# Patient Record
Sex: Female | Born: 1960
Health system: Southern US, Community
[De-identification: ages and names within clinical notes are randomized; demographics above are authoritative.]

## PROBLEM LIST (undated history)

## (undated) DIAGNOSIS — Z808 Family history of malignant neoplasm of other organs or systems: Secondary | ICD-10-CM

## (undated) DIAGNOSIS — Z9889 Other specified postprocedural states: Secondary | ICD-10-CM

## (undated) DIAGNOSIS — R112 Nausea with vomiting, unspecified: Secondary | ICD-10-CM

## (undated) DIAGNOSIS — F32A Depression, unspecified: Secondary | ICD-10-CM

## (undated) DIAGNOSIS — E039 Hypothyroidism, unspecified: Secondary | ICD-10-CM

## (undated) DIAGNOSIS — Z923 Personal history of irradiation: Secondary | ICD-10-CM

## (undated) DIAGNOSIS — I499 Cardiac arrhythmia, unspecified: Secondary | ICD-10-CM

## (undated) DIAGNOSIS — E079 Disorder of thyroid, unspecified: Secondary | ICD-10-CM

## (undated) DIAGNOSIS — Z803 Family history of malignant neoplasm of breast: Secondary | ICD-10-CM

## (undated) DIAGNOSIS — F419 Anxiety disorder, unspecified: Secondary | ICD-10-CM

## (undated) DIAGNOSIS — O009 Unspecified ectopic pregnancy without intrauterine pregnancy: Secondary | ICD-10-CM

## (undated) HISTORY — PX: ENDOMETRIAL ABLATION: SHX621

## (undated) HISTORY — DX: Family history of malignant neoplasm of breast: Z80.3

## (undated) HISTORY — PX: APPENDECTOMY: SHX54

## (undated) HISTORY — PX: BILATERAL SALPINGECTOMY: SHX5743

## (undated) HISTORY — DX: Family history of malignant neoplasm of other organs or systems: Z80.8

## (undated) HISTORY — PX: GLAUCOMA SURGERY: SHX656

## (undated) HISTORY — DX: Disorder of thyroid, unspecified: E07.9

## (undated) HISTORY — PX: EYE SURGERY: SHX253

---

## 1988-08-28 HISTORY — PX: BREAST EXCISIONAL BIOPSY: SUR124

## 1991-08-29 HISTORY — PX: BREAST SURGERY: SHX581

## 2012-08-28 HISTORY — PX: BREAST BIOPSY: SHX20

## 2017-04-04 ENCOUNTER — Encounter: Payer: Self-pay | Admitting: Family Medicine

## 2017-04-04 ENCOUNTER — Ambulatory Visit (INDEPENDENT_AMBULATORY_CARE_PROVIDER_SITE_OTHER): Payer: 59 | Admitting: Family Medicine

## 2017-04-04 VITALS — BP 142/76 | HR 88 | Temp 98.7°F | Ht 63.5 in | Wt 117.2 lb

## 2017-04-04 DIAGNOSIS — Z720 Tobacco use: Secondary | ICD-10-CM

## 2017-04-04 DIAGNOSIS — N951 Menopausal and female climacteric states: Secondary | ICD-10-CM | POA: Diagnosis not present

## 2017-04-04 DIAGNOSIS — Z7689 Persons encountering health services in other specified circumstances: Secondary | ICD-10-CM

## 2017-04-04 DIAGNOSIS — Z23 Encounter for immunization: Secondary | ICD-10-CM

## 2017-04-04 DIAGNOSIS — F1010 Alcohol abuse, uncomplicated: Secondary | ICD-10-CM | POA: Diagnosis not present

## 2017-04-04 DIAGNOSIS — F17200 Nicotine dependence, unspecified, uncomplicated: Secondary | ICD-10-CM | POA: Insufficient documentation

## 2017-04-04 DIAGNOSIS — E039 Hypothyroidism, unspecified: Secondary | ICD-10-CM | POA: Insufficient documentation

## 2017-04-04 NOTE — Patient Instructions (Addendum)
It was a pleasure to meet you today! I look forward to partnering with you for your health care needs   [facility]  [facility] in [place]  Follow up for your annual exam in February.

## 2017-04-04 NOTE — Progress Notes (Signed)
Subjective:    Patient ID: Martha [name], female    DOB: 1960-10-12, 55 y.o.   MRN: [id number]  HPI This is a 56 yo female who presents today to establish care. Her mother is a patient as well.  Had colonoscopy ([facility]) 2012- normal. She remarried this spring and moved from [place] to [place]. Her mother moved in with her last month. She feels like it is going well. She loves to cook, arts and crafts, yoga.   Menopausal symptoms fairly well controlled on current meds.   Smokes <1 ppd, had stopped for 4 years. Drinks 4-6 Michel[employer] Ultra.   Past Medical History:  Diagnosis Date  . Heart murmur   . Thyroid disease    Past Surgical History:  Procedure Laterality Date  . APPENDECTOMY    . BREAST SURGERY    . EYE SURGERY     Family History  Problem Relation Age of Onset  . Heart disease Mother   . Hyperlipidemia Sister    Social History  Substance Use Topics  . Smoking status: Current Every Day Smoker  . Smokeless tobacco: Never Used  . Alcohol use Yes     Comment: 30 drinks per week        Review of Systems  Constitutional: Negative for unexpected weight change.  Respiratory: Negative for cough, chest tightness and shortness of breath.   Cardiovascular: Negative for chest pain, palpitations and leg swelling.  Genitourinary: Negative for vaginal bleeding.  Psychiatric/Behavioral: Negative for dysphoric mood and sleep disturbance. The patient is not nervous/anxious.        Objective:   Physical Exam Physical Exam  Constitutional: Oriented to person, place, and time. She appears well-developed and well-nourished.  HENT:  Head: Normocephalic and atraumatic.  Eyes: Conjunctivae are normal.  Neck: Normal range of motion. Neck supple.  Cardiovascular: Normal rate, regular rhythm and normal heart sounds.   Pulmonary/Chest: Effort normal and breath sounds normal.  Musculoskeletal: Normal range of motion. No edema.  Neurological: Alert and oriented to person,  place, and time.  Skin: Skin is warm and dry.  Psychiatric: Normal mood and affect. Behavior is normal. Judgment and thought content normal.  Vitals reviewed.    BP (!) 148/86 (BP Location: Right Arm, Patient Position: Sitting, Cuff Size: Normal)   Pulse 88   Temp 98.7 F (37.1 C) (Oral)   Ht 5' 3.5" (1.613 m)   Wt 117 lb 3.2 oz (53.2 kg)   SpO2 98%   BMI 20.44 kg/m   Recheck bp- 142/76  Depression screen PHQ 2/9 04/04/2017  Decreased Interest 0  Down, Depressed, Hopeless 0  PHQ - 2 Score 0       Assessment & Plan:  1. Encounter to establish care - Discussed and encouraged healthy lifestyle choices- adequate sleep, regular exercise, stress management and healthy food choices.  - she will get Shingrix through her pharmacy  2. Need for Tdap vaccination - Tdap vaccine greater than or equal to 7yo IM  3. Menopausal symptoms - currently well controlled on paxil and combipatch  4. Hypothyroidism, unspecified type - had TSH checked 2/18, normal per patient, stay on same dose of synthroid  5. Tobacco abuse - discussed smoking cessation and she is contemplating stopping smoking again - encouraged her to stop  6. Excessive drinking of alcohol - discussed recommended daily intake of alcohol of 1 per day (if she is drinking low alcohol beer, can have 2), encouraged her to not exceed recommended amount and discussed possible risks  of excessive intake (liver disease, poor sleep)  - follow up in 6 months for CPE  Clarene Reamer, FNP-BC  [facility] [place], [place]  04/04/2017 9:23 AM

## 2017-06-04 ENCOUNTER — Ambulatory Visit (INDEPENDENT_AMBULATORY_CARE_PROVIDER_SITE_OTHER): Payer: 59 | Admitting: *Deleted

## 2017-06-04 DIAGNOSIS — Z23 Encounter for immunization: Secondary | ICD-10-CM | POA: Diagnosis not present

## 2017-08-28 DIAGNOSIS — C801 Malignant (primary) neoplasm, unspecified: Secondary | ICD-10-CM

## 2017-08-28 HISTORY — DX: Malignant (primary) neoplasm, unspecified: C80.1

## 2017-09-26 DIAGNOSIS — H40033 Anatomical narrow angle, bilateral: Secondary | ICD-10-CM | POA: Diagnosis not present

## 2017-09-26 DIAGNOSIS — H2513 Age-related nuclear cataract, bilateral: Secondary | ICD-10-CM | POA: Diagnosis not present

## 2017-10-02 ENCOUNTER — Encounter: Payer: Self-pay | Admitting: Family Medicine

## 2017-10-02 NOTE — Telephone Encounter (Signed)
Pt has not had any recent labs. pls advise

## 2017-10-03 ENCOUNTER — Other Ambulatory Visit: Payer: Self-pay | Admitting: Family Medicine

## 2017-10-03 MED ORDER — LEVOTHYROXINE SODIUM 75 MCG PO TABS
75.0000 ug | ORAL_TABLET | Freq: Every day | ORAL | 0 refills | Status: DC
Start: 2017-10-03 — End: 2018-04-23

## 2017-10-11 ENCOUNTER — Ambulatory Visit: Payer: 59 | Admitting: Family Medicine

## 2017-10-22 ENCOUNTER — Encounter: Payer: Self-pay | Admitting: Family Medicine

## 2017-10-22 ENCOUNTER — Ambulatory Visit (INDEPENDENT_AMBULATORY_CARE_PROVIDER_SITE_OTHER): Payer: 59 | Admitting: Family Medicine

## 2017-10-22 VITALS — BP 128/74 | HR 87 | Temp 98.6°F | Ht 63.5 in | Wt 125.8 lb

## 2017-10-22 DIAGNOSIS — E039 Hypothyroidism, unspecified: Secondary | ICD-10-CM | POA: Diagnosis not present

## 2017-10-22 DIAGNOSIS — Z1159 Encounter for screening for other viral diseases: Secondary | ICD-10-CM

## 2017-10-22 DIAGNOSIS — Z114 Encounter for screening for human immunodeficiency virus [HIV]: Secondary | ICD-10-CM | POA: Diagnosis not present

## 2017-10-22 DIAGNOSIS — F17200 Nicotine dependence, unspecified, uncomplicated: Secondary | ICD-10-CM

## 2017-10-22 DIAGNOSIS — N951 Menopausal and female climacteric states: Secondary | ICD-10-CM

## 2017-10-22 LAB — TSH: TSH: 0.79 u[IU]/mL (ref 0.35–4.50)

## 2017-10-22 NOTE — Progress Notes (Signed)
   Subjective:  Martha [name] is a 57 y.o. female who presents today with a chief complaint of hypothyroidism.   HPI:  Hypothyroidism, established problem, stable Currently on Synthroid 75 mcg daily.  Tolerates this dose well without side effects.  No hair or skin changes.  No constipation or diarrhea.  Menopausal symptoms, established problem, stable Currently on Paxil.  Recently stopped hormone replacement.  Symptoms are stable.  No obvious side effects from Paxil.  Nicotine dependence, established problem Smokes about half a pack per day.  She has tried quitting in the past.  She has successfully quit cold [place] before.  She has tried Chantix which did not work well.  ROS: Per HPI  Objective:  Physical Exam: BP 128/74 (BP Location: Left Arm, Patient Position: Sitting, Cuff Size: Normal)   Pulse 87   Temp 98.6 F (37 C) (Oral)   Ht 5' 3.5" (1.613 m)   Wt 125 lb 12.8 oz (57.1 kg)   SpO2 99%   BMI 21.93 kg/m   Gen: NAD, resting comfortably CV: RRR with no murmurs appreciated Pulm: NWOB, CTAB with no crackles, wheezes, or rhonchi  Assessment/Plan:  Hypothyroidism Continue Synthroid.  Check TSH today.  Menopausal symptom Continue Paxil.  Would avoid hormone replacement given her age and smoking status.  Nicotine dependence with current use Patient was asked about her tobacco use today and was strongly advised to quit. Patient is currently contemplative. We reviewed treatment options to assist her quit smoking including NRT, Chantix, and Bupropion.  She declined pharmacotherapy today.  Follow up at next office visit.  Total time spent counseling approximately 4 minutes.   Preventative healthcare Check HIV and hepatitis C antibodies today.  [name]. [name], MD 10/22/2017 11:02 AM

## 2017-10-22 NOTE — Assessment & Plan Note (Signed)
Patient was asked about her tobacco use today and was strongly advised to quit. Patient is currently contemplative. We reviewed treatment options to assist her quit smoking including NRT, Chantix, and Bupropion.  She declined pharmacotherapy today.  Follow up at next office visit.  Total time spent counseling approximately 4 minutes.

## 2017-10-22 NOTE — Assessment & Plan Note (Signed)
Continue Synthroid. Check TSH today. 

## 2017-10-22 NOTE — Patient Instructions (Signed)
We will check blood work today. We will not make any medication changes today.  Please let me know if you would like to start wellbutrin to help you stop smoking.  I would like to see you at least once yearly. Please bring the results of your latest blood work when you are able.  Take care, Dr [name]

## 2017-10-22 NOTE — Assessment & Plan Note (Signed)
Continue Paxil.  Would avoid hormone replacement given her age and smoking status.

## 2017-10-23 LAB — HEPATITIS C ANTIBODY
Hepatitis C Ab: NONREACTIVE
SIGNAL TO CUT-OFF: 0.01 (ref <1.00)

## 2017-10-23 LAB — HIV ANTIBODY (ROUTINE TESTING W REFLEX): HIV 1&2 Ab, 4th Generation: NONREACTIVE

## 2017-10-23 NOTE — Progress Notes (Signed)
Dr Marigene Ehlers interpretation of your lab work:  Your thyroid level is normal. We do not need to make any medication changes. Your HIV and hepatitis C tests were negative.    If you have any additional questions, please give [place] a call or send [place] a message through [place].  Take care, Dr [name]

## 2017-12-04 ENCOUNTER — Encounter: Payer: Self-pay | Admitting: Family Medicine

## 2017-12-05 ENCOUNTER — Other Ambulatory Visit: Payer: Self-pay | Admitting: Family Medicine

## 2017-12-05 ENCOUNTER — Other Ambulatory Visit: Payer: Self-pay

## 2017-12-05 DIAGNOSIS — N63 Unspecified lump in unspecified breast: Secondary | ICD-10-CM

## 2017-12-26 ENCOUNTER — Other Ambulatory Visit: Payer: 59

## 2018-01-15 ENCOUNTER — Ambulatory Visit
Admission: RE | Admit: 2018-01-15 | Discharge: 2018-01-15 | Disposition: A | Discharge status: Home / Self Care | Payer: 59 | Source: Ambulatory Visit | Attending: Family Medicine | Admitting: Family Medicine

## 2018-01-15 ENCOUNTER — Other Ambulatory Visit: Payer: Self-pay | Admitting: Family Medicine

## 2018-01-15 DIAGNOSIS — N63 Unspecified lump in unspecified breast: Secondary | ICD-10-CM

## 2018-01-15 DIAGNOSIS — R921 Mammographic calcification found on diagnostic imaging of breast: Secondary | ICD-10-CM

## 2018-01-15 DIAGNOSIS — R922 Inconclusive mammogram: Secondary | ICD-10-CM | POA: Diagnosis not present

## 2018-01-15 DIAGNOSIS — N6324 Unspecified lump in the left breast, lower inner quadrant: Secondary | ICD-10-CM | POA: Diagnosis not present

## 2018-01-15 IMAGING — MG DIGITAL DIAGNOSTIC BILATERAL MAMMOGRAM WITH TOMO AND CAD
8 of 16 series · 8 of 40 positions shown · non-contrast
Comparison: Previous exam(s).

ADDENDUM:
For addition of recommendations:

Recommend breast MRI for further evaluation of the left breast after
biopsy of the above recommended areas.
CLINICAL DATA: Patient presents for palpable abnormality within the
anterior left breast.
EXAM:
DIGITAL DIAGNOSTIC BILATERAL MAMMOGRAM WITH CAD AND TOMO
ULTRASOUND LEFT BREAST

[L ML (1 of 2)]
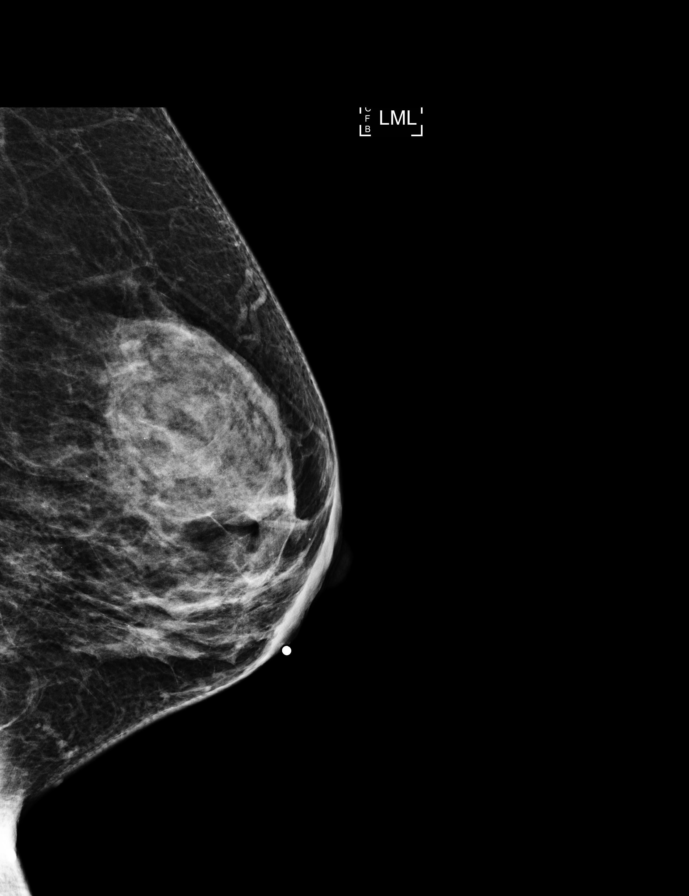

[L CC]
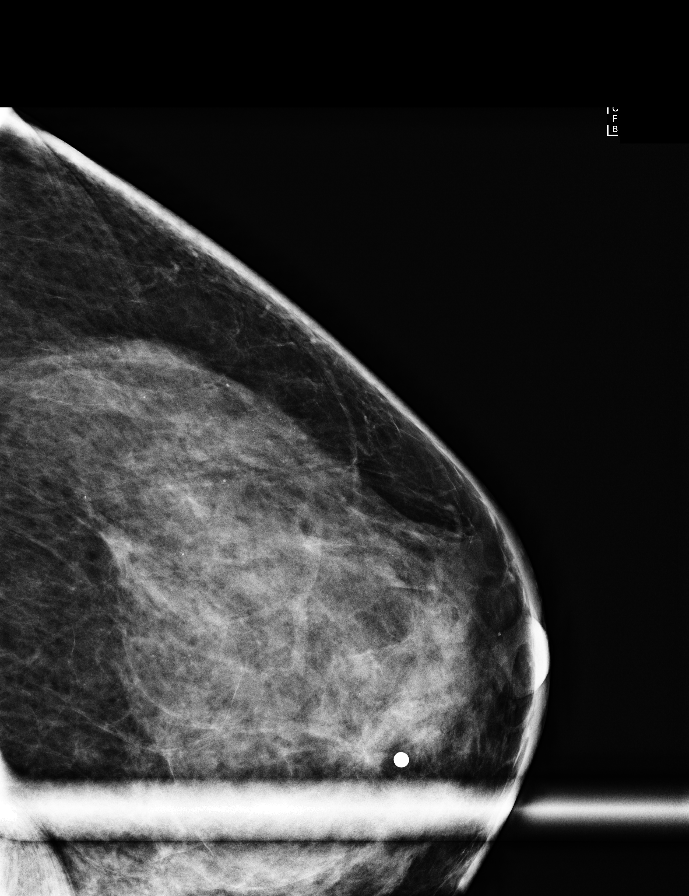

[L ML (2 of 2)]
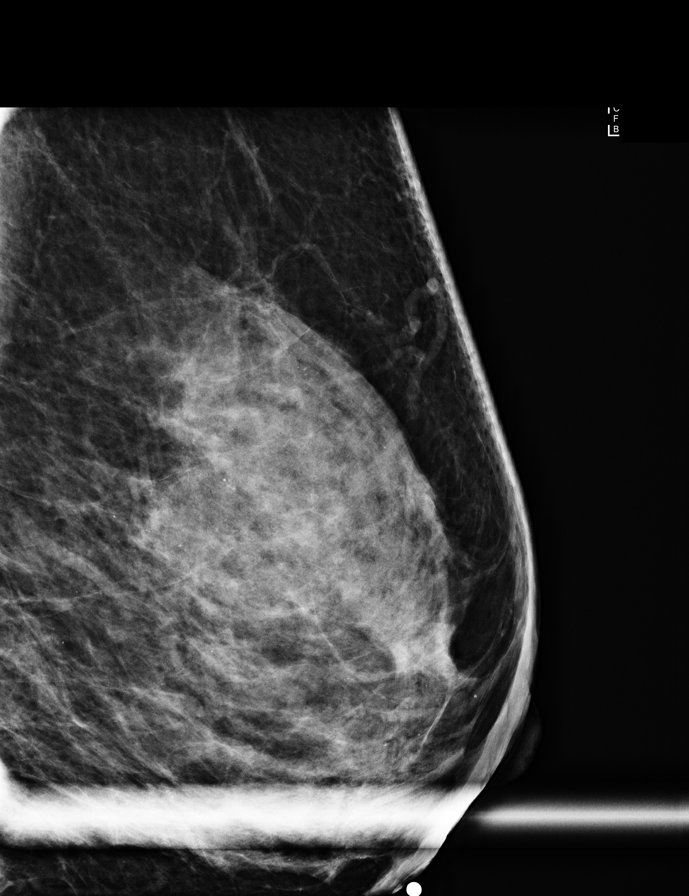

[R MLO synth-2D]
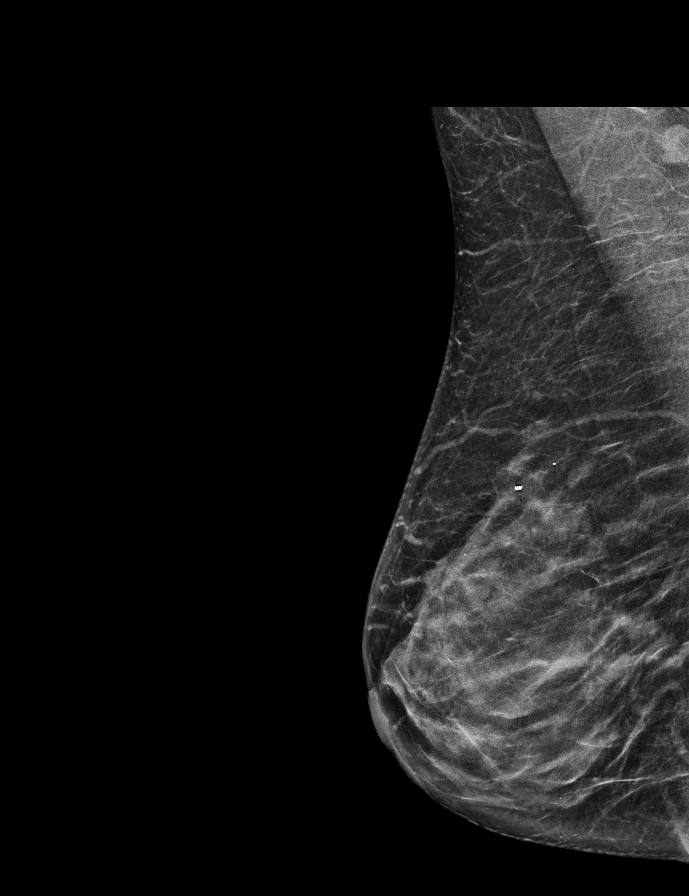

[L CC synth-2D (1 of 2)]
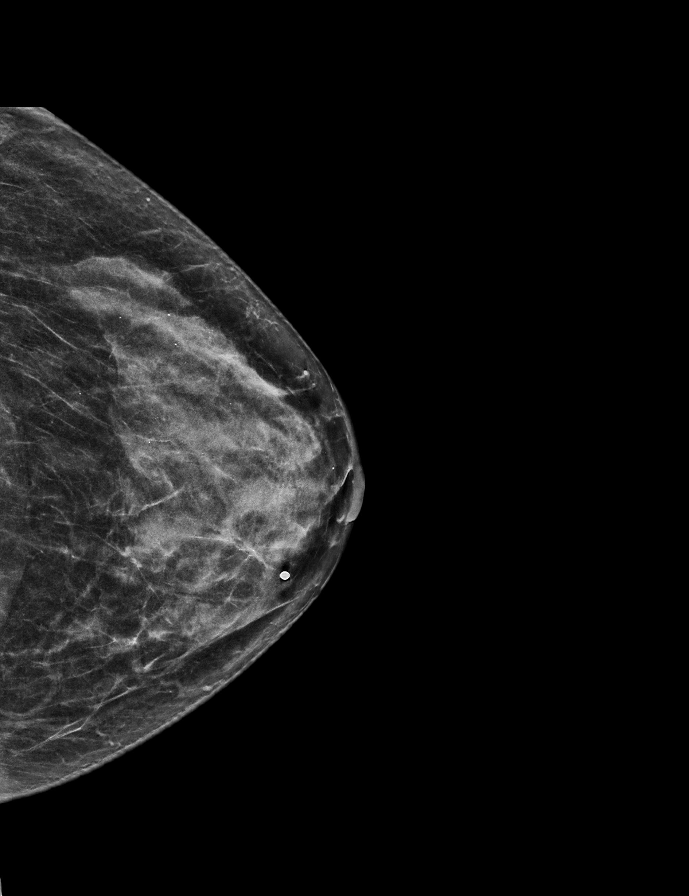

[L CC synth-2D (2 of 2)]
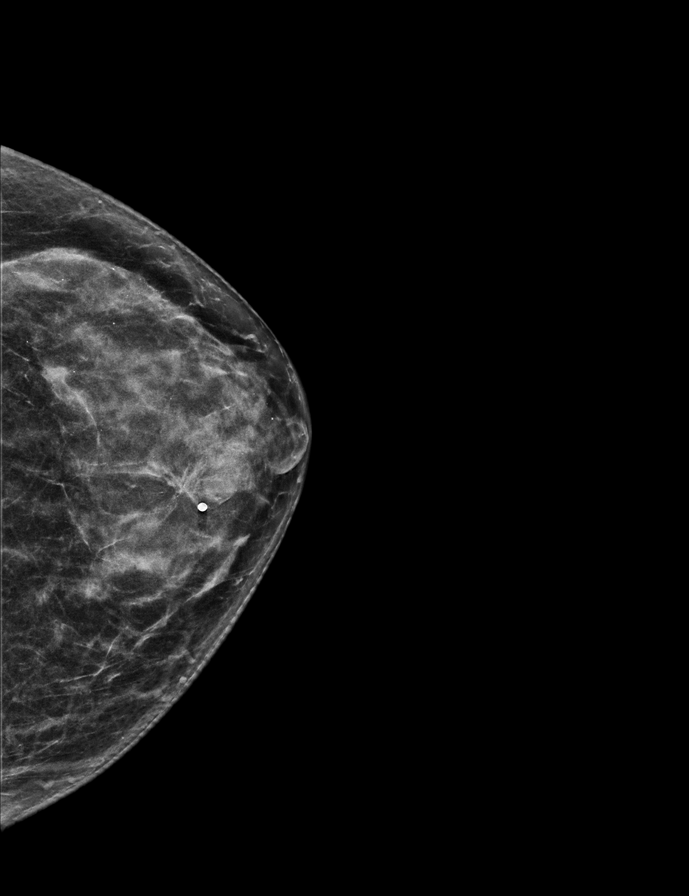

[L TAN synth-2D]
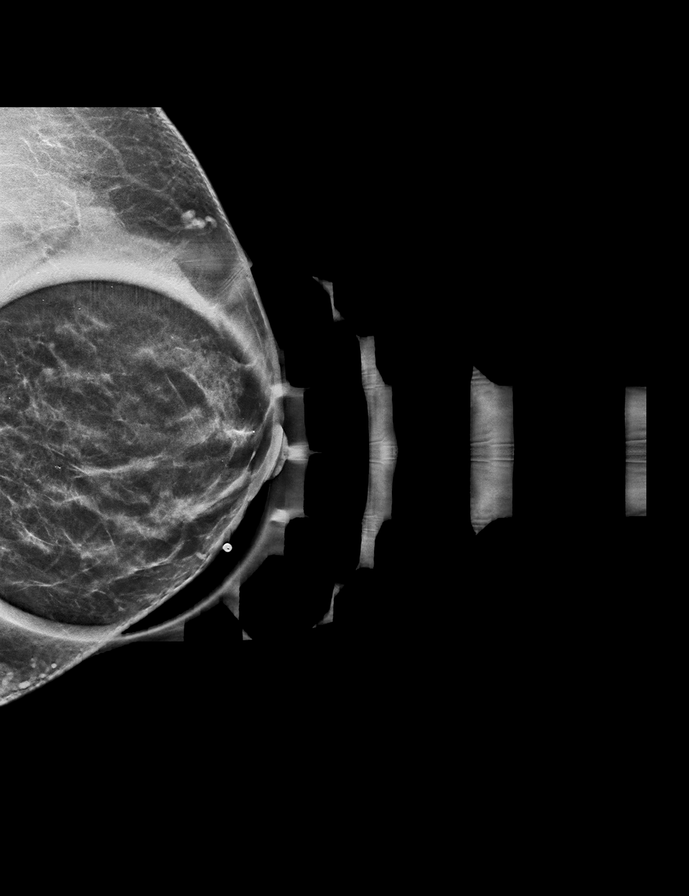

[R CC synth-2D]
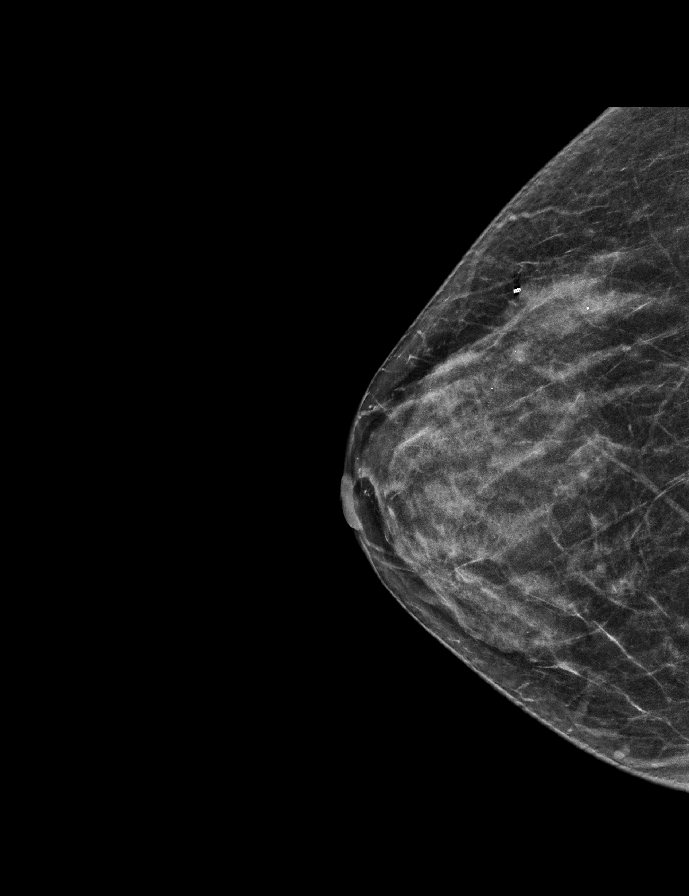

[8 of 40 positions shown; findings below may reference images not displayed]

ACR Breast Density Category c: The breast tissue is heterogeneously
dense, which may obscure small masses.
FINDINGS: Underlying the palpable marker within the anterior slightly medial
left breast is a focal area of distortion. Within the lateral aspect
of the left breast there is a 7 mm group of linearly oriented fine
punctate calcifications, these are best demonstrated on the CC view.
There are additional scattered loosely grouped calcifications
throughout the left and right breast.

Mammographic images were processed with CAD.

On physical exam, I palpate a firm mass within the left breast 7
o'clock position.

Targeted ultrasound is performed, showing a 1.2 x 1.4 x 1.3 cm mixed
echogenicity mass left breast 7 o'clock position retroareolar
location at the site of palpable concern.

Within the left breast 7:30 o'clock retroareolar location, adjacent
to the palpable mass, is a 0.5 x 0.4 x 0.5 cm oval circumscribed
hypoechoic mass.

No left axillary adenopathy.
IMPRESSION: 1. Suspicious mass left breast 7 o'clock position corresponding with
palpable abnormality.
2. Indeterminate hypoechoic left breast mass 7:30 o'clock,
potentially representing a complicated cyst.
3. Indeterminate calcifications within the lateral left breast.
Location of these calcifications is not definitely confirmed on the
true lateral view.

RECOMMENDATION:
1. Ultrasound-guided core needle biopsy suspicious left breast mass
7 o'clock position.
2. Ultrasound-guided aspiration versus core needle biopsy left
breast mass 7:30 o'clock.
3. Stereotactic guided core needle biopsy indeterminate left breast
calcifications laterally. Recommend biopsy from a CC approach as the
exact location on the true lateral view is indeterminate.

I have discussed the findings and recommendations with the patient.
Results were also provided in writing at the conclusion of the
visit. If applicable, a reminder letter will be sent to the patient
regarding the next appointment.

BI-RADS CATEGORY  4: Suspicious.

## 2018-01-16 ENCOUNTER — Ambulatory Visit
Admission: RE | Admit: 2018-01-16 | Discharge: 2018-01-16 | Disposition: A | Discharge status: Home / Self Care | Payer: 59 | Source: Ambulatory Visit | Attending: Family Medicine | Admitting: Family Medicine

## 2018-01-16 DIAGNOSIS — N63 Unspecified lump in unspecified breast: Secondary | ICD-10-CM

## 2018-01-16 DIAGNOSIS — C50312 Malignant neoplasm of lower-inner quadrant of left female breast: Secondary | ICD-10-CM | POA: Diagnosis not present

## 2018-01-16 DIAGNOSIS — N6342 Unspecified lump in left breast, subareolar: Secondary | ICD-10-CM | POA: Diagnosis not present

## 2018-01-16 DIAGNOSIS — N6012 Diffuse cystic mastopathy of left breast: Secondary | ICD-10-CM | POA: Diagnosis not present

## 2018-01-16 DIAGNOSIS — R921 Mammographic calcification found on diagnostic imaging of breast: Secondary | ICD-10-CM

## 2018-01-16 HISTORY — PX: BREAST BIOPSY: SHX20

## 2018-01-18 ENCOUNTER — Other Ambulatory Visit: Payer: Self-pay | Admitting: Family Medicine

## 2018-01-18 ENCOUNTER — Telehealth: Payer: Self-pay | Admitting: Hematology

## 2018-01-18 ENCOUNTER — Other Ambulatory Visit: Payer: Self-pay

## 2018-01-18 ENCOUNTER — Encounter: Payer: Self-pay | Admitting: *Deleted

## 2018-01-18 DIAGNOSIS — R921 Mammographic calcification found on diagnostic imaging of breast: Secondary | ICD-10-CM

## 2018-01-18 NOTE — Telephone Encounter (Signed)
Spoke with patient to confirm afternoon [facility] appointment on 5/29, packet emailed to patient

## 2018-01-22 ENCOUNTER — Other Ambulatory Visit: Payer: Self-pay | Admitting: Family Medicine

## 2018-01-22 ENCOUNTER — Other Ambulatory Visit: Payer: Self-pay | Admitting: *Deleted

## 2018-01-22 ENCOUNTER — Ambulatory Visit
Admission: RE | Admit: 2018-01-22 | Discharge: 2018-01-22 | Disposition: A | Discharge status: Home / Self Care | Payer: 59 | Source: Ambulatory Visit | Attending: Family Medicine | Admitting: Family Medicine

## 2018-01-22 ENCOUNTER — Other Ambulatory Visit: Payer: Self-pay | Admitting: General Surgery

## 2018-01-22 DIAGNOSIS — R921 Mammographic calcification found on diagnostic imaging of breast: Secondary | ICD-10-CM

## 2018-01-22 DIAGNOSIS — C50312 Malignant neoplasm of lower-inner quadrant of left female breast: Secondary | ICD-10-CM

## 2018-01-22 DIAGNOSIS — Z170 Estrogen receptor positive status [ER+]: Principal | ICD-10-CM

## 2018-01-22 DIAGNOSIS — D242 Benign neoplasm of left breast: Secondary | ICD-10-CM | POA: Diagnosis not present

## 2018-01-22 HISTORY — PX: BREAST LUMPECTOMY: SHX2

## 2018-01-22 IMAGING — MG STEREOTACTIC CORE NEEDLE BIOPSY
8 series · 8 of 20 positions shown · non-contrast
Comparison: Previous exams.

ADDENDUM:
Pathology revealed FIBROADENOMA WITH CALCIFICATIONS, FIBROCYSTIC
CHANGES WITH ADENOSIS AND CALCIFICATIONS of the Left breast, upper
outer (x clip). This was found to be concordant by Dr. PIATT
PIATT.

Pathology results were discussed with the patient by telephone. The
patient reported doing well after the biopsy with tenderness at the
site. Post biopsy instructions and care were reviewed and questions
were answered. The patient was encouraged to call The [REDACTED]
The patient has a recent diagnosis of left breast cancer and should
follow her outlined treatment plan. The patient was referred to [REDACTED] [REDACTED] at [REDACTED] on [DATE].
Pathology results reported by PIATT, RN on [DATE].
CLINICAL DATA: Patient presents for stereotactic guided core biopsy
of LEFT breast calcifications. Recent diagnosis of grade 1-2
invasive mammary carcinoma in the 7 o'clock location of the LEFT
breast.
EXAM:
LEFT BREAST STEREOTACTIC CORE NEEDLE BIOPSY

[L ML]
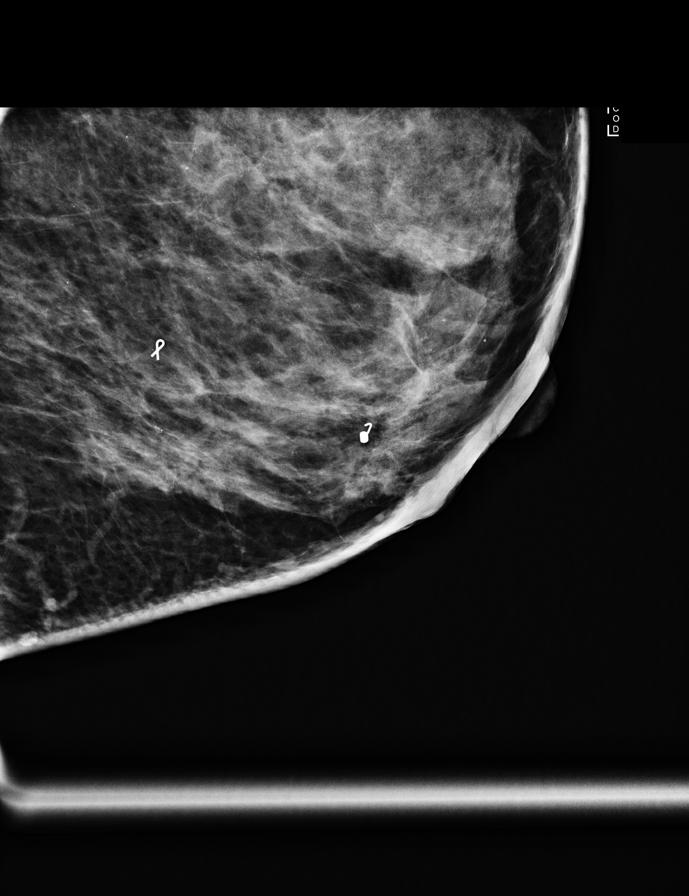

[L CC (1 of 4)]
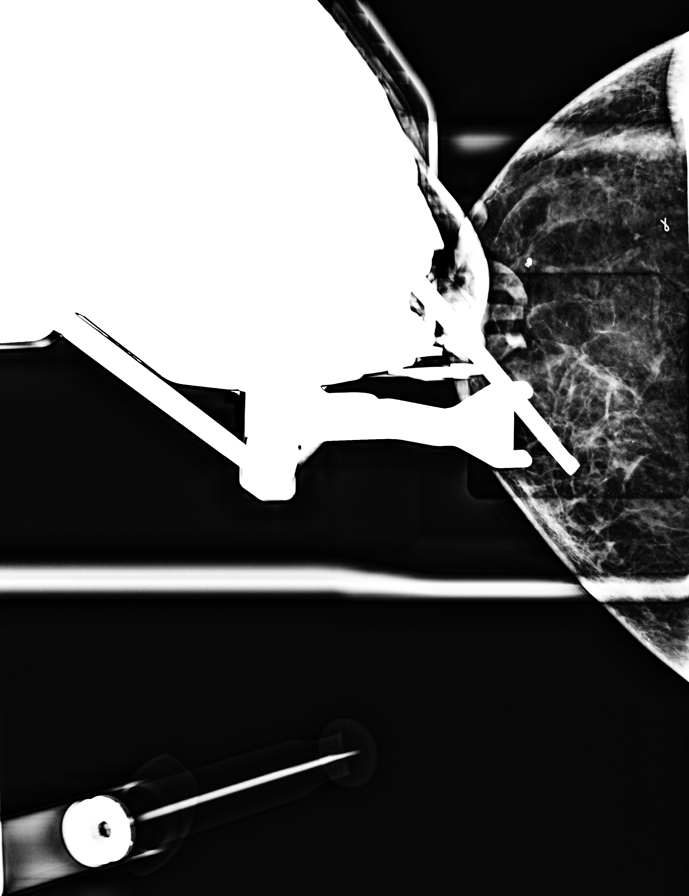

[L CC (2 of 4)]
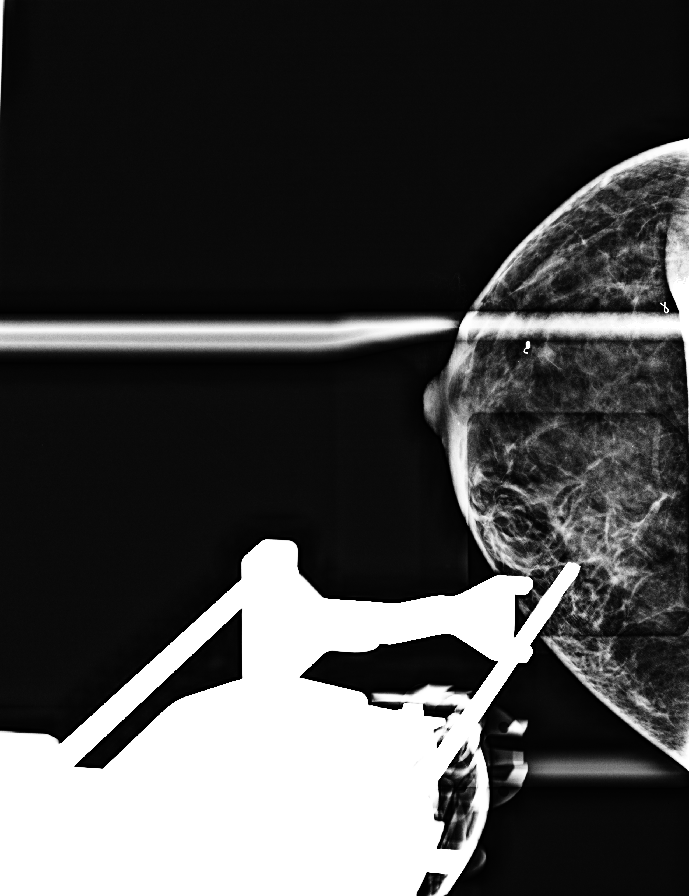

[L CC (3 of 4)]
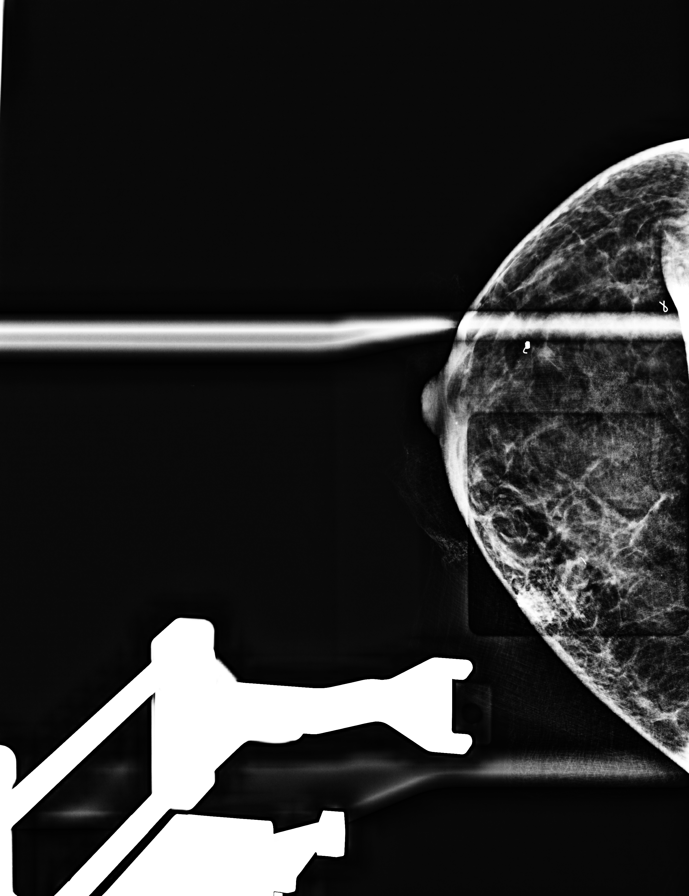

[L CC (4 of 4)]
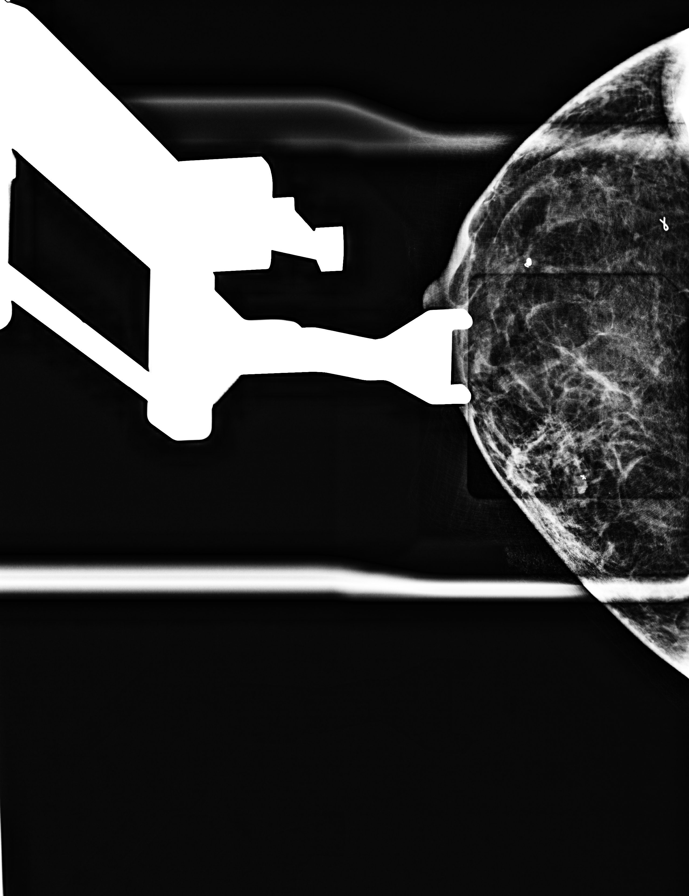

[L CC tomo (1 of 3) · tomo slice 26/51.0]
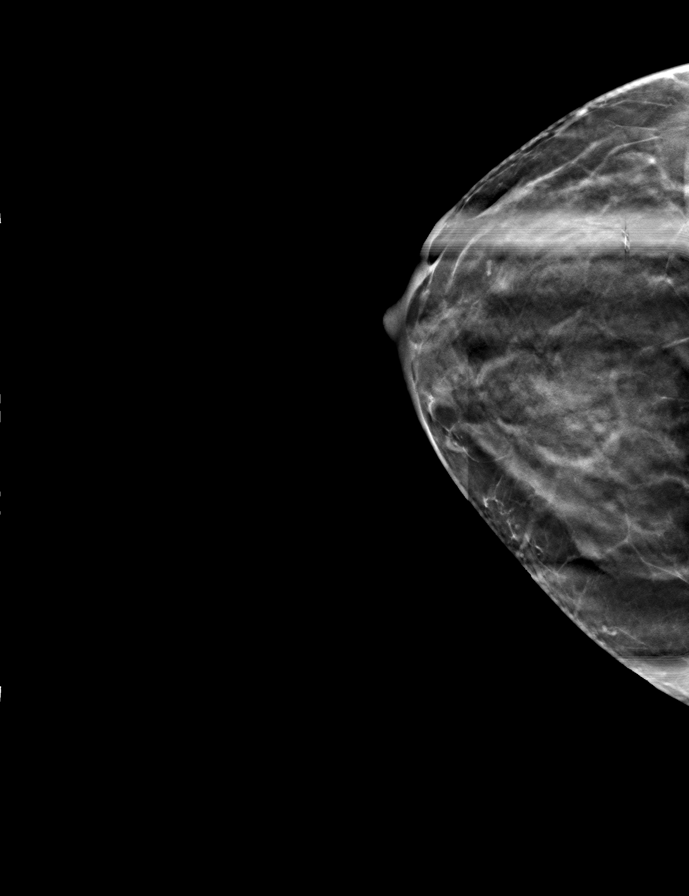

[L CC tomo (2 of 3) · tomo slice 28/55.0]
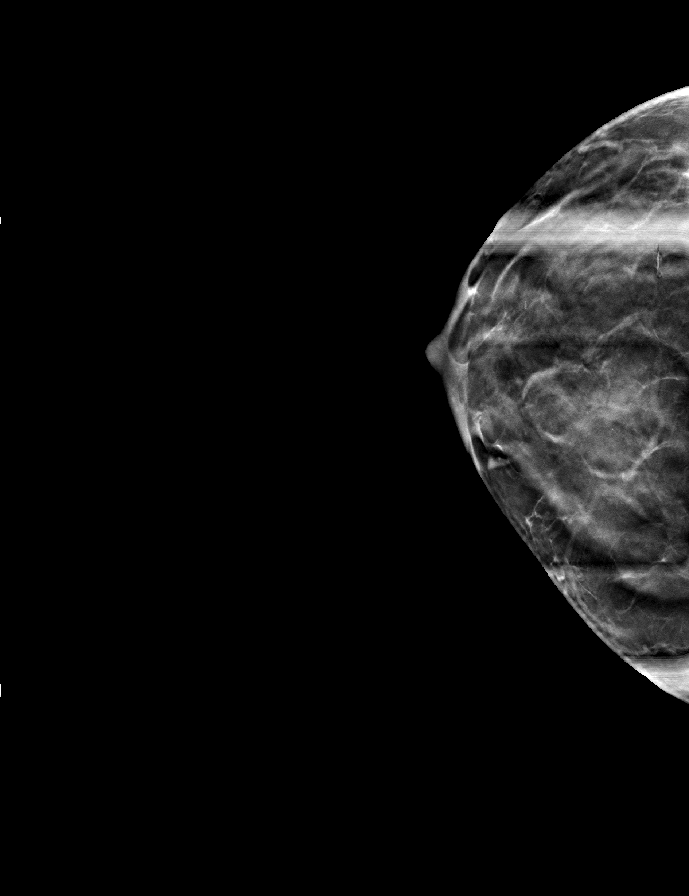

[L CC tomo (3 of 3) · tomo slice 27/54.0]
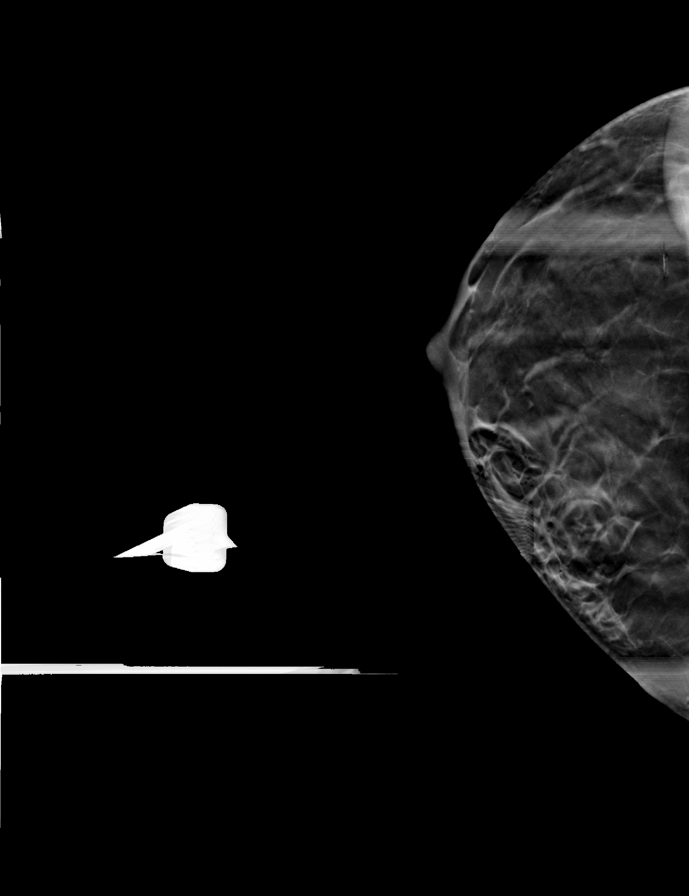

[8 of 20 positions shown; findings below may reference images not displayed]



Using sterile technique and 1% Lidocaine as local anesthetic, under
stereotactic guidance, a 9 gauge vacuum assisted device was used to
perform core needle biopsy of calcifications in the UPPER-OUTER
QUADRANT of the LEFT breast using a superior to inferior approach.
Specimen radiograph was performed showing calcifications in multiple
samples. Specimens with calcifications are identified for pathology.

Lesion quadrant: UPPER-OUTER QUADRANT LEFT breast

At the conclusion of the procedure, X shaped tissue marker clip was
deployed into the biopsy cavity. Follow-up 2-view mammogram was
performed and dictated separately.
IMPRESSION: Stereotactic-guided biopsy of LEFT breast calcifications. No
apparent complications.

## 2018-01-22 IMAGING — MG STEREOTACTIC CORE NEEDLE BIOPSY
3 series · 3 of 3 positions shown · non-contrast
Comparison: Previous exams.

ADDENDUM:
Pathology revealed FIBROADENOMA WITH CALCIFICATIONS, FIBROCYSTIC
CHANGES WITH ADENOSIS AND CALCIFICATIONS of the Left breast, upper
outer (x clip). This was found to be concordant by Dr. PIATT
PIATT.

Pathology results were discussed with the patient by telephone. The
patient reported doing well after the biopsy with tenderness at the
site. Post biopsy instructions and care were reviewed and questions
were answered. The patient was encouraged to call The [REDACTED]
The patient has a recent diagnosis of left breast cancer and should
follow her outlined treatment plan. The patient was referred to [REDACTED] [REDACTED] at [REDACTED] on [DATE].
Pathology results reported by PIATT, RN on [DATE].
CLINICAL DATA: Patient presents for stereotactic guided core biopsy
of LEFT breast calcifications. Recent diagnosis of grade 1-2
invasive mammary carcinoma in the 7 o'clock location of the LEFT
breast.
EXAM:
LEFT BREAST STEREOTACTIC CORE NEEDLE BIOPSY

[L (1 of 3)]
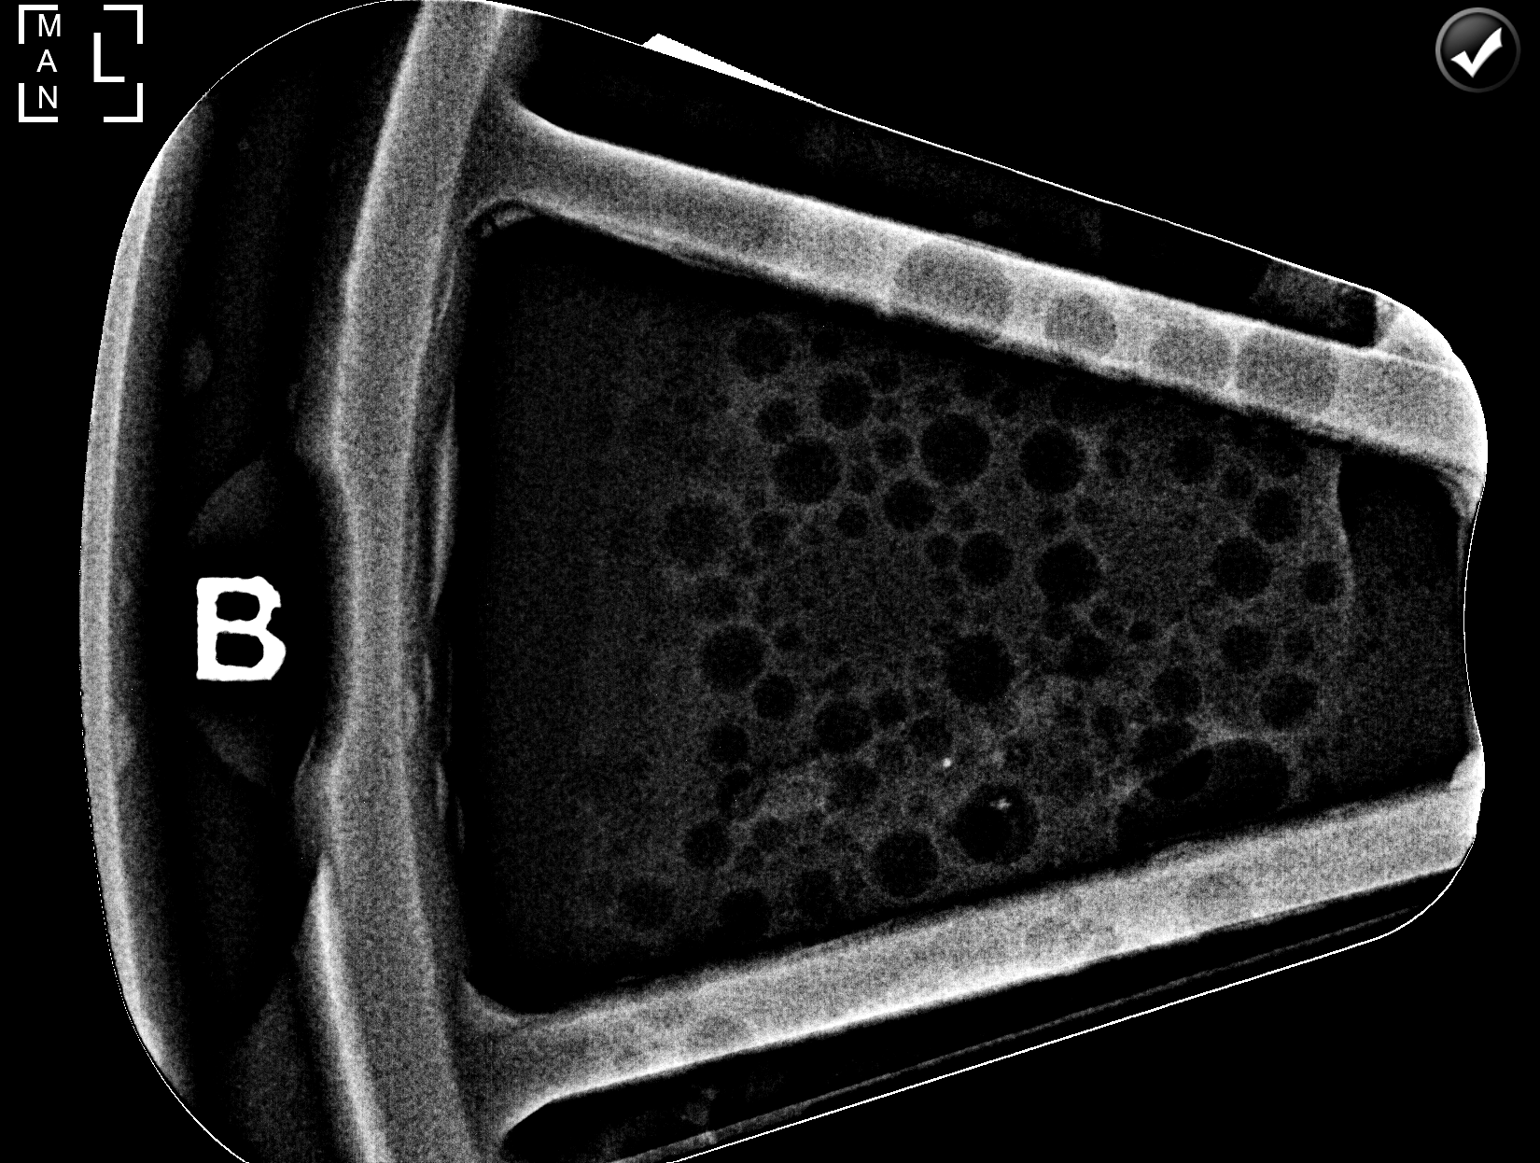

[L (2 of 3)]
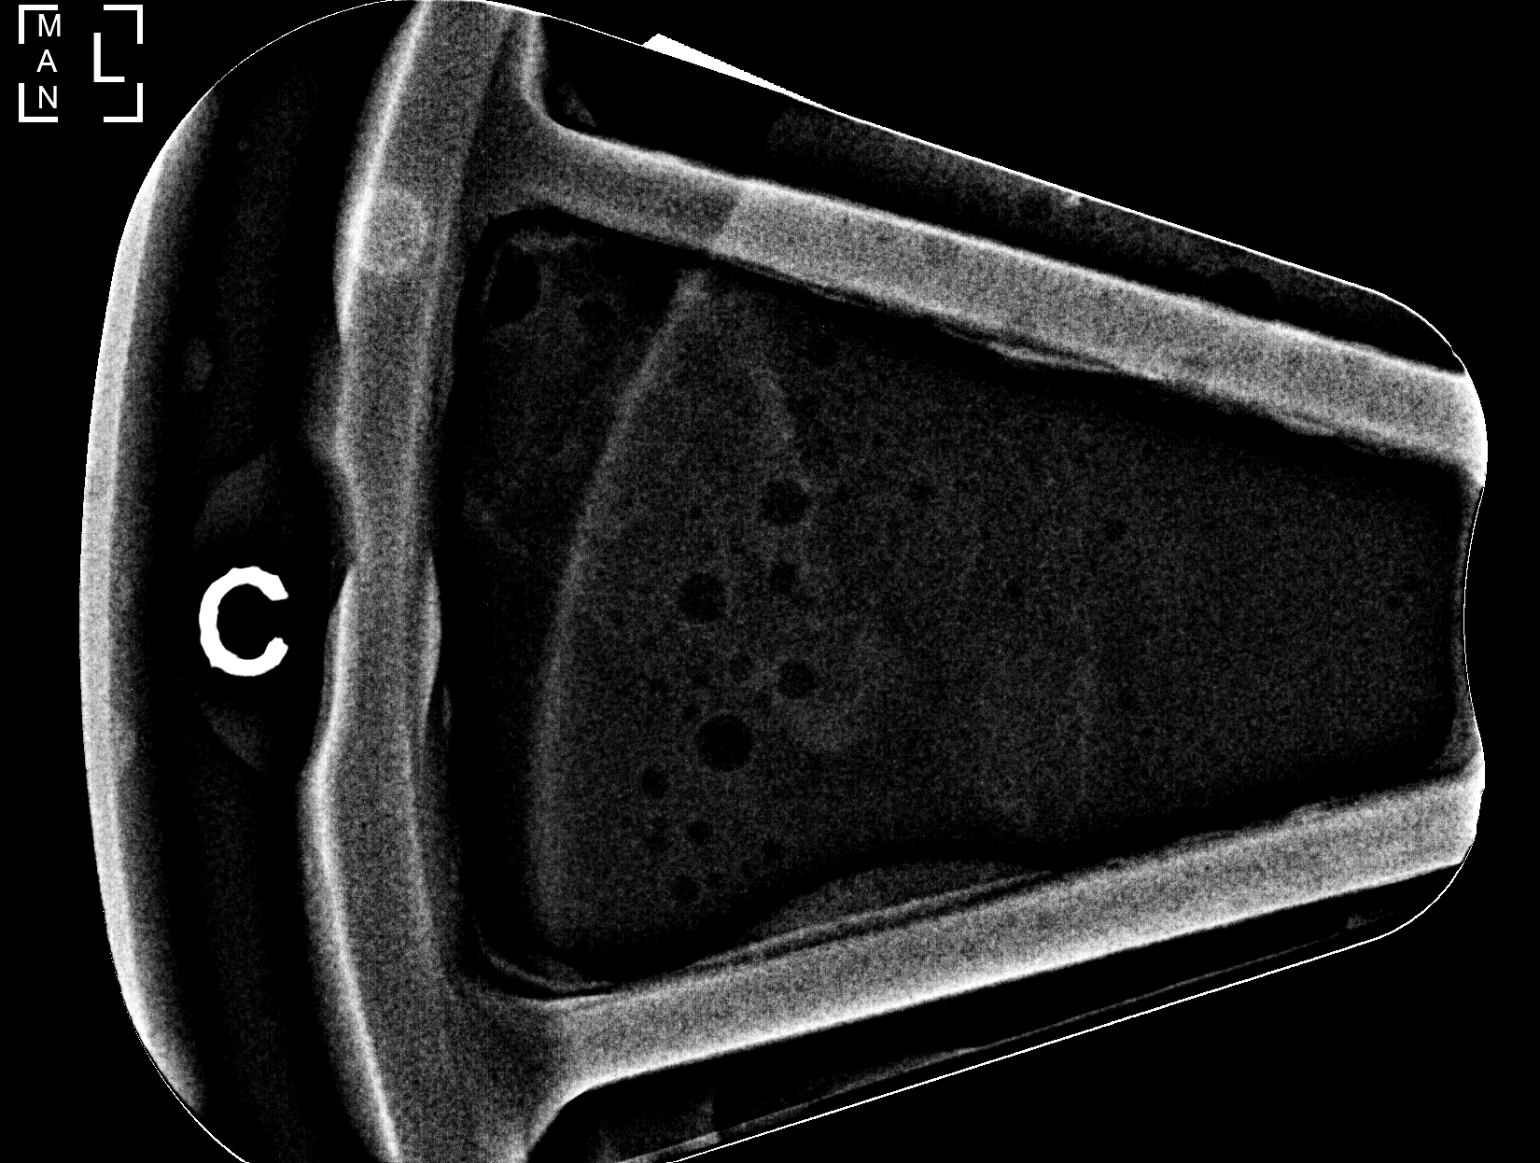

[L (3 of 3)]
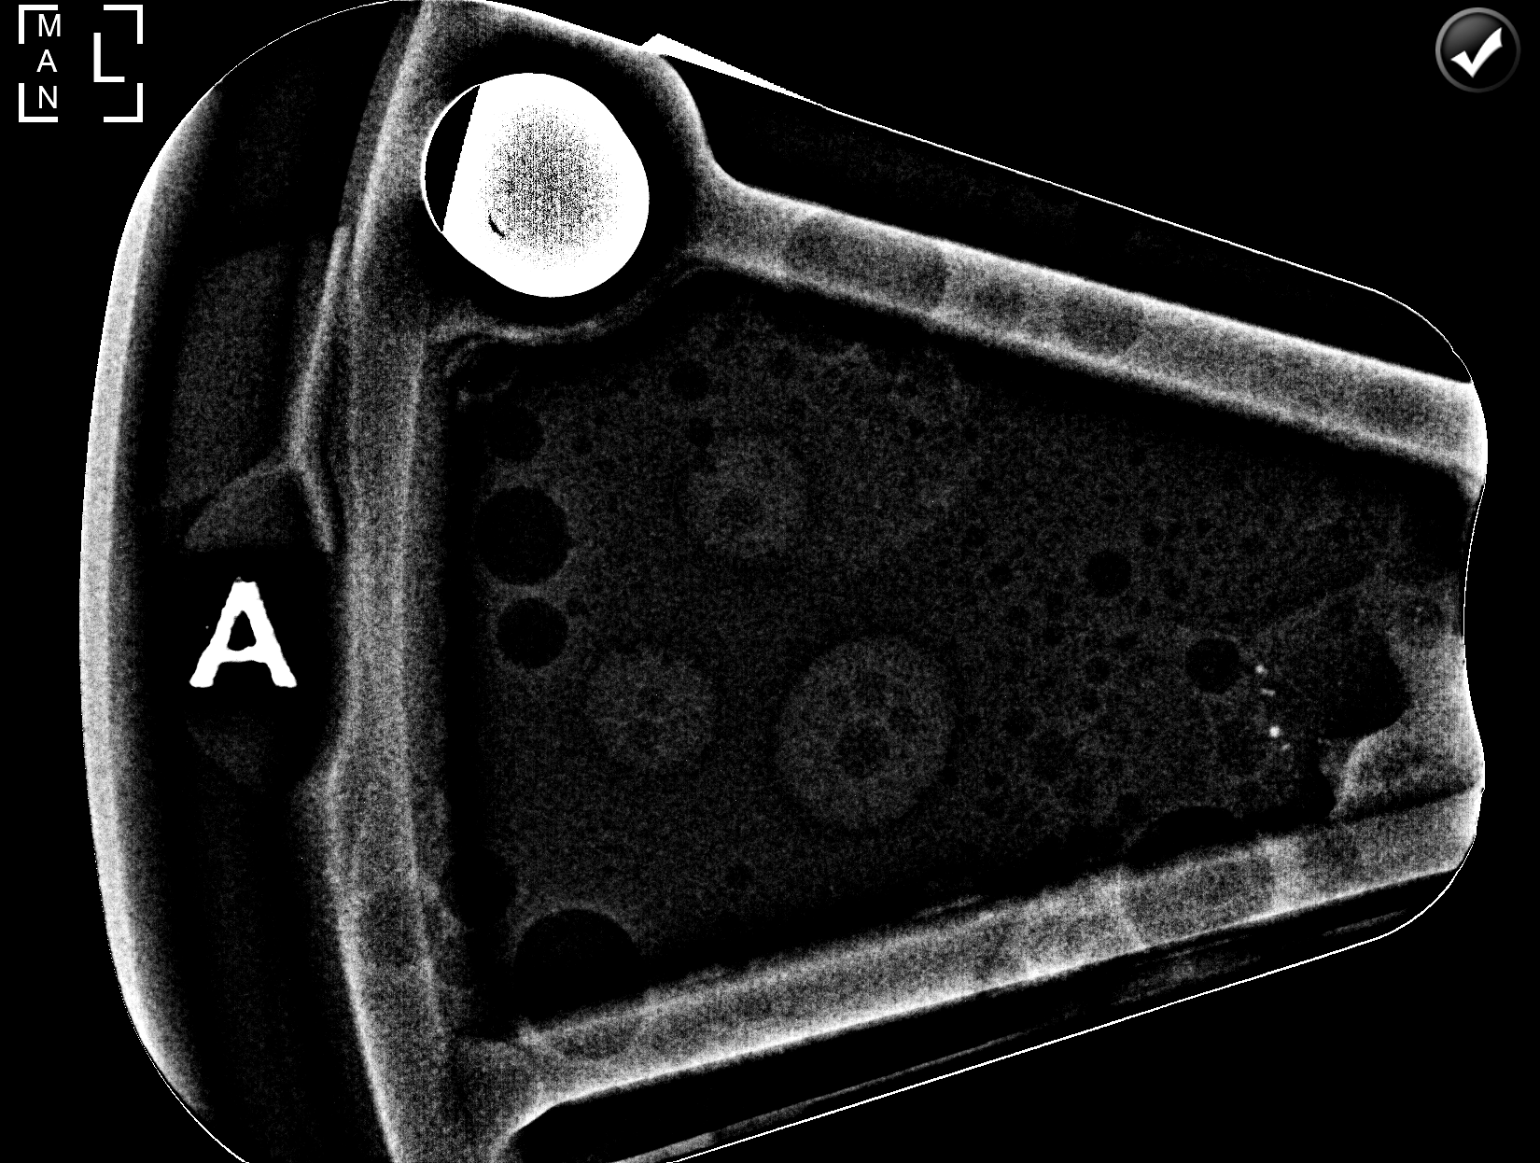

[3 of 3 positions shown; findings below may reference images not displayed]



Using sterile technique and 1% Lidocaine as local anesthetic, under
stereotactic guidance, a 9 gauge vacuum assisted device was used to
perform core needle biopsy of calcifications in the UPPER-OUTER
QUADRANT of the LEFT breast using a superior to inferior approach.
Specimen radiograph was performed showing calcifications in multiple
samples. Specimens with calcifications are identified for pathology.

Lesion quadrant: UPPER-OUTER QUADRANT LEFT breast

At the conclusion of the procedure, X shaped tissue marker clip was
deployed into the biopsy cavity. Follow-up 2-view mammogram was
performed and dictated separately.
IMPRESSION: Stereotactic-guided biopsy of LEFT breast calcifications. No
apparent complications.

## 2018-01-22 NOTE — Progress Notes (Signed)
[facility]  Telephone:([phone]) [phone] Fax:([phone]) [place] Note   Patient Care Team: [name], MD as PCP - General (Family Medicine) [name], MD as Consulting Physician (General Surgery) Truitt Merle, MD as Consulting Physician (Hematology) Eppie Gibson, MD as Attending Physician (Radiation Oncology)  Date of Service:  01/23/2018  CHIEF COMPLAINTS/PURPOSE OF CONSULTATION:  Malignant neoplasm of lower-inner quadrant of left breast    Oncology History   Cancer Staging Malignant neoplasm of lower-inner quadrant of left breast in female, estrogen receptor positive ([place]) Staging form: Breast, AJCC 8th Edition - Clinical stage from 01/16/2018: Stage IA (cT1c, cN0, cM0, G2, ER+, PR+, HER2-) - Signed by Truitt Merle, MD on 01/23/2018       Malignant neoplasm of lower-inner quadrant of left breast in female, estrogen receptor positive ([place])   01/15/2018 Mammogram    Diagnositc Mammogram 01/15/18  IMPRESSION: 1. Suspicious 1.2 x 1.4 x 1.3 cm mixed echogenicity mass left breast 7 o'clock position retroareolar location at the site of palpable concern.. 2. Indeterminate Within the left breast 7:30 o'clock retroareolar location, adjacent to the palpable mass, is a 0.5 x 0.4 x 0.5 cm oval circumscribed hypoechoic mass. 3. Indeterminate calcifications within the lateral left breast. Location of these calcifications is not definitely confirmed on the true lateral view.       01/16/2018 Initial Biopsy    Diagnosis 01/16/18 1. Breast, left, needle core biopsy, 7:30 o'clock (ribbon clip) - FIBROCYSTIC CHANGES WITH SCLEROSING ADENOSIS AND CALCIFICATIONS. - FIBROADENOMATOID CHANGE. - NO MALIGNANCY IDENTIFIED. 2. Breast, left, needle core biopsy, 7 o'clock position (coil clip) - INVASIVE MAMMARY CARCINOMA, MSBR GRADE I/II. - SEE MICROSCOPIC DESCRIPTION Microscopic Comment  ADDENDUM: Immunohistochemistry for E-Cadherin is strongly positive in the tumor  consistent with ductal carcinoma. (JDP:ah 01/17/18)      01/16/2018 Receptors her2    Estrogen Receptor: 100%, POSITIVE, STRONG STAINING INTENSITY Progesterone Receptor: 50%, POSITIVE, STRONG STAINING INTENSITY Proliferation Marker Ki67: 20% HER2 Negative      01/16/2018 Cancer Staging    Staging form: Breast, AJCC 8th Edition - Clinical stage from 01/16/2018: Stage IA (cT1c, cN0, cM0, G2, ER+, PR+, HER2-) - Signed by Truitt Merle, MD on 01/23/2018      01/22/2018 Initial Diagnosis    Malignant neoplasm of lower-inner quadrant of left breast in female, estrogen receptor positive ([place])       HISTORY OF PRESENTING ILLNESS:  01/23/18  Martha [name] 57 y.o. female is a here because of newly diagnosed left breast cancer. The patient presents to the clinic today by herself.   She found a lump herself 2 months ago when she was in the shower. Upon mammogram the lump she felt was a cyst and her tumor was found next to it. She had a lumpectomy in her 34s due to infertility medication at the time. She has always had dense breast and had been doing 3D mammograms most of the time.   Today she notes she feels fine and healthy overall.  Socially she is unemployed, married and has a Psychiatrist. She drinks 30 light beers a week. She is still smoking and is willing to quit smoking again. She set a date with Dr. Isidore Moos to stop. She had been smoking for 40 years 1 pack a day. She notes she is active.   She has hypothyroidism and takes levothyroxine and she takes Paxil to help with Menopause and hot flashes. She had a uterine ablation due to menorrhagia. She started menopause 3-4 years ago.  She had 2 ectopic pregnancy. She had b/l salpingectomy. She had appendectomy in the past. She notes her sister had melanoma from sun exposure.     GYN HISTORY  Menarchal: 75 LMP: Age 75, had uterine ablation due to menorrhagia. Started menopause in her 34s Contraceptive: Yes, 1980-1986 HRT: Yes, 1 year of Combipatch  until 2018 G2P0A2: 2 ectopic pregnancy.      MEDICAL HISTORY:  Past Medical History:  Diagnosis Date  . Heart murmur   . Thyroid disease     SURGICAL HISTORY: Past Surgical History:  Procedure Laterality Date  . APPENDECTOMY    . BREAST BIOPSY Right 2014   fibroadenoma  . BREAST EXCISIONAL BIOPSY Left 1990   benign  . BREAST SURGERY    . EYE SURGERY      SOCIAL HISTORY: Social History   Socioeconomic History  . Marital status: Married    Spouse name: Not on file  . Number of children: Not on file  . Years of education: Not on file  . Highest education level: Not on file  Occupational History  . Not on file  Social Needs  . Financial resource strain: Not on file  . Food insecurity:    Worry: Not on file    Inability: Not on file  . Transportation needs:    Medical: Not on file    Non-medical: Not on file  Tobacco Use  . Smoking status: Current Every Day Smoker    Packs/day: 1.00    Years: 40.00    Pack years: 40.00  . Smokeless tobacco: Never Used  Substance and Sexual Activity  . Alcohol use: Yes    Alcohol/week: 18.0 oz    Types: 30 Cans of beer per week    Comment: 30 drinks per week   . Drug use: No  . Sexual activity: Yes  Lifestyle  . Physical activity:    Days per week: Not on file    Minutes per session: Not on file  . Stress: Not on file  Relationships  . Social connections:    Talks on phone: Not on file    Gets together: Not on file    Attends religious service: Not on file    Active member of club or organization: Not on file    Attends meetings of clubs or organizations: Not on file    Relationship status: Not on file  . Intimate partner violence:    Fear of current or ex partner: Not on file    Emotionally abused: Not on file    Physically abused: Not on file    Forced sexual activity: Not on file  Other Topics Concern  . Not on file  Social History Narrative  . Not on file    FAMILY HISTORY: Family History  Problem  Relation Age of Onset  . Heart disease Mother   . Hyperlipidemia Sister     ALLERGIES:  has No Known Allergies.  MEDICATIONS:  Current Outpatient Medications  Medication Sig Dispense Refill  . levothyroxine (SYNTHROID, LEVOTHROID) 75 MCG tablet Take 1 tablet (75 mcg total) by mouth daily before breakfast. 90 tablet 0  . PARoxetine (PAXIL) 20 MG tablet     . Probiotic Product (PROBIOTIC-10 PO) Probiotic  1 po q day     No current facility-administered medications for this visit.     REVIEW OF SYSTEMS:   Constitutional: Denies fevers, chills or abnormal night sweats Eyes: Denies blurriness of vision, double vision or watery eyes Ears, nose, mouth,  throat, and face: Denies mucositis or sore throat Respiratory: Denies cough, dyspnea or wheezes Cardiovascular: Denies palpitation, chest discomfort or lower extremity swelling Gastrointestinal:  Denies nausea, heartburn or change in bowel habits Skin: Denies abnormal skin rashes Lymphatics: Denies new lymphadenopathy or easy bruising Neurological:Denies numbness, tingling or new weaknesses Behavioral/Psych: Mood is stable, no new changes  All other systems were reviewed with the patient and are negative.  PHYSICAL EXAMINATION: ECOG PERFORMANCE STATUS: 0 - Asymptomatic  Vitals:   01/23/18 1333  BP: 125/68  Pulse: 75  Resp: 18  Temp: 98.7 F (37.1 C)  SpO2: 100%   Filed Weights   01/23/18 1333  Weight: 123 lb 12.8 oz (56.2 kg)    GENERAL:alert, no distress and comfortable SKIN: skin color, texture, turgor are normal, no rashes or significant lesions EYES: normal, conjunctiva are pink and non-injected, sclera clear OROPHARYNX:no exudate, no erythema and lips, buccal mucosa, and tongue normal  NECK: supple, thyroid normal size, non-tender, without nodularity LYMPH:  no palpable lymphadenopathy in the cervical, axillary or inguinal LUNGS: clear to auscultation and percussion with normal breathing effort HEART: regular rate &  rhythm and no murmurs and no lower extremity edema ABDOMEN:abdomen soft, non-tender and normal bowel sounds Musculoskeletal:no cyanosis of digits and no clubbing  PSYCH: alert & oriented x 3 with fluent speech NEURO: no focal motor/sensory deficits BREAST: (+) Skin ecchymosis at biopsy sites of left breast. Palpable 1cm cystic lesion at 7:30 position underneath the aurora, very close to the nipple. No other palpable lump and adenopathy in breasts or axilla.   LABORATORY DATA:  I have reviewed the data as listed CBC Latest Ref Rng & Units 01/23/2018  WBC 3.9 - 10.3 K/uL 10.7(H)  Hemoglobin 11.6 - 15.9 g/dL 14.5  Hematocrit 34.8 - 46.6 % 43.5  Platelets 145 - 400 K/uL 209    CMP Latest Ref Rng & Units 01/23/2018  Glucose 70 - 140 mg/dL 109  BUN 7 - 26 mg/dL 15  Creatinine 0.60 - 1.10 mg/dL 0.80  Sodium 136 - 145 mmol/L 140  Potassium 3.5 - 5.1 mmol/L 5.2(H)  Chloride 98 - 109 mmol/L 104  CO2 22 - 29 mmol/L 25  Calcium 8.4 - 10.4 mg/dL 9.8  Total Protein 6.4 - 8.3 g/dL 7.4  Total Bilirubin 0.2 - 1.2 mg/dL 0.7  Alkaline Phos 40 - 150 U/L 89  AST 5 - 34 U/L 19  ALT 0 - 55 U/L 17     PATHOLOGY   Diagnosis 01/16/18 1. Breast, left, needle core biopsy, 7:30 o'clock (ribbon clip) - FIBROCYSTIC CHANGES WITH SCLEROSING ADENOSIS AND CALCIFICATIONS. - FIBROADENOMATOID CHANGE. - NO MALIGNANCY IDENTIFIED. 2. Breast, left, needle core biopsy, 7 o'clock position (coil clip) - INVASIVE MAMMARY CARCINOMA, MSBR GRADE I/II. - SEE MICROSCOPIC DESCRIPTION Microscopic Comment 2. E-Cadherin and breast prognostic profile will be performed. Dr. Vic Ripper agrees. Called to the [facility] on 01/17/18. (JDP:kh 01/17/18) ADDENDUM: Immunohistochemistry for E-Cadherin is strongly positive in the tumor consistent with ductal carcinoma. (JDP:ah 01/17/18) 2. PROGNOSTIC INDICATORS Results: IMMUNOHISTOCHEMICAL AND MORPHOMETRIC ANALYSIS PERFORMED MANUALLY Estrogen Receptor: 100%, POSITIVE,  STRONG STAINING INTENSITY Progesterone Receptor: 50%, POSITIVE, STRONG STAINING INTENSITY Proliferation Marker Ki67: 20% REFERENCE RANGE ESTROGEN RECEPTOR NEGATIVE 0% POSITIVE =>1% REFERENCE RANGE PROGESTERONE RECEPTOR NEGATIVE 0% POSITIVE =>1% All controls stained appropriately Enid Cutter MD Pathologist, Electronic Signature ( Signed 01/22/2018) 2. FLUORESCENCE IN-SITU HYBRIDIZATION Results: HER2 - NEGATIVE RATIO OF HER2/CEP17 SIGNALS 1.05 AVERAGE HER2 COPY NUMBER PER CELL 1.15 1 of 3 Amended copy Addendum FINAL  for DISH, Joycelyn Schmid [phone]) ADDITIONAL INFORMATION:(continued) Reference Range: NEGATIVE HER2/CEP17 Ratio <2.0 and average HER2 copy number <4.0 EQUIVOCAL HER2/CEP17 Ratio <2.0 and average HER2 copy number >=4.0 and <6.0 POSITIVE HER2/CEP17 Ratio >=2.0 or <2.0 and average HER2 copy number >=6.0   RADIOGRAPHIC STUDIES: I have personally reviewed the radiological images as listed and agreed with the findings in the report. US Breast Ltd Uni Left Inc Axilla  Addendum Date: 01/15/2018   ADDENDUM REPORT: 01/15/2018 16:36 ADDENDUM: For addition of recommendations: Recommend breast MRI for further evaluation of the left breast after biopsy of the above recommended areas. Electronically Signed   By: Lovey Newcomer M.D.   On: 01/15/2018 16:36   Result Date: 01/15/2018 CLINICAL DATA:  Patient presents for palpable abnormality within the anterior left breast. EXAM: DIGITAL DIAGNOSTIC BILATERAL MAMMOGRAM WITH CAD AND TOMO ULTRASOUND LEFT BREAST COMPARISON:  Previous exam(s). ACR Breast Density Category c: The breast tissue is heterogeneously dense, which may obscure small masses. FINDINGS: Underlying the palpable marker within the anterior slightly medial left breast is a focal area of distortion. Within the lateral aspect of the left breast there is a 7 mm group of linearly oriented fine punctate calcifications, these are best demonstrated on the CC view. There are additional  scattered loosely grouped calcifications throughout the left and right breast. Mammographic images were processed with CAD. On physical exam, I palpate a firm mass within the left breast 7 o'clock position. Targeted ultrasound is performed, showing a 1.2 x 1.4 x 1.3 cm mixed echogenicity mass left breast 7 o'clock position retroareolar location at the site of palpable concern. Within the left breast 7:30 o'clock retroareolar location, adjacent to the palpable mass, is a 0.5 x 0.4 x 0.5 cm oval circumscribed hypoechoic mass. No left axillary adenopathy. IMPRESSION: 1. Suspicious mass left breast 7 o'clock position corresponding with palpable abnormality. 2. Indeterminate hypoechoic left breast mass 7:30 o'clock, potentially representing a complicated cyst. 3. Indeterminate calcifications within the lateral left breast. Location of these calcifications is not definitely confirmed on the true lateral view. RECOMMENDATION: 1. Ultrasound-guided core needle biopsy suspicious left breast mass 7 o'clock position. 2. Ultrasound-guided aspiration versus core needle biopsy left breast mass 7:30 o'clock. 3. Stereotactic guided core needle biopsy indeterminate left breast calcifications laterally. Recommend biopsy from a CC approach as the exact location on the true lateral view is indeterminate. I have discussed the findings and recommendations with the patient. Results were also provided in writing at the conclusion of the visit. If applicable, a reminder letter will be sent to the patient regarding the next appointment. BI-RADS CATEGORY  4: Suspicious. Electronically Signed: By: Lovey Newcomer M.D. On: 01/15/2018 12:24   Mm Diag Breast Tomo Bilateral  Addendum Date: 01/15/2018   ADDENDUM REPORT: 01/15/2018 16:36 ADDENDUM: For addition of recommendations: Recommend breast MRI for further evaluation of the left breast after biopsy of the above recommended areas. Electronically Signed   By: Lovey Newcomer M.D.   On: 01/15/2018  16:36   Result Date: 01/15/2018 CLINICAL DATA:  Patient presents for palpable abnormality within the anterior left breast. EXAM: DIGITAL DIAGNOSTIC BILATERAL MAMMOGRAM WITH CAD AND TOMO ULTRASOUND LEFT BREAST COMPARISON:  Previous exam(s). ACR Breast Density Category c: The breast tissue is heterogeneously dense, which may obscure small masses. FINDINGS: Underlying the palpable marker within the anterior slightly medial left breast is a focal area of distortion. Within the lateral aspect of the left breast there is a 7 mm group of linearly oriented fine punctate calcifications, these are  best demonstrated on the CC view. There are additional scattered loosely grouped calcifications throughout the left and right breast. Mammographic images were processed with CAD. On physical exam, I palpate a firm mass within the left breast 7 o'clock position. Targeted ultrasound is performed, showing a 1.2 x 1.4 x 1.3 cm mixed echogenicity mass left breast 7 o'clock position retroareolar location at the site of palpable concern. Within the left breast 7:30 o'clock retroareolar location, adjacent to the palpable mass, is a 0.5 x 0.4 x 0.5 cm oval circumscribed hypoechoic mass. No left axillary adenopathy. IMPRESSION: 1. Suspicious mass left breast 7 o'clock position corresponding with palpable abnormality. 2. Indeterminate hypoechoic left breast mass 7:30 o'clock, potentially representing a complicated cyst. 3. Indeterminate calcifications within the lateral left breast. Location of these calcifications is not definitely confirmed on the true lateral view. RECOMMENDATION: 1. Ultrasound-guided core needle biopsy suspicious left breast mass 7 o'clock position. 2. Ultrasound-guided aspiration versus core needle biopsy left breast mass 7:30 o'clock. 3. Stereotactic guided core needle biopsy indeterminate left breast calcifications laterally. Recommend biopsy from a CC approach as the exact location on the true lateral view is  indeterminate. I have discussed the findings and recommendations with the patient. Results were also provided in writing at the conclusion of the visit. If applicable, a reminder letter will be sent to the patient regarding the next appointment. BI-RADS CATEGORY  4: Suspicious. Electronically Signed: By: Lovey Newcomer M.D. On: 01/15/2018 12:24   Mm Clip Placement Left  Result Date: 01/22/2018 CLINICAL DATA:  Status post stereotactic guided core biopsy of calcifications in the UPPER OUTER QUADRANT of the LEFT breast. EXAM: DIAGNOSTIC LEFT MAMMOGRAM POST STEREOTACTIC BIOPSY COMPARISON:  Previous exam(s). FINDINGS: Mammographic images were obtained following stereotactic guided biopsy of calcifications in the UPPER-OUTER QUADRANT of the LEFT breast. An X shaped clip is identified adjacent to residual calcifications in the UPPER-OUTER QUADRANT of the LEFT breast. IMPRESSION: Tissue marker clip in the expected location following biopsy. Final Assessment: Post Procedure Mammograms for Marker Placement Electronically Signed   By: Nolon Nations M.D.   On: 01/22/2018 12:06   Mm Clip Placement Left  Result Date: 01/16/2018 CLINICAL DATA:  Evaluate placement of 2 separate biopsy clips following 2 ultrasound-guided LEFT breast biopsies. EXAM: 3D DIAGNOSTIC LEFT MAMMOGRAM POST ULTRASOUND BIOPSY COMPARISON:  Previous exam(s). FINDINGS: 3D Mammographic images were obtained following ultrasound guided biopsy of the 5 mm mass at the 7:30 position of the RETROAREOLAR LEFT breast and ultrasound-guided biopsy of the 1.4 cm mass at the 7 o'clock position of the RETROAREOLAR LEFT breast. The RIBBON shaped clip is in satisfactory position corresponding to the 5 mm mass biopsied at the 7:30 position. The COIL shaped clip is in satisfactory position corresponding to the 1.4 cm mass biopsied at the 7 o'clock position. The 2 clips are separated by a distance of 3.5 cm. IMPRESSION: Satisfactory placement of both biopsy clips following  ultrasound-guided LEFT breast biopsies. The clips are separated by a distance of 3.5 cm. Final Assessment: Post Procedure Mammograms for Marker Placement Electronically Signed   By: Margarette Canada M.D.   On: 01/16/2018 14:13   Mm Lt Breast Bx W Loc Dev 1st Lesion Image Bx Spec Stereo Guide  Addendum Date: 01/23/2018   ADDENDUM REPORT: 01/23/2018 14:27 ADDENDUM: Pathology revealed FIBROADENOMA WITH CALCIFICATIONS, FIBROCYSTIC CHANGES WITH ADENOSIS AND CALCIFICATIONS of the Left breast, upper outer (x clip). This was found to be concordant by Dr. Nolon Nations. Pathology results were discussed with the patient by telephone.  The patient reported doing well after the biopsy with tenderness at the site. Post biopsy instructions and care were reviewed and questions were answered. The patient was encouraged to call [facility] for any additional concerns. The patient has a recent diagnosis of left breast cancer and should follow her outlined treatment plan. The patient was referred to [facility] [place] on Jan 23, 2018. Pathology results reported by Terie Purser, RN on 01/23/2018. Electronically Signed   By: Nolon Nations M.D.   On: 01/23/2018 14:27   Result Date: 01/23/2018 CLINICAL DATA:  Patient presents for stereotactic guided core biopsy of LEFT breast calcifications. Recent diagnosis of grade 1-2 invasive mammary carcinoma in the 7 o'clock location of the LEFT breast. EXAM: LEFT BREAST STEREOTACTIC CORE NEEDLE BIOPSY COMPARISON:  Previous exams. FINDINGS: The patient and I discussed the procedure of stereotactic-guided biopsy including benefits and alternatives. We discussed the high likelihood of a successful procedure. We discussed the risks of the procedure including infection, bleeding, tissue injury, clip migration, and inadequate sampling. Informed written consent was given. The usual time out protocol was  performed immediately prior to the procedure. Using sterile technique and 1% Lidocaine as local anesthetic, under stereotactic guidance, a 9 gauge vacuum assisted device was used to perform core needle biopsy of calcifications in the UPPER-OUTER QUADRANT of the LEFT breast using a superior to inferior approach. Specimen radiograph was performed showing calcifications in multiple samples. Specimens with calcifications are identified for pathology. Lesion quadrant: UPPER-OUTER QUADRANT LEFT breast At the conclusion of the procedure, X shaped tissue marker clip was deployed into the biopsy cavity. Follow-up 2-view mammogram was performed and dictated separately. IMPRESSION: Stereotactic-guided biopsy of LEFT breast calcifications. No apparent complications. Electronically Signed: By: Nolon Nations M.D. On: 01/22/2018 12:04   [place] Lt Breast Bx W Loc Dev 1st Lesion Img Bx Spec US Guide  Addendum Date: 01/17/2018   ADDENDUM REPORT: 01/17/2018 14:30 ADDENDUM: Pathology revealed FIBROCYSTIC CHANGES WITH SCLEROSING ADENOSIS AND CALCIFICATIONS, FIBROADENOMATOID CHANGE of LEFT breast, 7:30 o'clock (ribbon clip). This was found to be concordant by Dr. Hassan Rowan. Pathology revealed GRADE I-II INVASIVE MAMMARY CARCINOMAof LEFT breast, 7 o'clock position (coil clip). This was found to be concordant by Dr. Hassan Rowan with excision recommended. Pathology results were discussed with the patient by telephone. The patient reported doing well after the biopsy with tenderness at the site. Post biopsy instructions and care were reviewed and questions were answered. The patient was encouraged to call [facility] for any additional concerns. The patient was referred to [facility] at [facility] on Jan 23, 2018. Pathology results reported by Roselind Messier, RN on 01/17/2018. Electronically Signed   By: Margarette Canada M.D.   On: 01/17/2018 14:30   Result  Date: 01/17/2018 CLINICAL DATA:  57 year old female for ultrasound-guided biopsies of 2 separate indeterminate masses within the LOWER INNER RETROAREOLAR LEFT breast. It was also recommended that tissue sampling of the 7 mm group of calcifications in the OUTER LEFT breast be performed. However on further review, these calcifications are unchanged from 10/15/2012 and considered benign. These calcifications were more apparent on 01/15/2018 compared to prior studies due to [employer]. EXAM: ULTRASOUND GUIDED LEFT BREAST CORE NEEDLE BIOPSY X 2 COMPARISON:  Previous exams. FINDINGS: I met with the patient and we discussed the procedures of ultrasound-guided biopsies, including benefits and alternatives. We discussed the  high likelihood of a successful procedure. We discussed the risks of the procedure, including infection, bleeding, tissue injury, clip migration, and inadequate sampling. Informed written consent was given. The usual time-out protocol was performed immediately prior to the procedures. Ultrasound-guided LEFT breast biopsy 7:30 position lesion (RIBBON clip): Lesion quadrant: LOWER INNER LEFT breast Using sterile technique and 1% Lidocaine as local anesthetic, under direct ultrasound visualization, a 12 gauge spring-loaded device was used to perform biopsy of 5 mm hypoechoic mass at the 7:30 position of the RETROAREOLAR LEFT breast using a MEDIAL approach. At the conclusion of the procedure a RIBBON shaped tissue marker clip was deployed into the biopsy cavity. Follow up 2 view mammogram was performed and dictated separately. Ultrasound-guided LEFT breast biopsy 7:00 position lesion (COIL clip): Lesion quadrant: LOWER INNER LEFT breast Using sterile technique and 1% Lidocaine as local anesthetic, under direct ultrasound visualization, a 12 gauge spring-loaded device was used to perform biopsy of the 1.4 cm irregular mass at the 7 o'clock position of the RETROAREOLAR LEFT breast using a MEDIAL  approach. At the conclusion of the procedure a COIL shaped tissue marker clip was deployed into the biopsy cavity. Follow up 2 view mammogram was performed and dictated separately. IMPRESSION: Ultrasound guided biopsy of 5 mm mass at the 7:30 position of the RETROAREOLAR LEFT breast and 1.4 cm mass at the 7 o'clock position of the RETROAREOLAR LEFT breast. No apparent complications. Stereotactic biopsy of OUTER LEFT breast calcifications was not performed as these calcifications have been stable since 2014 and considered benign. These calcifications likely appeared more prominent on 01/15/2018 due to [employer]. Electronically Signed: By: Margarette Canada M.D. On: 01/16/2018 14:09   [place] Lt Breast Bx W Loc Dev Ea Add Lesion Img Bx Spec US Guide  Addendum Date: 01/17/2018   ADDENDUM REPORT: 01/17/2018 14:30 ADDENDUM: Pathology revealed FIBROCYSTIC CHANGES WITH SCLEROSING ADENOSIS AND CALCIFICATIONS, FIBROADENOMATOID CHANGE of LEFT breast, 7:30 o'clock (ribbon clip). This was found to be concordant by Dr. Hassan Rowan. Pathology revealed GRADE I-II INVASIVE MAMMARY CARCINOMAof LEFT breast, 7 o'clock position (coil clip). This was found to be concordant by Dr. Hassan Rowan with excision recommended. Pathology results were discussed with the patient by telephone. The patient reported doing well after the biopsy with tenderness at the site. Post biopsy instructions and care were reviewed and questions were answered. The patient was encouraged to call [facility] for any additional concerns. The patient was referred to [facility] at [facility] on Jan 23, 2018. Pathology results reported by Roselind Messier, RN on 01/17/2018. Electronically Signed   By: Margarette Canada M.D.   On: 01/17/2018 14:30   Result Date: 01/17/2018 CLINICAL DATA:  57 year old female for ultrasound-guided biopsies of 2 separate indeterminate masses within the LOWER INNER  RETROAREOLAR LEFT breast. It was also recommended that tissue sampling of the 7 mm group of calcifications in the OUTER LEFT breast be performed. However on further review, these calcifications are unchanged from 10/15/2012 and considered benign. These calcifications were more apparent on 01/15/2018 compared to prior studies due to [employer]. EXAM: ULTRASOUND GUIDED LEFT BREAST CORE NEEDLE BIOPSY X 2 COMPARISON:  Previous exams. FINDINGS: I met with the patient and we discussed the procedures of ultrasound-guided biopsies, including benefits and alternatives. We discussed the high likelihood of a successful procedure. We discussed the risks of the procedure, including infection, bleeding, tissue injury, clip migration, and inadequate sampling. Informed written consent was  given. The usual time-out protocol was performed immediately prior to the procedures. Ultrasound-guided LEFT breast biopsy 7:30 position lesion (RIBBON clip): Lesion quadrant: LOWER INNER LEFT breast Using sterile technique and 1% Lidocaine as local anesthetic, under direct ultrasound visualization, a 12 gauge spring-loaded device was used to perform biopsy of 5 mm hypoechoic mass at the 7:30 position of the RETROAREOLAR LEFT breast using a MEDIAL approach. At the conclusion of the procedure a RIBBON shaped tissue marker clip was deployed into the biopsy cavity. Follow up 2 view mammogram was performed and dictated separately. Ultrasound-guided LEFT breast biopsy 7:00 position lesion (COIL clip): Lesion quadrant: LOWER INNER LEFT breast Using sterile technique and 1% Lidocaine as local anesthetic, under direct ultrasound visualization, a 12 gauge spring-loaded device was used to perform biopsy of the 1.4 cm irregular mass at the 7 o'clock position of the RETROAREOLAR LEFT breast using a MEDIAL approach. At the conclusion of the procedure a COIL shaped tissue marker clip was deployed into the biopsy cavity. Follow up 2 view mammogram was  performed and dictated separately. IMPRESSION: Ultrasound guided biopsy of 5 mm mass at the 7:30 position of the RETROAREOLAR LEFT breast and 1.4 cm mass at the 7 o'clock position of the RETROAREOLAR LEFT breast. No apparent complications. Stereotactic biopsy of OUTER LEFT breast calcifications was not performed as these calcifications have been stable since 2014 and considered benign. These calcifications likely appeared more prominent on 01/15/2018 due to [employer]. Electronically Signed: By: Margarette Canada M.D. On: 01/16/2018 14:09    ASSESSMENT & PLAN:  Melissia Lahman is a 57 y.o. Caucasian female with a history of Thyroid disease and heart murmur.   1. Malignant neoplasm of lower-inner quadrant of left breast, invasive ductal carcinoma, stage IA, [id number], ER/PR Positive, HER2 Negative, Grade I --We discussed her imaging findings and the biopsy results in great details. She has a 7:30 postioned mass that is benign cyst adjacent to a invasive ductal carcinoma in the 7:00 position. -Giving the small size of her tumor, she likely need a lumpectomy with sentinel lymph node biopsy. She is agreeable with that. She was seen by Dr. Barry Dienes today and likely will proceed with surgery lumpectomy and sentinel lymph node biopsy soon.  -I recommend a Oncotype Dx test on the surgical sample and we'll make a decision about adjuvant chemotherapy based on the Oncotype result. Written material of this test was given to her. She is young and fit, would be a good candidate for chemotherapy if her Oncotype recurrence score is high. She is interested in this.  -If her surgical sentinel lymph node positive, I recommend mammaprint for further risk stratification and guide adjuvant chemotherapy. -The risk of recurrence depends on the stage and biology of the tumor. She has early stage, with ER/PR positive and HER2 negative markers, low grade, is likely low risk disease. I discussed this is the more common type of slow  growing tumor.  -She was also seen by radiation oncologist Dr. Isidore Moos today. If her surgical sentinel lymph nodes were negative, she would not need post mastectomy radiation.  -Giving the strong ER and PR expression in her postmenopausal status, I recommend adjuvant endocrine therapy with aromatase inhibitor for a total of 5-10 years to reduce the risk of cancer recurrence. Potential benefits and side effects were discussed with patient and she is interested. -We also discussed the breast cancer surveillance after her surgery. She will continue annual screening mammogram, self exam, and a routine office visit with lab and exam  with [place]. -I encouraged her to have healthy diet and exercise regularly.  -Labs reveiwed, her potassium is up to 5.2. I suggest she reduce her intake of nuts and potassium in diet.  -I will see her after her surgery or radiaiton.    2. Smoking Cessation, Alcohol Use  -She has been smoking for 40 years 1 pack a day. She is eligible for lung cancer screenings. She is interested.  -She has set a date to quit smoking. I encouraged her to continue to work at this.  -I recommend she cut down on her alcohol intake. She is willing to do this.   3.  Hypothyroidism -New medication and follow-up with PCP   PLAN:  -She will proceed with lumpectomy and sentinel lymph node biopsy with Dr. Barry Dienes soon  -Oncotype on her surgical sample if tumor more than 1cm (or >0.5cm with G2-3) -I will see her after her surgery and possible radiaiton.    All questions were answered. The patient knows to call the clinic with any problems, questions or concerns. I spent 40 minutes counseling the patient face to face. The total time spent in the appointment was 50 minutes and more than 50% was on counseling.     Truitt Merle, MD 01/23/2018   I, Joslyn Devon, am acting as scribe for Truitt Merle, MD.    I have reviewed the above documentation for accuracy and completeness, and I agree with the above.

## 2018-01-23 ENCOUNTER — Inpatient Hospital Stay: Payer: 59 | Attending: Hematology | Admitting: Hematology

## 2018-01-23 ENCOUNTER — Other Ambulatory Visit: Payer: Self-pay

## 2018-01-23 ENCOUNTER — Ambulatory Visit: Payer: 59 | Attending: General Surgery | Admitting: Physical Therapy

## 2018-01-23 ENCOUNTER — Ambulatory Visit
Admission: RE | Admit: 2018-01-23 | Discharge: 2018-01-23 | Disposition: A | Discharge status: Home / Self Care | Payer: 59 | Source: Ambulatory Visit | Attending: Radiation Oncology | Admitting: Radiation Oncology

## 2018-01-23 ENCOUNTER — Encounter: Payer: Self-pay | Admitting: Hematology

## 2018-01-23 ENCOUNTER — Encounter: Payer: Self-pay | Admitting: Radiation Oncology

## 2018-01-23 ENCOUNTER — Other Ambulatory Visit: Payer: Self-pay | Admitting: General Surgery

## 2018-01-23 ENCOUNTER — Inpatient Hospital Stay: Payer: 59

## 2018-01-23 ENCOUNTER — Encounter: Payer: Self-pay | Admitting: Physical Therapy

## 2018-01-23 ENCOUNTER — Encounter: Payer: Self-pay | Admitting: General Practice

## 2018-01-23 VITALS — BP 125/68 | HR 75 | Temp 98.7°F | Resp 18 | Ht 63.0 in | Wt 123.8 lb

## 2018-01-23 DIAGNOSIS — E039 Hypothyroidism, unspecified: Secondary | ICD-10-CM | POA: Diagnosis not present

## 2018-01-23 DIAGNOSIS — R293 Abnormal posture: Secondary | ICD-10-CM | POA: Diagnosis not present

## 2018-01-23 DIAGNOSIS — C50312 Malignant neoplasm of lower-inner quadrant of left female breast: Secondary | ICD-10-CM

## 2018-01-23 DIAGNOSIS — Z170 Estrogen receptor positive status [ER+]: Principal | ICD-10-CM

## 2018-01-23 DIAGNOSIS — F17210 Nicotine dependence, cigarettes, uncomplicated: Secondary | ICD-10-CM | POA: Insufficient documentation

## 2018-01-23 DIAGNOSIS — Z79899 Other long term (current) drug therapy: Secondary | ICD-10-CM

## 2018-01-23 LAB — CMP (CANCER CENTER ONLY)
ALBUMIN: 4.4 g/dL (ref 3.5–5.0)
ALK PHOS: 89 U/L (ref 40–150)
ALT: 17 U/L (ref 0–55)
AST: 19 U/L (ref 5–34)
Anion gap: 11 (ref 3–11)
BUN: 15 mg/dL (ref 7–26)
CALCIUM: 9.8 mg/dL (ref 8.4–10.4)
CO2: 25 mmol/L (ref 22–29)
Chloride: 104 mmol/L (ref 98–109)
Creatinine: 0.8 mg/dL (ref 0.60–1.10)
GFR, Estimated: >60 mL/min (ref >60)
GLUCOSE: 109 mg/dL (ref 70–140)
POTASSIUM: 5.2 mmol/L — AB (ref 3.5–5.1)
SODIUM: 140 mmol/L (ref 136–145)
Total Bilirubin: 0.7 mg/dL (ref 0.2–1.2)
Total Protein: 7.4 g/dL (ref 6.4–8.3)

## 2018-01-23 LAB — CBC WITH DIFFERENTIAL (CANCER CENTER ONLY)
BASOS ABS: 0 10*3/uL (ref 0.0–0.1)
BASOS PCT: 0 %
Eosinophils Absolute: 0 10*3/uL (ref 0.0–0.5)
Eosinophils Relative: 0 %
HEMATOCRIT: 43.5 % (ref 34.8–46.6)
HEMOGLOBIN: 14.5 g/dL (ref 11.6–15.9)
LYMPHS PCT: 16 %
Lymphs Abs: 1.7 10*3/uL (ref 0.9–3.3)
MCH: 32.3 pg (ref 25.1–34.0)
MCHC: 33.3 g/dL (ref 31.5–36.0)
MCV: 96.9 fL (ref 79.5–101.0)
MONOS PCT: 6 %
Monocytes Absolute: 0.6 10*3/uL (ref 0.1–0.9)
NEUTROS ABS: 8.4 10*3/uL — AB (ref 1.5–6.5)
NEUTROS PCT: 78 %
Platelet Count: 209 10*3/uL (ref 145–400)
RBC: 4.49 MIL/uL (ref 3.70–5.45)
RDW: 13.5 % (ref 11.2–14.5)
WBC Count: 10.7 10*3/uL — ABNORMAL HIGH (ref 3.9–10.3)

## 2018-01-23 NOTE — Patient Instructions (Signed)

## 2018-01-23 NOTE — Therapy (Signed)
[place], [place], [address] Phone: [phone]   Fax:  [phone]  Physical Therapy Evaluation  Patient Details  Name: Martha [name] MRN: [id number] Date of Birth: July 04, 1961 Referring Provider: Dr. [name]   Encounter Date: 01/23/2018  PT End of Session - 01/23/18 1444    Visit Number  1    Number of Visits  2    Date for PT Re-Evaluation  03/20/18    PT Start Time  1350    PT Stop Time  1411    PT Time Calculation (min)  21 min    Activity Tolerance  Patient tolerated treatment well    Behavior During Therapy  [facility] for tasks assessed/performed       Past Medical History:  Diagnosis Date  . Heart murmur   . Thyroid disease     Past Surgical History:  Procedure Laterality Date  . APPENDECTOMY    . BREAST BIOPSY Right 2014   fibroadenoma  . BREAST EXCISIONAL BIOPSY Left 1990   benign  . BREAST SURGERY    . EYE SURGERY      There were no vitals filed for this visit.   Subjective Assessment - 01/23/18 1436    Subjective  Patient reports she is here today to be seen by her medical team for her newly diagnosed left breast cancer.    Pertinent History  Patient was diagnosed on 01/15/18 with left grade II invasive ductal carcinoma breast cancer. It measures 1.4 cm and is located in the lower inner quadrant. It is ER/PR positive and HER2 negative with a Ki67 of 20%. She smokes a pack per day and drinks 5 beers/day.    Patient Stated Goals  Reduce lymphedema risk and learn post op shoulder ROM HEP    Currently in Pain?  No/denies         [facility] PT Assessment - 01/23/18 0001      Assessment   Medical Diagnosis  Left breast cancer    Referring Provider  Dr. [name]    Onset Date/Surgical Date  01/15/18    Hand Dominance  Right    Prior Therapy  none      Precautions   Precautions  Other (comment)    Precaution Comments  active cancer      Restrictions   Weight Bearing  Restrictions  No      Balance Screen   Has the patient fallen in the past 6 months  No    Has the patient had a decrease in activity level because of a fear of falling?   No    Is the patient reluctant to leave their home because of a fear of falling?   No      Home Environment   Living Environment  Private residence    Living Arrangements  Spouse/significant other;Parent Husband and 19 y.o. mother    Available Help at Discharge  Family      Prior Function   Level of [place]  Unemployed    Leisure  She does not exercise      Cognition   Overall Cognitive Status  Within Functional Limits for tasks assessed      Posture/Postural Control   Posture/Postural Control  Postural limitations    Postural Limitations  Forward head;Rounded Shoulders      ROM / Strength   AROM / PROM / Strength  AROM;Strength      AROM  AROM Assessment Site  Shoulder;Cervical    Right/Left Shoulder  Right;Left    Right Shoulder Extension  48 Degrees    Right Shoulder Flexion  160 Degrees    Right Shoulder ABduction  170 Degrees    Right Shoulder Internal Rotation  70 Degrees    Right Shoulder External Rotation  67 Degrees    Left Shoulder Extension  57 Degrees    Left Shoulder Flexion  155 Degrees    Left Shoulder ABduction  165 Degrees    Left Shoulder Internal Rotation  85 Degrees    Left Shoulder External Rotation  79 Degrees    Cervical Flexion  WNL    Cervical Extension  WNL    Cervical - Right Side Bend  WNL    Cervical - Left Side Bend  WNL    Cervical - Right Rotation  WNL    Cervical - Left Rotation  WNL      Strength   Overall Strength  Within functional limits for tasks performed        LYMPHEDEMA/ONCOLOGY QUESTIONNAIRE - 01/23/18 1442      Type   Cancer Type  Left breast cancer      Lymphedema Assessments   Lymphedema Assessments  Upper extremities      Right Upper Extremity Lymphedema   10 cm Proximal to Olecranon Process  23.5 cm     Olecranon Process  21.6 cm    10 cm Proximal to Ulnar Styloid Process  19.2 cm    Just Proximal to Ulnar Styloid Process  14 cm    Across Hand at [employer]  17.8 cm    At [place] of 2nd Digit  6.2 cm      Left Upper Extremity Lymphedema   10 cm Proximal to Olecranon Process  23.7 cm    Olecranon Process  22 cm    10 cm Proximal to Ulnar Styloid Process  18.8 cm    Just Proximal to Ulnar Styloid Process  14.1 cm    Across Hand at [employer]  17.4 cm    At [place] of 2nd Digit  6 cm             Objective measurements completed on examination: See above findings.    Patient was instructed today in a home exercise program today for post op shoulder range of motion. These included active assist shoulder flexion in sitting, scapular retraction, wall walking with shoulder abduction, and hands behind head external rotation.  She was encouraged to do these twice a day, holding 3 seconds and repeating 5 times when permitted by her physician.     PT Education - 01/23/18 1443    Education provided  Yes    Education Details  Lymphedema risk reduction and post op shoulder ROM HEP    Person(s) Educated  Patient    Methods  Explanation;Demonstration;Handout    Comprehension  Returned demonstration;Verbalized understanding          PT Long Term Goals - 01/23/18 1527      PT LONG TERM GOAL #1   Title  Patient will demonstrate she has returned to baseline post operatively related to shoulder ROM and function.    Time  [facility] Goals - 01/23/18 1527      Patient will be able to verbalize understanding of pertinent lymphedema risk reduction practices relevant to her diagnosis  specifically related to skin care.   Time  1    Period  Days    Status  Achieved      Patient will be able to return demonstrate and/or verbalize understanding of the post-op home exercise program related to regaining shoulder range of motion.   Time  1     Period  Days    Status  Achieved      Patient will be able to verbalize understanding of the importance of attending the postoperative After Breast Cancer Class for further lymphedema risk reduction education and therapeutic exercise.   Time  1    Period  Days    Status  Achieved            Plan - 01/23/18 1444    Clinical Impression Statement  Patient was diagnosed on 01/15/18 with left grade II invasive ductal carcinoma breast cancer. It measures 1.4 cm and is located in the lower inner quadrant. It is ER/PR positive and HER2 negative with a Ki67 of 20%. She smokes a pack per day and drinks 5 beers/day. Her multidisciplinary medical team met prior to her assessments to determine a recommended treatment plan. She is planning to have a left lumpectomy and sentinel node biopsy followed by Oncotype testing, radiation, and anti-estrogen therapy. She will benefit from a post op PT visit to determine PT needs.    Clinical Presentation  Stable    Clinical Decision Making  Low    Rehab Potential  Excellent    Clinical Impairments Affecting Rehab Potential  None    PT Frequency  -- Eval and 1 f/u visit    PT Treatment/Interventions  ADLs/Self Care Home Management;Therapeutic exercise;Patient/family education    PT Next Visit Plan  Will reassess 3-4 weeks post op to determine PT needs    PT Home Exercise Plan  Post op shoulder ROM HEP    Consulted and Agree with Plan of Care  Patient       Patient will benefit from skilled therapeutic intervention in order to improve the following deficits and impairments:  Decreased knowledge of precautions, Impaired UE functional use, Decreased range of motion, Postural dysfunction, Pain  Visit Diagnosis: Malignant neoplasm of lower-inner quadrant of left breast in female, estrogen receptor positive ([place]) - Plan: PT plan of care cert/re-cert  Abnormal posture - Plan: PT plan of care cert/re-cert   Patient will follow up at outpatient cancer rehab 3-4  weeks following surgery.  If the patient requires physical therapy at that time, a specific plan will be dictated and sent to the referring physician for approval. The patient was educated today on appropriate basic range of motion exercises to begin post operatively and the importance of attending the After Breast Cancer class following surgery.  Patient was educated today on lymphedema risk reduction practices as it pertains to recommendations that will benefit the patient immediately following surgery.  She verbalized good understanding.     Problem List Patient Active Problem List   Diagnosis Date Noted  . Malignant neoplasm of lower-inner quadrant of left breast in female, estrogen receptor positive ([place]) 01/22/2018  . Hypothyroidism 04/04/2017  . Nicotine dependence with current use 04/04/2017  . Excessive drinking of alcohol 04/04/2017  . Menopausal symptom 04/04/2017   Annia Friendly, PT 01/23/18 3:32 PM  [place], [place], [address] Phone: [phone]   Fax:  [phone]  Name: Martha [name] MRN: [id number] Date of Birth: Jan 11, 1961

## 2018-01-23 NOTE — Progress Notes (Signed)
Radiation Oncology         [phone] ________________________________  Initial outpatient Consultation  Name: Martha [name] MRN: [id number]  Date: 01/23/2018  DOB: [birth year]  WU:[id number], [name], MD  [name], MD   REFERRING PHYSICIAN: [name], MD  DIAGNOSIS:    ICD-10-CM   1. Malignant neoplasm of lower-inner quadrant of left breast in female, estrogen receptor positive ([place]) C50.312    Z17.0    Cancer Staging Malignant neoplasm of lower-inner quadrant of left breast in female, estrogen receptor positive ([place]) Staging form: Breast, AJCC 8th Edition - Clinical stage from 01/16/2018: Stage IA (cT1c, cN0, cM0, G2, ER+, PR+, HER2-) - Signed by Truitt Merle, MD on 01/23/2018   CHIEF COMPLAINT: Here to discuss management of left breast cancer  HISTORY OF PRESENT ILLNESS::Martha [name] is a 57 y.o. female who presented with a palpable mass to her left breast.    She underwent bilateral diagnostic mammography with tomography and left breast ultrasonography at [facility] on 01/15/2018 showing: Suspicious mass left breast 7 o'clock position corresponding with palpable abnormality. Indeterminate hypoechoic left breast mass 7:30 o'clock,potentially representing a complicated cyst. Indeterminate calcifications within the lateral left breast. Location of these calcifications is not definitely confirmed on the true lateral view.  Accordingly on 01/16/2018 she proceeded to left needle core biopsy with pathology from this procedure showing: Breast, left, needle core biopsy, 7:30 o'clock (ribbon clip) with fibrocystic changes with fibrocystic changes with sclerosing adenosis and calcifications. Fibroadenomatoid change. No malignancy identified. Breast, left, needle core biopsy, 7 o'clock position (coil clip) with invasive mammary carcinoma, MSBR, grade I/II. Prognostic indicators significant for: ER, 100% positive and PR, 50% positive, both with strong staining intensity.  Proliferation marker Ki67 at 20%. HER2 negative.   She also had a left needle core biopsy on 01/22/2018 with results showing: Breast, left, needle core biopsy, upper outer calcifications x clip with fibroadenoma with calcifications. Fibrocystic changes with adenosis and calcifications. There is no evidence of malignancy.   On review of systems, she reports generalized myalgias and lump to left breast. she denies any other symptoms. She has a PMHx of hypothyroidism. Pertinent positives are listed and detailed within the above HPI.   Menarche: 57 years old Age at first live birth: 0 GP: GxP0 LMP: 2018 Contraceptive: Yes; 1980-1986 HRT: Yes; 1 year combipatch - stopped   PREVIOUS RADIATION THERAPY: No  PAST MEDICAL HISTORY:  has a past medical history of Heart murmur and Thyroid disease.    PAST SURGICAL HISTORY: Past Surgical History:  Procedure Laterality Date  . APPENDECTOMY    . BREAST BIOPSY Right 2014   fibroadenoma  . BREAST EXCISIONAL BIOPSY Left 1990   benign  . BREAST SURGERY    . EYE SURGERY      FAMILY HISTORY: family history includes Heart disease in her mother; Hyperlipidemia in her sister.  SOCIAL HISTORY:  reports that she has been smoking.  She has a 40.00 pack-year smoking history. She has never used smokeless tobacco. She reports that she drinks about 18.0 oz of alcohol per week. She reports that she does not use drugs.  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    ALLERGIES: Patient has no known allergies.  MEDICATIONS:  Current Outpatient Medications  Medication Sig Dispense Refill  . levothyroxine (SYNTHROID, LEVOTHROID) 75 MCG tablet Take 1 tablet (75 mcg total) by mouth daily before breakfast. 90 tablet 0  . PARoxetine (  PAXIL) 20 MG tablet     . Probiotic Product (PROBIOTIC-10 PO) Probiotic  1 po q day     No current facility-administered medications for this encounter.     REVIEW OF  SYSTEMS: A 10+ POINT REVIEW OF SYSTEMS WAS OBTAINED including neurology, dermatology, psychiatry, cardiac, respiratory, lymph, extremities, GI, GU, Musculoskeletal, constitutional, breasts, reproductive, HEENT.  All pertinent positives are noted in the HPI.  All others are negative.   PHYSICAL EXAM:  Vitals with BMI 01/23/2018  Height 5' 3"   Weight 123 lbs 13 oz  BMI 81.27  Systolic 517  Diastolic 68  Pulse 75  Respirations 18   General: Alert and oriented, in no acute distress HEENT: Head is normocephalic. Extraocular movements are intact. Oropharynx is clear. Neck: Neck is supple, no palpable cervical or supraclavicular lymphadenopathy. Heart: Regular in rate and rhythm with no murmurs, rubs, or gallops. Chest: Clear to auscultation bilaterally, with no rhonchi, wheezes, or rales. Abdomen: Soft, nontender, nondistended, with no rigidity or guarding. Extremities: No cyanosis or edema. Lymphatics: see Neck Exam Skin: No concerning lesions. Musculoskeletal: symmetric strength and muscle tone throughout. Neurologic: Cranial nerves II through XII are grossly intact. No obvious focalities. Speech is fluent. Coordination is intact. Psychiatric: Judgment and insight are intact. Affect is appropriate. Breasts: Left breast with periareolar 1 cm round mass at 8:00 o'clock and in the UOQ of the left breast she has post bx swelling that is about 3 cm in dimension. No palpable axillary adenopathy bilaterally. Right breast is negative.    ECOG = 0  0 - Asymptomatic (Fully active, able to carry on all predisease activities without restriction)  1 - Symptomatic but completely ambulatory (Restricted in physically strenuous activity but ambulatory and able to carry out work of a light or sedentary nature. For example, light housework, office work)  2 - Symptomatic, <50% in bed during the day (Ambulatory and capable of all self care but unable to carry out any work activities. Up and about more than  50% of waking hours)  3 - Symptomatic, >50% in bed, but not bedbound (Capable of only limited self-care, confined to bed or chair 50% or more of waking hours)  4 - Bedbound (Completely disabled. Cannot carry on any self-care. Totally confined to bed or chair)  5 - Death   Eustace Pen MM, Creech RH, Tormey DC, et al. [phone]). "Toxicity and response criteria of the [facility]". [place] Oncol. 5 (6): 649-55   LABORATORY DATA:  Lab Results  Component Value Date   WBC 10.7 (H) 01/23/2018   HGB 14.5 01/23/2018   HCT 43.5 01/23/2018   MCV 96.9 01/23/2018   PLT 209 01/23/2018   CMP     Component Value Date/Time   NA 140 01/23/2018 1215   K 5.2 (H) 01/23/2018 1215   CL 104 01/23/2018 1215   CO2 25 01/23/2018 1215   GLUCOSE 109 01/23/2018 1215   BUN 15 01/23/2018 1215   CREATININE 0.80 01/23/2018 1215   CALCIUM 9.8 01/23/2018 1215   PROT 7.4 01/23/2018 1215   ALBUMIN 4.4 01/23/2018 1215   AST 19 01/23/2018 1215   ALT 17 01/23/2018 1215   ALKPHOS 89 01/23/2018 1215   BILITOT 0.7 01/23/2018 1215   GFRNONAA >60 01/23/2018 1215   GFRAA >60 01/23/2018 1215         RADIOGRAPHY: US Breast Ltd Uni Left Inc Axilla  Addendum Date: 01/15/2018   ADDENDUM REPORT: 01/15/2018 16:36 ADDENDUM: For addition of recommendations: Recommend breast  MRI for further evaluation of the left breast after biopsy of the above recommended areas. Electronically Signed   By: Lovey Newcomer M.D.   On: 01/15/2018 16:36   Result Date: 01/15/2018 CLINICAL DATA:  Patient presents for palpable abnormality within the anterior left breast. EXAM: DIGITAL DIAGNOSTIC BILATERAL MAMMOGRAM WITH CAD AND TOMO ULTRASOUND LEFT BREAST COMPARISON:  Previous exam(s). ACR Breast Density Category c: The breast tissue is heterogeneously dense, which may obscure small masses. FINDINGS: Underlying the palpable marker within the anterior slightly medial left breast is a focal area of distortion. Within the lateral aspect  of the left breast there is a 7 mm group of linearly oriented fine punctate calcifications, these are best demonstrated on the CC view. There are additional scattered loosely grouped calcifications throughout the left and right breast. Mammographic images were processed with CAD. On physical exam, I palpate a firm mass within the left breast 7 o'clock position. Targeted ultrasound is performed, showing a 1.2 x 1.4 x 1.3 cm mixed echogenicity mass left breast 7 o'clock position retroareolar location at the site of palpable concern. Within the left breast 7:30 o'clock retroareolar location, adjacent to the palpable mass, is a 0.5 x 0.4 x 0.5 cm oval circumscribed hypoechoic mass. No left axillary adenopathy. IMPRESSION: 1. Suspicious mass left breast 7 o'clock position corresponding with palpable abnormality. 2. Indeterminate hypoechoic left breast mass 7:30 o'clock, potentially representing a complicated cyst. 3. Indeterminate calcifications within the lateral left breast. Location of these calcifications is not definitely confirmed on the true lateral view. RECOMMENDATION: 1. Ultrasound-guided core needle biopsy suspicious left breast mass 7 o'clock position. 2. Ultrasound-guided aspiration versus core needle biopsy left breast mass 7:30 o'clock. 3. Stereotactic guided core needle biopsy indeterminate left breast calcifications laterally. Recommend biopsy from a CC approach as the exact location on the true lateral view is indeterminate. I have discussed the findings and recommendations with the patient. Results were also provided in writing at the conclusion of the visit. If applicable, a reminder letter will be sent to the patient regarding the next appointment. BI-RADS CATEGORY  4: Suspicious. Electronically Signed: By: Lovey Newcomer M.D. On: 01/15/2018 12:24   Mm Diag Breast Tomo Bilateral  Addendum Date: 01/15/2018   ADDENDUM REPORT: 01/15/2018 16:36 ADDENDUM: For addition of recommendations: Recommend breast  MRI for further evaluation of the left breast after biopsy of the above recommended areas. Electronically Signed   By: Lovey Newcomer M.D.   On: 01/15/2018 16:36   Result Date: 01/15/2018 CLINICAL DATA:  Patient presents for palpable abnormality within the anterior left breast. EXAM: DIGITAL DIAGNOSTIC BILATERAL MAMMOGRAM WITH CAD AND TOMO ULTRASOUND LEFT BREAST COMPARISON:  Previous exam(s). ACR Breast Density Category c: The breast tissue is heterogeneously dense, which may obscure small masses. FINDINGS: Underlying the palpable marker within the anterior slightly medial left breast is a focal area of distortion. Within the lateral aspect of the left breast there is a 7 mm group of linearly oriented fine punctate calcifications, these are best demonstrated on the CC view. There are additional scattered loosely grouped calcifications throughout the left and right breast. Mammographic images were processed with CAD. On physical exam, I palpate a firm mass within the left breast 7 o'clock position. Targeted ultrasound is performed, showing a 1.2 x 1.4 x 1.3 cm mixed echogenicity mass left breast 7 o'clock position retroareolar location at the site of palpable concern. Within the left breast 7:30 o'clock retroareolar location, adjacent to the palpable mass, is a 0.5 x 0.4 x  0.5 cm oval circumscribed hypoechoic mass. No left axillary adenopathy. IMPRESSION: 1. Suspicious mass left breast 7 o'clock position corresponding with palpable abnormality. 2. Indeterminate hypoechoic left breast mass 7:30 o'clock, potentially representing a complicated cyst. 3. Indeterminate calcifications within the lateral left breast. Location of these calcifications is not definitely confirmed on the true lateral view. RECOMMENDATION: 1. Ultrasound-guided core needle biopsy suspicious left breast mass 7 o'clock position. 2. Ultrasound-guided aspiration versus core needle biopsy left breast mass 7:30 o'clock. 3. Stereotactic guided core needle  biopsy indeterminate left breast calcifications laterally. Recommend biopsy from a CC approach as the exact location on the true lateral view is indeterminate. I have discussed the findings and recommendations with the patient. Results were also provided in writing at the conclusion of the visit. If applicable, a reminder letter will be sent to the patient regarding the next appointment. BI-RADS CATEGORY  4: Suspicious. Electronically Signed: By: Lovey Newcomer M.D. On: 01/15/2018 12:24   Mm Clip Placement Left  Result Date: 01/22/2018 CLINICAL DATA:  Status post stereotactic guided core biopsy of calcifications in the UPPER OUTER QUADRANT of the LEFT breast. EXAM: DIAGNOSTIC LEFT MAMMOGRAM POST STEREOTACTIC BIOPSY COMPARISON:  Previous exam(s). FINDINGS: Mammographic images were obtained following stereotactic guided biopsy of calcifications in the UPPER-OUTER QUADRANT of the LEFT breast. An X shaped clip is identified adjacent to residual calcifications in the UPPER-OUTER QUADRANT of the LEFT breast. IMPRESSION: Tissue marker clip in the expected location following biopsy. Final Assessment: Post Procedure Mammograms for Marker Placement Electronically Signed   By: Nolon Nations M.D.   On: 01/22/2018 12:06   Mm Clip Placement Left  Result Date: 01/16/2018 CLINICAL DATA:  Evaluate placement of 2 separate biopsy clips following 2 ultrasound-guided LEFT breast biopsies. EXAM: 3D DIAGNOSTIC LEFT MAMMOGRAM POST ULTRASOUND BIOPSY COMPARISON:  Previous exam(s). FINDINGS: 3D Mammographic images were obtained following ultrasound guided biopsy of the 5 mm mass at the 7:30 position of the RETROAREOLAR LEFT breast and ultrasound-guided biopsy of the 1.4 cm mass at the 7 o'clock position of the RETROAREOLAR LEFT breast. The RIBBON shaped clip is in satisfactory position corresponding to the 5 mm mass biopsied at the 7:30 position. The COIL shaped clip is in satisfactory position corresponding to the 1.4 cm mass  biopsied at the 7 o'clock position. The 2 clips are separated by a distance of 3.5 cm. IMPRESSION: Satisfactory placement of both biopsy clips following ultrasound-guided LEFT breast biopsies. The clips are separated by a distance of 3.5 cm. Final Assessment: Post Procedure Mammograms for Marker Placement Electronically Signed   By: Margarette Canada M.D.   On: 01/16/2018 14:13   Mm Lt Breast Bx W Loc Dev 1st Lesion Image Bx Spec Stereo Guide  Addendum Date: 01/23/2018   ADDENDUM REPORT: 01/23/2018 14:27 ADDENDUM: Pathology revealed FIBROADENOMA WITH CALCIFICATIONS, FIBROCYSTIC CHANGES WITH ADENOSIS AND CALCIFICATIONS of the Left breast, upper outer (x clip). This was found to be concordant by Dr. Nolon Nations. Pathology results were discussed with the patient by telephone. The patient reported doing well after the biopsy with tenderness at the site. Post biopsy instructions and care were reviewed and questions were answered. The patient was encouraged to call [facility] for any additional concerns. The patient has a recent diagnosis of left breast cancer and should follow her outlined treatment plan. The patient was referred to [facility] on Jan 23, 2018. Pathology results reported by Terie Purser, RN on 01/23/2018.  Electronically Signed   By: Nolon Nations M.D.   On: 01/23/2018 14:27   Result Date: 01/23/2018 CLINICAL DATA:  Patient presents for stereotactic guided core biopsy of LEFT breast calcifications. Recent diagnosis of grade 1-2 invasive mammary carcinoma in the 7 o'clock location of the LEFT breast. EXAM: LEFT BREAST STEREOTACTIC CORE NEEDLE BIOPSY COMPARISON:  Previous exams. FINDINGS: The patient and I discussed the procedure of stereotactic-guided biopsy including benefits and alternatives. We discussed the high likelihood of a successful procedure. We discussed the risks of the procedure  including infection, bleeding, tissue injury, clip migration, and inadequate sampling. Informed written consent was given. The usual time out protocol was performed immediately prior to the procedure. Using sterile technique and 1% Lidocaine as local anesthetic, under stereotactic guidance, a 9 gauge vacuum assisted device was used to perform core needle biopsy of calcifications in the UPPER-OUTER QUADRANT of the LEFT breast using a superior to inferior approach. Specimen radiograph was performed showing calcifications in multiple samples. Specimens with calcifications are identified for pathology. Lesion quadrant: UPPER-OUTER QUADRANT LEFT breast At the conclusion of the procedure, X shaped tissue marker clip was deployed into the biopsy cavity. Follow-up 2-view mammogram was performed and dictated separately. IMPRESSION: Stereotactic-guided biopsy of LEFT breast calcifications. No apparent complications. Electronically Signed: By: Nolon Nations M.D. On: 01/22/2018 12:04   [place] Lt Breast Bx W Loc Dev 1st Lesion Img Bx Spec US Guide  Addendum Date: 01/17/2018   ADDENDUM REPORT: 01/17/2018 14:30 ADDENDUM: Pathology revealed FIBROCYSTIC CHANGES WITH SCLEROSING ADENOSIS AND CALCIFICATIONS, FIBROADENOMATOID CHANGE of LEFT breast, 7:30 o'clock (ribbon clip). This was found to be concordant by Dr. Hassan Rowan. Pathology revealed GRADE I-II INVASIVE MAMMARY CARCINOMAof LEFT breast, 7 o'clock position (coil clip). This was found to be concordant by Dr. Hassan Rowan with excision recommended. Pathology results were discussed with the patient by telephone. The patient reported doing well after the biopsy with tenderness at the site. Post biopsy instructions and care were reviewed and questions were answered. The patient was encouraged to call [facility] for any additional concerns. The patient was referred to [facility] on  Jan 23, 2018. Pathology results reported by Roselind Messier, RN on 01/17/2018. Electronically Signed   By: Margarette Canada M.D.   On: 01/17/2018 14:30   Result Date: 01/17/2018 CLINICAL DATA:  57 year old female for ultrasound-guided biopsies of 2 separate indeterminate masses within the LOWER INNER RETROAREOLAR LEFT breast. It was also recommended that tissue sampling of the 7 mm group of calcifications in the OUTER LEFT breast be performed. However on further review, these calcifications are unchanged from 10/15/2012 and considered benign. These calcifications were more apparent on 01/15/2018 compared to prior studies due to [employer]. EXAM: ULTRASOUND GUIDED LEFT BREAST CORE NEEDLE BIOPSY X 2 COMPARISON:  Previous exams. FINDINGS: I met with the patient and we discussed the procedures of ultrasound-guided biopsies, including benefits and alternatives. We discussed the high likelihood of a successful procedure. We discussed the risks of the procedure, including infection, bleeding, tissue injury, clip migration, and inadequate sampling. Informed written consent was given. The usual time-out protocol was performed immediately prior to the procedures. Ultrasound-guided LEFT breast biopsy 7:30 position lesion (RIBBON clip): Lesion quadrant: LOWER INNER LEFT breast Using sterile technique and 1% Lidocaine as local anesthetic, under direct ultrasound visualization, a 12 gauge spring-loaded device was used to perform biopsy of 5 mm hypoechoic mass at the 7:30 position of  the RETROAREOLAR LEFT breast using a MEDIAL approach. At the conclusion of the procedure a RIBBON shaped tissue marker clip was deployed into the biopsy cavity. Follow up 2 view mammogram was performed and dictated separately. Ultrasound-guided LEFT breast biopsy 7:00 position lesion (COIL clip): Lesion quadrant: LOWER INNER LEFT breast Using sterile technique and 1% Lidocaine as local anesthetic, under direct ultrasound visualization, a 12 gauge  spring-loaded device was used to perform biopsy of the 1.4 cm irregular mass at the 7 o'clock position of the RETROAREOLAR LEFT breast using a MEDIAL approach. At the conclusion of the procedure a COIL shaped tissue marker clip was deployed into the biopsy cavity. Follow up 2 view mammogram was performed and dictated separately. IMPRESSION: Ultrasound guided biopsy of 5 mm mass at the 7:30 position of the RETROAREOLAR LEFT breast and 1.4 cm mass at the 7 o'clock position of the RETROAREOLAR LEFT breast. No apparent complications. Stereotactic biopsy of OUTER LEFT breast calcifications was not performed as these calcifications have been stable since 2014 and considered benign. These calcifications likely appeared more prominent on 01/15/2018 due to [employer]. Electronically Signed: By: Margarette Canada M.D. On: 01/16/2018 14:09   [place] Lt Breast Bx W Loc Dev Ea Add Lesion Img Bx Spec US Guide  Addendum Date: 01/17/2018   ADDENDUM REPORT: 01/17/2018 14:30 ADDENDUM: Pathology revealed FIBROCYSTIC CHANGES WITH SCLEROSING ADENOSIS AND CALCIFICATIONS, FIBROADENOMATOID CHANGE of LEFT breast, 7:30 o'clock (ribbon clip). This was found to be concordant by Dr. Hassan Rowan. Pathology revealed GRADE I-II INVASIVE MAMMARY CARCINOMAof LEFT breast, 7 o'clock position (coil clip). This was found to be concordant by Dr. Hassan Rowan with excision recommended. Pathology results were discussed with the patient by telephone. The patient reported doing well after the biopsy with tenderness at the site. Post biopsy instructions and care were reviewed and questions were answered. The patient was encouraged to call [facility] for any additional concerns. The patient was referred to [facility] on Jan 23, 2018. Pathology results reported by Roselind Messier, RN on 01/17/2018. Electronically Signed   By: Margarette Canada M.D.   On: 01/17/2018 14:30    Result Date: 01/17/2018 CLINICAL DATA:  57 year old female for ultrasound-guided biopsies of 2 separate indeterminate masses within the LOWER INNER RETROAREOLAR LEFT breast. It was also recommended that tissue sampling of the 7 mm group of calcifications in the OUTER LEFT breast be performed. However on further review, these calcifications are unchanged from 10/15/2012 and considered benign. These calcifications were more apparent on 01/15/2018 compared to prior studies due to [employer]. EXAM: ULTRASOUND GUIDED LEFT BREAST CORE NEEDLE BIOPSY X 2 COMPARISON:  Previous exams. FINDINGS: I met with the patient and we discussed the procedures of ultrasound-guided biopsies, including benefits and alternatives. We discussed the high likelihood of a successful procedure. We discussed the risks of the procedure, including infection, bleeding, tissue injury, clip migration, and inadequate sampling. Informed written consent was given. The usual time-out protocol was performed immediately prior to the procedures. Ultrasound-guided LEFT breast biopsy 7:30 position lesion (RIBBON clip): Lesion quadrant: LOWER INNER LEFT breast Using sterile technique and 1% Lidocaine as local anesthetic, under direct ultrasound visualization, a 12 gauge spring-loaded device was used to perform biopsy of 5 mm hypoechoic mass at the 7:30 position of the RETROAREOLAR LEFT breast using a MEDIAL approach. At the conclusion of the procedure a RIBBON shaped tissue marker clip was deployed into the biopsy cavity. Follow  up 2 view mammogram was performed and dictated separately. Ultrasound-guided LEFT breast biopsy 7:00 position lesion (COIL clip): Lesion quadrant: LOWER INNER LEFT breast Using sterile technique and 1% Lidocaine as local anesthetic, under direct ultrasound visualization, a 12 gauge spring-loaded device was used to perform biopsy of the 1.4 cm irregular mass at the 7 o'clock position of the RETROAREOLAR LEFT breast using a  MEDIAL approach. At the conclusion of the procedure a COIL shaped tissue marker clip was deployed into the biopsy cavity. Follow up 2 view mammogram was performed and dictated separately. IMPRESSION: Ultrasound guided biopsy of 5 mm mass at the 7:30 position of the RETROAREOLAR LEFT breast and 1.4 cm mass at the 7 o'clock position of the RETROAREOLAR LEFT breast. No apparent complications. Stereotactic biopsy of OUTER LEFT breast calcifications was not performed as these calcifications have been stable since 2014 and considered benign. These calcifications likely appeared more prominent on 01/15/2018 due to [employer]. Electronically Signed: By: Margarette Canada M.D. On: 01/16/2018 14:09      IMPRESSION/PLAN: Stage I Breast cancer Tumor board recommendations: SNL+lumpectomy, oncotype, Radiation, hormonal therapy  It was a pleasure meeting the patient today. We discussed the risks, benefits, and side effects of radiotherapy. I recommend radiotherapy to the  left breast to reduce her risk of locoregional recurrence by 2/3.  We discussed that radiation would take approximately 4 weeks to complete and that I would give the patient a few weeks to heal following surgery before starting treatment planning. If chemotherapy were to be given, this would precede radiotherapy. We spoke about acute effects including skin irritation and fatigue as well as much less common late effects including internal organ injury or irritation. We spoke about the latest technology that is used to minimize the risk of late effects for patients undergoing radiotherapy to the breast or chest wall. No guarantees of treatment were given. The patient is enthusiastic about proceeding with treatment. I look forward to participating in the patient's care.  I will await her referral back to me for postoperative follow-up and eventual CT simulation/treatment planning.  I asked the patient today about tobacco use. The patient uses tobacco.  I  advised the patient to quit. Services were offered by me today including outpatient counseling and pharmacotherapy. I assessed for the willingness to attempt to quit and provided encouragement and demonstrated willingness to make referrals and/or prescriptions to help the patient attempt to quit. The patient has follow-up with the oncologic team to touch base on their tobacco use and /or cessation efforts.  Over 3 minutes were spent on this issue. She has set a quit date of June 16. She will call [place].  She declines Rxs at this time.    __________________________________________   Eppie Gibson, MD   This document serves as a record of services personally performed by Eppie Gibson, MD. It was created on her behalf by [facility], a trained medical scribe. The creation of this record is based on the scribe's personal observations and the provider's statements to them. This document has been checked and approved by the attending provider.

## 2018-01-24 ENCOUNTER — Telehealth: Payer: Self-pay | Admitting: Hematology

## 2018-01-24 NOTE — Telephone Encounter (Signed)
No 5/29 LOS °

## 2018-01-24 NOTE — Progress Notes (Signed)
[place] Psychosocial Distress Screening Spiritual Care  Met with Martha [name], who goes by "Martha [name]," in [facility] to introduce [place] team/resources, reviewing distress screen per protocol.  The patient scored a 0 on the Psychosocial Distress Thermometer which indicates minimal distress. Also assessed for distress and other psychosocial needs.   ONCBCN DISTRESS SCREENING 01/24/2018  Screening Type Initial Screening  Distress experienced in past week (1-10) 0  Practical problem type Insurance  Information Concerns Type Lack of info about treatment  Referral to support programs Yes   Martha [name] was very welcoming of [name] and reports minimal distress; per pt, she is naturally an upbeat, positive person. Even in the midst of many recent life transitions, she remains present and in good spirits.    Follow up needed: No. Pt plans to explore support programs and knows to contact Team anytime, but please also page if needs arise or circumstances change. Thank you.   [place], [place], [facility] Pager [phone] Voicemail [phone]

## 2018-01-31 ENCOUNTER — Telehealth: Payer: Self-pay | Admitting: *Deleted

## 2018-01-31 NOTE — Telephone Encounter (Signed)
Lef vm concerning from [facility] on 5.29. Request return call for questions or needs. Contact information provided.

## 2018-02-05 ENCOUNTER — Other Ambulatory Visit: Payer: Self-pay | Admitting: Family Medicine

## 2018-02-05 NOTE — Telephone Encounter (Signed)
Last prescribed by historical provider.  Please advise.

## 2018-02-13 NOTE — Pre-Procedure Instructions (Signed)
Martha [name]  02/13/2018      [id number] [name], [name] [place] [address] Phone: [phone] Fax: [phone]    Your procedure is scheduled on June 27  Report to [facility] Admitting at 0800 A.M.  Call this number if you have problems the morning of surgery:  [phone]   Remember:  NOTHING TO EAT OR DRINK AFTER MIDNIGHT EXCEPT PLEASE DRINK THE PRE-SURGERY ENSURE BY 0700 AM THE MORNING OF SURGERY    Take these medicines the morning of surgery with A SIP OF WATER  levothyroxine (SYNTHROID, LEVOTHROID)  7 days prior to surgery STOP taking any Aspirin(unless otherwise instructed by your surgeon), Aleve, Naproxen, Ibuprofen, Motrin, Advil, Goody's, BC's, all herbal medications, fish oil, and all vitamins     Do not wear jewelry, make-up or nail polish.  Do not wear lotions, powders, or perfumes, or deodorant.  Do not shave 48 hours prior to surgery.    Do not bring valuables to the hospital.  Martha [name] is not responsible for any belongings or valuables.  Contacts, dentures or bridgework may not be worn into surgery.  Leave your suitcase in the car.  After surgery it may be brought to your room.  For patients admitted to the hospital, discharge time will be determined by your treatment team.  Patients discharged the day of surgery will not be allowed to drive home.    Special instructions:   [place]- Preparing For Surgery  Before surgery, you can play an important role. Because skin is not sterile, your skin needs to be as free of germs as possible. You can reduce the number of germs on your skin by washing with CHG (chlorahexidine gluconate) Soap before surgery.  CHG is an antiseptic cleaner which kills germs and bonds with the skin to continue killing germs even after washing.    Oral Hygiene is also important to reduce your risk of infection.  Remember - BRUSH YOUR TEETH THE MORNING OF SURGERY  WITH YOUR REGULAR TOOTHPASTE  Please do not use if you have an allergy to CHG or antibacterial soaps. If your skin becomes reddened/irritated stop using the CHG.  Do not shave (including legs and underarms) for at least 48 hours prior to first CHG shower. It is OK to shave your face.  Please follow these instructions carefully.   1. Shower the NIGHT BEFORE SURGERY and the MORNING OF SURGERY with CHG.   2. If you chose to wash your hair, wash your hair first as usual with your normal shampoo.  3. After you shampoo, rinse your hair and body thoroughly to remove the shampoo.  4. Use CHG as you would any other liquid soap. You can apply CHG directly to the skin and wash gently with a scrungie or a clean washcloth.   5. Apply the CHG Soap to your body ONLY FROM THE NECK DOWN.  Do not use on open wounds or open sores. Avoid contact with your eyes, ears, mouth and genitals (private parts). Wash Face and genitals (private parts)  with your normal soap.  6. Wash thoroughly, paying special attention to the area where your surgery will be performed.  7. Thoroughly rinse your body with warm water from the neck down.  8. DO NOT shower/wash with your normal soap after using and rinsing off the CHG Soap.  9. Pat yourself dry with a CLEAN TOWEL.  10. Wear CLEAN PAJAMAS to bed the  night before surgery, wear comfortable clothes the morning of surgery  11. Place CLEAN SHEETS on your bed the night of your first shower and DO NOT SLEEP WITH PETS.    Day of Surgery:  Do not apply any deodorants/lotions.  Please wear clean clothes to the hospital/surgery center.   Remember to brush your teeth WITH YOUR REGULAR TOOTHPASTE.    Please read over the following fact sheets that you were given.

## 2018-02-14 ENCOUNTER — Encounter (HOSPITAL_COMMUNITY): Payer: Self-pay

## 2018-02-14 ENCOUNTER — Other Ambulatory Visit: Payer: Self-pay

## 2018-02-14 ENCOUNTER — Encounter (HOSPITAL_COMMUNITY)
Admission: RE | Admit: 2018-02-14 | Discharge: 2018-02-14 | Disposition: A | Discharge status: Home / Self Care | Payer: 59 | Source: Ambulatory Visit | Attending: General Surgery | Admitting: General Surgery

## 2018-02-14 DIAGNOSIS — Z01812 Encounter for preprocedural laboratory examination: Secondary | ICD-10-CM | POA: Diagnosis not present

## 2018-02-14 DIAGNOSIS — Z01810 Encounter for preprocedural cardiovascular examination: Secondary | ICD-10-CM | POA: Insufficient documentation

## 2018-02-14 DIAGNOSIS — C50912 Malignant neoplasm of unspecified site of left female breast: Secondary | ICD-10-CM | POA: Diagnosis not present

## 2018-02-14 HISTORY — DX: Other specified postprocedural states: Z98.890

## 2018-02-14 HISTORY — DX: Nausea with vomiting, unspecified: R11.2

## 2018-02-14 HISTORY — DX: Cardiac arrhythmia, unspecified: I49.9

## 2018-02-14 LAB — BASIC METABOLIC PANEL
Anion gap: 8 (ref 5–15)
BUN: <5 mg/dL — ABNORMAL LOW (ref 6–20)
CALCIUM: 9.4 mg/dL (ref 8.9–10.3)
CHLORIDE: 107 mmol/L (ref 101–111)
CO2: 27 mmol/L (ref 22–32)
CREATININE: 0.6 mg/dL (ref 0.44–1.00)
GFR calc non Af Amer: >60 mL/min (ref >60)
Glucose, Bld: 93 mg/dL (ref 65–99)
Potassium: 4.2 mmol/L (ref 3.5–5.1)
SODIUM: 142 mmol/L (ref 135–145)

## 2018-02-14 LAB — CBC
HCT: 45 % (ref 36.0–46.0)
HEMOGLOBIN: 14.6 g/dL (ref 12.0–15.0)
MCH: 31.1 pg (ref 26.0–34.0)
MCHC: 32.4 g/dL (ref 30.0–36.0)
MCV: 95.9 fL (ref 78.0–100.0)
Platelets: 190 10*3/uL (ref 150–400)
RBC: 4.69 MIL/uL (ref 3.87–5.11)
RDW: 13.6 % (ref 11.5–15.5)
WBC: 5.4 10*3/uL (ref 4.0–10.5)

## 2018-02-14 MED ORDER — CHLORHEXIDINE GLUCONATE CLOTH 2 % EX PADS: 6.0000 | MEDICATED_PAD | Freq: Once | CUTANEOUS | Status: DC

## 2018-02-15 ENCOUNTER — Encounter (HOSPITAL_COMMUNITY): Payer: Self-pay

## 2018-02-15 NOTE — Progress Notes (Signed)
Anesthesia Chart Review:   Case:  [id number] Date/Time:  02/21/18 0945   Procedure:  BREAST LUMPECTOMY WITH AXILLARY LYMPH NODE BIOPSY (Left )   Anesthesia type:  General   Pre-op diagnosis:  LEFT BREAST CANCER   Location:  [facility] OR ROOM 02 / [place] OR   Surgeon:  [name], MD      DISCUSSION: - Pt is a 57 year old female with hx SVT (s/p ablation ~2012 in [place] at [facility], no longer sees cardiology; no recurrence or issues since),   VS: BP (!) 117/57   Pulse 75   Temp 36.5 C   Resp 20   Ht 5' 2.5" (1.588 m)   Wt 124 lb 11.2 oz (56.6 kg)   SpO2 99%   BMI 22.44 kg/m    PROVIDERS: PCP is [name], MD   LABS: Labs reviewed: Acceptable for surgery. (all labs ordered are listed, but only abnormal results are displayed)  Labs Reviewed  BASIC METABOLIC PANEL - Abnormal; Notable for the following components:      Result Value   BUN <5 (*)    All other components within normal limits  CBC    EKG 02/14/18: NSR   Past Medical History:  Diagnosis Date  . Dysrhythmia    SVT, s/p ablation ~ 2012 in at [facility]  . PONV (postoperative nausea and vomiting)   . Thyroid disease     Past Surgical History:  Procedure Laterality Date  . APPENDECTOMY    . BREAST BIOPSY Right 2014   fibroadenoma  . BREAST EXCISIONAL BIOPSY Left 1990   benign  . BREAST SURGERY    . EYE SURGERY      MEDICATIONS: . levothyroxine (SYNTHROID, LEVOTHROID) 75 MCG tablet  . OVER THE COUNTER MEDICATION  . PARoxetine (PAXIL) 20 MG tablet  . Probiotic Product (PROBIOTIC PO)   No current facility-administered medications for this encounter.     If no changes, I anticipate pt can proceed with surgery as scheduled.   Willeen Cass, FNP-BC [facility] Short Stay Surgical Center/Anesthesiology Phone: [phone] 02/15/2018 1:41 PM

## 2018-02-18 NOTE — H&P (View-Only) (Signed)
[name] Appointment: 01/23/2018 1:00 PM Location: [facility] Patient #: [id number] DOB: [birth year] Undefined / Language: [name] / Race: White Female   History of Present Illness Martha Klein MD; 01/23/2018 3:44 PM) The patient is a 57 year old female who presents with breast cancer. Pt is a 57 yo F who is referred for consultation at the request of Dr. Melanee Spry for a new diagnosis of left breast cancer. She presented with a palpable left breast mass. She then had diagnostic imaging that showed several findings. She had a 1.4 cm mass at 7 o'clock and one at 7:30. There were also calcifications seen that were at 3 o'clock that were benign. She has no prior cancer history. She has no family history of cancer. She denies breast pain or nipple discharge. She feels well overall. She took HRT for 1 year in the form of a combipatch, but is no longer on it.   She is a G2P0 wtih 2 ectopic pregnancies. menarche was age 79. She is a current smoker. She drinks around 5 drinks per week.    Dx mammogram/us 01/15/2018 ACR Breast Density Category c: The breast tissue is heterogeneously dense, which may obscure small masses.  FINDINGS: Underlying the palpable marker within the anterior slightly medial left breast is a focal area of distortion. Within the lateral aspect of the left breast there is a 7 mm group of linearly oriented fine punctate calcifications, these are best demonstrated on the CC view. There are additional scattered loosely grouped calcifications throughout the left and right breast.  Mammographic images were processed with CAD.  On physical exam, I palpate a firm mass within the left breast 7 o'clock position.  Targeted ultrasound is performed, showing a 1.2 x 1.4 x 1.3 cm mixed echogenicity mass left breast 7 o'clock position retroareolar location at the site of palpable concern.  Within the left breast 7:30 o'clock retroareolar location, adjacent to the  palpable mass, is a 0.5 x 0.4 x 0.5 cm oval circumscribed hypoechoic mass.  No left axillary adenopathy.  IMPRESSION: 1. Suspicious mass left breast 7 o'clock position corresponding with palpable abnormality. 2. Indeterminate hypoechoic left breast mass 7:30 o'clock, potentially representing a complicated cyst. 3. Indeterminate calcifications within the lateral left breast. Location of these calcifications is not definitely confirmed on the true lateral view.  RECOMMENDATION: 1. Ultrasound-guided core needle biopsy suspicious left breast mass 7 o'clock position. 2. Ultrasound-guided aspiration versus core needle biopsy left breast mass 7:30 o'clock. 3. Stereotactic guided core needle biopsy indeterminate left breast calcifications laterally. Recommend biopsy from a CC approach as the exact location on the true lateral view is indeterminate.  I have discussed the findings and recommendations with the patient. Results were also provided in writing at the conclusion of the visit. If applicable, a reminder letter will be sent to the patient regarding the next appointment.  BI-RADS CATEGORY 4: Suspicious.  pathology 5/22 Diagnosis 1. Breast, left, needle core biopsy, 7:30 o'clock (ribbon clip) - FIBROCYSTIC CHANGES WITH SCLEROSING ADENOSIS AND CALCIFICATIONS. - FIBROADENOMATOID CHANGE. - NO MALIGNANCY IDENTIFIED. 2. Breast, left, needle core biopsy, 7 o'clock position (coil clip) - INVASIVE MAMMARY CARCINOMA, MSBR GRADE I/II. - SEE MICROSCOPIC DESCRIPTION Microscopic Comment Immunohistochemistry for E-Cadherin is strongly positive in the tumor consistent with ductal carcinoma. Estrogen Receptor: 100%, POSITIVE, STRONG STAINING INTENSITY Progesterone Receptor: 50%, POSITIVE, STRONG STAINING INTENSITY Proliferation Marker Ki67: 20% HER2 - NEGATIVE  Labs:  CMET, CBC essentially normal.    Past Surgical History Martha Klein, MD; 01/22/2018 5:44  PM) Appendectomy  Breast  Biopsy  Left. Oral Surgery   Diagnostic Studies History Martha Klein, MD; 01/22/2018 5:44 PM) Colonoscopy  5-10 years ago Mammogram  within last year Pap Smear  1-5 years ago  Social History Martha Klein, MD; 01/22/2018 5:44 PM) Alcohol use  Moderate alcohol use. Caffeine use  Carbonated beverages, Coffee, Tea. No drug use  Tobacco use  Current every day smoker.  Family History Martha Klein, MD; 01/22/2018 5:44 PM) Colon Polyps  Father. Melanoma  Sister.  Other Problems Martha Klein, MD; 01/22/2018 5:44 PM) General anesthesia - complications  Thyroid Disease     Review of Systems Martha Klein MD; 01/22/2018 5:44 PM) HEENT Present- Wears glasses/contact lenses. Not Present- Earache, Hearing Loss, Hoarseness, Nose Bleed, Oral Ulcers, Ringing in the Ears, Seasonal Allergies, Sinus Pain, Sore Throat, Visual Disturbances and Yellow Eyes. Respiratory Not Present- Bloody sputum, Chronic Cough, Difficulty Breathing, Snoring and Wheezing. Breast Present- Breast Mass. Not Present- Breast Pain, Nipple Discharge and Skin Changes. Cardiovascular Not Present- Chest Pain, Difficulty Breathing Lying Down, Leg Cramps, Palpitations, Rapid Heart Rate, Shortness of Breath and Swelling of Extremities. Gastrointestinal Not Present- Abdominal Pain, Bloating, Bloody Stool, Change in Bowel Habits, Chronic diarrhea, Constipation, Difficulty Swallowing, Excessive gas, Gets full quickly at meals, Hemorrhoids, Indigestion, Nausea, Rectal Pain and Vomiting. Female Genitourinary Not Present- Frequency, Nocturia, Painful Urination, Pelvic Pain and Urgency. Musculoskeletal Not Present- Back Pain, Joint Pain, Joint Stiffness, Muscle Pain, Muscle Weakness and Swelling of Extremities. Neurological Not Present- Decreased Memory, Fainting, Headaches, Numbness, Seizures, Tingling, Tremor, Trouble walking and Weakness. Psychiatric Not Present- Anxiety, Bipolar, Change in Sleep Pattern, Depression, Fearful and  Frequent crying. Endocrine Present- Hot flashes. Not Present- Cold Intolerance, Excessive Hunger, Hair Changes, Heat Intolerance and New Diabetes. Hematology Present- Gland problems. Not Present- Blood Thinners, Easy Bruising, Excessive bleeding, HIV and Persistent Infections.  Vitals Martha Klein MD; 01/23/2018 3:40 PM) 01/23/2018 3:40 PM Weight: 123.6 lb Height: 63in Body Surface Area: 1.58 m Body Mass Index: 21.89 kg/m  Temp.: 98.72F  Pulse: 75 (Regular)  Resp.: 18 (Unlabored)  P.OX: 100% (Room air) BP: 125/68 (Sitting, Left Arm, Standard)       Physical Exam Martha Klein MD; 01/23/2018 3:42 PM) General Mental Status-Alert. General Appearance-Consistent with stated age. Hydration-Well hydrated. Voice-Normal.  Head and Neck Head-normocephalic, atraumatic with no lesions or palpable masses. Trachea-midline. Thyroid Gland Characteristics - normal size and consistency.  Eye Eyeball - Bilateral-Extraocular movements intact. Sclera/Conjunctiva - Bilateral-No scleral icterus.  Chest and Lung Exam Chest and lung exam reveals -quiet, even and easy respiratory effort with no use of accessory muscles and on auscultation, normal breath sounds, no adventitious sounds and normal vocal resonance. Inspection Chest Wall - Normal. Back - normal.  Breast Note: breasts are symmetric bilaterally. left breast wtih palpable 1-1.5 cm mass at 7 o'clock. Mobile. no nipple retraction or skin dimpling. No LAD. right breast normal. Both breasts wtih heterogeneously dense breast tissue.   Cardiovascular Cardiovascular examination reveals -normal heart sounds, regular rate and rhythm with no murmurs and normal pedal pulses bilaterally.  Abdomen Inspection Inspection of the abdomen reveals - No Hernias. Palpation/Percussion Palpation and Percussion of the abdomen reveal - Soft, Non Tender, No Rebound tenderness, No Rigidity (guarding) and No  hepatosplenomegaly. Auscultation Auscultation of the abdomen reveals - Bowel sounds normal.  Neurologic Neurologic evaluation reveals -alert and oriented x 3 with no impairment of recent or remote memory. Mental Status-Normal.  Musculoskeletal Global Assessment -Note: no gross deformities.  Normal Exam - Left-Upper Extremity Strength Normal  and Lower Extremity Strength Normal. Normal Exam - Right-Upper Extremity Strength Normal and Lower Extremity Strength Normal.  Lymphatic Head & Neck  General Head & Neck Lymphatics: Bilateral - Description - Normal. Axillary  General Axillary Region: Bilateral - Description - Normal. Tenderness - Non Tender. Femoral & Inguinal  Generalized Femoral & Inguinal Lymphatics: Bilateral - Description - No Generalized lymphadenopathy.    Assessment & Plan Martha Klein MD; 01/23/2018 3:43 PM) PRIMARY CANCER OF LOWER-INNER QUADRANT OF LEFT FEMALE BREAST (C50.312) Impression: Pt has a new diagnosis of a cT1cN0 left breast cancer. She is recommended to get a lumpectomy/SLN bx followed by radiation, oncotype, and antihormone tx.  I do not think she needs a seed due to the superficial and palpable nature of the mass.  The surgical procedure was described to the patient. I discussed the incision type and location and that we would need radiology involved on with a wire or seed marker and/or sentinel node.  The risks and benefits of the procedure were described to the patient and she wishes to proceed.  We discussed the risks bleeding, infection, damage to other structures, need for further procedures/surgeries. We discussed the risk of seroma. The patient was advised if the area in the breast in cancer, we may need to go back to surgery for additional tissue to obtain negative margins or for a lymph node biopsy. The patient was advised that these are the most common complications, but that others can occur as well. They were advised against taking  aspirin or other anti-inflammatory agents/blood thinners the week before surgery. Current Plans You are being scheduled for surgery- Our schedulers will call you.  You should hear from our office's scheduling department within 5 working days about the location, date, and time of surgery. We try to make accommodations for patient's preferences in scheduling surgery, but sometimes the OR schedule or the surgeon's schedule prevents [place] from making those accommodations.  If you have not heard from our office [phone]) in 5 working days, call the office and ask for your surgeon's nurse.  If you have other questions about your diagnosis, plan, or surgery, call the office and ask for your surgeon's nurse.  Pt Education - [facility] Breast Cancer Information Given - [facility] "Breast Journey" Package Pt Education - flb breast cancer surgery: discussed with patient and provided information.   Signed by Martha Klein, MD (01/23/2018 3:45 PM)

## 2018-02-18 NOTE — H&P (Signed)
Martha [name] Appointment: 01/23/2018 1:00 PM Location: [facility] Patient #: [id number] DOB: 1960-12-12 Undefined / Language: [name] / Race: White Female   History of Present Illness [name] MD; 01/23/2018 3:44 PM) The patient is a 57 year old female who presents with breast cancer. Pt is a 58 yo F who is referred for consultation at the request of Dr. Melanee Spry for a new diagnosis of left breast cancer. She presented with a palpable left breast mass. She then had diagnostic imaging that showed several findings. She had a 1.4 cm mass at 7 o'clock and one at 7:30. There were also calcifications seen that were at 3 o'clock that were benign. She has no prior cancer history. She has no family history of cancer. She denies breast pain or nipple discharge. She feels well overall. She took HRT for 1 year in the form of a combipatch, but is no longer on it.   She is a G2P0 wtih 2 ectopic pregnancies. menarche was age 41. She is a current smoker. She drinks around 5 drinks per week.    Dx mammogram/us 01/15/2018 ACR Breast Density Category c: The breast tissue is heterogeneously dense, which may obscure small masses.  FINDINGS: Underlying the palpable marker within the anterior slightly medial left breast is a focal area of distortion. Within the lateral aspect of the left breast there is a 7 mm group of linearly oriented fine punctate calcifications, these are best demonstrated on the CC view. There are additional scattered loosely grouped calcifications throughout the left and right breast.  Mammographic images were processed with CAD.  On physical exam, I palpate a firm mass within the left breast 7 o'clock position.  Targeted ultrasound is performed, showing a 1.2 x 1.4 x 1.3 cm mixed echogenicity mass left breast 7 o'clock position retroareolar location at the site of palpable concern.  Within the left breast 7:30 o'clock retroareolar location, adjacent to the  palpable mass, is a 0.5 x 0.4 x 0.5 cm oval circumscribed hypoechoic mass.  No left axillary adenopathy.  IMPRESSION: 1. Suspicious mass left breast 7 o'clock position corresponding with palpable abnormality. 2. Indeterminate hypoechoic left breast mass 7:30 o'clock, potentially representing a complicated cyst. 3. Indeterminate calcifications within the lateral left breast. Location of these calcifications is not definitely confirmed on the true lateral view.  RECOMMENDATION: 1. Ultrasound-guided core needle biopsy suspicious left breast mass 7 o'clock position. 2. Ultrasound-guided aspiration versus core needle biopsy left breast mass 7:30 o'clock. 3. Stereotactic guided core needle biopsy indeterminate left breast calcifications laterally. Recommend biopsy from a CC approach as the exact location on the true lateral view is indeterminate.  I have discussed the findings and recommendations with the patient. Results were also provided in writing at the conclusion of the visit. If applicable, a reminder letter will be sent to the patient regarding the next appointment.  BI-RADS CATEGORY 4: Suspicious.  pathology 5/22 Diagnosis 1. Breast, left, needle core biopsy, 7:30 o'clock (ribbon clip) - FIBROCYSTIC CHANGES WITH SCLEROSING ADENOSIS AND CALCIFICATIONS. - FIBROADENOMATOID CHANGE. - NO MALIGNANCY IDENTIFIED. 2. Breast, left, needle core biopsy, 7 o'clock position (coil clip) - INVASIVE MAMMARY CARCINOMA, MSBR GRADE I/II. - SEE MICROSCOPIC DESCRIPTION Microscopic Comment Immunohistochemistry for E-Cadherin is strongly positive in the tumor consistent with ductal carcinoma. Estrogen Receptor: 100%, POSITIVE, STRONG STAINING INTENSITY Progesterone Receptor: 50%, POSITIVE, STRONG STAINING INTENSITY Proliferation Marker Ki67: 20% HER2 - NEGATIVE  Labs:  CMET, CBC essentially normal.    Past Surgical History [name], MD; 01/22/2018 5:44  PM) Appendectomy  Breast  Biopsy  Left. Oral Surgery   Diagnostic Studies History [name], MD; 01/22/2018 5:44 PM) Colonoscopy  5-10 years ago Mammogram  within last year Pap Smear  1-5 years ago  Social History [name], MD; 01/22/2018 5:44 PM) Alcohol use  Moderate alcohol use. Caffeine use  Carbonated beverages, Coffee, Tea. No drug use  Tobacco use  Current every day smoker.  Family History [name], MD; 01/22/2018 5:44 PM) Colon Polyps  Father. Melanoma  Sister.  Other Problems [name], MD; 01/22/2018 5:44 PM) General anesthesia - complications  Thyroid Disease     Review of Systems [name] MD; 01/22/2018 5:44 PM) HEENT Present- Wears glasses/contact lenses. Not Present- Earache, Hearing Loss, Hoarseness, Nose Bleed, Oral Ulcers, Ringing in the Ears, Seasonal Allergies, Sinus Pain, Sore Throat, Visual Disturbances and Yellow Eyes. Respiratory Not Present- Bloody sputum, Chronic Cough, Difficulty Breathing, Snoring and Wheezing. Breast Present- Breast Mass. Not Present- Breast Pain, Nipple Discharge and Skin Changes. Cardiovascular Not Present- Chest Pain, Difficulty Breathing Lying Down, Leg Cramps, Palpitations, Rapid Heart Rate, Shortness of Breath and Swelling of Extremities. Gastrointestinal Not Present- Abdominal Pain, Bloating, Bloody Stool, Change in Bowel Habits, Chronic diarrhea, Constipation, Difficulty Swallowing, Excessive gas, Gets full quickly at meals, Hemorrhoids, Indigestion, Nausea, Rectal Pain and Vomiting. Female Genitourinary Not Present- Frequency, Nocturia, Painful Urination, Pelvic Pain and Urgency. Musculoskeletal Not Present- Back Pain, Joint Pain, Joint Stiffness, Muscle Pain, Muscle Weakness and Swelling of Extremities. Neurological Not Present- Decreased Memory, Fainting, Headaches, Numbness, Seizures, Tingling, Tremor, Trouble walking and Weakness. Psychiatric Not Present- Anxiety, Bipolar, Change in Sleep Pattern, Depression, Fearful and  Frequent crying. Endocrine Present- Hot flashes. Not Present- Cold Intolerance, Excessive Hunger, Hair Changes, Heat Intolerance and New Diabetes. Hematology Present- Gland problems. Not Present- Blood Thinners, Easy Bruising, Excessive bleeding, HIV and Persistent Infections.  Vitals [name] MD; 01/23/2018 3:40 PM) 01/23/2018 3:40 PM Weight: 123.6 lb Height: 63in Body Surface Area: 1.58 m Body Mass Index: 21.89 kg/m  Temp.: 98.72F  Pulse: 75 (Regular)  Resp.: 18 (Unlabored)  P.OX: 100% (Room air) BP: 125/68 (Sitting, Left Arm, Standard)       Physical Exam [name] MD; 01/23/2018 3:42 PM) General Mental Status-Alert. General Appearance-Consistent with stated age. Hydration-Well hydrated. Voice-Normal.  Head and Neck Head-normocephalic, atraumatic with no lesions or palpable masses. Trachea-midline. Thyroid Gland Characteristics - normal size and consistency.  Eye Eyeball - Bilateral-Extraocular movements intact. Sclera/Conjunctiva - Bilateral-No scleral icterus.  Chest and Lung Exam Chest and lung exam reveals -quiet, even and easy respiratory effort with no use of accessory muscles and on auscultation, normal breath sounds, no adventitious sounds and normal vocal resonance. Inspection Chest Wall - Normal. Back - normal.  Breast Note: breasts are symmetric bilaterally. left breast wtih palpable 1-1.5 cm mass at 7 o'clock. Mobile. no nipple retraction or skin dimpling. No LAD. right breast normal. Both breasts wtih heterogeneously dense breast tissue.   Cardiovascular Cardiovascular examination reveals -normal heart sounds, regular rate and rhythm with no murmurs and normal pedal pulses bilaterally.  Abdomen Inspection Inspection of the abdomen reveals - No Hernias. Palpation/Percussion Palpation and Percussion of the abdomen reveal - Soft, Non Tender, No Rebound tenderness, No Rigidity (guarding) and No  hepatosplenomegaly. Auscultation Auscultation of the abdomen reveals - Bowel sounds normal.  Neurologic Neurologic evaluation reveals -alert and oriented x 3 with no impairment of recent or remote memory. Mental Status-Normal.  Musculoskeletal Global Assessment -Note: no gross deformities.  Normal Exam - Left-Upper Extremity Strength Normal  and Lower Extremity Strength Normal. Normal Exam - Right-Upper Extremity Strength Normal and Lower Extremity Strength Normal.  Lymphatic Head & Neck  General Head & Neck Lymphatics: Bilateral - Description - Normal. Axillary  General Axillary Region: Bilateral - Description - Normal. Tenderness - Non Tender. Femoral & Inguinal  Generalized Femoral & Inguinal Lymphatics: Bilateral - Description - No Generalized lymphadenopathy.    Assessment & Plan [name] MD; 01/23/2018 3:43 PM) PRIMARY CANCER OF LOWER-INNER QUADRANT OF LEFT FEMALE BREAST (C50.312) Impression: Pt has a new diagnosis of a cT1cN0 left breast cancer. She is recommended to get a lumpectomy/SLN bx followed by radiation, oncotype, and antihormone tx.  I do not think she needs a seed due to the superficial and palpable nature of the mass.  The surgical procedure was described to the patient. I discussed the incision type and location and that we would need radiology involved on with a wire or seed marker and/or sentinel node.  The risks and benefits of the procedure were described to the patient and she wishes to proceed.  We discussed the risks bleeding, infection, damage to other structures, need for further procedures/surgeries. We discussed the risk of seroma. The patient was advised if the area in the breast in cancer, we may need to go back to surgery for additional tissue to obtain negative margins or for a lymph node biopsy. The patient was advised that these are the most common complications, but that others can occur as well. They were advised against taking  aspirin or other anti-inflammatory agents/blood thinners the week before surgery. Current Plans You are being scheduled for surgery- Our schedulers will call you.  You should hear from our office's scheduling department within 5 working days about the location, date, and time of surgery. We try to make accommodations for patient's preferences in scheduling surgery, but sometimes the OR schedule or the surgeon's schedule prevents [place] from making those accommodations.  If you have not heard from our office [phone]) in 5 working days, call the office and ask for your surgeon's nurse.  If you have other questions about your diagnosis, plan, or surgery, call the office and ask for your surgeon's nurse.  Pt Education - [facility] Breast Cancer Information Given - [employer] "Breast Journey" Package Pt Education - flb breast cancer surgery: discussed with patient and provided information.   Signed by [name], MD (01/23/2018 3:45 PM)

## 2018-02-21 ENCOUNTER — Ambulatory Visit (HOSPITAL_COMMUNITY)
Admission: RE | Admit: 2018-02-21 | Discharge: 2018-02-21 | Disposition: A | Discharge status: Home / Self Care | Payer: 59 | Source: Ambulatory Visit | Attending: General Surgery | Admitting: General Surgery

## 2018-02-21 ENCOUNTER — Encounter (HOSPITAL_COMMUNITY)
Admission: RE | Disposition: A | Discharge status: Home / Self Care | Payer: Self-pay | Source: Ambulatory Visit | Attending: General Surgery

## 2018-02-21 ENCOUNTER — Encounter (HOSPITAL_COMMUNITY)
Admission: RE | Admit: 2018-02-21 | Discharge: 2018-02-21 | Disposition: A | Discharge status: Home / Self Care | Payer: 59 | Source: Ambulatory Visit | Attending: General Surgery | Admitting: General Surgery

## 2018-02-21 ENCOUNTER — Encounter (HOSPITAL_COMMUNITY): Payer: Self-pay | Admitting: *Deleted

## 2018-02-21 ENCOUNTER — Ambulatory Visit (HOSPITAL_COMMUNITY): Payer: 59 | Admitting: Emergency Medicine

## 2018-02-21 ENCOUNTER — Ambulatory Visit (HOSPITAL_COMMUNITY): Payer: 59 | Admitting: Certified Registered Nurse Anesthetist

## 2018-02-21 DIAGNOSIS — C50312 Malignant neoplasm of lower-inner quadrant of left female breast: Secondary | ICD-10-CM | POA: Diagnosis not present

## 2018-02-21 DIAGNOSIS — Z170 Estrogen receptor positive status [ER+]: Secondary | ICD-10-CM | POA: Insufficient documentation

## 2018-02-21 DIAGNOSIS — Z79899 Other long term (current) drug therapy: Secondary | ICD-10-CM | POA: Insufficient documentation

## 2018-02-21 DIAGNOSIS — C773 Secondary and unspecified malignant neoplasm of axilla and upper limb lymph nodes: Secondary | ICD-10-CM | POA: Insufficient documentation

## 2018-02-21 DIAGNOSIS — G8918 Other acute postprocedural pain: Secondary | ICD-10-CM | POA: Diagnosis not present

## 2018-02-21 DIAGNOSIS — I471 Supraventricular tachycardia: Secondary | ICD-10-CM | POA: Diagnosis not present

## 2018-02-21 DIAGNOSIS — F17200 Nicotine dependence, unspecified, uncomplicated: Secondary | ICD-10-CM | POA: Insufficient documentation

## 2018-02-21 DIAGNOSIS — E039 Hypothyroidism, unspecified: Secondary | ICD-10-CM | POA: Insufficient documentation

## 2018-02-21 DIAGNOSIS — C50912 Malignant neoplasm of unspecified site of left female breast: Secondary | ICD-10-CM | POA: Diagnosis not present

## 2018-02-21 HISTORY — PX: BREAST LUMPECTOMY WITH AXILLARY LYMPH NODE BIOPSY: SHX5593

## 2018-02-21 SURGERY — BREAST LUMPECTOMY WITH AXILLARY LYMPH NODE BIOPSY
Anesthesia: General | Site: Breast | Laterality: Left

## 2018-02-21 MED ORDER — PROPOFOL 10 MG/ML IV BOLUS
INTRAVENOUS | Status: DC | PRN
  Administered 2018-02-21: 40 mg via INTRAVENOUS
  Administered 2018-02-21: 150 mg via INTRAVENOUS

## 2018-02-21 MED ORDER — LIDOCAINE HCL 1 % IJ SOLN
INTRAMUSCULAR | Status: DC | PRN
  Administered 2018-02-21: 11:00:00

## 2018-02-21 MED ORDER — CELECOXIB 200 MG PO CAPS
ORAL_CAPSULE | ORAL | Status: AC
  Administered 2018-02-21: 200 mg
  Filled 2018-02-21: qty 1

## 2018-02-21 MED ORDER — FENTANYL CITRATE (PF) 100 MCG/2ML IJ SOLN
100.0000 ug | Freq: Once | INTRAMUSCULAR | Status: AC
  Administered 2018-02-21: 100 ug via INTRAVENOUS

## 2018-02-21 MED ORDER — FENTANYL CITRATE (PF) 100 MCG/2ML IJ SOLN: 25.0000 ug | INTRAMUSCULAR | Status: DC | PRN

## 2018-02-21 MED ORDER — 0.9 % SODIUM CHLORIDE (POUR BTL) OPTIME
TOPICAL | Status: DC | PRN
  Administered 2018-02-21: 1000 mL

## 2018-02-21 MED ORDER — MIDAZOLAM HCL 5 MG/5ML IJ SOLN
INTRAMUSCULAR | Status: DC | PRN
  Administered 2018-02-21: 2 mg via INTRAVENOUS

## 2018-02-21 MED ORDER — METHYLENE BLUE 0.5 % INJ SOLN
INTRAVENOUS | Status: AC
  Filled 2018-02-21: qty 10

## 2018-02-21 MED ORDER — CELECOXIB 200 MG PO CAPS: 200.0000 mg | ORAL_CAPSULE | ORAL | Status: AC

## 2018-02-21 MED ORDER — EPHEDRINE SULFATE 50 MG/ML IJ SOLN
INTRAMUSCULAR | Status: DC | PRN
  Administered 2018-02-21 (×2): 5 mg via INTRAVENOUS
  Administered 2018-02-21: 10 mg via INTRAVENOUS

## 2018-02-21 MED ORDER — DEXAMETHASONE SODIUM PHOSPHATE 10 MG/ML IJ SOLN
INTRAMUSCULAR | Status: DC | PRN
  Administered 2018-02-21: 10 mg via INTRAVENOUS

## 2018-02-21 MED ORDER — SODIUM CHLORIDE 0.9 % IJ SOLN
INTRAMUSCULAR | Status: AC
  Filled 2018-02-21: qty 10

## 2018-02-21 MED ORDER — GABAPENTIN 300 MG PO CAPS
ORAL_CAPSULE | ORAL | Status: AC
  Filled 2018-02-21: qty 1

## 2018-02-21 MED ORDER — PHENYLEPHRINE 40 MCG/ML (10ML) SYRINGE FOR IV PUSH (FOR BLOOD PRESSURE SUPPORT)
PREFILLED_SYRINGE | INTRAVENOUS | Status: AC
  Filled 2018-02-21: qty 10

## 2018-02-21 MED ORDER — MIDAZOLAM HCL 2 MG/2ML IJ SOLN
INTRAMUSCULAR | Status: AC
  Filled 2018-02-21: qty 2

## 2018-02-21 MED ORDER — TECHNETIUM TC 99M SULFUR COLLOID FILTERED
1.0000 | Freq: Once | INTRAVENOUS | Status: AC | PRN
  Administered 2018-02-21: 1 via INTRADERMAL

## 2018-02-21 MED ORDER — ACETAMINOPHEN 500 MG PO TABS
ORAL_TABLET | ORAL | Status: AC
  Filled 2018-02-21: qty 2

## 2018-02-21 MED ORDER — LIDOCAINE 2% (20 MG/ML) 5 ML SYRINGE
INTRAMUSCULAR | Status: DC | PRN
  Administered 2018-02-21: 100 mg via INTRAVENOUS

## 2018-02-21 MED ORDER — BUPIVACAINE-EPINEPHRINE (PF) 0.25% -1:200000 IJ SOLN
INTRAMUSCULAR | Status: AC
  Filled 2018-02-21: qty 30

## 2018-02-21 MED ORDER — LIDOCAINE HCL (PF) 1 % IJ SOLN
INTRAMUSCULAR | Status: AC
  Filled 2018-02-21: qty 30

## 2018-02-21 MED ORDER — PHENYLEPHRINE HCL 10 MG/ML IJ SOLN
INTRAMUSCULAR | Status: DC | PRN
  Administered 2018-02-21: 80 ug via INTRAVENOUS
  Administered 2018-02-21: 40 ug via INTRAVENOUS

## 2018-02-21 MED ORDER — ACETAMINOPHEN 500 MG PO TABS
1000.0000 mg | ORAL_TABLET | ORAL | Status: AC
  Administered 2018-02-21: 1000 mg via ORAL

## 2018-02-21 MED ORDER — CEFAZOLIN SODIUM-DEXTROSE 2-4 GM/100ML-% IV SOLN
INTRAVENOUS | Status: AC
  Filled 2018-02-21: qty 100

## 2018-02-21 MED ORDER — ONDANSETRON HCL 4 MG/2ML IJ SOLN
INTRAMUSCULAR | Status: AC
  Filled 2018-02-21: qty 2

## 2018-02-21 MED ORDER — OXYCODONE HCL 5 MG PO TABS: 5.0000 mg | ORAL_TABLET | Freq: Four times a day (QID) | ORAL | 0 refills | Status: DC | PRN

## 2018-02-21 MED ORDER — ONDANSETRON HCL 4 MG/2ML IJ SOLN
INTRAMUSCULAR | Status: DC | PRN
  Administered 2018-02-21: 4 mg via INTRAVENOUS

## 2018-02-21 MED ORDER — FENTANYL CITRATE (PF) 250 MCG/5ML IJ SOLN
INTRAMUSCULAR | Status: AC
Start: 2018-02-21 — End: ?
  Filled 2018-02-21: qty 5

## 2018-02-21 MED ORDER — PROPOFOL 10 MG/ML IV BOLUS
INTRAVENOUS | Status: AC
  Filled 2018-02-21: qty 20

## 2018-02-21 MED ORDER — CEFAZOLIN SODIUM-DEXTROSE 2-4 GM/100ML-% IV SOLN
2.0000 g | INTRAVENOUS | Status: AC
  Administered 2018-02-21: 2 g via INTRAVENOUS

## 2018-02-21 MED ORDER — DIPHENHYDRAMINE HCL 50 MG/ML IJ SOLN
INTRAMUSCULAR | Status: DC | PRN
  Administered 2018-02-21: 12.5 mg via INTRAVENOUS

## 2018-02-21 MED ORDER — MIDAZOLAM HCL 2 MG/2ML IJ SOLN
2.0000 mg | Freq: Once | INTRAMUSCULAR | Status: AC
  Administered 2018-02-21: 2 mg via INTRAVENOUS

## 2018-02-21 MED ORDER — DEXAMETHASONE SODIUM PHOSPHATE 10 MG/ML IJ SOLN
INTRAMUSCULAR | Status: AC
  Filled 2018-02-21: qty 1

## 2018-02-21 MED ORDER — LACTATED RINGERS IV SOLN
INTRAVENOUS | Status: DC | PRN
  Administered 2018-02-21 (×2): via INTRAVENOUS

## 2018-02-21 MED ORDER — GABAPENTIN 300 MG PO CAPS
300.0000 mg | ORAL_CAPSULE | ORAL | Status: AC
  Administered 2018-02-21: 300 mg via ORAL
  Filled 2018-02-21: qty 1

## 2018-02-21 MED ORDER — DIPHENHYDRAMINE HCL 50 MG/ML IJ SOLN
INTRAMUSCULAR | Status: AC
  Filled 2018-02-21: qty 1

## 2018-02-21 MED ORDER — FENTANYL CITRATE (PF) 100 MCG/2ML IJ SOLN
INTRAMUSCULAR | Status: AC
  Filled 2018-02-21: qty 2

## 2018-02-21 SURGICAL SUPPLY — 65 items
BINDER BREAST LRG (GAUZE/BANDAGES/DRESSINGS) ×3 IMPLANT
BINDER BREAST XLRG (GAUZE/BANDAGES/DRESSINGS) IMPLANT
BLADE SURG 10 STRL SS (BLADE) ×3 IMPLANT
BNDG GAUZE ELAST 4 BULKY (GAUZE/BANDAGES/DRESSINGS) IMPLANT
CANISTER SUCT 3000ML PPV (MISCELLANEOUS) ×3 IMPLANT
CHLORAPREP W/TINT 26ML (MISCELLANEOUS) ×3 IMPLANT
CLIP VESOCCLUDE LG 6/CT (CLIP) ×3 IMPLANT
CLIP VESOCCLUDE MED 24/CT (CLIP) ×3 IMPLANT
CLIP VESOCCLUDE MED 6/CT (CLIP) IMPLANT
CLIP VESOCCLUDE SM WIDE 6/CT (CLIP) IMPLANT
CLOSURE WOUND 1/2 X4 (GAUZE/BANDAGES/DRESSINGS) ×1
CONT SPEC 4OZ CLIKSEAL STRL BL (MISCELLANEOUS) ×6 IMPLANT
COVER MAYO STAND STRL (DRAPES) ×3 IMPLANT
COVER PROBE W GEL 5X96 (DRAPES) ×3 IMPLANT
DERMABOND ADVANCED (GAUZE/BANDAGES/DRESSINGS) ×2
DERMABOND ADVANCED .7 DNX12 (GAUZE/BANDAGES/DRESSINGS) ×1 IMPLANT
DRAPE UTILITY XL STRL (DRAPES) ×3 IMPLANT
DRSG PAD ABDOMINAL 8X10 ST (GAUZE/BANDAGES/DRESSINGS) IMPLANT
ELECT CAUTERY BLADE 6.4 (BLADE) ×3 IMPLANT
ELECT REM PT RETURN 9FT ADLT (ELECTROSURGICAL) ×3
ELECTRODE REM PT RTRN 9FT ADLT (ELECTROSURGICAL) ×1 IMPLANT
GAUZE SPONGE 4X4 12PLY STRL (GAUZE/BANDAGES/DRESSINGS) IMPLANT
GAUZE SPONGE 4X4 12PLY STRL LF (GAUZE/BANDAGES/DRESSINGS) ×3 IMPLANT
GLOVE BIO SURGEON STRL SZ 6 (GLOVE) ×3 IMPLANT
GLOVE BIO SURGEON STRL SZ7 (GLOVE) ×3 IMPLANT
GLOVE BIO SURGEON STRL SZ8 (GLOVE) ×3 IMPLANT
GLOVE BIOGEL PI IND STRL 7.0 (GLOVE) ×1 IMPLANT
GLOVE BIOGEL PI IND STRL 8 (GLOVE) ×2 IMPLANT
GLOVE BIOGEL PI INDICATOR 7.0 (GLOVE) ×2
GLOVE BIOGEL PI INDICATOR 8 (GLOVE) ×4
GLOVE INDICATOR 6.5 STRL GRN (GLOVE) ×3 IMPLANT
GOWN STRL REUS W/ TWL LRG LVL3 (GOWN DISPOSABLE) ×3 IMPLANT
GOWN STRL REUS W/TWL 2XL LVL3 (GOWN DISPOSABLE) ×3 IMPLANT
GOWN STRL REUS W/TWL LRG LVL3 (GOWN DISPOSABLE) ×6
ILLUMINATOR WAVEGUIDE N/F (MISCELLANEOUS) IMPLANT
KIT BASIN OR (CUSTOM PROCEDURE TRAY) ×3 IMPLANT
KIT MARKER MARGIN INK (KITS) ×3 IMPLANT
LIGHT WAVEGUIDE WIDE FLAT (MISCELLANEOUS) IMPLANT
NDL SAFETY ECLIPSE 18X1.5 (NEEDLE) IMPLANT
NEEDLE FILTER BLUNT 18X 1/2SAF (NEEDLE)
NEEDLE FILTER BLUNT 18X1 1/2 (NEEDLE) IMPLANT
NEEDLE HYPO 18GX1.5 SHARP (NEEDLE)
NEEDLE HYPO 25GX1X1/2 BEV (NEEDLE) ×3 IMPLANT
NS IRRIG 1000ML POUR BTL (IV SOLUTION) ×3 IMPLANT
PACK SURGICAL SETUP 50X90 (CUSTOM PROCEDURE TRAY) ×3 IMPLANT
PACK UNIVERSAL I (CUSTOM PROCEDURE TRAY) ×3 IMPLANT
PAD ABD 8X10 STRL (GAUZE/BANDAGES/DRESSINGS) ×3 IMPLANT
PENCIL BUTTON HOLSTER BLD 10FT (ELECTRODE) ×3 IMPLANT
SPECIMEN JAR MEDIUM (MISCELLANEOUS) ×3 IMPLANT
SPONGE LAP 18X18 X RAY DECT (DISPOSABLE) ×3 IMPLANT
STOCKINETTE IMPERVIOUS 9X36 MD (GAUZE/BANDAGES/DRESSINGS) ×3 IMPLANT
STRIP CLOSURE SKIN 1/2X4 (GAUZE/BANDAGES/DRESSINGS) ×2 IMPLANT
SUT MON AB 4-0 PC3 18 (SUTURE) ×9 IMPLANT
SUT SILK 2 0 SH (SUTURE) IMPLANT
SUT VIC AB 2-0 SH 27 (SUTURE) ×4
SUT VIC AB 2-0 SH 27XBRD (SUTURE) ×2 IMPLANT
SUT VIC AB 3-0 SH 27 (SUTURE) ×8
SUT VIC AB 3-0 SH 27X BRD (SUTURE) ×4 IMPLANT
SYR BULB 3OZ (MISCELLANEOUS) ×3 IMPLANT
SYR CONTROL 10ML LL (SYRINGE) ×3 IMPLANT
TOWEL OR 17X24 6PK STRL BLUE (TOWEL DISPOSABLE) IMPLANT
TOWEL OR 17X26 10 PK STRL BLUE (TOWEL DISPOSABLE) ×3 IMPLANT
TUBE CONNECTING 12'X1/4 (SUCTIONS) ×1
TUBE CONNECTING 12X1/4 (SUCTIONS) ×2 IMPLANT
YANKAUER SUCT BULB TIP NO VENT (SUCTIONS) ×3 IMPLANT

## 2018-02-21 NOTE — Transfer of Care (Signed)
Immediate Anesthesia Transfer of Care Note  Patient: Martha [name]  Procedure(s) Performed: LEFT BREAST LUMPECTOMY WITH AXILLARY LYMPH NODE BIOPSY (Left Breast)  Patient Location: PACU  Anesthesia Type:General  Level of Consciousness: awake, alert  and oriented  Airway & Oxygen Therapy: Patient Spontanous Breathing  Post-op Assessment: Report given to RN and Post -op Vital signs reviewed and stable  Post vital signs: Reviewed and stable  Last Vitals:  Vitals Value Taken Time  BP    Temp    Pulse 71 02/21/2018 12:18 PM  Resp 12 02/21/2018 12:18 PM  SpO2 97 % 02/21/2018 12:18 PM  Vitals shown include unvalidated device data.  Last Pain:  Vitals:   02/21/18 0858  TempSrc:   PainSc: 0-No pain         Complications: No apparent anesthesia complications

## 2018-02-21 NOTE — Op Note (Signed)
Left Breast Lumpectomy with Sentinel Node Biopsy Procedure Note  Indications: This patient presents with history of left breast cancer with clinically negative axillary lymph node exam.  Pre-operative Diagnosis: left breast cancer, cT1c, grade 2, +/+/-  Post-operative Diagnosis: left breast cancer  Surgeon: [name]   Assistants: Gillermo Murdoch, RNFA  Anesthesia: General LMA anesthesia, Local anesthesia 1% plain lidocaine, 0.25.% bupivacaine and pectoral block  ASA Class: 3  Procedure Details  The patient was seen in the Holding Room. The risks, benefits, complications, treatment options, and expected outcomes were discussed with the patient. The possibilities of reaction to medication, pulmonary aspiration, bleeding, infection, the need for additional procedures, failure to diagnose a condition, and creating a complication requiring transfusion or operation were discussed with the patient. The patient concurred with the proposed plan, giving informed consent.  The site of surgery properly noted/marked. The patient was taken to Operating Room # 2, identified as Martha [name] and the procedure verified as Left Breast Lumpectomy and Sentinel Node Biopsy.  After induction of anesthesia, the left arm, breast, and chest were prepped and draped in standard fashion. A Time Out was held and the above information confirmed.   The lumpectomy was performed by creating a circumareolar elliptical incision over the inferolateral nipple.  The skin was tethered overlying the cancer, so the skin was excised en bloc with the mass.   Dissection was carried down around the mass to grossly negative breast tissue.  The mass was palpable, but it was xrayed and the clip was present in the specimen.   Hemostasis was achieved with cautery.  The wound was irrigated.  The underlying breast tissue below the nipple was mobilized and mastopexy was performed with a 2-0 vicryl in order to minimize nipple retraction.  The  skin was then closed with a 3-0 Vicryl deep dermal interrupted and a 4-0 Monocryl subcuticular closure in layers.   Using a hand-held gamma probe, axillary sentinel nodes were identified transcutaneously.  An oblique incision was created below the axillary hairline.  Dissection was carried through the clavipectoral fascia.  Two deep level 2 axillary sentinel nodes were removed and submitted to pathology. The axillary incision was closed with 3-0 vicryl deep dermal interrupted sutures and 4-0 monocryl subcuticular closure in layers.    Sterile dressings were applied. At the end of the operation, all sponge, instrument, and needle counts were correct.  Findings: grossly clear surgical margins, two sentinel lymph nodes, highest cps 320.  Background 4  Estimated Blood Loss:  Minimal         Drains: none         Specimens: left breast lumpectomy, two left axillary sentinel lymph nodes                Complications:  None; patient tolerated the procedure well.         Disposition: PACU - hemodynamically stable.         Condition: stable

## 2018-02-21 NOTE — Anesthesia Procedure Notes (Signed)
Procedure Name: LMA Insertion Date/Time: 02/21/2018 10:50 AM Performed by: Catalina Gravel, MD Pre-anesthesia Checklist: Patient identified, Emergency Drugs available, Suction available and Patient being monitored Patient Re-evaluated:Patient Re-evaluated prior to induction Oxygen Delivery Method: Circle System Utilized Preoxygenation: Pre-oxygenation with 100% oxygen Induction Type: IV induction Ventilation: Mask ventilation without difficulty LMA: LMA inserted LMA Size: 4.0 Number of attempts: 1 Airway Equipment and Method: Bite block Placement Confirmation: positive ETCO2 Tube secured with: Tape Dental Injury: Teeth and Oropharynx as per pre-operative assessment  Comments: Placed by Katharina Caper under supervision of CRNA and MD

## 2018-02-21 NOTE — Discharge Instructions (Addendum)
[facility] °Office Phone Number [phone] ° °BREAST BIOPSY/ PARTIAL MASTECTOMY: POST OP INSTRUCTIONS ° °Always review your discharge instruction sheet given to you by the facility where your surgery was performed. ° °IF YOU HAVE DISABILITY OR FAMILY LEAVE FORMS, YOU MUST BRING THEM TO THE OFFICE FOR PROCESSING.  DO NOT GIVE THEM TO YOUR DOCTOR. ° °1. A prescription for pain medication may be given to you upon discharge.  Take your pain medication as prescribed, if needed.  If narcotic pain medicine is not needed, then you may take acetaminophen (Tylenol) or ibuprofen (Advil) as needed. °2. Take your usually prescribed medications unless otherwise directed °3. If you need a refill on your pain medication, please contact your pharmacy.  They will contact our office to request authorization.  Prescriptions will not be filled after 5pm or on week-ends. °4. You should eat very light the first 24 hours after surgery, such as soup, crackers, pudding, etc.  Resume your normal diet the day after surgery. °5. Most patients will experience some swelling and bruising in the breast.  Ice packs and a good support bra will help.  Swelling and bruising can take several days to resolve.  °6. It is common to experience some constipation if taking pain medication after surgery.  Increasing fluid intake and taking a stool softener will usually help or prevent this problem from occurring.  A mild laxative (Milk of Magnesia or Miralax) should be taken according to package directions if there are no bowel movements after 48 hours. °7. Unless discharge instructions indicate otherwise, you may remove your bandages 48 hours after surgery, and you may shower at that time.  You may have steri-strips (small skin tapes) in place directly over the incision.  These strips should be left on the skin for 7-10 days.   Any sutures or staples will be removed at the office during your follow-up visit. °8. ACTIVITIES:  You may resume  regular daily activities (gradually increasing) beginning the next day.  Wearing a good support bra or sports bra (or the breast binder) minimizes pain and swelling.  You may have sexual intercourse when it is comfortable. °a. You may drive when you no longer are taking prescription pain medication, you can comfortably wear a seatbelt, and you can safely maneuver your car and apply brakes. °b. RETURN TO WORK:  __________1 week_______________ °9. You should see your doctor in the office for a follow-up appointment approximately two weeks after your surgery.  Your doctor’s nurse will typically make your follow-up appointment when she calls you with your pathology report.  Expect your pathology report 2-3 business days after your surgery.  You may call to check if you do not hear from us after three days. ° ° °WHEN TO CALL YOUR DOCTOR: °1. Fever over 101.0 °2. Nausea and/or vomiting. °3. Extreme swelling or bruising. °4. Continued bleeding from incision. °5. Increased pain, redness, or drainage from the incision. ° °The clinic staff is available to answer your questions during regular business hours.  Please don’t hesitate to call and ask to speak to one of the nurses for clinical concerns.  If you have a medical emergency, go to the nearest emergency room or call 911.  A surgeon from [facility] is always on call at the hospital. ° °For further questions, please visit [email or web address]  ° °

## 2018-02-21 NOTE — Interval H&P Note (Signed)
History and Physical Interval Note:  02/21/2018 9:50 AM  Martha [name]  has presented today for surgery, with the diagnosis of LEFT BREAST CANCER  The various methods of treatment have been discussed with the patient and family. After consideration of risks, benefits and other options for treatment, the patient has consented to  Procedure(s): BREAST LUMPECTOMY WITH AXILLARY LYMPH NODE BIOPSY (Left) as a surgical intervention .  The patient's history has been reviewed, patient examined, no change in status, stable for surgery.  I have reviewed the patient's chart and labs.  Questions were answered to the patient's satisfaction.     [name]

## 2018-02-21 NOTE — Anesthesia Procedure Notes (Addendum)
Anesthesia Regional Block: Pectoralis block   Pre-Anesthetic Checklist: ,, timeout performed, Correct Patient, Correct Site, Correct Laterality, Correct Procedure, Correct Position, site marked, Risks and benefits discussed,  Surgical consent,  Pre-op evaluation,  At surgeon's request and post-op pain management  Laterality: Left  Prep: chloraprep       Needles:   Needle Type: Stimulator Needle - 40          Additional Needles:   Procedures: Doppler guided,,,, ultrasound used (permanent image in chart),,,,  Narrative:  Start time: 02/21/2018 9:55 AM End time: 02/21/2018 10:10 AM Injection made incrementally with aspirations every 5 mL.  Performed by: Personally  Anesthesiologist: Belinda Block, MD

## 2018-02-21 NOTE — Anesthesia Postprocedure Evaluation (Signed)
Anesthesia Post Note  Patient: Serai Tukes  Procedure(s) Performed: LEFT BREAST LUMPECTOMY WITH AXILLARY LYMPH NODE BIOPSY (Left Breast)     Patient location during evaluation: PACU Anesthesia Type: General Level of consciousness: awake and alert Pain management: pain level controlled Vital Signs Assessment: post-procedure vital signs reviewed and stable Respiratory status: spontaneous breathing, nonlabored ventilation and respiratory function stable Cardiovascular status: blood pressure returned to baseline and stable Postop Assessment: no apparent nausea or vomiting Anesthetic complications: no    Last Vitals:  Vitals:   02/21/18 1245 02/21/18 1253  BP: 136/77 (!) 151/78  Pulse:    Resp:  15  Temp: 36.6 C   SpO2:  98%    Last Pain:  Vitals:   02/21/18 1253  TempSrc:   PainSc: 0-No pain                 Catalina Gravel

## 2018-02-21 NOTE — Anesthesia Preprocedure Evaluation (Addendum)
Anesthesia Evaluation  Patient identified by MRN, date of birth, ID band  Reviewed: Allergy & Precautions, NPO status , Patient's Chart, lab work & pertinent test results  History of Anesthesia Complications (+) PONV  Airway Mallampati: II  TM Distance: >3 FB     Dental   Pulmonary Current Smoker,    breath sounds clear to auscultation       Cardiovascular + dysrhythmias Supra Ventricular Tachycardia  Rhythm:Regular Rate:Normal     Neuro/Psych    GI/Hepatic negative GI ROS, Neg liver ROS,   Endo/Other  Hypothyroidism   Renal/GU negative Renal ROS     Musculoskeletal   Abdominal   Peds  Hematology   Anesthesia Other Findings   Reproductive/Obstetrics                            Anesthesia Physical Anesthesia Plan  ASA: III  Anesthesia Plan: General   Post-op Pain Management:  Regional for Post-op pain   Induction: Intravenous, Rapid sequence and Cricoid pressure planned  PONV Risk Score and Plan: Treatment may vary due to age or medical condition, Ondansetron, Dexamethasone and Midazolam  Airway Management Planned: Oral ETT  Additional Equipment:   Intra-op Plan:   Post-operative Plan: Extubation in OR  Informed Consent: I have reviewed the patients History and Physical, chart, labs and discussed the procedure including the risks, benefits and alternatives for the proposed anesthesia with the patient or authorized representative who has indicated his/her understanding and acceptance.   Dental advisory given  Plan Discussed with: CRNA, Anesthesiologist and Surgeon  Anesthesia Plan Comments:        Anesthesia Quick Evaluation

## 2018-02-22 ENCOUNTER — Encounter (HOSPITAL_COMMUNITY): Payer: Self-pay | Admitting: General Surgery

## 2018-03-01 ENCOUNTER — Telehealth: Payer: Self-pay | Admitting: *Deleted

## 2018-03-01 NOTE — Telephone Encounter (Signed)
Ordered mammaprint per Dr. Burr Medico.  Requisition faxed to pathology and [facility].

## 2018-03-04 ENCOUNTER — Telehealth: Payer: Self-pay | Admitting: General Surgery

## 2018-03-04 NOTE — Telephone Encounter (Signed)
No answer.  Left message on machine.  Will plan callback tomorrow.

## 2018-03-05 ENCOUNTER — Other Ambulatory Visit: Payer: Self-pay | Admitting: General Surgery

## 2018-03-05 ENCOUNTER — Encounter: Payer: Self-pay | Admitting: Family Medicine

## 2018-03-05 ENCOUNTER — Telehealth: Payer: Self-pay | Admitting: General Surgery

## 2018-03-05 NOTE — Telephone Encounter (Signed)
Discussed pathology with patient.  Will plan takeback for margins.  Will schedule after mammaprint should be back in order to do port at the same time if high risk.

## 2018-03-11 ENCOUNTER — Telehealth: Payer: Self-pay | Admitting: *Deleted

## 2018-03-11 ENCOUNTER — Other Ambulatory Visit: Payer: Self-pay | Admitting: *Deleted

## 2018-03-11 DIAGNOSIS — C50312 Malignant neoplasm of lower-inner quadrant of left female breast: Secondary | ICD-10-CM

## 2018-03-11 DIAGNOSIS — Z170 Estrogen receptor positive status [ER+]: Principal | ICD-10-CM

## 2018-03-11 NOTE — Telephone Encounter (Signed)
Received Mammaprint result of Low Risk. Physician team notified. Left vm with results and for pt to return call to discuss. Referral placed for appt with Dr. Isidore Moos for xrt.

## 2018-03-12 ENCOUNTER — Encounter (HOSPITAL_COMMUNITY): Payer: Self-pay | Admitting: Hematology

## 2018-03-13 ENCOUNTER — Encounter: Payer: Self-pay | Admitting: Radiation Oncology

## 2018-03-13 ENCOUNTER — Encounter (HOSPITAL_BASED_OUTPATIENT_CLINIC_OR_DEPARTMENT_OTHER): Payer: Self-pay | Admitting: *Deleted

## 2018-03-13 ENCOUNTER — Other Ambulatory Visit: Payer: Self-pay

## 2018-03-20 ENCOUNTER — Encounter (HOSPITAL_BASED_OUTPATIENT_CLINIC_OR_DEPARTMENT_OTHER): Payer: Self-pay | Admitting: Anesthesiology

## 2018-03-20 ENCOUNTER — Encounter (HOSPITAL_BASED_OUTPATIENT_CLINIC_OR_DEPARTMENT_OTHER)
Admission: RE | Disposition: A | Discharge status: Home / Self Care | Payer: Self-pay | Source: Ambulatory Visit | Attending: General Surgery

## 2018-03-20 ENCOUNTER — Ambulatory Visit (HOSPITAL_BASED_OUTPATIENT_CLINIC_OR_DEPARTMENT_OTHER): Payer: 59 | Admitting: Anesthesiology

## 2018-03-20 ENCOUNTER — Ambulatory Visit (HOSPITAL_BASED_OUTPATIENT_CLINIC_OR_DEPARTMENT_OTHER)
Admission: RE | Admit: 2018-03-20 | Discharge: 2018-03-20 | Disposition: A | Discharge status: Home / Self Care | Payer: 59 | Source: Ambulatory Visit | Attending: General Surgery | Admitting: General Surgery

## 2018-03-20 DIAGNOSIS — Z79890 Hormone replacement therapy (postmenopausal): Secondary | ICD-10-CM | POA: Diagnosis not present

## 2018-03-20 DIAGNOSIS — F17200 Nicotine dependence, unspecified, uncomplicated: Secondary | ICD-10-CM | POA: Insufficient documentation

## 2018-03-20 DIAGNOSIS — C50312 Malignant neoplasm of lower-inner quadrant of left female breast: Secondary | ICD-10-CM | POA: Insufficient documentation

## 2018-03-20 DIAGNOSIS — Z79899 Other long term (current) drug therapy: Secondary | ICD-10-CM | POA: Diagnosis not present

## 2018-03-20 DIAGNOSIS — E039 Hypothyroidism, unspecified: Secondary | ICD-10-CM | POA: Insufficient documentation

## 2018-03-20 DIAGNOSIS — N6022 Fibroadenosis of left breast: Secondary | ICD-10-CM | POA: Insufficient documentation

## 2018-03-20 DIAGNOSIS — N6012 Diffuse cystic mastopathy of left breast: Secondary | ICD-10-CM | POA: Diagnosis not present

## 2018-03-20 HISTORY — PX: RE-EXCISION OF BREAST LUMPECTOMY: SHX6048

## 2018-03-20 SURGERY — EXCISION, LESION, BREAST
Anesthesia: General | Site: Breast | Laterality: Left

## 2018-03-20 MED ORDER — PROMETHAZINE HCL 25 MG/ML IJ SOLN: 6.2500 mg | INTRAMUSCULAR | Status: DC | PRN

## 2018-03-20 MED ORDER — CHLORHEXIDINE GLUCONATE CLOTH 2 % EX PADS: 6.0000 | MEDICATED_PAD | Freq: Once | CUTANEOUS | Status: DC

## 2018-03-20 MED ORDER — CEFAZOLIN SODIUM-DEXTROSE 2-4 GM/100ML-% IV SOLN
INTRAVENOUS | Status: AC
  Filled 2018-03-20: qty 100

## 2018-03-20 MED ORDER — ONDANSETRON HCL 4 MG/2ML IJ SOLN
INTRAMUSCULAR | Status: DC | PRN
  Administered 2018-03-20: 4 mg via INTRAVENOUS

## 2018-03-20 MED ORDER — LIDOCAINE HCL (CARDIAC) PF 100 MG/5ML IV SOSY
PREFILLED_SYRINGE | INTRAVENOUS | Status: AC
  Filled 2018-03-20: qty 5

## 2018-03-20 MED ORDER — EPHEDRINE SULFATE 50 MG/ML IJ SOLN
INTRAMUSCULAR | Status: DC | PRN
  Administered 2018-03-20 (×2): 10 mg via INTRAVENOUS

## 2018-03-20 MED ORDER — PHENYLEPHRINE 40 MCG/ML (10ML) SYRINGE FOR IV PUSH (FOR BLOOD PRESSURE SUPPORT)
PREFILLED_SYRINGE | INTRAVENOUS | Status: AC
  Filled 2018-03-20: qty 10

## 2018-03-20 MED ORDER — KETOROLAC TROMETHAMINE 30 MG/ML IJ SOLN: 30.0000 mg | Freq: Once | INTRAMUSCULAR | Status: DC | PRN

## 2018-03-20 MED ORDER — SCOPOLAMINE 1 MG/3DAYS TD PT72: 1.0000 | MEDICATED_PATCH | Freq: Once | TRANSDERMAL | Status: DC | PRN

## 2018-03-20 MED ORDER — PROPOFOL 10 MG/ML IV BOLUS
INTRAVENOUS | Status: DC | PRN
  Administered 2018-03-20: 150 mg via INTRAVENOUS

## 2018-03-20 MED ORDER — PROPOFOL 500 MG/50ML IV EMUL
INTRAVENOUS | Status: AC
  Filled 2018-03-20: qty 50

## 2018-03-20 MED ORDER — LIDOCAINE-EPINEPHRINE (PF) 1 %-1:200000 IJ SOLN
INTRAMUSCULAR | Status: DC | PRN
  Administered 2018-03-20: 20 mL via INTRAMUSCULAR

## 2018-03-20 MED ORDER — FENTANYL CITRATE (PF) 100 MCG/2ML IJ SOLN
INTRAMUSCULAR | Status: AC
  Filled 2018-03-20: qty 2

## 2018-03-20 MED ORDER — FENTANYL CITRATE (PF) 100 MCG/2ML IJ SOLN
50.0000 ug | INTRAMUSCULAR | Status: DC | PRN
  Administered 2018-03-20: 100 ug via INTRAVENOUS

## 2018-03-20 MED ORDER — LIDOCAINE HCL (CARDIAC) PF 100 MG/5ML IV SOSY
PREFILLED_SYRINGE | INTRAVENOUS | Status: DC | PRN
  Administered 2018-03-20: 60 mg via INTRAVENOUS

## 2018-03-20 MED ORDER — DEXAMETHASONE SODIUM PHOSPHATE 4 MG/ML IJ SOLN
INTRAMUSCULAR | Status: DC | PRN
  Administered 2018-03-20: 10 mg via INTRAVENOUS

## 2018-03-20 MED ORDER — CELECOXIB 200 MG PO CAPS
ORAL_CAPSULE | ORAL | Status: AC
  Filled 2018-03-20: qty 1

## 2018-03-20 MED ORDER — DEXAMETHASONE SODIUM PHOSPHATE 10 MG/ML IJ SOLN
INTRAMUSCULAR | Status: AC
  Filled 2018-03-20: qty 1

## 2018-03-20 MED ORDER — FENTANYL CITRATE (PF) 100 MCG/2ML IJ SOLN: 25.0000 ug | INTRAMUSCULAR | Status: DC | PRN

## 2018-03-20 MED ORDER — CELECOXIB 200 MG PO CAPS
200.0000 mg | ORAL_CAPSULE | ORAL | Status: AC
  Administered 2018-03-20: 200 mg via ORAL

## 2018-03-20 MED ORDER — ONDANSETRON HCL 4 MG/2ML IJ SOLN
INTRAMUSCULAR | Status: AC
  Filled 2018-03-20: qty 2

## 2018-03-20 MED ORDER — ACETAMINOPHEN 500 MG PO TABS
1000.0000 mg | ORAL_TABLET | ORAL | Status: AC
  Administered 2018-03-20: 1000 mg via ORAL

## 2018-03-20 MED ORDER — LACTATED RINGERS IV SOLN
INTRAVENOUS | Status: DC
  Administered 2018-03-20 (×2): via INTRAVENOUS

## 2018-03-20 MED ORDER — OXYCODONE HCL 5 MG/5ML PO SOLN: 5.0000 mg | Freq: Once | ORAL | Status: DC | PRN

## 2018-03-20 MED ORDER — GABAPENTIN 300 MG PO CAPS
300.0000 mg | ORAL_CAPSULE | ORAL | Status: AC
  Administered 2018-03-20: 300 mg via ORAL

## 2018-03-20 MED ORDER — SUCCINYLCHOLINE CHLORIDE 200 MG/10ML IV SOSY
PREFILLED_SYRINGE | INTRAVENOUS | Status: AC
  Filled 2018-03-20: qty 10

## 2018-03-20 MED ORDER — CEFAZOLIN SODIUM-DEXTROSE 2-4 GM/100ML-% IV SOLN
2.0000 g | INTRAVENOUS | Status: AC
  Administered 2018-03-20: 2 g via INTRAVENOUS

## 2018-03-20 MED ORDER — MIDAZOLAM HCL 2 MG/2ML IJ SOLN
1.0000 mg | INTRAMUSCULAR | Status: DC | PRN
  Administered 2018-03-20: 2 mg via INTRAVENOUS

## 2018-03-20 MED ORDER — EPHEDRINE SULFATE 50 MG/ML IJ SOLN
INTRAMUSCULAR | Status: AC
  Filled 2018-03-20: qty 1

## 2018-03-20 MED ORDER — ACETAMINOPHEN 500 MG PO TABS
ORAL_TABLET | ORAL | Status: AC
  Filled 2018-03-20: qty 2

## 2018-03-20 MED ORDER — MIDAZOLAM HCL 2 MG/2ML IJ SOLN
INTRAMUSCULAR | Status: AC
  Filled 2018-03-20: qty 2

## 2018-03-20 MED ORDER — ROCURONIUM BROMIDE 10 MG/ML (PF) SYRINGE
PREFILLED_SYRINGE | INTRAVENOUS | Status: AC
  Filled 2018-03-20: qty 10

## 2018-03-20 MED ORDER — OXYCODONE HCL 5 MG PO TABS: 5.0000 mg | ORAL_TABLET | Freq: Once | ORAL | Status: DC | PRN

## 2018-03-20 MED ORDER — GABAPENTIN 300 MG PO CAPS
ORAL_CAPSULE | ORAL | Status: AC
  Filled 2018-03-20: qty 1

## 2018-03-20 SURGICAL SUPPLY — 44 items
BLADE SURG 10 STRL SS (BLADE) ×3 IMPLANT
CANISTER SUCT 1200ML W/VALVE (MISCELLANEOUS) ×3 IMPLANT
CHLORAPREP W/TINT 26ML (MISCELLANEOUS) ×3 IMPLANT
CLIP VESOCCLUDE LG 6/CT (CLIP) ×3 IMPLANT
CLOSURE WOUND 1/2 X4 (GAUZE/BANDAGES/DRESSINGS) ×1
COVER BACK TABLE 60X90IN (DRAPES) ×3 IMPLANT
COVER MAYO STAND STRL (DRAPES) ×3 IMPLANT
DECANTER SPIKE VIAL GLASS SM (MISCELLANEOUS) IMPLANT
DERMABOND ADVANCED (GAUZE/BANDAGES/DRESSINGS) ×2
DERMABOND ADVANCED .7 DNX12 (GAUZE/BANDAGES/DRESSINGS) ×1 IMPLANT
DRAPE LAPAROSCOPIC ABDOMINAL (DRAPES) ×3 IMPLANT
DRAPE UTILITY XL STRL (DRAPES) ×3 IMPLANT
ELECT COATED BLADE 2.86 ST (ELECTRODE) ×3 IMPLANT
ELECT REM PT RETURN 9FT ADLT (ELECTROSURGICAL) ×3
ELECTRODE REM PT RTRN 9FT ADLT (ELECTROSURGICAL) ×1 IMPLANT
GAUZE SPONGE 4X4 12PLY STRL LF (GAUZE/BANDAGES/DRESSINGS) ×3 IMPLANT
GLOVE BIO SURGEON STRL SZ 6 (GLOVE) ×3 IMPLANT
GLOVE BIOGEL PI IND STRL 6.5 (GLOVE) ×1 IMPLANT
GLOVE BIOGEL PI INDICATOR 6.5 (GLOVE) ×2
GOWN STRL REUS W/ TWL LRG LVL3 (GOWN DISPOSABLE) ×1 IMPLANT
GOWN STRL REUS W/TWL 2XL LVL3 (GOWN DISPOSABLE) ×3 IMPLANT
GOWN STRL REUS W/TWL LRG LVL3 (GOWN DISPOSABLE) ×2
KIT MARKER MARGIN INK (KITS) ×3 IMPLANT
NEEDLE HYPO 25X1 1.5 SAFETY (NEEDLE) ×3 IMPLANT
NS IRRIG 1000ML POUR BTL (IV SOLUTION) ×3 IMPLANT
PACK BASIN DAY SURGERY FS (CUSTOM PROCEDURE TRAY) ×3 IMPLANT
PENCIL BUTTON HOLSTER BLD 10FT (ELECTRODE) ×3 IMPLANT
SLEEVE SCD COMPRESS KNEE MED (MISCELLANEOUS) ×3 IMPLANT
SPONGE LAP 18X18 RF (DISPOSABLE) ×3 IMPLANT
STAPLER VISISTAT 35W (STAPLE) IMPLANT
STRIP CLOSURE SKIN 1/2X4 (GAUZE/BANDAGES/DRESSINGS) ×2 IMPLANT
SUT MON AB 4-0 PC3 18 (SUTURE) ×3 IMPLANT
SUT SILK 2 0 SH (SUTURE) IMPLANT
SUT VIC AB 3-0 54X BRD REEL (SUTURE) IMPLANT
SUT VIC AB 3-0 BRD 54 (SUTURE)
SUT VIC AB 3-0 SH 27 (SUTURE) ×2
SUT VIC AB 3-0 SH 27X BRD (SUTURE) ×1 IMPLANT
SYR BULB 3OZ (MISCELLANEOUS) ×3 IMPLANT
SYR CONTROL 10ML LL (SYRINGE) ×3 IMPLANT
TOWEL GREEN STERILE FF (TOWEL DISPOSABLE) ×3 IMPLANT
TOWEL OR NON WOVEN STRL DISP B (DISPOSABLE) ×3 IMPLANT
TUBE CONNECTING 20'X1/4 (TUBING) ×1
TUBE CONNECTING 20X1/4 (TUBING) ×2 IMPLANT
YANKAUER SUCT BULB TIP NO VENT (SUCTIONS) ×3 IMPLANT

## 2018-03-20 NOTE — Anesthesia Preprocedure Evaluation (Signed)
Anesthesia Evaluation  Patient identified by MRN, date of birth, ID band Patient awake    Reviewed: Allergy & Precautions, NPO status , Patient's Chart, lab work & pertinent test results  History of Anesthesia Complications (+) PONV  Airway Mallampati: II  TM Distance: >3 FB Neck ROM: Full    Dental no notable dental hx.    Pulmonary Current Smoker,    Pulmonary exam normal breath sounds clear to auscultation       Cardiovascular negative cardio ROS Normal cardiovascular exam Rhythm:Regular Rate:Normal     Neuro/Psych negative neurological ROS  negative psych ROS   GI/Hepatic negative GI ROS, Neg liver ROS,   Endo/Other  Hypothyroidism   Renal/GU negative Renal ROS  negative genitourinary   Musculoskeletal negative musculoskeletal ROS (+)   Abdominal   Peds negative pediatric ROS (+)  Hematology negative hematology ROS (+)   Anesthesia Other Findings   Reproductive/Obstetrics negative OB ROS                             Anesthesia Physical Anesthesia Plan  ASA: II  Anesthesia Plan: General   Post-op Pain Management:    Induction: Intravenous  PONV Risk Score and Plan: 3 and Ondansetron, Dexamethasone and Treatment may vary due to age or medical condition  Airway Management Planned: LMA  Additional Equipment:   Intra-op Plan:   Post-operative Plan: Extubation in OR  Informed Consent: I have reviewed the patients History and Physical, chart, labs and discussed the procedure including the risks, benefits and alternatives for the proposed anesthesia with the patient or authorized representative who has indicated his/her understanding and acceptance.   Dental advisory given  Plan Discussed with: CRNA and Surgeon  Anesthesia Plan Comments:         Anesthesia Quick Evaluation

## 2018-03-20 NOTE — Interval H&P Note (Signed)
History and Physical Interval Note:  03/20/2018 9:09 AM  Martha [name]  has presented today for surgery, with the diagnosis of LEFT BREAST CANCER with positive margins after lumpectomy.  Mammaprint was low risk.  The various methods of treatment have been discussed with the patient and family. After consideration of risks, benefits and other options for treatment, the patient has consented to  Procedure(s): RE-EXCISION OF BREAST LUMPECTOMY (Left) as a surgical intervention .  The patient's history has been reviewed, patient examined, no change in status, stable for surgery.  I have reviewed the patient's chart and labs.  Questions were answered to the patient's satisfaction.     [name]

## 2018-03-20 NOTE — Transfer of Care (Signed)
Immediate Anesthesia Transfer of Care Note  Patient: Martha [name]  Procedure(s) Performed: RE-EXCISION OF BREAST LUMPECTOMY (Left Breast)  Patient Location: PACU  Anesthesia Type:General  Level of Consciousness: awake, alert  and oriented  Airway & Oxygen Therapy: Patient Spontanous Breathing and Patient connected to face mask oxygen  Post-op Assessment: Report given to RN and Post -op Vital signs reviewed and stable  Post vital signs: Reviewed and stable  Last Vitals:  Vitals Value Taken Time  BP    Temp    Pulse 86 03/20/2018 10:32 AM  Resp    SpO2 97 % 03/20/2018 10:32 AM  Vitals shown include unvalidated device data.  Last Pain:  Vitals:   03/20/18 0900  TempSrc: Oral  PainSc: 0-No pain         Complications: No apparent anesthesia complications

## 2018-03-20 NOTE — Discharge Instructions (Addendum)
[place] Office Phone Number [phone]  BREAST BIOPSY/ PARTIAL MASTECTOMY: POST OP INSTRUCTIONS  Always review your discharge instruction sheet given to you by the facility where your surgery was performed.  IF YOU HAVE DISABILITY OR FAMILY LEAVE FORMS, YOU MUST BRING THEM TO THE OFFICE FOR PROCESSING.  DO NOT GIVE THEM TO YOUR DOCTOR.  1. A prescription for pain medication may be given to you upon discharge.  Take your pain medication as prescribed, if needed.  If narcotic pain medicine is not needed, then you may take acetaminophen (Tylenol) or ibuprofen (Advil) as needed. 2. Take your usually prescribed medications unless otherwise directed 3. If you need a refill on your pain medication, please contact your pharmacy.  They will contact our office to request authorization.  Prescriptions will not be filled after 5pm or on week-ends. 4. You should eat very light the first 24 hours after surgery, such as soup, crackers, pudding, etc.  Resume your normal diet the day after surgery. 5. Most patients will experience some swelling and bruising in the breast.  Ice packs and a good support bra will help.  Swelling and bruising can take several days to resolve.  6. It is common to experience some constipation if taking pain medication after surgery.  Increasing fluid intake and taking a stool softener will usually help or prevent this problem from occurring.  A mild laxative (Milk of Magnesia or Miralax) should be taken according to package directions if there are no bowel movements after 48 hours. 7. Unless discharge instructions indicate otherwise, you may remove your bandages 48 hours after surgery, and you may shower at that time.  You may have steri-strips (small skin tapes) in place directly over the incision.  These strips should be left on the skin for 7-10 days.   Any sutures or staples will be removed at the office during your follow-up visit. 8. ACTIVITIES:  You may resume  regular daily activities (gradually increasing) beginning the next day.  Wearing a good support bra or sports bra (or the breast binder) minimizes pain and swelling.  You may have sexual intercourse when it is comfortable. a. You may drive when you no longer are taking prescription pain medication, you can comfortably wear a seatbelt, and you can safely maneuver your car and apply brakes. b. RETURN TO WORK:  __________1 week_______________ 9. You should see your doctor in the office for a follow-up appointment approximately two weeks after your surgery.  Your doctors nurse will typically make your follow-up appointment when she calls you with your pathology report.  Expect your pathology report 2-3 business days after your surgery.  You may call to check if you do not hear from [place] after three days.   WHEN TO CALL YOUR DOCTOR: 1. Fever over 101.0 2. Nausea and/or vomiting. 3. Extreme swelling or bruising. 4. Continued bleeding from incision. 5. Increased pain, redness, or drainage from the incision.  The clinic staff is available to answer your questions during regular business hours.  Please dont hesitate to call and ask to speak to one of the nurses for clinical concerns.  If you have a medical emergency, go to the nearest emergency room or call 911.  A surgeon from [facility] is always on call at the hospital.  For further questions, please visit [email or web address]     Tylenol 1,000mg  given today at 9:00am   Post Anesthesia Home Care Instructions  Activity: Get plenty of rest for the remainder of the day.  A responsible individual must stay with you for 24 hours following the procedure.  For the next 24 hours, DO NOT: -Drive a car -Paediatric nurse -Drink alcoholic beverages -Take any medication unless instructed by your physician -Make any legal decisions or sign important papers.  Meals: Start with liquid foods such as gelatin or soup. Progress to regular  foods as tolerated. Avoid greasy, spicy, heavy foods. If nausea and/or vomiting occur, drink only clear liquids until the nausea and/or vomiting subsides. Call your physician if vomiting continues.  Special Instructions/Symptoms: Your throat may feel dry or sore from the anesthesia or the breathing tube placed in your throat during surgery. If this causes discomfort, gargle with warm salt water. The discomfort should disappear within 24 hours.  If you had a scopolamine patch placed behind your ear for the management of post- operative nausea and/or vomiting:  1. The medication in the patch is effective for 72 hours, after which it should be removed.  Wrap patch in a tissue and discard in the trash. Wash hands thoroughly with soap and water. 2. You may remove the patch earlier than 72 hours if you experience unpleasant side effects which may include dry mouth, dizziness or visual disturbances. 3. Avoid touching the patch. Wash your hands with soap and water after contact with the patch.

## 2018-03-20 NOTE — Anesthesia Postprocedure Evaluation (Signed)
Anesthesia Post Note  Patient: Artemisa Sladek  Procedure(s) Performed: RE-EXCISION OF BREAST LUMPECTOMY (Left Breast)     Patient location during evaluation: PACU Anesthesia Type: General Level of consciousness: awake and alert Pain management: pain level controlled Vital Signs Assessment: post-procedure vital signs reviewed and stable Respiratory status: spontaneous breathing, nonlabored ventilation, respiratory function stable and patient connected to nasal cannula oxygen Cardiovascular status: blood pressure returned to baseline and stable Postop Assessment: no apparent nausea or vomiting Anesthetic complications: no    Last Vitals:  Vitals:   03/20/18 1045 03/20/18 1109  BP: 125/69 134/71  Pulse: 82 80  Resp: 14 18  Temp:  36.4 C  SpO2: 95% 97%    Last Pain:  Vitals:   03/20/18 1109  TempSrc:   PainSc: 0-No pain                 Nakenya Theall S

## 2018-03-20 NOTE — Op Note (Signed)
Re-excisional Left Breast Lumpectomy   Indications: This patient presents with history of positive anterior and medial margins after partial mastectomy for left breast cancer   Pre-operative Diagnosis: left breast cancer   Post-operative Diagnosis: Left breast cancer   Surgeon: [name]   Assistants: n/a   Anesthesia: General anesthesia and Local   ASA Class: 2   Procedure Details  The patient was seen in the Holding Room. The risks, benefits, complications, treatment options, and expected outcomes were discussed with the patient. The possibilities of reaction to medication, pulmonary aspiration, bleeding, infection, the need for additional procedures, failure to diagnose a condition, and creating a complication requiring transfusion or operation were discussed with the patient. The patient concurred with the proposed plan, giving informed consent. The site of surgery properly noted/marked. The patient was taken to Operating Room # 8, identified, and the procedure verified as re-excision of left breast cancer.  After induction of anesthesia, the left breast and chest were prepped and draped in standard fashion.  The lumpectomy was performed by reopening the prior incision. Seroma was aspirated. The mastopexy sutures were removed. Additional margins were taken at the medial and anterior borders of the partial mastectomy cavity. An additional large clip was placed at the new medial margin.     Hemostasis was achieved with cautery. The wound was irrigated and closed with a 3-0 Vicryl deep dermal interrupted and a 4-0 Monocryl subcuticular closure in layers.  Sterile dressings were applied. At the end of the operation, all sponge, instrument, and needle counts were correct.   Findings:  grossly clear surgical margins.  Anterior margin is skin.    Estimated Blood Loss: Minimal   Drains: none   Specimens: additional medial and anterior margins.   Complications: None; patient tolerated  the procedure well.   Disposition: PACU - hemodynamically stable.   Condition: stable

## 2018-03-20 NOTE — Anesthesia Procedure Notes (Signed)
Procedure Name: LMA Insertion Date/Time: 03/20/2018 9:42 AM Performed by: Willa Frater, CRNA Pre-anesthesia Checklist: Patient identified, Emergency Drugs available, Suction available and Patient being monitored Patient Re-evaluated:Patient Re-evaluated prior to induction Oxygen Delivery Method: Circle system utilized Preoxygenation: Pre-oxygenation with 100% oxygen Induction Type: IV induction Ventilation: Mask ventilation without difficulty LMA: LMA inserted LMA Size: 3.0 Number of attempts: 1 Airway Equipment and Method: Bite block Placement Confirmation: positive ETCO2 Tube secured with: Tape Dental Injury: Teeth and Oropharynx as per pre-operative assessment

## 2018-03-21 ENCOUNTER — Encounter (HOSPITAL_BASED_OUTPATIENT_CLINIC_OR_DEPARTMENT_OTHER): Payer: Self-pay | Admitting: General Surgery

## 2018-03-21 ENCOUNTER — Encounter: Payer: Self-pay | Admitting: Physical Therapy

## 2018-03-24 NOTE — Progress Notes (Signed)
Please let patient know margins are negative!

## 2018-03-26 ENCOUNTER — Encounter: Payer: Self-pay | Admitting: Hematology

## 2018-03-27 NOTE — Progress Notes (Signed)
Location of Breast Cancer: Left Breast  Histology per Pathology Report:  01/16/18 Diagnosis 1. Breast, left, needle core biopsy, 7:30 o'clock (ribbon clip) - FIBROCYSTIC CHANGES WITH SCLEROSING ADENOSIS AND CALCIFICATIONS. - FIBROADENOMATOID CHANGE. - NO MALIGNANCY IDENTIFIED. 2. Breast, left, needle core biopsy, 7 o'clock position (coil clip) - INVASIVE MAMMARY CARCINOMA, MSBR GRADE I/II. - SEE MICROSCOPIC DESCRIPTION  Receptor Status: ER(100%), PR (50%), Her2-neu (NEG), Ki-(20%)  02/21/18 Diagnosis 1. Breast, lumpectomy, Left - INVASIVE DUCTAL CARCINOMA, GRADE I, 1.6 CM. - DUCTAL CARCINOMA IN SITU, INTERMEDIATE NUCLEAR GRADE. - ANTERIOR AND MEDIAL RESECTION MARGINS ARE POSITIVE FOR CARCINOMA. - NEGATIVE FOR LYMPHOVASCULAR OR PERINEURAL INVASION. - BACKGROUND BREAST TISSUE WITH FIBROCYSTIC CHANGE, INCLUDING SCLEROSING ADENOSIS. - BIOPSY SITE CHANGES. - SEE ONCOLOGY TABLE. 2. Lymph node, sentinel, biopsy, Left Axillary #1 - METASTATIC BREAST CARCINOMA TO A LYMPH NODE, 1.0 CM IN GREATEST DIMENSION, WITH EXTRANODAL EXTENSION (1/1). 3. Lymph node, sentinel, biopsy, Left Axillary #2 - LYMPH NODE, NEGATIVE FOR CARCINOMA (0/1).  03/20/18 Diagnosis 1. Breast, excision, Left new anterior margin - FIBROCYSTIC CHANGES WITH ADENOSIS AND CALCIFICATIONS. - HEALING BIOPSY SITE. - THERE IS NO EVIDENCE OF MALIGNANCY. 2. Breast, excision, Left new medial margin - FIBROCYSTIC CHANGES WITH ADENOSIS AND CALCIFICATIONS. - HEALING BIOPSY SITE. - THERE IS NO EVIDENCE OF MALIGNANCY.  Did patient present with symptoms or was this found on screening mammography?: She presented with a palpable mass to her left breast, which turned out to be a cyst, but when mammogram done and lesion was found as well.   Past/Anticipated interventions by surgeon, if any: 02/21/18 Dr. Barry Dienes Left Breast Lumpectomy with Sentinel Node Biopsy Procedure Note  03/20/18 Dr. Barry Dienes Re-excisional Left Breast Lumpectomy     Past/Anticipated interventions by medical oncology, if any:  Dr. Burr Medico 01/23/18 [facility].   Lymphedema issues, if any: She denies. She has good arm mobility.   Pain issues, if any: She denies  SAFETY ISSUES:  Prior radiation? No  Pacemaker/ICD? No  Possible current pregnancy? No  Is the patient on methotrexate? No  Current Complaints / other details:   She does have some bleeding from her incision site. Her surgeon is aware.   BP 129/73 (BP Location: Right Arm, Patient Position: Sitting)   Pulse 77   Temp 98.5 F (36.9 C) (Oral)   Wt 126 lb (57.2 kg)   SpO2 99%   BMI 22.68 kg/m     Keren Alverio, Stephani Police, RN 03/27/2018,3:52 PM

## 2018-04-04 ENCOUNTER — Ambulatory Visit: Payer: 59 | Attending: General Surgery | Admitting: Physical Therapy

## 2018-04-04 ENCOUNTER — Encounter: Payer: Self-pay | Admitting: Physical Therapy

## 2018-04-04 ENCOUNTER — Other Ambulatory Visit: Payer: Self-pay

## 2018-04-04 DIAGNOSIS — R293 Abnormal posture: Secondary | ICD-10-CM | POA: Insufficient documentation

## 2018-04-04 DIAGNOSIS — Z170 Estrogen receptor positive status [ER+]: Secondary | ICD-10-CM | POA: Insufficient documentation

## 2018-04-04 DIAGNOSIS — C50312 Malignant neoplasm of lower-inner quadrant of left female breast: Secondary | ICD-10-CM | POA: Insufficient documentation

## 2018-04-04 DIAGNOSIS — Z483 Aftercare following surgery for neoplasm: Secondary | ICD-10-CM

## 2018-04-04 NOTE — Therapy (Signed)
[place], [place], [address] Phone: [phone]   Fax:  [phone]  Physical Therapy Treatment  Patient Details  Name: Martha [name] MRN: [id number] Date of Birth: Mar 03, 1961 Referring Provider: Dr. [name]   Encounter Date: 04/04/2018  PT End of Session - 04/04/18 0942    Visit Number  2    Number of Visits  2    PT Start Time  0845    PT Stop Time  0935    PT Time Calculation (min)  50 min    Activity Tolerance  Patient tolerated treatment well    Behavior During Therapy  [facility] for tasks assessed/performed       Past Medical History:  Diagnosis Date  . Dysrhythmia    SVT, s/p ablation ~ 2012 in at [facility]  . PONV (postoperative nausea and vomiting)   . Thyroid disease     Past Surgical History:  Procedure Laterality Date  . APPENDECTOMY    . BREAST BIOPSY Right 2014   fibroadenoma  . BREAST EXCISIONAL BIOPSY Left 1990   benign  . BREAST LUMPECTOMY WITH AXILLARY LYMPH NODE BIOPSY Left 02/21/2018   Procedure: LEFT BREAST LUMPECTOMY WITH AXILLARY LYMPH NODE BIOPSY;  Surgeon: [name], MD;  Location: [place];  Service: General;  Laterality: Left;  . BREAST SURGERY    . EYE SURGERY    . RE-EXCISION OF BREAST LUMPECTOMY Left 03/20/2018   Procedure: RE-EXCISION OF BREAST LUMPECTOMY;  Surgeon: [name], MD;  Location: [place];  Service: General;  Laterality: Left;    There were no vitals filed for this visit.  Subjective Assessment - 04/04/18 0852    Subjective  Patient underwent a left lumpectomy and sentinel node biopsy (1/2 nodes positive) on 02/18/18 with a re-excision on 03/23/18 to obtain clear margins. Her Mammaprint result was low so no need for chemo but will begin radiation next week.    Pertinent History  Patient was diagnosed on 01/15/18 with left grade II invasive ductal carcinoma breast cancer. It measured 1.6 cm and is located in the  lower inner quadrant. It is ER/PR positive and HER2 negative with a Ki67 of 20%. Patient underwent a left lumpectomy and sentinel node biopsy (1/2 nodes positive) on 02/18/18 with a re-excision on 03/23/18 to obtain clear margins. She smokes a pack per day and drinks 5 beers/day.    Patient Stated Goals  Make sure my arm is ok    Currently in Pain?  No/denies         [facility] PT Assessment - 04/04/18 0001      Assessment   Medical Diagnosis  s/p left lumpectomy and SLNB    Referring Provider  Dr. [name]    Onset Date/Surgical Date  02/18/18    Hand Dominance  Right    Prior Therapy  Baseline assessment      Precautions   Precautions  Other (comment)    Precaution Comments  Right arm lymphedema risk      Restrictions   Weight Bearing Restrictions  No      Balance Screen   Has the patient fallen in the past 6 months  No    Has the patient had a decrease in activity level because of a fear of falling?   No    Is the patient reluctant to leave their home because of a fear of falling?   No      Home Environment  Living Environment  Private residence    Living Arrangements  Spouse/significant other;Parent    Available Help at Discharge  Family      Prior Function   Level of [place]  Unemployed    Leisure  She does not exercise      Cognition   Overall Cognitive Status  Within Functional Limits for tasks assessed      Observation/Other Assessments   Observations  Incision sites appear to be well healed. There is some edema present in her breast from her recent surgery but patient reports it is improving.      Posture/Postural Control   Posture/Postural Control  Postural limitations    Postural Limitations  Rounded Shoulders;Forward head      ROM / Strength   AROM / PROM / Strength  AROM;Strength      AROM   AROM Assessment Site  Shoulder    Right/Left Shoulder  Left    Left Shoulder Extension  56 Degrees    Left Shoulder Flexion  151  Degrees    Left Shoulder ABduction  162 Degrees    Left Shoulder Internal Rotation  68 Degrees    Left Shoulder External Rotation  87 Degrees        LYMPHEDEMA/ONCOLOGY QUESTIONNAIRE - 04/04/18 0907      Type   Cancer Type  Left breast cancer      Surgeries   Lumpectomy Date  02/18/18    Sentinel Lymph Node Biopsy Date  02/18/18    Other Surgery Date  03/23/18    Number Lymph Nodes Removed  2      Treatment   Active Chemotherapy Treatment  No    Past Chemotherapy Treatment  No    Active Radiation Treatment  No    Past Radiation Treatment  No    Current Hormone Treatment  No    Past Hormone Therapy  No      What other symptoms do you have   Are you Having Heaviness or Tightness  No    Are you having Pain  No    Are you having pitting edema  No    Is it Hard or Difficult finding clothes that fit  No    Do you have infections  No    Is there Decreased scar mobility  No    [place] Sign  No      Lymphedema Assessments   Lymphedema Assessments  Upper extremities      Right Upper Extremity Lymphedema   10 cm Proximal to Olecranon Process  23.3 cm    Olecranon Process  21.3 cm    10 cm Proximal to Ulnar Styloid Process  19.3 cm    Just Proximal to Ulnar Styloid Process  14.2 cm    Across Hand at [employer]  18.2 cm    At [place] of 2nd Digit  6.1 cm      Left Upper Extremity Lymphedema   10 cm Proximal to Olecranon Process  23.6 cm    Olecranon Process  22.2 cm    10 cm Proximal to Ulnar Styloid Process  18.9 cm    Just Proximal to Ulnar Styloid Process  14.1 cm    Across Hand at [employer]  17.5 cm    At [place] of 2nd Digit  6.1 cm        Quick Dash - 04/04/18 0001    Open a tight or new jar  No  difficulty    Do heavy household chores (wash walls, wash floors)  No difficulty    Carry a shopping bag or briefcase  No difficulty    Wash your back  No difficulty    Use a knife to cut food  No difficulty    Recreational activities in which you take some force  or impact through your arm, shoulder, or hand (golf, hammering, tennis)  No difficulty    During the past week, to what extent has your arm, shoulder or hand problem interfered with your normal social activities with family, friends, neighbors, or groups?  Not at all    During the past week, to what extent has your arm, shoulder or hand problem limited your work or other regular daily activities  Not at all    Arm, shoulder, or hand pain.  None    Tingling (pins and needles) in your arm, shoulder, or hand  None    Difficulty Sleeping  No difficulty    DASH Score  0 %                     PT Education - 04/04/18 0941    Education provided  Yes    Education Details  Lymphedema risk reduction; education on wearing a sports bra to reduce left breast edema    Person(s) Educated  Patient    Methods  Explanation    Comprehension  Verbalized understanding          PT Long Term Goals - 04/04/18 0945      PT LONG TERM GOAL #1   Title  Patient will demonstrate she has returned to baseline post operatively related to shoulder ROM and function.    Time  8    Period  Weeks    Status  Achieved            Plan - 04/04/18 0037    Clinical Impression Statement  Patient is doing very well s/p left lumpectomy and SLNB. She had 1 positive axillary lymph node so she will have left breast and axillary radiation but no need for chemotherapy. Her shoulder ROM and function has returned to baseline and she is doing very well. She talked with PT about recurrance and her concern of that. We discussed the importance of smoking cessation and also discussed her alcohol consumption of 6 drinks per day. PT questioned if she felt she wanted to stop and if she had a problem she needed help with. She stated she felt like she could quit on her own and plans to try to stop smoking and drinking and get apart time job to stay busy. She was encouraged to also discuss this with her doctor to see if they  could help. She verbalized understanding of that. There is no further need for PT except to attend the After Breast Cancer class for lymphedema education.    PT Treatment/Interventions  ADLs/Self Care Home Management;Therapeutic exercise;Patient/family education    PT Next Visit Plan  D/C    Consulted and Agree with Plan of Care  Patient       Patient will benefit from skilled therapeutic intervention in order to improve the following deficits and impairments:  Decreased knowledge of precautions, Impaired UE functional use, Decreased range of motion, Postural dysfunction, Pain  Visit Diagnosis: Malignant neoplasm of lower-inner quadrant of left breast in female, estrogen receptor positive ([name])  Abnormal posture  Aftercare following surgery for neoplasm     Problem List  Patient Active Problem List   Diagnosis Date Noted  . Malignant neoplasm of lower-inner quadrant of left breast in female, estrogen receptor positive ([place]) 01/22/2018  . Hypothyroidism 04/04/2017  . Nicotine dependence with current use 04/04/2017  . Excessive drinking of alcohol 04/04/2017  . Menopausal symptom 04/04/2017    PHYSICAL THERAPY DISCHARGE SUMMARY  Visits from Start of Care: 2  Current functional level related to goals / functional outcomes: Goals met.   Remaining deficits: none   Education / Equipment: Lymphedema risk reduction and post op shoulder ROM HEP Plan: Patient agrees to discharge.  Patient goals were met. Patient is being discharged due to meeting the stated rehab goals.  ?????         Annia Friendly, [place] 04/04/18 9:47 AM   [place], [place], [address] Phone: [phone]   Fax:  [phone]  Name: Yecenia Dalgleish MRN: [id number] Date of Birth: 01/29/1961

## 2018-04-09 ENCOUNTER — Encounter: Payer: Self-pay | Admitting: Radiation Oncology

## 2018-04-09 ENCOUNTER — Ambulatory Visit
Admission: RE | Admit: 2018-04-09 | Discharge: 2018-04-09 | Disposition: A | Discharge status: Home / Self Care | Payer: 59 | Source: Ambulatory Visit | Attending: Radiation Oncology | Admitting: Radiation Oncology

## 2018-04-09 ENCOUNTER — Other Ambulatory Visit: Payer: Self-pay

## 2018-04-09 VITALS — BP 129/73 | HR 77 | Temp 98.5°F | Wt 126.0 lb

## 2018-04-09 DIAGNOSIS — Z79899 Other long term (current) drug therapy: Secondary | ICD-10-CM | POA: Insufficient documentation

## 2018-04-09 DIAGNOSIS — Z170 Estrogen receptor positive status [ER+]: Secondary | ICD-10-CM | POA: Diagnosis not present

## 2018-04-09 DIAGNOSIS — C50312 Malignant neoplasm of lower-inner quadrant of left female breast: Secondary | ICD-10-CM | POA: Insufficient documentation

## 2018-04-09 DIAGNOSIS — C773 Secondary and unspecified malignant neoplasm of axilla and upper limb lymph nodes: Secondary | ICD-10-CM | POA: Diagnosis not present

## 2018-04-09 NOTE — Progress Notes (Signed)
Radiation Oncology         ([phone]) [phone] ________________________________  Name: Martha [name] MRN: [id number]  Date: 04/09/2018  DOB: 12/19/1960  Follow-Up Visit Note  Outpatient  CC: [name], MD  [name], MD  Diagnosis:      ICD-10-CM   1. Malignant neoplasm of lower-inner quadrant of left breast in female, estrogen receptor positive ([place]) C50.312    Z17.0    Cancer Staging Malignant neoplasm of lower-inner quadrant of left breast in female, estrogen receptor positive ([place]) Staging form: Breast, AJCC 8th Edition - Clinical stage from 01/16/2018: Stage IA (cT1c, cN0, cM0, G2, ER+, PR+, HER2-) - Signed by Truitt Merle, MD on 01/23/2018 - Pathologic: Stage IA (pT1c, pN1, cM0, G1, ER+, PR+, HER2-) - Signed by Eppie Gibson, MD on 04/09/2018  CHIEF COMPLAINT: Here to discuss management of left breast cancer  Narrative:  The patient returns today following completed left breast lumpectomy. She is accompanied by her mother. She was seen in breast clinic on 01/23/18. She has been followed by her surgeon, Dr. Barry Dienes.  Breast/nodal surgery on date of 02/21/18 revealed: tumor size of 1.6 cm; histology of invasive ductal carcinoma; margin status to invasive disease of involved anterior and medial margins; margin status to in situ disease of greater than 10 mm; nodal status of 1.0 cm metastatic carcinoma to left axillary sentinel (1 of 2 nodes positive with ECE);  ER status: 100%; PR status 50%, Her2 status negative; Grade I, with intermediate grade DCIS.  Post-biopsy re-excision on 03/20/18 revealed negative margins.  The patient's mammaprint score was low-risk.  Symptomatically, the patient reports: issues with bleeding over the weekend, which she has covered with a paper towel as per Dr. Marlowe Aschoff direction. She notes the bleeding has slowed.  She is not working currently.        ALLERGIES:  has No Known Allergies.  Meds: Current Outpatient Medications  Medication Sig Dispense  Refill  . levothyroxine (SYNTHROID, LEVOTHROID) 75 MCG tablet Take 1 tablet (75 mcg total) by mouth daily before breakfast. 90 tablet 0  . OVER THE COUNTER MEDICATION Take 1 tablet by mouth daily. Protandim Supplement    . oxyCODONE (OXY IR/ROXICODONE) 5 MG immediate release tablet Take 1 tablet (5 mg total) by mouth every 6 (six) hours as needed for severe pain. (Patient not taking: Reported on 04/04/2018) 20 tablet 0  . PARoxetine (PAXIL) 20 MG tablet TAKE 1 TABLET DAILY AS DIRECTED. (Patient taking differently: Take 20 mg by mouth in the evening) 90 tablet 1  . Probiotic Product (PROBIOTIC PO) Take 1 capsule by mouth daily.     No current facility-administered medications for this encounter.     Physical Findings:  weight is 126 lb (57.2 kg). Her oral temperature is 98.5 F (36.9 C). Her blood pressure is 129/73 and her pulse is 77. Her oxygen saturation is 99%. .     General: Alert and oriented, in no acute distress Heart: Regular in rate and rhythm with no murmurs, rubs, or gallops. Chest: Clear to auscultation bilaterally, with no rhonchi, wheezes, or rales. Lymphatics: see Neck Exam Musculoskeletal: symmetric strength and muscle tone throughout. She has good range of motion.  Neurologic: No obvious focalities. Speech is fluent.  Psychiatric: Judgment and insight are intact. Affect is appropriate. Breast exam reveals scar at the inner and lower rim of the left areola with some mild bleeding, and she has a moderate seroma in the left breast.  Lab Findings: Lab Results  Component Value  Date   WBC 5.4 02/14/2018   HGB 14.6 02/14/2018   HCT 45.0 02/14/2018   MCV 95.9 02/14/2018   PLT 190 02/14/2018      Radiographic Findings: No results found.  Impression/Plan: We discussed adjuvant radiotherapy today.  I recommend radiotherapy to the left breast and regional nodes over 6 weeks in order to reduce risk of locoregional recurrence by 2/3.  The risks, benefits and side effects of  this treatment were discussed in detail.  She understands that radiotherapy is associated with skin irritation and fatigue in the acute setting. Late effects can include cosmetic changes and rare injury to internal organs.   She is enthusiastic about proceeding with treatment. A consent form has been signed and placed in her chart. We will wait at least another week to begin planning due to her recent re-excision.  A total of 5 medically necessary complex treatment devices will be fabricated and supervised by me: 4 fields with MLCs for custom blocks to protect heart, and lungs;  and, a Vac-lok. MORE COMPLEX DEVICES MAY BE MADE IN DOSIMETRY FOR FIELD IN FIELD BEAMS FOR DOSE HOMOGENEITY.  I have requested : 3D Simulation which is medically necessary to give adequate dose to at risk tissues while sparing lungs and heart.  I have requested a DVH of the following structures: lungs, heart, left lumpectomy cavity.    The patient will receive 50 Gy in 25 fractions to the L breast, L SCV, L axillary nodes with 4 fields.  This will be followed by a boost.  She will follow-up with medical oncology following completion of radiation to begin anti-estrogen therapy.  The patient continues to use tobacco. The patient was counseled to stop using tobacco and was offered pharmacotherapy and further counseling to help with this. The patient declined pharmacotherapy and further counseling at this time but may try 1800QUITNOW. She reports that her mother, who also smokes, moved in with her recently. I recommend they set a quit date together. The patient states she will quit on 04/12/18, upon leaving for a trip to the mountains. Her mother will quit by the time the patient returns from vacation.  I spent 20  minutes face to face with the patient and more than 50% of that time was spent in counseling and/or coordination of care. _____________________________________   Eppie Gibson, MD  This document serves as a record of  services personally performed by Eppie Gibson, MD. It was created on his behalf by Wilburn Mylar, a trained medical scribe. The creation of this record is based on the scribe's personal observations and the provider's statements to them. This document has been checked and approved by the attending provider.

## 2018-04-15 ENCOUNTER — Ambulatory Visit
Admission: RE | Admit: 2018-04-15 | Discharge: 2018-04-15 | Disposition: A | Discharge status: Home / Self Care | Payer: 59 | Source: Ambulatory Visit | Attending: Radiation Oncology | Admitting: Radiation Oncology

## 2018-04-15 DIAGNOSIS — Z170 Estrogen receptor positive status [ER+]: Secondary | ICD-10-CM | POA: Insufficient documentation

## 2018-04-15 DIAGNOSIS — C50312 Malignant neoplasm of lower-inner quadrant of left female breast: Secondary | ICD-10-CM | POA: Diagnosis not present

## 2018-04-15 DIAGNOSIS — Z510 Encounter for antineoplastic radiation therapy: Secondary | ICD-10-CM | POA: Diagnosis not present

## 2018-04-15 NOTE — Progress Notes (Signed)
Radiation Oncology         [phone]) [phone] ________________________________  Name: Martha [name] MRN: [id number]  Date: 04/15/2018  DOB: [birth year]  SIMULATION AND TREATMENT PLANNING NOTE / Special treatment procedure  Outpatient  DIAGNOSIS:     ICD-10-CM   1. Malignant neoplasm of lower-inner quadrant of left breast in female, estrogen receptor positive ([place]) C50.312    Z17.0     NARRATIVE:  The patient was brought to the [facility].  Identity was confirmed.  All relevant records and images related to the planned course of therapy were reviewed.  The patient freely provided informed written consent to proceed with treatment after reviewing the details related to the planned course of therapy. The consent form was witnessed and verified by the simulation staff.    Then, the patient was set-up in a stable reproducible supine position for radiation therapy with her ipsilateral arm over her head, and her upper body secured in a custom-made Vac-lok device.  CT images were obtained.  Surface markings were placed.  The CT images were loaded into the planning software.    Special treatment procedure:  Special treatment procedure was performed today due to the extra time and effort required by myself to plan and prepare this patient for deep inspiration breath hold technique.  I have determined cardiac sparing to be of benefit to this patient to prevent long term cardiac damage due to radiation of the heart.  Bellows were placed on the patient's abdomen. To facilitate cardiac sparing, the patient was coached by the radiation therapists on breath hold techniques and breathing practice was performed. Practice waveforms were obtained. The patient was then scanned while maintaining breath hold in the treatment position.  This image was then transferred over to the imaging specialist. The imaging specialist then created a fusion of the free breathing and breath hold scans using the chest  wall as the stable structure. I personally reviewed the fusion in axial, coronal and sagittal image planes.  Excellent cardiac sparing was obtained.  I felt the patient is an appropriate candidate for breath hold and the patient will be treated as such.  The image fusion was then reviewed with the patient to reinforce the necessity of reproducible breath hold.  TREATMENT PLANNING NOTE: Treatment planning then occurred.  The radiation prescription was entered and confirmed.     A total of 5 medically necessary complex treatment devices were fabricated and supervised by me: 4 fields with MLCs for custom blocks to protect heart, and lungs;  and, a Vac-lok. MORE COMPLEX DEVICES MAY BE MADE IN DOSIMETRY FOR FIELD IN FIELD BEAMS FOR DOSE HOMOGENEITY.  I have requested : 3D Simulation which is medically necessary to give adequate dose to at risk tissues while sparing lungs and heart.  I have requested a DVH of the following structures: lungs, heart, lumpectomy cavity.    The patient will receive 50 Gy in 25 fractions to the left breast and SCV, axilla nodes with 4 fields. This will  be followed by a boost.  Optical Surface Tracking Plan:  Since intensity modulated radiotherapy (IMRT) and 3D conformal radiation treatment methods are predicated on accurate and precise positioning for treatment, intrafraction motion monitoring is medically necessary to ensure accurate and safe treatment delivery. The ability to quantify intrafraction motion without excessive ionizing radiation dose can only be performed with optical surface tracking. Accordingly, surface imaging offers the opportunity to obtain 3D measurements of patient position throughout IMRT and 3D treatments without excessive  radiation exposure. I am ordering optical surface tracking for this patient's upcoming course of radiotherapy.  ________________________________   Reference:  Ursula Alert, J, et al. Surface imaging-based analysis of  intrafraction motion for breast radiotherapy patients.Journal of [place], n. 6, nov. 2014. ISSN [id number].  Available at: <http://www.jacmp.org/index.php/jacmp/article/view/4957>.    -----------------------------------  Eppie Gibson, MD

## 2018-04-16 DIAGNOSIS — C50312 Malignant neoplasm of lower-inner quadrant of left female breast: Secondary | ICD-10-CM | POA: Diagnosis not present

## 2018-04-16 DIAGNOSIS — Z510 Encounter for antineoplastic radiation therapy: Secondary | ICD-10-CM | POA: Diagnosis not present

## 2018-04-18 ENCOUNTER — Telehealth: Payer: Self-pay | Admitting: Hematology

## 2018-04-18 NOTE — Telephone Encounter (Signed)
Mailed pt calendar of upcoming sept appts per 8/19 sch message

## 2018-04-22 ENCOUNTER — Ambulatory Visit
Admission: RE | Admit: 2018-04-22 | Discharge: 2018-04-22 | Disposition: A | Discharge status: Home / Self Care | Payer: 59 | Source: Ambulatory Visit | Attending: Radiation Oncology | Admitting: Radiation Oncology

## 2018-04-22 DIAGNOSIS — C50312 Malignant neoplasm of lower-inner quadrant of left female breast: Secondary | ICD-10-CM | POA: Diagnosis not present

## 2018-04-22 DIAGNOSIS — Z510 Encounter for antineoplastic radiation therapy: Secondary | ICD-10-CM | POA: Diagnosis not present

## 2018-04-22 DIAGNOSIS — Z170 Estrogen receptor positive status [ER+]: Principal | ICD-10-CM

## 2018-04-22 MED ORDER — ALRA NON-METALLIC DEODORANT (RAD-ONC)
1.0000 "application " | Freq: Once | TOPICAL | Status: AC
  Administered 2018-04-22: 1 via TOPICAL

## 2018-04-22 MED ORDER — RADIAPLEXRX EX GEL
Freq: Once | CUTANEOUS | Status: AC
  Administered 2018-04-22: 17:00:00 via TOPICAL

## 2018-04-22 NOTE — Progress Notes (Signed)

## 2018-04-23 ENCOUNTER — Ambulatory Visit
Admission: RE | Admit: 2018-04-23 | Discharge: 2018-04-23 | Disposition: A | Discharge status: Home / Self Care | Payer: 59 | Source: Ambulatory Visit | Attending: Radiation Oncology | Admitting: Radiation Oncology

## 2018-04-23 ENCOUNTER — Other Ambulatory Visit: Payer: Self-pay | Admitting: Family Medicine

## 2018-04-23 DIAGNOSIS — C50312 Malignant neoplasm of lower-inner quadrant of left female breast: Secondary | ICD-10-CM | POA: Diagnosis not present

## 2018-04-23 DIAGNOSIS — Z510 Encounter for antineoplastic radiation therapy: Secondary | ICD-10-CM | POA: Diagnosis not present

## 2018-04-23 MED ORDER — LEVOTHYROXINE SODIUM 75 MCG PO TABS: 75.0000 ug | ORAL_TABLET | Freq: Every day | ORAL | 0 refills | Status: DC

## 2018-04-23 NOTE — Telephone Encounter (Signed)
Ok with me. Please place any necessary orders. 

## 2018-04-24 ENCOUNTER — Ambulatory Visit
Admission: RE | Admit: 2018-04-24 | Discharge: 2018-04-24 | Disposition: A | Discharge status: Home / Self Care | Payer: 59 | Source: Ambulatory Visit | Attending: Radiation Oncology | Admitting: Radiation Oncology

## 2018-04-24 DIAGNOSIS — C50312 Malignant neoplasm of lower-inner quadrant of left female breast: Secondary | ICD-10-CM | POA: Diagnosis not present

## 2018-04-24 DIAGNOSIS — Z510 Encounter for antineoplastic radiation therapy: Secondary | ICD-10-CM | POA: Diagnosis not present

## 2018-04-25 ENCOUNTER — Ambulatory Visit
Admission: RE | Admit: 2018-04-25 | Discharge: 2018-04-25 | Disposition: A | Discharge status: Home / Self Care | Payer: 59 | Source: Ambulatory Visit | Attending: Radiation Oncology | Admitting: Radiation Oncology

## 2018-04-25 DIAGNOSIS — C50312 Malignant neoplasm of lower-inner quadrant of left female breast: Secondary | ICD-10-CM | POA: Diagnosis not present

## 2018-04-25 DIAGNOSIS — Z510 Encounter for antineoplastic radiation therapy: Secondary | ICD-10-CM | POA: Diagnosis not present

## 2018-04-26 ENCOUNTER — Ambulatory Visit
Admission: RE | Admit: 2018-04-26 | Discharge: 2018-04-26 | Disposition: A | Discharge status: Home / Self Care | Payer: 59 | Source: Ambulatory Visit | Attending: Radiation Oncology | Admitting: Radiation Oncology

## 2018-04-26 DIAGNOSIS — C50312 Malignant neoplasm of lower-inner quadrant of left female breast: Secondary | ICD-10-CM | POA: Diagnosis not present

## 2018-04-26 DIAGNOSIS — Z510 Encounter for antineoplastic radiation therapy: Secondary | ICD-10-CM | POA: Diagnosis not present

## 2018-04-28 ENCOUNTER — Encounter (HOSPITAL_COMMUNITY): Payer: Self-pay | Admitting: Emergency Medicine

## 2018-04-28 ENCOUNTER — Ambulatory Visit (HOSPITAL_COMMUNITY)
Admission: EM | Admit: 2018-04-28 | Discharge: 2018-04-28 | Disposition: A | Discharge status: Home / Self Care | Payer: 59 | Attending: Family Medicine | Admitting: Family Medicine

## 2018-04-28 DIAGNOSIS — L089 Local infection of the skin and subcutaneous tissue, unspecified: Secondary | ICD-10-CM | POA: Diagnosis present

## 2018-04-28 DIAGNOSIS — L0889 Other specified local infections of the skin and subcutaneous tissue: Secondary | ICD-10-CM | POA: Diagnosis not present

## 2018-04-28 DIAGNOSIS — N6489 Other specified disorders of breast: Secondary | ICD-10-CM | POA: Diagnosis not present

## 2018-04-28 DIAGNOSIS — Z79890 Hormone replacement therapy (postmenopausal): Secondary | ICD-10-CM | POA: Insufficient documentation

## 2018-04-28 DIAGNOSIS — F17210 Nicotine dependence, cigarettes, uncomplicated: Secondary | ICD-10-CM | POA: Diagnosis not present

## 2018-04-28 DIAGNOSIS — T148XXA Other injury of unspecified body region, initial encounter: Secondary | ICD-10-CM

## 2018-04-28 DIAGNOSIS — T8149XA Infection following a procedure, other surgical site, initial encounter: Secondary | ICD-10-CM | POA: Diagnosis not present

## 2018-04-28 DIAGNOSIS — Z853 Personal history of malignant neoplasm of breast: Secondary | ICD-10-CM

## 2018-04-28 DIAGNOSIS — Z5189 Encounter for other specified aftercare: Secondary | ICD-10-CM

## 2018-04-28 DIAGNOSIS — Z8249 Family history of ischemic heart disease and other diseases of the circulatory system: Secondary | ICD-10-CM | POA: Diagnosis not present

## 2018-04-28 DIAGNOSIS — Z170 Estrogen receptor positive status [ER+]: Secondary | ICD-10-CM | POA: Diagnosis not present

## 2018-04-28 DIAGNOSIS — C50312 Malignant neoplasm of lower-inner quadrant of left female breast: Secondary | ICD-10-CM | POA: Diagnosis not present

## 2018-04-28 DIAGNOSIS — E039 Hypothyroidism, unspecified: Secondary | ICD-10-CM | POA: Diagnosis not present

## 2018-04-28 DIAGNOSIS — Z79899 Other long term (current) drug therapy: Secondary | ICD-10-CM | POA: Insufficient documentation

## 2018-04-28 DIAGNOSIS — T8130XA Disruption of wound, unspecified, initial encounter: Secondary | ICD-10-CM

## 2018-04-28 DIAGNOSIS — Y838 Other surgical procedures as the cause of abnormal reaction of the patient, or of later complication, without mention of misadventure at the time of the procedure: Secondary | ICD-10-CM | POA: Diagnosis not present

## 2018-04-28 MED ORDER — FLUCONAZOLE 150 MG PO TABS: 150.0000 mg | ORAL_TABLET | Freq: Every day | ORAL | 0 refills | Status: DC

## 2018-04-28 MED ORDER — LEVOFLOXACIN 750 MG PO TABS: 750.0000 mg | ORAL_TABLET | Freq: Every day | ORAL | 0 refills | Status: DC

## 2018-04-28 NOTE — Discharge Instructions (Addendum)
Warm compresses to area Take antibiotic once a day Use Diflucan if needed for yeast infection Follow-up with your surgeon We did lab testing during this visit. (culture)  If there are any abnormal findings that require change in medicine or indicate a positive result, you will be notified.  If all of your tests are normal, you will not be called.

## 2018-04-28 NOTE — ED Triage Notes (Signed)
Pt states she had a lumpectomy in June and July, and ever since then the wound has been oozing and is concerned with infection. Pt states the last week the drainage has increased.

## 2018-04-28 NOTE — ED Provider Notes (Signed)
[facility]    CSN: [id number] Arrival date & time: 04/28/18  1050     History   Chief Complaint Chief Complaint  Patient presents with  . Wound Check    HPI Martha [name] is a 57 y.o. female.   HPI Recent diagnosis of breast cancer.  Had a lumpectomy performed in June.  This did not have clear margins on pathology.  She had a second procedure done in July.  It has been over a month.  She still has drainage from the wound.  The drainage has increased and has developed a foul odor.  She is here today for evaluation with concern for infection.  No fever chills.  No redness.  The area still looks mildly swollen.  She is getting radiation treatments.  She did have some bleeding from the wound earlier.  Now she just has some clear drainage.  Blood-tinged.  Past Medical History:  Diagnosis Date  . Dysrhythmia    SVT, s/p ablation ~ 2012 in at [facility]  . PONV (postoperative nausea and vomiting)   . Thyroid disease     Patient Active Problem List   Diagnosis Date Noted  . Malignant neoplasm of lower-inner quadrant of left breast in female, estrogen receptor positive ([place]) 01/22/2018  . Hypothyroidism 04/04/2017  . Nicotine dependence with current use 04/04/2017  . Excessive drinking of alcohol 04/04/2017  . Menopausal symptom 04/04/2017    Past Surgical History:  Procedure Laterality Date  . APPENDECTOMY    . BREAST BIOPSY Right 2014   fibroadenoma  . BREAST EXCISIONAL BIOPSY Left 1990   benign  . BREAST LUMPECTOMY WITH AXILLARY LYMPH NODE BIOPSY Left 02/21/2018   Procedure: LEFT BREAST LUMPECTOMY WITH AXILLARY LYMPH NODE BIOPSY;  Surgeon: [name], MD;  Location: [facility];  Service: General;  Laterality: Left;  . BREAST SURGERY    . EYE SURGERY    . RE-EXCISION OF BREAST LUMPECTOMY Left 03/20/2018   Procedure: RE-EXCISION OF BREAST LUMPECTOMY;  Surgeon: [name], MD;  Location: [facility];  Service: General;  Laterality:  Left;    OB History   None      Home Medications    Prior to Admission medications   Medication Sig Start Date End Date Taking? Authorizing Provider  fluconazole (DIFLUCAN) 150 MG tablet Take 1 tablet (150 mg total) by mouth daily. Repeat in 1 week if needed 04/28/18   Raylene Everts, MD  levofloxacin (LEVAQUIN) 750 MG tablet Take 1 tablet (750 mg total) by mouth daily. 04/28/18   Raylene Everts, MD  levothyroxine (SYNTHROID, LEVOTHROID) 75 MCG tablet Take 1 tablet (75 mcg total) by mouth daily before breakfast. 04/23/18   [name], MD  OVER THE COUNTER MEDICATION Take 1 tablet by mouth daily. Protandim Supplement    [provider]  PARoxetine (PAXIL) 20 MG tablet TAKE 1 TABLET DAILY AS DIRECTED. Patient taking differently: Take 20 mg by mouth in the evening 02/05/18   [name], MD  Probiotic Product (PROBIOTIC PO) Take 1 capsule by mouth daily.    [provider]    Family History Family History  Problem Relation Age of Onset  . Heart disease Mother   . Hyperlipidemia Sister     Social History Social History   Tobacco Use  . Smoking status: Current Every Day Smoker    Packs/day: 1.00    Years: 40.00    Pack years: 40.00  . Smokeless tobacco: Never Used  Substance Use Topics  . Alcohol use: Yes    Alcohol/week: 30.0 standard drinks    Types: 30 Cans of beer per week    Comment: 30 drinks per week   . Drug use: No     Allergies   Patient has no known allergies.   Review of Systems Review of Systems  Constitutional: Negative for chills and fever.  HENT: Negative for ear pain and sore throat.   Eyes: Negative for pain and visual disturbance.  Respiratory: Negative for cough and shortness of breath.   Cardiovascular: Negative for chest pain and palpitations.  Gastrointestinal: Negative for abdominal pain and vomiting.  Genitourinary: Negative for dysuria and hematuria.  Musculoskeletal: Negative for arthralgias and back pain.    Skin: Positive for wound. Negative for color change and rash.  Neurological: Negative for seizures and syncope.  All other systems reviewed and are negative.    Physical Exam Triage Vital Signs ED Triage Vitals  Enc Vitals Group     BP 04/28/18 1113 (!) 132/94     Pulse Rate 04/28/18 1113 86     Resp 04/28/18 1113 16     Temp 04/28/18 1113 98.6 F (37 C)     Temp src --      SpO2 04/28/18 1113 99 %   No data found.  Updated Vital Signs BP (!) 132/94   Pulse 86   Temp 98.6 F (37 C)   Resp 16   SpO2 99%      Physical Exam  Constitutional: She appears well-developed and well-nourished. No distress.  HENT:  Head: Normocephalic and atraumatic.  Mouth/Throat: Oropharynx is clear and moist.  Eyes: Pupils are equal, round, and reactive to light. Conjunctivae are normal.  Neck: Normal range of motion.  Cardiovascular: Normal rate.  Pulmonary/Chest: Effort normal. No respiratory distress.    Abdominal: Soft. She exhibits no distension.  Musculoskeletal: Normal range of motion. She exhibits no edema.  Neurological: She is alert.  Skin: Skin is warm and dry.     UC Treatments / Results  Labs (all labs ordered are listed, but only abnormal results are displayed) Labs Reviewed  AEROBIC CULTURE (SUPERFICIAL SPECIMEN)    EKG None  Radiology No results found.  Procedures Procedures (including critical care time)  Medications Ordered in UC Medications - No data to display  Initial Impression / Assessment and Plan / UC Course  I have reviewed the triage vital signs and the nursing notes.  Pertinent labs & imaging results that were available during my care of the patient were reviewed by me and considered in my medical decision making (see chart for details).     Discussed that she likely had a seroma after her surgery.  This is draining.  It explains what was bloody first and then clear.  Seromas are prone to infection.  I believe she has a bacterial  infection at this time.  A culture is placed.  Because of the malodorous discharge I am putting her on Levaquin.  I believe it well for the past bacterial coverage.  Culture report will be checked in 48 hours.  She understands and athletic change may be necessary.  She denies pain. Final Clinical Impressions(s) / UC Diagnoses   Final diagnoses:  Encounter for wound re-check  Wound infection     Discharge Instructions     Warm compresses to area Take antibiotic once a day Use Diflucan if needed for yeast infection Follow-up with your surgeon We did lab testing during  this visit. (culture)  If there are any abnormal findings that require change in medicine or indicate a positive result, you will be notified.  If all of your tests are normal, you will not be called.      ED Prescriptions    Medication Sig Dispense Auth. Provider   levofloxacin (LEVAQUIN) 750 MG tablet Take 1 tablet (750 mg total) by mouth daily. 10 tablet Raylene Everts, MD   fluconazole (DIFLUCAN) 150 MG tablet Take 1 tablet (150 mg total) by mouth daily. Repeat in 1 week if needed 2 tablet Raylene Everts, MD     Controlled Substance Prescriptions [place] Controlled Substance Registry consulted? Not Applicable   Raylene Everts, MD 04/28/18 1229

## 2018-04-30 ENCOUNTER — Ambulatory Visit
Admission: RE | Admit: 2018-04-30 | Discharge: 2018-04-30 | Disposition: A | Discharge status: Home / Self Care | Payer: 59 | Source: Ambulatory Visit | Attending: Radiation Oncology | Admitting: Radiation Oncology

## 2018-04-30 ENCOUNTER — Telehealth: Payer: Self-pay

## 2018-04-30 DIAGNOSIS — Z510 Encounter for antineoplastic radiation therapy: Secondary | ICD-10-CM | POA: Insufficient documentation

## 2018-04-30 DIAGNOSIS — Z170 Estrogen receptor positive status [ER+]: Secondary | ICD-10-CM | POA: Diagnosis not present

## 2018-04-30 DIAGNOSIS — C50312 Malignant neoplasm of lower-inner quadrant of left female breast: Secondary | ICD-10-CM | POA: Diagnosis not present

## 2018-04-30 LAB — AEROBIC CULTURE W GRAM STAIN (SUPERFICIAL SPECIMEN)

## 2018-04-30 LAB — AEROBIC CULTURE  (SUPERFICIAL SPECIMEN)

## 2018-04-30 NOTE — Telephone Encounter (Signed)
Dr. Isidore Moos requested that I call [facility] to see if they are able to see Martha [name] for evaluation of drainage to her breast incision site. Ms. Milberger is currently on an antibiotic prescribed by urgent care on 04/28/18 for this drainage and Dr. Isidore Moos would like Dr. Barry Dienes or somebody at [facility] to see her. I spoke with the triage nurse and she plans to contact Dr. Barry Dienes to help schedule an appointment. I have notified Ms. Crotwell that she should be contacted by [facility] by tomorrow. I will follow to make sure she got an appointment.

## 2018-05-01 ENCOUNTER — Ambulatory Visit
Admission: RE | Admit: 2018-05-01 | Discharge: 2018-05-01 | Disposition: A | Discharge status: Home / Self Care | Payer: 59 | Source: Ambulatory Visit | Attending: Radiation Oncology | Admitting: Radiation Oncology

## 2018-05-01 DIAGNOSIS — Z510 Encounter for antineoplastic radiation therapy: Secondary | ICD-10-CM | POA: Diagnosis not present

## 2018-05-02 ENCOUNTER — Ambulatory Visit
Admission: RE | Admit: 2018-05-02 | Discharge: 2018-05-02 | Disposition: A | Discharge status: Home / Self Care | Payer: 59 | Source: Ambulatory Visit | Attending: Radiation Oncology | Admitting: Radiation Oncology

## 2018-05-02 DIAGNOSIS — Z510 Encounter for antineoplastic radiation therapy: Secondary | ICD-10-CM | POA: Diagnosis not present

## 2018-05-02 DIAGNOSIS — C50312 Malignant neoplasm of lower-inner quadrant of left female breast: Secondary | ICD-10-CM | POA: Diagnosis not present

## 2018-05-03 ENCOUNTER — Ambulatory Visit
Admission: RE | Admit: 2018-05-03 | Discharge: 2018-05-03 | Disposition: A | Discharge status: Home / Self Care | Payer: 59 | Source: Ambulatory Visit | Attending: Radiation Oncology | Admitting: Radiation Oncology

## 2018-05-03 DIAGNOSIS — Z510 Encounter for antineoplastic radiation therapy: Secondary | ICD-10-CM | POA: Diagnosis not present

## 2018-05-06 ENCOUNTER — Ambulatory Visit
Admission: RE | Admit: 2018-05-06 | Discharge: 2018-05-06 | Disposition: A | Discharge status: Home / Self Care | Payer: 59 | Source: Ambulatory Visit | Attending: Radiation Oncology | Admitting: Radiation Oncology

## 2018-05-06 ENCOUNTER — Other Ambulatory Visit: Payer: Self-pay | Admitting: Family Medicine

## 2018-05-06 DIAGNOSIS — Z510 Encounter for antineoplastic radiation therapy: Secondary | ICD-10-CM | POA: Diagnosis not present

## 2018-05-06 DIAGNOSIS — C50312 Malignant neoplasm of lower-inner quadrant of left female breast: Secondary | ICD-10-CM | POA: Diagnosis not present

## 2018-05-07 ENCOUNTER — Ambulatory Visit
Admission: RE | Admit: 2018-05-07 | Discharge: 2018-05-07 | Disposition: A | Discharge status: Home / Self Care | Payer: 59 | Source: Ambulatory Visit | Attending: Radiation Oncology | Admitting: Radiation Oncology

## 2018-05-07 ENCOUNTER — Telehealth (HOSPITAL_COMMUNITY): Payer: Self-pay

## 2018-05-07 DIAGNOSIS — Z510 Encounter for antineoplastic radiation therapy: Secondary | ICD-10-CM | POA: Diagnosis not present

## 2018-05-07 NOTE — Telephone Encounter (Signed)
Pt has been contacted by dr Meda Coffee and has been established with follow up for today.

## 2018-05-08 ENCOUNTER — Ambulatory Visit
Admission: RE | Admit: 2018-05-08 | Discharge: 2018-05-08 | Disposition: A | Discharge status: Home / Self Care | Payer: 59 | Source: Ambulatory Visit | Attending: Radiation Oncology | Admitting: Radiation Oncology

## 2018-05-08 DIAGNOSIS — Z510 Encounter for antineoplastic radiation therapy: Secondary | ICD-10-CM | POA: Diagnosis not present

## 2018-05-08 DIAGNOSIS — C50312 Malignant neoplasm of lower-inner quadrant of left female breast: Secondary | ICD-10-CM | POA: Diagnosis not present

## 2018-05-09 ENCOUNTER — Ambulatory Visit
Admission: RE | Admit: 2018-05-09 | Discharge: 2018-05-09 | Disposition: A | Discharge status: Home / Self Care | Payer: 59 | Source: Ambulatory Visit | Attending: Radiation Oncology | Admitting: Radiation Oncology

## 2018-05-09 DIAGNOSIS — C50312 Malignant neoplasm of lower-inner quadrant of left female breast: Secondary | ICD-10-CM | POA: Diagnosis not present

## 2018-05-09 DIAGNOSIS — Z510 Encounter for antineoplastic radiation therapy: Secondary | ICD-10-CM | POA: Diagnosis not present

## 2018-05-10 ENCOUNTER — Ambulatory Visit
Admission: RE | Admit: 2018-05-10 | Discharge: 2018-05-10 | Disposition: A | Discharge status: Home / Self Care | Payer: 59 | Source: Ambulatory Visit | Attending: Radiation Oncology | Admitting: Radiation Oncology

## 2018-05-10 DIAGNOSIS — Z510 Encounter for antineoplastic radiation therapy: Secondary | ICD-10-CM | POA: Diagnosis not present

## 2018-05-13 ENCOUNTER — Ambulatory Visit
Admission: RE | Admit: 2018-05-13 | Discharge: 2018-05-13 | Disposition: A | Discharge status: Home / Self Care | Payer: 59 | Source: Ambulatory Visit | Attending: Radiation Oncology | Admitting: Radiation Oncology

## 2018-05-13 DIAGNOSIS — Z510 Encounter for antineoplastic radiation therapy: Secondary | ICD-10-CM | POA: Diagnosis not present

## 2018-05-13 DIAGNOSIS — C50312 Malignant neoplasm of lower-inner quadrant of left female breast: Secondary | ICD-10-CM | POA: Diagnosis not present

## 2018-05-14 ENCOUNTER — Ambulatory Visit
Admission: RE | Admit: 2018-05-14 | Discharge: 2018-05-14 | Disposition: A | Discharge status: Home / Self Care | Payer: 59 | Source: Ambulatory Visit | Attending: Radiation Oncology | Admitting: Radiation Oncology

## 2018-05-14 DIAGNOSIS — C50312 Malignant neoplasm of lower-inner quadrant of left female breast: Secondary | ICD-10-CM | POA: Diagnosis not present

## 2018-05-14 DIAGNOSIS — Z510 Encounter for antineoplastic radiation therapy: Secondary | ICD-10-CM | POA: Diagnosis not present

## 2018-05-15 ENCOUNTER — Ambulatory Visit
Admission: RE | Admit: 2018-05-15 | Discharge: 2018-05-15 | Disposition: A | Discharge status: Home / Self Care | Payer: 59 | Source: Ambulatory Visit | Attending: Radiation Oncology | Admitting: Radiation Oncology

## 2018-05-15 DIAGNOSIS — Z510 Encounter for antineoplastic radiation therapy: Secondary | ICD-10-CM | POA: Diagnosis not present

## 2018-05-16 ENCOUNTER — Ambulatory Visit
Admission: RE | Admit: 2018-05-16 | Discharge: 2018-05-16 | Disposition: A | Discharge status: Home / Self Care | Payer: 59 | Source: Ambulatory Visit | Attending: Radiation Oncology | Admitting: Radiation Oncology

## 2018-05-16 DIAGNOSIS — Z510 Encounter for antineoplastic radiation therapy: Secondary | ICD-10-CM | POA: Diagnosis not present

## 2018-05-16 DIAGNOSIS — C50312 Malignant neoplasm of lower-inner quadrant of left female breast: Secondary | ICD-10-CM | POA: Diagnosis not present

## 2018-05-17 ENCOUNTER — Ambulatory Visit
Admission: RE | Admit: 2018-05-17 | Discharge: 2018-05-17 | Disposition: A | Discharge status: Home / Self Care | Payer: 59 | Source: Ambulatory Visit | Attending: Radiation Oncology | Admitting: Radiation Oncology

## 2018-05-17 DIAGNOSIS — Z510 Encounter for antineoplastic radiation therapy: Secondary | ICD-10-CM | POA: Diagnosis not present

## 2018-05-20 ENCOUNTER — Ambulatory Visit
Admission: RE | Admit: 2018-05-20 | Discharge: 2018-05-20 | Disposition: A | Discharge status: Home / Self Care | Payer: 59 | Source: Ambulatory Visit | Attending: Radiation Oncology | Admitting: Radiation Oncology

## 2018-05-20 ENCOUNTER — Ambulatory Visit: Payer: 59 | Admitting: Radiation Oncology

## 2018-05-20 DIAGNOSIS — Z510 Encounter for antineoplastic radiation therapy: Secondary | ICD-10-CM | POA: Diagnosis not present

## 2018-05-20 DIAGNOSIS — C50312 Malignant neoplasm of lower-inner quadrant of left female breast: Secondary | ICD-10-CM | POA: Diagnosis not present

## 2018-05-21 ENCOUNTER — Ambulatory Visit
Admission: RE | Admit: 2018-05-21 | Discharge: 2018-05-21 | Disposition: A | Discharge status: Home / Self Care | Payer: 59 | Source: Ambulatory Visit | Attending: Radiation Oncology | Admitting: Radiation Oncology

## 2018-05-21 DIAGNOSIS — Z510 Encounter for antineoplastic radiation therapy: Secondary | ICD-10-CM | POA: Diagnosis not present

## 2018-05-22 ENCOUNTER — Ambulatory Visit
Admission: RE | Admit: 2018-05-22 | Discharge: 2018-05-22 | Disposition: A | Discharge status: Home / Self Care | Payer: 59 | Source: Ambulatory Visit | Attending: Radiation Oncology | Admitting: Radiation Oncology

## 2018-05-22 DIAGNOSIS — Z510 Encounter for antineoplastic radiation therapy: Secondary | ICD-10-CM | POA: Diagnosis not present

## 2018-05-23 ENCOUNTER — Ambulatory Visit
Admission: RE | Admit: 2018-05-23 | Discharge: 2018-05-23 | Disposition: A | Discharge status: Home / Self Care | Payer: 59 | Source: Ambulatory Visit | Attending: Radiation Oncology | Admitting: Radiation Oncology

## 2018-05-23 DIAGNOSIS — C50312 Malignant neoplasm of lower-inner quadrant of left female breast: Secondary | ICD-10-CM | POA: Diagnosis not present

## 2018-05-23 DIAGNOSIS — Z510 Encounter for antineoplastic radiation therapy: Secondary | ICD-10-CM | POA: Diagnosis not present

## 2018-05-24 ENCOUNTER — Ambulatory Visit
Admission: RE | Admit: 2018-05-24 | Discharge: 2018-05-24 | Disposition: A | Discharge status: Home / Self Care | Payer: 59 | Source: Ambulatory Visit | Attending: Radiation Oncology | Admitting: Radiation Oncology

## 2018-05-24 DIAGNOSIS — Z510 Encounter for antineoplastic radiation therapy: Secondary | ICD-10-CM | POA: Diagnosis not present

## 2018-05-27 ENCOUNTER — Ambulatory Visit
Admission: RE | Admit: 2018-05-27 | Discharge: 2018-05-27 | Disposition: A | Discharge status: Home / Self Care | Payer: 59 | Source: Ambulatory Visit | Attending: Radiation Oncology | Admitting: Radiation Oncology

## 2018-05-27 ENCOUNTER — Ambulatory Visit: Payer: 59 | Admitting: Radiation Oncology

## 2018-05-27 ENCOUNTER — Inpatient Hospital Stay: Payer: 59 | Attending: Hematology | Admitting: Hematology

## 2018-05-27 DIAGNOSIS — C50312 Malignant neoplasm of lower-inner quadrant of left female breast: Secondary | ICD-10-CM | POA: Diagnosis not present

## 2018-05-27 DIAGNOSIS — Z510 Encounter for antineoplastic radiation therapy: Secondary | ICD-10-CM | POA: Diagnosis not present

## 2018-05-28 ENCOUNTER — Ambulatory Visit
Admission: RE | Admit: 2018-05-28 | Discharge: 2018-05-28 | Disposition: A | Discharge status: Home / Self Care | Payer: 59 | Source: Ambulatory Visit | Attending: Radiation Oncology | Admitting: Radiation Oncology

## 2018-05-28 DIAGNOSIS — Z510 Encounter for antineoplastic radiation therapy: Secondary | ICD-10-CM | POA: Diagnosis not present

## 2018-05-28 DIAGNOSIS — Z170 Estrogen receptor positive status [ER+]: Secondary | ICD-10-CM | POA: Diagnosis not present

## 2018-05-28 DIAGNOSIS — C50312 Malignant neoplasm of lower-inner quadrant of left female breast: Secondary | ICD-10-CM | POA: Insufficient documentation

## 2018-05-29 ENCOUNTER — Ambulatory Visit
Admission: RE | Admit: 2018-05-29 | Discharge: 2018-05-29 | Disposition: A | Discharge status: Home / Self Care | Payer: 59 | Source: Ambulatory Visit | Attending: Radiation Oncology | Admitting: Radiation Oncology

## 2018-05-29 ENCOUNTER — Encounter: Payer: Self-pay | Admitting: Hematology

## 2018-05-29 DIAGNOSIS — Z510 Encounter for antineoplastic radiation therapy: Secondary | ICD-10-CM | POA: Diagnosis not present

## 2018-05-29 DIAGNOSIS — C50312 Malignant neoplasm of lower-inner quadrant of left female breast: Secondary | ICD-10-CM | POA: Diagnosis not present

## 2018-05-30 ENCOUNTER — Ambulatory Visit
Admission: RE | Admit: 2018-05-30 | Discharge: 2018-05-30 | Disposition: A | Discharge status: Home / Self Care | Payer: 59 | Source: Ambulatory Visit | Attending: Radiation Oncology | Admitting: Radiation Oncology

## 2018-05-30 ENCOUNTER — Telehealth: Payer: Self-pay | Admitting: Hematology

## 2018-05-30 DIAGNOSIS — Z510 Encounter for antineoplastic radiation therapy: Secondary | ICD-10-CM | POA: Diagnosis not present

## 2018-05-30 DIAGNOSIS — C50312 Malignant neoplasm of lower-inner quadrant of left female breast: Secondary | ICD-10-CM | POA: Diagnosis not present

## 2018-05-30 NOTE — Telephone Encounter (Signed)
Scheduled per 10/2 sch message - left message for patient with appt date and time

## 2018-05-31 ENCOUNTER — Ambulatory Visit
Admission: RE | Admit: 2018-05-31 | Discharge: 2018-05-31 | Disposition: A | Discharge status: Home / Self Care | Payer: 59 | Source: Ambulatory Visit | Attending: Radiation Oncology | Admitting: Radiation Oncology

## 2018-05-31 DIAGNOSIS — Z923 Personal history of irradiation: Secondary | ICD-10-CM | POA: Diagnosis not present

## 2018-05-31 DIAGNOSIS — Z170 Estrogen receptor positive status [ER+]: Secondary | ICD-10-CM | POA: Diagnosis not present

## 2018-05-31 DIAGNOSIS — C50312 Malignant neoplasm of lower-inner quadrant of left female breast: Secondary | ICD-10-CM | POA: Diagnosis not present

## 2018-05-31 DIAGNOSIS — Z510 Encounter for antineoplastic radiation therapy: Secondary | ICD-10-CM | POA: Diagnosis not present

## 2018-05-31 DIAGNOSIS — C773 Secondary and unspecified malignant neoplasm of axilla and upper limb lymph nodes: Secondary | ICD-10-CM | POA: Diagnosis not present

## 2018-05-31 NOTE — Progress Notes (Signed)
Martha [name]  [phone] [phone]  Clinic Follow Up Note   Patient Care Team: [name], MD as PCP - General (Family Medicine) [name], MD as Consulting Physician (General Surgery) Truitt Merle, MD as Consulting Physician (Hematology) Eppie Gibson, MD as Attending Physician (Radiation Oncology)  Date of Service:  06/03/2018  CHIEF COMPLAINTS:  Malignant neoplasm of lower-inner quadrant of left breast    Oncology History   Cancer Staging Malignant neoplasm of lower-inner quadrant of left breast in female, estrogen receptor positive ([place]) Staging form: Breast, AJCC 8th Edition - Clinical stage from 01/16/2018: Stage IA (cT1c, cN0, cM0, G2, ER+, PR+, HER2-) - Signed by Truitt Merle, MD on 01/23/2018       Malignant neoplasm of lower-inner quadrant of left breast in female, estrogen receptor positive ([place])   01/15/2018 Mammogram    Diagnositc Mammogram 01/15/18  IMPRESSION: 1. Suspicious 1.2 x 1.4 x 1.3 cm mixed echogenicity mass left breast 7 o'clock position retroareolar location at the site of palpable concern.. 2. Indeterminate Within the left breast 7:30 o'clock retroareolar location, adjacent to the palpable mass, is a 0.5 x 0.4 x 0.5 cm oval circumscribed hypoechoic mass. 3. Indeterminate calcifications within the lateral left breast. Location of these calcifications is not definitely confirmed on the true lateral view.     01/16/2018 Initial Biopsy    Diagnosis 01/16/18 1. Breast, left, needle core biopsy, 7:30 o'clock (ribbon clip) - FIBROCYSTIC CHANGES WITH SCLEROSING ADENOSIS AND CALCIFICATIONS. - FIBROADENOMATOID CHANGE. - NO MALIGNANCY IDENTIFIED. 2. Breast, left, needle core biopsy, 7 o'clock position (coil clip) - INVASIVE MAMMARY CARCINOMA, MSBR GRADE I/II. - SEE MICROSCOPIC DESCRIPTION Microscopic Comment  ADDENDUM: Immunohistochemistry for E-Cadherin is strongly positive in the tumor consistent with  ductal carcinoma. (JDP:ah 01/17/18)    01/16/2018 Receptors her2    Estrogen Receptor: 100%, POSITIVE, STRONG STAINING INTENSITY Progesterone Receptor: 50%, POSITIVE, STRONG STAINING INTENSITY Proliferation Marker Ki67: 20% HER2 Negative    01/16/2018 Cancer Staging    Staging form: Breast, AJCC 8th Edition - Clinical stage from 01/16/2018: Stage IA (cT1c, cN0, cM0, G2, ER+, PR+, HER2-) - Signed by Truitt Merle, MD on 01/23/2018    01/22/2018 Initial Diagnosis    Malignant neoplasm of lower-inner quadrant of left breast in female, estrogen receptor positive ([place])    02/21/2018 Surgery     LEFT BREAST LUMPECTOMY WITH AXILLARY LYMPH NODE BIOPSY by Dr. Barry Dienes  02/21/18    02/21/2018 Pathology Results    Diagnosis 02/21/18 1. Breast, lumpectomy, Left - INVASIVE DUCTAL CARCINOMA, GRADE I, 1.6 CM. - DUCTAL CARCINOMA IN SITU, INTERMEDIATE NUCLEAR GRADE. - ANTERIOR AND MEDIAL RESECTION MARGINS ARE POSITIVE FOR CARCINOMA. - NEGATIVE FOR LYMPHOVASCULAR OR PERINEURAL INVASION. - BACKGROUND BREAST TISSUE WITH FIBROCYSTIC CHANGE, INCLUDING SCLEROSING ADENOSIS. - BIOPSY SITE CHANGES. - SEE ONCOLOGY TABLE. 2. Lymph node, sentinel, biopsy, Left Axillary #1 - METASTATIC BREAST CARCINOMA TO A LYMPH NODE, 1.0 CM IN GREATEST DIMENSION, WITH EXTRANODAL EXTENSION (1/1). 3. Lymph node, sentinel, biopsy, Left Axillary #2 - LYMPH NODE, NEGATIVE FOR CARCINOMA (0/1).     02/21/2018 Miscellaneous    Mammaprint 02/21/18 Low Risk with 10-year risk of recurrnce at 10% -No potential signifcant chemotherapy benefit    03/20/2018 Pathology Results    RE-EXCISION OF BREAST LUMPECTOMY by Dr. Barry Dienes  Diagnosis 03/20/18 1. Breast, excision, Left new anterior margin - FIBROCYSTIC CHANGES WITH ADENOSIS AND CALCIFICATIONS. - HEALING BIOPSY SITE. - THERE IS NO EVIDENCE OF MALIGNANCY. 2. Breast, excision, Left new medial margin - FIBROCYSTIC CHANGES  WITH ADENOSIS AND CALCIFICATIONS. - HEALING BIOPSY SITE. - THERE IS NO  EVIDENCE OF MALIGNANCY. Microscopic Comment 1. -2. The surgical resection margin(s) of the specimen were inked and microscopically evaluated. (JBK:kh 03-22-18)    04/09/2018 Cancer Staging    Staging form: Breast, AJCC 8th Edition - Pathologic: Stage IA (pT1c, pN1, cM0, G1, ER+, PR+, HER2-) - Signed by Eppie Gibson, MD on 04/09/2018    04/22/2018 -  Radiation Therapy    Radaiton with Dr. Isidore Moos 04/22/18-06/03/18    05/2018 -  Anti-estrogen oral therapy    Letrozole started 05/2018     HISTORY OF PRESENTING ILLNESS:  01/23/18  Martha [name] 57 y.o. female is a here because of newly diagnosed left breast cancer. The patient presents to the clinic today by herself.   She found a lump herself 2 months ago when she was in the shower. Upon mammogram the lump she felt was a cyst and her tumor was found next to it. She had a lumpectomy in her 65s due to infertility medication at the time. She has always had dense breast and had been doing 3D mammograms most of the time.   Today she notes she feels fine and healthy overall.  Socially she is unemployed, married and has a Psychiatrist. She drinks 30 light beers a week. She is still smoking and is willing to quit smoking again. She set a date with Dr. Isidore Moos to stop. She had been smoking for 40 years 1 pack a day. She notes she is active.   She has hypothyroidism and takes levothyroxine and she takes Paxil to help with Menopause and hot flashes. She had a uterine ablation due to menorrhagia. She started menopause 3-4 years ago. She had 2 ectopic pregnancy. She had b/l salpingectomy. She had appendectomy in the past. She notes her sister had melanoma from sun exposure.     GYN HISTORY  Menarchal: 52 LMP: Age 43, had uterine ablation due to menorrhagia. Started menopause in her 10s Contraceptive: Yes, 1980-1986 HRT: Yes, 1 year of Combipatch until 2018 G2P0A2: 2 ectopic pregnancy.   CURRENT THERAPY started daily radiation therapy with Dr. Isidore Moos,  completed on 05/29/2018. Letrozole started in 05/2018  INTERVAL HISTORY Martha [name] is a 57 y.o. female who is here for follow up. She had left breast lumpectomy with Dr. Barry Dienes on 02/21/2018 and re-excision on 03/20/2018. She also started daily radiation therapy with Dr. Isidore Moos on  04/22/2018, and finished today   Today, she is here alone at the clinic. She tolerated radiation very well, but unfortunately had a surgical scar infection during radiation. She had left clavicular pain where the seatbelt touches, but is improving.  She had uterine ablation in 2010. She experienced mood swings after her uterine ablation.   MEDICAL HISTORY:  Past Medical History:  Diagnosis Date  . Dysrhythmia    SVT, s/p ablation ~ 2012 in at [facility]  . PONV (postoperative nausea and vomiting)   . Thyroid disease     SURGICAL HISTORY: Past Surgical History:  Procedure Laterality Date  . APPENDECTOMY    . BREAST BIOPSY Right 2014   fibroadenoma  . BREAST EXCISIONAL BIOPSY Left 1990   benign  . BREAST LUMPECTOMY WITH AXILLARY LYMPH NODE BIOPSY Left 02/21/2018   Procedure: LEFT BREAST LUMPECTOMY WITH AXILLARY LYMPH NODE BIOPSY;  Surgeon: [name], MD;  Location: [place];  Service: General;  Laterality: Left;  . BREAST SURGERY    . EYE SURGERY    . RE-EXCISION OF  BREAST LUMPECTOMY Left 03/20/2018   Procedure: RE-EXCISION OF BREAST LUMPECTOMY;  Surgeon: [name], MD;  Location: [place];  Service: General;  Laterality: Left;    SOCIAL HISTORY: Social History   Socioeconomic History  . Marital status: Married    Spouse name: Not on file  . Number of children: Not on file  . Years of education: Not on file  . Highest education level: Not on file  Occupational History  . Not on file  Social Needs  . Financial resource strain: Not on file  . Food insecurity:    Worry: Not on file    Inability: Not on file  . Transportation needs:    Medical: Not on file     Non-medical: Not on file  Tobacco Use  . Smoking status: Current Every Day Smoker    Packs/day: 1.00    Years: 40.00    Pack years: 40.00  . Smokeless tobacco: Never Used  Substance and Sexual Activity  . Alcohol use: Yes    Alcohol/week: 30.0 standard drinks    Types: 30 Cans of beer per week    Comment: 30 drinks per week   . Drug use: No  . Sexual activity: Yes  Lifestyle  . Physical activity:    Days per week: Not on file    Minutes per session: Not on file  . Stress: Not on file  Relationships  . Social connections:    Talks on phone: Not on file    Gets together: Not on file    Attends religious service: Not on file    Active member of club or organization: Not on file    Attends meetings of clubs or organizations: Not on file    Relationship status: Not on file  . Intimate partner violence:    Fear of current or ex partner: No    Emotionally abused: No    Physically abused: No    Forced sexual activity: No  Other Topics Concern  . Not on file  Social History Narrative  . Not on file    FAMILY HISTORY: Family History  Problem Relation Age of Onset  . Heart disease Mother   . Hyperlipidemia Sister     ALLERGIES:  has No Known Allergies.  MEDICATIONS:  Current Outpatient Medications  Medication Sig Dispense Refill  . levothyroxine (SYNTHROID, LEVOTHROID) 75 MCG tablet Take 1 tablet (75 mcg total) by mouth daily before breakfast. 90 tablet 0  . OVER THE COUNTER MEDICATION Take 1 tablet by mouth daily. Protandim Supplement    . PARoxetine (PAXIL) 20 MG tablet TAKE 1 TABLET BY MOUTH DAILY AS DIRECTED. 90 tablet 1  . Probiotic Product (PROBIOTIC PO) Take 1 capsule by mouth daily.    Marland Kitchen letrozole (FEMARA) 2.5 MG tablet Take 1 tablet (2.5 mg total) by mouth daily. 30 tablet 3   No current facility-administered medications for this visit.     REVIEW OF SYSTEMS:   Constitutional: Denies fevers, chills or abnormal night sweats Eyes: Denies blurriness of vision,  double vision or watery eyes Ears, nose, mouth, throat, and face: Denies mucositis or sore throat Respiratory: Denies cough, dyspnea or wheezes Cardiovascular: Denies palpitation, chest discomfort or lower extremity swelling Gastrointestinal:  Denies nausea, heartburn or change in bowel habits Skin: Denies abnormal skin rashes (+) left clavicular bone pain and redness where the seatbelt touches, improving Lymphatics: Denies new lymphadenopathy or easy bruising Neurological:Denies numbness, tingling or new weaknesses Behavioral/Psych: Mood is stable, no new changes  All other systems were reviewed with the patient and are negative.  PHYSICAL EXAMINATION: ECOG PERFORMANCE STATUS: 0 - Asymptomatic  Vitals:   06/03/18 1359  BP: 138/82  Pulse: 83  Resp: 18  Temp: 98.8 F (37.1 C)  SpO2: 98%   Filed Weights   06/03/18 1359  Weight: 122 lb 9.6 oz (55.6 kg)    GENERAL:alert, no distress and comfortable SKIN: skin color, texture, turgor are normal, no rashes or significant lesions EYES: normal, conjunctiva are pink and non-injected, sclera clear OROPHARYNX:no exudate, no erythema and lips, buccal mucosa, and tongue normal  NECK: supple, thyroid normal size, non-tender, without nodularity LYMPH:  no palpable lymphadenopathy in the cervical, axillary or inguinal LUNGS: clear to auscultation and percussion with normal breathing effort HEART: regular rate & rhythm and no murmurs and no lower extremity edema ABDOMEN:abdomen soft, non-tender and normal bowel sounds Musculoskeletal:no cyanosis of digits and no clubbing  PSYCH: alert & oriented x 3 with fluent speech NEURO: no focal motor/sensory deficits Breast: (+) skin peeling and hyperpigmentation around left breast at radiation site. Surgical scar in the left breast has healed very well, no palpable mass or adenopathy.  No other palpable lump and adenopathy in breasts or axilla.   LABORATORY DATA:  I have reviewed the data as  listed CBC Latest Ref Rng & Units 06/03/2018 02/14/2018 01/23/2018  WBC 3.9 - 10.3 K/uL 6.3 5.4 10.7(H)  Hemoglobin 11.6 - 15.9 g/dL 14.7 14.6 14.5  Hematocrit 34.8 - 46.6 % 43.0 45.0 43.5  Platelets 145 - 400 K/uL 204 190 209    CMP Latest Ref Rng & Units 06/03/2018 02/14/2018 01/23/2018  Glucose 70 - 99 mg/dL 99 93 109  BUN 6 - 20 mg/dL 10 <5(L) 15  Creatinine 0.44 - 1.00 mg/dL 0.69 0.60 0.80  Sodium 135 - 145 mmol/L 140 142 140  Potassium 3.5 - 5.1 mmol/L 3.9 4.2 5.2(H)  Chloride 98 - 111 mmol/L 101 107 104  CO2 22 - 32 mmol/L 29 27 25   Calcium 8.9 - 10.3 mg/dL 10.0 9.4 9.8  Total Protein 6.5 - 8.1 g/dL 7.6 - 7.4  Total Bilirubin 0.3 - 1.2 mg/dL 0.8 - 0.7  Alkaline Phos 38 - 126 U/L 86 - 89  AST 15 - 41 U/L 18 - 19  ALT 0 - 44 U/L 12 - 17     Surgery  03/20/2018: Left RE-EXCISION OF BREAST LUMPECTOMY with [name], MD 02/21/2018: Left BREAST LUMPECTOMY WITH AXILLARY LYMPH NODE BIOPSY with [name], MD   PATHOLOGY  03/20/2018 Surgical Pathology Diagnosis 1. Breast, excision, Left new anterior margin - FIBROCYSTIC CHANGES WITH ADENOSIS AND CALCIFICATIONS. - HEALING BIOPSY SITE. - THERE IS NO EVIDENCE OF MALIGNANCY. 2. Breast, excision, Left new medial margin - FIBROCYSTIC CHANGES WITH ADENOSIS AND CALCIFICATIONS. - HEALING BIOPSY SITE. - THERE IS NO EVIDENCE OF MALIGNANCY. Microscopic Comment 1. -2. The surgical resection margin(s) of the specimen were inked and microscopically evaluated. (JBK:kh 03-22-18)pecimen Gross and Clinical Information Specimen(s) Obtained: 1. Breast, excision, Left new anterior margin 2. Breast, excision, Left new medial margin Gross 1. Received fresh is a 3.7 x 2.4 x 1.7 cm fibrofatty tissue, clinically re-excision of left lumpectomy, new anterior margin. The specimen is inked as follows: green anterior, blue inferior, orange lateral, yellow medial, red superior. One aspect opposite of anterior is uninked. The Sectioned and entirely  submitted in nine blocks. 2. Received fresh is a 4 x 2.4 x 1.3 cm fibrofatty tissue, clinically re-excision left lumpectomy, new medial margin, which  is inked as follows: green anterior, blue inferior, yellow medial, black posterior, red superior. The surface opposite of medial is uninked. Sectioned and entirely submitted in nine blocks. (SW:gt, 03/21/18)   02/21/2018 Molecular Pathology    02/21/2018 Surgical Pathology Diagnosis 1. Breast, lumpectomy, Left - INVASIVE DUCTAL CARCINOMA, GRADE I, 1.6 CM. - DUCTAL CARCINOMA IN SITU, INTERMEDIATE NUCLEAR GRADE. - ANTERIOR AND MEDIAL RESECTION MARGINS ARE POSITIVE FOR CARCINOMA. - NEGATIVE FOR LYMPHOVASCULAR OR PERINEURAL INVASION. - BACKGROUND BREAST TISSUE WITH FIBROCYSTIC CHANGE, INCLUDING SCLEROSING ADENOSIS. - BIOPSY SITE CHANGES. - SEE ONCOLOGY TABLE. 2. Lymph node, sentinel, biopsy, Left Axillary #1 - METASTATIC BREAST CARCINOMA TO A LYMPH NODE, 1.0 CM IN GREATEST DIMENSION, WITH EXTRANODAL EXTENSION (1/1). 3. Lymph node, sentinel, biopsy, Left Axillary #2 - LYMPH NODE, NEGATIVE FOR CARCINOMA (0/1). Microscopic Comment 1. INVASIVE CARCINOMA OF THE BREAST: Resection Procedure: Lumpectomy. Specimen Laterality: Left. Tumor Size: 1.6 cm. Histologic Type: Invasive ductal carcinoma. Histologic Grade: Glandular (Acinar)/Tubular Differentiation: 2. Nuclear Pleomorphism: 2. Mitotic Rate: 1. Overall Grade: I. Ductal Carcinoma In Situ: Present, intermediate nuclear grade. Tumor Extension: Not applicable. Margins: Anterior and medial margins are involved by invasive carcinoma. DCIS Margins: (required only if DCIS is present in specimen) Distance from closest margin (millimeters): Greater than 10 mm. Regional Lymph Nodes: Number of Lymph Nodes Examined: 2. Number of Sentinel Nodes Examined (if applicable): 2. Number of Lymph Nodes with Macrometastases (>2 mm): 1. Number of Lymph Nodes with Micrometastases: 0. Number of Lymph Nodes  with Isolated Tumor Cells (?0.2 mm or ?200 cells)#: 0. Size of Largest Metastatic Deposit (millimeters): 10 mm. Extranodal Extension: Present. Treatment Effect: No known presurgical therapy. Breast Biomarker Testing Performed on Previous Biopsy: Yes. Testing Performed on Case Number: [id number]. Estrogen Receptor: 100%, strong staining intensity. Progesterone Receptor: 50%, strong staining intensity. HER2: Negative. Representative tumor block: 1A. Pathologic Stage Classification (pTNM, AJCC 8th Edition): pT1c, pN1a. (v4.2.0.0)nformation Specimen(s) Obtained: 1. Breast, lumpectomy, Left 2. Lymph node, sentinel, biopsy, Left Axillary #1 3. Lymph node, sentinel, biopsy, Left Axillary #2 Specimen Clinical Information 1. Left breast cancer (nt) Gross 1. Specimen type: Left breast lumpectomy, in formalin at 1300 hours. Size: 3 cm from superior to inferior, 2.1 cm from medial to lateral, 2.5 cm from anterior to posterior. Orientation: Green anterior, blue inferior, orange lateral, yellow medial, black posterior, red superior. Also over anterior is a 2 x 0.7 cm tan-white wrinkled skin ellipse. Localized area: None. Cut surface: There is a 1.3 x 1.3 x 1.2 cm gray-white firm ill-defined mass which contains a coil clip. Margins: The mass abuts portions of lateral and posterior margins, and has surrounding gray-white vaguely nodular fibrous tissue which abuts multiple margins. Prognostic indicators: Not taken at time of gross. Block summary: A-C = mass, full thickness sections to include anterior, posterior, medial and lateral margins D-E = full thickness sections of tissue adjacent to mass, to include anterior, posterior, medial and lateral margins F = superior margin nearest mass G = inferior margin nearest mass The specimen is entirely submitted. Total: Seven blocks. 2. Rapid Intraoperative Consult performed (Yes or No): No. Specimen: Left axillary sentinel node. Number and size: One  node, 1.7 cm. Cut Surface(s): There is a 0.5 cm well defined firm gray-white nodule. Block Summary: Bisected and submitted in one block for routine histology. 3. Rapid Intraoperative Consult performed (Yes or No): No. Specimen: Left axillary sentinel node. Number and size: One node, 0.8 cm. Cut Surface(s): Yellow, pink, soft, non-blue. Block Summary: Bisected and submitted in one block for routine histology. (  SSW:ah 02/21/18)   01/22/2018 Surgical Pathology Diagnosis Breast, left, needle core biopsy, upper outer calcifications x clip - FIBROADENOMA WITH CALCIFICATIONS. - FIBROCYSTIC CHANGES WITH ADENOSIS AND CALCIFICATIONS. - THERE IS NO EVIDENCE OF MALIGNANCY. - SEE COMMENT. Microscopic Comment The results are called to [facility] on 01/23/18. (JBK:gt, 01/23/18)formation Specimen Comment Formalin time: 11:25 AM; extracted < 5 min; group of calcs, recent diagnosis left breast malignancy 7 o'clock; previous biopsy x 2 on the left breast Specimen(s) Obtained: Breast, left, needle core biopsy, upper outer calcifications x clip Specimen Clinical Information Suspect FCC or DCIS Gross Received in formalin (TIF =11:25am, CIT = less than five minutes) labeled with the patient's name and "left breast calcs outer" are three cores of tan-yellow soft tissue averaging 1.7 cm. Two cores are clinically desingated as calcifications and are submitted in 1A. The remaining core is submitted in 1B. (AK 01/22/2018)   Diagnosis 01/16/18 1. Breast, left, needle core biopsy, 7:30 o'clock (ribbon clip) - FIBROCYSTIC CHANGES WITH SCLEROSING ADENOSIS AND CALCIFICATIONS. - FIBROADENOMATOID CHANGE. - NO MALIGNANCY IDENTIFIED. 2. Breast, left, needle core biopsy, 7 o'clock position (coil clip) - INVASIVE MAMMARY CARCINOMA, MSBR GRADE I/II. - SEE MICROSCOPIC DESCRIPTION Microscopic Comment 2. E-Cadherin and breast prognostic profile will be performed. Dr. Vic Ripper agrees. Called to the  [facility] on 01/17/18. (JDP:kh 01/17/18) ADDENDUM: Immunohistochemistry for E-Cadherin is strongly positive in the tumor consistent with ductal carcinoma. (JDP:ah 01/17/18) 2. PROGNOSTIC INDICATORS Results: IMMUNOHISTOCHEMICAL AND MORPHOMETRIC ANALYSIS PERFORMED MANUALLY Estrogen Receptor: 100%, POSITIVE, STRONG STAINING INTENSITY Progesterone Receptor: 50%, POSITIVE, STRONG STAINING INTENSITY Proliferation Marker Ki67: 20% REFERENCE RANGE ESTROGEN RECEPTOR NEGATIVE 0% POSITIVE =>1% REFERENCE RANGE PROGESTERONE RECEPTOR NEGATIVE 0% POSITIVE =>1% All controls stained appropriately 2. FLUORESCENCE IN-SITU HYBRIDIZATION Results: HER2 - NEGATIVE RATIO OF HER2/CEP17 SIGNALS 1.05 AVERAGE HER2 COPY NUMBER PER CELL 1.15 Reference Range: NEGATIVE HER2/CEP17 Ratio <2.0 and average HER2 copy number <4.0 EQUIVOCAL HER2/CEP17 Ratio <2.0 and average HER2 copy number >=4.0 and <6.0 POSITIVE HER2/CEP17 Ratio >=2.0 or <2.0 and average HER2 copy number >=6.0   RADIOGRAPHIC STUDIES: I have personally reviewed the radiological images as listed and agreed with the findings in the report. No results found.   01/15/2018 US Breast IMPRESSION: 1. Suspicious mass left breast 7 o'clock position corresponding with palpable abnormality. 2. Indeterminate hypoechoic left breast mass 7:30 o'clock, potentially representing a complicated cyst. 3. Indeterminate calcifications within the lateral left breast. Location of these calcifications is not definitely confirmed on the true lateral view.  01/15/2018 Mammogram IMPRESSION: 1. Suspicious mass left breast 7 o'clock position corresponding with palpable abnormality. 2. Indeterminate hypoechoic left breast mass 7:30 o'clock, potentially representing a complicated cyst. 3. Indeterminate calcifications within the lateral left breast. Location of these calcifications is not definitely confirmed on the true lateral view.  ASSESSMENT & PLAN:   Martha [name] is a 57 y.o. Caucasian female with a history of Thyroid disease and heart murmur.   1. Malignant neoplasm of lower-inner quadrant of left breast, invasive ductal carcinoma, stage IA, pT1cN1M0, ER/PR Positive, HER2 Negative, Grade I, Mammaprint low risk  --I reviewed her surgical pathology findings with patient in details, she unfortunately was found to have one positive lymph nodes, surgical margins were negative. -I reviewed her mammaprint test result, which showed low risk disease, rheumatoid type A.  No adjuvant chemotherapy is recommended due to her low risk disease.  She is quite pleased. -She has completed adjuvant breast radiation, tolerated well. -She previously had endometrial ablation, and has had some  hot flash in the past few years, probably postmenopausal.  I will check her [place] and estradiol level today to confirm. --Given the strong ER and PR positivity, I do recommend adjuvant aromatase inhibitor to reduce her risk of cancer recurrence,  The potential benefit and side effects, which includes but not limited to, hot flash, skin and vaginal dryness, metabolic changes ( increased blood glucose, cholesterol, weight, etc.), slightly in increased risk of cardiovascular disease, cataracts, muscular and joint discomfort, osteopenia and osteoporosis, etc, were discussed with her in great details. She is interested, and we'll start in a few weeks when she recovers form RT -I called in letrozole for her today  -I educated her about exercising, and maintaining a healthy diet. I advised her to call if her symptoms are severe or bothering her. I also advised her to follow up with her PCP for regular BG and Cholesterol check ups. -Survivorship in 3 months -f/u in 6 months   2. Smoking Cessation, Alcohol Use  -She has been smoking for 40 years 1 pack a day. She is eligible for lung cancer screenings. She is interested.  -She has set a date to quit smoking. I encouraged her to  continue to work at this.  -I recommend she cut down on her alcohol intake. She is willing to do this.   3.  Hypothyroidism -New medication and follow-up with PCP   PLAN:  -Lab today including [place] and estradiol -DEXA at [facility] in 1-2 months -She will start Letrozole in a few weeks. I prescribed today. -Survivorship in 3 months -f/u and lab in 6 months  All questions were answered. The patient knows to call the clinic with any problems, questions or concerns. I spent 25 minutes counseling the patient face to face. The total time spent in the appointment was 30 minutes and more than 50% was on counseling.  Dierdre Searles Dweik am acting as scribe for Dr. Truitt Merle.  I have reviewed the above documentation for accuracy and completeness, and I agree with the above.    Truitt Merle, MD 06/03/2018

## 2018-06-03 ENCOUNTER — Inpatient Hospital Stay: Payer: 59

## 2018-06-03 ENCOUNTER — Encounter: Payer: Self-pay | Admitting: Hematology

## 2018-06-03 ENCOUNTER — Telehealth: Payer: Self-pay | Admitting: Hematology

## 2018-06-03 ENCOUNTER — Inpatient Hospital Stay: Payer: 59 | Attending: Hematology | Admitting: Hematology

## 2018-06-03 ENCOUNTER — Encounter: Payer: Self-pay | Admitting: Radiation Oncology

## 2018-06-03 ENCOUNTER — Ambulatory Visit
Admission: RE | Admit: 2018-06-03 | Discharge: 2018-06-03 | Disposition: A | Discharge status: Home / Self Care | Payer: 59 | Source: Ambulatory Visit | Attending: Radiation Oncology | Admitting: Radiation Oncology

## 2018-06-03 VITALS — BP 138/82 | HR 83 | Temp 98.8°F | Resp 18 | Ht 62.5 in | Wt 122.6 lb

## 2018-06-03 DIAGNOSIS — E039 Hypothyroidism, unspecified: Secondary | ICD-10-CM | POA: Diagnosis not present

## 2018-06-03 DIAGNOSIS — Z923 Personal history of irradiation: Secondary | ICD-10-CM | POA: Diagnosis not present

## 2018-06-03 DIAGNOSIS — Z170 Estrogen receptor positive status [ER+]: Principal | ICD-10-CM

## 2018-06-03 DIAGNOSIS — C50312 Malignant neoplasm of lower-inner quadrant of left female breast: Secondary | ICD-10-CM

## 2018-06-03 DIAGNOSIS — Z510 Encounter for antineoplastic radiation therapy: Secondary | ICD-10-CM | POA: Diagnosis not present

## 2018-06-03 DIAGNOSIS — Z79811 Long term (current) use of aromatase inhibitors: Secondary | ICD-10-CM | POA: Insufficient documentation

## 2018-06-03 DIAGNOSIS — F17210 Nicotine dependence, cigarettes, uncomplicated: Secondary | ICD-10-CM | POA: Diagnosis not present

## 2018-06-03 DIAGNOSIS — Z23 Encounter for immunization: Secondary | ICD-10-CM | POA: Insufficient documentation

## 2018-06-03 DIAGNOSIS — E2839 Other primary ovarian failure: Secondary | ICD-10-CM

## 2018-06-03 DIAGNOSIS — C773 Secondary and unspecified malignant neoplasm of axilla and upper limb lymph nodes: Secondary | ICD-10-CM

## 2018-06-03 LAB — CMP (CANCER CENTER ONLY)
ALT: 12 U/L (ref 0–44)
AST: 18 U/L (ref 15–41)
Albumin: 4.4 g/dL (ref 3.5–5.0)
Alkaline Phosphatase: 86 U/L (ref 38–126)
Anion gap: 10 (ref 5–15)
BILIRUBIN TOTAL: 0.8 mg/dL (ref 0.3–1.2)
BUN: 10 mg/dL (ref 6–20)
CO2: 29 mmol/L (ref 22–32)
CREATININE: 0.69 mg/dL (ref 0.44–1.00)
Calcium: 10 mg/dL (ref 8.9–10.3)
Chloride: 101 mmol/L (ref 98–111)
GFR, Est AFR Am: >60 mL/min (ref >60)
Glucose, Bld: 99 mg/dL (ref 70–99)
Potassium: 3.9 mmol/L (ref 3.5–5.1)
Sodium: 140 mmol/L (ref 135–145)
TOTAL PROTEIN: 7.6 g/dL (ref 6.5–8.1)

## 2018-06-03 LAB — CBC WITH DIFFERENTIAL (CANCER CENTER ONLY)
BASOS ABS: 0 10*3/uL (ref 0.0–0.1)
Basophils Relative: 1 %
EOS ABS: 0 10*3/uL (ref 0.0–0.5)
EOS PCT: 1 %
HCT: 43 % (ref 34.8–46.6)
Hemoglobin: 14.7 g/dL (ref 11.6–15.9)
Lymphocytes Relative: 11 %
Lymphs Abs: 0.7 10*3/uL — ABNORMAL LOW (ref 0.9–3.3)
MCH: 33.1 pg (ref 25.1–34.0)
MCHC: 34.2 g/dL (ref 31.5–36.0)
MCV: 96.7 fL (ref 79.5–101.0)
Monocytes Absolute: 0.5 10*3/uL (ref 0.1–0.9)
Monocytes Relative: 8 %
Neutro Abs: 5.1 10*3/uL (ref 1.5–6.5)
Neutrophils Relative %: 79 %
PLATELETS: 204 10*3/uL (ref 145–400)
RBC: 4.44 MIL/uL (ref 3.70–5.45)
RDW: 14.2 % (ref 11.2–14.5)
WBC: 6.3 10*3/uL (ref 3.9–10.3)

## 2018-06-03 MED ORDER — INFLUENZA VAC SPLIT QUAD 0.5 ML IM SUSY
PREFILLED_SYRINGE | INTRAMUSCULAR | Status: AC
  Filled 2018-06-03: qty 0.5

## 2018-06-03 MED ORDER — INFLUENZA VAC SPLIT QUAD 0.5 ML IM SUSY
0.5000 mL | PREFILLED_SYRINGE | Freq: Once | INTRAMUSCULAR | Status: AC
  Administered 2018-06-03: 0.5 mL via INTRAMUSCULAR

## 2018-06-03 MED ORDER — LETROZOLE 2.5 MG PO TABS: 2.5000 mg | ORAL_TABLET | Freq: Every day | ORAL | 3 refills | Status: DC

## 2018-06-03 NOTE — Telephone Encounter (Signed)
Appts scheduled avs/calendar printed per 10/7 los °

## 2018-06-04 LAB — FOLLICLE STIMULATING HORMONE: FSH: 62 m[IU]/mL

## 2018-06-04 LAB — ESTRADIOL: Estradiol: <5 pg/mL

## 2018-06-04 NOTE — Progress Notes (Signed)
  Radiation Oncology         [phone] ________________________________  Name: Martha [name] MRN: [id number]  Date: 06/03/2018  DOB: 09-22-1960  End of Treatment Note  Diagnosis:   57 y.o. female with Malignant neoplasm of lower-inner quadrant of left breast in female, estrogen receptor positive ([place]) Staging form: Breast, AJCC 8th Edition - Clinical stage from 01/16/2018: Stage IA (cT1c, cN0, cM0, G2, ER+, PR+, HER2-) - Signed by Truitt Merle, MD on 01/23/2018 - Pathologic: Stage IA (pT1c, pN1, cM0, G1, ER+, PR+, HER2-) - Signed by Eppie Gibson, MD on 04/09/2018     Indication for treatment:  Curative       Radiation treatment dates:   04/22/2018 - 06/03/2018  Site/dose:    1. Left Breast / 50 Gy in 25 fractions 2. Left SCV, axilla nodes / 50 Gy in 25 fractions 3. Left Breast Boost / 10 Gy in 5 fractions  Beams/energy:    1. 3D / 6X Photon 2. 3D / 10X, 6X Photon 3. isodose / 6X Photon  Narrative: The patient tolerated radiation treatment relatively well.   She experienced mild fatigue and some expected skin irritation as she progressed through treatment. Her skin is dry and erythematous with resolving peeling in the SCV field and PAB field. She is applying Neosporin to the areas of peeling and Radiaplex everywhere else.  Plan: The patient has completed radiation treatment. The patient will return to radiation oncology clinic for routine followup in one month. I advised them to call or return sooner if they have any questions or concerns related to their recovery or treatment.  -----------------------------------  Eppie Gibson, MD  This document serves as a record of services personally performed by Eppie Gibson, MD. It was created on her behalf by Rae Lips, a trained medical scribe. The creation of this record is based on the scribe's personal observations and the provider's statements to them. This document has been checked and approved by the attending provider.

## 2018-06-05 ENCOUNTER — Telehealth: Payer: Self-pay

## 2018-06-05 NOTE — Telephone Encounter (Signed)
-----   Message from Truitt Merle, MD sent at 06/05/2018  6:41 AM EDT ----- Please let pt know her lab results, which indicates she is postmenopausal, and she can start letrozole I prescribed for her as we discussed, thanks   Truitt Merle  06/05/2018

## 2018-06-05 NOTE — Telephone Encounter (Signed)
Left voice message for patient per Dr. Burr Medico, lab results indicate that she is postmenopausal.  Instructed to start taking Letrozole as prescribed for her and as per their discussion.

## 2018-07-04 ENCOUNTER — Encounter: Payer: Self-pay | Admitting: Radiation Oncology

## 2018-07-04 NOTE — Progress Notes (Signed)
Ms. Fromer presents for follow up of radiation completed 06/03/18 to her Left Breast, SCV, left axilla. She saw Dr. Burr Medico on 06/03/18 and began taking Letrozole daily. She will see survivorship on 09/05/18 and Dr. Burr Medico on 12/02/18. Her skin has healed well. She has mild hyperpigmentation present. She does have itching to her left upper back area. She continues to use radiaplex. She will use a vitamin E containing lotion or oil when the radiaplex is completed.   BP (!) 144/74 (BP Location: Right Arm, Patient Position: Sitting)   Pulse 78   Temp 98.6 F (37 C) (Oral)   Resp 18   Ht 5' 2.5" (1.588 m)   Wt 123 lb (55.8 kg)   SpO2 100%   BMI 22.14 kg/m    Wt Readings from Last 3 Encounters:  07/12/18 123 lb (55.8 kg)  06/03/18 122 lb 9.6 oz (55.6 kg)  04/09/18 126 lb (57.2 kg)

## 2018-07-05 ENCOUNTER — Ambulatory Visit: Payer: Self-pay | Admitting: Radiation Oncology

## 2018-07-12 ENCOUNTER — Other Ambulatory Visit: Payer: Self-pay

## 2018-07-12 ENCOUNTER — Ambulatory Visit
Admission: RE | Admit: 2018-07-12 | Discharge: 2018-07-12 | Disposition: A | Discharge status: Home / Self Care | Payer: 59 | Source: Ambulatory Visit | Attending: Radiation Oncology | Admitting: Radiation Oncology

## 2018-07-12 ENCOUNTER — Encounter: Payer: Self-pay | Admitting: Radiation Oncology

## 2018-07-12 DIAGNOSIS — C50312 Malignant neoplasm of lower-inner quadrant of left female breast: Secondary | ICD-10-CM | POA: Insufficient documentation

## 2018-07-12 DIAGNOSIS — Z79899 Other long term (current) drug therapy: Secondary | ICD-10-CM | POA: Diagnosis not present

## 2018-07-12 DIAGNOSIS — Z923 Personal history of irradiation: Secondary | ICD-10-CM | POA: Insufficient documentation

## 2018-07-12 DIAGNOSIS — Z170 Estrogen receptor positive status [ER+]: Secondary | ICD-10-CM | POA: Diagnosis not present

## 2018-07-12 DIAGNOSIS — Z79811 Long term (current) use of aromatase inhibitors: Secondary | ICD-10-CM | POA: Diagnosis not present

## 2018-07-12 HISTORY — DX: Personal history of irradiation: Z92.3

## 2018-07-12 NOTE — Progress Notes (Signed)
  Radiation Oncology         [phone] ________________________________  Name: Martha [name] MRN: [id number]  Date: 07/12/2018  DOB: 1961/05/19  Follow-Up Visit Note  Outpatient  CC: Martha Barrack, MD  Martha Barrack, MD  Diagnosis and Prior Radiotherapy:    ICD-10-CM   1. Malignant neoplasm of lower-inner quadrant of left breast in female, estrogen receptor positive ([place]) C50.312    Z17.0     CHIEF COMPLAINT: Here for follow-up and surveillance of Left Breast cancer  Narrative:  The patient returns today for routine follow-up of radiation completed 06/03/18 to her Left Breast, SCV, left axilla. She saw Dr. Burr Medico on 06/03/18 and began taking Letrozole daily. She will see survivorship on 09/05/18 and Dr. Burr Medico on 12/02/18.                              ALLERGIES:  has No Known Allergies.  Meds: Current Outpatient Medications  Medication Sig Dispense Refill  . letrozole (FEMARA) 2.5 MG tablet Take 1 tablet (2.5 mg total) by mouth daily. 30 tablet 3  . levothyroxine (SYNTHROID, LEVOTHROID) 75 MCG tablet Take 1 tablet (75 mcg total) by mouth daily before breakfast. 90 tablet 0  . OVER THE COUNTER MEDICATION Take 1 tablet by mouth daily. Protandim Supplement    . PARoxetine (PAXIL) 20 MG tablet TAKE 1 TABLET BY MOUTH DAILY AS DIRECTED. 90 tablet 1  . Probiotic Product (PROBIOTIC PO) Take 1 capsule by mouth daily.     No current facility-administered medications for this encounter.     Physical Findings: The patient is in no acute distress. Patient is alert and oriented.  height is 5' 2.5" (1.588 m) and weight is 123 lb (55.8 kg). Her oral temperature is 98.6 F (37 C). Her blood pressure is 144/74 (abnormal) and her pulse is 78. Her respiration is 18 and oxygen saturation is 100%. .    Satisfactory skin healing in radiotherapy fields. Mildly hyperpigmented, still.   Lab Findings: Lab Results  Component Value Date   WBC 6.3 06/03/2018   HGB 14.7 06/03/2018   HCT 43.0  06/03/2018   MCV 96.7 06/03/2018   PLT 204 06/03/2018    Radiographic Findings: No results found.  Impression/Plan: Healing well from radiotherapy to the breast tissue.  Continue skin care with topical Vitamin E Oil and / or lotion for at least 2 more months for further healing.  I encouraged her to continue with yearly mammography as appropriate (for intact breast tissue) and followup with medical oncology. I will see her back on an as-needed basis. I have encouraged her to call if she has any issues or concerns in the future. I wished her the very best. _____________________________________   Martha Gibson, MD  This document serves as a record of services personally performed by Martha Gibson, MD. It was created on her behalf by Margit Banda, a trained medical scribe. The creation of this record is based on the scribe's personal observations and the provider's statements to them. This document has been checked and approved by the attending provider.

## 2018-07-31 ENCOUNTER — Ambulatory Visit
Admission: RE | Admit: 2018-07-31 | Discharge: 2018-07-31 | Disposition: A | Discharge status: Home / Self Care | Payer: 59 | Source: Ambulatory Visit | Attending: Hematology | Admitting: Hematology

## 2018-07-31 DIAGNOSIS — E2839 Other primary ovarian failure: Secondary | ICD-10-CM

## 2018-08-05 ENCOUNTER — Telehealth: Payer: Self-pay

## 2018-08-05 NOTE — Telephone Encounter (Signed)
-----   Message from Truitt Merle, MD sent at 08/05/2018  7:04 AM EST ----- Please let pt know the normal DEXA result, thanks  Truitt Merle  08/05/2018

## 2018-08-05 NOTE — Telephone Encounter (Signed)
Left voice message for patient that her bone density test was normal, per Dr. Burr Medico.

## 2018-08-22 ENCOUNTER — Other Ambulatory Visit: Payer: Self-pay | Admitting: Family Medicine

## 2018-08-29 ENCOUNTER — Telehealth: Payer: Self-pay | Admitting: Hematology

## 2018-08-29 NOTE — Telephone Encounter (Signed)
Called to cancel, will reschedule with Lacie in April

## 2018-09-05 ENCOUNTER — Encounter: Payer: 59 | Admitting: Adult Health

## 2018-09-15 ENCOUNTER — Other Ambulatory Visit: Payer: Self-pay | Admitting: Hematology

## 2018-09-30 ENCOUNTER — Other Ambulatory Visit: Payer: Self-pay | Admitting: Family Medicine

## 2018-09-30 MED ORDER — LEVOTHYROXINE SODIUM 75 MCG PO TABS: 75.0000 ug | ORAL_TABLET | Freq: Every day | ORAL | 0 refills | Status: DC

## 2018-11-28 ENCOUNTER — Telehealth: Payer: Self-pay | Admitting: Hematology

## 2018-11-28 NOTE — Telephone Encounter (Signed)
Per 4/2 schedule message cxd 4/6 lab and converted f/u to telephone visit. Patient aware.

## 2018-11-29 NOTE — Progress Notes (Signed)
[place]   [phone] [phone]   Clinic Follow up Note   Patient Care Team: [name], MD as PCP - General (Family Medicine) [name], MD as Consulting Physician (General Surgery) Truitt Merle, MD as Consulting Physician (Hematology) Eppie Gibson, MD as Attending Physician (Radiation Oncology)  I connected with [name] on 12/02/2018 at  3:30 PM EDT by telephone and verified that I am speaking with the correct person using two identifiers.   I discussed the limitations, risks, security and privacy concerns of performing an evaluation and management service by telephone and the availability of in person appointments. I also discussed with the patient that there may be a patient responsible charge related to this service. The patient expressed understanding and agreed to proceed.   Patient's location:   Work  Provider's location:  my office    CHIEF COMPLAINT: Malignant neoplasm of lower-inner quadrant of left breast   SUMMARY OF ONCOLOGIC HISTORY: Oncology History   Cancer Staging Malignant neoplasm of lower-inner quadrant of left breast in female, estrogen receptor positive ([place]) Staging form: Breast, AJCC 8th Edition - Clinical stage from 01/16/2018: Stage IA (cT1c, cN0, cM0, G2, ER+, PR+, HER2-) - Signed by Truitt Merle, MD on 01/23/2018       Malignant neoplasm of lower-inner quadrant of left breast in female, estrogen receptor positive ([place])   01/15/2018 Mammogram    Diagnositc Mammogram 01/15/18  IMPRESSION: 1. Suspicious 1.2 x 1.4 x 1.3 cm mixed echogenicity mass left breast 7 o'clock position retroareolar location at the site of palpable concern.. 2. Indeterminate Within the left breast 7:30 o'clock retroareolar location, adjacent to the palpable mass, is a 0.5 x 0.4 x 0.5 cm oval circumscribed hypoechoic mass. 3. Indeterminate calcifications within the lateral left breast. Location of these calcifications is not  definitely confirmed on the true lateral view.     01/16/2018 Initial Biopsy    Diagnosis 01/16/18 1. Breast, left, needle core biopsy, 7:30 o'clock (ribbon clip) - FIBROCYSTIC CHANGES WITH SCLEROSING ADENOSIS AND CALCIFICATIONS. - FIBROADENOMATOID CHANGE. - NO MALIGNANCY IDENTIFIED. 2. Breast, left, needle core biopsy, 7 o'clock position (coil clip) - INVASIVE MAMMARY CARCINOMA, MSBR GRADE I/II. - SEE MICROSCOPIC DESCRIPTION Microscopic Comment  ADDENDUM: Immunohistochemistry for E-Cadherin is strongly positive in the tumor consistent with ductal carcinoma. (JDP:ah 01/17/18)    01/16/2018 Receptors her2    Estrogen Receptor: 100%, POSITIVE, STRONG STAINING INTENSITY Progesterone Receptor: 50%, POSITIVE, STRONG STAINING INTENSITY Proliferation Marker Ki67: 20% HER2 Negative    01/16/2018 Cancer Staging    Staging form: Breast, AJCC 8th Edition - Clinical stage from 01/16/2018: Stage IA (cT1c, cN0, cM0, G2, ER+, PR+, HER2-) - Signed by Truitt Merle, MD on 01/23/2018    01/22/2018 Initial Diagnosis    Malignant neoplasm of lower-inner quadrant of left breast in female, estrogen receptor positive ([place])    02/21/2018 Surgery     LEFT BREAST LUMPECTOMY WITH AXILLARY LYMPH NODE BIOPSY by Dr. Barry Dienes  02/21/18    02/21/2018 Pathology Results    Diagnosis 02/21/18 1. Breast, lumpectomy, Left - INVASIVE DUCTAL CARCINOMA, GRADE I, 1.6 CM. - DUCTAL CARCINOMA IN SITU, INTERMEDIATE NUCLEAR GRADE. - ANTERIOR AND MEDIAL RESECTION MARGINS ARE POSITIVE FOR CARCINOMA. - NEGATIVE FOR LYMPHOVASCULAR OR PERINEURAL INVASION. - BACKGROUND BREAST TISSUE WITH FIBROCYSTIC CHANGE, INCLUDING SCLEROSING ADENOSIS. - BIOPSY SITE CHANGES. - SEE ONCOLOGY TABLE. 2. Lymph node, sentinel, biopsy, Left Axillary #1 - METASTATIC BREAST CARCINOMA TO A LYMPH NODE, 1.0 CM IN GREATEST DIMENSION, WITH EXTRANODAL EXTENSION (1/1).  3. Lymph node, sentinel, biopsy, Left Axillary #2 - LYMPH NODE, NEGATIVE FOR CARCINOMA (0/1).      02/21/2018 Miscellaneous    Mammaprint 02/21/18 Low Risk with 10-year risk of recurrnce at 10% -No potential signifcant chemotherapy benefit    03/20/2018 Pathology Results    RE-EXCISION OF BREAST LUMPECTOMY by Dr. Barry Dienes  Diagnosis 03/20/18 1. Breast, excision, Left new anterior margin - FIBROCYSTIC CHANGES WITH ADENOSIS AND CALCIFICATIONS. - HEALING BIOPSY SITE. - THERE IS NO EVIDENCE OF MALIGNANCY. 2. Breast, excision, Left new medial margin - FIBROCYSTIC CHANGES WITH ADENOSIS AND CALCIFICATIONS. - HEALING BIOPSY SITE. - THERE IS NO EVIDENCE OF MALIGNANCY. Microscopic Comment 1. -2. The surgical resection margin(s) of the specimen were inked and microscopically evaluated. (JBK:kh 03-22-18)    04/09/2018 Cancer Staging    Staging form: Breast, AJCC 8th Edition - Pathologic: Stage IA (pT1c, pN1, cM0, G1, ER+, PR+, HER2-) - Signed by Eppie Gibson, MD on 04/09/2018    04/22/2018 - 06/03/2018 Radiation Therapy    Radaiton with Dr. Isidore Moos 04/22/18-06/03/18    05/2018 -  Anti-estrogen oral therapy    Letrozole 2.'5mg'$  started 05/2018      CURRENT THERAPY:  Letrozole 2.'5mg'$  started in 05/2018  INTERVAL HISTORY:  [name] is here for a follow up of left breast cancer. She was last seen by me 6 months ago. She was able to identify herself by her birth date. She notes she is doing well. She notes she is taking letrozole and tolerating well and has no hot flashes with Prozac. She also denies joint pain or stiffness or change in her weight. She notes she is very active.  She plans to see Dr. Barry Dienes next week, but may need to post-pone it. Pt does not like her breast incision after second surgery. She has a "flap" over her nipple. She also notes scar tissue throughout the breast.  She notes she still only goes to work, but can work to home from time to time.    REVIEW OF SYSTEMS:   Constitutional: Denies fevers, chills or abnormal weight loss Eyes: Denies blurriness of vision Ears,  nose, mouth, throat, and face: Denies mucositis or sore throat Respiratory: Denies cough, dyspnea or wheezes Cardiovascular: Denies palpitation, chest discomfort or lower extremity swelling Gastrointestinal:  Denies nausea, heartburn or change in bowel habits Skin: Denies abnormal skin rashes Lymphatics: Denies new lymphadenopathy or easy bruising Neurological:Denies numbness, tingling or new weaknesses Behavioral/Psych: Mood is stable, no new changes  BREAST: (+) Left breast scar tissue and skin change post surgery  All other systems were reviewed with the patient and are negative.  MEDICAL HISTORY:  Past Medical History:  Diagnosis Date  . Dysrhythmia    SVT, s/p ablation ~ 2012 in at [facility]  . History of radiation therapy 04/22/18-06/03/18   Left Breast, left SCV, axilla 50 Gy in 25 fractions, Left breast boost 10 Gy in 5 fractions.   Marland Kitchen PONV (postoperative nausea and vomiting)   . Thyroid disease     SURGICAL HISTORY: Past Surgical History:  Procedure Laterality Date  . APPENDECTOMY    . BREAST BIOPSY Right 2014   fibroadenoma  . BREAST EXCISIONAL BIOPSY Left 1990   benign  . BREAST LUMPECTOMY WITH AXILLARY LYMPH NODE BIOPSY Left 02/21/2018   Procedure: LEFT BREAST LUMPECTOMY WITH AXILLARY LYMPH NODE BIOPSY;  Surgeon: [name], MD;  Location: [facility];  Service: General;  Laterality: Left;  . BREAST SURGERY    . EYE SURGERY    .  RE-EXCISION OF BREAST LUMPECTOMY Left 03/20/2018   Procedure: RE-EXCISION OF BREAST LUMPECTOMY;  Surgeon: [name], MD;  Location: [facility];  Service: General;  Laterality: Left;    I have reviewed the social history and family history with the patient and they are unchanged from previous note.  ALLERGIES:  has No Known Allergies.  MEDICATIONS:  Current Outpatient Medications  Medication Sig Dispense Refill  . letrozole (FEMARA) 2.5 MG tablet TAKE 1 TABLET BY MOUTH EVERY DAY 90 tablet 1  . levothyroxine  (SYNTHROID, LEVOTHROID) 75 MCG tablet Take 1 tablet (75 mcg total) by mouth daily before breakfast. 90 tablet 0  . OVER THE COUNTER MEDICATION Take 1 tablet by mouth daily. Protandim Supplement    . PARoxetine (PAXIL) 20 MG tablet TAKE 1 TABLET BY MOUTH DAILY AS DIRECTED. 90 tablet 1  . Probiotic Product (PROBIOTIC PO) Take 1 capsule by mouth daily.     No current facility-administered medications for this visit.     PHYSICAL EXAMINATION: ECOG PERFORMANCE STATUS: 0 - Asymptomatic  Exam not performed   LABORATORY DATA:  I have reviewed the data as listed CBC Latest Ref Rng & Units 06/03/2018 02/14/2018 01/23/2018  WBC 3.9 - 10.3 K/uL 6.3 5.4 10.7(H)  Hemoglobin 11.6 - 15.9 g/dL 14.7 14.6 14.5  Hematocrit 34.8 - 46.6 % 43.0 45.0 43.5  Platelets 145 - 400 K/uL 204 190 209     CMP Latest Ref Rng & Units 06/03/2018 02/14/2018 01/23/2018  Glucose 70 - 99 mg/dL 99 93 109  BUN 6 - 20 mg/dL 10 <5(L) 15  Creatinine 0.44 - 1.00 mg/dL 0.69 0.60 0.80  Sodium 135 - 145 mmol/L 140 142 140  Potassium 3.5 - 5.1 mmol/L 3.9 4.2 5.2(H)  Chloride 98 - 111 mmol/L 101 107 104  CO2 22 - 32 mmol/L _0 Calcium 8.9 - 10.3 mg/dL 10.0 9.4 9.8  Total Protein 6.5 - 8.1 g/dL 7.6 - 7.4  Total Bilirubin 0.3 - 1.2 mg/dL 0.8 - 0.7  Alkaline Phos 38 - 126 U/L 86 - 89  AST 15 - 41 U/L 18 - 19  ALT 0 - 44 U/L 12 - 17      RADIOGRAPHIC STUDIES: I have personally reviewed the radiological images as listed and agreed with the findings in the report. No results found.   ASSESSMENT & PLAN:  Martha [name] is a 58 y.o. female with   1. Malignant neoplasm of lower-inner quadrant of left breast, invasive ductal carcinoma, stage IA, pT1cN1M0, ER/PR Positive, HER2 Negative, Grade I, Mammaprint low risk  -She was diagnosed in 12/2017. She is s/p left breast lumpectomy and adjuvant radiation.  -She started anti-estrogen therapy with letrozole on 05/2018. Tolerating well with no issues. I encouraged her to watch her  diet and stay active to maintain weight. She is doing good on that  -She has no hot flashes when she is on Prozac. She will continue.  -She is clinically doing very well. There is no clinical concern for recurrence.  -She is due for mammogram in 12/2018 -Continue Letrozole for 5-7 years   -I offered her the chance to attend survivorship clinic. She is interested.  -f/u with Dr. Barry Dienes in 3 months and f/u with survivorship clinic in 6 months   2. Smoking Cessation, Alcohol Use  -She has been smoking for 40 years 1 pack a day. She is eligible for lung cancer screenings. She is interested.  -She has set a date to quit smoking. I encouraged  her to continue to work at this.  -I previously recommended she cut down on her alcohol intake. She is willing to do this.   3.  Hypothyroidism -New medication and follow-up with PCP  4. Bone Health  -DEXA from 07/2018 was normal with lowest T-score of -0.8 at left hip.  -I discussed the result with her, and encourage her to take VitD -we discussed that letrozole may cause osteopenia and osteoporosis    PLAN:  -She is clinically doing well.  -Continue letrozole  -Mammogram in 12/2018 at [facility], ordered today   -Lab and survivorship clinic with Lacie in 6 months  -I will send note to Dr. Barry Dienes    No problem-specific Assessment & Plan notes found for this encounter.   Orders Placed This Encounter  Procedures  . MM DIAG BREAST TOMO BILATERAL    Standing Status:   Future    Standing Expiration Date:   12/02/2019    Order Specific Question:   Reason for Exam (SYMPTOM  OR DIAGNOSIS REQUIRED)    Answer:   screening    Order Specific Question:   Is the patient pregnant?    Answer:   No    Order Specific Question:   Preferred imaging location?    Answer:   [facility]   All questions were answered. The patient knows to call the clinic with any problems, questions or concerns. No barriers to learning was detected.  I spent 10 minutes  counseling the patient on the phone. The total time spent in the appointment was 15 minutes.     Truitt Merle, MD 12/02/2018   I, Joslyn Devon, am acting as scribe for Truitt Merle, MD.   I have reviewed the above documentation for accuracy and completeness, and I agree with the above.

## 2018-12-02 ENCOUNTER — Encounter: Payer: Self-pay | Admitting: Hematology

## 2018-12-02 ENCOUNTER — Other Ambulatory Visit: Payer: 59

## 2018-12-02 ENCOUNTER — Inpatient Hospital Stay: Payer: 59 | Attending: Hematology | Admitting: Hematology

## 2018-12-02 DIAGNOSIS — Z170 Estrogen receptor positive status [ER+]: Secondary | ICD-10-CM | POA: Diagnosis not present

## 2018-12-02 DIAGNOSIS — C50312 Malignant neoplasm of lower-inner quadrant of left female breast: Secondary | ICD-10-CM

## 2018-12-03 ENCOUNTER — Telehealth: Payer: Self-pay | Admitting: Hematology

## 2018-12-03 NOTE — Telephone Encounter (Signed)
Scheduled appt per 4/6 los 

## 2019-01-17 ENCOUNTER — Ambulatory Visit
Admission: RE | Admit: 2019-01-17 | Discharge: 2019-01-17 | Disposition: A | Discharge status: Home / Self Care | Payer: 59 | Source: Ambulatory Visit | Attending: Hematology | Admitting: Hematology

## 2019-01-17 ENCOUNTER — Other Ambulatory Visit: Payer: Self-pay

## 2019-01-17 DIAGNOSIS — Z170 Estrogen receptor positive status [ER+]: Secondary | ICD-10-CM

## 2019-01-17 DIAGNOSIS — C50312 Malignant neoplasm of lower-inner quadrant of left female breast: Secondary | ICD-10-CM

## 2019-01-17 HISTORY — DX: Personal history of irradiation: Z92.3

## 2019-01-17 IMAGING — MG DIGITAL DIAGNOSTIC BILATERAL MAMMOGRAM WITH TOMO AND CAD
9 series · 9 of 25 positions shown · non-contrast
Comparison: Previous exam(s).

CLINICAL DATA: Annual examination. History of left breast cancer
diagnosed in [BA]. This is the patient's first postoperative
mammogram.

EXAM:
DIGITAL DIAGNOSTIC BILATERAL MAMMOGRAM WITH CAD AND TOMO

[L CC]
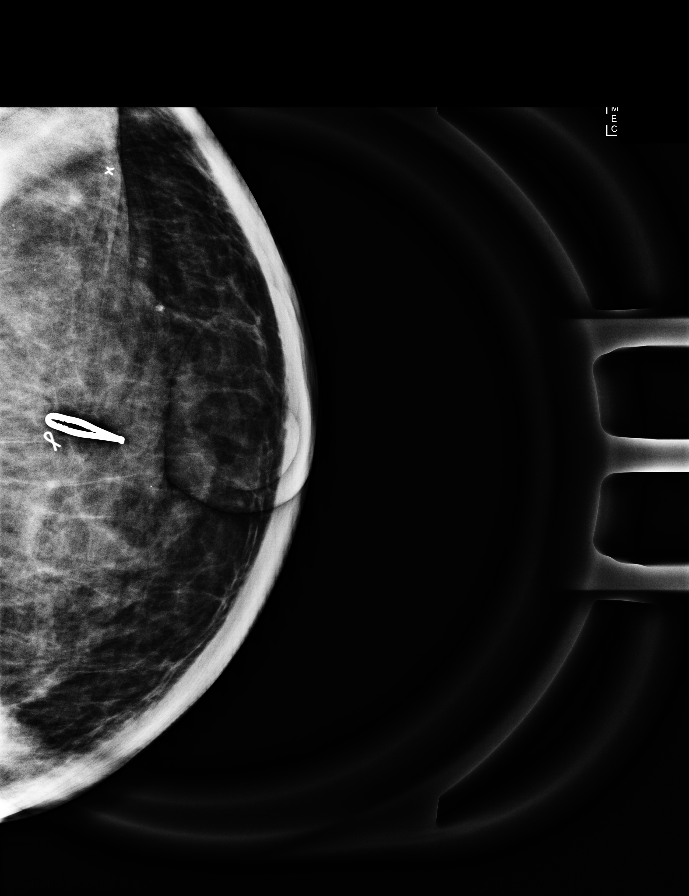

[L MLO synth-2D]
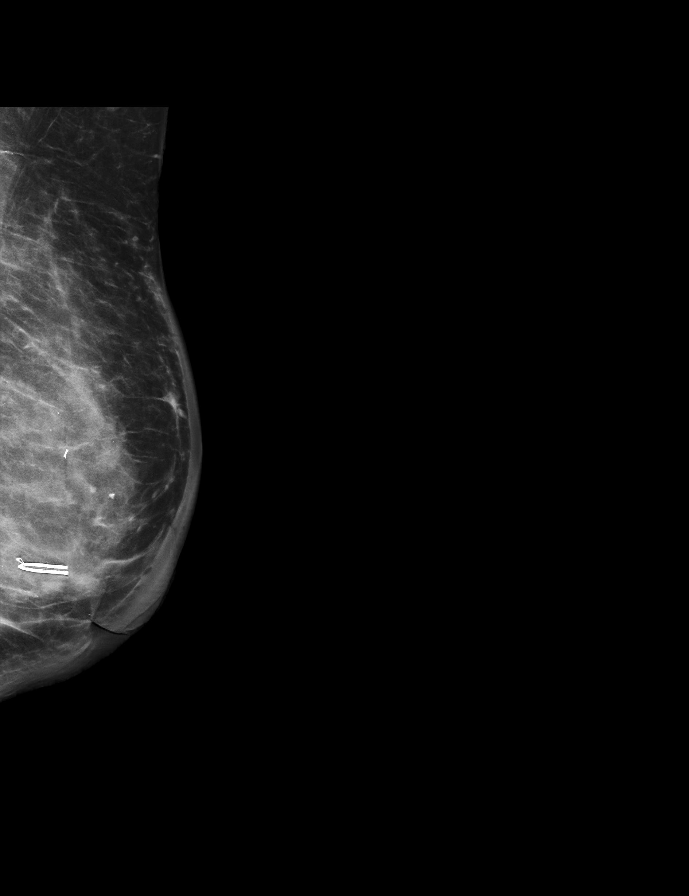

[R MLO synth-2D]
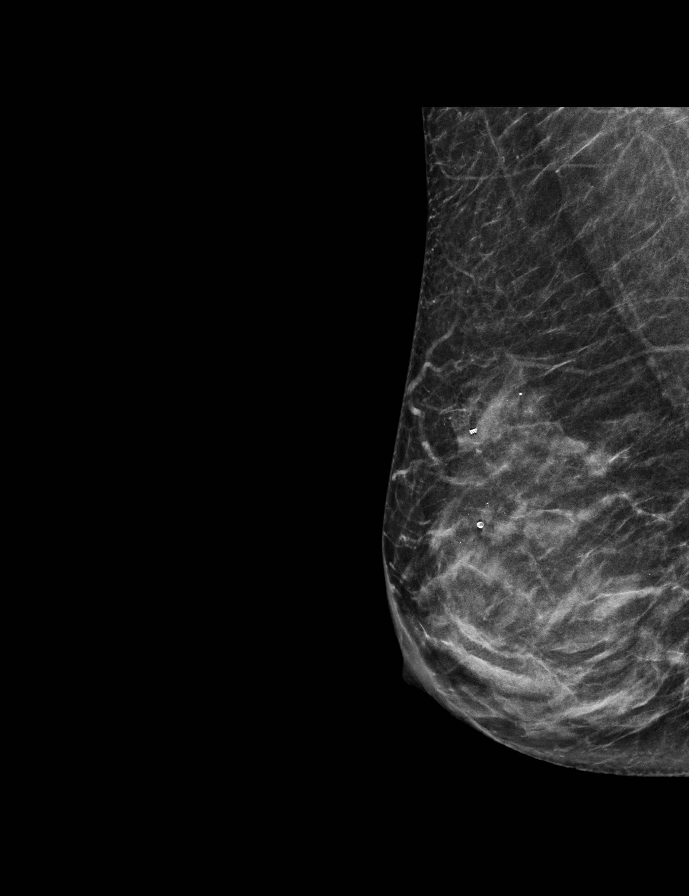

[R CC synth-2D]
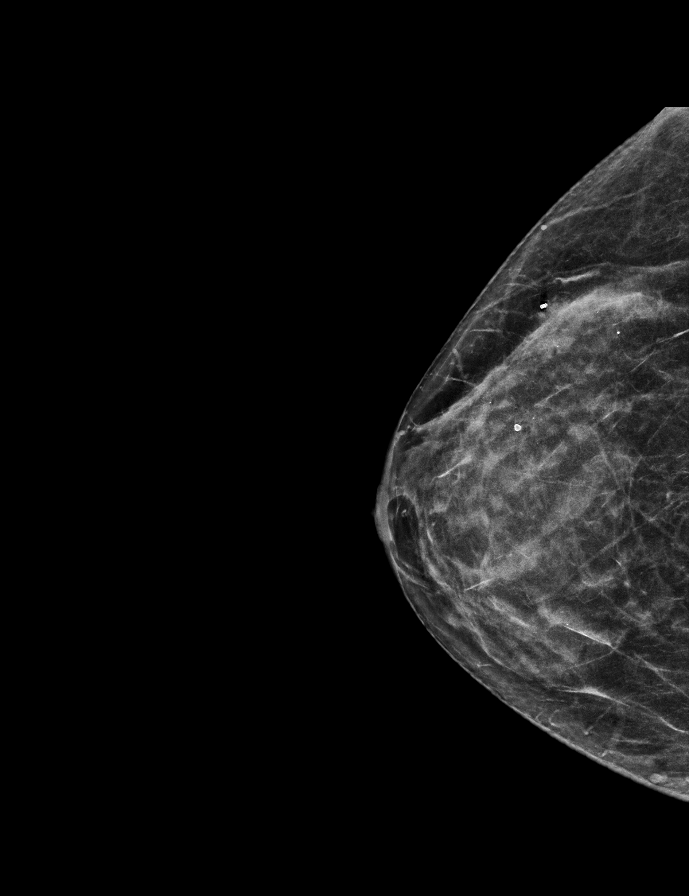

[L CC synth-2D]
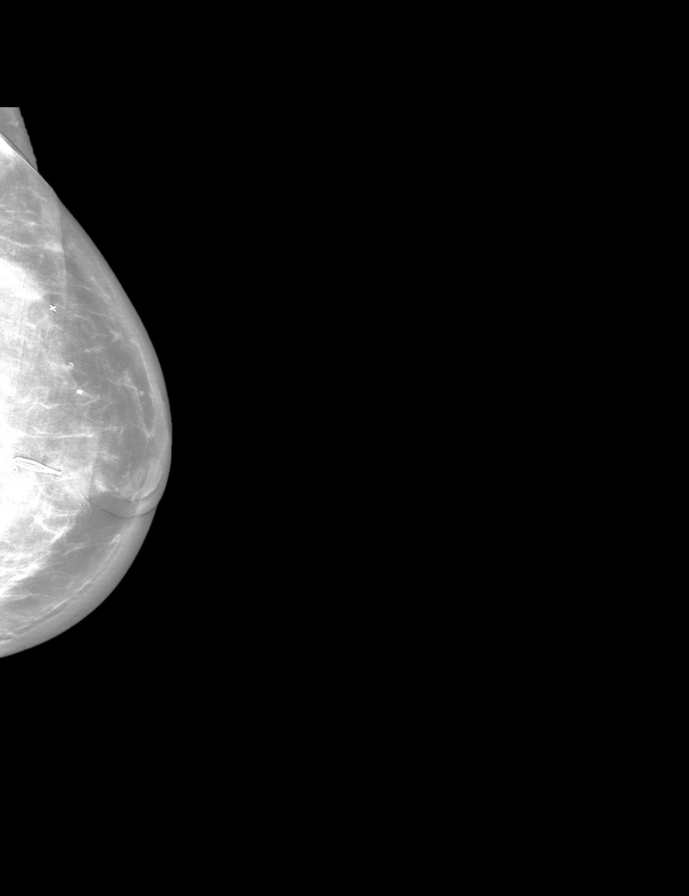

[R CC tomo · tomo slice 27/54.0]
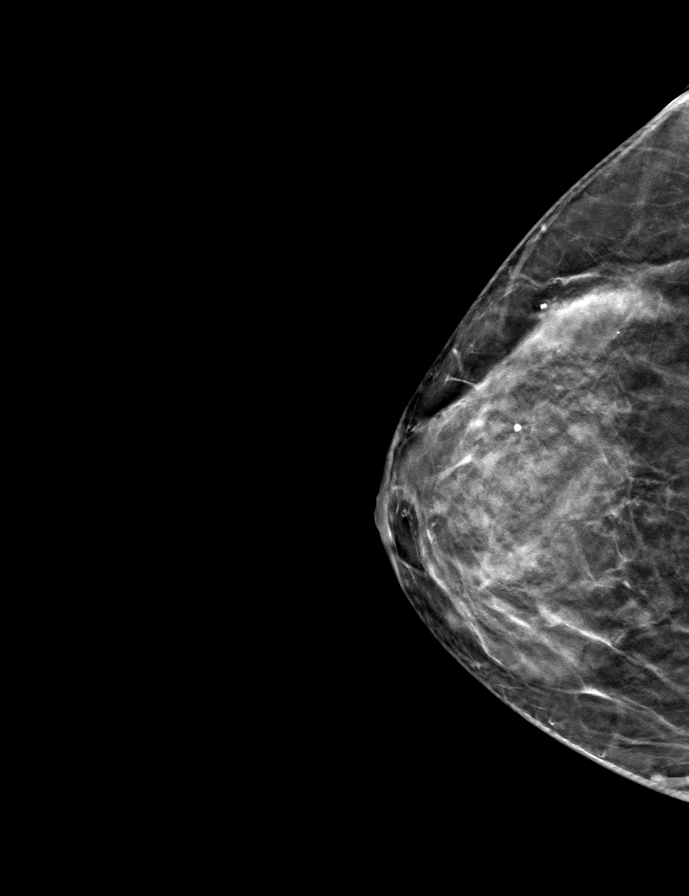

[R MLO tomo · tomo slice 27/52.0]
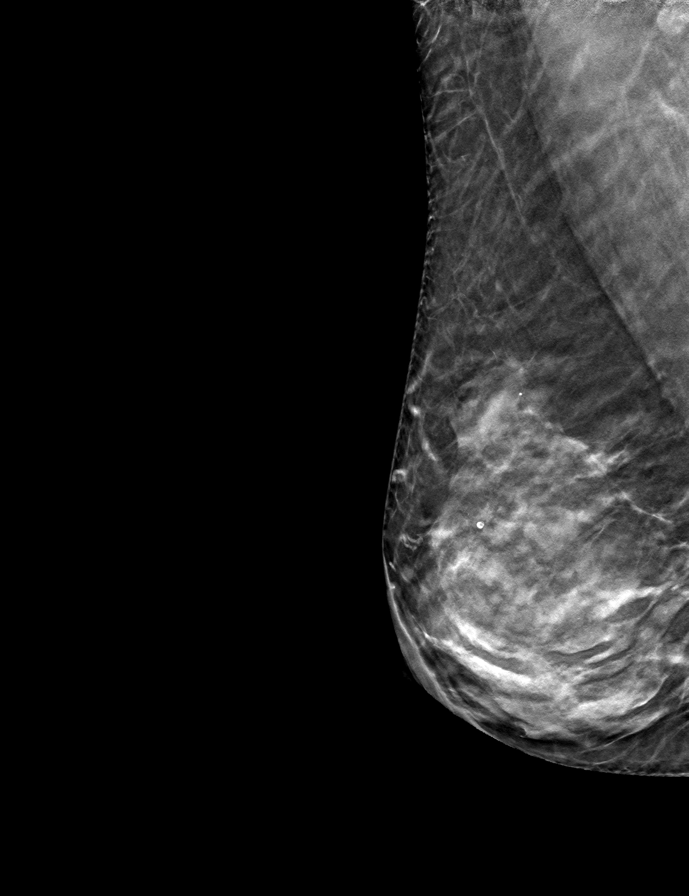

[L CC tomo · tomo slice 45/90.0]
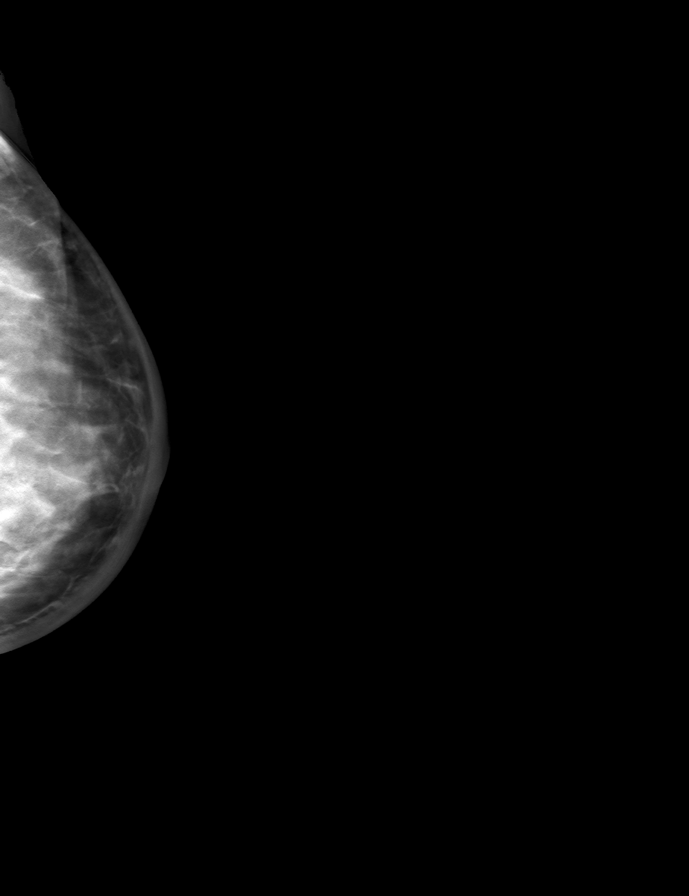

[L MLO tomo · tomo slice 39/77.0]
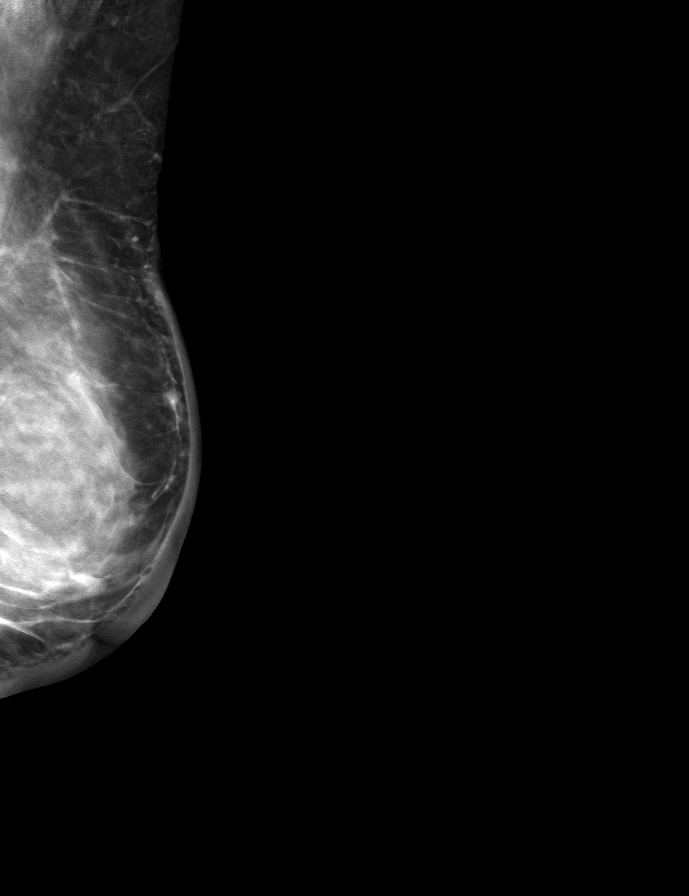

[9 of 25 positions shown; findings below may reference images not displayed]

ACR Breast Density Category c: The breast tissue is heterogeneously
dense, which may obscure small masses.
FINDINGS: Skin thickening and increased trabeculation of the left breast,
compatible with radiation change. There are postsurgical changes in
the inferior retroareolar left breast. Two biopsy clips in the left
breast are at the sites of prior benign biopsies. No mass,
suspicious microcalcification, or nonsurgical distortion is
identified in either breast to suggest malignancy.

Mammographic images were processed with CAD.
IMPRESSION: No evidence of malignancy in either breast. Lumpectomy and radiation
changes on the left.

RECOMMENDATION:
Diagnostic mammogram is suggested in 1 year. (Code:[BA])

I have discussed the findings and recommendations with the patient.
Results were also provided in writing at the conclusion of the
visit. If applicable, a reminder letter will be sent to the patient
regarding the next appointment.

BI-RADS CATEGORY  2: Benign.

## 2019-01-23 ENCOUNTER — Other Ambulatory Visit: Payer: Self-pay | Admitting: Family Medicine

## 2019-02-02 ENCOUNTER — Other Ambulatory Visit: Payer: Self-pay | Admitting: Family Medicine

## 2019-02-03 ENCOUNTER — Encounter: Payer: Self-pay | Admitting: Family Medicine

## 2019-02-04 ENCOUNTER — Ambulatory Visit (INDEPENDENT_AMBULATORY_CARE_PROVIDER_SITE_OTHER): Payer: 59 | Admitting: Family Medicine

## 2019-02-04 ENCOUNTER — Encounter: Payer: Self-pay | Admitting: Family Medicine

## 2019-02-04 DIAGNOSIS — C50312 Malignant neoplasm of lower-inner quadrant of left female breast: Secondary | ICD-10-CM | POA: Diagnosis not present

## 2019-02-04 DIAGNOSIS — N951 Menopausal and female climacteric states: Secondary | ICD-10-CM | POA: Diagnosis not present

## 2019-02-04 DIAGNOSIS — E039 Hypothyroidism, unspecified: Secondary | ICD-10-CM

## 2019-02-04 DIAGNOSIS — R21 Rash and other nonspecific skin eruption: Secondary | ICD-10-CM

## 2019-02-04 DIAGNOSIS — Z170 Estrogen receptor positive status [ER+]: Secondary | ICD-10-CM

## 2019-02-04 MED ORDER — PAROXETINE HCL 20 MG PO TABS: 20.0000 mg | ORAL_TABLET | Freq: Every day | ORAL | 3 refills | Status: DC

## 2019-02-04 MED ORDER — TRIAMCINOLONE ACETONIDE 0.5 % EX OINT: 1.0000 "application " | TOPICAL_OINTMENT | Freq: Two times a day (BID) | CUTANEOUS | 0 refills | Status: DC

## 2019-02-04 NOTE — Assessment & Plan Note (Signed)
Continue management per oncology. 

## 2019-02-04 NOTE — Assessment & Plan Note (Signed)
Continue Paxil 20 mg daily

## 2019-02-04 NOTE — Assessment & Plan Note (Signed)
Stable.  Continue Synthroid 75 mcg daily.  Advised her to come in for a follow-up visit.  Check TSH with blood draw.

## 2019-02-04 NOTE — Progress Notes (Signed)
    Chief Complaint:  Martha [name] is a 58 y.o. female who presents today for a virtual office visit with a chief complaint of menopausal symptoms.   Assessment/Plan:  Rash Possibly eczema, though difficult to tell based on video feed. Will start triamcinolone.  Menopausal symptom Continue Paxil 20 mg daily.  Malignant neoplasm of lower-inner quadrant of left breast in female, estrogen receptor positive ([place]) Continue management per oncology.   Hypothyroidism Stable.  Continue Synthroid 75 mcg daily.  Advised her to come in for a follow-up visit.  Check TSH with blood draw.    Subjective:  HPI:  Rash Started about 6 months ago.  Located on right upper extremity.  She has noticed some roughness to the area.  Little bit of redness.  Area is very itchy.  She has tried hydrocortisone cream, lotion, and [place] with modest improvement.  No other areas of rash.  No fevers or chills.  Her stable, chronic medical conditions are outlined below:  # Hypothyroidism - On Synthroid 41mcg daily and tolerating well  # Menopausal Symptoms - On paxil 20mg  daily as needed and tolerating well  % Breast Cancer - Follows with oncology - No signs of recurrence - On letrozole  ROS: Per HPI  PMH: She reports that she has been smoking. She has a 40.00 pack-year smoking history. She has never used smokeless tobacco. She reports current alcohol use of about 30.0 standard drinks of alcohol per week. She reports that she does not use drugs.      Objective/Observations  Physical Exam: Gen: NAD, resting comfortably Pulm: Normal work of breathing Neuro: Grossly normal, moves all extremities Psych: Normal affect and thought content Skin: Rough appearing erythematous rash on right forearm.   Virtual Visit via Video   I connected with [name] on 02/04/19 at  3:20 PM EDT by a video enabled telemedicine application and verified that I am speaking with the correct person using two  identifiers. I discussed the limitations of evaluation and management by telemedicine and the availability of in person appointments. The patient expressed understanding and agreed to proceed.   Patient location: Home Provider location: [place] participating in the virtual visit: Myself and Patient     [name]. [name], MD 02/04/2019 3:48 PM

## 2019-03-16 ENCOUNTER — Other Ambulatory Visit: Payer: Self-pay | Admitting: Hematology

## 2019-03-19 ENCOUNTER — Other Ambulatory Visit: Payer: Self-pay

## 2019-03-19 MED ORDER — LEVOTHYROXINE SODIUM 75 MCG PO TABS: 75.0000 ug | ORAL_TABLET | Freq: Every day | ORAL | 0 refills | Status: DC

## 2019-05-12 ENCOUNTER — Telehealth: Payer: Self-pay | Admitting: Hematology

## 2019-05-12 NOTE — Telephone Encounter (Signed)
LB PAL 10/7. Moved lab/SCP visit from 10/7 to 10/8. Left message. Schedule mailed.

## 2019-06-04 ENCOUNTER — Telehealth: Payer: Self-pay | Admitting: Nurse Practitioner

## 2019-06-04 ENCOUNTER — Encounter: Payer: Self-pay | Admitting: Hematology

## 2019-06-04 ENCOUNTER — Other Ambulatory Visit: Payer: 59

## 2019-06-04 ENCOUNTER — Encounter: Payer: 59 | Admitting: Nurse Practitioner

## 2019-06-04 NOTE — Telephone Encounter (Signed)
Returned patient's phone call regarding rescheduling 10/08 appointments, per scheduled message survivorship care plan visit has been cancelled. Left a voicemail.

## 2019-06-05 ENCOUNTER — Inpatient Hospital Stay: Payer: 59 | Attending: Family Medicine

## 2019-06-05 ENCOUNTER — Inpatient Hospital Stay: Payer: 59 | Admitting: Nurse Practitioner

## 2019-06-11 ENCOUNTER — Other Ambulatory Visit: Payer: Self-pay | Admitting: Family Medicine

## 2019-06-20 ENCOUNTER — Encounter: Payer: Self-pay | Admitting: Family Medicine

## 2019-06-20 ENCOUNTER — Ambulatory Visit (INDEPENDENT_AMBULATORY_CARE_PROVIDER_SITE_OTHER): Payer: 59

## 2019-06-20 ENCOUNTER — Other Ambulatory Visit: Payer: Self-pay

## 2019-06-20 DIAGNOSIS — Z23 Encounter for immunization: Secondary | ICD-10-CM

## 2019-08-28 ENCOUNTER — Encounter: Payer: Self-pay | Admitting: Family Medicine

## 2019-08-29 DIAGNOSIS — C801 Malignant (primary) neoplasm, unspecified: Secondary | ICD-10-CM

## 2019-08-29 HISTORY — DX: Malignant (primary) neoplasm, unspecified: C80.1

## 2019-09-06 ENCOUNTER — Other Ambulatory Visit: Payer: Self-pay | Admitting: Hematology

## 2019-09-22 ENCOUNTER — Other Ambulatory Visit: Payer: Self-pay

## 2019-09-22 MED ORDER — LEVOTHYROXINE SODIUM 75 MCG PO TABS: 75.0000 ug | ORAL_TABLET | Freq: Every day | ORAL | 1 refills | Status: DC

## 2019-12-30 ENCOUNTER — Other Ambulatory Visit: Payer: Self-pay | Admitting: Hematology

## 2019-12-30 DIAGNOSIS — Z853 Personal history of malignant neoplasm of breast: Secondary | ICD-10-CM

## 2020-01-20 ENCOUNTER — Ambulatory Visit
Admission: RE | Admit: 2020-01-20 | Discharge: 2020-01-20 | Disposition: A | Discharge status: Home / Self Care | Payer: Commercial Managed Care - PPO | Source: Ambulatory Visit | Attending: Hematology | Admitting: Hematology

## 2020-01-20 ENCOUNTER — Other Ambulatory Visit: Payer: Self-pay

## 2020-01-20 ENCOUNTER — Other Ambulatory Visit: Payer: Self-pay | Admitting: Hematology

## 2020-01-20 DIAGNOSIS — R921 Mammographic calcification found on diagnostic imaging of breast: Secondary | ICD-10-CM

## 2020-01-20 DIAGNOSIS — Z853 Personal history of malignant neoplasm of breast: Secondary | ICD-10-CM

## 2020-01-20 IMAGING — MG DIGITAL DIAGNOSTIC BILAT W/ TOMO W/ CAD
8 of 12 series · 8 of 28 positions shown · non-contrast
Comparison: Previous exam(s).

CLINICAL DATA: Patient presents for a bilateral diagnostic
examination due to a history of a previous left malignant lumpectomy
[C5].History of previous biopsy of right breast fibroadenoma [C5].

EXAM:
DIGITAL DIAGNOSTIC BILATERAL MAMMOGRAM WITH CAD AND TOMO

[L CC]
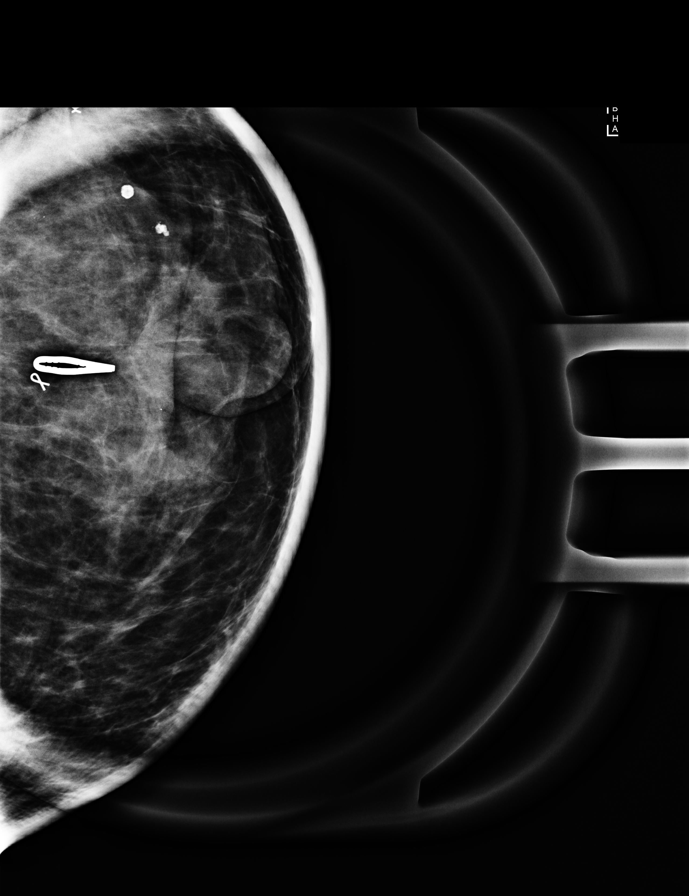

[R ML (1 of 2)]
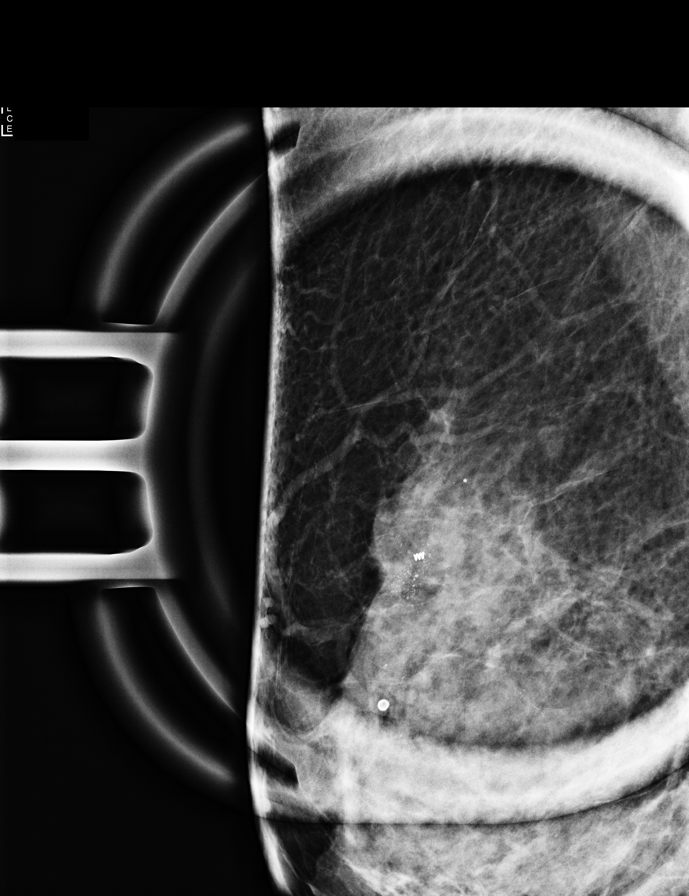

[R CC]
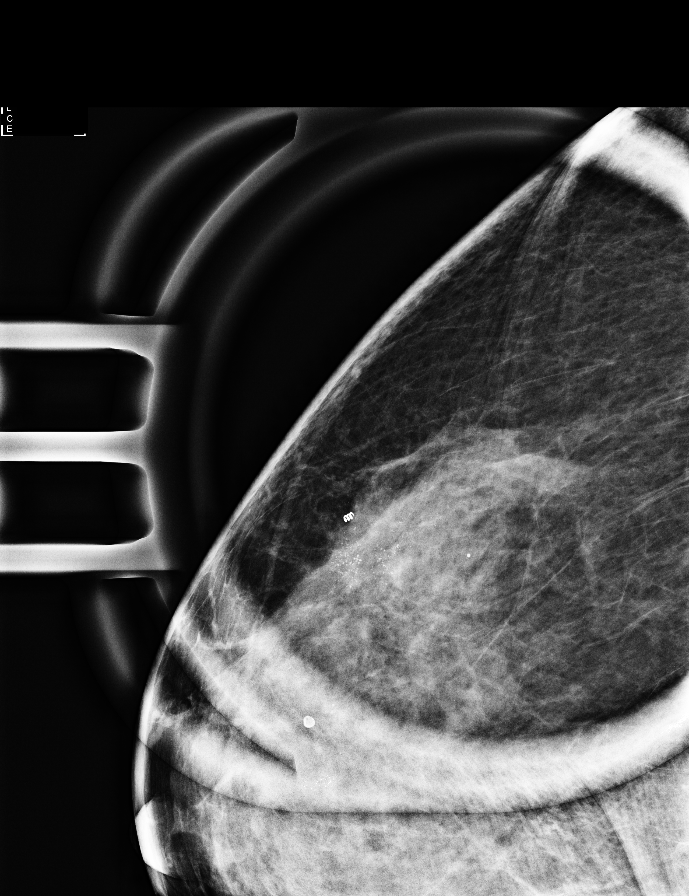

[R ML (2 of 2)]
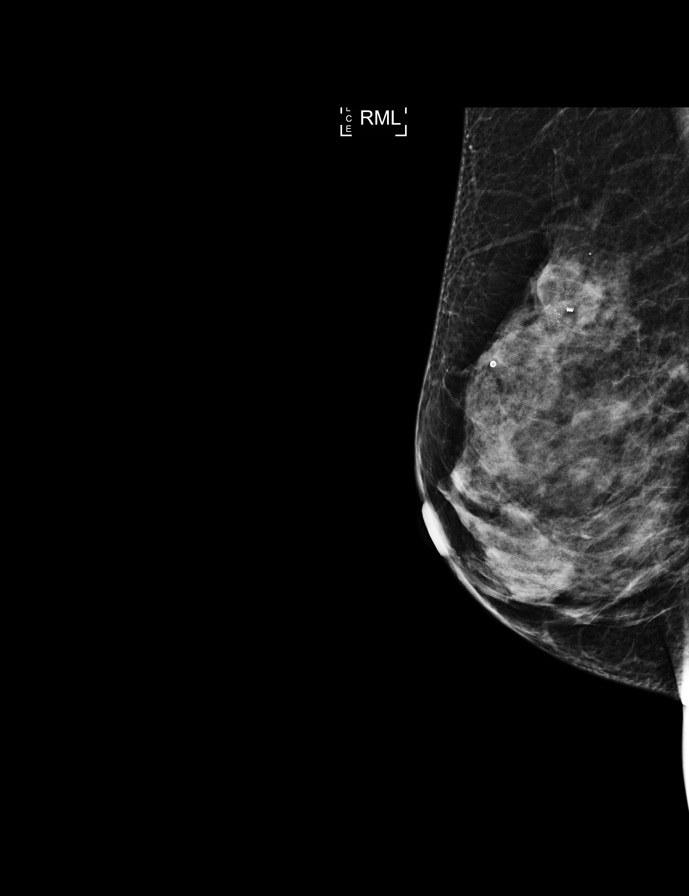

[R MLO synth-2D]
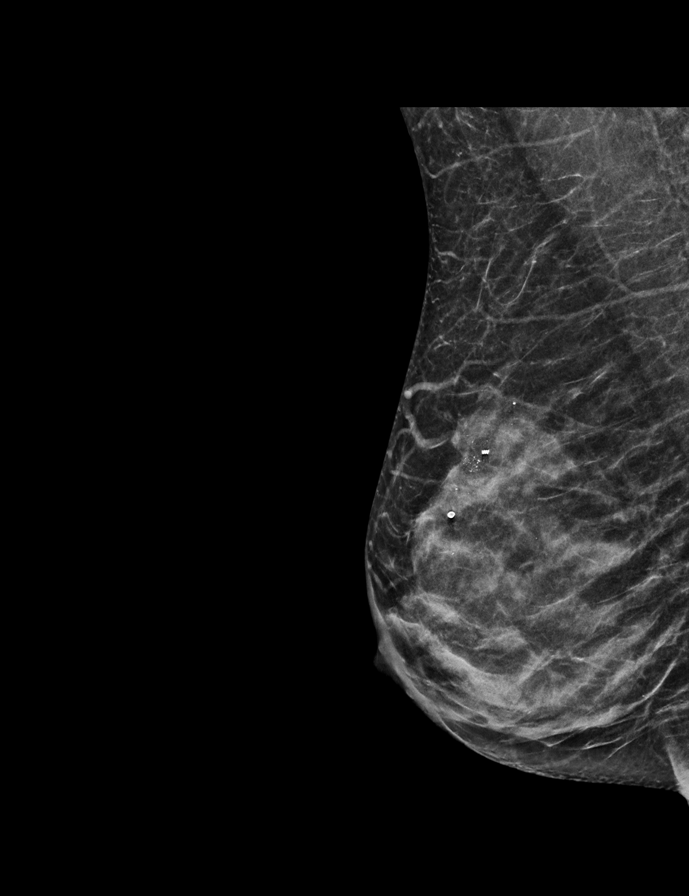

[L CC synth-2D]
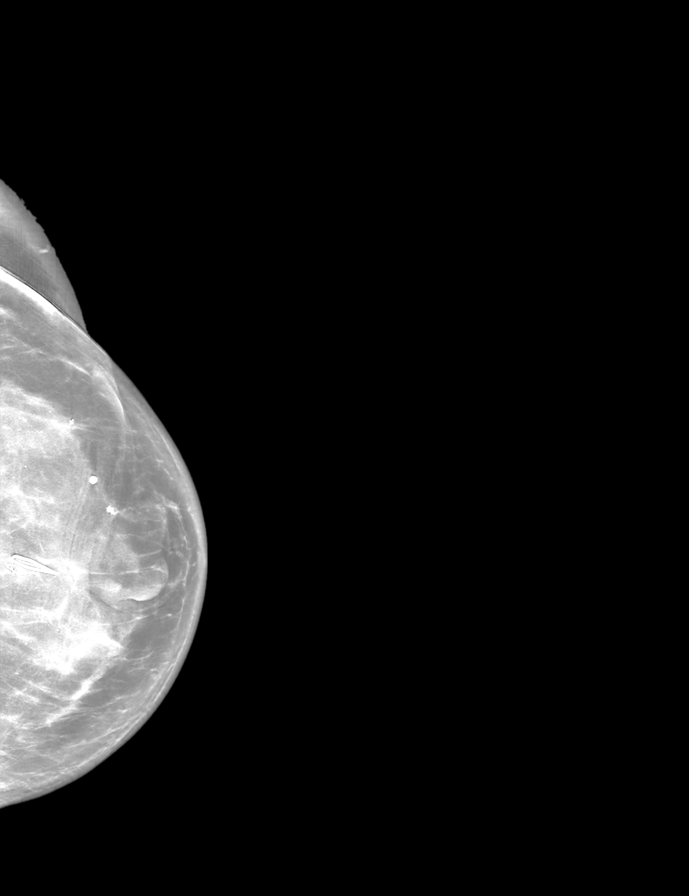

[L MLO synth-2D]
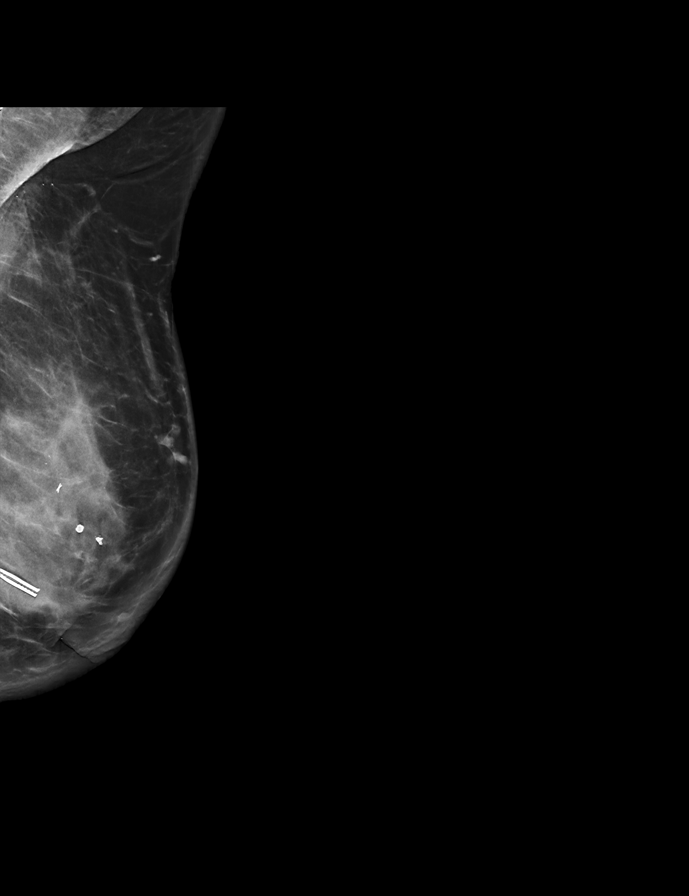

[R CC synth-2D]
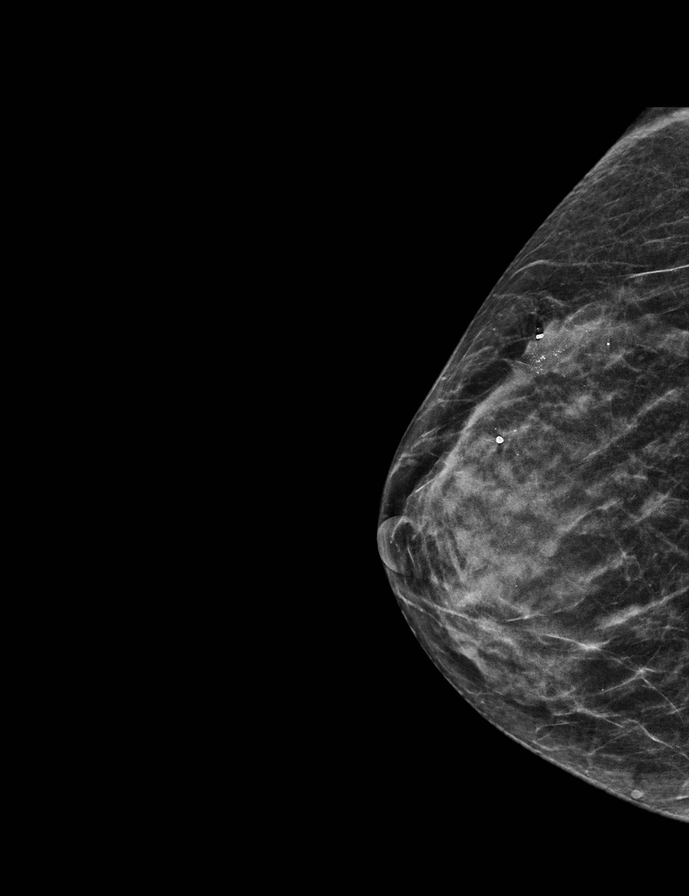

[8 of 28 positions shown; findings below may reference images not displayed]

ACR Breast Density Category c: The breast tissue is heterogeneously
dense, which may obscure small masses.
FINDINGS: Examination demonstrates stable post lumpectomy changes of the left
breast. Remainder of the left breast is unchanged. Images of the
right breast demonstrate a group of pleomorphic microcalcifications
over the middle to posterior third of the upper-outer quadrant
spanning approximately 3.6 cm. Remainder of the right breast is
unchanged.

Mammographic images were processed with CAD.
IMPRESSION: 1.  Stable post lumpectomy changes of the left breast.

2. Suspicious microcalcifications over the right upper outer
quadrant spanning 3.6 cm.

RECOMMENDATION:
Recommend stereotactic core needle biopsy of the suspicious right
breast microcalcifications.

I have discussed the findings and recommendations with the patient.
If applicable, a reminder letter will be sent to the patient
regarding the next appointment.

BI-RADS CATEGORY  4: Suspicious.

Biopsy was scheduled here at the [REDACTED] prior
to patient's departure.

## 2020-01-21 ENCOUNTER — Encounter: Payer: Self-pay | Admitting: Family Medicine

## 2020-01-21 NOTE — Telephone Encounter (Signed)
Pt has Bx appointment with [facility] on 02/02/2020 Should Pt wait until after Bx for Covid Vaccine  Please advise

## 2020-01-22 ENCOUNTER — Other Ambulatory Visit: Payer: Self-pay | Admitting: Family Medicine

## 2020-02-02 ENCOUNTER — Other Ambulatory Visit: Payer: Self-pay

## 2020-02-02 ENCOUNTER — Ambulatory Visit
Admission: RE | Admit: 2020-02-02 | Discharge: 2020-02-02 | Disposition: A | Discharge status: Home / Self Care | Payer: Commercial Managed Care - PPO | Source: Ambulatory Visit | Attending: Hematology | Admitting: Hematology

## 2020-02-02 DIAGNOSIS — R921 Mammographic calcification found on diagnostic imaging of breast: Secondary | ICD-10-CM

## 2020-02-02 HISTORY — PX: BREAST EXCISIONAL BIOPSY: SUR124

## 2020-02-02 HISTORY — PX: BREAST BIOPSY: SHX20

## 2020-02-02 IMAGING — MG MM BREAST LOCALIZATION CLIP
4 series · 4 of 12 positions shown · non-contrast
Comparison: Previous exam(s).

CLINICAL DATA: Status post stereotactic guided core biopsy of RIGHT
breast calcifications.

EXAM:
DIAGNOSTIC RIGHT MAMMOGRAM POST STEREOTACTIC BIOPSY

[R ML synth-2D]
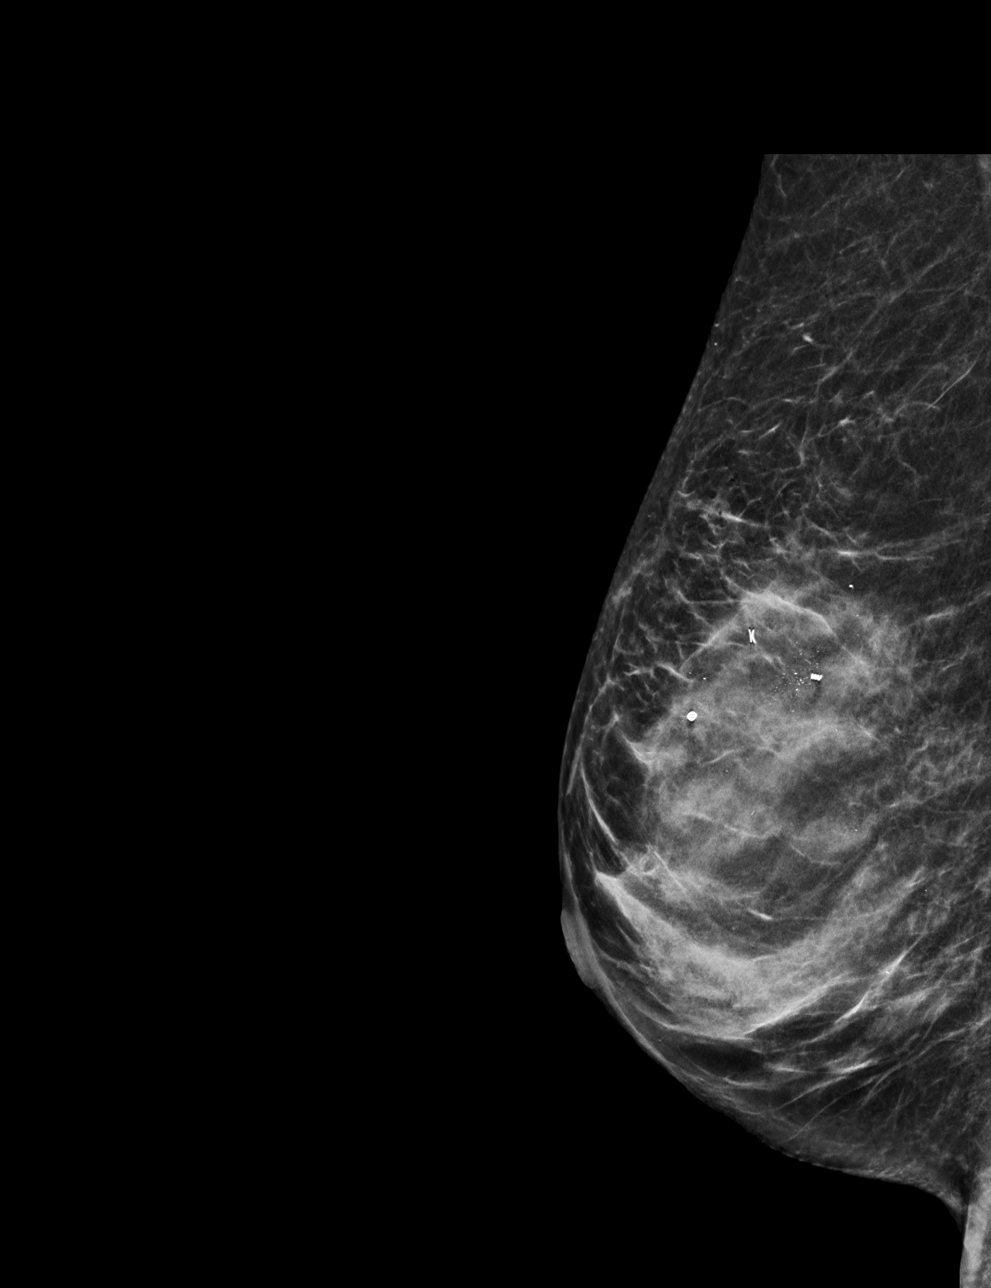

[R CC synth-2D]
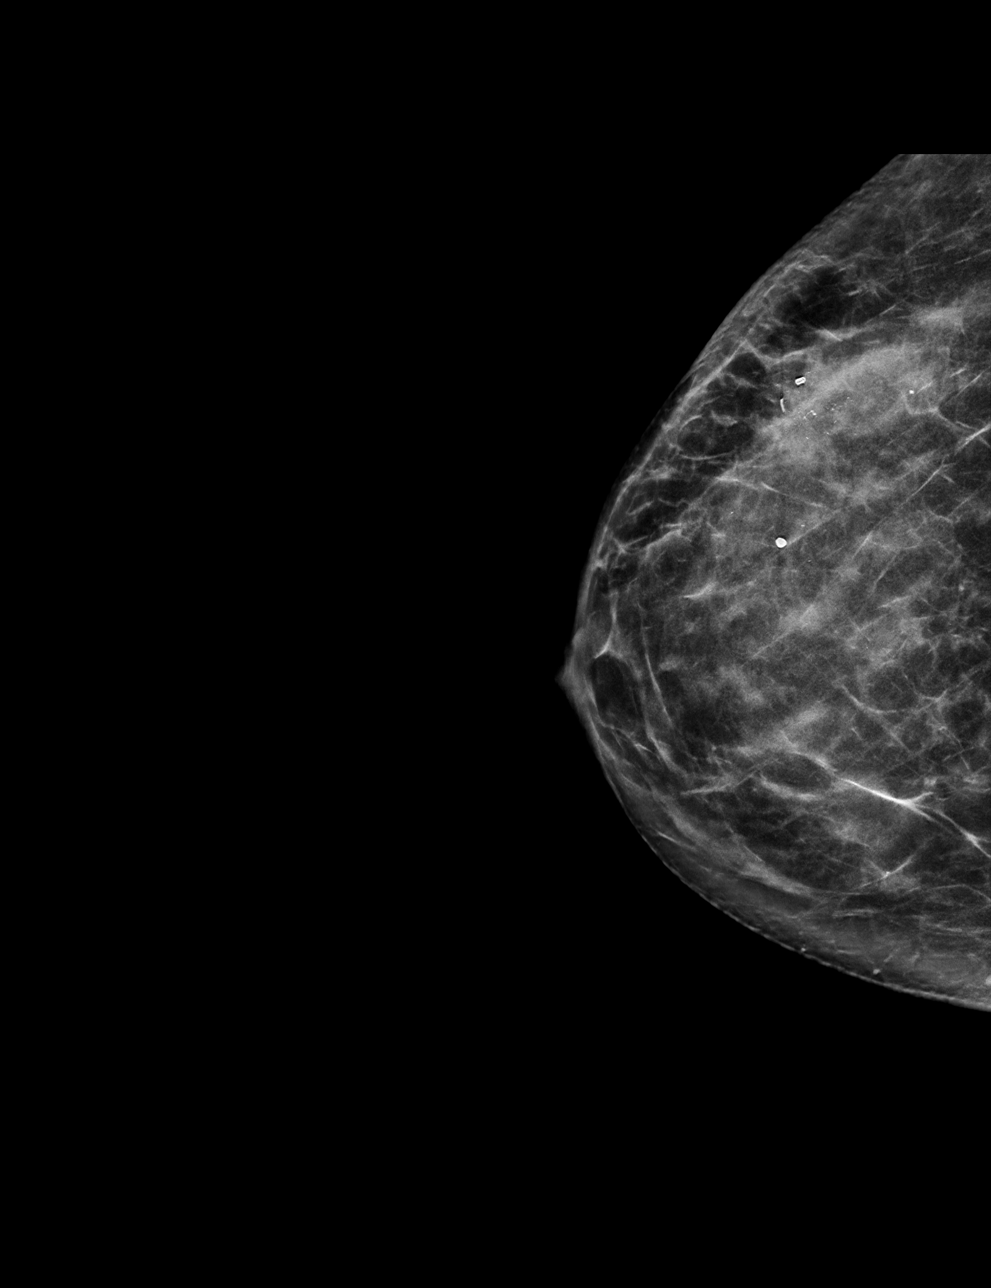

[R ML tomo · tomo slice 25/50.0]
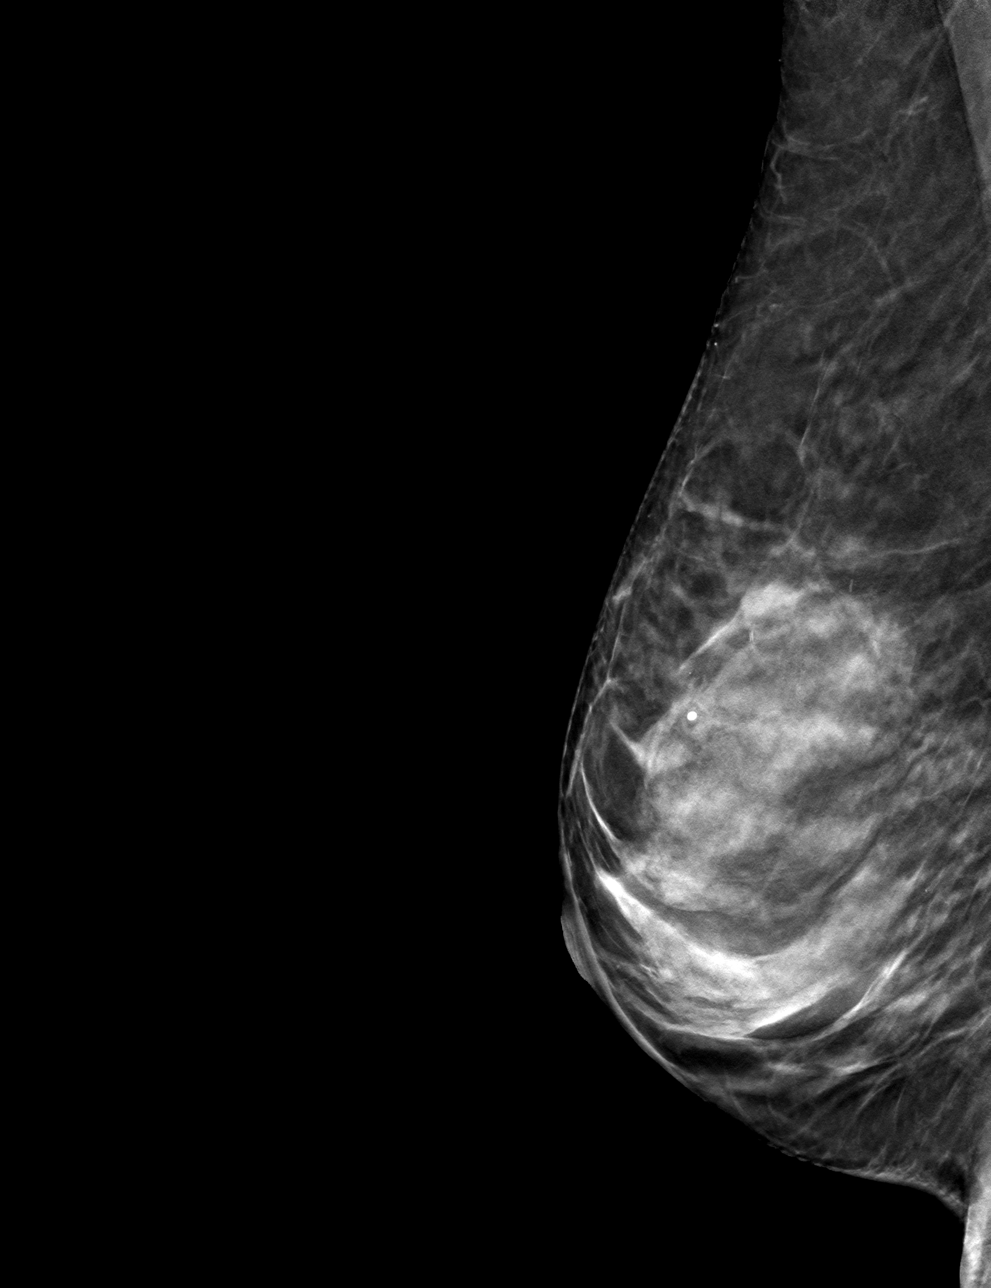

[R CC tomo · tomo slice 30/59.0]
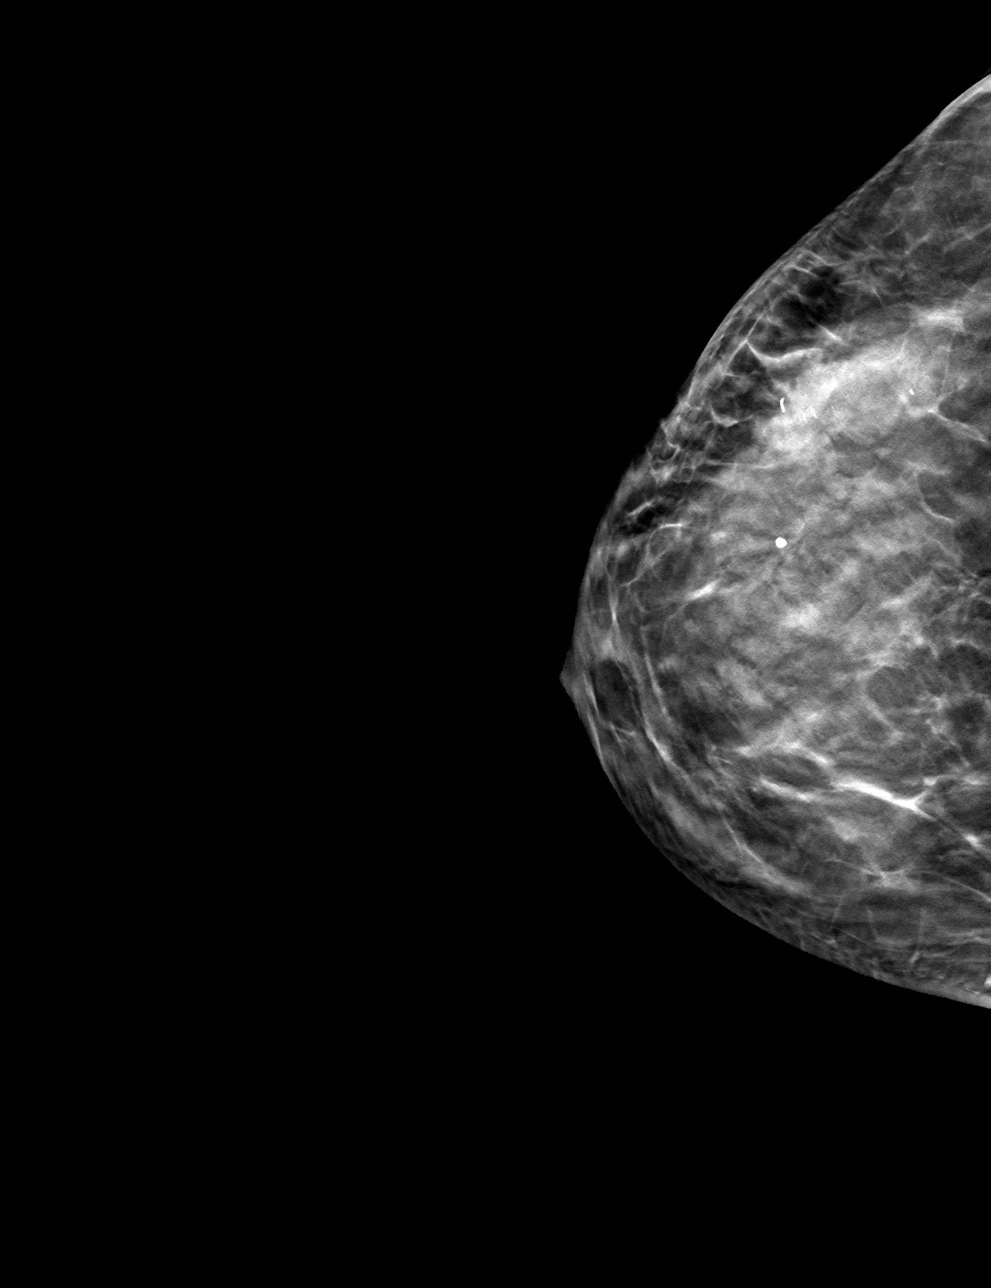

[4 of 12 positions shown; findings below may reference images not displayed]

FINDINGS: Mammographic images were obtained following stereotactic guided
biopsy of calcifications in the UPPER-OUTER QUADRANT of the RIGHT
breast and placement of an X shaped clip. The biopsy marking clip is
in expected position at the site of biopsy.
IMPRESSION: Appropriate positioning of the X shaped biopsy marking clip at the
site of biopsy in the UPPER-OUTER QUADRANT of the RIGHT breast.

Final Assessment: Post Procedure Mammograms for Marker Placement

## 2020-02-02 IMAGING — MG MM BREAST BX W/ LOC DEV 1ST LESION IMAGE BX SPEC STEREO GUIDE*R*
8 of 11 series · 8 of 23 positions shown · non-contrast
Comparison: Previous exams.
COMPARISON: Previous exams.

Addendum:
CLINICAL DATA: Patient presents for stereotactic guided core biopsy
of RIGHT breast calcifications. History of LEFT breast cancer [CE].

EXAM:
RIGHT BREAST STEREOTACTIC CORE NEEDLE BIOPSY

[R (1 of 6)]
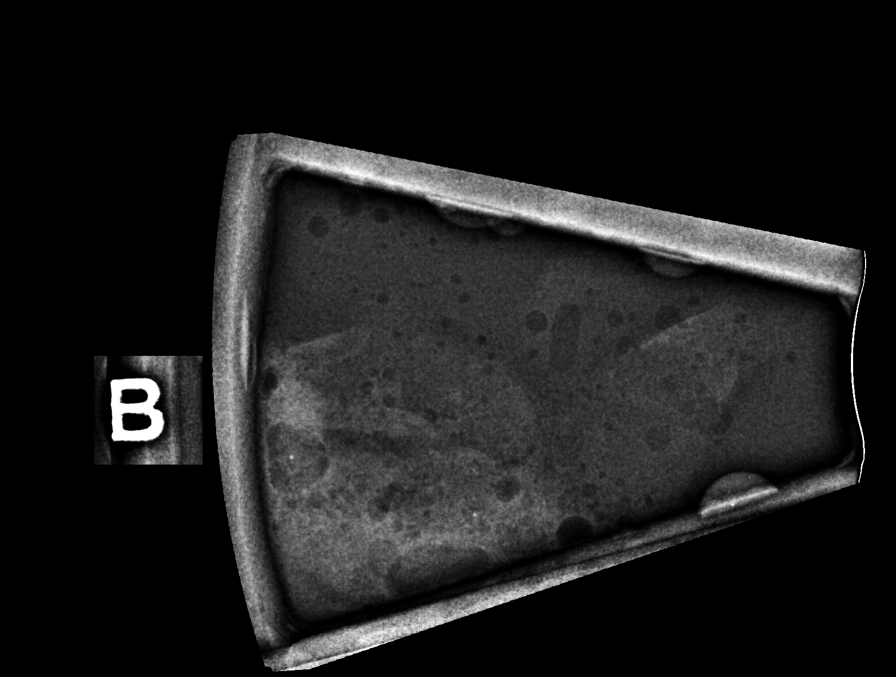

[R (2 of 6)]
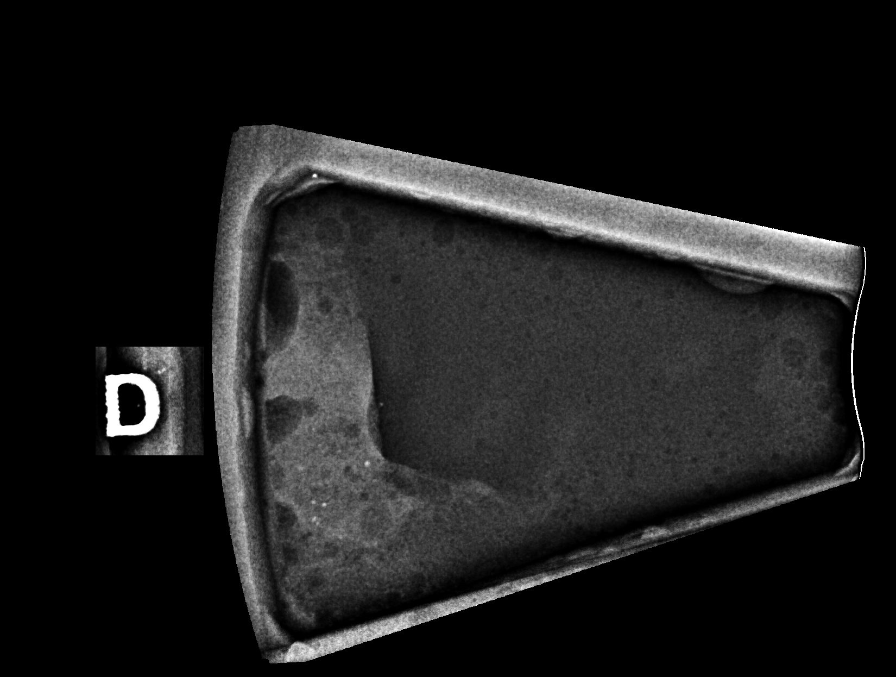

[R (3 of 6)]
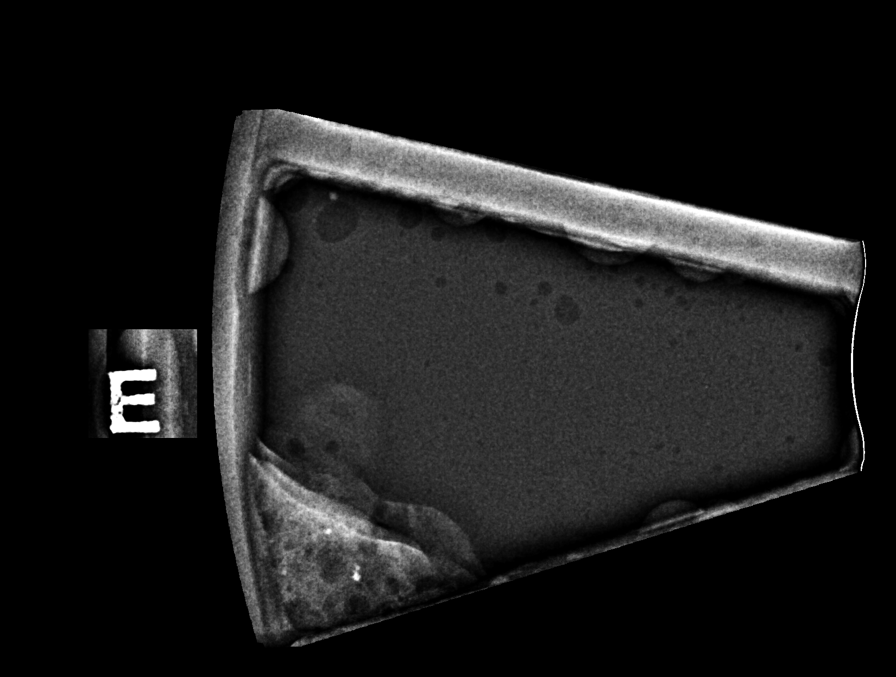

[R (4 of 6)]
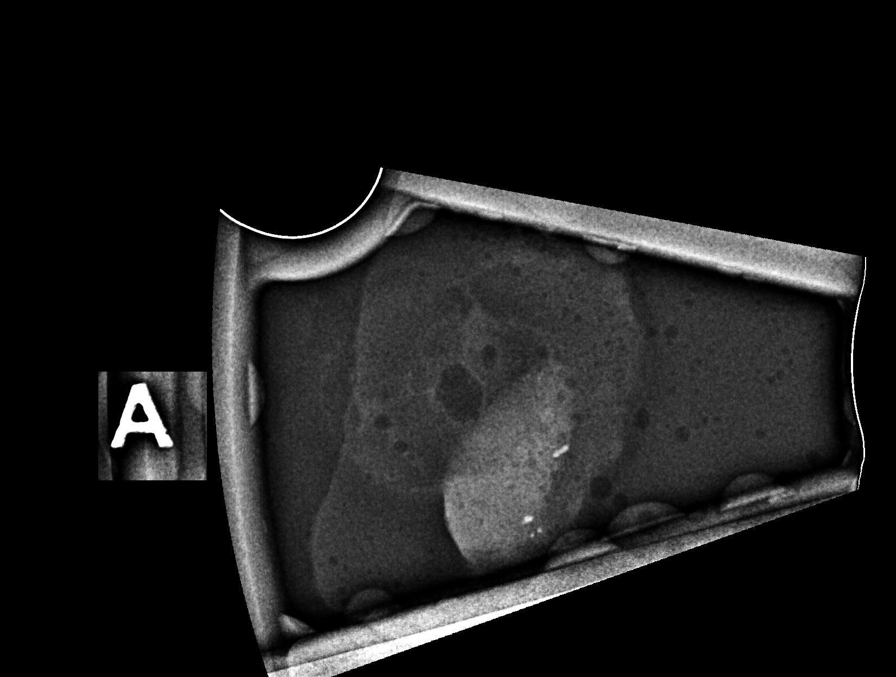

[R (5 of 6)]
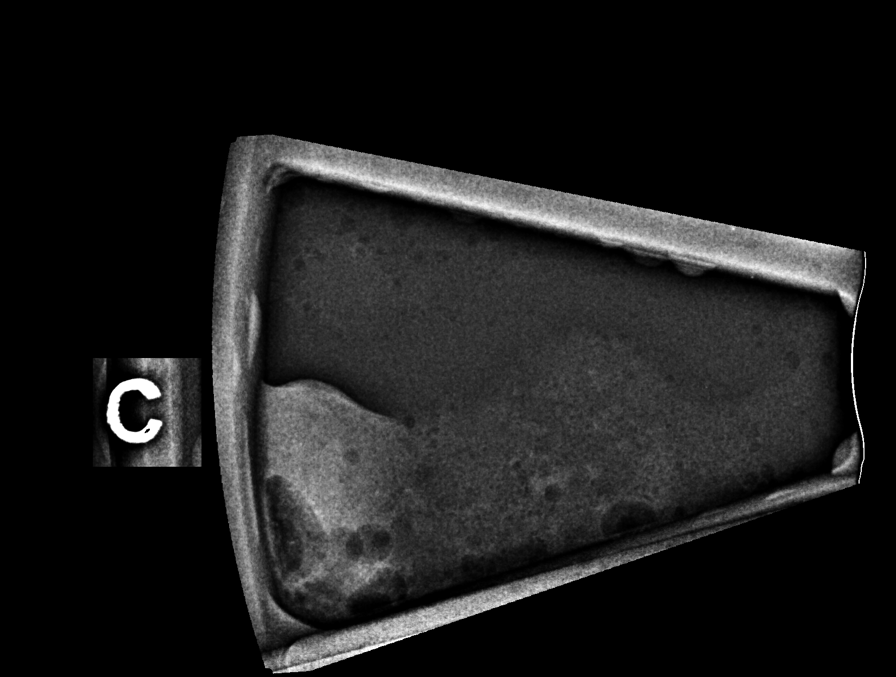

[R (6 of 6)]
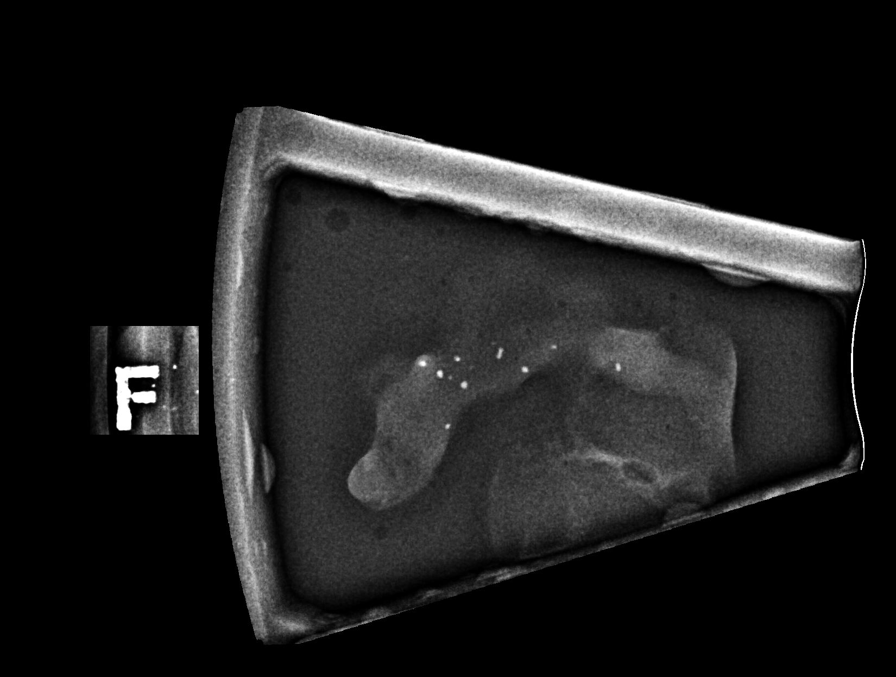

[R CC (1 of 2)]
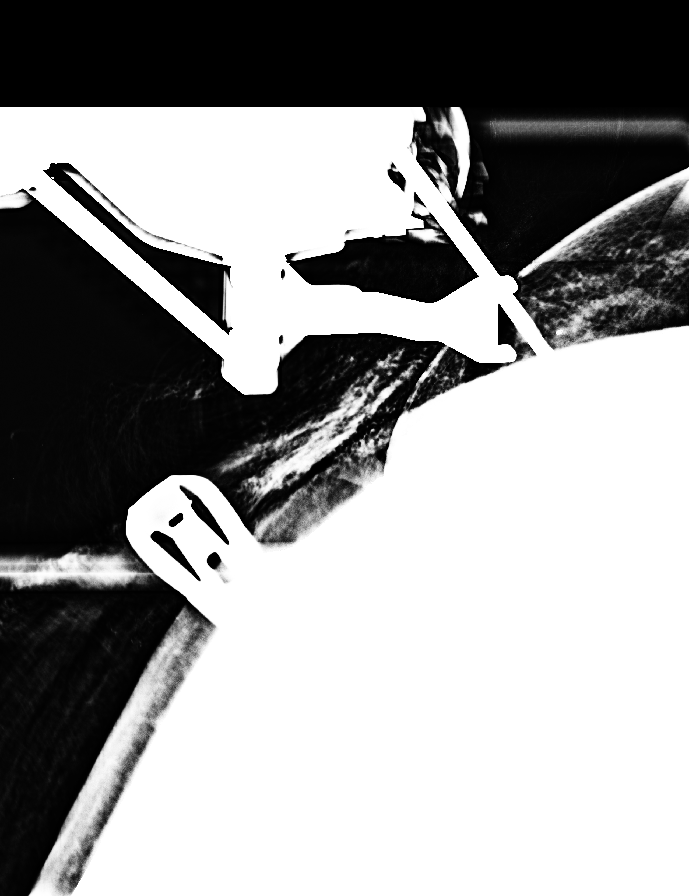

[R CC (2 of 2)]
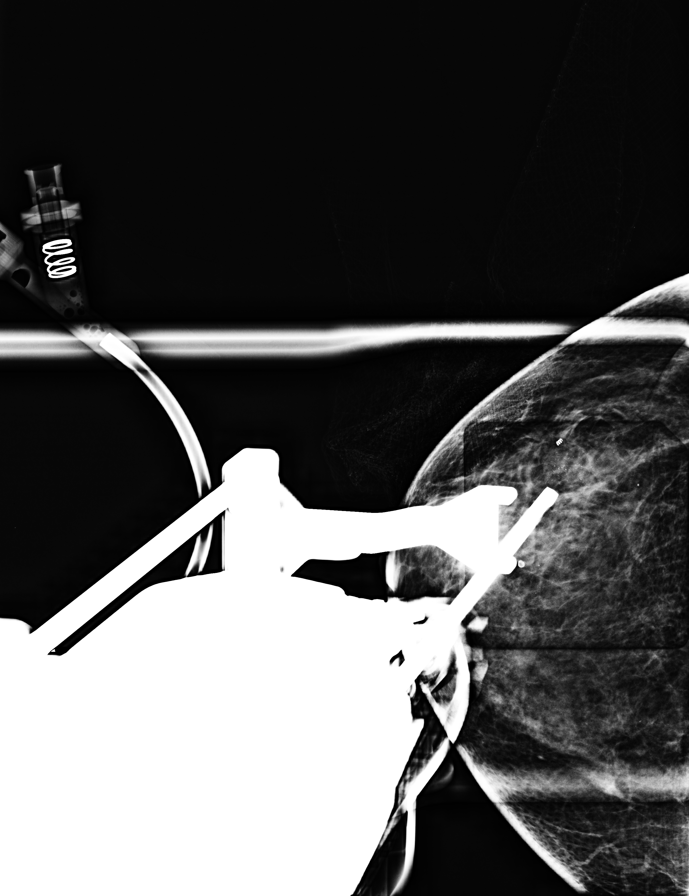

[8 of 23 positions shown; findings below may reference images not displayed]



Using sterile technique and 1% lidocaine and 1% lidocaine with
epinephrine as local anesthetic, under stereotactic guidance, a 9
gauge vacuum assisted device was used to perform core needle biopsy
of calcifications in the UPPER-OUTER QUADRANT of the RIGHT breast
using a craniocaudal approach. Specimen radiograph was performed
showing calcifications and numerous tissue samples. Specimens with
calcifications are identified for pathology.

Lesion quadrant: UPPER-OUTER QUADRANT RIGHT breast

At the conclusion of the procedure, X shaped tissue marker clip was
deployed into the biopsy cavity. Follow-up 2-view mammogram was
performed and dictated separately.
IMPRESSION: Stereotactic-guided biopsy of RIGHT breast calcifications. No
apparent complications.

ADDENDUM:
Pathology revealed LOBULAR CARCINOMA IN SITU WITH PLEOMORPHIC
FEATURES AND CALCIFICATIONS, INVOLVING ADENOSIS of the RIGHT breast,
upper outer quadrant, (x clip). This was found to be concordant by
Dr. MORIS, with excision recommenced.

Pathology results were discussed with the patient by telephone. The
patient reported doing well after the biopsy with tenderness,
bruising and minimal bleeding at the site. Post biopsy instructions
and care were reviewed and questions were answered. The patient was
encouraged to call The [REDACTED] for any
additional concerns. My direct phone number was provided for the
patient.

Surgical consultation has been arranged with Dr. MORIS at
[REDACTED] on [DATE].

Pathology results reported by MORIS, RN on [DATE].



Using sterile technique and 1% lidocaine and 1% lidocaine with
epinephrine as local anesthetic, under stereotactic guidance, a 9
gauge vacuum assisted device was used to perform core needle biopsy
of calcifications in the UPPER-OUTER QUADRANT of the RIGHT breast
using a craniocaudal approach. Specimen radiograph was performed
showing calcifications and numerous tissue samples. Specimens with
calcifications are identified for pathology.

Lesion quadrant: UPPER-OUTER QUADRANT RIGHT breast

At the conclusion of the procedure, X shaped tissue marker clip was
deployed into the biopsy cavity. Follow-up 2-view mammogram was
performed and dictated separately.
IMPRESSION: Stereotactic-guided biopsy of RIGHT breast calcifications. No
apparent complications.

## 2020-02-04 ENCOUNTER — Telehealth: Payer: Self-pay

## 2020-02-04 DIAGNOSIS — D0501 Lobular carcinoma in situ of right breast: Secondary | ICD-10-CM | POA: Insufficient documentation

## 2020-02-04 NOTE — Telephone Encounter (Signed)
Martha [name] left vm requesting call back regarding new breast cancer diagnosis.

## 2020-02-04 NOTE — Progress Notes (Signed)
[place]   Telephone:(336) [phone] Fax:(336) [phone]   Clinic Follow up Note   Patient Care Team: [name], MD as PCP - General (Family Medicine) [name], MD as Consulting Physician (General Surgery) Truitt Merle, MD as Consulting Physician (Hematology) Eppie Gibson, MD as Attending Physician (Radiation Oncology) Alla Feeling, NP as Nurse Practitioner (Nurse Practitioner)   I connected with Martha [name] on 02/05/2020 at  8:20 AM EDT by telephone visit and verified that I am speaking with the correct person using two identifiers.  I discussed the limitations, risks, security and privacy concerns of performing an evaluation and management service by telephone and the availability of in person appointments. I also discussed with the patient that there may be a patient responsible charge related to this service. The patient expressed understanding and agreed to proceed.   Other persons participating in the visit and their role in the encounter:  None  Patient's location:  Her home  Provider's location:  My Office   CHIEF COMPLAINT: F/u of left breast cancer and Right breast LCIS  SUMMARY OF ONCOLOGIC HISTORY: Oncology History Overview Note  Cancer Staging Malignant neoplasm of lower-inner quadrant of left breast in female, estrogen receptor positive ([place]) Staging form: Breast, AJCC 8th Edition - Clinical stage from 01/16/2018: Stage IA (cT1c, cN0, cM0, G2, ER+, PR+, HER2-) - Signed by Truitt Merle, MD on 01/23/2018 - Pathologic: Stage IA (pT1c, pN1, cM0, G1, ER+, PR+, HER2-) - Signed by Eppie Gibson, MD on 04/09/2018     Malignant neoplasm of lower-inner quadrant of left breast in female, estrogen receptor positive ([place])  01/15/2018 Mammogram   Diagnositc Mammogram 01/15/18  IMPRESSION: 1. Suspicious 1.2 x 1.4 x 1.3 cm mixed echogenicity mass left breast 7 o'clock position retroareolar location at the site of palpable concern.. 2. Indeterminate Within  the left breast 7:30 o'clock retroareolar location, adjacent to the palpable mass, is a 0.5 x 0.4 x 0.5 cm oval circumscribed hypoechoic mass. 3. Indeterminate calcifications within the lateral left breast. Location of these calcifications is not definitely confirmed on the true lateral view.    01/16/2018 Initial Biopsy   Diagnosis 01/16/18 1. Breast, left, needle core biopsy, 7:30 o'clock (ribbon clip) - FIBROCYSTIC CHANGES WITH SCLEROSING ADENOSIS AND CALCIFICATIONS. - FIBROADENOMATOID CHANGE. - NO MALIGNANCY IDENTIFIED. 2. Breast, left, needle core biopsy, 7 o'clock position (coil clip) - INVASIVE MAMMARY CARCINOMA, MSBR GRADE I/II. - SEE MICROSCOPIC DESCRIPTION Microscopic Comment  ADDENDUM: Immunohistochemistry for E-Cadherin is strongly positive in the tumor consistent with ductal carcinoma. (JDP:ah 01/17/18)   01/16/2018 Receptors her2   Estrogen Receptor: 100%, POSITIVE, STRONG STAINING INTENSITY Progesterone Receptor: 50%, POSITIVE, STRONG STAINING INTENSITY Proliferation Marker Ki67: 20% HER2 Negative   01/16/2018 Cancer Staging   Staging form: Breast, AJCC 8th Edition - Clinical stage from 01/16/2018: Stage IA (cT1c, cN0, cM0, G2, ER+, PR+, HER2-) - Signed by Truitt Merle, MD on 01/23/2018   01/22/2018 Initial Diagnosis   Malignant neoplasm of lower-inner quadrant of left breast in female, estrogen receptor positive ([place])   02/21/2018 Surgery    LEFT BREAST LUMPECTOMY WITH AXILLARY LYMPH NODE BIOPSY by Dr. Barry Dienes  02/21/18   02/21/2018 Pathology Results   Diagnosis 02/21/18 1. Breast, lumpectomy, Left - INVASIVE DUCTAL CARCINOMA, GRADE I, 1.6 CM. - DUCTAL CARCINOMA IN SITU, INTERMEDIATE NUCLEAR GRADE. - ANTERIOR AND MEDIAL RESECTION MARGINS ARE POSITIVE FOR CARCINOMA. - NEGATIVE FOR LYMPHOVASCULAR OR PERINEURAL INVASION. - BACKGROUND BREAST TISSUE WITH FIBROCYSTIC CHANGE, INCLUDING SCLEROSING ADENOSIS. - BIOPSY SITE CHANGES. -  SEE ONCOLOGY TABLE. 2. Lymph node,  sentinel, biopsy, Left Axillary #1 - METASTATIC BREAST CARCINOMA TO A LYMPH NODE, 1.0 CM IN GREATEST DIMENSION, WITH EXTRANODAL EXTENSION (1/1). 3. Lymph node, sentinel, biopsy, Left Axillary #2 - LYMPH NODE, NEGATIVE FOR CARCINOMA (0/1).    02/21/2018 Miscellaneous   Mammaprint 02/21/18 Low Risk with 10-year risk of recurrnce at 10% -No potential signifcant chemotherapy benefit   03/20/2018 Pathology Results   RE-EXCISION OF BREAST LUMPECTOMY by Dr. Barry Dienes  Diagnosis 03/20/18 1. Breast, excision, Left new anterior margin - FIBROCYSTIC CHANGES WITH ADENOSIS AND CALCIFICATIONS. - HEALING BIOPSY SITE. - THERE IS NO EVIDENCE OF MALIGNANCY. 2. Breast, excision, Left new medial margin - FIBROCYSTIC CHANGES WITH ADENOSIS AND CALCIFICATIONS. - HEALING BIOPSY SITE. - THERE IS NO EVIDENCE OF MALIGNANCY. Microscopic Comment 1. -2. The surgical resection margin(s) of the specimen were inked and microscopically evaluated. (JBK:kh 03-22-18)   04/09/2018 Cancer Staging   Staging form: Breast, AJCC 8th Edition - Pathologic: Stage IA (pT1c, pN1, cM0, G1, ER+, PR+, HER2-) - Signed by Eppie Gibson, MD on 04/09/2018   04/22/2018 - 06/03/2018 Radiation Therapy   Radaiton with Dr. Isidore Moos 04/22/18-06/03/18   05/2018 -  Anti-estrogen oral therapy   Letrozole 2.61m started 05/2018    Survivorship   Per LCira Rue NP    Lobular carcinoma in situ (LCIS) of right breast  01/20/2020 Mammogram   Diagnostic Mammogram 01/20/20 IMPRESSION: 1.  Stable post lumpectomy changes of the left breast.   2. Suspicious microcalcifications over the right upper outer quadrant spanning 3.6 cm.   02/02/2020 Initial Biopsy   Diagnosis 02/02/20 Breast, right, needle core biopsy, upper outer quadrant, x clip - LOBULAR CARCINOMA IN SITU WITH PLEOMORPHIC FEATURES AND CALCIFICATIONS, INVOLVING ADENOSIS. SEE NOTE Diagnosis Note Immunohistochemical stain for E-cadherin is negative in the lesional cells, consistent with a lobular  phenotype. Immunostains for p63, SMM 1 and calponin do not show evidence of invasive carcinoma.    02/04/2020 Initial Diagnosis   Lobular carcinoma in situ (LCIS) of right breast      CURRENT THERAPY:  Letrozole 2.54mstartedin10/2019  INTERVAL HISTORY:  Martha Tankards here for a follow up of left breast cancer and right breast LCIS. She was last seen by me in 11/2018. She notes she did feel her right breast mass, between walnut and grape fruit size. She denies any other breast or nipple change of her breast. She is planning to get her COVID19 vaccine and wondering if she can get it in the arm.    REVIEW OF SYSTEMS:   Constitutional: Denies fevers, chills or abnormal weight loss Eyes: Denies blurriness of vision Ears, nose, mouth, throat, and face: Denies mucositis or sore throat Respiratory: Denies cough, dyspnea or wheezes Cardiovascular: Denies palpitation, chest discomfort or lower extremity swelling Gastrointestinal:  Denies nausea, heartburn or change in bowel habits Skin: Denies abnormal skin rashes Lymphatics: Denies new lymphadenopathy or easy bruising Neurological:Denies numbness, tingling or new weaknesses Behavioral/Psych: Mood is stable, no new changes  All other systems were reviewed with the patient and are negative.  MEDICAL HISTORY:  Past Medical History:  Diagnosis Date  . Dysrhythmia    SVT, s/p ablation ~ 2012 in at [facility]. History of radiation therapy 04/22/18-06/03/18   Left Breast, left SCV, axilla 50 Gy in 25 fractions, Left breast boost 10 Gy in 5 fractions.   . Personal history of radiation therapy   . PONV (postoperative nausea and vomiting)   . Thyroid disease  SURGICAL HISTORY: Past Surgical History:  Procedure Laterality Date  . APPENDECTOMY    . BREAST BIOPSY Right 2014   fibroadenoma  . BREAST EXCISIONAL BIOPSY Left 1990   benign  . BREAST LUMPECTOMY Left   . BREAST LUMPECTOMY WITH AXILLARY LYMPH NODE BIOPSY Left  02/21/2018   Procedure: LEFT BREAST LUMPECTOMY WITH AXILLARY LYMPH NODE BIOPSY;  Surgeon: [name], MD;  Location: [facility];  Service: General;  Laterality: Left;  . BREAST SURGERY    . EYE SURGERY    . RE-EXCISION OF BREAST LUMPECTOMY Left 03/20/2018   Procedure: RE-EXCISION OF BREAST LUMPECTOMY;  Surgeon: [name], MD;  Location: [facility];  Service: General;  Laterality: Left;    I have reviewed the social history and family history with the patient and they are unchanged from previous note.  ALLERGIES:  has No Known Allergies.  MEDICATIONS:  Current Outpatient Medications  Medication Sig Dispense Refill  . letrozole (FEMARA) 2.5 MG tablet TAKE 1 TABLET BY MOUTH EVERY DAY 90 tablet 1  . levothyroxine (SYNTHROID) 75 MCG tablet Take 1 tablet (75 mcg total) by mouth daily before breakfast. 90 tablet 1  . OVER THE COUNTER MEDICATION Take 1 tablet by mouth daily. Protandim Supplement    . PARoxetine (PAXIL) 20 MG tablet TAKE 1 TABLET (20 MG TOTAL) BY MOUTH DAILY. AS DIRECTED 90 tablet 0  . Probiotic Product (PROBIOTIC PO) Take 1 capsule by mouth daily.    Marland Kitchen triamcinolone ointment (KENALOG) 0.5 % Apply 1 application topically 2 (two) times daily. 30 g 0   No current facility-administered medications for this visit.    PHYSICAL EXAMINATION: ECOG PERFORMANCE STATUS: 0 - Asymptomatic  No vitals taken today, Exam not performed today   LABORATORY DATA:  I have reviewed the data as listed CBC Latest Ref Rng & Units 06/03/2018 02/14/2018 01/23/2018  WBC 3.9 - 10.3 K/uL 6.3 5.4 10.7(H)  Hemoglobin 11.6 - 15.9 g/dL 14.7 14.6 14.5  Hematocrit 34 - 46 % 43.0 45.0 43.5  Platelets 145 - 400 K/uL 204 190 209     CMP Latest Ref Rng & Units 06/03/2018 02/14/2018 01/23/2018  Glucose 70 - 99 mg/dL 99 93 109  BUN 6 - 20 mg/dL 10 <5(L) 15  Creatinine 0.44 - 1.00 mg/dL 0.69 0.60 0.80  Sodium 135 - 145 mmol/L 140 142 140  Potassium 3.5 - 5.1 mmol/L 3.9 4.2 5.2(H)  Chloride 98 -  111 mmol/L 101 107 104  CO2 22 - 32 mmol/L 29 27 25   Calcium 8.9 - 10.3 mg/dL 10.0 9.4 9.8  Total Protein 6.5 - 8.1 g/dL 7.6 - 7.4  Total Bilirubin 0.3 - 1.2 mg/dL 0.8 - 0.7  Alkaline Phos 38 - 126 U/L 86 - 89  AST 15 - 41 U/L 18 - 19  ALT 0 - 44 U/L 12 - 17      RADIOGRAPHIC STUDIES: I have personally reviewed the radiological images as listed and agreed with the findings in the report. No results found.   ASSESSMENT & PLAN:  Martha [name] is a 59 y.o. female with    1. Right breast LCIS, pleomorphic -I discussed her breast imaging and needle biopsy results with patient and her family members in great detail. She has a 3.6cm area of calcification in her right breast which her biopsy found to be Lobular carcinoma in situ, pleomorphic.. This is a precancerous mass, that is different from her first breast cancer and makes her high risk for future breast cancer.   -  I discussed that biopsy may have sampling limitation, and this is up to 20-30% risk of concurrent invasive cancer and LCIS.  So lumpectomy is recommended.  We will review her surgical path, to see if she has any invasive carcinoma components. -She is a candidate for breast conservation surgery. She will make an appointment to see her breast surgeon Dr. Barry Dienes. She is also considering b/l mastectomy with reconstruction which she will discuss with Dr Barry Dienes. I discussed with the later option, she may have sentinel LN biopsy. I discussed medically a lumpectomy is enough.  -Given her LCIS disease, she will continue antiestrogen therapy. adjuvant Radiation is not recommended unless she is found to have DCIS or invasive disease.  -I will f/u with her after surgery.  -She is fine to proceed with COVID19 vaccine and use her right if needed prior to surgery   2. Malignant neoplasm of lower-inner quadrant of left breast, invasive ductal carcinoma, stage IA,pT1cN1M0, ER/PR Positive, HER2 Negative, Grade I, Mammaprint low risk -She was  diagnosed in 12/2017. She is s/p left breast lumpectomy and adjuvant radiation.  -She started anti-estrogen therapy with letrozole on 05/2018. Tolerating well with no issues.  3. Smoking Cessation, Alcohol Use  -She has been smoking for 40 years 1 pack a day. She is eligible for lung cancer screenings. She is interested.  -She has set a date to quit smoking. I encouraged her to continue to work at this.  -I previously recommended she cut down on her alcohol intake. She is willing to do this.   4. Hypothyroidism -New medication and follow-up with PCP  5. Bone Health  -DEXA from 07/2018 was normal with lowest T-score of -0.8 at left hip.  -I discussed the result with her, and encourage her to take VitD -we discussed that letrozole may cause osteopenia and osteoporosis    PLAN:  -Continue letrozole  -I will send note to Dr. Barry Dienes  -F/u after surgery    No problem-specific Assessment & Plan notes found for this encounter.   No orders of the defined types were placed in this encounter.  I discussed the assessment and treatment plan with the patient. The patient was provided an opportunity to ask questions and all were answered. The patient agreed with the plan and demonstrated an understanding of the instructions.  The patient was advised to call back or seek an in-person evaluation if the symptoms worsen or if the condition fails to improve as anticipated.  The total time spent in the appointment was 25 minutes.    Truitt Merle, MD 02/05/2020   I, Joslyn Devon, am acting as scribe for Truitt Merle, MD.   I have reviewed the above documentation for accuracy and completeness, and I agree with the above.

## 2020-02-05 ENCOUNTER — Inpatient Hospital Stay: Payer: Commercial Managed Care - PPO | Attending: Hematology | Admitting: Hematology

## 2020-02-05 ENCOUNTER — Encounter: Payer: Self-pay | Admitting: Hematology

## 2020-02-05 ENCOUNTER — Other Ambulatory Visit: Payer: Self-pay

## 2020-02-05 DIAGNOSIS — C50312 Malignant neoplasm of lower-inner quadrant of left female breast: Secondary | ICD-10-CM

## 2020-02-05 DIAGNOSIS — Z170 Estrogen receptor positive status [ER+]: Secondary | ICD-10-CM

## 2020-02-05 DIAGNOSIS — D0501 Lobular carcinoma in situ of right breast: Secondary | ICD-10-CM | POA: Diagnosis not present

## 2020-02-17 ENCOUNTER — Other Ambulatory Visit: Payer: Self-pay | Admitting: General Surgery

## 2020-02-17 DIAGNOSIS — D0501 Lobular carcinoma in situ of right breast: Secondary | ICD-10-CM

## 2020-02-26 ENCOUNTER — Other Ambulatory Visit: Payer: Self-pay | Admitting: General Surgery

## 2020-02-26 DIAGNOSIS — C50411 Malignant neoplasm of upper-outer quadrant of right female breast: Secondary | ICD-10-CM

## 2020-03-03 ENCOUNTER — Other Ambulatory Visit: Payer: Self-pay | Admitting: General Surgery

## 2020-03-03 DIAGNOSIS — C50411 Malignant neoplasm of upper-outer quadrant of right female breast: Secondary | ICD-10-CM

## 2020-03-09 NOTE — Progress Notes (Signed)
Your procedure is scheduled on Thursday July 22 .  Report to [name] Main Entrance "A" at 12:30 P.M., and check in at the Admitting office.  Call this number if you have problems the morning of surgery: [phone]  Call [phone] if you have any questions prior to your surgery date Monday-Friday 8am-4pm   Remember: Do not eat after midnight the night before your surgery  You may drink clear liquids until 11:30 A.M. the morning of your surgery.   Clear liquids allowed are: Water, Non-Citrus Juices (without pulp), Carbonated Beverages, Clear Tea, Black Coffee Only, and Gatorade  Please complete your PRE-SURGERY ENSURE that was provided to you by 11:30 A.M. the morning of surgery.  Please, if able, drink it in one setting. DO NOT SIP.    Take these medicines the morning of surgery with A SIP OF WATER: letrozole (FEMARA) levothyroxine (SYNTHROID)  PARoxetine (PAXIL)   As of today, STOP taking any Aspirin (unless otherwise instructed by your surgeon), Aleve, Naproxen, Ibuprofen, Motrin, Advil, Goody's, BC's, all herbal medications, fish oil, and all vitamins.    The Morning of Surgery  Do not wear jewelry, make-up or nail polish.  Do not wear lotions, powders, or perfumes, or deodorant  Do not shave 48 hours prior to surgery.    Do not bring valuables to the hospital.  [facility] is not responsible for any belongings or valuables.  If you are a smoker, DO NOT Smoke 24 hours prior to surgery  If you wear a CPAP at night please bring your mask the morning of surgery   Remember that you must have someone to transport you home after your surgery, and remain with you for 24 hours if you are discharged the same day.   Please bring cases for contacts, glasses, hearing aids, dentures or bridgework because it cannot be worn into surgery.    Leave your suitcase in the car.  After surgery it may be brought to your room.  For patients admitted to the hospital, discharge time  will be determined by your treatment team.  Patients discharged the day of surgery will not be allowed to drive home.    Special instructions:   [facility]- Preparing For Surgery  Before surgery, you can play an important role. Because skin is not sterile, your skin needs to be as free of germs as possible. You can reduce the number of germs on your skin by washing with CHG (chlorahexidine gluconate) Soap before surgery.  CHG is an antiseptic cleaner which kills germs and bonds with the skin to continue killing germs even after washing.    Oral Hygiene is also important to reduce your risk of infection.  Remember - BRUSH YOUR TEETH THE MORNING OF SURGERY WITH YOUR REGULAR TOOTHPASTE  Please do not use if you have an allergy to CHG or antibacterial soaps. If your skin becomes reddened/irritated stop using the CHG.  Do not shave (including legs and underarms) for at least 48 hours prior to first CHG shower. It is OK to shave your face.  Please follow these instructions carefully.   1. Shower the NIGHT BEFORE SURGERY and the MORNING OF SURGERY with CHG Soap.   2. If you chose to wash your hair and body, wash as usual with your normal shampoo and body-wash/soap.  3. Rinse your hair and body thoroughly to remove the shampoo and soap.  4. Apply CHG directly to the skin (ONLY FROM THE NECK DOWN) and wash gently with a scrungie  or a clean washcloth.   5. Do not use on open wounds or open sores. Avoid contact with your eyes, ears, mouth and genitals (private parts). Wash Face and genitals (private parts)  with your normal soap.   6. Wash thoroughly, paying special attention to the area where your surgery will be performed.  7. Thoroughly rinse your body with warm water from the neck down.  8. DO NOT shower/wash with your normal soap after using and rinsing off the CHG Soap.  9. Pat yourself dry with a CLEAN TOWEL.  10. Wear CLEAN PAJAMAS to bed the night before surgery  11. Place CLEAN  SHEETS on your bed the night of your first shower and DO NOT SLEEP WITH PETS.  12. Wear comfortable clothes the morning of surgery.     Day of Surgery:  Please shower the morning of surgery with the CHG soap Do not apply any deodorants/lotions. Please wear clean clothes to the hospital/surgery center.   Remember to brush your teeth WITH YOUR REGULAR TOOTHPASTE.   Please read over the following fact sheets that you were given.

## 2020-03-10 ENCOUNTER — Encounter (HOSPITAL_COMMUNITY)
Admission: RE | Admit: 2020-03-10 | Discharge: 2020-03-10 | Disposition: A | Discharge status: Home / Self Care | Payer: Commercial Managed Care - PPO | Source: Ambulatory Visit | Attending: General Surgery | Admitting: General Surgery

## 2020-03-10 ENCOUNTER — Encounter (HOSPITAL_COMMUNITY): Payer: Self-pay

## 2020-03-10 ENCOUNTER — Other Ambulatory Visit: Payer: Self-pay

## 2020-03-10 DIAGNOSIS — Z01812 Encounter for preprocedural laboratory examination: Secondary | ICD-10-CM | POA: Diagnosis not present

## 2020-03-10 LAB — COMPREHENSIVE METABOLIC PANEL
ALT: 18 U/L (ref 0–44)
AST: 20 U/L (ref 15–41)
Albumin: 4.1 g/dL (ref 3.5–5.0)
Alkaline Phosphatase: 81 U/L (ref 38–126)
Anion gap: 14 (ref 5–15)
BUN: 11 mg/dL (ref 6–20)
CO2: 21 mmol/L — ABNORMAL LOW (ref 22–32)
Calcium: 9.5 mg/dL (ref 8.9–10.3)
Chloride: 103 mmol/L (ref 98–111)
Creatinine, Ser: 0.62 mg/dL (ref 0.44–1.00)
GFR calc Af Amer: >60 mL/min (ref >60)
GFR calc non Af Amer: >60 mL/min (ref >60)
Glucose, Bld: 112 mg/dL — ABNORMAL HIGH (ref 70–99)
Potassium: 4.2 mmol/L (ref 3.5–5.1)
Sodium: 138 mmol/L (ref 135–145)
Total Bilirubin: 0.5 mg/dL (ref 0.3–1.2)
Total Protein: 7 g/dL (ref 6.5–8.1)

## 2020-03-10 LAB — CBC
HCT: 45.9 % (ref 36.0–46.0)
Hemoglobin: 15.5 g/dL — ABNORMAL HIGH (ref 12.0–15.0)
MCH: 33.2 pg (ref 26.0–34.0)
MCHC: 33.8 g/dL (ref 30.0–36.0)
MCV: 98.3 fL (ref 80.0–100.0)
Platelets: 288 10*3/uL (ref 150–400)
RBC: 4.67 MIL/uL (ref 3.87–5.11)
RDW: 13.9 % (ref 11.5–15.5)
WBC: 6.8 10*3/uL (ref 4.0–10.5)
nRBC: 0 % (ref 0.0–0.2)

## 2020-03-10 NOTE — Progress Notes (Signed)
PCP - Dr. Dimas Chyle Cardiologist - denies  PPM/ICD - denies  Chest x-ray - N/A EKG - N/A Stress Test - denies ECHO - denies Cardiac Cath - per patient, may have been done back in 2012 with cardiac ablation, not sure  Sleep Study - denies CPAP - N/A  DM: denies  Blood Thinner Instructions: N/A Aspirin Instructions: N/A  ERAS Protcol - Yes PRE-SURGERY Ensure or G2-  Ensure given  COVID TEST- Scheduled for 03/15/2020.Patient verbalized understanding of self-quarantine instructions, appointment time and place.  Anesthesia review: Yes, seed placement scheduled for 03/17/2020  Patient denies shortness of breath, fever, cough and chest pain at PAT appointment  All instructions explained to the patient, with a verbal understanding of the material. Patient agrees to go over the instructions while at home for a better understanding. Patient also instructed to self quarantine after being tested for COVID-19. The opportunity to ask questions was provided.

## 2020-03-11 NOTE — Progress Notes (Signed)
Anesthesia Chart Review:  Case: [id number] Date/Time: 03/18/20 1415   Procedure: RIGHT BREAST LUMPECTOMY WITH RADIOACTIVE SEED LOCALIZATION (Right Breast)   Anesthesia type: General   Pre-op diagnosis: RIGHT BREAST CANCER   Location: [facility] OR ROOM 02 / [place] OR   Surgeons: [name], MD      DISCUSSION: Patient is a 59 year old female scheduled for the above procedure. RSL 03/17/20.  History includes smoking, post-operative N/V, SVT (s/p ablation ~2012 in [place] at [facility], no longer sees cardiology; no recurrence or issues since), breast cancer (left breast lumpectomy with 1/2 +LN invasive ductal carcinoma 02/21/18, s/p re-excision margins 03/20/18, s/p radiation 04/22/18-06/03/18; lobular carcinoma in situ right breat 02/02/20), hypothyroidism (on levothyroxine).    Presurgical COVID-19 test is scheduled for 03/15/2020.  She is for RSL on 03/17/2020.  Anesthesia team to evaluate on the day of surgery.   VS: BP 128/74   Pulse 93   Temp 37.2 C (Oral)   Resp 20   Ht 5' 2.5" (1.588 m)   Wt 54.2 kg   SpO2 99%   BMI 21.49 kg/m    PROVIDERS: [name], MD his PCP Truitt Merle, MD is [facility]   LABS: Labs reviewed: Acceptable for surgery. (all labs ordered are listed, but only abnormal results are displayed)  Labs Reviewed  CBC - Abnormal; Notable for the following components:      Result Value   Hemoglobin 15.5 (*)    All other components within normal limits  COMPREHENSIVE METABOLIC PANEL - Abnormal; Notable for the following components:   CO2 21 (*)    Glucose, Bld 112 (*)    All other components within normal limits    EKG: Normal EKG on 02/14/18.    CV: Denied recent cardiac testing--unsure what testing she may have had prior to SVT ablation in 2012.  She does not think that she had a stress test or an echocardiogram.   Past Medical History:  Diagnosis Date  . Cancer [facility]) 2021   right breast  . Cancer ([place])    left breast  . Dysrhythmia    SVT, s/p  ablation ~ 2012 in at [facility]  . History of radiation therapy 04/22/18-06/03/18   Left Breast, left SCV, axilla 50 Gy in 25 fractions, Left breast boost 10 Gy in 5 fractions.   . Personal history of radiation therapy   . PONV (postoperative nausea and vomiting)   . Thyroid disease     Past Surgical History:  Procedure Laterality Date  . APPENDECTOMY    . BREAST BIOPSY Right 2014   fibroadenoma  . BREAST EXCISIONAL BIOPSY Left 1990   benign  . BREAST LUMPECTOMY Left   . BREAST LUMPECTOMY WITH AXILLARY LYMPH NODE BIOPSY Left 02/21/2018   Procedure: LEFT BREAST LUMPECTOMY WITH AXILLARY LYMPH NODE BIOPSY;  Surgeon: [name], MD;  Location: [place];  Service: General;  Laterality: Left;  . BREAST SURGERY    . EYE SURGERY    . GLAUCOMA SURGERY Bilateral   . RE-EXCISION OF BREAST LUMPECTOMY Left 03/20/2018   Procedure: RE-EXCISION OF BREAST LUMPECTOMY;  Surgeon: [name], MD;  Location: [place];  Service: General;  Laterality: Left;    MEDICATIONS: . letrozole ([place]) 2.5 MG tablet  . levothyroxine (SYNTHROID) 75 MCG tablet  . OVER THE COUNTER MEDICATION  . PARoxetine (PAXIL) 20 MG tablet  . Probiotic Product (PROBIOTIC PO)  . triamcinolone ointment (KENALOG) 0.5 %   No current facility-administered medications for this encounter.  Protandim supplement daily.   Myra Gianotti, PA-C Surgical Short Stay/Anesthesiology [facility] Phone [phone] [facility] Phone [phone] 03/11/2020 4:06 PM

## 2020-03-11 NOTE — Anesthesia Preprocedure Evaluation (Addendum)
Anesthesia Evaluation  Patient identified by MRN, date of birth, ID band Patient awake    Reviewed: Allergy & Precautions, NPO status , Patient's Chart, lab work & pertinent test results  History of Anesthesia Complications (+) PONV and history of anesthetic complications  Airway Mallampati: I  TM Distance: >3 FB Neck ROM: Full    Dental  (+) Teeth Intact, Dental Advisory Given   Pulmonary Current Smoker and Patient abstained from smoking.,    Pulmonary exam normal breath sounds clear to auscultation       Cardiovascular Normal cardiovascular exam+ dysrhythmias (s/p ablation) Supra Ventricular Tachycardia  Rhythm:Regular Rate:Normal     Neuro/Psych negative neurological ROS  negative psych ROS   GI/Hepatic negative GI ROS, Neg liver ROS,   Endo/Other  Hypothyroidism   Renal/GU negative Renal ROS     Musculoskeletal negative musculoskeletal ROS (+)   Abdominal   Peds  Hematology negative hematology ROS (+)   Anesthesia Other Findings Day of surgery medications reviewed with the patient.  Reproductive/Obstetrics                           Anesthesia Physical Anesthesia Plan  ASA: II  Anesthesia Plan: General   Post-op Pain Management:    Induction: Intravenous  PONV Risk Score and Plan: 4 or greater and TIVA, Midazolam, Dexamethasone, Ondansetron and Scopolamine patch - Pre-op  Airway Management Planned: LMA  Additional Equipment:   Intra-op Plan:   Post-operative Plan: Extubation in OR  Informed Consent: I have reviewed the patients History and Physical, chart, labs and discussed the procedure including the risks, benefits and alternatives for the proposed anesthesia with the patient or authorized representative who has indicated his/her understanding and acceptance.     Dental advisory given  Plan Discussed with: CRNA  Anesthesia Plan Comments: (PAT note written 03/11/2020  by Myra Gianotti, PA-C. )      Anesthesia Quick Evaluation

## 2020-03-13 ENCOUNTER — Other Ambulatory Visit: Payer: Self-pay | Admitting: Hematology

## 2020-03-13 ENCOUNTER — Other Ambulatory Visit: Payer: Self-pay | Admitting: Family Medicine

## 2020-03-15 ENCOUNTER — Other Ambulatory Visit (HOSPITAL_COMMUNITY)
Admission: RE | Admit: 2020-03-15 | Discharge: 2020-03-15 | Disposition: A | Discharge status: Home / Self Care | Payer: Commercial Managed Care - PPO | Source: Ambulatory Visit | Attending: General Surgery | Admitting: General Surgery

## 2020-03-15 DIAGNOSIS — Z20822 Contact with and (suspected) exposure to covid-19: Secondary | ICD-10-CM | POA: Diagnosis not present

## 2020-03-15 DIAGNOSIS — Z01812 Encounter for preprocedural laboratory examination: Secondary | ICD-10-CM | POA: Diagnosis not present

## 2020-03-15 LAB — SARS CORONAVIRUS 2 (TAT 6-24 HRS): SARS Coronavirus 2: NEGATIVE

## 2020-03-17 ENCOUNTER — Ambulatory Visit
Admission: RE | Admit: 2020-03-17 | Discharge: 2020-03-17 | Disposition: A | Discharge status: Home / Self Care | Payer: Commercial Managed Care - PPO | Source: Ambulatory Visit | Attending: General Surgery | Admitting: General Surgery

## 2020-03-17 ENCOUNTER — Other Ambulatory Visit: Payer: Self-pay

## 2020-03-17 IMAGING — MG MM PLC BREAST LOC DEV 1ST LESION INC*R*
8 of 9 series · 8 of 9 positions shown · non-contrast
Comparison: Previous exam(s).

CLINICAL DATA: 59-year-old female for radioactive seed localization
of RIGHT breast LCIS prior to excision.

EXAM:
MAMMOGRAPHIC GUIDED RADIOACTIVE SEED LOCALIZATION OF THE RIGHT
BREAST

[R LM (1 of 5)]
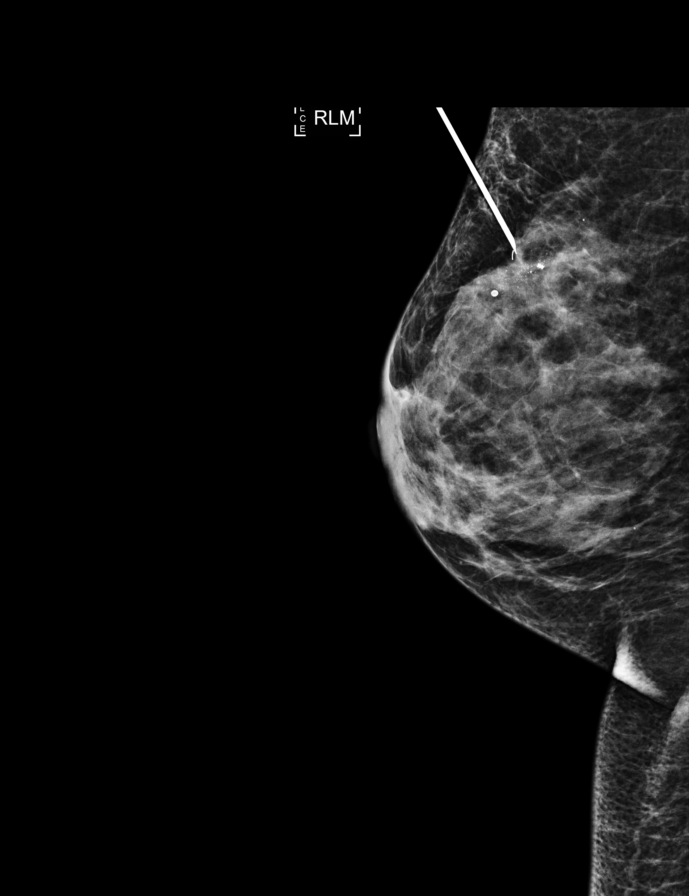

[R LM (2 of 5)]
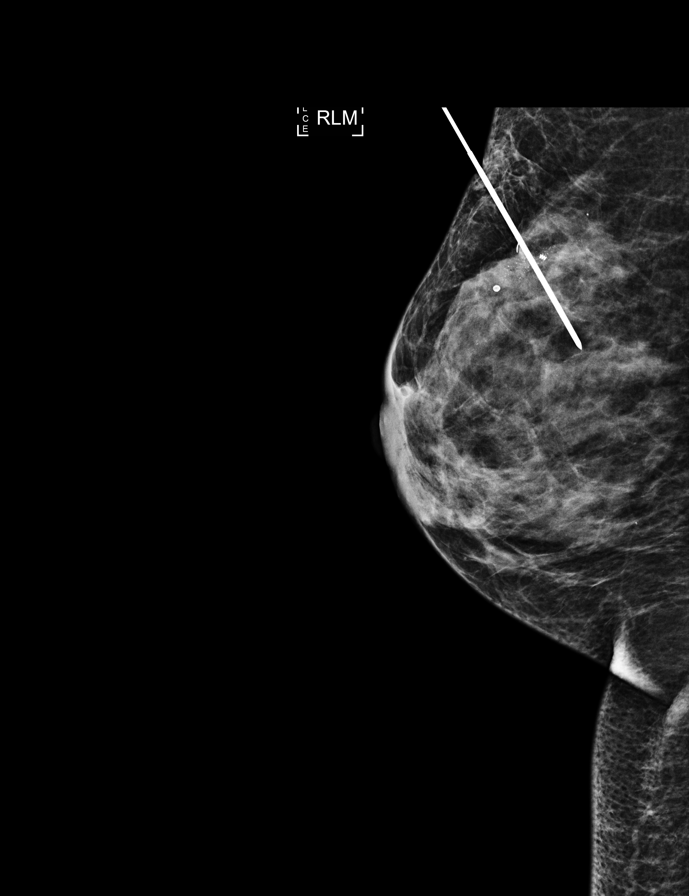

[R CC (1 of 3)]
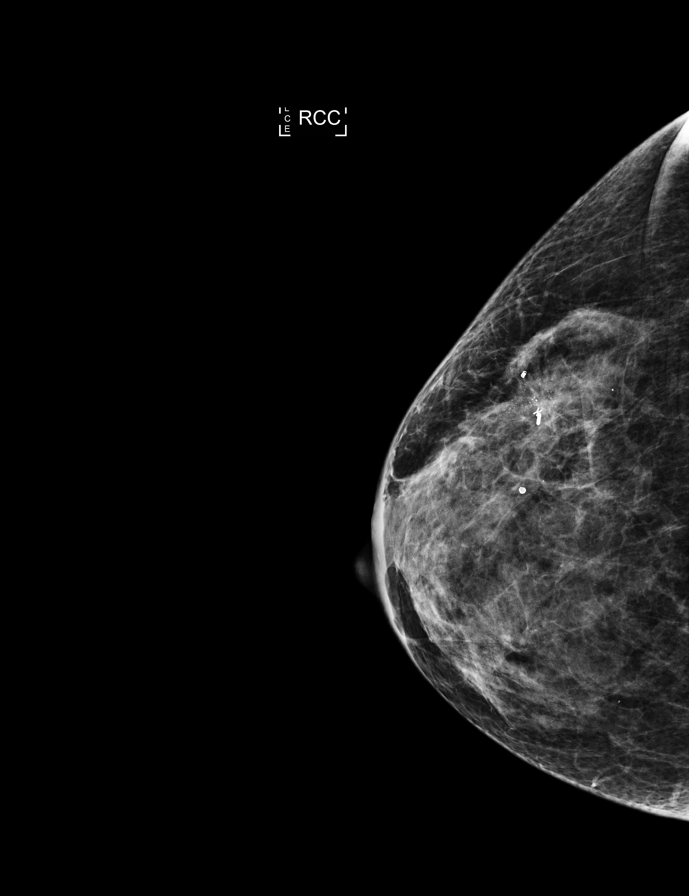

[R LM (3 of 5)]
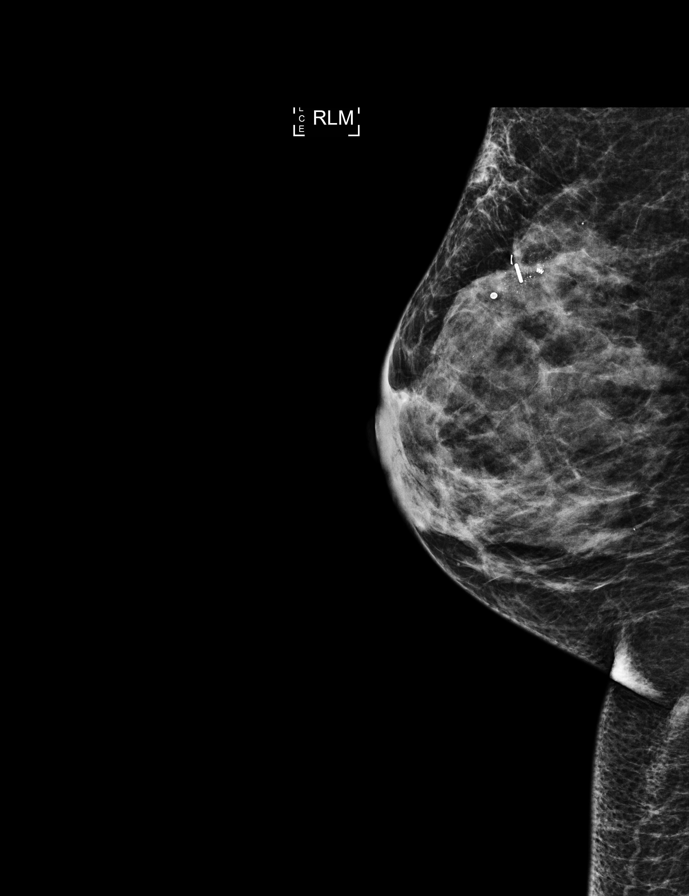

[R LM (4 of 5)]
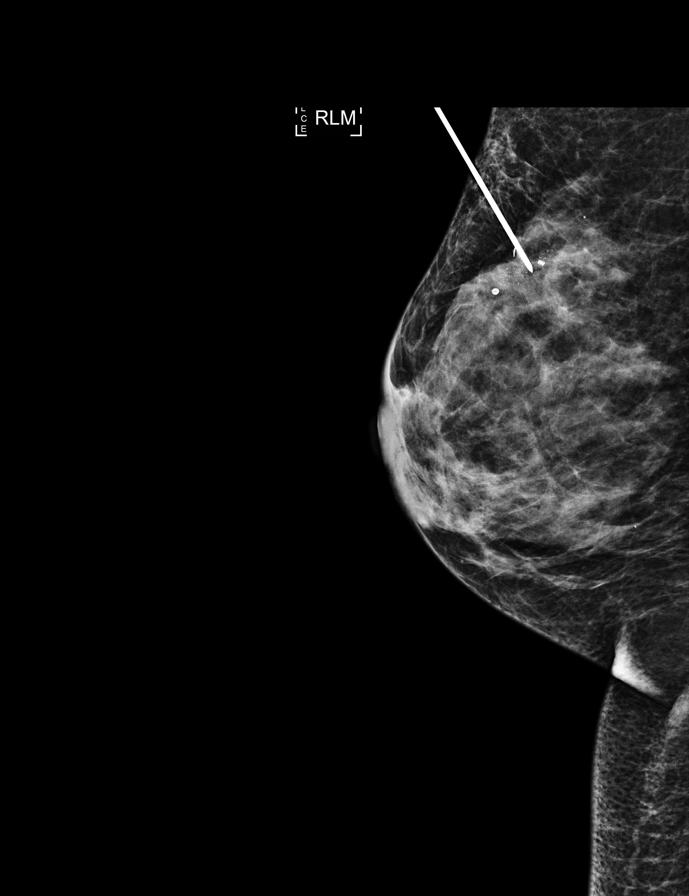

[R CC (2 of 3)]
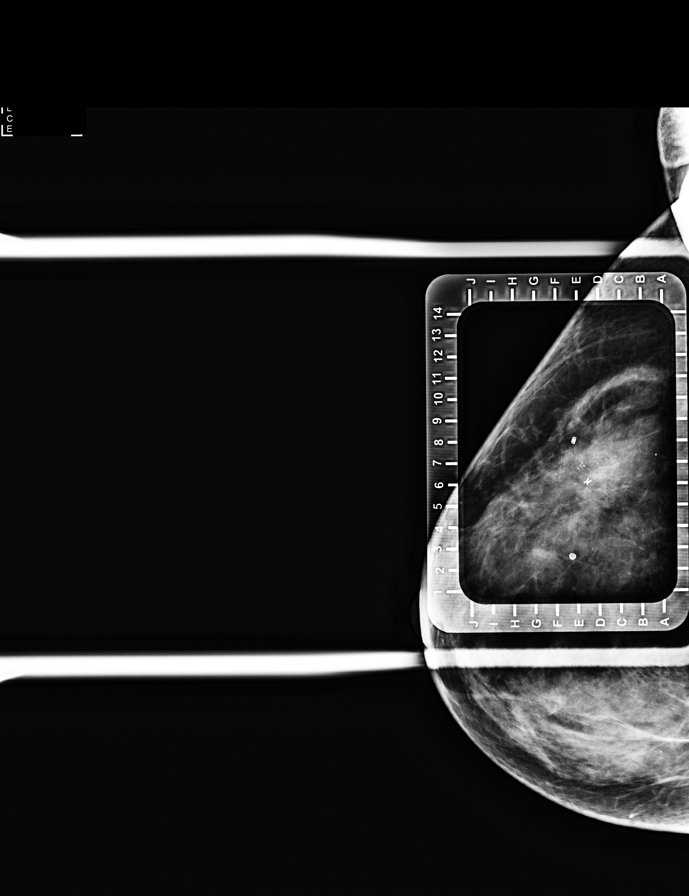

[R CC (3 of 3)]
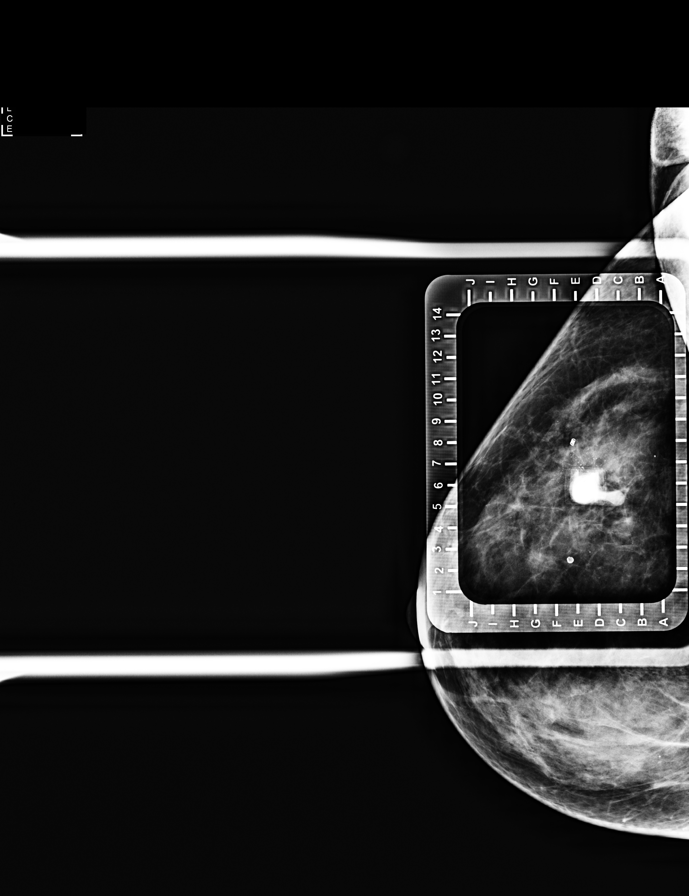

[R LM (5 of 5)]
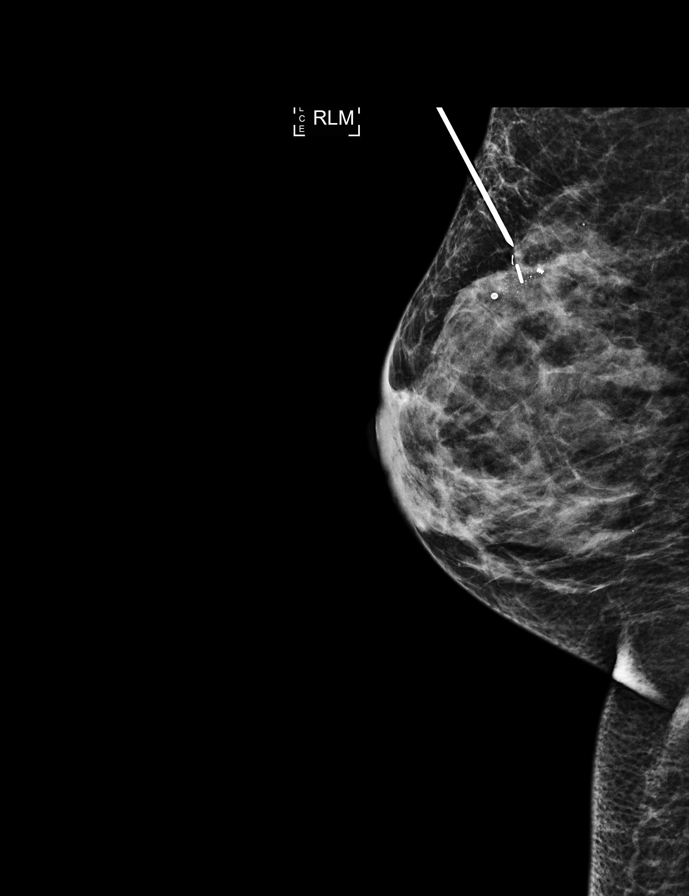

[8 of 9 positions shown; findings below may reference images not displayed]

FINDINGS: Patient presents for radioactive seed localization prior to RIGHT
breast excision. I met with the patient and we discussed the
procedure of seed localization including benefits and alternatives.
We discussed the high likelihood of a successful procedure. We
discussed the risks of the procedure including infection, bleeding,
tissue injury and further surgery. We discussed the low dose of
radioactivity involved in the procedure. Informed, written consent
was given.

The usual time-out protocol was performed immediately prior to the
procedure.

Using mammographic guidance, sterile technique, 1% lidocaine and an
[OD] radioactive seed, the X shaped clip was localized using a
SUPERIOR approach. The follow-up mammogram images confirm the seed
in the expected location and were marked for Dr. ISAURO.

Follow-up survey of the patient confirms presence of the radioactive
seed.

Order number of [OD] seed:  [PHONE_NUMBER].

Total activity:  0.253 millicuries.  Reference Date: [DATE].

The patient tolerated the procedure well and was released from the
[REDACTED]. She was given instructions regarding seed removal.
IMPRESSION: Radioactive seed localization RIGHT breast. No apparent
complications.

## 2020-03-17 NOTE — H&P (Signed)
[name] Appointment: 02/17/2020 3:00 PM Location: [facility] Patient #: [id number] DOB: November 03, 1960 Married / Language: [name] / Race: White Female   History of Present Illness [name] MD; 02/17/2020 3:47 PM) The patient is a 59 year old female who presents for a Follow-up for Breast cancer. Pt is a 59 yo F who was diagnosed with left breast cancer June 2019. She presented with a palpable left breast mass. She then had diagnostic imaging that showed several findings. She had a 1.4 cm mass at 7 o'clock and one at 7:30. There were also calcifications seen that were at 3 o'clock that were benign. She has no prior cancer history. She has no family history of cancer. She denies breast pain or nipple discharge. She feels well overall. She took HRT for 1 year in the form of a combipatch, but is no longer on it. She is a G2P0 wtih 2 ectopic pregnancies. menarche was age 48. She is a current smoker. She drinks around 5 drinks per week.   She underwent lumpectomy/SLN 02/21/18. Two margins were positive. She was taken for reexcision 7/24 and final margins were clear. She started radiation. She developed an infection and started having drainage. She required multiple courses of antibiotics and took a bit to heal. She has been on letrozole.   I saw her last month and her mammogram was scheduled for June. When she got mammogram, she had suspicious calcs in the upper outer quadrant of the right upper quadrant. Biopsy showed LCIS. She is here to discuss.    Dx mammogram/u/s /01/20/2020 Narrative & Impressio/n CLINICAL DATA: Patient presents for a bilateral diagnostic examination due to a history of a previous left malignant lumpectomy 2019.History of previous biopsy of right breast fibroadenoma 2014. EXAM: DIGITAL DIAGNOSTIC BILATERAL MAMMOGRAM WITH CAD AND TOMO COMPARISON: Previous exam(s). ACR Breast Density Category c: The breast tissue is heterogeneously  dense, which may obscure small masses. FINDINGS: Examination demonstrates stable post lumpectomy changes of the left breast. Remainder of the left breast is unchanged. Images of the right breast demonstrate a group of pleomorphic microcalcifications over the middle to posterior third of the upper-outer quadrant spanning approximately 3.6 cm. Remainder of the right breast is unchanged. Mammographic images were processed with CAD. IMPRESSION: 1. Stable post lumpectomy changes of the left breast. 2. Suspicious microcalcifications over the right upper outer quadrant spanning 3.6 cm. RECOMMENDATION: Recommend stereotactic core needle biopsy of the suspicious right breast microcalcifications. I have discussed the findings and recommendations with the patient. If applicable, a reminder letter will be sent to the patient regarding the next appointment. BI-RADS CATEGORY 4: Suspicious.   pathology 02/02/2020 LOBULAR CARCINOMA IN SITU WITH PLEOMORPHIC FEATURES AND CALCIFICATIONS, INVOLVING ADENOSIS.    Allergies (Tanisha A. Owens Shark, [place]; 02/17/2020 3:03 PM) No Known Allergies  [02/03/2019]: No Known Drug Allergies  [04/03/2018]: Allergies Reconciled   Medication History (Tanisha A. Owens Shark, [place]; 02/17/2020 3:03 PM) Levothyroxine Sodium (75MCG Tablet, Oral) Active. PARoxetine HCl (20MG  Tablet, Oral) Active. Halobetasol Propionate (0.05% Cream, External) Active. Letrozole (2.5MG  Tablet, Oral) Active. Tylenol (325MG  Tablet, Oral) Active. Fluconazole (150MG  Tablet, Oral) Active. Medications Reconciled    Review of Systems [name] MD; 02/17/2020 1:13 PM) All other systems negative  Vitals (Tanisha A. Brown RMA; 02/17/2020 3:04 PM) 02/17/2020 3:03 PM Weight: 122.4 lb Height: 62in Body Surface Area: 1.55 m Body Mass Index: 22.39 kg/m  Temp.: 98.76F  Pulse: 104 (Regular)  BP: 132/82(Sitting, Left Arm, Standard)       Physical Exam Dorris Fetch Barry Dienes  MD; 02/17/2020  3:48 PM) General Note: alert and oriented x 3. NAD.   Chest and Lung Exam Chest and lung exam reveals -quiet, even and easy respiratory effort with no use of accessory muscles. Inspection Chest Wall - Normal. Back - normal.  Breast Note: right breast with ptosis. thickening more prominent at this exam that last one in the [facility]. ? right LN, but 2 weeks after covid injection. left nipple with downward deviation. no palpable masses.     Assessment & Plan [name] MD; 02/17/2020 3:51 PM) PRIMARY CANCER OF LOWER-INNER QUADRANT OF LEFT FEMALE BREAST (C50.312) Impression: Has done everything to decrease risk of recurrent breast cancer including antihormonal tx.  Will plan bilateral mastectomies. LOBULAR CARCINOMA IN SITU (LCIS) OF RIGHT BREAST (D05.01) Impression: Would need to excise to make sure this is not cancer.  Given that it is only 2 years since original dx and last mammogram looked completely normal, will plan bilateral mastectomies. Will need SLN bx on this side. She has seen Dr. Iran Planas in the past regarding the left nipple deviation and would like to go back to see her.  Reviewed risks of surgery including bleeding, infection, pain, recovery, dissatisfaction with appearance, loss of reconstruction, and more. Discussed presence of drains and time in the hospital. Current Plans You are being scheduled for surgery- Our schedulers will call you.  You should hear from our office's scheduling department within 5 working days about the location, date, and time of surgery. We try to make accommodations for patient's preferences in scheduling surgery, but sometimes the OR schedule or the surgeon's schedule prevents [place] from making those accommodations.  If you have not heard from our office [phone]) in 5 working days, call the office and ask for your surgeon's nurse.  If you have other questions about your diagnosis, plan, or surgery, call the office and ask for your  surgeon's nurse.  Referred to Surgery - Plastic, for evaluation and follow up (Plastic Surgery). Routine. Pt Education - [facility] Mastectomy [facility]   Signed electronically by [name], MD (02/17/2020 3:52 PM)

## 2020-03-18 ENCOUNTER — Other Ambulatory Visit: Payer: Self-pay

## 2020-03-18 ENCOUNTER — Ambulatory Visit (HOSPITAL_COMMUNITY): Payer: Commercial Managed Care - PPO

## 2020-03-18 ENCOUNTER — Ambulatory Visit
Admission: RE | Admit: 2020-03-18 | Discharge: 2020-03-18 | Disposition: A | Discharge status: Home / Self Care | Payer: Commercial Managed Care - PPO | Source: Ambulatory Visit | Attending: General Surgery | Admitting: General Surgery

## 2020-03-18 ENCOUNTER — Encounter (HOSPITAL_COMMUNITY)
Admission: RE | Disposition: A | Discharge status: Home / Self Care | Payer: Self-pay | Source: Home / Self Care | Attending: General Surgery

## 2020-03-18 ENCOUNTER — Ambulatory Visit (HOSPITAL_COMMUNITY)
Admission: RE | Admit: 2020-03-18 | Discharge: 2020-03-18 | Disposition: A | Discharge status: Home / Self Care | Payer: Commercial Managed Care - PPO | Attending: General Surgery | Admitting: General Surgery

## 2020-03-18 ENCOUNTER — Encounter (HOSPITAL_COMMUNITY): Payer: Self-pay | Admitting: General Surgery

## 2020-03-18 ENCOUNTER — Ambulatory Visit (HOSPITAL_COMMUNITY): Payer: Commercial Managed Care - PPO | Admitting: Physician Assistant

## 2020-03-18 DIAGNOSIS — C50911 Malignant neoplasm of unspecified site of right female breast: Secondary | ICD-10-CM | POA: Diagnosis not present

## 2020-03-18 DIAGNOSIS — F17200 Nicotine dependence, unspecified, uncomplicated: Secondary | ICD-10-CM | POA: Diagnosis not present

## 2020-03-18 DIAGNOSIS — N6021 Fibroadenosis of right breast: Secondary | ICD-10-CM | POA: Insufficient documentation

## 2020-03-18 DIAGNOSIS — Z853 Personal history of malignant neoplasm of breast: Secondary | ICD-10-CM | POA: Insufficient documentation

## 2020-03-18 DIAGNOSIS — C50411 Malignant neoplasm of upper-outer quadrant of right female breast: Secondary | ICD-10-CM

## 2020-03-18 DIAGNOSIS — Z79899 Other long term (current) drug therapy: Secondary | ICD-10-CM | POA: Diagnosis not present

## 2020-03-18 DIAGNOSIS — E039 Hypothyroidism, unspecified: Secondary | ICD-10-CM | POA: Insufficient documentation

## 2020-03-18 DIAGNOSIS — Z79890 Hormone replacement therapy (postmenopausal): Secondary | ICD-10-CM | POA: Insufficient documentation

## 2020-03-18 HISTORY — PX: BREAST LUMPECTOMY WITH RADIOACTIVE SEED LOCALIZATION: SHX6424

## 2020-03-18 IMAGING — MG MM BREAST SURGICAL SPECIMEN
1 series · 1 of 1 positions shown · non-contrast
Comparison: Previous exam(s).

CLINICAL DATA: Status post seed localized RIGHT lumpectomy for
LCIS.

EXAM:
SPECIMEN RADIOGRAPH OF THE RIGHT BREAST

[R]
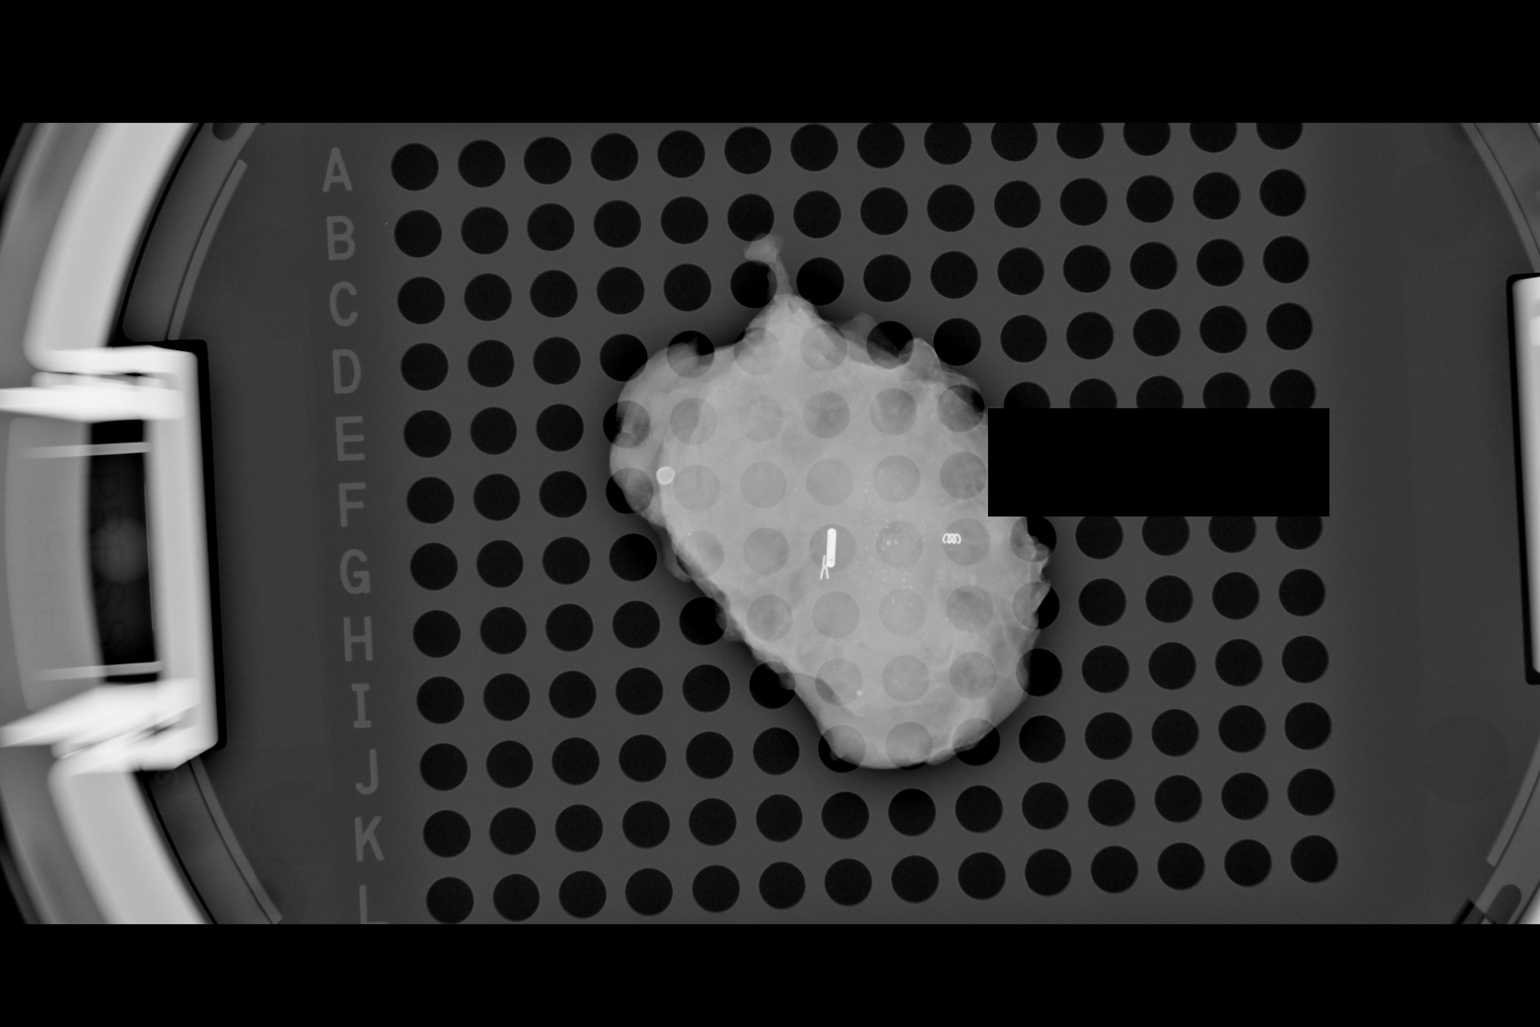

[1 of 1 positions shown; findings below may reference images not displayed]

FINDINGS: Status post excision of the right breast. The radioactive seed, X
shaped clip, and spiral shaped clip are present and marked for
pathology. The findings are discussed with Dr. ALPHOSE at the time of
interpretation.
IMPRESSION: Specimen radiograph of the right breast.

## 2020-03-18 SURGERY — BREAST LUMPECTOMY WITH RADIOACTIVE SEED LOCALIZATION
Anesthesia: General | Site: Breast | Laterality: Right

## 2020-03-18 MED ORDER — LACTATED RINGERS IV SOLN: INTRAVENOUS | Status: DC

## 2020-03-18 MED ORDER — PHENYLEPHRINE HCL-NACL 10-0.9 MG/250ML-% IV SOLN: INTRAVENOUS | Status: DC | PRN

## 2020-03-18 MED ORDER — LIDOCAINE-EPINEPHRINE 1 %-1:100000 IJ SOLN
INTRAMUSCULAR | Status: AC
  Filled 2020-03-18: qty 1

## 2020-03-18 MED ORDER — PROPOFOL 500 MG/50ML IV EMUL
INTRAVENOUS | Status: DC | PRN
Start: 2020-03-18 — End: 2020-03-18
  Administered 2020-03-18: 100 ug/kg/min via INTRAVENOUS

## 2020-03-18 MED ORDER — SCOPOLAMINE 1 MG/3DAYS TD PT72
1.0000 | MEDICATED_PATCH | TRANSDERMAL | Status: DC
  Administered 2020-03-18: 1.5 mg via TRANSDERMAL
  Filled 2020-03-18: qty 1

## 2020-03-18 MED ORDER — ONDANSETRON HCL 4 MG/2ML IJ SOLN
INTRAMUSCULAR | Status: AC
  Filled 2020-03-18: qty 2

## 2020-03-18 MED ORDER — CHLORHEXIDINE GLUCONATE 0.12 % MT SOLN
15.0000 mL | Freq: Once | OROMUCOSAL | Status: AC
  Administered 2020-03-18: 15 mL via OROMUCOSAL
  Filled 2020-03-18: qty 15

## 2020-03-18 MED ORDER — FENTANYL CITRATE (PF) 100 MCG/2ML IJ SOLN
INTRAMUSCULAR | Status: AC
  Filled 2020-03-18: qty 2

## 2020-03-18 MED ORDER — DEXAMETHASONE SODIUM PHOSPHATE 10 MG/ML IJ SOLN
INTRAMUSCULAR | Status: AC
  Filled 2020-03-18: qty 1

## 2020-03-18 MED ORDER — CEFAZOLIN SODIUM-DEXTROSE 2-4 GM/100ML-% IV SOLN
2.0000 g | INTRAVENOUS | Status: AC
  Administered 2020-03-18: 2 g via INTRAVENOUS
  Filled 2020-03-18: qty 100

## 2020-03-18 MED ORDER — ORAL CARE MOUTH RINSE: 15.0000 mL | Freq: Once | OROMUCOSAL | Status: AC

## 2020-03-18 MED ORDER — MIDAZOLAM HCL 2 MG/2ML IJ SOLN
INTRAMUSCULAR | Status: AC
  Filled 2020-03-18: qty 2

## 2020-03-18 MED ORDER — FENTANYL CITRATE (PF) 100 MCG/2ML IJ SOLN
INTRAMUSCULAR | Status: DC | PRN
  Administered 2020-03-18 (×2): 50 ug via INTRAVENOUS

## 2020-03-18 MED ORDER — CHLORHEXIDINE GLUCONATE CLOTH 2 % EX PADS: 6.0000 | MEDICATED_PAD | Freq: Once | CUTANEOUS | Status: DC

## 2020-03-18 MED ORDER — MIDAZOLAM HCL 5 MG/5ML IJ SOLN
INTRAMUSCULAR | Status: DC | PRN
  Administered 2020-03-18: 2 mg via INTRAVENOUS

## 2020-03-18 MED ORDER — ACETAMINOPHEN 500 MG PO TABS
1000.0000 mg | ORAL_TABLET | ORAL | Status: AC
  Administered 2020-03-18: 1000 mg via ORAL
  Filled 2020-03-18: qty 2

## 2020-03-18 MED ORDER — 0.9 % SODIUM CHLORIDE (POUR BTL) OPTIME
TOPICAL | Status: DC | PRN
  Administered 2020-03-18: 1000 mL

## 2020-03-18 MED ORDER — ENSURE PRE-SURGERY PO LIQD: 296.0000 mL | Freq: Once | ORAL | Status: DC

## 2020-03-18 MED ORDER — FENTANYL CITRATE (PF) 250 MCG/5ML IJ SOLN
INTRAMUSCULAR | Status: AC
  Filled 2020-03-18: qty 5

## 2020-03-18 MED ORDER — PROMETHAZINE HCL 25 MG/ML IJ SOLN: 6.2500 mg | INTRAMUSCULAR | Status: DC | PRN

## 2020-03-18 MED ORDER — PROPOFOL 10 MG/ML IV BOLUS
INTRAVENOUS | Status: DC | PRN
  Administered 2020-03-18 (×2): 50 mg via INTRAVENOUS
  Administered 2020-03-18: 100 mg via INTRAVENOUS
  Administered 2020-03-18: 50 mg via INTRAVENOUS

## 2020-03-18 MED ORDER — LACTATED RINGERS IV SOLN: INTRAVENOUS | Status: DC | PRN

## 2020-03-18 MED ORDER — FENTANYL CITRATE (PF) 100 MCG/2ML IJ SOLN: 25.0000 ug | INTRAMUSCULAR | Status: DC | PRN

## 2020-03-18 MED ORDER — LIDOCAINE HCL 1 % IJ SOLN
INTRAMUSCULAR | Status: DC | PRN
  Administered 2020-03-18: 10 mL via INTRAMUSCULAR

## 2020-03-18 MED ORDER — LIDOCAINE 2% (20 MG/ML) 5 ML SYRINGE
INTRAMUSCULAR | Status: DC | PRN
  Administered 2020-03-18: 60 mg via INTRAVENOUS

## 2020-03-18 MED ORDER — BUPIVACAINE HCL (PF) 0.25 % IJ SOLN
INTRAMUSCULAR | Status: AC
  Filled 2020-03-18: qty 30

## 2020-03-18 MED ORDER — ONDANSETRON HCL 4 MG/2ML IJ SOLN
INTRAMUSCULAR | Status: DC | PRN
  Administered 2020-03-18: 4 mg via INTRAVENOUS

## 2020-03-18 MED ORDER — PROPOFOL 10 MG/ML IV BOLUS
INTRAVENOUS | Status: AC
  Filled 2020-03-18: qty 20

## 2020-03-18 MED ORDER — DEXAMETHASONE SODIUM PHOSPHATE 10 MG/ML IJ SOLN
INTRAMUSCULAR | Status: DC | PRN
  Administered 2020-03-18: 5 mg via INTRAVENOUS

## 2020-03-18 SURGICAL SUPPLY — 42 items
BINDER BREAST LRG (GAUZE/BANDAGES/DRESSINGS) IMPLANT
BINDER BREAST XLRG (GAUZE/BANDAGES/DRESSINGS) IMPLANT
BLADE SURG 10 STRL SS (BLADE) ×3 IMPLANT
CANISTER SUCT 3000ML PPV (MISCELLANEOUS) IMPLANT
CHLORAPREP W/TINT 26 (MISCELLANEOUS) ×3 IMPLANT
CLIP VESOCCLUDE LG 6/CT (CLIP) ×3 IMPLANT
CLIP VESOCCLUDE MED 6/CT (CLIP) ×3 IMPLANT
CLOSURE STERI-STRIP 1/4X4 (GAUZE/BANDAGES/DRESSINGS) ×3 IMPLANT
CLOSURE WOUND 1/2 X4 (GAUZE/BANDAGES/DRESSINGS) ×1
COVER PROBE W GEL 5X96 (DRAPES) ×3 IMPLANT
COVER SURGICAL LIGHT HANDLE (MISCELLANEOUS) ×3 IMPLANT
COVER WAND RF STERILE (DRAPES) ×3 IMPLANT
DERMABOND ADVANCED (GAUZE/BANDAGES/DRESSINGS) ×2
DERMABOND ADVANCED .7 DNX12 (GAUZE/BANDAGES/DRESSINGS) ×1 IMPLANT
DEVICE DUBIN SPECIMEN MAMMOGRA (MISCELLANEOUS) ×3 IMPLANT
DRAPE CHEST BREAST 15X10 FENES (DRAPES) ×3 IMPLANT
DRSG PAD ABDOMINAL 8X10 ST (GAUZE/BANDAGES/DRESSINGS) ×3 IMPLANT
ELECT COATED BLADE 2.86 ST (ELECTRODE) ×3 IMPLANT
ELECT REM PT RETURN 9FT ADLT (ELECTROSURGICAL) ×3
ELECTRODE REM PT RTRN 9FT ADLT (ELECTROSURGICAL) ×1 IMPLANT
GAUZE SPONGE 4X4 12PLY STRL LF (GAUZE/BANDAGES/DRESSINGS) ×3 IMPLANT
GLOVE BIO SURGEON STRL SZ 6 (GLOVE) ×3 IMPLANT
GLOVE INDICATOR 6.5 STRL GRN (GLOVE) ×3 IMPLANT
GOWN STRL REUS W/ TWL LRG LVL3 (GOWN DISPOSABLE) ×1 IMPLANT
GOWN STRL REUS W/TWL 2XL LVL3 (GOWN DISPOSABLE) ×3 IMPLANT
GOWN STRL REUS W/TWL LRG LVL3 (GOWN DISPOSABLE) ×2
KIT BASIN OR (CUSTOM PROCEDURE TRAY) ×3 IMPLANT
KIT MARKER MARGIN INK (KITS) ×3 IMPLANT
LIGHT WAVEGUIDE WIDE FLAT (MISCELLANEOUS) IMPLANT
NEEDLE HYPO 25GX1X1/2 BEV (NEEDLE) ×3 IMPLANT
NS IRRIG 1000ML POUR BTL (IV SOLUTION) IMPLANT
PACK GENERAL/GYN (CUSTOM PROCEDURE TRAY) ×3 IMPLANT
STRIP CLOSURE SKIN 1/2X4 (GAUZE/BANDAGES/DRESSINGS) ×2 IMPLANT
SUT MNCRL AB 4-0 PS2 18 (SUTURE) ×3 IMPLANT
SUT SILK 2 0 SH (SUTURE) IMPLANT
SUT VIC AB 2-0 SH 27 (SUTURE) ×2
SUT VIC AB 2-0 SH 27XBRD (SUTURE) ×1 IMPLANT
SUT VIC AB 3-0 SH 27 (SUTURE) ×2
SUT VIC AB 3-0 SH 27X BRD (SUTURE) ×1 IMPLANT
SYR CONTROL 10ML LL (SYRINGE) ×3 IMPLANT
TOWEL GREEN STERILE (TOWEL DISPOSABLE) ×3 IMPLANT
TOWEL GREEN STERILE FF (TOWEL DISPOSABLE) ×3 IMPLANT

## 2020-03-18 NOTE — Discharge Instructions (Addendum)
[facility] °Office Phone Number [phone] ° °BREAST BIOPSY/ PARTIAL MASTECTOMY: POST OP INSTRUCTIONS ° °Always review your discharge instruction sheet given to you by the facility where your surgery was performed. ° °IF YOU HAVE DISABILITY OR FAMILY LEAVE FORMS, YOU MUST BRING THEM TO THE OFFICE FOR PROCESSING.  DO NOT GIVE THEM TO YOUR DOCTOR. ° °1. A prescription for pain medication may be given to you upon discharge.  Take your pain medication as prescribed, if needed.  If narcotic pain medicine is not needed, then you may take acetaminophen (Tylenol) or ibuprofen (Advil) as needed. °2. Take your usually prescribed medications unless otherwise directed °3. If you need a refill on your pain medication, please contact your pharmacy.  They will contact our office to request authorization.  Prescriptions will not be filled after 5pm or on week-ends. °4. You should eat very light the first 24 hours after surgery, such as soup, crackers, pudding, etc.  Resume your normal diet the day after surgery. °5. Most patients will experience some swelling and bruising in the breast.  Ice packs and a good support bra will help.  Swelling and bruising can take several days to resolve.  °6. It is common to experience some constipation if taking pain medication after surgery.  Increasing fluid intake and taking a stool softener will usually help or prevent this problem from occurring.  A mild laxative (Milk of Magnesia or Miralax) should be taken according to package directions if there are no bowel movements after 48 hours. °7. Unless discharge instructions indicate otherwise, you may remove your bandages 48 hours after surgery, and you may shower at that time.  You may have steri-strips (small skin tapes) in place directly over the incision.  These strips should be left on the skin for 7-10 days.   Any sutures or staples will be removed at the office during your follow-up visit. °8. ACTIVITIES:  You may resume  regular daily activities (gradually increasing) beginning the next day.  Wearing a good support bra or sports bra (or the breast binder) minimizes pain and swelling.  You may have sexual intercourse when it is comfortable. °a. You may drive when you no longer are taking prescription pain medication, you can comfortably wear a seatbelt, and you can safely maneuver your car and apply brakes. °b. RETURN TO WORK:  __________1 week_______________ °9. You should see your doctor in the office for a follow-up appointment approximately two weeks after your surgery.  Your doctor’s nurse will typically make your follow-up appointment when she calls you with your pathology report.  Expect your pathology report 2-3 business days after your surgery.  You may call to check if you do not hear from us after three days. ° ° °WHEN TO CALL YOUR DOCTOR: °1. Fever over 101.0 °2. Nausea and/or vomiting. °3. Extreme swelling or bruising. °4. Continued bleeding from incision. °5. Increased pain, redness, or drainage from the incision. ° °The clinic staff is available to answer your questions during regular business hours.  Please don’t hesitate to call and ask to speak to one of the nurses for clinical concerns.  If you have a medical emergency, go to the nearest emergency room or call 911.  A surgeon from [facility] is always on call at the hospital. ° °For further questions, please visit [email or web address]  ° °

## 2020-03-18 NOTE — Transfer of Care (Signed)
Immediate Anesthesia Transfer of Care Note  Patient: Chimamanda Siegfried  Procedure(s) Performed: RIGHT BREAST LUMPECTOMY WITH RADIOACTIVE SEED LOCALIZATION (Right Breast)  Patient Location: PACU  Anesthesia Type:General  Level of Consciousness: awake, alert  and oriented  Airway & Oxygen Therapy: Patient Spontanous Breathing  Post-op Assessment: Report given to RN and Post -op Vital signs reviewed and stable  Post vital signs: Reviewed and stable  Last Vitals:  Vitals Value Taken Time  BP    Temp    Pulse 65 03/18/20 1458  Resp 18 03/18/20 1458  SpO2 100 % 03/18/20 1458  Vitals shown include unvalidated device data.  Last Pain:  Vitals:   03/18/20 1203  TempSrc:   PainSc: 0-No pain         Complications: No complications documented.

## 2020-03-18 NOTE — Anesthesia Procedure Notes (Signed)
Procedure Name: LMA Insertion Date/Time: 03/18/2020 1:54 PM Performed by: Trinna Post., CRNA Pre-anesthesia Checklist: Patient identified, Emergency Drugs available, Suction available, Patient being monitored and Timeout performed Patient Re-evaluated:Patient Re-evaluated prior to induction Oxygen Delivery Method: Circle system utilized Preoxygenation: Pre-oxygenation with 100% oxygen Induction Type: IV induction Ventilation: Mask ventilation without difficulty LMA: LMA inserted LMA Size: 4.0 Number of attempts: 1 Placement Confirmation: positive ETCO2 and breath sounds checked- equal and bilateral Tube secured with: Tape Dental Injury: Teeth and Oropharynx as per pre-operative assessment

## 2020-03-18 NOTE — Anesthesia Postprocedure Evaluation (Signed)
Anesthesia Post Note  Patient: Martha [name]  Procedure(s) Performed: RIGHT BREAST LUMPECTOMY WITH RADIOACTIVE SEED LOCALIZATION (Right Breast)     Patient location during evaluation: PACU Anesthesia Type: General Level of consciousness: awake and alert Pain management: pain level controlled Vital Signs Assessment: post-procedure vital signs reviewed and stable Respiratory status: spontaneous breathing, nonlabored ventilation, respiratory function stable and patient connected to nasal cannula oxygen Cardiovascular status: blood pressure returned to baseline and stable Postop Assessment: no apparent nausea or vomiting Anesthetic complications: no   No complications documented.  Last Vitals:  Vitals:   03/18/20 1500 03/18/20 1515  BP:  112/81  Pulse:  59  Resp:  14  Temp: (!) 36.2 C (!) 36.2 C  SpO2:  100%    Last Pain:  Vitals:   03/18/20 1515  TempSrc:   PainSc: 0-No pain                 Catalina Gravel

## 2020-03-18 NOTE — Op Note (Signed)
Right Breast Radioactive seed localized excisional biopsy  Indications: This patient presents with history of abnormal right mammogram with discordant core needle biopsy.  Pt also has history of left breast cancer in 2019  Pre-operative Diagnosis: abnormal right mammogram    Post-operative Diagnosis: abnormal right mammogram  Surgeon: [name]   Anesthesia: General endotracheal anesthesia  ASA Class: 2  Procedure Details  The patient was seen in the Holding Room. The risks, benefits, complications, treatment options, and expected outcomes were discussed with the patient. The possibilities of bleeding, infection, the need for additional procedures, failure to diagnose a condition, and creating a complication requiring transfusion or operation were discussed with the patient. The patient concurred with the proposed plan, giving informed consent.  The site of surgery properly noted/marked. The patient was taken to Operating Room # 2, identified, and the procedure verified as Right Breast seed localized excisional biopsy. A Time Out was held and the above information confirmed.  The right breast and chest were prepped and draped in standard fashion. A lateral circumareolar incision was made near the previously placed radioactive seed.  Dissection was carried down around the point of maximum signal intensity. The cautery was used to perform the dissection.   The specimen was inked with the margin marker paint kit.    Specimen radiography confirmed inclusion of the mammographic lesion, the clip, and the seed.  The background signal in the breast was zero. One clip was placed in the breast cavity.  Hemostasis was achieved with cautery.  The wound was irrigated and closed with 3-0 vicryl interrupted deep dermal sutures and 4-0 monocryl running subcuticular suture.      Sterile dressings were applied. At the end of the operation, all sponge, instrument, and needle counts were  correct.  Findings: Seed, clip in specimen.  Posterior margin is pectoralis and anterior margin is skin.   Estimated Blood Loss:  min         Specimens: right breast tissue with seed         Complications:  None; patient tolerated the procedure well.         Disposition: PACU - hemodynamically stable.         Condition: stable

## 2020-03-18 NOTE — Interval H&P Note (Signed)
History and Physical Interval Note:  03/18/2020 11:52 AM  Martha [name]  has presented today for surgery, with the diagnosis of RIGHT BREAST CANCER.  The various methods of treatment have been discussed with the patient and family. After consideration of risks, benefits and other options for treatment, the patient has consented to  Procedure(s): RIGHT BREAST LUMPECTOMY WITH RADIOACTIVE SEED LOCALIZATION (Right) as a surgical intervention.  The patient's history has been reviewed, patient examined, no change in status, stable for surgery.  I have reviewed the patient's chart and labs.  Questions were answered to the patient's satisfaction.     [name]

## 2020-03-19 ENCOUNTER — Encounter (HOSPITAL_COMMUNITY): Payer: Self-pay | Admitting: General Surgery

## 2020-03-24 LAB — SURGICAL PATHOLOGY

## 2020-04-20 ENCOUNTER — Other Ambulatory Visit: Payer: Self-pay | Admitting: Family Medicine

## 2020-04-23 ENCOUNTER — Telehealth: Payer: Self-pay | Admitting: Hematology

## 2020-04-23 NOTE — Telephone Encounter (Signed)
Scheduled appt per 8/26 sch msg - pt is aware of apt date and time

## 2020-05-13 ENCOUNTER — Other Ambulatory Visit: Payer: Self-pay | Admitting: Family Medicine

## 2020-05-13 NOTE — Telephone Encounter (Signed)
LAST APPOINTMENT DATE: 02/04/2019   NEXT APPOINTMENT DATE: Visit date not found    LAST REFILL: 04/20/2020  QTY: 90

## 2020-05-14 MED ORDER — PAROXETINE HCL 20 MG PO TABS: 20.0000 mg | ORAL_TABLET | Freq: Every day | ORAL | 0 refills | Status: DC

## 2020-05-19 NOTE — Progress Notes (Signed)
[place]   Telephone:([phone] Fax:(336) [phone]   Clinic Follow up Note   Patient Care Team: [name], MD as PCP - General (Family Medicine) [name], MD as Consulting Physician (General Surgery) Truitt Merle, MD as Consulting Physician (Hematology) Eppie Gibson, MD as Attending Physician (Radiation Oncology) Alla Feeling, NP as Nurse Practitioner (Nurse Practitioner)  Date of Service:  05/21/2020  CHIEF COMPLAINT: F/u of left breast cancer and Right breast LCIS  SUMMARY OF ONCOLOGIC HISTORY: Oncology History Overview Note  Cancer Staging Malignant neoplasm of lower-inner quadrant of left breast in female, estrogen receptor positive ([place]) Staging form: Breast, AJCC 8th Edition - Clinical stage from 01/16/2018: Stage IA (cT1c, cN0, cM0, G2, ER+, PR+, HER2-) - Signed by Truitt Merle, MD on 01/23/2018 - Pathologic: Stage IA (pT1c, pN1, cM0, G1, ER+, PR+, HER2-) - Signed by Eppie Gibson, MD on 04/09/2018     Malignant neoplasm of lower-inner quadrant of left breast in female, estrogen receptor positive ([place])  01/15/2018 Mammogram   Diagnositc Mammogram 01/15/18  IMPRESSION: 1. Suspicious 1.2 x 1.4 x 1.3 cm mixed echogenicity mass left breast 7 o'clock position retroareolar location at the site of palpable concern.. 2. Indeterminate Within the left breast 7:30 o'clock retroareolar location, adjacent to the palpable mass, is a 0.5 x 0.4 x 0.5 cm oval circumscribed hypoechoic mass. 3. Indeterminate calcifications within the lateral left breast. Location of these calcifications is not definitely confirmed on the true lateral view.    01/16/2018 Initial Biopsy   Diagnosis 01/16/18 1. Breast, left, needle core biopsy, 7:30 o'clock (ribbon clip) - FIBROCYSTIC CHANGES WITH SCLEROSING ADENOSIS AND CALCIFICATIONS. - FIBROADENOMATOID CHANGE. - NO MALIGNANCY IDENTIFIED. 2. Breast, left, needle core biopsy, 7 o'clock position (coil clip) - INVASIVE MAMMARY  CARCINOMA, MSBR GRADE I/II. - SEE MICROSCOPIC DESCRIPTION Microscopic Comment  ADDENDUM: Immunohistochemistry for E-Cadherin is strongly positive in the tumor consistent with ductal carcinoma. (JDP:ah 01/17/18)   01/16/2018 Receptors her2   Estrogen Receptor: 100%, POSITIVE, STRONG STAINING INTENSITY Progesterone Receptor: 50%, POSITIVE, STRONG STAINING INTENSITY Proliferation Marker Ki67: 20% HER2 Negative   01/16/2018 Cancer Staging   Staging form: Breast, AJCC 8th Edition - Clinical stage from 01/16/2018: Stage IA (cT1c, cN0, cM0, G2, ER+, PR+, HER2-) - Signed by Truitt Merle, MD on 01/23/2018   01/22/2018 Initial Diagnosis   Malignant neoplasm of lower-inner quadrant of left breast in female, estrogen receptor positive ([place])   02/21/2018 Surgery    LEFT BREAST LUMPECTOMY WITH AXILLARY LYMPH NODE BIOPSY by Dr. Barry Dienes  02/21/18   02/21/2018 Pathology Results   Diagnosis 02/21/18 1. Breast, lumpectomy, Left - INVASIVE DUCTAL CARCINOMA, GRADE I, 1.6 CM. - DUCTAL CARCINOMA IN SITU, INTERMEDIATE NUCLEAR GRADE. - ANTERIOR AND MEDIAL RESECTION MARGINS ARE POSITIVE FOR CARCINOMA. - NEGATIVE FOR LYMPHOVASCULAR OR PERINEURAL INVASION. - BACKGROUND BREAST TISSUE WITH FIBROCYSTIC CHANGE, INCLUDING SCLEROSING ADENOSIS. - BIOPSY SITE CHANGES. - SEE ONCOLOGY TABLE. 2. Lymph node, sentinel, biopsy, Left Axillary #1 - METASTATIC BREAST CARCINOMA TO A LYMPH NODE, 1.0 CM IN GREATEST DIMENSION, WITH EXTRANODAL EXTENSION (1/1). 3. Lymph node, sentinel, biopsy, Left Axillary #2 - LYMPH NODE, NEGATIVE FOR CARCINOMA (0/1).    02/21/2018 Miscellaneous   Mammaprint 02/21/18 Low Risk with 10-year risk of recurrnce at 10% -No potential signifcant chemotherapy benefit   03/20/2018 Pathology Results   RE-EXCISION OF BREAST LUMPECTOMY by Dr. Barry Dienes  Diagnosis 03/20/18 1. Breast, excision, Left new anterior margin - FIBROCYSTIC CHANGES WITH ADENOSIS AND CALCIFICATIONS. - HEALING BIOPSY SITE. - THERE IS NO  EVIDENCE OF MALIGNANCY. 2. Breast, excision, Left new medial margin - FIBROCYSTIC CHANGES WITH ADENOSIS AND CALCIFICATIONS. - HEALING BIOPSY SITE. - THERE IS NO EVIDENCE OF MALIGNANCY. Microscopic Comment 1. -2. The surgical resection margin(s) of the specimen were inked and microscopically evaluated. (JBK:kh 03-22-18)   04/09/2018 Cancer Staging   Staging form: Breast, AJCC 8th Edition - Pathologic: Stage IA (pT1c, pN1, cM0, G1, ER+, PR+, HER2-) - Signed by Eppie Gibson, MD on 04/09/2018   04/22/2018 - 06/03/2018 Radiation Therapy   Radaiton with Dr. Isidore Moos 04/22/18-06/03/18   05/2018 -  Anti-estrogen oral therapy   Letrozole 2.97m started 05/2018    Survivorship   Per LCira Rue NP    Lobular carcinoma in situ (LCIS) of right breast  01/20/2020 Mammogram   Diagnostic Mammogram 01/20/20 IMPRESSION: 1.  Stable post lumpectomy changes of the left breast.   2. Suspicious microcalcifications over the right upper outer quadrant spanning 3.6 cm.   02/02/2020 Initial Biopsy   Diagnosis 02/02/20 Breast, right, needle core biopsy, upper outer quadrant, x clip - LOBULAR CARCINOMA IN SITU WITH PLEOMORPHIC FEATURES AND CALCIFICATIONS, INVOLVING ADENOSIS. SEE NOTE Diagnosis Note Immunohistochemical stain for E-cadherin is negative in the lesional cells, consistent with a lobular phenotype. Immunostains for p63, SMM 1 and calponin do not show evidence of invasive carcinoma.    02/04/2020 Initial Diagnosis   Lobular carcinoma in situ (LCIS) of right breast   03/18/2020 Surgery   RIGHT BREAST LUMPECTOMY WITH RADIOACTIVE SEED LOCALIZATION by Dr BAlessandra Bevels   03/18/2020 Pathology Results   FINAL MICROSCOPIC DIAGNOSIS:   A. BREAST, RIGHT, LUMPECTOMY:  - Pleomorphic lobular carcinoma in situ with calcifications and  underlying complex sclerosing lesion, adenosis and fibroadenomatoid  change.  - Margins of resection are not involved (Closest margins: < 1 mm,  anterior, posterior, inferior and  medial).  - Biopsy site.    COMMENT:   P63, Calponin and SMM-1 demonstrate the presence of myoepithelium in the  select focus.       CURRENT THERAPY:  Letrozole2.565mtartedin10/2019  INTERVAL HISTORY:  [name] here for a follow up of left breast cancer. She presents to the clinic alone. She notes her right breast surgery went well. She denies any concerns. She notes she drinks at least 1 light beer a day. She notes she is still smoking as well. She has been a lifetime smoking 1/2-1 ppd.    REVIEW OF SYSTEMS:   Constitutional: Denies fevers, chills or abnormal weight loss Eyes: Denies blurriness of vision Ears, nose, mouth, throat, and face: Denies mucositis or sore throat Respiratory: Denies cough, dyspnea or wheezes Cardiovascular: Denies palpitation, chest discomfort or lower extremity swelling Gastrointestinal:  Denies nausea, heartburn or change in bowel habits Skin: Denies abnormal skin rashes Lymphatics: Denies new lymphadenopathy or easy bruising Neurological:Denies numbness, tingling or new weaknesses Behavioral/Psych: Mood is stable, no new changes  All other systems were reviewed with the patient and are negative.  MEDICAL HISTORY:  Past Medical History:  [facility]  [facility]  . [facility]   [place] [facility]  . Dysrhythmia    SVT, s/p ablation ~ 2012 in at [facility]. History of radiation therapy 04/22/18-06/03/18   Left Breast, left SCV, axilla 50 Gy in 25 fractions, Left breast boost 10 Gy in 5 fractions.   . Personal history of radiation therapy   . PONV (postoperative nausea and vomiting)   . Thyroid disease     SURGICAL HISTORY:  Past Surgical History:  Procedure Laterality Date  . APPENDECTOMY    . BREAST BIOPSY Right 2014   fibroadenoma  . BREAST EXCISIONAL BIOPSY Left 1990   benign  . BREAST LUMPECTOMY Left   . BREAST LUMPECTOMY WITH AXILLARY LYMPH NODE BIOPSY Left 02/21/2018   Procedure:  LEFT BREAST LUMPECTOMY WITH AXILLARY LYMPH NODE BIOPSY;  Surgeon: [name], MD;  Location: [facility] [facility];  Service: General;  Laterality: Left;  . BREAST LUMPECTOMY WITH RADIOACTIVE SEED LOCALIZATION Right 03/18/2020   Procedure: RIGHT BREAST LUMPECTOMY WITH RADIOACTIVE SEED LOCALIZATION;  Surgeon: [name], MD;  Location: [place];  Service: General;  Laterality: Right;  . BREAST SURGERY    . EYE SURGERY    . GLAUCOMA SURGERY Bilateral   . RE-EXCISION OF BREAST LUMPECTOMY Left 03/20/2018   Procedure: RE-EXCISION OF BREAST LUMPECTOMY;  Surgeon: [name], MD;  Location: [place];  Service: General;  Laterality: Left;    I have reviewed the social history and family history with the patient and they are unchanged from previous note.  ALLERGIES:  has No Known Allergies.  MEDICATIONS:  Current Outpatient Medications  Medication Sig Dispense Refill  . letrozole (FEMARA) 2.5 MG tablet TAKE 1 TABLET BY MOUTH EVERY DAY 90 tablet 1  . levothyroxine (SYNTHROID) 75 MCG tablet TAKE 1 TABLET (75 MCG TOTAL) BY MOUTH DAILY BEFORE BREAKFAST. 90 tablet 1  . OVER THE COUNTER MEDICATION Take 1 tablet by mouth daily. Protandim Supplement    . PARoxetine (PAXIL) 20 MG tablet Take 1 tablet (20 mg total) by mouth daily. as directed 90 tablet 0  . Probiotic Product (PROBIOTIC PO) Take 1 capsule by mouth daily.    Marland Kitchen triamcinolone ointment (KENALOG) 0.5 % Apply 1 application topically 2 (two) times daily. (Patient not taking: Reported on 03/05/2020) 30 g 0   No current facility-administered medications for this visit.    PHYSICAL EXAMINATION: ECOG PERFORMANCE STATUS: 0 - Asymptomatic  Vitals:   05/21/20 1405  BP: 121/65  Pulse: 90  Resp: 17  Temp: 98.6 F (37 C)  SpO2: 99%   Filed Weights   05/21/20 1405  Weight: 120 lb (54.4 kg)    GENERAL:alert, no distress and comfortable SKIN: skin color, texture, turgor are normal, no rashes or significant lesions EYES: normal, Conjunctiva  are pink and non-injected, sclera clear  NECK: supple, thyroid normal size, non-tender, without nodularity LYMPH:  no palpable lymphadenopathy in the cervical, axillary  LUNGS: clear to auscultation and percussion with normal breathing effort HEART: regular rate & rhythm and no murmurs and no lower extremity edema ABDOMEN:abdomen soft, non-tender and normal bowel sounds Musculoskeletal:no cyanosis of digits and no clubbing  NEURO: alert & oriented x 3 with fluent speech, no focal motor/sensory deficits  LABORATORY DATA:  I have reviewed the data as listed CBC Latest Ref Rng & Units 05/21/2020 03/10/2020 06/03/2018  WBC 4.0 - 10.5 K/uL 6.8 6.8 6.3  Hemoglobin 12.0 - 15.0 g/dL 14.6 15.5(H) 14.7  Hematocrit 36 - 46 % 41.8 45.9 43.0  Platelets 150 - 400 K/uL 193 288 204     CMP Latest Ref Rng & Units 05/21/2020 03/10/2020 06/03/2018  Glucose 70 - 99 mg/dL 98 112(H) 99  BUN 6 - 20 mg/dL 13 11 10   Creatinine 0.44 - 1.00 mg/dL 0.74 0.62 0.69  Sodium 135 - 145 mmol/L 138 138 140  Potassium 3.5 - 5.1 mmol/L 4.0 4.2 3.9  Chloride 98 - 111 mmol/L 102 103 101  CO2 22 - 32 mmol/L  31 21(L) 29  Calcium 8.9 - 10.3 mg/dL 9.6 9.5 10.0  Total Protein 6.5 - 8.1 g/dL 7.2 7.0 7.6  Total Bilirubin 0.3 - 1.2 mg/dL 0.6 0.5 0.8  Alkaline Phos 38 - 126 U/L 96 81 86  AST 15 - 41 U/L 16 20 18   ALT 0 - 44 U/L 16 18 12       RADIOGRAPHIC STUDIES: I have personally reviewed the radiological images as listed and agreed with the findings in the report. No results found.   ASSESSMENT & PLAN:  Mckaylee Dimalanta is a 59 y.o. female with    1. Right breast LCIS, pleomorphic -She was diagnosed in 01/2020 with 3.6cm area of calcification in her right breast which her biopsy found to be Lobular carcinoma in situ, pleomorphic. This is a precancerous mass, that is different from her first breast cancer and makes her high risk for future breast cancer.   -She underwent Right breast lumpectomy with Dr Barry Dienes on 03/18/20. Her  surgical path confined Pleomorphic lobular carcinoma in situ with negative margins.  -I discussed her LCIS was completely removed by surgery.  Any form of adjuvant therapy is preventive to reduce risk of future breast cancer.  -Radiation was not recommended unless she is found to have DCIS or invasive disease.  -She will continue antiestrogen therapy with Letrozole to reduce her risk of recurrence.  -I discussed closer cancer surveillance. She will continue annual screening mammogram, self exams, and a routine office visit with lab and exam with [place]. I discussed additional screening with annual breast MRIs. She is agreeable. -She is clinically doing well and has recovered from surgery well. Labs reviewed, CBC and CMP WNL. General Physical exam normal.  -Plan for Breast MRI in 06/2020 and next mammogram in 12/2020.  -F/u in 1 year with NP Lacie and f/u with Dr Barry Dienes in 6 months.    2. Left breast invasive ductal carcinoma, stage IA,pT1cN1M0, ER/PR Positive, HER2 Negative, Grade I, Mammaprint low risk -She was diagnosed in 12/2017. She is s/p left breastlumpectomyand adjuvant radiation.  -She started anti-estrogen therapywith letrozole on 05/2018. Tolerating well with no issues.  3. Smoking Cessation, Alcohol Use, Cancer Screening  -She has been smoking for 40 years 1 pack a day. She is eligible for lung cancer screenings, I will order CT chest to be done in 2021. She is agreeable.  -She drinks at least 1 light beer daily. I again recommendedshe cut down on her alcohol intake. She is willing to do this.  -She does not have Gyn but follows her PCP. She notes she is overdue with Pap smear, I encouraged her to proceed.  -Per pt her last colonoscopy was at age 12.   35. Hypothyroidism -New medication and follow-up with PCP  5. Bone Health  -DEXA from 07/2018 was normal with lowest T-score of -0.8 at left hip. -We discussed that letrozole may cause osteopenia and osteoporosis. Will monitor     PLAN: -CT chest for lung cancer screening and screening MRI breast in 06/2020 at [facility] in 12/2020 -Continue Letrozole  -Lab and F/u with NP Lacie in 1 year, she will see Dr. Barry Dienes in 6 months    No problem-specific Assessment & Plan notes found for this encounter.   Orders Placed This Encounter  Procedures  . MR BREAST BILATERAL W WO CONTRAST INC CAD    REQUESTED DATE 07/17/2020  [employer] Epic ORDER PF: 01/20/2020 @ [facility] NO CYCLE WT: 120  HT: 5'2 NO NEEDS/NO CLAUS/NO METAL IN  EYES/NO IMPLANTS/NO GLUCOSE MONITOR, SPINAL STIMULATOR, OR INJECTORS/NO BULLETS OR BB'S IN BODY/NO BRAIN, HEART (ABLATION), EYE OR EAR SX/PREV BREAST SX 2019 & 2021/NO TO COVID/[facility] W/ PT/ORDER CHECKED 05/21/2020 [facility]     Standing Status:   Future    Standing Expiration Date:   05/21/2021    Order Specific Question:   If indicated for the ordered procedure, I authorize the administration of contrast media per Radiology protocol    Answer:   Yes    Order Specific Question:   What is the patient's sedation requirement?    Answer:   No Sedation    Order Specific Question:   Does the patient have a pacemaker or implanted devices?    Answer:   No    Order Specific Question:   Radiology Contrast Protocol - do NOT remove file path    Answer:   [email or web address]    Order Specific Question:   Preferred imaging location?    Answer:   [facility] (table limit-550lbs)  . CT Chest Wo Contrast    Standing Status:   Future    Standing Expiration Date:   05/21/2021    Order Specific Question:   Is patient pregnant?    Answer:   No    Order Specific Question:   Preferred imaging location?    Answer:   [facility]    Order Specific Question:   Radiology Contrast Protocol - do NOT remove file path    Answer:   [email or web address]  . MM DIAG BREAST TOMO BILATERAL    Standing Status:   Future    Standing Expiration Date:    05/21/2021    Order Specific Question:   Reason for Exam (SYMPTOM  OR DIAGNOSIS REQUIRED)    Answer:   screening    Order Specific Question:   Preferred imaging location?    Answer:   [facility]    Order Specific Question:   Release to patient    Answer:   Immediate    Order Specific Question:   Is the patient pregnant?    Answer:   Yes   All questions were answered. The patient knows to call the clinic with any problems, questions or concerns. No barriers to learning was detected. The total time spent in the appointment was 30 minutes.     Truitt Merle, MD 05/21/2020   I, Joslyn Devon, am acting as scribe for Truitt Merle, MD.   I have reviewed the above documentation for accuracy and completeness, and I agree with the above.

## 2020-05-21 ENCOUNTER — Other Ambulatory Visit: Payer: Self-pay

## 2020-05-21 ENCOUNTER — Inpatient Hospital Stay: Payer: Commercial Managed Care - PPO

## 2020-05-21 ENCOUNTER — Inpatient Hospital Stay: Payer: Commercial Managed Care - PPO | Attending: Hematology | Admitting: Hematology

## 2020-05-21 ENCOUNTER — Encounter: Payer: Self-pay | Admitting: Hematology

## 2020-05-21 VITALS — BP 121/65 | HR 90 | Temp 98.6°F | Resp 17 | Ht 62.5 in | Wt 120.0 lb

## 2020-05-21 DIAGNOSIS — Z79811 Long term (current) use of aromatase inhibitors: Secondary | ICD-10-CM | POA: Diagnosis not present

## 2020-05-21 DIAGNOSIS — C50312 Malignant neoplasm of lower-inner quadrant of left female breast: Secondary | ICD-10-CM

## 2020-05-21 DIAGNOSIS — Z79899 Other long term (current) drug therapy: Secondary | ICD-10-CM | POA: Diagnosis not present

## 2020-05-21 DIAGNOSIS — Z170 Estrogen receptor positive status [ER+]: Secondary | ICD-10-CM | POA: Insufficient documentation

## 2020-05-21 DIAGNOSIS — D0501 Lobular carcinoma in situ of right breast: Secondary | ICD-10-CM | POA: Insufficient documentation

## 2020-05-21 DIAGNOSIS — E039 Hypothyroidism, unspecified: Secondary | ICD-10-CM | POA: Diagnosis not present

## 2020-05-21 DIAGNOSIS — Z923 Personal history of irradiation: Secondary | ICD-10-CM | POA: Diagnosis not present

## 2020-05-21 DIAGNOSIS — F17210 Nicotine dependence, cigarettes, uncomplicated: Secondary | ICD-10-CM | POA: Diagnosis not present

## 2020-05-21 LAB — CMP (CANCER CENTER ONLY)
ALT: 16 U/L (ref 0–44)
AST: 16 U/L (ref 15–41)
Albumin: 4 g/dL (ref 3.5–5.0)
Alkaline Phosphatase: 96 U/L (ref 38–126)
Anion gap: 5 (ref 5–15)
BUN: 13 mg/dL (ref 6–20)
CO2: 31 mmol/L (ref 22–32)
Calcium: 9.6 mg/dL (ref 8.9–10.3)
Chloride: 102 mmol/L (ref 98–111)
Creatinine: 0.74 mg/dL (ref 0.44–1.00)
GFR, Est AFR Am: >60 mL/min (ref >60)
GFR, Estimated: >60 mL/min (ref >60)
Glucose, Bld: 98 mg/dL (ref 70–99)
Potassium: 4 mmol/L (ref 3.5–5.1)
Sodium: 138 mmol/L (ref 135–145)
Total Bilirubin: 0.6 mg/dL (ref 0.3–1.2)
Total Protein: 7.2 g/dL (ref 6.5–8.1)

## 2020-05-21 LAB — CBC WITH DIFFERENTIAL (CANCER CENTER ONLY)
Abs Immature Granulocytes: 0.02 10*3/uL (ref 0.00–0.07)
Basophils Absolute: 0.1 10*3/uL (ref 0.0–0.1)
Basophils Relative: 1 %
Eosinophils Absolute: 0 10*3/uL (ref 0.0–0.5)
Eosinophils Relative: 1 %
HCT: 41.8 % (ref 36.0–46.0)
Hemoglobin: 14.6 g/dL (ref 12.0–15.0)
Immature Granulocytes: 0 %
Lymphocytes Relative: 15 %
Lymphs Abs: 1 10*3/uL (ref 0.7–4.0)
MCH: 33.9 pg (ref 26.0–34.0)
MCHC: 34.9 g/dL (ref 30.0–36.0)
MCV: 97 fL (ref 80.0–100.0)
Monocytes Absolute: 0.5 10*3/uL (ref 0.1–1.0)
Monocytes Relative: 8 %
Neutro Abs: 5.2 10*3/uL (ref 1.7–7.7)
Neutrophils Relative %: 75 %
Platelet Count: 193 10*3/uL (ref 150–400)
RBC: 4.31 MIL/uL (ref 3.87–5.11)
RDW: 13.6 % (ref 11.5–15.5)
WBC Count: 6.8 10*3/uL (ref 4.0–10.5)
nRBC: 0 % (ref 0.0–0.2)

## 2020-05-24 ENCOUNTER — Telehealth: Payer: Self-pay | Admitting: Hematology

## 2020-05-24 NOTE — Telephone Encounter (Signed)
Scheduled per 9/24 los. Mailing pt appt calendar.

## 2020-07-16 ENCOUNTER — Ambulatory Visit
Admission: RE | Admit: 2020-07-16 | Discharge: 2020-07-16 | Disposition: A | Discharge status: Home / Self Care | Payer: Commercial Managed Care - PPO | Source: Ambulatory Visit | Attending: Hematology | Admitting: Hematology

## 2020-07-16 ENCOUNTER — Other Ambulatory Visit: Payer: Self-pay

## 2020-07-16 DIAGNOSIS — F17210 Nicotine dependence, cigarettes, uncomplicated: Secondary | ICD-10-CM

## 2020-07-16 IMAGING — CT CT CHEST W/O CM
1 series · 15 of 34 positions shown, 19 images · non-contrast
Comparison: None.

CLINICAL DATA: Breast cancer, status post bilateral lumpectomy and
radiation, on oral chemotherapy

EXAM:
CT CHEST WITHOUT CONTRAST
TECHNIQUE: Multidetector CT imaging of the chest was performed following the
standard protocol without IV contrast.

[Series 2: chest w/(date) · axial · 0.64mm/px · z∈[-280,-8]mm · 15 of 160 slices shown, 19 images]
[im 12/160  mediastinal]
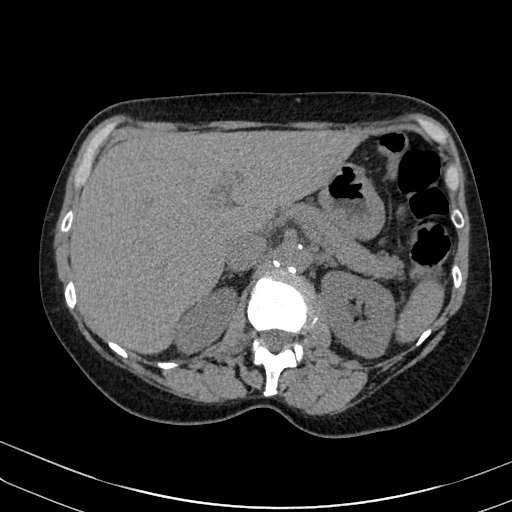
[im 12/160  lung]
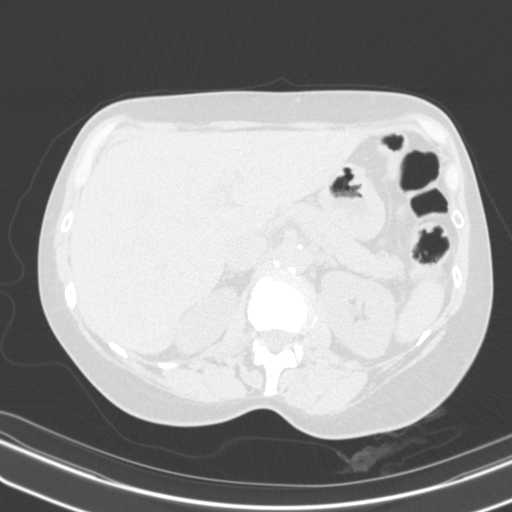
[im 24/160  lung]
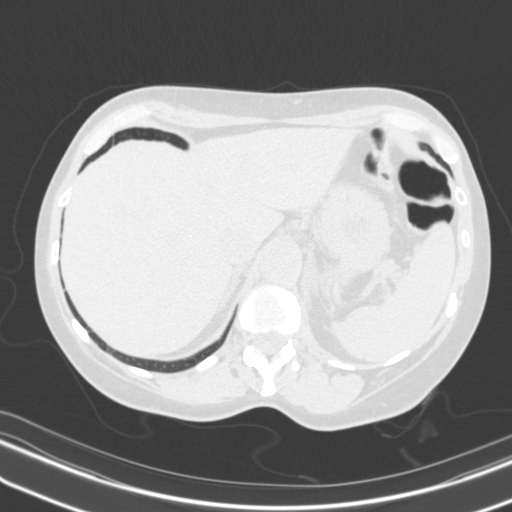
[im 32/160  lung]
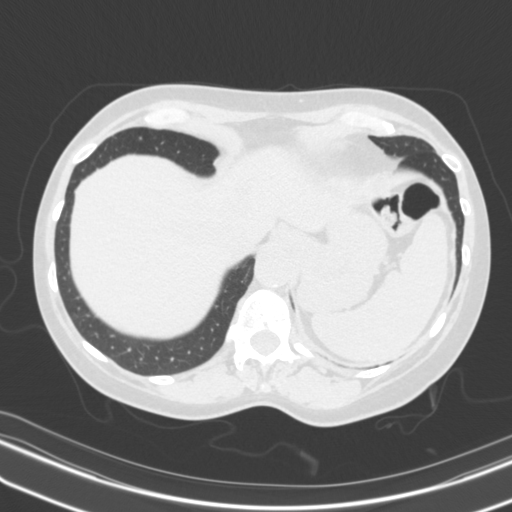
[im 42/160  lung]
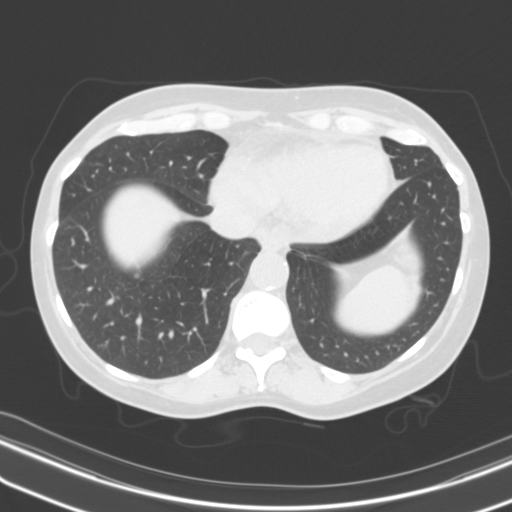
[im 54/160  mediastinal]
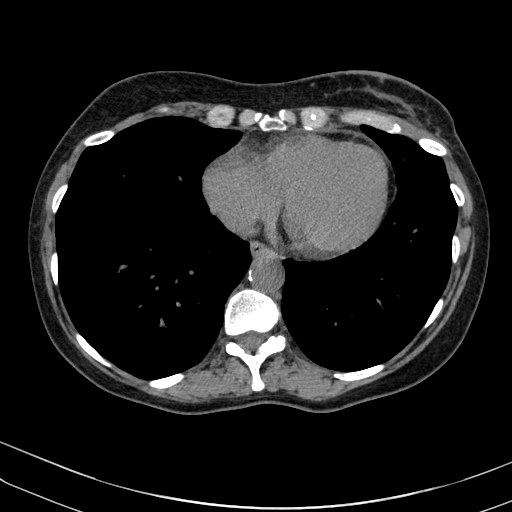
[im 54/160  lung]
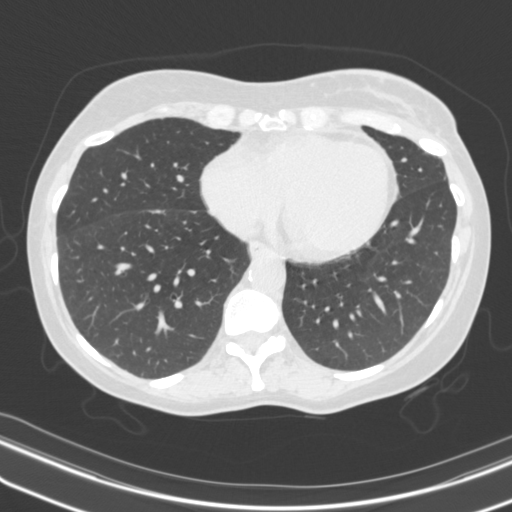
[im 64/160  lung]
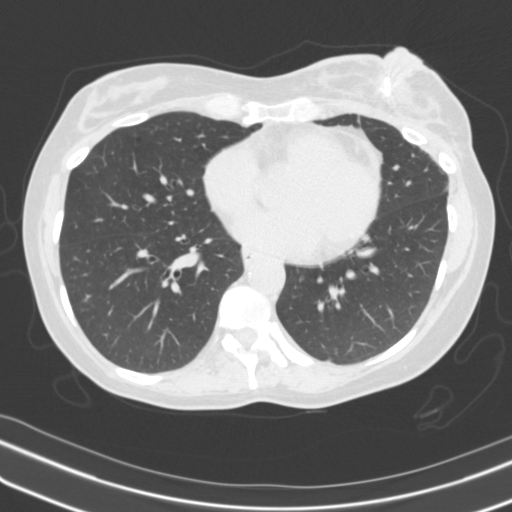
[im 71/160  lung]
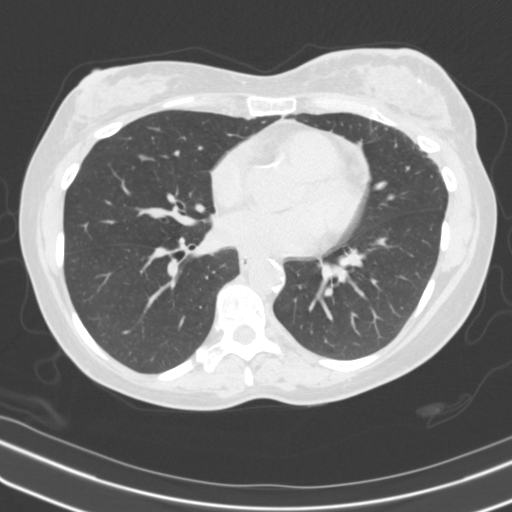
[im 83/160  lung]
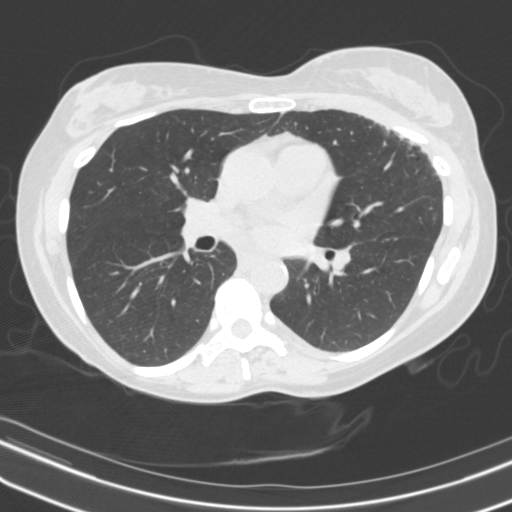
[im 89/160  mediastinal]
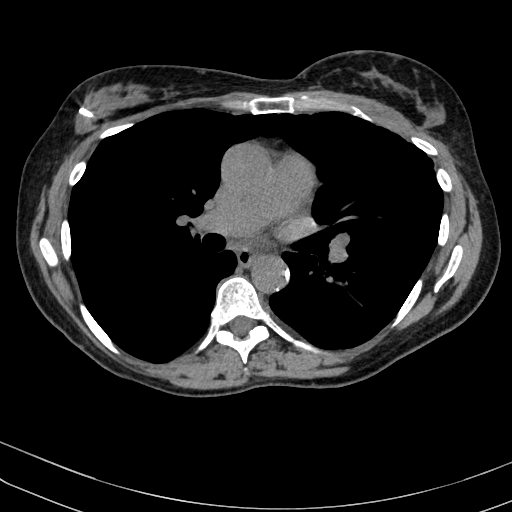
[im 89/160  lung]
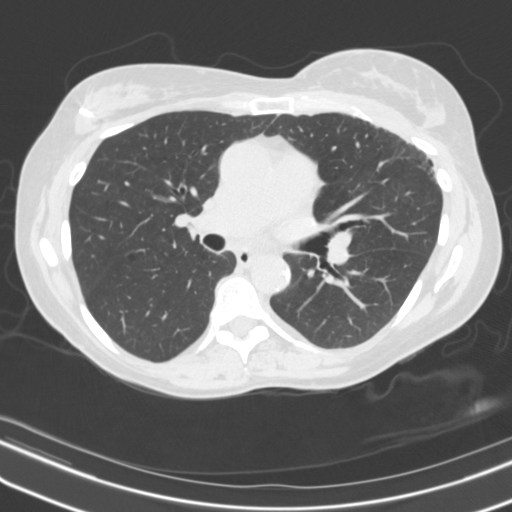
[im 96/160  lung]
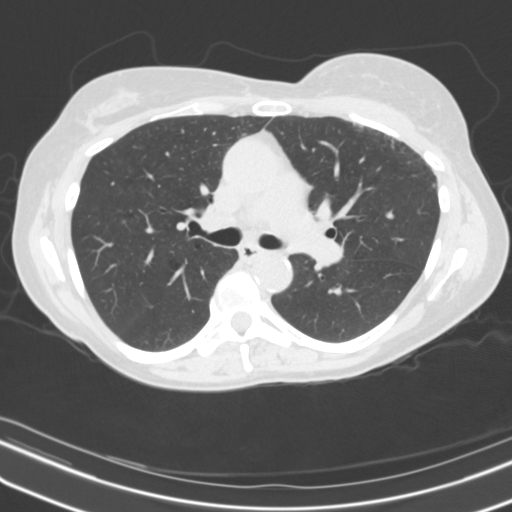
[im 107/160  lung]
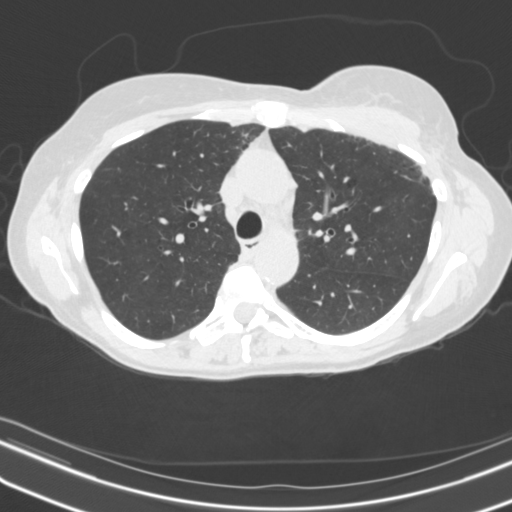
[im 118/160  lung]
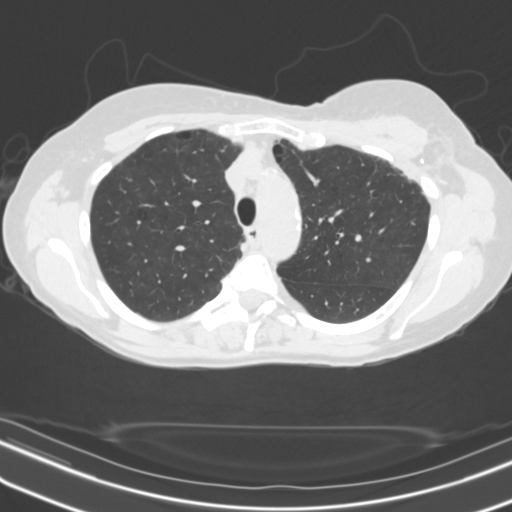
[im 128/160  mediastinal]
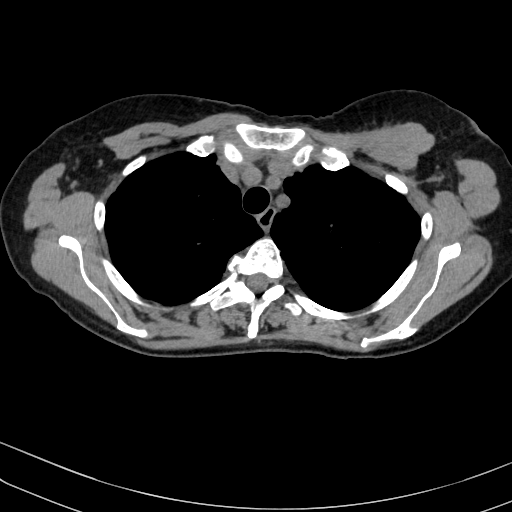
[im 128/160  lung]
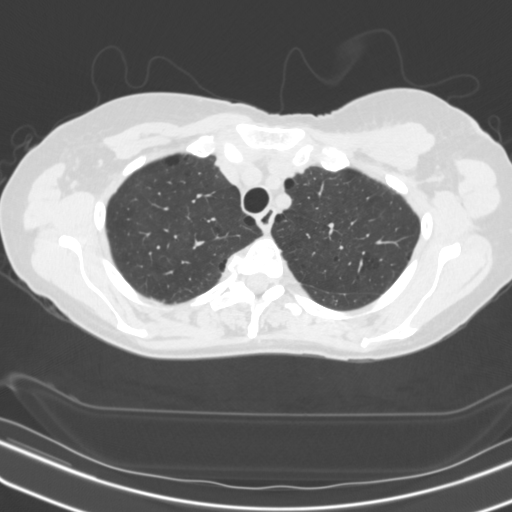
[im 136/160  lung]
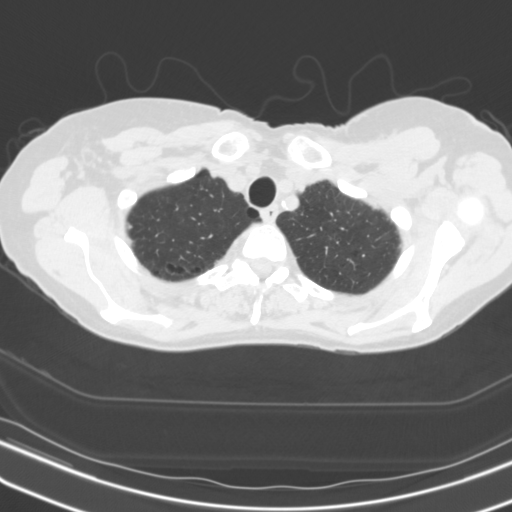
[im 148/160  lung]
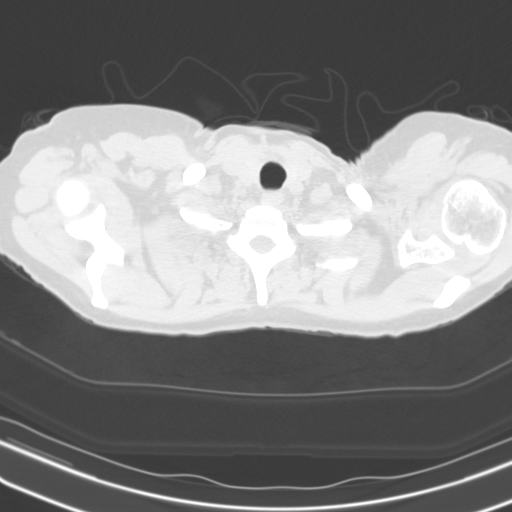

[15 of 34 positions shown; findings below may reference images not displayed]

FINDINGS: Cardiovascular: Heart is normal in size.  No pericardial effusion.

No evidence of thoracic aortic aneurysm. Atherosclerotic
calcifications of the aortic arch.

Mediastinum/Nodes: No suspicious mediastinal lymphadenopathy.

Status post left axillary lymph node dissection.

Visualized thyroid is unremarkable.

Lungs/Pleura: Biapical pleural-parenchymal scarring.

Mild centrilobular and paraseptal emphysematous changes, upper lung
predominant.

Radiation changes in the anterior left upper lobe.

4 mm subpleural nodule in the lateral right upper lobe (series
5/image 35). Additional 2-3 mm subpleural nodules in the upper lobes
bilaterally. 3 mm nodule in the left upper lobe (series 5/image 58).

No focal consolidation.

No pleural effusion or pneumothorax.

Upper Abdomen: Visualized upper abdomen is notable for vascular
calcifications.

Musculoskeletal: Status post bilateral breast lumpectomy

Visualized osseous structures are within normal limits.
IMPRESSION: Small bilateral pulmonary nodules, measuring up to 4 mm in the right
upper lobe. These are not considered overly suspicious for
metastatic disease but warrant attention on follow-up. Consider
follow-up CT chest in 6 months. (Please note that [HOSPITAL]
guidelines do not apply in the setting of known malignancy.)

Status post bilateral breast lumpectomy and left axillary lymph node
dissection. No findings specific for recurrent or metastatic
disease.

Aortic Atherosclerosis ([H0]-[H0]) and Emphysema ([H0]-[H0]).

## 2020-07-17 ENCOUNTER — Ambulatory Visit
Admission: RE | Admit: 2020-07-17 | Discharge: 2020-07-17 | Disposition: A | Discharge status: Home / Self Care | Payer: Commercial Managed Care - PPO | Source: Ambulatory Visit | Attending: Hematology | Admitting: Hematology

## 2020-07-17 DIAGNOSIS — D0501 Lobular carcinoma in situ of right breast: Secondary | ICD-10-CM

## 2020-07-17 IMAGING — MR MR BREAST BILAT WO/W CM
8 of 13 series · 31 of 48 positions shown · IV contrast (gadavist)
Comparison: Prior mammograms and ultrasounds.

CLINICAL DATA: 59-year-old female for screening breast MRI. Patient
with high lifetime risk for developing breast cancer (greater than
20%), LEFT breast cancer and lumpectomy in [68] and RIGHT breast
LCIS and excision in [68].

LABS:  None performed today
EXAM:
BILATERAL BREAST MRI WITH AND WITHOUT CONTRAST
TECHNIQUE: Multiplanar, multisequence MR images of both breasts were obtained
prior to and following the intravenous administration of 6 ml of
Gadavist

[Series 2: t2_tirm_tra ipat (a-p) · axial · 3.0mm · 0.62mm/px · 1 of 58 slices shown]
[im 1/58]
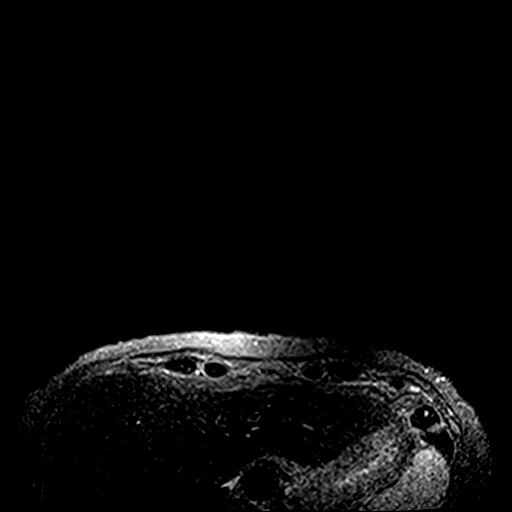

[Series 3: fl3d pre-cm no · axial · non-contrast · 1.2mm · 0.83mm/px · z∈[-95,+77]mm · 5 of 144 slices shown]
[im 1/144]
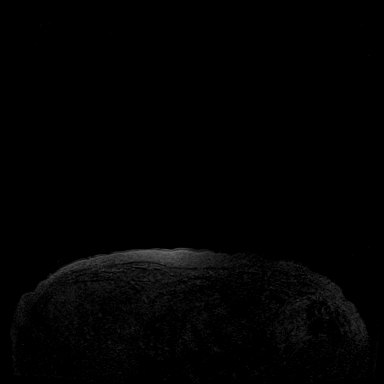
[im 36/144]
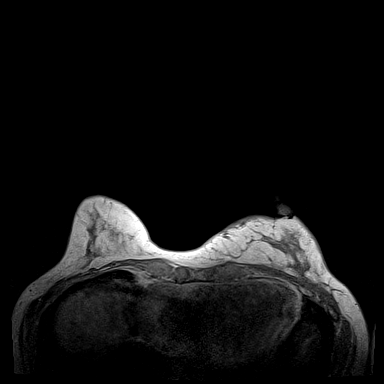
[im 72/144]
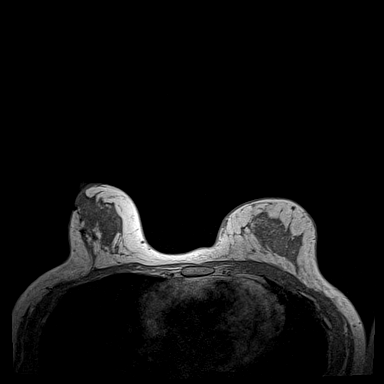
[im 108/144]
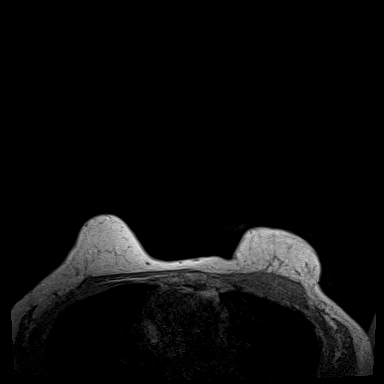
[im 144/144]
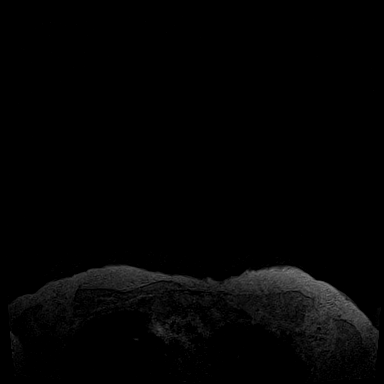

[Series 4: fl3d pre-cm · axial · non-contrast · 1.2mm · 0.83mm/px · z∈[-95,+77]mm · 5 of 144 slices shown]
[im 1/144]
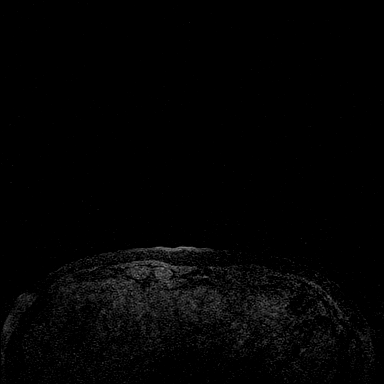
[im 36/144]
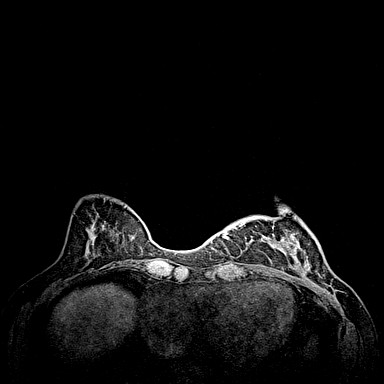
[im 72/144]
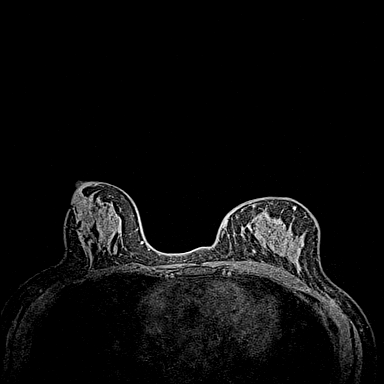
[im 108/144]
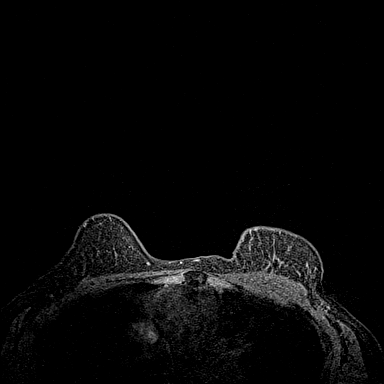
[im 144/144]
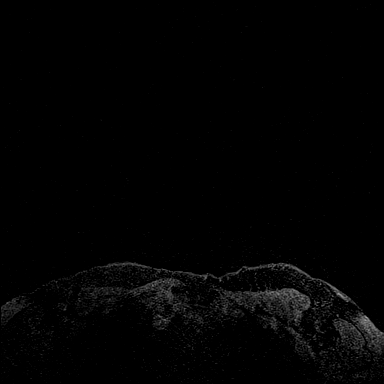

[Series 5: fl3d post-cm 20 · axial · 1.2mm · 0.83mm/px · z∈[-95,+77]mm · 5 of 144 slices shown (1 of 3)]
[im 1/144]
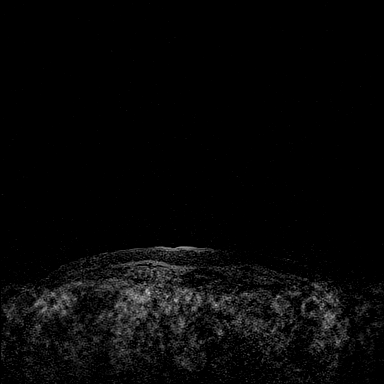
[im 36/144]
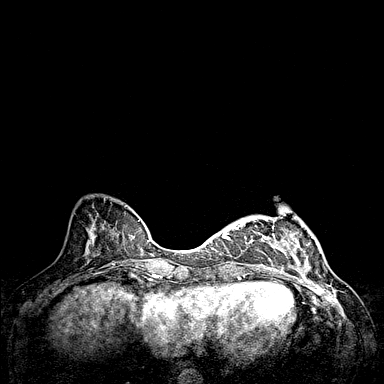
[im 72/144]
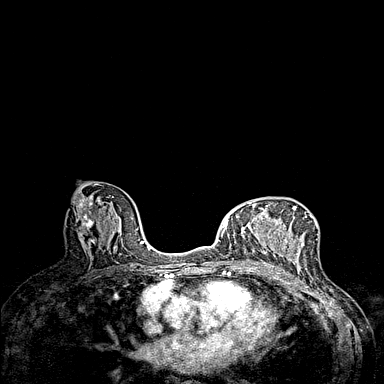
[im 108/144]
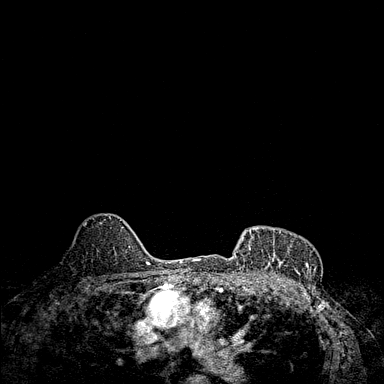
[im 144/144]
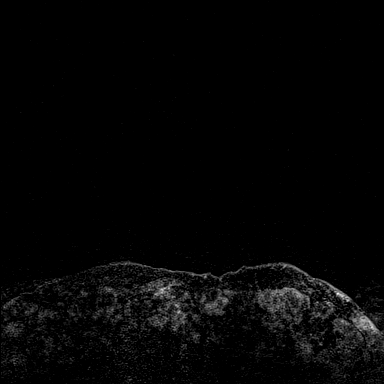

[Series 6: fl3d post-cm 20 · axial · 1.2mm · 0.83mm/px · z∈[-95,+77]mm · 5 of 144 slices shown (2 of 3)]
[im 1/144]
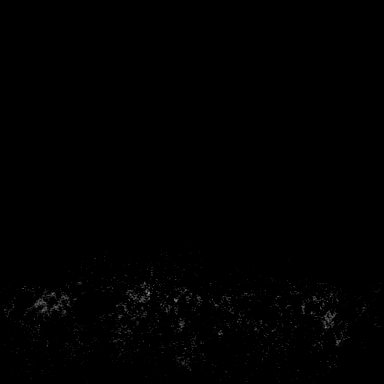
[im 36/144]
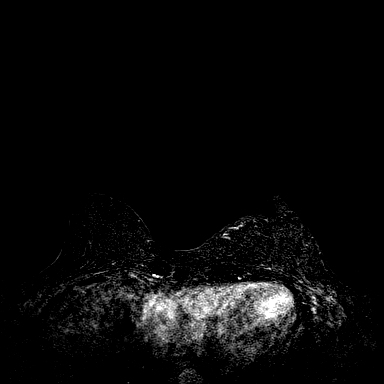
[im 72/144]
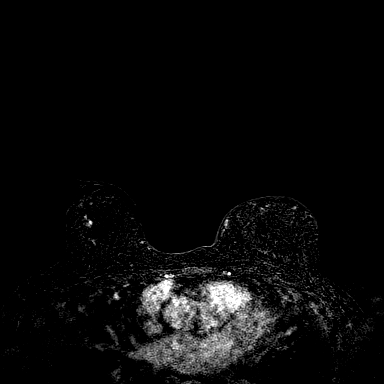
[im 108/144]
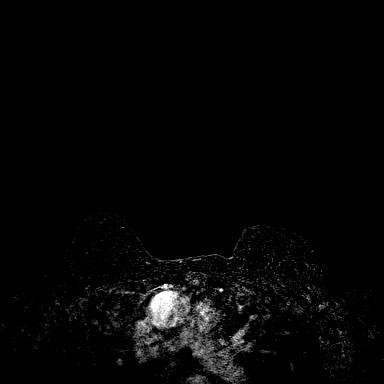
[im 144/144]
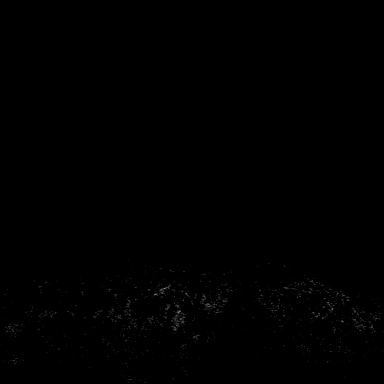

[Series 7: fl3d post-cm 20 · axial · 172.8mm · 0.83mm/px · 1 of 1 slices shown (3 of 3)]
[im 1/1]
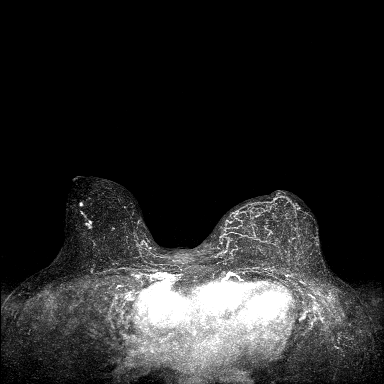

[Series 8: fl3d post-cm 3min · axial · 1.2mm · 0.83mm/px · z∈[-95,+77]mm · 5 of 144 slices shown]
[im 1/144]
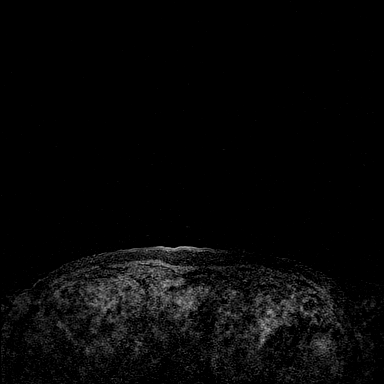
[im 36/144]
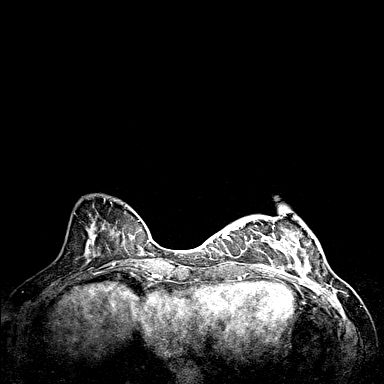
[im 72/144]
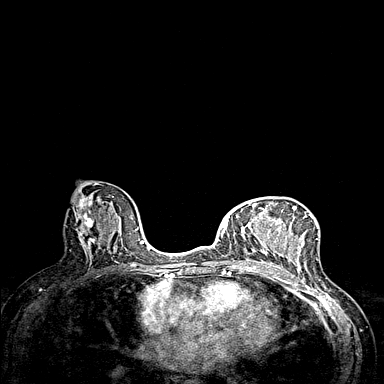
[im 108/144]
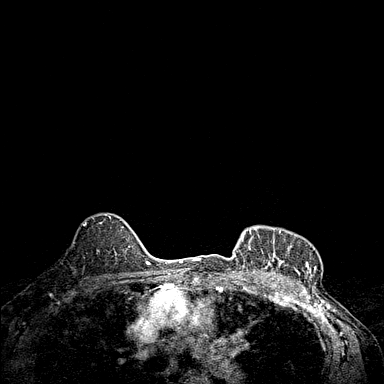
[im 144/144]
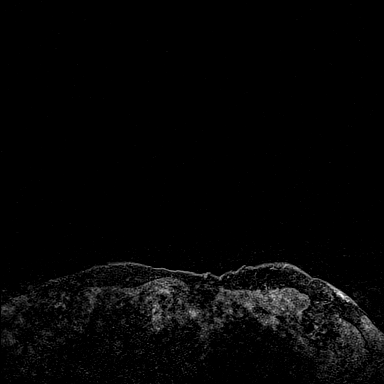

[Series 9: fl3d post-cm 3min_sub · axial · 1.2mm · 0.83mm/px · z∈[-95,+7]mm · 4 of 144 slices shown]
[im 1/144]
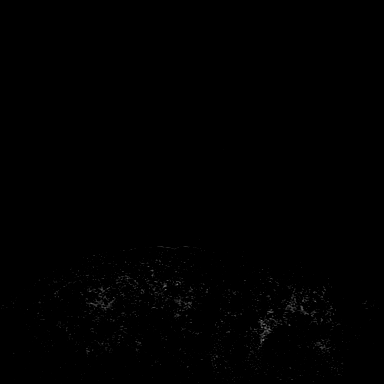
[im 29/144]
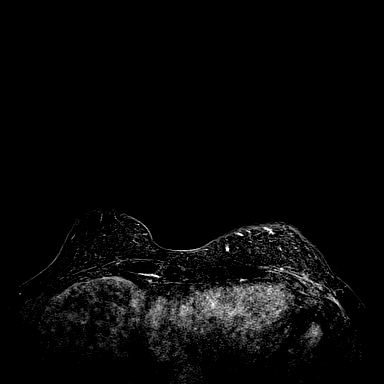
[im 58/144]
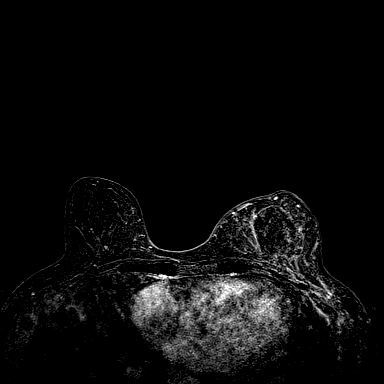
[im 86/144]
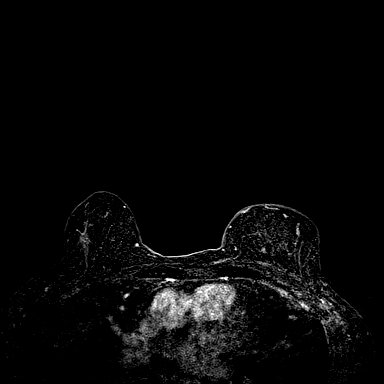

[31 of 48 positions shown; findings below may reference images not displayed]

Three-dimensional MR images were rendered by post-processing of the
original MR data on an independent workstation. The
three-dimensional MR images were interpreted, and findings are
reported in the following complete MRI report for this study. Three
dimensional images were evaluated at the independent interpreting
workstation using the DynaCAD thin client.
FINDINGS: Breast composition: d. Extreme fibroglandular tissue.

Background parenchymal enhancement: Mild

Right breast: Surgical changes are identified within the UPPER-OUTER
RIGHT breast.

A 0.5 cm focus/mass within the anterior OUTER RIGHT breast is
identified (series [DATE]: Image 71).

1 cm posterior to the above focus/mass, a 1 x 2 x 1 cm (transverse x
AP x CC) area of clumped non masslike enhancement is identified in
the OUTER RIGHT breast, middle depth ([DATE]:70-74).

Two 0.4 cm foci are more prominent within the RIGHT breast, 1
located within the anterior LOWER OUTER RIGHT breast ([DATE]:99) and 1
located within the LOWER INNER RIGHT breast ([DATE]:96).

No other suspicious areas of enhancement are identified.

Left breast: No mass or abnormal enhancement. Lumpectomy changes are
present within the LOWER LEFT breast.

Lymph nodes: No abnormal appearing lymph nodes.

Ancillary findings:  None.
IMPRESSION: 1. Indeterminate 0.5 cm focus/mass within the anterior depth OUTER
RIGHT breast and 1 x 2 x 1 cm clumped non masslike enhancement
within the middle depth OUTER RIGHT breast. MR biopsies of both of
these areas recommended.
2. Two prominent but less suspicious foci within the LOWER RIGHT
breast as described above. If the biopsies of the above are positive
for atypia or malignancy and breast conservation is desired, then MR
guided biopsies of these other 2 foci would be recommended. If those
areas are benign, than otherwise attention to these areas on
six-month post biopsy MR is recommended.
3. No abnormal appearing lymph nodes. No suspicious enhancement
within the LEFT breast.
4. Surgical changes within both breast.

RECOMMENDATION:
Two MR guided biopsies of the RIGHT breast as described above. If
these biopsies represent atypia/malignancy and breast conservation
is desired, then MR guided biopsies of the 2 LOWER RIGHT breast foci
would be recommended.

BI-RADS CATEGORY  4: Suspicious.

## 2020-07-17 MED ORDER — GADOBUTROL 1 MMOL/ML IV SOLN
6.0000 mL | Freq: Once | INTRAVENOUS | Status: AC | PRN
  Administered 2020-07-17: 6 mL via INTRAVENOUS

## 2020-07-19 ENCOUNTER — Telehealth: Payer: Self-pay

## 2020-07-19 NOTE — Telephone Encounter (Signed)
Called message left to return call and receive CT results and recommendations per provider CT chest wo contrast in 6 months (please order), and move her next appointments to a few days after CT, thanks

## 2020-07-22 ENCOUNTER — Other Ambulatory Visit: Payer: Self-pay | Admitting: Hematology

## 2020-07-22 DIAGNOSIS — C50312 Malignant neoplasm of lower-inner quadrant of left female breast: Secondary | ICD-10-CM

## 2020-07-30 ENCOUNTER — Other Ambulatory Visit: Payer: Self-pay | Admitting: Hematology

## 2020-07-30 DIAGNOSIS — C50312 Malignant neoplasm of lower-inner quadrant of left female breast: Secondary | ICD-10-CM

## 2020-08-04 ENCOUNTER — Ambulatory Visit
Admission: RE | Admit: 2020-08-04 | Discharge: 2020-08-04 | Disposition: A | Discharge status: Home / Self Care | Payer: Commercial Managed Care - PPO | Source: Ambulatory Visit | Attending: Hematology | Admitting: Hematology

## 2020-08-04 ENCOUNTER — Other Ambulatory Visit: Payer: Self-pay | Admitting: General Practice

## 2020-08-04 ENCOUNTER — Other Ambulatory Visit: Payer: Self-pay

## 2020-08-04 DIAGNOSIS — C50312 Malignant neoplasm of lower-inner quadrant of left female breast: Secondary | ICD-10-CM

## 2020-08-04 DIAGNOSIS — Z170 Estrogen receptor positive status [ER+]: Secondary | ICD-10-CM

## 2020-08-04 HISTORY — PX: BREAST BIOPSY: SHX20

## 2020-08-04 IMAGING — MR MR BREAST BX W/ LOC DEV EA ADD LESION IMAGE BX SPEC MR GUIDE*R*
10 of 14 series · 30 of 48 positions shown · IV contrast (6 ml gadavist)
Comparison: Previous exams.
COMPARISON: Previous exams.

Addendum:
CLINICAL DATA: 59-year-old female with history of left breast
cancer and lumpectomy in [UI] and right breast LCIS status post
excision in [UI]. Patient presents for MRI guided biopsy x2 of the
right breast.

EXAM:
MRI GUIDED CORE NEEDLE BIOPSY OF THE RIGHT BREAST x 2
TECHNIQUE: Multiplanar, multisequence MR imaging of the right breast was
performed both before and after administration of intravenous
contrast.
CONTRAST:  6 ml Gadavist

[Series 2: fiducial unilateral · sagittal · 2.0mm · 1.33mm/px · 1 of 52 slices shown]
[im 1/52]
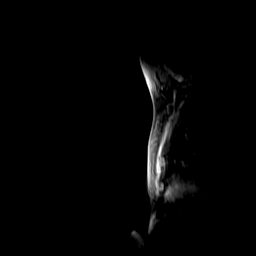

[Series 3: dynamic pre · axial · non-contrast · 1.3mm · 0.73mm/px · z∈[-104,+82]mm · 3 of 144 slices shown]
[im 1/144]
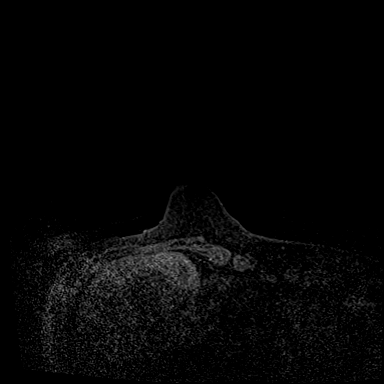
[im 72/144]
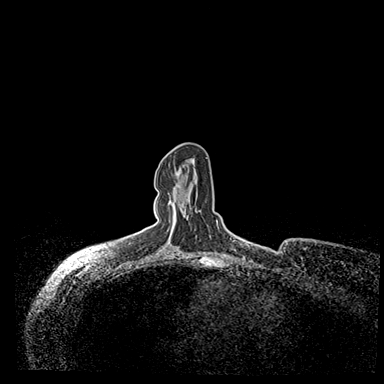
[im 144/144]
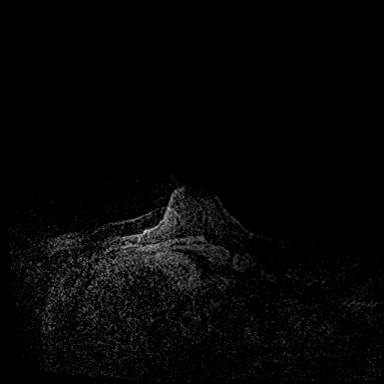

[Series 4: dynamic post 20 · axial · 1.3mm · 0.73mm/px · z∈[-104,+82]mm · 3 of 144 slices shown (1 of 2)]
[im 1/144]
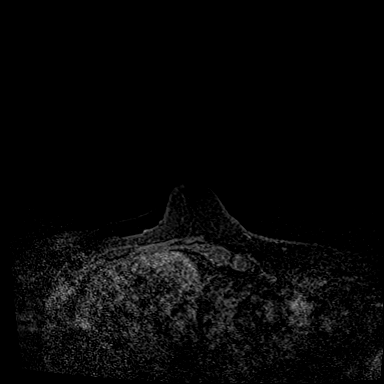
[im 72/144]
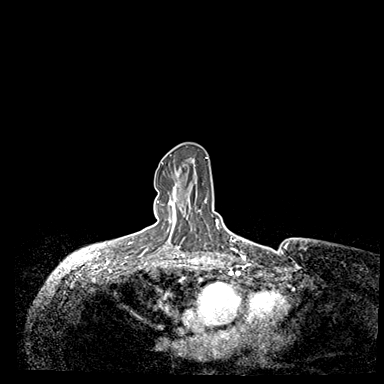
[im 144/144]
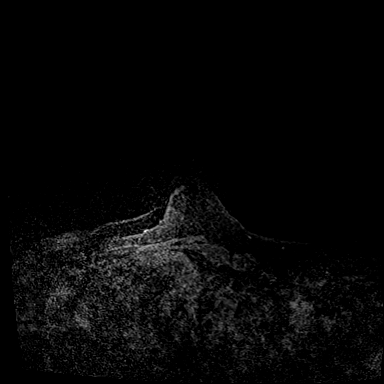

[Series 5: dynamic post 20 · axial · 1.3mm · 0.73mm/px · z∈[-104,+82]mm · 3 of 144 slices shown (2 of 2)]
[im 1/144]
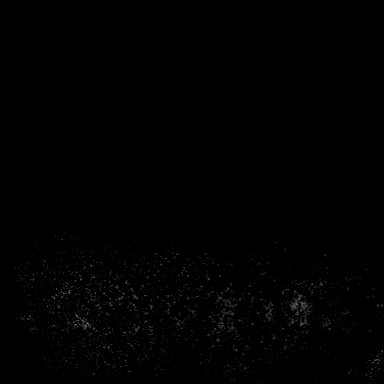
[im 72/144]
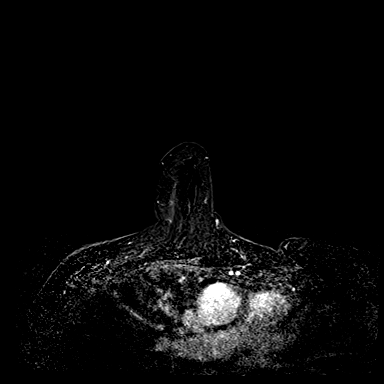
[im 144/144]
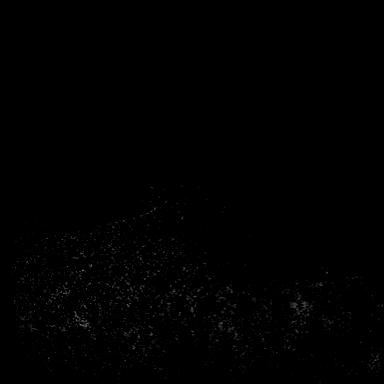

[Series 6: dynamic post 3 · axial · 1.3mm · 0.73mm/px · z∈[-104,+82]mm · 3 of 144 slices shown (1 of 2)]
[im 1/144]
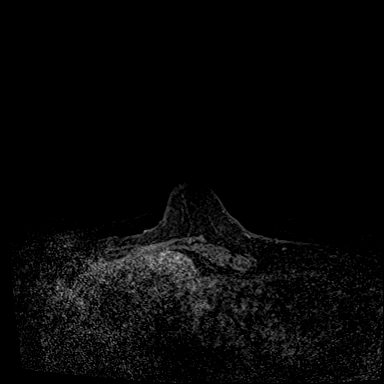
[im 72/144]
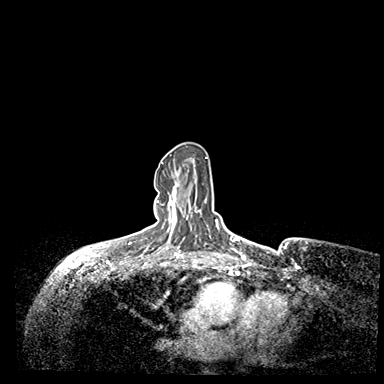
[im 144/144]
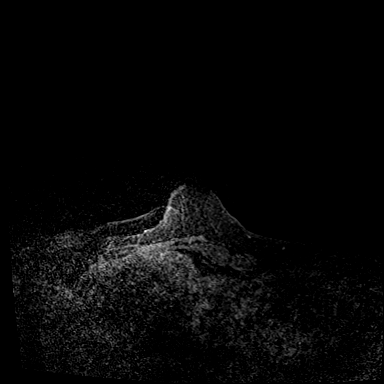

[Series 7: dynamic post 3 · axial · 1.3mm · 0.73mm/px · z∈[-104,+82]mm · 3 of 144 slices shown (2 of 2)]
[im 1/144]
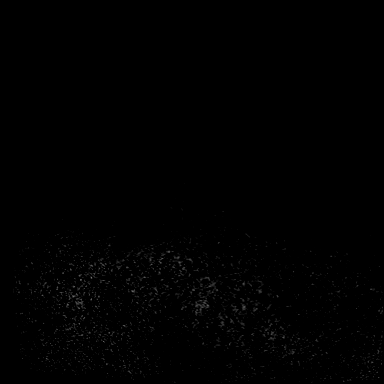
[im 72/144]
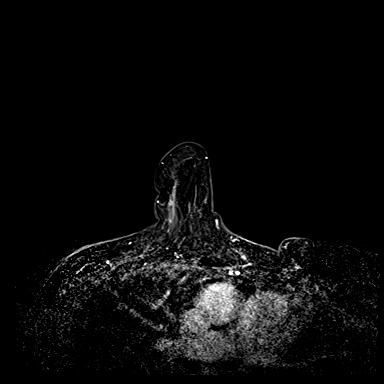
[im 144/144]
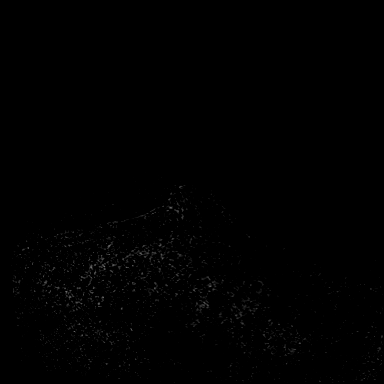

[Series 8: needle confirmation · axial · 1.3mm · 0.73mm/px · z∈[-104,+82]mm · 4 of 144 slices shown]
[im 1/144]
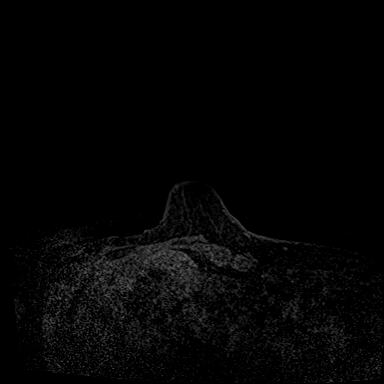
[im 48/144]
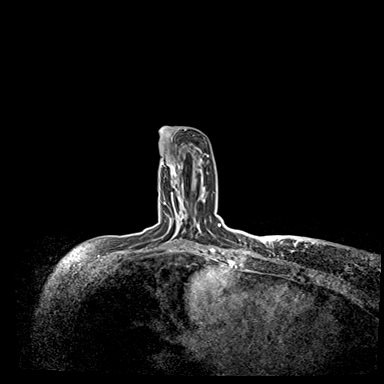
[im 96/144]
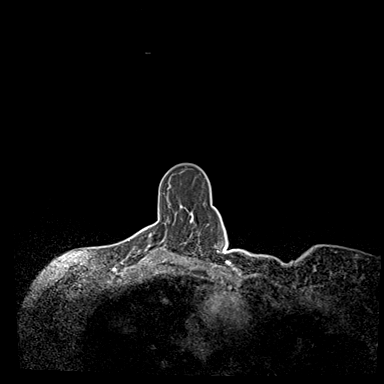
[im 144/144]
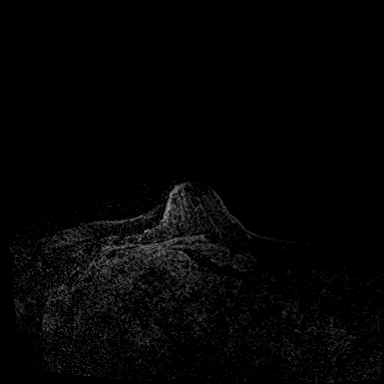

[Series 9: needle confirmation_sub · axial · 1.3mm · 0.73mm/px · z∈[-104,+82]mm · 4 of 144 slices shown]
[im 1/144]
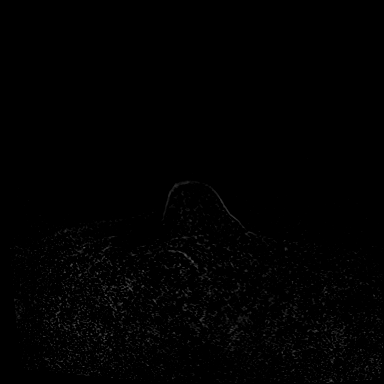
[im 48/144]
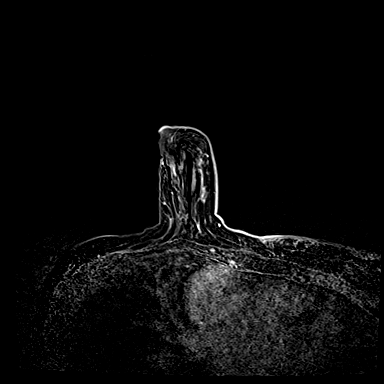
[im 96/144]
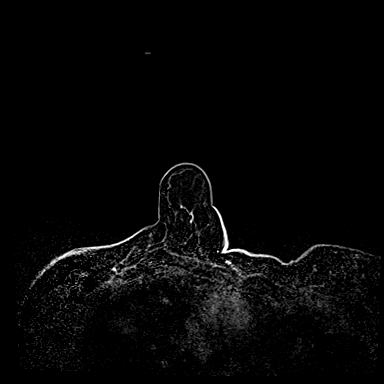
[im 144/144]
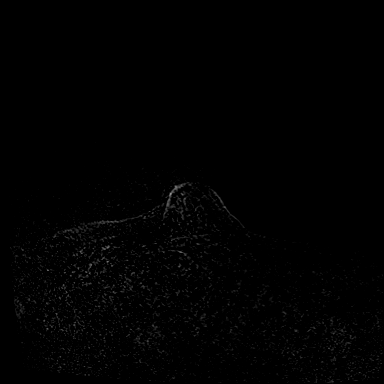

[Series 10: post bx · axial · 1.3mm · 0.73mm/px · z∈[-104,+82]mm · 4 of 144 slices shown]
[im 1/144]
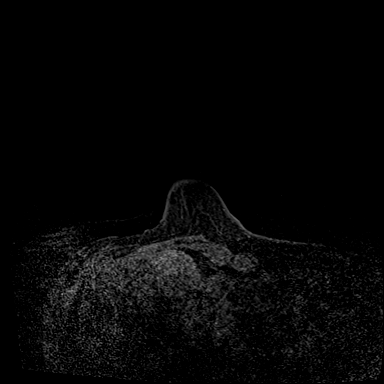
[im 48/144]
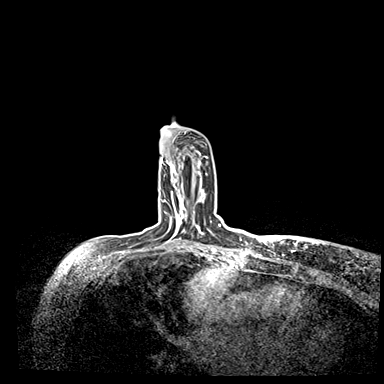
[im 96/144]
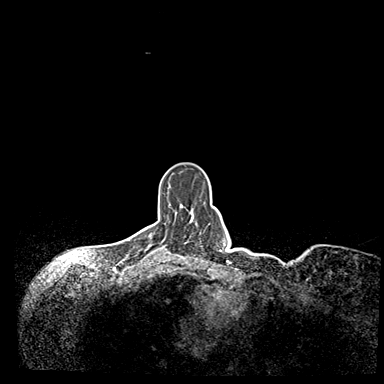
[im 144/144]
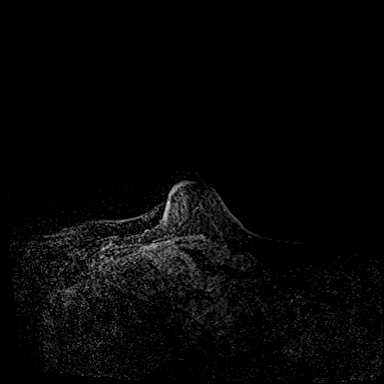

[Series 11: post bx_sub · axial · 1.3mm · 0.73mm/px · z∈[-104,-43]mm · 2 of 144 slices shown]
[im 1/144]
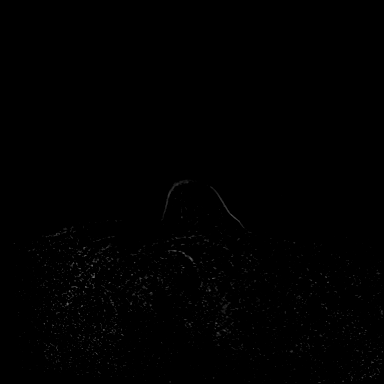
[im 48/144]
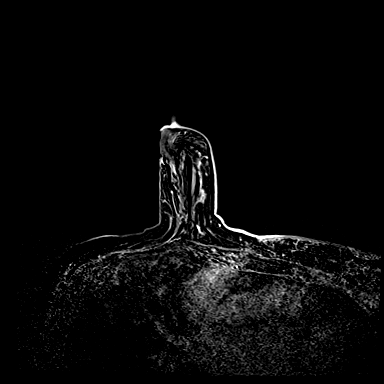

[30 of 48 positions shown; findings below may reference images not displayed]

FINDINGS: I met with the patient, and we discussed the procedure of MRI guided
biopsy, including risks, benefits, and alternatives. Specifically,
we discussed the risks of infection, bleeding, tissue injury, clip
migration, and inadequate sampling. Informed, written consent was
given. The usual time out protocol was performed immediately prior
to the procedure.

1. Using sterile technique, 1% Lidocaine, MRI guidance, and a 9
gauge vacuum assisted device, biopsy was performed of a mass in the
outer right breast anterior using a lateral approach. At the
conclusion of the procedure, a a cylinder tissue marker clip was
deployed into the biopsy cavity. Follow-up 2-view mammogram was
performed and dictated separately.

2. Using sterile technique, 1% Lidocaine, MRI guidance, and a 9
gauge vacuum assisted device, biopsy was performed of non mass
enhancement in the right breast outer middle using a lateral
approach. At the conclusion of the procedure, a a barbell tissue
marker clip was deployed into the biopsy cavity. Follow-up 2-view
mammogram was performed and dictated separately.
IMPRESSION: MRI guided biopsy of a mass in the outer right breast and of non
mass enhancement in the outer right breast. No apparent
complications.

ADDENDUM:
Pathology revealed PLEOMORPHIC LOBULAR CARCINOMA IN SITU PARTIALLY
INVOLVING A COMPLEX SCLEROSING LESION AND SCLEROSING ADENOSIS,
SCLEROSING ADENOSIS WITH CALCIFICATIONS, BENIGN SKIN of the Right
breast, anterior. This was found to be concordant by Dr. RTOYOTA
RTOYOTA, with excision recommended.

Pathology revealed SCLEROSING ADENOSIS WITH CALCIFICATIONS, PRIOR
RESECTION SITE CHANGES of the Right breast, outer middle. This was
found to be concordant by Dr. RTOYOTA.

Pathology results were discussed with the patient by telephone. The
patient reported doing well after the biopsies with tenderness at
the sites. Post biopsy instructions and care were reviewed and
questions were answered. The patient was encouraged to call The
direct phone number was provided.

Surgical consultation has been arranged with Dr. RTOYOTA at

The patient is scheduled for Right breast MRI guided biopsy x 2 on
[DATE].

Further recommendations will be guided by the results of these
biopsies.

Pathology results reported by RTOYOTA, RN on [DATE].

*** End of Addendum ***
FINDINGS: I met with the patient, and we discussed the procedure of MRI guided
biopsy, including risks, benefits, and alternatives. Specifically,
we discussed the risks of infection, bleeding, tissue injury, clip
migration, and inadequate sampling. Informed, written consent was
given. The usual time out protocol was performed immediately prior
to the procedure.

1. Using sterile technique, 1% Lidocaine, MRI guidance, and a 9
gauge vacuum assisted device, biopsy was performed of a mass in the
outer right breast anterior using a lateral approach. At the
conclusion of the procedure, a a cylinder tissue marker clip was
deployed into the biopsy cavity. Follow-up 2-view mammogram was
performed and dictated separately.

2. Using sterile technique, 1% Lidocaine, MRI guidance, and a 9
gauge vacuum assisted device, biopsy was performed of non mass
enhancement in the right breast outer middle using a lateral
approach. At the conclusion of the procedure, a a barbell tissue
marker clip was deployed into the biopsy cavity. Follow-up 2-view
mammogram was performed and dictated separately.
IMPRESSION: MRI guided biopsy of a mass in the outer right breast and of non
mass enhancement in the outer right breast. No apparent
complications.

## 2020-08-04 IMAGING — MG MM BREAST LOCALIZATION CLIP
4 series · 4 of 12 positions shown · non-contrast
Comparison: Previous exam(s).

CLINICAL DATA: Post procedure mammogram for clip placement.

EXAM:
DIAGNOSTIC RIGHT MAMMOGRAM POST MRI BIOPSY

[R CC synth-2D]
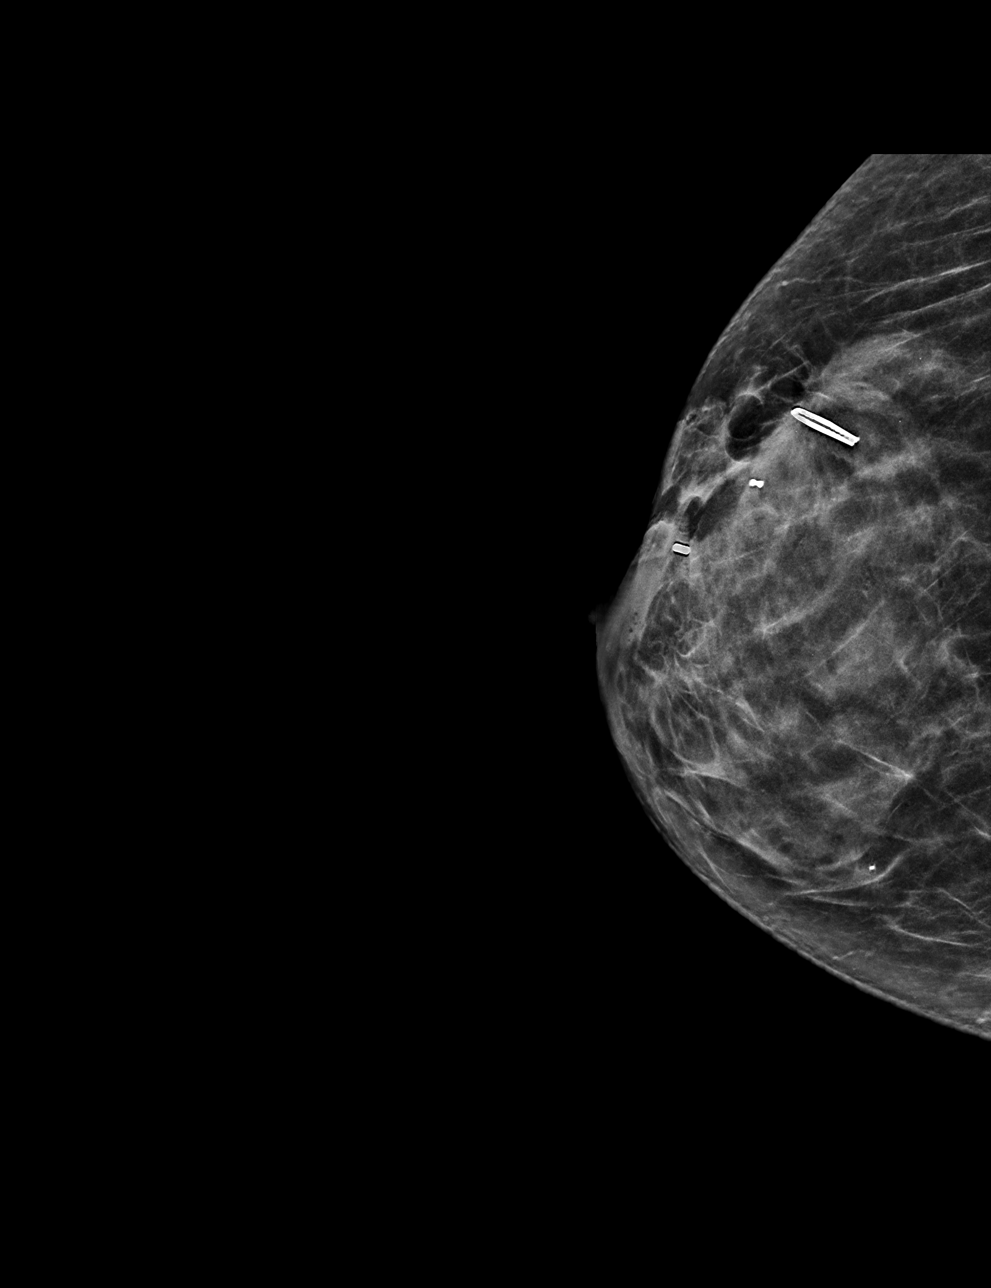

[R ML synth-2D]
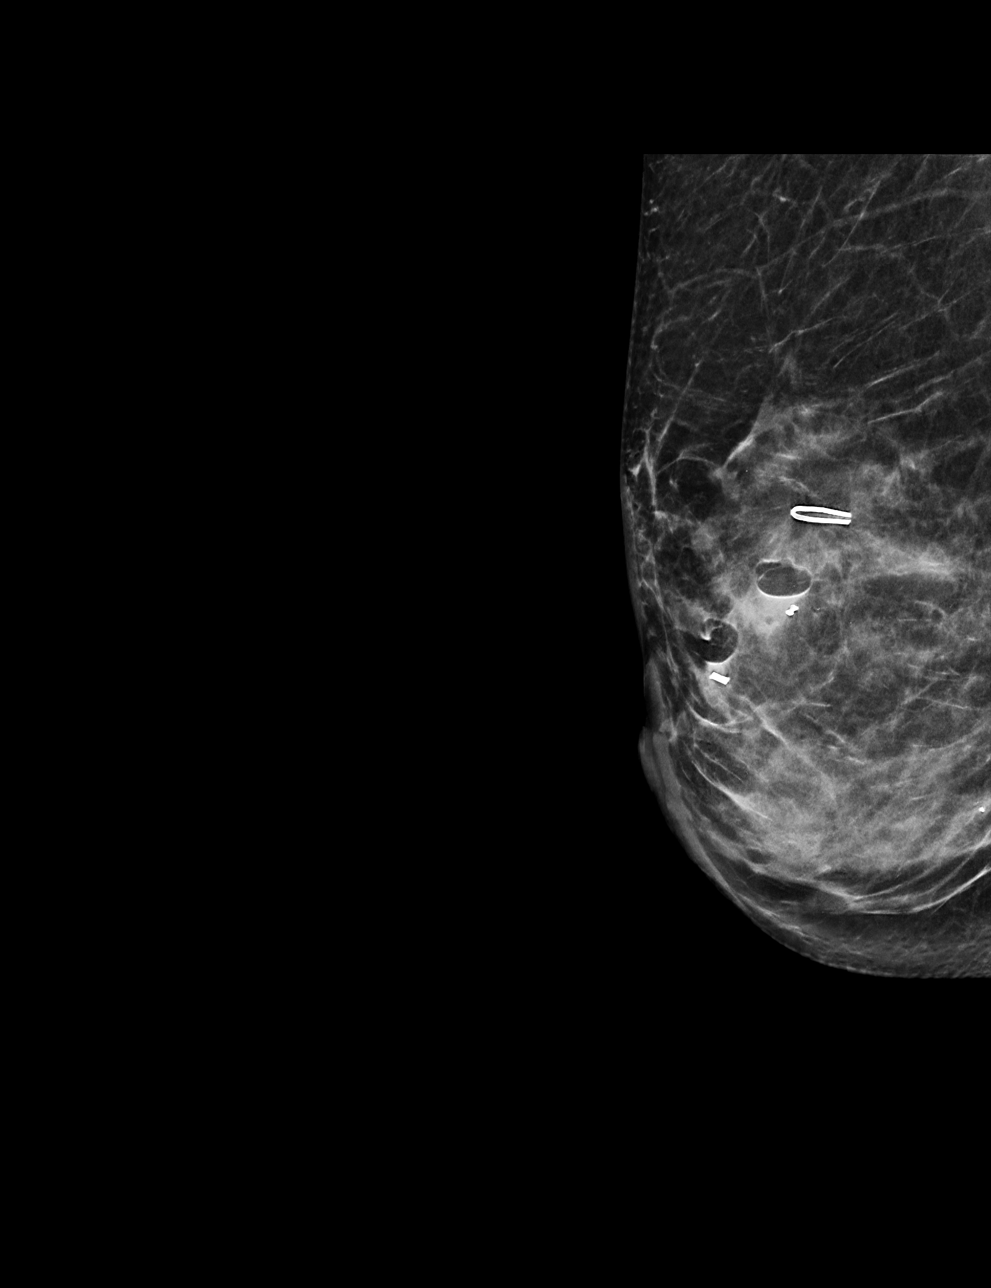

[R ML tomo · tomo slice 27/53.0]
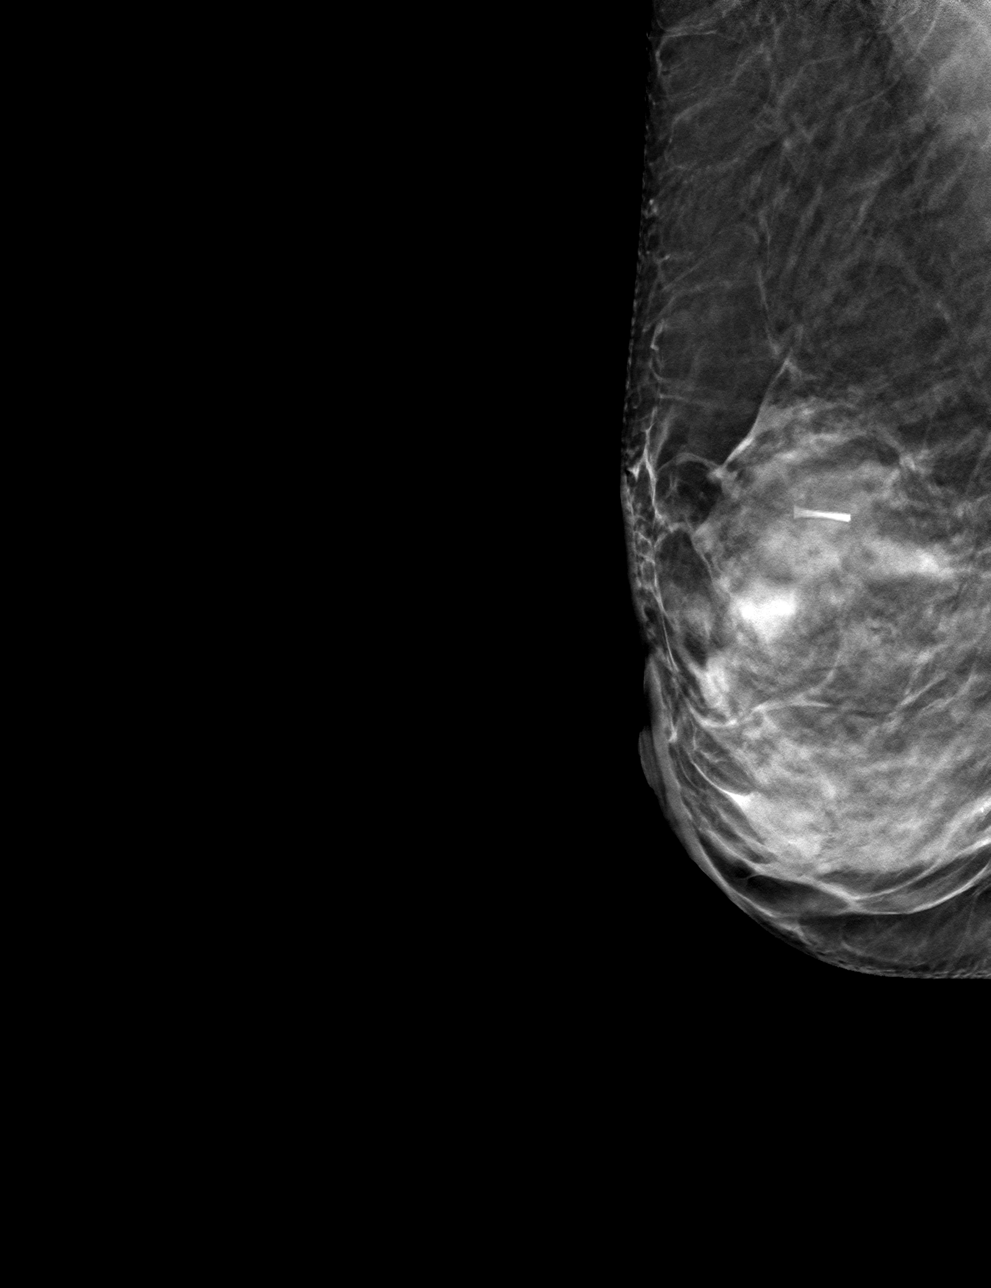

[R CC tomo · tomo slice 29/57.0]
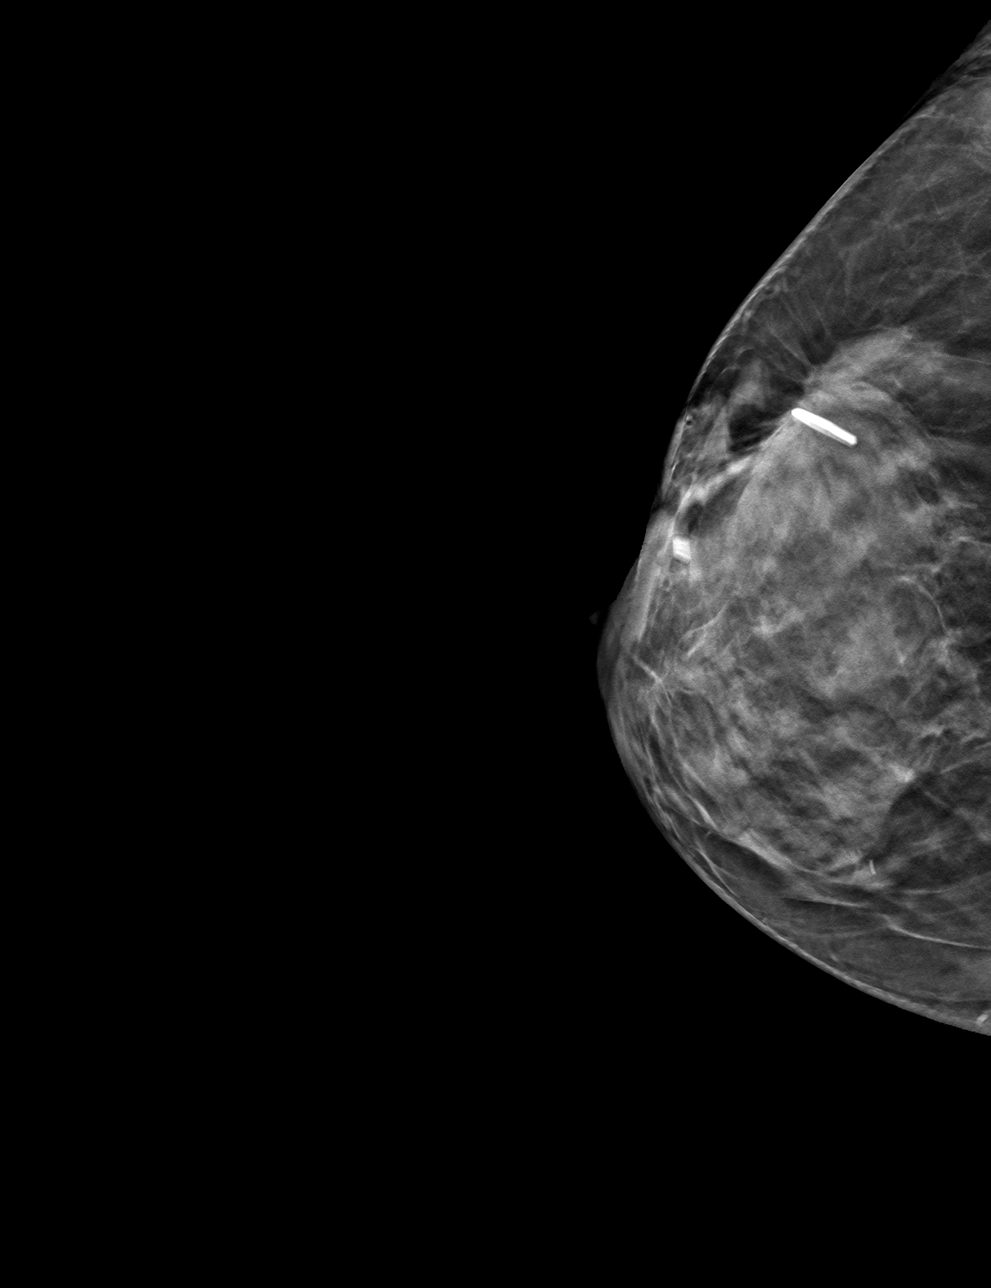

[4 of 12 positions shown; findings below may reference images not displayed]

FINDINGS: 1. Mammographic images were obtained following MRI guided biopsy of
a mass in the outer right breast, anterior. The cylinder biopsy
marking clip is in expected position at the site of biopsy.

2. Mammographic images were obtained following MRI guided biopsy of
non mass enhancement in the outer right breast, middle. The barbell
biopsy marking clip is in expected position at the site of biopsy.
IMPRESSION: 1. Appropriate positioning of the cylinder shaped biopsy marking
clip at the site of biopsy in the outer right breast, anterior.

2. Appropriate positioning of the barbell shaped biopsy marking clip
at the site of biopsy in the outer right breast, middle.

Final Assessment: Post Procedure Mammograms for Marker Placement

## 2020-08-04 MED ORDER — GADOBUTROL 1 MMOL/ML IV SOLN
6.0000 mL | Freq: Once | INTRAVENOUS | Status: AC | PRN
  Administered 2020-08-04: 6 mL via INTRAVENOUS

## 2020-08-06 ENCOUNTER — Other Ambulatory Visit: Payer: Self-pay | Admitting: Hematology

## 2020-08-06 DIAGNOSIS — R9389 Abnormal findings on diagnostic imaging of other specified body structures: Secondary | ICD-10-CM

## 2020-08-12 ENCOUNTER — Ambulatory Visit
Admission: RE | Admit: 2020-08-12 | Discharge: 2020-08-12 | Disposition: A | Discharge status: Home / Self Care | Payer: Commercial Managed Care - PPO | Source: Ambulatory Visit | Attending: Hematology | Admitting: Hematology

## 2020-08-12 ENCOUNTER — Other Ambulatory Visit: Payer: Self-pay

## 2020-08-12 ENCOUNTER — Other Ambulatory Visit (HOSPITAL_COMMUNITY): Payer: Self-pay | Admitting: Diagnostic Radiology

## 2020-08-12 DIAGNOSIS — R9389 Abnormal findings on diagnostic imaging of other specified body structures: Secondary | ICD-10-CM

## 2020-08-12 IMAGING — MG MM BREAST LOCALIZATION CLIP
4 series · 4 of 12 positions shown · non-contrast
Comparison: Previous exam(s).

CLINICAL DATA: Evaluate post biopsy marker clip placement following
MRI guided biopsy 2 left breast masses, in the lower outer quadrant,
anterior depth and in the lower inner quadrant, posterior depth. She
recently underwent 2 other MRI guided biopsies, on [DATE], 1
site revealing LCIS. She has a history right breast carcinoma
treated with lumpectomy in [YT].

EXAM:
DIAGNOSTIC RIGHT MAMMOGRAM POST MRI BIOPSY

[R CC synth-2D]
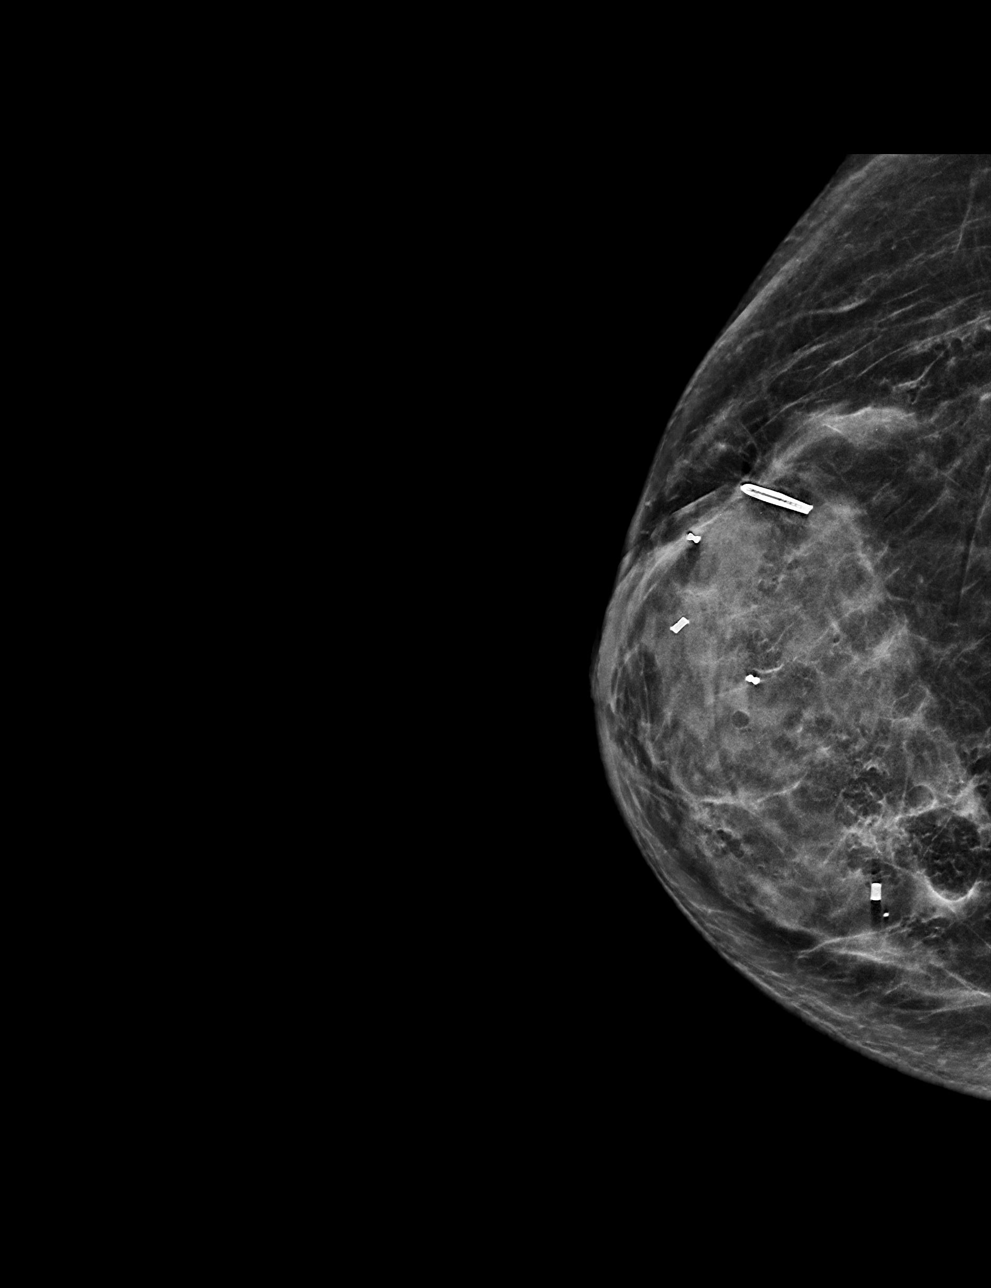

[R LM synth-2D]
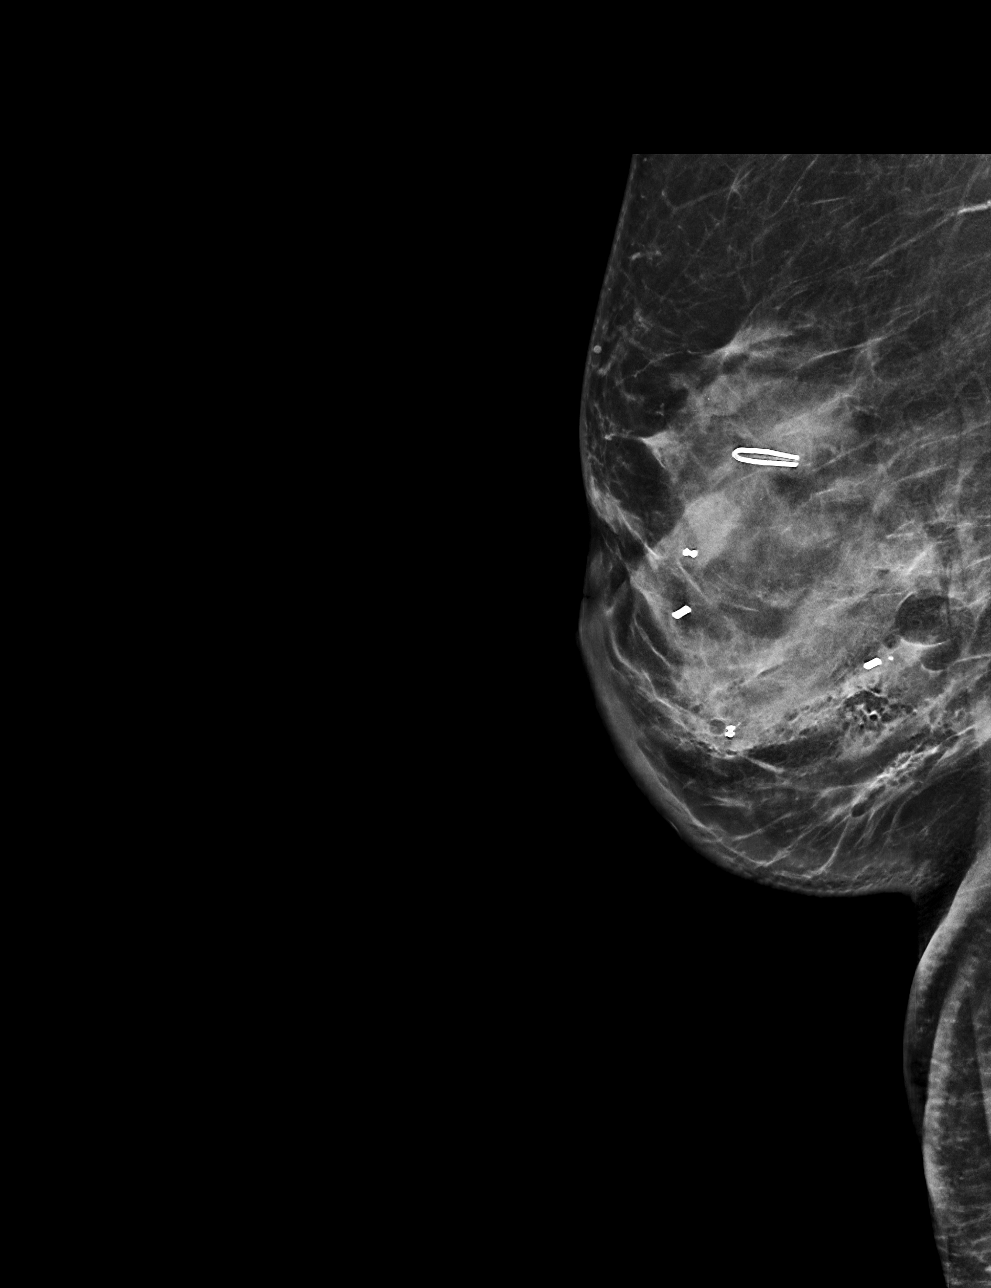

[R LM tomo · tomo slice 33/65.0]
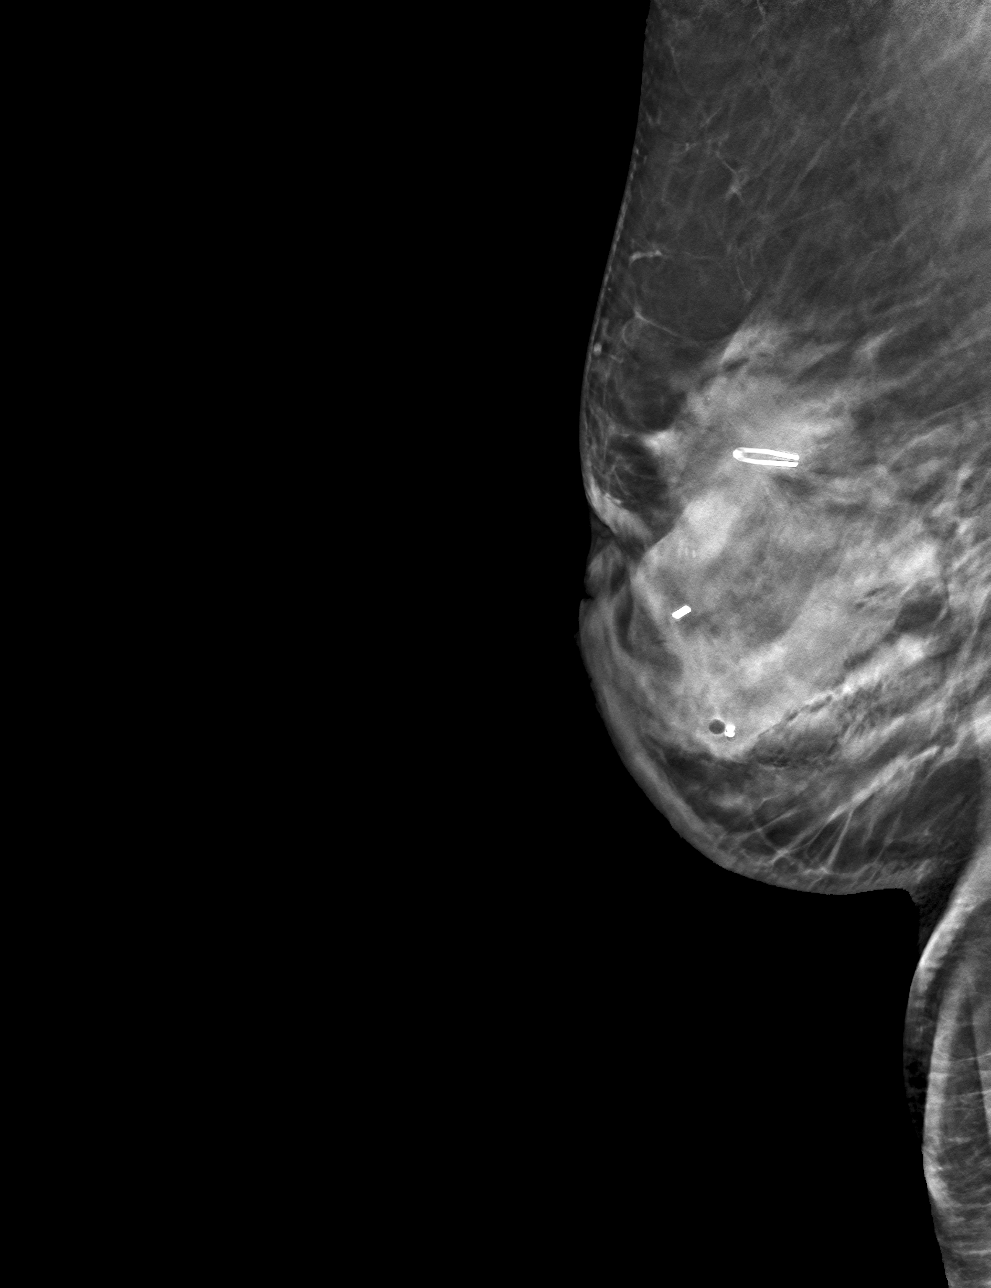

[R CC tomo · tomo slice 33/64.0]
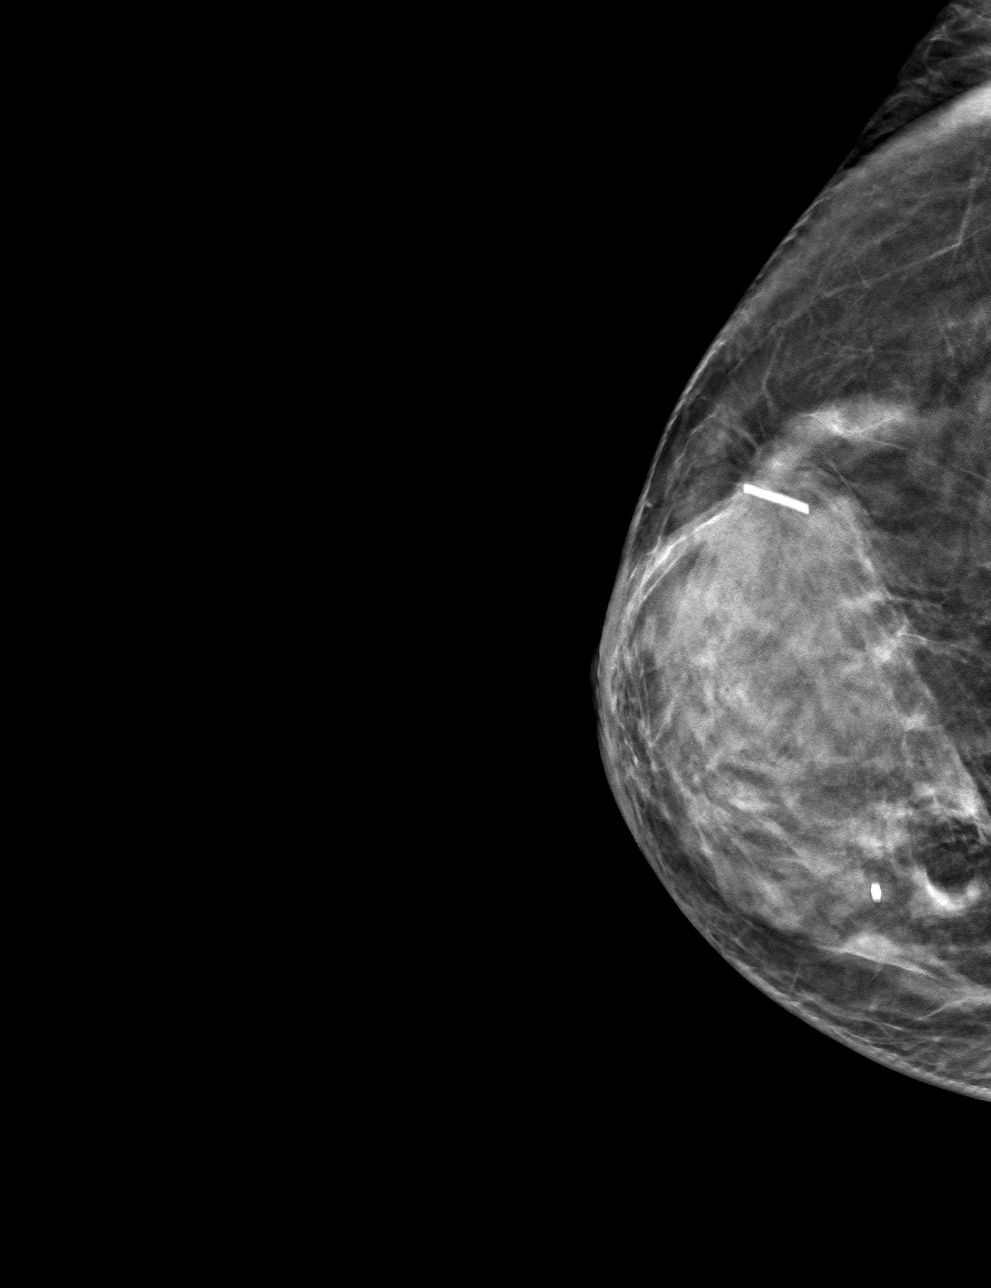

[4 of 12 positions shown; findings below may reference images not displayed]

FINDINGS: Mammographic images were obtained following MRI guided biopsy of 2
additional right breast lesions. The second barbell shaped biopsy
clip lies in the anterior, lower outer quadrant in the expected
location the 4 mm mass seen MRI, and the second cylinder shaped
biopsy clip lies in the posterior, lower inner quadrant,
corresponding to the other 4 mm mass seen on MRI.
IMPRESSION: Appropriate positioning of the barbell and cylinder shaped biopsy
marking clip at the site of biopsy in the positions detailed above.
The patient has additional barbell and cylinder shaped biopsy clips
localizing the previous MRI biopsies. The previous cylinder shaped
biopsy clip localize the area of LCIS.

Final Assessment: Post Procedure Mammograms for Marker Placement

## 2020-08-12 IMAGING — MR MR BREAST BX W/ LOC DEV 1ST LEASION IMAGE BX SPEC MR GUIDE*R*
5 of 7 series · 30 of 48 positions shown · IV contrast (gadavist)
Comparison: Previous exams.
COMPARISON: Previous exams.

Addendum:
CLINICAL DATA: Patient presents for MRI guided biopsy of 2
additional right breast enhancing masses. She underwent biopsy of 2
right breast masses on [DATE], 1 revealing ADH. Surgical
excision for the ADH is planned.

EXAM:
MRI GUIDED CORE NEEDLE BIOPSY OF THE RIGHT BREAST
TECHNIQUE: Multiplanar, multisequence MR imaging of the right breast was
performed both before and after administration of intravenous
contrast.
CONTRAST:  6mL GADAVIST GADOBUTROL 1 MMOL/ML IV SOLN

[Series 3: fiducial unilateral · sagittal · 2.0mm · 1.33mm/px · 1 of 52 slices shown]
[im 1/52]
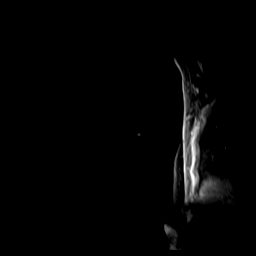

[Series 4: dynamic pre · axial · non-contrast · 1.3mm · 0.73mm/px · z∈[-118,+109]mm · 7 of 176 slices shown]
[im 1/176]
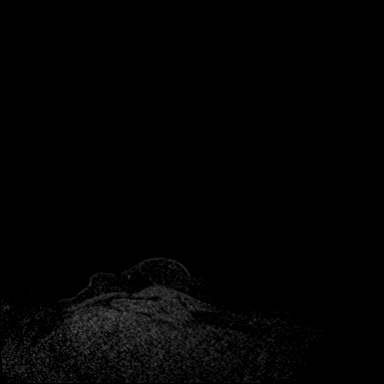
[im 30/176]
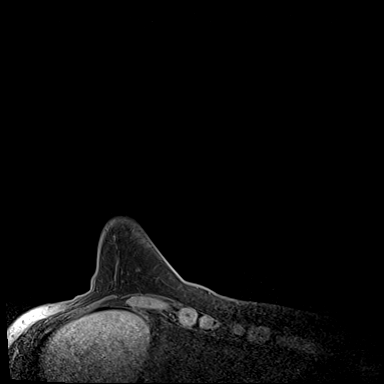
[im 59/176]
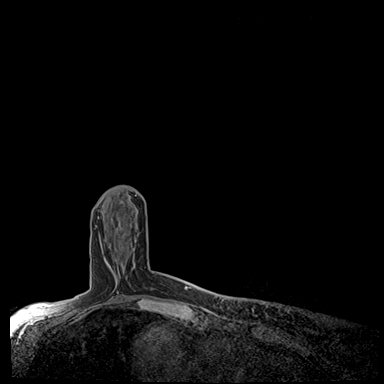
[im 88/176]
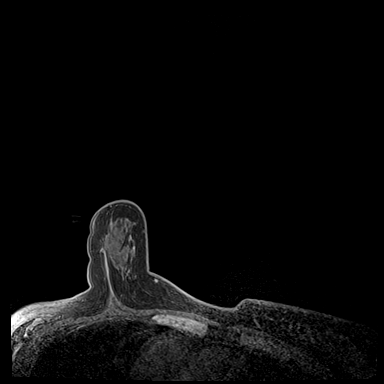
[im 117/176]
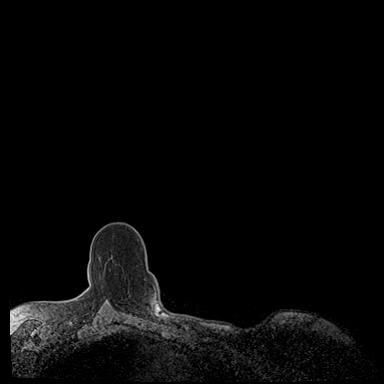
[im 146/176]
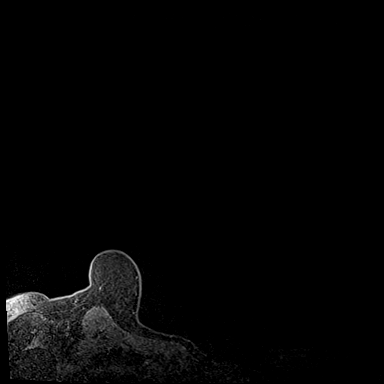
[im 176/176]
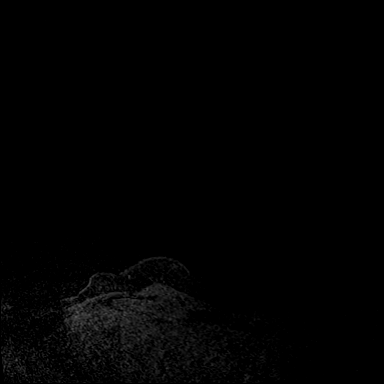

[Series 5: dynamic post 20 · axial · 1.3mm · 0.73mm/px · z∈[-118,+109]mm · 8 of 176 slices shown (1 of 2)]
[im 1/176]
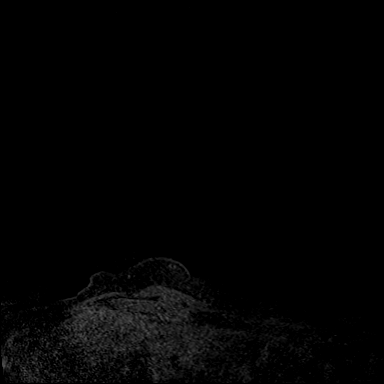
[im 26/176]
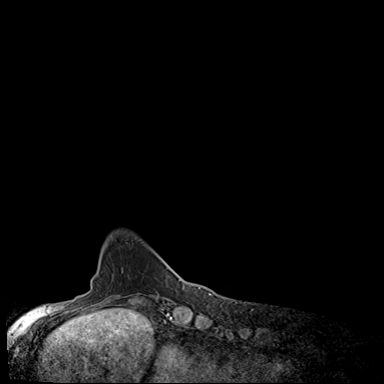
[im 51/176]
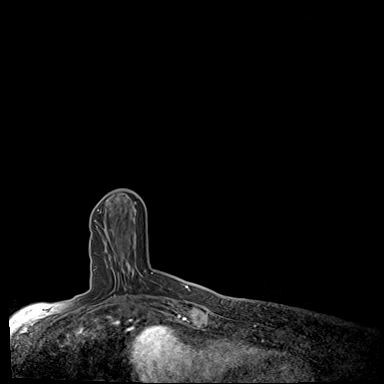
[im 76/176]
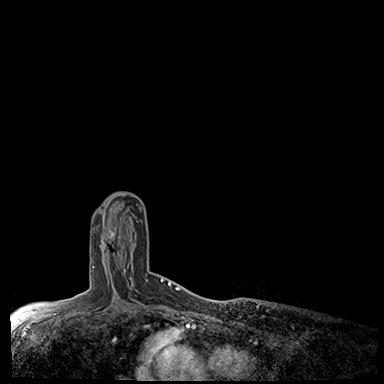
[im 101/176]
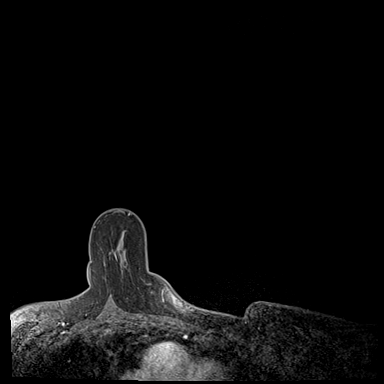
[im 126/176]
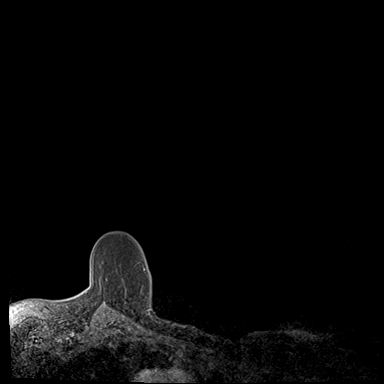
[im 151/176]
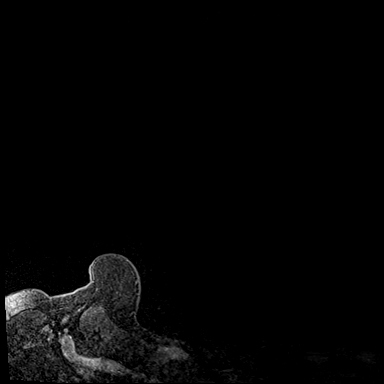
[im 176/176]
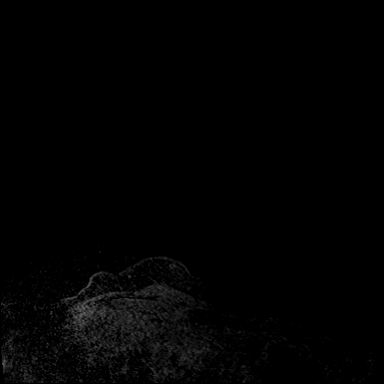

[Series 6: dynamic post 20 · axial · 1.3mm · 0.73mm/px · z∈[-118,+109]mm · 8 of 176 slices shown (2 of 2)]
[im 1/176]
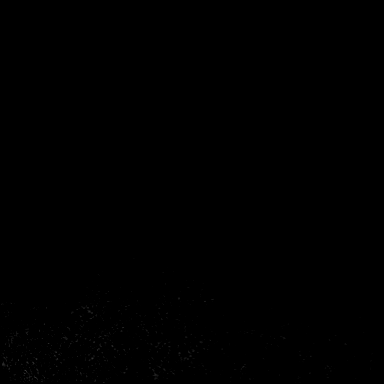
[im 26/176]
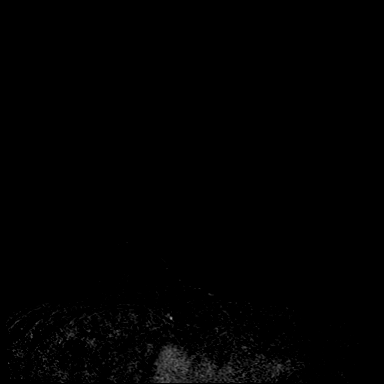
[im 51/176]
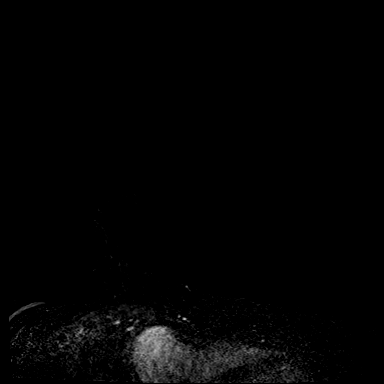
[im 76/176]
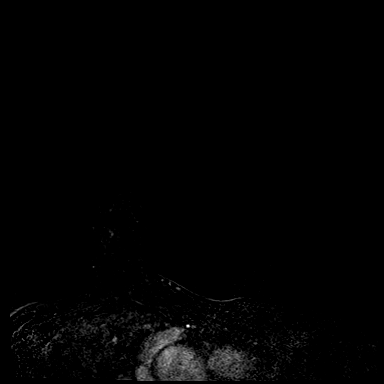
[im 101/176]
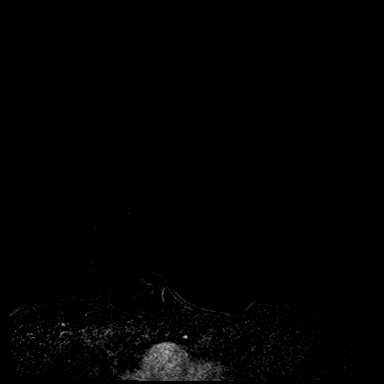
[im 126/176]
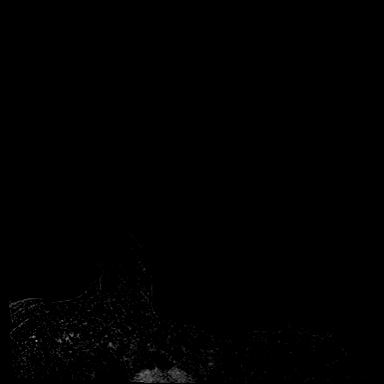
[im 151/176]
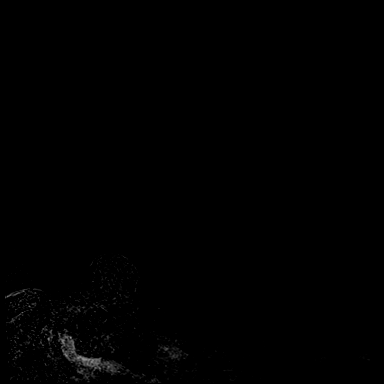
[im 176/176]
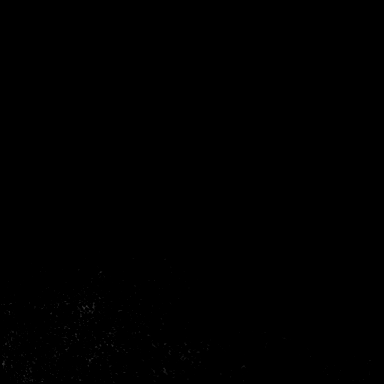

[Series 7: dynamic post 3 · axial · 1.3mm · 0.73mm/px · z∈[-118,+44]mm · 6 of 176 slices shown]
[im 1/176]
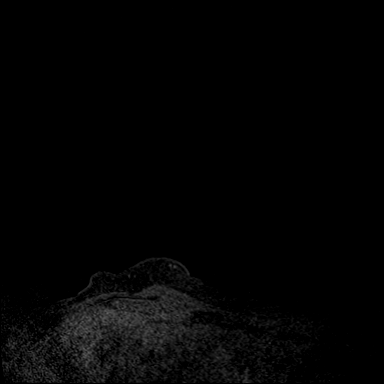
[im 26/176]
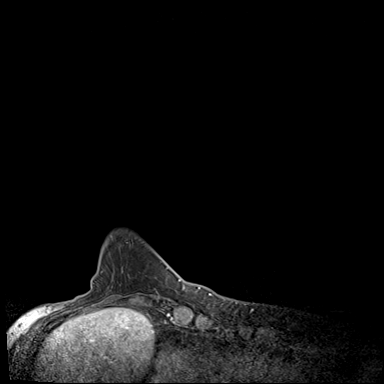
[im 51/176]
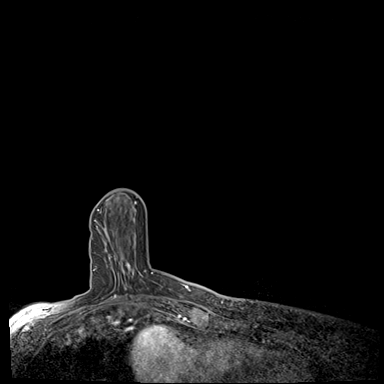
[im 76/176]
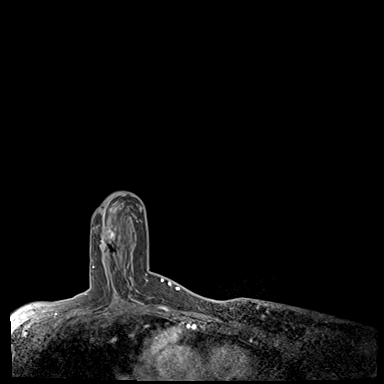
[im 101/176]
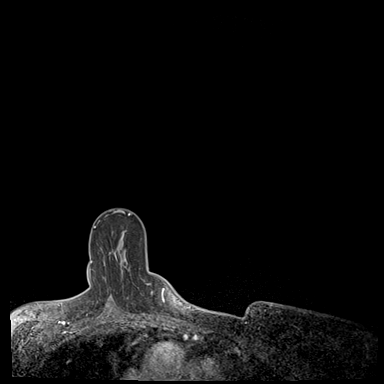
[im 126/176]
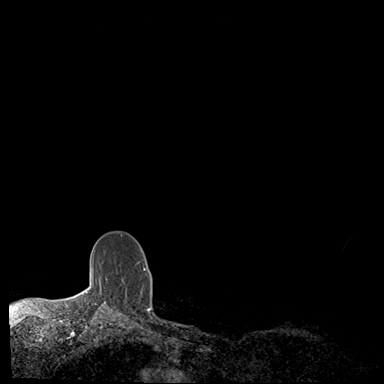

[30 of 48 positions shown; findings below may reference images not displayed]

FINDINGS: I met with the patient, and we discussed the procedure of MRI guided
biopsy, including risks, benefits, and alternatives. Specifically,
we discussed the risks of infection, bleeding, tissue injury, clip
migration, and inadequate sampling. Informed, written consent was
given. The usual time out protocol was performed immediately prior
to the procedure.

Lesion #1: Lower outer quadrant, anterior depth, 4 mm enhancing
mass.

Using sterile technique, 1% Lidocaine, MRI guidance, and a 9 gauge
vacuum assisted device, biopsy was performed of the 4 mm enhancing
mass in the anterior lower outer quadrant using a lateral approach.
At the conclusion of the procedure, a barbell shaped tissue marker
clip was deployed into the biopsy cavity.

Lesion #2: Lower inner quadrant, posterior depth, 4 mm enhancing
mass.

Using sterile technique, 1% Lidocaine, MRI guidance, and a 9 gauge
vacuum assisted device, biopsy was performed of the 4 mm enhancing
mass in the posterior, lower outer quadrant using a lateral
approach. At the conclusion of the procedure, a cylinder shaped
tissue marker clip was deployed into the biopsy cavity

Follow-up 2-view mammogram was performed and dictated separately.
IMPRESSION: MRI guided biopsy of 2 4 mm enhancing right breast masses. No
apparent complications.

ADDENDUM:
Pathology revealed FIBROADENOMA of the Right breast, lower outer
quadrant anterior depth. This was found to be concordant by Dr.
HAEDR.

Pathology revealed FOCAL ATYPICAL DUCTAL HYPERPLASIA ARISING IN A
COMPLEX SCLEROSING LESION WITH CALCIFICATIONS of the Right breast,
lower inner quadrant posterior depth. This was found to be
concordant by Dr. HAEDR, with excision recommended.

Pathology results were discussed with the patient by telephone. The
patient reported doing well after the biopsies with tenderness at
the sites. Post biopsy instructions and care were reviewed and
questions were answered. The patient was encouraged to call The
direct phone number was provided.

Surgical consultation has been arranged with Dr. HAEDR,
[HOSPITAL] location, at [REDACTED] on [DATE].

The patient has biopsy proven PLEOMORPHIC LOBULAR CARCINOMA IN SITU
PARTIALLY INVOLVING A COMPLEX SCLEROSING LESION of the Right breast,
outer anterior, with excision recommended.

Pathology results reported by HAEDR, RN on [DATE].

*** End of Addendum ***
FINDINGS: I met with the patient, and we discussed the procedure of MRI guided
biopsy, including risks, benefits, and alternatives. Specifically,
we discussed the risks of infection, bleeding, tissue injury, clip
migration, and inadequate sampling. Informed, written consent was
given. The usual time out protocol was performed immediately prior
to the procedure.

Lesion #1: Lower outer quadrant, anterior depth, 4 mm enhancing
mass.

Using sterile technique, 1% Lidocaine, MRI guidance, and a 9 gauge
vacuum assisted device, biopsy was performed of the 4 mm enhancing
mass in the anterior lower outer quadrant using a lateral approach.
At the conclusion of the procedure, a barbell shaped tissue marker
clip was deployed into the biopsy cavity.

Lesion #2: Lower inner quadrant, posterior depth, 4 mm enhancing
mass.

Using sterile technique, 1% Lidocaine, MRI guidance, and a 9 gauge
vacuum assisted device, biopsy was performed of the 4 mm enhancing
mass in the posterior, lower outer quadrant using a lateral
approach. At the conclusion of the procedure, a cylinder shaped
tissue marker clip was deployed into the biopsy cavity

Follow-up 2-view mammogram was performed and dictated separately.
IMPRESSION: MRI guided biopsy of 2 4 mm enhancing right breast masses. No
apparent complications.

## 2020-08-12 MED ORDER — GADOBUTROL 1 MMOL/ML IV SOLN
6.0000 mL | Freq: Once | INTRAVENOUS | Status: AC | PRN
  Administered 2020-08-12: 6 mL via INTRAVENOUS

## 2020-08-13 HISTORY — PX: BREAST BIOPSY: SHX20

## 2020-09-04 ENCOUNTER — Other Ambulatory Visit: Payer: Self-pay | Admitting: Hematology

## 2020-09-04 ENCOUNTER — Other Ambulatory Visit: Payer: Self-pay | Admitting: Family Medicine

## 2020-09-22 ENCOUNTER — Other Ambulatory Visit: Payer: Self-pay | Admitting: Family Medicine

## 2020-10-19 ENCOUNTER — Telehealth: Payer: Self-pay | Admitting: Nurse Practitioner

## 2020-10-19 NOTE — Telephone Encounter (Signed)
Moved upcoming appointment due to provider's schedule. Patient is aware of changes.

## 2020-11-11 ENCOUNTER — Encounter (HOSPITAL_COMMUNITY): Payer: Self-pay | Admitting: Emergency Medicine

## 2020-11-11 ENCOUNTER — Other Ambulatory Visit: Payer: Self-pay

## 2020-11-11 ENCOUNTER — Ambulatory Visit (HOSPITAL_COMMUNITY)
Admission: EM | Admit: 2020-11-11 | Discharge: 2020-11-11 | Disposition: A | Discharge status: Home / Self Care | Payer: BC Managed Care – PPO | Attending: Student | Admitting: Student

## 2020-11-11 DIAGNOSIS — R5383 Other fatigue: Secondary | ICD-10-CM

## 2020-11-11 DIAGNOSIS — R42 Dizziness and giddiness: Secondary | ICD-10-CM

## 2020-11-11 DIAGNOSIS — H65191 Other acute nonsuppurative otitis media, right ear: Secondary | ICD-10-CM | POA: Diagnosis not present

## 2020-11-11 DIAGNOSIS — R55 Syncope and collapse: Secondary | ICD-10-CM

## 2020-11-11 LAB — CBG MONITORING, ED: Glucose-Capillary: 126 mg/dL — ABNORMAL HIGH (ref 70–99)

## 2020-11-11 NOTE — ED Triage Notes (Signed)
Pt is present today with right ear crackling and LOC this morning. Pt states that she fainted this morning around 5:30am. Pt states that she did not hit her head or no injury on the way down but does not remember passing out.

## 2020-11-11 NOTE — Discharge Instructions (Addendum)
Recommend continue to monitor symptoms. May take OTC Claritin or Zytec 10mg  once daily as needed. If any continued dizziness occurs or fatigue occurs, call the Cardiologist for further evaluation. If any repeat syncope (passing out) occurs, call 911 and go to the ER ASAP. Otherwise, follow-up with a Cardiologist if needed.

## 2020-11-11 NOTE — ED Provider Notes (Signed)
[place]    CSN: [id number] Arrival date & time: 11/11/20  1035      History   Chief Complaint Chief Complaint  Patient presents with  . Loss of Consciousness  . Otalgia    HPI Martha [name] is a 60 y.o. female.   60 year old female presents with right ear "crackling", slight dizziness and one syncope episode this morning. She was in the [place] this past weekend (about 6 days ago) and noticed her nose started running on the right side. She took an Copywriter, advertising and symptoms resolved. Then 2 days ago, she started to feel more tired and her right ear started to feel more clogged and crackling. No distinct pain except now having some tenderness just below her ear at her neck. This morning she got up from bed, went to the bathroom and was removing her PJ's to use the toilet when she passed out. She fell and landed in between the toilet and the shower. She did not hit her head and no other distinct injury. Her husband heard the large noise and asked if she was okay and she then stood back up and came back into the bedroom. She feels that she only lost consciousness for a few seconds. She continues to feel slightly dizzy and fatigued but no other syncope episodes. She denies any fever, headache, nasal congestion, cough, chest pain, palpations, nausea or vomiting. She went to the dentist this AM for a routine check up and has eaten breakfast. Family members were concerned about her episode and recommended she come to Urgent Care for evaluation. Other chronic health issues include thyroid disorder and history of left breast cancer. Did have a Cardiac ablation procedure about 10 years ago for Tachycardia. Current tobacco user. No ilicit drug use. Currently on Femara, Synthroid, Paxil and Probiotic daily.   The history is provided by the patient.    Past Medical History:  Diagnosis Date  . Cancer [facility]) 2021   right breast  . Cancer ([place])    left breast  .  Dysrhythmia    SVT, s/p ablation ~ 2012 in at [facility]  . History of radiation therapy 04/22/18-06/03/18   Left Breast, left SCV, axilla 50 Gy in 25 fractions, Left breast boost 10 Gy in 5 fractions.   . Personal history of radiation therapy   . PONV (postoperative nausea and vomiting)   . Thyroid disease     Patient Active Problem List   Diagnosis Date Noted  . Lobular carcinoma in situ (LCIS) of right breast 02/04/2020  . Malignant neoplasm of lower-inner quadrant of left breast in female, estrogen receptor positive ([place]) 01/22/2018  . Hypothyroidism 04/04/2017  . Nicotine dependence with current use 04/04/2017  . Excessive drinking of alcohol 04/04/2017  . Menopausal symptom 04/04/2017    Past Surgical History:  Procedure Laterality Date  . APPENDECTOMY    . BREAST BIOPSY Right 2014   fibroadenoma  . BREAST EXCISIONAL BIOPSY Left 1990   benign  . BREAST LUMPECTOMY Left   . BREAST LUMPECTOMY WITH AXILLARY LYMPH NODE BIOPSY Left 02/21/2018   Procedure: LEFT BREAST LUMPECTOMY WITH AXILLARY LYMPH NODE BIOPSY;  Surgeon: [name], MD;  Location: [place];  Service: General;  Laterality: Left;  . BREAST LUMPECTOMY WITH RADIOACTIVE SEED LOCALIZATION Right 03/18/2020   Procedure: RIGHT BREAST LUMPECTOMY WITH RADIOACTIVE SEED LOCALIZATION;  Surgeon: [name], MD;  Location: [place];  Service: General;  Laterality: Right;  . BREAST SURGERY    .  EYE SURGERY    . GLAUCOMA SURGERY Bilateral   . RE-EXCISION OF BREAST LUMPECTOMY Left 03/20/2018   Procedure: RE-EXCISION OF BREAST LUMPECTOMY;  Surgeon: [name], MD;  Location: [place];  Service: General;  Laterality: Left;    OB History   No obstetric history on file.      Home Medications    Prior to Admission medications   Medication Sig Start Date End Date Taking? Authorizing Provider  letrozole (FEMARA) 2.5 MG tablet TAKE 1 TABLET BY MOUTH EVERY DAY 09/06/20   Truitt Merle, MD  levothyroxine  (SYNTHROID) 75 MCG tablet TAKE 1 TABLET (75 MCG TOTAL) BY MOUTH DAILY BEFORE BREAKFAST. 09/07/20   [name], MD  OVER THE COUNTER MEDICATION Take 1 tablet by mouth daily. Protandim Supplement    [provider]  PARoxetine (PAXIL) 20 MG tablet TAKE 1 TABLET (20 MG TOTAL) BY MOUTH DAILY. AS DIRECTED 09/22/20   [name], MD  Probiotic Product (PROBIOTIC PO) Take 1 capsule by mouth daily.    [provider]  triamcinolone ointment (KENALOG) 0.5 % Apply 1 application topically 2 (two) times daily. Patient not taking: Reported on 03/05/2020 02/04/19   [name], MD    Family History Family History  Problem Relation Age of Onset  . Heart disease Mother   . Hyperlipidemia Sister     Social History Social History   Tobacco Use  . Smoking status: Current Every Day Smoker    Packs/day: 1.00    Years: 40.00    Pack years: 40.00  . Smokeless tobacco: Never Used  Vaping Use  . Vaping Use: Former  Substance Use Topics  . Alcohol use: Yes    Alcohol/week: 30.0 standard drinks    Types: 30 Cans of beer per week    Comment: 30 drinks per week   . Drug use: No     Allergies   Patient has no known allergies.   Review of Systems Review of Systems  Constitutional: Positive for fatigue. Negative for activity change, appetite change, chills, diaphoresis and fever.  HENT: Positive for congestion (slight on right side but has resolved), ear pain (right- more "crackling than pain") and facial swelling (right cheek slightly puffy). Negative for ear discharge, hearing loss, mouth sores, nosebleeds, postnasal drip, rhinorrhea, sinus pressure, sinus pain, sneezing, sore throat, tinnitus and trouble swallowing.   Eyes: Negative for photophobia and visual disturbance.  Respiratory: Negative for cough, chest tightness, shortness of breath and wheezing.   Cardiovascular: Negative for chest pain and palpitations.  Gastrointestinal: Negative for nausea and vomiting.   Musculoskeletal: Negative for arthralgias, myalgias, neck pain and neck stiffness.  Skin: Negative for color change and rash.  Allergic/Immunologic: Negative for environmental allergies and food allergies.  Neurological: Positive for dizziness, syncope and light-headedness. Negative for tremors, seizures, facial asymmetry, speech difficulty, weakness, numbness and headaches.  Hematological: Negative for adenopathy. Does not bruise/bleed easily.     Physical Exam Triage Vital Signs ED Triage Vitals  Enc Vitals Group     BP 11/11/20 1045 (!) 148/79     Pulse Rate 11/11/20 1045 (!) 103     Resp 11/11/20 1045 18     Temp 11/11/20 1045 98.7 F (37.1 C)     Temp src --      SpO2 11/11/20 1045 100 %     Weight --      Height --      Head Circumference --      Peak  Flow --      Pain Score 11/11/20 1048 0     Pain Loc --      Pain Edu? --      Excl. in [place]? --    No data found.  Updated Vital Signs BP (!) 148/79   Pulse (!) 103   Temp 98.7 F (37.1 C)   Resp 18   SpO2 100%   Visual Acuity Right Eye Distance:   Left Eye Distance:   Bilateral Distance:    Right Eye Near:   Left Eye Near:    Bilateral Near:     Physical Exam Vitals and nursing note reviewed.  Constitutional:      General: She is awake. She is not in acute distress.    Appearance: Normal appearance. She is well-developed, well-groomed and normal weight. She is not ill-appearing.     Comments: She is sitting comfortably on the exam table in no acute distress, talking in complete sentences and smiling.   HENT:     Head: Normocephalic and atraumatic.     Jaw: There is normal jaw occlusion.     Right Ear: Hearing, ear canal and external ear normal. No drainage or tenderness. A middle ear effusion is present. Tympanic membrane is bulging (slight). Tympanic membrane is not injected, perforated, erythematous or retracted.     Left Ear: Hearing, tympanic membrane, ear canal and external ear normal.     Ears:      Comments: Slight clear to white fluid behind right TM. No pain with movement. No redness.     Nose: Nose normal. No congestion or rhinorrhea.     Right Sinus: No maxillary sinus tenderness or frontal sinus tenderness.     Left Sinus: No maxillary sinus tenderness or frontal sinus tenderness.     Mouth/Throat:     Lips: Pink.     Mouth: Mucous membranes are moist.     Pharynx: Oropharynx is clear. Uvula midline. No pharyngeal swelling, oropharyngeal exudate, posterior oropharyngeal erythema or uvula swelling.  Eyes:     General: Lids are normal.     Extraocular Movements: Extraocular movements intact.     Conjunctiva/sclera: Conjunctivae normal.     Pupils: Pupils are equal, round, and reactive to light.  Neck:     Vascular: No carotid bruit.  Cardiovascular:     Rate and Rhythm: Normal rate and regular rhythm.     Pulses: Normal pulses.     Heart sounds: Normal heart sounds. No murmur heard.     Comments: Heart rate during exam = 90.  Pulmonary:     Effort: Pulmonary effort is normal. No respiratory distress.     Breath sounds: Normal breath sounds and air entry. No decreased air movement. No decreased breath sounds, wheezing, rhonchi or rales.  Musculoskeletal:        General: Normal range of motion.     Cervical back: Normal range of motion and neck supple. No rigidity or tenderness.  Lymphadenopathy:     Cervical: No cervical adenopathy.  Skin:    General: Skin is warm and dry.     Capillary Refill: Capillary refill takes less than 2 seconds.     Findings: No rash.  Neurological:     General: No focal deficit present.     Mental Status: She is alert and oriented to person, place, and time.     Cranial Nerves: Cranial nerves are intact. No cranial nerve deficit.     Sensory: Sensation is intact.  Motor: Motor function is intact.     Coordination: Coordination is intact.     Gait: Gait is intact.     Deep Tendon Reflexes: Reflexes are normal and symmetric.  Psychiatric:         Mood and Affect: Mood normal.        Behavior: Behavior normal. Behavior is cooperative.        Thought Content: Thought content normal.        Judgment: Judgment normal.      UC Treatments / Results  Labs (all labs ordered are listed, but only abnormal results are displayed) Labs Reviewed  CBG MONITORING, ED - Abnormal; Notable for the following components:      Result Value   Glucose-Capillary 126 (*)    All other components within normal limits    EKG   Radiology No results found.  Procedures ED EKG  Date/Time: 11/11/2020 12:56 PM Performed by: Katy Apo, NP Authorized by: Hazel Sams, PA-C   ECG reviewed by ED Physician in the absence of a cardiologist: no   Previous ECG:    Previous ECG:  Compared to current   Comparison ECG info:  No distinct significant changes compared to previous ECG from 2019 Interpretation:    Interpretation: normal   Rate:    ECG rate:  91   ECG rate assessment: normal   Rhythm:    Rhythm: sinus rhythm   Ectopy:    Ectopy: none   QRS:    QRS axis:  Normal ST segments:    ST segments:  Normal Comments:     Reviewed ECG with Merrie Roof, PA. No significant changes in comparison to previous ECG done in 2019. Recommend continue to monitor for any symptoms (syncope probably due to vasovagal) and follow-up with a Cardiologist as discussed.    (including critical care time)  Medications Ordered in UC Medications - No data to display  Initial Impression / Assessment and Plan / UC Course  I have reviewed the triage vital signs and the nursing notes.  Pertinent labs & imaging results that were available during my care of the patient were reviewed by me and considered in my medical decision making (see chart for details).    Finger stick glucose slightly elevated but to be expected since non-fasting. No definite hypoglycemia. Reviewed ECG results with patient. No concerning changes in comparison to previous ECG done in  2019. However, can not completely rule out cardiac cause for syncope. Discussed that episode appears to be a possible Vasovagal response. Lightheadedness may also be related to slight fluid behind TM and mild eustachian tube dysfunction. No distinct ear infection. Recommend continue to monitor symptoms. May take OTC Claritin to Zyrtec 10mg  daily as needed. Patient no longer has Cardiologist but her mom sees Dr. Ellyn Hack. Recommend if dizziness or fatigue continues, call a Cardiologist or Dr. Ellyn Hack, for further evaluation. If any repeat syncope or near syncope occurs and symptoms worsen, call 911 and go to the ER ASAP. Otherwise, follow-up with a Cardiologist as needed.  Final Clinical Impressions(s) / UC Diagnoses   Final diagnoses:  Acute effusion of right ear  Vasovagal syncope  Dizziness, nonspecific  Fatigue, unspecified type     Discharge Instructions     Recommend continue to monitor symptoms. May take OTC Claritin or Zytec 10mg  once daily as needed. If any continued dizziness occurs or fatigue occurs, call the Cardiologist for further evaluation. If any repeat syncope (passing out) occurs, call 911 and go  to the ER ASAP. Otherwise, follow-up with a Cardiologist if needed.     ED Prescriptions    None     PDMP not reviewed this encounter.   Katy Apo, NP 11/12/20 1040

## 2020-12-18 ENCOUNTER — Other Ambulatory Visit: Payer: Self-pay | Admitting: Family Medicine

## 2021-01-20 ENCOUNTER — Other Ambulatory Visit: Payer: Self-pay

## 2021-01-20 ENCOUNTER — Ambulatory Visit
Admission: RE | Admit: 2021-01-20 | Discharge: 2021-01-20 | Disposition: A | Discharge status: Home / Self Care | Payer: BC Managed Care – PPO | Source: Ambulatory Visit | Attending: Hematology | Admitting: Hematology

## 2021-01-20 DIAGNOSIS — Z853 Personal history of malignant neoplasm of breast: Secondary | ICD-10-CM | POA: Diagnosis not present

## 2021-01-20 DIAGNOSIS — C50312 Malignant neoplasm of lower-inner quadrant of left female breast: Secondary | ICD-10-CM

## 2021-01-20 IMAGING — MG DIGITAL DIAGNOSTIC BILAT W/ TOMO W/ CAD
8 of 12 series · 8 of 28 positions shown · non-contrast
Comparison: Previous exam(s).

CLINICAL DATA: 59-year-old female presenting for annual exam.
History of left breast cancer in [EF] status post lumpectomy and
radiation. Patient had a right breast excision for LCIS in [DATE]. She has additional biopsy proven areas of TIGER/LCIS and ADH in
the right breast which have not been excised.

EXAM:
DIGITAL DIAGNOSTIC BILATERAL MAMMOGRAM WITH TOMOSYNTHESIS AND CAD
TECHNIQUE: Bilateral digital diagnostic mammography and breast tomosynthesis
was performed. The images were evaluated with computer-aided
detection.

[R CC]
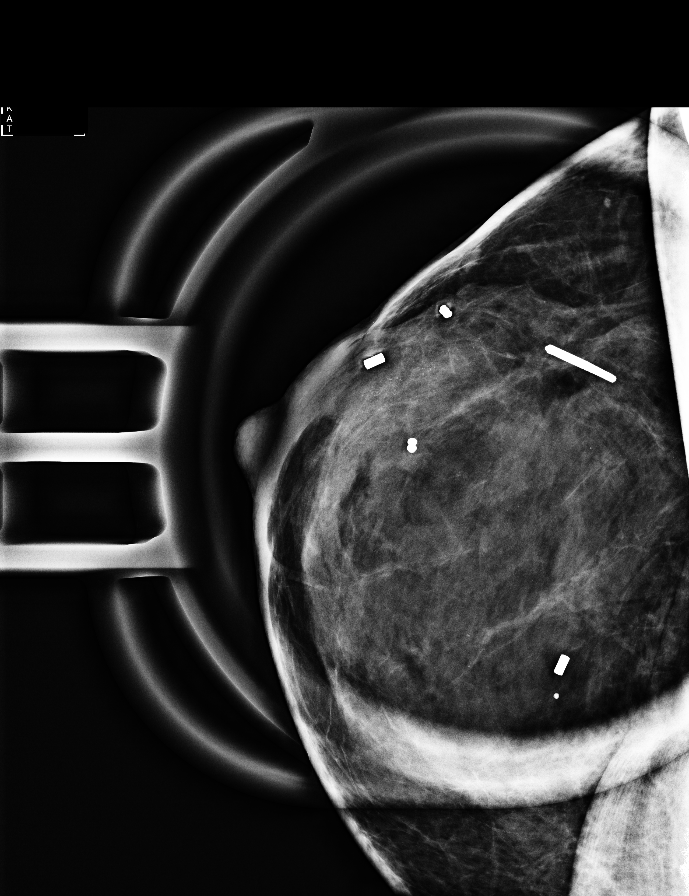

[L CC]
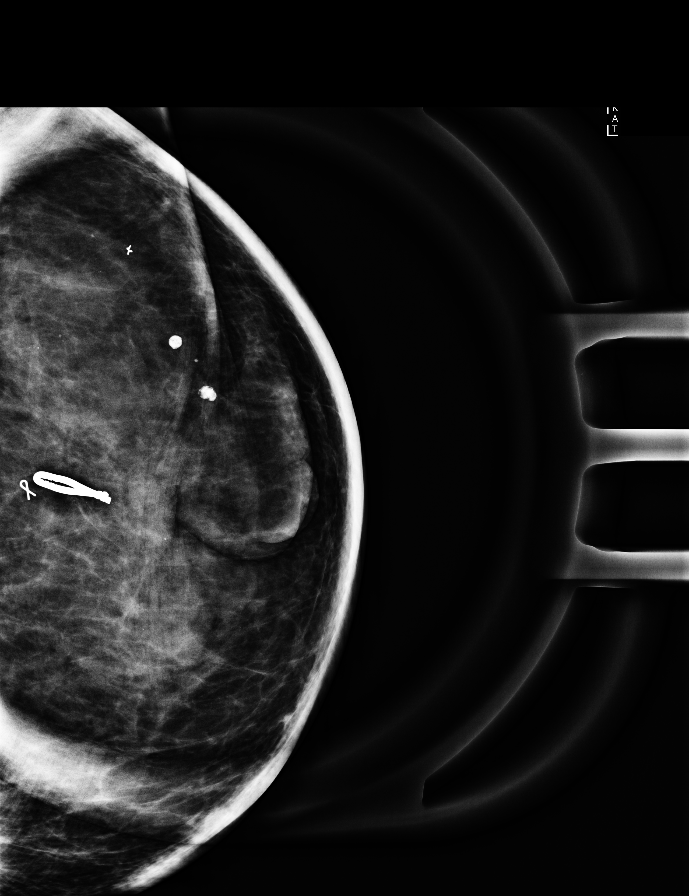

[R ML (1 of 2)]
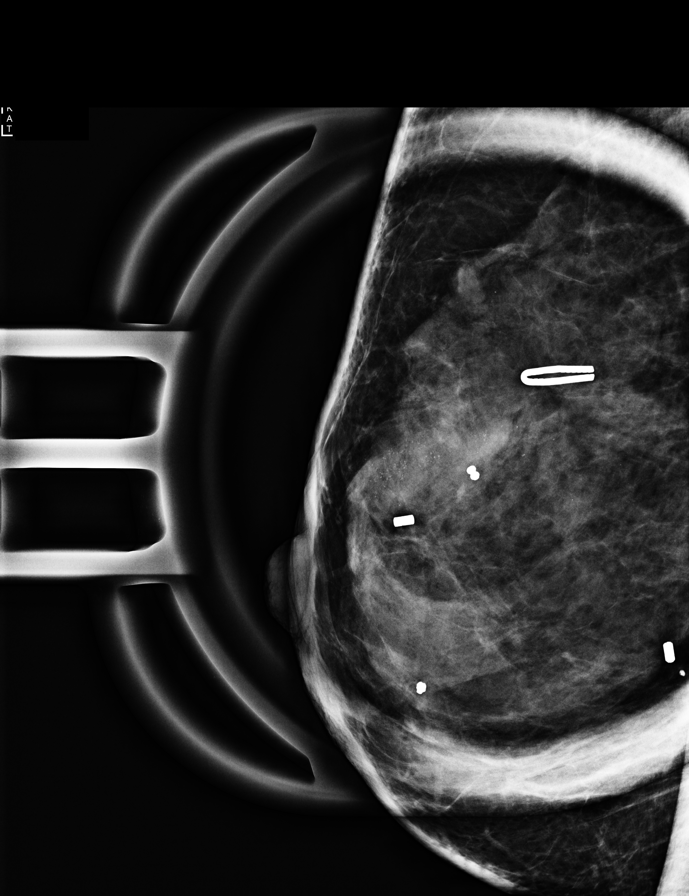

[R ML (2 of 2)]
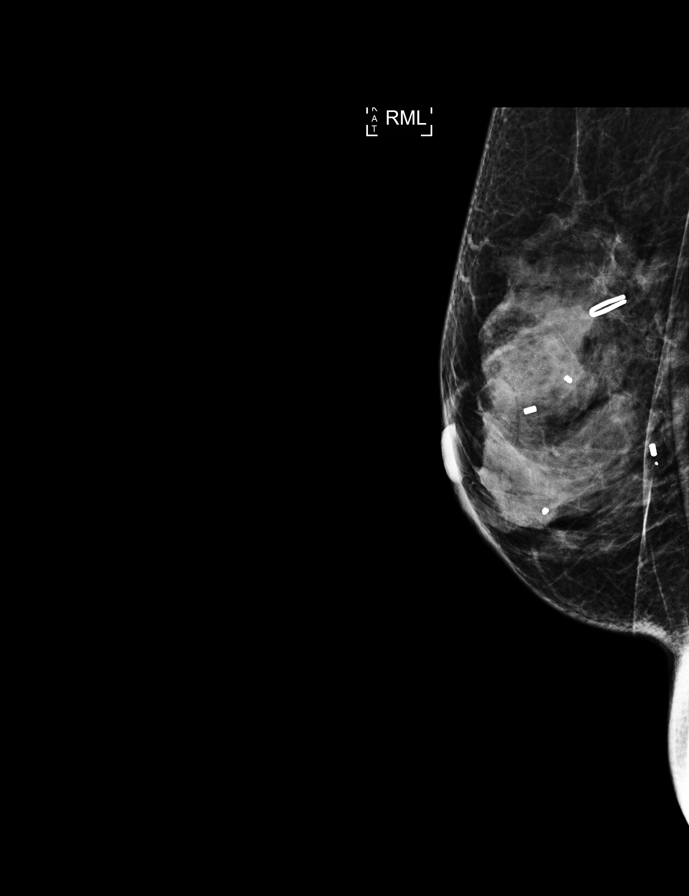

[R MLO synth-2D]
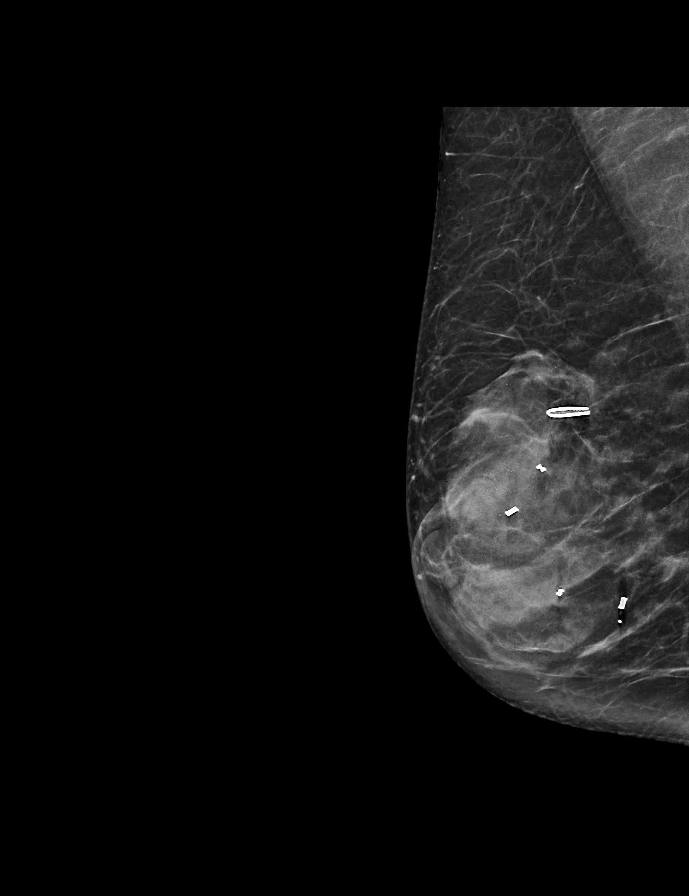

[L MLO synth-2D]
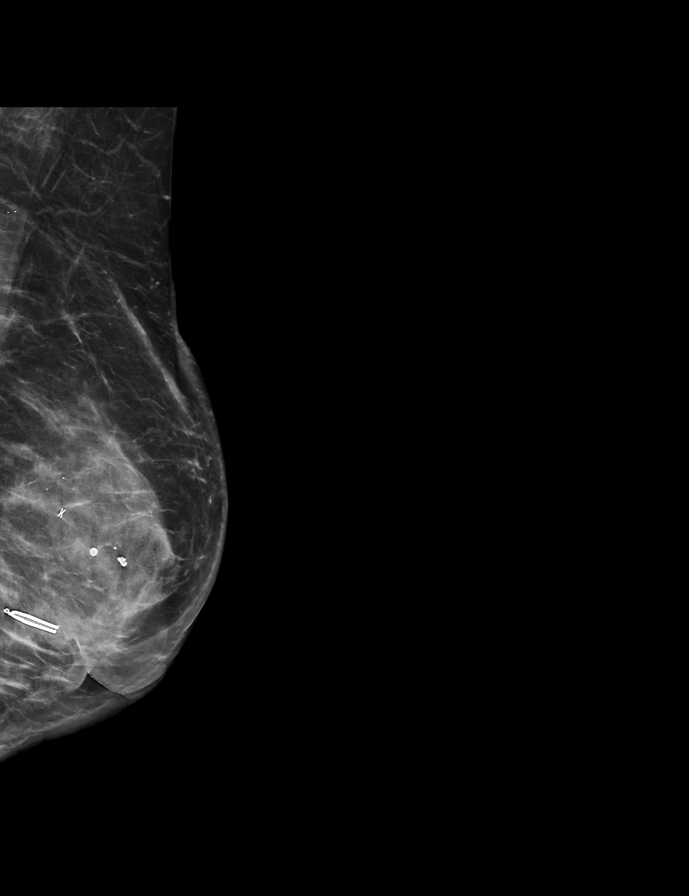

[R CC synth-2D]
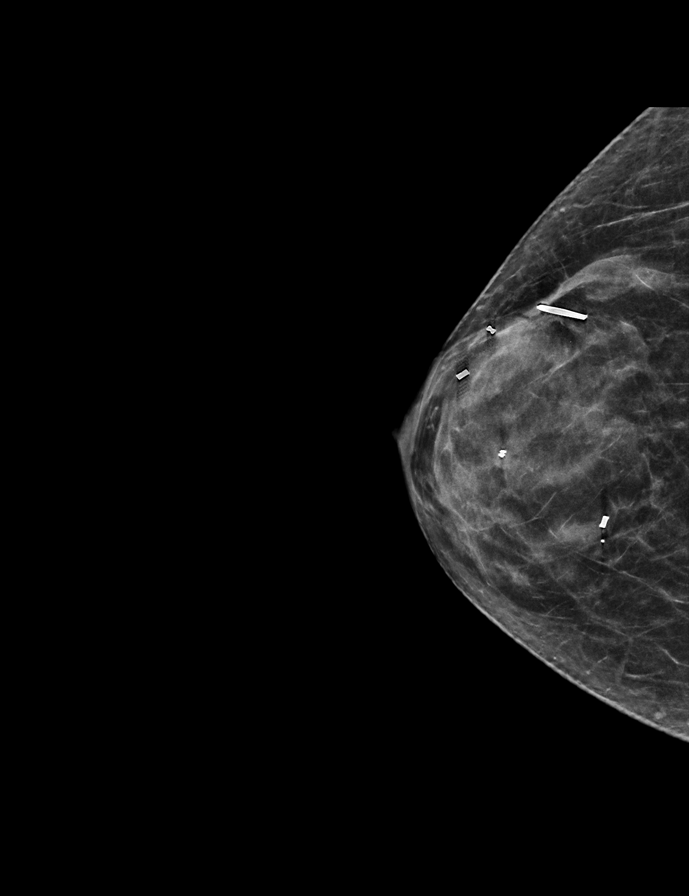

[L CC synth-2D]
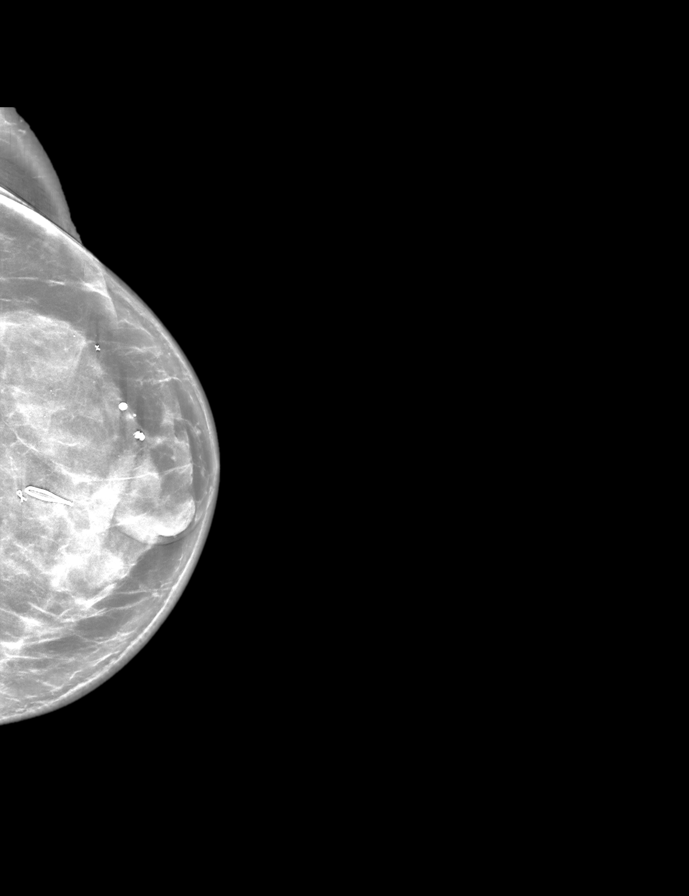

[8 of 28 positions shown; findings below may reference images not displayed]

ACR Breast Density Category c: The breast tissue is heterogeneously
dense, which may obscure small masses.
FINDINGS: Right breast: Spot 2D magnification views were performed in addition
to standard views. In the outer right breast anterior depth there
are suspicious pleomorphic calcifications in a segmental
distribution spanning approximately 2.3 cm, adjacent to the cylinder
and barbell biopsy marking clips. These calcifications are increased
from prior. The remainder of the right breast is stable in
appearance.

Left breast: A spot 2D magnification view of the lumpectomy site was
performed in addition to standard views. There are stable
postsurgical changes. No suspicious mass, distortion, or
microcalcifications are identified to suggest presence of
malignancy.
IMPRESSION: 1. Suspicious pleomorphic calcifications in the outer right breast
spanning 2.3 cm, increased from prior, adjacent to cylinder and
barbell biopsy marking clips. There is biopsy proven LCIS and TIGER
with calcifications at the cylinder clip.

2.  Stable postsurgical changes in the left breast.

RECOMMENDATION:
1. Proceed with recommended surgical excision of the outer right
breast. Recommend bracketed seed localization to include the
anterior and posterior extent of the calcifications.

2. If surgery is not pursued recommend repeat biopsy with
stereotactic guidance of the calcifications in the outer right
breast.

I have discussed the findings and recommendations with the patient.
If applicable, a reminder letter will be sent to the patient
regarding the next appointment.

BI-RADS CATEGORY  4: Suspicious.

## 2021-03-03 ENCOUNTER — Other Ambulatory Visit: Payer: Self-pay | Admitting: Family Medicine

## 2021-03-18 ENCOUNTER — Encounter (HOSPITAL_COMMUNITY): Payer: Self-pay

## 2021-03-18 ENCOUNTER — Other Ambulatory Visit: Payer: Self-pay | Admitting: General Surgery

## 2021-03-18 DIAGNOSIS — R928 Other abnormal and inconclusive findings on diagnostic imaging of breast: Secondary | ICD-10-CM | POA: Diagnosis not present

## 2021-03-18 DIAGNOSIS — C50312 Malignant neoplasm of lower-inner quadrant of left female breast: Secondary | ICD-10-CM | POA: Diagnosis not present

## 2021-03-18 DIAGNOSIS — D0501 Lobular carcinoma in situ of right breast: Secondary | ICD-10-CM | POA: Diagnosis not present

## 2021-03-18 DIAGNOSIS — N6489 Other specified disorders of breast: Secondary | ICD-10-CM | POA: Diagnosis not present

## 2021-03-20 ENCOUNTER — Other Ambulatory Visit: Payer: Self-pay | Admitting: Family Medicine

## 2021-03-25 ENCOUNTER — Other Ambulatory Visit: Payer: Self-pay | Admitting: Family Medicine

## 2021-03-25 MED ORDER — PAROXETINE HCL 20 MG PO TABS: 20.0000 mg | ORAL_TABLET | Freq: Every day | ORAL | 0 refills | Status: DC

## 2021-03-29 ENCOUNTER — Other Ambulatory Visit: Payer: Self-pay | Admitting: General Surgery

## 2021-03-29 DIAGNOSIS — R928 Other abnormal and inconclusive findings on diagnostic imaging of breast: Secondary | ICD-10-CM

## 2021-04-02 ENCOUNTER — Other Ambulatory Visit: Payer: Self-pay | Admitting: Family Medicine

## 2021-04-05 ENCOUNTER — Encounter: Payer: Self-pay | Admitting: Family Medicine

## 2021-04-07 NOTE — Telephone Encounter (Signed)
Patient has been scheduled

## 2021-04-07 NOTE — Telephone Encounter (Signed)
Patient need OV for medication refills  Thanks

## 2021-04-12 ENCOUNTER — Ambulatory Visit (INDEPENDENT_AMBULATORY_CARE_PROVIDER_SITE_OTHER): Payer: BC Managed Care – PPO | Admitting: Family Medicine

## 2021-04-12 ENCOUNTER — Telehealth: Payer: Self-pay | Admitting: Nurse Practitioner

## 2021-04-12 ENCOUNTER — Other Ambulatory Visit: Payer: Self-pay

## 2021-04-12 ENCOUNTER — Encounter: Payer: Self-pay | Admitting: Family Medicine

## 2021-04-12 VITALS — BP 129/62 | HR 78 | Temp 97.2°F | Ht 62.5 in | Wt 116.4 lb

## 2021-04-12 DIAGNOSIS — Z13220 Encounter for screening for lipoid disorders: Secondary | ICD-10-CM | POA: Diagnosis not present

## 2021-04-12 DIAGNOSIS — E039 Hypothyroidism, unspecified: Secondary | ICD-10-CM

## 2021-04-12 DIAGNOSIS — N951 Menopausal and female climacteric states: Secondary | ICD-10-CM | POA: Diagnosis not present

## 2021-04-12 DIAGNOSIS — R739 Hyperglycemia, unspecified: Secondary | ICD-10-CM

## 2021-04-12 DIAGNOSIS — D0501 Lobular carcinoma in situ of right breast: Secondary | ICD-10-CM

## 2021-04-12 LAB — CBC
HCT: 42.5 % (ref 36.0–46.0)
Hemoglobin: 14.6 g/dL (ref 12.0–15.0)
MCHC: 34.2 g/dL (ref 30.0–36.0)
MCV: 97.2 fl (ref 78.0–100.0)
Platelets: 210 10*3/uL (ref 150.0–400.0)
RBC: 4.38 Mil/uL (ref 3.87–5.11)
RDW: 14 % (ref 11.5–15.5)
WBC: 6.4 10*3/uL (ref 4.0–10.5)

## 2021-04-12 LAB — COMPREHENSIVE METABOLIC PANEL
ALT: 14 U/L (ref 0–35)
AST: 19 U/L (ref 0–37)
Albumin: 4.4 g/dL (ref 3.5–5.2)
Alkaline Phosphatase: 84 U/L (ref 39–117)
BUN: 10 mg/dL (ref 6–23)
CO2: 28 mEq/L (ref 19–32)
Calcium: 9.8 mg/dL (ref 8.4–10.5)
Chloride: 100 mEq/L (ref 96–112)
Creatinine, Ser: 0.61 mg/dL (ref 0.40–1.20)
GFR: 97.4 mL/min (ref >60.00)
Glucose, Bld: 91 mg/dL (ref 70–99)
Potassium: 4 mEq/L (ref 3.5–5.1)
Sodium: 138 mEq/L (ref 135–145)
Total Bilirubin: 0.5 mg/dL (ref 0.2–1.2)
Total Protein: 7.3 g/dL (ref 6.0–8.3)

## 2021-04-12 LAB — LIPID PANEL
Cholesterol: 172 mg/dL (ref 0–200)
HDL: 75.1 mg/dL (ref >39.00)
LDL Cholesterol: 82 mg/dL (ref 0–99)
NonHDL: 96.46
Total CHOL/HDL Ratio: 2
Triglycerides: 74 mg/dL (ref 0.0–149.0)
VLDL: 14.8 mg/dL (ref 0.0–40.0)

## 2021-04-12 LAB — TSH: TSH: 1.28 u[IU]/mL (ref 0.35–5.50)

## 2021-04-12 LAB — HEMOGLOBIN A1C: Hgb A1c MFr Bld: 5.7 % (ref 4.6–6.5)

## 2021-04-12 MED ORDER — CITALOPRAM HYDROBROMIDE 20 MG PO TABS: 20.0000 mg | ORAL_TABLET | Freq: Every day | ORAL | 3 refills | Status: DC

## 2021-04-12 MED ORDER — LEVOTHYROXINE SODIUM 75 MCG PO TABS: 75.0000 ug | ORAL_TABLET | Freq: Every day | ORAL | 3 refills | Status: DC

## 2021-04-12 NOTE — Assessment & Plan Note (Signed)
Follows with oncology and surgery.  Status post excision.

## 2021-04-12 NOTE — Progress Notes (Signed)
   Martha [name] is a 60 y.o. female who presents today for an office visit.  Assessment/Plan:  Chronic Problems Addressed Today: Menopausal symptom She is on Paxil 20 mg daily.  Has had some issues with discontinuation syndrome at this.  We will switch over to Celexa 20 mg daily as this likely will have less side effects for her.  She will send a message in a few weeks to let me know how this is working.  Hypothyroidism Check TSH.  We will continue Synthroid 75 mcg daily.  Lobular carcinoma in situ (LCIS) of right breast Follows with oncology and surgery.  Status post excision.  Preventative health care Check labs today.  Due for Pap smear.  Due for colon cancer screening next year.    Subjective:  HPI:  See a/p.         Objective:  Physical Exam: BP 129/62   Pulse 78   Temp (!) 97.2 F (36.2 C) (Temporal)   Ht 5' 2.5" (1.588 m)   Wt 116 lb 6.4 oz (52.8 kg)   SpO2 100%   BMI 20.95 kg/m   Gen: No acute distress, resting comfortably CV: Regular rate and rhythm with no murmurs appreciated Pulm: Normal work of breathing, clear to auscultation bilaterally with no crackles, wheezes, or rhonchi Neuro: Grossly normal, moves all extremities Psych: Normal affect and thought content      Timi Reeser M. [name], MD 04/12/2021 1:48 PM

## 2021-04-12 NOTE — Patient Instructions (Signed)
It was very nice to see you today!  We will check blood work today.  We will switch from Paxil to Celexa.  Please send me a message in a few weeks to let me know how this is working for you.  I will see back in year.  Please come back to see me sooner if needed.  Take care, Dr [name]  PLEASE NOTE:  If you had any lab tests please let us know if you have not heard back within a few days. You may see your results on mychart before we have a chance to review them but we will give you a call once they are reviewed by [place]. If we ordered any referrals today, please let us know if you have not heard from their office within the next week.   Please try these tips to maintain a healthy lifestyle:  Eat at least 3 REAL meals and 1-2 snacks per day.  Aim for no more than 5 hours between eating.  If you eat breakfast, please do so within one hour of getting up.   Each meal should contain half fruits/vegetables, one quarter protein, and one quarter carbs (no bigger than a computer mouse)  Cut down on sweet beverages. This includes juice, soda, and sweet tea.   Drink at least 1 glass of water with each meal and aim for at least 8 glasses per day  Exercise at least 150 minutes every week.

## 2021-04-12 NOTE — Assessment & Plan Note (Signed)
She is on Paxil 20 mg daily.  Has had some issues with discontinuation syndrome at this.  We will switch over to Celexa 20 mg daily as this likely will have less side effects for her.  She will send a message in a few weeks to let me know how this is working.

## 2021-04-12 NOTE — Assessment & Plan Note (Signed)
Check TSH.  We will continue Synthroid 75 mcg daily.

## 2021-04-12 NOTE — Telephone Encounter (Signed)
Left message with rescheduled upcoming appointment due to provider's template change. 

## 2021-04-14 ENCOUNTER — Encounter: Payer: Self-pay | Admitting: Family Medicine

## 2021-04-14 DIAGNOSIS — R739 Hyperglycemia, unspecified: Secondary | ICD-10-CM | POA: Insufficient documentation

## 2021-04-14 NOTE — Progress Notes (Signed)
Please inform patient of the following:  Blood sugar borderline elevated but stable.  Everything else is normal.  Do not need to make any changes to treatment plan at this time.  She should continue working on diet and exercise and we can recheck in a year or so.

## 2021-04-22 ENCOUNTER — Encounter (HOSPITAL_BASED_OUTPATIENT_CLINIC_OR_DEPARTMENT_OTHER): Payer: Self-pay | Admitting: General Surgery

## 2021-04-22 ENCOUNTER — Other Ambulatory Visit: Payer: Self-pay

## 2021-04-27 ENCOUNTER — Other Ambulatory Visit: Payer: Self-pay

## 2021-04-27 ENCOUNTER — Ambulatory Visit
Admission: RE | Admit: 2021-04-27 | Discharge: 2021-04-27 | Disposition: A | Discharge status: Home / Self Care | Payer: BC Managed Care – PPO | Source: Ambulatory Visit | Attending: General Surgery | Admitting: General Surgery

## 2021-04-27 ENCOUNTER — Other Ambulatory Visit: Payer: Self-pay | Admitting: General Surgery

## 2021-04-27 DIAGNOSIS — R928 Other abnormal and inconclusive findings on diagnostic imaging of breast: Secondary | ICD-10-CM

## 2021-04-27 DIAGNOSIS — R921 Mammographic calcification found on diagnostic imaging of breast: Secondary | ICD-10-CM | POA: Diagnosis not present

## 2021-04-27 IMAGING — MG MM PLC BREAST LOC DEV 1ST LESION INC*R*
8 of 10 series · 8 of 10 positions shown · non-contrast
Comparison: Previous exam(s).

CLINICAL DATA: 60-year-old female for 2 radioactive seed bracketing
of suspicious UPPER-OUTER RIGHT breast calcifications (which
includes the CYLINDER clip at site of previous lobular neoplasia/ZULLY
biopsy) and radioactive seed localization of LOWER INNER RIGHT
breast ADH (2nd CYLINDER clip), prior to surgical excisions

EXAM:
MAMMOGRAPHIC GUIDED RADIOACTIVE SEED LOCALIZATION OF THE RIGHT
BREAST X 3

[R CC (1 of 4)]
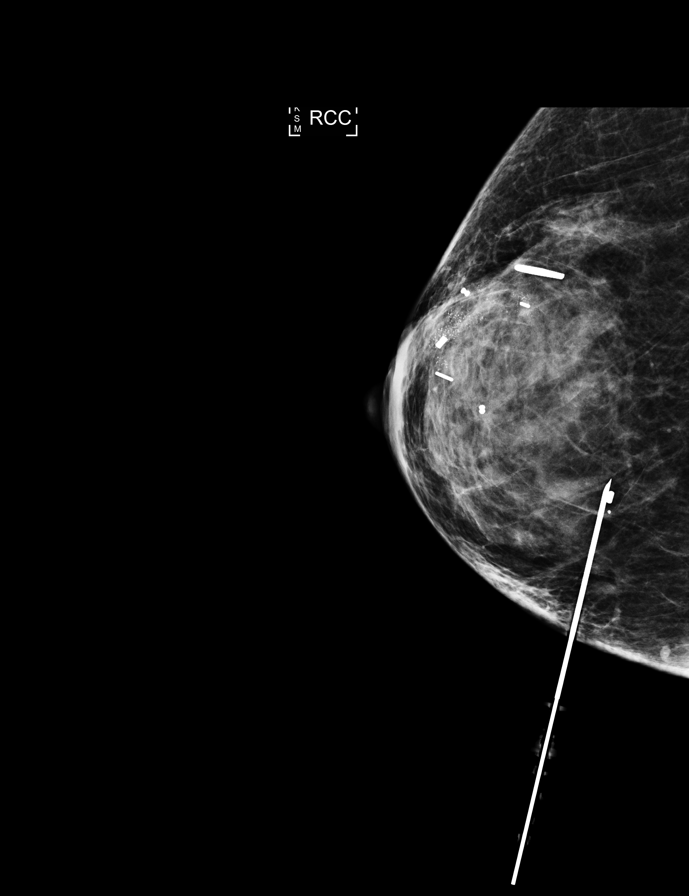

[R CC (2 of 4)]
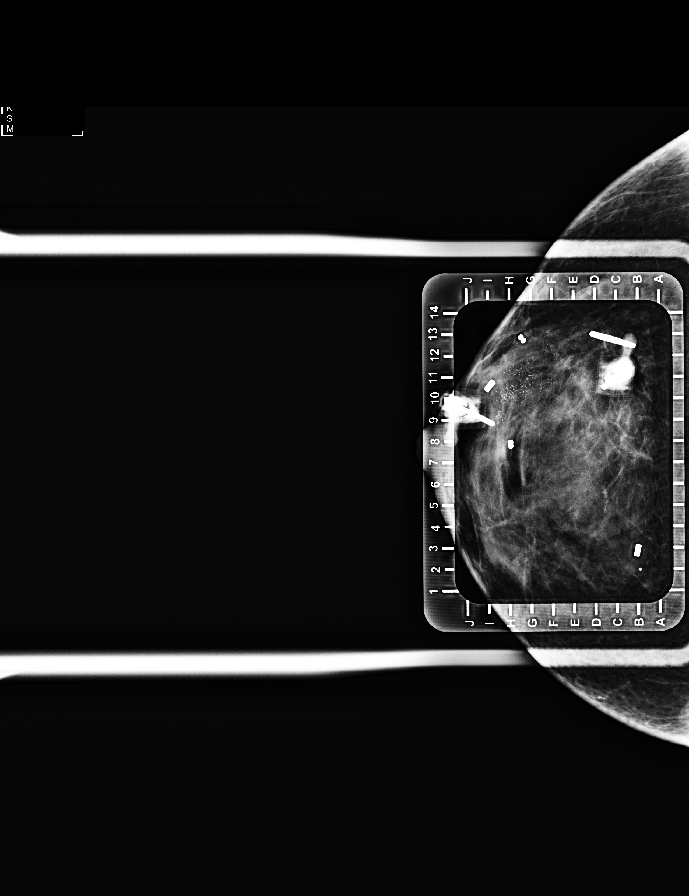

[R CC (3 of 4)]
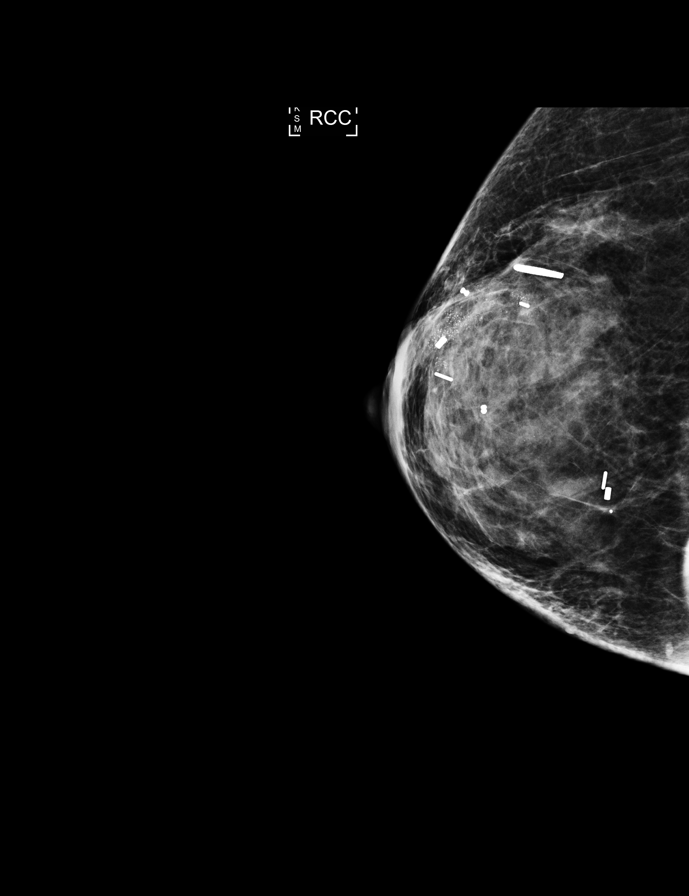

[R ML (1 of 3)]
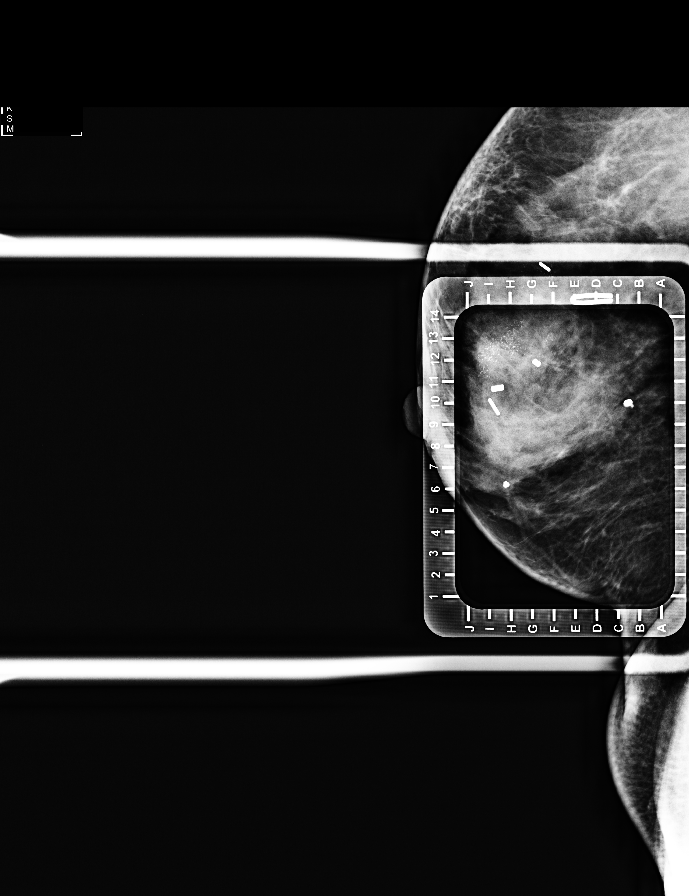

[R CC (4 of 4)]
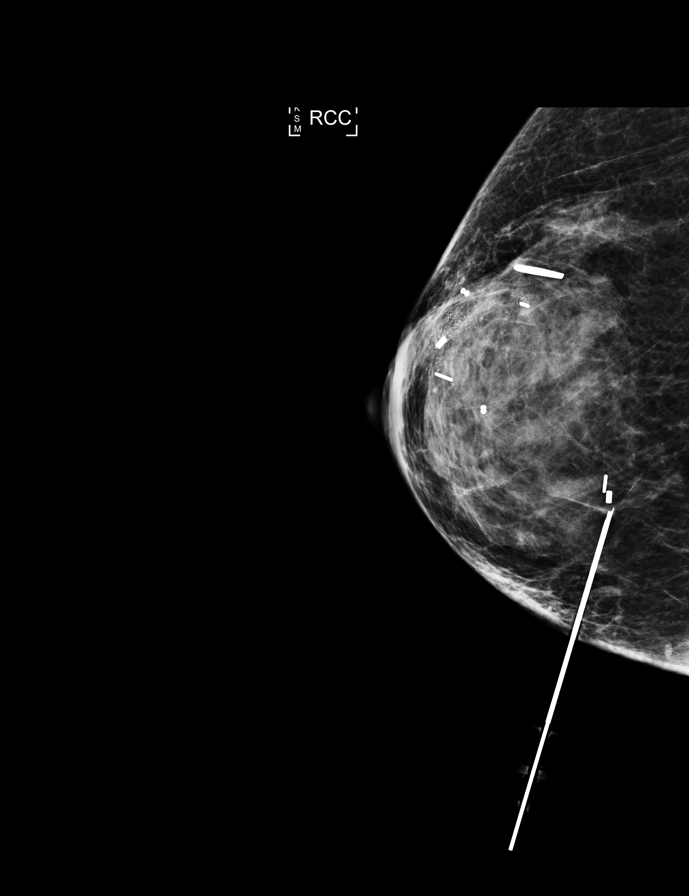

[R ML (2 of 3)]
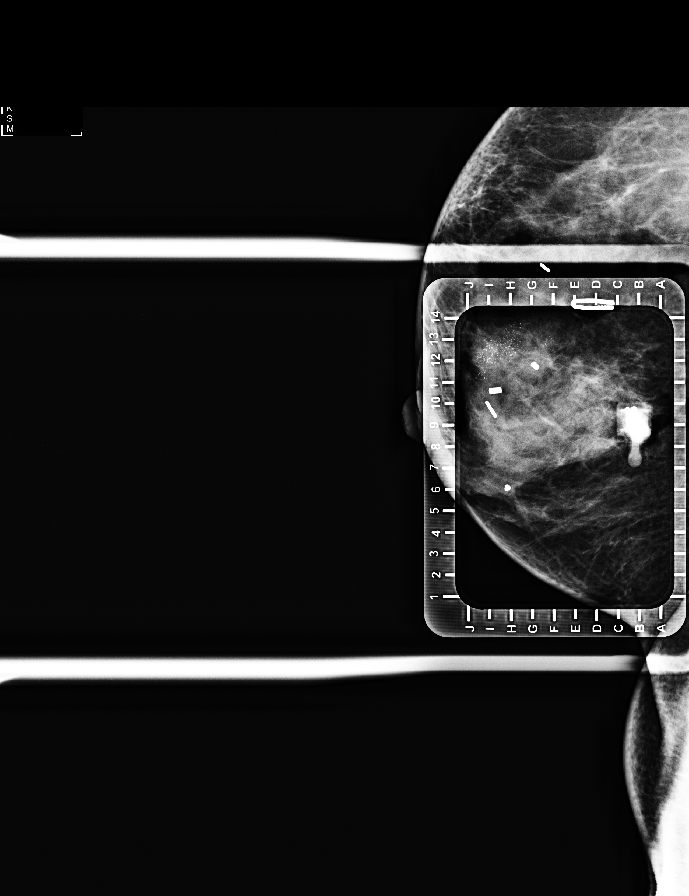

[R LM]
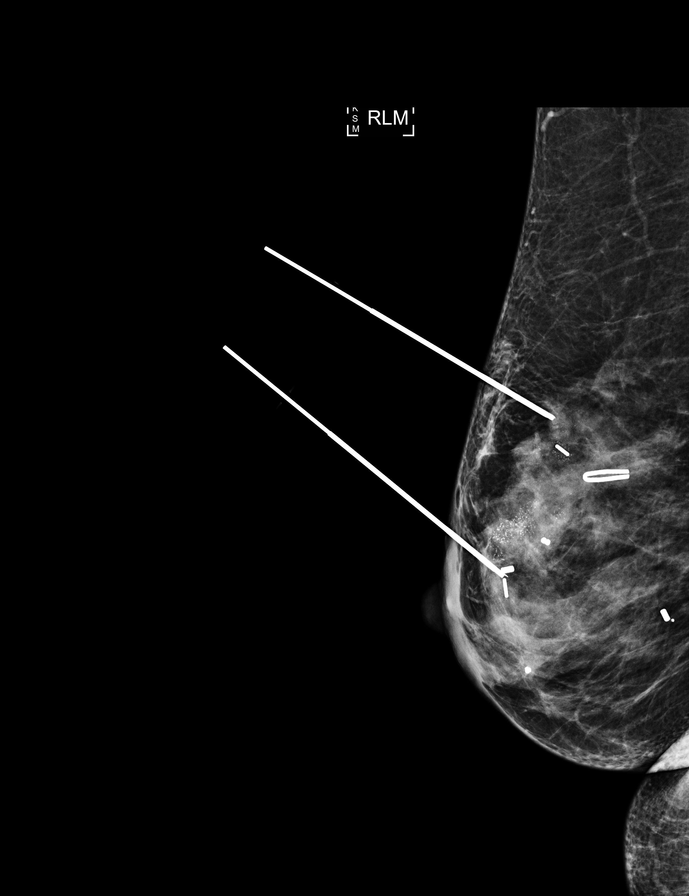

[R ML (3 of 3)]
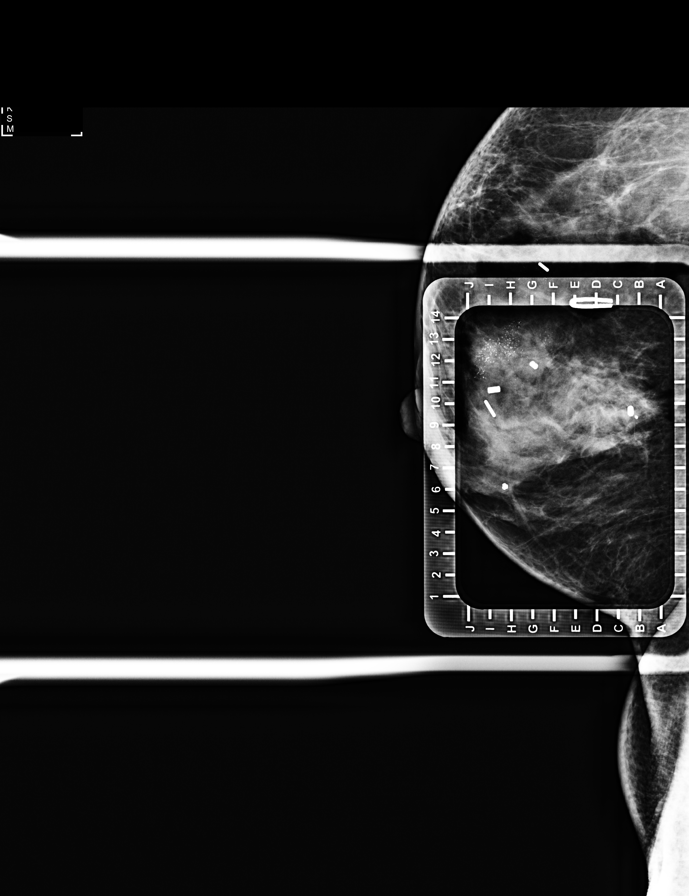

[8 of 10 positions shown; findings below may reference images not displayed]

FINDINGS: Patient presents for 3 radioactive seed localizations prior to RIGHT
breast excisions. I met with the patient and we discussed the
procedure of seed localization including benefits and alternatives.
We discussed the high likelihood of successful procedures. We
discussed the risks of the procedure including infection, bleeding,
tissue injury and further surgery. We discussed the low dose of
radioactivity involved in the procedures. Informed, written consent
was given.

The usual time-out protocol was performed immediately prior to the
procedures.

MAMMOGRAPHIC GUIDED RADIOACTIVE SEED LOCALIZATION OF THE RIGHT
BREAST (anterior extent of UPPER-OUTER RIGHT breast calcifications
and near 1st CYLINDER clip):

Using mammographic guidance, sterile technique, 1% lidocaine and an
[EA] radioactive seed, anterior extent of UPPER-OUTER RIGHT breast
calcifications/CYLINDER clip was localized using a SUPERIOR
approach. The follow-up mammogram images confirm the seed in the
expected location and were marked for Dr. ZULLY.

Follow-up survey of the patient confirms presence of the radioactive
seed.

Order number of [EA] seed:  [PHONE_NUMBER].

Total activity:  0.245 millicuries.  Reference Date: [DATE]

MAMMOGRAPHIC GUIDED RADIOACTIVE SEED LOCALIZATION OF THE RIGHT
BREAST (posterior extent of UPPER-OUTER RIGHT breast
calcifications):

Using mammographic guidance, sterile technique, 1% lidocaine and an
[EA] radioactive seed, posterior extent of UPPER-OUTER RIGHT breast
calcifications was localized using a SUPERIOR approach. The
follow-up mammogram images confirm the seed in the expected location
and were marked for Dr. ZULLY.

Follow-up survey of the patient confirms presence of the radioactive
seed.

Order number of [EA] seed:  [PHONE_NUMBER].

Total activity:  0.245 millicuries.  Reference Date: [DATE]

MAMMOGRAPHIC GUIDED RADIOACTIVE SEED LOCALIZATION OF THE RIGHT
BREAST (LOWER INNER RIGHT breast ADH/2nd CYLINDER clip):

Using mammographic guidance, sterile technique, 1% lidocaine and an
[EA] radioactive seed, the CYLINDER clip in the LOWER INNER RIGHT
breast was localized using a MEDIAL approach. The follow-up
mammogram images confirm the seed in the expected location and were
marked for Dr. ZULLY.

Follow-up survey of the patient confirms presence of the radioactive
seed.

Order number of [EA] seed:  [PHONE_NUMBER].

Total activity:  0.245 millicuries.  Reference Date: [DATE]

The patient tolerated the procedure well and was released from the
[REDACTED]. She was given instructions regarding seed removal.
IMPRESSION: Three radioactive seed localizations of the RIGHT breast. No
apparent complications.

## 2021-04-27 NOTE — H&P (Signed)
History of Present Illness: Martha [name] is a 60 y.o. female who is seen today for follow up of breast cancer.     Pt is a 60 yo F who was diagnosed with left breast cancer June 2019.  She presented with a palpable left breast mass.  She then had diagnostic imaging that showed several findings.  She had a 1.4 cm mass at 7 o'clock and one at 7:30. There were also calcifications seen that were at 3 o'clock that were benign.  She has no prior cancer history.  She has no family history of cancer. She denies breast pain or nipple discharge.  She feels well overall.  She took HRT for 1 year in the form of a combipatch, but stopped.  She is a G2P0 wtih 2 ectopic pregnancies.  menarche was age 25.  She is a current smoker.  She drinks around 5 drinks per week.     She underwent lumpectomy/SLN 02/21/18.  Two margins were positive.  She was taken for reexcision 7/24 and final margins were clear.  Final pathology was pT1cN1, +/+/-.  She started radiation.  She developed an infection and started having drainage.  She required multiple courses of antibiotics and took a bit to heal.  She has been on letrozole.     When she got her 2021 mammogram, she had suspicious calcs in the upper outer quadrant of the breast.  Biopsy showed LCIS.  We discussed bilateral mastectomies and she saw Dr. Iran Planas, but wasn't excited about a latissimus flap on the left.  We therefore did a lumpectomy on the right 03/18/2020.  She healed, but did develop contraction and scar discomfort on the right.     She then had an MRI because of her breast density and high risk from the LCIS finding.  Unfortunately, she had two concerning spots on the right.  Core needle biopsy was done on a LOQ lesion and a LIQ lesion.  The LOQ was benign (fibroadenoma), and he LIQ lesion was focal ADH in a CSL.  She was referred to discuss excision.     At that time, she was leery given the complications she had had.  She wanted to follow it.  However, on  repeat mammogram in may, the area of calcifications enlarged.  She is willing to excise now.  Also, she continues on letrozole.   bilat mammogram 01/20/21 CLINICAL DATA:  60 year old female presenting for annual exam. History of left breast cancer in 2019 status post lumpectomy and radiation. Patient had a right breast excision for LCIS in July 2021. She has additional biopsy proven areas of CSL/LCIS and ADH in the right breast which have not been excised.   EXAM: DIGITAL DIAGNOSTIC BILATERAL MAMMOGRAM WITH TOMOSYNTHESIS AND CAD   TECHNIQUE: Bilateral digital diagnostic mammography and breast tomosynthesis was performed. The images were evaluated with computer-aided detection.   COMPARISON:  Previous exam(s).   ACR Breast Density Category c: The breast tissue is heterogeneously dense, which may obscure small masses.   FINDINGS: Right breast: Spot 2D magnification views were performed in addition to standard views. In the outer right breast anterior depth there are suspicious pleomorphic calcifications in a segmental distribution spanning approximately 2.3 cm, adjacent to the cylinder and barbell biopsy marking clips. These calcifications are increased from prior. The remainder of the right breast is stable in appearance.   Left breast: A spot 2D magnification view of the lumpectomy site was performed in addition to standard views. There are  stable postsurgical changes. No suspicious mass, distortion, or microcalcifications are identified to suggest presence of malignancy.   IMPRESSION: 1. Suspicious pleomorphic calcifications in the outer right breast spanning 2.3 cm, increased from prior, adjacent to cylinder and barbell biopsy marking clips. There is biopsy proven LCIS and CSL with calcifications at the cylinder clip.   2.  Stable postsurgical changes in the left breast.   RECOMMENDATION: 1. Proceed with recommended surgical excision of the outer right breast.  Recommend bracketed seed localization to include the anterior and posterior extent of the calcifications.   2. If surgery is not pursued recommend repeat biopsy with stereotactic guidance of the calcifications in the outer right breast.   I have discussed the findings and recommendations with the patient. If applicable, a reminder letter will be sent to the patient regarding the next appointment.   BI-RADS CATEGORY  4: Suspicious.     Review of Systems: A complete review of systems was obtained from the patient.  I have reviewed this information and discussed as appropriate with the patient.  See HPI as well for other ROS.   Review of Systems  All other systems reviewed and are negative.       Medical History: Past Medical History  No past medical history on file.        Patient Active Problem List  Diagnosis   Lobular carcinoma in situ (LCIS) of right breast   Malignant neoplasm of lower-inner quadrant of left breast in female, estrogen receptor positive (CMS-[name])   Complex sclerosing lesion of right breast   Abnormal mammogram of right breast      Past Surgical History  No past surgical history on file.      Allergies  No Known Allergies           Current Outpatient Medications on File Prior to Visit  Medication Sig Dispense Refill   letrozole (FEMARA) 2.5 mg tablet Take 2.5 mg by mouth once daily       levothyroxine (SYNTHROID) 75 MCG tablet TAKE 1 TABLET BY MOUTH DAILY BEFORE BREAKFAST.       PARoxetine (PAXIL) 20 MG tablet TAKE 1 TABLET (20 MG TOTAL) BY MOUTH DAILY. AS DIRECTED        No current facility-administered medications on file prior to visit.      Family History  No family history on file.      Social History       Tobacco Use  Smoking Status Not on file  Smokeless Tobacco Not on file      Social History        Objective:         Vitals:    03/18/21 1126  Weight: 53.4 kg (117 lb 12.8 oz)  Height: 157.5 cm ('5\' 2"'$ )    Body  mass index is 21.55 kg/m.     Head:   Normocephalic and atraumatic.  Eyes:    Conjunctivae are normal. Pupils are equal, round, and reactive to light. No scleral icterus.  Neck:   Normal range of motion. Neck supple. No tracheal deviation present. No thyromegaly present.  Resp:   No respiratory distress, normal effort. Breast:left breast lifted/contracted from XRT. Nipple downward pointing (not changed). Right breast without masses or skin dimpling.  No nipple retraction.  No nipple discharge.   Skin: very tan, no rashes seen.  Neurological: Alert and oriented to person, place, and time. Coordination normal.  Skin:    Skin is warm and dry. No rash noted. No  diaphoretic. No erythema. No pallor.  Psychiatric: Normal mood and affect. Normal behavior. Judgment and thought content normal.          Assessment and Plan:  Diagnoses and all orders for this visit:   Lobular carcinoma in situ (LCIS) of right breast Assessment & Plan: Continue letrozole     Malignant neoplasm of lower-inner quadrant of left breast in female, estrogen receptor positive (CMS-[name]) Assessment & Plan: Left breast clear. See plan for right breast. Continue letrozole.     Follow up will be determined by right sided excision.     Complex sclerosing lesion of right breast   Abnormal mammogram of right breast Assessment & Plan: Will plan bracketed excision of the calcs that include the prior CSL.     The surgical procedure was described to the patient.  I discussed the incision type and location and that we would need radiology involved on with a wire or seed marker and/or sentinel node.       The risks and benefits of the procedure were described to the patient and she wishes to proceed.     We discussed the risks bleeding, infection, damage to other structures, need for further procedures/surgeries.  We discussed the risk of seroma.  The patient was advised if the area in the breast in cancer, we may need to  go back to surgery for additional tissue to obtain negative margins or for a lymph node biopsy. The patient was advised that these are the most common complications, but that others can occur as well.  They were advised against taking aspirin or other anti-inflammatory agents/blood thinners the week before surgery.          Georgianne Fick, MD

## 2021-04-27 NOTE — Anesthesia Preprocedure Evaluation (Addendum)
Anesthesia Evaluation  Patient identified by MRN, date of birth, ID band Patient awake    Reviewed: Allergy & Precautions, NPO status , Patient's Chart, lab work & pertinent test results  History of Anesthesia Complications (+) PONV and history of anesthetic complications  Airway Mallampati: I  TM Distance: >3 FB Neck ROM: Full    Dental no notable dental hx.    Pulmonary Current Smoker and Patient abstained from smoking.,    Pulmonary exam normal        Cardiovascular Normal cardiovascular exam  Hx of SVT s/p ablation (2012)   Neuro/Psych Anxiety Depression negative neurological ROS     GI/Hepatic negative GI ROS, Neg liver ROS,   Endo/Other  Hypothyroidism   Renal/GU negative Renal ROS  negative genitourinary   Musculoskeletal negative musculoskeletal ROS (+)   Abdominal   Peds  Hematology negative hematology ROS (+)   Anesthesia Other Findings Hx of left breast ca  Reproductive/Obstetrics negative OB ROS                            Anesthesia Physical Anesthesia Plan  ASA: 2  Anesthesia Plan: General   Post-op Pain Management:    Induction: Intravenous  PONV Risk Score and Plan: 4 or greater and Treatment may vary due to age or medical condition, Ondansetron, Dexamethasone, Midazolam and Propofol infusion  Airway Management Planned: LMA  Additional Equipment: None  Intra-op Plan:   Post-operative Plan: Extubation in OR  Informed Consent: I have reviewed the patients History and Physical, chart, labs and discussed the procedure including the risks, benefits and alternatives for the proposed anesthesia with the patient or authorized representative who has indicated his/her understanding and acceptance.     Dental advisory given  Plan Discussed with: CRNA  Anesthesia Plan Comments:        Anesthesia Quick Evaluation

## 2021-04-28 ENCOUNTER — Ambulatory Visit
Admission: RE | Admit: 2021-04-28 | Discharge: 2021-04-28 | Disposition: A | Discharge status: Home / Self Care | Payer: BC Managed Care – PPO | Source: Ambulatory Visit | Attending: General Surgery | Admitting: General Surgery

## 2021-04-28 ENCOUNTER — Ambulatory Visit (HOSPITAL_BASED_OUTPATIENT_CLINIC_OR_DEPARTMENT_OTHER): Payer: BC Managed Care – PPO | Admitting: Anesthesiology

## 2021-04-28 ENCOUNTER — Other Ambulatory Visit: Payer: Self-pay

## 2021-04-28 ENCOUNTER — Ambulatory Visit (HOSPITAL_BASED_OUTPATIENT_CLINIC_OR_DEPARTMENT_OTHER)
Admission: RE | Admit: 2021-04-28 | Discharge: 2021-04-28 | Disposition: A | Discharge status: Home / Self Care | Payer: BC Managed Care – PPO | Attending: General Surgery | Admitting: General Surgery

## 2021-04-28 ENCOUNTER — Encounter (HOSPITAL_BASED_OUTPATIENT_CLINIC_OR_DEPARTMENT_OTHER)
Admission: RE | Disposition: A | Discharge status: Home / Self Care | Payer: Self-pay | Source: Home / Self Care | Attending: General Surgery

## 2021-04-28 ENCOUNTER — Encounter (HOSPITAL_BASED_OUTPATIENT_CLINIC_OR_DEPARTMENT_OTHER): Payer: Self-pay | Admitting: General Surgery

## 2021-04-28 DIAGNOSIS — R928 Other abnormal and inconclusive findings on diagnostic imaging of breast: Secondary | ICD-10-CM

## 2021-04-28 DIAGNOSIS — N6489 Other specified disorders of breast: Secondary | ICD-10-CM | POA: Diagnosis not present

## 2021-04-28 DIAGNOSIS — R921 Mammographic calcification found on diagnostic imaging of breast: Secondary | ICD-10-CM | POA: Diagnosis not present

## 2021-04-28 DIAGNOSIS — Z170 Estrogen receptor positive status [ER+]: Secondary | ICD-10-CM | POA: Insufficient documentation

## 2021-04-28 DIAGNOSIS — Z923 Personal history of irradiation: Secondary | ICD-10-CM | POA: Insufficient documentation

## 2021-04-28 DIAGNOSIS — R739 Hyperglycemia, unspecified: Secondary | ICD-10-CM | POA: Diagnosis not present

## 2021-04-28 DIAGNOSIS — F418 Other specified anxiety disorders: Secondary | ICD-10-CM | POA: Diagnosis not present

## 2021-04-28 DIAGNOSIS — N6021 Fibroadenosis of right breast: Secondary | ICD-10-CM | POA: Insufficient documentation

## 2021-04-28 DIAGNOSIS — Z79811 Long term (current) use of aromatase inhibitors: Secondary | ICD-10-CM | POA: Insufficient documentation

## 2021-04-28 DIAGNOSIS — D0501 Lobular carcinoma in situ of right breast: Secondary | ICD-10-CM | POA: Diagnosis not present

## 2021-04-28 DIAGNOSIS — Z79899 Other long term (current) drug therapy: Secondary | ICD-10-CM | POA: Insufficient documentation

## 2021-04-28 DIAGNOSIS — N62 Hypertrophy of breast: Secondary | ICD-10-CM | POA: Diagnosis not present

## 2021-04-28 DIAGNOSIS — E039 Hypothyroidism, unspecified: Secondary | ICD-10-CM | POA: Diagnosis not present

## 2021-04-28 DIAGNOSIS — Z79890 Hormone replacement therapy (postmenopausal): Secondary | ICD-10-CM | POA: Insufficient documentation

## 2021-04-28 DIAGNOSIS — C50312 Malignant neoplasm of lower-inner quadrant of left female breast: Secondary | ICD-10-CM | POA: Diagnosis not present

## 2021-04-28 DIAGNOSIS — F17200 Nicotine dependence, unspecified, uncomplicated: Secondary | ICD-10-CM | POA: Insufficient documentation

## 2021-04-28 HISTORY — DX: Hypothyroidism, unspecified: E03.9

## 2021-04-28 HISTORY — DX: Anxiety disorder, unspecified: F41.9

## 2021-04-28 HISTORY — PX: RADIOACTIVE SEED GUIDED EXCISIONAL BREAST BIOPSY: SHX6490

## 2021-04-28 HISTORY — DX: Depression, unspecified: F32.A

## 2021-04-28 IMAGING — MG MM BREAST SURGICAL SPECIMEN
1 series · 2 of 2 positions shown · non-contrast
Comparison: Previous exam(s).

CLINICAL DATA: Surgical excision of bracketed calcifications in the
upper-outer right breast containing cylinder shaped and barbell
shaped biopsy marker clips and 2 radioactive seeds at the location
of previously biopsied lobular neoplasia and complex sclerosing
lesion. There is a separate surgical excision of an area of ADH
marked with a cylinder shaped biopsy marker in the lower inner right
breast.

EXAM:
SPECIMEN RADIOGRAPH OF THE RIGHT BREAST X 2

[Series 1: R · right · 0.07mm/px · 2 of 2 slices shown]
[im 1/2]
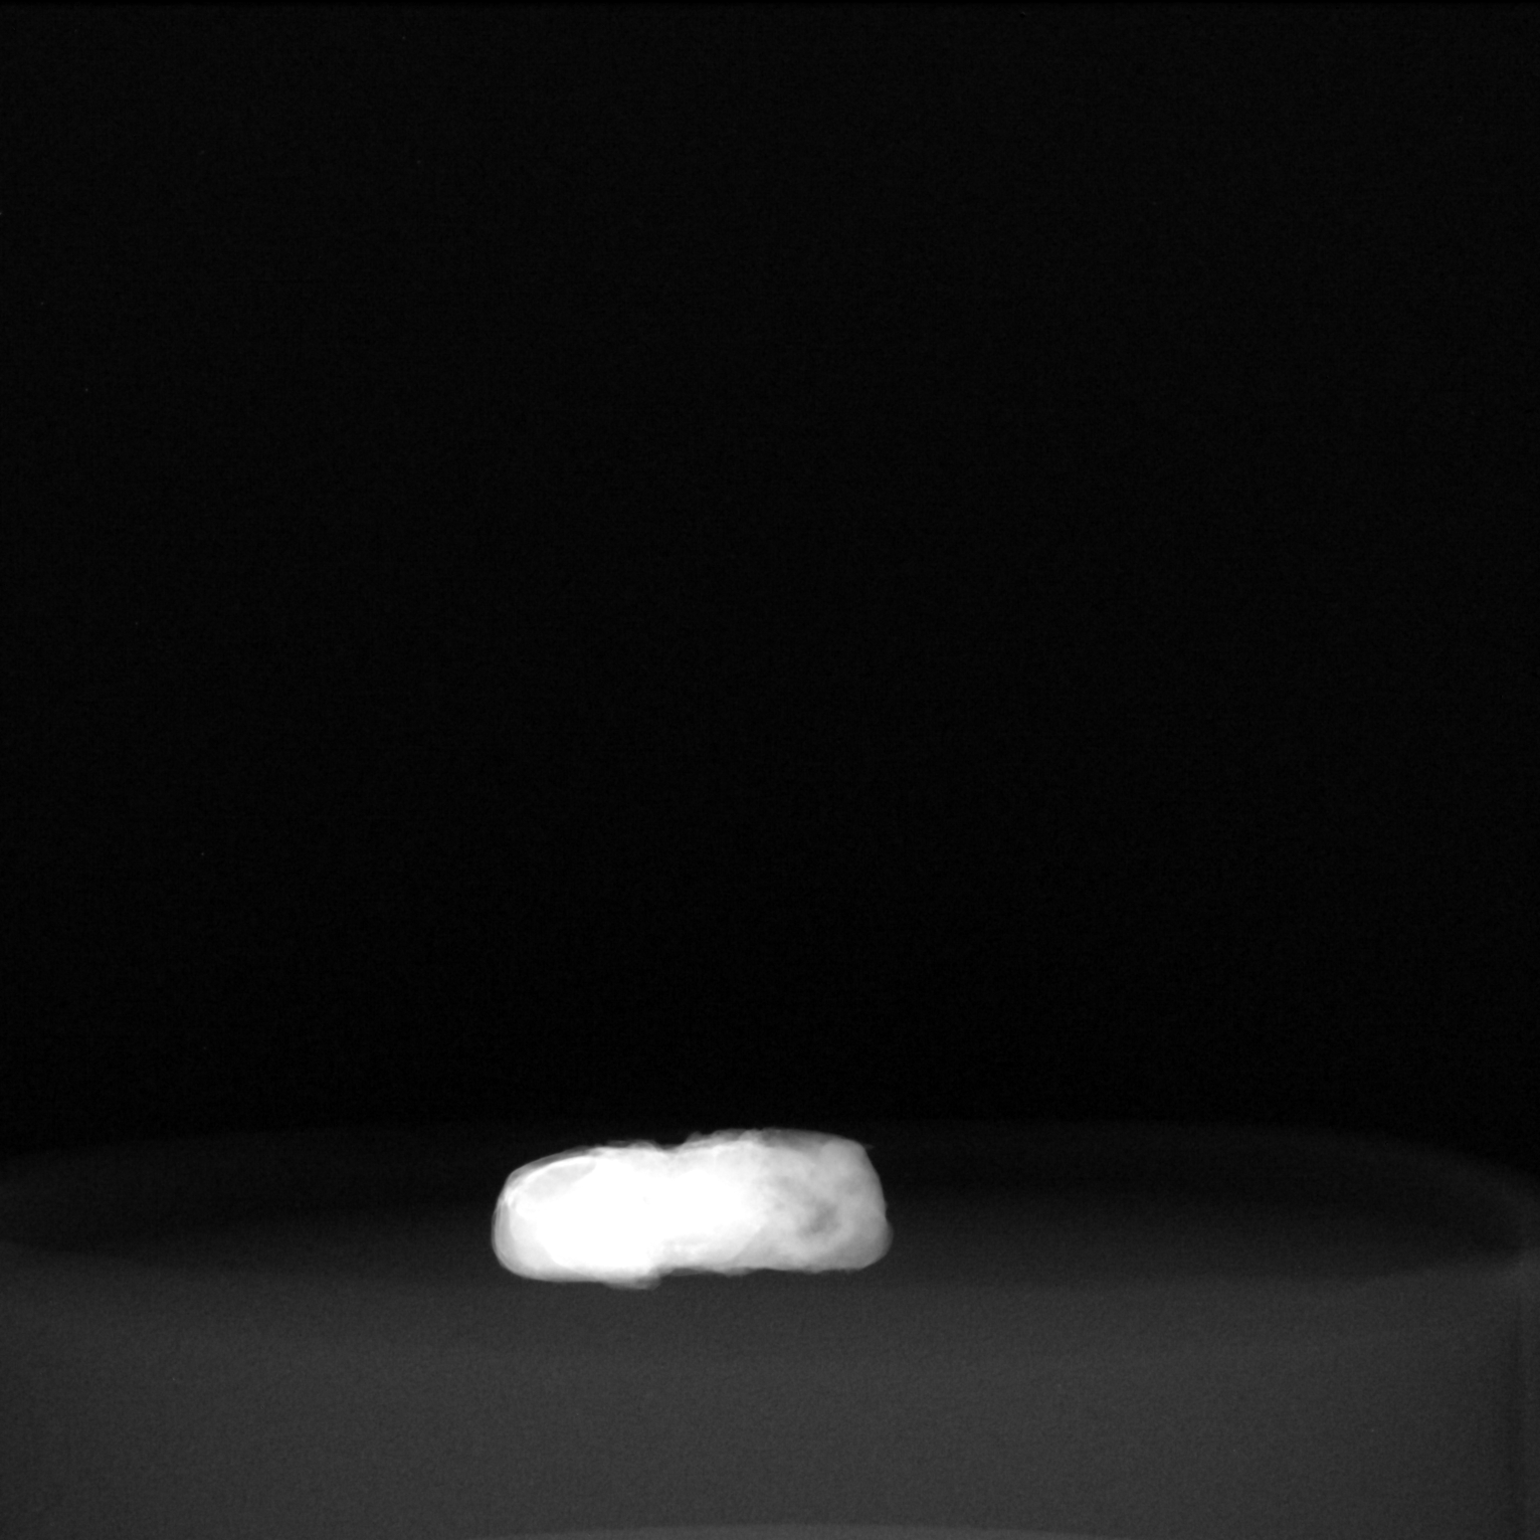
[im 2/2]
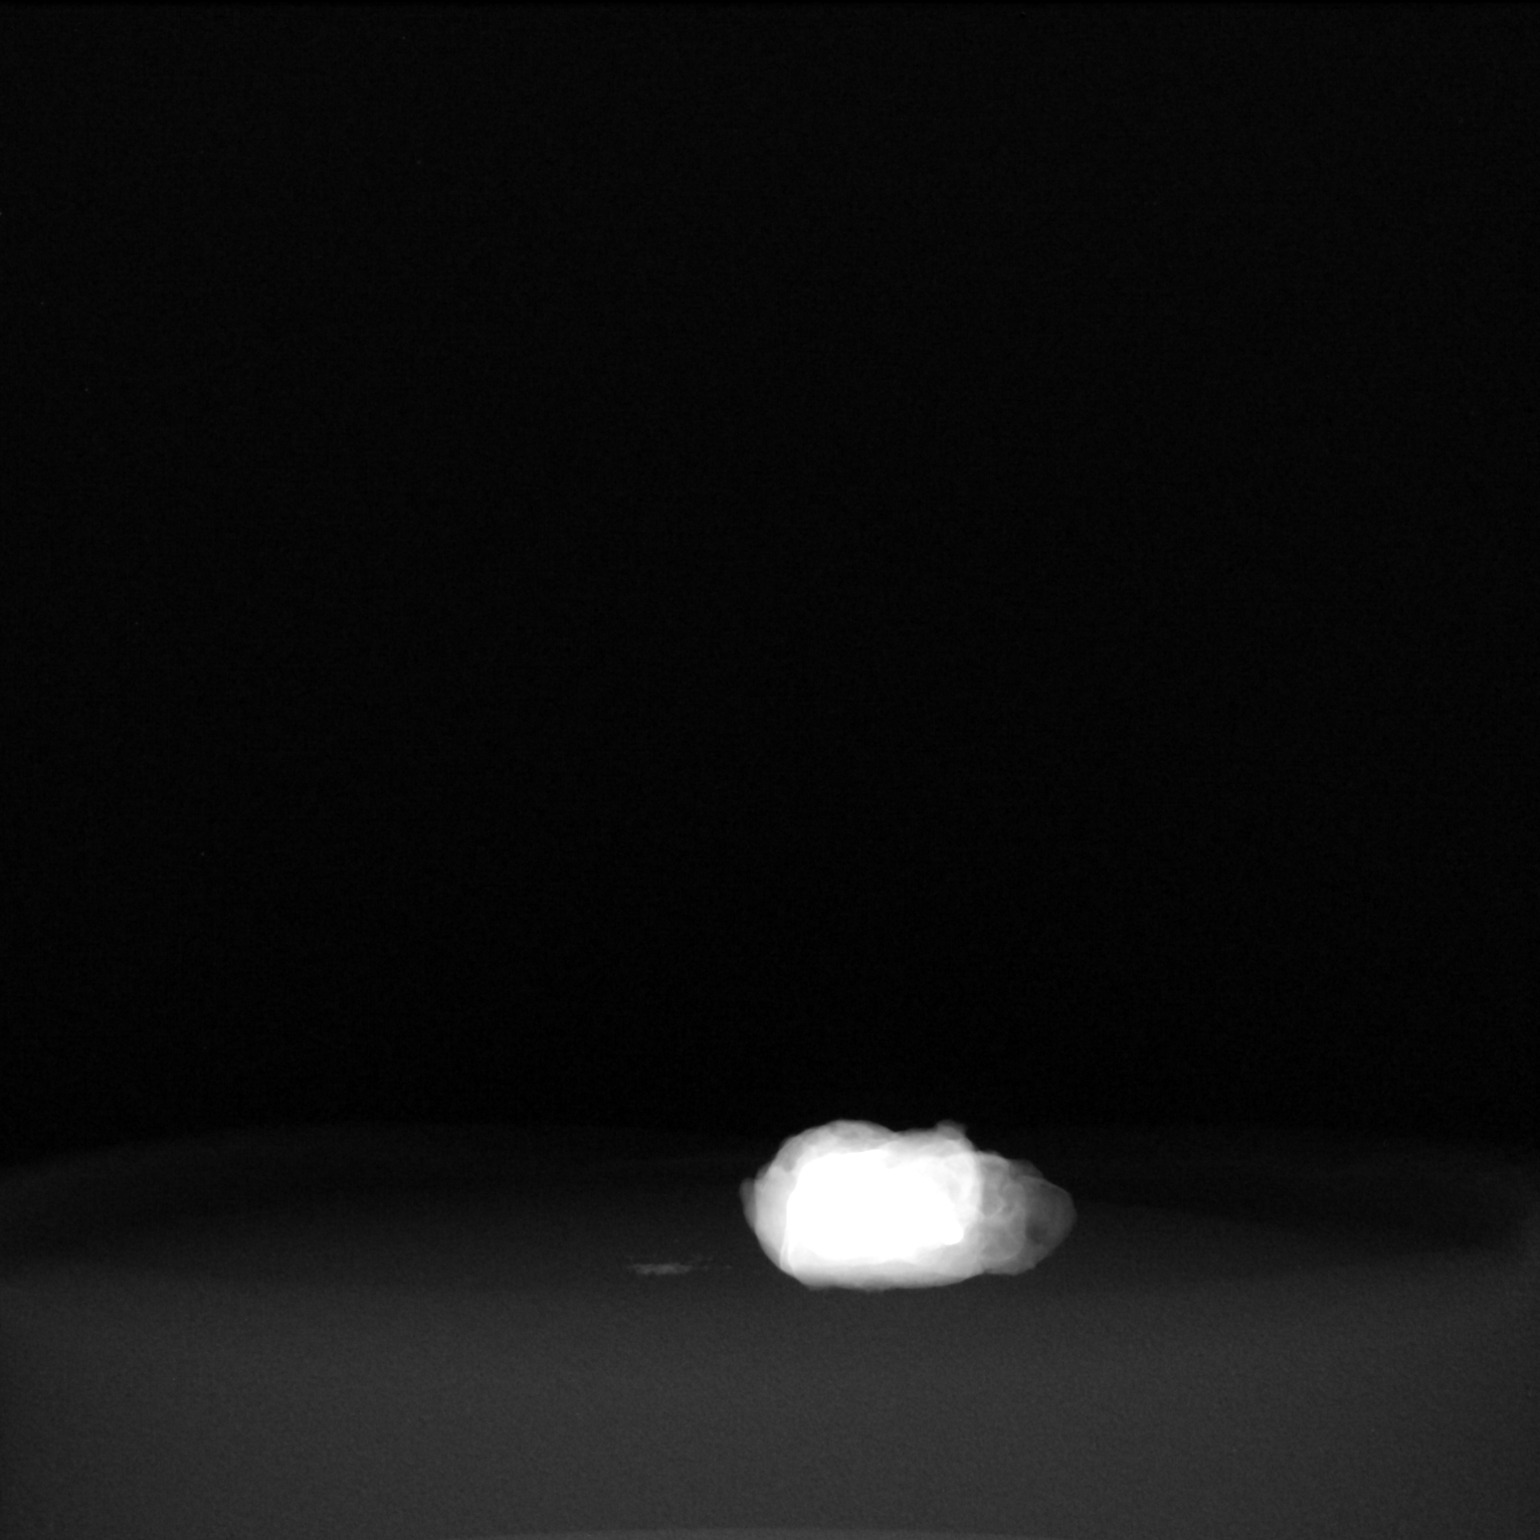

[2 of 2 positions shown; findings below may reference images not displayed]

FINDINGS: Status post excision of the right breast. One specimen contains 2
radioactive seeds, a barbell shaped biopsy marker clip and a
cylinder-shaped biopsy marker clip. This also contains multiple
calcifications.

A 2nd specimen contains a cylinder-shaped biopsy marker clip and
radioactive seed. The seeds and clips are intact.
IMPRESSION: Specimen radiograph of the right breast x 2.

## 2021-04-28 IMAGING — MG MM BREAST SURGICAL SPECIMEN
1 series · 2 of 2 positions shown · non-contrast
Comparison: Previous exam(s).

CLINICAL DATA: Surgical excision of bracketed calcifications in the
upper-outer right breast containing cylinder shaped and barbell
shaped biopsy marker clips and 2 radioactive seeds at the location
of previously biopsied lobular neoplasia and complex sclerosing
lesion. There is a separate surgical excision of an area of ADH
marked with a cylinder shaped biopsy marker in the lower inner right
breast.

EXAM:
SPECIMEN RADIOGRAPH OF THE RIGHT BREAST X 2

[Series 1: R · right · 0.07mm/px · 2 of 2 slices shown]
[im 1/2]
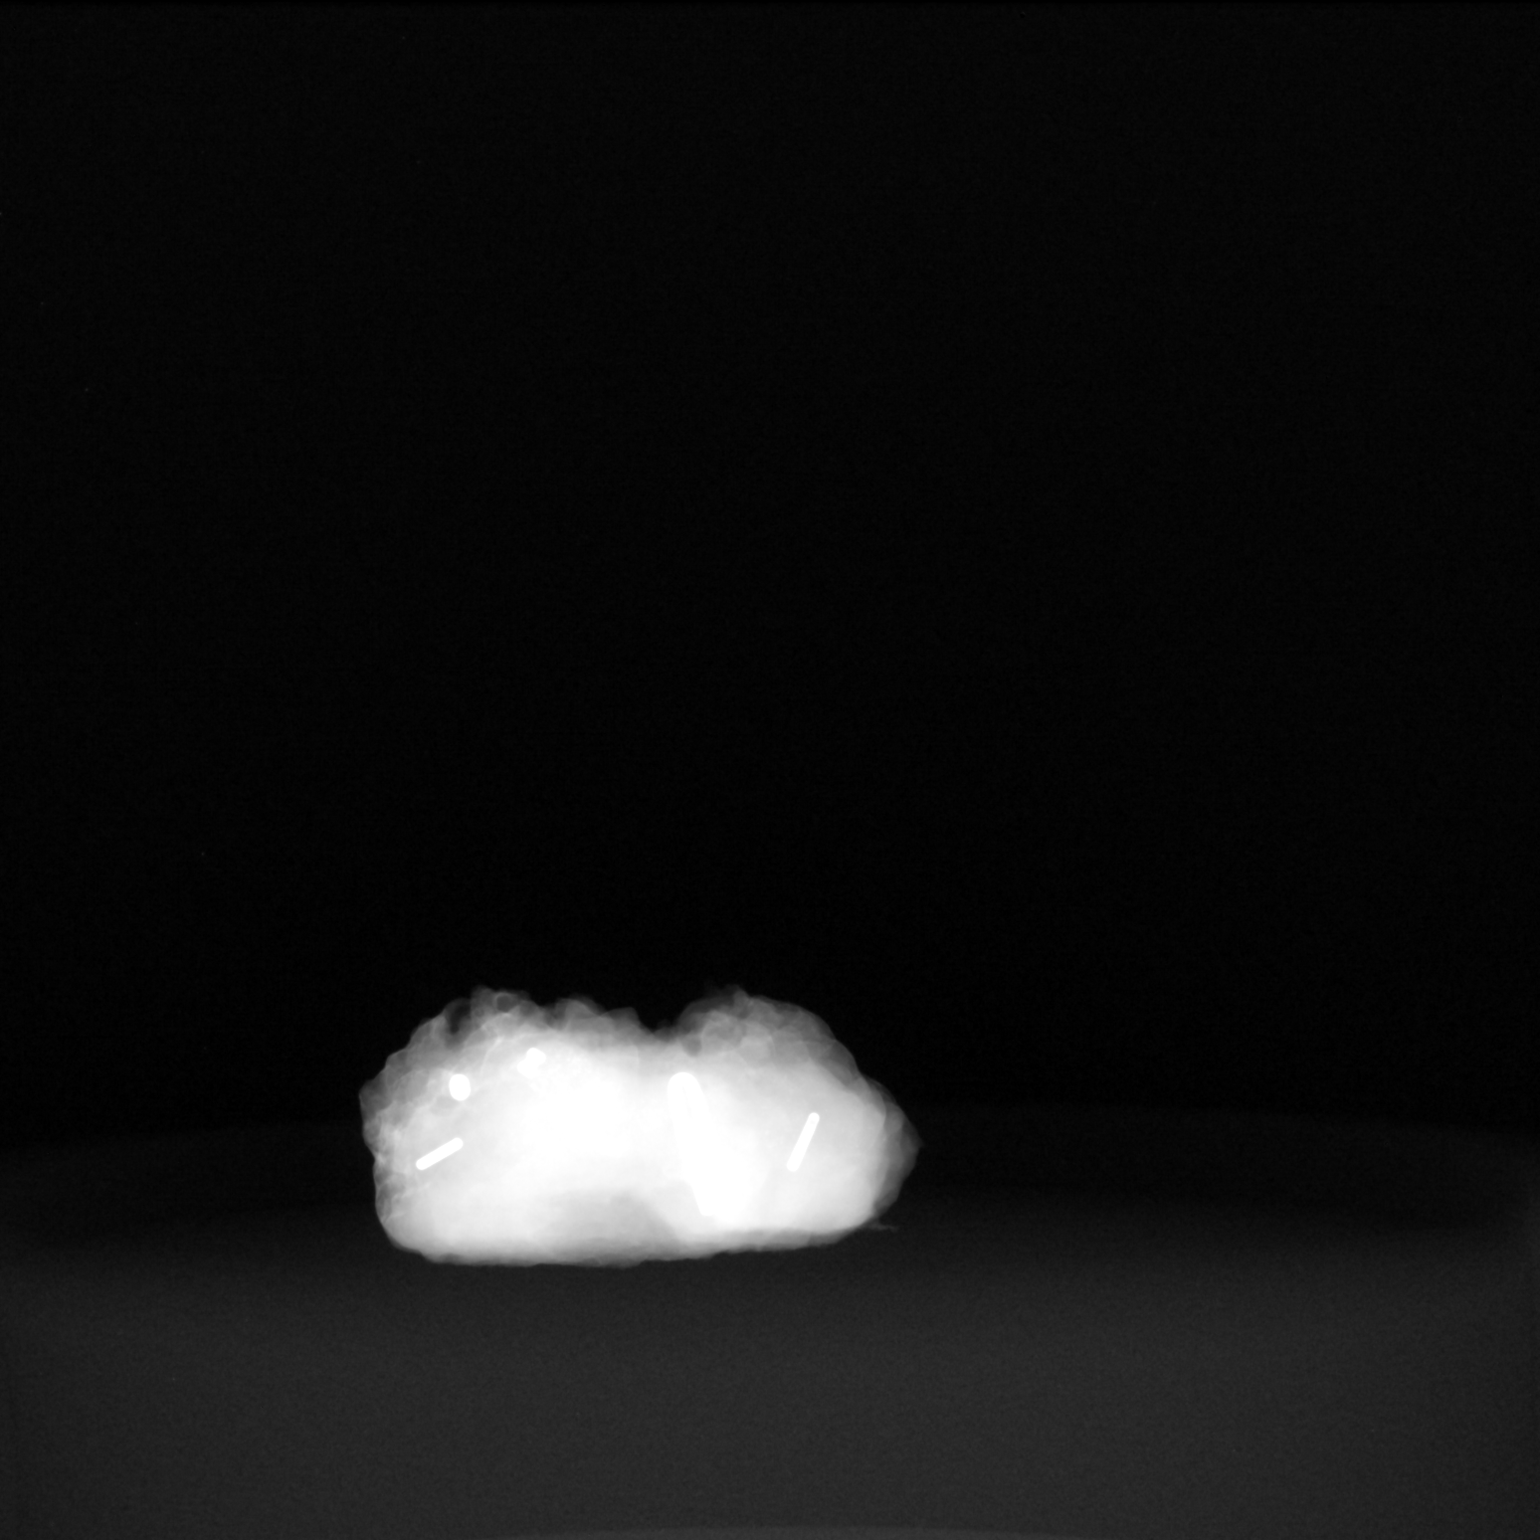
[im 2/2]
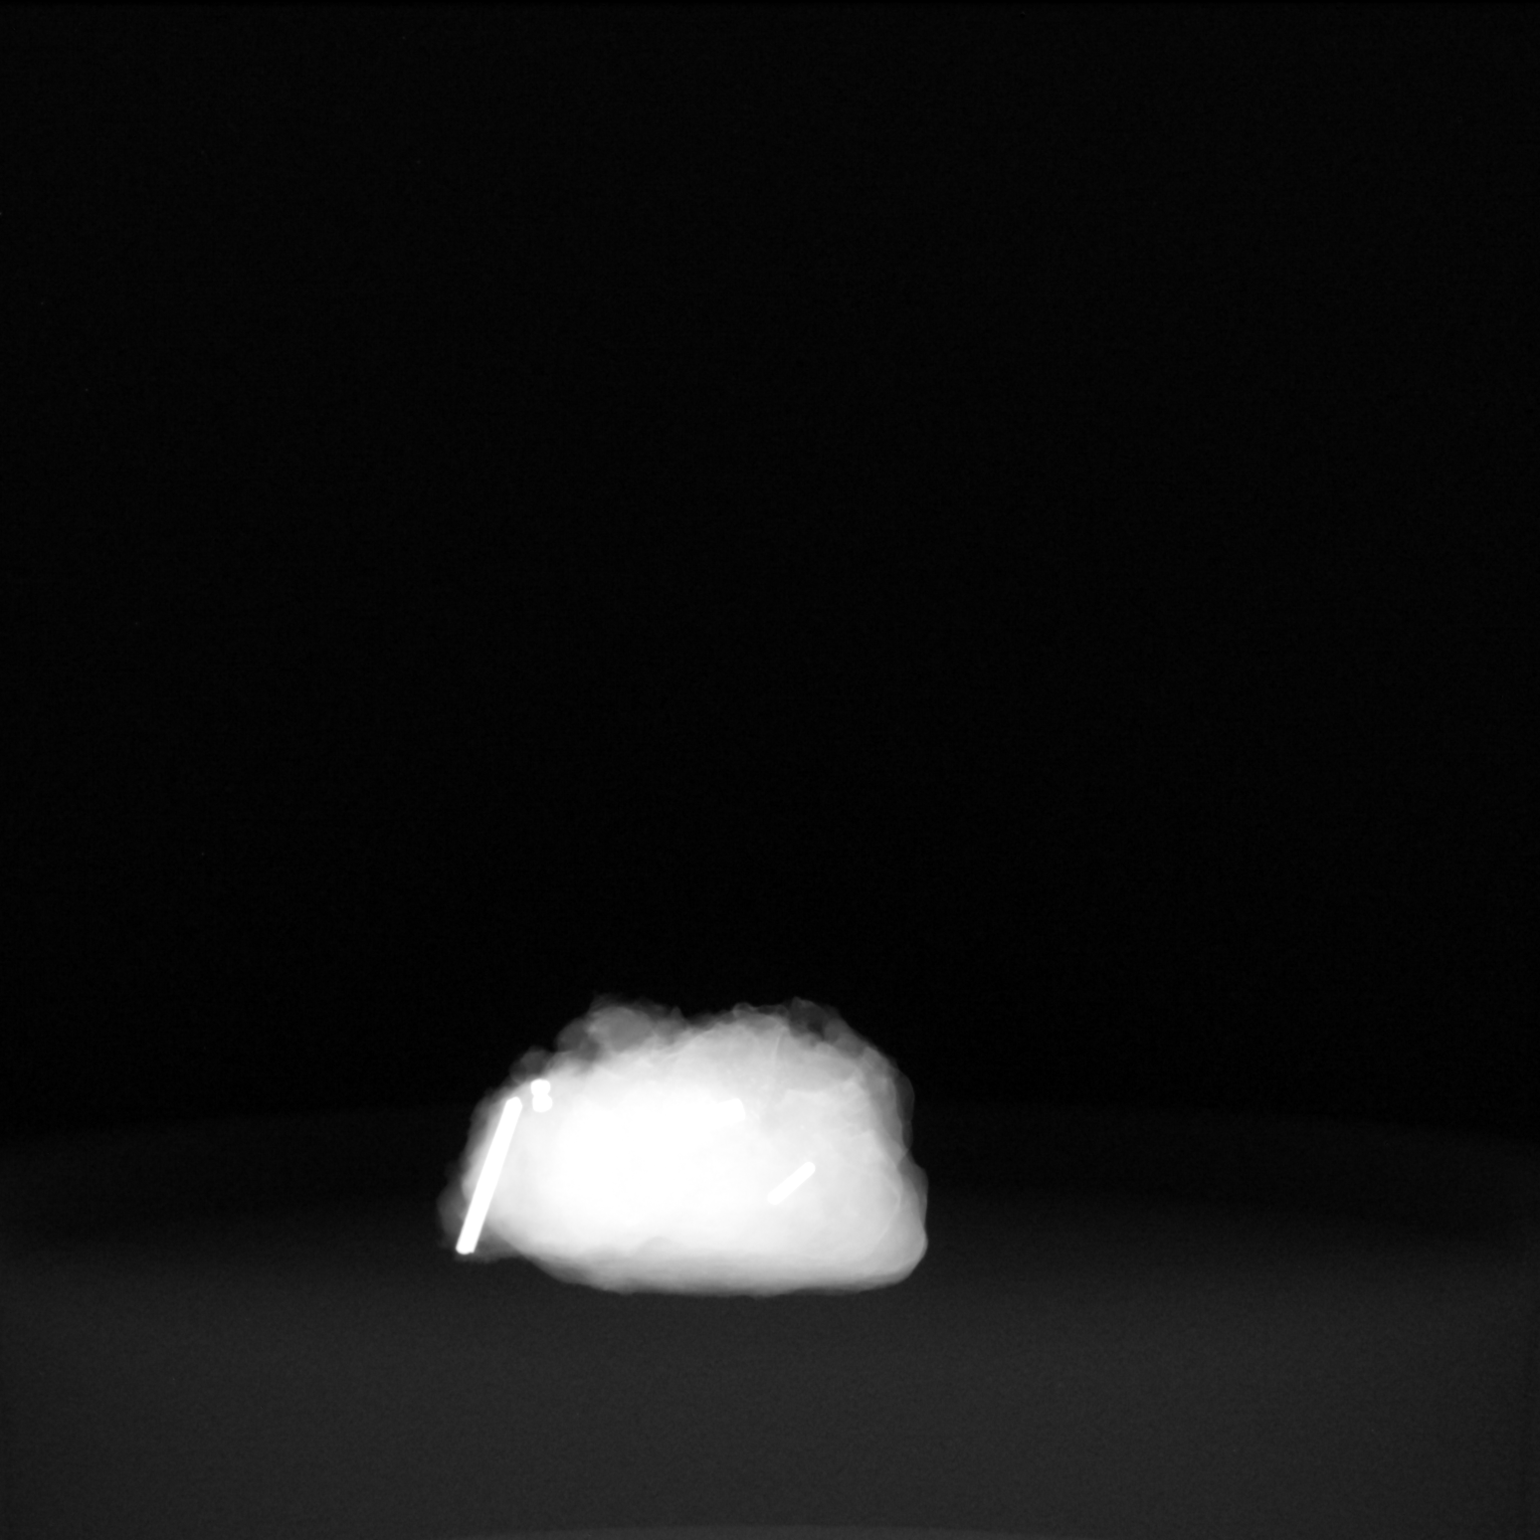

[2 of 2 positions shown; findings below may reference images not displayed]

FINDINGS: Status post excision of the right breast. One specimen contains 2
radioactive seeds, a barbell shaped biopsy marker clip and a
cylinder-shaped biopsy marker clip. This also contains multiple
calcifications.

A 2nd specimen contains a cylinder-shaped biopsy marker clip and
radioactive seed. The seeds and clips are intact.
IMPRESSION: Specimen radiograph of the right breast x 2.

## 2021-04-28 SURGERY — RADIOACTIVE SEED GUIDED BREAST BIOPSY
Anesthesia: General | Site: Breast | Laterality: Right

## 2021-04-28 MED ORDER — PROMETHAZINE HCL 25 MG/ML IJ SOLN: 6.2500 mg | INTRAMUSCULAR | Status: DC | PRN

## 2021-04-28 MED ORDER — PROPOFOL 10 MG/ML IV BOLUS
INTRAVENOUS | Status: AC
  Filled 2021-04-28: qty 40

## 2021-04-28 MED ORDER — CEFAZOLIN SODIUM-DEXTROSE 2-3 GM-%(50ML) IV SOLR
INTRAVENOUS | Status: DC | PRN
  Administered 2021-04-28: 2 g via INTRAVENOUS

## 2021-04-28 MED ORDER — LACTATED RINGERS IV SOLN: INTRAVENOUS | Status: DC

## 2021-04-28 MED ORDER — ACETAMINOPHEN 500 MG PO TABS
ORAL_TABLET | ORAL | Status: AC
  Filled 2021-04-28: qty 2

## 2021-04-28 MED ORDER — MIDAZOLAM HCL 2 MG/2ML IJ SOLN
INTRAMUSCULAR | Status: AC
  Filled 2021-04-28: qty 2

## 2021-04-28 MED ORDER — ACETAMINOPHEN 500 MG PO TABS
1000.0000 mg | ORAL_TABLET | ORAL | Status: AC
  Administered 2021-04-28: 1000 mg via ORAL

## 2021-04-28 MED ORDER — DEXAMETHASONE SODIUM PHOSPHATE 10 MG/ML IJ SOLN
INTRAMUSCULAR | Status: DC | PRN
  Administered 2021-04-28: 5 mg via INTRAVENOUS

## 2021-04-28 MED ORDER — LIDOCAINE HCL (PF) 2 % IJ SOLN
INTRAMUSCULAR | Status: AC
  Filled 2021-04-28: qty 5

## 2021-04-28 MED ORDER — LACTATED RINGERS IV SOLN: INTRAVENOUS | Status: DC | PRN

## 2021-04-28 MED ORDER — ACETAMINOPHEN 500 MG PO TABS: 1000.0000 mg | ORAL_TABLET | Freq: Once | ORAL | Status: AC

## 2021-04-28 MED ORDER — ENSURE PRE-SURGERY PO LIQD: 296.0000 mL | Freq: Once | ORAL | Status: DC

## 2021-04-28 MED ORDER — OXYCODONE HCL 5 MG/5ML PO SOLN: 5.0000 mg | Freq: Once | ORAL | Status: DC | PRN

## 2021-04-28 MED ORDER — DEXAMETHASONE SODIUM PHOSPHATE 10 MG/ML IJ SOLN: INTRAMUSCULAR | Status: DC | PRN

## 2021-04-28 MED ORDER — DEXAMETHASONE SODIUM PHOSPHATE 10 MG/ML IJ SOLN
INTRAMUSCULAR | Status: AC
  Filled 2021-04-28: qty 1

## 2021-04-28 MED ORDER — PROPOFOL 10 MG/ML IV BOLUS
INTRAVENOUS | Status: DC | PRN
  Administered 2021-04-28: 100 mg via INTRAVENOUS

## 2021-04-28 MED ORDER — FENTANYL CITRATE (PF) 100 MCG/2ML IJ SOLN
INTRAMUSCULAR | Status: AC
  Filled 2021-04-28: qty 2

## 2021-04-28 MED ORDER — MIDAZOLAM HCL 2 MG/2ML IJ SOLN
INTRAMUSCULAR | Status: DC | PRN
  Administered 2021-04-28: 2 mg via INTRAVENOUS

## 2021-04-28 MED ORDER — CHLORHEXIDINE GLUCONATE CLOTH 2 % EX PADS: 6.0000 | MEDICATED_PAD | Freq: Once | CUTANEOUS | Status: DC

## 2021-04-28 MED ORDER — FENTANYL CITRATE (PF) 100 MCG/2ML IJ SOLN: 25.0000 ug | INTRAMUSCULAR | Status: DC | PRN

## 2021-04-28 MED ORDER — EPHEDRINE SULFATE 50 MG/ML IJ SOLN
INTRAMUSCULAR | Status: DC | PRN
  Administered 2021-04-28: 10 mg via INTRAVENOUS
  Administered 2021-04-28 (×2): 15 mg via INTRAVENOUS
  Administered 2021-04-28: 5 mg via INTRAVENOUS

## 2021-04-28 MED ORDER — FENTANYL CITRATE (PF) 100 MCG/2ML IJ SOLN
INTRAMUSCULAR | Status: DC | PRN
  Administered 2021-04-28 (×2): 50 ug via INTRAVENOUS

## 2021-04-28 MED ORDER — LIDOCAINE HCL 1 % IJ SOLN
INTRAMUSCULAR | Status: DC | PRN
  Administered 2021-04-28: 30 mL via INTRAMUSCULAR

## 2021-04-28 MED ORDER — OXYCODONE HCL 5 MG PO TABS: 5.0000 mg | ORAL_TABLET | Freq: Once | ORAL | Status: DC | PRN

## 2021-04-28 MED ORDER — CEFAZOLIN SODIUM-DEXTROSE 2-4 GM/100ML-% IV SOLN
INTRAVENOUS | Status: AC
  Filled 2021-04-28: qty 100

## 2021-04-28 MED ORDER — PROPOFOL 500 MG/50ML IV EMUL
INTRAVENOUS | Status: DC | PRN
  Administered 2021-04-28: 35 ug/kg/min via INTRAVENOUS

## 2021-04-28 MED ORDER — ONDANSETRON HCL 4 MG/2ML IJ SOLN
INTRAMUSCULAR | Status: DC | PRN
  Administered 2021-04-28: 4 mg via INTRAVENOUS

## 2021-04-28 MED ORDER — CEFAZOLIN SODIUM-DEXTROSE 2-4 GM/100ML-% IV SOLN: 2.0000 g | INTRAVENOUS | Status: DC

## 2021-04-28 MED ORDER — LIDOCAINE HCL (CARDIAC) PF 100 MG/5ML IV SOSY
PREFILLED_SYRINGE | INTRAVENOUS | Status: DC | PRN
  Administered 2021-04-28: 60 mg via INTRAVENOUS

## 2021-04-28 MED ORDER — ONDANSETRON HCL 4 MG/2ML IJ SOLN
INTRAMUSCULAR | Status: AC
  Filled 2021-04-28: qty 2

## 2021-04-28 MED ORDER — LIDOCAINE HCL (PF) 1 % IJ SOLN
INTRAMUSCULAR | Status: AC
  Filled 2021-04-28: qty 30

## 2021-04-28 SURGICAL SUPPLY — 48 items
ADH SKN CLS APL DERMABOND .7 (GAUZE/BANDAGES/DRESSINGS) ×1
APL PRP STRL LF DISP 70% ISPRP (MISCELLANEOUS) ×1
BLADE SURG 10 STRL SS (BLADE) ×2 IMPLANT
BLADE SURG 15 STRL LF DISP TIS (BLADE) ×1 IMPLANT
BLADE SURG 15 STRL SS (BLADE) ×2
CANISTER SUC SOCK COL 7IN (MISCELLANEOUS) IMPLANT
CANISTER SUCT 1200ML W/VALVE (MISCELLANEOUS) ×2 IMPLANT
CHLORAPREP W/TINT 26 (MISCELLANEOUS) ×2 IMPLANT
CLIP TI LARGE 6 (CLIP) IMPLANT
COVER BACK TABLE 60X90IN (DRAPES) ×2 IMPLANT
COVER MAYO STAND STRL (DRAPES) ×2 IMPLANT
COVER PROBE W GEL 5X96 (DRAPES) ×2 IMPLANT
DERMABOND ADVANCED (GAUZE/BANDAGES/DRESSINGS) ×1
DERMABOND ADVANCED .7 DNX12 (GAUZE/BANDAGES/DRESSINGS) ×1 IMPLANT
DRAPE LAPAROSCOPIC ABDOMINAL (DRAPES) ×2 IMPLANT
DRAPE UTILITY XL STRL (DRAPES) ×2 IMPLANT
ELECT COATED BLADE 2.86 ST (ELECTRODE) ×2 IMPLANT
ELECT REM PT RETURN 9FT ADLT (ELECTROSURGICAL) ×2
ELECTRODE REM PT RTRN 9FT ADLT (ELECTROSURGICAL) ×1 IMPLANT
GAUZE SPONGE 4X4 12PLY STRL LF (GAUZE/BANDAGES/DRESSINGS) ×2 IMPLANT
GLOVE SURG ENC MOIS LTX SZ6 (GLOVE) ×2 IMPLANT
GLOVE SURG POLYISO LF SZ7 (GLOVE) ×2 IMPLANT
GLOVE SURG UNDER POLY LF SZ6.5 (GLOVE) ×2 IMPLANT
GLOVE SURG UNDER POLY LF SZ7 (GLOVE) ×4 IMPLANT
GOWN STRL REUS W/ TWL LRG LVL3 (GOWN DISPOSABLE) ×1 IMPLANT
GOWN STRL REUS W/TWL 2XL LVL3 (GOWN DISPOSABLE) ×2 IMPLANT
GOWN STRL REUS W/TWL LRG LVL3 (GOWN DISPOSABLE) ×2
KIT MARKER MARGIN INK (KITS) ×2 IMPLANT
LIGHT WAVEGUIDE WIDE FLAT (MISCELLANEOUS) IMPLANT
NEEDLE HYPO 25X1 1.5 SAFETY (NEEDLE) ×2 IMPLANT
NS IRRIG 1000ML POUR BTL (IV SOLUTION) ×2 IMPLANT
PACK BASIN DAY SURGERY FS (CUSTOM PROCEDURE TRAY) ×2 IMPLANT
PENCIL SMOKE EVACUATOR (MISCELLANEOUS) ×2 IMPLANT
SLEEVE SCD COMPRESS KNEE MED (STOCKING) ×2 IMPLANT
SPONGE T-LAP 18X18 ~~LOC~~+RFID (SPONGE) ×2 IMPLANT
STRIP CLOSURE SKIN 1/2X4 (GAUZE/BANDAGES/DRESSINGS) ×2 IMPLANT
SUT MON AB 4-0 PC3 18 (SUTURE) ×2 IMPLANT
SUT SILK 2 0 SH (SUTURE) IMPLANT
SUT VIC AB 2-0 SH 18 (SUTURE) IMPLANT
SUT VIC AB 3-0 SH 27 (SUTURE) ×2
SUT VIC AB 3-0 SH 27X BRD (SUTURE) ×1 IMPLANT
SUT VICRYL 3-0 CR8 SH (SUTURE) IMPLANT
SYR BULB EAR ULCER 3OZ GRN STR (SYRINGE) ×2 IMPLANT
SYR CONTROL 10ML LL (SYRINGE) ×2 IMPLANT
TOWEL GREEN STERILE FF (TOWEL DISPOSABLE) ×2 IMPLANT
TRAY FAXITRON CT DISP (TRAY / TRAY PROCEDURE) ×2 IMPLANT
TUBE CONNECTING 20X1/4 (TUBING) ×2 IMPLANT
YANKAUER SUCT BULB TIP NO VENT (SUCTIONS) ×2 IMPLANT

## 2021-04-28 NOTE — Op Note (Signed)
Right Breast Radioactive seed bracketed excisional biopsy and right breast seed localized excisional biopsy  Indications: This patient presents with history of abnormal right mammogram with discordant core needle biopsies  (ADH and LCIS)  Pre-operative Diagnosis: abnormal right mammogram    Post-operative Diagnosis: abnormal right mammogram  Surgeon: [name]   Anesthesia: General endotracheal anesthesia  ASA Class: 2  Procedure Details  The patient was seen in the Holding Room. The risks, benefits, complications, treatment options, and expected outcomes were discussed with the patient. The possibilities of bleeding, infection, the need for additional procedures, failure to diagnose a condition, and creating a complication requiring transfusion or operation were discussed with the patient. The patient concurred with the proposed plan, giving informed consent.  The site of surgery properly noted/marked. The patient was taken to Operating Room # 8, identified, and the procedure verified as right Breast seed bracketed excisional biopsy. A Time Out was held and the above information confirmed.  The right breast and chest were prepped and draped in standard fashion. The previous lateral circumareolar incision was opened.   Dissection was carried down around the point of maximum signal intensities in the UOQ. The cautery was used to perform the dissection.   The specimen was inked with the margin marker paint kit.    Specimen radiography confirmed inclusion of the mammographic clips and seeds along with one surgical clip.  The LIQ seed was still very close to the areola, so the same incision was used.  A small amount of tissue was taken at this site. Specimen mammogram of the LIQ area also showed the clip and the seed. The background signal in the breast was zero. Marland Kitchen  Hemostasis was achieved with cautery.  No additional surgical clips were placed given proximity to the previous surgical clips. The  wound was irrigated and closed with 3-0 vicryl interrupted deep dermal sutures and 4-0 monocryl running subcuticular suture.      Sterile dressings were applied. At the end of the operation, all sponge, instrument, and needle counts were correct.  Findings: Seed, clip in specimen.  anterior margin is skin, posterior margin is pectoralis  Estimated Blood Loss:  min         Specimens: right breast tissue with seeds UOQ, right breast tissue with seed LIQ.          Complications:  None; patient tolerated the procedure well.         Disposition: PACU - hemodynamically stable.         Condition: stable

## 2021-04-28 NOTE — Interval H&P Note (Signed)
History and Physical Interval Note:  04/28/2021 7:35 AM  Martha [name]  has presented today for surgery, with the diagnosis of ABNORMAL RIGHT MAMMOGRAM.  The various methods of treatment have been discussed with the patient and family. After consideration of risks, benefits and other options for treatment, the patient has consented to  Procedure(s): RADIOACTIVE SEED GUIDED EXCISIONAL RIGHT BREAST BIOPSY X2 (Right) as a surgical intervention.  The patient's history has been reviewed, patient examined, no change in status, stable for surgery.  I have reviewed the patient's chart and labs.  Questions were answered to the patient's satisfaction.     [name]

## 2021-04-28 NOTE — Anesthesia Procedure Notes (Signed)
Procedure Name: LMA Insertion Date/Time: 04/28/2021 7:54 AM Performed by: Verita Lamb, CRNA Pre-anesthesia Checklist: Patient identified, Emergency Drugs available, Suction available and Patient being monitored Patient Re-evaluated:Patient Re-evaluated prior to induction Oxygen Delivery Method: Circle system utilized Preoxygenation: Pre-oxygenation with 100% oxygen Induction Type: IV induction Ventilation: Mask ventilation without difficulty LMA: LMA inserted LMA Size: 4.0 Number of attempts: 1 Airway Equipment and Method: Bite block Placement Confirmation: positive ETCO2, CO2 detector and breath sounds checked- equal and bilateral Tube secured with: Tape Dental Injury: Teeth and Oropharynx as per pre-operative assessment

## 2021-04-28 NOTE — Discharge Instructions (Addendum)
[place] Office Phone Number [phone]  BREAST BIOPSY/ PARTIAL MASTECTOMY: POST OP INSTRUCTIONS  Always review your discharge instruction sheet given to you by the facility where your surgery was performed.  IF YOU HAVE DISABILITY OR FAMILY LEAVE FORMS, YOU MUST BRING THEM TO THE OFFICE FOR PROCESSING.  DO NOT GIVE THEM TO YOUR DOCTOR.  A prescription for pain medication may be given to you upon discharge.  Take your pain medication as prescribed, if needed.  If narcotic pain medicine is not needed, then you may take acetaminophen (Tylenol) or ibuprofen (Advil) as needed. Take your usually prescribed medications unless otherwise directed If you need a refill on your pain medication, please contact your pharmacy.  They will contact our office to request authorization.  Prescriptions will not be filled after 5pm or on week-ends. You should eat very light the first 24 hours after surgery, such as soup, crackers, pudding, etc.  Resume your normal diet the day after surgery. Most patients will experience some swelling and bruising in the breast.  Ice packs and a good support bra will help.  Swelling and bruising can take several days to resolve.  It is common to experience some constipation if taking pain medication after surgery.  Increasing fluid intake and taking a stool softener will usually help or prevent this problem from occurring.  A mild laxative (Milk of Magnesia or Miralax) should be taken according to package directions if there are no bowel movements after 48 hours. Unless discharge instructions indicate otherwise, you may remove your bandages 48 hours after surgery, and you may shower at that time.  You may have steri-strips (small skin tapes) in place directly over the incision.  These strips should be left on the skin for 7-10 days.   Any sutures or staples will be removed at the office during your follow-up visit. ACTIVITIES:  You may resume regular daily activities  (gradually increasing) beginning the next day.  Wearing a good support bra or sports bra (or the breast binder) minimizes pain and swelling.  You may have sexual intercourse when it is comfortable. You may drive when you no longer are taking prescription pain medication, you can comfortably wear a seatbelt, and you can safely maneuver your car and apply brakes. RETURN TO WORK:  __________1 week_______________ Dennis Bast should see your doctor in the office for a follow-up appointment approximately two weeks after your surgery.  Your doctor's nurse will typically make your follow-up appointment when she calls you with your pathology report.  Expect your pathology report 2-3 business days after your surgery.  You may call to check if you do not hear from [place] after three days.   WHEN TO CALL YOUR DOCTOR: Fever over 101.0 Nausea and/or vomiting. Extreme swelling or bruising. Continued bleeding from incision. Increased pain, redness, or drainage from the incision.  The clinic staff is available to answer your questions during regular business hours.  Please don't hesitate to call and ask to speak to one of the nurses for clinical concerns.  If you have a medical emergency, go to the nearest emergency room or call 911.  A surgeon from [facility] is always on call at the hospital.  For further questions, please visit [email or web address]   Tylenol given this morning--you may take it again today after 1pm if needed.  Post Anesthesia Home Care Instructions  Activity: Get plenty of rest for the remainder of the day. A responsible individual must stay with you for 24 hours following the  procedure.  For the next 24 hours, DO NOT: -Drive a car -Paediatric nurse -Drink alcoholic beverages -Take any medication unless instructed by your physician -Make any legal decisions or sign important papers.  Meals: Start with liquid foods such as gelatin or soup. Progress to regular foods as  tolerated. Avoid greasy, spicy, heavy foods. If nausea and/or vomiting occur, drink only clear liquids until the nausea and/or vomiting subsides. Call your physician if vomiting continues.  Special Instructions/Symptoms: Your throat may feel dry or sore from the anesthesia or the breathing tube placed in your throat during surgery. If this causes discomfort, gargle with warm salt water. The discomfort should disappear within 24 hours.  If you had a scopolamine patch placed behind your ear for the management of post- operative nausea and/or vomiting:  1. The medication in the patch is effective for 72 hours, after which it should be removed.  Wrap patch in a tissue and discard in the trash. Wash hands thoroughly with soap and water. 2. You may remove the patch earlier than 72 hours if you experience unpleasant side effects which may include dry mouth, dizziness or visual disturbances. 3. Avoid touching the patch. Wash your hands with soap and water after contact with the patch.

## 2021-04-28 NOTE — Transfer of Care (Signed)
Immediate Anesthesia Transfer of Care Note  Patient: Martha [name]  Procedure(s) Performed: RADIOACTIVE SEED GUIDED EXCISIONAL RIGHT BREAST BIOPSY X2 (Right: Breast)  Patient Location: PACU  Anesthesia Type:General  Level of Consciousness: awake, alert  and oriented  Airway & Oxygen Therapy: Patient Spontanous Breathing and Patient connected to face mask oxygen  Post-op Assessment: Report given to RN and Post -op Vital signs reviewed and stable  Post vital signs: Reviewed and stable  Last Vitals:  Vitals Value Taken Time  BP    Temp    Pulse    Resp    SpO2      Last Pain:  Vitals:   04/28/21 0642  TempSrc: Oral  PainSc: 0-No pain      Patients Stated Pain Goal: 2 ([id number])  Complications: No notable events documented.

## 2021-04-28 NOTE — Anesthesia Postprocedure Evaluation (Signed)
Anesthesia Post Note  Patient: Martha [name]  Procedure(s) Performed: RADIOACTIVE SEED GUIDED EXCISIONAL RIGHT BREAST BIOPSY X2 (Right: Breast)     Patient location during evaluation: PACU Anesthesia Type: General Level of consciousness: awake and alert and oriented Pain management: pain level controlled Vital Signs Assessment: post-procedure vital signs reviewed and stable Respiratory status: spontaneous breathing, nonlabored ventilation and respiratory function stable Cardiovascular status: blood pressure returned to baseline Postop Assessment: no apparent nausea or vomiting Anesthetic complications: no   No notable events documented.  Last Vitals:  Vitals:   04/28/21 0845 04/28/21 0900  BP: (!) 111/55 (!) 108/51  Pulse: 78 74  Resp:  17  Temp: 36.6 C   SpO2: 95% 95%    Last Pain:  Vitals:   04/28/21 0900  TempSrc:   PainSc: 0-No pain                 Marthenia Rolling

## 2021-05-03 ENCOUNTER — Encounter (HOSPITAL_BASED_OUTPATIENT_CLINIC_OR_DEPARTMENT_OTHER): Payer: Self-pay | Admitting: General Surgery

## 2021-05-06 LAB — SURGICAL PATHOLOGY

## 2021-05-19 ENCOUNTER — Encounter: Payer: Self-pay | Admitting: Nurse Practitioner

## 2021-05-22 ENCOUNTER — Encounter: Payer: Self-pay | Admitting: Nurse Practitioner

## 2021-05-23 ENCOUNTER — Ambulatory Visit: Payer: Commercial Managed Care - PPO | Admitting: Nurse Practitioner

## 2021-05-23 ENCOUNTER — Other Ambulatory Visit: Payer: Commercial Managed Care - PPO

## 2021-05-23 ENCOUNTER — Inpatient Hospital Stay: Payer: BC Managed Care – PPO | Admitting: Nurse Practitioner

## 2021-05-23 ENCOUNTER — Telehealth: Payer: Self-pay | Admitting: Nurse Practitioner

## 2021-05-23 ENCOUNTER — Inpatient Hospital Stay: Payer: BC Managed Care – PPO

## 2021-05-23 NOTE — Telephone Encounter (Signed)
Scheduled per sch msg. Called and spoke with patient. Confirmed new date and time  

## 2021-05-29 NOTE — Progress Notes (Deleted)
[facility]   [phone] [phone]   Clinic Follow up Note   Patient Care Team: [name], MD as PCP - General (Family Medicine) [name], MD as Consulting Physician (General Surgery) Truitt Merle, MD as Consulting Physician (Hematology) Eppie Gibson, MD as Attending Physician (Radiation Oncology) Alla Feeling, NP as Nurse Practitioner (Nurse Practitioner) 05/29/2021  CHIEF COMPLAINT: Follow up left breast cancer and right LCIS  SUMMARY OF ONCOLOGIC HISTORY: Oncology History Overview Note  Cancer Staging Malignant neoplasm of lower-inner quadrant of left breast in female, estrogen receptor positive ([facility]) Staging form: Breast, AJCC 8th Edition - Clinical stage from 01/16/2018: Stage IA (cT1c, cN0, cM0, G2, ER+, PR+, HER2-) - Signed by Truitt Merle, MD on 01/23/2018 - Pathologic: Stage IA (pT1c, pN1, cM0, G1, ER+, PR+, HER2-) - Signed by Eppie Gibson, MD on 04/09/2018     Malignant neoplasm of lower-inner quadrant of left breast in female, estrogen receptor positive ([facility])  01/15/2018 Mammogram   Diagnositc Mammogram 01/15/18  IMPRESSION: 1. Suspicious 1.2 x 1.4 x 1.3 cm mixed echogenicity mass left breast 7 o'clock position retroareolar location at the site of palpable concern.. 2. Indeterminate Within the left breast 7:30 o'clock retroareolar location, adjacent to the palpable mass, is a 0.5 x 0.4 x 0.5 cm oval circumscribed hypoechoic mass. 3. Indeterminate calcifications within the lateral left breast. Location of these calcifications is not definitely confirmed on the true lateral view.    01/16/2018 Initial Biopsy   Diagnosis 01/16/18 1. Breast, left, needle core biopsy, 7:30 o'clock (ribbon clip) - FIBROCYSTIC CHANGES WITH SCLEROSING ADENOSIS AND CALCIFICATIONS. - FIBROADENOMATOID CHANGE. - NO MALIGNANCY IDENTIFIED. 2. Breast, left, needle core biopsy, 7 o'clock position (coil clip) - INVASIVE MAMMARY CARCINOMA, MSBR GRADE  I/II. - SEE MICROSCOPIC DESCRIPTION Microscopic Comment  ADDENDUM: Immunohistochemistry for E-Cadherin is strongly positive in the tumor consistent with ductal carcinoma. (JDP:ah 01/17/18)   01/16/2018 Receptors her2   Estrogen Receptor: 100%, POSITIVE, STRONG STAINING INTENSITY Progesterone Receptor: 50%, POSITIVE, STRONG STAINING INTENSITY Proliferation Marker Ki67: 20% HER2 Negative   01/16/2018 Cancer Staging   Staging form: Breast, AJCC 8th Edition - Clinical stage from 01/16/2018: Stage IA (cT1c, cN0, cM0, G2, ER+, PR+, HER2-) - Signed by Truitt Merle, MD on 01/23/2018   01/22/2018 Initial Diagnosis   Malignant neoplasm of lower-inner quadrant of left breast in female, estrogen receptor positive ([place])   02/21/2018 Surgery    LEFT BREAST LUMPECTOMY WITH AXILLARY LYMPH NODE BIOPSY by Dr. Barry Dienes  02/21/18   02/21/2018 Pathology Results   Diagnosis 02/21/18 1. Breast, lumpectomy, Left - INVASIVE DUCTAL CARCINOMA, GRADE I, 1.6 CM. - DUCTAL CARCINOMA IN SITU, INTERMEDIATE NUCLEAR GRADE. - ANTERIOR AND MEDIAL RESECTION MARGINS ARE POSITIVE FOR CARCINOMA. - NEGATIVE FOR LYMPHOVASCULAR OR PERINEURAL INVASION. - BACKGROUND BREAST TISSUE WITH FIBROCYSTIC CHANGE, INCLUDING SCLEROSING ADENOSIS. - BIOPSY SITE CHANGES. - SEE ONCOLOGY TABLE. 2. Lymph node, sentinel, biopsy, Left Axillary #1 - METASTATIC BREAST CARCINOMA TO A LYMPH NODE, 1.0 CM IN GREATEST DIMENSION, WITH EXTRANODAL EXTENSION (1/1). 3. Lymph node, sentinel, biopsy, Left Axillary #2 - LYMPH NODE, NEGATIVE FOR CARCINOMA (0/1).    02/21/2018 Miscellaneous   Mammaprint 02/21/18 Low Risk with 10-year risk of recurrnce at 10% -No potential signifcant chemotherapy benefit   03/20/2018 Pathology Results   RE-EXCISION OF BREAST LUMPECTOMY by Dr. Barry Dienes  Diagnosis 03/20/18 1. Breast, excision, Left new anterior margin - FIBROCYSTIC CHANGES WITH ADENOSIS AND CALCIFICATIONS. - HEALING BIOPSY SITE. - THERE IS NO EVIDENCE OF  MALIGNANCY. 2. Breast,  excision, Left new medial margin - FIBROCYSTIC CHANGES WITH ADENOSIS AND CALCIFICATIONS. - HEALING BIOPSY SITE. - THERE IS NO EVIDENCE OF MALIGNANCY. Microscopic Comment 1. -2. The surgical resection margin(s) of the specimen were inked and microscopically evaluated. (JBK:kh 03-22-18)   04/09/2018 Cancer Staging   Staging form: Breast, AJCC 8th Edition - Pathologic: Stage IA (pT1c, pN1, cM0, G1, ER+, PR+, HER2-) - Signed by Eppie Gibson, MD on 04/09/2018   04/22/2018 - 06/03/2018 Radiation Therapy   Radaiton with Dr. Isidore Moos 04/22/18-06/03/18   05/2018 -  Anti-estrogen oral therapy   Letrozole 2.32m started 05/2018    Survivorship   Per LCira Rue NP    Lobular carcinoma in situ (LCIS) of right breast  01/20/2020 Mammogram   Diagnostic Mammogram 01/20/20 IMPRESSION: 1.  Stable post lumpectomy changes of the left breast.   2. Suspicious microcalcifications over the right upper outer quadrant spanning 3.6 cm.   02/02/2020 Initial Biopsy   Diagnosis 02/02/20 Breast, right, needle core biopsy, upper outer quadrant, x clip - LOBULAR CARCINOMA IN SITU WITH PLEOMORPHIC FEATURES AND CALCIFICATIONS, INVOLVING ADENOSIS. SEE NOTE Diagnosis Note Immunohistochemical stain for E-cadherin is negative in the lesional cells, consistent with a lobular phenotype. Immunostains for p63, SMM 1 and calponin do not show evidence of invasive carcinoma.    02/04/2020 Initial Diagnosis   Lobular carcinoma in situ (LCIS) of right breast   03/18/2020 Surgery   RIGHT BREAST LUMPECTOMY WITH RADIOACTIVE SEED LOCALIZATION by Dr BAlessandra Bevels   03/18/2020 Pathology Results   FINAL MICROSCOPIC DIAGNOSIS:   A. BREAST, RIGHT, LUMPECTOMY:  - Pleomorphic lobular carcinoma in situ with calcifications and  underlying complex sclerosing lesion, adenosis and fibroadenomatoid  change.  - Margins of resection are not involved (Closest margins: < 1 mm,  anterior, posterior, inferior and medial).  - Biopsy  site.    COMMENT:   P63, Calponin and SMM-1 demonstrate the presence of myoepithelium in the  select focus.      CURRENT THERAPY: Letrozole po daily   INTERVAL HISTORY: Ms. CPrestreturns for follow up as scheduled. She underwent lumpectomy 04/28/21 by Dr. BBarry Dienes    REVIEW OF SYSTEMS:   Constitutional: Denies fevers, chills or abnormal weight loss Eyes: Denies blurriness of vision Ears, nose, mouth, throat, and face: Denies mucositis or sore throat Respiratory: Denies cough, dyspnea or wheezes Cardiovascular: Denies palpitation, chest discomfort or lower extremity swelling Gastrointestinal:  Denies nausea, heartburn or change in bowel habits Skin: Denies abnormal skin rashes Lymphatics: Denies new lymphadenopathy or easy bruising Neurological:Denies numbness, tingling or new weaknesses Behavioral/Psych: Mood is stable, no new changes  All other systems were reviewed with the patient and are negative.  MEDICAL HISTORY:  Past Medical History:  Diagnosis Date   Anxiety    Cancer ([facility] 2022   right breast LCIS   [place] [place]   left breast   Depression    Dysrhythmia    [facility]  History of radiation therapy 04/22/18-06/03/18   Left Breast, left SCV, axilla 50 Gy in 25 fractions, Left breast boost 10 Gy in 5 fractions.    Hypothyroidism    Personal history of radiation therapy    PONV (postoperative nausea and vomiting)    Thyroid disease     SURGICAL HISTORY: Past Surgical History:  Procedure Laterality Date   APPENDECTOMY     BREAST BIOPSY Right 2014   fibroadenoma   BREAST BIOPSY Left 01/16/2018   BREAST BIOPSY Right 02/02/2020  LCIS   BREAST BIOPSY Right 08/04/2020   x2 LCIS   BREAST BIOPSY Right 08/13/2020   x2   BREAST EXCISIONAL BIOPSY Left 1990   benign   BREAST EXCISIONAL BIOPSY Right 02/02/2020   LCIS   BREAST LUMPECTOMY Left 01/22/2018   BREAST LUMPECTOMY WITH AXILLARY LYMPH NODE BIOPSY Left  02/21/2018   Procedure: LEFT BREAST LUMPECTOMY WITH AXILLARY LYMPH NODE BIOPSY;  Surgeon: [name], MD;  Location: [place];  Service: General;  Laterality: Left;   BREAST LUMPECTOMY WITH RADIOACTIVE SEED LOCALIZATION Right 03/18/2020   Procedure: RIGHT BREAST LUMPECTOMY WITH RADIOACTIVE SEED LOCALIZATION;  Surgeon: [name], MD;  Location: [place];  Service: General;  Laterality: Right;   BREAST SURGERY     ENDOMETRIAL ABLATION     EYE SURGERY     GLAUCOMA SURGERY Bilateral    RADIOACTIVE SEED GUIDED EXCISIONAL BREAST BIOPSY Right 04/28/2021   Procedure: RADIOACTIVE SEED GUIDED EXCISIONAL RIGHT BREAST BIOPSY X2;  Surgeon: [name], MD;  Location: [place];  Service: General;  Laterality: Right;   RE-EXCISION OF BREAST LUMPECTOMY Left 03/20/2018   Procedure: RE-EXCISION OF BREAST LUMPECTOMY;  Surgeon: [name], MD;  Location: [place];  Service: General;  Laterality: Left;    I have reviewed the social history and family history with the patient and they are unchanged from previous note.  ALLERGIES:  is allergic to morphine and related.  MEDICATIONS:  Current Outpatient Medications  Medication Sig Dispense Refill   citalopram (CELEXA) 20 MG tablet Take 1 tablet (20 mg total) by mouth daily. 90 tablet 3   letrozole (FEMARA) 2.5 MG tablet TAKE 1 TABLET BY MOUTH EVERY DAY 90 tablet 3   levothyroxine (SYNTHROID) 75 MCG tablet Take 1 tablet (75 mcg total) by mouth daily before breakfast. 90 tablet 3   OVER THE COUNTER MEDICATION Take 1 tablet by mouth daily. Protandim Supplement     PARoxetine (PAXIL) 20 MG tablet Take 20 mg by mouth daily.     Probiotic Product (PROBIOTIC PO) Take 1 capsule by mouth daily.     No current facility-administered medications for this visit.    PHYSICAL EXAMINATION: ECOG PERFORMANCE STATUS: {CHL ONC ECOG PS:[id number]}  There were no vitals filed for this visit. There were no vitals filed for this  visit.  GENERAL:alert, no distress and comfortable SKIN: skin color, texture, turgor are normal, no rashes or significant lesions EYES: normal, Conjunctiva are pink and non-injected, sclera clear OROPHARYNX:no exudate, no erythema and lips, buccal mucosa, and tongue normal  NECK: supple, thyroid normal size, non-tender, without nodularity LYMPH:  no palpable lymphadenopathy in the cervical, axillary or inguinal LUNGS: clear to auscultation and percussion with normal breathing effort HEART: regular rate & rhythm and no murmurs and no lower extremity edema ABDOMEN:abdomen soft, non-tender and normal bowel sounds Musculoskeletal:no cyanosis of digits and no clubbing  NEURO: alert & oriented x 3 with fluent speech, no focal motor/sensory deficits  LABORATORY DATA:  I have reviewed the data as listed CBC Latest Ref Rng & Units 04/12/2021 05/21/2020 03/10/2020  WBC 4.0 - 10.5 K/uL 6.4 6.8 6.8  Hemoglobin 12.0 - 15.0 g/dL 14.6 14.6 15.5(H)  Hematocrit 36.0 - 46.0 % 42.5 41.8 45.9  Platelets 150.0 - 400.0 K/uL 210.0 193 288     CMP Latest Ref Rng & Units 04/12/2021 05/21/2020 03/10/2020  Glucose 70 - 99 mg/dL 91 98 112(H)  BUN 6 - 23 mg/dL 10 13 11   Creatinine 0.40 - 1.20 mg/dL 0.61 0.74  0.62  Sodium 135 - 145 mEq/L 138 138 138  Potassium 3.5 - 5.1 mEq/L 4.0 4.0 4.2  Chloride 96 - 112 mEq/L 100 102 103  CO2 19 - 32 mEq/L 28 31 21(L)  Calcium 8.4 - 10.5 mg/dL 9.8 9.6 9.5  Total Protein 6.0 - 8.3 g/dL 7.3 7.2 7.0  Total Bilirubin 0.2 - 1.2 mg/dL 0.5 0.6 0.5  Alkaline Phos 39 - 117 U/L 84 96 81  AST 0 - 37 U/L 19 16 20   ALT 0 - 35 U/L 14 16 18       RADIOGRAPHIC STUDIES: I have personally reviewed the radiological images as listed and agreed with the findings in the report. No results found.   ASSESSMENT & PLAN:  No problem-specific Assessment & Plan notes found for this encounter.   No orders of the defined types were placed in this encounter.  All questions were answered. The  patient knows to call the clinic with any problems, questions or concerns. No barriers to learning was detected. I spent {CHL ONC TIME VISIT - AEWYB:[id number]} counseling the patient face to face. The total time spent in the appointment was {CHL ONC TIME VISIT - JFTNB:[id number]} and more than 50% was on counseling and review of test results     Alla Feeling, NP 05/29/21

## 2021-05-31 ENCOUNTER — Inpatient Hospital Stay: Payer: BC Managed Care – PPO

## 2021-05-31 ENCOUNTER — Inpatient Hospital Stay: Payer: BC Managed Care – PPO | Admitting: Nurse Practitioner

## 2021-08-26 ENCOUNTER — Encounter (HOSPITAL_COMMUNITY): Payer: Self-pay

## 2021-08-26 ENCOUNTER — Other Ambulatory Visit: Payer: Self-pay

## 2021-08-26 ENCOUNTER — Ambulatory Visit (HOSPITAL_COMMUNITY)
Admission: EM | Admit: 2021-08-26 | Discharge: 2021-08-26 | Discharge status: Against Medical Advice | Payer: BC Managed Care – PPO

## 2021-08-26 NOTE — ED Triage Notes (Signed)
Pt tested positive for covid this morning; pt has cough and nasal congestion.

## 2021-08-28 ENCOUNTER — Other Ambulatory Visit: Payer: Self-pay | Admitting: Hematology

## 2021-12-14 ENCOUNTER — Encounter (HOSPITAL_COMMUNITY): Payer: Self-pay

## 2022-01-11 ENCOUNTER — Other Ambulatory Visit: Payer: Self-pay | Admitting: Family Medicine

## 2022-03-23 DIAGNOSIS — H401131 Primary open-angle glaucoma, bilateral, mild stage: Secondary | ICD-10-CM | POA: Diagnosis not present

## 2022-04-24 ENCOUNTER — Encounter (HOSPITAL_BASED_OUTPATIENT_CLINIC_OR_DEPARTMENT_OTHER): Payer: Self-pay | Admitting: Emergency Medicine

## 2022-04-24 ENCOUNTER — Other Ambulatory Visit: Payer: Self-pay

## 2022-04-24 ENCOUNTER — Emergency Department (HOSPITAL_BASED_OUTPATIENT_CLINIC_OR_DEPARTMENT_OTHER)
Admission: EM | Admit: 2022-04-24 | Discharge: 2022-04-24 | Disposition: A | Discharge status: Home / Self Care | Payer: BC Managed Care – PPO | Attending: Emergency Medicine | Admitting: Emergency Medicine

## 2022-04-24 DIAGNOSIS — M436 Torticollis: Secondary | ICD-10-CM | POA: Diagnosis not present

## 2022-04-24 DIAGNOSIS — Z853 Personal history of malignant neoplasm of breast: Secondary | ICD-10-CM | POA: Diagnosis not present

## 2022-04-24 DIAGNOSIS — M542 Cervicalgia: Secondary | ICD-10-CM | POA: Diagnosis not present

## 2022-04-24 MED ORDER — DEXAMETHASONE SODIUM PHOSPHATE 10 MG/ML IJ SOLN
10.0000 mg | Freq: Once | INTRAMUSCULAR | Status: AC
  Administered 2022-04-24: 10 mg via INTRAMUSCULAR
  Filled 2022-04-24: qty 1

## 2022-04-24 MED ORDER — ACETAMINOPHEN 500 MG PO TABS
1000.0000 mg | ORAL_TABLET | Freq: Once | ORAL | Status: AC
  Administered 2022-04-24: 1000 mg via ORAL
  Filled 2022-04-24: qty 2

## 2022-04-24 MED ORDER — PREDNISONE 20 MG PO TABS: 20.0000 mg | ORAL_TABLET | Freq: Every day | ORAL | 0 refills | Status: AC

## 2022-04-24 MED ORDER — CYCLOBENZAPRINE HCL 10 MG PO TABS: 10.0000 mg | ORAL_TABLET | Freq: Two times a day (BID) | ORAL | 0 refills | Status: DC | PRN

## 2022-04-24 NOTE — ED Notes (Signed)
Patient verbalizes understanding of discharge instructions. Opportunity for questioning and answers were provided. Patient discharged from ED.  °

## 2022-04-24 NOTE — ED Provider Notes (Signed)
[place] EMERGENCY DEPT Provider Note   CSN: [id number] Arrival date & time: 04/24/22  1253     History  Chief Complaint  Patient presents with   Neck Pain    Neck pain    Martha [name] is a 61 y.o. female. With past medical history of breast cancer who presents to the emergency department with neck pain.  States she has had neck pain for 2 to 3 weeks now.  She states that originally she began having right-sided neck pain that she describes as an achiness and tightness.  She states that over the past 2 to 3 weeks this is progressed to left-sided neck pain and she has intermittently had posterior headache.  She has been using multiple pain modalities at home including IcyHot, lidocaine spray, Tylenol.  She states that she has used heat with Epsom salt which has been relieving for her.  Her husband has been attempting massage she has had minimal relief and feels like the symptoms are getting worse.  She denies having severe headache, visual changes.  No head or neck trauma.  No previous neck surgeries.  She does not have any numbness or tingling to the face, arms.  No weakness.  No lower back pain.  She has had no fevers.   Neck Pain      Home Medications Prior to Admission medications   Medication Sig Start Date End Date Taking? Authorizing Provider  cyclobenzaprine (FLEXERIL) 10 MG tablet Take 1 tablet (10 mg total) by mouth 2 (two) times daily as needed for muscle spasms. 04/24/22  Yes Mickie Hillier, PA-C  predniSONE (DELTASONE) 20 MG tablet Take 1 tablet (20 mg total) by mouth daily for 4 days. 04/25/22 04/29/22 Yes Mickie Hillier, PA-C  citalopram (CELEXA) 20 MG tablet TAKE 1 TABLET BY MOUTH EVERY DAY 01/12/22   [name], MD  letrozole [facility]) 2.5 MG tablet TAKE 1 TABLET BY MOUTH EVERY DAY 08/29/21   Truitt Merle, MD  levothyroxine (SYNTHROID) 75 MCG tablet TAKE 1 TABLET BY MOUTH DAILY BEFORE BREAKFAST. 01/12/22   [name], MD  OVER THE COUNTER  MEDICATION Take 1 tablet by mouth daily. Protandim Supplement    [provider]  PARoxetine (PAXIL) 20 MG tablet Take 20 mg by mouth daily.    [provider]  Probiotic Product (PROBIOTIC PO) Take 1 capsule by mouth daily.    [provider]      Allergies    Morphine and related    Review of Systems   Review of Systems  Musculoskeletal:  Positive for neck pain and neck stiffness.  All other systems reviewed and are negative.   Physical Exam Updated Vital Signs BP (!) 153/73 (BP Location: Left Arm)   Pulse 90   Temp 98.5 F (36.9 C)   Resp 18   SpO2 98%  Physical Exam Vitals and nursing note reviewed.  Constitutional:      Appearance: Normal appearance.  HENT:     Head: Normocephalic and atraumatic.  Eyes:     General: No scleral icterus. Neck:     Comments: Creased range of motion of the cervical spine with tenderness over the trapezius on the right and left.  There is no midline tenderness.  Step-offs or deformities. Cardiovascular:     Pulses: Normal pulses.  Pulmonary:     Effort: Pulmonary effort is normal.  Abdominal:     General: Bowel sounds are normal.     Palpations: Abdomen is soft.  Musculoskeletal:     Cervical back: Torticollis and tenderness present. Muscular tenderness present. No spinous process tenderness. Decreased range of motion.  Skin:    General: Skin is warm and dry.     Capillary Refill: Capillary refill takes less than 2 seconds.     Findings: No bruising or erythema.  Neurological:     General: No focal deficit present.     Mental Status: She is alert and oriented to person, place, and time. Mental status is at baseline.     Cranial Nerves: No cranial nerve deficit.     Sensory: No sensory deficit.     Motor: No weakness.  Psychiatric:        Mood and Affect: Mood normal.        Behavior: Behavior normal.        Thought Content: Thought content normal.        Judgment: Judgment normal.    ED Results /  Procedures / Treatments   Labs (all labs ordered are listed, but only abnormal results are displayed) Labs Reviewed - No data to display  EKG None  Radiology No results found.  Procedures Procedures   Medications Ordered in ED Medications  dexamethasone (DECADRON) injection 10 mg (has no administration in time range)  acetaminophen (TYLENOL) tablet 1,000 mg (has no administration in time range)    ED Course/ Medical Decision Making/ A&P                           Medical Decision Making Risk OTC drugs. Prescription drug management.  This patient presents to the ED with chief complaint(s) of neck pain with pertinent past medical history of breast cancer which further complicates the presenting complaint. The complaint involves an extensive differential diagnosis and also carries with it a high risk of complications and morbidity.    The differential diagnosis includes C-spine fracture or subluxation, radiculopathy, meningitis, cervical strain, torticollis, etc.  Additional history obtained: Additional history obtained from  none available Records reviewed Care Everywhere/External Records  ED Course and Reassessment: 61 year old female who presents to the emergency department with 2 to 3 weeks of neck pain.  Physical exam is consistent with torticollis. She has no meningismus, no fever or infectious symptoms concerning for meningitis.  No trauma to the head or C-spine concerning for fracture or subluxation or vertebral artery dissection.  No radiculopathy symptoms.  No stroke symptoms. We will provide with steroids and Tylenol here.  She is driving so we will avoid Valium at this time although I think this may be helpful for her.  We will prescribe her another 4 days of low-dose steroids and Flexeril.  She is encouraged to continue with massage, heat, NSAIDs and gentle range of motion.  She is in agreement with this plan. Safe for discharge  Independent labs interpretation:   The following labs were independently interpreted: N/A  Independent visualization of imaging: - I independently visualized the following imaging with scope of interpretation limited to determining acute life threatening conditions related to emergency care: N/A, which revealed N/A  Consultation: - Consulted or discussed management/test interpretation w/ external professional: N/A I  Consideration for admission or further workup: Not indicated Social Determinants of health: None identified Final Clinical Impression(s) / ED Diagnoses Final diagnoses:  Torticollis    Rx / DC Orders ED Discharge Orders          Ordered    predniSONE (DELTASONE) 20 MG tablet  Daily  04/24/22 1536    cyclobenzaprine (FLEXERIL) 10 MG tablet  2 times daily PRN        04/24/22 1536              Mickie Hillier, PA-C 04/24/22 1543    Elgie Congo, MD 04/25/22 [phone]

## 2022-04-24 NOTE — ED Triage Notes (Signed)
Pt reports about 3-4 weeks of right neck pain, has progressed to her posterior head and left neck. Has tried massage, heat,topical lidocaine, tylenol,which all help temporarily but pain returns. Worse in the am. No weakness noted. Does state she has lots of stress in her life.

## 2022-04-24 NOTE — Discharge Instructions (Addendum)
In the emergency department today for a stiff neck.  This is likely torticollis.  We gave you a dose of steroids here as well as Tylenol.  I am prescribing you form a days of steroids that you can start tomorrow and I have prescribed you a muscle relaxant.  Do not drive, operate heavy machinery, or combine with alcohol or opioids.  This medication may make you little drowsy so may be helpful in the evenings.  Please continue to use heat, massage and gentle range of motion.  Please return for significantly worsening symptoms or if you begin to have numbness or tingling or weakness of your upper extremities.

## 2022-05-04 ENCOUNTER — Encounter: Payer: Self-pay | Admitting: Family Medicine

## 2022-05-04 ENCOUNTER — Ambulatory Visit (INDEPENDENT_AMBULATORY_CARE_PROVIDER_SITE_OTHER): Payer: BC Managed Care – PPO | Admitting: Family Medicine

## 2022-05-04 ENCOUNTER — Ambulatory Visit (INDEPENDENT_AMBULATORY_CARE_PROVIDER_SITE_OTHER)
Admission: RE | Admit: 2022-05-04 | Discharge: 2022-05-04 | Disposition: A | Discharge status: Home / Self Care | Payer: BC Managed Care – PPO | Source: Ambulatory Visit | Attending: Family Medicine | Admitting: Family Medicine

## 2022-05-04 VITALS — BP 128/70 | HR 91 | Temp 98.3°F | Ht 62.0 in | Wt 114.4 lb

## 2022-05-04 DIAGNOSIS — M47812 Spondylosis without myelopathy or radiculopathy, cervical region: Secondary | ICD-10-CM | POA: Diagnosis not present

## 2022-05-04 DIAGNOSIS — N951 Menopausal and female climacteric states: Secondary | ICD-10-CM | POA: Diagnosis not present

## 2022-05-04 DIAGNOSIS — M542 Cervicalgia: Secondary | ICD-10-CM

## 2022-05-04 DIAGNOSIS — F439 Reaction to severe stress, unspecified: Secondary | ICD-10-CM | POA: Diagnosis not present

## 2022-05-04 MED ORDER — KETOROLAC TROMETHAMINE 60 MG/2ML IM SOLN
60.0000 mg | Freq: Once | INTRAMUSCULAR | Status: AC
  Administered 2022-05-04: 60 mg via INTRAMUSCULAR

## 2022-05-04 MED ORDER — TIZANIDINE HCL 4 MG PO TABS: 4.0000 mg | ORAL_TABLET | Freq: Four times a day (QID) | ORAL | 0 refills | Status: DC | PRN

## 2022-05-04 MED ORDER — KETOROLAC TROMETHAMINE 10 MG PO TABS: 10.0000 mg | ORAL_TABLET | Freq: Four times a day (QID) | ORAL | 0 refills | Status: AC | PRN

## 2022-05-04 NOTE — Assessment & Plan Note (Signed)
Has a lot of care giver burden with declining help with her mother.  We discussed referral to therapy versus increasing her Celexa as above however she deferred.

## 2022-05-04 NOTE — Patient Instructions (Signed)
It was very nice to see you today!  We will get x-rays of your back today.  We will give you an injection of an anti-inflammatory medication called Toradol.  Please start the Zanaflex and the by mouth Toradol tomorrow.  I will also refer you to see a physical therapist.  Let me know if not improving in the next couple of weeks.  Take care, Dr [name]  PLEASE NOTE:  If you had any lab tests please let us know if you have not heard back within a few days. You may see your results on mychart before we have a chance to review them but we will give you a call once they are reviewed by [place]. If we ordered any referrals today, please let us know if you have not heard from their office within the next week.   Please try these tips to maintain a healthy lifestyle:  Eat at least 3 REAL meals and 1-2 snacks per day.  Aim for no more than 5 hours between eating.  If you eat breakfast, please do so within one hour of getting up.   Each meal should contain half fruits/vegetables, one quarter protein, and one quarter carbs (no bigger than a computer mouse)  Cut down on sweet beverages. This includes juice, soda, and sweet tea.   Drink at least 1 glass of water with each meal and aim for at least 8 glasses per day  Exercise at least 150 minutes every week.

## 2022-05-04 NOTE — Progress Notes (Signed)
   Martha [name] is a 61 y.o. female who presents today for an office visit.  Assessment/Plan:  New/Acute Problems: Martha [name] Likely musculoskeletal though given that symptoms have been persistent for the last 6 weeks and have had not much improvement with conservative management we will get x-ray to further evaluate.  We will stop Flexeril and try Zanaflex.  We gave 60 mg of Toradol today and will start by mouth Toradol for the next 5 days.  We will place referral for her to see physical therapy.  If her symptoms are not improving in the next 1 to 2 weeks would consider referral to orthopedics or sports medicine versus advanced imaging at that point.  We discussed reasons to return to care and seek emergent care.  Chronic Problems Addressed Today: Menopausal symptom She has been under a lot of stress recently.  She is currently on Celexa 20 mg daily which is working well help control her menopausal symptoms.  We discussed increasing the dose however she deferred for now.  Stress Has a lot of care giver burden with declining help with her mother.  We discussed referral to therapy versus increasing her Celexa as above however she deferred.    Subjective:  HPI:  Patient here for ED follow-up.  Went to the ED on 04/24/2022 with Martha [name] for couple of weeks.  She was diagnosed with torticollis.  She was given dexamethasone in the ED and discharged with Flexeril and prednisone.     Unfortunately this did not provide any benefit.  Symptoms have persisted.  She has been seeing massage therapist which is not helping either.  Symptoms were at their worst yesterday.  No numbness or tingling.  No weakness.  No obvious prescription sitting events though she has been under a lot of stress recently        Objective:  Physical Exam: BP 128/70   Pulse 91   Temp 98.3 F (36.8 C) (Temporal)   Ht '5\' 2"'$  (1.575 m)   Wt 114 lb 6.4 oz (51.9 kg)   SpO2 100%   BMI 20.92 kg/m   Gen: No acute  distress, resting comfortably MSK: - Martha: No deformities.  Tenderness to palpation on along bilateral cervical paraspinal muscles.  Limited range of motion with right rotation due to [name].  Neurovascularly intact distally in upper and lower extremities. Neuro: Grossly normal, moves all extremities Psych: Normal affect and thought content      Martha [name] M. Martha Pain, MD 05/04/2022 1:29 PM

## 2022-05-04 NOTE — Assessment & Plan Note (Signed)
She has been under a lot of stress recently.  She is currently on Celexa 20 mg daily which is working well help control her menopausal symptoms.  We discussed increasing the dose however she deferred for now.

## 2022-05-09 NOTE — Progress Notes (Signed)
Please inform patient of the following:  Xray shows that she has degenerative changes in her neck. Do not need to make any changes to her treatment plan at this time. She should let us know if her symptoms are not improving.  Martha [name]. [name], MD 05/09/2022 7:41 AM

## 2022-05-15 ENCOUNTER — Encounter: Payer: Self-pay | Admitting: Family Medicine

## 2022-05-17 NOTE — Telephone Encounter (Signed)
She has arthritis in her neck which is probably causing most of her symptoms. I think physical therapy is a good idea but we can also send her to sports medicine for further evaluation before getting any advanced imaging.  [name]. [name], MD 05/17/2022 9:27 AM

## 2022-05-19 ENCOUNTER — Encounter: Payer: Self-pay | Admitting: Family Medicine

## 2022-05-19 NOTE — Telephone Encounter (Signed)
I responded to this message 2 days ago. Please see her other mychart message.  Martha [name]. [name], MD 05/19/2022 12:06 PM

## 2022-05-28 NOTE — Therapy (Signed)
OUTPATIENT PHYSICAL THERAPY CERVICAL EVALUATION   Patient Name: Martha [name] MRN: [id number] DOB:09/08/1960, 61 y.o., female Today's Date: 05/30/2022   PT End of Session - 05/29/22 1448     Visit Number 1    Number of Visits 12    Date for PT Re-Evaluation 07/10/22    Authorization Type BCBS    PT Start Time 6546    PT Stop Time 1440    PT Time Calculation (min) 52 min    Activity Tolerance Patient tolerated treatment well    Behavior During Therapy [facility] for tasks assessed/performed             Past Medical History:  Diagnosis Date   Anxiety    Cancer ([place]) 2022   right breast LCIS   Cancer ([place]) 2019   left breast   Depression    Dysrhythmia    SVT, s/p ablation ~ 2012 in at [facility]   History of radiation therapy 04/22/18-06/03/18   Left Breast, left SCV, axilla 50 Gy in 25 fractions, Left breast boost 10 Gy in 5 fractions.    Hypothyroidism    Personal history of radiation therapy    PONV (postoperative nausea and vomiting)    Thyroid disease    Past Surgical History:  Procedure Laterality Date   APPENDECTOMY     BREAST BIOPSY Right 2014   fibroadenoma   BREAST BIOPSY Left 01/16/2018   BREAST BIOPSY Right 02/02/2020   LCIS   BREAST BIOPSY Right 08/04/2020   x2 LCIS   BREAST BIOPSY Right 08/13/2020   x2   BREAST EXCISIONAL BIOPSY Left 1990   benign   BREAST EXCISIONAL BIOPSY Right 02/02/2020   LCIS   BREAST LUMPECTOMY Left 01/22/2018   BREAST LUMPECTOMY WITH AXILLARY LYMPH NODE BIOPSY Left 02/21/2018   Procedure: LEFT BREAST LUMPECTOMY WITH AXILLARY LYMPH NODE BIOPSY;  Surgeon: [name], MD;  Location: [place];  Service: General;  Laterality: Left;   BREAST LUMPECTOMY WITH RADIOACTIVE SEED LOCALIZATION Right 03/18/2020   Procedure: RIGHT BREAST LUMPECTOMY WITH RADIOACTIVE SEED LOCALIZATION;  Surgeon: [name], MD;  Location: [place];  Service: General;  Laterality: Right;   BREAST SURGERY     ENDOMETRIAL ABLATION     EYE  SURGERY     GLAUCOMA SURGERY Bilateral    RADIOACTIVE SEED GUIDED EXCISIONAL BREAST BIOPSY Right 04/28/2021   Procedure: RADIOACTIVE SEED GUIDED EXCISIONAL RIGHT BREAST BIOPSY X2;  Surgeon: [name], MD;  Location: [place];  Service: General;  Laterality: Right;   RE-EXCISION OF BREAST LUMPECTOMY Left 03/20/2018   Procedure: RE-EXCISION OF BREAST LUMPECTOMY;  Surgeon: [name], MD;  Location: [place];  Service: General;  Laterality: Left;   Patient Active Problem List   Diagnosis Date Noted   Stress 05/04/2022   Hyperglycemia 04/14/2021   Lobular carcinoma in situ (LCIS) of right breast 02/04/2020   Malignant neoplasm of lower-inner quadrant of left breast in female, estrogen receptor positive ([place]) 01/22/2018   Hypothyroidism 04/04/2017   Nicotine dependence with current use 04/04/2017   Excessive drinking of alcohol 04/04/2017   Menopausal symptom 04/04/2017     REFERRING PROVIDER: [name], MD  REFERRING DIAG: M54.2 (ICD-10-CM) - Neck pain  THERAPY DIAG:  Cervicalgia  Stiffness of unspecified joint, not elsewhere classified  Rationale for Evaluation and Treatment Rehabilitation  ONSET DATE: July 2023  SUBJECTIVE:  SUBJECTIVE STATEMENT: Pain began in July with no specific MOI.  Pt went to ED on 04/24/22 due to neck pain.  She was dx'd with torticollis and received meds including prednisone.  Pt had no relief with meds.  Pt saw MD on 05/04/22.  She was prescried Toradol and PT was ordered.  Pt reports no improvement with Toradol.  Pt received massages which was helping though had increased pain after her last deep tissue massage.  Pt has tried topical itmes such has IcyHot and Salonpas which has not helped.  Pt bought an over the door  traction unit but has not used it.    Pt has difficulty sleeping at night every night due to pain.  She wakes up twice per night.  Pt has increased pain when getting OOB in the AM and states that is the hardest thing for her.  Pt has difficulty driving and sometimes she is unable to drive due to being unable to turn her head.  Pt is limited and painful with turning head to the R.   Pt states she has a consistent H/A.  Pt c/o's of a constant pain.    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    Aggravating Factors: turning head quickly, turning head to R Easing Factors:  heat, lidocaine spray, using the jet on it in her hot tub  PERTINENT HISTORY:  Hx of Breast CA  PAIN:  NPRS:  Current:  7/10, Best:3-4/10, Worst:  10/10 Location:  R sided cervical paraspinals, R post aspect of head, UT, medial scap, and R post shoulder Type:  dull, aching, sharp, throbbing; constant  PRECAUTIONS: None  WEIGHT BEARING RESTRICTIONS No  FALLS:  Has patient fallen in last 6 months? No  LIVING ENVIRONMENT: Lives with: husband.  Mother lives with her Lives in: 1 story home Stairs: 1 step in home, 3/4 steps in front/back and has rails   OCCUPATION: Pt was working in [employer], but has stopped working now due to taking care of mother.   PLOF: Independent; Pt was able to turn head, drive and perform her daily activities without significant pain or difficulty.   PATIENT GOALS improve flexibility, reduce stress, improve pain  OBJECTIVE:   DIAGNOSTIC FINDINGS:  Cervical X rays on 05/04/22:  FINDINGS: Straightening of the expected cervical lordosis. No significant spondylolisthesis.   Vertebral body height is maintained. No fracture is identified.   Disc space narrowing is greatest at C5-C6 and C6-C7 (moderate to moderately advanced at these levels). Multilevel anterior and posterior osteophytes, and uncovertebral hypertrophy, also greatest at C5-C6 and C6-C7.  Facet arthrosis at C6-C7 (mild) and C7-T1 (mild-to-moderate). Multilevel neural foraminal narrowing, greatest bilaterally at C4-C5, C5-C6 and C6-C7.   IMPRESSION: Cervical spondylosis, as described.   Nonspecific straightening of the expected cervical lordosis  PATIENT SURVEYS:  FOTO 38 with a goal of 57 at visit #12   COGNITION: Overall cognitive status: Within functional limits for tasks assessed   SENSATION: LT:  2+ t/o bilat dermatomes  POSTURE:  FHP  PALPATION: TTP with R > L UT.  tightness in bilat UT with muscle spasms in bilat worse on R.     CERVICAL ROM:   Active ROM A/PROM (deg) eval  Flexion WNL with pain  Extension 38 deg with pain  Right lateral flexion 24 with pain  Left lateral flexion 15 with pain  Right rotation 30 with pain  Left rotation 50 with pain   (Blank rows =  not tested)  UPPER EXTREMITY ROM:  Active ROM Right eval Left eval  Shoulder flexion WNL WNL  Shoulder extension    Shoulder abduction WNL WNL  Shoulder adduction    Shoulder extension    Shoulder internal rotation    Shoulder external rotation    Elbow flexion    Elbow extension    Wrist flexion    Wrist extension    Wrist ulnar deviation    Wrist radial deviation    Wrist pronation    Wrist supination     (Blank rows = not tested)  UPPER EXTREMITY MMT:  MMT Right eval Left eval  Shoulder flexion 5/5 5/5  Shoulder extension    Shoulder abduction 5/5 5/5  Shoulder adduction    Shoulder extension    Shoulder internal rotation    Shoulder external rotation    Middle trapezius    Lower trapezius    Elbow flexion 5/5 5/5  Elbow extension    Wrist flexion    Wrist extension    Wrist ulnar deviation    Wrist radial deviation    Wrist pronation    Wrist supination    Grip strength     (Blank rows = not tested)  CERVICAL SPECIAL TESTS:  Compression Test:  pain in R sided cervical paraspinals Spurling's Test:  R:  pain in R side of head, L:   negative   TODAY'S TREATMENT:  Pt performed cervical rotation, cervical rotation with towel, and seated UT stretch.  Pt received a HEP handout and was educated in correct form and appropriate frequency.  Pt instructed she should not have pain with HEP.  Pt had difficulty with form with cervical rotation with towel and did not include in HEP.     PATIENT EDUCATION:  Education details: HEP, POC, dx, relevant anatomy, rationale of exercises, and objective findings.  PT answered Pt's questions.  Person educated: Patient Education method: Explanation, Demonstration, Tactile cues, Verbal cues, and Handouts Education comprehension: verbalized understanding, returned demonstration, verbal cues required, tactile cues required, and needs further education   HOME EXERCISE PROGRAM: Access Code: [email or web address] URL: [email or web address] Date: 05/29/2022 Prepared by: Ronny Flurry  Exercises - Seated Cervical Rotation AROM  - 2 x daily - 7 x weekly - 2 sets - 10 reps - Seated Upper Trapezius Stretch  - 2 x daily - 7 x weekly - 2-3 reps, 20-30 sec hold  ASSESSMENT:  CLINICAL IMPRESSION: Patient is a 61 y.o. female with a dx of neck pain presenting to the clinic with cervicalgia and joint stiffness in cervical.  Pt has tightness with mm spasms present in bilat UT, R > L.  Pt c/o's of a constant pain and has a consistent H/A.  Pt has difficulty sleeping at night every night due to pain.  Pt has increased pain when getting OOB in the AM and states that is the hardest thing for her.  Pt is limited and painful with turning head to the R and has difficulty driving.   Pt should benefit from skilled PT services to address impairments and to improve overall function.    OBJECTIVE IMPAIRMENTS decreased ROM, hypomobility, increased fascial restrictions, increased muscle spasms, impaired flexibility, postural dysfunction, and pain.   ACTIVITY LIMITATIONS sleeping  PARTICIPATION LIMITATIONS:  driving  PERSONAL FACTORS none  REHAB POTENTIAL: Good  CLINICAL DECISION MAKING: Stable/uncomplicated  EVALUATION COMPLEXITY: Low   GOALS:  SHORT TERM GOALS: Target date: 06/19/2022  Pt will be independent and compliant with HEP for improved pain, ROM,  strength, and function.  Baseline:  Goal status: INITIAL  2.  Pt will demo a 10 deg increase in R cervical rotations and L cervical Sb'ing for improved stiffness and mobility. Baseline:  Goal status: INITIAL  3.  Pt will report at least a 25% improvement in pain and sx's 1st thing in AM.  Baseline:  Goal status: INITIAL    LONG TERM GOALS: Target date: 07/10/2022  Pt will be able to sleep at least 5/7 nights/wk without pain disturbing her. Baseline:  Goal status: INITIAL  2.  Pt will report being able to turn her head in the car while driving without significant pain or limitation.   Baseline:  Goal status: INITIAL  3.  Pt will report at least a 70% improvement in pain and sx's overall.  Baseline:  Goal status: INITIAL  4.  Pt will have reduced pain with cervical AROM and demo improved rotation to 60 deg bilat and > 30 deg in Sb'ing bilat for improved stiffness and mobility.  Baseline:  Goal status: INITIAL     PLAN: PT FREQUENCY: 2x/week  PT DURATION: 6 weeks  PLANNED INTERVENTIONS: Therapeutic exercises, Therapeutic activity, Neuromuscular re-education, Patient/Family education, Self Care, Joint mobilization, Aquatic Therapy, Dry Needling, Electrical stimulation, Spinal mobilization, Cryotherapy, Moist heat, Taping, Ultrasound, Manual therapy, and Re-evaluation  PLAN FOR NEXT SESSION: Review HEP.  Perform seated cervical rotation with towel.  STW to bilat UT and cervical paraspinals.    Selinda Michaels III PT, DPT 05/30/22 9:30 PM

## 2022-05-29 ENCOUNTER — Ambulatory Visit (HOSPITAL_BASED_OUTPATIENT_CLINIC_OR_DEPARTMENT_OTHER): Payer: BC Managed Care – PPO | Attending: Family Medicine | Admitting: Physical Therapy

## 2022-05-29 ENCOUNTER — Other Ambulatory Visit: Payer: Self-pay

## 2022-05-29 DIAGNOSIS — M542 Cervicalgia: Secondary | ICD-10-CM | POA: Diagnosis not present

## 2022-05-30 ENCOUNTER — Encounter (HOSPITAL_BASED_OUTPATIENT_CLINIC_OR_DEPARTMENT_OTHER): Payer: Self-pay | Admitting: Physical Therapy

## 2022-06-07 ENCOUNTER — Ambulatory Visit (HOSPITAL_BASED_OUTPATIENT_CLINIC_OR_DEPARTMENT_OTHER): Payer: BC Managed Care – PPO | Admitting: Physical Therapy

## 2022-06-07 ENCOUNTER — Encounter (HOSPITAL_BASED_OUTPATIENT_CLINIC_OR_DEPARTMENT_OTHER): Payer: Self-pay | Admitting: Physical Therapy

## 2022-06-07 DIAGNOSIS — M542 Cervicalgia: Secondary | ICD-10-CM

## 2022-06-07 NOTE — Therapy (Signed)
OUTPATIENT PHYSICAL THERAPY CERVICAL EVALUATION   Patient Name: Martha [name] MRN: [id number] DOB:21-Aug-1961, 61 y.o., female Today's Date: 06/07/2022   PT End of Session - 06/07/22 0758     Visit Number 2    Number of Visits 12    Date for PT Re-Evaluation 07/10/22    Authorization Type BCBS    PT Start Time 0758    PT Stop Time 3149    PT Time Calculation (min) 45 min    Activity Tolerance Patient tolerated treatment well    Behavior During Therapy [facility] for tasks assessed/performed             Past Medical History:  Diagnosis Date   Anxiety    Cancer ([place]) 2022   right breast LCIS   Cancer ([place]) 2019   left breast   Depression    Dysrhythmia    SVT, s/p ablation ~ 2012 in at [facility]   History of radiation therapy 04/22/18-06/03/18   Left Breast, left SCV, axilla 50 Gy in 25 fractions, Left breast boost 10 Gy in 5 fractions.    Hypothyroidism    Personal history of radiation therapy    PONV (postoperative nausea and vomiting)    Thyroid disease    Past Surgical History:  Procedure Laterality Date   APPENDECTOMY     BREAST BIOPSY Right 2014   fibroadenoma   BREAST BIOPSY Left 01/16/2018   BREAST BIOPSY Right 02/02/2020   LCIS   BREAST BIOPSY Right 08/04/2020   x2 LCIS   BREAST BIOPSY Right 08/13/2020   x2   BREAST EXCISIONAL BIOPSY Left 1990   benign   BREAST EXCISIONAL BIOPSY Right 02/02/2020   LCIS   BREAST LUMPECTOMY Left 01/22/2018   BREAST LUMPECTOMY WITH AXILLARY LYMPH NODE BIOPSY Left 02/21/2018   Procedure: LEFT BREAST LUMPECTOMY WITH AXILLARY LYMPH NODE BIOPSY;  Surgeon: [name], MD;  Location: [place];  Service: General;  Laterality: Left;   BREAST LUMPECTOMY WITH RADIOACTIVE SEED LOCALIZATION Right 03/18/2020   Procedure: RIGHT BREAST LUMPECTOMY WITH RADIOACTIVE SEED LOCALIZATION;  Surgeon: [name], MD;  Location: [place];  Service: General;  Laterality: Right;   BREAST SURGERY     ENDOMETRIAL ABLATION     EYE  SURGERY     GLAUCOMA SURGERY Bilateral    RADIOACTIVE SEED GUIDED EXCISIONAL BREAST BIOPSY Right 04/28/2021   Procedure: RADIOACTIVE SEED GUIDED EXCISIONAL RIGHT BREAST BIOPSY X2;  Surgeon: [name], MD;  Location: [place];  Service: General;  Laterality: Right;   RE-EXCISION OF BREAST LUMPECTOMY Left 03/20/2018   Procedure: RE-EXCISION OF BREAST LUMPECTOMY;  Surgeon: [name], MD;  Location: [place];  Service: General;  Laterality: Left;   Patient Active Problem List   Diagnosis Date Noted   Stress 05/04/2022   Hyperglycemia 04/14/2021   Lobular carcinoma in situ (LCIS) of right breast 02/04/2020   Malignant neoplasm of lower-inner quadrant of left breast in female, estrogen receptor positive ([place]) 01/22/2018   Hypothyroidism 04/04/2017   Nicotine dependence with current use 04/04/2017   Excessive drinking of alcohol 04/04/2017   Menopausal symptom 04/04/2017     REFERRING PROVIDER: [name], MD  REFERRING DIAG: M54.2 (ICD-10-CM) - Neck pain  THERAPY DIAG:  Cervicalgia  Stiffness of unspecified joint, not elsewhere classified  Rationale for Evaluation and Treatment Rehabilitation  ONSET DATE: July 2023  SUBJECTIVE:  SUBJECTIVE STATEMENT: I can move it today but very tight.   PERTINENT HISTORY:  Hx of Breast CA  PAIN:  NPRS:  Current:  8/10 Location:  R sided cervical paraspinals, R post aspect of head, UT, medial scap, and R post shoulder Type:  dull, aching, sharp, throbbing; constant  PRECAUTIONS: None  WEIGHT BEARING RESTRICTIONS No  FALLS:  Has patient fallen in last 6 months? No  LIVING ENVIRONMENT: Lives with: husband.  Mother lives with her Lives in: 1 story home Stairs: 1 step in home, 3/4 steps in  front/back and has rails   OCCUPATION: Pt was working in [employer], but has stopped working now due to taking care of mother.   PLOF: Independent; Pt was able to turn head, drive and perform her daily activities without significant pain or difficulty.   PATIENT GOALS improve flexibility, reduce stress, improve pain  OBJECTIVE:   DIAGNOSTIC FINDINGS:  Cervical X rays on 05/04/22:  FINDINGS: Straightening of the expected cervical lordosis. No significant spondylolisthesis.   Vertebral body height is maintained. No fracture is identified.   Disc space narrowing is greatest at C5-C6 and C6-C7 (moderate to moderately advanced at these levels). Multilevel anterior and posterior osteophytes, and uncovertebral hypertrophy, also greatest at C5-C6 and C6-C7. Facet arthrosis at C6-C7 (mild) and C7-T1 (mild-to-moderate). Multilevel neural foraminal narrowing, greatest bilaterally at C4-C5, C5-C6 and C6-C7.   IMPRESSION: Cervical spondylosis, as described.   Nonspecific straightening of the expected cervical lordosis  PATIENT SURVEYS:  FOTO 38 with a goal of 57 at visit #12  SENSATION: LT:  2+ t/o bilat dermatomes  POSTURE:  FHP  PALPATION: TTP with R > L UT.  tightness in bilat UT with muscle spasms in bilat worse on R.     CERVICAL ROM:   Active ROM A/PROM (deg) eval  Flexion WNL with pain  Extension 38 deg with pain  Right lateral flexion 24 with pain  Left lateral flexion 15 with pain  Right rotation 30 with pain  Left rotation 50 with pain   (Blank rows = not tested)  UPPER EXTREMITY ROM:  Active ROM Right eval Left eval  Shoulder flexion WNL WNL  Shoulder extension    Shoulder abduction WNL WNL  Shoulder adduction    Shoulder extension    Shoulder internal rotation    Shoulder external rotation    Elbow flexion    Elbow extension    Wrist flexion    Wrist extension    Wrist ulnar deviation    Wrist radial deviation    Wrist pronation    Wrist  supination     (Blank rows = not tested)  UPPER EXTREMITY MMT:  MMT Right eval Left eval  Shoulder flexion 5/5 5/5  Shoulder extension    Shoulder abduction 5/5 5/5  Shoulder adduction    Shoulder extension    Shoulder internal rotation    Shoulder external rotation    Middle trapezius    Lower trapezius    Elbow flexion 5/5 5/5  Elbow extension    Wrist flexion    Wrist extension    Wrist ulnar deviation    Wrist radial deviation    Wrist pronation    Wrist supination    Grip strength     (Blank rows = not tested)  CERVICAL SPECIAL TESTS:  Compression Test:  pain in R sided cervical paraspinals Spurling's Test:  R:  pain in R side of head, L:  negative   TODAY'S TREATMENT:  Treatment  06/06/22:  Trigger Point Dry Needling, Manual Therapy Treatment:  Initial or subsequent education regarding Trigger Point Dry Needling: Initial Did patient give consent to treatment with Trigger Point Dry Needling: Yes TPDN with skilled palpation and monitoring followed by STM to the following muscles: bil upper traps, bil cervical paraspinals C3-5  Manual traction+rotattion+ rotation mobs of lower cervical spine Supine C0 on 1 AP mobs in retraction+Lt sidebend Mcconnell tape bil upper trap inhibition Kinesiotape to encourage Rt to left pull of C5/6 with Rt cervical rotation  Supine chin tuck+rotation Standing midline posture in mirror Seated postural alignment with scap retraction   PATIENT EDUCATION:  Education details: HEP, POC, dx, relevant anatomy, rationale of exercises, and objective findings.  PT answered Pt's questions.  Person educated: Patient Education method: Explanation, Demonstration, Tactile cues, Verbal cues, and Handouts Education comprehension: verbalized understanding, returned demonstration, verbal cues required, tactile cues required, and needs further education   HOME EXERCISE PROGRAM: Access Code: [email or web address] URL:  [email or web address]  ASSESSMENT:  CLINICAL IMPRESSION: Decreased suboccipital impingement on the right when lower cervical joints were mobilized with cervical rotation. Bil scapular elevation notable prior to DN. Pt tends to rest in Rt cervical sidebend- asked her to look in mirrors/reflective surfaces frequently to improve proprioception.    OBJECTIVE IMPAIRMENTS decreased ROM, hypomobility, increased fascial restrictions, increased muscle spasms, impaired flexibility, postural dysfunction, and pain.   ACTIVITY LIMITATIONS sleeping  PARTICIPATION LIMITATIONS: driving  PERSONAL FACTORS none  REHAB POTENTIAL: Good  CLINICAL DECISION MAKING: Stable/uncomplicated  EVALUATION COMPLEXITY: Low   GOALS:  SHORT TERM GOALS: Target date: 06/19/2022  Pt will be independent and compliant with HEP for improved pain, ROM, strength, and function.  Baseline:  Goal status: INITIAL  2.  Pt will demo a 10 deg increase in R cervical rotations and L cervical Sb'ing for improved stiffness and mobility. Baseline:  Goal status: INITIAL  3.  Pt will report at least a 25% improvement in pain and sx's 1st thing in AM.  Baseline:  Goal status: INITIAL    LONG TERM GOALS: Target date: 07/10/2022  Pt will be able to sleep at least 5/7 nights/wk without pain disturbing her. Baseline:  Goal status: INITIAL  2.  Pt will report being able to turn her head in the car while driving without significant pain or limitation.   Baseline:  Goal status: INITIAL  3.  Pt will report at least a 70% improvement in pain and sx's overall.  Baseline:  Goal status: INITIAL  4.  Pt will have reduced pain with cervical AROM and demo improved rotation to 60 deg bilat and > 30 deg in Sb'ing bilat for improved stiffness and mobility.  Baseline:  Goal status: INITIAL     PLAN: PT FREQUENCY: 2x/week  PT DURATION: 6 weeks  PLANNED INTERVENTIONS: Therapeutic exercises, Therapeutic activity,  Neuromuscular re-education, Patient/Family education, Self Care, Joint mobilization, Aquatic Therapy, Dry Needling, Electrical stimulation, Spinal mobilization, Cryotherapy, Moist heat, Taping, Ultrasound, Manual therapy, and Re-evaluation  PLAN FOR NEXT SESSION: continue DN and manual therapy. Motion with visual cues   Desmond Tufano C. Deyon Chizek PT, DPT 06/07/22 8:47 PM

## 2022-06-12 NOTE — Therapy (Unsigned)
OUTPATIENT PHYSICAL THERAPY CERVICAL EVALUATION   Patient Name: Martha [name] MRN: [id number] DOB:1960-09-03, 60 y.o., female Today's Date: 06/13/2022   PT End of Session - 06/13/22 1559     Visit Number 3    Number of Visits 12    Date for PT Re-Evaluation 07/10/22    Authorization Type BCBS    PT Start Time 9373    PT Stop Time 1558    PT Time Calculation (min) 43 min    Activity Tolerance Patient tolerated treatment well    Behavior During Therapy [facility] for tasks assessed/performed              Past Medical History:  Diagnosis Date   Anxiety    Cancer ([place]) 2022   right breast LCIS   Cancer ([place]) 2019   left breast   Depression    Dysrhythmia    SVT, s/p ablation ~ 2012 in at [facility]   History of radiation therapy 04/22/18-06/03/18   Left Breast, left SCV, axilla 50 Gy in 25 fractions, Left breast boost 10 Gy in 5 fractions.    Hypothyroidism    Personal history of radiation therapy    PONV (postoperative nausea and vomiting)    Thyroid disease    Past Surgical History:  Procedure Laterality Date   APPENDECTOMY     BREAST BIOPSY Right 2014   fibroadenoma   BREAST BIOPSY Left 01/16/2018   BREAST BIOPSY Right 02/02/2020   LCIS   BREAST BIOPSY Right 08/04/2020   x2 LCIS   BREAST BIOPSY Right 08/13/2020   x2   BREAST EXCISIONAL BIOPSY Left 1990   benign   BREAST EXCISIONAL BIOPSY Right 02/02/2020   LCIS   BREAST LUMPECTOMY Left 01/22/2018   BREAST LUMPECTOMY WITH AXILLARY LYMPH NODE BIOPSY Left 02/21/2018   Procedure: LEFT BREAST LUMPECTOMY WITH AXILLARY LYMPH NODE BIOPSY;  Surgeon: [name], MD;  Location: [place];  Service: General;  Laterality: Left;   BREAST LUMPECTOMY WITH RADIOACTIVE SEED LOCALIZATION Right 03/18/2020   Procedure: RIGHT BREAST LUMPECTOMY WITH RADIOACTIVE SEED LOCALIZATION;  Surgeon: [name], MD;  Location: [place];  Service: General;  Laterality: Right;   BREAST SURGERY     ENDOMETRIAL ABLATION     EYE  SURGERY     GLAUCOMA SURGERY Bilateral    RADIOACTIVE SEED GUIDED EXCISIONAL BREAST BIOPSY Right 04/28/2021   Procedure: RADIOACTIVE SEED GUIDED EXCISIONAL RIGHT BREAST BIOPSY X2;  Surgeon: [name], MD;  Location: [place];  Service: General;  Laterality: Right;   RE-EXCISION OF BREAST LUMPECTOMY Left 03/20/2018   Procedure: RE-EXCISION OF BREAST LUMPECTOMY;  Surgeon: [name], MD;  Location: [place];  Service: General;  Laterality: Left;   Patient Active Problem List   Diagnosis Date Noted   Stress 05/04/2022   Hyperglycemia 04/14/2021   Lobular carcinoma in situ (LCIS) of right breast 02/04/2020   Malignant neoplasm of lower-inner quadrant of left breast in female, estrogen receptor positive ([place]) 01/22/2018   Hypothyroidism 04/04/2017   Nicotine dependence with current use 04/04/2017   Excessive drinking of alcohol 04/04/2017   Menopausal symptom 04/04/2017     REFERRING PROVIDER: [name], MD  REFERRING DIAG: M54.2 (ICD-10-CM) - Neck pain  THERAPY DIAG:  Cervicalgia  Stiffness of unspecified joint, not elsewhere classified  Rationale for Evaluation and Treatment Rehabilitation  ONSET DATE: July 2023  SUBJECTIVE:  SUBJECTIVE STATEMENT: The dry needling helped a lot last session.   PERTINENT HISTORY:  Hx of Breast CA  PAIN:  NPRS:  Current:  8/10 Location:  R sided cervical paraspinals, R post aspect of head, UT, medial scap, and R post shoulder Type:  dull, aching, sharp, throbbing; constant  PRECAUTIONS: None  WEIGHT BEARING RESTRICTIONS No  FALLS:  Has patient fallen in last 6 months? No  LIVING ENVIRONMENT: Lives with: husband.  Mother lives with her Lives in: 1 story home Stairs: 1 step in home, 3/4 steps in  front/back and has rails   OCCUPATION: Pt was working in [employer], but has stopped working now due to taking care of mother.   PLOF: Independent; Pt was able to turn head, drive and perform her daily activities without significant pain or difficulty.   PATIENT GOALS improve flexibility, reduce stress, improve pain  OBJECTIVE:   DIAGNOSTIC FINDINGS:  Cervical X rays on 05/04/22:  FINDINGS: Straightening of the expected cervical lordosis. No significant spondylolisthesis.   Vertebral body height is maintained. No fracture is identified.   Disc space narrowing is greatest at C5-C6 and C6-C7 (moderate to moderately advanced at these levels). Multilevel anterior and posterior osteophytes, and uncovertebral hypertrophy, also greatest at C5-C6 and C6-C7. Facet arthrosis at C6-C7 (mild) and C7-T1 (mild-to-moderate). Multilevel neural foraminal narrowing, greatest bilaterally at C4-C5, C5-C6 and C6-C7.   IMPRESSION: Cervical spondylosis, as described.   Nonspecific straightening of the expected cervical lordosis  PATIENT SURVEYS:  FOTO 38 with a goal of 57 at visit #12  SENSATION: LT:  2+ t/o bilat dermatomes  POSTURE:  FHP  PALPATION: TTP with R > L UT.  tightness in bilat UT with muscle spasms in bilat worse on R.     CERVICAL ROM:   Active ROM A/PROM (deg) eval  Flexion WNL with pain  Extension 38 deg with pain  Right lateral flexion 24 with pain  Left lateral flexion 15 with pain  Right rotation 30 with pain  Left rotation 50 with pain   (Blank rows = not tested)  UPPER EXTREMITY ROM:  Active ROM Right eval Left eval  Shoulder flexion WNL WNL  Shoulder extension    Shoulder abduction WNL WNL  Shoulder adduction    Shoulder extension    Shoulder internal rotation    Shoulder external rotation    Elbow flexion    Elbow extension    Wrist flexion    Wrist extension    Wrist ulnar deviation    Wrist radial deviation    Wrist pronation    Wrist  supination     (Blank rows = not tested)  UPPER EXTREMITY MMT:  MMT Right eval Left eval  Shoulder flexion 5/5 5/5  Shoulder extension    Shoulder abduction 5/5 5/5  Shoulder adduction    Shoulder extension    Shoulder internal rotation    Shoulder external rotation    Middle trapezius    Lower trapezius    Elbow flexion 5/5 5/5  Elbow extension    Wrist flexion    Wrist extension    Wrist ulnar deviation    Wrist radial deviation    Wrist pronation    Wrist supination    Grip strength     (Blank rows = not tested)  CERVICAL SPECIAL TESTS:  Compression Test:  pain in R sided cervical paraspinals Spurling's Test:  R:  pain in R side of head, L:  negative   TODAY'S TREATMENT:  Treatment  06/13/22: Trigger Point Dry Needling, Manual Therapy Treatment:  Initial or subsequent education regarding Trigger Point Dry Needling: Initial Did patient give consent to treatment with Trigger Point Dry Needling: Yes TPDN with skilled palpation and monitoring followed by STM to the following muscles: bil upper traps, bil cervical paraspinals C3-5  Manual traction+rotattion+ rotation mobs of lower cervical spine Supine C0 on 1 AP mobs in retraction+Lt sidebend Mcconnell tape bil upper trap inhibition Kinesiotape to encourage Rt to left pull of C5/6 with Rt cervical rotation  Supine chin tuck+rotation Standing midline posture in mirror Seated postural alignment with scap retraction Treatment                            06/06/22:  Trigger Point Dry Needling, Manual Therapy Treatment:  Initial or subsequent education regarding Trigger Point Dry Needling: Initial Did patient give consent to treatment with Trigger Point Dry Needling: Yes TPDN with skilled palpation and monitoring followed by STM to the following muscles: bil upper traps, bil cervical paraspinals C3-5  Manual traction+rotattion+ rotation mobs of lower cervical spine Supine C0 on 1 AP mobs in  retraction+Lt sidebend Mcconnell tape bil upper trap inhibition Kinesiotape to encourage Rt to left pull of C5/6 with Rt cervical rotation  Supine chin tuck+rotation Standing midline posture in mirror Seated postural alignment with scap retraction   PATIENT EDUCATION:  Education details: HEP, POC, dx, relevant anatomy, rationale of exercises, and objective findings.  PT answered Pt's questions.  Person educated: Patient Education method: Explanation, Demonstration, Tactile cues, Verbal cues, and Handouts Education comprehension: verbalized understanding, returned demonstration, verbal cues required, tactile cues required, and needs further education   HOME EXERCISE PROGRAM: Access Code: [email or web address] URL: [email or web address]  ASSESSMENT:  CLINICAL IMPRESSION: Pt responds well to the TPDN, has had multiple twitches on the L and only one on the R despite R feeling tighter. No adverse effects noted with TPDN or taping today. She feels as though she is benefiting from the modalities and plans to continue working on her posture at home.  Improvements with holding head in neutral position today.    OBJECTIVE IMPAIRMENTS decreased ROM, hypomobility, increased fascial restrictions, increased muscle spasms, impaired flexibility, postural dysfunction, and pain.   ACTIVITY LIMITATIONS sleeping  PARTICIPATION LIMITATIONS: driving  PERSONAL FACTORS none  REHAB POTENTIAL: Good  CLINICAL DECISION MAKING: Stable/uncomplicated  EVALUATION COMPLEXITY: Low   GOALS:  SHORT TERM GOALS: Target date: 06/19/2022  Pt will be independent and compliant with HEP for improved pain, ROM, strength, and function.  Baseline:  Goal status: INITIAL  2.  Pt will demo a 10 deg increase in R cervical rotations and L cervical Sb'ing for improved stiffness and mobility. Baseline:  Goal status: INITIAL  3.  Pt will report at least a 25% improvement in pain and sx's 1st thing in AM.  Baseline:   Goal status: INITIAL    LONG TERM GOALS: Target date: 07/10/2022  Pt will be able to sleep at least 5/7 nights/wk without pain disturbing her. Baseline:  Goal status: INITIAL  2.  Pt will report being able to turn her head in the car while driving without significant pain or limitation.   Baseline:  Goal status: INITIAL  3.  Pt will report at least a 70% improvement in pain and sx's overall.  Baseline:  Goal status: INITIAL  4.  Pt will have reduced pain with cervical AROM and demo improved rotation to 60 deg bilat and > 30 deg  in [place] for improved stiffness and mobility.  Baseline:  Goal status: INITIAL     PLAN: PT FREQUENCY: 2x/week  PT DURATION: 6 weeks  PLANNED INTERVENTIONS: Therapeutic exercises, Therapeutic activity, Neuromuscular re-education, Patient/Family education, Self Care, Joint mobilization, Aquatic Therapy, Dry Needling, Electrical stimulation, Spinal mobilization, Cryotherapy, Moist heat, Taping, Ultrasound, Manual therapy, and Re-evaluation  PLAN FOR NEXT SESSION: continue DN and manual therapy. Motion with visual cues   Rudi Heap PT, DPT 06/13/22  4:00 PM

## 2022-06-13 ENCOUNTER — Ambulatory Visit (HOSPITAL_BASED_OUTPATIENT_CLINIC_OR_DEPARTMENT_OTHER): Payer: BC Managed Care – PPO | Admitting: Physical Therapy

## 2022-06-13 ENCOUNTER — Encounter (HOSPITAL_BASED_OUTPATIENT_CLINIC_OR_DEPARTMENT_OTHER): Payer: BC Managed Care – PPO | Admitting: Physical Therapy

## 2022-06-13 ENCOUNTER — Encounter (HOSPITAL_BASED_OUTPATIENT_CLINIC_OR_DEPARTMENT_OTHER): Payer: Self-pay | Admitting: Physical Therapy

## 2022-06-13 DIAGNOSIS — M542 Cervicalgia: Secondary | ICD-10-CM

## 2022-06-13 NOTE — Therapy (Unsigned)
OUTPATIENT PHYSICAL THERAPY CERVICAL EVALUATION   Patient Name: Martha [name] MRN: [id number] DOB:1960-11-13, 61 y.o., female Today's Date: 06/15/2022    Past Medical History:  Diagnosis Date   Anxiety    Cancer [facility]) 2022   right breast LCIS   Cancer ([place]) 2019   left breast   Depression    Dysrhythmia    SVT, s/p ablation ~ 2012 in at [facility]   History of radiation therapy 04/22/18-06/03/18   Left Breast, left SCV, axilla 50 Gy in 25 fractions, Left breast boost 10 Gy in 5 fractions.    Hypothyroidism    Personal history of radiation therapy    PONV (postoperative nausea and vomiting)    Thyroid disease    Past Surgical History:  Procedure Laterality Date   APPENDECTOMY     BREAST BIOPSY Right 2014   fibroadenoma   BREAST BIOPSY Left 01/16/2018   BREAST BIOPSY Right 02/02/2020   LCIS   BREAST BIOPSY Right 08/04/2020   x2 LCIS   BREAST BIOPSY Right 08/13/2020   x2   BREAST EXCISIONAL BIOPSY Left 1990   benign   BREAST EXCISIONAL BIOPSY Right 02/02/2020   LCIS   BREAST LUMPECTOMY Left 01/22/2018   BREAST LUMPECTOMY WITH AXILLARY LYMPH NODE BIOPSY Left 02/21/2018   Procedure: LEFT BREAST LUMPECTOMY WITH AXILLARY LYMPH NODE BIOPSY;  Surgeon: [name], MD;  Location: [place];  Service: General;  Laterality: Left;   BREAST LUMPECTOMY WITH RADIOACTIVE SEED LOCALIZATION Right 03/18/2020   Procedure: RIGHT BREAST LUMPECTOMY WITH RADIOACTIVE SEED LOCALIZATION;  Surgeon: [name], MD;  Location: [place];  Service: General;  Laterality: Right;   BREAST SURGERY     ENDOMETRIAL ABLATION     EYE SURGERY     GLAUCOMA SURGERY Bilateral    RADIOACTIVE SEED GUIDED EXCISIONAL BREAST BIOPSY Right 04/28/2021   Procedure: RADIOACTIVE SEED GUIDED EXCISIONAL RIGHT BREAST BIOPSY X2;  Surgeon: [name], MD;  Location: [place];  Service: General;  Laterality: Right;   RE-EXCISION OF BREAST LUMPECTOMY Left 03/20/2018   Procedure:  RE-EXCISION OF BREAST LUMPECTOMY;  Surgeon: [name], MD;  Location: [place];  Service: General;  Laterality: Left;   Patient Active Problem List   Diagnosis Date Noted   Stress 05/04/2022   Hyperglycemia 04/14/2021   Lobular carcinoma in situ (LCIS) of right breast 02/04/2020   Malignant neoplasm of lower-inner quadrant of left breast in female, estrogen receptor positive ([place]) 01/22/2018   Hypothyroidism 04/04/2017   Nicotine dependence with current use 04/04/2017   Excessive drinking of alcohol 04/04/2017   Menopausal symptom 04/04/2017     REFERRING PROVIDER: [name], MD  REFERRING DIAG: M54.2 (ICD-10-CM) - Neck pain  THERAPY DIAG:  Cervicalgia  Stiffness of unspecified joint, not elsewhere classified  Rationale for Evaluation and Treatment Rehabilitation  ONSET DATE: July 2023  SUBJECTIVE:  SUBJECTIVE STATEMENT: Pt states that she has had a lot of popping and clicking and is very happy.  PERTINENT HISTORY:  Hx of Breast CA  PAIN:  NPRS:  Current:  8/10 Location:  R sided cervical paraspinals, R post aspect of head, UT, medial scap, and R post shoulder Type:  dull, aching, sharp, throbbing; constant  PRECAUTIONS: None  WEIGHT BEARING RESTRICTIONS No  FALLS:  Has patient fallen in last 6 months? No  LIVING ENVIRONMENT: Lives with: husband.  Mother lives with her Lives in: 1 story home Stairs: 1 step in home, 3/4 steps in front/back and has rails   OCCUPATION: Pt was working in [employer], but has stopped working now due to taking care of mother.   PLOF: Independent; Pt was able to turn head, drive and perform her daily activities without significant pain or difficulty.   PATIENT GOALS improve flexibility, reduce stress,  improve pain  OBJECTIVE:   DIAGNOSTIC FINDINGS:  Cervical X rays on 05/04/22:  FINDINGS: Straightening of the expected cervical lordosis. No significant spondylolisthesis.   Vertebral body height is maintained. No fracture is identified.   Disc space narrowing is greatest at C5-C6 and C6-C7 (moderate to moderately advanced at these levels). Multilevel anterior and posterior osteophytes, and uncovertebral hypertrophy, also greatest at C5-C6 and C6-C7. Facet arthrosis at C6-C7 (mild) and C7-T1 (mild-to-moderate). Multilevel neural foraminal narrowing, greatest bilaterally at C4-C5, C5-C6 and C6-C7.   IMPRESSION: Cervical spondylosis, as described.   Nonspecific straightening of the expected cervical lordosis  PATIENT SURVEYS:  FOTO 38 with a goal of 57 at visit #12  SENSATION: LT:  2+ t/o bilat dermatomes  POSTURE:  FHP  PALPATION: TTP with R > L UT.  tightness in bilat UT with muscle spasms in bilat worse on R.     CERVICAL ROM:   Active ROM A/PROM (deg) eval  Flexion WNL with pain  Extension 38 deg with pain  Right lateral flexion 24 with pain  Left lateral flexion 15 with pain  Right rotation 30 with pain  Left rotation 50 with pain   (Blank rows = not tested)  UPPER EXTREMITY ROM:  Active ROM Right eval Left eval  Shoulder flexion WNL WNL  Shoulder extension    Shoulder abduction WNL WNL  Shoulder adduction    Shoulder extension    Shoulder internal rotation    Shoulder external rotation    Elbow flexion    Elbow extension    Wrist flexion    Wrist extension    Wrist ulnar deviation    Wrist radial deviation    Wrist pronation    Wrist supination     (Blank rows = not tested)  UPPER EXTREMITY MMT:  MMT Right eval Left eval  Shoulder flexion 5/5 5/5  Shoulder extension    Shoulder abduction 5/5 5/5  Shoulder adduction    Shoulder extension    Shoulder internal rotation    Shoulder external rotation    Middle trapezius    Lower trapezius     Elbow flexion 5/5 5/5  Elbow extension    Wrist flexion    Wrist extension    Wrist ulnar deviation    Wrist radial deviation    Wrist pronation    Wrist supination    Grip strength     (Blank rows = not tested)  CERVICAL SPECIAL TESTS:  Compression Test:  pain in R sided cervical paraspinals Spurling's Test:  R:  pain in R side of head, L:  negative  TODAY'S TREATMENT:  Treatment                            06/15/22: Trigger Point Dry Needling, Manual Therapy Treatment:  Initial or subsequent education regarding Trigger Point Dry Needling: Initial Did patient give consent to treatment with Trigger Point Dry Needling: Yes E-Stim: 4mu, mod intensity to L UT only TPDN with skilled palpation and monitoring followed by STM to the following muscles: bil upper traps, bil cervical paraspinals C3-5, R SCM, R RCP major/ minor Manual traction+rotattion+ rotation mobs of lower cervical spine Supine C0 on 1 AP mobs in retraction+Lt sidebend  Supine chin tuck+rotation  Treatment                            06/13/22: Trigger Point Dry Needling, Manual Therapy Treatment:  Initial or subsequent education regarding Trigger Point Dry Needling: Initial Did patient give consent to treatment with Trigger Point Dry Needling: Yes TPDN with skilled palpation and monitoring followed by STM to the following muscles: bil upper traps, bil cervical paraspinals C3-5  Manual traction+rotattion+ rotation mobs of lower cervical spine Supine C0 on 1 AP mobs in retraction+Lt sidebend Mcconnell tape bil upper trap inhibition Kinesiotape to encourage Rt to left pull of C5/6 with Rt cervical rotation  Supine chin tuck+rotation Standing midline posture in mirror Seated postural alignment with scap retraction Treatment                            06/06/22:  Trigger Point Dry Needling, Manual Therapy Treatment:  Initial or subsequent education regarding Trigger Point Dry Needling: Initial Did patient give  consent to treatment with Trigger Point Dry Needling: Yes TPDN with skilled palpation and monitoring followed by STM to the following muscles: bil upper traps, bil cervical paraspinals C3-5  Manual traction+rotattion+ rotation mobs of lower cervical spine Supine C0 on 1 AP mobs in retraction+Lt sidebend Mcconnell tape bil upper trap inhibition Kinesiotape to encourage Rt to left pull of C5/6 with Rt cervical rotation  Supine chin tuck+rotation Standing midline posture in mirror Seated postural alignment with scap retraction   PATIENT EDUCATION:  Education details: HEP, POC, dx, relevant anatomy, rationale of exercises, and objective findings.  PT answered Pt's questions.  Person educated: Patient Education method: Explanation, Demonstration, Tactile cues, Verbal cues, and Handouts Education comprehension: verbalized understanding, returned demonstration, verbal cues required, tactile cues required, and needs further education   HOME EXERCISE PROGRAM: Access Code: [email or web address]: [email or web address]  ASSESSMENT:  CLINICAL IMPRESSION: Pt responds well to the TPDN and estim today. Targeted more muscle in subcapitals today and R SCM due to localized pain at orientation  No adverse effects noted with TPDN or estim today. She demonstrates improved cervical ROM after. Improvements with holding head in neutral position today.    OBJECTIVE IMPAIRMENTS decreased ROM, hypomobility, increased fascial restrictions, increased muscle spasms, impaired flexibility, postural dysfunction, and pain.   ACTIVITY LIMITATIONS sleeping  PARTICIPATION LIMITATIONS: driving  PERSONAL FACTORS none  REHAB POTENTIAL: Good  CLINICAL DECISION MAKING: Stable/uncomplicated  EVALUATION COMPLEXITY: Low   GOALS:  SHORT TERM GOALS: Target date: 06/19/2022  Pt will be independent and compliant with HEP for improved pain, ROM, strength, and function.  Baseline:  Goal status: INITIAL  2.  Pt  will demo a 10 deg increase in R cervical rotations and L cervical Sb'ing for improved stiffness and mobility.  Baseline:  Goal status: INITIAL  3.  Pt will report at least a 25% improvement in pain and sx's 1st thing in AM.  Baseline:  Goal status: INITIAL    LONG TERM GOALS: Target date: 07/10/2022  Pt will be able to sleep at least 5/7 nights/wk without pain disturbing her. Baseline:  Goal status: INITIAL  2.  Pt will report being able to turn her head in the car while driving without significant pain or limitation.   Baseline:  Goal status: INITIAL  3.  Pt will report at least a 70% improvement in pain and sx's overall.  Baseline:  Goal status: INITIAL  4.  Pt will have reduced pain with cervical AROM and demo improved rotation to 60 deg bilat and > 30 deg in Sb'ing bilat for improved stiffness and mobility.  Baseline:  Goal status: INITIAL     PLAN: PT FREQUENCY: 2x/week  PT DURATION: 6 weeks  PLANNED INTERVENTIONS: Therapeutic exercises, Therapeutic activity, Neuromuscular re-education, Patient/Family education, Self Care, Joint mobilization, Aquatic Therapy, Dry Needling, Electrical stimulation, Spinal mobilization, Cryotherapy, Moist heat, Taping, Ultrasound, Manual therapy, and Re-evaluation  PLAN FOR NEXT SESSION: continue DN and manual therapy. Motion with visual cues   Rudi Heap PT, DPT 06/15/22  4:05 PM

## 2022-06-15 ENCOUNTER — Telehealth: Payer: Self-pay | Admitting: Family Medicine

## 2022-06-15 ENCOUNTER — Encounter (HOSPITAL_BASED_OUTPATIENT_CLINIC_OR_DEPARTMENT_OTHER): Payer: BC Managed Care – PPO | Admitting: Physical Therapy

## 2022-06-15 ENCOUNTER — Ambulatory Visit (HOSPITAL_BASED_OUTPATIENT_CLINIC_OR_DEPARTMENT_OTHER): Payer: BC Managed Care – PPO | Admitting: Physical Therapy

## 2022-06-15 DIAGNOSIS — M542 Cervicalgia: Secondary | ICD-10-CM | POA: Diagnosis not present

## 2022-06-15 NOTE — Telephone Encounter (Signed)
Patient states: -Husband was just diagnosed with Flu- A and was prescribed tamiflu by his PCP who is at [employer] [facility]  - Patient states his PCP recommended her and her mother, who were around him, take or keep tamiflu on hand  - She and her mother are vaccinated against the flu and are not having any symptoms currently   Please advise.

## 2022-06-16 ENCOUNTER — Ambulatory Visit (INDEPENDENT_AMBULATORY_CARE_PROVIDER_SITE_OTHER): Payer: BC Managed Care – PPO | Admitting: Internal Medicine

## 2022-06-16 ENCOUNTER — Encounter: Payer: Self-pay | Admitting: Internal Medicine

## 2022-06-16 VITALS — BP 115/63 | HR 99 | Temp 101.2°F | Ht 62.0 in | Wt 109.2 lb

## 2022-06-16 DIAGNOSIS — J111 Influenza due to unidentified influenza virus with other respiratory manifestations: Secondary | ICD-10-CM

## 2022-06-16 DIAGNOSIS — M542 Cervicalgia: Secondary | ICD-10-CM | POA: Insufficient documentation

## 2022-06-16 DIAGNOSIS — R509 Fever, unspecified: Secondary | ICD-10-CM

## 2022-06-16 LAB — POCT INFLUENZA A/B
Influenza A, POC: NEGATIVE
Influenza B, POC: NEGATIVE

## 2022-06-16 MED ORDER — CYCLOBENZAPRINE HCL 10 MG PO TABS: 10.0000 mg | ORAL_TABLET | Freq: Three times a day (TID) | ORAL | 0 refills | Status: DC | PRN

## 2022-06-16 MED ORDER — OSELTAMIVIR PHOSPHATE 75 MG PO CAPS: 75.0000 mg | ORAL_CAPSULE | Freq: Two times a day (BID) | ORAL | 0 refills | Status: DC

## 2022-06-16 NOTE — Telephone Encounter (Signed)
Please see message and advise 

## 2022-06-16 NOTE — Telephone Encounter (Signed)
Looks like she saw Dr Randol Kern this morning for this.  [name]. [name], MD 06/16/2022 12:42 PM

## 2022-06-16 NOTE — Assessment & Plan Note (Signed)
She request rf cyclobenzaprine.

## 2022-06-16 NOTE — Progress Notes (Signed)
[place] at [name]:  [phone]   Routine Medical Office Visit  Patient:  Martha [name]      Age: 61 y.o.       Sex:  female  Date:   06/16/2022  PCP:    [name], [place] Provider: Loralee Pacas, MD  Assessment/Plan:   Carie was seen today for fever and generalized body aches.  Fever, unspecified fever cause -     POCT Influenza A/B  Cervicalgia Overview: Since July 2023 Heating / hot tub / dry needling/ phys therapy help  Assessment & Plan: She request rf cyclobenzaprine.  Orders: -     Cyclobenzaprine HCl; Take 1 tablet (10 mg total) by mouth 3 (three) times daily as needed for muscle spasms.  Dispense: 30 tablet; Refill: 0  Flu -     Oseltamivir Phosphate; Take 1 capsule (75 mg total) by mouth 2 (two) times daily.  Dispense: 20 capsule; Refill: 0     Even though the flu test is negative today she has symptoms which are classic for flu and extreme exposure and I think this exposure could go on for as long as 20 days given the number of people around her that her having it so I gave her 20-day supply prophylactic flu meds.  I also refilled the Flexeril as requested and I encouraged her to keep taking ibuprofen and Robitussin for symptomatic control   Today's key discussion points - also in After Visit Summary (AVS) Common side effects, risks, benefits, and alternatives for medications and treatment plan prescribed today were discussed, and she expressed understanding of the given instructions.  Medication list was reconciled and patient instructions and summary information was documented and made available for her to review in the AVS (see AVS).  This note is also available to patient for review for accuracy and understanding. She was encouraged to contact our office by phone or message via MyChart if she has any questions or concerns regarding our treatment plan (see AVS).  No barriers to understanding were  identified   Subjective:   Martha [name] is a 61 y.o. female with PMH significant for: Past Medical History:  Diagnosis Date   Anxiety    Cancer ([place]) 2022   right breast LCIS   Cancer ([place]) 2019   left breast   Depression    Dysrhythmia    SVT, s/p ablation ~ 2012 in at [facility]   History of radiation therapy 04/22/18-06/03/18   Left Breast, left SCV, axilla 50 Gy in 25 fractions, Left breast boost 10 Gy in 5 fractions.    Hypothyroidism    Personal history of radiation therapy    PONV (postoperative nausea and vomiting)    Thyroid disease      She is presenting today with: Chief Complaint  Patient presents with   Fever    Pt has fever of 101.2 and husband was diagnosis with flu A yesterday   Generalized Body Aches     Additional physician collected history: See Assessment/Plan section for per problem updates to history (overview and a/p subsections) as reported by patient today. She requests refill for cyclobenzaprine for her cervicalgia which is already being managed elsewhere She is having fever dry cough and body aches for about 1 day.  Flu a/b neg - but exposed to husband yesterday who has flu a and everyone in his office too She has had flu vaccine just last week  Objective:  Physical Exam: BP 115/63 (BP Location: Left Arm, Patient Position: Sitting)   Pulse 99   Temp (!) 101.2 F (38.4 C) (Temporal)   Ht '5\' 2"'$  (1.575 m)   Wt 109 lb 3.2 oz (49.5 kg)   SpO2 99%   BMI 19.97 kg/m   She is a polite, friendly, and genuine person Constitutional: NAD, AAO, not ill-appearing  Neuro: alert, no focal deficit obvious, articulate speech Psych: normal mood, behavior, thought content   Problem specific physical exam findings:  Frequent dry cough, no increased work of breathing, wearing mask Results:  Results for orders placed or performed in visit on 06/16/22  POCT Influenza A/B  Result Value Ref Range   Influenza A, POC Negative  Negative   Influenza B, POC Negative Negative     Recent Results (from the past 2160 hour(s))  POCT Influenza A/B     Status: None   Collection Time: 06/16/22 11:26 AM  Result Value Ref Range   Influenza A, POC Negative Negative   Influenza B, POC Negative Negative

## 2022-06-19 NOTE — Therapy (Deleted)
OUTPATIENT PHYSICAL THERAPY CERVICAL EVALUATION   Patient Name: Martha [name] MRN: [id number] DOB:06-08-1961, 61 y.o., female Today's Date: 06/19/2022    Past Medical History:  Diagnosis Date   Anxiety    Cancer [facility]) 2022   right breast LCIS   Cancer ([place]) 2019   left breast   Depression    Dysrhythmia    SVT, s/p ablation ~ 2012 in at [facility]   History of radiation therapy 04/22/18-06/03/18   Left Breast, left SCV, axilla 50 Gy in 25 fractions, Left breast boost 10 Gy in 5 fractions.    Hypothyroidism    Personal history of radiation therapy    PONV (postoperative nausea and vomiting)    Thyroid disease    Past Surgical History:  Procedure Laterality Date   APPENDECTOMY     BREAST BIOPSY Right 2014   fibroadenoma   BREAST BIOPSY Left 01/16/2018   BREAST BIOPSY Right 02/02/2020   LCIS   BREAST BIOPSY Right 08/04/2020   x2 LCIS   BREAST BIOPSY Right 08/13/2020   x2   BREAST EXCISIONAL BIOPSY Left 1990   benign   BREAST EXCISIONAL BIOPSY Right 02/02/2020   LCIS   BREAST LUMPECTOMY Left 01/22/2018   BREAST LUMPECTOMY WITH AXILLARY LYMPH NODE BIOPSY Left 02/21/2018   Procedure: LEFT BREAST LUMPECTOMY WITH AXILLARY LYMPH NODE BIOPSY;  Surgeon: [name], MD;  Location: [place];  Service: General;  Laterality: Left;   BREAST LUMPECTOMY WITH RADIOACTIVE SEED LOCALIZATION Right 03/18/2020   Procedure: RIGHT BREAST LUMPECTOMY WITH RADIOACTIVE SEED LOCALIZATION;  Surgeon: [name], MD;  Location: [place];  Service: General;  Laterality: Right;   BREAST SURGERY     ENDOMETRIAL ABLATION     EYE SURGERY     GLAUCOMA SURGERY Bilateral    RADIOACTIVE SEED GUIDED EXCISIONAL BREAST BIOPSY Right 04/28/2021   Procedure: RADIOACTIVE SEED GUIDED EXCISIONAL RIGHT BREAST BIOPSY X2;  Surgeon: [name], MD;  Location: [place];  Service: General;  Laterality: Right;   RE-EXCISION OF BREAST LUMPECTOMY Left 03/20/2018   Procedure:  RE-EXCISION OF BREAST LUMPECTOMY;  Surgeon: [name], MD;  Location: [place];  Service: General;  Laterality: Left;   Patient Active Problem List   Diagnosis Date Noted   Cervicalgia 06/16/2022   Stress 05/04/2022   Hyperglycemia 04/14/2021   Lobular carcinoma in situ (LCIS) of right breast 02/04/2020   Malignant neoplasm of lower-inner quadrant of left breast in female, estrogen receptor positive ([place]) 01/22/2018   Hypothyroidism 04/04/2017   Nicotine dependence with current use 04/04/2017   Excessive drinking of alcohol 04/04/2017   Menopausal symptom 04/04/2017     REFERRING PROVIDER: [name], MD  REFERRING DIAG: M54.2 (ICD-10-CM) - Neck pain  THERAPY DIAG:  No diagnosis found.  Stiffness of unspecified joint, not elsewhere classified  Rationale for Evaluation and Treatment Rehabilitation  ONSET DATE: July 2023  SUBJECTIVE:  SUBJECTIVE STATEMENT: Pt states that she has had a lot of popping and clicking and is very happy.  PERTINENT HISTORY:  Hx of Breast CA  PAIN:  NPRS:  Current:  8/10 Location:  R sided cervical paraspinals, R post aspect of head, UT, medial scap, and R post shoulder Type:  dull, aching, sharp, throbbing; constant  PRECAUTIONS: None  WEIGHT BEARING RESTRICTIONS No  FALLS:  Has patient fallen in last 6 months? No  LIVING ENVIRONMENT: Lives with: husband.  Mother lives with her Lives in: 1 story home Stairs: 1 step in home, 3/4 steps in front/back and has rails   OCCUPATION: Pt was working in [employer], but has stopped working now due to taking care of mother.   PLOF: Independent; Pt was able to turn head, drive and perform her daily activities without significant pain or difficulty.   PATIENT GOALS  improve flexibility, reduce stress, improve pain  OBJECTIVE:   DIAGNOSTIC FINDINGS:  Cervical X rays on 05/04/22:  FINDINGS: Straightening of the expected cervical lordosis. No significant spondylolisthesis.   Vertebral body height is maintained. No fracture is identified.   Disc space narrowing is greatest at C5-C6 and C6-C7 (moderate to moderately advanced at these levels). Multilevel anterior and posterior osteophytes, and uncovertebral hypertrophy, also greatest at C5-C6 and C6-C7. Facet arthrosis at C6-C7 (mild) and C7-T1 (mild-to-moderate). Multilevel neural foraminal narrowing, greatest bilaterally at C4-C5, C5-C6 and C6-C7.   IMPRESSION: Cervical spondylosis, as described.   Nonspecific straightening of the expected cervical lordosis  PATIENT SURVEYS:  FOTO 38 with a goal of 57 at visit #12  SENSATION: LT:  2+ t/o bilat dermatomes  POSTURE:  FHP  PALPATION: TTP with R > L UT.  tightness in bilat UT with muscle spasms in bilat worse on R.     CERVICAL ROM:   Active ROM A/PROM (deg) eval  Flexion WNL with pain  Extension 38 deg with pain  Right lateral flexion 24 with pain  Left lateral flexion 15 with pain  Right rotation 30 with pain  Left rotation 50 with pain   (Blank rows = not tested)  UPPER EXTREMITY ROM:  Active ROM Right eval Left eval  Shoulder flexion WNL WNL  Shoulder extension    Shoulder abduction WNL WNL  Shoulder adduction    Shoulder extension    Shoulder internal rotation    Shoulder external rotation    Elbow flexion    Elbow extension    Wrist flexion    Wrist extension    Wrist ulnar deviation    Wrist radial deviation    Wrist pronation    Wrist supination     (Blank rows = not tested)  UPPER EXTREMITY MMT:  MMT Right eval Left eval  Shoulder flexion 5/5 5/5  Shoulder extension    Shoulder abduction 5/5 5/5  Shoulder adduction    Shoulder extension    Shoulder internal rotation    Shoulder external rotation     Middle trapezius    Lower trapezius    Elbow flexion 5/5 5/5  Elbow extension    Wrist flexion    Wrist extension    Wrist ulnar deviation    Wrist radial deviation    Wrist pronation    Wrist supination    Grip strength     (Blank rows = not tested)  CERVICAL SPECIAL TESTS:  Compression Test:  pain in R sided cervical paraspinals Spurling's Test:  R:  pain in R side of head, L:  negative  TODAY'S TREATMENT:  Treatment                            06/15/22: Trigger Point Dry Needling, Manual Therapy Treatment:  Initial or subsequent education regarding Trigger Point Dry Needling: Initial Did patient give consent to treatment with Trigger Point Dry Needling: Yes E-Stim: 68mu, mod intensity to L UT only TPDN with skilled palpation and monitoring followed by STM to the following muscles: bil upper traps, bil cervical paraspinals C3-5, R SCM, R RCP major/ minor Manual traction+rotattion+ rotation mobs of lower cervical spine Supine C0 on 1 AP mobs in retraction+Lt sidebend  Supine chin tuck+rotation  Treatment                            06/13/22: Trigger Point Dry Needling, Manual Therapy Treatment:  Initial or subsequent education regarding Trigger Point Dry Needling: Initial Did patient give consent to treatment with Trigger Point Dry Needling: Yes TPDN with skilled palpation and monitoring followed by STM to the following muscles: bil upper traps, bil cervical paraspinals C3-5  Manual traction+rotattion+ rotation mobs of lower cervical spine Supine C0 on 1 AP mobs in retraction+Lt sidebend Mcconnell tape bil upper trap inhibition Kinesiotape to encourage Rt to left pull of C5/6 with Rt cervical rotation  Supine chin tuck+rotation Standing midline posture in mirror Seated postural alignment with scap retraction Treatment                            06/06/22:  Trigger Point Dry Needling, Manual Therapy Treatment:  Initial or subsequent education regarding Trigger Point Dry  Needling: Initial Did patient give consent to treatment with Trigger Point Dry Needling: Yes TPDN with skilled palpation and monitoring followed by STM to the following muscles: bil upper traps, bil cervical paraspinals C3-5  Manual traction+rotattion+ rotation mobs of lower cervical spine Supine C0 on 1 AP mobs in retraction+Lt sidebend Mcconnell tape bil upper trap inhibition Kinesiotape to encourage Rt to left pull of C5/6 with Rt cervical rotation  Supine chin tuck+rotation Standing midline posture in mirror Seated postural alignment with scap retraction   PATIENT EDUCATION:  Education details: HEP, POC, dx, relevant anatomy, rationale of exercises, and objective findings.  PT answered Pt's questions.  Person educated: Patient Education method: Explanation, Demonstration, Tactile cues, Verbal cues, and Handouts Education comprehension: verbalized understanding, returned demonstration, verbal cues required, tactile cues required, and needs further education   HOME EXERCISE PROGRAM: Access Code: [email or web address]: [email or web address]  ASSESSMENT:  CLINICAL IMPRESSION: Pt responds well to the TPDN and estim today. Targeted more muscle in subcapitals today and R SCM due to localized pain at orientation  No adverse effects noted with TPDN or estim today. She demonstrates improved cervical ROM after. Improvements with holding head in neutral position today.    OBJECTIVE IMPAIRMENTS decreased ROM, hypomobility, increased fascial restrictions, increased muscle spasms, impaired flexibility, postural dysfunction, and pain.   ACTIVITY LIMITATIONS sleeping  PARTICIPATION LIMITATIONS: driving  PERSONAL FACTORS none  REHAB POTENTIAL: Good  CLINICAL DECISION MAKING: Stable/uncomplicated  EVALUATION COMPLEXITY: Low   GOALS:  SHORT TERM GOALS: Target date: 06/19/2022  Pt will be independent and compliant with HEP for improved pain, ROM, strength, and function.   Baseline:  Goal status: INITIAL  2.  Pt will demo a 10 deg increase in R cervical rotations and L cervical Sb'ing for improved stiffness and mobility.  Baseline:  Goal status: INITIAL  3.  Pt will report at least a 25% improvement in pain and sx's 1st thing in AM.  Baseline:  Goal status: INITIAL    LONG TERM GOALS: Target date: 07/10/2022  Pt will be able to sleep at least 5/7 nights/wk without pain disturbing her. Baseline:  Goal status: INITIAL  2.  Pt will report being able to turn her head in the car while driving without significant pain or limitation.   Baseline:  Goal status: INITIAL  3.  Pt will report at least a 70% improvement in pain and sx's overall.  Baseline:  Goal status: INITIAL  4.  Pt will have reduced pain with cervical AROM and demo improved rotation to 60 deg bilat and > 30 deg in Sb'ing bilat for improved stiffness and mobility.  Baseline:  Goal status: INITIAL     PLAN: PT FREQUENCY: 2x/week  PT DURATION: 6 weeks  PLANNED INTERVENTIONS: Therapeutic exercises, Therapeutic activity, Neuromuscular re-education, Patient/Family education, Self Care, Joint mobilization, Aquatic Therapy, Dry Needling, Electrical stimulation, Spinal mobilization, Cryotherapy, Moist heat, Taping, Ultrasound, Manual therapy, and Re-evaluation  PLAN FOR NEXT SESSION: continue DN and manual therapy. Motion with visual cues   Rudi Heap PT, DPT 06/19/22  2:24 PM

## 2022-06-20 ENCOUNTER — Ambulatory Visit (HOSPITAL_BASED_OUTPATIENT_CLINIC_OR_DEPARTMENT_OTHER): Payer: BC Managed Care – PPO | Admitting: Physical Therapy

## 2022-06-20 ENCOUNTER — Encounter (HOSPITAL_BASED_OUTPATIENT_CLINIC_OR_DEPARTMENT_OTHER): Payer: BC Managed Care – PPO | Admitting: Physical Therapy

## 2022-06-22 ENCOUNTER — Encounter (HOSPITAL_BASED_OUTPATIENT_CLINIC_OR_DEPARTMENT_OTHER): Payer: Self-pay | Admitting: Physical Therapy

## 2022-06-22 ENCOUNTER — Encounter (HOSPITAL_BASED_OUTPATIENT_CLINIC_OR_DEPARTMENT_OTHER): Payer: BC Managed Care – PPO | Admitting: Physical Therapy

## 2022-06-22 ENCOUNTER — Ambulatory Visit (HOSPITAL_BASED_OUTPATIENT_CLINIC_OR_DEPARTMENT_OTHER): Payer: BC Managed Care – PPO | Admitting: Physical Therapy

## 2022-06-22 DIAGNOSIS — M542 Cervicalgia: Secondary | ICD-10-CM | POA: Diagnosis not present

## 2022-06-22 NOTE — Therapy (Signed)
OUTPATIENT PHYSICAL THERAPY CERVICAL EVALUATION   Patient Name: Martha [name] MRN: [id number] DOB:23-Feb-1961, 61 y.o., female Today's Date: 06/22/2022  Visit #: 5  Start: 03:15pm End: 03:55pm    Past Medical History:  Diagnosis Date   Anxiety    Cancer ([place]) 2022   right breast LCIS   Cancer ([place]) 2019   left breast   Depression    Dysrhythmia    SVT, s/p ablation ~ 2012 in at [facility]   History of radiation therapy 04/22/18-06/03/18   Left Breast, left SCV, axilla 50 Gy in 25 fractions, Left breast boost 10 Gy in 5 fractions.    Hypothyroidism    Personal history of radiation therapy    PONV (postoperative nausea and vomiting)    Thyroid disease    Past Surgical History:  Procedure Laterality Date   APPENDECTOMY     BREAST BIOPSY Right 2014   fibroadenoma   BREAST BIOPSY Left 01/16/2018   BREAST BIOPSY Right 02/02/2020   LCIS   BREAST BIOPSY Right 08/04/2020   x2 LCIS   BREAST BIOPSY Right 08/13/2020   x2   BREAST EXCISIONAL BIOPSY Left 1990   benign   BREAST EXCISIONAL BIOPSY Right 02/02/2020   LCIS   BREAST LUMPECTOMY Left 01/22/2018   BREAST LUMPECTOMY WITH AXILLARY LYMPH NODE BIOPSY Left 02/21/2018   Procedure: LEFT BREAST LUMPECTOMY WITH AXILLARY LYMPH NODE BIOPSY;  Surgeon: [name], MD;  Location: [facility];  Service: General;  Laterality: Left;   BREAST LUMPECTOMY WITH RADIOACTIVE SEED LOCALIZATION Right 03/18/2020   Procedure: RIGHT BREAST LUMPECTOMY WITH RADIOACTIVE SEED LOCALIZATION;  Surgeon: [name], MD;  Location: [facility];  Service: General;  Laterality: Right;   BREAST SURGERY     ENDOMETRIAL ABLATION     EYE SURGERY     GLAUCOMA SURGERY Bilateral    RADIOACTIVE SEED GUIDED EXCISIONAL BREAST BIOPSY Right 04/28/2021   Procedure: RADIOACTIVE SEED GUIDED EXCISIONAL RIGHT BREAST BIOPSY X2;  Surgeon: [name], MD;  Location: [facility];  Service: General;  Laterality: Right;   RE-EXCISION OF BREAST  LUMPECTOMY Left 03/20/2018   Procedure: RE-EXCISION OF BREAST LUMPECTOMY;  Surgeon: [name], MD;  Location: [facility];  Service: General;  Laterality: Left;   Patient Active Problem List   Diagnosis Date Noted   Cervicalgia 06/16/2022   Stress 05/04/2022   Hyperglycemia 04/14/2021   Lobular carcinoma in situ (LCIS) of right breast 02/04/2020   Malignant neoplasm of lower-inner quadrant of left breast in female, estrogen receptor positive ([place]) 01/22/2018   Hypothyroidism 04/04/2017   Nicotine dependence with current use 04/04/2017   Excessive drinking of alcohol 04/04/2017   Menopausal symptom 04/04/2017     REFERRING PROVIDER: [name], MD  REFERRING DIAG: M54.2 (ICD-10-CM) - Neck pain  THERAPY DIAG:  Cervicalgia  Stiffness of unspecified joint, not elsewhere classified  Rationale for Evaluation and Treatment Rehabilitation  ONSET DATE: July 2023  SUBJECTIVE:  SUBJECTIVE STATEMENT: Pt states that she had a fever on Tuesday and feels though it loosened things up. She is feeling a lot less tension.   PERTINENT HISTORY:  Hx of Breast CA  PAIN:  NPRS:  Current:  8/10 Location:  R sided cervical paraspinals, R post aspect of head, UT, medial scap, and R post shoulder Type:  dull, aching, sharp, throbbing; constant  PRECAUTIONS: None  WEIGHT BEARING RESTRICTIONS No  FALLS:  Has patient fallen in last 6 months? No  LIVING ENVIRONMENT: Lives with: husband.  Mother lives with her Lives in: 1 story home Stairs: 1 step in home, 3/4 steps in front/back and has rails   OCCUPATION: Pt was working in [employer], but has stopped working now due to taking care of mother.   PLOF: Independent; Pt was able to turn head, drive and perform her  daily activities without significant pain or difficulty.   PATIENT GOALS improve flexibility, reduce stress, improve pain  OBJECTIVE:   DIAGNOSTIC FINDINGS:  Cervical X rays on 05/04/22:  FINDINGS: Straightening of the expected cervical lordosis. No significant spondylolisthesis.   Vertebral body height is maintained. No fracture is identified.   Disc space narrowing is greatest at C5-C6 and C6-C7 (moderate to moderately advanced at these levels). Multilevel anterior and posterior osteophytes, and uncovertebral hypertrophy, also greatest at C5-C6 and C6-C7. Facet arthrosis at C6-C7 (mild) and C7-T1 (mild-to-moderate). Multilevel neural foraminal narrowing, greatest bilaterally at C4-C5, C5-C6 and C6-C7.   IMPRESSION: Cervical spondylosis, as described.   Nonspecific straightening of the expected cervical lordosis  PATIENT SURVEYS:  FOTO 38 with a goal of 57 at visit #12  SENSATION: LT:  2+ t/o bilat dermatomes  POSTURE:  FHP  PALPATION: TTP with R > L UT.  tightness in bilat UT with muscle spasms in bilat worse on R.     CERVICAL ROM:   Active ROM A/PROM (deg) eval  Flexion WNL with pain  Extension 38 deg with pain  Right lateral flexion 24 with pain  Left lateral flexion 15 with pain  Right rotation 30 with pain  Left rotation 50 with pain   (Blank rows = not tested)  UPPER EXTREMITY ROM:  Active ROM Right eval Left eval  Shoulder flexion WNL WNL  Shoulder extension    Shoulder abduction WNL WNL  Shoulder adduction    Shoulder extension    Shoulder internal rotation    Shoulder external rotation    Elbow flexion    Elbow extension    Wrist flexion    Wrist extension    Wrist ulnar deviation    Wrist radial deviation    Wrist pronation    Wrist supination     (Blank rows = not tested)  UPPER EXTREMITY MMT:  MMT Right eval Left eval  Shoulder flexion 5/5 5/5  Shoulder extension    Shoulder abduction 5/5 5/5  Shoulder adduction    Shoulder  extension    Shoulder internal rotation    Shoulder external rotation    Middle trapezius    Lower trapezius    Elbow flexion 5/5 5/5  Elbow extension    Wrist flexion    Wrist extension    Wrist ulnar deviation    Wrist radial deviation    Wrist pronation    Wrist supination    Grip strength     (Blank rows = not tested)  CERVICAL SPECIAL TESTS:  Compression Test:  pain in R sided cervical paraspinals Spurling's Test:  R:  pain  in R side of head, L:  negative   TODAY'S TREATMENT:   Treatment                            06/22/22: Trigger Point Dry Needling, Manual Therapy Treatment:  Did patient give consent to treatment with Trigger Point Dry Needling: Yes E-Stim: No TPDN with skilled palpation and monitoring followed by STM to the following muscles: bil cervical paraspinals C3-5 Manual traction+rotattion+ rotation mobs of lower cervical spine Supine PA mobs and uni mobs to C2-C4 Seated chin tuck  Seated chin tuck with end range flexion  SNAGS Bilat   Treatment                            06/15/22: Trigger Point Dry Needling, Manual Therapy Treatment:  Initial or subsequent education regarding Trigger Point Dry Needling: Initial Did patient give consent to treatment with Trigger Point Dry Needling: Yes E-Stim: 3mu, mod intensity to L UT only TPDN with skilled palpation and monitoring followed by STM to the following muscles: bil upper traps, bil cervical paraspinals C3-5, R SCM, R RCP major/ minor Manual traction+rotattion+ rotation mobs of lower cervical spine Supine C0 on 1 AP mobs in retraction+Lt sidebend  Supine chin tuck+rotation  Treatment                            06/13/22: Trigger Point Dry Needling, Manual Therapy Treatment:  Initial or subsequent education regarding Trigger Point Dry Needling: Initial Did patient give consent to treatment with Trigger Point Dry Needling: Yes TPDN with skilled palpation and monitoring followed by STM to the following  muscles: bil upper traps, bil cervical paraspinals C3-5  Manual traction+rotattion+ rotation mobs of lower cervical spine Supine C0 on 1 AP mobs in retraction+Lt sidebend Mcconnell tape bil upper trap inhibition Kinesiotape to encourage Rt to left pull of C5/6 with Rt cervical rotation  Supine chin tuck+rotation Standing midline posture in mirror Seated postural alignment with scap retraction Treatment                            06/06/22:  Trigger Point Dry Needling, Manual Therapy Treatment:  Initial or subsequent education regarding Trigger Point Dry Needling: Initial Did patient give consent to treatment with Trigger Point Dry Needling: Yes TPDN with skilled palpation and monitoring followed by STM to the following muscles: bil upper traps, bil cervical paraspinals C3-5  Manual traction+rotattion+ rotation mobs of lower cervical spine Supine C0 on 1 AP mobs in retraction+Lt sidebend Mcconnell tape bil upper trap inhibition Kinesiotape to encourage Rt to left pull of C5/6 with Rt cervical rotation  Supine chin tuck+rotation Standing midline posture in mirror Seated postural alignment with scap retraction   PATIENT EDUCATION:  Education details: HEP, POC, dx, relevant anatomy, rationale of exercises, and objective findings.  PT answered Pt's questions.  Person educated: Patient Education method: Explanation, Demonstration, Tactile cues, Verbal cues, and Handouts Education comprehension: verbalized understanding, returned demonstration, verbal cues required, tactile cues required, and needs further education   HOME EXERCISE PROGRAM: Access Code: [email or web address]: [email or web address]  ASSESSMENT:  CLINICAL IMPRESSION: Pt continues to report improvements with TPDN. While she responds well to TPDN and STM with immediate improvements with ROM, she does not demonstrate carry over session to session. She continues to be very limited with cervical rotation to  the R>L. She  tolerated prescribed exercises well today with no adverse effects noted. Improvements with holding head in neutral position today. Pt will continue to benefit from skilled PT to address continued deficits.    OBJECTIVE IMPAIRMENTS decreased ROM, hypomobility, increased fascial restrictions, increased muscle spasms, impaired flexibility, postural dysfunction, and pain.   ACTIVITY LIMITATIONS sleeping  PARTICIPATION LIMITATIONS: driving  PERSONAL FACTORS none  REHAB POTENTIAL: Good  CLINICAL DECISION MAKING: Stable/uncomplicated  EVALUATION COMPLEXITY: Low   GOALS:  SHORT TERM GOALS: Target date: 06/19/2022  Pt will be independent and compliant with HEP for improved pain, ROM, strength, and function.  Baseline:  Goal status: INITIAL  2.  Pt will demo a 10 deg increase in R cervical rotations and L cervical Sb'ing for improved stiffness and mobility. Baseline:  Goal status: INITIAL  3.  Pt will report at least a 25% improvement in pain and sx's 1st thing in AM.  Baseline:  Goal status: INITIAL    LONG TERM GOALS: Target date: 07/10/2022  Pt will be able to sleep at least 5/7 nights/wk without pain disturbing her. Baseline:  Goal status: INITIAL  2.  Pt will report being able to turn her head in the car while driving without significant pain or limitation.   Baseline:  Goal status: INITIAL  3.  Pt will report at least a 70% improvement in pain and sx's overall.  Baseline:  Goal status: INITIAL  4.  Pt will have reduced pain with cervical AROM and demo improved rotation to 60 deg bilat and > 30 deg in Sb'ing bilat for improved stiffness and mobility.  Baseline:  Goal status: INITIAL     PLAN: PT FREQUENCY: 2x/week  PT DURATION: 6 weeks  PLANNED INTERVENTIONS: Therapeutic exercises, Therapeutic activity, Neuromuscular re-education, Patient/Family education, Self Care, Joint mobilization, Aquatic Therapy, Dry Needling, Electrical stimulation, Spinal  mobilization, Cryotherapy, Moist heat, Taping, Ultrasound, Manual therapy, and Re-evaluation  PLAN FOR NEXT SESSION: continue DN and manual therapy. Deep neck flexor strengthening.   Rudi Heap PT, DPT 06/22/22  4:10 PM

## 2022-06-25 ENCOUNTER — Other Ambulatory Visit: Payer: Self-pay

## 2022-06-25 ENCOUNTER — Encounter (HOSPITAL_BASED_OUTPATIENT_CLINIC_OR_DEPARTMENT_OTHER): Payer: Self-pay

## 2022-06-25 ENCOUNTER — Emergency Department (HOSPITAL_BASED_OUTPATIENT_CLINIC_OR_DEPARTMENT_OTHER)
Admission: EM | Admit: 2022-06-25 | Discharge: 2022-06-25 | Disposition: A | Discharge status: Home / Self Care | Payer: BC Managed Care – PPO | Attending: Emergency Medicine | Admitting: Emergency Medicine

## 2022-06-25 ENCOUNTER — Other Ambulatory Visit: Payer: Self-pay | Admitting: Family Medicine

## 2022-06-25 DIAGNOSIS — M436 Torticollis: Secondary | ICD-10-CM | POA: Diagnosis not present

## 2022-06-25 DIAGNOSIS — M542 Cervicalgia: Secondary | ICD-10-CM | POA: Diagnosis not present

## 2022-06-25 MED ORDER — METHYLPREDNISOLONE 4 MG PO TBPK: ORAL_TABLET | ORAL | 0 refills | Status: DC

## 2022-06-25 MED ORDER — DICLOFENAC SODIUM 1 % EX GEL: 4.0000 g | Freq: Four times a day (QID) | CUTANEOUS | 0 refills | Status: DC

## 2022-06-25 MED ORDER — OXYCODONE HCL 5 MG PO TABS
5.0000 mg | ORAL_TABLET | Freq: Once | ORAL | Status: AC
  Administered 2022-06-25: 5 mg via ORAL
  Filled 2022-06-25: qty 1

## 2022-06-25 MED ORDER — KETOROLAC TROMETHAMINE 15 MG/ML IJ SOLN
15.0000 mg | Freq: Once | INTRAMUSCULAR | Status: AC
  Administered 2022-06-25: 15 mg via INTRAMUSCULAR
  Filled 2022-06-25: qty 1

## 2022-06-25 MED ORDER — ACETAMINOPHEN 500 MG PO TABS
1000.0000 mg | ORAL_TABLET | Freq: Once | ORAL | Status: AC
  Administered 2022-06-25: 1000 mg via ORAL
  Filled 2022-06-25: qty 2

## 2022-06-25 MED ORDER — DIAZEPAM 2 MG PO TABS
2.0000 mg | ORAL_TABLET | Freq: Once | ORAL | Status: AC
  Administered 2022-06-25: 2 mg via ORAL
  Filled 2022-06-25: qty 1

## 2022-06-25 NOTE — Discharge Instructions (Signed)
Take the steroids as prescribed.  Use the gel as prescribed. Also take tylenol '1000mg'$ (2 extra strength) four times a day.   Please follow-up with your family doctor in the office.  Please let them know that you had worsening of your symptoms and they may decide to have imaging performed to refer you to a spinal doctor.  Please return to the emergency department if you develop numbness or weakness to your arms.

## 2022-06-25 NOTE — ED Provider Notes (Signed)
[facility] EMERGENCY DEPT Provider Note   CSN: [id number] Arrival date & time: 06/25/22  4037     History  Chief Complaint  Patient presents with   Neck Pain    Martha [name] is a 61 y.o. female.  61 yo F with a chief complaints of neck pain.  The patient unfortunately has been struggling with this for some time.  She has been seen in the ER as well as been seen by her family doctor.  She has been started on physical therapy and has been seen by massage therapy.  Things had been doing better and she was putting her laundry away and suddenly felt a pop and sudden spasm of her neck.  She denies radiation to the arms.  Denies numbness or weakness to the arms.  Denies overt trauma.   Neck Pain      Home Medications Prior to Admission medications   Medication Sig Start Date End Date Taking? Authorizing Provider  diclofenac Sodium (VOLTAREN) 1 % GEL Apply 4 g topically 4 (four) times daily. 06/25/22  Yes Deno Etienne, DO  methylPREDNISolone (MEDROL DOSEPAK) 4 MG TBPK tablet Day 1: '8mg'$  before breakfast, 4 mg after lunch, 4 mg after supper, and 8 mg at bedtime Day 2: 4 mg before breakfast, 4 mg after lunch, 4 mg  after supper, and 8 mg  at bedtime Day 3:  4 mg  before breakfast, 4 mg  after lunch, 4 mg after supper, and 4 mg  at bedtime Day 4: 4 mg  before breakfast, 4 mg  after lunch, and 4 mg at bedtime Day 5: 4 mg  before breakfast and 4 mg at bedtime Day 6: 4 mg  before breakfast 06/25/22  Yes Deno Etienne, DO  citalopram (CELEXA) 20 MG tablet TAKE 1 TABLET BY MOUTH EVERY DAY 01/12/22   [name], MD  cyclobenzaprine (FLEXERIL) 10 MG tablet Take 1 tablet (10 mg total) by mouth 3 (three) times daily as needed for muscle spasms. 06/16/22   Loralee Pacas, MD  letrozole [facility]) 2.5 MG tablet TAKE 1 TABLET BY MOUTH EVERY DAY 08/29/21   Truitt Merle, MD  levothyroxine (SYNTHROID) 75 MCG tablet TAKE 1 TABLET BY MOUTH DAILY BEFORE BREAKFAST. 01/12/22   [name], MD   oseltamivir (TAMIFLU) 75 MG capsule Take 1 capsule (75 mg total) by mouth 2 (two) times daily. 06/16/22   Loralee Pacas, MD  OVER THE COUNTER MEDICATION Take 1 tablet by mouth daily. Protandim Supplement    [provider]  Probiotic Product (PROBIOTIC PO) Take 1 capsule by mouth daily.    [provider]      Allergies    Morphine and related    Review of Systems   Review of Systems  Musculoskeletal:  Positive for neck pain.    Physical Exam Updated Vital Signs BP 137/72   Pulse 93   Temp 98.2 F (36.8 C)   Resp 19   Ht '5\' 2"'$  (1.575 m)   Wt 48.5 kg   SpO2 100%   BMI 19.57 kg/m  Physical Exam Vitals and nursing note reviewed.  Constitutional:      General: She is not in acute distress.    Appearance: She is well-developed. She is not diaphoretic.  HENT:     Head: Normocephalic and atraumatic.  Eyes:     Pupils: Pupils are equal, round, and reactive to light.  Cardiovascular:     Rate and Rhythm: Normal rate and regular  rhythm.     Heart sounds: No murmur heard.    No friction rub. No gallop.  Pulmonary:     Effort: Pulmonary effort is normal.     Breath sounds: No wheezing or rales.  Abdominal:     General: There is no distension.     Palpations: Abdomen is soft.     Tenderness: There is no abdominal tenderness.  Musculoskeletal:        General: No tenderness.     Cervical back: Normal range of motion and neck supple.     Comments: Pulse motor and sensation intact to bilateral upper extremities.  No appreciable weakness.  No obvious midline spinal tenderness step-offs or deformities.  She has some pain and spasm bilaterally along the paraspinal musculature.  Skin:    General: Skin is warm and dry.  Neurological:     Mental Status: She is alert and oriented to person, place, and time.  Psychiatric:        Behavior: Behavior normal.     ED Results / Procedures / Treatments   Labs (all labs ordered are listed, but only abnormal results  are displayed) Labs Reviewed - No data to display  EKG None  Radiology No results found.  Procedures Procedures    Medications Ordered in ED Medications  ketorolac (TORADOL) 15 MG/ML injection 15 mg (has no administration in time range)  acetaminophen (TYLENOL) tablet 1,000 mg (has no administration in time range)  oxyCODONE (Oxy IR/ROXICODONE) immediate release tablet 5 mg (has no administration in time range)  diazepam (VALIUM) tablet 2 mg (has no administration in time range)    ED Course/ Medical Decision Making/ A&P                           Medical Decision Making Risk OTC drugs. Prescription drug management.   11 yoF with a chief complaint of neck pain.  Unfortunately this is an ongoing issue for her.  Has been seen by the PCP as well as seen in the emergency department previously.  She has no radiation of pain has no appreciable weakness.  We will continue to treat supportively.  Given PCP follow-up.  10:33 AM:  I have discussed the diagnosis/risks/treatment options with the patient.  Evaluation and diagnostic testing in the emergency department does not suggest an emergent condition requiring admission or immediate intervention beyond what has been performed at this time.  They will follow up with PCP. We also discussed returning to the ED immediately if new or worsening sx occur. We discussed the sx which are most concerning (e.g., sudden worsening pain, fever, inability to tolerate by mouth) that necessitate immediate return. Medications administered to the patient during their visit and any new prescriptions provided to the patient are listed below.  Medications given during this visit Medications  ketorolac (TORADOL) 15 MG/ML injection 15 mg (has no administration in time range)  acetaminophen (TYLENOL) tablet 1,000 mg (has no administration in time range)  oxyCODONE (Oxy IR/ROXICODONE) immediate release tablet 5 mg (has no administration in time range)  diazepam  (VALIUM) tablet 2 mg (has no administration in time range)     The patient appears reasonably screen and/or stabilized for discharge and I doubt any other medical condition or other [facility] requiring further screening, evaluation, or treatment in the ED at this time prior to discharge.          Final Clinical Impression(s) / ED Diagnoses Final diagnoses:  Torticollis  Rx / DC Orders ED Discharge Orders          Ordered    methylPREDNISolone (MEDROL DOSEPAK) 4 MG TBPK tablet        06/25/22 1028    diclofenac Sodium (VOLTAREN) 1 % GEL  4 times daily        06/25/22 [place], [place], DO 06/25/22 1033

## 2022-06-25 NOTE — ED Triage Notes (Signed)
Pt states she has been dealing with cervical lordosis for the past few months. Has been doing well with PT, head, and dry needle. She states she was doing laundry yesterday and felt a bunch of cracking and popping in her neck. Pt denies any loss of control of bowel or bladder, denies numbness or tingling in extremities. Medication at home has not helped with the pain.

## 2022-06-27 ENCOUNTER — Encounter (HOSPITAL_BASED_OUTPATIENT_CLINIC_OR_DEPARTMENT_OTHER): Payer: BC Managed Care – PPO | Admitting: Physical Therapy

## 2022-06-27 ENCOUNTER — Encounter (HOSPITAL_BASED_OUTPATIENT_CLINIC_OR_DEPARTMENT_OTHER): Payer: Self-pay | Admitting: Physical Therapy

## 2022-06-27 ENCOUNTER — Ambulatory Visit (HOSPITAL_BASED_OUTPATIENT_CLINIC_OR_DEPARTMENT_OTHER): Payer: BC Managed Care – PPO | Admitting: Physical Therapy

## 2022-06-27 DIAGNOSIS — M542 Cervicalgia: Secondary | ICD-10-CM | POA: Diagnosis not present

## 2022-06-27 NOTE — Therapy (Signed)
OUTPATIENT PHYSICAL THERAPY CERVICAL EVALUATION   Patient Name: Martha [name] MRN: [id number] DOB:01-26-1961, 61 y.o., female Today's Date: 06/27/2022   PT End of Session - 06/27/22 1459     Visit Number 6    Number of Visits 12    Date for PT Re-Evaluation 07/10/22    Authorization Type BCBS    PT Start Time 1427    PT Stop Time 4098    PT Time Calculation (min) 20 min    Activity Tolerance Patient tolerated treatment well    Behavior During Therapy [facility] for tasks assessed/performed               Past Medical History:  Diagnosis Date   Anxiety    Cancer ([place]) 2022   right breast LCIS   Cancer ([place]) 2019   left breast   Depression    Dysrhythmia    SVT, s/p ablation ~ 2012 in at [facility]   History of radiation therapy 04/22/18-06/03/18   Left Breast, left SCV, axilla 50 Gy in 25 fractions, Left breast boost 10 Gy in 5 fractions.    Hypothyroidism    Personal history of radiation therapy    PONV (postoperative nausea and vomiting)    Thyroid disease    Past Surgical History:  Procedure Laterality Date   APPENDECTOMY     BREAST BIOPSY Right 2014   fibroadenoma   BREAST BIOPSY Left 01/16/2018   BREAST BIOPSY Right 02/02/2020   LCIS   BREAST BIOPSY Right 08/04/2020   x2 LCIS   BREAST BIOPSY Right 08/13/2020   x2   BREAST EXCISIONAL BIOPSY Left 1990   benign   BREAST EXCISIONAL BIOPSY Right 02/02/2020   LCIS   BREAST LUMPECTOMY Left 01/22/2018   BREAST LUMPECTOMY WITH AXILLARY LYMPH NODE BIOPSY Left 02/21/2018   Procedure: LEFT BREAST LUMPECTOMY WITH AXILLARY LYMPH NODE BIOPSY;  Surgeon: [name], MD;  Location: [place];  Service: General;  Laterality: Left;   BREAST LUMPECTOMY WITH RADIOACTIVE SEED LOCALIZATION Right 03/18/2020   Procedure: RIGHT BREAST LUMPECTOMY WITH RADIOACTIVE SEED LOCALIZATION;  Surgeon: [name], MD;  Location: [place];  Service: General;  Laterality: Right;   BREAST SURGERY     ENDOMETRIAL ABLATION      EYE SURGERY     GLAUCOMA SURGERY Bilateral    RADIOACTIVE SEED GUIDED EXCISIONAL BREAST BIOPSY Right 04/28/2021   Procedure: RADIOACTIVE SEED GUIDED EXCISIONAL RIGHT BREAST BIOPSY X2;  Surgeon: [name], MD;  Location: [place];  Service: General;  Laterality: Right;   RE-EXCISION OF BREAST LUMPECTOMY Left 03/20/2018   Procedure: RE-EXCISION OF BREAST LUMPECTOMY;  Surgeon: [name], MD;  Location: [place];  Service: General;  Laterality: Left;   Patient Active Problem List   Diagnosis Date Noted   Cervicalgia 06/16/2022   Stress 05/04/2022   Hyperglycemia 04/14/2021   Lobular carcinoma in situ (LCIS) of right breast 02/04/2020   Malignant neoplasm of lower-inner quadrant of left breast in female, estrogen receptor positive ([place]) 01/22/2018   Hypothyroidism 04/04/2017   Nicotine dependence with current use 04/04/2017   Excessive drinking of alcohol 04/04/2017   Menopausal symptom 04/04/2017     REFERRING PROVIDER: [name], MD  REFERRING DIAG: M54.2 (ICD-10-CM) - Neck pain  THERAPY DIAG:  Cervicalgia  Stiffness of unspecified joint, not elsewhere classified  Rationale for Evaluation and Treatment Rehabilitation  ONSET DATE: July 2023  SUBJECTIVE:  SUBJECTIVE STATEMENT: Considered cancelling, unsure if she really wants to do anything. Was throwing clothes in the laundry and saw colors and stars. Went to ED and am very stiff now. Felt like I was doing so good!  PERTINENT HISTORY:  Hx of Breast CA  PAIN:  NPRS:  Current:  8/10 Location:  R sided cervical paraspinals, R post aspect of head, UT, medial scap, and R post shoulder Type:  dull, aching, sharp, throbbing; constant  PRECAUTIONS: None  WEIGHT BEARING RESTRICTIONS  No  FALLS:  Has patient fallen in last 6 months? No  LIVING ENVIRONMENT: Lives with: husband.  Mother lives with her Lives in: 1 story home Stairs: 1 step in home, 3/4 steps in front/back and has rails   OCCUPATION: Pt was working in [employer], but has stopped working now due to taking care of mother.   PLOF: Independent; Pt was able to turn head, drive and perform her daily activities without significant pain or difficulty.   PATIENT GOALS improve flexibility, reduce stress, improve pain  OBJECTIVE:   DIAGNOSTIC FINDINGS:  Cervical X rays on 05/04/22:  FINDINGS: Straightening of the expected cervical lordosis. No significant spondylolisthesis.   Vertebral body height is maintained. No fracture is identified.   Disc space narrowing is greatest at C5-C6 and C6-C7 (moderate to moderately advanced at these levels). Multilevel anterior and posterior osteophytes, and uncovertebral hypertrophy, also greatest at C5-C6 and C6-C7. Facet arthrosis at C6-C7 (mild) and C7-T1 (mild-to-moderate). Multilevel neural foraminal narrowing, greatest bilaterally at C4-C5, C5-C6 and C6-C7.   IMPRESSION: Cervical spondylosis, as described.   Nonspecific straightening of the expected cervical lordosis  PATIENT SURVEYS:  FOTO 38 with a goal of 57 at visit #12  SENSATION: LT:  2+ t/o bilat dermatomes  POSTURE:  FHP  PALPATION: TTP with R > L UT.  tightness in bilat UT with muscle spasms in bilat worse on R.     CERVICAL ROM:   Active ROM A/PROM (deg) eval  Flexion WNL with pain  Extension 38 deg with pain  Right lateral flexion 24 with pain  Left lateral flexion 15 with pain  Right rotation 30 with pain  Left rotation 50 with pain   (Blank rows = not tested)  UPPER EXTREMITY ROM:  Active ROM Right eval Left eval  Shoulder flexion WNL WNL  Shoulder extension    Shoulder abduction WNL WNL  Shoulder adduction    Shoulder extension    Shoulder internal rotation     Shoulder external rotation    Elbow flexion    Elbow extension    Wrist flexion    Wrist extension    Wrist ulnar deviation    Wrist radial deviation    Wrist pronation    Wrist supination     (Blank rows = not tested)  UPPER EXTREMITY MMT:  MMT Right eval Left eval  Shoulder flexion 5/5 5/5  Shoulder extension    Shoulder abduction 5/5 5/5  Shoulder adduction    Shoulder extension    Shoulder internal rotation    Shoulder external rotation    Middle trapezius    Lower trapezius    Elbow flexion 5/5 5/5  Elbow extension    Wrist flexion    Wrist extension    Wrist ulnar deviation    Wrist radial deviation    Wrist pronation    Wrist supination    Grip strength     (Blank rows = not tested)  CERVICAL SPECIAL TESTS:  Compression Test:  pain in R sided cervical paraspinals Spurling's Test:  R:  pain in R side of head, L:  negative   TODAY'S TREATMENT:   Treatment                            06/27/22:  Manual STM to Rt upper trap, levator, supraspinatus Seated breathing with rib cage expansion Discussed use of home tools for pain management   Treatment                            06/22/22: Trigger Point Dry Needling, Manual Therapy Treatment:  Did patient give consent to treatment with Trigger Point Dry Needling: Yes E-Stim: No TPDN with skilled palpation and monitoring followed by STM to the following muscles: bil cervical paraspinals C3-5 Manual traction+rotattion+ rotation mobs of lower cervical spine Supine PA mobs and uni mobs to C2-C4 Seated chin tuck  Seated chin tuck with end range flexion  SNAGS Bilat   Treatment                            06/15/22: Trigger Point Dry Needling, Manual Therapy Treatment:  Initial or subsequent education regarding Trigger Point Dry Needling: Initial Did patient give consent to treatment with Trigger Point Dry Needling: Yes E-Stim: 19mu, mod intensity to L UT only TPDN with skilled palpation and monitoring followed  by STM to the following muscles: bil upper traps, bil cervical paraspinals C3-5, R SCM, R RCP major/ minor Manual traction+rotattion+ rotation mobs of lower cervical spine Supine C0 on 1 AP mobs in retraction+Lt sidebend  Supine chin tuck+rotation   PATIENT EDUCATION:  Education details: HEP, POC, dx, relevant anatomy, rationale of exercises, and objective findings.  PT answered Pt's questions.  Person educated: Patient Education method: Explanation, Demonstration, Tactile cues, Verbal cues, and Handouts Education comprehension: verbalized understanding, returned demonstration, verbal cues required, tactile cues required, and needs further education   HOME EXERCISE PROGRAM: Access Code: [email or web address]: [email or web address]  ASSESSMENT:  CLINICAL IMPRESSION: Able to reduce excessive tension through levator scap today. Will return to mobility focus at next visit and we discussed the incorporation of strength and stability into sessions to improve tolerance to activities as motion improves.    OBJECTIVE IMPAIRMENTS decreased ROM, hypomobility, increased fascial restrictions, increased muscle spasms, impaired flexibility, postural dysfunction, and pain.   ACTIVITY LIMITATIONS sleeping  PARTICIPATION LIMITATIONS: driving  PERSONAL FACTORS none  REHAB POTENTIAL: Good  CLINICAL DECISION MAKING: Stable/uncomplicated  EVALUATION COMPLEXITY: Low   GOALS:  SHORT TERM GOALS: Target date: 06/19/2022  Pt will be independent and compliant with HEP for improved pain, ROM, strength, and function.  Baseline:  Goal status: INITIAL  2.  Pt will demo a 10 deg increase in R cervical rotations and L cervical Sb'ing for improved stiffness and mobility. Baseline:  Goal status: INITIAL  3.  Pt will report at least a 25% improvement in pain and sx's 1st thing in AM.  Baseline:  Goal status: INITIAL    LONG TERM GOALS: Target date: 07/10/2022  Pt will be able to sleep at  least 5/7 nights/wk without pain disturbing her. Baseline:  Goal status: INITIAL  2.  Pt will report being able to turn her head in the car while driving without significant pain or limitation.   Baseline:  Goal status: INITIAL  3.  Pt will report at least a 70% improvement  in pain and sx's overall.  Baseline:  Goal status: INITIAL  4.  Pt will have reduced pain with cervical AROM and demo improved rotation to 60 deg bilat and > 30 deg in Sb'ing bilat for improved stiffness and mobility.  Baseline:  Goal status: INITIAL     PLAN: PT FREQUENCY: 2x/week  PT DURATION: 6 weeks  PLANNED INTERVENTIONS: Therapeutic exercises, Therapeutic activity, Neuromuscular re-education, Patient/Family education, Self Care, Joint mobilization, Aquatic Therapy, Dry Needling, Electrical stimulation, Spinal mobilization, Cryotherapy, Moist heat, Taping, Ultrasound, Manual therapy, and Re-evaluation  PLAN FOR NEXT SESSION: continue DN and manual therapy. Deep neck flexor strengthening.   Luverne Zerkle C. Sharla Tankard PT, DPT 06/27/22 3:00 PM

## 2022-06-29 ENCOUNTER — Encounter (HOSPITAL_BASED_OUTPATIENT_CLINIC_OR_DEPARTMENT_OTHER): Payer: BC Managed Care – PPO | Admitting: Physical Therapy

## 2022-06-29 ENCOUNTER — Encounter (HOSPITAL_BASED_OUTPATIENT_CLINIC_OR_DEPARTMENT_OTHER): Payer: Self-pay | Admitting: Physical Therapy

## 2022-06-29 ENCOUNTER — Ambulatory Visit (HOSPITAL_BASED_OUTPATIENT_CLINIC_OR_DEPARTMENT_OTHER): Payer: BC Managed Care – PPO | Attending: Family Medicine | Admitting: Physical Therapy

## 2022-06-29 DIAGNOSIS — M542 Cervicalgia: Secondary | ICD-10-CM

## 2022-06-29 NOTE — Therapy (Signed)
OUTPATIENT PHYSICAL THERAPY CERVICAL EVALUATION   Patient Name: Martha [name] MRN: [id number] DOB:August 14, 1961, 61 y.o., female Today's Date: 06/29/2022   PT End of Session - 06/29/22 1147     Visit Number 7    Number of Visits 12    Date for PT Re-Evaluation 07/10/22    Authorization Type BCBS    PT Start Time 1443    PT Stop Time 1220    PT Time Calculation (min) 35 min    Activity Tolerance Patient tolerated treatment well    Behavior During Therapy [facility] for tasks assessed/performed               Past Medical History:  Diagnosis Date   Anxiety    Cancer ([place]) 2022   right breast LCIS   Cancer ([place]) 2019   left breast   Depression    Dysrhythmia    SVT, s/p ablation ~ 2012 in at [facility]   History of radiation therapy 04/22/18-06/03/18   Left Breast, left SCV, axilla 50 Gy in 25 fractions, Left breast boost 10 Gy in 5 fractions.    Hypothyroidism    Personal history of radiation therapy    PONV (postoperative nausea and vomiting)    Thyroid disease    Past Surgical History:  Procedure Laterality Date   APPENDECTOMY     BREAST BIOPSY Right 2014   fibroadenoma   BREAST BIOPSY Left 01/16/2018   BREAST BIOPSY Right 02/02/2020   LCIS   BREAST BIOPSY Right 08/04/2020   x2 LCIS   BREAST BIOPSY Right 08/13/2020   x2   BREAST EXCISIONAL BIOPSY Left 1990   benign   BREAST EXCISIONAL BIOPSY Right 02/02/2020   LCIS   BREAST LUMPECTOMY Left 01/22/2018   BREAST LUMPECTOMY WITH AXILLARY LYMPH NODE BIOPSY Left 02/21/2018   Procedure: LEFT BREAST LUMPECTOMY WITH AXILLARY LYMPH NODE BIOPSY;  Surgeon: [name], MD;  Location: [place];  Service: General;  Laterality: Left;   BREAST LUMPECTOMY WITH RADIOACTIVE SEED LOCALIZATION Right 03/18/2020   Procedure: RIGHT BREAST LUMPECTOMY WITH RADIOACTIVE SEED LOCALIZATION;  Surgeon: [name], MD;  Location: [place];  Service: General;  Laterality: Right;   BREAST SURGERY     ENDOMETRIAL ABLATION      EYE SURGERY     GLAUCOMA SURGERY Bilateral    RADIOACTIVE SEED GUIDED EXCISIONAL BREAST BIOPSY Right 04/28/2021   Procedure: RADIOACTIVE SEED GUIDED EXCISIONAL RIGHT BREAST BIOPSY X2;  Surgeon: [name], MD;  Location: [place];  Service: General;  Laterality: Right;   RE-EXCISION OF BREAST LUMPECTOMY Left 03/20/2018   Procedure: RE-EXCISION OF BREAST LUMPECTOMY;  Surgeon: [name], MD;  Location: [place];  Service: General;  Laterality: Left;   Patient Active Problem List   Diagnosis Date Noted   Cervicalgia 06/16/2022   Stress 05/04/2022   Hyperglycemia 04/14/2021   Lobular carcinoma in situ (LCIS) of right breast 02/04/2020   Malignant neoplasm of lower-inner quadrant of left breast in female, estrogen receptor positive ([place]) 01/22/2018   Hypothyroidism 04/04/2017   Nicotine dependence with current use 04/04/2017   Excessive drinking of alcohol 04/04/2017   Menopausal symptom 04/04/2017     REFERRING PROVIDER: [name], MD  REFERRING DIAG: M54.2 (ICD-10-CM) - Neck pain  THERAPY DIAG:  Cervicalgia  Stiffness of unspecified joint, not elsewhere classified  Rationale for Evaluation and Treatment Rehabilitation  ONSET DATE: July 2023  SUBJECTIVE:  SUBJECTIVE STATEMENT: It is a little better, hurts here (rubbing along Rt SCM).  PERTINENT HISTORY:  Hx of Breast CA  PAIN:  NPRS:  Current:  8/10 Location:  R sided cervical paraspinals, R post aspect of head, UT, medial scap, and R post shoulder Type:  dull, aching, sharp, throbbing; constant  PRECAUTIONS: None  WEIGHT BEARING RESTRICTIONS No  FALLS:  Has patient fallen in last 6 months? No  LIVING ENVIRONMENT: Lives with: husband.  Mother lives with her Lives in: 1 story  home Stairs: 1 step in home, 3/4 steps in front/back and has rails   OCCUPATION: Pt was working in [employer], but has stopped working now due to taking care of mother.   PLOF: Independent; Pt was able to turn head, drive and perform her daily activities without significant pain or difficulty.   PATIENT GOALS improve flexibility, reduce stress, improve pain  OBJECTIVE:   DIAGNOSTIC FINDINGS:  Cervical X rays on 05/04/22:  FINDINGS: Straightening of the expected cervical lordosis. No significant spondylolisthesis.   Vertebral body height is maintained. No fracture is identified.   Disc space narrowing is greatest at C5-C6 and C6-C7 (moderate to moderately advanced at these levels). Multilevel anterior and posterior osteophytes, and uncovertebral hypertrophy, also greatest at C5-C6 and C6-C7. Facet arthrosis at C6-C7 (mild) and C7-T1 (mild-to-moderate). Multilevel neural foraminal narrowing, greatest bilaterally at C4-C5, C5-C6 and C6-C7.   IMPRESSION: Cervical spondylosis, as described.   Nonspecific straightening of the expected cervical lordosis  PATIENT SURVEYS:  FOTO 38 with a goal of 57 at visit #12  SENSATION: LT:  2+ t/o bilat dermatomes  POSTURE:  FHP  PALPATION: TTP with R > L UT.  tightness in bilat UT with muscle spasms in bilat worse on R.     CERVICAL ROM:   Active ROM A/PROM (deg) eval  Flexion WNL with pain  Extension 38 deg with pain  Right lateral flexion 24 with pain  Left lateral flexion 15 with pain  Right rotation 30 with pain  Left rotation 50 with pain   (Blank rows = not tested)  UPPER EXTREMITY ROM:  Active ROM Right eval Left eval  Shoulder flexion WNL WNL  Shoulder extension    Shoulder abduction WNL WNL  Shoulder adduction    Shoulder extension    Shoulder internal rotation    Shoulder external rotation    Elbow flexion    Elbow extension    Wrist flexion    Wrist extension    Wrist ulnar deviation    Wrist  radial deviation    Wrist pronation    Wrist supination     (Blank rows = not tested)  UPPER EXTREMITY MMT:  MMT Right eval Left eval  Shoulder flexion 5/5 5/5  Shoulder extension    Shoulder abduction 5/5 5/5  Shoulder adduction    Shoulder extension    Shoulder internal rotation    Shoulder external rotation    Middle trapezius    Lower trapezius    Elbow flexion 5/5 5/5  Elbow extension    Wrist flexion    Wrist extension    Wrist ulnar deviation    Wrist radial deviation    Wrist pronation    Wrist supination    Grip strength     (Blank rows = not tested)  CERVICAL SPECIAL TESTS:  Compression Test:  pain in R sided cervical paraspinals Spurling's Test:  R:  pain in R side of head, L:  negative   TODAY'S TREATMENT:  Treatment                            06/29/22:  Trigger Point Dry Needling, Manual Therapy Treatment:  Initial or subsequent education regarding Trigger Point Dry Needling: Subsequent Did patient give consent to treatment with Trigger Point Dry Needling: Yes TPDN with skilled palpation and monitoring followed by STM to the following muscles: Rt SCM both bellies, Rt scalenes, Rt upper trap  Supine cervical rotation Supine cervical retraction Supine ER yellow tband Supine horiz abd yellow tband Supine flexion stretch with holding tension on band   Treatment                            06/27/22:  Manual STM to Rt upper trap, levator, supraspinatus Seated breathing with rib cage expansion Discussed use of home tools for pain management   Treatment                            06/22/22: Trigger Point Dry Needling, Manual Therapy Treatment:  Did patient give consent to treatment with Trigger Point Dry Needling: Yes E-Stim: No TPDN with skilled palpation and monitoring followed by STM to the following muscles: bil cervical paraspinals C3-5 Manual traction+rotattion+ rotation mobs of lower cervical spine Supine PA mobs and uni mobs to C2-C4 Seated  chin tuck  Seated chin tuck with end range flexion  SNAGS Bilat     PATIENT EDUCATION:  Education details: HEP, POC, dx, relevant anatomy, rationale of exercises, and objective findings.  PT answered Pt's questions.  Person educated: Patient Education method: Explanation, Demonstration, Tactile cues, Verbal cues, and Handouts Education comprehension: verbalized understanding, returned demonstration, verbal cues required, tactile cues required, and needs further education   HOME EXERCISE PROGRAM: Access Code: [email or web address] URL: [email or web address]  ASSESSMENT:  CLINICAL IMPRESSION: Able to reduce spasm through right side of cervical spine and pt reported feeling better. Exercises in supine to rest cervical region and activate ant neck and periscap musculature. Still has a lot of pain to get up from supine.   OBJECTIVE IMPAIRMENTS decreased ROM, hypomobility, increased fascial restrictions, increased muscle spasms, impaired flexibility, postural dysfunction, and pain.   ACTIVITY LIMITATIONS sleeping  PARTICIPATION LIMITATIONS: driving  PERSONAL FACTORS none  REHAB POTENTIAL: Good  CLINICAL DECISION MAKING: Stable/uncomplicated  EVALUATION COMPLEXITY: Low   GOALS:  SHORT TERM GOALS: Target date: 06/19/2022  Pt will be independent and compliant with HEP for improved pain, ROM, strength, and function.  Baseline:  Goal status: achieved for short term HEP  2.  Pt will demo a 10 deg increase in R cervical rotations and L cervical Sb'ing for improved stiffness and mobility. Baseline:  Goal status: ongoing due to recent set back  3.  Pt will report at least a 25% improvement in pain and sx's 1st thing in AM.  Baseline:  Goal status: ongoing due to recent set back, prior to the incident this was met.     LONG TERM GOALS: Target date: 07/10/2022  Pt will be able to sleep at least 5/7 nights/wk without pain disturbing her. Baseline:  Goal status:  INITIAL  2.  Pt will report being able to turn her head in the car while driving without significant pain or limitation.   Baseline:  Goal status: INITIAL  3.  Pt will report at least a 70% improvement in pain and sx's overall.  Baseline:  Goal status: INITIAL  4.  Pt will have reduced pain with cervical AROM and demo improved rotation to 60 deg bilat and > 30 deg in Sb'ing bilat for improved stiffness and mobility.  Baseline:  Goal status: INITIAL     PLAN: PT FREQUENCY: 2x/week  PT DURATION: 6 weeks  PLANNED INTERVENTIONS: Therapeutic exercises, Therapeutic activity, Neuromuscular re-education, Patient/Family education, Self Care, Joint mobilization, Aquatic Therapy, Dry Needling, Electrical stimulation, Spinal mobilization, Cryotherapy, Moist heat, Taping, Ultrasound, Manual therapy, and Re-evaluation  PLAN FOR NEXT SESSION: continue DN and manual therapy. Deep neck flexor strengthening.   Antero Derosia C. Gilberta Peeters PT, DPT 06/29/22 12:35 PM

## 2022-06-30 NOTE — Therapy (Deleted)
OUTPATIENT PHYSICAL THERAPY CERVICAL EVALUATION   Patient Name: Martha [name] MRN: [id number] DOB:[birth year], 61 y.o., female Today's Date: 06/30/2022       Past Medical History:  Diagnosis Date   Anxiety    Cancer [facility]) 2022   right breast LCIS   Cancer ([place]) 2019   left breast   Depression    Dysrhythmia    SVT, s/p ablation ~ 2012 in at [facility]   History of radiation therapy 04/22/18-06/03/18   Left Breast, left SCV, axilla 50 Gy in 25 fractions, Left breast boost 10 Gy in 5 fractions.    Hypothyroidism    Personal history of radiation therapy    PONV (postoperative nausea and vomiting)    Thyroid disease    Past Surgical History:  Procedure Laterality Date   APPENDECTOMY     BREAST BIOPSY Right 2014   fibroadenoma   BREAST BIOPSY Left 01/16/2018   BREAST BIOPSY Right 02/02/2020   LCIS   BREAST BIOPSY Right 08/04/2020   x2 LCIS   BREAST BIOPSY Right 08/13/2020   x2   BREAST EXCISIONAL BIOPSY Left 1990   benign   BREAST EXCISIONAL BIOPSY Right 02/02/2020   LCIS   BREAST LUMPECTOMY Left 01/22/2018   BREAST LUMPECTOMY WITH AXILLARY LYMPH NODE BIOPSY Left 02/21/2018   Procedure: LEFT BREAST LUMPECTOMY WITH AXILLARY LYMPH NODE BIOPSY;  Surgeon: [name], MD;  Location: [place];  Service: General;  Laterality: Left;   BREAST LUMPECTOMY WITH RADIOACTIVE SEED LOCALIZATION Right 03/18/2020   Procedure: RIGHT BREAST LUMPECTOMY WITH RADIOACTIVE SEED LOCALIZATION;  Surgeon: [name], MD;  Location: [place];  Service: General;  Laterality: Right;   BREAST SURGERY     ENDOMETRIAL ABLATION     EYE SURGERY     GLAUCOMA SURGERY Bilateral    RADIOACTIVE SEED GUIDED EXCISIONAL BREAST BIOPSY Right 04/28/2021   Procedure: RADIOACTIVE SEED GUIDED EXCISIONAL RIGHT BREAST BIOPSY X2;  Surgeon: [name], MD;  Location: [place];  Service: General;  Laterality: Right;   RE-EXCISION OF BREAST LUMPECTOMY Left 03/20/2018   Procedure:  RE-EXCISION OF BREAST LUMPECTOMY;  Surgeon: [name], MD;  Location: [place];  Service: General;  Laterality: Left;   Patient Active Problem List   Diagnosis Date Noted   Cervicalgia 06/16/2022   Stress 05/04/2022   Hyperglycemia 04/14/2021   Lobular carcinoma in situ (LCIS) of right breast 02/04/2020   Malignant neoplasm of lower-inner quadrant of left breast in female, estrogen receptor positive ([place]) 01/22/2018   Hypothyroidism 04/04/2017   Nicotine dependence with current use 04/04/2017   Excessive drinking of alcohol 04/04/2017   Menopausal symptom 04/04/2017     REFERRING PROVIDER: [name], MD  REFERRING DIAG: M54.2 (ICD-10-CM) - Neck pain  THERAPY DIAG:  No diagnosis found.  Stiffness of unspecified joint, not elsewhere classified  Rationale for Evaluation and Treatment Rehabilitation  ONSET DATE: July 2023  SUBJECTIVE:  SUBJECTIVE STATEMENT: It is a little better, hurts here (rubbing along Rt SCM).  PERTINENT HISTORY:  Hx of Breast CA  PAIN:  NPRS:  Current:  8/10 Location:  R sided cervical paraspinals, R post aspect of head, UT, medial scap, and R post shoulder Type:  dull, aching, sharp, throbbing; constant  PRECAUTIONS: None  WEIGHT BEARING RESTRICTIONS No  FALLS:  Has patient fallen in last 6 months? No  LIVING ENVIRONMENT: Lives with: husband.  Mother lives with her Lives in: 1 story home Stairs: 1 step in home, 3/4 steps in front/back and has rails   OCCUPATION: Pt was working in [employer], but has stopped working now due to taking care of mother.   PLOF: Independent; Pt was able to turn head, drive and perform her daily activities without significant pain or difficulty.   PATIENT GOALS improve flexibility,  reduce stress, improve pain  OBJECTIVE:   DIAGNOSTIC FINDINGS:  Cervical X rays on 05/04/22:  FINDINGS: Straightening of the expected cervical lordosis. No significant spondylolisthesis.   Vertebral body height is maintained. No fracture is identified.   Disc space narrowing is greatest at C5-C6 and C6-C7 (moderate to moderately advanced at these levels). Multilevel anterior and posterior osteophytes, and uncovertebral hypertrophy, also greatest at C5-C6 and C6-C7. Facet arthrosis at C6-C7 (mild) and C7-T1 (mild-to-moderate). Multilevel neural foraminal narrowing, greatest bilaterally at C4-C5, C5-C6 and C6-C7.   IMPRESSION: Cervical spondylosis, as described.   Nonspecific straightening of the expected cervical lordosis  PATIENT SURVEYS:  FOTO 38 with a goal of 57 at visit #12  SENSATION: LT:  2+ t/o bilat dermatomes  POSTURE:  FHP  PALPATION: TTP with R > L UT.  tightness in bilat UT with muscle spasms in bilat worse on R.     CERVICAL ROM:   Active ROM A/PROM (deg) eval  Flexion WNL with pain  Extension 38 deg with pain  Right lateral flexion 24 with pain  Left lateral flexion 15 with pain  Right rotation 30 with pain  Left rotation 50 with pain   (Blank rows = not tested)  UPPER EXTREMITY ROM:  Active ROM Right eval Left eval  Shoulder flexion WNL WNL  Shoulder extension    Shoulder abduction WNL WNL  Shoulder adduction    Shoulder extension    Shoulder internal rotation    Shoulder external rotation    Elbow flexion    Elbow extension    Wrist flexion    Wrist extension    Wrist ulnar deviation    Wrist radial deviation    Wrist pronation    Wrist supination     (Blank rows = not tested)  UPPER EXTREMITY MMT:  MMT Right eval Left eval  Shoulder flexion 5/5 5/5  Shoulder extension    Shoulder abduction 5/5 5/5  Shoulder adduction    Shoulder extension    Shoulder internal rotation    Shoulder external rotation    Middle trapezius     Lower trapezius    Elbow flexion 5/5 5/5  Elbow extension    Wrist flexion    Wrist extension    Wrist ulnar deviation    Wrist radial deviation    Wrist pronation    Wrist supination    Grip strength     (Blank rows = not tested)  CERVICAL SPECIAL TESTS:  Compression Test:  pain in R sided cervical paraspinals Spurling's Test:  R:  pain in R side of head, L:  negative   TODAY'S TREATMENT:  Treatment                            06/29/22:  Trigger Point Dry Needling, Manual Therapy Treatment:  Initial or subsequent education regarding Trigger Point Dry Needling: Subsequent Did patient give consent to treatment with Trigger Point Dry Needling: Yes TPDN with skilled palpation and monitoring followed by STM to the following muscles: Rt SCM both bellies, Rt scalenes, Rt upper trap  Supine cervical rotation Supine cervical retraction Supine ER yellow tband Supine horiz abd yellow tband Supine flexion stretch with holding tension on band   Treatment                            06/27/22:  Manual STM to Rt upper trap, levator, supraspinatus Seated breathing with rib cage expansion Discussed use of home tools for pain management   Treatment                            06/22/22: Trigger Point Dry Needling, Manual Therapy Treatment:  Did patient give consent to treatment with Trigger Point Dry Needling: Yes E-Stim: No TPDN with skilled palpation and monitoring followed by STM to the following muscles: bil cervical paraspinals C3-5 Manual traction+rotattion+ rotation mobs of lower cervical spine Supine PA mobs and uni mobs to C2-C4 Seated chin tuck  Seated chin tuck with end range flexion  SNAGS Bilat     PATIENT EDUCATION:  Education details: HEP, POC, dx, relevant anatomy, rationale of exercises, and objective findings.  PT answered Pt's questions.  Person educated: Patient Education method: Explanation, Demonstration, Tactile cues, Verbal cues, and Handouts Education  comprehension: verbalized understanding, returned demonstration, verbal cues required, tactile cues required, and needs further education   HOME EXERCISE PROGRAM: Access Code: [email or web address] URL: [email or web address]  ASSESSMENT:  CLINICAL IMPRESSION: Able to reduce spasm through right side of cervical spine and pt reported feeling better. Exercises in supine to rest cervical region and activate ant neck and periscap musculature. Still has a lot of pain to get up from supine.   OBJECTIVE IMPAIRMENTS decreased ROM, hypomobility, increased fascial restrictions, increased muscle spasms, impaired flexibility, postural dysfunction, and pain.   ACTIVITY LIMITATIONS sleeping  PARTICIPATION LIMITATIONS: driving  PERSONAL FACTORS none  REHAB POTENTIAL: Good  CLINICAL DECISION MAKING: Stable/uncomplicated  EVALUATION COMPLEXITY: Low   GOALS:  SHORT TERM GOALS: Target date: 06/19/2022  Pt will be independent and compliant with HEP for improved pain, ROM, strength, and function.  Baseline:  Goal status: achieved for short term HEP  2.  Pt will demo a 10 deg increase in R cervical rotations and L cervical Sb'ing for improved stiffness and mobility. Baseline:  Goal status: ongoing due to recent set back  3.  Pt will report at least a 25% improvement in pain and sx's 1st thing in AM.  Baseline:  Goal status: ongoing due to recent set back, prior to the incident this was met.     LONG TERM GOALS: Target date: 07/10/2022  Pt will be able to sleep at least 5/7 nights/wk without pain disturbing her. Baseline:  Goal status: INITIAL  2.  Pt will report being able to turn her head in the car while driving without significant pain or limitation.   Baseline:  Goal status: INITIAL  3.  Pt will report at least a 70% improvement in pain and sx's overall.  Baseline:    Goal status: INITIAL  4.  Pt will have reduced pain with cervical AROM and demo improved rotation to 60 deg  bilat and > 30 deg in Sb'ing bilat for improved stiffness and mobility.  Baseline:  Goal status: INITIAL     PLAN: PT FREQUENCY: 2x/week  PT DURATION: 6 weeks  PLANNED INTERVENTIONS: Therapeutic exercises, Therapeutic activity, Neuromuscular re-education, Patient/Family education, Self Care, Joint mobilization, Aquatic Therapy, Dry Needling, Electrical stimulation, Spinal mobilization, Cryotherapy, Moist heat, Taping, Ultrasound, Manual therapy, and Re-evaluation  PLAN FOR NEXT SESSION: continue DN and manual therapy. Deep neck flexor strengthening.   Simone Yuds PT, DPT 06/30/22  10:27 AM     

## 2022-07-04 ENCOUNTER — Encounter (HOSPITAL_BASED_OUTPATIENT_CLINIC_OR_DEPARTMENT_OTHER): Payer: BC Managed Care – PPO | Admitting: Physical Therapy

## 2022-07-04 ENCOUNTER — Ambulatory Visit (HOSPITAL_BASED_OUTPATIENT_CLINIC_OR_DEPARTMENT_OTHER): Payer: BC Managed Care – PPO | Admitting: Physical Therapy

## 2022-07-06 ENCOUNTER — Encounter (HOSPITAL_BASED_OUTPATIENT_CLINIC_OR_DEPARTMENT_OTHER): Payer: BC Managed Care – PPO | Admitting: Physical Therapy

## 2022-07-07 ENCOUNTER — Encounter (HOSPITAL_BASED_OUTPATIENT_CLINIC_OR_DEPARTMENT_OTHER): Payer: Self-pay | Admitting: Physical Therapy

## 2022-07-07 ENCOUNTER — Ambulatory Visit (HOSPITAL_BASED_OUTPATIENT_CLINIC_OR_DEPARTMENT_OTHER): Payer: BC Managed Care – PPO | Admitting: Physical Therapy

## 2022-07-07 DIAGNOSIS — M542 Cervicalgia: Secondary | ICD-10-CM

## 2022-07-07 NOTE — Therapy (Signed)
OUTPATIENT PHYSICAL THERAPY CERVICAL EVALUATION   Patient Name: Martha [name] MRN: [id number] DOB:March 24, 1961, 61 y.o., female Today's Date: 07/07/2022   PT End of Session - 07/07/22 1151     Visit Number 8    Number of Visits 12    Date for PT Re-Evaluation 07/10/22    Authorization Type BC[employer]    PT Start Time 1150    PT Stop Time 4035    PT Time Calculation (min) 43 min    Activity Tolerance Patient tolerated treatment well    Behavior During Therapy [facility] for tasks assessed/performed                Past Medical History:  Diagnosis Date   Anxiety    Cancer ([place]) 2022   right breast LCIS   Cancer ([place]) 2019   left breast   Depression    Dysrhythmia    SVT, s/p ablation ~ 2012 in at [facility]   History of radiation therapy 04/22/18-06/03/18   Left Breast, left SCV, axilla 50 Gy in 25 fractions, Left breast boost 10 Gy in 5 fractions.    Hypothyroidism    Personal history of radiation therapy    PONV (postoperative nausea and vomiting)    Thyroid disease    Past Surgical History:  Procedure Laterality Date   APPENDECTOMY     BREAST BIOPSY Right 2014   fibroadenoma   BREAST BIOPSY Left 01/16/2018   BREAST BIOPSY Right 02/02/2020   LCIS   BREAST BIOPSY Right 08/04/2020   x2 LCIS   BREAST BIOPSY Right 08/13/2020   x2   BREAST EXCISIONAL BIOPSY Left 1990   benign   BREAST EXCISIONAL BIOPSY Right 02/02/2020   LCIS   BREAST LUMPECTOMY Left 01/22/2018   BREAST LUMPECTOMY WITH AXILLARY LYMPH NODE BIOPSY Left 02/21/2018   Procedure: LEFT BREAST LUMPECTOMY WITH AXILLARY LYMPH NODE BIOPSY;  Surgeon: [name], MD;  Location: [place];  Service: General;  Laterality: Left;   BREAST LUMPECTOMY WITH RADIOACTIVE SEED LOCALIZATION Right 03/18/2020   Procedure: RIGHT BREAST LUMPECTOMY WITH RADIOACTIVE SEED LOCALIZATION;  Surgeon: [name], MD;  Location: [place];  Service: General;  Laterality: Right;   BREAST SURGERY     ENDOMETRIAL ABLATION      EYE SURGERY     GLAUCOMA SURGERY Bilateral    RADIOACTIVE SEED GUIDED EXCISIONAL BREAST BIOPSY Right 04/28/2021   Procedure: RADIOACTIVE SEED GUIDED EXCISIONAL RIGHT BREAST BIOPSY X2;  Surgeon: [name], MD;  Location: [place];  Service: General;  Laterality: Right;   RE-EXCISION OF BREAST LUMPECTOMY Left 03/20/2018   Procedure: RE-EXCISION OF BREAST LUMPECTOMY;  Surgeon: [name], MD;  Location: [place];  Service: General;  Laterality: Left;   Patient Active Problem List   Diagnosis Date Noted   Cervicalgia 06/16/2022   Stress 05/04/2022   Hyperglycemia 04/14/2021   Lobular carcinoma in situ (LCIS) of right breast 02/04/2020   Malignant neoplasm of lower-inner quadrant of left breast in female, estrogen receptor positive ([place]) 01/22/2018   Hypothyroidism 04/04/2017   Nicotine dependence with current use 04/04/2017   Excessive drinking of alcohol 04/04/2017   Menopausal symptom 04/04/2017     REFERRING PROVIDER: [name], MD  REFERRING DIAG: M54.2 (ICD-10-CM) - Neck pain  THERAPY DIAG:  Cervicalgia  Stiffness of unspecified joint, not elsewhere classified  Rationale for Evaluation and Treatment Rehabilitation  ONSET DATE: July 2023  SUBJECTIVE:  SUBJECTIVE STATEMENT: Able to sleep flat and without heating pad. Rt SCM still uncomfortable.   PERTINENT HISTORY:  Hx of Breast CA  PAIN:  NPRS:  Current:  8/10 Location:  R sided cervical paraspinals, R post aspect of head, UT, medial scap, and R post shoulder Type:  dull, aching, sharp, throbbing; constant  PRECAUTIONS: None  WEIGHT BEARING RESTRICTIONS No  FALLS:  Has patient fallen in last 6 months? No  LIVING ENVIRONMENT: Lives with: husband.  Mother lives with  her Lives in: 1 story home Stairs: 1 step in home, 3/4 steps in front/back and has rails   OCCUPATION: Pt was working in [employer], but has stopped working now due to taking care of mother.   PLOF: Independent; Pt was able to turn head, drive and perform her daily activities without significant pain or difficulty.   PATIENT GOALS improve flexibility, reduce stress, improve pain  OBJECTIVE:   DIAGNOSTIC FINDINGS:  Cervical X rays on 05/04/22:  FINDINGS: Straightening of the expected cervical lordosis. No significant spondylolisthesis.   Vertebral body height is maintained. No fracture is identified.   Disc space narrowing is greatest at C5-C6 and C6-C7 (moderate to moderately advanced at these levels). Multilevel anterior and posterior osteophytes, and uncovertebral hypertrophy, also greatest at C5-C6 and C6-C7. Facet arthrosis at C6-C7 (mild) and C7-T1 (mild-to-moderate). Multilevel neural foraminal narrowing, greatest bilaterally at C4-C5, C5-C6 and C6-C7.   IMPRESSION: Cervical spondylosis, as described.   Nonspecific straightening of the expected cervical lordosis  PATIENT SURVEYS:  FOTO 38 with a goal of 57 at visit #12  SENSATION: LT:  2+ t/o bilat dermatomes  POSTURE:  FHP  PALPATION: TTP with R > L UT.  tightness in bilat UT with muscle spasms in bilat worse on R.     CERVICAL ROM:   Active ROM A/PROM (deg) eval AROM 11/10  Flexion WNL with pain   Extension 38 deg with pain   Right lateral flexion 24 with pain 20 no pain  Left lateral flexion 15 with pain 5 with Rt-side pain  Right rotation 30 with pain 10  Left rotation 50 with pain 30 with Rt-side pain   (Blank rows = not tested)  UPPER EXTREMITY ROM:  Active ROM Right eval Left eval  Shoulder flexion WNL WNL  Shoulder extension    Shoulder abduction WNL WNL  Shoulder adduction    Shoulder extension    Shoulder internal rotation    Shoulder external rotation    Elbow flexion     Elbow extension    Wrist flexion    Wrist extension    Wrist ulnar deviation    Wrist radial deviation    Wrist pronation    Wrist supination     (Blank rows = not tested)  UPPER EXTREMITY MMT:  MMT Right eval Left eval  Shoulder flexion 5/5 5/5  Shoulder extension    Shoulder abduction 5/5 5/5  Shoulder adduction    Shoulder extension    Shoulder internal rotation    Shoulder external rotation    Middle trapezius    Lower trapezius    Elbow flexion 5/5 5/5  Elbow extension    Wrist flexion    Wrist extension    Wrist ulnar deviation    Wrist radial deviation    Wrist pronation    Wrist supination    Grip strength     (Blank rows = not tested)  CERVICAL SPECIAL TESTS:  Compression Test:  pain in R sided cervical paraspinals Spurling's  Test:  R:  pain in R side of head, L:  negative   TODAY'S TREATMENT:   Treatment                            07/07/22:  Trigger Point Dry Needling, Manual Therapy Treatment:  Initial or subsequent education regarding Trigger Point Dry Needling: Subsequent Did patient give consent to treatment with Trigger Point Dry Needling: Yes TPDN with skilled palpation and monitoring followed by STM to the following muscles: Rt upper trap, scalenes, SCM  Cdrvical traction, STM to subcoccipitals, paraspinals & upper traps, right first rib mobilization   Treatment                            06/29/22:  Trigger Point Dry Needling, Manual Therapy Treatment:  Initial or subsequent education regarding Trigger Point Dry Needling: Subsequent Did patient give consent to treatment with Trigger Point Dry Needling: Yes TPDN with skilled palpation and monitoring followed by STM to the following muscles: Rt SCM both bellies, Rt scalenes, Rt upper trap  Supine cervical rotation Supine cervical retraction Supine ER yellow tband Supine horiz abd yellow tband Supine flexion stretch with holding tension on band   Treatment                             06/27/22:  Manual STM to Rt upper trap, levator, supraspinatus Seated breathing with rib cage expansion Discussed use of home tools for pain management    PATIENT EDUCATION:  Education details: HEP, POC, dx, relevant anatomy, rationale of exercises, and objective findings.  PT answered Pt's questions.  Person educated: Patient Education method: Explanation, Demonstration, Tactile cues, Verbal cues, and Handouts Education comprehension: verbalized understanding, returned demonstration, verbal cues required, tactile cues required, and needs further education   HOME EXERCISE PROGRAM: Access Code: [email or web address] URL: [email or web address]  ASSESSMENT:  CLINICAL IMPRESSION: Decrease in pain today with DN and manual therapy but ROM is still very limited. Lower cervical/upper thoracic dextroscoliotic curve that is not obvious upon postural assessment today but makes sense given where ROM is lacking. Discussed that ROM may or may not improve with continued treatment but we will continue to work on pain control and postural stability to decrease further risk of spasm/injury in ADLs.    OBJECTIVE IMPAIRMENTS decreased ROM, hypomobility, increased fascial restrictions, increased muscle spasms, impaired flexibility, postural dysfunction, and pain.   ACTIVITY LIMITATIONS sleeping  PARTICIPATION LIMITATIONS: driving  PERSONAL FACTORS none  REHAB POTENTIAL: Good  CLINICAL DECISION MAKING: Stable/uncomplicated  EVALUATION COMPLEXITY: Low   GOALS:  SHORT TERM GOALS: Target date: 06/19/2022  Pt will be independent and compliant with HEP for improved pain, ROM, strength, and function.  Baseline:  Goal status: achieved for short term HEP  2.  Pt will demo a 10 deg increase in R cervical rotations and L cervical Sb'ing for improved stiffness and mobility. Baseline:  Goal status: ongoing due to recent set back  3.  Pt will report at least a 25% improvement in pain and sx's 1st  thing in AM.  Baseline:  Goal status: ongoing due to recent set back, prior to the incident this was met.     LONG TERM GOALS: Target date: 07/10/2022  Pt will be able to sleep at least 5/7 nights/wk without pain disturbing her. Baseline:  Goal status: INITIAL  2.  Pt will report being  able to turn her head in the car while driving without significant pain or limitation.   Baseline:  Goal status: INITIAL  3.  Pt will report at least a 70% improvement in pain and sx's overall.  Baseline:  Goal status: INITIAL  4.  Pt will have reduced pain with cervical AROM and demo improved rotation to 60 deg bilat and > 30 deg in Sb'ing bilat for improved stiffness and mobility.  Baseline:  Goal status: INITIAL     PLAN: PT FREQUENCY: 2x/week  PT DURATION: 6 weeks  PLANNED INTERVENTIONS: Therapeutic exercises, Therapeutic activity, Neuromuscular re-education, Patient/Family education, Self Care, Joint mobilization, Aquatic Therapy, Dry Needling, Electrical stimulation, Spinal mobilization, Cryotherapy, Moist heat, Taping, Ultrasound, Manual therapy, and Re-evaluation  PLAN FOR NEXT SESSION: continue DN and manual therapy. Deep neck flexor strengthening.   Kallum Jorgensen C. Javaun Dimperio PT, DPT 07/07/22 12:33 PM

## 2022-07-10 NOTE — Therapy (Unsigned)
OUTPATIENT PHYSICAL THERAPY CERVICAL EVALUATION   Patient Name: Martha [name] MRN: [id number] DOB:[birth year], 61 y.o., female Today's Date: 07/11/2022   PT End of Session - 07/11/22 1512     Visit Number 9    Number of Visits 12    Date for PT Re-Evaluation 07/10/22    Authorization Type [employer]    PT Start Time 0630    PT Stop Time 1550    PT Time Calculation (min) 39 min             Past Medical History:  Diagnosis Date   Anxiety    Cancer ([place]) 2022   right breast LCIS   Cancer ([place]) 2019   left breast   Depression    Dysrhythmia    SVT, s/p ablation ~ 2012 in at [facility]   History of radiation therapy 04/22/18-06/03/18   Left Breast, left SCV, axilla 50 Gy in 25 fractions, Left breast boost 10 Gy in 5 fractions.    Hypothyroidism    Personal history of radiation therapy    PONV (postoperative nausea and vomiting)    Thyroid disease    Past Surgical History:  Procedure Laterality Date   APPENDECTOMY     BREAST BIOPSY Right 2014   fibroadenoma   BREAST BIOPSY Left 01/16/2018   BREAST BIOPSY Right 02/02/2020   LCIS   BREAST BIOPSY Right 08/04/2020   x2 LCIS   BREAST BIOPSY Right 08/13/2020   x2   BREAST EXCISIONAL BIOPSY Left 1990   benign   BREAST EXCISIONAL BIOPSY Right 02/02/2020   LCIS   BREAST LUMPECTOMY Left 01/22/2018   BREAST LUMPECTOMY WITH AXILLARY LYMPH NODE BIOPSY Left 02/21/2018   Procedure: LEFT BREAST LUMPECTOMY WITH AXILLARY LYMPH NODE BIOPSY;  Surgeon: [name], MD;  Location: [place];  Service: General;  Laterality: Left;   BREAST LUMPECTOMY WITH RADIOACTIVE SEED LOCALIZATION Right 03/18/2020   Procedure: RIGHT BREAST LUMPECTOMY WITH RADIOACTIVE SEED LOCALIZATION;  Surgeon: [name], MD;  Location: [place];  Service: General;  Laterality: Right;   BREAST SURGERY     ENDOMETRIAL ABLATION     EYE SURGERY     GLAUCOMA SURGERY Bilateral    RADIOACTIVE SEED GUIDED EXCISIONAL BREAST BIOPSY Right 04/28/2021    Procedure: RADIOACTIVE SEED GUIDED EXCISIONAL RIGHT BREAST BIOPSY X2;  Surgeon: [name], MD;  Location: [place];  Service: General;  Laterality: Right;   RE-EXCISION OF BREAST LUMPECTOMY Left 03/20/2018   Procedure: RE-EXCISION OF BREAST LUMPECTOMY;  Surgeon: [name], MD;  Location: [place];  Service: General;  Laterality: Left;   Patient Active Problem List   Diagnosis Date Noted   Cervicalgia 06/16/2022   Stress 05/04/2022   Hyperglycemia 04/14/2021   Lobular carcinoma in situ (LCIS) of right breast 02/04/2020   Malignant neoplasm of lower-inner quadrant of left breast in female, estrogen receptor positive ([place]) 01/22/2018   Hypothyroidism 04/04/2017   Nicotine dependence with current use 04/04/2017   Excessive drinking of alcohol 04/04/2017   Menopausal symptom 04/04/2017     REFERRING PROVIDER: [name], MD  REFERRING DIAG: M54.2 (ICD-10-CM) - Neck pain  THERAPY DIAG:  Cervicalgia   Rationale for Evaluation and Treatment Rehabilitation  ONSET DATE: July 2023  SUBJECTIVE:  SUBJECTIVE STATEMENT: Pt states that sitting in the car all day caused her increased neck pain. She is otherwise doing well.   PERTINENT HISTORY:  Hx of Breast CA  PAIN:  NPRS:  Current:  8/10 Location:  R sided cervical paraspinals, R post aspect of head, UT, medial scap, and R post shoulder Type:  dull, aching, sharp, throbbing; constant  PRECAUTIONS: None  WEIGHT BEARING RESTRICTIONS No  FALLS:  Has patient fallen in last 6 months? No  LIVING ENVIRONMENT: Lives with: husband.  Mother lives with her Lives in: 1 story home Stairs: 1 step in home, 3/4 steps in front/back and has rails   OCCUPATION: Pt was working in [employer], but  has stopped working now due to taking care of mother.   PLOF: Independent; Pt was able to turn head, drive and perform her daily activities without significant pain or difficulty.   PATIENT GOALS improve flexibility, reduce stress, improve pain  OBJECTIVE:   DIAGNOSTIC FINDINGS:  Cervical X rays on 05/04/22:  FINDINGS: Straightening of the expected cervical lordosis. No significant spondylolisthesis.   Vertebral body height is maintained. No fracture is identified.   Disc space narrowing is greatest at C5-C6 and C6-C7 (moderate to moderately advanced at these levels). Multilevel anterior and posterior osteophytes, and uncovertebral hypertrophy, also greatest at C5-C6 and C6-C7. Facet arthrosis at C6-C7 (mild) and C7-T1 (mild-to-moderate). Multilevel neural foraminal narrowing, greatest bilaterally at C4-C5, C5-C6 and C6-C7.   IMPRESSION: Cervical spondylosis, as described.   Nonspecific straightening of the expected cervical lordosis  PATIENT SURVEYS:  FOTO 38 with a goal of 57 at visit #12  SENSATION: LT:  2+ t/o bilat dermatomes  POSTURE:  FHP  PALPATION: TTP with R > L UT.  tightness in bilat UT with muscle spasms in bilat worse on R.     CERVICAL ROM:   Active ROM A/PROM (deg) eval AROM 11/10  Flexion WNL with pain   Extension 38 deg with pain   Right lateral flexion 24 with pain 20 no pain  Left lateral flexion 15 with pain 5 with Rt-side pain  Right rotation 30 with pain 10  Left rotation 50 with pain 30 with Rt-side pain   (Blank rows = not tested)  UPPER EXTREMITY ROM:  Active ROM Right eval Left eval  Shoulder flexion WNL WNL  Shoulder extension    Shoulder abduction WNL WNL  Shoulder adduction    Shoulder extension    Shoulder internal rotation    Shoulder external rotation    Elbow flexion    Elbow extension    Wrist flexion    Wrist extension    Wrist ulnar deviation    Wrist radial deviation    Wrist pronation    Wrist supination      (Blank rows = not tested)  UPPER EXTREMITY MMT:  MMT Right eval Left eval  Shoulder flexion 5/5 5/5  Shoulder extension    Shoulder abduction 5/5 5/5  Shoulder adduction    Shoulder extension    Shoulder internal rotation    Shoulder external rotation    Middle trapezius    Lower trapezius    Elbow flexion 5/5 5/5  Elbow extension    Wrist flexion    Wrist extension    Wrist ulnar deviation    Wrist radial deviation    Wrist pronation    Wrist supination    Grip strength     (Blank rows = not tested)  CERVICAL SPECIAL TESTS:  Compression Test:  pain in R sided cervical paraspinals Spurling's Test:  R:  pain in R side of head, L:  negative   TODAY'S TREATMENT:  Treatment                            07/11/22:  Trigger Point Dry Needling, Manual Therapy Treatment:  Subsequent education regarding Trigger Point Dry Needling: Subsequent Did patient give consent to treatment with Trigger Point Dry Needling: Yes TPDN with skilled palpation and monitoring followed by STM to the following muscles: Rt SCM both bellies, Rt scalenes, Rt upper trap, posterior obliques.   Treatment                            07/07/22:  Trigger Point Dry Needling, Manual Therapy Treatment:  Initial or subsequent education regarding Trigger Point Dry Needling: Subsequent Did patient give consent to treatment with Trigger Point Dry Needling: Yes TPDN with skilled palpation and monitoring followed by STM to the following muscles: Rt upper trap, scalenes, SCM  Cdrvical traction, STM to subcoccipitals, paraspinals & upper traps, right first rib mobilization   Treatment                            06/29/22:  Trigger Point Dry Needling, Manual Therapy Treatment:  Initial or subsequent education regarding Trigger Point Dry Needling: Subsequent Did patient give consent to treatment with Trigger Point Dry Needling: Yes TPDN with skilled palpation and monitoring followed by STM to the following muscles: Rt  SCM both bellies, Rt scalenes, Rt upper trap  Supine cervical rotation Supine cervical retraction Supine ER yellow tband Supine horiz abd yellow tband Supine flexion stretch with holding tension on band   Treatment                            06/27/22:  Manual STM to Rt upper trap, levator, supraspinatus Seated breathing with rib cage expansion Discussed use of home tools for pain management    PATIENT EDUCATION:  Education details: HEP, POC, dx, relevant anatomy, rationale of exercises, and objective findings.  PT answered Pt's questions.  Person educated: Patient Education method: Explanation, Demonstration, Tactile cues, Verbal cues, and Handouts Education comprehension: verbalized understanding, returned demonstration, verbal cues required, tactile cues required, and needs further education   HOME EXERCISE PROGRAM: Access Code: [email or web address] URL: [email or web address]  ASSESSMENT:  CLINICAL IMPRESSION: Decrease in pain today with DN and manual therapy but ROM is still very limited. Discussed that ROM may or may not improve with continued treatment but we will continue to work on pain control and postural stability to decrease further risk of spasm/injury in ADLs. Recommended pt follow up with her MD for next steps. Pt plans to call MD and is requesting an MRI at this time.    OBJECTIVE IMPAIRMENTS decreased ROM, hypomobility, increased fascial restrictions, increased muscle spasms, impaired flexibility, postural dysfunction, and pain.   ACTIVITY LIMITATIONS sleeping  PARTICIPATION LIMITATIONS: driving  PERSONAL FACTORS none  REHAB POTENTIAL: Good  CLINICAL DECISION MAKING: Stable/uncomplicated  EVALUATION COMPLEXITY: Low   GOALS:  SHORT TERM GOALS: Target date: 06/19/2022  Pt will be independent and compliant with HEP for improved pain, ROM, strength, and function.  Baseline:  Goal status: achieved for short term HEP  2.  Pt will demo a 10 deg  increase in R cervical rotations and  L cervical Sb'ing for improved stiffness and mobility. Baseline:  Goal status: ongoing due to recent set back  3.  Pt will report at least a 25% improvement in pain and sx's 1st thing in AM.  Baseline:  Goal status: ongoing due to recent set back, prior to the incident this was met.     LONG TERM GOALS: Target date: 07/10/2022  Pt will be able to sleep at least 5/7 nights/wk without pain disturbing her. Baseline:  Goal status: INITIAL  2.  Pt will report being able to turn her head in the car while driving without significant pain or limitation.   Baseline:  Goal status: INITIAL  3.  Pt will report at least a 70% improvement in pain and sx's overall.  Baseline:  Goal status: INITIAL  4.  Pt will have reduced pain with cervical AROM and demo improved rotation to 60 deg bilat and > 30 deg in Sb'ing bilat for improved stiffness and mobility.  Baseline:  Goal status: INITIAL     PLAN: PT FREQUENCY: 2x/week  PT DURATION: 6 weeks  PLANNED INTERVENTIONS: Therapeutic exercises, Therapeutic activity, Neuromuscular re-education, Patient/Family education, Self Care, Joint mobilization, Aquatic Therapy, Dry Needling, Electrical stimulation, Spinal mobilization, Cryotherapy, Moist heat, Taping, Ultrasound, Manual therapy, and Re-evaluation  PLAN FOR NEXT SESSION: continue DN and manual therapy. Deep neck flexor strengthening.  Rudi Heap PT, DPT 07/11/22  3:51 PM

## 2022-07-11 ENCOUNTER — Encounter (HOSPITAL_BASED_OUTPATIENT_CLINIC_OR_DEPARTMENT_OTHER): Payer: BC Managed Care – PPO | Admitting: Physical Therapy

## 2022-07-11 ENCOUNTER — Encounter: Payer: Self-pay | Admitting: Family Medicine

## 2022-07-11 ENCOUNTER — Ambulatory Visit (HOSPITAL_BASED_OUTPATIENT_CLINIC_OR_DEPARTMENT_OTHER): Payer: BC Managed Care – PPO | Admitting: Physical Therapy

## 2022-07-11 ENCOUNTER — Encounter (HOSPITAL_BASED_OUTPATIENT_CLINIC_OR_DEPARTMENT_OTHER): Payer: Self-pay | Admitting: Physical Therapy

## 2022-07-11 DIAGNOSIS — M542 Cervicalgia: Secondary | ICD-10-CM | POA: Diagnosis not present

## 2022-07-11 NOTE — Therapy (Unsigned)
OUTPATIENT PHYSICAL THERAPY CERVICAL EVALUATION   Patient Name: Martha [name] MRN: [id number] DOB:[birth year], 61 y.o., female Today's Date: 07/11/2022     Past Medical History:  Diagnosis Date   Anxiety    Cancer ([place]) 2022   right breast LCIS   Cancer ([place]) 2019   left breast   Depression    Dysrhythmia    SVT, s/p ablation ~ 2012 in at [facility]   History of radiation therapy 04/22/18-06/03/18   Left Breast, left SCV, axilla 50 Gy in 25 fractions, Left breast boost 10 Gy in 5 fractions.    Hypothyroidism    Personal history of radiation therapy    PONV (postoperative nausea and vomiting)    Thyroid disease    Past Surgical History:  Procedure Laterality Date   APPENDECTOMY     BREAST BIOPSY Right 2014   fibroadenoma   BREAST BIOPSY Left 01/16/2018   BREAST BIOPSY Right 02/02/2020   LCIS   BREAST BIOPSY Right 08/04/2020   x2 LCIS   BREAST BIOPSY Right 08/13/2020   x2   BREAST EXCISIONAL BIOPSY Left 1990   benign   BREAST EXCISIONAL BIOPSY Right 02/02/2020   LCIS   BREAST LUMPECTOMY Left 01/22/2018   BREAST LUMPECTOMY WITH AXILLARY LYMPH NODE BIOPSY Left 02/21/2018   Procedure: LEFT BREAST LUMPECTOMY WITH AXILLARY LYMPH NODE BIOPSY;  Surgeon: [name], MD;  Location: [place];  Service: General;  Laterality: Left;   BREAST LUMPECTOMY WITH RADIOACTIVE SEED LOCALIZATION Right 03/18/2020   Procedure: RIGHT BREAST LUMPECTOMY WITH RADIOACTIVE SEED LOCALIZATION;  Surgeon: [name], MD;  Location: [place];  Service: General;  Laterality: Right;   BREAST SURGERY     ENDOMETRIAL ABLATION     EYE SURGERY     GLAUCOMA SURGERY Bilateral    RADIOACTIVE SEED GUIDED EXCISIONAL BREAST BIOPSY Right 04/28/2021   Procedure: RADIOACTIVE SEED GUIDED EXCISIONAL RIGHT BREAST BIOPSY X2;  Surgeon: [name], MD;  Location: [place];  Service: General;  Laterality: Right;   RE-EXCISION OF BREAST LUMPECTOMY Left 03/20/2018   Procedure:  RE-EXCISION OF BREAST LUMPECTOMY;  Surgeon: [name], MD;  Location: [place];  Service: General;  Laterality: Left;   Patient Active Problem List   Diagnosis Date Noted   Cervicalgia 06/16/2022   Stress 05/04/2022   Hyperglycemia 04/14/2021   Lobular carcinoma in situ (LCIS) of right breast 02/04/2020   Malignant neoplasm of lower-inner quadrant of left breast in female, estrogen receptor positive ([place]) 01/22/2018   Hypothyroidism 04/04/2017   Nicotine dependence with current use 04/04/2017   Excessive drinking of alcohol 04/04/2017   Menopausal symptom 04/04/2017     REFERRING PROVIDER: [name], MD  REFERRING DIAG: M54.2 (ICD-10-CM) - Neck pain  THERAPY DIAG:  No diagnosis found.   Rationale for Evaluation and Treatment Rehabilitation  ONSET DATE: July 2023  SUBJECTIVE:  SUBJECTIVE STATEMENT: Pt states that sitting in the car all day caused her increased neck pain. She is otherwise doing well.   PERTINENT HISTORY:  Hx of Breast CA  PAIN:  NPRS:  Current:  8/10 Location:  R sided cervical paraspinals, R post aspect of head, UT, medial scap, and R post shoulder Type:  dull, aching, sharp, throbbing; constant  PRECAUTIONS: None  WEIGHT BEARING RESTRICTIONS No  FALLS:  Has patient fallen in last 6 months? No  LIVING ENVIRONMENT: Lives with: husband.  Mother lives with her Lives in: 1 story home Stairs: 1 step in home, 3/4 steps in front/back and has rails   OCCUPATION: Pt was working in [employer], but has stopped working now due to taking care of mother.   PLOF: Independent; Pt was able to turn head, drive and perform her daily activities without significant pain or difficulty.   PATIENT GOALS improve flexibility, reduce  stress, improve pain  OBJECTIVE:   DIAGNOSTIC FINDINGS:  Cervical X rays on 05/04/22:  FINDINGS: Straightening of the expected cervical lordosis. No significant spondylolisthesis.   Vertebral body height is maintained. No fracture is identified.   Disc space narrowing is greatest at C5-C6 and C6-C7 (moderate to moderately advanced at these levels). Multilevel anterior and posterior osteophytes, and uncovertebral hypertrophy, also greatest at C5-C6 and C6-C7. Facet arthrosis at C6-C7 (mild) and C7-T1 (mild-to-moderate). Multilevel neural foraminal narrowing, greatest bilaterally at C4-C5, C5-C6 and C6-C7.   IMPRESSION: Cervical spondylosis, as described.   Nonspecific straightening of the expected cervical lordosis  PATIENT SURVEYS:  FOTO 38 with a goal of 57 at visit #12  SENSATION: LT:  2+ t/o bilat dermatomes  POSTURE:  FHP  PALPATION: TTP with R > L UT.  tightness in bilat UT with muscle spasms in bilat worse on R.     CERVICAL ROM:   Active ROM A/PROM (deg) eval AROM 11/10  Flexion WNL with pain   Extension 38 deg with pain   Right lateral flexion 24 with pain 20 no pain  Left lateral flexion 15 with pain 5 with Rt-side pain  Right rotation 30 with pain 10  Left rotation 50 with pain 30 with Rt-side pain   (Blank rows = not tested)  UPPER EXTREMITY ROM:  Active ROM Right eval Left eval  Shoulder flexion WNL WNL  Shoulder extension    Shoulder abduction WNL WNL  Shoulder adduction    Shoulder extension    Shoulder internal rotation    Shoulder external rotation    Elbow flexion    Elbow extension    Wrist flexion    Wrist extension    Wrist ulnar deviation    Wrist radial deviation    Wrist pronation    Wrist supination     (Blank rows = not tested)  UPPER EXTREMITY MMT:  MMT Right eval Left eval  Shoulder flexion 5/5 5/5  Shoulder extension    Shoulder abduction 5/5 5/5  Shoulder adduction    Shoulder extension    Shoulder internal  rotation    Shoulder external rotation    Middle trapezius    Lower trapezius    Elbow flexion 5/5 5/5  Elbow extension    Wrist flexion    Wrist extension    Wrist ulnar deviation    Wrist radial deviation    Wrist pronation    Wrist supination    Grip strength     (Blank rows = not tested)  CERVICAL SPECIAL TESTS:  Compression Test:  pain in R sided cervical paraspinals Spurling's Test:  R:  pain in R side of head, L:  negative   TODAY'S TREATMENT:  Treatment                            07/11/22:  Trigger Point Dry Needling, Manual Therapy Treatment:  Subsequent education regarding Trigger Point Dry Needling: Subsequent Did patient give consent to treatment with Trigger Point Dry Needling: Yes TPDN with skilled palpation and monitoring followed by STM to the following muscles: Rt SCM both bellies, Rt scalenes, Rt upper trap, posterior obliques.   Treatment                            07/07/22:  Trigger Point Dry Needling, Manual Therapy Treatment:  Initial or subsequent education regarding Trigger Point Dry Needling: Subsequent Did patient give consent to treatment with Trigger Point Dry Needling: Yes TPDN with skilled palpation and monitoring followed by STM to the following muscles: Rt upper trap, scalenes, SCM  Cdrvical traction, STM to subcoccipitals, paraspinals & upper traps, right first rib mobilization   Treatment                            06/29/22:  Trigger Point Dry Needling, Manual Therapy Treatment:  Initial or subsequent education regarding Trigger Point Dry Needling: Subsequent Did patient give consent to treatment with Trigger Point Dry Needling: Yes TPDN with skilled palpation and monitoring followed by STM to the following muscles: Rt SCM both bellies, Rt scalenes, Rt upper trap  Supine cervical rotation Supine cervical retraction Supine ER yellow tband Supine horiz abd yellow tband Supine flexion stretch with holding tension on band   Treatment                             06/27/22:  Manual STM to Rt upper trap, levator, supraspinatus Seated breathing with rib cage expansion Discussed use of home tools for pain management    PATIENT EDUCATION:  Education details: HEP, POC, dx, relevant anatomy, rationale of exercises, and objective findings.  PT answered Pt's questions.  Person educated: Patient Education method: Explanation, Demonstration, Tactile cues, Verbal cues, and Handouts Education comprehension: verbalized understanding, returned demonstration, verbal cues required, tactile cues required, and needs further education   HOME EXERCISE PROGRAM: Access Code: [email or web address] URL: [email or web address]  ASSESSMENT:  CLINICAL IMPRESSION: Decrease in pain today with DN and manual therapy but ROM is still very limited. Discussed that ROM may or may not improve with continued treatment but we will continue to work on pain control and postural stability to decrease further risk of spasm/injury in ADLs. Recommended pt follow up with her MD for next steps. Pt plans to call MD and is requesting an MRI at this time.    OBJECTIVE IMPAIRMENTS decreased ROM, hypomobility, increased fascial restrictions, increased muscle spasms, impaired flexibility, postural dysfunction, and pain.   ACTIVITY LIMITATIONS sleeping  PARTICIPATION LIMITATIONS: driving  PERSONAL FACTORS none  REHAB POTENTIAL: Good  CLINICAL DECISION MAKING: Stable/uncomplicated  EVALUATION COMPLEXITY: Low   GOALS:  SHORT TERM GOALS: Target date: 06/19/2022  Pt will be independent and compliant with HEP for improved pain, ROM, strength, and function.  Baseline:  Goal status: achieved for short term HEP  2.  Pt will demo a 10 deg increase in R cervical rotations and L  cervical Sb'ing for improved stiffness and mobility. Baseline:  Goal status: ongoing due to recent set back  3.  Pt will report at least a 25% improvement in pain and sx's 1st thing in AM.   Baseline:  Goal status: ongoing due to recent set back, prior to the incident this was met.     LONG TERM GOALS: Target date: 07/10/2022  Pt will be able to sleep at least 5/7 nights/wk without pain disturbing her. Baseline:  Goal status: INITIAL  2.  Pt will report being able to turn her head in the car while driving without significant pain or limitation.   Baseline:  Goal status: INITIAL  3.  Pt will report at least a 70% improvement in pain and sx's overall.  Baseline:  Goal status: INITIAL  4.  Pt will have reduced pain with cervical AROM and demo improved rotation to 60 deg bilat and > 30 deg in Sb'ing bilat for improved stiffness and mobility.  Baseline:  Goal status: INITIAL     PLAN: PT FREQUENCY: 2x/week  PT DURATION: 6 weeks  PLANNED INTERVENTIONS: Therapeutic exercises, Therapeutic activity, Neuromuscular re-education, Patient/Family education, Self Care, Joint mobilization, Aquatic Therapy, Dry Needling, Electrical stimulation, Spinal mobilization, Cryotherapy, Moist heat, Taping, Ultrasound, Manual therapy, and Re-evaluation  PLAN FOR NEXT SESSION: continue DN and manual therapy. Deep neck flexor strengthening.  Rudi Heap PT, DPT 07/11/22  3:51 PM

## 2022-07-12 NOTE — Telephone Encounter (Signed)
Please advise 

## 2022-07-13 ENCOUNTER — Ambulatory Visit (HOSPITAL_BASED_OUTPATIENT_CLINIC_OR_DEPARTMENT_OTHER): Payer: BC Managed Care – PPO | Admitting: Physical Therapy

## 2022-07-13 ENCOUNTER — Encounter (HOSPITAL_BASED_OUTPATIENT_CLINIC_OR_DEPARTMENT_OTHER): Payer: BC Managed Care – PPO | Admitting: Physical Therapy

## 2022-07-13 ENCOUNTER — Encounter (HOSPITAL_BASED_OUTPATIENT_CLINIC_OR_DEPARTMENT_OTHER): Payer: Self-pay | Admitting: Physical Therapy

## 2022-07-13 DIAGNOSIS — M542 Cervicalgia: Secondary | ICD-10-CM

## 2022-07-13 NOTE — Therapy (Addendum)
OUTPATIENT PHYSICAL THERAPY CERVICAL EVALUATION   Patient Name: Martha [name] MRN: [id number] DOB:1960/10/01, 61 y.o., female Today's Date: 07/13/2022   PT End of Session - 07/13/22 1522     Visit Number 10    Number of Visits 12    Date for PT Re-Evaluation 07/13/22    Authorization Type BCBS    PT Start Time 5027    PT Stop Time 1550    PT Time Calculation (min) 29 min    Activity Tolerance Patient tolerated treatment well    Behavior During Therapy [facility] for tasks assessed/performed             Past Medical History:  Diagnosis Date   Anxiety    Cancer ([place]) 2022   right breast LCIS   Cancer ([place]) 2019   left breast   Depression    Dysrhythmia    SVT, s/p ablation ~ 2012 in at [facility]   History of radiation therapy 04/22/18-06/03/18   Left Breast, left SCV, axilla 50 Gy in 25 fractions, Left breast boost 10 Gy in 5 fractions.    Hypothyroidism    Personal history of radiation therapy    PONV (postoperative nausea and vomiting)    Thyroid disease    Past Surgical History:  Procedure Laterality Date   APPENDECTOMY     BREAST BIOPSY Right 2014   fibroadenoma   BREAST BIOPSY Left 01/16/2018   BREAST BIOPSY Right 02/02/2020   LCIS   BREAST BIOPSY Right 08/04/2020   x2 LCIS   BREAST BIOPSY Right 08/13/2020   x2   BREAST EXCISIONAL BIOPSY Left 1990   benign   BREAST EXCISIONAL BIOPSY Right 02/02/2020   LCIS   BREAST LUMPECTOMY Left 01/22/2018   BREAST LUMPECTOMY WITH AXILLARY LYMPH NODE BIOPSY Left 02/21/2018   Procedure: LEFT BREAST LUMPECTOMY WITH AXILLARY LYMPH NODE BIOPSY;  Surgeon: [name], MD;  Location: [place];  Service: General;  Laterality: Left;   BREAST LUMPECTOMY WITH RADIOACTIVE SEED LOCALIZATION Right 03/18/2020   Procedure: RIGHT BREAST LUMPECTOMY WITH RADIOACTIVE SEED LOCALIZATION;  Surgeon: [name], MD;  Location: [place];  Service: General;  Laterality: Right;   BREAST SURGERY     ENDOMETRIAL ABLATION     EYE  SURGERY     GLAUCOMA SURGERY Bilateral    RADIOACTIVE SEED GUIDED EXCISIONAL BREAST BIOPSY Right 04/28/2021   Procedure: RADIOACTIVE SEED GUIDED EXCISIONAL RIGHT BREAST BIOPSY X2;  Surgeon: [name], MD;  Location: [place];  Service: General;  Laterality: Right;   RE-EXCISION OF BREAST LUMPECTOMY Left 03/20/2018   Procedure: RE-EXCISION OF BREAST LUMPECTOMY;  Surgeon: [name], MD;  Location: [place];  Service: General;  Laterality: Left;   Patient Active Problem List   Diagnosis Date Noted   Cervicalgia 06/16/2022   Stress 05/04/2022   Hyperglycemia 04/14/2021   Lobular carcinoma in situ (LCIS) of right breast 02/04/2020   Malignant neoplasm of lower-inner quadrant of left breast in female, estrogen receptor positive ([place]) 01/22/2018   Hypothyroidism 04/04/2017   Nicotine dependence with current use 04/04/2017   Excessive drinking of alcohol 04/04/2017   Menopausal symptom 04/04/2017     REFERRING PROVIDER: [name], MD  REFERRING DIAG: M54.2 (ICD-10-CM) - Neck pain  THERAPY DIAG:  Cervicalgia   Rationale for Evaluation and Treatment Rehabilitation  ONSET DATE: July 2023  SUBJECTIVE:  SUBJECTIVE STATEMENT:  Have a f/u with Dr [name] tomorrow. A lot of stress due to care for Mom who is at home with me.  Denies Vision changes but has constant HA- dull at frontal, tighter at occipital region, mostly on Rt.   PERTINENT HISTORY:  Hx of Breast CA  PAIN:  NPRS:  Current:  7/10 Location:  R sided cervical paraspinals, R post aspect of head, UT, medial scap, and R post shoulder Type:  constantly dull, sometimes throbbing  PRECAUTIONS: None  WEIGHT BEARING RESTRICTIONS No  FALLS:  Has patient fallen in last 6 months?  No  LIVING ENVIRONMENT: Lives with: husband.  Mother lives with her Lives in: 1 story home Stairs: 1 step in home, 3/4 steps in front/back and has rails   OCCUPATION: Pt was working in [employer], but has stopped working now due to taking care of mother.   PLOF: Independent; Pt was able to turn head, drive and perform her daily activities without significant pain or difficulty.   PATIENT GOALS improve flexibility, reduce stress, improve pain  OBJECTIVE:   DIAGNOSTIC FINDINGS:  Cervical X rays on 05/04/22:  FINDINGS: Straightening of the expected cervical lordosis. No significant spondylolisthesis.   Vertebral body height is maintained. No fracture is identified.   Disc space narrowing is greatest at C5-C6 and C6-C7 (moderate to moderately advanced at these levels). Multilevel anterior and posterior osteophytes, and uncovertebral hypertrophy, also greatest at C5-C6 and C6-C7. Facet arthrosis at C6-C7 (mild) and C7-T1 (mild-to-moderate). Multilevel neural foraminal narrowing, greatest bilaterally at C4-C5, C5-C6 and C6-C7.   IMPRESSION: Cervical spondylosis, as described.   Nonspecific straightening of the expected cervical lordosis  PATIENT SURVEYS:  FOTO 38 with a goal of 57 at visit #12 11/16 FOTO    SENSATION: UE dermatomes WFL  POSTURE:  resolved FHP, Rt scapular winging, decreased normal curvatures of spine.   PALPATION: Gross tightness in cervical musculature with Rt levator scap spasm most notable upon palpation.   CERVICAL ROM:   Active ROM A/PROM (deg) eval AROM 11/10 AROM 11/16   Flexion WNL with pain    Extension 38 deg with pain    Right lateral flexion 24 with pain 20 no pain 16  Left lateral flexion 15 with pain 5 with Rt-side pain 14 Rt-side pain  Right rotation 30 with pain 10 6  Left rotation 50 with pain 30 with Rt-side pain 24 Rt-side pulling   (Blank rows = not tested)  UPPER EXTREMITY ROM:  WFL  UPPER EXTREMITY  MMT:  [facility]  CERVICAL SPECIAL TESTS:  Compression Test:  pain in R sided cervical paraspinals Spurling's Test:  R:  pain in R side of head, L:  negative  11/16- compression and spurling negative for neural symptoms but painful to MSK symptoms   TODAY'S TREATMENT:  Treatment                            07/11/22:  Trigger Point Dry Needling, Manual Therapy Treatment:  Subsequent education regarding Trigger Point Dry Needling: Subsequent Did patient give consent to treatment with Trigger Point Dry Needling: Yes TPDN with skilled palpation and monitoring followed by STM to the following muscles: Rt SCM both bellies, Rt scalenes, Rt upper trap, posterior obliques.   Treatment                            07/07/22:  Trigger Point Dry Needling, Manual  Therapy Treatment:  Initial or subsequent education regarding Trigger Point Dry Needling: Subsequent Did patient give consent to treatment with Trigger Point Dry Needling: Yes TPDN with skilled palpation and monitoring followed by STM to the following muscles: Rt upper trap, scalenes, SCM  Cdrvical traction, STM to subcoccipitals, paraspinals & upper traps, right first rib mobilization   Treatment                            06/29/22:  Trigger Point Dry Needling, Manual Therapy Treatment:  Initial or subsequent education regarding Trigger Point Dry Needling: Subsequent Did patient give consent to treatment with Trigger Point Dry Needling: Yes TPDN with skilled palpation and monitoring followed by STM to the following muscles: Rt SCM both bellies, Rt scalenes, Rt upper trap  Supine cervical rotation Supine cervical retraction Supine ER yellow tband Supine horiz abd yellow tband Supine flexion stretch with holding tension on band   Treatment                            06/27/22:  Manual STM to Rt upper trap, levator, supraspinatus Seated breathing with rib cage expansion Discussed use of home tools for pain management    PATIENT  EDUCATION:  Education details: HEP, POC, dx, relevant anatomy, rationale of exercises, and objective findings.  PT answered Pt's questions.  Person educated: Patient Education method: Explanation, Demonstration, Tactile cues, Verbal cues, and Handouts Education comprehension: verbalized understanding, returned demonstration, verbal cues required, tactile cues required, and needs further education   HOME EXERCISE PROGRAM: Access Code: [email or web address] URL: [email or web address]  ASSESSMENT:  CLINICAL IMPRESSION: Objective measures taken today in the entirety of appointment spent discussing plan to hold plan of care until she returns to MD.  She has not met her goals at this time and has demonstrated a reduction in available cervical range of motion.  She continues to have significant pain but has been able to maintain upper extremity strength as well as demonstrate proper upright posture.  She admits that she has high stress levels due to being primary caregiver for her mother and is currently working with a provider for stress management.  We did discuss the possibility of conversion disorder today and handout for information was provided.  Patient plans to seek MRI of the cervical spine and is following up with Dr. [name] on Tuesday.  Encouraged her to reach out to me with any questions in the meantime.   OBJECTIVE IMPAIRMENTS decreased ROM, hypomobility, increased fascial restrictions, increased muscle spasms, impaired flexibility, postural dysfunction, and pain.   ACTIVITY LIMITATIONS sleeping  PARTICIPATION LIMITATIONS: driving  PERSONAL FACTORS none  REHAB POTENTIAL: Good  CLINICAL DECISION MAKING: Stable/uncomplicated  EVALUATION COMPLEXITY: Low   GOALS:  SHORT TERM GOALS: Target date: 06/19/2022  Pt will be independent and compliant with HEP for improved pain, ROM, strength, and function.  Baseline:  Goal status: achieved for short term HEP  2.  Pt will demo a 10  deg increase in R cervical rotations and L cervical Sb'ing for improved stiffness and mobility. Baseline:  Goal status: ongoing due to recent set back  3.  Pt will report at least a 25% improvement in pain and sx's 1st thing in AM.  Baseline:  Goal status: ongoing due to recent set back, prior to the incident this was met.     LONG TERM GOALS: Target date: 07/10/2022  Pt will be able to sleep  at least 5/7 nights/wk without pain disturbing her.  Goal status: I still get up at least once every night but it is better than it was  2.  Pt will report being able to turn her head in the car while driving without significant pain or limitation.    Goal status: not met  3.  Pt will report at least a 70% improvement in pain and sx's overall.  Baseline:  Goal status: not met  4.  Pt will have reduced pain with cervical AROM and demo improved rotation to 60 deg bilat and > 30 deg in Sb'ing bilat for improved stiffness and mobility.  Baseline:  Goal status:not met`     PLAN: PT FREQUENCY: 2x/week  PT DURATION: 6 weeks  PLANNED INTERVENTIONS: Therapeutic exercises, Therapeutic activity, Neuromuscular re-education, Patient/Family education, Self Care, Joint mobilization, Aquatic Therapy, Dry Needling, Electrical stimulation, Spinal mobilization, Cryotherapy, Moist heat, Taping, Ultrasound, Manual therapy, and Re-evaluation  PLAN FOR NEXT SESSION: on hold  Shaleigh Laubscher C. Talia Hoheisel PT, DPT 07/13/22 5:16 PM   PHYSICAL THERAPY DISCHARGE SUMMARY  Visits from Start of Care: 10  Current functional level related to goals / functional outcomes: See above   Remaining deficits: See above   Education / Equipment: Anatomy of condition, POC, HEP, exercise form/rationale    Patient agrees to discharge. Patient goals were not met. Patient is being discharged due to a change in medical status. Yeraldine Forney C. Hien Cunliffe PT, DPT 10/19/22 11:27 AM

## 2022-07-13 NOTE — Telephone Encounter (Signed)
Recommend referral to sports medicine.  [name]. [name], MD 07/13/2022 12:47 PM

## 2022-07-14 ENCOUNTER — Other Ambulatory Visit: Payer: Self-pay | Admitting: *Deleted

## 2022-07-14 DIAGNOSIS — M542 Cervicalgia: Secondary | ICD-10-CM

## 2022-07-14 NOTE — Telephone Encounter (Signed)
Referral to sports medicine placed

## 2022-07-17 NOTE — Progress Notes (Unsigned)
Martha [name] D.[place] Phone: [phone]   Assessment and Plan:    1. Neck pain 2. DDD (degenerative disc disease), cervical -Chronic with exacerbation, initial sports medicine visit - 3+ months of neck pain that is worsening and causing significantly restricted ROM and limitations to daily activities with underlying DDD and facet arthropathy moderately and most advanced in C5-C7 - Patient has failed >6 weeks of conservative therapy with original visit for this issue in ER on 04/24/2022 and then PCP on 05/04/2022, failed conservative therapies including prednisone course, muscle relaxers, physical therapy, intermittent NSAID use, pain still frequently >6/10, and pain limiting day-to-day activities, and x-ray showing mild to moderate degenerative changes most prominent at C5/C7.  Because of this, I feel it is necessary to further evaluate with cervical spine MRI.  Ordered today. - Start meloxicam 15 mg daily x2 weeks.  If still having pain after 2 weeks, complete 3rd-week of meloxicam. May use remaining meloxicam as needed once daily for pain control.  Do not to use additional NSAIDs while taking meloxicam.  May use Tylenol [phone] mg 2 to 3 times a day for breakthrough pain. - Start HEP for neck and winged scapula    Pertinent previous records reviewed include ER note 04/16/2022, PCP note 05/04/2022, ER note 06/25/2022, physical therapy note 07/13/2022   Follow Up: 3 days after MRI to review results and discuss treatment plan.  Could consider epidural CSI   Subjective:   I, Martha [name], am serving as a Education administrator for Doctor Glennon Mac  Chief Complaint: neck pain   HPI:   04/24/2022 ED She has been started on physical therapy and has been seen by massage therapy.  Things had been doing better and she was putting her laundry away and suddenly felt a pop and sudden spasm of her neck.  She denies radiation to  the arms.  Denies numbness or weakness to the arms.  Denies overt trauma.   07/18/2022 Patient is a 61 year old female complaining of neck pain. Patient states that since July she had had constant headache and behind her ear and radiates down her right side . She has been started on physical therapy and has been seen by massage therapy.  Things had been doing better and she was putting her laundry away and suddenly felt a pop and sudden spasm of her neck.  She denies radiation to the arms.  Denies numbness or weakness to the arms.  Denies overt trauma.  PT suggested that it may be brought on by stress  Pain on the left side thoracic back pain sneezed and felt immediate pain no numbness or tingling or radiating pain   Relevant Historical Information: Hypothyroidism  Additional pertinent review of systems negative.   Current Outpatient Medications:    citalopram (CELEXA) 20 MG tablet, TAKE 1 TABLET BY MOUTH EVERY DAY, Disp: 90 tablet, Rfl: 0   cyclobenzaprine (FLEXERIL) 10 MG tablet, Take 1 tablet (10 mg total) by mouth 3 (three) times daily as needed for muscle spasms., Disp: 30 tablet, Rfl: 0   diclofenac Sodium (VOLTAREN) 1 % GEL, Apply 4 g topically 4 (four) times daily., Disp: 100 g, Rfl: 0   letrozole (FEMARA) 2.5 MG tablet, TAKE 1 TABLET BY MOUTH EVERY DAY, Disp: 90 tablet, Rfl: 3   levothyroxine (SYNTHROID) 75 MCG tablet, TAKE 1 TABLET BY MOUTH EVERY DAY BEFORE BREAKFAST, Disp: 90 tablet, Rfl: 0   meloxicam (MOBIC) 15 MG  tablet, Take 1 tablet (15 mg total) by mouth daily., Disp: 30 tablet, Rfl: 0   methylPREDNISolone (MEDROL DOSEPAK) 4 MG TBPK tablet, Day 1: '8mg'$  before breakfast, 4 mg after lunch, 4 mg after supper, and 8 mg at bedtime Day 2: 4 mg before breakfast, 4 mg after lunch, 4 mg  after supper, and 8 mg  at bedtime Day 3:  4 mg  before breakfast, 4 mg  after lunch, 4 mg after supper, and 4 mg  at bedtime Day 4: 4 mg  before breakfast, 4 mg  after lunch, and 4 mg at bedtime Day 5: 4  mg  before breakfast and 4 mg at bedtime Day 6: 4 mg  before breakfast, Disp: 1 each, Rfl: 0   oseltamivir (TAMIFLU) 75 MG capsule, Take 1 capsule (75 mg total) by mouth 2 (two) times daily., Disp: 20 capsule, Rfl: 0   OVER THE COUNTER MEDICATION, Take 1 tablet by mouth daily. Protandim Supplement, Disp: , Rfl:    Probiotic Product (PROBIOTIC PO), Take 1 capsule by mouth daily., Disp: , Rfl:    Objective:     Vitals:   07/18/22 1312  BP: 118/80  Pulse: (!) 108  SpO2: 100%  Weight: 103 lb (46.7 kg)  Height: '5\' 2"'$  (1.575 m)      Body mass index is 18.84 kg/m.    Physical Exam:    Cervical Spine: Posture normal Skin: normal, intact  Neurological:   Strength:  Right  Left   Deltoid (C5) 5/5 5/5  Bicep/Brachioradialis (C5/6) 5/5  5/5  Wrist Extension (C6) 5/5 5/5  Tricep (C7) 5/5 5/5  Wrist Flexion (C7) 5/5 5/5  Grip (C8) 5/5 5/5  Finger Abduction (T1) 5/5 5/5   Sensation: intact to light touch in upper extremities bilaterally  Spurling's:  negative bilaterally Neck ROM: Significantly limited active ROM in all directions TTP: cervical paraspinal, thoracic paraspinal, trapezius  NTTP: cervical spinous processes,  Winged right scapula  Electronically signed by:  Martha [name] D.[facility] 1:40 PM 07/18/22

## 2022-07-18 ENCOUNTER — Ambulatory Visit (HOSPITAL_BASED_OUTPATIENT_CLINIC_OR_DEPARTMENT_OTHER): Payer: BC Managed Care – PPO | Admitting: Physical Therapy

## 2022-07-18 ENCOUNTER — Encounter (HOSPITAL_BASED_OUTPATIENT_CLINIC_OR_DEPARTMENT_OTHER): Payer: BC Managed Care – PPO | Admitting: Physical Therapy

## 2022-07-18 ENCOUNTER — Ambulatory Visit (INDEPENDENT_AMBULATORY_CARE_PROVIDER_SITE_OTHER): Payer: BC Managed Care – PPO | Admitting: Sports Medicine

## 2022-07-18 ENCOUNTER — Ambulatory Visit: Payer: BC Managed Care – PPO | Admitting: Family Medicine

## 2022-07-18 VITALS — BP 118/80 | HR 108 | Ht 62.0 in | Wt 103.0 lb

## 2022-07-18 DIAGNOSIS — M5030 Other cervical disc degeneration, unspecified cervical region: Secondary | ICD-10-CM

## 2022-07-18 DIAGNOSIS — M542 Cervicalgia: Secondary | ICD-10-CM

## 2022-07-18 MED ORDER — MELOXICAM 15 MG PO TABS: 15.0000 mg | ORAL_TABLET | Freq: Every day | ORAL | 0 refills | Status: DC

## 2022-07-18 NOTE — Patient Instructions (Addendum)
Good to see you  - Start meloxicam 15 mg daily x2 weeks.  If still having pain after 2 weeks, complete 3rd-week of meloxicam. May use remaining meloxicam as needed once daily for pain control.  Do not to use additional NSAIDs while taking meloxicam.  May use Tylenol [phone] mg 2 to 3 times a day for breakthrough pain. Neck an scap HEP  MRI cervical  Follow up 3 days after MRI to discuss results

## 2022-07-22 ENCOUNTER — Other Ambulatory Visit: Payer: BC Managed Care – PPO

## 2022-07-24 ENCOUNTER — Ambulatory Visit (INDEPENDENT_AMBULATORY_CARE_PROVIDER_SITE_OTHER): Payer: BC Managed Care – PPO

## 2022-07-24 DIAGNOSIS — M5030 Other cervical disc degeneration, unspecified cervical region: Secondary | ICD-10-CM

## 2022-07-24 DIAGNOSIS — M542 Cervicalgia: Secondary | ICD-10-CM

## 2022-07-25 ENCOUNTER — Emergency Department (HOSPITAL_BASED_OUTPATIENT_CLINIC_OR_DEPARTMENT_OTHER)
Admission: EM | Admit: 2022-07-25 | Discharge: 2022-07-25 | Disposition: A | Discharge status: Home / Self Care | Payer: BC Managed Care – PPO | Attending: Emergency Medicine | Admitting: Emergency Medicine

## 2022-07-25 ENCOUNTER — Ambulatory Visit (INDEPENDENT_AMBULATORY_CARE_PROVIDER_SITE_OTHER): Payer: BC Managed Care – PPO | Admitting: Sports Medicine

## 2022-07-25 ENCOUNTER — Encounter (HOSPITAL_BASED_OUTPATIENT_CLINIC_OR_DEPARTMENT_OTHER): Payer: BC Managed Care – PPO | Admitting: Physical Therapy

## 2022-07-25 ENCOUNTER — Encounter (HOSPITAL_BASED_OUTPATIENT_CLINIC_OR_DEPARTMENT_OTHER): Payer: Self-pay | Admitting: Emergency Medicine

## 2022-07-25 ENCOUNTER — Other Ambulatory Visit: Payer: Self-pay

## 2022-07-25 DIAGNOSIS — Z170 Estrogen receptor positive status [ER+]: Secondary | ICD-10-CM | POA: Diagnosis not present

## 2022-07-25 DIAGNOSIS — C50312 Malignant neoplasm of lower-inner quadrant of left female breast: Secondary | ICD-10-CM | POA: Diagnosis not present

## 2022-07-25 DIAGNOSIS — S12100A Unspecified displaced fracture of second cervical vertebra, initial encounter for closed fracture: Secondary | ICD-10-CM | POA: Diagnosis not present

## 2022-07-25 DIAGNOSIS — C7951 Secondary malignant neoplasm of bone: Secondary | ICD-10-CM | POA: Diagnosis not present

## 2022-07-25 DIAGNOSIS — M542 Cervicalgia: Secondary | ICD-10-CM | POA: Diagnosis not present

## 2022-07-25 NOTE — Discharge Instructions (Signed)
You were seen today to have a c-collar placed.  Notably, your MRI report is concerning for malignancy and you will need to follow-up closely with neurosurgery and keep your neck immobilized until you are evaluated.

## 2022-07-25 NOTE — ED Triage Notes (Signed)
Pt arrives to ED sent by sports medicine for c-collar due to C2 dens base fracture and metastatic bone cancer found on MRI yesterday.

## 2022-07-25 NOTE — Progress Notes (Signed)
Martha [name] D.[place] Phone: [phone]    Virtual Visit Note  Assessment and Plan:     1. Malignant neoplasm of lower-inner quadrant of left breast in female, estrogen receptor positive ([place]) 2. Closed odontoid fracture, initial encounter ([place]) 3. Malignant neoplasm metastatic to bone [facility]) -Chronic with exacerbation, subsequent visit - Urgent telephone encounter was scheduled to discuss findings from C-spine MRI which was performed yesterday and called and reported to me today by Dr. Jorje Guild, radiology. - I called and spoke with patient and informed her of MRI findings which included extensive osseous metastatic disease with a pathologic fracture at base of dens and C2 right lateral mass, extraosseous tumor at C1 and C2, and degenerative changes from C4-C7 - Though patient does not have an acute trauma, and her pain has been ongoing for the past 3 months, with newly found dens fracture I recommend that patient be in a C-spine collar at all times for protection until she can be further evaluated by neurosurgery.  Patient went to [facility] supply, however there c-collar's were too big for her.  Our staff contacted other local medical supply stores, however none of them have pediatric c-collar's.  Patient was instructed that she could go to local urgent care or ER or order c-collar off of [place]. - Patient was previously treated by Dr. Truitt Merle, oncology for breast cancer.  We will refer back to Dr. Burr Medico who can help direct patient's oncology care - We will urgently refer patient to neurosurgery for further evaluation of dens fracture.  I recommend that she use c-collar for protection until that evaluation   Pertinent previous records reviewed include MRI C-spine 07/24/2022   I connected with patient by D[employer] video enabled telemedicine application and verified that I am speaking with the correct  person using two identifiers. Patient agreed to proceed via telephone. I discussed the limitations of evaluation and management by telemedicine and the availability of in person appointments. The patient expressed understanding and agreed to proceed.  Location: Patient: Home Provider: In office setting  Time of visit 28 minutes, which included telephone discussion, chart review, treatment plan discussion with patient, and documentation at today's telemedicine visit.    Follow Up: As needed if patient has questions for next steps in care   Subjective:    Chief Complaint: Neck pain  HPI:   07/25/22 Called and spoke with patient.  Patient was at home in her room alone, with her mother outside.  She states that she had no change in symptoms and no new trauma.  Relevant Historical Information: History of breast cancer, hypothyroidism  Additional pertinent review of systems negative.   Current Outpatient Medications:    citalopram (CELEXA) 20 MG tablet, TAKE 1 TABLET BY MOUTH EVERY DAY, Disp: 90 tablet, Rfl: 0   cyclobenzaprine (FLEXERIL) 10 MG tablet, Take 1 tablet (10 mg total) by mouth 3 (three) times daily as needed for muscle spasms., Disp: 30 tablet, Rfl: 0   diclofenac Sodium (VOLTAREN) 1 % GEL, Apply 4 g topically 4 (four) times daily., Disp: 100 g, Rfl: 0   letrozole (FEMARA) 2.5 MG tablet, TAKE 1 TABLET BY MOUTH EVERY DAY, Disp: 90 tablet, Rfl: 3   levothyroxine (SYNTHROID) 75 MCG tablet, TAKE 1 TABLET BY MOUTH EVERY DAY BEFORE BREAKFAST, Disp: 90 tablet, Rfl: 0   meloxicam (MOBIC) 15 MG tablet, Take 1 tablet (15 mg total) by mouth daily., Disp: 30 tablet,  Rfl: 0   methylPREDNISolone (MEDROL DOSEPAK) 4 MG TBPK tablet, Day 1: '8mg'$  before breakfast, 4 mg after lunch, 4 mg after supper, and 8 mg at bedtime Day 2: 4 mg before breakfast, 4 mg after lunch, 4 mg  after supper, and 8 mg  at bedtime Day 3:  4 mg  before breakfast, 4 mg  after lunch, 4 mg after supper, and 4 mg  at bedtime  Day 4: 4 mg  before breakfast, 4 mg  after lunch, and 4 mg at bedtime Day 5: 4 mg  before breakfast and 4 mg at bedtime Day 6: 4 mg  before breakfast, Disp: 1 each, Rfl: 0   oseltamivir (TAMIFLU) 75 MG capsule, Take 1 capsule (75 mg total) by mouth 2 (two) times daily., Disp: 20 capsule, Rfl: 0   OVER THE COUNTER MEDICATION, Take 1 tablet by mouth daily. Protandim Supplement, Disp: , Rfl:    Probiotic Product (PROBIOTIC PO), Take 1 capsule by mouth daily., Disp: , Rfl:    Objective:    Alert and doing well.  Very pleasant over the phone  Electronically signed by:  Martha [name] D.[facility] 10:54 AM 07/25/22

## 2022-07-25 NOTE — ED Notes (Signed)
Pt given discharge instructions. Opportunities given for questions. Pt verbalizes understanding. Dinara Lupu R, RN 

## 2022-07-25 NOTE — ED Provider Notes (Signed)
[facility] EMERGENCY DEPT Provider Note   CSN: [id number] Arrival date & time: 07/25/22  1233     History Chief Complaint  Patient presents with   Neck Pain    HPI Martha [name] is a 61 y.o. female presenting for fitting for c-collar.  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  Unfortunately she was unable to find a c-collar that fits her due to her small stature. She is when she comes to the ER to find a temporary collar.   Patient's recorded medical, surgical, social, medication list and allergies were reviewed in the Snapshot window as part of the initial history.   Review of Systems   Review of Systems  Constitutional:  Negative for chills and fever.  HENT:  Negative for ear pain and sore throat.   Eyes:  Negative for pain and visual disturbance.  Respiratory:  Negative for cough and shortness of breath.   Cardiovascular:  Negative for chest pain and palpitations.  Gastrointestinal:  Negative for abdominal pain and vomiting.  Genitourinary:  Negative for dysuria and hematuria.  Musculoskeletal:  Negative for arthralgias and back pain.  Skin:  Negative for color change and rash.  Neurological:  Negative for seizures and syncope.  All other systems reviewed and are negative.   Physical Exam Updated Vital Signs BP (!) 147/83 (BP Location: Right Arm)   Pulse (!) 109   Temp 97.8 F (36.6 C)   Resp 16   Ht '5\' 2"'$  (1.575 m)   Wt 46.7 kg   SpO2 99%   BMI 18.84 kg/m  Physical Exam Vitals and nursing note reviewed.  Constitutional:      General: She is not in acute distress.    Appearance: She is well-developed.  HENT:     Head: Normocephalic and atraumatic.  Eyes:     Conjunctiva/sclera: Conjunctivae normal.  Cardiovascular:     Rate and Rhythm: Normal rate and regular rhythm.     Heart sounds: No murmur heard. Pulmonary:      Effort: Pulmonary effort is normal. No respiratory distress.     Breath sounds: Normal breath sounds.  Abdominal:     Palpations: Abdomen is soft.     Tenderness: There is no abdominal tenderness.  Musculoskeletal:        General: No swelling.     Cervical back: Neck supple.  Skin:    General: Skin is warm and dry.     Capillary Refill: Capillary refill takes less than 2 seconds.  Neurological:     Mental Status: She is alert.  Psychiatric:        Mood and Affect: Mood normal.      ED Course/ Medical Decision Making/ A&P    Procedures Procedures   Medications Ordered in ED Medications - No data to display  Medical Decision Making:   Patient well-appearing.  Has no acute complaints.  Needs a c-collar fitted for cervical protection.  She has no neurologic abnormalities and denied any other etiology for presentation today. Evaluated patient.  Agree with prior diagnosis found on MRI.  Patient placed in c-collar, ambulatory tolerating p.o. intake discharged with no further acute events.  Clinical Impression:  1. Neck pain      Discharge   Final Clinical Impression(s) / ED Diagnoses Final diagnoses:  Neck pain    Rx / DC Orders ED Discharge Orders     None  Tretha Sciara, MD 07/25/22 1439

## 2022-07-25 NOTE — ED Notes (Signed)
Pt c/o neck pain. Applying cervical collar.

## 2022-07-26 ENCOUNTER — Telehealth: Payer: Self-pay | Admitting: Radiation Therapy

## 2022-07-26 ENCOUNTER — Other Ambulatory Visit: Payer: Self-pay | Admitting: Radiation Therapy

## 2022-07-26 DIAGNOSIS — C7951 Secondary malignant neoplasm of bone: Secondary | ICD-10-CM | POA: Diagnosis not present

## 2022-07-26 DIAGNOSIS — S12191A Other nondisplaced fracture of second cervical vertebra, initial encounter for closed fracture: Secondary | ICD-10-CM | POA: Diagnosis not present

## 2022-07-26 NOTE — Telephone Encounter (Signed)
Dr. Duffy Rhody reached out about getting Martha [name] in to see Martha [name] next week to discuss treatment options for the newly diagnosed metastatic disease in her cervical spine. We will discuss next steps in conference on Monday 12/4, and Martha [name] will see her on 12/5. Martha [name] is very happy with this plan.   Mont Dutton R.T.(R)(T) Radiation Special Procedures Navigator

## 2022-07-27 ENCOUNTER — Encounter (HOSPITAL_BASED_OUTPATIENT_CLINIC_OR_DEPARTMENT_OTHER): Payer: BC Managed Care – PPO | Admitting: Physical Therapy

## 2022-07-27 ENCOUNTER — Ambulatory Visit: Payer: BC Managed Care – PPO | Admitting: Sports Medicine

## 2022-07-28 ENCOUNTER — Telehealth: Payer: Self-pay | Admitting: Radiation Therapy

## 2022-07-28 NOTE — Progress Notes (Signed)
Histology and Location of Primary Cancer:  Lobular carcinoma in situ (LCIS) of right breast (2021) Malignant neoplasm of lower-inner quadrant of left breast in female, estrogen receptor positiv (2019)  Sites of Visceral and Bony Metastatic Disease:  MRI Spine w/o Contrast 07/24/2022 --IMPRESSION: Extensive osseous metastatic disease with pathologic fracture at the base of dens and C2 right lateral mass. Extraosseous tumor at C1 and C2 likely impinging on the right C2 and C3 nerve roots. Degenerative cord impingement at C4-5 to C6-7. Biforaminal impingement at C5-6 and C6-7.  Past/Anticipated chemotherapy by medical oncology, if any:  Under care of Dr. Truitt Merle Scheduled to see 08/04/2022   Pain on a scale of 0-10 is: 3/10 ribs and back  If Spine Met(s), symptoms, if any, include: Bowel/Bladder retention or incontinence (please describe): yes, in the evening need to go urgently Numbness or weakness in extremities (please describe): no Current Decadron regimen, if applicable: N/A--not currently precribed  Ambulatory status? Walker? Wheelchair?: none at this tme  SAFETY ISSUES: Prior radiation? Yes: 04/22/2018 - 06/03/2018  Left Breast / 50 Gy in 25 fractions Left SCV, axilla nodes / 50 Gy in 25 fractions Left Breast Boost / 10 Gy in 5 fractions Pacemaker/ICD? no Possible current pregnancy? No--postmenopausal Is the patient on methotrexate? no  Current Complaints / other details:  c-collar in place since Tuesday of last week,  Need to know about scheduling pet, mri and ct.   Vitals:   08/01/22 1346  BP: (!) 140/74  Pulse: 93  Resp: 18  Temp: 98.1 F (36.7 C)  SpO2: 100%

## 2022-07-28 NOTE — Telephone Encounter (Signed)
Called to let pt know she qualifies for genetic testing if interested. There was no answer, so I left a detailed message for pt including my contact info to call back.   Mont Dutton R.T.(R)(T)  Radiation Special Procedures Navigator

## 2022-07-31 ENCOUNTER — Other Ambulatory Visit: Payer: Self-pay | Admitting: Radiation Therapy

## 2022-07-31 ENCOUNTER — Other Ambulatory Visit (HOSPITAL_COMMUNITY): Payer: Self-pay | Admitting: Neurosurgery

## 2022-07-31 ENCOUNTER — Telehealth: Payer: Self-pay | Admitting: Genetic Counselor

## 2022-07-31 DIAGNOSIS — S12191A Other nondisplaced fracture of second cervical vertebra, initial encounter for closed fracture: Secondary | ICD-10-CM

## 2022-07-31 NOTE — Progress Notes (Signed)
Radiation Oncology         [phone] ________________________________  Outpatient Re-Consultation  Name: [name] MRN: [id number]  Date: 08/01/2022  DOB: [birth year]  [id number], [name], MD  Vallarie Mare, MD   REFERRING PHYSICIAN: Vallarie Mare, MD  DIAGNOSIS:    ICD-10-CM   1. Metastatic cancer to spine ([place])  C79.51     2. Urinary urgency  R39.15 Urinalysis, Routine w reflex microscopic    CANCELED: Urinalysis, Complete w Microscopic    3. Screening for hypothyroidism  Z13.29 TSH    4. Cancer, metastatic to bone [facility])  C79.51      Extensive osseous metastatic disease throughout the cervical spine, most notably at C2   Cancer Staging  Malignant neoplasm of lower-inner quadrant of left breast in female, estrogen receptor positive ([place]) Staging form: Breast, AJCC 8th Edition - Clinical stage from 01/16/2018: Stage IA (cT1c, cN0, cM0, G2, ER+, PR+, HER2-) - Signed by Truitt Merle, MD on 01/23/2018 Histologic grading system: 3 grade system - Pathologic: Stage IA (pT1c, pN1, cM0, G1, ER+, PR+, HER2-) - Signed by Eppie Gibson, MD on 04/09/2018 Histologic grading system: 3 grade system   CHIEF COMPLAINT: Here to discuss management of metastatic left breast cancer  HISTORY OF PRESENT ILLNESS::Martha [name] is a 61 y.o. female who returns today for re-evaluation and for consideration of radiation therapy in management of her recently diagnosed osseous metastatic disease from breast cancer primary. I last met with the the patient on 07/12/2018 for follow-up of radiation therapy to the left breast and axilla.   Since she was last seen, the patient was diagnosed with right breast LCIS in July 2021, s/p lumpectomy with Dr. Barry Dienes on 03/18/2020. Later in 2021, she was found to have 2 new lesions in the lower outer and lower inner right breast. These were biopsied and showed no evidence of malignancy, but findings consistent with fibroadenoma from one specimen,  and focal atypical ductal hyperplasia in a complex sclerosing lesion from the other specimen. Repeat diagnostic right breast mammogram performed on 01/20/21 showed an increase in size of the outer right breast calcifications spanning 2.3 cm. Subsequently, the patient was referred back to Dr. Barry Dienes and underwent lumpectomies to the right upper outer and right lower inner breast on 04/28/21. Pathology showed extensive pleomorphic lobular carcinoma in-situ involving sclerosing adenosis in the upper outer right breast, and a complex sclerosing lesion with usual ductal hyperplasia in the lower inner right breast.   In recent history, the patient presented to Dr. Glennon Mac at [facility] on 07/18/22 with the cc of neck pain and a radiating right sided headache for the past 4 months (since July).  She was prescribed meloxicam 15 mg daily for 2 weeks by sports medicine.   Before seeking evaluation by sports medicine, the patient was also seen by PT and massage therapy for her pain with some relief. She however had several instances of painful neck spasms following certain movements during that time.   MRI of the cervical spine without contrast ordered by Dr. Glennon Mac on 07/24/22 unfortunately revealed evidence of extensive osseous metastatic disease throughout the cervical spine. Most notably, a very large deposit is seen at C2 with pathologic fracture through the base of the dens. MRI also showed: extensive involvement of C2 by a right lateral mass; extraosseous tumor at C1 and C2 likely causing impingement on the right C2 and C3 nerve roots; degenerative cord impingement at C4-5 and C6-7; and bi-foraminal impingement at C5-6  and C6-7.   Given the above findings (specifically the dens fracture), Dr. Glennon Mac advised the patient to obtain a cervical collar for protection until she can be evaluated by neurosurgery. However, the patient encountered issues findings a properly sized collar on her own given her  small/petite stature. This prompted her to present to the ED for c-collar fitting on 07/25/22,  Dr. Glennon Mac has also referred the patient back to Dr. Burr Medico who followed the patient in 2019 for her left breast cancer. She is scheduled to meet with Dr. Burr Medico for re-evaluation on 08/04/22.  Of note: the patient had an x-ray of the cervical spine performed on 05/04/22 for evaluation of her neck pain which showed findings consistent with cervical spondylosis and nonspecific straightening of the expected cervical lordosis.      Today she is in her cervical collar which she states has helped alleviate some pain. She has tried steroids and muscle relaxers which have not helped with the pain. She also reports a 2 week history of some mid-paraspinal pain that is present on both sides of her spine. She has tried wrapping her a brace around her back which has provided little pain relief. She has also been experiencing urinary urgency at night. This has been going on for about 2 weeks as well. She denies any incontinence or dysuria.  No extremity numbness or weakness.  PREVIOUS RADIATION THERAPY: Yes, under the care of myself in 2019 for left breast cancer  Indication for treatment:  Curative      Radiation treatment dates:   04/22/2018 - 06/03/2018 Site/dose:    1. Left Breast / 50 Gy in 25 fractions 2. Left SCV, axilla nodes / 50 Gy in 25 fractions 3. Left Breast Boost / 10 Gy in 5 fractions Beams/energy:    1. 3D / 6X Photon 2. 3D / 10X, 6X Photon 3. isodose / 6X Photon  PAST MEDICAL HISTORY:  has a past medical history of Anxiety, Cancer ([place]) (2022), Cancer ([place]) (2019), Depression, Dysrhythmia, History of radiation therapy (04/22/18-06/03/18), Hypothyroidism, Personal history of radiation therapy, PONV (postoperative nausea and vomiting), and Thyroid disease.    PAST SURGICAL HISTORY: Past Surgical History:  Procedure Laterality Date   APPENDECTOMY     BREAST BIOPSY Right 2014   fibroadenoma    BREAST BIOPSY Left 01/16/2018   BREAST BIOPSY Right 02/02/2020   LCIS   BREAST BIOPSY Right 08/04/2020   x2 LCIS   BREAST BIOPSY Right 08/13/2020   x2   BREAST EXCISIONAL BIOPSY Left 1990   benign   BREAST EXCISIONAL BIOPSY Right 02/02/2020   LCIS   BREAST LUMPECTOMY Left 01/22/2018   BREAST LUMPECTOMY WITH AXILLARY LYMPH NODE BIOPSY Left 02/21/2018   Procedure: LEFT BREAST LUMPECTOMY WITH AXILLARY LYMPH NODE BIOPSY;  Surgeon: [name], MD;  Location: [facility];  Service: General;  Laterality: Left;   BREAST LUMPECTOMY WITH RADIOACTIVE SEED LOCALIZATION Right 03/18/2020   Procedure: RIGHT BREAST LUMPECTOMY WITH RADIOACTIVE SEED LOCALIZATION;  Surgeon: [name], MD;  Location: [facility];  Service: General;  Laterality: Right;   BREAST SURGERY     ENDOMETRIAL ABLATION     EYE SURGERY     GLAUCOMA SURGERY Bilateral    RADIOACTIVE SEED GUIDED EXCISIONAL BREAST BIOPSY Right 04/28/2021   Procedure: RADIOACTIVE SEED GUIDED EXCISIONAL RIGHT BREAST BIOPSY X2;  Surgeon: [name], MD;  Location: [facility];  Service: General;  Laterality: Right;   RE-EXCISION OF BREAST LUMPECTOMY Left 03/20/2018   Procedure: RE-EXCISION OF BREAST LUMPECTOMY;  Surgeon: [name], MD;  Location: [facility];  Service: General;  Laterality: Left;    FAMILY HISTORY: family history includes Heart disease in her mother; Hyperlipidemia in her sister.  SOCIAL HISTORY:  reports that she has been smoking cigarettes. She has a 40.00 pack-year smoking history. She has never used smokeless tobacco. She reports current alcohol use of about 40.0 standard drinks of alcohol per week. She reports that she does not use drugs.  ALLERGIES: Morphine and related  MEDICATIONS:  Current Outpatient Medications  Medication Sig Dispense Refill   citalopram (CELEXA) 20 MG tablet TAKE 1 TABLET BY MOUTH EVERY DAY 90 tablet 0   letrozole (FEMARA) 2.5 MG tablet TAKE 1 TABLET BY MOUTH EVERY DAY 90 tablet  3   levothyroxine (SYNTHROID) 75 MCG tablet TAKE 1 TABLET BY MOUTH EVERY DAY BEFORE BREAKFAST 90 tablet 0   meloxicam (MOBIC) 15 MG tablet Take 1 tablet (15 mg total) by mouth daily. 30 tablet 0   OVER THE COUNTER MEDICATION Take 1 tablet by mouth daily. Protandim Supplement     Probiotic Product (PROBIOTIC PO) Take 1 capsule by mouth daily.     cyclobenzaprine (FLEXERIL) 10 MG tablet Take 1 tablet (10 mg total) by mouth 3 (three) times daily as needed for muscle spasms. (Patient not taking: Reported on 08/01/2022) 30 tablet 0   diclofenac Sodium (VOLTAREN) 1 % GEL Apply 4 g topically 4 (four) times daily. (Patient not taking: Reported on 08/01/2022) 100 g 0   methylPREDNISolone (MEDROL DOSEPAK) 4 MG TBPK tablet Day 1: 25m before breakfast, 4 mg after lunch, 4 mg after supper, and 8 mg at bedtime Day 2: 4 mg before breakfast, 4 mg after lunch, 4 mg  after supper, and 8 mg  at bedtime Day 3:  4 mg  before breakfast, 4 mg  after lunch, 4 mg after supper, and 4 mg  at bedtime Day 4: 4 mg  before breakfast, 4 mg  after lunch, and 4 mg at bedtime Day 5: 4 mg  before breakfast and 4 mg at bedtime Day 6: 4 mg  before breakfast (Patient not taking: Reported on 08/01/2022) 1 each 0   oseltamivir (TAMIFLU) 75 MG capsule Take 1 capsule (75 mg total) by mouth 2 (two) times daily. (Patient not taking: Reported on 08/01/2022) 20 capsule 0   No current facility-administered medications for this encounter.    REVIEW OF SYSTEMS:  Notable for that above.   PHYSICAL EXAM:  height is _0  (1.575 m) and weight is 104 lb (47.2 kg). Her temperature is 98.1 F (36.7 C). Her blood pressure is 140/74 (abnormal) and her pulse is 93. Her respiration is 18 and oxygen saturation is 100%.   General: Alert and oriented, in no acute distress  HEENT: Head is normocephalic. Extraocular movements are intact. Oropharynx is clear. Neck: Neck is supple, no palpable cervical or supraclavicular lymphadenopathy. Heart: Regular in rate and  rhythm with no murmurs, rubs, or gallops. Chest: Clear to auscultation bilaterally, with no rhonchi, wheezes, or rales. Abdomen: Soft, nontender, nondistended, with no rigidity or guarding. Extremities: No cyanosis or edema. Lymphatics: see Neck Exam Skin: No concerning lesions. Musculoskeletal: symmetric strength and muscle tone throughout. No tenderness to palpation along the spine. Slight tenderness to palpation along the paraspinal muscles at the mid-back.  Neurologic: Cranial nerves II through XII are grossly intact. No obvious focalities. Speech is fluent. Coordination is intact. Psychiatric: Judgment and insight are intact. Affect is appropriate.   ECOG =  1  0 - Asymptomatic (Fully active, able to carry on all predisease activities without restriction)  1 - Symptomatic but completely ambulatory (Restricted in physically strenuous activity but ambulatory and able to carry out work of a light or sedentary nature. For example, light housework, office work)  2 - Symptomatic, <50% in bed during the day (Ambulatory and capable of all self care but unable to carry out any work activities. Up and about more than 50% of waking hours)  3 - Symptomatic, >50% in bed, but not bedbound (Capable of only limited self-care, confined to bed or chair 50% or more of waking hours)  4 - Bedbound (Completely disabled. Cannot carry on any self-care. Totally confined to bed or chair)  5 - Death   Eustace Pen MM, Creech RH, Tormey DC, et al. [phone]). "Toxicity and response criteria of the [facility]". [place] Oncol. 5 (6): 649-55   LABORATORY DATA:  Lab Results  Component Value Date   WBC 6.4 04/12/2021   HGB 14.6 04/12/2021   HCT 42.5 04/12/2021   MCV 97.2 04/12/2021   PLT 210.0 04/12/2021   CMP     Component Value Date/Time   NA 138 04/12/2021 1348   K 4.0 04/12/2021 1348   CL 100 04/12/2021 1348   CO2 28 04/12/2021 1348   GLUCOSE 91 04/12/2021 1348   BUN 10  04/12/2021 1348   CREATININE 0.61 04/12/2021 1348   CREATININE 0.74 05/21/2020 1350   CALCIUM 9.8 04/12/2021 1348   PROT 7.3 04/12/2021 1348   ALBUMIN 4.4 04/12/2021 1348   AST 19 04/12/2021 1348   AST 16 05/21/2020 1350   ALT 14 04/12/2021 1348   ALT 16 05/21/2020 1350   ALKPHOS 84 04/12/2021 1348   BILITOT 0.5 04/12/2021 1348   BILITOT 0.6 05/21/2020 1350   GFRNONAA >60 05/21/2020 1350   GFRAA >60 05/21/2020 1350      Lab Results  Component Value Date   TSH 1.364 08/01/2022       RADIOGRAPHY: MR CERVICAL SPINE WO CONTRAST  Result Date: 07/25/2022 CLINICAL DATA:  Neck pain.  Degenerative disc disease. EXAM: MRI CERVICAL SPINE WITHOUT CONTRAST TECHNIQUE: Multiplanar, multisequence MR imaging of the cervical spine was performed. No intravenous contrast was administered. COMPARISON:  None Available. FINDINGS: Alignment: Straightening of cervical lordosis. Vertebrae: Marrow replacing lesions seen throughout the covered cervical and upper thoracic spine. Very large deposit at C2 with pathologic fracture through the base of dens. Extensive involvement of the C2 right lateral mass which is likely also fractured with extra osseous tumor. Extraosseous tumor ventrally through the right lateral mass of C1. Cord: Normal signal.  Cord compression described below. Posterior Fossa, vertebral arteries, paraspinal tissues: Extraosseous tumor around the C1 and C2. Prevertebral edema in the setting of fracture. Disc levels: C2-3: Suspect tumor infiltrating the right foramen foramen. Tumor could also extends superior to the right lateral mass potentially affecting the right C2 nerve root. C3-4: Unremarkable. C4-5: Disc bulging with downward pointing extrusion indenting the ventral cord. C5-6: Disc collapse with endplate and uncovertebral ridging causing biforaminal impingement and cord flattening. C6-7: Disc collapse with disc bulging and uncovertebral ridging causing biforaminal impingement and cord  flattening. C7-T1:Mild facet spurring These results were called by telephone at the time of interpretation on 07/25/2022 at 9:49 am to provider [facility] , who verbally acknowledged these results. IMPRESSION: 1. Extensive osseous metastatic disease with pathologic fracture at the base of dens and C2 right lateral mass. Extraosseous tumor at C1  and C2 likely impinging on the right C2 and C3 nerve roots. 2. Degenerative cord impingement at C4-5 to C6-7. Biforaminal impingement at C5-6 and C6-7. Electronically Signed   By: Jorje Guild M.D.   On: 07/25/2022 09:49      IMPRESSION/PLAN:  Extensive osseous metastatic disease throughout the cervical spine, most notably at C2  Patient's case was discussed in depth at our tumor conference on 07/31/22. It was agreed upon that a PET scan is necessary to determine extent of disease spread at this time. If metastasis were found elsewhere, biopsy of these lesions would be useful in determining systemic treatment options. We discussed that surgical stabilization of the cervical spine may provide benefit to the patient. I recommend palliative radiation therapy in 10 fractions to the osseous metastasis at the cervical spine following surgery to help with symptomatic relief. NSU referral is pending - we'll discuss PET results at tumor board next week to determine if surgery is in fact indicated.  Potential risks, side effects, and benefits of RT discussed in detail. Consent signed today.  New onset back pain and urinary urgency  Getting UA today to rule out infectious cause. Patient denies incontinence, dysuria, or fevers. PET will reveal if extensive disease is causing pain.   All questions were answered today and the patient is enthusiastic about going forward with the treatment plan we discussed. PET scan is scheduled for 08/03/22.   On date of service, in total, I spent 60 minutes on this encounter. Patient was seen in person. Note signed after date of  service - minutes pertain to service date only. __________________________________________   Leona Singleton, PA   Eppie Gibson, MD  This document serves as a record of services personally performed by Eppie Gibson, MD. It was created on her behalf by Roney Mans, a trained medical scribe. The creation of this record is based on the scribe's personal observations and the provider's statements to them. This document has been checked and approved by the attending provider.

## 2022-07-31 NOTE — Telephone Encounter (Signed)
LM on VM that I had called regarding her questions on the cost for genetic testing.  Left CB number and instructions.

## 2022-08-01 ENCOUNTER — Other Ambulatory Visit: Payer: Self-pay

## 2022-08-01 ENCOUNTER — Other Ambulatory Visit: Payer: Self-pay | Admitting: Radiation Therapy

## 2022-08-01 ENCOUNTER — Ambulatory Visit
Admission: RE | Admit: 2022-08-01 | Discharge: 2022-08-01 | Disposition: A | Discharge status: Home / Self Care | Payer: BC Managed Care – PPO | Source: Ambulatory Visit | Attending: Radiation Oncology | Admitting: Radiation Oncology

## 2022-08-01 ENCOUNTER — Encounter: Payer: Self-pay | Admitting: Radiation Oncology

## 2022-08-01 VITALS — BP 140/74 | HR 93 | Temp 98.1°F | Resp 18 | Ht 62.0 in | Wt 104.0 lb

## 2022-08-01 DIAGNOSIS — M50221 Other cervical disc displacement at C4-C5 level: Secondary | ICD-10-CM | POA: Insufficient documentation

## 2022-08-01 DIAGNOSIS — C7951 Secondary malignant neoplasm of bone: Secondary | ICD-10-CM

## 2022-08-01 DIAGNOSIS — C7931 Secondary malignant neoplasm of brain: Secondary | ICD-10-CM | POA: Diagnosis not present

## 2022-08-01 DIAGNOSIS — F17210 Nicotine dependence, cigarettes, uncomplicated: Secondary | ICD-10-CM | POA: Insufficient documentation

## 2022-08-01 DIAGNOSIS — Z170 Estrogen receptor positive status [ER+]: Secondary | ICD-10-CM | POA: Insufficient documentation

## 2022-08-01 DIAGNOSIS — R3915 Urgency of urination: Secondary | ICD-10-CM | POA: Diagnosis not present

## 2022-08-01 DIAGNOSIS — Z1329 Encounter for screening for other suspected endocrine disorder: Secondary | ICD-10-CM | POA: Diagnosis not present

## 2022-08-01 DIAGNOSIS — E039 Hypothyroidism, unspecified: Secondary | ICD-10-CM | POA: Diagnosis not present

## 2022-08-01 DIAGNOSIS — C50312 Malignant neoplasm of lower-inner quadrant of left female breast: Secondary | ICD-10-CM | POA: Diagnosis not present

## 2022-08-01 DIAGNOSIS — Z7952 Long term (current) use of systemic steroids: Secondary | ICD-10-CM | POA: Diagnosis not present

## 2022-08-01 DIAGNOSIS — C50512 Malignant neoplasm of lower-outer quadrant of left female breast: Secondary | ICD-10-CM | POA: Insufficient documentation

## 2022-08-01 DIAGNOSIS — Z79811 Long term (current) use of aromatase inhibitors: Secondary | ICD-10-CM | POA: Diagnosis not present

## 2022-08-01 DIAGNOSIS — Z79899 Other long term (current) drug therapy: Secondary | ICD-10-CM | POA: Diagnosis not present

## 2022-08-01 DIAGNOSIS — Z791 Long term (current) use of non-steroidal anti-inflammatories (NSAID): Secondary | ICD-10-CM | POA: Diagnosis not present

## 2022-08-01 DIAGNOSIS — M549 Dorsalgia, unspecified: Secondary | ICD-10-CM | POA: Insufficient documentation

## 2022-08-01 LAB — URINALYSIS, ROUTINE W REFLEX MICROSCOPIC
Bilirubin Urine: NEGATIVE
Glucose, UA: NEGATIVE mg/dL
Hgb urine dipstick: NEGATIVE
Ketones, ur: 20 mg/dL — AB
Nitrite: NEGATIVE
Protein, ur: NEGATIVE mg/dL
Specific Gravity, Urine: 1.017 (ref 1.005–1.030)
pH: 5 (ref 5.0–8.0)

## 2022-08-01 LAB — TSH: TSH: 1.364 u[IU]/mL (ref 0.350–4.500)

## 2022-08-02 ENCOUNTER — Other Ambulatory Visit: Payer: Self-pay | Admitting: Genetic Counselor

## 2022-08-02 ENCOUNTER — Encounter: Payer: Self-pay | Admitting: Radiation Oncology

## 2022-08-02 ENCOUNTER — Inpatient Hospital Stay: Payer: BC Managed Care – PPO | Attending: Genetic Counselor | Admitting: Genetic Counselor

## 2022-08-02 ENCOUNTER — Inpatient Hospital Stay: Payer: BC Managed Care – PPO

## 2022-08-02 ENCOUNTER — Encounter: Payer: Self-pay | Admitting: Genetic Counselor

## 2022-08-02 DIAGNOSIS — C50312 Malignant neoplasm of lower-inner quadrant of left female breast: Secondary | ICD-10-CM

## 2022-08-02 DIAGNOSIS — Z79811 Long term (current) use of aromatase inhibitors: Secondary | ICD-10-CM | POA: Diagnosis not present

## 2022-08-02 DIAGNOSIS — Z803 Family history of malignant neoplasm of breast: Secondary | ICD-10-CM

## 2022-08-02 DIAGNOSIS — C7951 Secondary malignant neoplasm of bone: Secondary | ICD-10-CM | POA: Insufficient documentation

## 2022-08-02 DIAGNOSIS — Z808 Family history of malignant neoplasm of other organs or systems: Secondary | ICD-10-CM

## 2022-08-02 DIAGNOSIS — Z170 Estrogen receptor positive status [ER+]: Secondary | ICD-10-CM

## 2022-08-02 LAB — CBC WITH DIFFERENTIAL (CANCER CENTER ONLY)
Abs Immature Granulocytes: 0.04 10*3/uL (ref 0.00–0.07)
Basophils Absolute: 0 10*3/uL (ref 0.0–0.1)
Basophils Relative: 1 %
Eosinophils Absolute: 0 10*3/uL (ref 0.0–0.5)
Eosinophils Relative: 0 %
HCT: 41.7 % (ref 36.0–46.0)
Hemoglobin: 14.5 g/dL (ref 12.0–15.0)
Immature Granulocytes: 1 %
Lymphocytes Relative: 11 %
Lymphs Abs: 0.7 10*3/uL (ref 0.7–4.0)
MCH: 33 pg (ref 26.0–34.0)
MCHC: 34.8 g/dL (ref 30.0–36.0)
MCV: 94.8 fL (ref 80.0–100.0)
Monocytes Absolute: 0.4 10*3/uL (ref 0.1–1.0)
Monocytes Relative: 7 %
Neutro Abs: 5.3 10*3/uL (ref 1.7–7.7)
Neutrophils Relative %: 80 %
Platelet Count: 223 10*3/uL (ref 150–400)
RBC: 4.4 MIL/uL (ref 3.87–5.11)
RDW: 13.3 % (ref 11.5–15.5)
WBC Count: 6.6 10*3/uL (ref 4.0–10.5)
nRBC: 0 % (ref 0.0–0.2)

## 2022-08-02 LAB — CMP (CANCER CENTER ONLY)
ALT: 18 U/L (ref 0–44)
AST: 30 U/L (ref 15–41)
Albumin: 4.6 g/dL (ref 3.5–5.0)
Alkaline Phosphatase: 241 U/L — ABNORMAL HIGH (ref 38–126)
Anion gap: 6 (ref 5–15)
BUN: 10 mg/dL (ref 8–23)
CO2: 30 mmol/L (ref 22–32)
Calcium: 10.4 mg/dL — ABNORMAL HIGH (ref 8.9–10.3)
Chloride: 101 mmol/L (ref 98–111)
Creatinine: 0.52 mg/dL (ref 0.44–1.00)
GFR, Estimated: >60 mL/min (ref >60)
Glucose, Bld: 109 mg/dL — ABNORMAL HIGH (ref 70–99)
Potassium: 3.9 mmol/L (ref 3.5–5.1)
Sodium: 137 mmol/L (ref 135–145)
Total Bilirubin: 0.5 mg/dL (ref 0.3–1.2)
Total Protein: 7.4 g/dL (ref 6.5–8.1)

## 2022-08-02 LAB — GENETIC SCREENING ORDER

## 2022-08-02 NOTE — Progress Notes (Signed)
REFERRING PROVIDER: [name], MD [address] [place],  [place] [address]  PRIMARY PROVIDER:  [name], MD  PRIMARY REASON FOR VISIT:  1. Family history of melanoma   2. Family history of breast cancer   3. Malignant neoplasm of lower-inner quadrant of left breast in female, estrogen receptor positive ([place])      HISTORY OF PRESENT ILLNESS:   Ms. Arai, a 61 y.o. female, was seen for a [place] cancer genetics consultation at the request of Dr. [name] due to a personal and family history of breast cancer.  Ms. Macfarlane presents to clinic today to discuss the possibility of a hereditary predisposition to cancer, genetic testing, and to further clarify her future cancer risks, as well as potential cancer risks for family members.   In 2019, at the age of 4, Ms. Rhine was diagnosed with cancer of the left breast. The treatment plan included lumpectomy and radiation.  She was diagnosed again two years later at age 39.  Ms. Cressy recently was found to have metastatic breast cancer.      CANCER HISTORY:  Oncology History Overview Note  Cancer Staging Malignant neoplasm of lower-inner quadrant of left breast in female, estrogen receptor positive ([place]) Staging form: Breast, AJCC 8th Edition - Clinical stage from 01/16/2018: Stage IA (cT1c, cN0, cM0, G2, ER+, PR+, HER2-) - Signed by Truitt Merle, MD on 01/23/2018 - Pathologic: Stage IA (pT1c, pN1, cM0, G1, ER+, PR+, HER2-) - Signed by Eppie Gibson, MD on 04/09/2018     Malignant neoplasm of lower-inner quadrant of left breast in female, estrogen receptor positive ([place])  01/15/2018 Mammogram   Diagnositc Mammogram 01/15/18  IMPRESSION: 1. Suspicious 1.2 x 1.4 x 1.3 cm mixed echogenicity mass left breast 7 o'clock position retroareolar location at the site of palpable concern.. 2. Indeterminate Within the left breast 7:30 o'clock retroareolar location, adjacent to the palpable mass, is a 0.5 x 0.4 x 0.5 cm oval  circumscribed hypoechoic mass. 3. Indeterminate calcifications within the lateral left breast. Location of these calcifications is not definitely confirmed on the true lateral view.    01/16/2018 Initial Biopsy   Diagnosis 01/16/18 1. Breast, left, needle core biopsy, 7:30 o'clock (ribbon clip) - FIBROCYSTIC CHANGES WITH SCLEROSING ADENOSIS AND CALCIFICATIONS. - FIBROADENOMATOID CHANGE. - NO MALIGNANCY IDENTIFIED. 2. Breast, left, needle core biopsy, 7 o'clock position (coil clip) - INVASIVE MAMMARY CARCINOMA, MSBR GRADE I/II. - SEE MICROSCOPIC DESCRIPTION Microscopic Comment  ADDENDUM: Immunohistochemistry for E-Cadherin is strongly positive in the tumor consistent with ductal carcinoma. (JDP:ah 01/17/18)   01/16/2018 Receptors her2   Estrogen Receptor: 100%, POSITIVE, STRONG STAINING INTENSITY Progesterone Receptor: 50%, POSITIVE, STRONG STAINING INTENSITY Proliferation Marker Ki67: 20% HER2 Negative   01/16/2018 Cancer Staging   Staging form: Breast, AJCC 8th Edition - Clinical stage from 01/16/2018: Stage IA (cT1c, cN0, cM0, G2, ER+, PR+, HER2-) - Signed by Truitt Merle, MD on 01/23/2018   01/22/2018 Initial Diagnosis   Malignant neoplasm of lower-inner quadrant of left breast in female, estrogen receptor positive ([place])   02/21/2018 Surgery    LEFT BREAST LUMPECTOMY WITH AXILLARY LYMPH NODE BIOPSY by Dr. Barry Dienes  02/21/18   02/21/2018 Pathology Results   Diagnosis 02/21/18 1. Breast, lumpectomy, Left - INVASIVE DUCTAL CARCINOMA, GRADE I, 1.6 CM. - DUCTAL CARCINOMA IN SITU, INTERMEDIATE NUCLEAR GRADE. - ANTERIOR AND MEDIAL RESECTION MARGINS ARE POSITIVE FOR CARCINOMA. - NEGATIVE FOR LYMPHOVASCULAR OR PERINEURAL INVASION. - BACKGROUND BREAST TISSUE WITH FIBROCYSTIC CHANGE, INCLUDING SCLEROSING ADENOSIS. - BIOPSY SITE CHANGES. -  SEE ONCOLOGY TABLE. 2. Lymph node, sentinel, biopsy, Left Axillary #1 - METASTATIC BREAST CARCINOMA TO A LYMPH NODE, 1.0 CM IN GREATEST DIMENSION, WITH  EXTRANODAL EXTENSION (1/1). 3. Lymph node, sentinel, biopsy, Left Axillary #2 - LYMPH NODE, NEGATIVE FOR CARCINOMA (0/1).    02/21/2018 Miscellaneous   Mammaprint 02/21/18 Low Risk with 10-year risk of recurrnce at 10% -No potential signifcant chemotherapy benefit   03/20/2018 Pathology Results   RE-EXCISION OF BREAST LUMPECTOMY by Dr. Barry Dienes  Diagnosis 03/20/18 1. Breast, excision, Left new anterior margin - FIBROCYSTIC CHANGES WITH ADENOSIS AND CALCIFICATIONS. - HEALING BIOPSY SITE. - THERE IS NO EVIDENCE OF MALIGNANCY. 2. Breast, excision, Left new medial margin - FIBROCYSTIC CHANGES WITH ADENOSIS AND CALCIFICATIONS. - HEALING BIOPSY SITE. - THERE IS NO EVIDENCE OF MALIGNANCY. Microscopic Comment 1. -2. The surgical resection margin(s) of the specimen were inked and microscopically evaluated. (JBK:kh 03-22-18)   04/09/2018 Cancer Staging   Staging form: Breast, AJCC 8th Edition - Pathologic: Stage IA (pT1c, pN1, cM0, G1, ER+, PR+, HER2-) - Signed by Eppie Gibson, MD on 04/09/2018   04/22/2018 - 06/03/2018 Radiation Therapy   Radaiton with Dr. Isidore Moos 04/22/18-06/03/18   05/2018 -  Anti-estrogen oral therapy   Letrozole 2.17m started 05/2018    Survivorship   Per LCira Rue NP    Lobular carcinoma in situ (LCIS) of right breast  01/20/2020 Mammogram   Diagnostic Mammogram 01/20/20 IMPRESSION: 1.  Stable post lumpectomy changes of the left breast.   2. Suspicious microcalcifications over the right upper outer quadrant spanning 3.6 cm.   02/02/2020 Initial Biopsy   Diagnosis 02/02/20 Breast, right, needle core biopsy, upper outer quadrant, x clip - LOBULAR CARCINOMA IN SITU WITH PLEOMORPHIC FEATURES AND CALCIFICATIONS, INVOLVING ADENOSIS. SEE NOTE Diagnosis Note Immunohistochemical stain for E-cadherin is negative in the lesional cells, consistent with a lobular phenotype. Immunostains for p63, SMM 1 and calponin do not show evidence of invasive carcinoma.    02/04/2020  Initial Diagnosis   Lobular carcinoma in situ (LCIS) of right breast   03/18/2020 Surgery   RIGHT BREAST LUMPECTOMY WITH RADIOACTIVE SEED LOCALIZATION by Dr BAlessandra Bevels   03/18/2020 Pathology Results   FINAL MICROSCOPIC DIAGNOSIS:   A. BREAST, RIGHT, LUMPECTOMY:  - Pleomorphic lobular carcinoma in situ with calcifications and  underlying complex sclerosing lesion, adenosis and fibroadenomatoid  change.  - Margins of resection are not involved (Closest margins: < 1 mm,  anterior, posterior, inferior and medial).  - Biopsy site.    COMMENT:   P63, Calponin and SMM-1 demonstrate the presence of myoepithelium in the  select focus.       RISK FACTORS:  Menarche was at age 61  First live birth at age N/A.  OCP use for approximately 5 years.  Ovaries intact: yes.  Hysterectomy: no.  Menopausal status: postmenopausal.  HRT use: 0 years. Colonoscopy: yes; normal. Mammogram within the last year: yes. Number of breast biopsies: 2. Up to date with pelvic exams: yes. Any excessive radiation exposure in the past: no  Past Medical History:  Diagnosis Date   Anxiety    Cancer ([facility] 2022   right breast LCIS   Cancer ([facility] 2019   left breast   Depression    Dysrhythmia    [facility]p [facility]  Family history of breast cancer    Family history of melanoma    History of radiation therapy 04/22/18-06/03/18   Left Breast, left SCV, axilla 50 Gy in 25 fractions,  Left breast boost 10 Gy in 5 fractions.    Hypothyroidism    Personal history of radiation therapy    PONV (postoperative nausea and vomiting)    Thyroid disease     Past Surgical History:  Procedure Laterality Date   APPENDECTOMY     BREAST BIOPSY Right 2014   fibroadenoma   BREAST BIOPSY Left 01/16/2018   BREAST BIOPSY Right 02/02/2020   LCIS   BREAST BIOPSY Right 08/04/2020   x2 LCIS   BREAST BIOPSY Right 08/13/2020   x2   BREAST EXCISIONAL BIOPSY Left 1990   benign   BREAST  EXCISIONAL BIOPSY Right 02/02/2020   LCIS   BREAST LUMPECTOMY Left 01/22/2018   BREAST LUMPECTOMY WITH AXILLARY LYMPH NODE BIOPSY Left 02/21/2018   Procedure: LEFT BREAST LUMPECTOMY WITH AXILLARY LYMPH NODE BIOPSY;  Surgeon: [name], MD;  Location: [facility];  Service: General;  Laterality: Left;   BREAST LUMPECTOMY WITH RADIOACTIVE SEED LOCALIZATION Right 03/18/2020   Procedure: RIGHT BREAST LUMPECTOMY WITH RADIOACTIVE SEED LOCALIZATION;  Surgeon: [name], MD;  Location: [facility];  Service: General;  Laterality: Right;   BREAST SURGERY     ENDOMETRIAL ABLATION     EYE SURGERY     GLAUCOMA SURGERY Bilateral    RADIOACTIVE SEED GUIDED EXCISIONAL BREAST BIOPSY Right 04/28/2021   Procedure: RADIOACTIVE SEED GUIDED EXCISIONAL RIGHT BREAST BIOPSY X2;  Surgeon: [name], MD;  Location: [facility];  Service: General;  Laterality: Right;   RE-EXCISION OF BREAST LUMPECTOMY Left 03/20/2018   Procedure: RE-EXCISION OF BREAST LUMPECTOMY;  Surgeon: [name], MD;  Location: [facility];  Service: General;  Laterality: Left;    Social History   Socioeconomic History   Marital status: Married    Spouse name: Not on file   Number of children: Not on file   Years of education: Not on file   Highest education level: Not on file  Occupational History   Not on file  Tobacco Use   Smoking status: Every Day    Packs/day: 1.00    Years: 40.00    Total pack years: 40.00    Types: Cigarettes   Smokeless tobacco: Never  Vaping Use   Vaping Use: Former  Substance and Sexual Activity   Alcohol use: Yes    Alcohol/week: 40.0 standard drinks of alcohol    Types: 40 Cans of beer per week    Comment: drinks 6pk beer daily   Drug use: No   Sexual activity: Yes    Birth control/protection: Post-menopausal    Comment: endometrial ablasion  Other Topics Concern   Not on file  Social History Narrative   Not on file   Social Determinants of Health   Financial  Resource Strain: Not on file  Food Insecurity: Not on file  Transportation Needs: No Transportation Needs (07/12/2018)   PRAPARE - Hydrologist (Medical): No    Lack of Transportation (Non-Medical): No  Physical Activity: Not on file  Stress: Not on file  Social Connections: Not on file     FAMILY HISTORY:  We obtained a detailed, 4-generation family history.  Significant diagnoses are listed below: Family History  Problem Relation Age of Onset   Heart disease Mother    Pneumonia Father    Hyperlipidemia Sister    Melanoma Sister 37       hx of 5 melanomas   Lung cancer Maternal Uncle        mesothelioma  Pneumonia Maternal Uncle    Dementia Paternal Grandmother    Lung disease Paternal Grandfather    Breast cancer Other        MGMs sister dx < 57   Breast cancer Other        PGFs sister dx < 56   Breast cancer Cousin        mother's mat first cousin, dx < 42     The patient does not have children.  She has two brothers and a sister.  Her sister has had melanoma 5 times.  Her father is deceased and her mother is living.  The patient's mother is currently living  She had two brothers, one had mesothelioma.  The maternal grandparents are deceased.  The grandmother's sister and niece had breast cancer under 57.  The patient's father died of pneumonia.  He had a brother and sister who were cancer free. The paternal grandparents did not have cancer but the grandfather's sister had breast cancer under 41.   Ms. Emory is unaware of previous family history of genetic testing for hereditary cancer risks. Patient's maternal ancestors are of [place], [place] and [place] descent, and paternal ancestors are of [place] descent. There is no reported Ashkenazi Jewish ancestry. There is no known consanguinity.  GENETIC COUNSELING ASSESSMENT: Ms. Haseman is a 61 y.o. female with a personal and family history of breast cancer which is somewhat suggestive of a  hereditary cancer syndrome and predisposition to cancer given the number of cases of breast cancer in the family and the multiple diagnosis in the patient. We, therefore, discussed and recommended the following at today's visit.   DISCUSSION: We discussed that, in general, most cancer is not inherited in families, but instead is sporadic or familial. Sporadic cancers occur by chance and typically happen at older ages (>50 years) as this type of cancer is caused by genetic changes acquired during an individual's lifetime. Some families have more cancers than would be expected by chance; however, the ages or types of cancer are not consistent with a known genetic mutation or known genetic mutations have been ruled out. This type of familial cancer is thought to be due to a combination of multiple genetic, environmental, hormonal, and lifestyle factors. While this combination of factors likely increases the risk of cancer, the exact source of this risk is not currently identifiable or testable.  We discussed that 5 - 10% of breast cancer is hereditary, with most cases associated with BRCA mutations.  There are other genes that can be associated with hereditary breast cancer syndromes.  These include ATM, CHEK2 and PALB2.  We discussed that testing is beneficial for several reasons including knowing how to follow individuals after completing their treatment, identifying whether potential treatment options such as PARP inhibitors would be beneficial, and understand if other family members could be at risk for cancer and allow them to undergo genetic testing.   We reviewed the characteristics, features and inheritance patterns of hereditary cancer syndromes. We also discussed genetic testing, including the appropriate family members to test, the process of testing, insurance coverage and turn-around-time for results. We discussed the implications of a negative, positive, carrier and/or variant of uncertain  significant result. Ms. Rodenbaugh  was offered a common hereditary cancer panel (47 genes) and an expanded pan-cancer panel (77 genes). Ms. Howser was informed of the benefits and limitations of each panel, including that expanded pan-cancer panels contain genes that do not have clear management guidelines at this point in time.  We also discussed that as the number of genes included on a panel increases, the chances of variants of uncertain significance increases. Ms. Alfred decided to pursue genetic testing for the CancerNext-Expanded+RNAinsight gene panel.   The CancerNext-Expanded gene panel offered by [facility] and includes sequencing and rearrangement analysis for the following 77 genes: AIP, ALK, APC*, ATM*, AXIN2, BAP1, BARD1, BLM, BMPR1A, BRCA1*, BRCA2*, BRIP1*, CDC73, CDH1*, CDK4, CDKN1B, CDKN2A, CHEK2*, CTNNA1, DICER1, FANCC, FH, FLCN, GALNT12, KIF1B, LZTR1, MAX, MEN1, MET, MLH1*, MSH2*, MSH3, MSH6*, MUTYH*, NBN, NF1*, NF2, NTHL1, PALB2*, PHOX2B, PMS2*, POT1, PRKAR1A, PTCH1, PTEN*, RAD51C*, RAD51D*, RB1, RECQL, RET, SDHA, SDHAF2, SDHB, SDHC, SDHD, SMAD4, SMARCA4, SMARCB1, SMARCE1, STK11, SUFU, TMEM127, TP53*, TSC1, TSC2, VHL and XRCC2 (sequencing and deletion/duplication); EGFR, EGLN1, HOXB13, KIT, MITF, PDGFRA, POLD1, and POLE (sequencing only); EPCAM and GREM1 (deletion/duplication only). DNA and RNA analyses performed for * genes.   Based on Ms. Ngu's personal and family history of cancer, she meets medical criteria for genetic testing. Despite that she meets criteria, she may still have an out of pocket cost. We discussed that if her out of pocket cost for testing is over $100, the laboratory will call and confirm whether she wants to proceed with testing.  If the out of pocket cost of testing is less than $100 she will be billed by the genetic testing laboratory.   PLAN: After considering the risks, benefits, and limitations, Ms. Romanello provided informed consent to pursue genetic testing  and the blood sample was sent to [facility] for analysis of the CancerNext-Expanded+RNAinsight. Results should be available within approximately 2-3 weeks' time, at which point they will be disclosed by telephone to Ms. Wilborn, as will any additional recommendations warranted by these results. Ms. Fite will receive a summary of her genetic counseling visit and a copy of her results once available. This information will also be available in Epic.   Lastly, we encouraged Ms. Mcdiarmid to remain in contact with cancer genetics annually so that we can continuously update the family history and inform her of any changes in cancer genetics and testing that may be of benefit for this family.   Ms. Salahuddin questions were answered to her satisfaction today. Our contact information was provided should additional questions or concerns arise. Thank you for the referral and allowing [place] to share in the care of your patient.   Seeley Southgate P. Florene Glen, [place], [facility] Licensed, Insurance risk surveyor [email or web address] phone: [phone]  The patient was seen for a total of 40 minutes in face-to-face genetic counseling.  The patient was seen alone.  Drs. Michell Heinrich, and/or Blaine were available for questions, if needed..    _______________________________________________________________________ For Office Staff:  Number of people involved in session: 1 Was an Intern/ student involved with case: no

## 2022-08-03 ENCOUNTER — Encounter (HOSPITAL_COMMUNITY)
Admission: RE | Admit: 2022-08-03 | Discharge: 2022-08-03 | Disposition: A | Discharge status: Home / Self Care | Payer: BC Managed Care – PPO | Source: Ambulatory Visit | Attending: Radiation Oncology | Admitting: Radiation Oncology

## 2022-08-03 ENCOUNTER — Other Ambulatory Visit (HOSPITAL_COMMUNITY): Payer: Self-pay | Admitting: Neurosurgery

## 2022-08-03 DIAGNOSIS — Z853 Personal history of malignant neoplasm of breast: Secondary | ICD-10-CM | POA: Diagnosis not present

## 2022-08-03 DIAGNOSIS — S12191A Other nondisplaced fracture of second cervical vertebra, initial encounter for closed fracture: Secondary | ICD-10-CM

## 2022-08-03 DIAGNOSIS — R911 Solitary pulmonary nodule: Secondary | ICD-10-CM | POA: Diagnosis not present

## 2022-08-03 DIAGNOSIS — C7951 Secondary malignant neoplasm of bone: Secondary | ICD-10-CM | POA: Diagnosis not present

## 2022-08-03 LAB — GLUCOSE, CAPILLARY: Glucose-Capillary: 120 mg/dL — ABNORMAL HIGH (ref 70–99)

## 2022-08-03 MED ORDER — FLUDEOXYGLUCOSE F - 18 (FDG) INJECTION
7.5000 | Freq: Once | INTRAVENOUS | Status: AC
  Administered 2022-08-03: 5.18 via INTRAVENOUS

## 2022-08-03 NOTE — Progress Notes (Deleted)
**Martha [name] De-Identified via Obfuscation** Martha Martha [name]   [phone] Fax:([phone]) Martha Martha [name]   Patient Care Team: Martha Barrack, MD as PCP - General (Family Medicine) Martha Klein, MD as Consulting Physician (General Surgery) Martha Merle, MD as Consulting Physician (Hematology) Martha Gibson, MD as Attending Physician (Radiation Oncology) Martha Feeling, [name] as Nurse Practitioner (Nurse Practitioner)  Date of Service:  08/03/2022   CHIEF COMPLAINTS/PURPOSE OF CONSULTATION:  *** Breast Cancer, ER***  REFERRING PHYSICIAN:  {Usually [facility] or [facility]}   ASSESSMENT & PLAN:  Martha Martha [name] is a 61 y.o. {post-menopausal/hysterectomy} female with a history of ***  1. {Diagnosis from problem list}, Stage ***, c(***), ER/PR/HER2***, Grade *** OR 1. *** breast DCIS, grade ***, ER/PR*** *** {Use abyfdcis or kdyfoncotype here} {Include yftamoxifen or yfai for side effects}  2. Bone Health {or osteopenia/osteoporosis} -Her most recent DEXA was *** showing *** {or She has never had a DEXA. We will obtain one for baseline}   PLAN:  ***   Oncology History Overview Martha [name]  Cancer Staging Malignant neoplasm of lower-inner quadrant of left breast in female, estrogen receptor positive (Martha Martha [name]) Staging form: Breast, AJCC 8th Edition - Clinical stage from 01/16/2018: Stage IA (cT1c, cN0, cM0, G2, ER+, PR+, HER2-) - Signed by Martha Merle, MD on 01/23/2018 - Pathologic: Stage IA (pT1c, pN1, cM0, G1, ER+, PR+, HER2-) - Signed by Martha Gibson, MD on 04/09/2018     Malignant neoplasm of lower-inner quadrant of left breast in female, estrogen receptor positive (Martha Martha [name])  01/15/2018 Mammogram   Diagnositc Mammogram 01/15/18  IMPRESSION: 1. Suspicious 1.2 x 1.4 x 1.3 cm mixed echogenicity mass left breast 7 o'clock position retroareolar location at the site of palpable concern.. 2. Indeterminate Within the left breast 7:30 o'clock retroareolar location, adjacent to the palpable mass,  is a 0.5 x 0.4 x 0.5 cm oval circumscribed hypoechoic mass. 3. Indeterminate calcifications within the lateral left breast. Location of these calcifications is not definitely confirmed on the true lateral view.    01/16/2018 Initial Biopsy   Diagnosis 01/16/18 1. Breast, left, needle core biopsy, 7:30 o'clock (ribbon clip) - FIBROCYSTIC CHANGES WITH SCLEROSING ADENOSIS AND CALCIFICATIONS. - FIBROADENOMATOID CHANGE. - NO MALIGNANCY IDENTIFIED. 2. Breast, left, needle core biopsy, 7 o'clock position (coil clip) - INVASIVE MAMMARY CARCINOMA, MSBR GRADE I/II. - SEE MICROSCOPIC DESCRIPTION Microscopic Comment  ADDENDUM: Immunohistochemistry for E-Cadherin is strongly positive in the tumor consistent with ductal carcinoma. (JDP:ah 01/17/18)   01/16/2018 Receptors her2   Estrogen Receptor: 100%, POSITIVE, STRONG STAINING INTENSITY Progesterone Receptor: 50%, POSITIVE, STRONG STAINING INTENSITY Proliferation Marker Ki67: 20% HER2 Negative   01/16/2018 Cancer Staging   Staging form: Breast, AJCC 8th Edition - Clinical stage from 01/16/2018: Stage IA (cT1c, cN0, cM0, G2, ER+, PR+, HER2-) - Signed by Martha Merle, MD on 01/23/2018   01/22/2018 Initial Diagnosis   Malignant neoplasm of lower-inner quadrant of left breast in female, estrogen receptor positive (Martha Martha [name])   02/21/2018 Surgery    LEFT BREAST LUMPECTOMY WITH AXILLARY LYMPH NODE BIOPSY by Dr. Barry [name]  02/21/18   02/21/2018 Pathology Results   Diagnosis 02/21/18 1. Breast, lumpectomy, Left - INVASIVE DUCTAL CARCINOMA, GRADE I, 1.6 CM. - DUCTAL CARCINOMA IN SITU, INTERMEDIATE NUCLEAR GRADE. - ANTERIOR AND MEDIAL RESECTION MARGINS ARE POSITIVE FOR CARCINOMA. - NEGATIVE FOR LYMPHOVASCULAR OR PERINEURAL INVASION. - BACKGROUND BREAST TISSUE WITH FIBROCYSTIC CHANGE, INCLUDING SCLEROSING ADENOSIS. - BIOPSY SITE CHANGES. - SEE ONCOLOGY TABLE. 2. Lymph node, sentinel, biopsy, Left Axillary #1 - METASTATIC BREAST  CARCINOMA TO A LYMPH NODE, 1.0 CM IN  GREATEST DIMENSION, WITH EXTRANODAL EXTENSION (1/1). 3. Lymph node, sentinel, biopsy, Left Axillary #2 - LYMPH NODE, NEGATIVE FOR CARCINOMA (0/1).    02/21/2018 Miscellaneous   Mammaprint 02/21/18 Low Risk with 10-year risk of recurrnce at 10% -No potential signifcant chemotherapy benefit   03/20/2018 Pathology Results   RE-EXCISION OF BREAST LUMPECTOMY by Dr. Barry [name]  Diagnosis 03/20/18 1. Breast, excision, Left new anterior margin - FIBROCYSTIC CHANGES WITH ADENOSIS AND CALCIFICATIONS. - HEALING BIOPSY SITE. - THERE IS NO EVIDENCE OF MALIGNANCY. 2. Breast, excision, Left new medial margin - FIBROCYSTIC CHANGES WITH ADENOSIS AND CALCIFICATIONS. - HEALING BIOPSY SITE. - THERE IS NO EVIDENCE OF MALIGNANCY. Microscopic Comment 1. -2. The surgical resection margin(s) of the specimen were inked and microscopically evaluated. (JBK:kh 03-22-18)   04/09/2018 Cancer Staging   Staging form: Breast, AJCC 8th Edition - Pathologic: Stage IA (pT1c, pN1, cM0, G1, ER+, PR+, HER2-) - Signed by Martha Gibson, MD on 04/09/2018   04/22/2018 - 06/03/2018 Radiation Therapy   Martha Martha [name] with Dr. Isidore [name] 04/22/18-06/03/18   05/2018 -  Anti-estrogen oral therapy   Letrozole 2.47m started 05/2018    Survivorship   Per Martha Martha [name]    Lobular carcinoma in situ (LCIS) of right breast  01/20/2020 Mammogram   Diagnostic Mammogram 01/20/20 IMPRESSION: 1.  Stable post lumpectomy changes of the left breast.   2. Suspicious microcalcifications over the right upper outer quadrant spanning 3.6 cm.   02/02/2020 Initial Biopsy   Diagnosis 02/02/20 Breast, right, needle core biopsy, upper outer quadrant, x clip - LOBULAR CARCINOMA IN SITU WITH PLEOMORPHIC FEATURES AND CALCIFICATIONS, INVOLVING ADENOSIS. SEE Martha [name] Diagnosis Martha [name] Immunohistochemical stain for E-cadherin is negative in the lesional cells, consistent with a lobular phenotype. Immunostains for p63, SMM 1 and calponin do not show evidence of invasive  carcinoma.    02/04/2020 Initial Diagnosis   Lobular carcinoma in situ (LCIS) of right breast   03/18/2020 Surgery   RIGHT BREAST LUMPECTOMY WITH RADIOACTIVE SEED LOCALIZATION by Dr BAlessandra Bevels   03/18/2020 Pathology Results   FINAL MICROSCOPIC DIAGNOSIS:   A. BREAST, RIGHT, LUMPECTOMY:  - Pleomorphic lobular carcinoma in situ with calcifications and  underlying complex sclerosing lesion, adenosis and fibroadenomatoid  change.  - Margins of resection are not involved (Closest margins: < 1 mm,  anterior, posterior, inferior and medial).  - Biopsy site.    COMMENT:   P63, Calponin and SMM-1 demonstrate the presence of myoepithelium in the  select focus.       HISTORY OF PRESENTING ILLNESS: *** Martha Pryce89y.o. female is a here because of breast cancer. The patient was referred by ***. The patient presents to the clinic today accompanied by ***.   {She had routine screening mammography on *** showing a possible abnormality in the *** breast.} She underwent {bilateral} diagnostic mammography and *** breast ultrasonography on *** showing: ***.  Biopsy on *** showed: ***. Prognostic indicators significant for: estrogen receptor, ***% {positive or negative} and progesterone receptor, ***% {positive or negative}. Proliferation marker Ki67 at ***%. HER2 {positive or negative}.    Today the patient notes they felt/Martha [name] prior/after...   She has a PMHx of....   Socially...    GYN HISTORY *** Menarchal: xx LMP: xx Contraceptive: HRT:  GP:    REVIEW OF SYSTEMS:   *** Constitutional: Denies fevers, chills or abnormal night sweats Eyes: Denies blurriness of vision, double vision or watery eyes Ears, nose, mouth, throat, and face: Denies mucositis  or sore throat Respiratory: Denies cough, dyspnea or wheezes Cardiovascular: Denies palpitation, chest discomfort or lower extremity swelling Gastrointestinal:  Denies nausea, heartburn or change in bowel habits Skin:  Denies abnormal skin rashes Lymphatics: Denies new lymphadenopathy or easy bruising Neurological:Denies numbness, tingling or new weaknesses Behavioral/Psych: Mood is stable, no new changes  All other systems were reviewed with the patient and are negative.   MEDICAL HISTORY:  Past Medical History:  Diagnosis Date   Anxiety    Cancer ([place]) 2022   right breast LCIS   Cancer ([place]) 2019   left breast   Depression    Dysrhythmia    SVT, s/p ablation ~ 2012 in at [facility]   Family history of breast cancer    Family history of melanoma    History of radiation therapy 04/22/18-06/03/18   Left Breast, left SCV, axilla 50 Gy in 25 fractions, Left breast boost 10 Gy in 5 fractions.    Hypothyroidism    Personal history of radiation therapy    PONV (postoperative nausea and vomiting)    Thyroid disease     SURGICAL HISTORY: Past Surgical History:  Procedure Laterality Date   APPENDECTOMY     BREAST BIOPSY Right 2014   fibroadenoma   BREAST BIOPSY Left 01/16/2018   BREAST BIOPSY Right 02/02/2020   LCIS   BREAST BIOPSY Right 08/04/2020   x2 LCIS   BREAST BIOPSY Right 08/13/2020   x2   BREAST EXCISIONAL BIOPSY Left 1990   benign   BREAST EXCISIONAL BIOPSY Right 02/02/2020   LCIS   BREAST LUMPECTOMY Left 01/22/2018   BREAST LUMPECTOMY WITH AXILLARY LYMPH NODE BIOPSY Left 02/21/2018   Procedure: LEFT BREAST LUMPECTOMY WITH AXILLARY LYMPH NODE BIOPSY;  Surgeon: Martha Klein, MD;  Location: [place];  Service: General;  Laterality: Left;   BREAST LUMPECTOMY WITH RADIOACTIVE SEED LOCALIZATION Right 03/18/2020   Procedure: RIGHT BREAST LUMPECTOMY WITH RADIOACTIVE SEED LOCALIZATION;  Surgeon: Martha Klein, MD;  Location: [place];  Service: General;  Laterality: Right;   BREAST SURGERY     ENDOMETRIAL ABLATION     EYE SURGERY     GLAUCOMA SURGERY Bilateral    RADIOACTIVE SEED GUIDED EXCISIONAL BREAST BIOPSY Right 04/28/2021   Procedure: RADIOACTIVE SEED GUIDED EXCISIONAL  RIGHT BREAST BIOPSY X2;  Surgeon: Martha Klein, MD;  Location: [place];  Service: General;  Laterality: Right;   RE-EXCISION OF BREAST LUMPECTOMY Left 03/20/2018   Procedure: RE-EXCISION OF BREAST LUMPECTOMY;  Surgeon: Martha Klein, MD;  Location: [place];  Service: General;  Laterality: Left;    SOCIAL HISTORY: Social History   Socioeconomic History   Marital status: Married    Spouse name: Not on file   Number of children: Not on file   Years of education: Not on file   Highest education level: Not on file  Occupational History   Not on file  Tobacco Use   Smoking status: Every Day    Packs/day: 1.00    Years: 40.00    Total pack years: 40.00    Types: Cigarettes   Smokeless tobacco: Never  Vaping Use   Vaping Use: Former  Substance and Sexual Activity   Alcohol use: Yes    Alcohol/week: 40.0 standard drinks of alcohol    Types: 40 Cans of beer per week    Comment: drinks 6pk beer daily   Drug use: No   Sexual activity: Yes    Birth control/protection: Post-menopausal    Comment: endometrial ablasion  Other Topics Concern   Not on file  Social History Narrative   Not on file   Social Determinants of Health   Financial Resource Strain: Not on file  Food Insecurity: Not on file  Transportation Needs: No Transportation Needs (07/12/2018)   PRAPARE - Hydrologist (Medical): No    Lack of Transportation (Non-Medical): No  Physical Activity: Not on file  Stress: Not on file  Social Connections: Not on file  Intimate Partner Violence: Not At Risk (04/09/2018)   Humiliation, Afraid, Rape, and Kick questionnaire    Fear of Current or Ex-Partner: No    Emotionally Abused: No    Physically Abused: No    Sexually Abused: No    FAMILY HISTORY: Family History  Problem Relation Age of Onset   Heart disease Mother    Pneumonia Father    Hyperlipidemia Sister    Melanoma Sister 43       hx of 5  melanomas   Lung cancer Maternal Uncle        mesothelioma   Pneumonia Maternal Uncle    Dementia Paternal Grandmother    Lung disease Paternal Grandfather    Breast cancer Other        MGMs sister dx < 56   Breast cancer Other        PGFs sister dx < 20   Breast cancer Cousin        mother's mat first cousin, dx < 40    ALLERGIES:  is allergic to morphine and related.  MEDICATIONS:  Current Outpatient Medications  Medication Sig Dispense Refill   citalopram (CELEXA) 20 MG tablet TAKE 1 TABLET BY MOUTH EVERY DAY 90 tablet 0   cyclobenzaprine (FLEXERIL) 10 MG tablet Take 1 tablet (10 mg total) by mouth 3 (three) times daily as needed for muscle spasms. (Patient not taking: Reported on 08/01/2022) 30 tablet 0   diclofenac Sodium (VOLTAREN) 1 % GEL Apply 4 g topically 4 (four) times daily. (Patient not taking: Reported on 08/01/2022) 100 g 0   letrozole (FEMARA) 2.5 MG tablet TAKE 1 TABLET BY MOUTH EVERY DAY 90 tablet 3   levothyroxine (SYNTHROID) 75 MCG tablet TAKE 1 TABLET BY MOUTH EVERY DAY BEFORE BREAKFAST 90 tablet 0   meloxicam (MOBIC) 15 MG tablet Take 1 tablet (15 mg total) by mouth daily. 30 tablet 0   methylPREDNISolone (MEDROL DOSEPAK) 4 MG TBPK tablet Day 1: 34m before breakfast, 4 mg after lunch, 4 mg after supper, and 8 mg at bedtime Day 2: 4 mg before breakfast, 4 mg after lunch, 4 mg  after supper, and 8 mg  at bedtime Day 3:  4 mg  before breakfast, 4 mg  after lunch, 4 mg after supper, and 4 mg  at bedtime Day 4: 4 mg  before breakfast, 4 mg  after lunch, and 4 mg at bedtime Day 5: 4 mg  before breakfast and 4 mg at bedtime Day 6: 4 mg  before breakfast (Patient not taking: Reported on 08/01/2022) 1 each 0   oseltamivir (TAMIFLU) 75 MG capsule Take 1 capsule (75 mg total) by mouth 2 (two) times daily. (Patient not taking: Reported on 08/01/2022) 20 capsule 0   OVER THE COUNTER MEDICATION Take 1 tablet by mouth daily. Protandim Supplement     Probiotic Product (PROBIOTIC PO)  Take 1 capsule by mouth daily.     No current facility-administered medications for this visit.    PHYSICAL EXAMINATION: ECOG PERFORMANCE STATUS: {  CHL ONC ECOG PS:[phone]}  There were no vitals filed for this visit. There were no vitals filed for this visit. *** GENERAL:alert, no distress and comfortable SKIN: skin color, texture, turgor are normal, no rashes or significant lesions EYES: normal, Conjunctiva are pink and non-injected, sclera clear {OROPHARYNX:no exudate, no erythema and lips, buccal mucosa, and tongue normal}  NECK: supple, thyroid normal size, non-tender, without nodularity LYMPH:  no palpable lymphadenopathy in the cervical, axillary {or inguinal} LUNGS: clear to auscultation and percussion with normal breathing effort HEART: regular rate & rhythm and no murmurs and no lower extremity edema ABDOMEN:abdomen soft, non-tender and normal bowel sounds Musculoskeletal:no cyanosis of digits and no clubbing  NEURO: alert & oriented x 3 with fluent speech, no focal motor/sensory deficits BREAST: *** No palpable mass, nodules or adenopathy bilaterally. Breast exam benign.  LABORATORY DATA:  I have reviewed the data as listed    Latest Ref Rng & Units 08/02/2022   10:42 AM 04/12/2021    1:48 PM 05/21/2020    1:50 PM  CBC  WBC 4.0 - 10.5 K/uL 6.6  6.4  6.8   Hemoglobin 12.0 - 15.0 g/dL 14.5  14.6  14.6   Hematocrit 36.0 - 46.0 % 41.7  42.5  41.8   Platelets 150 - 400 K/uL 223  210.0  193        Latest Ref Rng & Units 08/02/2022   10:42 AM 04/12/2021    1:48 PM 05/21/2020    1:50 PM  CMP  Glucose 70 - 99 mg/dL 109  91  98   BUN 8 - 23 mg/dL _0 Creatinine 0.44 - 1.00 mg/dL 0.52  0.61  0.74   Sodium 135 - 145 mmol/L 137  138  138   Potassium 3.5 - 5.1 mmol/L 3.9  4.0  4.0   Chloride 98 - 111 mmol/L 101  100  102   CO2 22 - 32 mmol/L _1 Calcium 8.9 - 10.3 mg/dL 10.4  9.8  9.6   Total Protein 6.5 - 8.1 g/dL 7.4  7.3  7.2   Total Bilirubin 0.3 -  1.2 mg/dL 0.5  0.5  0.6   Alkaline Phos 38 - 126 U/L 241  84  96   AST 15 - 41 U/L _2 ALT 0 - 44 U/L _3 RADIOGRAPHIC STUDIES: I have personally reviewed the radiological images as listed and agreed with the findings in the report. MR CERVICAL SPINE WO CONTRAST  Result Date: 07/25/2022 CLINICAL DATA:  Neck pain.  Degenerative disc disease. EXAM: MRI CERVICAL SPINE WITHOUT CONTRAST TECHNIQUE: Multiplanar, multisequence MR imaging of the cervical spine was performed. No intravenous contrast was administered. COMPARISON:  None Available. FINDINGS: Alignment: Straightening of cervical lordosis. Vertebrae: Marrow replacing lesions seen throughout the covered cervical and upper thoracic spine. Very large deposit at C2 with pathologic fracture through the base of dens. Extensive involvement of the C2 right lateral mass which is likely also fractured with extra osseous tumor. Extraosseous tumor ventrally through the right lateral mass of C1. Cord: Normal signal.  Cord compression described below. Posterior Fossa, vertebral arteries, paraspinal tissues: Extraosseous tumor around the C1 and C2. Prevertebral edema in the setting of fracture. Disc levels: C2-3: Suspect tumor infiltrating the right foramen foramen. Tumor could also extends superior to the right lateral mass potentially affecting the right C2 nerve root. C3-4:  Unremarkable. C4-5: Disc bulging with downward pointing extrusion indenting the ventral cord. C5-6: Disc collapse with endplate and uncovertebral ridging causing biforaminal impingement and cord flattening. C6-7: Disc collapse with disc bulging and uncovertebral ridging causing biforaminal impingement and cord flattening. C7-T1:Mild facet spurring These results were called by telephone at the time of interpretation on 07/25/2022 at 9:49 am to provider [facility] , who verbally acknowledged these results. IMPRESSION: 1. Extensive osseous metastatic disease with  pathologic fracture at the base of dens and C2 right lateral mass. Extraosseous tumor at C1 and C2 likely impinging on the right C2 and C3 nerve roots. 2. Degenerative cord impingement at C4-5 to C6-7. Biforaminal impingement at C5-6 and C6-7. Electronically Signed   By: Jorje Guild M.D.   On: 07/25/2022 09:49     No orders of the defined types were placed in this encounter.   All questions were answered. The patient knows to call the clinic with any problems, questions or concerns. The total time spent in the appointment was {CHL ONC TIME VISIT - [id number]}.     Baldemar Friday, CMA 08/03/2022 4:29 PM  I, Audry Riles, am acting as scribe for Martha Merle, MD.   {Add scribe attestation statement}

## 2022-08-04 ENCOUNTER — Other Ambulatory Visit: Payer: Self-pay

## 2022-08-04 ENCOUNTER — Inpatient Hospital Stay (HOSPITAL_BASED_OUTPATIENT_CLINIC_OR_DEPARTMENT_OTHER): Payer: BC Managed Care – PPO | Admitting: Hematology

## 2022-08-04 ENCOUNTER — Encounter: Payer: Self-pay | Admitting: Hematology

## 2022-08-04 ENCOUNTER — Encounter: Payer: Self-pay | Admitting: *Deleted

## 2022-08-04 VITALS — BP 147/74 | HR 102 | Temp 98.6°F | Resp 19 | Ht 63.5 in | Wt 105.3 lb

## 2022-08-04 DIAGNOSIS — C50312 Malignant neoplasm of lower-inner quadrant of left female breast: Secondary | ICD-10-CM | POA: Diagnosis not present

## 2022-08-04 DIAGNOSIS — Z170 Estrogen receptor positive status [ER+]: Secondary | ICD-10-CM

## 2022-08-04 DIAGNOSIS — C7951 Secondary malignant neoplasm of bone: Secondary | ICD-10-CM | POA: Diagnosis not present

## 2022-08-04 DIAGNOSIS — Z79811 Long term (current) use of aromatase inhibitors: Secondary | ICD-10-CM | POA: Diagnosis not present

## 2022-08-04 MED ORDER — TRAMADOL HCL 50 MG PO TABS: 25.0000 mg | ORAL_TABLET | Freq: Four times a day (QID) | ORAL | 0 refills | Status: DC | PRN

## 2022-08-04 MED ORDER — NITROFURANTOIN MONOHYD MACRO 100 MG PO CAPS: 100.0000 mg | ORAL_CAPSULE | Freq: Two times a day (BID) | ORAL | 0 refills | Status: DC

## 2022-08-04 NOTE — Research (Signed)
Trial:  [id number]: A RANDOMIZED TRIAL ADDRESSING CANCER-RELATED FINANCIAL HARDSHIP THROUGH DELIVERY OF A PROACTIVE FINANCIAL NAVIGATION INTERVENTION (CREDIT)  Patient Martha [name] was identified by Dr. Burr Medico as a potential candidate for the above listed study.  This Clinical Research Nurse met with [name], [id number], on 08/04/22 in a manner and location that ensures patient privacy to discuss participation in the above listed research study.  Patient is Accompanied by her husband .  A copy of the informed consent document and separate HIPAA Authorization was provided to the patient.  Patient reads, speaks, and understands [place].   Patient was provided with the business card of this Nurse and encouraged to contact the research team with any questions.  Approximately 10 minutes were spent with the patient reviewing the informed consent documents.  Patient was provided the option of taking informed consent documents home to review and was encouraged to review at their convenience with their support network, including other care providers. Patient took the consent documents home to review. Foye Spurling, BSN, RN, [place] Nurse II [phone] 08/04/2022 4:53 PM

## 2022-08-04 NOTE — Progress Notes (Signed)
[place]   [phone] [phone]   Clinic Follow up Note   Patient Care Team: [name], MD as PCP - General (Family Medicine) [name], MD as Consulting Physician (General Surgery) Truitt Merle, MD as Consulting Physician (Hematology) Eppie Gibson, MD as Attending Physician (Radiation Oncology) Alla Feeling, NP as Nurse Practitioner (Nurse Practitioner)  Date of Service:  08/04/2022  CHIEF COMPLAINT: f/u of left breast cancer and Right   CURRENT THERAPY:  Letrozole 2.39m started in 05/2018   ASSESSMENT:  MVetta Couzensis a 61y.o. female with   Cancer, metastatic to bone ([facility] -presented with neck pain in July 2023 -MRI cervical spine on 07/24/2022 showed Extensive osseous metastatic disease with pathologic fracture at the base of dens and C2 right lateral mass. Extraosseous tumor at C1 and C2 likely impinging on the right C2 and C3 nerve roots. -PET scan from 08/03/2022 showed diffuse bone mets  -I recommend bone biopsy to confirm metastasis and repeat ER/PR/HER2 -I recommended Zometa infusion every 3 months to stress to her bone.  Benefit and side effect discussed with her, she agrees  Malignant neoplasm of lower-inner quadrant of left breast in female, estrogen receptor positive ([place] --She was diagnosed in 12/2017. She is s/p left breast lumpectomy and adjuvant radiation.  -She started anti-estrogen therapy with letrozole on 05/2018. Tolerating well with no issues -lost f/u after visit in 04/2020 -she likely has bone mets from her previous breast cancer.  I personally reviewed her PET scan image and discussed the findings with her -I will reach out to her neurosurgeon Dr. TMarcello Mooresto see if any cervical spine surgery is planned, and if biopsy during surgery is feasible.  If not, I will refer to IR follow-up iliac bone biopsy. -If her biopsy confirms metastatic breast cancer, ER positive and HER2 negative, I plan to change  her treatment to fulvestrant injections and Verzenio.  I discussed the benefit and potential side effect, she is interested.  Social and financial stress -She is currently unemployed, does have insurance from her husband, she is quite distressed by the financial burden from her cancer treatment. -I referred her to our research team, she will be screened for the financial burden study. -SEducation officer, museumreferral, for counseling, and guidance on social benefit application.    Goal of care discussion  -I discussed the incurable nature of her cancer, and the overall poor prognosis, especially if he/she does not have good response to cancer treatment  -The patient understands the goal of care is palliative. -she is full code now  -Will refer to sEducation officer, museumfor advanced directives   PLAN: - Discuss PET Scan -lab Reviewed, [place] -Discuss a tissue biopsy, either by Dr. TMarcello Mooresduring her cervical spine surgery, or IR -Referral to SW -Discuss changing from Letrozole to an injection fulvestrant and Verzenio (after surgery if she will have surgery) -She will proceed with palliative radiation to cervical spine lesion in the near future -Call in Tramadol -f/u in 2-4 weeks, depends on her radiation and surgery plans        SUMMARY OF ONCOLOGIC HISTORY: Oncology History Overview Note  Cancer Staging Malignant neoplasm of lower-inner quadrant of left breast in female, estrogen receptor positive ([place] Staging form: Breast, AJCC 8th Edition - Clinical stage from 01/16/2018: Stage IA (cT1c, cN0, cM0, G2, ER+, PR+, HER2-) - Signed by FTruitt Merle MD on 01/23/2018 - Pathologic: Stage IA (pT1c, pN1, cM0, G1, ER+,  PR+, HER2-) - Signed by Eppie Gibson, MD on 04/09/2018     Malignant neoplasm of lower-inner quadrant of left breast in female, estrogen receptor positive ([place])  01/15/2018 Mammogram   Diagnositc Mammogram 01/15/18  IMPRESSION: 1. Suspicious 1.2 x 1.4 x 1.3 cm  mixed echogenicity mass left breast 7 o'clock position retroareolar location at the site of palpable concern.. 2. Indeterminate Within the left breast 7:30 o'clock retroareolar location, adjacent to the palpable mass, is a 0.5 x 0.4 x 0.5 cm oval circumscribed hypoechoic mass. 3. Indeterminate calcifications within the lateral left breast. Location of these calcifications is not definitely confirmed on the true lateral view.    01/16/2018 Initial Biopsy   Diagnosis 01/16/18 1. Breast, left, needle core biopsy, 7:30 o'clock (ribbon clip) - FIBROCYSTIC CHANGES WITH SCLEROSING ADENOSIS AND CALCIFICATIONS. - FIBROADENOMATOID CHANGE. - NO MALIGNANCY IDENTIFIED. 2. Breast, left, needle core biopsy, 7 o'clock position (coil clip) - INVASIVE MAMMARY CARCINOMA, MSBR GRADE I/II. - SEE MICROSCOPIC DESCRIPTION Microscopic Comment  ADDENDUM: Immunohistochemistry for E-Cadherin is strongly positive in the tumor consistent with ductal carcinoma. (JDP:ah 01/17/18)   01/16/2018 Receptors her2   Estrogen Receptor: 100%, POSITIVE, STRONG STAINING INTENSITY Progesterone Receptor: 50%, POSITIVE, STRONG STAINING INTENSITY Proliferation Marker Ki67: 20% HER2 Negative   01/16/2018 Cancer Staging   Staging form: Breast, AJCC 8th Edition - Clinical stage from 01/16/2018: Stage IA (cT1c, cN0, cM0, G2, ER+, PR+, HER2-) - Signed by Truitt Merle, MD on 01/23/2018   01/22/2018 Initial Diagnosis   Malignant neoplasm of lower-inner quadrant of left breast in female, estrogen receptor positive ([place])   02/21/2018 Surgery    LEFT BREAST LUMPECTOMY WITH AXILLARY LYMPH NODE BIOPSY by Dr. Barry Dienes  02/21/18   02/21/2018 Pathology Results   Diagnosis 02/21/18 1. Breast, lumpectomy, Left - INVASIVE DUCTAL CARCINOMA, GRADE I, 1.6 CM. - DUCTAL CARCINOMA IN SITU, INTERMEDIATE NUCLEAR GRADE. - ANTERIOR AND MEDIAL RESECTION MARGINS ARE POSITIVE FOR CARCINOMA. - NEGATIVE FOR LYMPHOVASCULAR OR PERINEURAL INVASION. - BACKGROUND  BREAST TISSUE WITH FIBROCYSTIC CHANGE, INCLUDING SCLEROSING ADENOSIS. - BIOPSY SITE CHANGES. - SEE ONCOLOGY TABLE. 2. Lymph node, sentinel, biopsy, Left Axillary #1 - METASTATIC BREAST CARCINOMA TO A LYMPH NODE, 1.0 CM IN GREATEST DIMENSION, WITH EXTRANODAL EXTENSION (1/1). 3. Lymph node, sentinel, biopsy, Left Axillary #2 - LYMPH NODE, NEGATIVE FOR CARCINOMA (0/1).    02/21/2018 Miscellaneous   Mammaprint 02/21/18 Low Risk with 10-year risk of recurrnce at 10% -No potential signifcant chemotherapy benefit   03/20/2018 Pathology Results   RE-EXCISION OF BREAST LUMPECTOMY by Dr. Barry Dienes  Diagnosis 03/20/18 1. Breast, excision, Left new anterior margin - FIBROCYSTIC CHANGES WITH ADENOSIS AND CALCIFICATIONS. - HEALING BIOPSY SITE. - THERE IS NO EVIDENCE OF MALIGNANCY. 2. Breast, excision, Left new medial margin - FIBROCYSTIC CHANGES WITH ADENOSIS AND CALCIFICATIONS. - HEALING BIOPSY SITE. - THERE IS NO EVIDENCE OF MALIGNANCY. Microscopic Comment 1. -2. The surgical resection margin(s) of the specimen were inked and microscopically evaluated. (JBK:kh 03-22-18)   04/09/2018 Cancer Staging   Staging form: Breast, AJCC 8th Edition - Pathologic: Stage IA (pT1c, pN1, cM0, G1, ER+, PR+, HER2-) - Signed by Eppie Gibson, MD on 04/09/2018   04/22/2018 - 06/03/2018 Radiation Therapy   Radaiton with Dr. Isidore Moos 04/22/18-06/03/18   05/2018 -  Anti-estrogen oral therapy   Letrozole 2.35m started 05/2018    Survivorship   Per LCira Rue NP    Lobular carcinoma in situ (LCIS) of right breast  01/20/2020 Mammogram   Diagnostic Mammogram 01/20/20 IMPRESSION: 1.  Stable post lumpectomy changes of  the left breast.   2. Suspicious microcalcifications over the right upper outer quadrant spanning 3.6 cm.   02/02/2020 Initial Biopsy   Diagnosis 02/02/20 Breast, right, needle core biopsy, upper outer quadrant, x clip - LOBULAR CARCINOMA IN SITU WITH PLEOMORPHIC FEATURES AND CALCIFICATIONS, INVOLVING  ADENOSIS. SEE NOTE Diagnosis Note Immunohistochemical stain for E-cadherin is negative in the lesional cells, consistent with a lobular phenotype. Immunostains for p63, SMM 1 and calponin do not show evidence of invasive carcinoma.    02/04/2020 Initial Diagnosis   Lobular carcinoma in situ (LCIS) of right breast   03/18/2020 Surgery   RIGHT BREAST LUMPECTOMY WITH RADIOACTIVE SEED LOCALIZATION by Dr Alessandra Bevels    03/18/2020 Pathology Results   FINAL MICROSCOPIC DIAGNOSIS:   A. BREAST, RIGHT, LUMPECTOMY:  - Pleomorphic lobular carcinoma in situ with calcifications and  underlying complex sclerosing lesion, adenosis and fibroadenomatoid  change.  - Margins of resection are not involved (Closest margins: < 1 mm,  anterior, posterior, inferior and medial).  - Biopsy site.    COMMENT:   P63, Calponin and SMM-1 demonstrate the presence of myoepithelium in the  select focus.       INTERVAL HISTORY:  Sira Adsit is here for a follow up of left breast cancer and Right breast LCIS   She was last seen by me on  05/21/2020 She presents to the clinic accompanied by husband. Pt reports of having headaches/Cervical  fracture. She was able to see a Chief of Staff.She was advice  to continue to wear the neck brace. She states no OTC medication helps with pain.       All other systems were reviewed with the patient and are negative.  MEDICAL HISTORY:  Past Medical History:  Diagnosis Date   Anxiety    Cancer ([place]) 2022   right breast LCIS   Cancer ([place]) 2019   left breast   Depression    Dysrhythmia    SVT, s/p ablation ~ 2012 in at [facility]   Family history of breast cancer    Family history of melanoma    History of radiation therapy 04/22/18-06/03/18   Left Breast, left SCV, axilla 50 Gy in 25 fractions, Left breast boost 10 Gy in 5 fractions.    Hypothyroidism    Personal history of radiation therapy    PONV (postoperative nausea and vomiting)    Thyroid  disease     SURGICAL HISTORY: Past Surgical History:  Procedure Laterality Date   APPENDECTOMY     BREAST BIOPSY Right 2014   fibroadenoma   BREAST BIOPSY Left 01/16/2018   BREAST BIOPSY Right 02/02/2020   LCIS   BREAST BIOPSY Right 08/04/2020   x2 LCIS   BREAST BIOPSY Right 08/13/2020   x2   BREAST EXCISIONAL BIOPSY Left 1990   benign   BREAST EXCISIONAL BIOPSY Right 02/02/2020   LCIS   BREAST LUMPECTOMY Left 01/22/2018   BREAST LUMPECTOMY WITH AXILLARY LYMPH NODE BIOPSY Left 02/21/2018   Procedure: LEFT BREAST LUMPECTOMY WITH AXILLARY LYMPH NODE BIOPSY;  Surgeon: [name], MD;  Location: [place];  Service: General;  Laterality: Left;   BREAST LUMPECTOMY WITH RADIOACTIVE SEED LOCALIZATION Right 03/18/2020   Procedure: RIGHT BREAST LUMPECTOMY WITH RADIOACTIVE SEED LOCALIZATION;  Surgeon: [name], MD;  Location: [place];  Service: General;  Laterality: Right;   BREAST SURGERY     ENDOMETRIAL ABLATION     EYE SURGERY     GLAUCOMA SURGERY Bilateral    RADIOACTIVE SEED GUIDED EXCISIONAL BREAST  BIOPSY Right 04/28/2021   Procedure: RADIOACTIVE SEED GUIDED EXCISIONAL RIGHT BREAST BIOPSY X2;  Surgeon: [name], MD;  Location: [place];  Service: General;  Laterality: Right;   RE-EXCISION OF BREAST LUMPECTOMY Left 03/20/2018   Procedure: RE-EXCISION OF BREAST LUMPECTOMY;  Surgeon: [name], MD;  Location: [place];  Service: General;  Laterality: Left;    I have reviewed the social history and family history with the patient and they are unchanged from previous note.  ALLERGIES:  is allergic to morphine and related.  MEDICATIONS:  Current Outpatient Medications  Medication Sig Dispense Refill   nitrofurantoin, macrocrystal-monohydrate, (MACROBID) 100 MG capsule Take 1 capsule (100 mg total) by mouth 2 (two) times daily. 10 capsule 0   traMADol (ULTRAM) 50 MG tablet Take 0.5-1 tablets (25-50 mg total) by mouth every 6 (six) hours as  needed. 30 tablet 0   citalopram (CELEXA) 20 MG tablet TAKE 1 TABLET BY MOUTH EVERY DAY 90 tablet 0   cyclobenzaprine (FLEXERIL) 10 MG tablet Take 1 tablet (10 mg total) by mouth 3 (three) times daily as needed for muscle spasms. (Patient not taking: Reported on 08/01/2022) 30 tablet 0   diclofenac Sodium (VOLTAREN) 1 % GEL Apply 4 g topically 4 (four) times daily. (Patient not taking: Reported on 08/01/2022) 100 g 0   letrozole (FEMARA) 2.5 MG tablet TAKE 1 TABLET BY MOUTH EVERY DAY 90 tablet 3   levothyroxine (SYNTHROID) 75 MCG tablet TAKE 1 TABLET BY MOUTH EVERY DAY BEFORE BREAKFAST 90 tablet 0   meloxicam (MOBIC) 15 MG tablet Take 1 tablet (15 mg total) by mouth daily. 30 tablet 0   methylPREDNISolone (MEDROL DOSEPAK) 4 MG TBPK tablet Day 1: 54m before breakfast, 4 mg after lunch, 4 mg after supper, and 8 mg at bedtime Day 2: 4 mg before breakfast, 4 mg after lunch, 4 mg  after supper, and 8 mg  at bedtime Day 3:  4 mg  before breakfast, 4 mg  after lunch, 4 mg after supper, and 4 mg  at bedtime Day 4: 4 mg  before breakfast, 4 mg  after lunch, and 4 mg at bedtime Day 5: 4 mg  before breakfast and 4 mg at bedtime Day 6: 4 mg  before breakfast (Patient not taking: Reported on 08/01/2022) 1 each 0   oseltamivir (TAMIFLU) 75 MG capsule Take 1 capsule (75 mg total) by mouth 2 (two) times daily. (Patient not taking: Reported on 08/01/2022) 20 capsule 0   OVER THE COUNTER MEDICATION Take 1 tablet by mouth daily. Protandim Supplement     Probiotic Product (PROBIOTIC PO) Take 1 capsule by mouth daily.     No current facility-administered medications for this visit.    PHYSICAL EXAMINATION: ECOG PERFORMANCE STATUS: 1 - Symptomatic but completely ambulatory  Vitals:   08/04/22 1547  BP: (!) 147/74  Pulse: (!) 102  Resp: 19  Temp: 98.6 F (37 C)  SpO2: 99%   Wt Readings from Last 3 Encounters:  08/04/22 105 lb 4.8 oz (47.8 kg)  08/01/22 104 lb (47.2 kg)  07/25/22 103 lb (46.7 kg)      GENERAL:alert, no distress and comfortable SKIN: skin color normal, no rashes or significant lesions EYES: normal, Conjunctiva are pink and non-injected, sclera clear  NEURO: alert & oriented x 3 with fluent speech  LABORATORY DATA:  I have reviewed the data as listed    Latest Ref Rng & Units 08/02/2022   10:42 AM 04/12/2021  1:48 PM 05/21/2020    1:50 PM  CBC  WBC 4.0 - 10.5 K/uL 6.6  6.4  6.8   Hemoglobin 12.0 - 15.0 g/dL 14.5  14.6  14.6   Hematocrit 36.0 - 46.0 % 41.7  42.5  41.8   Platelets 150 - 400 K/uL 223  210.0  193         Latest Ref Rng & Units 08/02/2022   10:42 AM 04/12/2021    1:48 PM 05/21/2020    1:50 PM  CMP  Glucose 70 - 99 mg/dL 109  91  98   BUN 8 - 23 mg/dL _0 Creatinine 0.44 - 1.00 mg/dL 0.52  0.61  0.74   Sodium 135 - 145 mmol/L 137  138  138   Potassium 3.5 - 5.1 mmol/L 3.9  4.0  4.0   Chloride 98 - 111 mmol/L 101  100  102   CO2 22 - 32 mmol/L _1 Calcium 8.9 - 10.3 mg/dL 10.4  9.8  9.6   Total Protein 6.5 - 8.1 g/dL 7.4  7.3  7.2   Total Bilirubin 0.3 - 1.2 mg/dL 0.5  0.5  0.6   Alkaline Phos 38 - 126 U/L 241  84  96   AST 15 - 41 U/L _2 ALT 0 - 44 U/L _3 RADIOGRAPHIC STUDIES: I have personally reviewed the radiological images as listed and agreed with the findings in the report. NM PET Image Initial (PI) Skull Base To Thigh  Result Date: 08/04/2022 CLINICAL DATA:  Initial treatment strategy for spinal lesions with history of breast cancer. Evaluate for new primary versus metastatic disease. EXAM: NUCLEAR MEDICINE PET SKULL BASE TO THIGH TECHNIQUE: 5.2 mCi F-18 FDG was injected intravenously. Full-ring PET imaging was performed from the skull base to thigh after the radiotracer. CT data was obtained and used for attenuation correction and anatomic localization. Fasting blood glucose: 120 mg/dl COMPARISON:  Cervical spine MRI of 07/24/2022. Chest CT of 07/16/2020. FINDINGS: Mediastinal blood pool  activity: SUV max 2.0 Liver activity: SUV max NA NECK: A low right jugular node measures 5 mm and a S.U.V. max of 4.5 on 37/4. Incidental CT findings: No cervical adenopathy. CHEST: Right axillary node measures 6 mm and a S.U.V. max of 1.7 on 55/4. No mediastinal/hilar nodal or pulmonary parenchymal hypermetabolism. Incidental CT findings: Mild cardiomegaly. Aortic atherosclerosis. Right-sided pleural thickening and trace pleural fluid. Dependent right lower lobe airspace disease is likely due to chronic atelectasis. Mild centrilobular and paraseptal emphysema. Subpleural 4 mm right upper lobe pulmonary nodule on 53/4 is present in 2021 and considered benign. ABDOMEN/PELVIS: No abdominopelvic nodal hypermetabolism. Heterogeneous hepatic activity is equivocal but suspicious for underlying small volume hepatic metastasis. Example within the anterior left lobe at a S.U.V. max of 3.0 on approximately image 110/4 and within the posterior left lobe at a S.U.V. max of 3.0 on approximately image 116/4. Suspicion of subtle concurrent hypoattenuating foci throughout the liver including in the right lobe on 100/4 at 7-8 mm x 2. Incidental CT findings: Abdominal aortic atherosclerosis. Normal adrenal glands. Abdominal aortic atherosclerosis. Pelvic floor laxity. No free fluid or air. SKELETON: Large volume relatively diffuse osseous metastasis. Example lesions within C1-2 at a S.U.V. max of 11.7 on 17/4. Thoracolumbar vertebral body lesions also identified with an index L4 lesion measuring a S.U.V. max of 12.1. Incidental CT  findings: None. IMPRESSION: 1. Large volume osseous metastasis. 2. Low right cervical and probable right axillary nodal metastasis. 3. Subtle heterogeneous activity throughout the liver with suggestion of small liver lesions (likely new compared to chest CT of 07/16/2020). Findings are overall moderately suspicious for hepatic metastasis. Pre and post contrast abdominal MRI (preferred) or CT could confirm.  4. Right-sided pleural thickening and trace pleural fluid. Right base airspace disease is favored to represent chronic atelectasis. 5. Aortic atherosclerosis (ICD10-I70.0) and emphysema (ICD10-J43.9). Electronically Signed   By: Abigail Miyamoto M.D.   On: 08/04/2022 16:11      Orders Placed This Encounter  Procedures   Ambulatory referral to Social Work    Referral Priority:   Routine    Referral Type:   Consultation    Referral Reason:   Specialty Services Required    Number of Visits Requested:   1   All questions were answered. The patient knows to call the clinic with any problems, questions or concerns. No barriers to learning was detected. The total time spent in the appointment was 45 minutes.     Truitt Merle, MD 08/04/2022   Felicity Coyer, CMA, am acting as scribe for Truitt Merle, MD.   I have reviewed the above documentation for accuracy and completeness, and I agree with the above.

## 2022-08-04 NOTE — Assessment & Plan Note (Addendum)
-  presented with neck pain in July 2023 -MRI cervical spine on 07/24/2022 showed Extensive osseous metastatic disease with pathologic fracture at the base of dens and C2 right lateral mass. Extraosseous tumor at C1 and C2 likely impinging on the right C2 and C3 nerve roots. -PET scan from 08/03/2022 showed diffuse bone mets  -I recommend bone biopsy to confirm metastasis and repeat ER/PR/HER2 -I recommended Zometa infusion every 3 months to stress to her bone.  Benefit and side effect discussed with her, she agrees

## 2022-08-04 NOTE — Assessment & Plan Note (Signed)
--  She was diagnosed in 12/2017. She is s/p left breast lumpectomy and adjuvant radiation.  -She started anti-estrogen therapy with letrozole on 05/2018. Tolerating well with no issues -lost f/u after visit in 04/2020

## 2022-08-07 ENCOUNTER — Telehealth: Payer: Self-pay | Admitting: *Deleted

## 2022-08-07 NOTE — Telephone Encounter (Signed)
[id number]; LVM for patient to see if she has any questions about the study. Requested she call research nurse back at her convenience.  Foye Spurling, BSN, RN, [place] Nurse II [phone] 08/07/2022 2:05 PM

## 2022-08-08 ENCOUNTER — Ambulatory Visit (HOSPITAL_COMMUNITY)
Admission: RE | Admit: 2022-08-08 | Discharge: 2022-08-08 | Disposition: A | Discharge status: Home / Self Care | Payer: BC Managed Care – PPO | Source: Ambulatory Visit | Attending: Neurosurgery | Admitting: Neurosurgery

## 2022-08-08 DIAGNOSIS — M4802 Spinal stenosis, cervical region: Secondary | ICD-10-CM | POA: Diagnosis not present

## 2022-08-08 DIAGNOSIS — M4312 Spondylolisthesis, cervical region: Secondary | ICD-10-CM | POA: Diagnosis not present

## 2022-08-08 DIAGNOSIS — S12191A Other nondisplaced fracture of second cervical vertebra, initial encounter for closed fracture: Secondary | ICD-10-CM | POA: Diagnosis not present

## 2022-08-08 DIAGNOSIS — C50919 Malignant neoplasm of unspecified site of unspecified female breast: Secondary | ICD-10-CM | POA: Diagnosis not present

## 2022-08-08 DIAGNOSIS — M8448XA Pathological fracture, other site, initial encounter for fracture: Secondary | ICD-10-CM | POA: Diagnosis not present

## 2022-08-09 ENCOUNTER — Other Ambulatory Visit: Payer: Self-pay | Admitting: Family Medicine

## 2022-08-09 ENCOUNTER — Other Ambulatory Visit: Payer: Self-pay | Admitting: Sports Medicine

## 2022-08-10 ENCOUNTER — Inpatient Hospital Stay: Payer: BC Managed Care – PPO | Admitting: Licensed Clinical Social Worker

## 2022-08-10 ENCOUNTER — Other Ambulatory Visit: Payer: Self-pay | Admitting: Neurosurgery

## 2022-08-10 DIAGNOSIS — S12191A Other nondisplaced fracture of second cervical vertebra, initial encounter for closed fracture: Secondary | ICD-10-CM | POA: Diagnosis not present

## 2022-08-10 DIAGNOSIS — C7951 Secondary malignant neoplasm of bone: Secondary | ICD-10-CM

## 2022-08-10 NOTE — Progress Notes (Signed)
[place] CSW Progress Note  Clinical Education officer, museum  received a referral from medical provider to reach out to pt regarding supportive services and Regulatory affairs officer.    CSW spoke with pt over the phone to discuss support groups available as well as individual counseling.  CSW emailed a list of supportive services to pt after speaking on the phone and will reach out to pt in February to discuss enrolling in the [facility].  Pt provided with the number to sign up for an [facility] and will follow up to book an appointment for both herself and her husband.  CSW to remain available throughout duration of treatment to provide support as appropriate.     Henriette Combs, LCSW

## 2022-08-11 ENCOUNTER — Other Ambulatory Visit: Payer: Self-pay | Admitting: Family Medicine

## 2022-08-11 ENCOUNTER — Other Ambulatory Visit: Payer: Self-pay | Admitting: Sports Medicine

## 2022-08-11 ENCOUNTER — Other Ambulatory Visit: Payer: Self-pay | Admitting: Internal Medicine

## 2022-08-11 DIAGNOSIS — M542 Cervicalgia: Secondary | ICD-10-CM

## 2022-08-11 MED ORDER — CITALOPRAM HYDROBROMIDE 20 MG PO TABS: 20.0000 mg | ORAL_TABLET | Freq: Every day | ORAL | 3 refills | Status: DC

## 2022-08-11 MED ORDER — CYCLOBENZAPRINE HCL 10 MG PO TABS: 10.0000 mg | ORAL_TABLET | Freq: Three times a day (TID) | ORAL | 0 refills | Status: DC | PRN

## 2022-08-11 MED ORDER — LEVOTHYROXINE SODIUM 75 MCG PO TABS: 75.0000 ug | ORAL_TABLET | Freq: Every day | ORAL | 3 refills | Status: DC

## 2022-08-12 ENCOUNTER — Ambulatory Visit (HOSPITAL_COMMUNITY)
Admission: RE | Admit: 2022-08-12 | Discharge: 2022-08-12 | Disposition: A | Discharge status: Home / Self Care | Payer: BC Managed Care – PPO | Source: Ambulatory Visit | Attending: Neurosurgery | Admitting: Neurosurgery

## 2022-08-12 DIAGNOSIS — S12191A Other nondisplaced fracture of second cervical vertebra, initial encounter for closed fracture: Secondary | ICD-10-CM | POA: Diagnosis not present

## 2022-08-12 DIAGNOSIS — M47812 Spondylosis without myelopathy or radiculopathy, cervical region: Secondary | ICD-10-CM | POA: Diagnosis not present

## 2022-08-12 MED ORDER — GADOBUTROL 1 MMOL/ML IV SOLN
5.0000 mL | Freq: Once | INTRAVENOUS | Status: AC | PRN
  Administered 2022-08-12: 5 mL via INTRAVENOUS

## 2022-08-14 ENCOUNTER — Telehealth: Payer: Self-pay | Admitting: Genetic Counselor

## 2022-08-14 ENCOUNTER — Ambulatory Visit: Payer: Self-pay | Admitting: Genetic Counselor

## 2022-08-14 ENCOUNTER — Ambulatory Visit (HOSPITAL_COMMUNITY): Payer: BC Managed Care – PPO

## 2022-08-14 DIAGNOSIS — Z79899 Other long term (current) drug therapy: Secondary | ICD-10-CM | POA: Insufficient documentation

## 2022-08-14 DIAGNOSIS — Z1379 Encounter for other screening for genetic and chromosomal anomalies: Secondary | ICD-10-CM | POA: Insufficient documentation

## 2022-08-14 NOTE — Telephone Encounter (Signed)
Revealed negative genetic testing.  Discussed that we do not know why she has metastatic breast cancer or why there is cancer in the family. It could be due to a different gene that we are not testing, or maybe our current technology may not be able to pick something up.  It will be important for her to keep in contact with genetics to keep up with whether additional testing may be needed.    

## 2022-08-14 NOTE — Telephone Encounter (Signed)
LM on VM that results were back and to please call.  Left CB instuctions.

## 2022-08-14 NOTE — Progress Notes (Signed)
HPI:  Ms. Nipper was previously seen in the [place] clinic due to a personal and family history of cancer and concerns regarding a hereditary predisposition to cancer. Please refer to our prior cancer genetics clinic note for more information regarding our discussion, assessment and recommendations, at the time. Ms. Rossa recent genetic test results were disclosed to her, as were recommendations warranted by these results. These results and recommendations are discussed in more detail below.  CANCER HISTORY:  Oncology History Overview Note  Cancer Staging Malignant neoplasm of lower-inner quadrant of left breast in female, estrogen receptor positive ([place]) Staging form: Breast, AJCC 8th Edition - Clinical stage from 01/16/2018: Stage IA (cT1c, cN0, cM0, G2, ER+, PR+, HER2-) - Signed by Truitt Merle, MD on 01/23/2018 - Pathologic: Stage IA (pT1c, pN1, cM0, G1, ER+, PR+, HER2-) - Signed by Eppie Gibson, MD on 04/09/2018     Malignant neoplasm of lower-inner quadrant of left breast in female, estrogen receptor positive ([place])  01/15/2018 Mammogram   Diagnositc Mammogram 01/15/18  IMPRESSION: 1. Suspicious 1.2 x 1.4 x 1.3 cm mixed echogenicity mass left breast 7 o'clock position retroareolar location at the site of palpable concern.. 2. Indeterminate Within the left breast 7:30 o'clock retroareolar location, adjacent to the palpable mass, is a 0.5 x 0.4 x 0.5 cm oval circumscribed hypoechoic mass. 3. Indeterminate calcifications within the lateral left breast. Location of these calcifications is not definitely confirmed on the true lateral view.    01/16/2018 Initial Biopsy   Diagnosis 01/16/18 1. Breast, left, needle core biopsy, 7:30 o'clock (ribbon clip) - FIBROCYSTIC CHANGES WITH SCLEROSING ADENOSIS AND CALCIFICATIONS. - FIBROADENOMATOID CHANGE. - NO MALIGNANCY IDENTIFIED. 2. Breast, left, needle core biopsy, 7 o'clock position (coil clip) - INVASIVE MAMMARY CARCINOMA,  MSBR GRADE I/II. - SEE MICROSCOPIC DESCRIPTION Microscopic Comment  ADDENDUM: Immunohistochemistry for E-Cadherin is strongly positive in the tumor consistent with ductal carcinoma. (JDP:ah 01/17/18)   01/16/2018 Receptors her2   Estrogen Receptor: 100%, POSITIVE, STRONG STAINING INTENSITY Progesterone Receptor: 50%, POSITIVE, STRONG STAINING INTENSITY Proliferation Marker Ki67: 20% HER2 Negative   01/16/2018 Cancer Staging   Staging form: Breast, AJCC 8th Edition - Clinical stage from 01/16/2018: Stage IA (cT1c, cN0, cM0, G2, ER+, PR+, HER2-) - Signed by Truitt Merle, MD on 01/23/2018   01/22/2018 Initial Diagnosis   Malignant neoplasm of lower-inner quadrant of left breast in female, estrogen receptor positive ([place])   02/21/2018 Surgery    LEFT BREAST LUMPECTOMY WITH AXILLARY LYMPH NODE BIOPSY by Dr. Barry Dienes  02/21/18   02/21/2018 Pathology Results   Diagnosis 02/21/18 1. Breast, lumpectomy, Left - INVASIVE DUCTAL CARCINOMA, GRADE I, 1.6 CM. - DUCTAL CARCINOMA IN SITU, INTERMEDIATE NUCLEAR GRADE. - ANTERIOR AND MEDIAL RESECTION MARGINS ARE POSITIVE FOR CARCINOMA. - NEGATIVE FOR LYMPHOVASCULAR OR PERINEURAL INVASION. - BACKGROUND BREAST TISSUE WITH FIBROCYSTIC CHANGE, INCLUDING SCLEROSING ADENOSIS. - BIOPSY SITE CHANGES. - SEE ONCOLOGY TABLE. 2. Lymph node, sentinel, biopsy, Left Axillary #1 - METASTATIC BREAST CARCINOMA TO A LYMPH NODE, 1.0 CM IN GREATEST DIMENSION, WITH EXTRANODAL EXTENSION (1/1). 3. Lymph node, sentinel, biopsy, Left Axillary #2 - LYMPH NODE, NEGATIVE FOR CARCINOMA (0/1).    02/21/2018 Miscellaneous   Mammaprint 02/21/18 Low Risk with 10-year risk of recurrnce at 10% -No potential signifcant chemotherapy benefit   03/20/2018 Pathology Results   RE-EXCISION OF BREAST LUMPECTOMY by Dr. Barry Dienes  Diagnosis 03/20/18 1. Breast, excision, Left new anterior margin - FIBROCYSTIC CHANGES WITH ADENOSIS AND CALCIFICATIONS. - HEALING BIOPSY SITE. - THERE IS NO EVIDENCE OF  MALIGNANCY. 2. Breast, excision, Left new medial margin - FIBROCYSTIC CHANGES WITH ADENOSIS AND CALCIFICATIONS. - HEALING BIOPSY SITE. - THERE IS NO EVIDENCE OF MALIGNANCY. Microscopic Comment 1. -2. The surgical resection margin(s) of the specimen were inked and microscopically evaluated. (JBK:kh 03-22-18)   04/09/2018 Cancer Staging   Staging form: Breast, AJCC 8th Edition - Pathologic: Stage IA (pT1c, pN1, cM0, G1, ER+, PR+, HER2-) - Signed by Eppie Gibson, MD on 04/09/2018   04/22/2018 - 06/03/2018 Radiation Therapy   Radaiton with Dr. Isidore Moos 04/22/18-06/03/18   05/2018 -  Anti-estrogen oral therapy   Letrozole 2.49m started 05/2018    Survivorship   Per LCira Rue NP    05/04/2022 Imaging    IMPRESSION: Cervical spondylosis, as described.   Nonspecific straightening of the expected cervical lordosis.   07/24/2022 Imaging    IMPRESSION: 1. Extensive osseous metastatic disease with pathologic fracture at the base of dens and C2 right lateral mass. Extraosseous tumor at C1 and C2 likely impinging on the right C2 and C3 nerve roots. 2. Degenerative cord impingement at C4-5 to C6-7. Biforaminal impingement at C5-6 and C6-7.   08/03/2022 PET scan    IMPRESSION: 1. Large volume osseous metastasis. 2. Low right cervical and probable right axillary nodal metastasis. 3. Subtle heterogeneous activity throughout the liver with suggestion of small liver lesions (likely new compared to chest CT of 07/16/2020). Findings are overall moderately suspicious for hepatic metastasis. Pre and post contrast abdominal MRI (preferred) or CT could confirm. 4. Right-sided pleural thickening and trace pleural fluid. Right base airspace disease is favored to represent chronic atelectasis. 5. Aortic atherosclerosis (ICD10-I70.0) and emphysema (ICD10-J43.9).     Lobular carcinoma in situ (LCIS) of right breast  01/20/2020 Mammogram   Diagnostic Mammogram 01/20/20 IMPRESSION: 1.  Stable post  lumpectomy changes of the left breast.   2. Suspicious microcalcifications over the right upper outer quadrant spanning 3.6 cm.   02/02/2020 Initial Biopsy   Diagnosis 02/02/20 Breast, right, needle core biopsy, upper outer quadrant, x clip - LOBULAR CARCINOMA IN SITU WITH PLEOMORPHIC FEATURES AND CALCIFICATIONS, INVOLVING ADENOSIS. SEE NOTE Diagnosis Note Immunohistochemical stain for E-cadherin is negative in the lesional cells, consistent with a lobular phenotype. Immunostains for p63, SMM 1 and calponin do not show evidence of invasive carcinoma.    02/04/2020 Initial Diagnosis   Lobular carcinoma in situ (LCIS) of right breast   03/18/2020 Surgery   RIGHT BREAST LUMPECTOMY WITH RADIOACTIVE SEED LOCALIZATION by Dr BAlessandra Bevels   03/18/2020 Pathology Results   FINAL MICROSCOPIC DIAGNOSIS:   A. BREAST, RIGHT, LUMPECTOMY:  - Pleomorphic lobular carcinoma in situ with calcifications and  underlying complex sclerosing lesion, adenosis and fibroadenomatoid  change.  - Margins of resection are not involved (Closest margins: < 1 mm,  anterior, posterior, inferior and medial).  - Biopsy site.    COMMENT:   P63, Calponin and SMM-1 demonstrate the presence of myoepithelium in the  select focus.    [place] to bone [place]  05/04/2022 Imaging    IMPRESSION: Cervical spondylosis, as described.   Nonspecific straightening of the expected cervical lordosis.   07/24/2022 Imaging    IMPRESSION: 1. Extensive osseous metastatic disease with pathologic fracture at the base of dens and C2 right lateral mass. Extraosseous tumor at C1 and C2 likely impinging on the right C2 and C3 nerve roots. 2. Degenerative cord impingement at C4-5 to C6-7. Biforaminal impingement at C5-6 and C6-7.   08/02/2022 Initial Diagnosis   Cancer, metastatic to bone ([place] [employer]  08/03/2022 PET scan    IMPRESSION: 1. Large volume osseous metastasis. 2. Low right cervical and probable right axillary nodal  metastasis. 3. Subtle heterogeneous activity throughout the liver with suggestion of small liver lesions (likely new compared to chest CT of 07/16/2020). Findings are overall moderately suspicious for hepatic metastasis. Pre and post contrast abdominal MRI (preferred) or CT could confirm. 4. Right-sided pleural thickening and trace pleural fluid. Right base airspace disease is favored to represent chronic atelectasis. 5. Aortic atherosclerosis (ICD10-I70.0) and emphysema (ICD10-J43.9).       FAMILY HISTORY:  We obtained a detailed, 4-generation family history.  Significant diagnoses are listed below: Family History  Problem Relation Age of Onset   Heart disease Mother    Pneumonia Father    Hyperlipidemia Sister    Melanoma Sister 11       hx of 5 melanomas   Lung cancer Maternal Uncle        mesothelioma   Pneumonia Maternal Uncle    Dementia Paternal Grandmother    Lung disease Paternal Grandfather    Breast cancer Other        MGMs sister dx < 69   Breast cancer Other        PGFs sister dx < 44   Breast cancer Cousin        mother's mat first cousin, dx < 28       The patient does not have children.  She has two brothers and a sister.  Her sister has had melanoma 5 times.  Her father is deceased and her mother is living.   The patient's mother is currently living  She had two brothers, one had mesothelioma.  The maternal grandparents are deceased.  The grandmother's sister and niece had breast cancer under 44.   The patient's father died of pneumonia.  He had a brother and sister who were cancer free. The paternal grandparents did not have cancer but the grandfather's sister had breast cancer under 32.   Ms. Arras is unaware of previous family history of genetic testing for hereditary cancer risks. Patient's maternal ancestors are of [place], [place] and [place] descent, and paternal ancestors are of [place] descent. There is no reported Ashkenazi Jewish ancestry. There is  no known consanguinity.  GENETIC TEST RESULTS: Genetic testing reported out on August 11, 2022 through the CancerNext-Expanded+RNAinsight cancer panel found no pathogenic mutations. The CancerNext-Expanded gene panel offered by [facility] and includes sequencing and rearrangement analysis for the following 77 genes: AIP, ALK, APC*, ATM*, AXIN2, BAP1, BARD1, BLM, BMPR1A, BRCA1*, BRCA2*, BRIP1*, CDC73, CDH1*, CDK4, CDKN1B, CDKN2A, CHEK2*, CTNNA1, DICER1, FANCC, FH, FLCN, GALNT12, KIF1B, LZTR1, MAX, MEN1, MET, MLH1*, MSH2*, MSH3, MSH6*, MUTYH*, NBN, NF1*, NF2, NTHL1, PALB2*, PHOX2B, PMS2*, POT1, PRKAR1A, PTCH1, PTEN*, RAD51C*, RAD51D*, RB1, RECQL, RET, SDHA, SDHAF2, SDHB, SDHC, SDHD, SMAD4, SMARCA4, SMARCB1, SMARCE1, STK11, SUFU, TMEM127, TP53*, TSC1, TSC2, VHL and XRCC2 (sequencing and deletion/duplication); EGFR, EGLN1, HOXB13, KIT, MITF, PDGFRA, POLD1, and POLE (sequencing only); EPCAM and GREM1 (deletion/duplication only). DNA and RNA analyses performed for * genes. The test report has been scanned into EPIC and is located under the Molecular Pathology section of the Results Review tab.  A portion of the result report is included below for reference.     We discussed with Ms. Errickson that because current genetic testing is not perfect, it is possible there may be a gene mutation in one of these genes that current testing cannot detect, but that chance is small.  We also  discussed, that there could be another gene that has not yet been discovered, or that we have not yet tested, that is responsible for the cancer diagnoses in the family. It is also possible there is a hereditary cause for the cancer in the family that Ms. Delancey did not inherit and therefore was not identified in her testing.  Therefore, it is important to remain in touch with cancer genetics in the future so that we can continue to offer Ms. Blevens the most up to date genetic testing.   ADDITIONAL GENETIC TESTING: We discussed with Ms.  Bezio that her genetic testing was fairly extensive.  If there are genes identified to increase cancer risk that can be analyzed in the future, we would be happy to discuss and coordinate this testing at that time.    CANCER SCREENING RECOMMENDATIONS: Ms. Zackery test result is considered negative (normal).  This means that we have not identified a hereditary cause for her personal and family history of cancer at this time. Most cancers happen by chance and this negative test suggests that her cancer may fall into this category.    While reassuring, this does not definitively rule out a hereditary predisposition to cancer. It is still possible that there could be genetic mutations that are undetectable by current technology. There could be genetic mutations in genes that have not been tested or identified to increase cancer risk.  Therefore, it is recommended she continue to follow the cancer management and screening guidelines provided by her oncology and primary healthcare provider.   An individual's cancer risk and medical management are not determined by genetic test results alone. Overall cancer risk assessment incorporates additional factors, including personal medical history, family history, and any available genetic information that may result in a personalized plan for cancer prevention and surveillance  RECOMMENDATIONS FOR FAMILY MEMBERS:  Individuals in this family might be at some increased risk of developing cancer, over the general population risk, simply due to the family history of cancer.  We recommended women in this family have a yearly mammogram beginning at age 49, or 51 years younger than the earliest onset of cancer, an annual clinical breast exam, and perform monthly breast self-exams. Women in this family should also have a gynecological exam as recommended by their primary provider. All family members should be referred for colonoscopy starting at age 30.  It is also possible  there is a hereditary cause for the cancer in Ms. Mustapha's family that she did not inherit and therefore was not identified in her.  Based on Ms. Dillenburg's family history, we recommended her sister, who was diagnosed with melanoma five times, have genetic counseling and testing. Ms. Downum will let us know if we can be of any assistance in coordinating genetic counseling and/or testing for this family member.   FOLLOW-UP: Lastly, we discussed with Ms. Harbin that cancer genetics is a rapidly advancing field and it is possible that new genetic tests will be appropriate for her and/or her family members in the future. We encouraged her to remain in contact with cancer genetics on an annual basis so we can update her personal and family histories and let her know of advances in cancer genetics that may benefit this family.   Our contact number was provided. Ms. Carmen questions were answered to her satisfaction, and she knows she is welcome to call us at anytime with additional questions or concerns.   Roma Kayser, [place], [facility] Licensed, Certified Genetic Counselor [email or web address]

## 2022-08-15 ENCOUNTER — Other Ambulatory Visit: Payer: Self-pay | Admitting: Neurosurgery

## 2022-08-15 NOTE — Pre-Procedure Instructions (Signed)
Surgical Instructions    Your procedure is scheduled on Thursday, December 21st.  Report to [facility] Main Entrance "A" at 12:00 P.M., then check in with the Admitting office.  Call this number if you have problems the morning of surgery:  [phone]   If you have any questions prior to your surgery date call [phone]: Open Monday-Friday 8am-4pm    Remember:  Do not eat after midnight the night before your surgery  You may drink clear liquids until 11:00 AM the morning of your surgery.   Clear liquids allowed are: Water, Non-Citrus Juices (without pulp), Carbonated Beverages, Clear Tea, Black Coffee Only (NO MILK, CREAM OR POWDERED CREAMER of any kind), and Gatorade.    Take these medicines the morning of surgery with A SIP OF WATER  citalopram (CELEXA)  letrozole (FEMARA)  levothyroxine (SYNTHROID)    If needed: traMADol (ULTRAM)    As of today, STOP taking any Aspirin (unless otherwise instructed by your surgeon) Aleve, Naproxen, Ibuprofen, Motrin, Advil, Goody's, BC's, all herbal medications, fish oil, and all vitamins. This includes meloxicam (MOBIC).                     Do NOT Smoke (Tobacco/Vaping) for 24 hours prior to your procedure.  If you use a CPAP at night, you may bring your mask/headgear for your overnight stay.   Contacts, glasses, piercing's, hearing aid's, dentures or partials may not be worn into surgery, please bring cases for these belongings.    For patients admitted to the hospital, discharge time will be determined by your treatment team.   Patients discharged the day of surgery will not be allowed to drive home, and someone needs to stay with them for 24 hours.  SURGICAL WAITING ROOM VISITATION Patients having surgery or a procedure may have no more than 2 support people in the waiting area - these visitors may rotate.   Children under the age of 26 must have an adult with them who is not the patient. If the patient needs to stay at the  hospital during part of their recovery, the visitor guidelines for inpatient rooms apply. Pre-op nurse will coordinate an appropriate time for 1 support person to accompany patient in pre-op.  This support person may not rotate.   Please refer to the [facility] website for the visitor guidelines for Inpatients (after your surgery is over and you are in a regular room).    Special instructions:   [facility]- Preparing For Surgery  Before surgery, you can play an important role. Because skin is not sterile, your skin needs to be as free of germs as possible. You can reduce the number of germs on your skin by washing with CHG (chlorahexidine gluconate) Soap before surgery.  CHG is an antiseptic cleaner which kills germs and bonds with the skin to continue killing germs even after washing.    Oral Hygiene is also important to reduce your risk of infection.  Remember - BRUSH YOUR TEETH THE MORNING OF SURGERY WITH YOUR REGULAR TOOTHPASTE  Please do not use if you have an allergy to CHG or antibacterial soaps. If your skin becomes reddened/irritated stop using the CHG.  Do not shave (including legs and underarms) for at least 48 hours prior to first CHG shower. It is OK to shave your face.  Please follow these instructions carefully.   Shower the NIGHT BEFORE SURGERY and the MORNING OF SURGERY  If you chose to wash your hair, wash your hair  first as usual with your normal shampoo.  After you shampoo, rinse your hair and body thoroughly to remove the shampoo.  Use CHG Soap as you would any other liquid soap. You can apply CHG directly to the skin and wash gently with a scrungie or a clean washcloth.   Apply the CHG Soap to your body ONLY FROM THE NECK DOWN.  Do not use on open wounds or open sores. Avoid contact with your eyes, ears, mouth and genitals (private parts). Wash Face and genitals (private parts)  with your normal soap.   Wash thoroughly, paying special attention to the area where  your surgery will be performed.  Thoroughly rinse your body with warm water from the neck down.  DO NOT shower/wash with your normal soap after using and rinsing off the CHG Soap.  Pat yourself dry with a CLEAN TOWEL.  Wear CLEAN PAJAMAS to bed the night before surgery  Place CLEAN SHEETS on your bed the night before your surgery  DO NOT SLEEP WITH PETS.   Day of Surgery: Take a shower with CHG soap. Do not wear jewelry or makeup Do not wear lotions, powders, perfumes, or deodorant. Do not shave 48 hours prior to surgery.   Do not bring valuables to the hospital. [facility] is not responsible for any belongings or valuables. Do not wear nail polish, gel polish, artificial nails, or any other type of covering on natural nails (fingers and toes) If you have artificial nails or gel coating that need to be removed by a nail salon, please have this removed prior to surgery. Artificial nails or gel coating may interfere with anesthesia's ability to adequately monitor your vital signs. Wear Clean/Comfortable clothing the morning of surgery Remember to brush your teeth WITH YOUR REGULAR TOOTHPASTE.   Please read over the following fact sheets that you were given.    If you received a COVID test during your pre-op visit  it is requested that you wear a mask when out in public, stay away from anyone that may not be feeling well and notify your surgeon if you develop symptoms. If you have been in contact with anyone that has tested positive in the last 10 days please notify you surgeon.

## 2022-08-16 ENCOUNTER — Encounter (HOSPITAL_COMMUNITY)
Admission: RE | Admit: 2022-08-16 | Discharge: 2022-08-16 | Disposition: A | Discharge status: Home / Self Care | Payer: BC Managed Care – PPO | Source: Ambulatory Visit | Attending: Neurosurgery | Admitting: Neurosurgery

## 2022-08-16 ENCOUNTER — Other Ambulatory Visit (HOSPITAL_COMMUNITY): Payer: BC Managed Care – PPO

## 2022-08-16 ENCOUNTER — Encounter (HOSPITAL_COMMUNITY): Payer: Self-pay

## 2022-08-16 ENCOUNTER — Other Ambulatory Visit: Payer: Self-pay

## 2022-08-16 VITALS — BP 151/87 | HR 107 | Temp 97.7°F | Resp 18 | Ht 63.0 in | Wt 102.0 lb

## 2022-08-16 DIAGNOSIS — F32A Depression, unspecified: Secondary | ICD-10-CM | POA: Diagnosis not present

## 2022-08-16 DIAGNOSIS — Z808 Family history of malignant neoplasm of other organs or systems: Secondary | ICD-10-CM | POA: Diagnosis not present

## 2022-08-16 DIAGNOSIS — Z981 Arthrodesis status: Secondary | ICD-10-CM | POA: Diagnosis not present

## 2022-08-16 DIAGNOSIS — Z79811 Long term (current) use of aromatase inhibitors: Secondary | ICD-10-CM | POA: Diagnosis not present

## 2022-08-16 DIAGNOSIS — M5481 Occipital neuralgia: Secondary | ICD-10-CM | POA: Diagnosis not present

## 2022-08-16 DIAGNOSIS — C50312 Malignant neoplasm of lower-inner quadrant of left female breast: Secondary | ICD-10-CM | POA: Diagnosis not present

## 2022-08-16 DIAGNOSIS — M8458XA Pathological fracture in neoplastic disease, other specified site, initial encounter for fracture: Secondary | ICD-10-CM | POA: Diagnosis not present

## 2022-08-16 DIAGNOSIS — Z791 Long term (current) use of non-steroidal anti-inflammatories (NSAID): Secondary | ICD-10-CM | POA: Diagnosis not present

## 2022-08-16 DIAGNOSIS — Z170 Estrogen receptor positive status [ER+]: Secondary | ICD-10-CM | POA: Diagnosis not present

## 2022-08-16 DIAGNOSIS — F17200 Nicotine dependence, unspecified, uncomplicated: Secondary | ICD-10-CM | POA: Diagnosis not present

## 2022-08-16 DIAGNOSIS — Z923 Personal history of irradiation: Secondary | ICD-10-CM | POA: Diagnosis not present

## 2022-08-16 DIAGNOSIS — Z7984 Long term (current) use of oral hypoglycemic drugs: Secondary | ICD-10-CM | POA: Diagnosis not present

## 2022-08-16 DIAGNOSIS — Z83438 Family history of other disorder of lipoprotein metabolism and other lipidemia: Secondary | ICD-10-CM | POA: Diagnosis not present

## 2022-08-16 DIAGNOSIS — M436 Torticollis: Secondary | ICD-10-CM | POA: Diagnosis not present

## 2022-08-16 DIAGNOSIS — F419 Anxiety disorder, unspecified: Secondary | ICD-10-CM | POA: Diagnosis not present

## 2022-08-16 DIAGNOSIS — Z803 Family history of malignant neoplasm of breast: Secondary | ICD-10-CM | POA: Diagnosis not present

## 2022-08-16 DIAGNOSIS — S12191A Other nondisplaced fracture of second cervical vertebra, initial encounter for closed fracture: Secondary | ICD-10-CM | POA: Diagnosis not present

## 2022-08-16 DIAGNOSIS — Z79890 Hormone replacement therapy (postmenopausal): Secondary | ICD-10-CM | POA: Diagnosis not present

## 2022-08-16 DIAGNOSIS — F418 Other specified anxiety disorders: Secondary | ICD-10-CM | POA: Diagnosis not present

## 2022-08-16 DIAGNOSIS — G9389 Other specified disorders of brain: Secondary | ICD-10-CM | POA: Diagnosis not present

## 2022-08-16 DIAGNOSIS — S12101A Unspecified nondisplaced fracture of second cervical vertebra, initial encounter for closed fracture: Secondary | ICD-10-CM | POA: Diagnosis not present

## 2022-08-16 DIAGNOSIS — S12100A Unspecified displaced fracture of second cervical vertebra, initial encounter for closed fracture: Secondary | ICD-10-CM | POA: Diagnosis present

## 2022-08-16 DIAGNOSIS — Z794 Long term (current) use of insulin: Secondary | ICD-10-CM | POA: Diagnosis not present

## 2022-08-16 DIAGNOSIS — Z8249 Family history of ischemic heart disease and other diseases of the circulatory system: Secondary | ICD-10-CM | POA: Diagnosis not present

## 2022-08-16 DIAGNOSIS — Z79899 Other long term (current) drug therapy: Secondary | ICD-10-CM | POA: Diagnosis not present

## 2022-08-16 DIAGNOSIS — C7989 Secondary malignant neoplasm of other specified sites: Secondary | ICD-10-CM | POA: Diagnosis not present

## 2022-08-16 DIAGNOSIS — C7931 Secondary malignant neoplasm of brain: Secondary | ICD-10-CM | POA: Diagnosis not present

## 2022-08-16 DIAGNOSIS — C7951 Secondary malignant neoplasm of bone: Secondary | ICD-10-CM | POA: Diagnosis not present

## 2022-08-16 DIAGNOSIS — Z01818 Encounter for other preprocedural examination: Secondary | ICD-10-CM | POA: Insufficient documentation

## 2022-08-16 DIAGNOSIS — E039 Hypothyroidism, unspecified: Secondary | ICD-10-CM | POA: Diagnosis not present

## 2022-08-16 DIAGNOSIS — Z801 Family history of malignant neoplasm of trachea, bronchus and lung: Secondary | ICD-10-CM | POA: Diagnosis not present

## 2022-08-16 DIAGNOSIS — M4802 Spinal stenosis, cervical region: Secondary | ICD-10-CM | POA: Diagnosis not present

## 2022-08-16 DIAGNOSIS — F17210 Nicotine dependence, cigarettes, uncomplicated: Secondary | ICD-10-CM | POA: Diagnosis not present

## 2022-08-16 DIAGNOSIS — C801 Malignant (primary) neoplasm, unspecified: Secondary | ICD-10-CM | POA: Diagnosis not present

## 2022-08-16 HISTORY — DX: Unspecified ectopic pregnancy without intrauterine pregnancy: O00.90

## 2022-08-16 LAB — SURGICAL PCR SCREEN
MRSA, PCR: NEGATIVE
Staphylococcus aureus: NEGATIVE

## 2022-08-16 NOTE — Progress Notes (Signed)
PCP - Dr. Dimas Chyle Cardiologist - denies  PPM/ICD - denies   Chest x-ray - denies EKG - 08/16/22 Stress Test - denies ECHO - denies Cardiac Cath - denies  Sleep Study - denies   DM- denies    ASA/Blood Thinner Instructions: n/a   ERAS Protcol - yes, no drink   COVID TEST- n/a   Anesthesia review: no  Patient denies shortness of breath, fever, cough and chest pain at PAT appointment   All instructions explained to the patient, with a verbal understanding of the material. Patient agrees to go over the instructions while at home for a better understanding.  The opportunity to ask questions was provided.

## 2022-08-16 NOTE — Progress Notes (Signed)
patient voiced understanding of new arrival time of 1000 tomorrow, clear liquids okay until 0900

## 2022-08-17 ENCOUNTER — Inpatient Hospital Stay (HOSPITAL_COMMUNITY)
Admission: RE | Admit: 2022-08-17 | Discharge: 2022-08-19 | DRG: 472 | Disposition: A | Discharge status: Home / Self Care | Payer: BC Managed Care – PPO | Attending: Neurosurgery | Admitting: Neurosurgery

## 2022-08-17 ENCOUNTER — Inpatient Hospital Stay (HOSPITAL_COMMUNITY): Payer: BC Managed Care – PPO | Admitting: Physician Assistant

## 2022-08-17 ENCOUNTER — Other Ambulatory Visit: Payer: Self-pay

## 2022-08-17 ENCOUNTER — Inpatient Hospital Stay (HOSPITAL_COMMUNITY): Payer: BC Managed Care – PPO | Admitting: Anesthesiology

## 2022-08-17 ENCOUNTER — Encounter (HOSPITAL_COMMUNITY)
Admission: RE | Disposition: A | Discharge status: Home / Self Care | Payer: Self-pay | Source: Home / Self Care | Attending: Neurosurgery

## 2022-08-17 ENCOUNTER — Inpatient Hospital Stay (HOSPITAL_COMMUNITY): Payer: BC Managed Care – PPO

## 2022-08-17 ENCOUNTER — Other Ambulatory Visit: Payer: Self-pay | Admitting: Radiation Therapy

## 2022-08-17 ENCOUNTER — Encounter (HOSPITAL_COMMUNITY): Payer: Self-pay

## 2022-08-17 DIAGNOSIS — M5481 Occipital neuralgia: Secondary | ICD-10-CM | POA: Diagnosis present

## 2022-08-17 DIAGNOSIS — M436 Torticollis: Secondary | ICD-10-CM | POA: Diagnosis present

## 2022-08-17 DIAGNOSIS — Z803 Family history of malignant neoplasm of breast: Secondary | ICD-10-CM

## 2022-08-17 DIAGNOSIS — Z79899 Other long term (current) drug therapy: Secondary | ICD-10-CM

## 2022-08-17 DIAGNOSIS — F17210 Nicotine dependence, cigarettes, uncomplicated: Secondary | ICD-10-CM | POA: Diagnosis present

## 2022-08-17 DIAGNOSIS — C7951 Secondary malignant neoplasm of bone: Secondary | ICD-10-CM | POA: Diagnosis present

## 2022-08-17 DIAGNOSIS — Z885 Allergy status to narcotic agent status: Secondary | ICD-10-CM

## 2022-08-17 DIAGNOSIS — Z170 Estrogen receptor positive status [ER+]: Secondary | ICD-10-CM | POA: Diagnosis not present

## 2022-08-17 DIAGNOSIS — Z791 Long term (current) use of non-steroidal anti-inflammatories (NSAID): Secondary | ICD-10-CM

## 2022-08-17 DIAGNOSIS — Z801 Family history of malignant neoplasm of trachea, bronchus and lung: Secondary | ICD-10-CM

## 2022-08-17 DIAGNOSIS — Z794 Long term (current) use of insulin: Secondary | ICD-10-CM

## 2022-08-17 DIAGNOSIS — E039 Hypothyroidism, unspecified: Secondary | ICD-10-CM | POA: Diagnosis present

## 2022-08-17 DIAGNOSIS — F419 Anxiety disorder, unspecified: Secondary | ICD-10-CM | POA: Diagnosis present

## 2022-08-17 DIAGNOSIS — S12100A Unspecified displaced fracture of second cervical vertebra, initial encounter for closed fracture: Secondary | ICD-10-CM | POA: Diagnosis present

## 2022-08-17 DIAGNOSIS — Z79811 Long term (current) use of aromatase inhibitors: Secondary | ICD-10-CM

## 2022-08-17 DIAGNOSIS — M8458XA Pathological fracture in neoplastic disease, other specified site, initial encounter for fracture: Principal | ICD-10-CM | POA: Diagnosis present

## 2022-08-17 DIAGNOSIS — M4802 Spinal stenosis, cervical region: Secondary | ICD-10-CM | POA: Diagnosis present

## 2022-08-17 DIAGNOSIS — Z808 Family history of malignant neoplasm of other organs or systems: Secondary | ICD-10-CM

## 2022-08-17 DIAGNOSIS — C7931 Secondary malignant neoplasm of brain: Secondary | ICD-10-CM | POA: Diagnosis present

## 2022-08-17 DIAGNOSIS — C50312 Malignant neoplasm of lower-inner quadrant of left female breast: Secondary | ICD-10-CM | POA: Diagnosis present

## 2022-08-17 DIAGNOSIS — Z83438 Family history of other disorder of lipoprotein metabolism and other lipidemia: Secondary | ICD-10-CM

## 2022-08-17 DIAGNOSIS — Z8249 Family history of ischemic heart disease and other diseases of the circulatory system: Secondary | ICD-10-CM

## 2022-08-17 DIAGNOSIS — Z7984 Long term (current) use of oral hypoglycemic drugs: Secondary | ICD-10-CM

## 2022-08-17 DIAGNOSIS — Z79890 Hormone replacement therapy (postmenopausal): Secondary | ICD-10-CM

## 2022-08-17 DIAGNOSIS — F32A Depression, unspecified: Secondary | ICD-10-CM | POA: Diagnosis present

## 2022-08-17 DIAGNOSIS — Z923 Personal history of irradiation: Secondary | ICD-10-CM

## 2022-08-17 DIAGNOSIS — C799 Secondary malignant neoplasm of unspecified site: Secondary | ICD-10-CM | POA: Diagnosis present

## 2022-08-17 HISTORY — PX: POSTERIOR CERVICAL FUSION/FORAMINOTOMY: SHX5038

## 2022-08-17 LAB — ABO/RH: ABO/RH(D): O NEG

## 2022-08-17 LAB — PREPARE RBC (CROSSMATCH)

## 2022-08-17 SURGERY — POSTERIOR CERVICAL FUSION/FORAMINOTOMY LEVEL 5
Anesthesia: General

## 2022-08-17 MED ORDER — ONDANSETRON HCL 4 MG/2ML IJ SOLN: 4.0000 mg | Freq: Four times a day (QID) | INTRAMUSCULAR | Status: DC | PRN

## 2022-08-17 MED ORDER — CHLORHEXIDINE GLUCONATE 0.12 % MT SOLN
OROMUCOSAL | Status: AC
  Administered 2022-08-17: 15 mL via OROMUCOSAL
  Filled 2022-08-17: qty 15

## 2022-08-17 MED ORDER — POTASSIUM CHLORIDE IN NACL 20-0.9 MEQ/L-% IV SOLN
INTRAVENOUS | Status: DC
  Filled 2022-08-17: qty 1000

## 2022-08-17 MED ORDER — CEFAZOLIN SODIUM-DEXTROSE 1-4 GM/50ML-% IV SOLN
1.0000 g | Freq: Three times a day (TID) | INTRAVENOUS | Status: AC
  Administered 2022-08-17 – 2022-08-18 (×3): 1 g via INTRAVENOUS
  Filled 2022-08-17 (×4): qty 50

## 2022-08-17 MED ORDER — DEXAMETHASONE SODIUM PHOSPHATE 10 MG/ML IJ SOLN
INTRAMUSCULAR | Status: DC | PRN
  Administered 2022-08-17: 10 mg via INTRAVENOUS

## 2022-08-17 MED ORDER — ACETAMINOPHEN 10 MG/ML IV SOLN
INTRAVENOUS | Status: DC | PRN
  Administered 2022-08-17: 1000 mg via INTRAVENOUS

## 2022-08-17 MED ORDER — CYCLOBENZAPRINE HCL 10 MG PO TABS
10.0000 mg | ORAL_TABLET | Freq: Three times a day (TID) | ORAL | Status: DC | PRN
  Filled 2022-08-17: qty 1

## 2022-08-17 MED ORDER — ONDANSETRON HCL 4 MG/2ML IJ SOLN
INTRAMUSCULAR | Status: AC
  Filled 2022-08-17: qty 2

## 2022-08-17 MED ORDER — ACETAMINOPHEN 10 MG/ML IV SOLN
INTRAVENOUS | Status: AC
  Filled 2022-08-17: qty 100

## 2022-08-17 MED ORDER — HYDROMORPHONE HCL 1 MG/ML IJ SOLN
INTRAMUSCULAR | Status: AC
  Filled 2022-08-17: qty 1

## 2022-08-17 MED ORDER — HYDROMORPHONE HCL 1 MG/ML IJ SOLN: 0.5000 mg | INTRAMUSCULAR | Status: DC | PRN

## 2022-08-17 MED ORDER — BOOST / RESOURCE BREEZE PO LIQD CUSTOM
1.0000 | Freq: Three times a day (TID) | ORAL | Status: DC
  Administered 2022-08-17 – 2022-08-19 (×5): 1 via ORAL

## 2022-08-17 MED ORDER — FENTANYL CITRATE (PF) 250 MCG/5ML IJ SOLN
INTRAMUSCULAR | Status: DC | PRN
  Administered 2022-08-17 (×4): 50 ug via INTRAVENOUS

## 2022-08-17 MED ORDER — ENOXAPARIN SODIUM 40 MG/0.4ML IJ SOSY
40.0000 mg | PREFILLED_SYRINGE | INTRAMUSCULAR | Status: DC
  Administered 2022-08-18: 40 mg via SUBCUTANEOUS
  Filled 2022-08-17: qty 0.4

## 2022-08-17 MED ORDER — MIDAZOLAM HCL 5 MG/5ML IJ SOLN
INTRAMUSCULAR | Status: DC | PRN
  Administered 2022-08-17 (×2): 1 mg via INTRAVENOUS

## 2022-08-17 MED ORDER — SODIUM CHLORIDE 0.9 % IV SOLN: 250.0000 mL | INTRAVENOUS | Status: DC

## 2022-08-17 MED ORDER — PROPOFOL 10 MG/ML IV BOLUS
INTRAVENOUS | Status: AC
  Filled 2022-08-17: qty 20

## 2022-08-17 MED ORDER — DEXAMETHASONE SODIUM PHOSPHATE 10 MG/ML IJ SOLN
INTRAMUSCULAR | Status: AC
  Filled 2022-08-17: qty 1

## 2022-08-17 MED ORDER — CHLORHEXIDINE GLUCONATE 0.12 % MT SOLN: 15.0000 mL | Freq: Once | OROMUCOSAL | Status: AC

## 2022-08-17 MED ORDER — PHENYLEPHRINE HCL-NACL 20-0.9 MG/250ML-% IV SOLN
INTRAVENOUS | Status: DC | PRN
  Administered 2022-08-17: 40 ug/min via INTRAVENOUS

## 2022-08-17 MED ORDER — CEFAZOLIN SODIUM-DEXTROSE 2-4 GM/100ML-% IV SOLN
2.0000 g | INTRAVENOUS | Status: AC
  Administered 2022-08-17: 2 g via INTRAVENOUS

## 2022-08-17 MED ORDER — THROMBIN 5000 UNITS EX SOLR
OROMUCOSAL | Status: DC | PRN
  Administered 2022-08-17 (×2): 5 mL via TOPICAL

## 2022-08-17 MED ORDER — LABETALOL HCL 5 MG/ML IV SOLN
5.0000 mg | INTRAVENOUS | Status: DC | PRN
  Administered 2022-08-17 (×3): 5 mg via INTRAVENOUS

## 2022-08-17 MED ORDER — EPHEDRINE SULFATE-NACL 50-0.9 MG/10ML-% IV SOSY: PREFILLED_SYRINGE | INTRAVENOUS | Status: DC | PRN

## 2022-08-17 MED ORDER — PHENOL 1.4 % MT LIQD
1.0000 | OROMUCOSAL | Status: DC | PRN
  Filled 2022-08-17: qty 177

## 2022-08-17 MED ORDER — BACITRACIN ZINC 500 UNIT/GM EX OINT
TOPICAL_OINTMENT | CUTANEOUS | Status: AC
  Filled 2022-08-17: qty 28.35

## 2022-08-17 MED ORDER — THROMBIN 5000 UNITS EX SOLR
CUTANEOUS | Status: AC
  Filled 2022-08-17: qty 5000

## 2022-08-17 MED ORDER — FLEET ENEMA 7-19 GM/118ML RE ENEM: 1.0000 | ENEMA | Freq: Once | RECTAL | Status: DC | PRN

## 2022-08-17 MED ORDER — BUPIVACAINE LIPOSOME 1.3 % IJ SUSP
INTRAMUSCULAR | Status: AC
  Filled 2022-08-17: qty 20

## 2022-08-17 MED ORDER — CYCLOBENZAPRINE HCL 10 MG PO TABS: 10.0000 mg | ORAL_TABLET | Freq: Three times a day (TID) | ORAL | Status: DC | PRN

## 2022-08-17 MED ORDER — LIDOCAINE 2% (20 MG/ML) 5 ML SYRINGE
INTRAMUSCULAR | Status: AC
  Filled 2022-08-17: qty 5

## 2022-08-17 MED ORDER — CITALOPRAM HYDROBROMIDE 20 MG PO TABS
20.0000 mg | ORAL_TABLET | Freq: Every day | ORAL | Status: DC
  Administered 2022-08-18 – 2022-08-19 (×2): 20 mg via ORAL
  Filled 2022-08-17 (×2): qty 1

## 2022-08-17 MED ORDER — NITROFURANTOIN MONOHYD MACRO 100 MG PO CAPS: 100.0000 mg | ORAL_CAPSULE | Freq: Two times a day (BID) | ORAL | Status: DC

## 2022-08-17 MED ORDER — MENTHOL 3 MG MT LOZG: 1.0000 | LOZENGE | OROMUCOSAL | Status: DC | PRN

## 2022-08-17 MED ORDER — CHLORHEXIDINE GLUCONATE CLOTH 2 % EX PADS: 6.0000 | MEDICATED_PAD | Freq: Once | CUTANEOUS | Status: DC

## 2022-08-17 MED ORDER — PROPOFOL 1000 MG/100ML IV EMUL
INTRAVENOUS | Status: AC
  Filled 2022-08-17: qty 100

## 2022-08-17 MED ORDER — HYDROMORPHONE HCL 1 MG/ML IJ SOLN
0.2500 mg | INTRAMUSCULAR | Status: DC | PRN
  Administered 2022-08-17 (×2): 0.5 mg via INTRAVENOUS

## 2022-08-17 MED ORDER — ACETAMINOPHEN 650 MG RE SUPP: 650.0000 mg | RECTAL | Status: DC | PRN

## 2022-08-17 MED ORDER — LACTATED RINGERS IV SOLN: INTRAVENOUS | Status: DC

## 2022-08-17 MED ORDER — ONDANSETRON HCL 4 MG PO TABS: 4.0000 mg | ORAL_TABLET | Freq: Four times a day (QID) | ORAL | Status: DC | PRN

## 2022-08-17 MED ORDER — PHENYLEPHRINE 80 MCG/ML (10ML) SYRINGE FOR IV PUSH (FOR BLOOD PRESSURE SUPPORT)
PREFILLED_SYRINGE | INTRAVENOUS | Status: DC | PRN
  Administered 2022-08-17: 80 ug via INTRAVENOUS

## 2022-08-17 MED ORDER — LETROZOLE 2.5 MG PO TABS
2.5000 mg | ORAL_TABLET | Freq: Every day | ORAL | Status: DC
  Administered 2022-08-18 – 2022-08-19 (×2): 2.5 mg via ORAL
  Filled 2022-08-17 (×2): qty 1

## 2022-08-17 MED ORDER — PROPOFOL 500 MG/50ML IV EMUL
INTRAVENOUS | Status: DC | PRN
  Administered 2022-08-17: 25 ug/kg/min via INTRAVENOUS

## 2022-08-17 MED ORDER — SODIUM CHLORIDE 0.9% FLUSH
3.0000 mL | Freq: Two times a day (BID) | INTRAVENOUS | Status: DC
  Administered 2022-08-17 – 2022-08-19 (×4): 3 mL via INTRAVENOUS

## 2022-08-17 MED ORDER — OSELTAMIVIR PHOSPHATE 75 MG PO CAPS: 75.0000 mg | ORAL_CAPSULE | Freq: Two times a day (BID) | ORAL | Status: DC

## 2022-08-17 MED ORDER — LIDOCAINE-EPINEPHRINE 1 %-1:100000 IJ SOLN
INTRAMUSCULAR | Status: AC
  Filled 2022-08-17: qty 1

## 2022-08-17 MED ORDER — 0.9 % SODIUM CHLORIDE (POUR BTL) OPTIME
TOPICAL | Status: DC | PRN
  Administered 2022-08-17: 1000 mL

## 2022-08-17 MED ORDER — ONDANSETRON HCL 4 MG/2ML IJ SOLN
INTRAMUSCULAR | Status: DC | PRN
  Administered 2022-08-17: 4 mg via INTRAVENOUS

## 2022-08-17 MED ORDER — BUPIVACAINE HCL (PF) 0.5 % IJ SOLN
INTRAMUSCULAR | Status: AC
  Filled 2022-08-17: qty 30

## 2022-08-17 MED ORDER — ROCURONIUM BROMIDE 10 MG/ML (PF) SYRINGE
PREFILLED_SYRINGE | INTRAVENOUS | Status: AC
  Filled 2022-08-17: qty 10

## 2022-08-17 MED ORDER — LABETALOL HCL 5 MG/ML IV SOLN
INTRAVENOUS | Status: AC
  Filled 2022-08-17: qty 4

## 2022-08-17 MED ORDER — LIDOCAINE 2% (20 MG/ML) 5 ML SYRINGE
INTRAMUSCULAR | Status: DC | PRN
  Administered 2022-08-17: 60 mg via INTRAVENOUS

## 2022-08-17 MED ORDER — LIDOCAINE-EPINEPHRINE 1 %-1:100000 IJ SOLN
INTRAMUSCULAR | Status: DC | PRN
  Administered 2022-08-17: 7 mL

## 2022-08-17 MED ORDER — MIDAZOLAM HCL 2 MG/2ML IJ SOLN
INTRAMUSCULAR | Status: AC
  Filled 2022-08-17: qty 2

## 2022-08-17 MED ORDER — ORAL CARE MOUTH RINSE: 15.0000 mL | Freq: Once | OROMUCOSAL | Status: AC

## 2022-08-17 MED ORDER — SODIUM CHLORIDE 0.9% FLUSH: 3.0000 mL | INTRAVENOUS | Status: DC | PRN

## 2022-08-17 MED ORDER — OXYCODONE HCL 5 MG PO TABS
5.0000 mg | ORAL_TABLET | ORAL | Status: DC | PRN
  Administered 2022-08-19 (×2): 5 mg via ORAL
  Filled 2022-08-17 (×2): qty 1

## 2022-08-17 MED ORDER — LEVOTHYROXINE SODIUM 75 MCG PO TABS
75.0000 ug | ORAL_TABLET | Freq: Every day | ORAL | Status: DC
  Administered 2022-08-18 – 2022-08-19 (×2): 75 ug via ORAL
  Filled 2022-08-17 (×2): qty 1

## 2022-08-17 MED ORDER — SUGAMMADEX SODIUM 200 MG/2ML IV SOLN
INTRAVENOUS | Status: DC | PRN
  Administered 2022-08-17: 200 mg via INTRAVENOUS

## 2022-08-17 MED ORDER — THROMBIN 5000 UNITS EX SOLR
CUTANEOUS | Status: AC
  Filled 2022-08-17: qty 10000

## 2022-08-17 MED ORDER — OXYCODONE HCL 5 MG PO TABS
10.0000 mg | ORAL_TABLET | ORAL | Status: DC | PRN
  Administered 2022-08-18 (×2): 10 mg via ORAL
  Filled 2022-08-17 (×2): qty 2

## 2022-08-17 MED ORDER — ACETAMINOPHEN 325 MG PO TABS
650.0000 mg | ORAL_TABLET | ORAL | Status: DC | PRN
  Administered 2022-08-18 – 2022-08-19 (×2): 650 mg via ORAL
  Filled 2022-08-17 (×2): qty 2

## 2022-08-17 MED ORDER — VANCOMYCIN HCL 1000 MG IV SOLR
INTRAVENOUS | Status: AC
  Filled 2022-08-17: qty 20

## 2022-08-17 MED ORDER — VANCOMYCIN HCL 1000 MG IV SOLR
INTRAVENOUS | Status: DC | PRN
  Administered 2022-08-17: 1000 mg via TOPICAL

## 2022-08-17 MED ORDER — ROCURONIUM BROMIDE 10 MG/ML (PF) SYRINGE
PREFILLED_SYRINGE | INTRAVENOUS | Status: DC | PRN
  Administered 2022-08-17: 70 mg via INTRAVENOUS
  Administered 2022-08-17: 50 mg via INTRAVENOUS
  Administered 2022-08-17: 30 mg via INTRAVENOUS

## 2022-08-17 MED ORDER — FENTANYL CITRATE (PF) 250 MCG/5ML IJ SOLN
INTRAMUSCULAR | Status: AC
  Filled 2022-08-17: qty 5

## 2022-08-17 MED ORDER — BUPIVACAINE HCL (PF) 0.5 % IJ SOLN
INTRAMUSCULAR | Status: DC | PRN
  Administered 2022-08-17: 20 mL

## 2022-08-17 MED ORDER — POLYETHYLENE GLYCOL 3350 17 G PO PACK: 17.0000 g | PACK | Freq: Every day | ORAL | Status: DC | PRN

## 2022-08-17 MED ORDER — PROPOFOL 10 MG/ML IV BOLUS
INTRAVENOUS | Status: DC | PRN
  Administered 2022-08-17 (×3): 30 mg via INTRAVENOUS
  Administered 2022-08-17: 140 mg via INTRAVENOUS
  Administered 2022-08-17: 40 mg via INTRAVENOUS

## 2022-08-17 MED ORDER — CEFAZOLIN SODIUM-DEXTROSE 2-4 GM/100ML-% IV SOLN
INTRAVENOUS | Status: AC
  Filled 2022-08-17: qty 100

## 2022-08-17 MED ORDER — DOCUSATE SODIUM 100 MG PO CAPS
100.0000 mg | ORAL_CAPSULE | Freq: Two times a day (BID) | ORAL | Status: DC
  Administered 2022-08-17 – 2022-08-19 (×4): 100 mg via ORAL
  Filled 2022-08-17 (×4): qty 1

## 2022-08-17 MED ORDER — BUPIVACAINE LIPOSOME 1.3 % IJ SUSP
INTRAMUSCULAR | Status: DC | PRN
  Administered 2022-08-17: 20 mL

## 2022-08-17 SURGICAL SUPPLY — 69 items
BAG COUNTER SPONGE SURGICOUNT (BAG) ×1 IMPLANT
BAND RUBBER #18 3X1/16 STRL (MISCELLANEOUS) IMPLANT
BENZOIN TINCTURE PRP APPL 2/3 (GAUZE/BANDAGES/DRESSINGS) ×1 IMPLANT
BIT DRILL NEURO 2X3.1 SFT TUCH (MISCELLANEOUS) ×1 IMPLANT
BIT DRILL WIRE PASS 1.3MM (BIT) IMPLANT
BIT DRILL YUKON ADJ 2.8 DISP (DRILL) IMPLANT
BLADE CLIPPER SURG (BLADE) IMPLANT
CANISTER SUCT 3000ML PPV (MISCELLANEOUS) ×1 IMPLANT
DERMABOND ADVANCED .7 DNX12 (GAUZE/BANDAGES/DRESSINGS) ×1 IMPLANT
DRAIN JACKSON PRT FLT 7MM (DRAIN) IMPLANT
DRAPE C-ARM 42X72 X-RAY (DRAPES) ×2 IMPLANT
DRAPE LAPAROTOMY 100X72 PEDS (DRAPES) ×1 IMPLANT
DRAPE MICROSCOPE SLANT 54X150 (MISCELLANEOUS) IMPLANT
DRAPE SURG 17X23 STRL (DRAPES) ×1 IMPLANT
DRILL NEURO 2X3.1 SOFT TOUCH (MISCELLANEOUS) ×1
DRILL WIRE PASS 1.3MM (BIT)
DRILL YUKON ADJ 2.8 DISP (DRILL) ×1
DRSG OPSITE POSTOP 4X6 (GAUZE/BANDAGES/DRESSINGS) ×1 IMPLANT
DURAPREP 26ML APPLICATOR (WOUND CARE) ×1 IMPLANT
ELECT COATED BLADE 2.86 ST (ELECTRODE) ×1 IMPLANT
ELECT REM PT RETURN 9FT ADLT (ELECTROSURGICAL) ×1
ELECTRODE REM PT RTRN 9FT ADLT (ELECTROSURGICAL) ×1 IMPLANT
EVACUATOR SILICONE 100CC (DRAIN) IMPLANT
GAUZE 4X4 16PLY ~~LOC~~+RFID DBL (SPONGE) IMPLANT
GLOVE BIOGEL PI IND STRL 7.5 (GLOVE) ×2 IMPLANT
GLOVE ECLIPSE 7.5 STRL STRAW (GLOVE) ×1 IMPLANT
GLOVE ECLIPSE 9.0 STRL (GLOVE) IMPLANT
GLOVE EXAM NITRILE XL STR (GLOVE) IMPLANT
GLOVE SURG ENC MOIS LTX SZ8 (GLOVE) ×1 IMPLANT
GLOVE SURG UNDER POLY LF SZ8.5 (GLOVE) ×1 IMPLANT
GOWN STRL REUS W/ TWL LRG LVL3 (GOWN DISPOSABLE) ×2 IMPLANT
GOWN STRL REUS W/ TWL XL LVL3 (GOWN DISPOSABLE) ×2 IMPLANT
GOWN STRL REUS W/TWL 2XL LVL3 (GOWN DISPOSABLE) IMPLANT
GOWN STRL REUS W/TWL LRG LVL3 (GOWN DISPOSABLE) ×1
GOWN STRL REUS W/TWL XL LVL3 (GOWN DISPOSABLE) ×3
GRAFT BN 5X1XSPNE CVD POST DBM (Bone Implant) IMPLANT
GRAFT BONE MAGNIFUSE 1X5CM (Bone Implant) ×1 IMPLANT
HEMOSTAT POWDER KIT SURGIFOAM (HEMOSTASIS) ×1 IMPLANT
KIT BASIN OR (CUSTOM PROCEDURE TRAY) ×1 IMPLANT
KIT TURNOVER KIT B (KITS) ×1 IMPLANT
NDL HYPO 21X1.5 SAFETY (NEEDLE) ×1 IMPLANT
NDL SPNL 18GX3.5 QUINCKE PK (NEEDLE) IMPLANT
NEEDLE HYPO 21X1.5 SAFETY (NEEDLE) ×2 IMPLANT
NEEDLE HYPO 22GX1.5 SAFETY (NEEDLE) ×1 IMPLANT
NEEDLE SPNL 18GX3.5 QUINCKE PK (NEEDLE) IMPLANT
NS IRRIG 1000ML POUR BTL (IV SOLUTION) ×1 IMPLANT
PACK LAMINECTOMY NEURO (CUSTOM PROCEDURE TRAY) ×1 IMPLANT
PAD ARMBOARD 7.5X6 YLW CONV (MISCELLANEOUS) ×3 IMPLANT
PATTIES SURGICAL .5 X3 (DISPOSABLE) IMPLANT
PIN MAYFIELD SKULL DISP (PIN) ×1 IMPLANT
ROD YUKON CONTOURED O 3.5X55 (Rod) IMPLANT
SCREW PA YUKON 3.5X20 (Screw) IMPLANT
SCREW PA YUKON LG 3.5X34 (Screw) IMPLANT
SCREW SET SPINAL YUKON (Set) IMPLANT
SCREW SPIN YUKON POLY 03.5X14 (Screw) IMPLANT
SPIKE FLUID TRANSFER (MISCELLANEOUS) ×1 IMPLANT
SPONGE SURGIFOAM ABS GEL SZ50 (HEMOSTASIS) IMPLANT
SPONGE T-LAP 4X18 ~~LOC~~+RFID (SPONGE) IMPLANT
STRIP CLOSURE SKIN 1/2X4 (GAUZE/BANDAGES/DRESSINGS) ×1 IMPLANT
SUT MNCRL AB 4-0 PS2 18 (SUTURE) ×1 IMPLANT
SUT VIC AB 0 CT1 18XCR BRD8 (SUTURE) ×1 IMPLANT
SUT VIC AB 0 CT1 8-18 (SUTURE) ×2
SUT VIC AB 2-0 CP2 18 (SUTURE) ×1 IMPLANT
SYR 20ML LL LF (SYRINGE) ×1 IMPLANT
SYR 3ML LL SCALE MARK (SYRINGE) IMPLANT
TAP YUKON 3.0 DISP (TAP) IMPLANT
TOWEL GREEN STERILE (TOWEL DISPOSABLE) ×1 IMPLANT
TOWEL GREEN STERILE FF (TOWEL DISPOSABLE) ×1 IMPLANT
WATER STERILE IRR 1000ML POUR (IV SOLUTION) ×1 IMPLANT

## 2022-08-17 NOTE — Anesthesia Postprocedure Evaluation (Signed)
Anesthesia Post Note  Patient: Martha [name]  Procedure(s) Performed: Cervical One-Cervical Four POSTERIOR CERVICAL FUSION,  Cervical One LAMINECTOMY REDUCTION OF Cervical Two Fracture, Biopsy of Right Cerical Two Pars  Lesion     Patient location during evaluation: PACU Anesthesia Type: General Level of consciousness: awake and alert Pain management: pain level controlled Vital Signs Assessment: post-procedure vital signs reviewed and stable Respiratory status: spontaneous breathing, nonlabored ventilation and respiratory function stable Cardiovascular status: blood pressure returned to baseline and stable Postop Assessment: no apparent nausea or vomiting Anesthetic complications: no  No notable events documented.  Last Vitals:  Vitals:   08/17/22 1730 08/17/22 1745  BP: (!) 169/100 (!) 161/94  Pulse: 98 96  Resp: 15 12  Temp:    SpO2: 96% 94%    Last Pain:  Vitals:   08/17/22 1641  TempSrc:   PainSc: 0-No pain                 Johneric Mcfadden,W. EDMOND

## 2022-08-17 NOTE — Transfer of Care (Signed)
Immediate Anesthesia Transfer of Care Note  Patient: Martha [name]  Procedure(s) Performed: Cervical One-Cervical Four POSTERIOR CERVICAL FUSION,  Cervical One LAMINECTOMY REDUCTION OF Cervical Two Fracture, Biopsy of Right Cerical Two Pars  Lesion  Patient Location: PACU  Anesthesia Type:General  Level of Consciousness: drowsy and patient cooperative  Airway & Oxygen Therapy: Patient Spontanous Breathing  Post-op Assessment: Report given to RN and Post -op Vital signs reviewed and stable  Post vital signs: Reviewed and stable  Last Vitals:  Vitals Value Taken Time  BP 151/91 08/17/22 1641  Temp    Pulse 98 08/17/22 1643  Resp 15 08/17/22 1643  SpO2 92 % 08/17/22 1643  Vitals shown include unvalidated device data.  Last Pain:  Vitals:   08/17/22 1042  TempSrc:   PainSc: 2          Complications: No notable events documented.

## 2022-08-17 NOTE — Anesthesia Preprocedure Evaluation (Addendum)
Anesthesia Evaluation  Patient identified by MRN, date of birth, ID band Patient awake    Reviewed: Allergy & Precautions, H&P , NPO status , Patient's Chart, lab work & pertinent test results  History of Anesthesia Complications (+) PONV and history of anesthetic complications  Airway Mallampati: II  TM Distance: >3 FB Neck ROM: Limited    Dental no notable dental hx. (+) Teeth Intact, Dental Advisory Given   Pulmonary Current Smoker and Patient abstained from smoking.   Pulmonary exam normal breath sounds clear to auscultation       Cardiovascular negative cardio ROS  Rhythm:Regular Rate:Normal     Neuro/Psych   Anxiety Depression    negative neurological ROS     GI/Hepatic negative GI ROS, Neg liver ROS,,,  Endo/Other  Hypothyroidism    Renal/GU negative Renal ROS  negative genitourinary   Musculoskeletal   Abdominal   Peds  Hematology negative hematology ROS (+)   Anesthesia Other Findings   Reproductive/Obstetrics negative OB ROS                             Anesthesia Physical Anesthesia Plan  ASA: 2  Anesthesia Plan: General   Post-op Pain Management: Ofirmev IV (intra-op)*   Induction: Intravenous  PONV Risk Score and Plan: 4 or greater and Ondansetron, Dexamethasone, Propofol infusion and Midazolam  Airway Management Planned: Oral ETT and Video Laryngoscope Planned  Additional Equipment: Arterial line  Intra-op Plan:   Post-operative Plan: Extubation in OR and Possible Post-op intubation/ventilation  Informed Consent: I have reviewed the patients History and Physical, chart, labs and discussed the procedure including the risks, benefits and alternatives for the proposed anesthesia with the patient or authorized representative who has indicated his/her understanding and acceptance.     Dental advisory given  Plan Discussed with: CRNA  Anesthesia Plan Comments:         Anesthesia Quick Evaluation

## 2022-08-17 NOTE — Anesthesia Procedure Notes (Addendum)
Arterial Line Insertion Start/End12/21/2023 11:30 AM, 08/17/2022 11:35 AM Performed by: Dorthea Cove, CRNA  Patient location: Pre-op. Preanesthetic checklist: patient identified, IV checked, site marked, risks and benefits discussed, surgical consent, monitors and equipment checked, pre-op evaluation, timeout performed and anesthesia consent Lidocaine 1% used for infiltration Left, radial was placed Catheter size: 20 G Hand hygiene performed  and maximum sterile barriers used   Attempts: 1 Procedure performed without using ultrasound guided technique. Following insertion, dressing applied and Biopatch. Post procedure assessment: normal and unchanged  Patient tolerated the procedure well with no immediate complications.

## 2022-08-17 NOTE — H&P (Signed)
CC: C2 pathologic fracture  HPI:     Patient is a 61 y.o. female presents with progressive neck pain and right occipital neuralgia.  She was found to have diffuse metastatic osseous disease including a pathologic C2 fracture.    Patient Active Problem List   Diagnosis Date Noted   Genetic testing 08/14/2022   Cancer, metastatic to bone ([place]) 08/02/2022   Family history of melanoma 08/02/2022   Family history of breast cancer 08/02/2022   Cervicalgia 06/16/2022   Stress 05/04/2022   Hyperglycemia 04/14/2021   Lobular carcinoma in situ (LCIS) of right breast 02/04/2020   Malignant neoplasm of lower-inner quadrant of left breast in female, estrogen receptor positive ([place]) 01/22/2018   Hypothyroidism 04/04/2017   Nicotine dependence with current use 04/04/2017   Excessive drinking of alcohol 04/04/2017   Menopausal symptom 04/04/2017   Past Medical History:  Diagnosis Date   Anxiety    Cancer ([place]) 2022   right breast LCIS   Cancer ([place]) 2019   left breast   Depression    Dysrhythmia    SVT, s/p ablation ~ 2012 in at [facility]   Ectopic pregnancy    Family history of breast cancer    Family history of melanoma    History of radiation therapy 04/22/18-06/03/18   Left Breast, left SCV, axilla 50 Gy in 25 fractions, Left breast boost 10 Gy in 5 fractions.    Hypothyroidism    Personal history of radiation therapy    PONV (postoperative nausea and vomiting)    Thyroid disease     Past Surgical History:  Procedure Laterality Date   APPENDECTOMY     BILATERAL SALPINGECTOMY     BREAST BIOPSY Right 2014   fibroadenoma   BREAST BIOPSY Left 01/16/2018   BREAST BIOPSY Right 02/02/2020   LCIS   BREAST BIOPSY Right 08/04/2020   x2 LCIS   BREAST BIOPSY Right 08/13/2020   x2   BREAST EXCISIONAL BIOPSY Left 1990   benign   BREAST EXCISIONAL BIOPSY Right 02/02/2020   LCIS   BREAST LUMPECTOMY Left 01/22/2018   BREAST LUMPECTOMY WITH AXILLARY LYMPH NODE BIOPSY  Left 02/21/2018   Procedure: LEFT BREAST LUMPECTOMY WITH AXILLARY LYMPH NODE BIOPSY;  Surgeon: [name], MD;  Location: [place];  Service: General;  Laterality: Left;   BREAST LUMPECTOMY WITH RADIOACTIVE SEED LOCALIZATION Right 03/18/2020   Procedure: RIGHT BREAST LUMPECTOMY WITH RADIOACTIVE SEED LOCALIZATION;  Surgeon: [name], MD;  Location: [place];  Service: General;  Laterality: Right;   BREAST SURGERY Left 1993   cyst removed   ENDOMETRIAL ABLATION     EYE SURGERY     GLAUCOMA SURGERY Bilateral    RADIOACTIVE SEED GUIDED EXCISIONAL BREAST BIOPSY Right 04/28/2021   Procedure: RADIOACTIVE SEED GUIDED EXCISIONAL RIGHT BREAST BIOPSY X2;  Surgeon: [name], MD;  Location: [place];  Service: General;  Laterality: Right;   RE-EXCISION OF BREAST LUMPECTOMY Left 03/20/2018   Procedure: RE-EXCISION OF BREAST LUMPECTOMY;  Surgeon: [name], MD;  Location: [place];  Service: General;  Laterality: Left;    Medications Prior to Admission  Medication Sig Dispense Refill Last Dose   citalopram (CELEXA) 20 MG tablet Take 1 tablet (20 mg total) by mouth daily. 90 tablet 3 08/17/2022 at 0830   letrozole (FEMARA) 2.5 MG tablet TAKE 1 TABLET BY MOUTH EVERY DAY 90 tablet 3 08/17/2022 at 0830   levothyroxine (SYNTHROID) 75 MCG tablet Take 1 tablet (75 mcg total) by mouth  daily before breakfast. 90 tablet 3 08/17/2022 at 0800   meloxicam (MOBIC) 15 MG tablet Take 1 tablet (15 mg total) by mouth daily. 30 tablet 0 08/16/2022   OVER THE COUNTER MEDICATION Take 1 tablet by mouth daily. Protandim Supplement   08/16/2022   Probiotic Product (PROBIOTIC PO) Take 1 capsule by mouth daily.   08/16/2022   traMADol (ULTRAM) 50 MG tablet Take 0.5-1 tablets (25-50 mg total) by mouth every 6 (six) hours as needed. 30 tablet 0 Past Month   cyclobenzaprine (FLEXERIL) 10 MG tablet Take 1 tablet (10 mg total) by mouth 3 (three) times daily as needed for muscle spasms. (Patient  not taking: Reported on 08/14/2022) 30 tablet 0 Not Taking   diclofenac Sodium (VOLTAREN) 1 % GEL Apply 4 g topically 4 (four) times daily. (Patient not taking: Reported on 08/01/2022) 100 g 0    methylPREDNISolone (MEDROL DOSEPAK) 4 MG TBPK tablet Day 1: '8mg'$  before breakfast, 4 mg after lunch, 4 mg after supper, and 8 mg at bedtime Day 2: 4 mg before breakfast, 4 mg after lunch, 4 mg  after supper, and 8 mg  at bedtime Day 3:  4 mg  before breakfast, 4 mg  after lunch, 4 mg after supper, and 4 mg  at bedtime Day 4: 4 mg  before breakfast, 4 mg  after lunch, and 4 mg at bedtime Day 5: 4 mg  before breakfast and 4 mg at bedtime Day 6: 4 mg  before breakfast (Patient not taking: Reported on 08/01/2022) 1 each 0    nitrofurantoin, macrocrystal-monohydrate, (MACROBID) 100 MG capsule Take 1 capsule (100 mg total) by mouth 2 (two) times daily. (Patient not taking: Reported on 08/14/2022) 10 capsule 0 Not Taking   oseltamivir (TAMIFLU) 75 MG capsule Take 1 capsule (75 mg total) by mouth 2 (two) times daily. (Patient not taking: Reported on 08/01/2022) 20 capsule 0    Allergies  Allergen Reactions   Morphine And Related     Makes her angry    Social History   Tobacco Use   Smoking status: Every Day    Packs/day: 1.00    Years: 40.00    Total pack years: 40.00    Types: Cigarettes   Smokeless tobacco: Never  Substance Use Topics   Alcohol use: Yes    Alcohol/week: 28.0 standard drinks of alcohol    Types: 28 Cans of beer per week    Comment: drinks 4 beers daily    Family History  Problem Relation Age of Onset   Heart disease Mother    Pneumonia Father    Hyperlipidemia Sister    Melanoma Sister 30       hx of 5 melanomas   Lung cancer Maternal Uncle        mesothelioma   Pneumonia Maternal Uncle    Dementia Paternal Grandmother    Lung disease Paternal Grandfather    Breast cancer Other        MGMs sister dx < 68   Breast cancer Other        PGFs sister dx < 24   Breast cancer Cousin         mother's mat first cousin, dx < 50     Review of Systems Pertinent items are noted in HPI.  Objective:   Patient Vitals for the past 8 hrs:  BP Temp Temp src Pulse Resp SpO2 Height Weight  08/17/22 1018 (!) 156/88 98.1 F (36.7 C) Oral 100 18 98 % 5'  3" (1.6 m) 46.3 kg   No intake/output data recorded. No intake/output data recorded.      General : Alert, cooperative, no distress, appears stated age   Head:  Normocephalic/atraumatic    Eyes: PERRL, conjunctiva/corneas clear, EOM's intact. Fundi could not be visualized Neck: Supple Chest:  Respirations unlabored Chest wall: no tenderness or deformity Heart: Regular rate and rhythm Abdomen: Soft, nontender and nondistended Extremities: warm and well-perfused Skin: normal turgor, color and texture Neurologic:  Alert, oriented x 3.  Eyes open spontaneously. PERRL, EOMI, VFC, no facial droop. V1-3 intact.  No dysarthria, tongue protrusion symmetric.  CNII-XII intact. Normal strength, sensation and reflexes throughout.  No pronator drift, full strength in legs.  Neck stiffness.       Data ReviewCBC:  Lab Results  Component Value Date   WBC 6.6 08/02/2022   WBC 6.4 04/12/2021   RBC 4.40 08/02/2022   BMP:  Lab Results  Component Value Date   GLUCOSE 109 (H) 08/02/2022   CO2 30 08/02/2022   BUN 10 08/02/2022   CREATININE 0.52 08/02/2022   CALCIUM 10.4 (H) 08/02/2022   Radiology review:  See clinic note  Assessment:   Active Problems:   * No active hospital problems. *  C2 pathologic fracture  Plan:   - to OR for stabilization

## 2022-08-17 NOTE — Op Note (Addendum)
Procedure(s): Cervical One-Cervical Four POSTERIOR CERVICAL FUSION,  Cervical One LAMINECTOMY REDUCTION OF Cervical Two Fracture, Biopsy of Right Cervical Two Pars  Lesion Procedure Note  Martha [name] female 61 y.o. 08/17/2022  Procedure(s) and Anesthesia Type:    * Cervical One-Cervical Four POSTERIOR CERVICAL FUSION,  Cervical One LAMINECTOMY REDUCTION OF Cervical Two Fracture, Biopsy of Right Cerical Two Pars  Lesion - General  Surgeon(s) and Role:    Marcello Moores, Dorcas Carrow, MD - Primary    Earnie Larsson, MD - Assisting   Indications:  This is a 61 year old woman with a history of breast cancer who developed occipital neuralgia and subsequently progressive mechanical neck pain.  She was found to have a pathologic fracture of C2 with instability.  She had diffuse osseous disease evident on PET scan as well as brain metastases on MRI.  She had excellent performance score, though she had noticed significant weight loss over the past several months.  I had a long discussion with the patient.  Given her diffuse spine involvement, including early body fractures of C5 and C6, reconstruction would be difficult.  However, with the diffuse nature of her disease, it was clear that the smallest possible stabilization surgery would be the best option for her.  This would involve C1-C4 posterior fusion, with possible extension to the occiput if inadequate purchase of the C1 lateral masses, and possible extension to C7 if inadequate lateral mass purchase.  I discussed the general technique of surgery, as well as risk, benefits, alternatives, expected convalescence.  Risk discussed included, but were not limited to, bleeding, pain, infection, scar, pseudoarthrosis, instability, stroke, neurologic deficit, spinal fluid leak, and death.  Informed consent was obtained.  She wished to proceed with surgery.      Surgeon: Vallarie Mare   Assistants: Earnie Larsson, MD.  Please note, there are no qualified  trainees available to assist with procedure.  Dr. Annette Stable assisted with instrumentation placement.  Anesthesia: General endotracheal anesthesia  Procedure Detail C1-2, C2-3, C3-4 posterior arthrodesis Open reduction of C2 pathologic fracture Segmental instrumentation C1-C2-C3-C4 C1 laminectomy Biopsy of right C2 pars mass Use of morselized allograft  The patient was brought to the operating room.  General anesthesia was induced and patient was intubated by the anesthesia service using C-spine precautions.  After appropriate lines and monitors were placed, Mayfield head clamp was placed.  Patient was addition prone on chest rolls and affixed to the bed in neutral position.  C-arm x-ray was used to perform mild traction and closed reduction of her fracture.  Her posterior neck was preprepped with alcohol, prepped and draped in sterile fashion.  1% lidocaine with epinephrine was injected.  Incision was made with a 10 blade and the subcutaneous tissue and fascia was opened sharply.  Paraspinous muscles were dissected off in a subperiosteal fashion from the C1, C2, C3, and C4 lamina.  Self-retaining retractors were placed.  As patient was having significant right occipital neuralgia, the right C2 nerve root was coagulated and cut which allowed easy access to the inferior aspect of the C1 lateral mass and the C1-2 joint.  Pilot hole was drilled and tapped and ball ended feeler confirmed good bony channels.  Using C-arm x-ray, a smooth shank screw was placed with good purchase.  On the left side, the C2 nerve was dissected in the foramen and retracted inferiorly which exposed the C1 lateral mass.  Pilot holes were placed, screw hole was tapped and ball ended feeler confirmed good bony channels  and bone at the depth.  Smooth shank screw was placed using C-arm guidance with good purchase.  The C2 medial pars was dissected out on the left side and a pilot hole for the C2 pars screw was made and the pars was drilled.   Following feeler confirmed good bony channels.  C2 pars screw was then placed with good purchase.  Using the Magerl technique, pilot holes for lateral mass screws were placed at C3 and C4 bilaterally.  The lateral masses were drilled in the appropriate trajectory and lateral mass screws were placed with good purchase.  Good reduction was apparent on x-ray but as patient did have moderate stenosis preoperatively, a C1 laminectomy was performed.  Good decompression was confirmed visually.  The dorsal cortex of the C2 pars was drilled and purplish tumor was collected the specimen.  Frozen section returned as adenocarcinoma, consistent with breast primary.  Meticulous hemostasis was obtained.  The wound was irrigated thoroughly.  Lateral masses and joints were decorticated with high speed drill. Rods were placed in the tulip heads secured with screw caps and final tightened into place.  Final AP and lateral x-rays were performed which confirmed good placement of instrumentation and partial reduction of her C2 fracture.  Magnifuse was placed in the lateral gutters bilaterally.  Vancomycin powder was sprinkled in the wound.  Similar JP was placed in the subfascial space and tunneled out the skin and secured with a stitch.  The muscle was closed with 0 Vicryl stitches.  The fascia was closed with 0 Vicryl stitches.  Dermal layer was closed with 2-0 Vicryl stitch in buried interrupted fashion.  The skin was closed with 4 Monocryl in subcuticular manner followed by Dermabond and a sterile dressing.  Patient was then removed from the Mayfield head holder and flipped supine and extubated by anesthesia service.  All counts were correct at the end of surgery.  No complications were noted.   Findings: Frozen section consistent with metastatic adenocarcinoma, likely breast primary  Estimated Blood Loss:  less than 100 mL         Drains: JACKSON-PRATT (JP)         Total IV Fluids: See anesthesia records  Blood Given:  none          Specimens: C2 pars mass         Implants:  Stryker 3.5 x 34 mm smooth shank screws at C1 3.5 x 20 mm left C2 pars screw 3.5 x 14 mm screws at C3 and C4 bilaterally 55 mm rods bilaterally Magnifuse        Complications:  * No complications entered in OR log *         Disposition: PACU - hemodynamically stable.         Condition: stable

## 2022-08-17 NOTE — Progress Notes (Signed)
   08/17/22 1115  Clinical Encounter Type  Visited With Patient;Family  Visit Type Initial;Other (Comment) (Advanced Directive)  Referral From Nurse  Consult/Referral To Chaplain   Chaplain responded to a call for execution of an advanced directive. Paperwork was notarized and and copiers are being made and returned to the patient.   Danice Goltz [facility]  [phone]

## 2022-08-17 NOTE — Anesthesia Procedure Notes (Signed)
Procedure Name: Intubation Date/Time: 08/17/2022 1:27 PM  Performed by: Kyung Rudd, CRNAPre-anesthesia Checklist: Patient identified, Emergency Drugs available, Suction available and Patient being monitored Patient Re-evaluated:Patient Re-evaluated prior to induction Oxygen Delivery Method: Circle system utilized Preoxygenation: Pre-oxygenation with 100% oxygen Induction Type: IV induction Ventilation: Mask ventilation without difficulty and Oral airway inserted - appropriate to patient size Laryngoscope Size: Glidescope and 3 Tube type: Oral Tube size: 7.0 mm Number of attempts: 1 Airway Equipment and Method: Stylet Placement Confirmation: ETT inserted through vocal cords under direct vision, positive ETCO2 and breath sounds checked- equal and bilateral Secured at: 20 cm Tube secured with: Tape Dental Injury: Teeth and Oropharynx as per pre-operative assessment  Comments: Grade I view on Glidescope screen.

## 2022-08-18 ENCOUNTER — Inpatient Hospital Stay (HOSPITAL_COMMUNITY): Payer: BC Managed Care – PPO

## 2022-08-18 ENCOUNTER — Other Ambulatory Visit: Payer: Self-pay | Admitting: Radiation Therapy

## 2022-08-18 DIAGNOSIS — C7931 Secondary malignant neoplasm of brain: Secondary | ICD-10-CM

## 2022-08-18 MED ORDER — GADOBUTROL 1 MMOL/ML IV SOLN
4.6000 mL | Freq: Once | INTRAVENOUS | Status: AC | PRN
  Administered 2022-08-18: 4.6 mL via INTRAVENOUS

## 2022-08-18 NOTE — Progress Notes (Signed)
Call to MRI to follow up on ETA for brain MRI. No answer.

## 2022-08-18 NOTE — Addendum Note (Signed)
Addended by: Pincus Large on: 08/18/2022 10:34 AM   Modules accepted: Orders

## 2022-08-18 NOTE — Evaluation (Signed)
Occupational Therapy Evaluation Patient Details Name: Martha [name] MRN: [id number] DOB: 08/24/1961 Today's Date: 08/18/2022   History of Present Illness Pt is a 61 yo female s/p Cervical One-Cervical Four POSTERIOR CERVICAL FUSION,  Cervical One LAMINECTOMY REDUCTION OF Cervical Two Fracture, Biopsy of Right Cerical Two Pars Lesion. PHMx: left and right breast cancer, ETOH and nicotine abuse.   Clinical Impression   This 61 yo female admitted and underwent above presents to acute OT with PLOF of being totally independent with all basic ADLs and IADLs. She currently is setup/S-min A for basic ADLs, and min A-minguard A for mobility. She will continue to benefit from acute OT without need for follow up.      Recommendations for follow up therapy are one component of a multi-disciplinary discharge planning process, led by the attending physician.  Recommendations may be updated based on patient status, additional functional criteria and insurance authorization.   Follow Up Recommendations  No OT follow up     Assistance Recommended at Discharge PRN  Patient can return home with the following A little help with bathing/dressing/bathroom;A little help with walking and/or transfers;Assistance with cooking/housework;Help with stairs or ramp for entrance;Assist for transportation    Functional Status Assessment  Patient has had a recent decline in their functional status and demonstrates the ability to make significant improvements in function in a reasonable and predictable amount of time.  Equipment Recommendations  None recommended by OT (did recommend she get a HHS on her own)       Precautions / Restrictions Precautions Precautions: Cervical Precaution Booklet Issued: No (have asked PT to give her one when they see her) Required Braces or Orthoses: Cervical Brace Cervical Brace: Hard collar (of for bathing) Restrictions Weight Bearing Restrictions: No      Mobility Bed  Mobility Overal bed mobility: Needs Assistance Bed Mobility: Sidelying to Sit, Sit to Sidelying   Sidelying to sit: Min guard, HOB elevated (use of rail)     Sit to sidelying: Min guard (HOB flat, use of rail) General bed mobility comments: VCs for technique    Transfers Overall transfer level: Needs assistance Equipment used: 1 person hand held assist Transfers: Sit to/from Stand, Bed to chair/wheelchair/BSC Sit to Stand: Min assist     Step pivot transfers: Min guard            Balance Overall balance assessment: Mild deficits observed, not formally tested                                         ADL either performed or assessed with clinical judgement   ADL Overall ADL's : Needs assistance/impaired Eating/Feeding: Modified independent;Sitting Eating/Feeding Details (indicate cue type and reason): c-collar Grooming: Min guard;Standing   Upper Body Bathing: Supervision/ safety;Set up;Sitting   Lower Body Bathing: Minimal assistance;Sit to/from stand   Upper Body Dressing : Set up;Supervision/safety;Sitting   Lower Body Dressing: Minimal assistance;Sit to/from stand   Toilet Transfer: Minimal assistance;Ambulation Toilet Transfer Details (indicate cue type and reason): no AD, simulated recliner<>bed Toileting- Clothing Manipulation and Hygiene: Minimal assistance;Sit to/from stand         General ADL Comments: Educated her on wear and care of c-collar. We also discovered that the fit is much better if donned in supine due to shortness of neck, educated on bringing legs up to her for LBD not bending over.     Vision  Patient Visual Report: No change from baseline              Pertinent Vitals/Pain Pain Assessment Pain Assessment: Faces Faces Pain Scale: Hurts whole lot Pain Location: in bilateral shoulders with attempts to use arms to scoot forward and stand up Pain Descriptors / Indicators: Grimacing, Guarding, Sore, Pins and  needles Pain Intervention(s): Limited activity within patient's tolerance, Monitored during session, Repositioned, Premedicated before session     Hand Dominance Right   Extremity/Trunk Assessment Upper Extremity Assessment Upper Extremity Assessment: Generalized weakness (pain with WB'ing through shoulders)           Communication Communication Communication: No difficulties   Cognition Arousal/Alertness: Awake/alert Behavior During Therapy: WFL for tasks assessed/performed Overall Cognitive Status: Within Functional Limits for tasks assessed                                                  Home Living Family/patient expects to be discharged to:: Private residence Living Arrangements: Spouse/significant other Available Help at Discharge: Family;Available 24 hours/day Type of Home: House Home Access: Stairs to enter [employer] of Steps: 2 Entrance Stairs-Rails: Right Home Layout: Two level Alternate Level Stairs-Number of Steps: 1 Alternate Level Stairs-Rails: Right (grab bar) Bathroom Shower/Tub: Tub/shower unit;Curtain   Bathroom Toilet: Standard     Home Equipment: Advice worker (2 wheels)   Additional Comments: adjustable bed      Prior Functioning/Environment Prior Level of Function : Independent/Modified Independent;Driving                        OT Problem List: Decreased strength;Decreased range of motion;Impaired balance (sitting and/or standing);Pain      OT Treatment/Interventions: Self-care/ADL training;DME and/or AE instruction;Patient/family education;Balance training    OT Goals(Current goals can be found in the care plan section) Acute Rehab OT Goals Patient Stated Goal: hopeful for home this weekend OT Goal Formulation: With patient Time For Goal Achievement: 09/01/22 Potential to Achieve Goals: Good  OT Frequency: Min 2X/week       AM-PAC OT "6 Clicks" Daily Activity     Outcome  Measure Help from another person eating meals?: None Help from another person taking care of personal grooming?: A Little Help from another person toileting, which includes using toliet, bedpan, or urinal?: A Little Help from another person bathing (including washing, rinsing, drying)?: A Little Help from another person to put on and taking off regular upper body clothing?: A Little Help from another person to put on and taking off regular lower body clothing?: A Little 6 Click Score: 19   End of Session Equipment Utilized During Treatment: Cervical collar Nurse Communication:  (chat text letting her and surgeon know about "throat spasm" pt had while I was with her.)  Activity Tolerance: Patient tolerated treatment well Patient left: in chair;with call bell/phone within reach  OT Visit Diagnosis: Unsteadiness on feet (R26.81);Other abnormalities of gait and mobility (R26.89);Pain Pain - Right/Left:  (both) Pain - part of body:  (neck and shoulders)                Time: 1024-1110 OT Time Calculation (min): 46 min Charges:  OT General Charges $OT Visit: 1 Visit OT Evaluation $OT Eval Moderate Complexity: 1 Mod OT Treatments $Self Care/Home Management : 23-37 mins  Golden Circle, OTR/L Acute Rehab Services [facility]  [place] [phone] Office [phone]    Almon Register 08/18/2022, 11:29 AM

## 2022-08-18 NOTE — Evaluation (Signed)
Physical Therapy Evaluation  Patient Details Name: Martha [name] MRN: [id number] DOB: 1961-07-15 Today's Date: 08/18/2022  History of Present Illness  Pt is a 61 yo female s/p Cervical One-Cervical Four POSTERIOR CERVICAL FUSION,  Cervical One LAMINECTOMY REDUCTION OF Cervical Two Fracture, Biopsy of Right Cerical Two Pars Lesion. PHMx: left and right breast cancer, ETOH and nicotine abuse.   Clinical Impression  Pt admitted with above diagnosis. At the time of PT eval, pt was able to demonstrate transfers and ambulation with gross min guard assist to supervision for safety and RW for support. Pt was educated on precautions, brace application/wearing schedule, appropriate activity progression, and car transfer. Pt currently with functional limitations due to the deficits listed below (see PT Problem List). Pt will benefit from skilled PT to increase their independence and safety with mobility to allow discharge to the venue listed below.     Recommendations for follow up therapy are one component of a multi-disciplinary discharge planning process, led by the attending physician.  Recommendations may be updated based on patient status, additional functional criteria and insurance authorization.  Follow Up Recommendations No PT follow up      Assistance Recommended at Discharge PRN  Patient can return home with the following  A little help with walking and/or transfers;A little help with bathing/dressing/bathroom;Assistance with cooking/housework;Assist for transportation;Help with stairs or ramp for entrance    Equipment Recommendations None recommended by PT  Recommendations for Other Services       Functional Status Assessment Patient has had a recent decline in their functional status and demonstrates the ability to make significant improvements in function in a reasonable and predictable amount of time.     Precautions / Restrictions Precautions Precautions:  Cervical;Fall Precaution Booklet Issued: Yes (comment) Precaution Comments: Reviewed handout and pt was cued for precautions during functional mobility. Required Braces or Orthoses: Cervical Brace Cervical Brace: Hard collar (of for bathing) Restrictions Weight Bearing Restrictions: No      Mobility  Bed Mobility               General bed mobility comments: Pt was received sitting up in the recliner. Pt reports being familiar with the log roll and got up well earlier with OT. Noted she was min guard assist with OT on eval.    Transfers Overall transfer level: Needs assistance Equipment used: 1 person hand held assist Transfers: Sit to/from Stand Sit to Stand: Supervision           General transfer comment: VC's for hand placement on seated surface for safety. No assist required but close supervision provided for safety.    Ambulation/Gait Ambulation/Gait assistance: Min guard Gait Distance (Feet): 400 Feet Assistive device: Rolling walker (2 wheels) Gait Pattern/deviations: Step-through pattern, Decreased stride length, Trunk flexed Gait velocity: Decreased Gait velocity interpretation: <1.31 ft/sec, indicative of household ambulator   General Gait Details: Slow and guarded. Pt ambulating well with the RW and holding the back end of the walker up at times so rubber stoppers wouldnt drag. Pt able to ambulate well with 1 railing but reports feeling more comfortable with the RW at this time.  Stairs            Wheelchair Mobility    Modified Rankin (Stroke Patients Only)       Balance Overall balance assessment: Mild deficits observed, not formally tested  Pertinent Vitals/Pain Pain Assessment Pain Assessment: Faces Faces Pain Scale: Hurts a little bit Pain Location: General grimacing during mobility/transfers. Pt reports no pain through shoulders during session but indicates her upper arms are  sore. Pain Descriptors / Indicators: Grimacing, Guarding, Sore Pain Intervention(s): Limited activity within patient's tolerance, Monitored during session, Repositioned    Home Living Family/patient expects to be discharged to:: Private residence Living Arrangements: Spouse/significant other Available Help at Discharge: Family;Available 24 hours/day Type of Home: House Home Access: Stairs to enter Entrance Stairs-Rails: Right Entrance Stairs-Number of Steps: 2 Alternate Level Stairs-Number of Steps: 1 Home Layout: Two level Home Equipment: Advice worker (2 wheels) Additional Comments: adjustable bed    Prior Function Prior Level of Function : Independent/Modified Independent;Driving                     Hand Dominance   Dominant Hand: Right    Extremity/Trunk Assessment   Upper Extremity Assessment Upper Extremity Assessment: Defer to OT evaluation    Lower Extremity Assessment Lower Extremity Assessment: Overall WFL for tasks assessed    Cervical / Trunk Assessment Cervical / Trunk Assessment: Neck Surgery  Communication   Communication: No difficulties  Cognition Arousal/Alertness: Awake/alert Behavior During Therapy: WFL for tasks assessed/performed Overall Cognitive Status: Within Functional Limits for tasks assessed                                          General Comments      Exercises     Assessment/Plan    PT Assessment Patient needs continued PT services  PT Problem List Decreased strength;Decreased range of motion;Decreased activity tolerance;Decreased balance;Decreased mobility;Decreased knowledge of use of DME;Decreased safety awareness;Decreased knowledge of precautions;Pain       PT Treatment Interventions DME instruction;Gait training;Stair training;Therapeutic activities;Functional mobility training;Therapeutic exercise;Balance training;Patient/family education    PT Goals (Current goals can be found in  the Care Plan section)  Acute Rehab PT Goals Patient Stated Goal: Return home with family support tomorrow (saturday) PT Goal Formulation: With patient/family Time For Goal Achievement: 08/25/22 Potential to Achieve Goals: Good    Frequency Min 5X/week     Co-evaluation               AM-PAC PT "6 Clicks" Mobility  Outcome Measure Help needed turning from your back to your side while in a flat bed without using bedrails?: A Little Help needed moving from lying on your back to sitting on the side of a flat bed without using bedrails?: A Little Help needed moving to and from a bed to a chair (including a wheelchair)?: A Little Help needed standing up from a chair using your arms (e.g., wheelchair or bedside chair)?: A Little Help needed to walk in hospital room?: A Little Help needed climbing 3-5 steps with a railing? : A Little 6 Click Score: 18    End of Session Equipment Utilized During Treatment: Gait belt;Cervical collar Activity Tolerance: Patient tolerated treatment well Patient left: in chair;with call bell/phone within reach;with family/visitor present Nurse Communication: Mobility status PT Visit Diagnosis: Unsteadiness on feet (R26.81);Pain Pain - Right/Left:  (bilateral) Pain - part of body: Arm (neck)    Time: 8182-9937 PT Time Calculation (min) (ACUTE ONLY): 31 min   Charges:   PT Evaluation $PT Eval Low Complexity: 1 Low PT Treatments $Gait Training: 8-22 mins  Rolinda Roan, PT, DPT Acute Rehabilitation Services Secure Chat Preferred Office: [phone]   Thelma Comp 08/18/2022, 1:24 PM

## 2022-08-18 NOTE — Progress Notes (Signed)
Subjective: Patient reports pain is well controlled.  No new neurologic symptoms  Objective: Vital signs in last 24 hours: Temp:  [97.9 F (36.6 C)-99.7 F (37.6 C)] 98.5 F (36.9 C) (12/22 0745) Pulse Rate:  [73-101] 82 (12/22 0745) Resp:  [12-29] 16 (12/22 0745) BP: (114-171)/(74-105) 148/82 (12/22 0745) SpO2:  [90 %-100 %] 100 % (12/22 0745)  Intake/Output from previous day: 12/21 0701 - 12/22 0700 In: 2226.4 [P.O.:480; I.V.:1549.6; IV Piggyback:196.8] Out: 915 [Urine:850; Drains:65] Intake/Output this shift: Total I/O In: 389.1 [P.O.:360; I.V.:29.1] Out: 1840 [Urine:1800; Drains:40]  NAD Dressing reinforced JP in place Full strength in Ues and Les  Lab Results: No results for input(s): "WBC", "HGB", "HCT", "PLT" in the last 72 hours. BMET No results for input(s): "NA", "K", "CL", "CO2", "GLUCOSE", "BUN", "CREATININE", "CALCIUM" in the last 72 hours.  Studies/Results: DG Cervical Spine 2 or 3 views  Result Date: 08/17/2022 CLINICAL DATA:  Fluoro guidance provided EXAM: CERVICAL SPINE - 2-3 VIEW FINDINGS: Dose: 3.2 mGy Fluoro time 63s IMPRESSION: C-arm fluoro guidance provided. Electronically Signed   By: Sammie Bench M.D.   On: 08/17/2022 18:01   DG C-Arm 1-60 Min-No Report  Result Date: 08/17/2022 Fluoroscopy was utilized by the requesting physician.  No radiographic interpretation.   DG C-Arm 1-60 Min-No Report  Result Date: 08/17/2022 Fluoroscopy was utilized by the requesting physician.  No radiographic interpretation.   DG C-Arm 1-60 Min-No Report  Result Date: 08/17/2022 Fluoroscopy was utilized by the requesting physician.  No radiographic interpretation.    Assessment/Plan: C2 pathologic fracture s/p C1-4 posterior fusion - mobilize with PT/OT - lovenox - will obtain MRI brain with/without to assess CNS disease burden - likely dc drain and dc home tomorrow   Martha [name] 08/18/2022, 12:30 PM

## 2022-08-19 MED ORDER — OXYCODONE-ACETAMINOPHEN 5-325 MG PO TABS: 1.0000 | ORAL_TABLET | ORAL | 0 refills | Status: DC | PRN

## 2022-08-19 MED ORDER — CYCLOBENZAPRINE HCL 10 MG PO TABS: 10.0000 mg | ORAL_TABLET | Freq: Three times a day (TID) | ORAL | 0 refills | Status: DC | PRN

## 2022-08-19 MED ORDER — POLYETHYLENE GLYCOL 3350 17 G PO PACK: 17.0000 g | PACK | Freq: Every day | ORAL | 0 refills | Status: DC | PRN

## 2022-08-19 NOTE — Plan of Care (Signed)
  Problem: Education: Goal: Knowledge of the prescribed therapeutic regimen will improve Outcome: Progressing   Problem: Activity: Goal: Will remain free from falls Outcome: Progressing   Problem: Clinical Measurements: Goal: Postoperative complications will be avoided or minimized Outcome: Progressing   Problem: Pain Management: Goal: Pain level will decrease Outcome: Progressing   Problem: Activity: Goal: Risk for activity intolerance will decrease Outcome: Progressing   Problem: Nutrition: Goal: Adequate nutrition will be maintained Outcome: Progressing

## 2022-08-19 NOTE — Discharge Instructions (Signed)
Wound Care Keep incision covered and dry for two days.    Do not put any creams, lotions, or ointments on incision. Leave steri-strips on back.  They will fall off by themselves. You are fine to shower. Let water run over incision and pat dry.  Activity Walk each and every day, increasing distance each day. No lifting greater than 5 lbs.  Avoid excessive neck motion. No driving for 2 weeks; may ride as a passenger locally.  Diet Resume your normal diet.   Return to Work Will be discussed at your follow up appointment.  Call Your Doctor If Any of These Occur Redness, drainage, or swelling at the wound.  Temperature greater than 101 degrees. Severe pain not relieved by pain medication. Incision starts to come apart.  Follow Up Appt Call [phone] today for appointment in 2 weeks if you don't already have one or for any problems.  If you have any hardware placed in your spine, you will need an x-ray before your appointment.

## 2022-08-19 NOTE — Discharge Summary (Signed)
Physician Discharge Summary     Providing Compassionate, Quality Care - Together   Patient ID: Martha [name] MRN: [id number] DOB/AGE: August 13, 1961 61 y.o.  Admit date: 08/17/2022 Discharge date: 08/19/2022  Admission Diagnoses: C2 cervical fracture  Discharge Diagnoses:  Principal Problem:   C2 cervical fracture [facility]) Active Problems:   Metastatic adenocarcinoma [facility])   Discharged Condition: good  Hospital Course: Patient underwent a C1-C4 posterior cervical fusion by Dr. Marcello Moores on 08/17/2022. She was admitted to 6N01 following recovery from anesthesia in the PACU. Her postoperative course has been uncomplicated. She has worked with both physical and occupational therapies who feel the patient is ready for discharge home. She is ambulating independently and without difficulty. She is tolerating a normal diet. She is not having any bowel or bladder dysfunction. Her pain is reasonably controlled with oral pain medication. She is ready for discharge home.   Consults: PT/OT  Significant Diagnostic Studies: radiology: MR BRAIN W WO CONTRAST  Result Date: 08/18/2022 CLINICAL DATA:  61 year old female with history of metastatic breast cancer. Evaluate CNS disease burden. EXAM: MRI HEAD WITHOUT AND WITH CONTRAST TECHNIQUE: Multiplanar, multiecho pulse sequences of the brain and surrounding structures were obtained without and with intravenous contrast. A limited stealth protocol MRI was performed. CONTRAST:  4.4m GADAVIST GADOBUTROL 1 MMOL/ML IV SOLN COMPARISON:  Comparison made with prior MRI of the cervical spine from 07/24/2022. FINDINGS: Brain: Cerebral volume within normal limits. No significant cerebral white matter disease for age. Multiple scattered intraparenchymal metastatic deposits are seen involving both cerebral hemispheres as well as the cerebellum. In total, there are at least 10 lesions. For reference purposes, the largest supratentorial lesion is seen at the left  occipital pole and measures 5 mm (series 6, image 68). The largest infratentorial lesion involves the flocculus of the left cerebellum and measures 1.1 cm (series 6, image 35). No more than minimal surrounding edema about a few of these lesions on FLAIR sequence, but no significant regional mass effect or midline shift. No other definite acute intracranial abnormality evident on this limited stealth protocol MRI. No hydrocephalus or extra-axial fluid collection. No other abnormal enhancement. Vascular: Major intracranial vascular flow voids are grossly maintained. Skull and upper cervical spine: Widespread osseous metastases seen throughout the visualized upper cervical spine in about the calvarium. Associated pathologic fracture at C2 with sequelae of prior posterior fusion at C2 through C5 noted. No scalp soft tissue abnormality. Sinuses/Orbits: Globes orbital soft tissues demonstrate no acute finding. Paranasal sinuses are largely clear. No mastoid effusion. Other: None. IMPRESSION: 1. Multiple scattered intraparenchymal metastatic deposits involving both cerebral hemispheres as well as the cerebellum. No more than minimal surrounding edema about a few of these lesions, without significant regional mass effect or midline shift. 2. Widespread osseous metastatic disease throughout the visualized upper cervical spine and calvarium. Associated pathologic fracture of C2 with sequelae of prior posterior fusion. Electronically Signed   By: BJeannine BogaM.D.   On: 08/18/2022 23:41   DG Cervical Spine 2 or 3 views  Result Date: 08/17/2022 CLINICAL DATA:  Fluoro guidance provided EXAM: CERVICAL SPINE - 2-3 VIEW FINDINGS: Dose: 3.2 mGy Fluoro time 63s IMPRESSION: C-arm fluoro guidance provided. Electronically Signed   By: JSammie BenchM.D.   On: 08/17/2022 18:01   DG C-Arm 1-60 Min-No Report  Result Date: 08/17/2022 Fluoroscopy was utilized by the requesting physician.  No radiographic interpretation.    DG C-Arm 1-60 Min-No Report  Result Date: 08/17/2022 Fluoroscopy was utilized by the requesting physician.  No radiographic interpretation.   DG C-Arm 1-60 Min-No Report  Result Date: 08/17/2022 Fluoroscopy was utilized by the requesting physician.  No radiographic interpretation.     Treatments: surgery:   C1-2, C2-3, C3-4 posterior arthrodesis Open reduction of C2 pathologic fracture Segmental instrumentation C1-C2-C3-C4 C1 laminectomy Biopsy of right C2 pars mass Use of morselized allograft  Discharge Exam: Blood pressure (!) 148/63, pulse 84, temperature 98.3 F (36.8 C), temperature source Oral, resp. rate 16, height '5\' 3"'$  (1.6 m), weight 46.3 kg, SpO2 94 %.  Alert and oriented x 4 PERRLA CN II-XII grossly intact MAE, Strength and sensation intact Incision is covered with Honeycomb dressing Dressing is clean, dry, and intact JP drain removed   Disposition: Discharge disposition: 01-Home or Self Care       Discharge Instructions     Call MD for:  difficulty breathing, headache or visual disturbances   Complete by: As directed    Call MD for:  hives   Complete by: As directed    Call MD for:  persistant nausea and vomiting   Complete by: As directed    Call MD for:  redness, tenderness, or signs of infection (pain, swelling, redness, odor or green/yellow discharge around incision site)   Complete by: As directed    Call MD for:  severe uncontrolled pain   Complete by: As directed    Diet - low sodium heart healthy   Complete by: As directed    Incentive spirometry RT   Complete by: As directed    Increase activity slowly   Complete by: As directed    No wound care   Complete by: As directed    Remove dressing in 48 hours   Complete by: As directed       Allergies as of 08/19/2022       Reactions   Morphine And Related    Makes her angry        Medication List     STOP taking these medications    traMADol 50 MG tablet Commonly known  as: ULTRAM       TAKE these medications    citalopram 20 MG tablet Commonly known as: CELEXA Take 1 tablet (20 mg total) by mouth daily.   cyclobenzaprine 10 MG tablet Commonly known as: FLEXERIL Take 1 tablet (10 mg total) by mouth 3 (three) times daily as needed for muscle spasms.   letrozole 2.5 MG tablet Commonly known as: FEMARA TAKE 1 TABLET BY MOUTH EVERY DAY   levothyroxine 75 MCG tablet Commonly known as: SYNTHROID Take 1 tablet (75 mcg total) by mouth daily before breakfast.   meloxicam 15 MG tablet Commonly known as: MOBIC Take 1 tablet (15 mg total) by mouth daily.   OVER THE COUNTER MEDICATION Take 1 tablet by mouth daily. Protandim Supplement   oxyCODONE-acetaminophen 5-325 MG tablet Commonly known as: Percocet Take 1-2 tablets by mouth every 4 (four) hours as needed for severe pain (postoperative pain).   polyethylene glycol 17 g packet Commonly known as: MIRALAX / GLYCOLAX Take 17 g by mouth daily as needed for mild constipation.   PROBIOTIC PO Take 1 capsule by mouth daily.        Follow-up Information     Vallarie Mare, MD. Go on 09/04/2022.   Specialty: Neurosurgery Why: First post op appointment with x-rays is on 09/04/2022 at 2:00 PM. Contact information: [address] [place] [address] [phone]  Signed: Viona Gilmore, DNP, AGNP-C Nurse Practitioner  [facility] [address] [place], [place], [place] [address] P: [phone]    F: [phone]  08/19/2022, 11:32 AM

## 2022-08-19 NOTE — Progress Notes (Signed)
Physical Therapy Treatment Patient Details Name: Martha [name] MRN: [id number] DOB: 11-28-1960 Today's Date: 08/19/2022   History of Present Illness Pt is a 61 yo female s/p Cervical One-Cervical Four POSTERIOR CERVICAL FUSION,  Cervical One LAMINECTOMY REDUCTION OF Cervical Two Fracture, Biopsy of Right Cerical Two Pars Lesion. PHMx: left and right breast cancer, ETOH and nicotine abuse.    PT Comments    Pt greeted propped in bed and agreeable to session with continued progress towards acute goals. Pt demonstrating good adherence to precautions throughout session. Pt grossly min guard assist down to supervision during all mobility. Pt continues to demonstrate guarded movements throughout session, with pt demonstrating most difficulty with bed mobility needing increased time to complete and use of bed functions, however no physical assist needed. Pt able to ascend/descend 4 steps in stairwell without fault. Anticipate safe discharge once medically cleared, will follow acutely.    Recommendations for follow up therapy are one component of a multi-disciplinary discharge planning process, led by the attending physician.  Recommendations may be updated based on patient status, additional functional criteria and insurance authorization.  Follow Up Recommendations  No PT follow up     Assistance Recommended at Discharge PRN  Patient can return home with the following A little help with walking and/or transfers;A little help with bathing/dressing/bathroom;Assistance with cooking/housework;Assist for transportation;Help with stairs or ramp for entrance   Equipment Recommendations  None recommended by PT    Recommendations for Other Services       Precautions / Restrictions Precautions Precautions: Cervical;Fall Precaution Booklet Issued: Yes (comment) Precaution Comments: Reviewed handout and pt was cued for precautions during functional mobility. Required Braces or Orthoses:  Cervical Brace Cervical Brace: Hard collar (of for bathing) Restrictions Weight Bearing Restrictions: No     Mobility  Bed Mobility Overal bed mobility: Needs Assistance Bed Mobility: Rolling, Sidelying to Sit, Sit to Sidelying Rolling: Min guard Sidelying to sit: Min guard, HOB elevated     Sit to sidelying: Min guard General bed mobility comments: min guard, HOB elevated + rail use and increased time needed    Transfers Overall transfer level: Needs assistance Equipment used: Rolling walker (2 wheels) Transfers: Sit to/from Stand Sit to Stand: Supervision           General transfer comment: VC's for hand placement on seated surface for safety. No assist required but close supervision provided for safety.    Ambulation/Gait Ambulation/Gait assistance: Min guard, Supervision Gait Distance (Feet): 400 Feet Assistive device: Rolling walker (2 wheels) Gait Pattern/deviations: Step-through pattern, Decreased stride length, Trunk flexed Gait velocity: Decreased     General Gait Details: Slow and guarded. Pt ambulating well with the RW and holding the back end of the walker up at times so rubber stoppers wouldnt drag. continued to endorse increased feelings of security with RW use   Stairs Stairs: Yes Stairs assistance: Min guard Stair Management: One rail Right, Alternating pattern, Forwards Number of Stairs: 4 General stair comments: up/down steps in stairwell with step-over step pattern, educated pt on step to pattern for descent for increased safety   Wheelchair Mobility    Modified Rankin (Stroke Patients Only)       Balance Overall balance assessment: Mild deficits observed, not formally tested                                          Cognition Arousal/Alertness:  Awake/alert Behavior During Therapy: WFL for tasks assessed/performed Overall Cognitive Status: Within Functional Limits for tasks assessed                                           Exercises      General Comments        Pertinent Vitals/Pain Pain Assessment Pain Assessment: Faces Faces Pain Scale: Hurts little more Pain Location: General grimacing during mobility/transfers. pain in her L axilla, upper arms Pain Descriptors / Indicators: Grimacing, Guarding, Sore, Tingling Pain Intervention(s): Monitored during session, Limited activity within patient's tolerance, Repositioned    Home Living                          Prior Function            PT Goals (current goals can now be found in the care plan section) Acute Rehab PT Goals PT Goal Formulation: With patient/family Time For Goal Achievement: 08/25/22 Progress towards PT goals: Progressing toward goals    Frequency    Min 5X/week      PT Plan      Co-evaluation              AM-PAC PT "6 Clicks" Mobility   Outcome Measure  Help needed turning from your back to your side while in a flat bed without using bedrails?: A Little Help needed moving from lying on your back to sitting on the side of a flat bed without using bedrails?: A Little Help needed moving to and from a bed to a chair (including a wheelchair)?: A Little Help needed standing up from a chair using your arms (e.g., wheelchair or bedside chair)?: A Little Help needed to walk in hospital room?: A Little Help needed climbing 3-5 steps with a railing? : A Little 6 Click Score: 18    End of Session Equipment Utilized During Treatment: Gait belt;Cervical collar Activity Tolerance: Patient tolerated treatment well Patient left: with call bell/phone within reach;with family/visitor present;in bed Nurse Communication: Mobility status PT Visit Diagnosis: Unsteadiness on feet (R26.81);Pain Pain - Right/Left:  (bilateral) Pain - part of body: Arm (neck)     Time: 6759-1638 PT Time Calculation (min) (ACUTE ONLY): 16 min  Charges:  $Gait Training: 8-22 mins                     Jagar Lua R.  PTA Acute Rehabilitation Services Office: [place] 08/19/2022, 9:55 AM

## 2022-08-21 ENCOUNTER — Other Ambulatory Visit: Payer: Self-pay | Admitting: Sports Medicine

## 2022-08-22 ENCOUNTER — Encounter (HOSPITAL_COMMUNITY): Payer: Self-pay | Admitting: Neurosurgery

## 2022-08-22 ENCOUNTER — Other Ambulatory Visit: Payer: Self-pay | Admitting: Sports Medicine

## 2022-08-22 ENCOUNTER — Telehealth: Payer: Self-pay

## 2022-08-22 LAB — BPAM RBC
Blood Product Expiration Date: 202312272359
Blood Product Expiration Date: 202312292359
ISSUE DATE / TIME: 202312211423
ISSUE DATE / TIME: 202312211423
Unit Type and Rh: 9500
Unit Type and Rh: 9500

## 2022-08-22 LAB — TYPE AND SCREEN
ABO/RH(D): O NEG
Antibody Screen: NEGATIVE
Unit division: 0
Unit division: 0

## 2022-08-22 NOTE — Progress Notes (Addendum)
Location/Histology of Brain Tumor:  Secondary malignant neoplasm of brain and spinal cord   Patient presented with symptoms of:   This is a 61 year old woman with a history of breast cancer who developed occipital neuralgia and subsequently progressive mechanical neck pain.  She was found to have a pathologic fracture of C2 with instability.  She had diffuse osseous disease evident on PET scan as well as brain metastases on MRI.  She had excellent performance score, though she had noticed significant weight loss over the past several months.  I had a long discussion with the patient.  Given her diffuse spine involvement, including early body fractures of C5 and C6, reconstruction would be difficult.  However, with the diffuse nature of her disease, it was clear that the smallest possible stabilization surgery would be the best option for her.  This would involve C1-C4 posterior fusion, with possible extension to the occiput if inadequate purchase of the C1 lateral masses, and possible extension to C7 if inadequate lateral mass purchase.  I discussed the general technique of surgery, as well as risk, benefits, alternatives, expected convalescence.  Risk discussed included, but were not limited to, bleeding, pain, infection, scar, pseudoarthrosis, instability, stroke, neurologic deficit, spinal fluid leak, and death.  Informed consent was obtained.  She wished to proceed with surgery.   Past or anticipated interventions, if any, per neurosurgery:  Vallarie Mare, MD  Physician Neurosurgery   Op Note    Addendum   Date of Service: 08/17/2022  4:42 PM    Procedure(s): Cervical One-Cervical Four POSTERIOR CERVICAL FUSION,  Cervical One LAMINECTOMY REDUCTION OF Cervical Two Fracture, Biopsy of Right Cervical Two Pars  Lesion Procedure Note         Past or anticipated interventions, if any, per medical oncology:  Dr. Burr Medico 08-04-22 ASSESSMENT:  Martha [name] is a 61 y.o. female with     Cancer, metastatic to bone [facility]) -presented with neck pain in July 2023 -MRI cervical spine on 07/24/2022 showed Extensive osseous metastatic disease with pathologic fracture at the base of dens and C2 right lateral mass. Extraosseous tumor at C1 and C2 likely impinging on the right C2 and C3 nerve roots. -PET scan from 08/03/2022 showed diffuse bone mets  -I recommend bone biopsy to confirm metastasis and repeat ER/PR/HER2 -I recommended Zometa infusion every 3 months to stress to her bone.  Benefit and side effect discussed with her, she agrees   Malignant neoplasm of lower-inner quadrant of left breast in female, estrogen receptor positive ([place]) --She was diagnosed in 12/2017. She is s/p left breast lumpectomy and adjuvant radiation.  -She started anti-estrogen therapy with letrozole on 05/2018. Tolerating well with no issues -lost f/u after visit in 04/2020 -she likely has bone mets from her previous breast cancer.  I personally reviewed her PET scan image and discussed the findings with her -I will reach out to her neurosurgeon Dr. Marcello Moores to see if any cervical spine surgery is planned, and if biopsy during surgery is feasible.  If not, I will refer to IR follow-up iliac bone biopsy. -If her biopsy confirms metastatic breast cancer, ER positive and HER2 negative, I plan to change her treatment to fulvestrant injections and Verzenio.  I discussed the benefit and potential side effect, she is interested.   Social and financial stress -She is currently unemployed, does have insurance from her husband, she is quite distressed by the financial burden from her cancer treatment. -I referred her to our research team, she will  be screened for the financial burden study. -Social worker referral, for counseling, and guidance on social benefit application.    Goal of care discussion  -I discussed the incurable nature of her cancer, and the overall poor prognosis, especially if he/she does not have good  response to cancer treatment  -The patient understands the goal of care is palliative. -she is full code now  -Will refer to Education officer, museum for advanced directives    PLAN: - Discuss PET Scan -lab Reviewed, I called in [place] for her UTI. -Discuss a tissue biopsy, either by Dr. Marcello Moores during her cervical spine surgery, or IR -Referral to SW -Discuss changing from Letrozole to an injection fulvestrant and Verzenio (after surgery if she will have surgery) -She will proceed with palliative radiation to cervical spine lesion in the near future -Call in Tramadol -f/u in 2-4 weeks, depends on her radiation and surgery plans     Dose of Decadron, if applicable: not applicable  Recent neurologic symptoms, if any:  Seizures: none Headaches: no headaches Nausea: no Dizziness/ataxia: yes, mild with medicine Difficulty with hand coordination: no Focal numbness/weakness: overall weakness, better with meds-may need physical therapy Visual deficits/changes: no changes Confusion/Memory deficits: no concerns  Painful bone metastases at present, if any: none  SAFETY ISSUES: Prior radiation? Yes, four years ago Pacemaker/ICD? no Possible current pregnancy? no Is the patient on methotrexate? no  Additional Complaints / other details: wants to know details of cancer and study results, losing weight, hard to eat with neck collar, feels like throat closes off with eating  and lying down. Swallowing concerns.   Vitals:   08/30/22 0911  BP: (!) 145/71  Pulse: (!) 113  Resp: 20  Temp: 97.7 F (36.5 C)  SpO2: 100%

## 2022-08-22 NOTE — Patient Outreach (Signed)
  Care Coordination TOC Note Transition Care Management Follow-up Telephone Call Date of discharge and from where: 08/19/22-[place] Dx: "C2 cervical fracture" How have you been since you were released from the hospital? Voicemail message received from patient. Return call placed to patient. She is pleased to report how well she is doing. For the past two days she has only taken Tylenol prn during the day if needed. She has been up moving around. Spouse is helping her. He voiced that patient's incision is "clean and looks good."Appetite is fair. Patient reports having a BM daily. Any questions or concerns? No  Items Reviewed: Did the pt receive and understand the discharge instructions provided? Yes  Medications obtained and verified? Yes  Other? Yes  Any new allergies since your discharge? No  Dietary orders reviewed? Yes Do you have support at home? Yes   Home Care and Equipment/Supplies: Were home health services ordered? not applicable If so, what is the name of the agency? N/A  Has the agency set up a time to come to the patient's home? not applicable Were any new equipment or medical supplies ordered?  No What is the name of the medical supply agency? N/A Were you able to get the supplies/equipment? not applicable Do you have any questions related to the use of the equipment or supplies? No  Functional Questionnaire: (I = Independent and D = Dependent) ADLs: A  Bathing/Dressing- A  Meal Prep- A  Eating- I  Maintaining continence- I  Transferring/Ambulation- I  Managing Meds- I  Follow up appointments reviewed:  PCP Hospital f/u appt confirmed? No  [facility] f[facility]u [facility]t confirmed? Yes  Scheduled to see Dr.Thomas on 09/04/22 @ 2pm. Are transportation arrangements needed? No  If their condition worsens, is the pt aware to call PCP or go to the Emergency Dept.? Yes Was the patient provided with contact information for the PCP's office or ED? Yes Was to pt  encouraged to call back with questions or concerns? Yes  SDOH assessments and interventions completed:   Yes SDOH Interventions Today    Flowsheet Row Most Recent Value  SDOH Interventions   Food Insecurity Interventions Intervention Not Indicated  Transportation Interventions Intervention Not Indicated       Care Coordination Interventions:  Education provided    Encounter Outcome:  Pt. Visit Completed    Enzo Montgomery, RN,BSN,CCM [facility] Telephonic Care Management Coordinator Direct Phone: [phone] Toll Free: [phone] Fax: [phone]

## 2022-08-22 NOTE — Patient Outreach (Signed)
  Care Coordination TOC Note Transition Care Management Unsuccessful Follow-up Telephone Call  Date of discharge and from where:  08/19/22-[place]  Attempts:  1st Attempt  Reason for unsuccessful TCM follow-up call:  Left voice message     [facility] [place] [facility] Telephonic Care Management Coordinator Direct Phone: [phone] Toll Free: [phone] Fax: [phone]

## 2022-08-24 ENCOUNTER — Other Ambulatory Visit: Payer: Self-pay

## 2022-08-25 ENCOUNTER — Ambulatory Visit (HOSPITAL_COMMUNITY)
Admission: RE | Admit: 2022-08-25 | Discharge: 2022-08-25 | Disposition: A | Discharge status: Home / Self Care | Payer: BC Managed Care – PPO | Source: Ambulatory Visit | Attending: Radiation Oncology | Admitting: Radiation Oncology

## 2022-08-25 DIAGNOSIS — C7931 Secondary malignant neoplasm of brain: Secondary | ICD-10-CM | POA: Insufficient documentation

## 2022-08-25 DIAGNOSIS — G319 Degenerative disease of nervous system, unspecified: Secondary | ICD-10-CM | POA: Diagnosis not present

## 2022-08-25 DIAGNOSIS — C7949 Secondary malignant neoplasm of other parts of nervous system: Secondary | ICD-10-CM | POA: Insufficient documentation

## 2022-08-25 DIAGNOSIS — G9389 Other specified disorders of brain: Secondary | ICD-10-CM | POA: Diagnosis not present

## 2022-08-25 MED ORDER — GADOBUTROL 1 MMOL/ML IV SOLN
4.0000 mL | Freq: Once | INTRAVENOUS | Status: AC | PRN
  Administered 2022-08-25: 4 mL via INTRAVENOUS

## 2022-08-26 ENCOUNTER — Encounter (HOSPITAL_COMMUNITY): Payer: Self-pay

## 2022-08-26 ENCOUNTER — Other Ambulatory Visit: Payer: Self-pay

## 2022-08-26 ENCOUNTER — Emergency Department (HOSPITAL_COMMUNITY)
Admission: EM | Admit: 2022-08-26 | Discharge: 2022-08-26 | Disposition: A | Discharge status: Home / Self Care | Payer: BC Managed Care – PPO | Attending: Emergency Medicine | Admitting: Emergency Medicine

## 2022-08-26 DIAGNOSIS — M542 Cervicalgia: Secondary | ICD-10-CM | POA: Diagnosis not present

## 2022-08-26 DIAGNOSIS — C50919 Malignant neoplasm of unspecified site of unspecified female breast: Secondary | ICD-10-CM | POA: Diagnosis not present

## 2022-08-26 DIAGNOSIS — M4802 Spinal stenosis, cervical region: Secondary | ICD-10-CM | POA: Diagnosis not present

## 2022-08-26 DIAGNOSIS — M8458XA Pathological fracture in neoplastic disease, other specified site, initial encounter for fracture: Secondary | ICD-10-CM | POA: Diagnosis not present

## 2022-08-26 DIAGNOSIS — G8918 Other acute postprocedural pain: Secondary | ICD-10-CM | POA: Insufficient documentation

## 2022-08-26 DIAGNOSIS — R200 Anesthesia of skin: Secondary | ICD-10-CM | POA: Insufficient documentation

## 2022-08-26 DIAGNOSIS — M79621 Pain in right upper arm: Secondary | ICD-10-CM | POA: Insufficient documentation

## 2022-08-26 DIAGNOSIS — M79622 Pain in left upper arm: Secondary | ICD-10-CM | POA: Diagnosis not present

## 2022-08-26 DIAGNOSIS — C7951 Secondary malignant neoplasm of bone: Secondary | ICD-10-CM | POA: Diagnosis not present

## 2022-08-26 MED ORDER — GABAPENTIN 100 MG PO CAPS: 100.0000 mg | ORAL_CAPSULE | Freq: Three times a day (TID) | ORAL | 0 refills | Status: DC

## 2022-08-26 MED ORDER — PREDNISONE 10 MG (21) PO TBPK: ORAL_TABLET | Freq: Every day | ORAL | 0 refills | Status: DC

## 2022-08-26 NOTE — ED Triage Notes (Addendum)
Pt BIB [facility] from home after surgery last week for C1-c6 r/t bone cancer mets. Patient reports bilateral bicep muscle spasms with shooting pain down her arms starting yesterday. Patient also reports decreased PO intake d/t c-collar and sleepiness. Patient also took 5-325 percocet and '10mg'$  flexeril at 1205.

## 2022-08-26 NOTE — Consult Note (Signed)
Neurosurgery Consultation  Reason for Consult: BUE pain Referring Physician: Francia Greaves  CC: BUE tingling / pain  HPI: This is a 61 y.o. woman that presents with BUE radicular symptoms into the hands diffusely. It's intermittent, it's uncomfortable but she came in due to concern regarding her recent neck surgery as well as the pain. No issues with the wound, no myelopathy Sx, o new weakness, numbness, or parasthesias, no recent change in bowel or bladder function.    ROS: A 14 point ROS was performed and is negative except as noted in the HPI.   PMHx:  Past Medical History:  Diagnosis Date   Anxiety    Cancer ([place]) 2022   right breast LCIS   Cancer ([place]) 2019   left breast   Depression    Dysrhythmia    SVT, s/p ablation ~ 2012 in at [facility]   Ectopic pregnancy    Family history of breast cancer    Family history of melanoma    History of radiation therapy 04/22/18-06/03/18   Left Breast, left SCV, axilla 50 Gy in 25 fractions, Left breast boost 10 Gy in 5 fractions.    Hypothyroidism    Personal history of radiation therapy    PONV (postoperative nausea and vomiting)    Thyroid disease    FamHx:  Family History  Problem Relation Age of Onset   Heart disease Mother    Pneumonia Father    Hyperlipidemia Sister    Melanoma Sister 67       hx of 5 melanomas   Lung cancer Maternal Uncle        mesothelioma   Pneumonia Maternal Uncle    Dementia Paternal Grandmother    Lung disease Paternal Grandfather    Breast cancer Other        MGMs sister dx < 58   Breast cancer Other        PGFs sister dx < 56   Breast cancer Cousin        mother's mat first cousin, dx < 50   SocHx:  reports that she has been smoking cigarettes. She has a 40.00 pack-year smoking history. She has never used smokeless tobacco. She reports current alcohol use of about 28.0 standard drinks of alcohol per week. She reports that she does not use drugs.  Exam: Vital signs in last 24  hours: Temp:  [98.9 F (37.2 C)] 98.9 F (37.2 C) (12/30 1235) Pulse Rate:  [81-90] 85 (12/30 1345) Resp:  [12-22] 12 (12/30 1345) BP: (150-176)/(88-91) 150/88 (12/30 1345) SpO2:  [94 %-99 %] 94 % (12/30 1345) General: Awake, alert, cooperative, lying in bed in NAD Head: Normocephalic, cervical collar in place HEENT: Neck supple, incision c/d/I and healing well Pulmonary: breathing room air comfortably, no evidence of increased work of breathing Cardiac: RRR Abdomen: S NT ND Extremities: Warm and well perfused x4 Neuro: AOx3, PERRL, EOMI, FS Strength 5/5 x4, SILTx4, no hoffman's, no clonus   Assessment and Plan: 61 y.o. woman s/p C1-4 PSIF for pathologic C2 frx 2/2 breast Ca. Discussed w/ her, most likely symptomatic from her foraminal stenosis caudal to her surgery, neurologic exam is intact.  -medrol dose pak, start gabapentin 100tid, first dose qhs, discussed this with her, encouraged her to call if the gabapentin is too sedating or doesn't improve the symptoms, will be seen early next week for SRS mask fitting, can re-eval then if necessary -please call with any concerns or questions  Judith Part, MD 08/26/22 2:34  PM [facility]

## 2022-08-26 NOTE — Discharge Instructions (Signed)
At this time there does not appear to be the presence of an emergent medical condition, however there is always the potential for conditions to change. Please read and follow the below instructions.  Please return to the Emergency Department immediately for any new or worsening symptoms. Please be sure to follow up with your Primary Care Provider within one week regarding your visit today; please call their office to schedule an appointment even if you are feeling better for a follow-up visit. You have been prescribed prednisone to help with your pain.  This is a steroid medication.  Prednisone may cause you difficulty sleeping so you may try to take it earlier in the day.  Prednisone may also cause increased appetite and weight gain.  Prednisone may cause increased irritability.  If you develop any severe side effects discontinue medication please call your neurosurgeon for further recommendations. You have been prescribed gabapentin this is a medication to help with your symptoms.  Gabapentin may make you drowsy so do not drive a car, drink alcohol or perform any potentially dangerous activities after taking it.   Please read the additional information packets attached to your discharge summary.  Go to the nearest Emergency Department immediately if: You have fever or chills Your pain gets worse and medicine does not help. You lose feeling or feel weak in your hand, arm, face, or leg. You have a high fever. Your neck is stiff. You cannot control when you poop or pee (have incontinence). You have trouble with walking, balance, or talking. You have any new/concerning or worsening of symptoms.  Do not take your medicine if  develop an itchy rash, swelling in your mouth or lips, or difficulty breathing; call 911 and seek immediate emergency medical attention if this occurs.  You may review your lab tests and imaging results in their entirety on your MyChart account.  Please discuss all results of  fully with your primary care provider and other specialist at your follow-up visit.  Note: Portions of this text may have been transcribed using voice recognition software. Every effort was made to ensure accuracy; however, inadvertent computerized transcription errors may still be present.

## 2022-08-26 NOTE — ED Provider Notes (Signed)
[facility] EMERGENCY DEPARTMENT Provider Note   CSN: [id number] Arrival date & time: 08/26/22  1225     History  Chief Complaint  Patient presents with   Post-op Problem    Martha [name] is a 61 y.o. female presenting for evaluation of arm pain.  Patient reports that 1 week ago she had cervical fusion, she reports that she noticed the pain over the past 2 days she reports pain is in the bilateral upper arms and radiates down to her fingers and associated with occasional numbness.  She does report occasionally skipping doses of her pain medications.  She describes her pain as a spasming pain which is moderate in intensity improves somewhat with Percocet.  She denies any fall, injury or weakness.  She denies fever, chills, saddle paresthesias, bowel/bladder incontinence, urinary retention or any additional concerns.  Patient does report some dry mouth since she was admitted to the hospital after surgery.  She denies decreased p.o. intake abdominal pain nausea vomiting or any additional concerns. HPI     Home Medications Prior to Admission medications   Medication Sig Start Date End Date Taking? Authorizing Provider  gabapentin (NEURONTIN) 100 MG capsule Take 1 capsule (100 mg total) by mouth 3 (three) times daily for 15 days. 08/26/22 09/10/22 Yes Nuala Alpha A, PA-C  predniSONE (STERAPRED UNI-PAK 21 TAB) 10 MG (21) TBPK tablet Take by mouth daily. Take 6 tabs by mouth daily  for 2 days, then 5 tabs for 2 days, then 4 tabs for 2 days, then 3 tabs for 2 days, 2 tabs for 2 days, then 1 tab by mouth daily for 2 days 08/26/22  Yes Nuala Alpha A, PA-C  citalopram (CELEXA) 20 MG tablet Take 1 tablet (20 mg total) by mouth daily. 08/11/22   [name], MD  cyclobenzaprine (FLEXERIL) 10 MG tablet Take 1 tablet (10 mg total) by mouth 3 (three) times daily as needed for muscle spasms. 08/19/22   Viona Gilmore D, NP  letrozole [facility]) 2.5 MG tablet TAKE 1  TABLET BY MOUTH EVERY DAY 08/29/21   Truitt Merle, MD  levothyroxine (SYNTHROID) 75 MCG tablet Take 1 tablet (75 mcg total) by mouth daily before breakfast. 08/11/22   [name], MD  meloxicam (MOBIC) 15 MG tablet Take 1 tablet (15 mg total) by mouth daily. 07/18/22   Glennon Mac, DO  OVER THE COUNTER MEDICATION Take 1 tablet by mouth daily. Protandim Supplement    [provider]  oxyCODONE-acetaminophen (PERCOCET) 5-325 MG tablet Take 1-2 tablets by mouth every 4 (four) hours as needed for severe pain (postoperative pain). 08/19/22 08/19/23  Viona Gilmore D, NP  polyethylene glycol (MIRALAX / GLYCOLAX) 17 g packet Take 17 g by mouth daily as needed for mild constipation. 08/19/22   Viona Gilmore D, NP  Probiotic Product (PROBIOTIC PO) Take 1 capsule by mouth daily.    [provider]      Allergies    Morphine and related    Review of Systems   Review of Systems Ten systems are reviewed and are negative for acute change except as noted in the HPI  Physical Exam Updated Vital Signs BP (!) 150/88   Pulse 85   Temp 98.9 F (37.2 C) (Oral)   Resp 12   SpO2 94%  Physical Exam Constitutional:      General: She is not in acute distress.    Appearance: Normal appearance. She is well-developed. She is not ill-appearing or diaphoretic.  HENT:     Head: Normocephalic and atraumatic.  Eyes:     General: Vision grossly intact. Gaze aligned appropriately.     Pupils: Pupils are equal, round, and reactive to light.  Neck:     Trachea: Trachea and phonation normal.  Pulmonary:     Effort: Pulmonary effort is normal. No respiratory distress.  Abdominal:     General: There is no distension.     Palpations: Abdomen is soft.     Tenderness: There is no abdominal tenderness. There is no guarding or rebound.  Musculoskeletal:        General: Normal range of motion.     Cervical back: Normal range of motion.     Comments: C-collar not removed for exam given recent  surgery.  Patient has full ROM of the bilateral upper extremities.  Sensation intact in all distributions.  Strong radial pulses.  Equal and intact grip strength and push/pull.  Negative Hoffmann sign.  Capillary refill intact.  Compartments soft.  Bilateral lower extremities with good strength and sensation.  Patient is ambulatory with nursing staff without assistance.  Skin:    General: Skin is warm and dry.  Neurological:     Mental Status: She is alert.     GCS: GCS eye subscore is 4. GCS verbal subscore is 5. GCS motor subscore is 6.     Comments: Speech is clear and goal oriented, follows commands Major Cranial nerves without deficit, no facial droop Moves extremities without ataxia, coordination intact  Psychiatric:        Behavior: Behavior normal.     ED Results / Procedures / Treatments   Labs (all labs ordered are listed, but only abnormal results are displayed) Labs Reviewed - No data to display  EKG None  Radiology MR Brain W Wo Contrast  Result Date: 08/25/2022 CLINICAL DATA:  Metastatic breast cancer.  SRS planning. EXAM: MRI HEAD WITHOUT AND WITH CONTRAST TECHNIQUE: Multiplanar, multiecho pulse sequences of the brain and surrounding structures were obtained without and with intravenous contrast. CONTRAST:  68m GADAVIST GADOBUTROL 1 MMOL/ML IV SOLN COMPARISON:  Head MRI 08/18/2022 FINDINGS: Brain: There is no evidence of an acute infarct, intracranial hemorrhage, midline shift, or extra-axial fluid collection. There is mild cerebral atrophy. No significant chronic white matter disease is seen for age. There are 19 enhancing brain lesions which are annotated on series 1200. Some lesions were not well demonstrated on the prior MRI, likely due to differences in technique, and these are annotated with double arrows. The other lesions are unchanged in size, with the largest measuring 1.1 cm in the cerebellar flocculus on the left (series 1200, image 135). The largest  supratentorial lesion measures 5 mm in the medial left occipital lobe (series 1200, image 173) with minimal associated edema. There is no significant edema associated with any of the other lesions. Vascular: Major intracranial vascular flow voids are preserved. Skull and upper cervical spine: Numerous bone metastases involving the skull and cervical spine as previously described. Associated C2 pathologic fracture status post C1-C4 posterior fusion. Sinuses/Orbits: Unremarkable orbits. Paranasal sinuses and mastoid air cells are clear. Other: None. IMPRESSION: 19 brain metastases. Minimal edema associated with the left occipital lesion. Electronically Signed   By: ALogan BoresM.D.   On: 08/25/2022 11:02    Procedures Procedures    Medications Ordered in ED Medications - No data to display  ED Course/ Medical Decision Making/ A&P Clinical Course as of 08/26/22 1510  Sat Aug 26, 2022  [facility] [BM]  1432 Patient seen and evaluated Dr. Venetia Constable.  Dr. Venetia Constable spoke me in person, asked to send in the prednisone Dosepak and gabapentin.  Dr. Zada Finders advises patient may be discharged at this point suspect that this is some stenosis causing her increased pain. [BM]    Clinical Course User Index [BM] Deliah Boston, PA-C                           Medical Decision Making Patient presented for bilateral arm pain after recent cervical bone fusion about 1 week ago.  She does endorse some occasional paresthesias to her bilateral hands.  On exam she is well-appearing and in no acute distress she has a reassuring neurologic exam at this time.    Differential includes but not limited to postsurgical infection, bleed, cauda equina, myelopathy, fracture.  Amount and/or Complexity of Data Reviewed External Data Reviewed: notes. Discussion of management or test interpretation with external provider(s): Consult with neurosurgeon Dr. Venetia Constable.  Risk Prescription drug management. Risk  Details: Consult called to Dr. Venetia Constable who came and evaluated the patient.  Recommends that patient can be discharged at this time he has asked that I prescribe the patient prednisone and gabapentin, he helped use the prescriptions that were sent electronically.  Also advises that dry mouth patient experiencing is likely side effect from oxycodone.  Patient will follow-up with them outpatient.  I reassessed the patient she is resting comfortably in no acute distress.  She is requesting discharge at this time she has no further complaints or concerns.  We discussed adequate p.o. intake to avoid dehydration, clinically she has no signsofsignificantdehydration labs were canceled.  ER precautions discussed.  I discussed side effects of prednisone and gabapentin with the patient today and she and her husband stated understanding.  Patient denies any history of diabetes.  Low suspicion for myelopathy, cauda equina, infection, dehydration, sepsis or other emergent pathologies at this time.  At this time there does not appear to be any evidence of an acute emergency medical condition and the patient appears stable for discharge with appropriate outpatient follow up. Diagnosis was discussed with patient who verbalizes understanding of care plan and is agreeable to discharge. I have discussed return precautions with patient and her husband who verbalizes understanding. Patient encouraged to follow-up with their PCP and neurosurgery. All questions answered.    Note: Portions of this report may have been transcribed using voice recognition software. Every effort was made to ensure accuracy; however, inadvertent computerized transcription errors may still be present.         Final Clinical Impression(s) / ED Diagnoses Final diagnoses:  Post-operative pain    Rx / DC Orders ED Discharge Orders          Ordered    gabapentin (NEURONTIN) 100 MG capsule  3 times daily        08/26/22 1432     predniSONE (STERAPRED UNI-PAK 21 TAB) 10 MG (21) TBPK tablet  Daily        08/26/22 1432              Gari Crown 08/26/22 1510    Valarie Merino, MD 08/26/22 [phone]

## 2022-08-29 NOTE — Progress Notes (Signed)
Radiation Oncology         [phone] ________________________________  Outpatient Re-Consultation  Name: Martha [name] MRN: [id number]  Date: 08/30/2022  DOB: 1961-03-29  HC:WCBJSE, [name], MD  [name], MD   REFERRING PHYSICIAN: [name], MD  DIAGNOSIS:    ICD-10-CM   1. Secondary malignant neoplasm of brain and spinal cord [facility])  C79.31 Ambulatory referral to [facility]   C79.49     2. Metastatic adenocarcinoma ([place])  C79.9 CANCELED: Ambulatory referral to Home Health    3. Cancer, metastatic to bone ([place])  C79.51     4. Secondary cancer of brain ([place])  C79.31      STAGE IV Breast Cancer    CHIEF COMPLAINT: Here to discuss management of brain and spinal metastases from left breast cancer primary  Narrative / Interval History ::Martha [name] is a 62 y.o. female who returns today for discussion of brain radiation in management of her recently diagnosed brain metastases, as well as post-op EBRT to the spine in management of her osseous metastases from left breast cancer primary.   In the interval since her last visit, the patient presented for a PET scan on 08/03/22 which showed: low right cervical and probable right axillary nodal metastasis; known large volume relatively diffuse osseous metastases within C1-2 with an SUV max of 11.7, and an index L4 lesion with an SUV max of 12.1; subtle heterogeneous activity throughout the liver with probable small liver lesions raising potential concern for hepatic metastases; and right-sided pleural thickening with trace pleural fluid.        Accordingly, the patient followed up with Dr. Burr Medico on 08/04/22 who recommended proceeding with bone biopsies to confirm metastatic disease, followed by Zometa infusion every 3 months. If her biopsies confirm breast primary, Dr. Burr Medico recommends fulvestrant injections and Verzenio.       CT of the cervical spine on 08/08/22 showed no interval change in the pathologic  fracture of the C2 vertebral body since her MRI performed on 07/24/22. CT also showed mixed lytic sclerotic metastatic disease affecting the skull base, cervical spine and upper thoracic spine, as well as spinal stenosis at C4-5, C5-6 and C6-7 due to spondylosis.   MRI of the cervical spine on 08/12/22 redemonstrated the extensive osseous metastatic disease within the cervical spine, included portions of the skull base, and included portions of the upper thoracic spine. MRI also redemonstrated: pathologic fracture through the base of the dens and C2 vertebral body, unchanged anterior displacement of the dens and C1 vertebra relative to the C2 vertebral body (with resultant mild C1-C2 spinal canal narrowing), and an unchanged pathologic C6 vertebral compression fracture with mild vertebral body height loss, multifocal enhancing extraosseous tumor (greatest ventrally and laterally at C1-C2 and on the right at C2-C3). Most notably, MRI revealed two enhancing lesions within/along the left cerebellar hemisphere, likely reflecting intracranial metastases. (Remainder of MRI findings detail spondylosis and stenosis throughout the cervical spine).   The patient was then referred to neurosurgery and opted to proceed with posterior cervical fusion of C1-4, C1 laminectomy reduction of the C2 fracture, and biopsy of the C2 lesion on 08/17/22. Pathology from the procedure revealed metastatic adenocarcinoma consistent with breast cancer primary.    MRI of the brain with and without contrast on 08/18/22 confirmed multiple scattered intraparenchymal metastatic deposits involving both cerebral hemispheres as well as the cerebellum. Minimal surrounding edema is associated with a few of these lesion, but without significant regional mass effect  or midline shift. MRI also redemonstrated widespread osseous metastatic disease throughout the visualized upper cervical spine and calvarium, as well as associated pathologic fracture of  C2.  MRI of the brain for potential SRS planning on 08/25/22 showed a total of 19 brain metastases. Minimal edema was again seen associated with the left occipital lesion.   Of note: the patient recently presented to the ED on 08/26/22 for evaluation of bilateral arm pain x 2 days. She reported that the pain radiated down to her fingers and associated occasional numbness. Dr. Venetia Constable was consulted who recommended prednisone and gabapentin at discharge.   She denies significant back pain beyond her neck.  She is wearing a cervical collar most of the time.  She reports that she has not had excessive alcohol for weeks.  Occasionally she will have 1 or 2 drinks per night.  I have personally reviewed her imaging.  She is here with a close friend today.  She is ambulatory.    HPI 08/01/22 ::Martha [name] is a 62 y.o. female who returns today for re-evaluation and for consideration of radiation therapy in management of her recently diagnosed osseous metastatic disease from breast cancer primary. I last met with the the patient on 07/12/2018 for follow-up of radiation therapy to the left breast and axilla.    Since she was last seen, the patient was diagnosed with right breast LCIS in July 2021, s/p lumpectomy with Dr. Barry Dienes on 03/18/2020. Later in 2021, she was found to have 2 new lesions in the lower outer and lower inner right breast. These were biopsied and showed no evidence of malignancy, but findings consistent with fibroadenoma from one specimen, and focal atypical ductal hyperplasia in a complex sclerosing lesion from the other specimen. Repeat diagnostic right breast mammogram performed on 01/20/21 showed an increase in size of the outer right breast calcifications spanning 2.3 cm. Subsequently, the patient was referred back to Dr. Barry Dienes and underwent lumpectomies to the right upper outer and right lower inner breast on 04/28/21. 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    In recent history, the patient presented to Dr. Glennon Mac at [facility] on 07/18/22 with the cc of neck pain and a radiating right sided headache for the past 4 months (since July).  She was prescribed meloxicam 15 mg daily for 2 weeks by sports medicine.    Before seeking evaluation by sports medicine, the patient was also seen by PT and massage therapy for her pain with some relief. She however had several instances of painful neck spasms following certain movements during that time.    MRI of the cervical spine without contrast ordered by Dr. Glennon Mac on 07/24/22 unfortunately revealed evidence of extensive osseous metastatic disease throughout the cervical spine. Most notably, a very large deposit is seen at C2 with pathologic fracture through the base of the dens. MRI also showed: extensive involvement of C2 by a right lateral mass; extraosseous tumor at C1 and C2 likely causing impingement on the right C2 and C3 nerve roots; degenerative cord impingement at C4-5 and C6-7; and bi-foraminal impingement at C5-6 and C6-7.    Given the above findings (specifically the dens fracture), Dr. Glennon Mac advised the patient to obtain a cervical collar for protection until she can be evaluated by neurosurgery. However, the patient encountered issues findings a properly sized collar  on her own given her small/petite stature. This prompted her to present to the ED for c-collar fitting on 07/25/22,   Dr. Glennon Mac has also referred the patient back to Dr. Burr Medico who followed the patient in 2019 for her left breast cancer. She is scheduled to meet with Dr. Burr Medico for re-evaluation on 08/04/22.   Of note: the patient had an x-ray of the cervical spine performed on 05/04/22 for evaluation of her neck pain which showed findings consistent with  cervical spondylosis and nonspecific straightening of the expected cervical lordosis.       Today she is in her cervical collar which she states has helped alleviate some pain. She has tried steroids and muscle relaxers which have not helped with the pain. She also reports a 2 week history of some mid-paraspinal pain that is present on both sides of her spine. She has tried wrapping her a brace around her back which has provided little pain relief. She has also been experiencing urinary urgency at night. This has been going on for about 2 weeks as well. She denies any incontinence or dysuria.  No extremity numbness or weakness.   PREVIOUS RADIATION THERAPY: Yes, under the care of myself in 2019 for left breast cancer   Indication for treatment:  Curative      Radiation treatment dates:   04/22/2018 - 06/03/2018 Site/dose:    1. Left Breast / 50 Gy in 25 fractions 2. Left SCV, axilla nodes / 50 Gy in 25 fractions 3. Left Breast Boost / 10 Gy in 5 fractions Beams/energy:    1. 3D / 6X Photon 2. 3D / 10X, 6X Photon 3. isodose / 6X Photon  PREVIOUS RADIATION THERAPY: Yes as above  PAST MEDICAL HISTORY:  has a past medical history of Anxiety, Cancer ([place]) (2022), Cancer ([place]) (2019), Depression, Dysrhythmia, Ectopic pregnancy, Family history of breast cancer, Family history of melanoma, History of radiation therapy (04/22/18-06/03/18), Hypothyroidism, Personal history of radiation therapy, PONV (postoperative nausea and vomiting), and Thyroid disease.    PAST SURGICAL HISTORY: Past Surgical History:  Procedure Laterality Date   APPENDECTOMY     BILATERAL SALPINGECTOMY     BREAST BIOPSY Right 2014   fibroadenoma   BREAST BIOPSY Left 01/16/2018   BREAST BIOPSY Right 02/02/2020   LCIS   BREAST BIOPSY Right 08/04/2020   x2 LCIS   BREAST BIOPSY Right 08/13/2020   x2   BREAST EXCISIONAL BIOPSY Left 1990   benign   BREAST EXCISIONAL BIOPSY Right 02/02/2020   LCIS   BREAST LUMPECTOMY Left  01/22/2018   BREAST LUMPECTOMY WITH AXILLARY LYMPH NODE BIOPSY Left 02/21/2018   Procedure: LEFT BREAST LUMPECTOMY WITH AXILLARY LYMPH NODE BIOPSY;  Surgeon: [name], MD;  Location: [facility];  Service: General;  Laterality: Left;   BREAST LUMPECTOMY WITH RADIOACTIVE SEED LOCALIZATION Right 03/18/2020   Procedure: RIGHT BREAST LUMPECTOMY WITH RADIOACTIVE SEED LOCALIZATION;  Surgeon: [name], MD;  Location: [facility];  Service: General;  Laterality: Right;   BREAST SURGERY Left 1993   cyst removed   ENDOMETRIAL ABLATION     EYE SURGERY     GLAUCOMA SURGERY Bilateral    POSTERIOR CERVICAL FUSION/FORAMINOTOMY N/A 08/17/2022   Procedure: Cervical One-Cervical Four POSTERIOR CERVICAL FUSION,  Cervical One LAMINECTOMY REDUCTION OF Cervical Two Fracture, Biopsy of Right Cerical Two Pars  Lesion;  Surgeon: Vallarie Mare, MD;  Location: [facility];  Service: Neurosurgery;  Laterality: N/A;   RADIOACTIVE SEED GUIDED EXCISIONAL BREAST BIOPSY Right 04/28/2021  Procedure: RADIOACTIVE SEED GUIDED EXCISIONAL RIGHT BREAST BIOPSY X2;  Surgeon: [name], MD;  Location: [facility];  Service: General;  Laterality: Right;   RE-EXCISION OF BREAST LUMPECTOMY Left 03/20/2018   Procedure: RE-EXCISION OF BREAST LUMPECTOMY;  Surgeon: [name], MD;  Location: [facility];  Service: General;  Laterality: Left;    FAMILY HISTORY: family history includes Breast cancer in her cousin and other family members; Dementia in her paternal grandmother; Heart disease in her mother; Hyperlipidemia in her sister; Lung cancer in her maternal uncle; Lung disease in her paternal grandfather; Melanoma (age of onset: 41) in her sister; Pneumonia in her father and maternal uncle.  SOCIAL HISTORY:  reports that she has been smoking cigarettes. She has a 40.00 pack-year smoking history. She has never used smokeless tobacco. She reports current alcohol use of about 28.0 standard drinks of alcohol per  week. She reports that she does not use drugs. (Addendum: recent alcohol consumption has been much reduced and sporadic, no more than 1-2 drinks occasionally per night)  ALLERGIES: Morphine and related  MEDICATIONS:  Current Outpatient Medications  Medication Sig Dispense Refill   citalopram (CELEXA) 20 MG tablet Take 1 tablet (20 mg total) by mouth daily. 90 tablet 3   cyclobenzaprine (FLEXERIL) 10 MG tablet Take 1 tablet (10 mg total) by mouth 3 (three) times daily as needed for muscle spasms. 30 tablet 0   gabapentin (NEURONTIN) 100 MG capsule Take 1 capsule (100 mg total) by mouth 3 (three) times daily for 15 days. 45 capsule 0   letrozole (FEMARA) 2.5 MG tablet TAKE 1 TABLET BY MOUTH EVERY DAY 90 tablet 3   levothyroxine (SYNTHROID) 75 MCG tablet Take 1 tablet (75 mcg total) by mouth daily before breakfast. 90 tablet 3   OVER THE COUNTER MEDICATION Take 1 tablet by mouth daily. Protandim Supplement     oxyCODONE-acetaminophen (PERCOCET) 5-325 MG tablet Take 1-2 tablets by mouth every 4 (four) hours as needed for severe pain (postoperative pain). 20 tablet 0   predniSONE (STERAPRED UNI-PAK 21 TAB) 10 MG (21) TBPK tablet Take by mouth daily. Take 6 tabs by mouth daily  for 2 days, then 5 tabs for 2 days, then 4 tabs for 2 days, then 3 tabs for 2 days, 2 tabs for 2 days, then 1 tab by mouth daily for 2 days 42 tablet 0   Probiotic Product (PROBIOTIC PO) Take 1 capsule by mouth daily.     meloxicam (MOBIC) 15 MG tablet Take 1 tablet (15 mg total) by mouth daily. (Patient not taking: Reported on 08/30/2022) 30 tablet 0   polyethylene glycol (MIRALAX / GLYCOLAX) 17 g packet Take 17 g by mouth daily as needed for mild constipation. (Patient not taking: Reported on 08/30/2022) 14 each 0   No current facility-administered medications for this encounter.    REVIEW OF SYSTEMS:  Notable for that above.   PHYSICAL EXAM:  height is _0  (1.6 m) and weight is 97 lb 9.6 oz (44.3 kg). Her temperature is  97.7 F (36.5 C). Her blood pressure is 145/71 (abnormal) and her pulse is 113 (abnormal). Her respiration is 20 and oxygen saturation is 100%.   General: Alert and oriented, in no acute distress  HEENT: Head is normocephalic. Extraocular movements are intact. Oropharynx is clear.  No thrush. Neck: Over the posterior neck surgical scar is healing satisfactorily.  C-collar was removed for exam. Heart: Regular in rate and rhythm with no murmurs, rubs, or gallops.  Chest: Clear to auscultation bilaterally, with no rhonchi, wheezes, or rales. Abdomen: Soft, nontender, nondistended, with no rigidity or guarding. Extremities: No cyanosis or edema. Skin: No concerning lesions. Musculoskeletal: symmetric strength and muscle tone throughout.  Ambulatory. Neurologic: Cranial nerves II through XII are grossly intact. No obvious focalities. Speech is fluent. Coordination is intact. Psychiatric: Judgment and insight are intact. Affect is appropriate.   ECOG = 1  0 - Asymptomatic (Fully active, able to carry on all predisease activities without restriction)  1 - Symptomatic but completely ambulatory (Restricted in physically strenuous activity but ambulatory and able to carry out work of a light or sedentary nature. For example, light housework, office work)  2 - Symptomatic, <50% in bed during the day (Ambulatory and capable of all self care but unable to carry out any work activities. Up and about more than 50% of waking hours)  3 - Symptomatic, >50% in bed, but not bedbound (Capable of only limited self-care, confined to bed or chair 50% or more of waking hours)  4 - Bedbound (Completely disabled. Cannot carry on any self-care. Totally confined to bed or chair)  5 - Death   Eustace Pen MM, Creech RH, Tormey DC, et al. [phone]). "Toxicity and response criteria of the [facility]". [place] Oncol. 5 (6): 649-55   LABORATORY DATA:  Lab Results  Component Value Date   WBC 6.6  08/02/2022   HGB 14.5 08/02/2022   HCT 41.7 08/02/2022   MCV 94.8 08/02/2022   PLT 223 08/02/2022   CMP     Component Value Date/Time   NA 137 08/02/2022 1042   K 3.9 08/02/2022 1042   CL 101 08/02/2022 1042   CO2 30 08/02/2022 1042   GLUCOSE 109 (H) 08/02/2022 1042   BUN 10 08/02/2022 1042   CREATININE 0.52 08/02/2022 1042   CALCIUM 10.4 (H) 08/02/2022 1042   PROT 7.4 08/02/2022 1042   ALBUMIN 4.6 08/02/2022 1042   AST 30 08/02/2022 1042   ALT 18 08/02/2022 1042   ALKPHOS 241 (H) 08/02/2022 1042   BILITOT 0.5 08/02/2022 1042   GFRNONAA >60 08/02/2022 1042   GFRAA >60 05/21/2020 1350         RADIOGRAPHY: MR Brain W Wo Contrast  Result Date: 08/25/2022 CLINICAL DATA:  Metastatic breast cancer.  SRS planning. EXAM: MRI HEAD WITHOUT AND WITH CONTRAST TECHNIQUE: Multiplanar, multiecho pulse sequences of the brain and surrounding structures were obtained without and with intravenous contrast. CONTRAST:  49m GADAVIST GADOBUTROL 1 MMOL/ML IV SOLN COMPARISON:  Head MRI 08/18/2022 FINDINGS: Brain: There is no evidence of an acute infarct, intracranial hemorrhage, midline shift, or extra-axial fluid collection. There is mild cerebral atrophy. No significant chronic white matter disease is seen for age. There are 19 enhancing brain lesions which are annotated on series 1200. Some lesions were not well demonstrated on the prior MRI, likely due to differences in technique, and these are annotated with double arrows. The other lesions are unchanged in size, with the largest measuring 1.1 cm in the cerebellar flocculus on the left (series 1200, image 135). The largest supratentorial lesion measures 5 mm in the medial left occipital lobe (series 1200, image 173) with minimal associated edema. There is no significant edema associated with any of the other lesions. Vascular: Major intracranial vascular flow voids are preserved. Skull and upper cervical spine: Numerous bone metastases involving the  skull and cervical spine as previously described. Associated C2 pathologic fracture status post C1-C4 posterior fusion. Sinuses/Orbits: Unremarkable  orbits. Paranasal sinuses and mastoid air cells are clear. Other: None. IMPRESSION: 19 brain metastases. Minimal edema associated with the left occipital lesion. Electronically Signed   By: Logan Bores M.D.   On: 08/25/2022 11:02   MR BRAIN W WO CONTRAST  Result Date: 08/18/2022 CLINICAL DATA:  62 year old female with history of metastatic breast cancer. Evaluate CNS disease burden. EXAM: MRI HEAD WITHOUT AND WITH CONTRAST TECHNIQUE: Multiplanar, multiecho pulse sequences of the brain and surrounding structures were obtained without and with intravenous contrast. A limited stealth protocol MRI was performed. CONTRAST:  4.47m GADAVIST GADOBUTROL 1 MMOL/ML IV SOLN COMPARISON:  Comparison made with prior MRI of the cervical spine from 07/24/2022. FINDINGS: Brain: Cerebral volume within normal limits. No significant cerebral white matter disease for age. Multiple scattered intraparenchymal metastatic deposits are seen involving both cerebral hemispheres as well as the cerebellum. In total, there are at least 10 lesions. For reference purposes, the largest supratentorial lesion is seen at the left occipital pole and measures 5 mm (series 6, image 68). The largest infratentorial lesion involves the flocculus of the left cerebellum and measures 1.1 cm (series 6, image 35). No more than minimal surrounding edema about a few of these lesions on FLAIR sequence, but no significant regional mass effect or midline shift. No other definite acute intracranial abnormality evident on this limited stealth protocol MRI. No hydrocephalus or extra-axial fluid collection. No other abnormal enhancement. Vascular: Major intracranial vascular flow voids are grossly maintained. Skull and upper cervical spine: Widespread osseous metastases seen throughout the visualized upper cervical spine  in about the calvarium. Associated pathologic fracture at C2 with sequelae of prior posterior fusion at C2 through C5 noted. No scalp soft tissue abnormality. Sinuses/Orbits: Globes orbital soft tissues demonstrate no acute finding. Paranasal sinuses are largely clear. No mastoid effusion. Other: None. IMPRESSION: 1. Multiple scattered intraparenchymal metastatic deposits involving both cerebral hemispheres as well as the cerebellum. No more than minimal surrounding edema about a few of these lesions, without significant regional mass effect or midline shift. 2. Widespread osseous metastatic disease throughout the visualized upper cervical spine and calvarium. Associated pathologic fracture of C2 with sequelae of prior posterior fusion. Electronically Signed   By: BJeannine BogaM.D.   On: 08/18/2022 23:41   DG Cervical Spine 2 or 3 views  Result Date: 08/17/2022 CLINICAL DATA:  Fluoro guidance provided EXAM: CERVICAL SPINE - 2-3 VIEW FINDINGS: Dose: 3.2 mGy Fluoro time 63s IMPRESSION: C-arm fluoro guidance provided. Electronically Signed   By: JSammie BenchM.D.   On: 08/17/2022 18:01   DG C-Arm 1-60 Min-No Report  Result Date: 08/17/2022 Fluoroscopy was utilized by the requesting physician.  No radiographic interpretation.   DG C-Arm 1-60 Min-No Report  Result Date: 08/17/2022 Fluoroscopy was utilized by the requesting physician.  No radiographic interpretation.   DG C-Arm 1-60 Min-No Report  Result Date: 08/17/2022 Fluoroscopy was utilized by the requesting physician.  No radiographic interpretation.   MR CERVICAL SPINE W WO CONTRAST  Result Date: 08/13/2022 CLINICAL DATA:  Provided history: Other closed nondisplaced fracture of second cervical vertebra, initial encounter. Additional history obtained from prior radiology records: History of metastatic breast cancer. EXAM: MRI CERVICAL SPINE WITHOUT AND WITH CONTRAST TECHNIQUE: Multiplanar and multiecho pulse sequences of the  cervical spine, to include the craniocervical junction and cervicothoracic junction, were obtained without and with intravenous contrast. CONTRAST:  56mGADAVIST GADOBUTROL 1 MMOL/ML IV SOLN COMPARISON:  CT cervical spine 08/08/2022. PET-CT 08/03/2022. Cervical spine MRI 07/24/2022. FINDINGS: Alignment:  Anterior displacement of the dens and C1 vertebra relative to the C2 vertebral body, unchanged from the recent prior cervical spine CT of 08/08/2022 and MRI cervical spine of 07/24/2022 (at site of a pathologic fracture within the base of the dens and C2 vertebral body). The posterior cortex of the C6 vertebral body bulges posteriorly by 2 mm due to osseous metastatic disease and a pathologic compression fracture. Vertebrae: Extensive multifocal signal abnormality and pathologic enhancement within the cervical spine, imaged portions of the skull base and imaged portions of the upper thoracic spine compatible with osseous metastatic disease. Most notably, there is prominent involvement of the dens, C2 vertebral body, right C2 lateral mass, the posterior elements on the right at C2 and the right C1 lateral mass. Redemonstrated pathologic fracture through the base of the dens and C2 vertebral body with unchanged displacement, as described above. New from the prior cervical spine MRI of 07/24/2022, there is a vertically oriented pathologic fracture within the C5 vertebral body with mild C5 vertebral body height loss (for instance as seen on series 2, image 9) (series 6, image 19). Mild vertebral body height loss and mild bulging of the posterior vertebral body cortex at C6 has not significantly changed. Cord: No spinal cord signal abnormality. Multilevel spinal cord flattening, as described below. Posterior Fossa, vertebral arteries, paraspinal tissues: Two enhancing lesions along/within the left cerebellar hemisphere measuring up to 9 mm, likely reflecting intracranial metastases (series 8, images 15 and 17). Multifocal  enhancing extraosseous tumor, greatest ventrally and laterally at C1-C2 and on the right at C2-C3 (for instance as seen on series 9, images 3-9). Extraosseous tumor appears to encase the right vertebral artery at the C2-C3 level. Suspected subtle multifocal enhancing epidural tumor within the cervical spine (for instance as seen posterior to the C6 vertebral body on series 8, image 9 and on series 9, image 22). Disc levels: Unless otherwise stated, the level by level findings below have not significantly changed from the prior MRI of 07/24/2022. Multilevel disc space narrowing, greatest at C5-C6 and C6-C7 (moderate at these levels). C1-C2: Mild spinal canal narrowing, predominantly due to anterior displacement of the dens and C1 vertebra relative to the C2 vertebral body. C2-C3: No significant disc herniation or spinal canal stenosis. Facet arthrosis. Extraosseous tumor infiltrates the right neural foramen. C3-C4: Mild facet arthrosis. No significant disc herniation or stenosis. C4-C5: Central disc extrusion with caudal migration to the level of the mid C5 vertebral body. Facet arthrosis. The disc extrusion contributes to moderate spinal canal stenosis, focally effacing the ventral thecal sac and flattening the ventral aspect of the spinal cord. No significant foraminal stenosis. C5-C6: There is posterior bulging of the C6 vertebral body cortex by 2 mm due to osseous metastatic disease and a pathologic vertebral compression fracture. Disc bulge. Bilateral uncovertebral hypertrophy and facet arthrosis. Mild ligamentum flavum thickening. As noted above, there is suspected subtle epidural enhancing tumor at this level. Moderate/severe spinal canal stenosis with at least mild spinal cord flattening. Severe bilateral neural foraminal narrowing. C6-C7: There is posterior bulging of the C6 vertebral body cortex by 2 mm due to osseous metastatic disease and a pathologic vertebral compression fracture. Disc bulge with  bilateral uncovertebral hypertrophy and facet arthrosis. Mild ligamentum flavum thickening. Moderate/severe spinal canal stenosis with at least mild spinal cord flattening. Severe bilateral neural foraminal narrowing. C7-T1: Facet arthrosis. No significant disc herniation or stenosis. Impressions #3 and #10 will be called to the ordering clinician or representative by the Radiologist Assistant, and  communication documented in the PACS or [employer]. IMPRESSION: 1. Extensive osseous metastatic disease again demonstrated within the cervical spine, included portions of the skull base and included portions of the upper thoracic spine. 2. Redemonstrated pathologic fracture through the base of the dens and C2 vertebral body. Unchanged anterior displacement of the dens and C1 vertebra relative to the C2 vertebral body with resultant mild C1-C2 spinal canal narrowing. 3. New from the prior MRI of 07/24/2022, there is a vertically oriented pathologic fracture within the C5 vertebral body with mild vertebral body height loss. No significant bony retropulsion at this level. 4. Unchanged pathologic C6 vertebral compression fracture with mild vertebral body height loss, and with bulging of the posterior vertebral body cortex by 2 mm. 5. Multifocal enhancing extraosseous tumor, greatest ventrally and laterally at C1-C2 and on the right at C2-C3. Extraosseous tumor appears to encase the right vertebral artery at the C2-C3 level. Also of note, extraosseous tumor invades the right C2-C3 neural foramen. 6. Suspect subtle multifocal enhancing epidural tumor (for instance posterior to the C6 vertebral body). 7. Cervical spondylosis as outlined, similar to the prior MRI of 07/24/2022 and with findings most notably as follows. 8. At C4-C5, a caudally migrated central disc extrusion results in moderate spinal canal stenosis and flattens the ventral aspect of the spinal cord. 9. At C5-C6 and C6-C7, posterior bulging of the C6  vertebral body (due to osseous metastatic disease and a pathologic compression fracture) and superimposed spondylosis result in multifactorial moderate/severe spinal canal stenosis with at least mild spinal cord flattening. No appreciable spinal cord signal abnormality at these levels. 10. Two enhancing lesions within/along the left cerebellar hemisphere, likely reflecting intracranial metastases. A brain MRI without and with contrast is recommended for further evaluation. Electronically Signed   By: Kellie Simmering D.O.   On: 08/13/2022 15:55   CT CERVICAL SPINE WO CONTRAST  Result Date: 08/08/2022 CLINICAL DATA:  Widely metastatic breast cancer. EXAM: CT CERVICAL SPINE WITHOUT CONTRAST TECHNIQUE: Multidetector CT imaging of the cervical spine was performed without intravenous contrast. Multiplanar CT image reconstructions were also generated. RADIATION DOSE REDUCTION: This exam was performed according to the departmental dose-optimization program which includes automated exposure control, adjustment of the mA and/or kV according to patient size and/or use of iterative reconstruction technique. COMPARISON:  MRI 07/24/2022.  Radiography 07/26/2022. FINDINGS: Alignment: Anterolisthesis of C1 and the dens relative to the body of C2 of 1 cm, unchanged since the MRI of 07/24/2022, due to pathologic fracture of the C2 vertebral body. Alignment otherwise normal. Skull base and vertebrae: Mixed lytic sclerotic metastatic disease affecting the skull base, cervical spine and upper thoracic spine with diffuse disease. Soft tissues and spinal canal: No discernible soft tissue compromise of the canal. This is better demonstrated by MRI. Disc levels: There is stenosis of the canal at the C2 level because of the anterolisthesis. Based on the MRI, the cord is not visibly compressed. Space between the body of C2 in the posterior arch of C1 measures 8 mm as seen previously. There is spinal stenosis at C4-5, C5-6 and C6-7 because  of spondylosis, with foraminal stenosis at those levels, as shown by MRI. Upper chest: See results of prior chest imaging/PET scan. Other: None IMPRESSION: 1. Pathologic fracture of the C2 vertebral body with 1 cm of anterolisthesis of C1 and the dens relative to the body of C2, unchanged since the MRI of 07/24/2022. Spinal stenosis at C2 because of the anterolisthesis. Based on the MRI, the cord is  not visibly compressed. There is no sign that this has progressed. 2. Mixed lytic sclerotic metastatic disease affecting the skull base, cervical spine and upper thoracic spine. 3. Spinal stenosis at C4-5, C5-6 and C6-7 because of spondylosis, with foraminal stenosis at those levels, as shown by MRI. Electronically Signed   By: Nelson Chimes M.D.   On: 08/08/2022 16:15   NM PET Image Initial (PI) Skull Base To Thigh  Result Date: 08/04/2022 CLINICAL DATA:  Initial treatment strategy for spinal lesions with history of breast cancer. Evaluate for new primary versus metastatic disease. EXAM: NUCLEAR MEDICINE PET SKULL BASE TO THIGH TECHNIQUE: 5.2 mCi F-18 FDG was injected intravenously. Full-ring PET imaging was performed from the skull base to thigh after the radiotracer. CT data was obtained and used for attenuation correction and anatomic localization. Fasting blood glucose: 120 mg/dl COMPARISON:  Cervical spine MRI of 07/24/2022. Chest CT of 07/16/2020. FINDINGS: Mediastinal blood pool activity: SUV max 2.0 Liver activity: SUV max NA NECK: A low right jugular node measures 5 mm and a S.U.V. max of 4.5 on 37/4. Incidental CT findings: No cervical adenopathy. CHEST: Right axillary node measures 6 mm and a S.U.V. max of 1.7 on 55/4. No mediastinal/hilar nodal or pulmonary parenchymal hypermetabolism. Incidental CT findings: Mild cardiomegaly. Aortic atherosclerosis. Right-sided pleural thickening and trace pleural fluid. Dependent right lower lobe airspace disease is likely due to chronic atelectasis. Mild centrilobular  and paraseptal emphysema. Subpleural 4 mm right upper lobe pulmonary nodule on 53/4 is present in 2021 and considered benign. ABDOMEN/PELVIS: No abdominopelvic nodal hypermetabolism. Heterogeneous hepatic activity is equivocal but suspicious for underlying small volume hepatic metastasis. Example within the anterior left lobe at a S.U.V. max of 3.0 on approximately image 110/4 and within the posterior left lobe at a S.U.V. max of 3.0 on approximately image 116/4. Suspicion of subtle concurrent hypoattenuating foci throughout the liver including in the right lobe on 100/4 at 7-8 mm x 2. Incidental CT findings: Abdominal aortic atherosclerosis. Normal adrenal glands. Abdominal aortic atherosclerosis. Pelvic floor laxity. No free fluid or air. SKELETON: Large volume relatively diffuse osseous metastasis. Example lesions within C1-2 at a S.U.V. max of 11.7 on 17/4. Thoracolumbar vertebral body lesions also identified with an index L4 lesion measuring a S.U.V. max of 12.1. Incidental CT findings: None. IMPRESSION: 1. Large volume osseous metastasis. 2. Low right cervical and probable right axillary nodal metastasis. 3. Subtle heterogeneous activity throughout the liver with suggestion of small liver lesions (likely new compared to chest CT of 07/16/2020). Findings are overall moderately suspicious for hepatic metastasis. Pre and post contrast abdominal MRI (preferred) or CT could confirm. 4. Right-sided pleural thickening and trace pleural fluid. Right base airspace disease is favored to represent chronic atelectasis. 5. Aortic atherosclerosis (ICD10-I70.0) and emphysema (ICD10-J43.9). Electronically Signed   By: Abigail Miyamoto M.D.   On: 08/04/2022 16:11      IMPRESSION/PLAN: Breast cancer with bone metastases and brain metastases  Today, I talked to the patient about the findings and work-up thus far.  We discussed the patient's diagnosis of metastatic breast cancer and general treatment for this, highlighting the  role of radiotherapy in the management.  We discussed the available radiation techniques, and focused on the details of logistics and delivery.     First we discussed treatment of her cervical spine.  I recommend postoperative radiation to the cervical spine to the most worrisome bone lesions to protect her spinal cord.  This will likely be performed with IMRT but a 3D  plan will be considered if feasible.  Anticipate 10 fractions to a total dose of 30 Gray.  She understands that we can treat other bone metastases in the future if they become symptomatic.  She does not have any significant symptoms from the other bone metastases noted on her PET scan.  The side effects of treatment to the cervical spine may include but not necessarily be limited to fatigue, salivary changes, taste changes, skin irritation, esophagitis.  Consent form was signed and placed in her chart.  She is enthusiastic to proceed.  We then discussed options for radiation to the 19 brain metastases.  There is not a clear standard of care when patients have these many metastases.  Some patients undergo whole brain radiation while others pursue focal stereotactic radiosurgery.  We discussed the pros and cons of both treatments.  She is an excellent candidate for the CE.7  clinical trial that is open here at our center.  This randomizes patients to stereotactic radiosurgery versus hippocampal whole brain radiation and memantine.   We discussed both treatment options in detail and again reviewed the pros and cons.  Patient is enthusiastic about this trial and she understands it is completely optional.  She understands that she would be randomized to 1 of these 2 treatment arms if she enrolls.  She met with the research nurse and is heavily leaning towards this trial.  The research nurse is going to contact her soon to go over paperwork and enrollment.  After she has randomized we will be able to determine the treatment plan.    Today we will  conduct CT simulation for her cervical spine and her brain with and make an immobilization mask as well as obtain imaging that is conducive to brain stereotactic radiosurgery just in case she is randomized to that arm.  ECOG performance status is 1.  KPS is 80.  We will get in touch with her to find out if she is right or left handed  DS-GPA predicted median overall survival: <6 months   Does she have planned use of targeted or immunotherapy w/in 4 weeks of RT? Not sure, will find out from Dr. Burr Medico  Histology (radio-resistant vs other): OTHER  Mets w/in 5 mm of one hippocampus: NO    On date of service, in total, I spent 60 minutes on this encounter. Patient was seen in person. Note was signed after date of service but minutes pertaining to date of service only.  __________________________________________   Eppie Gibson, MD  This document serves as a record of services personally performed by Eppie Gibson, MD. It was created on her behalf by Roney Mans, a trained medical scribe. The creation of this record is based on the scribe's personal observations and the provider's statements to them. This document has been checked and approved by the attending provider.

## 2022-08-30 ENCOUNTER — Ambulatory Visit
Admission: RE | Admit: 2022-08-30 | Discharge: 2022-08-30 | Disposition: A | Discharge status: Home / Self Care | Payer: BC Managed Care – PPO | Source: Ambulatory Visit | Attending: Radiation Oncology | Admitting: Radiation Oncology

## 2022-08-30 ENCOUNTER — Encounter: Payer: Self-pay | Admitting: Radiology

## 2022-08-30 ENCOUNTER — Other Ambulatory Visit: Payer: Self-pay

## 2022-08-30 ENCOUNTER — Inpatient Hospital Stay: Payer: BC Managed Care – PPO | Attending: Genetic Counselor

## 2022-08-30 ENCOUNTER — Encounter: Payer: Self-pay | Admitting: Radiation Oncology

## 2022-08-30 VITALS — BP 145/71 | HR 113 | Temp 97.7°F | Resp 20 | Ht 63.0 in | Wt 97.6 lb

## 2022-08-30 VITALS — BP 144/75 | HR 94 | Temp 98.7°F | Resp 18

## 2022-08-30 DIAGNOSIS — Z170 Estrogen receptor positive status [ER+]: Secondary | ICD-10-CM | POA: Insufficient documentation

## 2022-08-30 DIAGNOSIS — C7931 Secondary malignant neoplasm of brain: Secondary | ICD-10-CM

## 2022-08-30 DIAGNOSIS — Z79899 Other long term (current) drug therapy: Secondary | ICD-10-CM | POA: Diagnosis not present

## 2022-08-30 DIAGNOSIS — Z79811 Long term (current) use of aromatase inhibitors: Secondary | ICD-10-CM | POA: Insufficient documentation

## 2022-08-30 DIAGNOSIS — C50312 Malignant neoplasm of lower-inner quadrant of left female breast: Secondary | ICD-10-CM | POA: Diagnosis not present

## 2022-08-30 DIAGNOSIS — C7951 Secondary malignant neoplasm of bone: Secondary | ICD-10-CM

## 2022-08-30 DIAGNOSIS — C799 Secondary malignant neoplasm of unspecified site: Secondary | ICD-10-CM

## 2022-08-30 MED ORDER — SODIUM CHLORIDE 0.9% FLUSH: 3.0000 mL | Freq: Once | INTRAVENOUS | Status: DC

## 2022-08-30 MED ORDER — FULVESTRANT 250 MG/5ML IM SOSY
500.0000 mg | PREFILLED_SYRINGE | Freq: Once | INTRAMUSCULAR | Status: AC
  Administered 2022-08-30: 500 mg via INTRAMUSCULAR
  Filled 2022-08-30: qty 10

## 2022-08-30 NOTE — Patient Instructions (Signed)

## 2022-08-30 NOTE — Progress Notes (Signed)
Has armband been applied?  Yes.    Does patient have an allergy to IV contrast dye?: No.   Has patient ever received premedication for IV contrast dye?: No.   Does patient take metformin?: No.  If patient does take metformin when was the last dose: na  Date of lab work: 08-02-22 BUN: 10 CR: 0.52 eGfr: greater than 60 IV site: RT antecubital- Well tolerated by patient.  Has IV site been added to flowsheet?  Yes.    There were no vitals taken for this visit.

## 2022-08-30 NOTE — Research (Signed)
A PHASE III TRIAL OF STEREOTACTIC RADIOSURGERY COMPARED WITH HIPPOCAMPAL-AVOIDANT WHOLE BRAIN RADIOTHERAPY (HA-WBRT) PLUS MEMANTINE FOR 5-15 BRAIN METASTASES   08/30/2022  CONSENT INTRO:  Patient Martha [name] was identified by Dr. Isidore Moos as a potential candidate for the above listed study.  This Clinical Research Coordinator met with Essence Merle, [id number], on 08/30/22 in a manner and location that ensures patient privacy to discuss participation in the above listed research study.  Patient is Accompanied by family .  A copy of the informed consent document and separate HIPAA Authorization was provided to the patient.  Patient reads, speaks, and understands [place].   Patient was provided with the business card of this Coordinator and encouraged to contact the research team with any questions.  Approximately 15 minutes were spent with the patient reviewing the informed consent documents.  Patient was provided the option of taking informed consent documents home to review and was encouraged to review at their convenience with their support network, including other care providers. Patient took the consent documents home to review. Will follow-up with patient later this afternoon or tomorrow to verify interest.   Thanked patient for her time and consideration of the above mentioned study.   Carol Ada, RT(R)(T) Clinical Research Coordinator

## 2022-08-31 ENCOUNTER — Telehealth: Payer: Self-pay | Admitting: Hematology

## 2022-08-31 NOTE — Telephone Encounter (Signed)
Left patient a voicemail regarding injection appointments

## 2022-09-01 ENCOUNTER — Encounter: Payer: Self-pay | Admitting: Radiation Oncology

## 2022-09-01 ENCOUNTER — Telehealth: Payer: Self-pay | Admitting: Radiology

## 2022-09-01 ENCOUNTER — Other Ambulatory Visit: Payer: Self-pay | Admitting: Hematology

## 2022-09-01 DIAGNOSIS — C7931 Secondary malignant neoplasm of brain: Secondary | ICD-10-CM | POA: Insufficient documentation

## 2022-09-01 DIAGNOSIS — C7951 Secondary malignant neoplasm of bone: Secondary | ICD-10-CM | POA: Diagnosis not present

## 2022-09-01 DIAGNOSIS — F1010 Alcohol abuse, uncomplicated: Secondary | ICD-10-CM | POA: Diagnosis not present

## 2022-09-01 DIAGNOSIS — R2689 Other abnormalities of gait and mobility: Secondary | ICD-10-CM | POA: Diagnosis not present

## 2022-09-01 DIAGNOSIS — E039 Hypothyroidism, unspecified: Secondary | ICD-10-CM | POA: Diagnosis not present

## 2022-09-01 DIAGNOSIS — C7949 Secondary malignant neoplasm of other parts of nervous system: Secondary | ICD-10-CM | POA: Diagnosis not present

## 2022-09-01 DIAGNOSIS — Z4789 Encounter for other orthopedic aftercare: Secondary | ICD-10-CM | POA: Diagnosis not present

## 2022-09-01 DIAGNOSIS — S12100D Unspecified displaced fracture of second cervical vertebra, subsequent encounter for fracture with routine healing: Secondary | ICD-10-CM | POA: Diagnosis not present

## 2022-09-01 DIAGNOSIS — C799 Secondary malignant neoplasm of unspecified site: Secondary | ICD-10-CM | POA: Diagnosis not present

## 2022-09-01 DIAGNOSIS — M6281 Muscle weakness (generalized): Secondary | ICD-10-CM | POA: Diagnosis not present

## 2022-09-01 NOTE — Telephone Encounter (Signed)
A PHASE III TRIAL OF STEREOTACTIC RADIOSURGERY COMPARED WITH HIPPOCAMPAL-AVOIDANT WHOLE BRAIN RADIOTHERAPY (HA-WBRT) PLUS MEMANTINE FOR 5-15 BRAIN METASTASES   09/01/2022  PHONE CALL: Confirmed I was speaking with Martha [name] . Informed patient reason for call is to follow-up on potential interest in the above mentioned study. Patient is very interested. We will have an appt for consent and all baseline requirements on Monday 09/04/2022 at 12PM. Patient was thanked for her time and support of the above mentioned study.   Carol Ada, RT(R)(T) Clinical Research Coordinator

## 2022-09-02 ENCOUNTER — Other Ambulatory Visit: Payer: Self-pay

## 2022-09-02 NOTE — Telephone Encounter (Signed)
Pt switched to letrozole

## 2022-09-04 ENCOUNTER — Encounter (HOSPITAL_COMMUNITY): Payer: Self-pay

## 2022-09-04 ENCOUNTER — Inpatient Hospital Stay: Payer: BC Managed Care – PPO | Admitting: Radiology

## 2022-09-04 ENCOUNTER — Other Ambulatory Visit: Payer: Self-pay

## 2022-09-04 ENCOUNTER — Inpatient Hospital Stay: Payer: BC Managed Care – PPO

## 2022-09-04 ENCOUNTER — Inpatient Hospital Stay (HOSPITAL_COMMUNITY)
Admission: EM | Admit: 2022-09-04 | Discharge: 2022-09-13 | DRG: 472 | Disposition: A | Discharge status: Rehabilitation Facility | Payer: BC Managed Care – PPO | Attending: Neurosurgery | Admitting: Neurosurgery

## 2022-09-04 DIAGNOSIS — F418 Other specified anxiety disorders: Secondary | ICD-10-CM | POA: Diagnosis not present

## 2022-09-04 DIAGNOSIS — M8458XA Pathological fracture in neoplastic disease, other specified site, initial encounter for fracture: Principal | ICD-10-CM | POA: Diagnosis present

## 2022-09-04 DIAGNOSIS — R748 Abnormal levels of other serum enzymes: Secondary | ICD-10-CM | POA: Insufficient documentation

## 2022-09-04 DIAGNOSIS — F32A Depression, unspecified: Secondary | ICD-10-CM | POA: Diagnosis not present

## 2022-09-04 DIAGNOSIS — R260 Ataxic gait: Secondary | ICD-10-CM | POA: Diagnosis present

## 2022-09-04 DIAGNOSIS — Z171 Estrogen receptor negative status [ER-]: Secondary | ICD-10-CM

## 2022-09-04 DIAGNOSIS — F17210 Nicotine dependence, cigarettes, uncomplicated: Secondary | ICD-10-CM | POA: Diagnosis present

## 2022-09-04 DIAGNOSIS — G992 Myelopathy in diseases classified elsewhere: Secondary | ICD-10-CM | POA: Diagnosis present

## 2022-09-04 DIAGNOSIS — Z83438 Family history of other disorder of lipoprotein metabolism and other lipidemia: Secondary | ICD-10-CM

## 2022-09-04 DIAGNOSIS — G959 Disease of spinal cord, unspecified: Secondary | ICD-10-CM | POA: Diagnosis not present

## 2022-09-04 DIAGNOSIS — Z801 Family history of malignant neoplasm of trachea, bronchus and lung: Secondary | ICD-10-CM | POA: Diagnosis not present

## 2022-09-04 DIAGNOSIS — Z8249 Family history of ischemic heart disease and other diseases of the circulatory system: Secondary | ICD-10-CM

## 2022-09-04 DIAGNOSIS — G9389 Other specified disorders of brain: Secondary | ICD-10-CM | POA: Diagnosis not present

## 2022-09-04 DIAGNOSIS — Z803 Family history of malignant neoplasm of breast: Secondary | ICD-10-CM

## 2022-09-04 DIAGNOSIS — R202 Paresthesia of skin: Secondary | ICD-10-CM | POA: Diagnosis present

## 2022-09-04 DIAGNOSIS — T402X5A Adverse effect of other opioids, initial encounter: Secondary | ICD-10-CM | POA: Diagnosis not present

## 2022-09-04 DIAGNOSIS — Z66 Do not resuscitate: Secondary | ICD-10-CM | POA: Diagnosis present

## 2022-09-04 DIAGNOSIS — Z885 Allergy status to narcotic agent status: Secondary | ICD-10-CM

## 2022-09-04 DIAGNOSIS — R531 Weakness: Secondary | ICD-10-CM | POA: Diagnosis not present

## 2022-09-04 DIAGNOSIS — Z170 Estrogen receptor positive status [ER+]: Secondary | ICD-10-CM | POA: Diagnosis not present

## 2022-09-04 DIAGNOSIS — Z1501 Genetic susceptibility to malignant neoplasm of breast: Secondary | ICD-10-CM | POA: Diagnosis not present

## 2022-09-04 DIAGNOSIS — M47816 Spondylosis without myelopathy or radiculopathy, lumbar region: Secondary | ICD-10-CM | POA: Diagnosis not present

## 2022-09-04 DIAGNOSIS — C787 Secondary malignant neoplasm of liver and intrahepatic bile duct: Secondary | ICD-10-CM | POA: Diagnosis not present

## 2022-09-04 DIAGNOSIS — Z923 Personal history of irradiation: Secondary | ICD-10-CM | POA: Diagnosis not present

## 2022-09-04 DIAGNOSIS — Z79899 Other long term (current) drug therapy: Secondary | ICD-10-CM

## 2022-09-04 DIAGNOSIS — C50919 Malignant neoplasm of unspecified site of unspecified female breast: Secondary | ICD-10-CM | POA: Diagnosis not present

## 2022-09-04 DIAGNOSIS — I10 Essential (primary) hypertension: Secondary | ICD-10-CM | POA: Diagnosis not present

## 2022-09-04 DIAGNOSIS — M8448XA Pathological fracture, other site, initial encounter for fracture: Secondary | ICD-10-CM | POA: Diagnosis not present

## 2022-09-04 DIAGNOSIS — Z981 Arthrodesis status: Secondary | ICD-10-CM | POA: Diagnosis not present

## 2022-09-04 DIAGNOSIS — M4322 Fusion of spine, cervical region: Secondary | ICD-10-CM | POA: Diagnosis not present

## 2022-09-04 DIAGNOSIS — C50312 Malignant neoplasm of lower-inner quadrant of left female breast: Secondary | ICD-10-CM | POA: Diagnosis present

## 2022-09-04 DIAGNOSIS — N319 Neuromuscular dysfunction of bladder, unspecified: Secondary | ICD-10-CM | POA: Diagnosis not present

## 2022-09-04 DIAGNOSIS — M8580 Other specified disorders of bone density and structure, unspecified site: Secondary | ICD-10-CM | POA: Diagnosis not present

## 2022-09-04 DIAGNOSIS — M5000 Cervical disc disorder with myelopathy, unspecified cervical region: Secondary | ICD-10-CM | POA: Diagnosis not present

## 2022-09-04 DIAGNOSIS — Z79890 Hormone replacement therapy (postmenopausal): Secondary | ICD-10-CM | POA: Diagnosis not present

## 2022-09-04 DIAGNOSIS — E039 Hypothyroidism, unspecified: Secondary | ICD-10-CM | POA: Diagnosis present

## 2022-09-04 DIAGNOSIS — M4804 Spinal stenosis, thoracic region: Secondary | ICD-10-CM | POA: Diagnosis not present

## 2022-09-04 DIAGNOSIS — C7931 Secondary malignant neoplasm of brain: Secondary | ICD-10-CM | POA: Diagnosis not present

## 2022-09-04 DIAGNOSIS — M4802 Spinal stenosis, cervical region: Secondary | ICD-10-CM | POA: Diagnosis present

## 2022-09-04 DIAGNOSIS — K592 Neurogenic bowel, not elsewhere classified: Secondary | ICD-10-CM | POA: Diagnosis not present

## 2022-09-04 DIAGNOSIS — R443 Hallucinations, unspecified: Secondary | ICD-10-CM | POA: Diagnosis not present

## 2022-09-04 DIAGNOSIS — G8254 Quadriplegia, C5-C7 incomplete: Secondary | ICD-10-CM | POA: Diagnosis not present

## 2022-09-04 DIAGNOSIS — Z853 Personal history of malignant neoplasm of breast: Secondary | ICD-10-CM | POA: Diagnosis not present

## 2022-09-04 DIAGNOSIS — E871 Hypo-osmolality and hyponatremia: Secondary | ICD-10-CM | POA: Diagnosis not present

## 2022-09-04 DIAGNOSIS — G8250 Quadriplegia, unspecified: Secondary | ICD-10-CM | POA: Diagnosis not present

## 2022-09-04 DIAGNOSIS — M47814 Spondylosis without myelopathy or radiculopathy, thoracic region: Secondary | ICD-10-CM | POA: Diagnosis not present

## 2022-09-04 DIAGNOSIS — M50223 Other cervical disc displacement at C6-C7 level: Secondary | ICD-10-CM | POA: Diagnosis not present

## 2022-09-04 DIAGNOSIS — Z808 Family history of malignant neoplasm of other organs or systems: Secondary | ICD-10-CM | POA: Diagnosis not present

## 2022-09-04 DIAGNOSIS — C7951 Secondary malignant neoplasm of bone: Secondary | ICD-10-CM | POA: Diagnosis present

## 2022-09-04 DIAGNOSIS — Z515 Encounter for palliative care: Secondary | ICD-10-CM | POA: Diagnosis not present

## 2022-09-04 DIAGNOSIS — M8458XD Pathological fracture in neoplastic disease, other specified site, subsequent encounter for fracture with routine healing: Secondary | ICD-10-CM | POA: Diagnosis not present

## 2022-09-04 LAB — COMPREHENSIVE METABOLIC PANEL
ALT: 44 U/L (ref 0–44)
AST: 56 U/L — ABNORMAL HIGH (ref 15–41)
Albumin: 4.1 g/dL (ref 3.5–5.0)
Alkaline Phosphatase: 930 U/L — ABNORMAL HIGH (ref 38–126)
Anion gap: 11 (ref 5–15)
BUN: 30 mg/dL — ABNORMAL HIGH (ref 8–23)
CO2: 28 mmol/L (ref 22–32)
Calcium: 10.2 mg/dL (ref 8.9–10.3)
Chloride: 97 mmol/L — ABNORMAL LOW (ref 98–111)
Creatinine, Ser: 0.6 mg/dL (ref 0.44–1.00)
GFR, Estimated: >60 mL/min (ref >60)
Glucose, Bld: 119 mg/dL — ABNORMAL HIGH (ref 70–99)
Potassium: 3.8 mmol/L (ref 3.5–5.1)
Sodium: 136 mmol/L (ref 135–145)
Total Bilirubin: 0.6 mg/dL (ref 0.3–1.2)
Total Protein: 7.4 g/dL (ref 6.5–8.1)

## 2022-09-04 LAB — CBC WITH DIFFERENTIAL/PLATELET
Abs Immature Granulocytes: 0.12 10*3/uL — ABNORMAL HIGH (ref 0.00–0.07)
Basophils Absolute: 0 10*3/uL (ref 0.0–0.1)
Basophils Relative: 0 %
Eosinophils Absolute: 0 10*3/uL (ref 0.0–0.5)
Eosinophils Relative: 0 %
HCT: 50.3 % — ABNORMAL HIGH (ref 36.0–46.0)
Hemoglobin: 16.6 g/dL — ABNORMAL HIGH (ref 12.0–15.0)
Immature Granulocytes: 1 %
Lymphocytes Relative: 9 %
Lymphs Abs: 0.8 10*3/uL (ref 0.7–4.0)
MCH: 31.7 pg (ref 26.0–34.0)
MCHC: 33 g/dL (ref 30.0–36.0)
MCV: 96 fL (ref 80.0–100.0)
Monocytes Absolute: 0.7 10*3/uL (ref 0.1–1.0)
Monocytes Relative: 8 %
Neutro Abs: 7.2 10*3/uL (ref 1.7–7.7)
Neutrophils Relative %: 82 %
Platelets: 268 10*3/uL (ref 150–400)
RBC: 5.24 MIL/uL — ABNORMAL HIGH (ref 3.87–5.11)
RDW: 13.2 % (ref 11.5–15.5)
WBC: 8.8 10*3/uL (ref 4.0–10.5)
nRBC: 0 % (ref 0.0–0.2)

## 2022-09-04 LAB — GAMMA GT: GGT: 359 U/L — ABNORMAL HIGH (ref 7–50)

## 2022-09-04 LAB — HIV ANTIBODY (ROUTINE TESTING W REFLEX): HIV Screen 4th Generation wRfx: NONREACTIVE

## 2022-09-04 MED ORDER — ACETAMINOPHEN 325 MG PO TABS: 650.0000 mg | ORAL_TABLET | Freq: Four times a day (QID) | ORAL | Status: DC | PRN

## 2022-09-04 MED ORDER — ACETAMINOPHEN 650 MG RE SUPP: 650.0000 mg | Freq: Four times a day (QID) | RECTAL | Status: DC | PRN

## 2022-09-04 MED ORDER — SODIUM CHLORIDE 0.9 % IV SOLN: INTRAVENOUS | Status: DC

## 2022-09-04 MED ORDER — HYDROCODONE-ACETAMINOPHEN 5-325 MG PO TABS
1.0000 | ORAL_TABLET | ORAL | Status: DC | PRN
  Administered 2022-09-04 – 2022-09-05 (×4): 2 via ORAL
  Filled 2022-09-04 (×4): qty 2

## 2022-09-04 NOTE — H&P (Signed)
History and Physical    Patient: Martha [name] FYB:[id number] DOB: 1961/06/15 DOA: 09/04/2022 DOS: the patient was seen and examined on 09/04/2022 PCP: [name], MD  Patient coming from: Home  Chief Complaint:  Chief Complaint  Patient presents with   Weakness   HPI: Martha [name] is a 62 y.o. female with medical history significant of breast cancer w/ mets to spine, hypothyroidism. Presenting with BLE weakness. She reports that she has generally been weak for some time now, but has been able to ambulate with a walker. Over the last 2 days, she has decreased sensation from her waist down. She has difficulty coordinating her walking. She has become progressively weaker and is now using a wheelchair mostly. Her change seemed abrupt, so she spoke with neurosurgery. They recommended that she come to the ED for evaluation.   Review of Systems: As mentioned in the history of present illness. All other systems reviewed and are negative. Past Medical History:  Diagnosis Date   Anxiety    Cancer ([place]) 2022   right breast LCIS   Cancer ([place]) 2019   left breast   Depression    Dysrhythmia    SVT, s/p ablation ~ 2012 in at [facility]   Ectopic pregnancy    Family history of breast cancer    Family history of melanoma    History of radiation therapy 04/22/18-06/03/18   Left Breast, left SCV, axilla 50 Gy in 25 fractions, Left breast boost 10 Gy in 5 fractions.    Hypothyroidism    Personal history of radiation therapy    PONV (postoperative nausea and vomiting)    Thyroid disease    Past Surgical History:  Procedure Laterality Date   APPENDECTOMY     BILATERAL SALPINGECTOMY     BREAST BIOPSY Right 2014   fibroadenoma   BREAST BIOPSY Left 01/16/2018   BREAST BIOPSY Right 02/02/2020   LCIS   BREAST BIOPSY Right 08/04/2020   x2 LCIS   BREAST BIOPSY Right 08/13/2020   x2   BREAST EXCISIONAL BIOPSY Left 1990   benign   BREAST EXCISIONAL BIOPSY Right  02/02/2020   LCIS   BREAST LUMPECTOMY Left 01/22/2018   BREAST LUMPECTOMY WITH AXILLARY LYMPH NODE BIOPSY Left 02/21/2018   Procedure: LEFT BREAST LUMPECTOMY WITH AXILLARY LYMPH NODE BIOPSY;  Surgeon: [name], MD;  Location: [place];  Service: General;  Laterality: Left;   BREAST LUMPECTOMY WITH RADIOACTIVE SEED LOCALIZATION Right 03/18/2020   Procedure: RIGHT BREAST LUMPECTOMY WITH RADIOACTIVE SEED LOCALIZATION;  Surgeon: [name], MD;  Location: [place];  Service: General;  Laterality: Right;   BREAST SURGERY Left 1993   cyst removed   ENDOMETRIAL ABLATION     EYE SURGERY     GLAUCOMA SURGERY Bilateral    POSTERIOR CERVICAL FUSION/FORAMINOTOMY N/A 08/17/2022   Procedure: Cervical One-Cervical Four POSTERIOR CERVICAL FUSION,  Cervical One LAMINECTOMY REDUCTION OF Cervical Two Fracture, Biopsy of Right Cerical Two Pars  Lesion;  Surgeon: Vallarie Mare, MD;  Location: [place];  Service: Neurosurgery;  Laterality: N/A;   RADIOACTIVE SEED GUIDED EXCISIONAL BREAST BIOPSY Right 04/28/2021   Procedure: RADIOACTIVE SEED GUIDED EXCISIONAL RIGHT BREAST BIOPSY X2;  Surgeon: [name], MD;  Location: [place];  Service: General;  Laterality: Right;   RE-EXCISION OF BREAST LUMPECTOMY Left 03/20/2018   Procedure: RE-EXCISION OF BREAST LUMPECTOMY;  Surgeon: [name], MD;  Location: [place];  Service: General;  Laterality: Left;   Social History:  reports that she has been smoking cigarettes. She has a 20.00 pack-year smoking history. She has never used smokeless tobacco. She reports that she does not currently use alcohol after a past usage of about 28.0 standard drinks of alcohol per week. She reports that she does not use drugs.  Allergies  Allergen Reactions   Morphine And Related     Makes her angry    Family History  Problem Relation Age of Onset   Heart disease Mother    Pneumonia Father    Hyperlipidemia Sister    Melanoma Sister 78        hx of 5 melanomas   Lung cancer Maternal Uncle        mesothelioma   Pneumonia Maternal Uncle    Dementia Paternal Grandmother    Lung disease Paternal Grandfather    Breast cancer Other        MGMs sister dx < 42   Breast cancer Other        PGFs sister dx < 30   Breast cancer Cousin        mother's mat first cousin, dx < 89    Prior to Admission medications   Medication Sig Start Date End Date Taking? Authorizing Provider  citalopram (CELEXA) 20 MG tablet Take 1 tablet (20 mg total) by mouth daily. 08/11/22   [name], MD  cyclobenzaprine (FLEXERIL) 10 MG tablet Take 1 tablet (10 mg total) by mouth 3 (three) times daily as needed for muscle spasms. 08/19/22   Viona Gilmore D, NP  gabapentin (NEURONTIN) 100 MG capsule Take 1 capsule (100 mg total) by mouth 3 (three) times daily for 15 days. 08/26/22 09/10/22  Deliah Boston, PA-C  levothyroxine (SYNTHROID) 75 MCG tablet Take 1 tablet (75 mcg total) by mouth daily before breakfast. 08/11/22   [name], MD  meloxicam (MOBIC) 15 MG tablet Take 1 tablet (15 mg total) by mouth daily. Patient not taking: Reported on 08/30/2022 07/18/22   Glennon Mac, DO  OVER THE COUNTER MEDICATION Take 1 tablet by mouth daily. Protandim Supplement    [provider]  oxyCODONE-acetaminophen (PERCOCET) 5-325 MG tablet Take 1-2 tablets by mouth every 4 (four) hours as needed for severe pain (postoperative pain). 08/19/22 08/19/23  Viona Gilmore D, NP  polyethylene glycol (MIRALAX / GLYCOLAX) 17 g packet Take 17 g by mouth daily as needed for mild constipation. Patient not taking: Reported on 08/30/2022 08/19/22   Viona Gilmore D, NP  predniSONE (STERAPRED UNI-PAK 21 TAB) 10 MG (21) TBPK tablet Take by mouth daily. Take 6 tabs by mouth daily  for 2 days, then 5 tabs for 2 days, then 4 tabs for 2 days, then 3 tabs for 2 days, 2 tabs for 2 days, then 1 tab by mouth daily for 2 days 08/26/22   Nuala Alpha A, PA-C  Probiotic  Product (PROBIOTIC PO) Take 1 capsule by mouth daily.    [provider]    Physical Exam: Vitals:   09/04/22 1030 09/04/22 1033 09/04/22 1038 09/04/22 1145  BP: 130/81   (!) 140/87  Pulse: (!) 102  95 94  Resp:    15  Temp: 98.5 F (36.9 C)     TempSrc: Oral     SpO2: 100%  99% 96%  Weight:  44 kg    Height:  '5\' 3"'$  (1.6 m)     General: 62 y.o. female resting in bed in NAD Eyes: PERRL, normal sclera ENMT: Nares patent w/o  discharge, orophaynx clear, dentition normal, ears w/o discharge/lesions/ulcers Neck: in c-collar Cardiovascular: RRR, +S1, S2, no m/g/r, equal pulses throughout Respiratory: CTABL, no w/r/r, normal WOB GI: BS+, NDNT, no masses noted, no organomegaly noted MSK: No e/c/c Skin: No rashes, bruises, ulcerations noted Neuro: A&O x 3, decreased sensation BLE worse on right, MSK str 5/5 BLE/BUE but slightly weaker on right Psyc: Appropriate interaction and affect, calm/cooperative  Data Reviewed:  Results for orders placed or performed during the hospital encounter of 09/04/22 (from the past 24 hour(s))  CBC with Differential     Status: Abnormal   Collection Time: 09/04/22 10:55 AM  Result Value Ref Range   WBC 8.8 4.0 - 10.5 K/uL   RBC 5.24 (H) 3.87 - 5.11 MIL/uL   Hemoglobin 16.6 (H) 12.0 - 15.0 g/dL   HCT 50.3 (H) 36.0 - 46.0 %   MCV 96.0 80.0 - 100.0 fL   MCH 31.7 26.0 - 34.0 pg   MCHC 33.0 30.0 - 36.0 g/dL   RDW 13.2 11.5 - 15.5 %   Platelets 268 150 - 400 K/uL   nRBC 0.0 0.0 - 0.2 %   Neutrophils Relative % 82 %   Neutro Abs 7.2 1.7 - 7.7 K/uL   Lymphocytes Relative 9 %   Lymphs Abs 0.8 0.7 - 4.0 K/uL   Monocytes Relative 8 %   Monocytes Absolute 0.7 0.1 - 1.0 K/uL   Eosinophils Relative 0 %   Eosinophils Absolute 0.0 0.0 - 0.5 K/uL   Basophils Relative 0 %   Basophils Absolute 0.0 0.0 - 0.1 K/uL   Immature Granulocytes 1 %   Abs Immature Granulocytes 0.12 (H) 0.00 - 0.07 K/uL  Comprehensive metabolic panel     Status: Abnormal    Collection Time: 09/04/22 10:55 AM  Result Value Ref Range   Sodium 136 135 - 145 mmol/L   Potassium 3.8 3.5 - 5.1 mmol/L   Chloride 97 (L) 98 - 111 mmol/L   CO2 28 22 - 32 mmol/L   Glucose, Bld 119 (H) 70 - 99 mg/dL   BUN 30 (H) 8 - 23 mg/dL   Creatinine, Ser 0.60 0.44 - 1.00 mg/dL   Calcium 10.2 8.9 - 10.3 mg/dL   Total Protein 7.4 6.5 - 8.1 g/dL   Albumin 4.1 3.5 - 5.0 g/dL   AST 56 (H) 15 - 41 U/L   ALT 44 0 - 44 U/L   Alkaline Phosphatase 930 (H) 38 - 126 U/L   Total Bilirubin 0.6 0.3 - 1.2 mg/dL   GFR, Estimated >60 >60 mL/min   Anion gap 11 5 - 15   Assessment and Plan: Paraesthesia Hx of Breast CA w/ spinal mets Weakness     - admit to inpt, med-surg     - neurosurgery consulted by EDP; will follow, rec'd MRIs; ordered by EDP     - rad-onc consulted by EDP; appreciate assistance  Hypothyroidism      - resume home regimen  Elevated Alk phos     - likely from CA to bone; check ggt  Advance Care Planning:   Code Status: DNR, confirmed through multiple lines of questioning  Consults: Neurosurgery, Rad-Onc  Family Communication: w/ husband at bedside  Severity of Illness: The appropriate patient status for this patient is OBSERVATION. Observation status is judged to be reasonable and necessary in order to provide the required intensity of service to ensure the patient's safety. The patient's presenting symptoms, physical exam findings, and initial radiographic and laboratory data  in the context of their medical condition is felt to place them at decreased risk for further clinical deterioration. Furthermore, it is anticipated that the patient will be medically stable for discharge from the hospital within 2 midnights of admission.   Author: Jonnie Finner, DO 09/04/2022 12:16 PM  For on call review [email or web address].

## 2022-09-04 NOTE — ED Provider Notes (Signed)
[place] DEPT Provider Note   CSN: [id number] Arrival date & time: 09/04/22  1018     History  Chief Complaint  Patient presents with   Weakness    Martha [name] is a 62 y.o. female.  62 year old female with prior medical history detailed below presents for evaluation.  Patient with known metastatic breast cancer with lesions to the brain and spine.  Patient by Dr. Marcello Moores with [facility] neurosurgery.  Patient reports increasing lower extremity weakness x 4 days.  Patient reports that last week she was able to ambulate with a walker.  Since Friday she has been unable to ambulate secondary to weakness of both lower extremities.  Patient sent to the ED for admission by Dr. Marcello Moores.  Patient is scheduled for radiation therapy to her spine early next week.  Per patient, patient may require repeat neuroimaging and possible radiation therapy sooner rather than later.    The history is provided by the patient and medical records.       Home Medications Prior to Admission medications   Medication Sig Start Date End Date Taking? Authorizing Provider  citalopram (CELEXA) 20 MG tablet Take 1 tablet (20 mg total) by mouth daily. 08/11/22   [name], MD  cyclobenzaprine (FLEXERIL) 10 MG tablet Take 1 tablet (10 mg total) by mouth 3 (three) times daily as needed for muscle spasms. 08/19/22   Viona Gilmore D, NP  gabapentin (NEURONTIN) 100 MG capsule Take 1 capsule (100 mg total) by mouth 3 (three) times daily for 15 days. 08/26/22 09/10/22  Deliah Boston, PA-C  levothyroxine (SYNTHROID) 75 MCG tablet Take 1 tablet (75 mcg total) by mouth daily before breakfast. 08/11/22   [name], MD  meloxicam (MOBIC) 15 MG tablet Take 1 tablet (15 mg total) by mouth daily. Patient not taking: Reported on 08/30/2022 07/18/22   Glennon Mac, DO  OVER THE COUNTER MEDICATION Take 1 tablet by mouth daily. Protandim Supplement    [provider]  oxyCODONE-acetaminophen (PERCOCET) 5-325 MG tablet Take 1-2 tablets by mouth every 4 (four) hours as needed for severe pain (postoperative pain). 08/19/22 08/19/23  Viona Gilmore D, NP  polyethylene glycol (MIRALAX / GLYCOLAX) 17 g packet Take 17 g by mouth daily as needed for mild constipation. Patient not taking: Reported on 08/30/2022 08/19/22   Viona Gilmore D, NP  predniSONE (STERAPRED UNI-PAK 21 TAB) 10 MG (21) TBPK tablet Take by mouth daily. Take 6 tabs by mouth daily  for 2 days, then 5 tabs for 2 days, then 4 tabs for 2 days, then 3 tabs for 2 days, 2 tabs for 2 days, then 1 tab by mouth daily for 2 days 08/26/22   Nuala Alpha A, PA-C  Probiotic Product (PROBIOTIC PO) Take 1 capsule by mouth daily.    [provider]      Allergies    Morphine and related    Review of Systems   Review of Systems  All other systems reviewed and are negative.   Physical Exam Updated Vital Signs BP (!) 140/87   Pulse 94   Temp 98.5 F (36.9 C) (Oral)   Resp 15   Ht '5\' 3"'$  (1.6 m)   Wt 44 kg   SpO2 96%   BMI 17.18 kg/m  Physical Exam Vitals and nursing note reviewed.  Constitutional:      General: She is not in acute distress.    Appearance: Normal appearance. She is well-developed.  HENT:  Head: Normocephalic and atraumatic.  Eyes:     Conjunctiva/sclera: Conjunctivae normal.     Pupils: Pupils are equal, round, and reactive to light.  Cardiovascular:     Rate and Rhythm: Normal rate and regular rhythm.     Heart sounds: Normal heart sounds.  Pulmonary:     Effort: Pulmonary effort is normal. No respiratory distress.     Breath sounds: Normal breath sounds.  Abdominal:     General: There is no distension.     Palpations: Abdomen is soft.     Tenderness: There is no abdominal tenderness.  Musculoskeletal:        General: No deformity. Normal range of motion.     Cervical back: Normal range of motion and neck supple.  Skin:    General: Skin is warm and  dry.  Neurological:     General: No focal deficit present.     Mental Status: She is alert and oriented to person, place, and time.     Comments: Alert, oriented x 3, 5 out of 5 strength in both upper extremities.  4 out of 5 strength of both lower extremities.  Patient is able to lift her legs against gravity however, she cannot ambulate given overall lower extremity weakness.  She reports decreased sensation to light touch to bilateral lower extremities as well.     ED Results / Procedures / Treatments   Labs (all labs ordered are listed, but only abnormal results are displayed) Labs Reviewed  CBC WITH DIFFERENTIAL/PLATELET - Abnormal; Notable for the following components:      Result Value   RBC 5.24 (*)    Hemoglobin 16.6 (*)    HCT 50.3 (*)    Abs Immature Granulocytes 0.12 (*)    All other components within normal limits  COMPREHENSIVE METABOLIC PANEL - Abnormal; Notable for the following components:   Chloride 97 (*)    Glucose, Bld 119 (*)    BUN 30 (*)    AST 56 (*)    Alkaline Phosphatase 930 (*)    All other components within normal limits  URINALYSIS, ROUTINE W REFLEX MICROSCOPIC    EKG EKG Interpretation  Date/Time:  Monday September 04 2022 10:41:48 EST Ventricular Rate:  97 PR Interval:  108 QRS Duration: 74 QT Interval:  339 QTC Calculation: 431 R Axis:   95 Text Interpretation: Sinus rhythm Atrial premature complex Short PR interval Biatrial enlargement Right axis deviation Baseline wander in lead(s) V1 Confirmed by Dene Gentry [phone]) on 09/04/2022 10:45:17 AM  Radiology No results found.  Procedures Procedures    Medications Ordered in ED Medications - No data to display  ED Course/ Medical Decision Making/ A&P                           Medical Decision Making Amount and/or Complexity of Data Reviewed Labs: ordered. Radiology: ordered.  Risk Decision regarding hospitalization.    Medical Screen Complete  This patient presented to  the ED with complaint of weakness.  This complaint involves an extensive number of treatment options. The initial differential diagnosis includes, but is not limited to, progression of metastatic disease, metabolic abnormality, etc.  This presentation is: Acute, Chronic, Self-Limited, Previously Undiagnosed, Uncertain Prognosis, Complicated, Systemic Symptoms, and Threat to Life/Bodily Function  Patient with known metastatic breast cancer with multiple mets to spine and brain.  Patient is scheduled for initiation of radiation therapy to the spine next week.  Over the last  several days patient has noted increased weakness to both lower extremities most likely secondary to metastatic process in spine.  Patient known to Dr. Marcello Moores with neurosurgery.  Case discussed with Dr. Marcello Moores today.  Patient will require admission and repeat neuroimaging of the C,T, and L-spine.  Unfortunately, today MRI is not available at [facility].  Patient will require admission here and then MRI imaging with plan for radiation oncology to hopefully initiate radiation therapy sooner rather than later.  Patient admitted to hospitalist service with MRI imaging pending - which will have to be performed at [facility].  Additional history obtained:  Additional history obtained from Spouse External records from outside sources obtained and reviewed including prior ED visits and prior Inpatient records.    Lab Tests:  I ordered and personally interpreted labs.  The pertinent results include: CBC, CMP, UA   Imaging Studies ordered:  I ordered imaging studies including MRI C, T, L  Problem List / ED Course:  Weakness   Reevaluation:  After the interventions noted above, I reevaluated the patient and found that they have: improved Disposition:  After consideration of the diagnostic results and the patients response to treatment, I feel that the patent would benefit from admission.          Final  Clinical Impression(s) / ED Diagnoses Final diagnoses:  Weakness    Rx / DC Orders ED Discharge Orders     None         Valarie Merino, MD 09/04/22 1456

## 2022-09-04 NOTE — ED Notes (Signed)
Pt attempted to get urine sample, pt unable to go at this time.

## 2022-09-04 NOTE — ED Triage Notes (Signed)
Pt coming from home via EMS with c/o increased generalized weakness and numbness in hands and feet x1 week. Patient has not been able to walk the last few days. Hx breast cancer with mets to spine and brain.

## 2022-09-05 ENCOUNTER — Other Ambulatory Visit: Payer: Self-pay | Admitting: Neurosurgery

## 2022-09-05 ENCOUNTER — Ambulatory Visit (HOSPITAL_COMMUNITY)
Admit: 2022-09-05 | Discharge: 2022-09-05 | Disposition: A | Discharge status: Home / Self Care | Payer: BC Managed Care – PPO | Attending: Internal Medicine | Admitting: Internal Medicine

## 2022-09-05 ENCOUNTER — Ambulatory Visit: Payer: BC Managed Care – PPO | Admitting: Radiation Oncology

## 2022-09-05 ENCOUNTER — Encounter (HOSPITAL_COMMUNITY): Payer: Self-pay

## 2022-09-05 DIAGNOSIS — C7951 Secondary malignant neoplasm of bone: Secondary | ICD-10-CM | POA: Diagnosis not present

## 2022-09-05 DIAGNOSIS — M47816 Spondylosis without myelopathy or radiculopathy, lumbar region: Secondary | ICD-10-CM | POA: Diagnosis not present

## 2022-09-05 DIAGNOSIS — R748 Abnormal levels of other serum enzymes: Secondary | ICD-10-CM

## 2022-09-05 DIAGNOSIS — C50919 Malignant neoplasm of unspecified site of unspecified female breast: Secondary | ICD-10-CM | POA: Diagnosis not present

## 2022-09-05 DIAGNOSIS — R202 Paresthesia of skin: Secondary | ICD-10-CM | POA: Diagnosis not present

## 2022-09-05 DIAGNOSIS — E039 Hypothyroidism, unspecified: Secondary | ICD-10-CM

## 2022-09-05 DIAGNOSIS — M47814 Spondylosis without myelopathy or radiculopathy, thoracic region: Secondary | ICD-10-CM | POA: Diagnosis not present

## 2022-09-05 DIAGNOSIS — M50223 Other cervical disc displacement at C6-C7 level: Secondary | ICD-10-CM | POA: Diagnosis not present

## 2022-09-05 LAB — CBC
HCT: 43.4 % (ref 36.0–46.0)
Hemoglobin: 14.1 g/dL (ref 12.0–15.0)
MCH: 31.5 pg (ref 26.0–34.0)
MCHC: 32.5 g/dL (ref 30.0–36.0)
MCV: 96.9 fL (ref 80.0–100.0)
Platelets: 227 10*3/uL (ref 150–400)
RBC: 4.48 MIL/uL (ref 3.87–5.11)
RDW: 13.2 % (ref 11.5–15.5)
WBC: 6.2 10*3/uL (ref 4.0–10.5)
nRBC: 0 % (ref 0.0–0.2)

## 2022-09-05 LAB — COMPREHENSIVE METABOLIC PANEL
ALT: 36 U/L (ref 0–44)
AST: 43 U/L — ABNORMAL HIGH (ref 15–41)
Albumin: 3 g/dL — ABNORMAL LOW (ref 3.5–5.0)
Alkaline Phosphatase: 721 U/L — ABNORMAL HIGH (ref 38–126)
Anion gap: 9 (ref 5–15)
BUN: 20 mg/dL (ref 8–23)
CO2: 24 mmol/L (ref 22–32)
Calcium: 9.2 mg/dL (ref 8.9–10.3)
Chloride: 101 mmol/L (ref 98–111)
Creatinine, Ser: 0.45 mg/dL (ref 0.44–1.00)
GFR, Estimated: >60 mL/min (ref >60)
Glucose, Bld: 94 mg/dL (ref 70–99)
Potassium: 3.7 mmol/L (ref 3.5–5.1)
Sodium: 134 mmol/L — ABNORMAL LOW (ref 135–145)
Total Bilirubin: 0.5 mg/dL (ref 0.3–1.2)
Total Protein: 5.7 g/dL — ABNORMAL LOW (ref 6.5–8.1)

## 2022-09-05 MED ORDER — LEVOTHYROXINE SODIUM 75 MCG PO TABS
75.0000 ug | ORAL_TABLET | Freq: Every day | ORAL | Status: DC
  Administered 2022-09-06 – 2022-09-13 (×8): 75 ug via ORAL
  Filled 2022-09-05 (×8): qty 1

## 2022-09-05 MED ORDER — CYCLOBENZAPRINE HCL 10 MG PO TABS
10.0000 mg | ORAL_TABLET | Freq: Three times a day (TID) | ORAL | Status: DC | PRN
  Administered 2022-09-05 – 2022-09-06 (×2): 10 mg via ORAL
  Filled 2022-09-05 (×2): qty 1

## 2022-09-05 MED ORDER — LETROZOLE 2.5 MG PO TABS
2.5000 mg | ORAL_TABLET | Freq: Every day | ORAL | Status: DC
  Administered 2022-09-07 – 2022-09-13 (×7): 2.5 mg via ORAL
  Filled 2022-09-05 (×8): qty 1

## 2022-09-05 MED ORDER — OXYCODONE HCL 5 MG PO TABS
5.0000 mg | ORAL_TABLET | ORAL | Status: DC | PRN
  Administered 2022-09-05 – 2022-09-06 (×6): 5 mg via ORAL
  Filled 2022-09-05 (×6): qty 1

## 2022-09-05 MED ORDER — CEFAZOLIN SODIUM-DEXTROSE 2-4 GM/100ML-% IV SOLN
2.0000 g | INTRAVENOUS | Status: AC
  Administered 2022-09-06: 2 g via INTRAVENOUS
  Filled 2022-09-05: qty 100

## 2022-09-05 MED ORDER — SENNOSIDES-DOCUSATE SODIUM 8.6-50 MG PO TABS
1.0000 | ORAL_TABLET | Freq: Every day | ORAL | Status: DC
  Administered 2022-09-05 – 2022-09-12 (×8): 1 via ORAL
  Filled 2022-09-05 (×8): qty 1

## 2022-09-05 MED ORDER — CITALOPRAM HYDROBROMIDE 20 MG PO TABS
20.0000 mg | ORAL_TABLET | Freq: Every day | ORAL | Status: DC
  Administered 2022-09-05 – 2022-09-13 (×8): 20 mg via ORAL
  Filled 2022-09-05 (×9): qty 1

## 2022-09-05 MED ORDER — DEXAMETHASONE SODIUM PHOSPHATE 4 MG/ML IJ SOLN
4.0000 mg | Freq: Four times a day (QID) | INTRAMUSCULAR | Status: DC
  Administered 2022-09-05 – 2022-09-06 (×5): 4 mg via INTRAVENOUS
  Filled 2022-09-05 (×5): qty 1

## 2022-09-05 MED ORDER — GADOBUTROL 1 MMOL/ML IV SOLN
4.5000 mL | Freq: Once | INTRAVENOUS | Status: AC | PRN
  Administered 2022-09-05: 4.5 mL via INTRAVENOUS

## 2022-09-05 MED ORDER — ACETAMINOPHEN 325 MG PO TABS
650.0000 mg | ORAL_TABLET | Freq: Four times a day (QID) | ORAL | Status: DC | PRN
  Administered 2022-09-05 (×2): 650 mg via ORAL
  Filled 2022-09-05 (×3): qty 2

## 2022-09-05 MED ORDER — ACETAMINOPHEN 500 MG PO TABS: 1000.0000 mg | ORAL_TABLET | Freq: Three times a day (TID) | ORAL | Status: DC

## 2022-09-05 NOTE — Assessment & Plan Note (Signed)
GGT elevated, probably this is a combination of osseous met disease and progression of liver mets.

## 2022-09-05 NOTE — Assessment & Plan Note (Signed)
See above

## 2022-09-05 NOTE — Assessment & Plan Note (Signed)
Continue levothyroxine 

## 2022-09-05 NOTE — Plan of Care (Signed)
  Problem: Education: Goal: Ability to verbalize activity precautions or restrictions will improve Outcome: Progressing   Problem: Education: Goal: Knowledge of the prescribed therapeutic regimen will improve Outcome: Progressing   Problem: Pain Management: Goal: Pain level will decrease Outcome: Progressing   Problem: Education: Goal: Knowledge of General Education information will improve Description: Including pain rating scale, medication(s)/side effects and non-pharmacologic comfort measures Outcome: Progressing   Problem: Nutrition: Goal: Adequate nutrition will be maintained Outcome: Progressing   Problem: Coping: Goal: Level of anxiety will decrease Outcome: Progressing   Problem: Pain Managment: Goal: General experience of comfort will improve Outcome: Progressing   Problem: Safety: Goal: Ability to remain free from injury will improve Outcome: Progressing

## 2022-09-05 NOTE — Hospital Course (Addendum)
Martha [name] is a 62 y.o. F with metastatic Br CA with mets to spine, and hypothyroidism who presented with BLE weakness and paresthesias over the last few days.   1/8: Admitted, Neurosurgery consulted, recommended MRI 1/9: MRI shows cervical stenosis, Rad Onc and Med Onc consulted

## 2022-09-05 NOTE — Assessment & Plan Note (Signed)
MRI C, T, and L-spine obtained today shows stable widespread vertebral metastases.   In the cervical spine, the spinal stenosis at the C5 and C6 levels appears mildly progressive. - Start dexamethasone - Stop prednisone - Consult Neurosurgery and Radiation Oncology and Medical Oncology, appreciate cares

## 2022-09-05 NOTE — Assessment & Plan Note (Addendum)
MRI spine shows worsening cervical stenosis as above, also progression of hepatic metastatic disease. - Appreciate expertise and care coordination by Rad Onc, Med Onc and Neurosurgery - Continue Celexa

## 2022-09-05 NOTE — Progress Notes (Signed)
  Progress Note   Patient: Martha [name] XIP:[id number] DOB: 1961-04-25 DOA: 09/04/2022     1 DOS: the patient was seen and examined on 09/05/2022 at 11:55AM      Brief hospital course: Mrs. Trostle is a 62 y.o. F with metastatic Br CA with mets to spine, and hypothyroidism who presented with BLE weakness and paresthesias over the last few days.   1/8: Admitted, Neurosurgery consulted, recommended MRI 1/9: MRI shows cervical stenosis, Rad Onc and Med Onc consulted     Assessment and Plan: Paresthesia and weakness of both lower extremities MRI C, T, and L-spine obtained today shows stable widespread vertebral metastases.   In the cervical spine, the spinal stenosis at the C5 and C6 levels appears mildly progressive. - Start dexamethasone - Stop prednisone - Consult Neurosurgery and Radiation Oncology and Medical Oncology, appreciate cares    Elevated alkaline phosphatase level GGT elevated, probably this is a combination of osseous met disease and progression of liver mets.  Cancer, metastatic to bone [facility]) See above  Malignant neoplasm of lower-inner quadrant of left breast in female, estrogen receptor positive ([place]) MRI spine shows worsening cervical stenosis as above, also progression of hepatic metastatic disease. - Appreciate expertise and care coordination by Rad Onc, Med Onc and Neurosurgery - Continue Celexa  Hypothyroidism - Continue levothyroxine          Subjective: Patient has unchanged leg weakness and numbness.  She has no confusion, respiratory distress, malaise.     Physical Exam: BP 136/84 (BP Location: Right Arm)   Pulse 88   Temp 98.1 F (36.7 C) (Oral)   Resp 20   Ht '5\' 3"'$  (1.6 m)   Wt 44 kg   SpO2 97%   BMI 17.18 kg/m   Elderly adult female, cervical collar in place, appears weak and tired RRR, no murmurs, no peripheral edema Respiratory rate normal, lungs clear without rales or wheezes Attention normal, affect appropriate,  face symmetric, speech fluent, bilateral upper extremity sensation seems intact to light touch, strength seems slightly diminished but symmetric, bilateral lower extremity numbness is noted to light touch, also symmetric weakness.    Data Reviewed: Discussed with radiation oncology and medical oncology Alk phos elevated Creatinine, potassium, and sodium normal CBC normal    Family Communication: husband by phone    Disposition: Status is: Inpatient Patient with progressive weakness, now unable to stand. We will change steroids and Neurosurgery and Radiation Oncology will provide treatment options.  If no surgery is needed, we will start to explore discharge to rehab tomorrow or the next day, pending radiation plans.        Author: Edwin Dada, MD 09/05/2022 2:41 PM  For on call review [email or web address].

## 2022-09-06 ENCOUNTER — Encounter (HOSPITAL_COMMUNITY): Payer: Self-pay | Admitting: Internal Medicine

## 2022-09-06 ENCOUNTER — Other Ambulatory Visit: Payer: Self-pay

## 2022-09-06 ENCOUNTER — Inpatient Hospital Stay (HOSPITAL_COMMUNITY): Payer: BC Managed Care – PPO

## 2022-09-06 ENCOUNTER — Inpatient Hospital Stay (HOSPITAL_COMMUNITY): Payer: BC Managed Care – PPO | Admitting: Anesthesiology

## 2022-09-06 ENCOUNTER — Ambulatory Visit: Payer: BC Managed Care – PPO | Admitting: Radiation Oncology

## 2022-09-06 ENCOUNTER — Encounter (HOSPITAL_COMMUNITY)
Admission: EM | Disposition: A | Discharge status: Other Acute Inpatient Hospital | Payer: Self-pay | Source: Home / Self Care | Attending: Neurosurgery

## 2022-09-06 HISTORY — PX: POSTERIOR CERVICAL FUSION/FORAMINOTOMY: SHX5038

## 2022-09-06 LAB — TYPE AND SCREEN
ABO/RH(D): O NEG
Antibody Screen: NEGATIVE

## 2022-09-06 LAB — SURGICAL PCR SCREEN
MRSA, PCR: NEGATIVE
Staphylococcus aureus: NEGATIVE

## 2022-09-06 LAB — SURGICAL PATHOLOGY

## 2022-09-06 SURGERY — POSTERIOR CERVICAL FUSION/FORAMINOTOMY LEVEL 2
Anesthesia: General

## 2022-09-06 MED ORDER — BISACODYL 10 MG RE SUPP
10.0000 mg | Freq: Once | RECTAL | Status: AC
  Administered 2022-09-06: 10 mg via RECTAL
  Filled 2022-09-06: qty 1

## 2022-09-06 MED ORDER — LIDOCAINE 2% (20 MG/ML) 5 ML SYRINGE
INTRAMUSCULAR | Status: DC | PRN
  Administered 2022-09-06: 40 mg via INTRAVENOUS

## 2022-09-06 MED ORDER — FENTANYL CITRATE (PF) 100 MCG/2ML IJ SOLN
INTRAMUSCULAR | Status: AC
  Filled 2022-09-06: qty 2

## 2022-09-06 MED ORDER — ONDANSETRON HCL 4 MG PO TABS: 4.0000 mg | ORAL_TABLET | Freq: Four times a day (QID) | ORAL | Status: DC | PRN

## 2022-09-06 MED ORDER — SODIUM CHLORIDE 0.9% FLUSH: 3.0000 mL | INTRAVENOUS | Status: DC | PRN

## 2022-09-06 MED ORDER — FENTANYL CITRATE (PF) 250 MCG/5ML IJ SOLN
INTRAMUSCULAR | Status: DC | PRN
  Administered 2022-09-06: 50 ug via INTRAVENOUS
  Administered 2022-09-06 (×2): 100 ug via INTRAVENOUS

## 2022-09-06 MED ORDER — MIDAZOLAM HCL 2 MG/2ML IJ SOLN
INTRAMUSCULAR | Status: DC | PRN
  Administered 2022-09-06: 2 mg via INTRAVENOUS

## 2022-09-06 MED ORDER — HYDROMORPHONE HCL 1 MG/ML IJ SOLN
0.5000 mg | INTRAMUSCULAR | Status: DC | PRN
  Administered 2022-09-06 – 2022-09-09 (×8): 0.5 mg via INTRAVENOUS
  Filled 2022-09-06 (×8): qty 0.5

## 2022-09-06 MED ORDER — CHLORHEXIDINE GLUCONATE 0.12 % MT SOLN
OROMUCOSAL | Status: AC
  Administered 2022-09-06: 15 mL via OROMUCOSAL
  Filled 2022-09-06: qty 15

## 2022-09-06 MED ORDER — CYCLOBENZAPRINE HCL 10 MG PO TABS: 10.0000 mg | ORAL_TABLET | Freq: Three times a day (TID) | ORAL | Status: DC | PRN

## 2022-09-06 MED ORDER — OXYCODONE HCL 5 MG PO TABS
5.0000 mg | ORAL_TABLET | ORAL | Status: DC | PRN
  Administered 2022-09-06 – 2022-09-07 (×2): 5 mg via ORAL
  Filled 2022-09-06 (×3): qty 1

## 2022-09-06 MED ORDER — MENTHOL 3 MG MT LOZG: 1.0000 | LOZENGE | OROMUCOSAL | Status: DC | PRN

## 2022-09-06 MED ORDER — FENTANYL CITRATE (PF) 250 MCG/5ML IJ SOLN
INTRAMUSCULAR | Status: AC
  Filled 2022-09-06: qty 5

## 2022-09-06 MED ORDER — ONDANSETRON HCL 4 MG/2ML IJ SOLN
INTRAMUSCULAR | Status: AC
  Filled 2022-09-06: qty 2

## 2022-09-06 MED ORDER — ROCURONIUM BROMIDE 10 MG/ML (PF) SYRINGE
PREFILLED_SYRINGE | INTRAVENOUS | Status: DC | PRN
  Administered 2022-09-06 (×3): 10 mg via INTRAVENOUS
  Administered 2022-09-06: 20 mg via INTRAVENOUS
  Administered 2022-09-06: 50 mg via INTRAVENOUS

## 2022-09-06 MED ORDER — DOCUSATE SODIUM 100 MG PO CAPS
100.0000 mg | ORAL_CAPSULE | Freq: Two times a day (BID) | ORAL | Status: DC
  Administered 2022-09-06 – 2022-09-13 (×14): 100 mg via ORAL
  Filled 2022-09-06 (×14): qty 1

## 2022-09-06 MED ORDER — ARTIFICIAL TEARS OPHTHALMIC OINT
TOPICAL_OINTMENT | OPHTHALMIC | Status: AC
  Filled 2022-09-06: qty 3.5

## 2022-09-06 MED ORDER — LIDOCAINE 2% (20 MG/ML) 5 ML SYRINGE
INTRAMUSCULAR | Status: AC
  Filled 2022-09-06: qty 5

## 2022-09-06 MED ORDER — THROMBIN 5000 UNITS EX SOLR
OROMUCOSAL | Status: DC | PRN
  Administered 2022-09-06: 5 mL via TOPICAL

## 2022-09-06 MED ORDER — BUPIVACAINE LIPOSOME 1.3 % IJ SUSP
INTRAMUSCULAR | Status: DC | PRN
  Administered 2022-09-06: 20 mL

## 2022-09-06 MED ORDER — METHYLPREDNISOLONE ACETATE 80 MG/ML IJ SUSP
INTRAMUSCULAR | Status: AC
  Filled 2022-09-06: qty 1

## 2022-09-06 MED ORDER — GABAPENTIN 100 MG PO CAPS
100.0000 mg | ORAL_CAPSULE | Freq: Three times a day (TID) | ORAL | Status: DC
  Administered 2022-09-06 – 2022-09-13 (×20): 100 mg via ORAL
  Filled 2022-09-06 (×20): qty 1

## 2022-09-06 MED ORDER — ORAL CARE MOUTH RINSE: 15.0000 mL | Freq: Once | OROMUCOSAL | Status: AC

## 2022-09-06 MED ORDER — 0.9 % SODIUM CHLORIDE (POUR BTL) OPTIME
TOPICAL | Status: DC | PRN
  Administered 2022-09-06: 1000 mL

## 2022-09-06 MED ORDER — AMISULPRIDE (ANTIEMETIC) 5 MG/2ML IV SOLN: 10.0000 mg | Freq: Once | INTRAVENOUS | Status: DC | PRN

## 2022-09-06 MED ORDER — BUPIVACAINE LIPOSOME 1.3 % IJ SUSP
INTRAMUSCULAR | Status: AC
  Filled 2022-09-06: qty 20

## 2022-09-06 MED ORDER — PROPOFOL 10 MG/ML IV BOLUS
INTRAVENOUS | Status: DC | PRN
  Administered 2022-09-06: 25 ug/kg/min via INTRAVENOUS
  Administered 2022-09-06: 100 mg via INTRAVENOUS

## 2022-09-06 MED ORDER — SODIUM CHLORIDE 0.9 % IV SOLN: 250.0000 mL | INTRAVENOUS | Status: DC

## 2022-09-06 MED ORDER — PHENYLEPHRINE 80 MCG/ML (10ML) SYRINGE FOR IV PUSH (FOR BLOOD PRESSURE SUPPORT)
PREFILLED_SYRINGE | INTRAVENOUS | Status: AC
  Filled 2022-09-06: qty 10

## 2022-09-06 MED ORDER — THROMBIN 5000 UNITS EX SOLR
CUTANEOUS | Status: AC
  Filled 2022-09-06: qty 5000

## 2022-09-06 MED ORDER — CHLORHEXIDINE GLUCONATE 0.12 % MT SOLN: 15.0000 mL | Freq: Once | OROMUCOSAL | Status: AC

## 2022-09-06 MED ORDER — LIDOCAINE-EPINEPHRINE 1 %-1:100000 IJ SOLN
INTRAMUSCULAR | Status: AC
  Filled 2022-09-06: qty 1

## 2022-09-06 MED ORDER — FENTANYL CITRATE (PF) 100 MCG/2ML IJ SOLN
25.0000 ug | INTRAMUSCULAR | Status: DC | PRN
  Administered 2022-09-06: 25 ug via INTRAVENOUS
  Administered 2022-09-06: 50 ug via INTRAVENOUS
  Administered 2022-09-06: 25 ug via INTRAVENOUS

## 2022-09-06 MED ORDER — BACITRACIN ZINC 500 UNIT/GM EX OINT
TOPICAL_OINTMENT | CUTANEOUS | Status: AC
  Filled 2022-09-06: qty 28.35

## 2022-09-06 MED ORDER — ROCURONIUM BROMIDE 10 MG/ML (PF) SYRINGE
PREFILLED_SYRINGE | INTRAVENOUS | Status: AC
  Filled 2022-09-06: qty 10

## 2022-09-06 MED ORDER — LACTATED RINGERS IV SOLN: INTRAVENOUS | Status: DC | PRN

## 2022-09-06 MED ORDER — VANCOMYCIN HCL 1000 MG IV SOLR
INTRAVENOUS | Status: DC | PRN
  Administered 2022-09-06: 1000 mg

## 2022-09-06 MED ORDER — ACETAMINOPHEN 325 MG PO TABS
650.0000 mg | ORAL_TABLET | ORAL | Status: DC | PRN
  Administered 2022-09-10 – 2022-09-13 (×8): 650 mg via ORAL
  Filled 2022-09-06 (×9): qty 2

## 2022-09-06 MED ORDER — CELECOXIB 200 MG PO CAPS
200.0000 mg | ORAL_CAPSULE | Freq: Once | ORAL | Status: AC
  Administered 2022-09-06: 200 mg via ORAL
  Filled 2022-09-06: qty 1

## 2022-09-06 MED ORDER — SODIUM CHLORIDE 0.9% FLUSH
3.0000 mL | Freq: Two times a day (BID) | INTRAVENOUS | Status: DC
  Administered 2022-09-06 – 2022-09-13 (×12): 3 mL via INTRAVENOUS

## 2022-09-06 MED ORDER — LACTATED RINGERS IV SOLN: INTRAVENOUS | Status: DC

## 2022-09-06 MED ORDER — PHENOL 1.4 % MT LIQD: 1.0000 | OROMUCOSAL | Status: DC | PRN

## 2022-09-06 MED ORDER — ACETAMINOPHEN 650 MG RE SUPP
650.0000 mg | RECTAL | Status: DC | PRN
  Administered 2022-09-13: 650 mg via RECTAL

## 2022-09-06 MED ORDER — ONDANSETRON HCL 4 MG/2ML IJ SOLN
INTRAMUSCULAR | Status: DC | PRN
  Administered 2022-09-06: 4 mg via INTRAVENOUS

## 2022-09-06 MED ORDER — ACETAMINOPHEN 500 MG PO TABS
1000.0000 mg | ORAL_TABLET | Freq: Once | ORAL | Status: AC
  Administered 2022-09-06: 1000 mg via ORAL
  Filled 2022-09-06: qty 2

## 2022-09-06 MED ORDER — ONDANSETRON HCL 4 MG/2ML IJ SOLN: 4.0000 mg | Freq: Four times a day (QID) | INTRAMUSCULAR | Status: DC | PRN

## 2022-09-06 MED ORDER — VANCOMYCIN HCL 1000 MG IV SOLR
INTRAVENOUS | Status: AC
  Filled 2022-09-06: qty 20

## 2022-09-06 MED ORDER — OXYCODONE HCL 5 MG PO TABS
10.0000 mg | ORAL_TABLET | ORAL | Status: DC | PRN
  Administered 2022-09-07 – 2022-09-09 (×7): 10 mg via ORAL
  Filled 2022-09-06 (×7): qty 2

## 2022-09-06 MED ORDER — MUPIROCIN 2 % EX OINT
1.0000 | TOPICAL_OINTMENT | Freq: Two times a day (BID) | CUTANEOUS | Status: AC
  Administered 2022-09-06 – 2022-09-10 (×9): 1 via NASAL
  Filled 2022-09-06 (×5): qty 22

## 2022-09-06 MED ORDER — DEXMEDETOMIDINE HCL IN NACL 80 MCG/20ML IV SOLN
INTRAVENOUS | Status: DC | PRN
  Administered 2022-09-06: 8 ug via BUCCAL

## 2022-09-06 MED ORDER — DEXAMETHASONE SODIUM PHOSPHATE 10 MG/ML IJ SOLN
INTRAMUSCULAR | Status: AC
  Filled 2022-09-06: qty 1

## 2022-09-06 MED ORDER — BUPIVACAINE HCL (PF) 0.5 % IJ SOLN
INTRAMUSCULAR | Status: DC | PRN
  Administered 2022-09-06: 20 mL

## 2022-09-06 MED ORDER — CEFAZOLIN SODIUM-DEXTROSE 1-4 GM/50ML-% IV SOLN
1.0000 g | Freq: Three times a day (TID) | INTRAVENOUS | Status: AC
  Administered 2022-09-06 – 2022-09-07 (×4): 1 g via INTRAVENOUS
  Filled 2022-09-06 (×4): qty 50

## 2022-09-06 MED ORDER — POTASSIUM CHLORIDE IN NACL 20-0.9 MEQ/L-% IV SOLN
INTRAVENOUS | Status: DC
  Filled 2022-09-06 (×8): qty 1000

## 2022-09-06 MED ORDER — BUPIVACAINE HCL (PF) 0.5 % IJ SOLN
INTRAMUSCULAR | Status: AC
  Filled 2022-09-06: qty 30

## 2022-09-06 MED ORDER — PHENYLEPHRINE 80 MCG/ML (10ML) SYRINGE FOR IV PUSH (FOR BLOOD PRESSURE SUPPORT)
PREFILLED_SYRINGE | INTRAVENOUS | Status: DC | PRN
  Administered 2022-09-06: 80 ug via INTRAVENOUS
  Administered 2022-09-06: 160 ug via INTRAVENOUS
  Administered 2022-09-06 (×3): 80 ug via INTRAVENOUS
  Administered 2022-09-06: 160 ug via INTRAVENOUS
  Administered 2022-09-06: 80 ug via INTRAVENOUS
  Administered 2022-09-06: 160 ug via INTRAVENOUS
  Administered 2022-09-06 (×2): 80 ug via INTRAVENOUS

## 2022-09-06 MED ORDER — FLEET ENEMA 7-19 GM/118ML RE ENEM
1.0000 | ENEMA | Freq: Once | RECTAL | Status: AC | PRN
  Administered 2022-09-09: 1 via RECTAL
  Filled 2022-09-06: qty 1

## 2022-09-06 MED ORDER — POLYETHYLENE GLYCOL 3350 17 G PO PACK
17.0000 g | PACK | Freq: Every day | ORAL | Status: DC | PRN
  Administered 2022-09-08 – 2022-09-12 (×2): 17 g via ORAL
  Filled 2022-09-06 (×2): qty 1

## 2022-09-06 MED ORDER — PROPOFOL 10 MG/ML IV BOLUS
INTRAVENOUS | Status: AC
  Filled 2022-09-06: qty 20

## 2022-09-06 MED ORDER — MIDAZOLAM HCL 2 MG/2ML IJ SOLN
INTRAMUSCULAR | Status: AC
  Filled 2022-09-06: qty 2

## 2022-09-06 MED ORDER — PHENYLEPHRINE HCL-NACL 20-0.9 MG/250ML-% IV SOLN
INTRAVENOUS | Status: DC | PRN
  Administered 2022-09-06: 25 ug/min via INTRAVENOUS

## 2022-09-06 MED ORDER — KETAMINE HCL 10 MG/ML IJ SOLN
INTRAMUSCULAR | Status: DC | PRN
  Administered 2022-09-06: 10 mg via INTRAVENOUS
  Administered 2022-09-06: 30 mg via INTRAVENOUS

## 2022-09-06 MED ORDER — KETAMINE HCL 50 MG/5ML IJ SOSY
PREFILLED_SYRINGE | INTRAMUSCULAR | Status: AC
  Filled 2022-09-06: qty 5

## 2022-09-06 MED ORDER — DEXAMETHASONE SODIUM PHOSPHATE 10 MG/ML IJ SOLN
INTRAMUSCULAR | Status: DC | PRN
  Administered 2022-09-06: 10 mg via INTRAVENOUS

## 2022-09-06 MED ORDER — LIDOCAINE-EPINEPHRINE 1 %-1:100000 IJ SOLN
INTRAMUSCULAR | Status: DC | PRN
  Administered 2022-09-06: 6 mL

## 2022-09-06 SURGICAL SUPPLY — 68 items
ADH SKN CLS APL DERMABOND .7 (GAUZE/BANDAGES/DRESSINGS) ×1
APL SKNCLS STERI-STRIP NONHPOA (GAUZE/BANDAGES/DRESSINGS) ×1
BAG COUNTER SPONGE SURGICOUNT (BAG) ×1 IMPLANT
BAG SPNG CNTER NS LX DISP (BAG) ×2
BAND INSRT 18 STRL LF DISP RB (MISCELLANEOUS)
BAND RUBBER #18 3X1/16 STRL (MISCELLANEOUS) IMPLANT
BENZOIN TINCTURE PRP APPL 2/3 (GAUZE/BANDAGES/DRESSINGS) ×1 IMPLANT
BIT DRILL NEURO 2X3.1 SFT TUCH (MISCELLANEOUS) ×1 IMPLANT
BIT DRILL WIRE PASS 1.3MM (BIT) IMPLANT
BIT DRILL YUKON ADJ 2.8 DISP (DRILL) IMPLANT
BLADE CLIPPER SURG (BLADE) IMPLANT
BUR MATCHSTICK NEURO 3.0 LAGG (BURR) IMPLANT
CANISTER SUCT 3000ML PPV (MISCELLANEOUS) ×1 IMPLANT
DERMABOND ADVANCED .7 DNX12 (GAUZE/BANDAGES/DRESSINGS) IMPLANT
DRAIN JACKSON PRT FLT 7MM (DRAIN) IMPLANT
DRAPE C-ARM 42X72 X-RAY (DRAPES) ×2 IMPLANT
DRAPE LAPAROTOMY 100X72 PEDS (DRAPES) ×1 IMPLANT
DRAPE MICROSCOPE SLANT 54X150 (MISCELLANEOUS) IMPLANT
DRAPE SURG 17X23 STRL (DRAPES) ×1 IMPLANT
DRILL NEURO 2X3.1 SOFT TOUCH (MISCELLANEOUS) ×1
DRILL WIRE PASS 1.3MM (BIT)
DRILL YUKON ADJ 2.8 DISP (DRILL) ×1
DRSG OPSITE POSTOP 4X6 (GAUZE/BANDAGES/DRESSINGS) ×1 IMPLANT
DURAPREP 26ML APPLICATOR (WOUND CARE) ×1 IMPLANT
ELECT COATED BLADE 2.86 ST (ELECTRODE) ×1 IMPLANT
ELECT REM PT RETURN 9FT ADLT (ELECTROSURGICAL) ×1
ELECTRODE REM PT RTRN 9FT ADLT (ELECTROSURGICAL) ×1 IMPLANT
EVACUATOR SILICONE 100CC (DRAIN) IMPLANT
GAUZE 4X4 16PLY ~~LOC~~+RFID DBL (SPONGE) IMPLANT
GLOVE BIOGEL PI IND STRL 7.5 (GLOVE) ×2 IMPLANT
GLOVE ECLIPSE 7.5 STRL STRAW (GLOVE) ×1 IMPLANT
GLOVE EXAM NITRILE XL STR (GLOVE) IMPLANT
GLOVE SURG ENC MOIS LTX SZ8 (GLOVE) ×1 IMPLANT
GLOVE SURG SS PI 7.0 STRL IVOR (GLOVE) IMPLANT
GOWN STRL REUS W/ TWL LRG LVL3 (GOWN DISPOSABLE) ×2 IMPLANT
GOWN STRL REUS W/ TWL XL LVL3 (GOWN DISPOSABLE) ×2 IMPLANT
GOWN STRL REUS W/TWL 2XL LVL3 (GOWN DISPOSABLE) IMPLANT
GOWN STRL REUS W/TWL LRG LVL3 (GOWN DISPOSABLE)
GOWN STRL REUS W/TWL XL LVL3 (GOWN DISPOSABLE) ×3
HEMOSTAT POWDER KIT SURGIFOAM (HEMOSTASIS) ×1 IMPLANT
KIT BASIN OR (CUSTOM PROCEDURE TRAY) ×1 IMPLANT
KIT TURNOVER KIT B (KITS) ×1 IMPLANT
MARKER SKIN DUAL TIP RULER LAB (MISCELLANEOUS) IMPLANT
NDL SPNL 18GX3.5 QUINCKE PK (NEEDLE) IMPLANT
NEEDLE HYPO 22GX1.5 SAFETY (NEEDLE) ×1 IMPLANT
NEEDLE SPNL 18GX3.5 QUINCKE PK (NEEDLE) IMPLANT
NS IRRIG 1000ML POUR BTL (IV SOLUTION) ×1 IMPLANT
PACK LAMINECTOMY NEURO (CUSTOM PROCEDURE TRAY) ×1 IMPLANT
PAD ARMBOARD 7.5X6 YLW CONV (MISCELLANEOUS) ×3 IMPLANT
PIN MAYFIELD SKULL DISP (PIN) ×1 IMPLANT
PUTTY BONE 100 VESUVIUS 2.5CC (Putty) IMPLANT
ROD STRT CASP 3.5X120 NS (Rod) IMPLANT
ROD STRT CASP 3.5X80 NS (Rod) IMPLANT
SCREW PA YUKON 3.5X12 (Screw) IMPLANT
SCREW PA YUKON OCT 4X14 (Screw) IMPLANT
SCREW SET SPINAL YUKON (Set) IMPLANT
SPIKE FLUID TRANSFER (MISCELLANEOUS) ×1 IMPLANT
SPONGE SURGIFOAM ABS GEL SZ50 (HEMOSTASIS) IMPLANT
SPONGE T-LAP 4X18 ~~LOC~~+RFID (SPONGE) IMPLANT
STRIP CLOSURE SKIN 1/2X4 (GAUZE/BANDAGES/DRESSINGS) ×1 IMPLANT
SUT MNCRL AB 4-0 PS2 18 (SUTURE) ×1 IMPLANT
SUT VIC AB 0 CT1 18XCR BRD8 (SUTURE) ×1 IMPLANT
SUT VIC AB 0 CT1 8-18 (SUTURE) ×2
SUT VIC AB 2-0 CP2 18 (SUTURE) ×1 IMPLANT
SYR 3ML LL SCALE MARK (SYRINGE) IMPLANT
TOWEL GREEN STERILE (TOWEL DISPOSABLE) ×1 IMPLANT
TOWEL GREEN STERILE FF (TOWEL DISPOSABLE) ×1 IMPLANT
WATER STERILE IRR 1000ML POUR (IV SOLUTION) ×1 IMPLANT

## 2022-09-06 NOTE — Anesthesia Preprocedure Evaluation (Addendum)
Anesthesia Evaluation  Patient identified by MRN, date of birth, ID band Patient awake    Reviewed: Allergy & Precautions, NPO status , Patient's Chart, lab work & pertinent test results  History of Anesthesia Complications (+) PONV and history of anesthetic complications  Airway Mallampati: III  TM Distance: <3 FB Neck ROM: Limited  Mouth opening: Limited Mouth Opening Comment: C-collar in situ  Dental no notable dental hx.    Pulmonary Current Smoker   Pulmonary exam normal        Cardiovascular negative cardio ROS + dysrhythmias  Rhythm:Regular Rate:Normal     Neuro/Psych   Anxiety Depression    negative neurological ROS     GI/Hepatic negative GI ROS, Neg liver ROS,,,  Endo/Other  Hypothyroidism    Renal/GU negative Renal ROS  negative genitourinary   Musculoskeletal Cervical cord compression   Abdominal Normal abdominal exam  (+)   Peds  Hematology negative hematology ROS (+)   Anesthesia Other Findings   Reproductive/Obstetrics                             Anesthesia Physical Anesthesia Plan  ASA: 3  Anesthesia Plan: General   Post-op Pain Management: Celebrex PO (pre-op)*, Tylenol PO (pre-op)* and Ketamine IV*   Induction: Intravenous  PONV Risk Score and Plan: 3 and Ondansetron, Dexamethasone, Midazolam and Treatment may vary due to age or medical condition  Airway Management Planned: Mask, Oral ETT and Video Laryngoscope Planned  Additional Equipment: None  Intra-op Plan:   Post-operative Plan: Extubation in OR  Informed Consent: I have reviewed the patients History and Physical, chart, labs and discussed the procedure including the risks, benefits and alternatives for the proposed anesthesia with the patient or authorized representative who has indicated his/her understanding and acceptance.     Dental advisory given  Plan Discussed with: CRNA  Anesthesia Plan  Comments: (Lab Results      Component                Value               Date                      WBC                      6.2                 09/05/2022                HGB                      14.1                09/05/2022                HCT                      43.4                09/05/2022                MCV                      96.9                09/05/2022  PLT                      227                 09/05/2022             Lab Results      Component                Value               Date                      NA                       134 (L)             09/05/2022                K                        3.7                 09/05/2022                CO2                      24                  09/05/2022                GLUCOSE                  94                  09/05/2022                BUN                      20                  09/05/2022                CREATININE               0.45                09/05/2022                CALCIUM                  9.2                 09/05/2022                GFRNONAA                 >60                 09/05/2022           )       Anesthesia Quick Evaluation

## 2022-09-06 NOTE — Transfer of Care (Signed)
Immediate Anesthesia Transfer of Care Note  Patient: Arlen Legendre  Procedure(s) Performed: Posterior Cervical Fusion, Foraminotomy , Cervical Five-Six, Cervical Six-Seven; Extension of  fusion Cervical Four- Thoracic One  Patient Location: PACU  Anesthesia Type:General  Level of Consciousness: drowsy  Airway & Oxygen Therapy: Patient Spontanous Breathing  Post-op Assessment: Report given to RN and Post -op Vital signs reviewed and stable  Post vital signs: Reviewed and stable  Last Vitals:  Vitals Value Taken Time  BP 149/76 09/06/22 1833  Temp 36.4 C 09/06/22 1833  Pulse 87 09/06/22 1833  Resp 16 09/06/22 1812  SpO2 98 % 09/06/22 1833  Vitals shown include unvalidated device data.  Last Pain:  Vitals:   09/06/22 1833  TempSrc: Oral  PainSc:       Patients Stated Pain Goal: 3 (31/54/00 8676)  Complications: There were no known notable events for this encounter.

## 2022-09-06 NOTE — Consult Note (Signed)
CC: progressive weakness  HPI:     Patient is a 62 y.o. female with widely metastatic breast cancer s/p recent C1-4 posterior fusion for pathologic fracture developed progressive difficulty with walking and coordination, weakness in arms and legs.  She was found to have worsening stenosis and cord compression at C6 from worsening pathologic compression fractures of C5 and C6.    Patient Active Problem List   Diagnosis Date Noted   Elevated alkaline phosphatase level 09/04/2022   Paresthesia and weakness of both lower extremities 09/04/2022   Secondary cancer of brain ([place]) 09/01/2022   C2 cervical fracture ([place]) 08/17/2022   Metastatic adenocarcinoma ([place]) 08/17/2022   Genetic testing 08/14/2022   Cancer, metastatic to bone ([place]) 08/02/2022   Family history of melanoma 08/02/2022   Family history of breast cancer 08/02/2022   Cervicalgia 06/16/2022   Stress 05/04/2022   Hyperglycemia 04/14/2021   Lobular carcinoma in situ (LCIS) of right breast 02/04/2020   Malignant neoplasm of lower-inner quadrant of left breast in female, estrogen receptor positive ([place]) 01/22/2018   Hypothyroidism 04/04/2017   Nicotine dependence with current use 04/04/2017   Excessive drinking of alcohol 04/04/2017   Menopausal symptom 04/04/2017   Past Medical History:  Diagnosis Date   Anxiety    Cancer ([place]) 2022   right breast LCIS   Cancer ([place]) 2019   left breast   Depression    Dysrhythmia    SVT, s/p ablation ~ 2012 in at [facility]   Ectopic pregnancy    Family history of breast cancer    Family history of melanoma    History of radiation therapy 04/22/18-06/03/18   Left Breast, left SCV, axilla 50 Gy in 25 fractions, Left breast boost 10 Gy in 5 fractions.    Hypothyroidism    Personal history of radiation therapy    PONV (postoperative nausea and vomiting)    Thyroid disease     Past Surgical History:  Procedure Laterality Date   APPENDECTOMY     BILATERAL SALPINGECTOMY      BREAST BIOPSY Right 2014   fibroadenoma   BREAST BIOPSY Left 01/16/2018   BREAST BIOPSY Right 02/02/2020   LCIS   BREAST BIOPSY Right 08/04/2020   x2 LCIS   BREAST BIOPSY Right 08/13/2020   x2   BREAST EXCISIONAL BIOPSY Left 1990   benign   BREAST EXCISIONAL BIOPSY Right 02/02/2020   LCIS   BREAST LUMPECTOMY Left 01/22/2018   BREAST LUMPECTOMY WITH AXILLARY LYMPH NODE BIOPSY Left 02/21/2018   Procedure: LEFT BREAST LUMPECTOMY WITH AXILLARY LYMPH NODE BIOPSY;  Surgeon: [name], MD;  Location: [place];  Service: General;  Laterality: Left;   BREAST LUMPECTOMY WITH RADIOACTIVE SEED LOCALIZATION Right 03/18/2020   Procedure: RIGHT BREAST LUMPECTOMY WITH RADIOACTIVE SEED LOCALIZATION;  Surgeon: [name], MD;  Location: [place];  Service: General;  Laterality: Right;   BREAST SURGERY Left 1993   cyst removed   ENDOMETRIAL ABLATION     EYE SURGERY     GLAUCOMA SURGERY Bilateral    POSTERIOR CERVICAL FUSION/FORAMINOTOMY N/A 08/17/2022   Procedure: Cervical One-Cervical Four POSTERIOR CERVICAL FUSION,  Cervical One LAMINECTOMY REDUCTION OF Cervical Two Fracture, Biopsy of Right Cerical Two Pars  Lesion;  Surgeon: Vallarie Mare, MD;  Location: [address];  Service: Neurosurgery;  Laterality: N/A;   RADIOACTIVE SEED GUIDED EXCISIONAL BREAST BIOPSY Right 04/28/2021   Procedure: RADIOACTIVE SEED GUIDED EXCISIONAL RIGHT BREAST BIOPSY X2;  Surgeon: [name], MD;  Location: [place];  Service: General;  Laterality: Right;   RE-EXCISION OF BREAST LUMPECTOMY Left 03/20/2018   Procedure: RE-EXCISION OF BREAST LUMPECTOMY;  Surgeon: [name], MD;  Location: [place];  Service: General;  Laterality: Left;    Medications Prior to Admission  Medication Sig Dispense Refill Last Dose   citalopram (CELEXA) 20 MG tablet Take 1 tablet (20 mg total) by mouth daily. 90 tablet 3 09/03/2022   cyclobenzaprine (FLEXERIL) 10 MG tablet Take 1 tablet (10 mg total) by  mouth 3 (three) times daily as needed for muscle spasms. 30 tablet 0 unk   gabapentin (NEURONTIN) 100 MG capsule Take 1 capsule (100 mg total) by mouth 3 (three) times daily for 15 days. 45 capsule 0 09/03/2022   letrozole (FEMARA) 2.5 MG tablet Take 2.5 mg by mouth daily.   09/03/2022   levothyroxine (SYNTHROID) 75 MCG tablet Take 1 tablet (75 mcg total) by mouth daily before breakfast. 90 tablet 3 09/04/2022 at am   OVER THE COUNTER MEDICATION Take 1 tablet by mouth daily. Protandim Supplement   09/03/2022   oxyCODONE-acetaminophen (PERCOCET) 5-325 MG tablet Take 1-2 tablets by mouth every 4 (four) hours as needed for severe pain (postoperative pain). (Patient taking differently: Take 0.5-1 tablets by mouth every 4 (four) hours.) 20 tablet 0 unk   predniSONE (STERAPRED UNI-PAK 21 TAB) 10 MG (21) TBPK tablet Take by mouth daily. Take 6 tabs by mouth daily  for 2 days, then 5 tabs for 2 days, then 4 tabs for 2 days, then 3 tabs for 2 days, 2 tabs for 2 days, then 1 tab by mouth daily for 2 days (Patient taking differently: Take 10-60 mg by mouth See admin instructions. Take 60 mg by mouth once daily with food for 2 days, 50 mg once daily for 2 days, 40 mg once daily for 2 days, 30 mg once daily for 2 days, 20 mg once daily for 2 days, 10 mg once daily for 2 days, then d/c) 42 tablet 0 09/03/2022   Probiotic Product (PROBIOTIC PO) Take 1 capsule by mouth daily.   09/03/2022   traMADol (ULTRAM) 50 MG tablet Take 50 mg by mouth every 6 (six) hours as needed (for pain).   unk   TYLENOL 500 MG tablet Take 500 mg by mouth every 6 (six) hours as needed for mild pain or headache.   unk   Allergies  Allergen Reactions   Morphine And Related Other (See Comments)    "Makes her angry"    Social History   Tobacco Use   Smoking status: Every Day    Packs/day: 0.50    Years: 40.00    Total pack years: 20.00    Types: Cigarettes   Smokeless tobacco: Never  Substance Use Topics   Alcohol use: Not Currently     Alcohol/week: 28.0 standard drinks of alcohol    Types: 28 Cans of beer per week    Comment: drinks 4 beers daily    Family History  Problem Relation Age of Onset   Heart disease Mother    Pneumonia Father    Hyperlipidemia Sister    Melanoma Sister 60       hx of 5 melanomas   Lung cancer Maternal Uncle        mesothelioma   Pneumonia Maternal Uncle    Dementia Paternal Grandmother    Lung disease Paternal Grandfather    Breast cancer Other        MGMs sister dx < 61  Breast cancer Other        PGFs sister dx < 80   Breast cancer Cousin        mother's mat first cousin, dx < 50     Review of Systems Pertinent items are noted in HPI.  Objective:   Patient Vitals for the past 8 hrs:  BP Temp Temp src Pulse Resp SpO2  09/06/22 0644 136/71 (!) 97.5 F (36.4 C) Oral 83 18 99 %   I/O last 3 completed shifts: In: 510 [P.O.:510] Out: 300 [Urine:300] No intake/output data recorded.    NAD Posterior incision well-healed Collar in place 3/5 hand grip bilaterally, 4-/5 distal, 3/5 proximal LE strength Decreased fine touch trunk and LEs  Data ReviewCBC:  Lab Results  Component Value Date   WBC 6.2 09/05/2022   RBC 4.48 09/05/2022   BMP:  Lab Results  Component Value Date   GLUCOSE 94 09/05/2022   CO2 24 09/05/2022   BUN 20 09/05/2022   CREATININE 0.45 09/05/2022   CREATININE 0.52 08/02/2022   CALCIUM 9.2 09/05/2022   Radiology review:  MRIs reviewed.  Widespread spine and osseous disease, visceral disease in liver, possibly in lungs  Assessment:   Principal Problem:   Paresthesia and weakness of both lower extremities Active Problems:   Hypothyroidism   Malignant neoplasm of lower-inner quadrant of left breast in female, estrogen receptor positive ([name])   Cancer, metastatic to bone ([name])   Elevated alkaline phosphatase level   Plan:   -Had a long discussion with patient.  I also had a interdisciplinary discussion with Dr. Morey Hummingbird and Dr. Isidore Moos.  It is  a difficult situation with no clear right answer.  Patient is clearly losing function fairly rapidly.  And given the patient is mostly following this.  Cortex rather than pure epidural disease, additional surgery with decompression and extension of the fusion with likely give her the best neurologic outcome.  However, it comes at the cost of risk, recuperation, and delay of systemic treatment in the face of widespread and progressive malignant disease.  It certainly would be reasonable to consider palliative care and assistance.  However, if patient wishes to try for aggressive treatment, surgery would likely be the best first option for this problem as it gives her the best chance to maintain her quality of life.  Patient discussed with the family and wished to proceed with surgery.  I discussed risk, benefits, terms, and expected convalescence.  She wished to proceed with surgery.  Informed consent was obtained.

## 2022-09-06 NOTE — Anesthesia Procedure Notes (Addendum)
Procedure Name: Intubation Date/Time: 09/06/2022 2:02 PM  Performed by: Ester Rink, CRNAPre-anesthesia Checklist: Patient identified, Emergency Drugs available, Suction available and Patient being monitored Patient Re-evaluated:Patient Re-evaluated prior to induction Oxygen Delivery Method: Circle system utilized Preoxygenation: Pre-oxygenation with 100% oxygen Induction Type: IV induction Ventilation: Mask ventilation without difficulty Laryngoscope Size: Glidescope and 3 Grade View: Grade I Tube type: Oral Tube size: 7.0 mm Number of attempts: 1 Airway Equipment and Method: Stylet and Oral airway Placement Confirmation: ETT inserted through vocal cords under direct vision, positive ETCO2 and breath sounds checked- equal and bilateral Secured at: 22 cm Tube secured with: Tape Dental Injury: Teeth and Oropharynx as per pre-operative assessment

## 2022-09-06 NOTE — Progress Notes (Signed)
PT Cancellation Note  Patient Details Name: Martha [name] MRN: [id number] DOB: [birth year]   Cancelled Treatment:    Reason Eval/Treat Not Completed: Patient at procedure or test/unavailable. Per nursing, pt at [facility] for surgery today. Will check back for PT eval tomorrow as appropriate.    Talbot Grumbling PT, DPT 09/06/22, 11:16 AM

## 2022-09-06 NOTE — Progress Notes (Signed)
This is a no charge note.  Discussed with Dr. Isidore Moos, patient will transfer to [name] today for spinal surgery.    The hospitalist service will sign off.  The patient should continue her levothyroxine perioperatively without change.   If medical complications arise, please place consult and the hospitalist service will be glad to re-engage.

## 2022-09-06 NOTE — Op Note (Signed)
PREOP DIAGNOSIS: Cervical cord compression with myelopathy  POSTOP DIAGNOSIS: Cervical cord compression with myelopathy   PROCEDURE: 1. Posterior arthrodesis C4-5, C5-6, C6-7 3. Laminectomy and foraminotomies, C4-5, C5-6, C6-7 5. Extension of previous segmental instrumentation with lateral mass/pedicle screw and rod construct 6. Harvest of local autograft 7. Use of morselized bone allograft   SURGEON: Dr. Duffy Rhody, MD  ASSISTANT: None  ANESTHESIA: General Endotracheal  EBL: 150 ml  IMPLANTS: Stryker 3.5 mm x 12 mm lateral mass screws at C5, C6 and C7 bilaterally 90 mm and 80 mm rods [place] DBM  SPECIMENS: None  DRAINS: None  COMPLICATIONS: None immediate  CONDITION: Hemodynamically stable to PACU  HISTORY: Martha [name] is a 62 y.o. y.o. female with widespread metastatic disease.  She recently underwent C1-4 posterior fusion for severe pathologic fracture.  She unfortunately developed worsening pathologic fracture at C5 and C6 with rapidly worsening myelopathy and cord compression.  Risks, benefits, alternatives, and expected convalescence were discussed with the patient.  Risks discussed included but were not limited to bleeding, pain, infection, pseudoarthrosis, hardware failure, adjacent segment disease, CSF leak, neurologic deficits, weakness, numbness, paralysis, coma, and death. After all questions were answered, informed consent was obtained.  PROCEDURE IN DETAIL: The patient was brought to the operating room and transferred to the operative table. After induction of general anesthesia, patient's head was placed in a [place] head holder and the patient was positioned prone on the operative table with all pressure points meticulously padded and secured in neutral position. The skin of the posterior neck was then prepped and draped in the usual sterile fashion.  Previous skin incision was then opened sharply and extended down to the C7 spinous process.   Previous fascial and dermal stitches were cut and self-retaining retractor placed to expose the previous hardware.  Bovie electrocautery was used to dissect the subcutaneous tissue and sharply open the fascia.  Paraspinous muscles were dissected off from C lamina in subperiosteal fashion.  Self-retaining retractor was used.  Screw caps were removed and the previous rods were removed.  Inferior portion of the C4 lamina was removed with high-speed drill.  En bloc laminectomies were then performed of C5, C6, and C7 by drilling troughs at the junction of the lateral mass and lamina bilaterally and removing intervening ligamentum flavum.   Additional foraminotomies were performed at C4-5, C5-6, and C6-7 with Kerrison rongeur decompression was confirmed with easy passage.  Meticulous epidural hemostasis obtained.  Lateral mass screws were then placed at C5, C6, and C7 using the Magerl technique.  Pilot holes were drilled followed by our relative probe confirming good cannulation of the lateral mass.  The holes were then tapped and lateral mass screws were placed with acceptable purchase.  Her C7 lateral mass was more corticated so this had the best purchase but osteopenia was noted at the other lateral masses.  The lateral masses were then decorticated including the joint spaces.  Rod was then placed in the tulip heads from C1 down to C7 bilaterally and secured with screw caps and final tightened.  Final x-ray confirmed appropriate instrumentation placement.  The wound was irrigated thoroughly with antibiotic impregnated irrigation.  Allograft was placed in the lateral gutters bilaterally.  Vancomycin powder was placed in the wound.  A 7 flat JP drain was placed in the subfascial space and tunneled out the skin and secured with a stitch.  The muscles were injected with Exparel mixed with Marcaine.  The muscle layer was closed with 0 Vicryl  stitches.  The fascia was closed with 0 Vicryl stitches.  The subcutaneous layer  was closed with 0 Vicryl stitches.  The dermal layer was closed with 2-0 Vicryl stitches in buried fashion.  Skin was closed with 4-0 Monocryl in subcuticular manner followed by Dermabond.  A sterile dressing was then placed patient was then removed from [place] head holder and flipped supine and extubated by the anesthesia service.  All counts were correct at the end of surgery.  No complications were noted.

## 2022-09-07 ENCOUNTER — Ambulatory Visit: Payer: BC Managed Care – PPO

## 2022-09-07 ENCOUNTER — Other Ambulatory Visit: Payer: Self-pay | Admitting: Radiation Therapy

## 2022-09-07 ENCOUNTER — Telehealth: Payer: Self-pay | Admitting: Radiology

## 2022-09-07 ENCOUNTER — Ambulatory Visit: Payer: BC Managed Care – PPO | Admitting: Radiation Oncology

## 2022-09-07 NOTE — Evaluation (Signed)
Physical Therapy Evaluation Patient Details Name: Martha [name] MRN: [id number] DOB: [birth year] Today's Date: 09/07/2022  History of Present Illness  62 yo F with cervical cord compression with myelopathy associated with widely metastatic breast cancer. Now s/p C1-C7 posterior fusion with decompression 1/10. Pt with known brain and spinal mets. PMH of anxiety.  Clinical Impression  Pt admitted with above diagnosis. Pt tearful upon entry to room.  Did a lot to incr pts comfort by having her stand and then fixed pillows under pt as well as ice to shoulders once reclined and pt not crying on departure. Will need AIR for therapy prior to d/c home.  Has support at home.  Pt currently with functional limitations due to the deficits listed below (see PT Problem List). Pt will benefit from skilled PT to increase their independence and safety with mobility to allow discharge to the venue listed below.          Recommendations for follow up therapy are one component of a multi-disciplinary discharge planning process, led by the attending physician.  Recommendations may be updated based on patient status, additional functional criteria and insurance authorization.  Follow Up Recommendations Acute inpatient rehab (3hours/day)      Assistance Recommended at Discharge Frequent or constant Supervision/Assistance  Patient can return home with the following  Assistance with cooking/housework;Assist for transportation;Help with stairs or ramp for entrance;A lot of help with walking and/or transfers;A lot of help with bathing/dressing/bathroom    Equipment Recommendations Other (comment) (TBA)  Recommendations for Other Services  Rehab consult    Functional Status Assessment Patient has had a recent decline in their functional status and demonstrates the ability to make significant improvements in function in a reasonable and predictable amount of time.     Precautions / Restrictions  Precautions Precautions: Cervical;Fall Precaution Booklet Issued: No Precaution Comments: Reviewed handout and pt was cued for precautions during functional mobility. Required Braces or Orthoses: Cervical Brace Cervical Brace: Hard collar Restrictions Weight Bearing Restrictions: No      Mobility  Bed Mobility               General bed mobility comments: Pt in chair    Transfers Overall transfer level: Needs assistance Equipment used: Rolling walker (2 wheels) Transfers: Sit to/from Stand, Bed to chair/wheelchair/BSC Sit to Stand: Min assist, Mod assist           General transfer comment: Verbal cueing for hand placement.Assist for power  up from recliner and immediately requires assist to stabilize in standing. Pt with poor balance and posterior lean. Poor control of LEs    Ambulation/Gait                  Stairs            Wheelchair Mobility    Modified Rankin (Stroke Patients Only)       Balance Overall balance assessment: Needs assistance Sitting-balance support: No upper extremity supported, Feet supported Sitting balance-Leahy Scale: Good     Standing balance support: During functional activity, Bilateral upper extremity supported Standing balance-Leahy Scale: Poor Standing balance comment: Requires mod assist and RW for support statically                             Pertinent Vitals/Pain Pain Assessment Pain Assessment: Faces Faces Pain Scale: Hurts little more Pain Location: General grimacing during mobility/transfers. pain in her L axilla, upper arms Pain Descriptors / Indicators: Aching  Pain Intervention(s): Limited activity within patient's tolerance, Monitored during session, Repositioned, Premedicated before session, Ice applied    Home Living Family/patient expects to be discharged to:: Private residence Living Arrangements: Spouse/significant other Available Help at Discharge: Family;Available 24  hours/day Type of Home: House Home Access: Stairs to enter Entrance Stairs-Rails: Can reach both Entrance Stairs-Number of Steps: 2 Alternate Level Stairs-Number of Steps: 1 step down into living room Home Layout: One level Home Equipment: Rollator (4 wheels);Rolling Walker (2 wheels);Wheelchair - Sport and exercise psychologist Comments: adjustable bed    Prior Function Prior Level of Function : Independent/Modified Independent;Driving                     Hand Dominance   Dominant Hand: Right    Extremity/Trunk Assessment   Upper Extremity Assessment Upper Extremity Assessment: Defer to OT evaluation RUE Deficits / Details: R shoulder flexion severely limited- 20 degrees actively. RUE: Shoulder pain with ROM RUE Sensation: decreased light touch RUE Coordination: decreased fine motor;decreased gross motor LUE Deficits / Details: Flexion severely limited- about 40 degrees LUE: Shoulder pain with ROM LUE Sensation: decreased light touch LUE Coordination: decreased fine motor;decreased gross motor    Lower Extremity Assessment Lower Extremity Assessment: RLE deficits/detail;LLE deficits/detail RLE Deficits / Details: unable to fully assess due to pain LLE Deficits / Details: unable to fully assess due to pain (    Cervical / Trunk Assessment Cervical / Trunk Assessment: Neck Surgery  Communication   Communication: No difficulties  Cognition Arousal/Alertness: Awake/alert Behavior During Therapy: WFL for tasks assessed/performed Overall Cognitive Status: Within Functional Limits for tasks assessed                                          General Comments General comments (skin integrity, edema, etc.): Aspen collar on throughout session    Exercises     Assessment/Plan    PT Assessment Patient needs continued PT services  PT Problem List Decreased strength;Decreased range of motion;Decreased activity tolerance;Decreased balance;Decreased  mobility;Decreased knowledge of use of DME;Decreased safety awareness;Decreased knowledge of precautions;Pain       PT Treatment Interventions DME instruction;Gait training;Stair training;Therapeutic activities;Functional mobility training;Therapeutic exercise;Balance training;Patient/family education    PT Goals (Current goals can be found in the Care Plan section)  Acute Rehab PT Goals Patient Stated Goal: Return home with family PT Goal Formulation: With patient/family Time For Goal Achievement: 09/08/22 Potential to Achieve Goals: Good    Frequency Min 5X/week     Co-evaluation               AM-PAC PT "6 Clicks" Mobility  Outcome Measure Help needed turning from your back to your side while in a flat bed without using bedrails?: A Little Help needed moving from lying on your back to sitting on the side of a flat bed without using bedrails?: A Little Help needed moving to and from a bed to a chair (including a wheelchair)?: A Lot Help needed standing up from a chair using your arms (e.g., wheelchair or bedside chair)?: A Lot Help needed to walk in hospital room?: Total Help needed climbing 3-5 steps with a railing? : Total 6 Click Score: 12    End of Session Equipment Utilized During Treatment: Gait belt;Cervical collar Activity Tolerance: Patient limited by fatigue;Patient limited by pain Patient left: with call bell/phone within reach;in chair Nurse Communication: Mobility status PT Visit  Diagnosis: Unsteadiness on feet (R26.81);Pain Pain - Right/Left:  (bilateral) Pain - part of body: Arm (neck)    Time: 8638-1771 PT Time Calculation (min) (ACUTE ONLY): 15 min   Charges:   PT Evaluation $PT Eval Moderate Complexity: 1 Mod          Davidjames Blansett M,PT Acute Rehab Services [phone]   Alvira Philips 09/07/2022, 2:19 PM

## 2022-09-07 NOTE — TOC Initial Note (Signed)
Transition of Care [facility]) - Initial/Assessment Note    Patient Details  Name: Martha [name] MRN: [id number] Date of Birth: December 13, 1960  [facility]) CM/SW Contact:    Curlene Labrum, RN Phone Number: 09/07/2022, 4:20 PM  Clinical Narrative:                 CM met with the patient at the bedside to discuss TOC needs.  The patient was admitted for neck surgery and currently has a hard collar and JP drain intact.    The patient has available DME at the home including WC, RW, rolator and shower bench at the home.    The patient was seen by PT/OT for eval and will have follow up to evaluate for CIR.  The patient inquired about disability and was provided with social resources.  Expected Discharge Plan: IP Rehab Facility Barriers to Discharge: Continued Medical Work up   Patient Goals and CMS Choice Patient states their goals for this hospitalization and ongoing recovery are:: To get better - wants to go to inpatient rehab for CMS Medicare.gov Compare Post Acute Care list provided to:: Patient Choice offered to / list presented to : Patient      Expected Discharge Plan and Services   Discharge Planning Services: CM Consult Post Acute Care Choice: IP Rehab Living arrangements for the past 2 months: Single Family Home                                      Prior Living Arrangements/Services Living arrangements for the past 2 months: Single Family Home Lives with:: Spouse Patient language and need for interpreter reviewed:: Yes Do you feel safe going back to the place where you live?: Yes      Need for Family Participation in Patient Care: Yes (Comment) Care giver support system in place?: Yes (comment) Current home services: DME (Patient has available wheelchair, RW, rolator, and shower bench at the home) Criminal Activity/Legal Involvement Pertinent to Current Situation/Hospitalization: No - Comment as needed  Activities of Daily Living Home  Assistive Devices/Equipment: Wheelchair ADL Screening (condition at time of admission) Patient's cognitive ability adequate to safely complete daily activities?: Yes Is the patient deaf or have difficulty hearing?: No Does the patient have difficulty seeing, even when wearing glasses/contacts?: No Does the patient have difficulty concentrating, remembering, or making decisions?: No Patient able to express need for assistance with ADLs?: Yes Does the patient have difficulty dressing or bathing?: Yes Independently performs ADLs?: No Communication: Independent Is this a change from baseline?: Pre-admission baseline Dressing (OT): Needs assistance Is this a change from baseline?: Pre-admission baseline Grooming: Independent Is this a change from baseline?: Pre-admission baseline Feeding: Independent Bathing: Needs assistance Is this a change from baseline?: Change from baseline, expected to last >3 days Toileting: Needs assistance Is this a change from baseline?: Change from baseline, expected to last >3days In/Out Bed: Needs assistance, Dependent Is this a change from baseline?: Change from baseline, expected to last >3 days Walks in Home: Needs assistance Is this a change from baseline?: Change from baseline, expected to last >3 days Does the patient have difficulty walking or climbing stairs?: Yes Weakness of Legs: Both Weakness of Arms/Hands: None  Permission Sought/Granted Permission sought to share information with : Case Manager, Family Supports, Chartered certified accountant granted to share information with : Yes, Verbal Permission Granted     Permission  granted to share info w AGENCY: CIR to evaluate  Permission granted to share info w Relationship: spouse     Emotional Assessment Appearance:: Appears stated age Attitude/Demeanor/Rapport: Gracious Affect (typically observed): Accepting Orientation: : Oriented to Self, Oriented to Place, Oriented to  Time,  Oriented to Situation Alcohol / Substance Use: Not Applicable Psych Involvement: No (comment)  Admission diagnosis:  Weakness [R53.1] Paresthesia of both lower extremities [R20.2] Patient Active Problem List   Diagnosis Date Noted   Elevated alkaline phosphatase level 09/04/2022   Paresthesia and weakness of both lower extremities 09/04/2022   Secondary cancer of brain ([place]) 09/01/2022   C2 cervical fracture ([place]) 08/17/2022   Metastatic adenocarcinoma ([place]) 08/17/2022   Genetic testing 08/14/2022   Cancer, metastatic to bone ([place]) 08/02/2022   Family history of melanoma 08/02/2022   Family history of breast cancer 08/02/2022   Cervicalgia 06/16/2022   Stress 05/04/2022   Hyperglycemia 04/14/2021   Lobular carcinoma in situ (LCIS) of right breast 02/04/2020   Malignant neoplasm of lower-inner quadrant of left breast in female, estrogen receptor positive ([place]) 01/22/2018   Hypothyroidism 04/04/2017   Nicotine dependence with current use 04/04/2017   Excessive drinking of alcohol 04/04/2017   Menopausal symptom 04/04/2017   PCP:  [name], MD Pharmacy:   [phone] - [place], [place] - [address] [place] [address] Phone: [phone] Fax: [phone]     Social Determinants of Health (SDOH) Social History: [place]: No Food Insecurity (09/04/2022)  Housing: Low Risk  (09/04/2022)  Transportation Needs: No Transportation Needs (09/04/2022)  Utilities: Not At Risk (09/04/2022)  Depression (PHQ2-9): Low Risk  (05/04/2022)  Tobacco Use: High Risk (09/06/2022)   SDOH Interventions:     Readmission Risk Interventions    09/07/2022    4:20 PM  Readmission Risk Prevention Plan  Transportation Screening Complete  PCP or Specialist Appt within 5-7 Days Complete  Home Care Screening Complete  Medication Review (RN CM) Complete

## 2022-09-07 NOTE — Progress Notes (Signed)
Inpatient Rehab Coordinator Note:  I spoke with pt's spouse over the phone to discuss CIR recommendations and goals/expectations of CIR stay.  We reviewed 3 hrs/day of therapy, physician follow up, and average length of stay 2 weeks (dependent upon progress) with goals of min assist to supervision.  Spouse reports that he works and we talked about utilizing friends/family/etc to help bridge the gap and ensure that she has adequate help at home on d/c.  He would like to discuss it with her this evening and we will touch base tomorrow.  Shann Medal, PT, DPT Admissions Coordinator [phone] 09/07/22  3:58 PM

## 2022-09-07 NOTE — Evaluation (Signed)
Occupational Therapy Evaluation Patient Details Name: Martha [name] MRN: [id number] DOB: 1961/01/21 Today's Date: 09/07/2022   History of Present Illness 62 yo F with cervical cord compression with myelopathy associated with widely metastatic breast cancer. Now s/p C1-C7 posterior fusion with decompression 1/10. Pt with known brain and spinal mets. PMH of anxiety.   Clinical Impression   Pt requiring mod A with ADLs and transfers this session, impacted by severely limited B shoulder AROM, pain in neck, BLE weakness with sensory changes, dynamic balance deficits, and cueing required for adherence to cervical precautions. PTA recent surgeries and advancement of mets pt was independent and lives with her husband. Extensive discussion re CIR, with biggest barrier being lack of supervision at home. Encouraged pt to think about friends/family that could provide supervision when husband is at work. Pt is a great candidate for CIR and is incredibly motivated to get stronger and more independent. Pt would benefit from continued acute OT services to facilitate safe d/c home and optimize occupational performance.      Recommendations for follow up therapy are one component of a multi-disciplinary discharge planning process, led by the attending physician.  Recommendations may be updated based on patient status, additional functional criteria and insurance authorization.   Follow Up Recommendations  Acute inpatient rehab (3hours/day)     Assistance Recommended at Discharge Intermittent Supervision/Assistance  Patient can return home with the following A lot of help with bathing/dressing/bathroom;A lot of help with walking and/or transfers;Assistance with feeding;Direct supervision/assist for financial management;Assist for transportation;Direct supervision/assist for medications management    Functional Status Assessment  Patient has had a recent decline in their functional status and demonstrates  the ability to make significant improvements in function in a reasonable and predictable amount of time.  Equipment Recommendations  Other (comment) (defer to next venue)    Recommendations for Other Services Rehab consult     Precautions / Restrictions Precautions Precautions: Cervical;Fall Precaution Booklet Issued: No Precaution Comments: Reviewed handout and pt was cued for precautions during functional mobility. Required Braces or Orthoses: Cervical Brace Cervical Brace: Hard collar Restrictions Weight Bearing Restrictions: No      Mobility Bed Mobility Overal bed mobility: Needs Assistance Bed Mobility: Sidelying to Sit, Rolling Rolling: Min guard Sidelying to sit: Min guard, HOB elevated     Sit to sidelying: Min guard General bed mobility comments: min guard, HOB elevated + rail use and increased time needed    Transfers Overall transfer level: Needs assistance Equipment used: Rolling walker (2 wheels) Transfers: Sit to/from Stand, Bed to chair/wheelchair/BSC Sit to Stand: Min assist, Mod assist     Step pivot transfers: Mod assist     General transfer comment: Verbal cueing for hand placement. great power up from EOB but immediately requires assist to stabilize in standing.      Balance Overall balance assessment: Needs assistance Sitting-balance support: No upper extremity supported, Feet supported Sitting balance-Leahy Scale: Good     Standing balance support: During functional activity, Bilateral upper extremity supported Standing balance-Leahy Scale: Poor                             ADL either performed or assessed with clinical judgement   ADL Overall ADL's : Needs assistance/impaired Eating/Feeding: Modified independent;Sitting Eating/Feeding Details (indicate cue type and reason): c-collar Grooming: Wash/dry hands;Wash/dry face;Sitting;Minimal assistance Grooming Details (indicate cue type and reason): cueing and education  provided on propping up the RUE during grooming and  self feeding to maximize independence Upper Body Bathing: Moderate assistance;Sitting   Lower Body Bathing: Moderate assistance;Sit to/from stand;Cueing for compensatory techniques   Upper Body Dressing : Moderate assistance;Sitting;Cueing for compensatory techniques   Lower Body Dressing: Moderate assistance;Sit to/from stand   Toilet Transfer: Moderate assistance;Rolling walker (2 wheels)   Toileting- Clothing Manipulation and Hygiene: Maximal assistance;Sit to/from stand       Functional mobility during ADLs: Moderate assistance;Rolling walker (2 wheels) General ADL Comments: Mod A overall with the RW for ADL transfers. Good carryover of figure 4 dressing technique. Cueing for reducing knee hyperextension during stand.     Vision Baseline Vision/History: 0 No visual deficits Ability to See in Adequate Light: 0 Adequate Patient Visual Report: No change from baseline Vision Assessment?: No apparent visual deficits     Perception     Praxis      Pertinent Vitals/Pain Pain Assessment Pain Assessment: Faces Faces Pain Scale: Hurts little more Pain Descriptors / Indicators: Aching Pain Intervention(s): Ice applied, Monitored during session, Repositioned     Hand Dominance Right   Extremity/Trunk Assessment Upper Extremity Assessment Upper Extremity Assessment: RUE deficits/detail;LUE deficits/detail RUE Deficits / Details: R shoulder flexion severely limited- 20 degrees actively. RUE: Shoulder pain with ROM RUE Sensation: decreased light touch RUE Coordination: decreased fine motor;decreased gross motor LUE Deficits / Details: Flexion severely limited- about 40 degrees LUE: Shoulder pain with ROM LUE Sensation: decreased light touch LUE Coordination: decreased fine motor;decreased gross motor   Lower Extremity Assessment Lower Extremity Assessment: Defer to PT evaluation   Cervical / Trunk Assessment Cervical /  Trunk Assessment: Neck Surgery   Communication Communication Communication: No difficulties   Cognition Arousal/Alertness: Awake/alert Behavior During Therapy: WFL for tasks assessed/performed Overall Cognitive Status: Within Functional Limits for tasks assessed                                       General Comments  Aspen collar on throughout session    Exercises     Shoulder Instructions      Home Living Family/patient expects to be discharged to:: Private residence Living Arrangements: Spouse/significant other Available Help at Discharge: Family;Available 24 hours/day Type of Home: House Home Access: Stairs to enter [employer] of Steps: 2 Entrance Stairs-Rails: Can reach both Home Layout: One level Alternate Level Stairs-Number of Steps: 1 step down into living room Alternate Level Stairs-Rails: Right Bathroom Shower/Tub: Curtain;Walk-in shower   Bathroom Toilet: Standard Bathroom Accessibility: Yes How Accessible: Accessible via wheelchair;Accessible via walker Home Equipment: Rollator (4 wheels);Rolling Walker (2 wheels);Wheelchair - manual;Shower seat          Prior Functioning/Environment Prior Level of Function : Independent/Modified Independent;Driving                        OT Problem List: Decreased strength;Decreased range of motion;Impaired balance (sitting and/or standing);Pain;Decreased safety awareness;Decreased coordination;Decreased activity tolerance;Decreased knowledge of use of DME or AE;Impaired sensation;Impaired UE functional use      OT Treatment/Interventions: Self-care/ADL training;DME and/or AE instruction;Patient/family education;Balance training    OT Goals(Current goals can be found in the care plan section) Acute Rehab OT Goals Patient Stated Goal: keep getting stronger OT Goal Formulation: With patient Time For Goal Achievement: 09/21/22 Potential to Achieve Goals: Good  OT Frequency: Min  3X/week    Co-evaluation  AM-PAC OT "6 Clicks" Daily Activity     Outcome Measure Help from another person eating meals?: A Little Help from another person taking care of personal grooming?: A Little Help from another person toileting, which includes using toliet, bedpan, or urinal?: A Lot Help from another person bathing (including washing, rinsing, drying)?: A Lot Help from another person to put on and taking off regular upper body clothing?: A Lot Help from another person to put on and taking off regular lower body clothing?: A Lot 6 Click Score: 14   End of Session Equipment Utilized During Treatment: Cervical collar Nurse Communication: Mobility status  Activity Tolerance: Patient tolerated treatment well Patient left: in chair;with call bell/phone within reach  OT Visit Diagnosis: Unsteadiness on feet (R26.81);Other abnormalities of gait and mobility (R26.89);Pain Pain - part of body:  (neck)                Time: 1518-3437 OT Time Calculation (min): 27 min Charges:  OT General Charges $OT Visit: 1 Visit OT Evaluation $OT Eval Moderate Complexity: 1 Mod OT Treatments $Self Care/Home Management : 8-22 mins  Laverle Hobby, OTR/L, CBIS Acute Rehab Office: [phone]   Curtis Sites 09/07/2022, 11:30 AM

## 2022-09-07 NOTE — Progress Notes (Signed)
? ?  Inpatient Rehab Admissions Coordinator : ? ?Per therapy recommendations, patient was screened for CIR candidacy by Lino Wickliff RN MSN.  At this time patient appears to be a potential candidate for CIR. I will place a rehab consult per protocol for full assessment. Please call me with any questions. ? ?Rhayne Chatwin RN MSN ?Admissions Coordinator ?[phone] ?  ?

## 2022-09-07 NOTE — Addendum Note (Signed)
Addendum  created 09/07/22 0849 by Ester Rink, CRNA   Intraprocedure Meds edited

## 2022-09-07 NOTE — Progress Notes (Signed)
Neurosurgery   Patient reports improved hand strength fall surgery. Drain output 90 mL overnight.   S/p C1 to C7 posterior fusion with decompression - PT/OT, mobilize. - continued drain. - possible discharge home tomorrow versus rehab

## 2022-09-07 NOTE — Anesthesia Postprocedure Evaluation (Signed)
Anesthesia Post Note  Patient: Martha [name]  Procedure(s) Performed: Posterior Cervical Fusion, Foraminotomy , Cervical Five-Six, Cervical Six-Seven; Extension of  fusion Cervical Four- Thoracic One     Patient location during evaluation: PACU Anesthesia Type: General Level of consciousness: awake and alert Pain management: pain level controlled Vital Signs Assessment: post-procedure vital signs reviewed and stable Respiratory status: spontaneous breathing, nonlabored ventilation, respiratory function stable and patient connected to nasal cannula oxygen Cardiovascular status: blood pressure returned to baseline and stable Postop Assessment: no apparent nausea or vomiting Anesthetic complications: no   There were no known notable events for this encounter.  Last Vitals:  Vitals:   09/06/22 2353 09/07/22 0251  BP: (!) 162/85 (!) 161/95  Pulse: 90 90  Resp: 18 18  Temp: 36.6 C 36.7 C  SpO2: 94% 96%    Last Pain:  Vitals:   09/07/22 0610  TempSrc:   PainSc: 1                  Abhiraj Dozal P Alexio Sroka

## 2022-09-07 NOTE — Telephone Encounter (Signed)
A PHASE III TRIAL OF STEREOTACTIC RADIOSURGERY COMPARED WITH HIPPOCAMPAL-AVOIDANT WHOLE BRAIN RADIOTHERAPY (HA-WBRT) PLUS MEMANTINE FOR 5-15 BRAIN METASTASES   09/07/2022  PHONE CALL: LVM for [name] to return call. Checking in on status of Lyndie Vanderloop .  [name] returned my call and stated [name] is improving. She is able to feel more in her feet and can tell a difference compared to prior to her surgery. Wendell Fiebig has been able to start OT and has been tolerating this well. They are still unsure of discharge location (home vs rehab facility). If patient is released home, we can meet in person and complete consent documentation as well as baseline assessments depending on if eligibility criteria are still met and/or we have a timeline of when patient will be able to begin radiation. Since patient had surgery, patient will need time to heal. Patient is very interested in participation. [name] stated he could call back next week once they know discharge plans. Estrella Myrtle for his time and look forward to hearing from him next week.   Carol Ada, RT(R)(T) Clinical Research Coordinator

## 2022-09-07 NOTE — Plan of Care (Signed)
  Problem: Education: Goal: Ability to verbalize activity precautions or restrictions will improve Outcome: Progressing   Problem: Education: Goal: Knowledge of the prescribed therapeutic regimen will improve Outcome: Progressing   Problem: Education: Goal: Understanding of discharge needs will improve Outcome: Progressing   Problem: Activity: Goal: Ability to avoid complications of mobility impairment will improve Outcome: Progressing   Problem: Activity: Goal: Will remain free from falls Outcome: Progressing   Problem: Bowel/Gastric: Goal: Gastrointestinal status for postoperative course will improve Outcome: Progressing   Problem: Pain Management: Goal: Pain level will decrease Outcome: Progressing   Problem: Skin Integrity: Goal: Will show signs of wound healing Outcome: Progressing   Problem: Safety: Goal: Ability to remain free from injury will improve Outcome: Progressing

## 2022-09-08 ENCOUNTER — Ambulatory Visit: Payer: BC Managed Care – PPO

## 2022-09-08 NOTE — Progress Notes (Signed)
Physical Therapy Treatment Patient Details Name: Martha [name] MRN: [id number] DOB: 08-Nov-1960 Today's Date: 09/08/2022   History of Present Illness 62 yo F with cervical cord compression with myelopathy associated with widely metastatic breast cancer. Now s/p C1-C7 posterior fusion with decompression 1/10. Pt with known brain and spinal mets. PMH of anxiety.    PT Comments    Pt admitted with above diagnosis. Pt was able to stand and pivot to recliner from bed with mod assist with pt very unsteady on her feet even with use of RW. Pt reports numbness of Les and has very poor coordination of steps. Pain was better today as pt not tearful. Will continue to follow acutely.  Pt currently with functional limitations due to balance and endurance deficits. Pt will benefit from skilled PT to increase their independence and safety with mobility to allow discharge to the venue listed below.      Recommendations for follow up therapy are one component of a multi-disciplinary discharge planning process, led by the attending physician.  Recommendations may be updated based on patient status, additional functional criteria and insurance authorization.  Follow Up Recommendations  Acute inpatient rehab (3hours/day)     Assistance Recommended at Discharge Frequent or constant Supervision/Assistance  Patient can return home with the following Assistance with cooking/housework;Assist for transportation;Help with stairs or ramp for entrance;A lot of help with walking and/or transfers;A lot of help with bathing/dressing/bathroom   Equipment Recommendations  Other (comment) (TBA)    Recommendations for Other Services Rehab consult     Precautions / Restrictions Precautions Precautions: Cervical;Fall Precaution Booklet Issued: No Precaution Comments: Reviewed handout and pt was cued for precautions during functional mobility. Required Braces or Orthoses: Cervical Brace Cervical Brace: Hard  collar Restrictions Weight Bearing Restrictions: No     Mobility  Bed Mobility Overal bed mobility: Needs Assistance Bed Mobility: Sidelying to Sit, Rolling Rolling: Min guard Sidelying to sit: HOB elevated, Min assist       General bed mobility comments: Pt needed assist to initiate LEs and for elevation of trunk. Pt needed cues to sequence movement    Transfers Overall transfer level: Needs assistance Equipment used: Rolling walker (2 wheels) Transfers: Sit to/from Stand, Bed to chair/wheelchair/BSC Sit to Stand: Min assist, Mod assist   Step pivot transfers: Mod assist       General transfer comment: Verbal cueing for hand placement.Assist for power  up from bed and immediately requires assist to stabilize in standing. Pt with poor balance and posterior lean. Poor control of LEs and poor coordination of steps with pt stepping to recliner. Pt unsteady with poor postureal control.  Pt reports LEs are still numb.    Ambulation/Gait                   Stairs             Wheelchair Mobility    Modified Rankin (Stroke Patients Only)       Balance Overall balance assessment: Needs assistance Sitting-balance support: No upper extremity supported, Feet supported Sitting balance-Leahy Scale: Good     Standing balance support: During functional activity, Bilateral upper extremity supported Standing balance-Leahy Scale: Poor Standing balance comment: Requires mod assist and RW for support statically                            Cognition Arousal/Alertness: Awake/alert Behavior During Therapy: WFL for tasks assessed/performed Overall Cognitive Status: Within Functional Limits for  tasks assessed                                          Exercises General Exercises - Lower Extremity Ankle Circles/Pumps: AROM, Both, 10 reps, Supine Long Arc Quad: AROM, Both, 10 reps, Seated Hip Flexion/Marching: AROM, Both, 10 reps, Seated     General Comments        Pertinent Vitals/Pain Pain Assessment Pain Assessment: Faces Faces Pain Scale: Hurts little more Pain Location: General grimacing during mobility/transfers. pain in her L axilla, upper arms Pain Descriptors / Indicators: Aching Pain Intervention(s): Limited activity within patient's tolerance, Monitored during session, Repositioned    Home Living                          Prior Function            PT Goals (current goals can now be found in the care plan section) Acute Rehab PT Goals Patient Stated Goal: Return home with family PT Goal Formulation: With patient/family Time For Goal Achievement: 09/22/22 Potential to Achieve Goals: Good Progress towards PT goals: Progressing toward goals    Frequency    Min 5X/week      PT Plan Current plan remains appropriate    Co-evaluation              AM-PAC PT "6 Clicks" Mobility   Outcome Measure  Help needed turning from your back to your side while in a flat bed without using bedrails?: A Little Help needed moving from lying on your back to sitting on the side of a flat bed without using bedrails?: A Little Help needed moving to and from a bed to a chair (including a wheelchair)?: A Lot Help needed standing up from a chair using your arms (e.g., wheelchair or bedside chair)?: A Lot Help needed to walk in hospital room?: Total Help needed climbing 3-5 steps with a railing? : Total 6 Click Score: 12    End of Session Equipment Utilized During Treatment: Gait belt;Cervical collar Activity Tolerance: Patient limited by fatigue;Patient limited by pain Patient left: with call bell/phone within reach;in chair Nurse Communication: Mobility status;Other (comment) (use Stedy to get pt back; purewick needed) PT Visit Diagnosis: Unsteadiness on feet (R26.81);Pain Pain - Right/Left:  (bilateral) Pain - part of body: Arm (neck)     Time: 1451-1510 PT Time Calculation (min) (ACUTE ONLY):  19 min  Charges:  $Therapeutic Activity: 8-22 mins                     [facility] M,PT Acute Rehab Services [phone]    Alvira Philips 09/08/2022, 4:44 PM

## 2022-09-08 NOTE — Progress Notes (Signed)
Neurosurgery Patient seen an examined. She reports improves strength since surgery. Currently, 4/5 hand grip strength bilaterally. 4+/5 strength more extremities. JP drain in place, 60 mL output. Dressing clean, dry and intact, with some saturation at the drain exit site.  S/p C1 to C7 posterior, decompression and instrumented diffusion for metastatic disease. - Continue PT/OT. Awaiting rehab. - DC JP drain tomorrow -S tart Lovenox after drain removed

## 2022-09-08 NOTE — Progress Notes (Signed)
Neurosurgery.  Patient reports her strength is slowly improving. She has been working with physical therapy and occupational therapy. Grip strength 4/5 bilaterally with 4+/5 strength in lower extremities.  Metastatic breast cancer status with C1 to seven posterior decompression and fusion - Will continue drain for one more day. Can start Lovenox for DVT prophylaxis postop Day #3 tomorrow after drain removal - awaiti ng rehab

## 2022-09-08 NOTE — Progress Notes (Signed)
Inpatient Rehab Admissions Coordinator:   Spoke to pt over the phone to explain goals/expectations of CIR admission.  Insurance auth pending this morning.  Will follow for determination.    Shann Medal, PT, DPT Admissions Coordinator [phone] 09/08/22  8:42 AM

## 2022-09-09 ENCOUNTER — Encounter (HOSPITAL_COMMUNITY): Payer: Self-pay | Admitting: Neurosurgery

## 2022-09-09 LAB — GLUCOSE, CAPILLARY: Glucose-Capillary: 132 mg/dL — ABNORMAL HIGH (ref 70–99)

## 2022-09-09 NOTE — Progress Notes (Signed)
   Providing Compassionate, Quality Care - Together   Subjective: Patient reports her upper extremities are still "uncoordinated," but she feels like she is improving.  Objective: Vital signs in last 24 hours: Temp:  [98.2 F (36.8 C)-98.6 F (37 C)] 98.4 F (36.9 C) (01/13 0757) Pulse Rate:  [98-107] 98 (01/13 0757) Resp:  [16-19] 16 (01/13 0757) BP: (141-153)/(66-82) 153/78 (01/13 0757) SpO2:  [95 %-96 %] 96 % (01/13 0757)  Intake/Output from previous day: 01/12 0701 - 01/13 0700 In: 701.3 [P.O.:500; I.V.:201.3] Out: 2000 [Urine:1950] Intake/Output this shift: Total I/O In: -  Out: 30 [Drains:30]  Alert and oriented x 4 PERRLA CN II-XII grossly intact MAE, 4/5 grip bilaterally, 4+/5 BUE Incision is covered with Honeycomb dressing and Steri Strips; Dressing is clean, dry, and intact   Lab Results: No results for input(s): "WBC", "HGB", "HCT", "PLT" in the last 72 hours. BMET No results for input(s): "NA", "K", "CL", "CO2", "GLUCOSE", "BUN", "CREATININE", "CALCIUM" in the last 72 hours.  Studies/Results: No results found.  Assessment/Plan: Patient underwent C4-5, C5-6, C6-7 posterior arthrodesis by Dr. Marcello Moores on 09/06/2022. She is progressing well. The recommendation is for AIR, which will hopefully happen on Monday pending bed availability and insurance authorization.   LOS: 5 days     Viona Gilmore, DNP, AGNP-C Nurse Practitioner  [facility] [place] [address] [place], [place], [place] [address] P: [phone]    F: [phone]  09/09/2022, 10:00 AM

## 2022-09-10 LAB — CBC
HCT: 36.9 % (ref 36.0–46.0)
Hemoglobin: 13.6 g/dL (ref 12.0–15.0)
MCH: 32.9 pg (ref 26.0–34.0)
MCHC: 36.9 g/dL — ABNORMAL HIGH (ref 30.0–36.0)
MCV: 89.3 fL (ref 80.0–100.0)
Platelets: 192 10*3/uL (ref 150–400)
RBC: 4.13 MIL/uL (ref 3.87–5.11)
RDW: 12.7 % (ref 11.5–15.5)
WBC: 13.4 10*3/uL — ABNORMAL HIGH (ref 4.0–10.5)
nRBC: 0 % (ref 0.0–0.2)

## 2022-09-10 LAB — CREATININE, SERUM: Creatinine, Ser: <0.3 mg/dL — ABNORMAL LOW (ref 0.44–1.00)

## 2022-09-10 MED ORDER — LORAZEPAM 0.5 MG PO TABS
0.5000 mg | ORAL_TABLET | Freq: Three times a day (TID) | ORAL | Status: DC | PRN
  Administered 2022-09-10 – 2022-09-11 (×3): 0.5 mg via ORAL
  Filled 2022-09-10 (×3): qty 1

## 2022-09-10 MED ORDER — ENOXAPARIN SODIUM 30 MG/0.3ML IJ SOSY
30.0000 mg | PREFILLED_SYRINGE | Freq: Two times a day (BID) | INTRAMUSCULAR | Status: DC
  Administered 2022-09-10: 30 mg via SUBCUTANEOUS
  Filled 2022-09-10: qty 0.3

## 2022-09-10 MED ORDER — OXYCODONE HCL 5 MG PO TABS: 2.5000 mg | ORAL_TABLET | ORAL | Status: DC | PRN

## 2022-09-10 MED ORDER — ENOXAPARIN SODIUM 30 MG/0.3ML IJ SOSY
30.0000 mg | PREFILLED_SYRINGE | INTRAMUSCULAR | Status: DC
  Administered 2022-09-11 – 2022-09-13 (×3): 30 mg via SUBCUTANEOUS
  Filled 2022-09-10 (×3): qty 0.3

## 2022-09-10 NOTE — Plan of Care (Signed)

## 2022-09-10 NOTE — Progress Notes (Signed)
Patient felt like she was "Floating" above her bed, when she was awake. Staff went in her room to reassure her she was still in the bed. Her vitals signs were WNL and her Blood sugar was also.She wanted to hold off on taking any pain med but tylenol which she received this am., because she felt that was a contributing factor. She states her pain is a 4. Patient wanted to wait until her main MD came in to address this .

## 2022-09-10 NOTE — Plan of Care (Signed)
  Problem: Education: Goal: Ability to verbalize activity precautions or restrictions will improve Outcome: Progressing Goal: Knowledge of the prescribed therapeutic regimen will improve Outcome: Progressing Goal: Understanding of discharge needs will improve Outcome: Progressing   Problem: Activity: Goal: Ability to avoid complications of mobility impairment will improve Outcome: Progressing Goal: Will remain free from falls Outcome: Progressing   Problem: Bowel/Gastric: Goal: Gastrointestinal status for postoperative course will improve Outcome: Progressing   Problem: Clinical Measurements: Goal: Ability to maintain clinical measurements within normal limits will improve Outcome: Progressing Goal: Postoperative complications will be avoided or minimized Outcome: Progressing   Problem: Pain Management: Goal: Pain level will decrease Outcome: Progressing   Problem: Skin Integrity: Goal: Will show signs of wound healing Outcome: Progressing   Problem: Safety: Goal: Ability to remain free from injury will improve Outcome: Progressing

## 2022-09-10 NOTE — Progress Notes (Signed)
   Providing Compassionate, Quality Care - Together   Subjective: Patient reports "hallucinations" following pain medication administration. She felt like she was going to fall out of her bed even though she was lying flat. She requests we reduce the pain medication to 1/2-1 oxycodone 5 mg, which was the dose she was taking at home.  Objective: Vital signs in last 24 hours: Temp:  [97.6 F (36.4 C)-98.5 F (36.9 C)] 97.6 F (36.4 C) (01/14 0819) Pulse Rate:  [95-105] 95 (01/14 0819) Resp:  [15-18] 18 (01/14 0819) BP: (145-164)/(76-84) 145/76 (01/14 0819) SpO2:  [96 %-98 %] 98 % (01/14 0819)  Intake/Output from previous day: 01/13 0701 - 01/14 0700 In: 3648.1 [P.O.:240; I.V.:3408.1] Out: 730 [Urine:700; Drains:30] Intake/Output this shift: No intake/output data recorded.  Alert and oriented x 4 PERRLA CN II-XII grossly intact MAE, 4/5 grip bilaterally, 4+/5 BUE Incision is covered with gauze dressing and Steri Strips; Dressing is clean, dry, and intact  Lab Results: No results for input(s): "WBC", "HGB", "HCT", "PLT" in the last 72 hours. BMET No results for input(s): "NA", "K", "CL", "CO2", "GLUCOSE", "BUN", "CREATININE", "CALCIUM" in the last 72 hours.  Studies/Results: No results found.  Assessment/Plan: Patient underwent C4-5, C5-6, C6-7 posterior arthrodesis by Dr. Marcello Moores on 09/06/2022. She is progressing well. The recommendation is for AIR, which will hopefully happen on Monday pending bed availability and insurance authorization.   LOS: 6 days   -Patient would still like to move forward with discharging to CIR when approved. -Mobilize patient. -Will start Lovenox today per Dr. Manon Hilding 09/08/2022 note.   Viona Gilmore, DNP, AGNP-C Nurse Practitioner  [facility] [place] [address], [address], [place], [place] [address] P: [phone]    F: [phone]  09/10/2022, 8:33 AM

## 2022-09-11 ENCOUNTER — Ambulatory Visit: Payer: BC Managed Care – PPO | Admitting: Radiation Oncology

## 2022-09-11 ENCOUNTER — Ambulatory Visit: Payer: BC Managed Care – PPO

## 2022-09-11 DIAGNOSIS — R202 Paresthesia of skin: Secondary | ICD-10-CM | POA: Diagnosis not present

## 2022-09-11 MED ORDER — ORAL CARE MOUTH RINSE: 15.0000 mL | OROMUCOSAL | Status: DC | PRN

## 2022-09-11 NOTE — Progress Notes (Signed)
START ON PATHWAY REGIMEN - Breast     Cycle 1: A cycle is 21 days:     Pertuzumab      Trastuzumab-xxxx      Paclitaxel    Cycles 2 through 8: A cycle is every 21 days:     Pertuzumab      Trastuzumab-xxxx      Paclitaxel    Cycles 9 and beyond: A cycle is every 21 days:     Pertuzumab      Trastuzumab-xxxx   **Always confirm dose/schedule in your pharmacy ordering system**  Patient Characteristics: Distant Metastases or Locoregional Recurrent Disease - Unresected or Locally Advanced Unresectable Disease Progressing after Neoadjuvant and Local Therapies, HER2 Positive, ER Negative, Chemotherapy, First Line Therapeutic Status: Distant Metastases HER2 Status: Positive (+) ER Status: Negative (-) PR Status: Negative (-) Line of Therapy: First Line Intent of Therapy: Non-Curative / Palliative Intent, Discussed with Patient

## 2022-09-11 NOTE — Progress Notes (Signed)
Physical Therapy Treatment Patient Details Name: Martha [name] MRN: [id number] DOB: 1961-01-23 Today's Date: 09/11/2022   History of Present Illness 62 yo F with cervical cord compression with myelopathy associated with widely metastatic breast cancer. Now s/p C1-C7 posterior fusion with decompression 1/10. Pt with known brain and spinal mets. PMH of anxiety.    PT Comments    Pt admitted with above diagnosis. Pt was able to ambulate a short distance in the room with RW with mod assist of 2 due to need for chair follow for safety as pt is ataxic and has overall poor coordination of LEs and trunk stability. Also pt stood at sink to brush teeth.  See note for full treatment session. Pt is progressing.  Pt currently with functional limitations due to balance and endurance deficits. Pt will benefit from skilled PT to increase their independence and safety with mobility to allow discharge to the venue listed below.      Recommendations for follow up therapy are one component of a multi-disciplinary discharge planning process, led by the attending physician.  Recommendations may be updated based on patient status, additional functional criteria and insurance authorization.  Follow Up Recommendations  Acute inpatient rehab (3hours/day)     Assistance Recommended at Discharge Frequent or constant Supervision/Assistance  Patient can return home with the following Assistance with cooking/housework;Assist for transportation;Help with stairs or ramp for entrance;A lot of help with walking and/or transfers;A lot of help with bathing/dressing/bathroom   Equipment Recommendations  Other (comment) (TBA)    Recommendations for Other Services Rehab consult     Precautions / Restrictions Precautions Precautions: Cervical;Fall Precaution Booklet Issued: No Precaution Comments: Reviewed handout and pt was cued for precautions during functional mobility. Required Braces or Orthoses: Cervical  Brace Cervical Brace: Hard collar Restrictions Weight Bearing Restrictions: No     Mobility  Bed Mobility Overal bed mobility: Needs Assistance Bed Mobility: Rolling, Sidelying to Sit Rolling: Supervision Sidelying to sit: Min assist, HOB elevated       General bed mobility comments: assist to elevate trunk.  Needed to use pads to scoot pt to EOB. Pt's purewick leaked and her gown and bed were soaked. Got washclothes and cleaned pt and changed linen.    Transfers Overall transfer level: Needs assistance Equipment used: Rolling walker (2 wheels) Transfers: Sit to/from Stand Sit to Stand: Mod assist, Min assist   Step pivot transfers: Mod assist       General transfer comment: assist to rise and steady, posterior bias, VCs for hand placement and to attempt to control descent    Ambulation/Gait Ambulation/Gait assistance: Mod assist, +2 safety/equipment, +2 physical assistance Gait Distance (Feet): 12 Feet Assistive device: Rolling walker (2 wheels) Gait Pattern/deviations: Step-through pattern, Decreased stride length, Trunk flexed, Wide base of support, Staggering left, Staggering right, Leaning posteriorly, Ataxic, Knee hyperextension - right, Knee hyperextension - left Gait velocity: Decreased Gait velocity interpretation: <1.31 ft/sec, indicative of household ambulator   General Gait Details: Pt with very ataxic gait with unequal step length bil feet.  Pt with uncoordinated steps as well. Pt with posterior lean at times but better than last treatment overall.  Pt with overall poor postural control needing mod assist in standing for balance and definite need for RW.  Needed chair follow for safety. After her walk, brought pt to sink and she stood to brush her teeth. then she needed 3N1 for BM and she stood and we brought 3N1 behind pt and she had BM.  Mod assist  to steady to clean pt as well.  Poor control of descent to chair.   Stairs             Wheelchair  Mobility    Modified Rankin (Stroke Patients Only)       Balance Overall balance assessment: Needs assistance Sitting-balance support: No upper extremity supported, Feet supported Sitting balance-Leahy Scale: Fair Sitting balance - Comments: min guard at  EOB during bathing and dressing   Standing balance support: During functional activity, Bilateral upper extremity supported Standing balance-Leahy Scale: Poor Standing balance comment: stabilizes trunk and one hand on sink during grooming. Pt leans forward onto sink and widens BOS for balance.  Uncoordinated in trunk.                            Cognition Arousal/Alertness: Awake/alert Behavior During Therapy: WFL for tasks assessed/performed Overall Cognitive Status: Within Functional Limits for tasks assessed                                 General Comments: pt eager to participate        Exercises General Exercises - Lower Extremity Ankle Circles/Pumps: AROM, Both, 10 reps, Supine Long Arc Quad: AROM, Both, 10 reps, Seated Hip Flexion/Marching: AROM, Both, 10 reps, Seated    General Comments        Pertinent Vitals/Pain Pain Assessment Pain Assessment: Faces Faces Pain Scale: Hurts little more Pain Location: L side, shoulders Pain Descriptors / Indicators: Grimacing, Guarding, Discomfort Pain Intervention(s): Limited activity within patient's tolerance, Monitored during session, Repositioned, Premedicated before session    Home Living                          Prior Function            PT Goals (current goals can now be found in the care plan section) Acute Rehab PT Goals Patient Stated Goal: Return home with family Progress towards PT goals: Progressing toward goals    Frequency    Min 5X/week      PT Plan Current plan remains appropriate    Co-evaluation PT/OT/SLP Co-Evaluation/Treatment: Yes Reason for Co-Treatment: Complexity of the patient's impairments  (multi-system involvement);For patient/therapist safety PT goals addressed during session: Mobility/safety with mobility OT goals addressed during session: ADL's and self-care      AM-PAC PT "6 Clicks" Mobility   Outcome Measure  Help needed turning from your back to your side while in a flat bed without using bedrails?: A Little Help needed moving from lying on your back to sitting on the side of a flat bed without using bedrails?: A Little Help needed moving to and from a bed to a chair (including a wheelchair)?: A Lot Help needed standing up from a chair using your arms (e.g., wheelchair or bedside chair)?: A Lot Help needed to walk in hospital room?: Total Help needed climbing 3-5 steps with a railing? : Total 6 Click Score: 12    End of Session Equipment Utilized During Treatment: Gait belt;Cervical collar Activity Tolerance: Patient limited by fatigue;Patient limited by pain Patient left: with call bell/phone within reach;in chair Nurse Communication: Mobility status;Other (comment) (use Stedy to get pt back; purewick needed) PT Visit Diagnosis: Unsteadiness on feet (R26.81);Pain Pain - Right/Left:  (bilateral) Pain - part of body: Arm (neck)     Time: 3220-2542 PT Time  Calculation (min) (ACUTE ONLY): 28 min  Charges:  $Gait Training: 8-22 mins                     Urijah Raynor M,PT Acute Rehab Services [place] 09/11/2022, 10:11 AM

## 2022-09-11 NOTE — Progress Notes (Signed)
Occupational Therapy Treatment Patient Details Name: Martha [name] MRN: [id number] DOB: Feb 13, 1961 Today's Date: 09/11/2022   History of present illness 62 yo F with cervical cord compression with myelopathy associated with widely metastatic breast cancer. Now s/p C1-C7 posterior fusion with decompression 1/10. Pt with known brain and spinal mets. PMH of anxiety.   OT comments  Pt eager to work with therapies, reports sleeping well. Purewick failed, pt in urine soaked bed. Min assist for bed mobility. Mod to max assist for bathing, min assist to change gown at EOB. Pt ambulated with + 2 mod assist, RW and close chair follow. Able to stand at sink with min assist for one grooming activity, stabilizing with one hand and trunk on sink, chair behind pt for safety. Sat on BSC for BM, pt with sensation she needed to go, but unable to identify when she was finished. Total assist for pericare. Continues to be an excellent rehab candidate.    Recommendations for follow up therapy are one component of a multi-disciplinary discharge planning process, led by the attending physician.  Recommendations may be updated based on patient status, additional functional criteria and insurance authorization.    Follow Up Recommendations  Acute inpatient rehab (3hours/day)     Assistance Recommended at Discharge Frequent or constant Supervision/Assistance  Patient can return home with the following  A lot of help with walking and/or transfers;A lot of help with bathing/dressing/bathroom;Assistance with cooking/housework;Assistance with feeding;Direct supervision/assist for medications management;Direct supervision/assist for financial management;Assist for transportation;Help with stairs or ramp for entrance   Equipment Recommendations  Other (comment) (defer to next venue)    Recommendations for Other Services Rehab consult    Precautions / Restrictions Precautions Precautions: Cervical;Fall Required  Braces or Orthoses: Cervical Brace Cervical Brace: Hard collar       Mobility Bed Mobility Overal bed mobility: Needs Assistance Bed Mobility: Rolling, Sidelying to Sit Rolling: Supervision Sidelying to sit: Min assist, HOB elevated       General bed mobility comments: assist to elevate trunk\    Transfers Overall transfer level: Needs assistance Equipment used: Rolling walker (2 wheels) Transfers: Sit to/from Stand Sit to Stand: Mod assist, Min assist           General transfer comment: assist to rise and steady, posterior bias, VCs for hand placement and to attempt to control descent     Balance Overall balance assessment: Needs assistance Sitting-balance support: No upper extremity supported, Feet supported Sitting balance-Leahy Scale: Fair Sitting balance - Comments: min guard at  EOB during bathing and dressing     Standing balance-Leahy Scale: Poor Standing balance comment: stabilizes trunk and one hand on sink during grooming                           ADL either performed or assessed with clinical judgement   ADL Overall ADL's : Needs assistance/impaired Eating/Feeding: Set up;Sitting;Bed level Eating/Feeding Details (indicate cue type and reason): self fed banana peeled for her, drinks with cup with straw and 2 hands Grooming: Oral care;Brushing hair;Wash/dry face;Sitting;Standing Grooming Details (indicate cue type and reason): pt set up toothbrush in sitting, stood for oral care, sat for brushing hair and washing face, assist to reach back of head Upper Body Bathing: Moderate assistance;Sitting   Lower Body Bathing: Total assistance;Sit to/from stand   Upper Body Dressing : Sitting;Minimal assistance       Toilet Transfer: Minimal assistance;BSC/3in1 Toilet Transfer Details (indicate cue type and reason):  pt with sensation of need to have BM, incontinent, but finished on [facility], unaware when she was done Toileting- Water quality scientist and  Hygiene: Total assistance;Sit to/from stand       Functional mobility during ADLs: Moderate assistance;Rolling walker (2 wheels);+2 for safety/equipment      Extremity/Trunk Assessment              Vision       Perception     Praxis      Cognition Arousal/Alertness: Awake/alert Behavior During Therapy: WFL for tasks assessed/performed Overall Cognitive Status: Within Functional Limits for tasks assessed                                 General Comments: pt eager to participate        Exercises      Shoulder Instructions       General Comments      Pertinent Vitals/ Pain       Pain Assessment Pain Assessment: Faces Faces Pain Scale: Hurts little more Pain Location: L side, shoulders Pain Descriptors / Indicators: Grimacing, Guarding, Discomfort Pain Intervention(s): Monitored during session, Repositioned  Home Living                                          Prior Functioning/Environment              Frequency  Min 3X/week        Progress Toward Goals  OT Goals(current goals can now be found in the care plan section)  Progress towards OT goals: Progressing toward goals  Acute Rehab OT Goals OT Goal Formulation: With patient Time For Goal Achievement: 09/21/22 Potential to Achieve Goals: Good  Plan Discharge plan remains appropriate    Co-evaluation    PT/OT/SLP Co-Evaluation/Treatment: Yes Reason for Co-Treatment: For patient/therapist safety   OT goals addressed during session: ADL's and self-care      AM-PAC OT "6 Clicks" Daily Activity     Outcome Measure   Help from another person eating meals?: A Little Help from another person taking care of personal grooming?: A Little Help from another person toileting, which includes using toliet, bedpan, or urinal?: A Lot Help from another person bathing (including washing, rinsing, drying)?: A Lot Help from another person to put on and taking off  regular upper body clothing?: A Lot Help from another person to put on and taking off regular lower body clothing?: A Lot 6 Click Score: 14    End of Session Equipment Utilized During Treatment: Gait belt;Rolling walker (2 wheels);Cervical collar  OT Visit Diagnosis: Unsteadiness on feet (R26.81);Other abnormalities of gait and mobility (R26.89);Pain   Activity Tolerance Patient tolerated treatment well   Patient Left in chair;with call bell/phone within reach   Nurse Communication          Time: 9470-9628 OT Time Calculation (min): 28 min  Charges: OT General Charges $OT Visit: 1 Visit OT Treatments $Self Care/Home Management : 8-22 mins  Cleta Alberts, OTR/L Acute Rehabilitation Services Office: [phone]   Malka So 09/11/2022, 9:19 AM

## 2022-09-11 NOTE — Plan of Care (Signed)

## 2022-09-11 NOTE — Progress Notes (Signed)
Melessa Cowell   DOB:1961-07-06   OI#:[id number]   [phone]  Medical oncology follow up note   Subjective: Patient is well-known to me, she was recently admitted to progressive lower extremity weakness.  She underwent cervical spine posterior arthrodesis by Dr. Marcello Moores on September 06, 2022.  He is recovering well, she feels her overall strength has improved, she was able to walk with physical therapist today.  She tells me that if she is going to most, inpatient rehab soon, for possible 2 weeks.  Her husband is at the bedside.   Objective:  Vitals:   09/11/22 0831 09/11/22 2007  BP: (!) 142/82 (!) 154/73  Pulse: (!) 102 91  Resp: 17   Temp: 98.7 F (37.1 C) 98.7 F (37.1 C)  SpO2: 97% 94%    Body mass index is 17.18 kg/m.  Intake/Output Summary (Last 24 hours) at 09/11/2022 2009 Last data filed at 09/11/2022 0818 Gross per 24 hour  Intake 120 ml  Output 1600 ml  Net -1480 ml     Sclerae unicteric  Is able to move all extremities, no peripheral edema  Neuro nonfocal   CBG (last 3)  Recent Labs    09/09/22 2115  GLUCAP 132*     Labs:   Urine Studies No results for input(s): "UHGB", "CRYS" in the last 72 hours.  Invalid input(s): "UACOL", "UAPR", "USPG", "UPH", "UTP", "UGL", "UKET", "UBIL", "UNIT", "UROB", "ULEU", "UEPI", "UWBC", "URBC", "UBAC", "CAST", "UCOM", "BILUA"  Basic Metabolic Panel: Recent Labs  Lab 09/05/22 0646 09/10/22 1056  NA 134*  --   K 3.7  --   CL 101  --   CO2 24  --   GLUCOSE 94  --   BUN 20  --   CREATININE 0.45 <0.30*  CALCIUM 9.2  --    GFR CrCl cannot be calculated (This lab value cannot be used to calculate CrCl because it is not a number: <0.30). Liver Function Tests: Recent Labs  Lab 09/05/22 0646  AST 43*  ALT 36  ALKPHOS 721*  BILITOT 0.5  PROT 5.7*  ALBUMIN 3.0*   No results for input(s): "LIPASE", "AMYLASE" in the last 168 hours. No results for input(s): "AMMONIA" in the last 168 hours. Coagulation  profile No results for input(s): "INR", "PROTIME" in the last 168 hours.  CBC: Recent Labs  Lab 09/05/22 0646 09/10/22 1056  WBC 6.2 13.4*  HGB 14.1 13.6  HCT 43.4 36.9  MCV 96.9 89.3  PLT 227 192   Cardiac Enzymes: No results for input(s): "CKTOTAL", "CKMB", "CKMBINDEX", "TROPONINI" in the last 168 hours. BNP: Invalid input(s): "POCBNP" CBG: Recent Labs  Lab 09/09/22 2115  GLUCAP 132*   D-Dimer No results for input(s): "DDIMER" in the last 72 hours. Hgb A1c No results for input(s): "HGBA1C" in the last 72 hours. Lipid Profile No results for input(s): "CHOL", "HDL", "LDLCALC", "TRIG", "CHOLHDL", "LDLDIRECT" in the last 72 hours. Thyroid function studies No results for input(s): "TSH", "T4TOTAL", "T3FREE", "THYROIDAB" in the last 72 hours.  Invalid input(s): "FREET3" Anemia work up No results for input(s): "VITAMINB12", "FOLATE", "FERRITIN", "TIBC", "IRON", "RETICCTPCT" in the last 72 hours. Microbiology Recent Results (from the past 240 hour(s))  Surgical PCR screen     Status: None   Collection Time: 09/06/22  2:35 AM   Specimen: Nasal Mucosa; Nasal Swab  Result Value Ref Range Status   MRSA, PCR NEGATIVE NEGATIVE Final   Staphylococcus aureus NEGATIVE NEGATIVE Final    Comment: (NOTE) The Xpert SA Assay (FDA  approved for NASAL specimens in patients 25 years of age and older), is one component of a comprehensive surveillance program. It is not intended to diagnose infection nor to guide or monitor treatment. Performed at [facility], [place] [address], [place], [place] [address]       Studies:  No results found.  Assessment: 62 y.o. female   Diffuse metastatic breast cancer to spine, s/p recent cervical spinal decompression surgery 1/10 Metastatic breast cancer, ER-/HR-/HER2+ Hypothyroidism   Plan:  -I discussed her surgical pathology findings from August 17, 2022, the cervical mass biopsy showed metastatic breast cancer, ER and  PR negative, HER2 positive.  This is a different type of breast cancer from her previous breast cancer, although sometimes hormonal receptor and HER2 status can change when breast cancer recur. -I encouraged her to participate rehab, she will need some time to heal from her recent second cervical spine surgery. -Discussed systemic treatment options, I recommended first-line with weekly Taxol, trastuzumab and Perjeta.  Benefit and potential side effects discussed with her and her husband.  She is interested and plan to start in 2 to 3 weeks when she recovers well from surgery. -I will discuss with Dr. Isidore Moos if radiation is stillplanned.  -I will see her back after her hospital discharge.    Truitt Merle, MD 09/11/2022

## 2022-09-11 NOTE — Progress Notes (Signed)
Inpatient Rehab Admissions Coordinator:   Awaiting determination from [facility].  Will continue to follow.    Shann Medal, PT, DPT Admissions Coordinator [phone] 09/11/22  8:47 AM

## 2022-09-12 ENCOUNTER — Other Ambulatory Visit: Payer: Self-pay

## 2022-09-12 ENCOUNTER — Ambulatory Visit: Payer: BC Managed Care – PPO

## 2022-09-12 NOTE — PMR Pre-admission (Signed)
PMR Admission Coordinator Pre-Admission Assessment  Patient: Martha [name] is an 62 y.o., female MRN: [id number] DOB: [birth year] Height: '5\' 3"'$  (160 cm) Weight: 44 kg  Insurance Information HMO:     PPO: yes     PCP:      IPA:      80/20:      OTHER:  PRIMARY: Mollie Germany      Policy#: [id number]      Subscriber: pt CM Name: n/a      Phone#: n/a     Fax#: [phone] Pre-Cert#: [id number] auth for CIR via faxed approval (no phone number contact provided) with updates due to fax listed above on 1/22      Employer: n/a Benefits:  Phone #: [phone]     Name:  Eff. Date: 08/28/20     Deduct: $4000 (met $233)      Out of Pocket Max: $4000 (met $221)      Life Max: n/a CIR: 80%      SNF: 20% Outpatient: 80%     Co-Ins: 20% Home Health: 80%      Co-Ins: 20% DME: 80%     Co-Ins: 20% Providers:  SECONDARY:       Policy#:      Phone#:   Development worker, community:       Phone#:   The Therapist, art Information Summary" for patients in Inpatient Rehabilitation Facilities with attached "Privacy Act [place] Records" was provided and verbally reviewed with: N/A  Emergency Contact Information Contact Information     Name Relation Home Work Mobile   [place] Spouse [phone]  [phone]       Current Medical History  Patient Admitting Diagnosis: myelopathy 2/2 metastatic cord compression   History of Present Illness: Pt is a 62 y/o with PMH of anxiety, breast cancer, and known brain and spinal mets, pathological spinal fx s/p posterior fusion of C1-4 in December 2023, who admitted to [facility] on 09/04/22 with c/o BLE weakness which had worsened significantly over a period of 2 days with associated decreased sensation from the waist down.  Vitals and labs in ED relatively unremarkable.  Imaging revealed widespread spine and osseous disease, visceral disease in liver, and possibly in lungs, with spinal stenosis at C5-6 levels.  Neurosurgery consulted and  recommended dexamethasone taper and offered option of stabilization surgery to preserve neurological function.  Oncology and radiation oncology also consulted.  Pathology on cervical mass biopsy returned showing metastatic breast cancer which was different from her previous type of breast cancer.  Both oncology and radiation oncology recommend recovery from surgery, including rehab, prior to initiating treatment for breast cancer.  Therapy ongoing and pt was recommended for CIR.     [facility]'s [facility] record from [name] has been reviewed by the rehabilitation admission coordinator and physician.  Past Medical History  Past Medical History:  Diagnosis Date   Anxiety    Cancer ([place]) 2022   right breast LCIS   Cancer ([place]) 2019   left breast   Depression    Dysrhythmia    SVT, s/p ablation ~ 2012 in at [facility]   Ectopic pregnancy    Family history of breast cancer    Family history of melanoma    History of radiation therapy 04/22/18-06/03/18   Left Breast, left SCV, axilla 50 Gy in 25 fractions, Left breast boost 10 Gy in 5 fractions.    Hypothyroidism    Personal history of radiation therapy    PONV (postoperative nausea  and vomiting)    Thyroid disease     Has the patient had major surgery during 100 days prior to admission? Yes  Family History   family history includes Breast cancer in her cousin and other family members; Dementia in her paternal grandmother; Heart disease in her mother; Hyperlipidemia in her sister; Lung cancer in her maternal uncle; Lung disease in her paternal grandfather; Melanoma (age of onset: 68) in her sister; Pneumonia in her father and maternal uncle.  Current Medications  Current Facility-Administered Medications:    0.9 %  sodium chloride infusion, 250 mL, Intravenous, Continuous, Vallarie Mare, MD   0.9 % NaCl with KCl 20 mEq/ L  infusion, , Intravenous, Continuous, Vallarie Mare, MD, Stopped at 09/11/22 [phone]    acetaminophen (TYLENOL) tablet 650 mg, 650 mg, Oral, Q4H PRN, 650 mg at 09/12/22 1021 **OR** acetaminophen (TYLENOL) suppository 650 mg, 650 mg, Rectal, Q4H PRN, Vallarie Mare, MD   citalopram (CELEXA) tablet 20 mg, 20 mg, Oral, Daily, Vallarie Mare, MD, 20 mg at 09/12/22 0843   cyclobenzaprine (FLEXERIL) tablet 10 mg, 10 mg, Oral, TID PRN, Vallarie Mare, MD, 10 mg at 09/06/22 0956   docusate sodium (COLACE) capsule 100 mg, 100 mg, Oral, BID, Vallarie Mare, MD, 100 mg at 09/12/22 0845   enoxaparin (LOVENOX) injection 30 mg, 30 mg, Subcutaneous, Q24H, Bergman, Meghan D, NP, 30 mg at 09/12/22 0842   gabapentin (NEURONTIN) capsule 100 mg, 100 mg, Oral, TID, Vallarie Mare, MD, 100 mg at 09/12/22 0843   letrozole [facility]) tablet 2.5 mg, 2.5 mg, Oral, Daily, Vallarie Mare, MD, 2.5 mg at 09/12/22 0846   levothyroxine (SYNTHROID) tablet 75 mcg, 75 mcg, Oral, Q0600, Vallarie Mare, MD, 75 mcg at 09/12/22 0544   LORazepam (ATIVAN) tablet 0.5 mg, 0.5 mg, Oral, TID PRN, Collene Schlichter, PA-C, 0.5 mg at 09/11/22 4562   menthol-cetylpyridinium (CEPACOL) lozenge 3 mg, 1 lozenge, Oral, PRN **OR** phenol (CHLORASEPTIC) mouth spray 1 spray, 1 spray, Mouth/Throat, PRN, Vallarie Mare, MD   ondansetron [facility]) tablet 4 mg, 4 mg, Oral, Q6H PRN **OR** ondansetron (ZOFRAN) injection 4 mg, 4 mg, Intravenous, Q6H PRN, Vallarie Mare, MD   Oral care mouth rinse, 15 mL, Mouth Rinse, PRN, Vallarie Mare, MD   oxyCODONE (Oxy IR/ROXICODONE) immediate release tablet 2.5-5 mg, 2.5-5 mg, Oral, Q3H PRN, Reinaldo Meeker, Meghan D, NP   polyethylene glycol (MIRALAX / GLYCOLAX) packet 17 g, 17 g, Oral, Daily PRN, Vallarie Mare, MD, 17 g at 09/08/22 2058   senna-docusate (Senokot-S) tablet 1 tablet, 1 tablet, Oral, QHS, Vallarie Mare, MD, 1 tablet at 09/11/22 2123   sodium chloride flush (NS) 0.9 % injection 3 mL, 3 mL, Intravenous, Q12H, Vallarie Mare, MD, 3 mL at 09/12/22 0848    sodium chloride flush (NS) 0.9 % injection 3 mL, 3 mL, Intravenous, PRN, Vallarie Mare, MD  Patients Current Diet:  Diet Order             Diet regular Room service appropriate? Yes; Fluid consistency: Thin  Diet effective now                   Precautions / Restrictions Precautions Precautions: Cervical, Fall Precaution Booklet Issued: No Precaution Comments: Reviewed handout and pt was cued for precautions during functional mobility. Cervical Brace: Hard collar Restrictions Weight Bearing Restrictions: No   Has the patient had 2 or more falls or a fall with injury in the  past year? Yes  Prior Activity Level Community (5-7x/wk): prior to december was independent without device, driving; since surgery in dec has been using DME and not driving  Prior Functional Level Self Care: Did the patient need help bathing, dressing, using the toilet or eating? Independent  Indoor Mobility: Did the patient need assistance with walking from room to room (with or without device)? Independent  Stairs: Did the patient need assistance with internal or external stairs (with or without device)? Independent  Functional Cognition: Did the patient need help planning regular tasks such as shopping or remembering to take medications? Independent  Patient Information Are you of Hispanic, Latino/a,or Spanish origin?: A. No, not of Hispanic, Latino/a, or Spanish origin What is your race?: A. White Do you need or want an interpreter to communicate with a doctor or health care staff?: 0. No  Patient's Response To:  Health Literacy and Transportation Is the patient able to respond to health literacy and transportation needs?: Yes Health Literacy - How often do you need to have someone help you when you read instructions, pamphlets, or other written material from your doctor or pharmacy?: Never In the past 12 months, has lack of transportation kept you from medical appointments or from getting  medications?: No In the past 12 months, has lack of transportation kept you from meetings, work, or from getting things needed for daily living?: No  Development worker, international aid / [place] Devices/Equipment: Wheelchair Home Equipment: Rollator (4 wheels), Conservation officer, nature (2 wheels), Wheelchair - manual, Shower seat  Prior Device Use: Indicate devices/aids used by the patient prior to current illness, exacerbation or injury?  Most recently using RW or w/c for mobility but prior to December did not require DME  Current Functional Level Cognition  Overall Cognitive Status: Within Functional Limits for tasks assessed Orientation Level: Oriented X4 General Comments: Anxious but very eager to participate and improve    Extremity Assessment (includes Sensation/Coordination)  Upper Extremity Assessment: Defer to OT evaluation RUE Deficits / Details: R shoulder flexion severely limited- 20 degrees actively. RUE: Shoulder pain with ROM RUE Sensation: decreased light touch RUE Coordination: decreased fine motor, decreased gross motor LUE Deficits / Details: Flexion severely limited- about 40 degrees LUE: Shoulder pain with ROM LUE Sensation: decreased light touch LUE Coordination: decreased fine motor, decreased gross motor  Lower Extremity Assessment: RLE deficits/detail, LLE deficits/detail RLE Deficits / Details: unable to fully assess due to pain LLE Deficits / Details: unable to fully assess due to pain (    ADLs  Overall ADL's : Needs assistance/impaired Eating/Feeding: Set up, Sitting, Bed level Eating/Feeding Details (indicate cue type and reason): self fed banana peeled for her, drinks with cup with straw and 2 hands Grooming: Oral care, Brushing hair, Wash/dry face, Sitting, Standing Grooming Details (indicate cue type and reason): pt set up toothbrush in sitting, stood for oral care, sat for brushing hair and washing face, assist to reach back of head Upper Body Bathing:  Moderate assistance, Sitting Lower Body Bathing: Total assistance, Sit to/from stand Upper Body Dressing : Sitting, Minimal assistance Lower Body Dressing: Moderate assistance, Sit to/from stand Toilet Transfer: Minimal assistance, BSC/3in1 Toilet Transfer Details (indicate cue type and reason): pt with sensation of need to have BM, incontinent, but finished on [facility], unaware when she was done Toileting- Water quality scientist and Hygiene: Total assistance, Sit to/from stand Functional mobility during ADLs: Moderate assistance, Rolling walker (2 wheels), +2 for safety/equipment General ADL Comments: Mod A overall with the RW for  ADL transfers. Good carryover of figure 4 dressing technique. Cueing for reducing knee hyperextension during stand.    Mobility  Overal bed mobility: Needs Assistance Bed Mobility: Rolling, Sidelying to Sit Rolling: Supervision Sidelying to sit: Min assist, HOB elevated Sit to sidelying: Min guard General bed mobility comments: Cues for log roll technique with min A for trunk    Transfers  Overall transfer level: Needs assistance Equipment used: Rolling walker (2 wheels) Transfers: Sit to/from Stand Sit to Stand: Mod assist, Min assist Bed to/from chair/wheelchair/BSC transfer type:: Stand pivot Step pivot transfers: Mod assist General transfer comment: Pt required min to mod A for multiple sit to stands.  Initially with posterior bias but then anterior.  With standing pt tending to use momentum to power up through initial phase and then pushing with legs.  Return to sitting similar - starts controlled and then required assist to control descent.  She has a range in which she is very weak and uses momentum to power up and then sits quickly.  Worked on at least 10 sit to stands during session with rest breaks with cues for gradual rise to strengthen (cues to make sure legs in good place, then therapy providing tactile cues to keeps knees apart and not internally rotate  hips).  With sitting worked on cueing for controlled descent and therapy provided tactile cues between knees to prevent internal rotation.  Pt has difficulty with controlled descent but showing improvement.    Ambulation / Gait / Stairs / Wheelchair Mobility  Ambulation/Gait Ambulation/Gait assistance: Mod assist, +2 safety/equipment, +2 physical assistance Gait Distance (Feet): 8 Feet Assistive device: Rolling walker (2 wheels) Gait Pattern/deviations: Step-to pattern, Ataxic, Wide base of support, Staggering right, Staggering left General Gait Details: Pt with very ataxic gait requiring cues for step length and assist with weight shifting.  Occasional assist to push RW forward.  Overall, mod A for balance but did provide with assist of 2 to improve pt's confidence/less fear of falling.  Had chair follow. Gait velocity: Decreased Gait velocity interpretation: <1.31 ft/sec, indicative of household ambulator    Posture / Balance Dynamic Sitting Balance Sitting balance - Comments: min guard at  EOB during bathing and dressing Balance Overall balance assessment: Needs assistance Sitting-balance support: No upper extremity supported, Feet supported Sitting balance-Leahy Scale: Fair Sitting balance - Comments: min guard at  EOB during bathing and dressing Standing balance support: During functional activity, Bilateral upper extremity supported Standing balance-Leahy Scale: Poor Standing balance comment: Pt with poor postural control in standing - first stand with posterior bias but subsequent stands with anterior bias. Requiring min-mod A for balance and RW.    Special needs/care consideration Skin surgical incisions and Special service needs oncology f/u as outpatient with Dr. Burr Medico   Previous Home Environment (from acute therapy documentation) Living Arrangements: Spouse/significant other Available Help at Discharge: Family, Available 24 hours/day Type of Home: House Home Layout: One  level Alternate Level Stairs-Rails: Right Alternate Level Stairs-Number of Steps: 1 step down into living room Home Access: Stairs to enter Entrance Stairs-Rails: Can reach both Entrance Stairs-Number of Steps: 2 Bathroom Shower/Tub: Curtain, Multimedia programmer: Standard Bathroom Accessibility: Yes How Accessible: Accessible via wheelchair, Accessible via Teller: No Additional Comments: adjustable bed  Discharge Living Setting Plans for Discharge Living Setting: Patient's home, Lives with (comment) (spouse [name])) Type of Home at Discharge: House Discharge Home Layout: One level (1 step down into living room with R handrail) Discharge Home Access: Stairs  to enter Entrance Stairs-Rails: Can reach both Entrance Stairs-Number of Steps: 2 Discharge Bathroom Shower/Tub: Walk-in shower Discharge Bathroom Toilet: Standard Discharge Bathroom Accessibility: Yes How Accessible: Accessible via wheelchair Does the patient have any problems obtaining your medications?: No  Social/Family/Support Systems Patient Roles: Spouse Anticipated Caregiver: spouse and other family Anticipated Caregiver's Contact Information: [name] Ability/Limitations of Caregiver: [phone] Caregiver Availability: Intermittent Discharge Plan Discussed with Primary Caregiver: Yes Is Caregiver In Agreement with Plan?: Yes Does Caregiver/Family have Issues with Lodging/Transportation while Pt is in Rehab?: No  Goals Patient/Family Goal for Rehab: PT/OT supervision to min assist, SLP n/a Expected length of stay: 12-14 days Additional Information: f/u with Dr. Burr Medico oncology Pt/Family Agrees to Admission and willing to participate: Yes Program Orientation Provided & Reviewed with Pt/Caregiver Including Roles  & Responsibilities: Yes Additional Information Needs: further oncology treatment to occur following recovery from surgery  Barriers to Discharge: Insurance for SNF coverage,  Decreased caregiver support  Decrease burden of Care through IP rehab admission: n/a  Possible need for SNF placement upon discharge: Not anticipated  Patient Condition: I have reviewed medical records from [facility], spoken with CM, and patient and spouse. I met with patient at the bedside for inpatient rehabilitation assessment.  Patient will benefit from ongoing PT and OT, can actively participate in 3 hours of therapy a day 5 days of the week, and can make measurable gains during the admission.  Patient will also benefit from the coordinated team approach during an Inpatient Acute Rehabilitation admission.  The patient will receive intensive therapy as well as Rehabilitation physician, nursing, social worker, and care management interventions.  Due to bladder management, bowel management, safety, skin/wound care, disease management, medication administration, pain management, and patient education the patient requires 24 hour a day rehabilitation nursing.  The patient is currently mod +2 with mobility and basic ADLs.  Discharge setting and therapy post discharge at home with home health is anticipated.  Patient has agreed to participate in the Acute Inpatient Rehabilitation Program and will admit today.  Preadmission Screen Completed By:  Michel Santee, PT, DPT 09/12/2022 3:56 PM ______________________________________________________________________   Discussed status with Dr. Curlene Dolphin on 09/13/22 at 9:58 AM  and received approval for admission today.  Admission Coordinator:  Michel Santee, PT, DPT time 9:58 AM Sudie Grumbling 09/13/22    Assessment/Plan: Diagnosis: Myelopathy, metastatic breast cancer to spine, s/p cervical spinal decompression 1/10 Does the need for close, 24 hr/day Medical supervision in concert with the patient's rehab needs make it unreasonable for this patient to be served in a less intensive setting? Yes Co-Morbidities requiring supervision/potential complications:  Hypothyroidism, nicotine dependence, pain Due to bladder management, bowel management, safety, skin/wound care, disease management, medication administration, pain management, and patient education, does the patient require 24 hr/day rehab nursing? Yes Does the patient require coordinated care of a physician, rehab nurse, PT, OT, and SLP to address physical and functional deficits in the context of the above medical diagnosis(es)? Yes Addressing deficits in the following areas: balance, endurance, locomotion, strength, transferring, bowel/bladder control, bathing, dressing, feeding, grooming, toileting, cognition, speech, language, swallowing, and psychosocial support Can the patient actively participate in an intensive therapy program of at least 3 hrs of therapy 5 days a week? Yes The potential for patient to make measurable gains while on inpatient rehab is excellent Anticipated functional outcomes upon discharge from inpatient rehab: supervision and min assist PT, supervision and min assist OT, n/a SLP Estimated rehab length of stay to reach the above functional  goals is: 12-14 Anticipated discharge destination: Home 10. Overall Rehab/Functional Prognosis: excellent   MD Signature: Jennye Boroughs

## 2022-09-12 NOTE — Progress Notes (Addendum)
Inpatient Rehab Admissions Coordinator:   Awaiting determination from [facility].  Have faxed updated clinicals 1/15 and 1/16. Updated spouse via phone and pt at bedside.   Shann Medal, PT, DPT Admissions Coordinator [phone] 09/12/22  9:45 AM

## 2022-09-12 NOTE — Progress Notes (Signed)
Physical Therapy Treatment Patient Details Name: Martha [name] MRN: [id number] DOB: Jul 08, 1961 Today's Date: 09/12/2022   History of Present Illness Pt is 62 yo F with cervical cord compression with myelopathy associated with widely metastatic breast cancer. Now s/p C1-C7 posterior fusion with decompression 1/10. Pt with known brain and spinal mets. PMH of anxiety.    PT Comments    Pt with gradual progress.  Does still have decreased postural control, ataxia, and significant weakness.  Pt requiring min-mod A for transfers and mod A of 2 to ambulate.  Session focused on balance, target tapping for ataxia in LE, and strengthening.  Pt with weak quads/gluteals and has difficulty controlling sit to stand depending on momentum to rise and uncontrolled descents - some improvement with practice, cues, making sure feet in good positions, and tactile cues to prevent hip IR and knees touching.  Continue to progress as able.  Pt very motivated.     Recommendations for follow up therapy are one component of a multi-disciplinary discharge planning process, led by the attending physician.  Recommendations may be updated based on patient status, additional functional criteria and insurance authorization.  Follow Up Recommendations  Acute inpatient rehab (3hours/day)     Assistance Recommended at Discharge Frequent or constant Supervision/Assistance  Patient can return home with the following Assistance with cooking/housework;Assist for transportation;Help with stairs or ramp for entrance;A lot of help with walking and/or transfers;A lot of help with bathing/dressing/bathroom   Equipment Recommendations  Other (comment) (TBD)    Recommendations for Other Services Rehab consult     Precautions / Restrictions Precautions Precautions: Cervical;Fall Precaution Booklet Issued: No Precaution Comments: Reviewed handout and pt was cued for precautions during functional mobility. Required Braces or  Orthoses: Cervical Brace Cervical Brace: Hard collar Restrictions Weight Bearing Restrictions: No     Mobility  Bed Mobility Overal bed mobility: Needs Assistance Bed Mobility: Rolling, Sidelying to Sit Rolling: Supervision Sidelying to sit: Min assist, HOB elevated       General bed mobility comments: Cues for log roll technique with min A for trunk    Transfers Overall transfer level: Needs assistance Equipment used: Rolling walker (2 wheels) Transfers: Sit to/from Stand Sit to Stand: Mod assist, Min assist           General transfer comment: Pt required min to mod A for multiple sit to stands.  Initially with posterior bias but then anterior.  With standing pt tending to use momentum to power up through initial phase and then pushing with legs.  Return to sitting similar - starts controlled and then required assist to control descent.  She has a range in which she is very weak and uses momentum to power up and then sits quickly.  Worked on at least 10 sit to stands during session with rest breaks with cues for gradual rise to strengthen (cues to make sure legs in good place, then therapy providing tactile cues to keeps knees apart and not internally rotate hips).  With sitting worked on cueing for controlled descent and therapy provided tactile cues between knees to prevent internal rotation.  Pt has difficulty with controlled descent but showing improvement.    Ambulation/Gait Ambulation/Gait assistance: Mod assist, +2 safety/equipment, +2 physical assistance Gait Distance (Feet): 8 Feet Assistive device: Rolling walker (2 wheels) Gait Pattern/deviations: Step-to pattern, Ataxic, Wide base of support, Staggering right, Staggering left Gait velocity: Decreased     General Gait Details: Pt with very ataxic gait requiring cues for step length  and assist with weight shifting.  Occasional assist to push RW forward.  Overall, mod A for balance but did provide with assist of 2 to  improve pt's confidence/less fear of falling.  Had chair follow.   Stairs             Wheelchair Mobility    Modified Rankin (Stroke Patients Only)       Balance Overall balance assessment: Needs assistance Sitting-balance support: No upper extremity supported, Feet supported Sitting balance-Leahy Scale: Fair     Standing balance support: During functional activity, Bilateral upper extremity supported Standing balance-Leahy Scale: Poor Standing balance comment: Pt with poor postural control in standing - first stand with posterior bias but subsequent stands with anterior bias. Requiring min-mod A for balance and RW.                            Cognition Arousal/Alertness: Awake/alert Behavior During Therapy: Anxious Overall Cognitive Status: Within Functional Limits for tasks assessed                                 General Comments: Anxious but very eager to participate and improve        Exercises Other Exercises Other Exercises: Target tapping bil LE in sitting x 20 (started fixed target progressed to varied positions); cues to focus on precision and tapping end of therapist toe for target Other Exercises: multiple sit to stands (see above); tried squats/mini squats 10x2 but pt with very limited motion - more benefit from sit to stands; pt asking about strengthening exercises in supine.  She can easily perform heel slides, SLR, ankle pumps - did discuss controlled motion.  Educated on low bridges keeping weight on back/shoulders and not into neck    General Comments        Pertinent Vitals/Pain Pain Assessment Pain Assessment: Faces Faces Pain Scale: Hurts a little bit Pain Location: L side, shoulders Pain Descriptors / Indicators: Guarding Pain Intervention(s): Limited activity within patient's tolerance, Monitored during session, Repositioned    Home Living                          Prior Function            PT  Goals (current goals can now be found in the care plan section) Progress towards PT goals: Progressing toward goals    Frequency    Min 5X/week      PT Plan Current plan remains appropriate    Co-evaluation              AM-PAC PT "6 Clicks" Mobility   Outcome Measure  Help needed turning from your back to your side while in a flat bed without using bedrails?: A Little Help needed moving from lying on your back to sitting on the side of a flat bed without using bedrails?: A Little Help needed moving to and from a bed to a chair (including a wheelchair)?: A Lot Help needed standing up from a chair using your arms (e.g., wheelchair or bedside chair)?: A Lot Help needed to walk in hospital room?: Total Help needed climbing 3-5 steps with a railing? : Total 6 Click Score: 12    End of Session Equipment Utilized During Treatment: Gait belt;Cervical collar Activity Tolerance: Patient limited by fatigue;Patient limited by pain Patient left: with call bell/phone within  reach;in chair (pt oriented, will not get up on her onw) Nurse Communication: Mobility status;Other (comment) (assist of 2 for safety for transfer) PT Visit Diagnosis: Unsteadiness on feet (R26.81);Pain Pain - Right/Left:  (bil) Pain - part of body: Arm     Time: 1403-1430 PT Time Calculation (min) (ACUTE ONLY): 27 min  Charges:  $Gait Training: 8-22 mins $Therapeutic Exercise: 8-22 mins                     Abran Richard, PT Acute Rehab [facility] [phone]    Karlton Lemon 09/12/2022, 3:57 PM

## 2022-09-12 NOTE — Progress Notes (Signed)
Neurosurgery   Patient seen and examined. No acute distress. Vital stable. Post neck, incision dressing, clean, dry and intact.  4/5 grip strength bilaterally, 4+/5 strength in lowers  S/p c1-7 pCDF for metastatic cord compression -awaiting rehab

## 2022-09-13 ENCOUNTER — Ambulatory Visit: Payer: BC Managed Care – PPO | Admitting: Hematology

## 2022-09-13 ENCOUNTER — Ambulatory Visit: Payer: BC Managed Care – PPO

## 2022-09-13 ENCOUNTER — Inpatient Hospital Stay (HOSPITAL_COMMUNITY): Payer: BC Managed Care – PPO

## 2022-09-13 ENCOUNTER — Other Ambulatory Visit: Payer: Self-pay

## 2022-09-13 ENCOUNTER — Telehealth: Payer: Self-pay | Admitting: Radiation Therapy

## 2022-09-13 ENCOUNTER — Encounter (HOSPITAL_COMMUNITY): Payer: Self-pay | Admitting: Physical Medicine and Rehabilitation

## 2022-09-13 ENCOUNTER — Inpatient Hospital Stay (HOSPITAL_COMMUNITY)
Admission: RE | Admit: 2022-09-13 | Discharge: 2022-09-27 | DRG: 052 | Disposition: A | Discharge status: Home / Self Care | Payer: BC Managed Care – PPO | Source: Intra-hospital | Attending: Physical Medicine and Rehabilitation | Admitting: Physical Medicine and Rehabilitation

## 2022-09-13 DIAGNOSIS — F419 Anxiety disorder, unspecified: Secondary | ICD-10-CM | POA: Diagnosis present

## 2022-09-13 DIAGNOSIS — G8250 Quadriplegia, unspecified: Secondary | ICD-10-CM

## 2022-09-13 DIAGNOSIS — R682 Dry mouth, unspecified: Secondary | ICD-10-CM | POA: Diagnosis present

## 2022-09-13 DIAGNOSIS — C7951 Secondary malignant neoplasm of bone: Secondary | ICD-10-CM | POA: Diagnosis not present

## 2022-09-13 DIAGNOSIS — N319 Neuromuscular dysfunction of bladder, unspecified: Secondary | ICD-10-CM | POA: Diagnosis not present

## 2022-09-13 DIAGNOSIS — Z510 Encounter for antineoplastic radiation therapy: Secondary | ICD-10-CM | POA: Diagnosis not present

## 2022-09-13 DIAGNOSIS — D72829 Elevated white blood cell count, unspecified: Secondary | ICD-10-CM | POA: Diagnosis present

## 2022-09-13 DIAGNOSIS — K5900 Constipation, unspecified: Secondary | ICD-10-CM | POA: Diagnosis present

## 2022-09-13 DIAGNOSIS — M25551 Pain in right hip: Secondary | ICD-10-CM | POA: Diagnosis present

## 2022-09-13 DIAGNOSIS — Z853 Personal history of malignant neoplasm of breast: Secondary | ICD-10-CM

## 2022-09-13 DIAGNOSIS — K219 Gastro-esophageal reflux disease without esophagitis: Secondary | ICD-10-CM | POA: Diagnosis present

## 2022-09-13 DIAGNOSIS — M4804 Spinal stenosis, thoracic region: Secondary | ICD-10-CM | POA: Diagnosis present

## 2022-09-13 DIAGNOSIS — F32A Depression, unspecified: Secondary | ICD-10-CM | POA: Diagnosis present

## 2022-09-13 DIAGNOSIS — Z79890 Hormone replacement therapy (postmenopausal): Secondary | ICD-10-CM | POA: Diagnosis not present

## 2022-09-13 DIAGNOSIS — Z83438 Family history of other disorder of lipoprotein metabolism and other lipidemia: Secondary | ICD-10-CM

## 2022-09-13 DIAGNOSIS — G992 Myelopathy in diseases classified elsewhere: Secondary | ICD-10-CM | POA: Diagnosis present

## 2022-09-13 DIAGNOSIS — C7931 Secondary malignant neoplasm of brain: Secondary | ICD-10-CM | POA: Diagnosis not present

## 2022-09-13 DIAGNOSIS — Z801 Family history of malignant neoplasm of trachea, bronchus and lung: Secondary | ICD-10-CM

## 2022-09-13 DIAGNOSIS — G629 Polyneuropathy, unspecified: Secondary | ICD-10-CM | POA: Diagnosis present

## 2022-09-13 DIAGNOSIS — Z808 Family history of malignant neoplasm of other organs or systems: Secondary | ICD-10-CM | POA: Diagnosis not present

## 2022-09-13 DIAGNOSIS — K592 Neurogenic bowel, not elsewhere classified: Secondary | ICD-10-CM | POA: Diagnosis present

## 2022-09-13 DIAGNOSIS — M4802 Spinal stenosis, cervical region: Secondary | ICD-10-CM | POA: Diagnosis not present

## 2022-09-13 DIAGNOSIS — G959 Disease of spinal cord, unspecified: Secondary | ICD-10-CM | POA: Diagnosis present

## 2022-09-13 DIAGNOSIS — E039 Hypothyroidism, unspecified: Secondary | ICD-10-CM | POA: Diagnosis present

## 2022-09-13 DIAGNOSIS — R7989 Other specified abnormal findings of blood chemistry: Secondary | ICD-10-CM | POA: Diagnosis not present

## 2022-09-13 DIAGNOSIS — E871 Hypo-osmolality and hyponatremia: Secondary | ICD-10-CM | POA: Diagnosis present

## 2022-09-13 DIAGNOSIS — Z923 Personal history of irradiation: Secondary | ICD-10-CM | POA: Diagnosis not present

## 2022-09-13 DIAGNOSIS — Z170 Estrogen receptor positive status [ER+]: Secondary | ICD-10-CM | POA: Diagnosis not present

## 2022-09-13 DIAGNOSIS — R748 Abnormal levels of other serum enzymes: Secondary | ICD-10-CM | POA: Diagnosis not present

## 2022-09-13 DIAGNOSIS — M8458XD Pathological fracture in neoplastic disease, other specified site, subsequent encounter for fracture with routine healing: Secondary | ICD-10-CM | POA: Diagnosis present

## 2022-09-13 DIAGNOSIS — R030 Elevated blood-pressure reading, without diagnosis of hypertension: Secondary | ICD-10-CM | POA: Diagnosis present

## 2022-09-13 DIAGNOSIS — Z8249 Family history of ischemic heart disease and other diseases of the circulatory system: Secondary | ICD-10-CM | POA: Diagnosis not present

## 2022-09-13 DIAGNOSIS — Z803 Family history of malignant neoplasm of breast: Secondary | ICD-10-CM | POA: Diagnosis not present

## 2022-09-13 DIAGNOSIS — C50919 Malignant neoplasm of unspecified site of unspecified female breast: Secondary | ICD-10-CM | POA: Diagnosis not present

## 2022-09-13 DIAGNOSIS — G8254 Quadriplegia, C5-C7 incomplete: Principal | ICD-10-CM | POA: Diagnosis present

## 2022-09-13 DIAGNOSIS — F17210 Nicotine dependence, cigarettes, uncomplicated: Secondary | ICD-10-CM | POA: Diagnosis present

## 2022-09-13 DIAGNOSIS — C50312 Malignant neoplasm of lower-inner quadrant of left female breast: Secondary | ICD-10-CM | POA: Diagnosis not present

## 2022-09-13 DIAGNOSIS — R7401 Elevation of levels of liver transaminase levels: Secondary | ICD-10-CM | POA: Diagnosis present

## 2022-09-13 DIAGNOSIS — F17200 Nicotine dependence, unspecified, uncomplicated: Secondary | ICD-10-CM | POA: Diagnosis present

## 2022-09-13 DIAGNOSIS — M25512 Pain in left shoulder: Secondary | ICD-10-CM | POA: Diagnosis present

## 2022-09-13 DIAGNOSIS — R630 Anorexia: Secondary | ICD-10-CM | POA: Diagnosis present

## 2022-09-13 DIAGNOSIS — R200 Anesthesia of skin: Secondary | ICD-10-CM | POA: Diagnosis not present

## 2022-09-13 DIAGNOSIS — I10 Essential (primary) hypertension: Secondary | ICD-10-CM | POA: Diagnosis not present

## 2022-09-13 DIAGNOSIS — R42 Dizziness and giddiness: Secondary | ICD-10-CM | POA: Diagnosis present

## 2022-09-13 LAB — URINALYSIS, ROUTINE W REFLEX MICROSCOPIC
Bilirubin Urine: NEGATIVE
Glucose, UA: NEGATIVE mg/dL
Hgb urine dipstick: NEGATIVE
Ketones, ur: NEGATIVE mg/dL
Leukocytes,Ua: NEGATIVE
Nitrite: NEGATIVE
Protein, ur: 30 mg/dL — AB
Specific Gravity, Urine: 1.028 (ref 1.005–1.030)
pH: 5 (ref 5.0–8.0)

## 2022-09-13 MED ORDER — LEVOTHYROXINE SODIUM 75 MCG PO TABS
75.0000 ug | ORAL_TABLET | Freq: Every day | ORAL | Status: DC
  Administered 2022-09-14 – 2022-09-27 (×14): 75 ug via ORAL
  Filled 2022-09-13 (×14): qty 1

## 2022-09-13 MED ORDER — JUVEN PO PACK
1.0000 | PACK | Freq: Two times a day (BID) | ORAL | Status: DC
  Administered 2022-09-13 – 2022-09-26 (×23): 1 via ORAL
  Filled 2022-09-13 (×24): qty 1

## 2022-09-13 MED ORDER — MENTHOL 3 MG MT LOZG: 1.0000 | LOZENGE | OROMUCOSAL | Status: DC | PRN

## 2022-09-13 MED ORDER — ORAL CARE MOUTH RINSE: 15.0000 mL | OROMUCOSAL | Status: DC | PRN

## 2022-09-13 MED ORDER — CITALOPRAM HYDROBROMIDE 20 MG PO TABS
20.0000 mg | ORAL_TABLET | Freq: Every day | ORAL | Status: DC
  Administered 2022-09-14 – 2022-09-19 (×6): 20 mg via ORAL
  Filled 2022-09-13 (×6): qty 1

## 2022-09-13 MED ORDER — GADOBUTROL 1 MMOL/ML IV SOLN
4.0000 mL | Freq: Once | INTRAVENOUS | Status: AC | PRN
  Administered 2022-09-13: 4 mL via INTRAVENOUS

## 2022-09-13 MED ORDER — LORAZEPAM 0.5 MG PO TABS
0.5000 mg | ORAL_TABLET | Freq: Three times a day (TID) | ORAL | Status: DC | PRN
  Administered 2022-09-16 – 2022-09-20 (×2): 0.5 mg via ORAL
  Filled 2022-09-13 (×2): qty 1

## 2022-09-13 MED ORDER — GUAIFENESIN-DM 100-10 MG/5ML PO SYRP: 5.0000 mL | ORAL_SOLUTION | Freq: Four times a day (QID) | ORAL | Status: DC | PRN

## 2022-09-13 MED ORDER — ACETAMINOPHEN 325 MG PO TABS
650.0000 mg | ORAL_TABLET | ORAL | Status: DC | PRN
  Administered 2022-09-13: 325 mg via ORAL
  Administered 2022-09-14: 650 mg via ORAL
  Administered 2022-09-14: 325 mg via ORAL
  Administered 2022-09-14 – 2022-09-26 (×17): 650 mg via ORAL
  Filled 2022-09-13 (×22): qty 2

## 2022-09-13 MED ORDER — SENNOSIDES-DOCUSATE SODIUM 8.6-50 MG PO TABS
1.0000 | ORAL_TABLET | Freq: Every day | ORAL | Status: DC
  Administered 2022-09-13 – 2022-09-14 (×2): 1 via ORAL
  Filled 2022-09-13 (×2): qty 1

## 2022-09-13 MED ORDER — PROCHLORPERAZINE 25 MG RE SUPP: 12.5000 mg | Freq: Four times a day (QID) | RECTAL | Status: DC | PRN

## 2022-09-13 MED ORDER — BISACODYL 10 MG RE SUPP
10.0000 mg | Freq: Every day | RECTAL | Status: DC
  Administered 2022-09-13 – 2022-09-18 (×6): 10 mg via RECTAL
  Filled 2022-09-13 (×7): qty 1

## 2022-09-13 MED ORDER — PROCHLORPERAZINE MALEATE 5 MG PO TABS: 5.0000 mg | ORAL_TABLET | Freq: Four times a day (QID) | ORAL | Status: DC | PRN

## 2022-09-13 MED ORDER — DIPHENHYDRAMINE HCL 12.5 MG/5ML PO ELIX: 12.5000 mg | ORAL_SOLUTION | Freq: Four times a day (QID) | ORAL | Status: DC | PRN

## 2022-09-13 MED ORDER — LETROZOLE 2.5 MG PO TABS
2.5000 mg | ORAL_TABLET | Freq: Every day | ORAL | Status: DC
  Administered 2022-09-14 – 2022-09-27 (×14): 2.5 mg via ORAL
  Filled 2022-09-13 (×14): qty 1

## 2022-09-13 MED ORDER — FLEET ENEMA 7-19 GM/118ML RE ENEM: 1.0000 | ENEMA | Freq: Once | RECTAL | Status: DC | PRN

## 2022-09-13 MED ORDER — POLYETHYLENE GLYCOL 3350 17 G PO PACK: 17.0000 g | PACK | Freq: Every day | ORAL | Status: DC | PRN

## 2022-09-13 MED ORDER — VITAMIN C 500 MG PO TABS
1000.0000 mg | ORAL_TABLET | Freq: Every day | ORAL | Status: DC
  Administered 2022-09-13 – 2022-09-27 (×15): 1000 mg via ORAL
  Filled 2022-09-13 (×15): qty 2

## 2022-09-13 MED ORDER — PROCHLORPERAZINE EDISYLATE 10 MG/2ML IJ SOLN: 5.0000 mg | Freq: Four times a day (QID) | INTRAMUSCULAR | Status: DC | PRN

## 2022-09-13 MED ORDER — ALUM & MAG HYDROXIDE-SIMETH 200-200-20 MG/5ML PO SUSP: 30.0000 mL | ORAL | Status: DC | PRN

## 2022-09-13 MED ORDER — ZINC SULFATE 220 (50 ZN) MG PO CAPS
220.0000 mg | ORAL_CAPSULE | Freq: Every day | ORAL | Status: AC
  Administered 2022-09-13 – 2022-09-26 (×14): 220 mg via ORAL
  Filled 2022-09-13 (×14): qty 1

## 2022-09-13 MED ORDER — GABAPENTIN 100 MG PO CAPS
100.0000 mg | ORAL_CAPSULE | Freq: Three times a day (TID) | ORAL | Status: DC
  Administered 2022-09-13 – 2022-09-16 (×10): 100 mg via ORAL
  Filled 2022-09-13 (×10): qty 1

## 2022-09-13 MED ORDER — PHENOL 1.4 % MT LIQD: 1.0000 | OROMUCOSAL | Status: DC | PRN

## 2022-09-13 MED ORDER — OXYCODONE HCL 5 MG PO TABS
2.5000 mg | ORAL_TABLET | ORAL | Status: DC | PRN
  Administered 2022-09-15 – 2022-09-19 (×4): 5 mg via ORAL
  Filled 2022-09-13 (×4): qty 1

## 2022-09-13 MED ORDER — TRAZODONE HCL 50 MG PO TABS
25.0000 mg | ORAL_TABLET | Freq: Every evening | ORAL | Status: DC | PRN
  Administered 2022-09-21 – 2022-09-24 (×2): 50 mg via ORAL
  Administered 2022-09-25: 25 mg via ORAL
  Administered 2022-09-26: 50 mg via ORAL
  Filled 2022-09-13 (×5): qty 1

## 2022-09-13 MED ORDER — CYCLOBENZAPRINE HCL 10 MG PO TABS
10.0000 mg | ORAL_TABLET | Freq: Three times a day (TID) | ORAL | Status: DC | PRN
  Administered 2022-09-16 – 2022-09-18 (×3): 10 mg via ORAL
  Filled 2022-09-13 (×3): qty 1

## 2022-09-13 MED ORDER — SORBITOL 70 % SOLN: 30.0000 mL | Freq: Every day | Status: DC | PRN

## 2022-09-13 MED ORDER — PROSOURCE PLUS PO LIQD
30.0000 mL | Freq: Two times a day (BID) | ORAL | Status: DC
  Administered 2022-09-13 – 2022-09-26 (×25): 30 mL via ORAL
  Filled 2022-09-13 (×24): qty 30

## 2022-09-13 MED ORDER — ENOXAPARIN SODIUM 40 MG/0.4ML IJ SOSY
40.0000 mg | PREFILLED_SYRINGE | INTRAMUSCULAR | Status: DC
  Administered 2022-09-14 – 2022-09-26 (×13): 40 mg via SUBCUTANEOUS
  Filled 2022-09-13 (×13): qty 0.4

## 2022-09-13 NOTE — Progress Notes (Signed)
Occupational Therapy Treatment Patient Details Name: Martha [name] MRN: [id number] DOB: May 24, 1961 Today's Date: 09/13/2022   History of present illness Pt is 62 yo F with cervical cord compression with myelopathy associated with widely metastatic breast cancer. Now s/p C1-C7 posterior fusion with decompression 1/10. Pt with known brain and spinal mets. PMH of anxiety.   OT comments  Pt remains highly motivated and eager to work with therapies. Min assist for standing grooming with less reliance on UE, but stabilizing pelvis on sink, min assist for UB dressing in sitting. Pt continues to need multimodal cues for log roll technique during bed mobility and min to min guard assist, depending on fatigue. Pt with c/o dizziness, but negative for orthostatic hypotension. Needs frequent, intermittent rest breaks.    Recommendations for follow up therapy are one component of a multi-disciplinary discharge planning process, led by the attending physician.  Recommendations may be updated based on patient status, additional functional criteria and insurance authorization.    Follow Up Recommendations  Acute inpatient rehab (3hours/day)     Assistance Recommended at Discharge Frequent or constant Supervision/Assistance  Patient can return home with the following  Two people to help with walking and/or transfers;A lot of help with bathing/dressing/bathroom;Assistance with cooking/housework;Assistance with feeding;Direct supervision/assist for medications management;Direct supervision/assist for financial management;Assist for transportation;Help with stairs or ramp for entrance   Equipment Recommendations  Other (comment) (defer to AIR)    Recommendations for Other Services      Precautions / Restrictions Precautions Precautions: Cervical;Fall Required Braces or Orthoses: Cervical Brace Cervical Brace: Hard collar Restrictions Weight Bearing Restrictions: No       Mobility Bed  Mobility Overal bed mobility: Needs Assistance Bed Mobility: Rolling, Sidelying to Sit, Sit to Sidelying Rolling: Supervision Sidelying to sit: Supervision, Min assist     Sit to sidelying: Min assist, Min guard General bed mobility comments: Cues for log roll technique; first time with supervision, second rep min A for trunk; required tactile cues for technique    Transfers Overall transfer level: Needs assistance Equipment used: Rolling walker (2 wheels), 2 person hand held assist Transfers: Sit to/from Stand Sit to Stand: Min assist           General transfer comment: Sit to stand with arms over therapist shoulders x 1 with light min A (therapist crouched to keep pts arms below shoulder level).  Sit to stand with RW x 5 with focus on foot placement, controlled rise and descent, therapist providing tactile cues/assist to keep knees apart;  pt had 1 quick descent but able to control other 4.  See balance for further standing     Balance Overall balance assessment: Needs assistance Sitting-balance support: No upper extremity supported, Feet supported, Bilateral upper extremity supported Sitting balance-Leahy Scale: Fair     Standing balance support: During functional activity, Bilateral upper extremity supported Standing balance-Leahy Scale: Poor                             ADL either performed or assessed with clinical judgement   ADL Overall ADL's : Needs assistance/impaired     Grooming: Oral care;Sitting;Standing;Minimal assistance           Upper Body Dressing : Sitting;Minimal assistance                   Functional mobility during ADLs: +2 for safety/equipment;Rolling walker (2 wheels);Minimal assistance General ADL Comments: pt stabilizing pelvis on sink with  both hands free for grooming    Extremity/Trunk Assessment              Vision       Perception     Praxis      Cognition Arousal/Alertness: Awake/alert Behavior  During Therapy: WFL for tasks assessed/performed Overall Cognitive Status: Impaired/Different from baseline Area of Impairment: Memory                     Memory: Decreased short-term memory         General Comments: Motivated and eager to participate        Exercises      Shoulder Instructions       General Comments Pt had some c/o dizziness after first bout of gait.  With next time up checked orthostatic BP and was stable. No dizziness this bout. Pt was also holding breath first bout - encouraged deep breathing and talking/singing next bout    Pertinent Vitals/ Pain       Pain Assessment Pain Assessment: Faces Faces Pain Scale: Hurts little more Pain Location: L side, shoulders Pain Descriptors / Indicators: Guarding Pain Intervention(s): Monitored during session, Premedicated before session, Repositioned  Home Living                                          Prior Functioning/Environment              Frequency  Min 3X/week        Progress Toward Goals  OT Goals(current goals can now be found in the care plan section)  Progress towards OT goals: Progressing toward goals  Acute Rehab OT Goals OT Goal Formulation: With patient Time For Goal Achievement: 09/21/22 Potential to Achieve Goals: Good  Plan Discharge plan remains appropriate    Co-evaluation    PT/OT/SLP Co-Evaluation/Treatment: Yes Reason for Co-Treatment: For patient/therapist safety PT goals addressed during session: Mobility/safety with mobility OT goals addressed during session: ADL's and self-care      AM-PAC OT "6 Clicks" Daily Activity     Outcome Measure   Help from another person eating meals?: A Little Help from another person taking care of personal grooming?: A Little Help from another person toileting, which includes using toliet, bedpan, or urinal?: A Lot Help from another person bathing (including washing, rinsing, drying)?: A Lot Help from  another person to put on and taking off regular upper body clothing?: A Lot Help from another person to put on and taking off regular lower body clothing?: A Lot 6 Click Score: 14    End of Session Equipment Utilized During Treatment: Gait belt;Rolling walker (2 wheels)  OT Visit Diagnosis: Unsteadiness on feet (R26.81);Other abnormalities of gait and mobility (R26.89);Pain   Activity Tolerance Patient tolerated treatment well   Patient Left in chair;with call bell/phone within reach;with bed alarm set   Nurse Communication          Time: 2952-8413 OT Time Calculation (min): 38 min  Charges: OT General Charges $OT Visit: 1 Visit  Martha [name], OTR/L Acute Rehabilitation Services Office: [phone]   Malka So 09/13/2022, 11:57 AM

## 2022-09-13 NOTE — Progress Notes (Signed)
Bilateral lower extremity venous duplex has been completed. Preliminary results can be found in CV Proc through chart review.   09/13/22 4:04 PM Carlos Levering RVT

## 2022-09-13 NOTE — Discharge Instructions (Signed)
Can shower, do not directly scrub wound Walk as much as possible No heavy lifting >10 lbs Wear C-collar at all times

## 2022-09-13 NOTE — Progress Notes (Signed)
Inpatient Rehabilitation Admission Medication Review by a Pharmacist  A complete drug regimen review was completed for this patient to identify any potential clinically significant medication issues.  High Risk Drug Classes Is patient taking? Indication by Medication  Antipsychotic Yes PRN prochlorperazine - n/v  Anticoagulant Yes Enoxaparin - VTE prophylaxis  Antibiotic No   Opioid Yes Oxycodone - severe pain  Antiplatelet No   Hypoglycemics/insulin No   Vasoactive Medication No   Chemotherapy Yes, Oral Chemotherapy Letrozole - breast cancer adjuvant therapy  Other No Citalopram - mood Gabapentin - neuropathic pain Levothyroxine - thyroid supplement Vitamin C, Zinc, Prosource, Juven - supplements Bisacodyl suppository, senokot S - laxatives  PRNs: Lorazepam - anxiety Trazodone - sleep Maalox - indigestion Cyclobenzaprine - muscle spams Diphenhydramine - itching Guaifensin DM - cough Cepacol lozenges or Chloraseptic spray - sore throat Miralax, sorbitol, Fleets enema - laxatives     Type of Medication Issue Identified Description of Issue Recommendation(s)  Drug Interaction(s) (clinically significant)     Duplicate Therapy     Allergy     No Medication Administration End Date     Incorrect Dose     Additional Drug Therapy Needed     Significant med changes from prior encounter (inform family/care partners about these prior to discharge).    Other  Off PTA Probiotic and Protandim OTC supplement. Resume Probiotic while inpatient if warranted, or after discharge. Resume OTC supplement after discharge.    Clinically significant medication issues were identified that warrant physician communication and completion of prescribed/recommended actions by midnight of the next day:  No  Pharmacist comments:   Time spent performing this drug regimen review (minutes):  20  Arty Baumgartner, [place] 09/13/2022 4:28 PM

## 2022-09-13 NOTE — Progress Notes (Signed)
Inpatient Rehab Admissions Coordinator:    I have insurance approval and a bed available for pt to admit to CIR today. Dr. Marcello Moores in agreement.  Will let pt/family and TOC team know.   Shann Medal, PT, DPT Admissions Coordinator [phone] 09/13/22  9:52 AM

## 2022-09-13 NOTE — Telephone Encounter (Signed)
I spoke with Mr. Rodden about the plans to move forward with SRS to treat [name]'s brain on 1/24 based on the new planning MRI completed today. I shared that there are 2 new lesions that were seen on today's scan that will be included in the treatment. After the treatment has been completed on 1/24, we will re-scan her neck using the original mask made during the 1/3 simulation. We need an updated scan due to the change of anatomy caused by her second neck surgery. We will verify that the original mask still fits well and have the needed imaging to plan her post operative cervical spine radiation course. The cervical spine course is scheduled to begin on 09/27/22.   [name] was thankful for the call and explanation of current events and changes. He has my contact information in case other questions arise.   Mont Dutton R.T.(R)(T) Radiation Special Procedures Navigator

## 2022-09-13 NOTE — Progress Notes (Signed)
PMR Admission Coordinator Pre-Admission Assessment   Patient: Martha [name] is an 62 y.o., female MRN: [id number] DOB: 11/11/1960 Height: '5\' 3"'$  (160 cm) Weight: 44 kg   Insurance Information HMO:     PPO: yes     PCP:      IPA:      80/20:      OTHER:  PRIMARY: Mollie [place]      Policy#: [id number]      Subscriber: pt CM Name: n/a      Phone#: n/a     Fax#: [phone] Pre-Cert#: [id number] auth for CIR via faxed approval (no phone number contact provided) with updates due to fax listed above on 1/22      Employer: n/a Benefits:  Phone #: [phone]     Name:  Eff. Date: 08/28/20     Deduct: $4000 (met $233)      Out of Pocket Max: $4000 (met $221)      Life Max: n/a CIR: 80%      SNF: 20% Outpatient: 80%     Co-Ins: 20% Home Health: 80%      Co-Ins: 20% DME: 80%     Co-Ins: 20% Providers:  SECONDARY:       Policy#:      Phone#:    Development worker, community:       Phone#:    The Therapist, art Information Summary" for patients in Inpatient Rehabilitation Facilities with attached "Privacy Act [place] Records" was provided and verbally reviewed with: N/A   Emergency Contact Information Contact Information       Name Relation Home Work Mobile    [place] Spouse [phone]   [phone]           Current Medical History  Patient Admitting Diagnosis: myelopathy 2/2 metastatic cord compression     History of Present Illness: Pt is a 62 y/o with PMH of anxiety, breast cancer, and known brain and spinal mets, pathological spinal fx s/p posterior fusion of C1-4 in December 2023, who admitted to [facility] on 09/04/22 with c/o BLE weakness which had worsened significantly over a period of 2 days with associated decreased sensation from the waist down.  Vitals and labs in ED relatively unremarkable.  Imaging revealed widespread spine and osseous disease, visceral disease in liver, and possibly in lungs, with spinal stenosis at C5-6 levels.  Neurosurgery  consulted and recommended dexamethasone taper and offered option of stabilization surgery to preserve neurological function.  Oncology and radiation oncology also consulted.  Pathology on cervical mass biopsy returned showing metastatic breast cancer which was different from her previous type of breast cancer.  Both oncology and radiation oncology recommend recovery from surgery, including rehab, prior to initiating treatment for breast cancer.  Therapy ongoing and pt was recommended for CIR.    Patient's medical record from [name] has been reviewed by the rehabilitation admission coordinator and physician.   Past Medical History      Past Medical History:  Diagnosis Date   Anxiety     Cancer ([place]) 2022    right breast LCIS   Cancer ([place]) 2019    left breast   Depression     Dysrhythmia      SVT, s/p ablation ~ 2012 in at [facility]   Ectopic pregnancy     Family history of breast cancer     Family history of melanoma     History of radiation therapy 04/22/18-06/03/18    Left Breast, left SCV, axilla 50 Gy  in 25 fractions, Left breast boost 10 Gy in 5 fractions.    Hypothyroidism     Personal history of radiation therapy     PONV (postoperative nausea and vomiting)     Thyroid disease        Has the patient had major surgery during 100 days prior to admission? Yes   Family History   family history includes Breast cancer in her cousin and other family members; Dementia in her paternal grandmother; Heart disease in her mother; Hyperlipidemia in her sister; Lung cancer in her maternal uncle; Lung disease in her paternal grandfather; Melanoma (age of onset: 66) in her sister; Pneumonia in her father and maternal uncle.   Current Medications   Current Facility-Administered Medications:    0.9 %  sodium chloride infusion, 250 mL, Intravenous, Continuous, Vallarie Mare, MD   0.9 % NaCl with KCl 20 mEq/ L  infusion, , Intravenous, Continuous, Vallarie Mare, MD,  Stopped at 09/11/22 [phone]   acetaminophen (TYLENOL) tablet 650 mg, 650 mg, Oral, Q4H PRN, 650 mg at 09/12/22 1021 **OR** acetaminophen (TYLENOL) suppository 650 mg, 650 mg, Rectal, Q4H PRN, Vallarie Mare, MD   citalopram (CELEXA) tablet 20 mg, 20 mg, Oral, Daily, Vallarie Mare, MD, 20 mg at 09/12/22 0843   cyclobenzaprine (FLEXERIL) tablet 10 mg, 10 mg, Oral, TID PRN, Vallarie Mare, MD, 10 mg at 09/06/22 0956   docusate sodium (COLACE) capsule 100 mg, 100 mg, Oral, BID, Vallarie Mare, MD, 100 mg at 09/12/22 0845   enoxaparin (LOVENOX) injection 30 mg, 30 mg, Subcutaneous, Q24H, Bergman, Meghan D, NP, 30 mg at 09/12/22 0842   gabapentin (NEURONTIN) capsule 100 mg, 100 mg, Oral, TID, Vallarie Mare, MD, 100 mg at 09/12/22 0843   letrozole [facility]) tablet 2.5 mg, 2.5 mg, Oral, Daily, Vallarie Mare, MD, 2.5 mg at 09/12/22 0846   levothyroxine (SYNTHROID) tablet 75 mcg, 75 mcg, Oral, Q0600, Vallarie Mare, MD, 75 mcg at 09/12/22 0544   LORazepam (ATIVAN) tablet 0.5 mg, 0.5 mg, Oral, TID PRN, Collene Schlichter, PA-C, 0.5 mg at 09/11/22 2458   menthol-cetylpyridinium (CEPACOL) lozenge 3 mg, 1 lozenge, Oral, PRN **OR** phenol (CHLORASEPTIC) mouth spray 1 spray, 1 spray, Mouth/Throat, PRN, Vallarie Mare, MD   ondansetron [facility]) tablet 4 mg, 4 mg, Oral, Q6H PRN **OR** ondansetron (ZOFRAN) injection 4 mg, 4 mg, Intravenous, Q6H PRN, Vallarie Mare, MD   Oral care mouth rinse, 15 mL, Mouth Rinse, PRN, Vallarie Mare, MD   oxyCODONE (Oxy IR/ROXICODONE) immediate release tablet 2.5-5 mg, 2.5-5 mg, Oral, Q3H PRN, Reinaldo Meeker, Meghan D, NP   polyethylene glycol (MIRALAX / GLYCOLAX) packet 17 g, 17 g, Oral, Daily PRN, Vallarie Mare, MD, 17 g at 09/08/22 2058   senna-docusate (Senokot-S) tablet 1 tablet, 1 tablet, Oral, QHS, Vallarie Mare, MD, 1 tablet at 09/11/22 2123   sodium chloride flush (NS) 0.9 % injection 3 mL, 3 mL, Intravenous, Q12H, Vallarie Mare,  MD, 3 mL at 09/12/22 0848   sodium chloride flush (NS) 0.9 % injection 3 mL, 3 mL, Intravenous, PRN, Vallarie Mare, MD   Patients Current Diet:  Diet Order                  Diet regular Room service appropriate? Yes; Fluid consistency: Thin  Diet effective now  Precautions / Restrictions Precautions Precautions: Cervical, Fall Precaution Booklet Issued: No Precaution Comments: Reviewed handout and pt was cued for precautions during functional mobility. Cervical Brace: Hard collar Restrictions Weight Bearing Restrictions: No    Has the patient had 2 or more falls or a fall with injury in the past year? Yes   Prior Activity Level Community (5-7x/wk): prior to december was independent without device, driving; since surgery in dec has been using DME and not driving   Prior Functional Level Self Care: Did the patient need help bathing, dressing, using the toilet or eating? Independent   Indoor Mobility: Did the patient need assistance with walking from room to room (with or without device)? Independent   Stairs: Did the patient need assistance with internal or external stairs (with or without device)? Independent   Functional Cognition: Did the patient need help planning regular tasks such as shopping or remembering to take medications? Independent   Patient Information Are you of Hispanic, Latino/a,or Spanish origin?: A. No, not of Hispanic, Latino/a, or Spanish origin What is your race?: A. White Do you need or want an interpreter to communicate with a doctor or health care staff?: 0. No   Patient's Response To:  Health Literacy and Transportation Is the patient able to respond to health literacy and transportation needs?: Yes Health Literacy - How often do you need to have someone help you when you read instructions, pamphlets, or other written material from your doctor or pharmacy?: Never In the past 12 months, has lack of transportation kept  you from medical appointments or from getting medications?: No In the past 12 months, has lack of transportation kept you from meetings, work, or from getting things needed for daily living?: No   Development worker, international aid / [address] Devices/Equipment: Wheelchair Home Equipment: Rollator (4 wheels), Conservation officer, nature (2 wheels), Wheelchair - manual, Shower seat   Prior Device Use: Indicate devices/aids used by the patient prior to current illness, exacerbation or injury?  Most recently using RW or w/c for mobility but prior to December did not require DME   Current Functional Level Cognition   Overall Cognitive Status: Within Functional Limits for tasks assessed Orientation Level: Oriented X4 General Comments: Anxious but very eager to participate and improve    Extremity Assessment (includes Sensation/Coordination)   Upper Extremity Assessment: Defer to OT evaluation RUE Deficits / Details: R shoulder flexion severely limited- 20 degrees actively. RUE: Shoulder pain with ROM RUE Sensation: decreased light touch RUE Coordination: decreased fine motor, decreased gross motor LUE Deficits / Details: Flexion severely limited- about 40 degrees LUE: Shoulder pain with ROM LUE Sensation: decreased light touch LUE Coordination: decreased fine motor, decreased gross motor  Lower Extremity Assessment: RLE deficits/detail, LLE deficits/detail RLE Deficits / Details: unable to fully assess due to pain LLE Deficits / Details: unable to fully assess due to pain (     ADLs   Overall ADL's : Needs assistance/impaired Eating/Feeding: Set up, Sitting, Bed level Eating/Feeding Details (indicate cue type and reason): self fed banana peeled for her, drinks with cup with straw and 2 hands Grooming: Oral care, Brushing hair, Wash/dry face, Sitting, Standing Grooming Details (indicate cue type and reason): pt set up toothbrush in sitting, stood for oral care, sat for brushing hair and washing  face, assist to reach back of head Upper Body Bathing: Moderate assistance, Sitting Lower Body Bathing: Total assistance, Sit to/from stand Upper Body Dressing : Sitting, Minimal assistance Lower Body Dressing: Moderate assistance, Sit to/from  stand Toilet Transfer: Minimal assistance, BSC/3in1 Toilet Transfer Details (indicate cue type and reason): pt with sensation of need to have BM, incontinent, but finished on BSC, unaware when she was done Toileting- Water quality scientist and Hygiene: Total assistance, Sit to/from stand Functional mobility during ADLs: Moderate assistance, Rolling walker (2 wheels), +2 for safety/equipment General ADL Comments: Mod A overall with the RW for ADL transfers. Good carryover of figure 4 dressing technique. Cueing for reducing knee hyperextension during stand.     Mobility   Overal bed mobility: Needs Assistance Bed Mobility: Rolling, Sidelying to Sit Rolling: Supervision Sidelying to sit: Min assist, HOB elevated Sit to sidelying: Min guard General bed mobility comments: Cues for log roll technique with min A for trunk     Transfers   Overall transfer level: Needs assistance Equipment used: Rolling walker (2 wheels) Transfers: Sit to/from Stand Sit to Stand: Mod assist, Min assist Bed to/from chair/wheelchair/BSC transfer type:: Stand pivot Step pivot transfers: Mod assist General transfer comment: Pt required min to mod A for multiple sit to stands.  Initially with posterior bias but then anterior.  With standing pt tending to use momentum to power up through initial phase and then pushing with legs.  Return to sitting similar - starts controlled and then required assist to control descent.  She has a range in which she is very weak and uses momentum to power up and then sits quickly.  Worked on at least 10 sit to stands during session with rest breaks with cues for gradual rise to strengthen (cues to make sure legs in good place, then therapy providing  tactile cues to keeps knees apart and not internally rotate hips).  With sitting worked on cueing for controlled descent and therapy provided tactile cues between knees to prevent internal rotation.  Pt has difficulty with controlled descent but showing improvement.     Ambulation / Gait / Stairs / Wheelchair Mobility   Ambulation/Gait Ambulation/Gait assistance: Mod assist, +2 safety/equipment, +2 physical assistance Gait Distance (Feet): 8 Feet Assistive device: Rolling walker (2 wheels) Gait Pattern/deviations: Step-to pattern, Ataxic, Wide base of support, Staggering right, Staggering left General Gait Details: Pt with very ataxic gait requiring cues for step length and assist with weight shifting.  Occasional assist to push RW forward.  Overall, mod A for balance but did provide with assist of 2 to improve pt's confidence/less fear of falling.  Had chair follow. Gait velocity: Decreased Gait velocity interpretation: <1.31 ft/sec, indicative of household ambulator     Posture / Balance Dynamic Sitting Balance Sitting balance - Comments: min guard at  EOB during bathing and dressing Balance Overall balance assessment: Needs assistance Sitting-balance support: No upper extremity supported, Feet supported Sitting balance-Leahy Scale: Fair Sitting balance - Comments: min guard at  EOB during bathing and dressing Standing balance support: During functional activity, Bilateral upper extremity supported Standing balance-Leahy Scale: Poor Standing balance comment: Pt with poor postural control in standing - first stand with posterior bias but subsequent stands with anterior bias. Requiring min-mod A for balance and RW.     Special needs/care consideration Skin surgical incisions and Special service needs oncology f/u as outpatient with Dr. Burr Medico    Previous Home Environment (from acute therapy documentation) Living Arrangements: Spouse/significant other Available Help at Discharge: Family,  Available 24 hours/day Type of Home: House Home Layout: One level Alternate Level Stairs-Rails: Right Alternate Level Stairs-Number of Steps: 1 step down into living room Home Access: Stairs to enter Entrance Stairs-Rails: Can  reach both Entrance Stairs-Number of Steps: 2 Bathroom Shower/Tub: Curtain, Engineer, mining: Yes How Accessible: Accessible via wheelchair, Accessible via walker [place]: No Additional Comments: adjustable bed   Discharge Living Setting Plans for Discharge Living Setting: Patient's home, Lives with (comment) (spouse [name])) Type of Home at Discharge: House Discharge Home Layout: One level (1 step down into living room with R handrail) Discharge Home Access: Stairs to enter Entrance Stairs-Rails: Can reach both Entrance Stairs-Number of Steps: 2 Discharge Bathroom Shower/Tub: Walk-in shower Discharge Bathroom Toilet: Standard Discharge Bathroom Accessibility: Yes How Accessible: Accessible via wheelchair Does the patient have any problems obtaining your medications?: No   Social/Family/Support Systems Patient Roles: Spouse Anticipated Caregiver: spouse and other family Anticipated Caregiver's Contact Information: [name] Ability/Limitations of Caregiver: [phone] Caregiver Availability: Intermittent Discharge Plan Discussed with Primary Caregiver: Yes Is Caregiver In Agreement with Plan?: Yes Does Caregiver/Family have Issues with Lodging/Transportation while Pt is in Rehab?: No   Goals Patient/Family Goal for Rehab: PT/OT supervision to min assist, SLP n/a Expected length of stay: 12-14 days Additional Information: f/u with Dr. Burr Medico oncology Pt/Family Agrees to Admission and willing to participate: Yes Program Orientation Provided & Reviewed with Pt/Caregiver Including Roles  & Responsibilities: Yes Additional Information Needs: further oncology treatment to occur following recovery from  surgery  Barriers to Discharge: Insurance for SNF coverage, Decreased caregiver support   Decrease burden of Care through IP rehab admission: n/a   Possible need for SNF placement upon discharge: Not anticipated   Patient Condition: I have reviewed medical records from [facility], spoken with CM, and patient and spouse. I met with patient at the bedside for inpatient rehabilitation assessment.  Patient will benefit from ongoing PT and OT, can actively participate in 3 hours of therapy a day 5 days of the week, and can make measurable gains during the admission.  Patient will also benefit from the coordinated team approach during an Inpatient Acute Rehabilitation admission.  The patient will receive intensive therapy as well as Rehabilitation physician, nursing, social worker, and care management interventions.  Due to bladder management, bowel management, safety, skin/wound care, disease management, medication administration, pain management, and patient education the patient requires 24 hour a day rehabilitation nursing.  The patient is currently mod +2 with mobility and basic ADLs.  Discharge setting and therapy post discharge at home with home health is anticipated.  Patient has agreed to participate in the Acute Inpatient Rehabilitation Program and will admit today.   Preadmission Screen Completed By:  Michel Santee, PT, DPT 09/12/2022 3:56 PM ______________________________________________________________________   Discussed status with Dr. Curlene Dolphin on 09/13/22 at 9:58 AM  and received approval for admission today.   Admission Coordinator:  Michel Santee, PT, DPT time 9:58 AM Sudie Grumbling 09/13/22     Assessment/Plan: Diagnosis: Myelopathy, metastatic breast cancer to spine, s/p cervical spinal decompression 1/10 Does the need for close, 24 hr/day Medical supervision in concert with the patient's rehab needs make it unreasonable for this patient to be served in a less intensive setting?  Yes Co-Morbidities requiring supervision/potential complications: Hypothyroidism, nicotine dependence, pain Due to bladder management, bowel management, safety, skin/wound care, disease management, medication administration, pain management, and patient education, does the patient require 24 hr/day rehab nursing? Yes Does the patient require coordinated care of a physician, rehab nurse, PT, OT, and SLP to address physical and functional deficits in the context of the above medical diagnosis(es)? Yes Addressing deficits in the following areas: balance, endurance,  locomotion, strength, transferring, bowel/bladder control, bathing, dressing, feeding, grooming, toileting, cognition, speech, language, swallowing, and psychosocial support Can the patient actively participate in an intensive therapy program of at least 3 hrs of therapy 5 days a week? Yes The potential for patient to make measurable gains while on inpatient rehab is excellent Anticipated functional outcomes upon discharge from inpatient rehab: supervision and min assist PT, supervision and min assist OT, n/a SLP Estimated rehab length of stay to reach the above functional goals is: 12-14 Anticipated discharge destination: Home 10. Overall Rehab/Functional Prognosis: excellent     MD Signature: Jennye Boroughs

## 2022-09-13 NOTE — H&P (Shared)
/   Physical Medicine and Rehabilitation Admission H&P    Chief Complaint  Patient presents with   Functional deficits due to cervical myelopathy/metatatic breast cancer.     HPI: Martha [name] is a 62 year old female with history of left breast CA 01/2018  s/p lumpectomy & XRT, right  breast cancer 04/2021, neck pain w/torticollis treated with PT since 10/02 and follow up MRI C spine showed extensive metastatic disease with pathological Fx at base of dens and C2 right lateral mass with extraosseous tumor at C1 and C2 and degenerative changes C4-C7. She was placed in collar and referred to Dr. Collene Gobble.  PET scan done revealing large volume osseous mets with suspicion of hepatic mets as well as brain mets on MRI.  Dr. Burr Medico  recommended bone biopsy to decide on cancer tx and goal palliative. as goal of care  She underwent C1-C4 fusion with reduction of C2 fracture and bone biopsy on 08/17/22 by Dr. Marcello Moores and was discharged to home 12/23 with plans to start XRT in 1-2 weeks.    Post op seen in ED for BUE pain with spasms treated with steroids and gabapentin. She was admitted on 09/04/22 with increased numbness and weakness in hands progressing to inability to walk as well as decreased sensation from waist down. MRI C/T/L spine done revealing stable fusion but multiple mild to  moderate pathological Fx T2,T5, T6, T10 and T11 as well as mildly progressive spinal stenosis C5 and C6 with cord flattening as well as possible lung mets. Case discussed with Dr. Lanell Persons and Dr. Burr Medico and patient elected on aggressive tx. She underwent laminectomy C4-C7 with extension of previous instrumentation and arthrodesis on 09/06/22.  JP drain removed and she was cleared to start Lovenox for DVT prophylaxis. She has had improvement in sensation BUE as well as strength. Issues with hallucinations felt to be narcotic related and oxycodone dose decreased.MRI brain done today. PT/OT has been working with patient who  continues to be limited by ataxia with sensory deficits, pain, fatigue, posterior lean, dizziness as well as decrease in control of BLE. CIR was recommended due to functional decline.     Review of Systems  Constitutional:  Negative for fever.  HENT:  Negative for hearing loss.   Eyes:  Negative for blurred vision and double vision.  Respiratory:  Negative for cough and shortness of breath.   Cardiovascular:  Negative for chest pain and palpitations.  Gastrointestinal:  Positive for constipation. Negative for abdominal pain, nausea and vomiting.  Musculoskeletal:  Positive for myalgias and neck pain. Negative for back pain, falls and joint pain.  Skin:  Negative for itching.  Neurological:  Positive for sensory change, weakness and headaches (some HA last night).  Psychiatric/Behavioral:  Negative for hallucinations. The patient is nervous/anxious. The patient does not have insomnia (now sleeping well).      Past Medical History:  Diagnosis Date   Anxiety    Cancer ([place]) 2022   right breast LCIS   Cancer ([place]) 2019   left breast   Depression    Dysrhythmia    SVT, s/p ablation ~ 2012 in at [facility]   Ectopic pregnancy    Family history of breast cancer    Family history of melanoma    History of radiation therapy 04/22/18-06/03/18   Left Breast, left SCV, axilla 50 Gy in 25 fractions, Left breast boost 10 Gy in 5 fractions.    Hypothyroidism    Personal history of radiation  therapy    PONV (postoperative nausea and vomiting)    Thyroid disease     Past Surgical History:  Procedure Laterality Date   APPENDECTOMY     BILATERAL SALPINGECTOMY     BREAST BIOPSY Right 2014   fibroadenoma   BREAST BIOPSY Left 01/16/2018   BREAST BIOPSY Right 02/02/2020   LCIS   BREAST BIOPSY Right 08/04/2020   x2 LCIS   BREAST BIOPSY Right 08/13/2020   x2   BREAST EXCISIONAL BIOPSY Left 1990   benign   BREAST EXCISIONAL BIOPSY Right 02/02/2020   LCIS   BREAST LUMPECTOMY  Left 01/22/2018   BREAST LUMPECTOMY WITH AXILLARY LYMPH NODE BIOPSY Left 02/21/2018   Procedure: LEFT BREAST LUMPECTOMY WITH AXILLARY LYMPH NODE BIOPSY;  Surgeon: [name], MD;  Location: [place];  Service: General;  Laterality: Left;   BREAST LUMPECTOMY WITH RADIOACTIVE SEED LOCALIZATION Right 03/18/2020   Procedure: RIGHT BREAST LUMPECTOMY WITH RADIOACTIVE SEED LOCALIZATION;  Surgeon: [name], MD;  Location: [place];  Service: General;  Laterality: Right;   BREAST SURGERY Left 1993   cyst removed   ENDOMETRIAL ABLATION     EYE SURGERY     GLAUCOMA SURGERY Bilateral    POSTERIOR CERVICAL FUSION/FORAMINOTOMY N/A 08/17/2022   Procedure: Cervical One-Cervical Four POSTERIOR CERVICAL FUSION,  Cervical One LAMINECTOMY REDUCTION OF Cervical Two Fracture, Biopsy of Right Cerical Two Pars  Lesion;  Surgeon: Vallarie Mare, MD;  Location: [place];  Service: Neurosurgery;  Laterality: N/A;   POSTERIOR CERVICAL FUSION/FORAMINOTOMY N/A 09/06/2022   Procedure: Posterior Cervical Fusion, Foraminotomy , Cervical Five-Six, Cervical Six-Seven; Extension of  fusion Cervical Four- Thoracic One;  Surgeon: Vallarie Mare, MD;  Location: [place];  Service: Neurosurgery;  Laterality: N/A;   RADIOACTIVE SEED GUIDED EXCISIONAL BREAST BIOPSY Right 04/28/2021   Procedure: RADIOACTIVE SEED GUIDED EXCISIONAL RIGHT BREAST BIOPSY X2;  Surgeon: [name], MD;  Location: [place];  Service: General;  Laterality: Right;   RE-EXCISION OF BREAST LUMPECTOMY Left 03/20/2018   Procedure: RE-EXCISION OF BREAST LUMPECTOMY;  Surgeon: [name], MD;  Location: [place];  Service: General;  Laterality: Left;    Family History  Problem Relation Age of Onset   Heart disease Mother    Pneumonia Father    Hyperlipidemia Sister    Melanoma Sister 75       hx of 5 melanomas   Lung cancer Maternal Uncle        mesothelioma   Pneumonia Maternal Uncle    Dementia Paternal Grandmother     Lung disease Paternal Grandfather    Breast cancer Other        MGMs sister dx < 52   Breast cancer Other        PGFs sister dx < 40   Breast cancer Cousin        mother's mat first cousin, dx < 35    Social History:   Married. Unemployed since last year to care for her mother. Mother now being cared for by other siblings. She  reports that she has been smoking cigarettes. She has a 20.00 pack-year smoking history. She has never used smokeless tobacco. She reports that she drinks 4 bottles of beer/daily. She reports that she does not use drugs.   Allergies  Allergen Reactions   Morphine And Related Other (See Comments)    "Makes her angry"    Medications Prior to Admission  Medication Sig Dispense Refill   citalopram (CELEXA) 20 MG tablet  Take 1 tablet (20 mg total) by mouth daily. 90 tablet 3   cyclobenzaprine (FLEXERIL) 10 MG tablet Take 1 tablet (10 mg total) by mouth 3 (three) times daily as needed for muscle spasms. 30 tablet 0   gabapentin (NEURONTIN) 100 MG capsule Take 1 capsule (100 mg total) by mouth 3 (three) times daily for 15 days. 45 capsule 0   letrozole (FEMARA) 2.5 MG tablet Take 2.5 mg by mouth daily.     levothyroxine (SYNTHROID) 75 MCG tablet Take 1 tablet (75 mcg total) by mouth daily before breakfast. 90 tablet 3   OVER THE COUNTER MEDICATION Take 1 tablet by mouth daily. Protandim Supplement     oxyCODONE-acetaminophen (PERCOCET) 5-325 MG tablet Take 1-2 tablets by mouth every 4 (four) hours as needed for severe pain (postoperative pain). (Patient taking differently: Take 0.5-1 tablets by mouth every 4 (four) hours.) 20 tablet 0   predniSONE (STERAPRED UNI-PAK 21 TAB) 10 MG (21) TBPK tablet Take by mouth daily. Take 6 tabs by mouth daily  for 2 days, then 5 tabs for 2 days, then 4 tabs for 2 days, then 3 tabs for 2 days, 2 tabs for 2 days, then 1 tab by mouth daily for 2 days (Patient taking differently: Take 10-60 mg by mouth See admin instructions. Take 60 mg by  mouth once daily with food for 2 days, 50 mg once daily for 2 days, 40 mg once daily for 2 days, 30 mg once daily for 2 days, 20 mg once daily for 2 days, 10 mg once daily for 2 days, then d/c) 42 tablet 0   Probiotic Product (PROBIOTIC PO) Take 1 capsule by mouth daily.     traMADol (ULTRAM) 50 MG tablet Take 50 mg by mouth every 6 (six) hours as needed (for pain).     TYLENOL 500 MG tablet Take 500 mg by mouth every 6 (six) hours as needed for mild pain or headache.        Home: Home Living Family/patient expects to be discharged to:: Private residence Living Arrangements: Spouse/significant other Available Help at Discharge: Family, Available 24 hours/day Type of Home: House Home Access: Stairs to enter [employer] of Steps: 2 Entrance Stairs-Rails: Can reach both Home Layout: One level Alternate Level Stairs-Number of Steps: 1 step down into living room Alternate Level Stairs-Rails: Right Bathroom Shower/Tub: Curtain, Multimedia programmer: Standard Bathroom Accessibility: Yes Home Equipment: Rollator (4 wheels), [place] (2 wheels), Wheelchair - manual, Shower seat Additional Comments: adjustable bed   Functional History: Prior Function Prior Level of Function : Independent/Modified Independent, Driving  Functional Status:  Mobility: Bed Mobility Overal bed mobility: Needs Assistance Bed Mobility: Rolling, Sidelying to Sit Rolling: Supervision Sidelying to sit: Min assist, HOB elevated Sit to sidelying: Min guard General bed mobility comments: Cues for log roll technique with min A for trunk Transfers Overall transfer level: Needs assistance Equipment used: Rolling walker (2 wheels) Transfers: Sit to/from Stand Sit to Stand: Mod assist, Min assist Bed to/from chair/wheelchair/BSC transfer type:: Stand pivot Step pivot transfers: Mod assist General transfer comment: Pt required min to mod A for multiple sit to stands.  Initially with  posterior bias but then anterior.  With standing pt tending to use momentum to power up through initial phase and then pushing with legs.  Return to sitting similar - starts controlled and then required assist to control descent.  She has a range in which she is very weak and uses momentum to power up  and then sits quickly.  Worked on at least 10 sit to stands during session with rest breaks with cues for gradual rise to strengthen (cues to make sure legs in good place, then therapy providing tactile cues to keeps knees apart and not internally rotate hips).  With sitting worked on cueing for controlled descent and therapy provided tactile cues between knees to prevent internal rotation.  Pt has difficulty with controlled descent but showing improvement. Ambulation/Gait Ambulation/Gait assistance: Mod assist, +2 safety/equipment, +2 physical assistance Gait Distance (Feet): 8 Feet Assistive device: Rolling walker (2 wheels) Gait Pattern/deviations: Step-to pattern, Ataxic, Wide base of support, Staggering right, Staggering left General Gait Details: Pt with very ataxic gait requiring cues for step length and assist with weight shifting.  Occasional assist to push RW forward.  Overall, mod A for balance but did provide with assist of 2 to improve pt's confidence/less fear of falling.  Had chair follow. Gait velocity: Decreased Gait velocity interpretation: <1.31 ft/sec, indicative of household ambulator    ADL: ADL Overall ADL's : Needs assistance/impaired Eating/Feeding: Set up, Sitting, Bed level Eating/Feeding Details (indicate cue type and reason): self fed banana peeled for her, drinks with cup with straw and 2 hands Grooming: Oral care, Brushing hair, Wash/dry face, Sitting, Standing Grooming Details (indicate cue type and reason): pt set up toothbrush in sitting, stood for oral care, sat for brushing hair and washing face, assist to reach back of head Upper Body Bathing: Moderate assistance,  Sitting Lower Body Bathing: Total assistance, Sit to/from stand Upper Body Dressing : Sitting, Minimal assistance Lower Body Dressing: Moderate assistance, Sit to/from stand Toilet Transfer: Minimal assistance, BSC/3in1 Toilet Transfer Details (indicate cue type and reason): pt with sensation of need to have BM, incontinent, but finished on [facility], unaware when she was done Toileting- Water quality scientist and Hygiene: Total assistance, Sit to/from stand Functional mobility during ADLs: Moderate assistance, Rolling walker (2 wheels), +2 for safety/equipment General ADL Comments: Mod A overall with the RW for ADL transfers. Good carryover of figure 4 dressing technique. Cueing for reducing knee hyperextension during stand.  Cognition: Cognition Overall Cognitive Status: Within Functional Limits for tasks assessed Orientation Level: Oriented X4 Cognition Arousal/Alertness: Awake/alert Behavior During Therapy: WFL for tasks assessed/performed Overall Cognitive Status: Within Functional Limits for tasks assessed General Comments: Motivated and eager to participate   Blood pressure (!) 147/85, pulse 91, temperature 98 F (36.7 C), temperature source Oral, resp. rate 18, height '5\' 3"'$  (1.6 m), weight 44 kg, SpO2 97 %. Physical Exam Constitutional:      Appearance: She is well-developed and underweight.  Neck:     Comments: Immobilized with C-collar Cardiovascular:     Rate and Rhythm: Tachycardia present.  Neurological:     Mental Status: She is alert and oriented to person, place, and time.     Sensory: Sensory deficit present.     Motor: Weakness present.     Coordination: Coordination abnormal.     Comments: Alert and appropriate without dysarthria, speech clear with normal rate.  Has RUE/RLE weakness>LUE/LLE with ataxia worse on right. Sensory deficits BUE>LLE and from waist down which is improving but still has some saddle anesthesia.      No results found for this or any previous  visit (from the past 48 hour(s)). No results found.    Blood pressure (!) 147/85, pulse 91, temperature 98 F (36.7 C), temperature source Oral, resp. rate 18, height '5\' 3"'$  (1.6 m), weight 44 kg, SpO2 97 %.  Medical  Problem List and Plan: 1. Functional deficits secondary to    -patient may *** shower  -ELOS/Goals: *** 2.  Antithrombotics: -DVT/anticoagulation:  Pharmaceutical: Lovenox  -antiplatelet therapy: N/A 3. Pain Management:  Low dose oxycodone, tylenol and flexeril prn --On gabapentin for  neuropathy.  4. Mood/Behavior/Sleep: LCSW to follow for evaluation and support.  --trazodone prn for insomnia. Continue ativan prn for anxiety.   -antipsychotic agents: N/A 5. Neuropsych/cognition: This patient is capable of making decisions on her own behalf. 6. Skin/Wound Care: Routine pressure relief measures.  7. Fluids/Electrolytes/Nutrition: Monitor I/O. Check CMET in am. 8. Breast cancer with mets to brain/spine (liver/lung?): To start XRT 01/24 per chart review  --abnormal LFTs with A phos-[id number] 9. Hyponatremia: Recheck lytes in am for follow up 10. Leucocytosis: WBC 13.4 on 01/14--> repeat CBC in am.   --Monitor for fevers and other signs of infection.  11. High blood pressure: SBP 140-150 range. Monitor for trends and with activity. .  --Will order orthostatic vitals as dizziness reported with activity 12. Neurogenic bowel: Had BM with use of enema and none for 2-3 days.  --Dose of miralax again today followed by suppository after supper.  13. Neurogenic bladder?: Did have incontinence at admission that's better but has been using purewick and reports that she's getting up wet.   --monitor voiding with PVR/bladder scans. Check UA.     ***  Bary Leriche, PA-C 09/13/2022

## 2022-09-13 NOTE — Progress Notes (Signed)
Physical Therapy Treatment Patient Details Name: Martha [name] MRN: [id number] DOB: [birth year] Today's Date: 09/13/2022   History of Present Illness Pt is 62 yo F with cervical cord compression with myelopathy associated with widely metastatic breast cancer. Now s/p C1-C7 posterior fusion with decompression 1/10. Pt with known brain and spinal mets. PMH of anxiety.    PT Comments    Pt with good improvement today.  She was able to tolerate increased transfers and gait.  Still with ataxia but improved and improved control of ascent and descent from standing with tactile cues.  Required rest breaks for pain control and fatigue.  Also, needs cues for breathing with activity.  Excellent rehab potential.     Recommendations for follow up therapy are one component of a multi-disciplinary discharge planning process, led by the attending physician.  Recommendations may be updated based on patient status, additional functional criteria and insurance authorization.  Follow Up Recommendations  Acute inpatient rehab (3hours/day)     Assistance Recommended at Discharge Frequent or constant Supervision/Assistance  Patient can return home with the following Assistance with cooking/housework;Assist for transportation;Help with stairs or ramp for entrance;A lot of help with walking and/or transfers;A lot of help with bathing/dressing/bathroom   Equipment Recommendations  Other (comment) (TBD)    Recommendations for Other Services Rehab consult     Precautions / Restrictions Precautions Precautions: Cervical;Fall Required Braces or Orthoses: Cervical Brace Cervical Brace: Hard collar Restrictions Weight Bearing Restrictions: No     Mobility  Bed Mobility Overal bed mobility: Needs Assistance Bed Mobility: Rolling, Sidelying to Sit Rolling: Supervision Sidelying to sit: Supervision, Min assist       General bed mobility comments: Cues for log roll technique; first time with  supervision, second rep min A for trunk; required tactile cues for technique    Transfers Overall transfer level: Needs assistance Equipment used: Rolling walker (2 wheels), 2 person hand held assist Transfers: Sit to/from Stand Sit to Stand: Min assist           General transfer comment: Sit to stand with arms over therapist shoulders x 1 with light min A (therapist crouched to keep pts arms below shoulder level).  Sit to stand with RW x 5 with focus on foot placement, controlled rise and descent, therapist providing tactile cues/assist to keep knees apart;  pt had 1 quick descent but able to control other 4.  See balance for further standing    Ambulation/Gait Ambulation/Gait assistance: Min assist, +2 physical assistance Gait Distance (Feet): 25 Feet (25' then 8'x2) Assistive device: Rolling walker (2 wheels), 2 person hand held assist Gait Pattern/deviations: Step-through pattern, Ataxic Gait velocity: decreased     General Gait Details: Did 12' with three musketeer technique (pt's arms below shoulder level) and pt able to walk to door and back with only light weight on therapist.  Improved ataxia and control compared to yesterday.  Then ambulated to sink and back (8'x2) with RW.  Min A to control RW and for posture.  Prior to ambulating addressed pt's balance (anterior balance)   Stairs             Wheelchair Mobility    Modified Rankin (Stroke Patients Only)       Balance Overall balance assessment: Needs assistance Sitting-balance support: No upper extremity supported, Feet supported, Bilateral upper extremity supported Sitting balance-Leahy Scale: Fair Sitting balance - Comments: Prefers UE support but is able to lift UE     Standing balance-Leahy Scale: Poor Standing balance  comment: Pt with tendency to anterior lean , increased lordosis but could correct with time and cues.  Requiring UE support and min -mod A.  With brushing teeth pt able to release UE and  reach but leans onto counter with hips.                            Cognition Arousal/Alertness: Awake/alert Behavior During Therapy: WFL for tasks assessed/performed Overall Cognitive Status: Within Functional Limits for tasks assessed                                 General Comments: Motivated and eager to participate        Exercises      General Comments General comments (skin integrity, edema, etc.): Pt had some c/o dizziness after first bout of gait.  With next time up checked orthostatic BP and was stable. No dizziness this bout. Pt was also holding breath first bout - encouraged deep breathing and talking/singing next bout      Pertinent Vitals/Pain Pain Assessment Pain Assessment: Faces Faces Pain Scale: Hurts a little bit Pain Location: L side, shoulders Pain Descriptors / Indicators: Guarding Pain Intervention(s): Limited activity within patient's tolerance, Monitored during session, Repositioned    Home Living                          Prior Function            PT Goals (current goals can now be found in the care plan section) Progress towards PT goals: Progressing toward goals    Frequency    Min 5X/week      PT Plan Current plan remains appropriate    Co-evaluation PT/OT/SLP Co-Evaluation/Treatment: Yes Reason for Co-Treatment: Complexity of the patient's impairments (multi-system involvement) PT goals addressed during session: Mobility/safety with mobility OT goals addressed during session: ADL's and self-care      AM-PAC PT "6 Clicks" Mobility   Outcome Measure  Help needed turning from your back to your side while in a flat bed without using bedrails?: A Little Help needed moving from lying on your back to sitting on the side of a flat bed without using bedrails?: A Little Help needed moving to and from a bed to a chair (including a wheelchair)?: A Lot Help needed standing up from a chair using your arms  (e.g., wheelchair or bedside chair)?: A Lot Help needed to walk in hospital room?: Total Help needed climbing 3-5 steps with a railing? : Total 6 Click Score: 12    End of Session Equipment Utilized During Treatment: Gait belt;Cervical collar Activity Tolerance: Patient tolerated treatment well Patient left: in bed;with call bell/phone within reach Nurse Communication: Mobility status PT Visit Diagnosis: Unsteadiness on feet (R26.81);Pain     Time: 2694-8546 PT Time Calculation (min) (ACUTE ONLY): 36 min  Charges:  $Gait Training: 8-22 mins                     Abran Richard, PT Acute Rehab [facility] [place] 09/13/2022, 11:27 AM

## 2022-09-13 NOTE — Progress Notes (Signed)
Subjective: Patient reports strength slowly improving  Objective: Vital signs in last 24 hours: Temp:  [97.8 F (36.6 C)-98.2 F (36.8 C)] 98 F (36.7 C) (01/17 0721) Pulse Rate:  [86-97] 91 (01/17 0721) Resp:  [14-18] 18 (01/17 0721) BP: (140-155)/(65-85) 147/85 (01/17 0721) SpO2:  [97 %-99 %] 97 % (01/17 0721)  Intake/Output from previous day: 01/16 0701 - 01/17 0700 In: 480 [P.O.:480] Out: 1600 [Urine:1600] Intake/Output this shift: No intake/output data recorded.  Awake, alert NAD Posterior neck incision dressing c/d 4/5 strength in HG, 4+/5 LE strength  Lab Results: Recent Labs    09/10/22 1056  WBC 13.4*  HGB 13.6  HCT 36.9  PLT 192   BMET Recent Labs    09/10/22 1056  CREATININE <0.30*    Studies/Results: No results found.  Assessment/Plan: S/p C1-7 PCDF for pathologic fracture and cord compression - awaiting CIR   Martha [name] 09/13/2022, 8:38 AM

## 2022-09-13 NOTE — H&P (Addendum)
/   Physical Medicine and Rehabilitation Admission H&P    Chief Complaint  Patient presents with   Functional deficits due to cervical myelopathy/metatatic breast cancer.     HPI: Martha [name] is a 62 year old female with history of left breast CA 01/2018  s/p lumpectomy & XRT, right  breast cancer 04/2021, neck pain w/torticollis treated with PT since 10/02 and follow up MRI C spine showed extensive metastatic disease with pathological Fx at base of dens and C2 right lateral mass with extraosseous tumor at C1 and C2 and degenerative changes C4-C7. She was placed in collar and referred to Dr. Collene Gobble.  PET scan done revealing large volume osseous mets with suspicion of hepatic mets as well as brain mets on MRI.  Dr. Burr Medico  recommended bone biopsy to decide on cancer tx and  goals of care  She underwent C1-C4 fusion with reduction of C2 fracture and bone biopsy on 08/17/22 by Dr. Marcello Moores and was discharged to home 12/23 with plans to start XRT in 1-2 weeks.    Post op seen in ED for BUE pain with spasms treated with steroids and gabapentin. She was admitted on 09/04/22 with increased numbness and weakness in hands progressing to inability to walk as well as decreased sensation from waist down. MRI C/T/L spine done revealing stable fusion but multiple mild to  moderate pathological Fx T2,T5, T6, T10 and T11 as well as mildly progressive spinal stenosis C5 and C6 with cord flattening as well as possible lung mets. Case discussed with Dr. Lanell Persons and Dr. Burr Medico and patient elected on aggressive tx. She underwent laminectomy C4-C7 with extension of previous instrumentation and arthrodesis on 09/06/22.  JP drain removed and she was cleared to start Lovenox for DVT prophylaxis. She has had improvement in sensation BUE as well as strength. Issues with hallucinations felt to be narcotic related and oxycodone dose decreased.  She reports her sensation continues to be altered in her bilateral hands and feet.   She says that she would prefer to use Tylenol primarily to control her pain.  She says she was continent of bowel and bladder prior to her most recent hospital admission.  She does report issues with bladder urgency.  She has been using pure wick at the hospital.  MRI brain done today. PT/OT has been working with patient who continues to be limited by ataxia with sensory deficits, pain, fatigue, posterior lean, dizziness as well as decrease in control of BLE. CIR was recommended due to functional decline.     Review of Systems  Constitutional:  Negative for fever.  HENT:  Negative for hearing loss.   Eyes:  Negative for blurred vision and double vision.  Respiratory:  Negative for cough and shortness of breath.   Cardiovascular:  Negative for chest pain and palpitations.  Gastrointestinal:  Positive for constipation. Negative for abdominal pain, nausea and vomiting.  Genitourinary:  Positive for urgency.  Musculoskeletal:  Positive for myalgias and neck pain. Negative for back pain, falls and joint pain.  Skin:  Negative for itching.  Neurological:  Positive for sensory change, weakness and headaches (some HA last night).  Psychiatric/Behavioral:  Negative for hallucinations. The patient is nervous/anxious. The patient does not have insomnia (now sleeping well).      Past Medical History:  Diagnosis Date   Anxiety    Cancer [facility]) 2022   right breast LCIS   Cancer ([place]) 2019   left breast   Depression    Dysrhythmia  SVT, s/p ablation ~ 2012 in at [facility]   Ectopic pregnancy    Family history of breast cancer    Family history of melanoma    History of radiation therapy 04/22/18-06/03/18   Left Breast, left SCV, axilla 50 Gy in 25 fractions, Left breast boost 10 Gy in 5 fractions.    Hypothyroidism    Personal history of radiation therapy    PONV (postoperative nausea and vomiting)    Thyroid disease     Past Surgical History:  Procedure Laterality Date    APPENDECTOMY     BILATERAL SALPINGECTOMY     BREAST BIOPSY Right 2014   fibroadenoma   BREAST BIOPSY Left 01/16/2018   BREAST BIOPSY Right 02/02/2020   LCIS   BREAST BIOPSY Right 08/04/2020   x2 LCIS   BREAST BIOPSY Right 08/13/2020   x2   BREAST EXCISIONAL BIOPSY Left 1990   benign   BREAST EXCISIONAL BIOPSY Right 02/02/2020   LCIS   BREAST LUMPECTOMY Left 01/22/2018   BREAST LUMPECTOMY WITH AXILLARY LYMPH NODE BIOPSY Left 02/21/2018   Procedure: LEFT BREAST LUMPECTOMY WITH AXILLARY LYMPH NODE BIOPSY;  Surgeon: [name], MD;  Location: [place];  Service: General;  Laterality: Left;   BREAST LUMPECTOMY WITH RADIOACTIVE SEED LOCALIZATION Right 03/18/2020   Procedure: RIGHT BREAST LUMPECTOMY WITH RADIOACTIVE SEED LOCALIZATION;  Surgeon: [name], MD;  Location: [place];  Service: General;  Laterality: Right;   BREAST SURGERY Left 1993   cyst removed   ENDOMETRIAL ABLATION     EYE SURGERY     GLAUCOMA SURGERY Bilateral    POSTERIOR CERVICAL FUSION/FORAMINOTOMY N/A 08/17/2022   Procedure: Cervical One-Cervical Four POSTERIOR CERVICAL FUSION,  Cervical One LAMINECTOMY REDUCTION OF Cervical Two Fracture, Biopsy of Right Cerical Two Pars  Lesion;  Surgeon: Vallarie Mare, MD;  Location: [place];  Service: Neurosurgery;  Laterality: N/A;   POSTERIOR CERVICAL FUSION/FORAMINOTOMY N/A 09/06/2022   Procedure: Posterior Cervical Fusion, Foraminotomy , Cervical Five-Six, Cervical Six-Seven; Extension of  fusion Cervical Four- Thoracic One;  Surgeon: Vallarie Mare, MD;  Location: [place];  Service: Neurosurgery;  Laterality: N/A;   RADIOACTIVE SEED GUIDED EXCISIONAL BREAST BIOPSY Right 04/28/2021   Procedure: RADIOACTIVE SEED GUIDED EXCISIONAL RIGHT BREAST BIOPSY X2;  Surgeon: [name], MD;  Location: [place];  Service: General;  Laterality: Right;   RE-EXCISION OF BREAST LUMPECTOMY Left 03/20/2018   Procedure: RE-EXCISION OF BREAST LUMPECTOMY;  Surgeon: [name], MD;  Location: [place];  Service: General;  Laterality: Left;    Family History  Problem Relation Age of Onset   Heart disease Mother    Pneumonia Father    Hyperlipidemia Sister    Melanoma Sister 62       hx of 5 melanomas   Lung cancer Maternal Uncle        mesothelioma   Pneumonia Maternal Uncle    Dementia Paternal Grandmother    Lung disease Paternal Grandfather    Breast cancer Other        MGMs sister dx < 68   Breast cancer Other        PGFs sister dx < 23   Breast cancer Cousin        mother's mat first cousin, dx < 73    Social History:   Married. Unemployed since last year to care for her mother. Mother now being cared for by other siblings. She  reports that she has been smoking cigarettes. She  has a 20.00 pack-year smoking history. She has never used smokeless tobacco. She reports that she drinks 4 bottles of beer/daily. She reports that she does not use drugs.   Allergies  Allergen Reactions   Morphine And Related Other (See Comments)    "Makes her angry"    Medications Prior to Admission  Medication Sig Dispense Refill   citalopram (CELEXA) 20 MG tablet Take 1 tablet (20 mg total) by mouth daily. 90 tablet 3   cyclobenzaprine (FLEXERIL) 10 MG tablet Take 1 tablet (10 mg total) by mouth 3 (three) times daily as needed for muscle spasms. 30 tablet 0   gabapentin (NEURONTIN) 100 MG capsule Take 1 capsule (100 mg total) by mouth 3 (three) times daily for 15 days. 45 capsule 0   letrozole (FEMARA) 2.5 MG tablet Take 2.5 mg by mouth daily.     levothyroxine (SYNTHROID) 75 MCG tablet Take 1 tablet (75 mcg total) by mouth daily before breakfast. 90 tablet 3   OVER THE COUNTER MEDICATION Take 1 tablet by mouth daily. Protandim Supplement     oxyCODONE-acetaminophen (PERCOCET) 5-325 MG tablet Take 1-2 tablets by mouth every 4 (four) hours as needed for severe pain (postoperative pain). (Patient taking differently: Take 0.5-1 tablets by mouth  every 4 (four) hours.) 20 tablet 0   Probiotic Product (PROBIOTIC PO) Take 1 capsule by mouth daily.     TYLENOL 500 MG tablet Take 500 mg by mouth every 6 (six) hours as needed for mild pain or headache.         Blood pressure (!) 152/76, pulse 95, temperature 98.3 F (36.8 C), resp. rate 17, SpO2 97 %.  General: Alert and oriented x 4, No apparent distress HEENT: Head is normocephalic, atraumatic, PERRLA, EOMI, sclera anicteric, oral mucosa pink and moist, dentition intact, ext ear canals clear,  Neck: Supple without JVD or lymphadenopathy, wearing cervical collar Heart: Tachycardic. No murmurs rubs or gallops Chest: CTA bilaterally without wheezes, rales, or rhonchi; no distress Abdomen: Soft, non-tender, non-distended, bowel sounds positive. Extremities: No clubbing, cyanosis, or edema. Pulses are 2+ Psych: Pt's affect is appropriate. Pt is cooperative.  Very pleasant Skin: Clean and intact without signs of breakdown, posterior cervical spine incision with dry dressing in place Neuro:  Alert and appropriate without dysarthria, speech clear with normal rate.  Bilateral ataxia worse on right.  Altered sensation to light touch primarily in the hands and feet bilaterally.  Cranial nerves II through XII intact, Right upper extremity shoulder abduction 1-2, elbow flexion 4- out of 5, elbow extension 2 out of 5, finger flexion 4-/5 Left upper extremity shoulder abduction 1-2, elbow flexion 4- out of 5, elbow extension 1-2 out of 5, finger flexion 4-/5 Left lower extremity strength 4 out of 5, right lower extremity strength 4- out of 5 Musculoskeletal: No joint swelling or tenderness noted Left wrist PIV   Home: Home Living Family/patient expects to be discharged to:: Private residence Living Arrangements: Spouse/significant other Available Help at Discharge: Family, Available 24 hours/day Type of Home: House Home Access: Stairs to enter [employer] of Steps: 2 Entrance  Stairs-Rails: Can reach both Home Layout: One level Alternate Level Stairs-Number of Steps: 1 step down into living room Alternate Level Stairs-Rails: Right Bathroom Shower/Tub: Curtain, Multimedia programmer: Standard Bathroom Accessibility: Yes Home Equipment: Rollator (4 wheels), Rolling Walker (2 wheels), Wheelchair - manual, Shower seat Additional Comments: adjustable bed   Functional History: Prior Function Prior Level of Function : Independent/Modified Independent, Driving  Functional Status:  Mobility: Bed Mobility Overal bed mobility: Needs Assistance Bed Mobility: Rolling, Sidelying to Sit Rolling: Supervision Sidelying to sit: Min assist, HOB elevated Sit to sidelying: Min guard General bed mobility comments: Cues for log roll technique with min A for trunk Transfers Overall transfer level: Needs assistance Equipment used: Rolling walker (2 wheels) Transfers: Sit to/from Stand Sit to Stand: Mod assist, Min assist Bed to/from chair/wheelchair/BSC transfer type:: Stand pivot Step pivot transfers: Mod assist General transfer comment: Pt required min to mod A for multiple sit to stands.  Initially with posterior bias but then anterior.  With standing pt tending to use momentum to power up through initial phase and then pushing with legs.  Return to sitting similar - starts controlled and then required assist to control descent.  She has a range in which she is very weak and uses momentum to power up and then sits quickly.  Worked on at least 10 sit to stands during session with rest breaks with cues for gradual rise to strengthen (cues to make sure legs in good place, then therapy providing tactile cues to keeps knees apart and not internally rotate hips).  With sitting worked on cueing for controlled descent and therapy provided tactile cues between knees to prevent internal rotation.  Pt has difficulty with controlled descent but showing  improvement. Ambulation/Gait Ambulation/Gait assistance: Mod assist, +2 safety/equipment, +2 physical assistance Gait Distance (Feet): 8 Feet Assistive device: Rolling walker (2 wheels) Gait Pattern/deviations: Step-to pattern, Ataxic, Wide base of support, Staggering right, Staggering left General Gait Details: Pt with very ataxic gait requiring cues for step length and assist with weight shifting.  Occasional assist to push RW forward.  Overall, mod A for balance but did provide with assist of 2 to improve pt's confidence/less fear of falling.  Had chair follow. Gait velocity: Decreased Gait velocity interpretation: <1.31 ft/sec, indicative of household ambulator   ADL: ADL Overall ADL's : Needs assistance/impaired Eating/Feeding: Set up, Sitting, Bed level Eating/Feeding Details (indicate cue type and reason): self fed banana peeled for her, drinks with cup with straw and 2 hands Grooming: Oral care, Brushing hair, Wash/dry face, Sitting, Standing Grooming Details (indicate cue type and reason): pt set up toothbrush in sitting, stood for oral care, sat for brushing hair and washing face, assist to reach back of head Upper Body Bathing: Moderate assistance, Sitting Lower Body Bathing: Total assistance, Sit to/from stand Upper Body Dressing : Sitting, Minimal assistance Lower Body Dressing: Moderate assistance, Sit to/from stand Toilet Transfer: Minimal assistance, BSC/3in1 Toilet Transfer Details (indicate cue type and reason): pt with sensation of need to have BM, incontinent, but finished on [facility], unaware when she was done Toileting- Water quality scientist and Hygiene: Total assistance, Sit to/from stand Functional mobility during ADLs: Moderate assistance, Rolling walker (2 wheels), +2 for safety/equipment General ADL Comments: Mod A overall with the RW for ADL transfers. Good carryover of figure 4 dressing technique. Cueing for reducing knee hyperextension during stand.    Cognition: Cognition Overall Cognitive Status: Within Functional Limits for tasks assessed Orientation Level: Oriented X4 Cognition Arousal/Alertness: Awake/alert Behavior During Therapy: WFL for tasks assessed/performed Overall Cognitive Status: Within Functional Limits for tasks assessed General Comments: Motivated and eager to participate Does not seem to call me back Results for orders placed or performed during the hospital encounter of 09/13/22 (from the past 48 hour(s))  Urinalysis, Routine w reflex microscopic     Status: Abnormal   Collection Time: 09/13/22  2:30 PM  Result  Value Ref Range   Color, Urine AMBER (A) YELLOW    Comment: BIOCHEMICALS MAY BE AFFECTED BY COLOR   APPearance HAZY (A) CLEAR   Specific Gravity, Urine 1.028 1.005 - 1.030   pH 5.0 5.0 - 8.0   Glucose, UA NEGATIVE NEGATIVE mg/dL   Hgb urine dipstick NEGATIVE NEGATIVE   Bilirubin Urine NEGATIVE NEGATIVE   Ketones, ur NEGATIVE NEGATIVE mg/dL   Protein, ur 30 (A) NEGATIVE mg/dL   Nitrite NEGATIVE NEGATIVE   Leukocytes,Ua NEGATIVE NEGATIVE   RBC / HPF 0-5 0 - 5 RBC/hpf   WBC, UA 0-5 0 - 5 WBC/hpf   Bacteria, UA RARE (A) NONE SEEN   Squamous Epithelial / HPF 0-5 0 - 5 /HPF   Mucus PRESENT     Comment: Performed at [facility] [address] [place], [place] [address]   VAS [place] LOWER EXTREMITY VENOUS (DVT)  Result Date: 09/13/2022  Lower Venous DVT Study Patient Name:  SALLYANNE BIRKHEAD [facility]  Date of Exam:   09/13/2022 Medical Rec #: [id number]              Accession #:    [id number] Date of Birth: 1961-02-25              Patient Gender: F Patient Age:   80 years Exam Location:  [facility] Procedure:      VAS [place] LOWER EXTREMITY VENOUS (DVT) Referring Phys: PAMELA LOVE --------------------------------------------------------------------------------  Indications: Cervical myelopathy.  Risk Factors: None identified. Comparison Study: No prior studies. Performing Technologist: Oliver Hum RVT   Examination Guidelines: A complete evaluation includes B-mode imaging, spectral Doppler, color Doppler, and power Doppler as needed of all accessible portions of each vessel. Bilateral testing is considered an integral part of a complete examination. Limited examinations for reoccurring indications may be performed as noted. The reflux portion of the exam is performed with the patient in reverse Trendelenburg.  +---------+---------------+---------+-----------+----------+--------------+ RIGHT    CompressibilityPhasicitySpontaneityPropertiesThrombus Aging +---------+---------------+---------+-----------+----------+--------------+ CFV      Full           Yes      Yes                                 +---------+---------------+---------+-----------+----------+--------------+ SFJ      Full                                                        +---------+---------------+---------+-----------+----------+--------------+ FV Prox  Full                                                        +---------+---------------+---------+-----------+----------+--------------+ FV Mid   Full                                                        +---------+---------------+---------+-----------+----------+--------------+ FV DistalFull                                                        +---------+---------------+---------+-----------+----------+--------------+  PFV      Full                                                        +---------+---------------+---------+-----------+----------+--------------+ POP      Full           Yes      Yes                                 +---------+---------------+---------+-----------+----------+--------------+ PTV      Full                                                        +---------+---------------+---------+-----------+----------+--------------+ PERO     Full                                                         +---------+---------------+---------+-----------+----------+--------------+   +---------+---------------+---------+-----------+----------+--------------+ LEFT     CompressibilityPhasicitySpontaneityPropertiesThrombus Aging +---------+---------------+---------+-----------+----------+--------------+ CFV      Full           Yes      Yes                                 +---------+---------------+---------+-----------+----------+--------------+ SFJ      Full                                                        +---------+---------------+---------+-----------+----------+--------------+ FV Prox  Full                                                        +---------+---------------+---------+-----------+----------+--------------+ FV Mid   Full                                                        +---------+---------------+---------+-----------+----------+--------------+ FV DistalFull                                                        +---------+---------------+---------+-----------+----------+--------------+ PFV      Full                                                        +---------+---------------+---------+-----------+----------+--------------+  POP      Full           Yes      Yes                                 +---------+---------------+---------+-----------+----------+--------------+ PTV      Full                                                        +---------+---------------+---------+-----------+----------+--------------+ PERO     Full                                                        +---------+---------------+---------+-----------+----------+--------------+    Summary: RIGHT: - There is no evidence of deep vein thrombosis in the lower extremity.  - No cystic structure found in the popliteal fossa.  LEFT: - There is no evidence of deep vein thrombosis in the lower extremity.  - No cystic structure found in the popliteal fossa.   *See table(s) above for measurements and observations.    Preliminary    MR BRAIN W WO CONTRAST  Result Date: 09/13/2022 CLINICAL DATA:  Metastatic disease evaluation; 3T SRS Protocol for radiation treatment planning. EXAM: MRI HEAD WITHOUT AND WITH CONTRAST TECHNIQUE: Multiplanar, multiecho pulse sequences of the brain and surrounding structures were obtained without and with intravenous contrast. CONTRAST:  19m GADAVIST GADOBUTROL 1 MMOL/ML IV SOLN COMPARISON:  MRI of the brain August 25, 2022; MRI of the cervical spine January 9 24. FINDINGS: Brain: A total of 22 enhancing lesions are identified in the current study, annotated on series 1100. New lesions are marked with double arrows. New lesions: Right cerebellar hemisphere, 1 mm, image 122. Left cerebellar hemisphere, 1 mm, image 139 Enlarged lesions: Left cerebellar floccule, 12 mm (10 mm on prior), image 127 Right cerebellar hemisphere, 5 mm (3 mm on prior), image 122 Right occipital lobe: 4 mm (3 mm on prior), image 146 Left occipital lobe, 3 mm (2 mm on prior), image 159 Left occipital lobe, 7 mm (5 mm on prior), image 161 Left frontal operculum, 2 mm (1 mm on prior) image 172 Left occipital lobe, 2 mm (1 mm on prior), image 173 Right subinsular region, 5 mm (3 mm on prior) image 176 Right posterior temporal, 3 mm) 1 mm on prior), image 177 Left anterior cingulate, 3 mm (2 mm on prior), image 193 Left posterior frontal, 2.5 mm (1.5 mm on prior) image 195 Right parietal, 4 mm (3 mm on prior), image 196 Left parietal, 1.5 mm (less than 1 mm on prior), image 200 Left frontal lobe, 2 mm (less than 1 mm on prior), image 203 Left parietal lobe, 2 mm (less than 1 mm on prior), image 213 Right frontal lobe, 1.5 mm (less than 1 mm on prior), image 222 Unchanged lesions: Right cerebellar hemisphere, 1 mm, image 122 Right temporal lobe, 3 mm, image 145 Left posterior temporal, 3 mm, image 184 Left periventricular, 2 mm, image 192 Mild surrounding edema is seen  associated with the the 7 mm medial left occipital lobe lesion. No significant vasogenic edema associated  with the other lesions. No acute infarct, hydrocephalus or extra-axial collection. Vascular: Normal flow voids. Skull and upper cervical spine: Diffuse metastatic disease to the cervical spine to the cervical spine and occipital condyles, better evaluated prior MRI of the cervical spine. Multiple enhancing metastatic lesions are also seen scattered in the calvarium. Sinuses/Orbits: Negative. Other: None. IMPRESSION: Findings consistent with disease progression with mild increase in size of many lesions and development of 2 new lesions, as detailed above. Electronically Signed   By: Pedro Earls M.D.   On: 09/13/2022 14:15      Blood pressure (!) 152/76, pulse 95, temperature 98.3 F (36.8 C), resp. rate 17, SpO2 97 %.  Medical Problem List and Plan: 1. Functional deficits secondary to incomplete nontraumatic quadriplegia due to metastatic breast cancer    -patient may  shower  -ELOS/Goals: 12 to 14 days, PT OT min assist  -Admit to CIR 2.  Antithrombotics: -DVT/anticoagulation:  Pharmaceutical: Lovenox   -antiplatelet therapy: N/A 3. Pain Management:  Low dose oxycodone, tylenol 650 every 4 hours as needed and flexeril prn --On gabapentin 100 mg 3 times daily for  neuropathy.  4. Mood/Behavior/Sleep: LCSW to follow for evaluation and support.  --trazodone prn for insomnia. Continue ativan prn for anxiety.  Continue Celexa for mood.  -antipsychotic agents: N/A 5. Neuropsych/cognition: This patient is capable of making decisions on her own behalf. 6. Skin/Wound Care: Routine pressure relief measures.  7. Fluids/Electrolytes/Nutrition: Monitor I/O. Check CMET in am. 8. Breast cancer with mets to brain/spine (liver/lung?): To start XRT 01/24 per chart review  --abnormal LFTs with A phos-[id number]  -Followed by Dr. Truitt Merle oncology 9. Hyponatremia: Mild with sodium at 134.   Recheck lytes in am for follow up 10. Leucocytosis: WBC 13.4 on 01/14--> repeat CBC in am.   --Monitor for fevers and other signs of infection.  11. High blood pressure: SBP 140-150 range. Monitor for trends and with activity. .  --Will order orthostatic vitals as dizziness reported with activity 12. Neurogenic bowel: Had BM with use of enema and none for 2-3 days.  --Dose of miralax again today followed by suppository after supper.  -Dulcolax suppository daily, MiraLAX as needed, Senokot daily, sorbitol as needed 13. Neurogenic bladder: Did have incontinence at admission that's better but has been using purewick and reports that she's getting up wet.   --monitor voiding with PVR/bladder scans. Check UA.  14 hypothyroidism.  Continue Synthroid 75 mcg daily yeah  Bary Leriche, PA-C 09/13/2022  I have personally performed a face to face diagnostic evaluation of this patient and formulated the key components of the plan.  Additionally, I have personally reviewed laboratory data, imaging studies, as well as relevant notes and concur with the physician assistant's documentation above.  The patient's status has not changed from the original H&P.  Any changes in documentation from the acute care chart have been noted above.  Jennye Boroughs, MD, Mellody Drown

## 2022-09-13 NOTE — Discharge Summary (Signed)
  Physician Discharge Summary  Patient ID: Martha [name] MRN: [id number] DOB/AGE: [birth year] 62 y.o.  Admit date: 09/04/2022 Discharge date: 09/13/2022  Admission Diagnoses:  Metastatic spine disease with cord compression  Discharge Diagnoses:  Same Principal Problem:   Paresthesia and weakness of both lower extremities Active Problems:   Hypothyroidism   Malignant neoplasm of lower-inner quadrant of left breast in female, estrogen receptor positive ([place])   Cancer, metastatic to bone ([name])   Elevated alkaline phosphatase level   Discharged Condition: Stable  Hospital Course:  Martha [name] is a 62 y.o. female with widely metastatic breast cancer and recent C1-4 posterior fusion for severe pathologic fracture of C2 who developed rapidly progressive myelopathy related to cord compression at C5 and C6.  After interdisciplinary discussion, treatment options were discussed with her and she wished to proceed with decompression and fusion for palliation.  C1-7 posterior fusion and decompression was performed and postoperatively, patient was admitted to the floor.  There, she worked with physical therapy and Occupational Therapy.  She felt her strength, balance, and gait were slowly improving.  She was deemed a good candidate for rehabilitation admission.  She was deemed ready for discharge on 09/13/2022.  Treatments: Surgery -C1-7 posterior fusion, C5-7 decompression  Discharge Exam: Blood pressure (!) 147/85, pulse 91, temperature 98 F (36.7 C), temperature source Oral, resp. rate 18, height '5\' 3"'$  (1.6 m), weight 44 kg, SpO2 97 %. Awake, alert, oriented Speech fluent, appropriate CN grossly intact 4/5 handgrip bilaterally, 4+/5 proximal upper extremity and leg strength bilaterally Wound c/d/i  Disposition: Discharge disposition: [place] Not Defined        Allergies as of 09/13/2022       Reactions   Morphine And Related Other (See  Comments)   "Makes her angry"        Medication List     STOP taking these medications    predniSONE 10 MG (21) Tbpk tablet Commonly known as: STERAPRED UNI-PAK 21 TAB   traMADol 50 MG tablet Commonly known as: ULTRAM       TAKE these medications    citalopram 20 MG tablet Commonly known as: CELEXA Take 1 tablet (20 mg total) by mouth daily.   cyclobenzaprine 10 MG tablet Commonly known as: FLEXERIL Take 1 tablet (10 mg total) by mouth 3 (three) times daily as needed for muscle spasms.   gabapentin 100 MG capsule Commonly known as: Neurontin Take 1 capsule (100 mg total) by mouth 3 (three) times daily for 15 days.   letrozole 2.5 MG tablet Commonly known as: FEMARA Take 2.5 mg by mouth daily.   levothyroxine 75 MCG tablet Commonly known as: SYNTHROID Take 1 tablet (75 mcg total) by mouth daily before breakfast.   OVER THE COUNTER MEDICATION Take 1 tablet by mouth daily. Protandim Supplement   oxyCODONE-acetaminophen 5-325 MG tablet Commonly known as: Percocet Take 1-2 tablets by mouth every 4 (four) hours as needed for severe pain (postoperative pain). What changed:  how much to take when to take this   PROBIOTIC PO Take 1 capsule by mouth daily.   TYLENOL 500 MG tablet Generic drug: acetaminophen Take 500 mg by mouth every 6 (six) hours as needed for mild pain or headache.         Signed: Vallarie Mare 09/13/2022, 12:19 PM

## 2022-09-14 ENCOUNTER — Ambulatory Visit: Payer: BC Managed Care – PPO

## 2022-09-14 DIAGNOSIS — G8250 Quadriplegia, unspecified: Secondary | ICD-10-CM | POA: Insufficient documentation

## 2022-09-14 DIAGNOSIS — G959 Disease of spinal cord, unspecified: Secondary | ICD-10-CM | POA: Diagnosis not present

## 2022-09-14 LAB — CBC WITH DIFFERENTIAL/PLATELET
Abs Immature Granulocytes: 0.28 10*3/uL — ABNORMAL HIGH (ref 0.00–0.07)
Basophils Absolute: 0 10*3/uL (ref 0.0–0.1)
Basophils Relative: 0 %
Eosinophils Absolute: 0.1 10*3/uL (ref 0.0–0.5)
Eosinophils Relative: 1 %
HCT: 39 % (ref 36.0–46.0)
Hemoglobin: 13.8 g/dL (ref 12.0–15.0)
Immature Granulocytes: 3 %
Lymphocytes Relative: 7 %
Lymphs Abs: 0.7 10*3/uL (ref 0.7–4.0)
MCH: 31.9 pg (ref 26.0–34.0)
MCHC: 35.4 g/dL (ref 30.0–36.0)
MCV: 90.1 fL (ref 80.0–100.0)
Monocytes Absolute: 0.6 10*3/uL (ref 0.1–1.0)
Monocytes Relative: 7 %
Neutro Abs: 7.6 10*3/uL (ref 1.7–7.7)
Neutrophils Relative %: 82 %
Platelets: 265 10*3/uL (ref 150–400)
RBC: 4.33 MIL/uL (ref 3.87–5.11)
RDW: 13.1 % (ref 11.5–15.5)
WBC: 9.3 10*3/uL (ref 4.0–10.5)
nRBC: 0 % (ref 0.0–0.2)

## 2022-09-14 LAB — COMPREHENSIVE METABOLIC PANEL
ALT: 73 U/L — ABNORMAL HIGH (ref 0–44)
AST: 73 U/L — ABNORMAL HIGH (ref 15–41)
Albumin: 3.3 g/dL — ABNORMAL LOW (ref 3.5–5.0)
Alkaline Phosphatase: 1078 U/L — ABNORMAL HIGH (ref 38–126)
Anion gap: 10 (ref 5–15)
BUN: 11 mg/dL (ref 8–23)
CO2: 26 mmol/L (ref 22–32)
Calcium: 9.1 mg/dL (ref 8.9–10.3)
Chloride: 95 mmol/L — ABNORMAL LOW (ref 98–111)
Creatinine, Ser: 0.38 mg/dL — ABNORMAL LOW (ref 0.44–1.00)
GFR, Estimated: >60 mL/min (ref >60)
Glucose, Bld: 107 mg/dL — ABNORMAL HIGH (ref 70–99)
Potassium: 3.3 mmol/L — ABNORMAL LOW (ref 3.5–5.1)
Sodium: 131 mmol/L — ABNORMAL LOW (ref 135–145)
Total Bilirubin: 0.8 mg/dL (ref 0.3–1.2)
Total Protein: 6.1 g/dL — ABNORMAL LOW (ref 6.5–8.1)

## 2022-09-14 MED ORDER — POTASSIUM CHLORIDE CRYS ER 20 MEQ PO TBCR
40.0000 meq | EXTENDED_RELEASE_TABLET | Freq: Once | ORAL | Status: AC
  Administered 2022-09-14: 40 meq via ORAL
  Filled 2022-09-14: qty 2

## 2022-09-14 NOTE — Evaluation (Signed)
Occupational Therapy Assessment and Plan  Patient Details  Name: Martha [name] MRN: [id number] Date of Birth: 06/25/1961  OT Diagnosis: abnormal posture, acute pain, ataxia, lumbago (low back pain), muscle weakness (generalized), pain in joint, and pain in cervical spine Rehab Potential: Rehab Potential (ACUTE ONLY): Fair ELOS: 2-2.5 weeks   Today's Date: 09/14/2022 OT Individual Time: 3267-1245 OT Individual Time Calculation (min): 72 min     Hospital Problem: Principal Problem:   Acute incomplete quadriplegia ([place]) Active Problems:   Cervical myelopathy ([place])   Past Medical History:  Past Medical History:  Diagnosis Date   Anxiety    Cancer ([place]) 2022   right breast LCIS   Cancer ([place]) 2019   left breast   Depression    Dysrhythmia    SVT, s/p ablation ~ 2012 in at [facility]   Ectopic pregnancy    Family history of breast cancer    Family history of melanoma    History of radiation therapy 04/22/18-06/03/18   Left Breast, left SCV, axilla 50 Gy in 25 fractions, Left breast boost 10 Gy in 5 fractions.    Hypothyroidism    Personal history of radiation therapy    PONV (postoperative nausea and vomiting)    Thyroid disease    Past Surgical History:  Past Surgical History:  Procedure Laterality Date   APPENDECTOMY     BILATERAL SALPINGECTOMY     BREAST BIOPSY Right 2014   fibroadenoma   BREAST BIOPSY Left 01/16/2018   BREAST BIOPSY Right 02/02/2020   LCIS   BREAST BIOPSY Right 08/04/2020   x2 LCIS   BREAST BIOPSY Right 08/13/2020   x2   BREAST EXCISIONAL BIOPSY Left 1990   benign   BREAST EXCISIONAL BIOPSY Right 02/02/2020   LCIS   BREAST LUMPECTOMY Left 01/22/2018   BREAST LUMPECTOMY WITH AXILLARY LYMPH NODE BIOPSY Left 02/21/2018   Procedure: LEFT BREAST LUMPECTOMY WITH AXILLARY LYMPH NODE BIOPSY;  Surgeon: [name], MD;  Location: [place];  Service: General;  Laterality: Left;   BREAST LUMPECTOMY WITH RADIOACTIVE SEED LOCALIZATION  Right 03/18/2020   Procedure: RIGHT BREAST LUMPECTOMY WITH RADIOACTIVE SEED LOCALIZATION;  Surgeon: [name], MD;  Location: [place];  Service: General;  Laterality: Right;   BREAST SURGERY Left 1993   cyst removed   ENDOMETRIAL ABLATION     EYE SURGERY     GLAUCOMA SURGERY Bilateral    POSTERIOR CERVICAL FUSION/FORAMINOTOMY N/A 08/17/2022   Procedure: Cervical One-Cervical Four POSTERIOR CERVICAL FUSION,  Cervical One LAMINECTOMY REDUCTION OF Cervical Two Fracture, Biopsy of Right Cerical Two Pars  Lesion;  Surgeon: Vallarie Mare, MD;  Location: [place];  Service: Neurosurgery;  Laterality: N/A;   POSTERIOR CERVICAL FUSION/FORAMINOTOMY N/A 09/06/2022   Procedure: Posterior Cervical Fusion, Foraminotomy , Cervical Five-Six, Cervical Six-Seven; Extension of  fusion Cervical Four- Thoracic One;  Surgeon: Vallarie Mare, MD;  Location: [place];  Service: Neurosurgery;  Laterality: N/A;   RADIOACTIVE SEED GUIDED EXCISIONAL BREAST BIOPSY Right 04/28/2021   Procedure: RADIOACTIVE SEED GUIDED EXCISIONAL RIGHT BREAST BIOPSY X2;  Surgeon: [name], MD;  Location: [place];  Service: General;  Laterality: Right;   RE-EXCISION OF BREAST LUMPECTOMY Left 03/20/2018   Procedure: RE-EXCISION OF BREAST LUMPECTOMY;  Surgeon: [name], MD;  Location: [place];  Service: General;  Laterality: Left;    Assessment & Plan Clinical Impression:  Martha [name] is a 62 year old female with history of left breast CA 01/2018  s/p  lumpectomy & XRT, right  breast cancer 04/2021, neck pain w/torticollis treated with PT since 10/02 and follow up MRI C spine showed extensive metastatic disease with pathological Fx at base of dens and C2 right lateral mass with extraosseous tumor at C1 and C2 and degenerative changes C4-C7. She was placed in collar and referred to Dr. Collene Gobble.  PET scan done revealing large volume osseous mets with suspicion of hepatic mets as well as brain  mets on MRI.  Dr. Burr Medico  recommended bone biopsy to decide on cancer tx and  goals of care  She underwent C1-C4 fusion with reduction of C2 fracture and bone biopsy on 08/17/22 by Dr. Marcello Moores and was discharged to home 12/23 with plans to start XRT in 1-2 weeks.     Post op seen in ED for BUE pain with spasms treated with steroids and gabapentin. She was admitted on 09/04/22 with increased numbness and weakness in hands progressing to inability to walk as well as decreased sensation from waist down. MRI C/T/L spine done revealing stable fusion but multiple mild to  moderate pathological Fx T2,T5, T6, T10 and T11 as well as mildly progressive spinal stenosis C5 and C6 with cord flattening as well as possible lung mets. Case discussed with Dr. Lanell Persons and Dr. Burr Medico and patient elected on aggressive tx. She underwent laminectomy C4-C7 with extension of previous instrumentation and arthrodesis on 09/06/22.  JP drain removed and she was cleared to start Lovenox for DVT prophylaxis. She has had improvement in sensation BUE as well as strength. Issues with hallucinations felt to be narcotic related and oxycodone dose decreased.  She reports her sensation continues to be altered in her bilateral hands and feet.  She says that she would prefer to use Tylenol primarily to control her pain.  She says she was continent of bowel and bladder prior to her most recent hospital admission.  She does report issues with bladder urgency.  She has been using pure wick at the hospital.  MRI brain done today. PT/OT has been working with patient who continues to be limited by ataxia with sensory deficits, pain, fatigue, posterior lean, dizziness as well as decrease in control of BLE. CIR was recommended due to functional decline. Patient transferred to CIR on 09/13/2022 .    Patient currently requires max with basic self-care skills and IADL secondary to muscle weakness, decreased cardiorespiratoy endurance, ataxia and decreased  coordination, and decreased sitting balance and decreased standing balance.  Prior to hospitalization, patient could complete all self-care independently.  Patient will benefit from skilled intervention to decrease level of assist with basic self-care skills, increase independence with basic self-care skills, and increase level of independence with iADL prior to discharge home with care partner.  Anticipate patient will require 24 hour supervision and minimal physical assistance and follow up home health.  OT - End of Session Activity Tolerance: Tolerates < 10 min activity, no significant change in vital signs Endurance Deficit: Yes Endurance Deficit Description: Required rest breaks and increased time during session OT Assessment Rehab Potential (ACUTE ONLY): Fair OT Barriers to Discharge: Home environment access/layout;Other (comments);Nutrition means;Neurogenic Bowel & Bladder (Cancer) OT Patient demonstrates impairments in the following area(s): Balance;Endurance;Nutrition;Pain;Sensory OT Basic ADL's Functional Problem(s): Bathing;Dressing;Toileting;Eating;Grooming OT Advanced ADL's Functional Problem(s): Simple Meal Preparation OT Transfers Functional Problem(s): Toilet;Tub/Shower OT Additional Impairment(s): Fuctional Use of Upper Extremity OT Plan OT Intensity: Minimum of 1-2 x/day, 45 to 90 minutes OT Frequency: 5 out of 7 days OT Duration/Estimated Length of Stay: 2-2.5  weeks OT Treatment/Interventions: Balance/vestibular training;Discharge planning;Pain management;Self Care/advanced ADL retraining;Therapeutic Activities;UE/LE Coordination activities;Disease mangement/prevention;Functional mobility training;Patient/family education;Therapeutic Exercise;DME/adaptive equipment instruction;Neuromuscular re-education;Psychosocial support;UE/LE Strength taining/ROM;Wheelchair propulsion/positioning OT Self Feeding Anticipated Outcome(s): Mod I OT Basic Self-Care Anticipated Outcome(s):  Supervision, Min A OT Toileting Anticipated Outcome(s): Supervision OT Bathroom Transfers Anticipated Outcome(s): Supervision OT Recommendation Recommendations for Other Services: Neuropsych consult;Therapeutic Recreation consult Therapeutic Recreation Interventions: Pet therapy;Stress management Patient destination: Home Follow Up Recommendations: 24 hour supervision/assistance;Home health OT Equipment Recommended: 3 in 1 bedside comode;To be determined   OT Evaluation Precautions/Restrictions  Precautions Precautions: Cervical;Fall Precaution Comments: verbally reviewed and pt able to recall with minimal cueing Required Braces or Orthoses: Cervical Brace Cervical Brace: Hard collar Restrictions Weight Bearing Restrictions: No Home Living/Prior Functioning Home Living Family/patient expects to be discharged to:: Private residence Living Arrangements: Spouse/significant other Available Help at Discharge: Family, Friend(s), Available PRN/intermittently (Husband works but has flexible job hours, and lots of family and friends available to assist) Type of Home: House Home Access: Stairs to enter Technical brewer of Steps: 2 Entrance Stairs-Rails: Can reach both Home Layout: One level Alternate Level Stairs-Number of Steps: 1 step down into living room, with grab bar Alternate Level Stairs-Rails: Right Bathroom Shower/Tub: Walk-in shower, Curtain (small step to enter, shower chair) Bathroom Toilet: Standard (uses a belt to assist self up) Bathroom Accessibility: Yes Additional Comments: Has RW, rollator, WC from her mother, shower chair and adjustable bed (HOB and foot of bed but not height wise)  Lives With: Spouse IADL History Homemaking Responsibilities: Yes Meal Prep Responsibility: Primary Laundry Responsibility: Primary Current License: Yes Mode of Transportation: Car Occupation: Other (comment) (filing for disability but was taking care 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) Leisure and  Hobbies: crafts, gardening Prior Function Level of Independence: Independent with gait, Independent with transfers, Independent with basic ADLs, Independent with homemaking with ambulation  Able to Take Stairs?: Yes Driving: Yes Vocation: 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, now applying to disability) Vision Baseline Vision/History: 0 No visual deficits Ability to See in Adequate Light: 0 Adequate Patient Visual Report: No change from baseline Vision Assessment?: No apparent visual deficits Perception  Perception: Within Functional Limits Praxis Praxis: Intact Cognition Cognition Overall Cognitive Status: Within Functional Limits for tasks assessed Arousal/Alertness: Awake/alert Orientation Level: Person;Place;Situation Person: Oriented Place: Oriented Situation: Oriented Memory: Appears intact Awareness: Appears intact Problem Solving: Appears intact Safety/Judgment: Appears intact Brief Interview for Mental Status (BIMS) Repetition of Three Words (First Attempt): 3 Temporal Orientation: Year: Correct Temporal Orientation: Month: Accurate within 5 days Temporal Orientation: Day: Correct Recall: "Sock": Yes, no cue required Recall: "Blue": Yes, no cue required Recall: "Bed": Yes, no cue required BIMS Summary Score: 15 Sensation Sensation Light Touch: Appears Intact Hot/Cold: Appears Intact Proprioception: Appears Intact Stereognosis: Not tested Additional Comments: Intact sensation BUE/BLE with assessment however reports numbness/tingling with digits on both Coordination Gross Motor Movements are Fluid and Coordinated: No Fine Motor Movements are Fluid and Coordinated: No Coordination and Movement Description: ataxic and decreased balance strategies Finger Nose Finger Test: ataxic bilaterally L>R Motor  Motor Motor: Ataxia  Trunk/Postural Assessment  Cervical Assessment Cervical Assessment: Exceptions to [facility] Thoracic Assessment Thoracic Assessment:  Exceptions to [facility] (rounded shoulders) Lumbar Assessment Lumbar Assessment: Exceptions to [facility] (posterior pelvic tilt in sitting) Postural Control Postural Control: Deficits on evaluation (anterior and posterior deviations in sitting and stance)  Balance Balance Balance Assessed: Yes Static Sitting Balance Static Sitting - Balance Support: Feet supported;No upper extremity supported Static Sitting - Level of Assistance: 5: Stand by assistance (  CGA) Dynamic Sitting Balance Dynamic Sitting - Balance Support: Feet supported;No upper extremity supported Dynamic Sitting - Level of Assistance: 4: Min Insurance risk surveyor Standing - Balance Support: During functional activity;Bilateral upper extremity supported Static Standing - Level of Assistance: 5: Stand by assistance (CGA) Dynamic Standing Balance Dynamic Standing - Balance Support: During functional activity;No upper extremity supported;Bilateral upper extremity supported Dynamic Standing - Level of Assistance: 4: Min assist Extremity/Trunk Assessment RUE Assessment RUE Assessment: Exceptions to [facility] Active Range of Motion (AROM) Comments: Limited shoulder flexion due to cervical precautions however proximal weakness limiting shoulder to 80 degrees, WFL distally General Strength Comments: Grossly 3-/5 LUE Assessment LUE Assessment: Exceptions to [facility] Active Range of Motion (AROM) Comments: Limited shoulder flexion due to cervical precautions however proximal weakness limiting shoulder to 80 degrees, WFL distally General Strength Comments: Grossly 3-/5  Care Tool Care Tool Self Care Eating   Eating Assist Level: Minimal Assistance - Patient > 75%    Oral Care    Oral Care Assist Level: Minimal Assistance - Patient > 75%    Bathing   Body parts bathed by patient: Chest;Abdomen;Front perineal area;Right upper leg;Left upper leg;Face Body parts bathed by helper: Buttocks;Right arm;Left arm;Right lower leg;Left lower  leg   Assist Level: Maximal Assistance - Patient 24 - 49%    Upper Body Dressing(including orthotics)   What is the patient wearing?: Pull over shirt   Assist Level: Maximal Assistance - Patient 25 - 49%    Lower Body Dressing (excluding footwear)   What is the patient wearing?: Underwear/pull up Assist for lower body dressing: Total Assistance - Patient < 25%    Putting on/Taking off footwear   What is the patient wearing?: Non-skid slipper socks Assist for footwear: Dependent - Patient 0%       Care Tool Toileting Toileting activity   Assist for toileting: Total Assistance - Patient < 25%     Care Tool Bed Mobility Roll left and right activity   Roll left and right assist level: Minimal Assistance - Patient > 75%    Sit to lying activity   Sit to lying assist level: Moderate Assistance - Patient 50 - 74%    Lying to sitting on side of bed activity   Lying to sitting on side of bed assist level: the ability to move from lying on the back to sitting on the side of the bed with no back support.: Moderate Assistance - Patient 50 - 74%     Care Tool Transfers Sit to stand transfer   Sit to stand assist level: Minimal Assistance - Patient > 75%    Chair/bed transfer         Toilet transfer   Assist Level: Moderate Assistance - Patient 50 - 74%     Care Tool Cognition  Expression of Ideas and Wants Expression of Ideas and Wants: 4. Without difficulty (complex and basic) - expresses complex messages without difficulty and with speech that is clear and easy to understand  Understanding Verbal and Non-Verbal Content Understanding Verbal and Non-Verbal Content: 4. Understands (complex and basic) - clear comprehension without cues or repetitions   Memory/Recall Ability Memory/Recall Ability : Current season;That he or she is in a hospital/hospital unit;Staff names and faces   Refer to Care Plan for Long Term Goals  SHORT TERM GOAL WEEK 1 OT Short Term Goal 1 (Week 1): Pt  will complete LB dressing using AE PRN with min A OT Short Term Goal 2 (Week 1):  Pt will report a pain level of 5/10 or less for 2 consecutive sessions OT Short Term Goal 3 (Week 1): Pt will complete toilet transfer with CGA using LRAD  Recommendations for other services: Neuropsych and Therapeutic Recreation  Pet therapy and Stress management   Skilled Therapeutic Intervention Patient received upright in bed upon therapy arrival and agreeable to participate in OT evaluation. Education provided on OT purpose, therapy schedule, goals for therapy, and safety policy while in rehab. Pain reported by pt in R hip, low back and cervical region with nurse aware and in room to administer meds. OT offered distraction, rest breaks, anxiety management techniques and repositioning for pain reduction. Patient demonstrates sitting/standing balance and general strength deficits, increased anxiety with mobility, ROM restrictions in bilateral shoulders, high pain levels and ataxic BLE > BUE during functional tasks and transfers resulting in difficulty completing BADL tasks without increased physical assist. Pt will benefit from skilled OT services to focus on mentioned deficits. See below for ADL and functional transfer performance. Pt remained upright in bed at conclusion of session with bed alarm on and all needs met at end of session.   ADL ADL Eating: Minimal assistance Where Assessed-Eating: Bed level Grooming: Minimal assistance Where Assessed-Grooming: Edge of bed Upper Body Bathing: Minimal assistance (simulated) Where Assessed-Upper Body Bathing: Edge of bed Lower Body Bathing: Moderate assistance (simulated) Where Assessed-Lower Body Bathing: Edge of bed Upper Body Dressing: Maximal assistance Where Assessed-Upper Body Dressing: Edge of bed Lower Body Dressing: Dependent Where Assessed-Lower Body Dressing: Edge of bed Toileting: Dependent Where Assessed-Toileting: Bedside Commode Toilet Transfer:  Moderate assistance Toilet Transfer Method: Stand pivot Toilet Transfer Equipment: Bedside commode Tub/Shower Transfer: Unable to assess Tub/Shower Transfer Method: Unable to assess Social research officer, government: Unable to assess [employer] Transfer Method: Unable to assess Mobility  Bed Mobility Bed Mobility: Left Sidelying to Sit;Sitting - Scoot to Edge of Bed;Sit to Sidelying Right Left Sidelying to Sit: Moderate Assistance - Patient 50-74% Sitting - Scoot to Edge of Bed: Moderate Assistance - Patient 50-74% Sit to Sidelying Right: Minimal Assistance - Patient > 75% Transfers Sit to Stand: Minimal Assistance - Patient > 75%   Discharge Criteria: Patient will be discharged from OT if patient refuses treatment 3 consecutive times without medical reason, if treatment goals not met, if there is a change in medical status, if patient makes no progress towards goals or if patient is discharged from hospital.  The above assessment, treatment plan, treatment alternatives and goals were discussed and mutually agreed upon: by patient  Blase Mess, MS, OTR/L  09/14/2022, 1:41 PM

## 2022-09-14 NOTE — Plan of Care (Signed)
Problem: RH Balance Goal: LTG: Patient will maintain dynamic sitting balance (OT) Description: LTG:  Patient will maintain dynamic sitting balance with assistance during activities of daily living (OT) Flowsheets (Taken 09/14/2022 1343) LTG: Pt will maintain dynamic sitting balance during ADLs with: Supervision/Verbal cueing Goal: LTG Patient will maintain dynamic standing with ADLs (OT) Description: LTG:  Patient will maintain dynamic standing balance with assist during activities of daily living (OT)  Flowsheets (Taken 09/14/2022 1343) LTG: Pt will maintain dynamic standing balance during ADLs with: Supervision/Verbal cueing   Problem: Sit to Stand Goal: LTG:  Patient will perform sit to stand in prep for activites of daily living with assistance level (OT) Description: LTG:  Patient will perform sit to stand in prep for activites of daily living with assistance level (OT) Flowsheets (Taken 09/14/2022 1343) LTG: PT will perform sit to stand in prep for activites of daily living with assistance level: Supervision/Verbal cueing   Problem: RH Eating Goal: LTG Patient will perform eating w/assist, cues/equip (OT) Description: LTG: Patient will perform eating with assist, with/without cues using equipment (OT) Flowsheets (Taken 09/14/2022 1343) LTG: Pt will perform eating with assistance level of: Independent with assistive device    Problem: RH Grooming Goal: LTG Patient will perform grooming w/assist,cues/equip (OT) Description: LTG: Patient will perform grooming with assist, with/without cues using equipment (OT) Flowsheets (Taken 09/14/2022 1343) LTG: Pt will perform grooming with assistance level of: Independent with assistive device    Problem: RH Bathing Goal: LTG Patient will bathe all body parts with assist levels (OT) Description: LTG: Patient will bathe all body parts with assist levels (OT) Flowsheets (Taken 09/14/2022 1343) LTG: Pt will perform bathing with assistance  level/cueing: Minimal Assistance - Patient > 75%   Problem: RH Dressing Goal: LTG Patient will perform upper body dressing (OT) Description: LTG Patient will perform upper body dressing with assist, with/without cues (OT). Flowsheets (Taken 09/14/2022 1343) LTG: Pt will perform upper body dressing with assistance level of: Supervision/Verbal cueing Goal: LTG Patient will perform lower body dressing w/assist (OT) Description: LTG: Patient will perform lower body dressing with assist, with/without cues in positioning using equipment (OT) Flowsheets (Taken 09/14/2022 1343) LTG: Pt will perform lower body dressing with assistance level of: Supervision/Verbal cueing   Problem: RH Toileting Goal: LTG Patient will perform toileting task (3/3 steps) with assistance level (OT) Description: LTG: Patient will perform toileting task (3/3 steps) with assistance level (OT)  Flowsheets (Taken 09/14/2022 1343) LTG: Pt will perform toileting task (3/3 steps) with assistance level: Supervision/Verbal cueing   Problem: RH Functional Use of Upper Extremity Goal: LTG Patient will use RT/LT upper extremity as a (OT) Description: LTG: Patient will use right/left upper extremity as a stabilizer/gross assist/diminished/nondominant/dominant level with assist, with/without cues during functional activity (OT) Flowsheets (Taken 09/14/2022 1343) LTG: Use of upper extremity in functional activities: LUE as nondominant level   Problem: RH Simple Meal Prep Goal: LTG Patient will perform simple meal prep w/assist (OT) Description: LTG: Patient will perform simple meal prep with assistance, with/without cues (OT). Flowsheets (Taken 09/14/2022 1343) LTG: Pt will perform simple meal prep with assistance level of: Independent with assistive device LTG: Pt will perform simple meal prep w/level of: Wheelchair level   Problem: RH Toilet Transfers Goal: LTG Patient will perform toilet transfers w/assist (OT) Description: LTG:  Patient will perform toilet transfers with assist, with/without cues using equipment (OT) Flowsheets (Taken 09/14/2022 1343) LTG: Pt will perform toilet transfers with assistance level of: Supervision/Verbal cueing   Problem: RH Tub/Shower  Transfers Goal: LTG Patient will perform tub/shower transfers w/assist (OT) Description: LTG: Patient will perform tub/shower transfers with assist, with/without cues using equipment (OT) Flowsheets (Taken 09/14/2022 1343) LTG: Pt will perform tub/shower stall transfers with assistance level of: Contact Guard/Touching assist

## 2022-09-14 NOTE — Progress Notes (Signed)
Inpatient Rehabilitation Care Coordinator Assessment and Plan Patient Details  Name: Martha [name] MRN: [id number] Date of Birth: 08/08/1961  Today's Date: 09/14/2022  Hospital Problems: Principal Problem:   Acute incomplete quadriplegia [facility]) Active Problems:   Cervical myelopathy ([place])  Past Medical History:  Past Medical History:  Diagnosis Date   Anxiety    Cancer ([place]) 2022   right breast LCIS   Cancer ([place]) 2019   left breast   Depression    Dysrhythmia    SVT, s/p ablation ~ 2012 in at [facility]   Ectopic pregnancy    Family history of breast cancer    Family history of melanoma    History of radiation therapy 04/22/18-06/03/18   Left Breast, left SCV, axilla 50 Gy in 25 fractions, Left breast boost 10 Gy in 5 fractions.    Hypothyroidism    Personal history of radiation therapy    PONV (postoperative nausea and vomiting)    Thyroid disease    Past Surgical History:  Past Surgical History:  Procedure Laterality Date   APPENDECTOMY     BILATERAL SALPINGECTOMY     BREAST BIOPSY Right 2014   fibroadenoma   BREAST BIOPSY Left 01/16/2018   BREAST BIOPSY Right 02/02/2020   LCIS   BREAST BIOPSY Right 08/04/2020   x2 LCIS   BREAST BIOPSY Right 08/13/2020   x2   BREAST EXCISIONAL BIOPSY Left 1990   benign   BREAST EXCISIONAL BIOPSY Right 02/02/2020   LCIS   BREAST LUMPECTOMY Left 01/22/2018   BREAST LUMPECTOMY WITH AXILLARY LYMPH NODE BIOPSY Left 02/21/2018   Procedure: LEFT BREAST LUMPECTOMY WITH AXILLARY LYMPH NODE BIOPSY;  Surgeon: [name], MD;  Location: [place];  Service: General;  Laterality: Left;   BREAST LUMPECTOMY WITH RADIOACTIVE SEED LOCALIZATION Right 03/18/2020   Procedure: RIGHT BREAST LUMPECTOMY WITH RADIOACTIVE SEED LOCALIZATION;  Surgeon: [name], MD;  Location: [place];  Service: General;  Laterality: Right;   BREAST SURGERY Left 1993   cyst removed   ENDOMETRIAL ABLATION     EYE SURGERY     GLAUCOMA SURGERY  Bilateral    POSTERIOR CERVICAL FUSION/FORAMINOTOMY N/A 08/17/2022   Procedure: Cervical One-Cervical Four POSTERIOR CERVICAL FUSION,  Cervical One LAMINECTOMY REDUCTION OF Cervical Two Fracture, Biopsy of Right Cerical Two Pars  Lesion;  Surgeon: Vallarie Mare, MD;  Location: [place];  Service: Neurosurgery;  Laterality: N/A;   POSTERIOR CERVICAL FUSION/FORAMINOTOMY N/A 09/06/2022   Procedure: Posterior Cervical Fusion, Foraminotomy , Cervical Five-Six, Cervical Six-Seven; Extension of  fusion Cervical Four- Thoracic One;  Surgeon: Vallarie Mare, MD;  Location: [place];  Service: Neurosurgery;  Laterality: N/A;   RADIOACTIVE SEED GUIDED EXCISIONAL BREAST BIOPSY Right 04/28/2021   Procedure: RADIOACTIVE SEED GUIDED EXCISIONAL RIGHT BREAST BIOPSY X2;  Surgeon: [name], MD;  Location: [place];  Service: General;  Laterality: Right;   RE-EXCISION OF BREAST LUMPECTOMY Left 03/20/2018   Procedure: RE-EXCISION OF BREAST LUMPECTOMY;  Surgeon: [name], MD;  Location: [place];  Service: General;  Laterality: Left;   Social History:  reports that she has been smoking cigarettes. She has a 20.00 pack-year smoking history. She has never used smokeless tobacco. She reports that she does not currently use alcohol after a past usage of about 28.0 standard drinks of alcohol per week. She reports that she does not use drugs.  Family / Support Systems Marital Status: Married How Long?: 6 years (anniversary Feb 3) Patient Roles: Spouse, Parent Spouse/Significant  Other: Martha [name] (husband) [phone] Children: Step dtr whom is an adult. Reports she is very close to her. Other Supports: family and friends Anticipated Caregiver: Pt husband, family and friends Ability/Limitations of Caregiver: Pt husband works full-time, however, pt states she plans on working out a schedule with family and friends to assist her once she is aware of her discharge. Caregiver  Availability: 24/7 Family Dynamics: Pt lives with her husband.  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 Education: 2 yrs college Health Literacy - How often do you need to have someone help you when you read instructions, pamphlets, or other written material from your doctor or pharmacy?: Never Writes: Yes Employment Status: Unemployed Date Retired/Disabled/Unemployed: Pt reports she quit her job to help care for her mother last year in February. Legal History/Current Legal Issues: Denies Guardian/Conservator: HCPOA- Martha [name] ( husband)   Abuse/Neglect Abuse/Neglect Assessment Can Be Completed: Yes Physical Abuse: Denies Verbal Abuse: Denies Sexual Abuse: Denies Exploitation of patient/patient's resources: Denies Self-Neglect: Denies  Patient response to: Social Isolation - How often do you feel lonely or isolated from those around you?: Never  Emotional Status Pt's affect, behavior and adjustment status: Pt in good spirits at time of visit Recent Psychosocial Issues: Denies Psychiatric History: Denies Substance Abuse History: Pt reports she quit smoking cigarettes at time of admission and has no desire to smoke again. Also reports she was drinking 4 beers daily prior to admission and has quit at time of admission. Denies rec drug use.  Patient / Family Perceptions, Expectations & Goals Pt/Family understanding of illness & functional limitations: Pt has general understanding of care her needs Premorbid pt/family roles/activities: Independent Anticipated changes in roles/activities/participation: Assistance with ADLs/IADLs Pt/family expectations/goals: Pt goal is to "work on knees since they are wobbly; learn how to go to bathroom, get stronger, and coordination."  [employer]: None Premorbid Home Care/DME Agencies:  None Transportation available at discharge: TBD Is the patient able to respond to transportation needs?: Yes In the past 12 months, has lack of transportation kept you from medical appointments or from getting medications?: No In the past 12 months, has lack of transportation kept you from meetings, work, or from getting things needed for daily living?: No Resource referrals recommended: Neuropsychology  Discharge Planning Living Arrangements: Spouse/significant other Support Systems: Spouse/significant other, Other relatives, Friends/neighbors Type of Residence: Private residence Insurance Resources: Multimedia programmer (specify) Nurse, mental health) Financial Resources: Family Support Financial Screen Referred: No Living Expenses: Mortgage Money Management: Patient Does the patient have any problems obtaining your medications?: No Home Management: Ptr reports she prepares meals and household duties. States husban also cooks and manages outdoor duties. Patient/Family Preliminary Plans: TBD Care Coordinator Anticipated Follow Up Needs: HH/OP  Clinical Impression SW met with pt in room at bedside to introduce self, explain role, and discuss discharge process. Pt is not a English as a second language teacher. Pt states her husband is a English as a second language teacher. She has been encouraged to speak with husband about service connection so he can explore if there are any aide resources that may be available to spouses. No DME. She discussed upcoming tx. SW explained will coordinate transportation to appointments. Pt asked about charity for medical bills. SW encouraged her to speak with billing dept to discuss if any options. Pt is aware SW will follow-up with her husband as well.   1220-SW left message for pt husband Martha [name] to introduce self, explain role, and  discuss discharge process. SW informed will continue to follow-up with updates.   Tylerjames Hoglund A Kaliyah Gladman 09/14/2022, 9:30 PM

## 2022-09-14 NOTE — Progress Notes (Signed)
Inpatient Rehabilitation  Patient information reviewed and entered into eRehab system by Kamauri Kathol M. Khady Vandenberg, M.A., CCC/SLP, PPS Coordinator.  Information including medical coding, functional ability and quality indicators will be reviewed and updated through discharge.    

## 2022-09-14 NOTE — Progress Notes (Signed)
Physical Therapy Session Note  Patient Details  Name: Martha [name] MRN: [id number] Date of Birth: 1960/09/01  Today's Date: 09/14/2022 PT Individual Time: 1415-1530 PT Individual Time Calculation (min): 75 min   Short Term Goals: Week 1:  PT Short Term Goal 1 (Week 1): Pt will ambulate x 50 ft with assist and LRAD PT Short Term Goal 2 (Week 1): Pt will perform bed mobility with CGA PT Short Term Goal 3 (Week 1): Pt will sit OOB between therapy sessions for improved activity tolerance.  Skilled Therapeutic Interventions/Progress Updates:  pt received in bed and agreeable to therapy. Pt requested and received pain medication during session for up to 6/10 pain, managed with rest and positioning. Supine>sit with min A. Pt requesting to use bathroom. ambulatory transfer with RW and mod A. 1 episode of knee buckling requiring max A. Tot A for brief change and hygiene. Small incontinent BM and continent B+B void, nsg made aware. Hand hygiene in standing with CGA with pt leaning against sink. Requires min-mod A for balance when without UE support. Pt transported to therapy gym for time management and energy conservation. Pt then performed step ups on 3" steps with min A. Transitioned to 6" steps and performed step ups with mod A. Then navigated x 4 steps with mod A and 2 hand rails. Pt self selects alternating but educated on ascending with stronger leg for safety and expressed understanding. Pt then ambulated x 40 ft and x 60 ft with mod A and w/c follow. X 1 incidence of knee buckling requiring max A. Pt with heavily ataxic gait pattern. Pt then utilized kinetron at 90 cm/sec for several minutes while discussing rehab process, goals, And ELOS,  for posterior chain strength and reciprocal motion integration. Pt returned to room and to bed in the same manner, was left with all needs in reach and alarm active.   Therapy Documentation Precautions:  Precautions Precautions: Cervical,  Fall Precaution Comments: verbally reviewed and pt able to recall with minimal cueing Required Braces or Orthoses: Cervical Brace Cervical Brace: Hard collar Restrictions Weight Bearing Restrictions: No General:       Therapy/Group: Individual Therapy  Mickel Fuchs 09/14/2022, 3:49 PM

## 2022-09-14 NOTE — Progress Notes (Signed)
PROGRESS NOTE   Subjective/Complaints:  Pt reports used urinal to void- per nursing PVRs OK Small BM last night with suppository-  K+ 3.3 Will replete  ROS:  Pt denies SOB, abd pain, CP, N/V/C/D, and vision changes    Objective:   VAS [place] LOWER EXTREMITY VENOUS (DVT)  Result Date: 09/13/2022  Lower Venous DVT Study Patient Name:  [name]  Date of Exam:   09/13/2022 Medical Rec #: [id number]              Accession #:    [id number] Date of Birth: 09/29/1960              Patient Gender: F Patient Age:   62 years Exam Location:  [facility] Procedure:      VAS [place] LOWER EXTREMITY VENOUS (DVT) Referring Phys: PAMELA LOVE --------------------------------------------------------------------------------  Indications: Cervical myelopathy.  Risk Factors: None identified. Comparison Study: No prior studies. Performing Technologist: Oliver Hum RVT  Examination Guidelines: A complete evaluation includes B-mode imaging, spectral Doppler, color Doppler, and power Doppler as needed of all accessible portions of each vessel. Bilateral testing is considered an integral part of a complete examination. Limited examinations for reoccurring indications may be performed as noted. The reflux portion of the exam is performed with the patient in reverse Trendelenburg.  +---------+---------------+---------+-----------+----------+--------------+ RIGHT    CompressibilityPhasicitySpontaneityPropertiesThrombus Aging +---------+---------------+---------+-----------+----------+--------------+ CFV      Full           Yes      Yes                                 +---------+---------------+---------+-----------+----------+--------------+ SFJ      Full                                                        +---------+---------------+---------+-----------+----------+--------------+ FV Prox  Full                                                         +---------+---------------+---------+-----------+----------+--------------+ FV Mid   Full                                                        +---------+---------------+---------+-----------+----------+--------------+ FV DistalFull                                                        +---------+---------------+---------+-----------+----------+--------------+ PFV      Full                                                        +---------+---------------+---------+-----------+----------+--------------+  POP      Full           Yes      Yes                                 +---------+---------------+---------+-----------+----------+--------------+ PTV      Full                                                        +---------+---------------+---------+-----------+----------+--------------+ PERO     Full                                                        +---------+---------------+---------+-----------+----------+--------------+   +---------+---------------+---------+-----------+----------+--------------+ LEFT     CompressibilityPhasicitySpontaneityPropertiesThrombus Aging +---------+---------------+---------+-----------+----------+--------------+ CFV      Full           Yes      Yes                                 +---------+---------------+---------+-----------+----------+--------------+ SFJ      Full                                                        +---------+---------------+---------+-----------+----------+--------------+ FV Prox  Full                                                        +---------+---------------+---------+-----------+----------+--------------+ FV Mid   Full                                                        +---------+---------------+---------+-----------+----------+--------------+ FV DistalFull                                                         +---------+---------------+---------+-----------+----------+--------------+ PFV      Full                                                        +---------+---------------+---------+-----------+----------+--------------+ POP      Full           Yes      Yes                                 +---------+---------------+---------+-----------+----------+--------------+  PTV      Full                                                        +---------+---------------+---------+-----------+----------+--------------+ PERO     Full                                                        +---------+---------------+---------+-----------+----------+--------------+     Summary: RIGHT: - There is no evidence of deep vein thrombosis in the lower extremity.  - No cystic structure found in the popliteal fossa.  LEFT: - There is no evidence of deep vein thrombosis in the lower extremity.  - No cystic structure found in the popliteal fossa.  *See table(s) above for measurements and observations. Electronically signed by Harold Barban MD on 09/13/2022 at 8:33:01 PM.    Final    MR BRAIN W WO CONTRAST  Result Date: 09/13/2022 CLINICAL DATA:  Metastatic disease evaluation; 3T SRS Protocol for radiation treatment planning. EXAM: MRI HEAD WITHOUT AND WITH CONTRAST TECHNIQUE: Multiplanar, multiecho pulse sequences of the brain and surrounding structures were obtained without and with intravenous contrast. CONTRAST:  33m GADAVIST GADOBUTROL 1 MMOL/ML IV SOLN COMPARISON:  MRI of the brain August 25, 2022; MRI of the cervical spine January 9 24. FINDINGS: Brain: A total of 22 enhancing lesions are identified in the current study, annotated on series 1100. New lesions are marked with double arrows. New lesions: Right cerebellar hemisphere, 1 mm, image 122. Left cerebellar hemisphere, 1 mm, image 139 Enlarged lesions: Left cerebellar floccule, 12 mm (10 mm on prior), image 127 Right cerebellar hemisphere, 5 mm (3 mm  on prior), image 122 Right occipital lobe: 4 mm (3 mm on prior), image 146 Left occipital lobe, 3 mm (2 mm on prior), image 159 Left occipital lobe, 7 mm (5 mm on prior), image 161 Left frontal operculum, 2 mm (1 mm on prior) image 172 Left occipital lobe, 2 mm (1 mm on prior), image 173 Right subinsular region, 5 mm (3 mm on prior) image 176 Right posterior temporal, 3 mm) 1 mm on prior), image 177 Left anterior cingulate, 3 mm (2 mm on prior), image 193 Left posterior frontal, 2.5 mm (1.5 mm on prior) image 195 Right parietal, 4 mm (3 mm on prior), image 196 Left parietal, 1.5 mm (less than 1 mm on prior), image 200 Left frontal lobe, 2 mm (less than 1 mm on prior), image 203 Left parietal lobe, 2 mm (less than 1 mm on prior), image 213 Right frontal lobe, 1.5 mm (less than 1 mm on prior), image 222 Unchanged lesions: Right cerebellar hemisphere, 1 mm, image 122 Right temporal lobe, 3 mm, image 145 Left posterior temporal, 3 mm, image 184 Left periventricular, 2 mm, image 192 Mild surrounding edema is seen associated with the the 7 mm medial left occipital lobe lesion. No significant vasogenic edema associated with the other lesions. No acute infarct, hydrocephalus or extra-axial collection. Vascular: Normal flow voids. Skull and upper cervical spine: Diffuse metastatic disease to the cervical spine to the cervical spine and occipital condyles, better evaluated prior MRI of the cervical spine. Multiple enhancing metastatic lesions  are also seen scattered in the calvarium. Sinuses/Orbits: Negative. Other: None. IMPRESSION: Findings consistent with disease progression with mild increase in size of many lesions and development of 2 new lesions, as detailed above. Electronically Signed   By: Pedro Earls M.D.   On: 09/13/2022 14:15   Recent Labs    09/14/22 0622  WBC 9.3  HGB 13.8  HCT 39.0  PLT 265   Recent Labs    09/14/22 0622  NA 131*  K 3.3*  CL 95*  CO2 26  GLUCOSE 107*  BUN 11   CREATININE 0.38*  CALCIUM 9.1    Intake/Output Summary (Last 24 hours) at 09/14/2022 0919 Last data filed at 09/14/2022 0735 Gross per 24 hour  Intake 417 ml  Output 950 ml  Net -533 ml        Physical Exam: Vital Signs Blood pressure (!) 156/83, pulse 100, temperature 98.5 F (36.9 C), resp. rate 18, SpO2 96 %.    General: awake, alert, appropriate, sitting up in bed wearing cervical collar; almost cachetic;  NAD HENT: conjugate gaze; oropharynx moist; cervical collar- incision C/D/I CV: regular to borderline tachycardic rate; no JVD Pulmonary: CTA B/L; no W/R/R- good air movement GI: soft, NT, ND, (+)BS- hypoactive Psychiatric: appropriate, bright affect; but appears to have depression underlying Neurological: Ox3 Skin: Clean and intact without signs of breakdown, posterior cervical spine incision with dry dressing in place Neuro:  Alert and appropriate without dysarthria, speech clear with normal rate.  Bilateral ataxia worse on right.  Altered sensation to light touch primarily in the hands and feet bilaterally.  Cranial nerves II through XII intact, Right upper extremity shoulder abduction 1-2, elbow flexion 4- out of 5, elbow extension 2 out of 5, finger flexion 4-/5 Left upper extremity shoulder abduction 1-2, elbow flexion 4- out of 5, elbow extension 1-2 out of 5, finger flexion 4-/5 Left lower extremity strength 4 out of 5, right lower extremity strength 4- out of 5 Musculoskeletal: No joint swelling or tenderness noted Left wrist PIV     Assessment/Plan: 1. Functional deficits which require 3+ hours per day of interdisciplinary therapy in a comprehensive inpatient rehab setting. Physiatrist is providing close team supervision and 24 hour management of active medical problems listed below. Physiatrist and rehab team continue to assess barriers to discharge/monitor patient progress toward functional and medical goals  Care Tool:  Bathing               Bathing assist       Upper Body Dressing/Undressing Upper body dressing        Upper body assist      Lower Body Dressing/Undressing Lower body dressing            Lower body assist       Toileting Toileting    Toileting assist       Transfers Chair/bed transfer  Transfers assist           Locomotion Ambulation   Ambulation assist              Walk 10 feet activity   Assist           Walk 50 feet activity   Assist           Walk 150 feet activity   Assist           Walk 10 feet on uneven surface  activity   Assist  Wheelchair     Assist               Wheelchair 50 feet with 2 turns activity    Assist            Wheelchair 150 feet activity     Assist          Blood pressure (!) 156/83, pulse 100, temperature 98.5 F (36.9 C), resp. rate 18, SpO2 96 %.  Medical Problem List and Plan: 1. Functional deficits secondary to incomplete nontraumatic quadriplegia due to metastatic breast cancer              -patient may  shower             -ELOS/Goals: 12 to 14 days, PT OT min assist             -Admit to CIR  First day of evaluations- Con't CIR- PT, OT  2.  Antithrombotics: -DVT/anticoagulation:  Pharmaceutical: Lovenox              -antiplatelet therapy: N/A 3. Pain Management:  Low dose oxycodone, tylenol 650 every 4 hours as needed and flexeril prn --On gabapentin 100 mg 3 times daily for  neuropathy.  1/18- pain controlled right now- con't regimen and monitor while doing therapy 4. Mood/Behavior/Sleep: LCSW to follow for evaluation and support.             --trazodone prn for insomnia. Continue ativan prn for anxiety.  Continue Celexa for mood.             -antipsychotic agents: N/A 5. Neuropsych/cognition: This patient is capable of making decisions on her own behalf. 6. Skin/Wound Care: Routine pressure relief measures.  7. Fluids/Electrolytes/Nutrition: Monitor I/O.  Check CMET in am. 8. Breast cancer with mets to brain/spine (liver/lung?): To start XRT 01/24 per chart review             --abnormal LFTs with A [id number]-[id number]  1/18- Brain MRI showed lesions 1-2+ mm worse and 2 new lesions- concerning, but starts radiation 1/24 while in rehab             -Followed by Dr. Truitt Merle oncology 9. Hyponatremia: Mild with sodium at 134.  Recheck lytes in am for follow up 10. Leucocytosis: WBC 13.4 on 01/14--> repeat CBC in am.              --Monitor for fevers and other signs of infection.   1/18- WBC down to 9.3- doing better 11. High blood pressure: SBP 140-150 range. Monitor for trends and with activity. .  --Will order orthostatic vitals as dizziness reported with activity 1/18- BP's still [id number] systolic- don't want to add another medicine right now- will wait to add meds.  12. Neurogenic bowel: Had BM with use of enema and none for 2-3 days.  --Dose of miralax again today followed by suppository after supper.  -Dulcolax suppository daily, MiraLAX as needed, Senokot daily, sorbitol as needed  1/18- small BM with bowel program last night- con't regimen 13. Neurogenic bladder: Did have incontinence at admission that's better but has been using purewick and reports that she's getting up wet.              --monitor voiding with PVR/bladder scans. Check UA. 1/18- U/A negative- is voiding some with urinal- will con't PVRs  14 hypothyroidism.  Continue Synthroid 75 mcg daily.    I spent a total of  39  minutes on total care today- >50% coordination of  care- due to  D/w tam esp PA about her outcomes and MRIs  LOS: 1 days A FACE TO FACE EVALUATION WAS PERFORMED  Suleima Ohlendorf 09/14/2022, 9:19 AM

## 2022-09-14 NOTE — Progress Notes (Signed)
Physical Therapy Assessment and Plan  Patient Details  Name: Martha [name] MRN: [id number] Date of Birth: [birth year]  PT Diagnosis: Abnormality of gait, Ataxia, Ataxic gait, Coordination disorder, Difficulty walking, Impaired sensation, Low back pain, Muscle spasms, Muscle weakness, and Pain in shoulder, back Rehab Potential: Good ELOS: 14-16 days   Today's Date: 09/14/2022 PT Individual Time: 0800-0900 PT Individual Time Calculation (min): 60 min    Hospital Problem: Principal Problem:   Acute incomplete quadriplegia (Martha [name]) Active Problems:   Cervical myelopathy (Martha [name])   Past Medical History:  Past Medical History:  Diagnosis Date   Anxiety    Cancer (Martha [name]) 2022   right breast LCIS   Cancer (Martha [name]) 2019   left breast   Depression    Dysrhythmia    SVT, s/p ablation ~ 2012 in at [facility]   Ectopic pregnancy    Family history of breast cancer    Family history of melanoma    History of radiation therapy 04/22/18-06/03/18   Left Breast, left SCV, axilla 50 Gy in 25 fractions, Left breast boost 10 Gy in 5 fractions.    Hypothyroidism    Personal history of radiation therapy    PONV (postoperative nausea and vomiting)    Thyroid disease    Past Surgical History:  Past Surgical History:  Procedure Laterality Date   APPENDECTOMY     BILATERAL SALPINGECTOMY     BREAST BIOPSY Right 2014   fibroadenoma   BREAST BIOPSY Left 01/16/2018   BREAST BIOPSY Right 02/02/2020   LCIS   BREAST BIOPSY Right 08/04/2020   x2 LCIS   BREAST BIOPSY Right 08/13/2020   x2   BREAST EXCISIONAL BIOPSY Left 1990   benign   BREAST EXCISIONAL BIOPSY Right 02/02/2020   LCIS   BREAST LUMPECTOMY Left 01/22/2018   BREAST LUMPECTOMY WITH AXILLARY LYMPH NODE BIOPSY Left 02/21/2018   Procedure: LEFT BREAST LUMPECTOMY WITH AXILLARY LYMPH NODE BIOPSY;  Surgeon: [name], MD;  Location: [place];  Service: General;  Laterality: Left;   BREAST LUMPECTOMY WITH RADIOACTIVE SEED  LOCALIZATION Right 03/18/2020   Procedure: RIGHT BREAST LUMPECTOMY WITH RADIOACTIVE SEED LOCALIZATION;  Surgeon: [name], MD;  Location: [place];  Service: General;  Laterality: Right;   BREAST SURGERY Left 1993   cyst removed   ENDOMETRIAL ABLATION     EYE SURGERY     GLAUCOMA SURGERY Bilateral    POSTERIOR CERVICAL FUSION/FORAMINOTOMY N/A 08/17/2022   Procedure: Cervical One-Cervical Four POSTERIOR CERVICAL FUSION,  Cervical One LAMINECTOMY REDUCTION OF Cervical Two Fracture, Biopsy of Right Cerical Two Pars  Lesion;  Surgeon: Vallarie Mare, MD;  Location: [place];  Service: Neurosurgery;  Laterality: N/A;   POSTERIOR CERVICAL FUSION/FORAMINOTOMY N/A 09/06/2022   Procedure: Posterior Cervical Fusion, Foraminotomy , Cervical Five-Six, Cervical Six-Seven; Extension of  fusion Cervical Four- Thoracic One;  Surgeon: Vallarie Mare, MD;  Location: [place];  Service: Neurosurgery;  Laterality: N/A;   RADIOACTIVE SEED GUIDED EXCISIONAL BREAST BIOPSY Right 04/28/2021   Procedure: RADIOACTIVE SEED GUIDED EXCISIONAL RIGHT BREAST BIOPSY X2;  Surgeon: [name], MD;  Location: [place];  Service: General;  Laterality: Right;   RE-EXCISION OF BREAST LUMPECTOMY Left 03/20/2018   Procedure: RE-EXCISION OF BREAST LUMPECTOMY;  Surgeon: [name], MD;  Location: [place];  Service: General;  Laterality: Left;    Assessment & Plan Clinical Impression:Martha [name] is a 62 year old female with history of left breast CA 01/2018  s/p lumpectomy & XRT,  right  breast cancer 04/2021, neck pain w/torticollis treated with PT since 10/02 and follow up MRI C spine showed extensive metastatic disease with pathological Fx at base of dens and C2 right lateral mass with extraosseous tumor at C1 and C2 and degenerative changes C4-C7. She was placed in collar and referred to Dr. Collene Gobble.  PET scan done revealing large volume osseous mets with suspicion of hepatic mets as well  as brain mets on MRI.  Dr. Burr Medico  recommended bone biopsy to decide on cancer tx and  goals of care  She underwent C1-C4 fusion with reduction of C2 fracture and bone biopsy on 08/17/22 by Dr. Marcello Moores and was discharged to home 12/23 with plans to start XRT in 1-2 weeks.     Post op seen in ED for BUE pain with spasms treated with steroids and gabapentin. She was admitted on 09/04/22 with increased numbness and weakness in hands progressing to inability to walk as well as decreased sensation from waist down. MRI C/T/L spine done revealing stable fusion but multiple mild to  moderate pathological Fx T2,T5, T6, T10 and T11 as well as mildly progressive spinal stenosis C5 and C6 with cord flattening as well as possible lung mets. Case discussed with Dr. Lanell Persons and Dr. Burr Medico and patient elected on aggressive tx. She underwent laminectomy C4-C7 with extension of previous instrumentation and arthrodesis on 09/06/22.  JP drain removed and she was cleared to start Lovenox for DVT prophylaxis. She has had improvement in sensation BUE as well as strength. Issues with hallucinations felt to be narcotic related and oxycodone dose decreased.  She reports her sensation continues to be altered in her bilateral hands and feet.  She says that she would prefer to use Tylenol primarily to control her pain.  She says she was continent of bowel and bladder prior to her most recent hospital admission.  She does report issues with bladder urgency.  She has been using pure wick at the hospital.  MRI brain done today. PT/OT has been working with patient who continues to be limited by ataxia with sensory deficits, pain, fatigue, posterior lean, dizziness as well as decrease in control of BLE. CIR was recommended due to functional decline.    Patient transferred to CIR on 09/13/2022 .   Patient currently requires mod with mobility secondary to muscle weakness, impaired timing and sequencing, abnormal tone, ataxia, and decreased  coordination, and decreased sitting balance, decreased standing balance, decreased balance strategies, and difficulty maintaining precautions.  Prior to hospitalization, patient was independent  with mobility and lived with Spouse in a House home.  Home access is 2Stairs to enter.  Patient will benefit from skilled PT intervention to maximize safe functional mobility, minimize fall risk, and decrease caregiver burden for planned discharge home with intermittent assist.  Anticipate patient will benefit from follow up OP at discharge.  PT - End of Session Activity Tolerance: Tolerates 30+ min activity with multiple rests Endurance Deficit: Yes Endurance Deficit Description: Required rest breaks and increased time during session PT Assessment Rehab Potential (ACUTE/IP ONLY): Good PT Barriers to Discharge: Decreased caregiver support;Neurogenic Bowel & Bladder;Incontinence;Pending chemo/radiation PT Patient demonstrates impairments in the following area(s): Balance;Skin Integrity;Pain;Motor;Sensory;Safety PT Transfers Functional Problem(s): Bed Mobility;Bed to Chair;Car PT Locomotion Functional Problem(s): Ambulation;Stairs PT Plan PT Intensity: Minimum of 1-2 x/day ,45 to 90 minutes PT Frequency: 5 out of 7 days PT Duration Estimated Length of Stay: 14-16 days PT Treatment/Interventions: Ambulation/gait training;Functional mobility training;DME/adaptive equipment instruction;Pain management;Discharge planning;Psychosocial support;Splinting/orthotics;Therapeutic Activities;UE/LE Strength  taining/ROM;Community reintegration;Balance/vestibular training;Disease management/prevention;Neuromuscular re-education;Functional electrical stimulation;Patient/family education;Skin care/wound Landscape architect;Therapeutic Exercise;UE/LE Coordination activities;Wheelchair propulsion/positioning PT Transfers Anticipated Outcome(s): supervision with LRAD PT Locomotion Anticipated Outcome(s): supervision  household gait with LRAD PT Recommendation Recommendations for Other Services: Neuropsych consult;Therapeutic Recreation consult Therapeutic Recreation Interventions: Pet therapy;Stress management Follow Up Recommendations: Outpatient PT Patient destination: Home Equipment Recommended: Other (comment)   PT Evaluation Precautions/Restrictions Precautions Precautions: Cervical;Fall Precaution Comments: verbally reviewed and pt able to recall with minimal cueing Required Braces or Orthoses: Cervical Brace Cervical Brace: Hard collar Restrictions Weight Bearing Restrictions: No General   Vital SignsTherapy Vitals Temp: 98.5 F (36.9 C) Temp Source: Oral Pulse Rate: 95 Resp: 16 BP: (!) 148/76 Patient Position (if appropriate): Lying Oxygen Therapy SpO2: 96 % O2 Device: Room Air Pain Pain Assessment Pain Scale: 0-10 Pain Score: 6  Pain Type: Acute pain Pain Location: Arm Pain Orientation: Left Pain Descriptors / Indicators: Sharp Pain Frequency: Intermittent Pain Onset: With Activity Pain Intervention(s): Medication (See eMAR) Multiple Pain Sites: Yes 2nd Pain Site Pain Score: 6 Pain Type: Acute pain Pain Location: Rib cage Pain Orientation: Right;Posterior Pain Descriptors / Indicators: Sharp Pain Frequency: Intermittent Pain Onset: With Activity Pain Intervention(s): Medication (See eMAR) Pain Interference Pain Interference Pain Effect on Sleep: 1. Rarely or not at all Pain Interference with Therapy Activities: 1. Rarely or not at all Pain Interference with Day-to-Day Activities: 1. Rarely or not at all Home Living/Prior [place] Available Help at Discharge: Family;Friend(s);Available PRN/intermittently (Husband works but has flexible job hours, and lots of family and friends available to assist) Type of Home: House Home Access: Stairs to enter Technical brewer of Steps: 2 Entrance Stairs-Rails: Can reach both Home Layout: One  level Alternate Level Stairs-Number of Steps: 1 step down into living room, with grab bar Additional Comments: Has RW, rollator, WC from her mother, shower chair and adjustable bed (HOB and foot of bed but not height wise)  Lives With: Spouse Prior Function Level of Independence: Independent with transfers;Independent with gait Vision/Perception  Vision - History Ability to See in Adequate Light: 0 Adequate Perception Perception: Within Functional Limits Praxis Praxis: Intact  Cognition Overall Cognitive Status: Within Functional Limits for tasks assessed Arousal/Alertness: Awake/alert Orientation Level: Oriented X4 Year: 2024 Month: January Day of Week: Correct Memory: Appears intact Awareness: Appears intact Problem Solving: Appears intact Safety/Judgment: Appears intact Sensation Sensation Light Touch: Appears Intact Hot/Cold: Appears Intact Proprioception: Appears Intact Additional Comments: Intact sensation BUE/BLE with assessment however reports numbness/tingling with digits on both Coordination Gross Motor Movements are Fluid and Coordinated: No Fine Motor Movements are Fluid and Coordinated: No Coordination and Movement Description: ataxic and decreased balance strategies Motor  Motor Motor: Ataxia Motor - Skilled Clinical Observations: Pt with ataxia LE>UE   Trunk/Postural Assessment  Cervical Assessment Cervical Assessment: Exceptions to WFL (c collar and cervical precautions) Thoracic Assessment Thoracic Assessment: Within Functional Limits Lumbar Assessment Lumbar Assessment: Within Functional Limits Postural Control Postural Control: Deficits on evaluation  Balance Balance Balance Assessed: Yes Static Sitting Balance Static Sitting - Balance Support: Feet supported;No upper extremity supported Static Sitting - Level of Assistance: 4: Min assist Dynamic Sitting Balance Dynamic Sitting - Balance Support: Feet supported;No upper extremity  supported Dynamic Sitting - Level of Assistance: 4: Min Insurance risk surveyor Standing - Balance Support: During functional activity;Bilateral upper extremity supported Static Standing - Level of Assistance: 5: Stand by assistance (CGA) Dynamic Standing Balance Dynamic Standing - Balance Support: During functional activity;No upper extremity supported;Bilateral upper extremity supported  Dynamic Standing - Level of Assistance: 4: Min assist Extremity Assessment      RLE Assessment RLE Assessment: Exceptions to [facility] General Strength Comments: Grossly 4-/5 LLE Assessment LLE Assessment: Exceptions to [facility] General Strength Comments: Grossly 4/5  Care Tool Care Tool Bed Mobility Roll left and right activity   Roll left and right assist level: Minimal Assistance - Patient > 75%    Sit to lying activity   Sit to lying assist level: Contact Guard/Touching assist    Lying to sitting on side of bed activity   Lying to sitting on side of bed assist level: the ability to move from lying on the back to sitting on the side of the bed with no back support.: Minimal Assistance - Patient > 75%     Care Tool Transfers Sit to stand transfer   Sit to stand assist level: Minimal Assistance - Patient > 75%    Chair/bed transfer   Chair/bed transfer assist level: Moderate Assistance - Patient 50 - 74%     Physiological scientist transfer assist level: Moderate Assistance - Patient 50 - 74%      Care Tool Locomotion Ambulation   Assist level: Moderate Assistance - Patient 50 - 74% Assistive device: Walker-rolling Max distance: 60 ft  Walk 10 feet activity   Assist level: Moderate Assistance - Patient - 50 - 74% Assistive device: Walker-rolling   Walk 50 feet with 2 turns activity   Assist level: Moderate Assistance - Patient - 50 - 74% Assistive device: Walker-rolling  Walk 150 feet activity Walk 150 feet activity did not occur: Safety/medical  concerns      Walk 10 feet on uneven surfaces activity Walk 10 feet on uneven surfaces activity did not occur: Safety/medical concerns      Stairs   Assist level: Moderate Assistance - Patient - 50 - 74% Stairs assistive device: 2 hand rails Max number of stairs: 4  Walk up/down 1 step activity   Walk up/down 1 step (curb) assist level: Moderate Assistance - Patient - 50 - 74% Walk up/down 1 step or curb assistive device: 2 hand rails  Walk up/down 4 steps activity   Walk up/down 4 steps assist level: Moderate Assistance - Patient - 50 - 74% Walk up/down 4 steps assistive device: 2 hand rails  Walk up/down 12 steps activity Walk up/down 12 steps activity did not occur: Safety/medical concerns      Pick up small objects from floor Pick up small object from the floor (from standing position) activity did not occur: Safety/medical concerns      Wheelchair Is the patient using a wheelchair?: Yes Type of Wheelchair: Manual   Wheelchair assist level: Supervision/Verbal cueing Max wheelchair distance: 100 ft  Wheel 50 feet with 2 turns activity   Assist Level: Supervision/Verbal cueing  Wheel 150 feet activity   Assist Level: Minimal Assistance - Patient > 75%    Refer to Care Plan for Long Term Goals  SHORT TERM GOAL WEEK 1 PT Short Term Goal 1 (Week 1): Pt will ambulate x 50 ft with assist and LRAD PT Short Term Goal 2 (Week 1): Pt will perform bed mobility with CGA PT Short Term Goal 3 (Week 1): Pt will sit OOB between therapy sessions for improved activity tolerance.  Recommendations for other services: Neuropsych  Skilled Therapeutic Intervention Evaluation completed (see details above) with patient education regarding purpose of PT evaluation, PT POC and goals,  therapy schedule, weekly team meetings, and other CIR information including safety plan and fall risk safety. MMT and sensory testing performed in sitting. Pt performed the below functional mobility tasks with the  specified levels of skilled cuing and assistance.    Mobility Bed Mobility Bed Mobility: Left Sidelying to Sit;Sitting - Scoot to Edge of Bed;Sit to Sidelying Right Left Sidelying to Sit: Moderate Assistance - Patient 50-74% Sitting - Scoot to Edge of Bed: Moderate Assistance - Patient 50-74% Sit to Sidelying Right: Minimal Assistance - Patient > 75% Transfers Transfers: Sit to [employer] Transfers Sit to Stand: Minimal Assistance - Patient > 75% Stand Pivot Transfers: Moderate Assistance - Patient 50 - 74% Stand Pivot Transfer Details: Verbal cues for sequencing;Verbal cues for technique;Verbal cues for safe use of DME/AE Transfer (Assistive device): Rolling walker Locomotion  Gait Ambulation: Yes Gait Assistance: Moderate Assistance - Patient 50-74% Gait Distance (Feet): 60 Feet Assistive device: Rolling walker Gait Assistance Details: Verbal cues for safe use of DME/AE;Verbal cues for precautions/safety;Verbal cues for gait pattern Gait Gait: Yes Gait Pattern: Impaired Gait Pattern: Ataxic Gait velocity: decreased Stairs / Additional Locomotion Stairs: Yes Stairs Assistance: Moderate Assistance - Patient 50 - 74% Stair Management Technique: Two rails Number of Stairs: 4 Height of Stairs: 6 Wheelchair Mobility Wheelchair Mobility: Yes Wheelchair Assistance: Chartered loss adjuster: Both upper extremities Wheelchair Parts Management: Needs assistance Distance: 100 ft   Discharge Criteria: Patient will be discharged from PT if patient refuses treatment 3 consecutive times without medical reason, if treatment goals not met, if there is a change in medical status, if patient makes no progress towards goals or if patient is discharged from hospital.  The above assessment, treatment plan, treatment alternatives and goals were discussed and mutually agreed upon: by patient  Mickel Fuchs 09/14/2022, 3:44 PM

## 2022-09-14 NOTE — Plan of Care (Addendum)
Met with patient. Oriented to rehab. Had education binder in lab. Discussed surgery. Was still smoking prior to admission. Provided smoking sensation material. Aware that tx starts 01/24. Discussed increasing foods high in protein and importance of Prosource and Juven. Discussed neurogenic bowel and bladder. Reports no issues with bladder and starting on bowel program tonight. Discussed AD and s/s to watch for and to let staff know.  Discussed current medications. All questions answered.  Staff in room for dinner.

## 2022-09-15 ENCOUNTER — Ambulatory Visit: Payer: BC Managed Care – PPO

## 2022-09-15 DIAGNOSIS — G959 Disease of spinal cord, unspecified: Secondary | ICD-10-CM | POA: Diagnosis not present

## 2022-09-15 MED ORDER — SENNOSIDES-DOCUSATE SODIUM 8.6-50 MG PO TABS
1.0000 | ORAL_TABLET | Freq: Two times a day (BID) | ORAL | Status: DC
  Administered 2022-09-15 – 2022-09-27 (×24): 1 via ORAL
  Filled 2022-09-15 (×25): qty 1

## 2022-09-15 NOTE — Progress Notes (Signed)
Patient ID: Martha [name], female   DOB: Jan 08, 1961, 62 y.o.   MRN: [id number]  SW returned call to pr husband [name] [phone]) to answer questions related to grants, and discuss upcoming appointments with scheduled transportation. SW shared will inform him once all appointments are rescheduled. SW explained speaking with billing dept about charity options, if any. SW will also explore any possible grants based on diagnosis and will follow-up.   [id number] spoke with [name] [phone]) to discuss changing all appointments after 1/24. States she will email therapists as all treatment ends at 4pm. Will include SW in email to see what afternoon appointment times are available. SW will continue to coordinate.   Loralee Pacas, MSW, [place] Office: [phone] Cell: [phone] Fax: [phone]

## 2022-09-15 NOTE — Progress Notes (Signed)
Occupational Therapy Session Note  Patient Details  Name: Martha [name] MRN: [id number] Date of Birth: 31-Jul-1961  Today's Date: 09/15/2022 OT Individual Time: 8341-9622 OT Individual Time Calculation (min): 71 min    Short Term Goals: Week 1:  OT Short Term Goal 1 (Week 1): Pt will complete LB dressing using AE PRN with min A OT Short Term Goal 2 (Week 1): Pt will report a pain level of 5/10 or less for 2 consecutive sessions OT Short Term Goal 3 (Week 1): Pt will complete toilet transfer with CGA using LRAD  Skilled Therapeutic Interventions/Progress Updates:  Pt greeted supine in bed, pt agreeable to OT intervention. Session focus on BADL reeducation, BUE [place] tasks, functional mobility, dynamic standing balance and decreasing overall caregiver burden.        Pt completed supine>sit with MIN A using log roll method, pt needed most assist to elevate trunk into sitting. Pt completed ambulatory toilet transfer to bathroom with Rw and MIN - MODA. Pt completed 3/3 toileting tasks with MAX A as pt needed assist for posterior pericare after BM and assist to manage clothes, pt was able to complete anterior pericare with set- up assist. Pt exited bathroom in same manner. Pt sat for hand hygiene with set- up assist.   Total A transport to gym in w/c for time mgmt/ energy conservation.  The Dynamometer Grip Strength Test is a quantitative and objective measure of isometric muscular strength of the hand and forearm.  -Instructions The patient was asked to sit with their back, pelvis, and knees at 90 degrees. The shoulder was adducted and neutrally rotated with The elbow flexed to 90 degrees and forearm in neutral. The arm was not supported.  -Results The score was determined by calculating the average of 3 trials. The pt's average score was  20 lbs in the R hand and  15 lbs in the L hand.   Female Average in lbs 55-59 R 57.3 L 47.3 60-64 R 55.1 L 45.7 65-69 R 49.6 L 41.0 70-74 R  49.6 L 41.5 75+ R 42.6 L 37.6 Pt completed seated [place] task where pt instructed to to doff weighted clothespins from bar with BUEs to facilitate improved grip strength and [place] for ADL participation. Pt completed task with supervision from sitting unable to doff hardest level.  pt began to have increased pain in low back and L shoulder needing to return to room.    Total A transport back to room where pt completed stand pivot back to bed with RW and MINA. Pt needed MIN A to elevate BLEs back to supine.   RN enter to provide pain meds and remainder of session conducted from bed level, issued pt compliant cube to work on functional grasp/release. Also issued pt small compliant cubes to facilitate improved [place]. Pt completed both tasks with supervision. Education provided on strategies to grade task up/down as needed. Education also provided on using wash cloths in room to work on [facility] with pt able to return demo various folding patterns to facilitate improved motor planning and bilateral integration tasks.            Ended session with pt supine in bed with all needs within reach and bed alarm activated.                    Therapy Documentation Precautions:  Precautions Precautions: Cervical, Fall Precaution Comments: verbally reviewed and pt able to recall with minimal cueing Required Braces or Orthoses: Cervical Brace  Cervical Brace: Hard collar Restrictions Weight Bearing Restrictions: No  Pain: Unrated pain reported in low back and L shoulder, utilized heat, repositioning as needed during session.    Therapy/Group: Individual Therapy  Corinne Ports [facility] 09/15/2022, 3:44 PM

## 2022-09-15 NOTE — Progress Notes (Addendum)
Physical Therapy Session Note  Patient Details  Name: Martha [name] MRN: [id number] Date of Birth: [birth year]  Today's Date: 09/15/2022 PT Individual Time: 0807-0905 PT Individual Time Calculation (min): 58 min   Short Term Goals: Week 1:  PT Short Term Goal 1 (Week 1): Pt will ambulate x 50 ft with assist and LRAD PT Short Term Goal 2 (Week 1): Pt will perform bed mobility with CGA PT Short Term Goal 3 (Week 1): Pt will sit OOB between therapy sessions for improved activity tolerance.  Skilled Therapeutic Interventions/Progress Updates:  Patient supine in bed on entrance to room. Patient alert and agreeable to PT session. Hard collar donned. Requests Tylenol prior to beginning session for general/intermittent sharp pain relief at L shoulder.  Therapeutic Activity: Bed Mobility: Donned TED hose with MaxA, then pt able to don socks using Fig 4 technique in bed with supervision. Reaches seated position on L side of bed with vc for technique to perform log roll, then push up to seated position with MinA. Maintains seated position with supervision. Dons shoes with supervision/ MinA to unfold back of shoe.   Lateral scoot and forward scoot on EOB requires time to prep and difficult to perform d/t neuropathy and weakness in hands and arms.   Transfers: Prior to leaving bed, discussed timed toileting schedule with pt and need to head toward bathroom in order to start b/b retraining. Pt does not feel need to toilet but willing to participate. Pt performed sit<>stand with ModA for power up and to stop forward movement d/t trunk weakness. Provided verbal cues for weight shift and technique for push to stand then repositioning hands to RW. D/t pain and fatigue, pt requests w/c transfer to toilet. Toilet transfer performed with use of safety rail and MinA to stand and pivot, then [place] to control descent to toilet. MaxA for clothing mgmt and pericare. .  Gait Training:  Pt ambulated 20 ft using RW  with MinA provided at hips for maintaining BLE stability and prevent knee buckling bilaterally. Demonstrated decreased strength and motor control for consistent placement of stepping pattern and produces widening BOS. Provided vc/ tc for slowing pace, conscious practice/ focus on desired movements, .  Neuromuscular Re-ed: NMR facilitated during session with focus on standing balance and motor control. Pt guided in minisquats with BLE block to prevent buckle. Performs x5 with significant effort. Guided in conscious practice of BLE movements that pt can perform while supine. Guided through heel slides, SLR, quad sets, glute sets, and ankle mobility. Return demos all with good activation and quality of movement. NMR performed for improvements in motor control and coordination, balance, sequencing, judgement, and self confidence/ efficacy in performing all aspects of mobility at highest level of independence.   Patient supine at end of session with brakes locked, bed alarm set, and all needs within reach. Requested clock for pt's room from NS.    Therapy Documentation Precautions:  Precautions Precautions: Cervical, Fall Precaution Comments: verbally reviewed and pt able to recall with minimal cueing Required Braces or Orthoses: Cervical Brace Cervical Brace: Hard collar Restrictions Weight Bearing Restrictions: No General:   Vital Signs:  Pain: Pain Assessment Pain Scale: 0-10 Pain Score: 0-No pain  Minimal intermittent sharp pain noted at L shoulder throughout session. Premedicated.   Therapy/Group: Individual Therapy  Alger Simons PT, DPT, CSRS 09/15/2022, 8:30 AM

## 2022-09-15 NOTE — Care Management (Signed)
[facility] Individual Statement of Services  Patient Name:  Martha [name]  Date:  09/15/2022  Welcome to the [facility].  Our goal is to provide you with an individualized program based on your diagnosis and situation, designed to meet your specific needs.  With this comprehensive rehabilitation program, you will be expected to participate in at least 3 hours of rehabilitation therapies Monday-Friday, with modified therapy programming on the weekends.  Your rehabilitation program will include the following services:  Physical Therapy (PT), Occupational Therapy (OT), Speech Therapy (ST), 24 hour per day rehabilitation nursing, Therapeutic Recreaction (TR), Psychology, Neuropsychology, Care Coordinator, Rehabilitation Medicine, [place], and Other  Weekly team conferences will be held on Tuesdays to discuss your progress.  Your Inpatient Rehabilitation Care Coordinator will talk with you frequently to get your input and to update you on team discussions.  Team conferences with you and your family in attendance may also be held.  Expected length of stay: 14-16 days  Overall anticipated outcome: Supervision to Minimal Assistance  Depending on your progress and recovery, your program may change. Your Inpatient Rehabilitation Care Coordinator will coordinate services and will keep you informed of any changes. Your Inpatient Rehabilitation Care Coordinator's name and contact numbers are listed  below.  The following services may also be recommended but are not provided by the [facility] will be made to provide these services after discharge if needed.  Arrangements include referral to agencies that provide these services.  Your insurance has been verified to be:   [place]  Your primary doctor is:  Dimas Chyle  Pertinent information will be shared with your doctor and your insurance company.  Inpatient Rehabilitation Care Coordinator:  Cathleen Corti [phone] or [phone]  Information discussed with and copy given to patient by: Rana Snare, 09/15/2022, 3:44 PM

## 2022-09-15 NOTE — Progress Notes (Signed)
PROGRESS NOTE   Subjective/Complaints:  Pt reports small BM yesterday- was soft- Is "slumped' in bed/down in bed- cannot eat from this position due to weakness in arms.  Hard to eat/position.   Slept great- didn't take anything to sleep- "dreamed of healing".   Wants IV out. Said it's "been in awhile".    ROS:  Pt denies SOB, abd pain, CP, N/V/C/D, and vision changes    Objective:   VAS [place] LOWER EXTREMITY VENOUS (DVT)  Result Date: 09/13/2022  Lower Venous DVT Study Patient Name:  ERNA BROSSARD [facility]  Date of Exam:   09/13/2022 Medical Rec #: [id number]              Accession #:    [id number] Date of Birth: 30-Apr-1961              Patient Gender: F Patient Age:   62 years Exam Location:  [facility] Procedure:      VAS [place] LOWER EXTREMITY VENOUS (DVT) Referring Phys: PAMELA LOVE --------------------------------------------------------------------------------  Indications: Cervical myelopathy.  Risk Factors: None identified. Comparison Study: No prior studies. Performing Technologist: Oliver Hum RVT  Examination Guidelines: A complete evaluation includes B-mode imaging, spectral Doppler, color Doppler, and power Doppler as needed of all accessible portions of each vessel. Bilateral testing is considered an integral part of a complete examination. Limited examinations for reoccurring indications may be performed as noted. The reflux portion of the exam is performed with the patient in reverse Trendelenburg.  +---------+---------------+---------+-----------+----------+--------------+ RIGHT    CompressibilityPhasicitySpontaneityPropertiesThrombus Aging +---------+---------------+---------+-----------+----------+--------------+ CFV      Full           Yes      Yes                                 +---------+---------------+---------+-----------+----------+--------------+ SFJ      Full                                                         +---------+---------------+---------+-----------+----------+--------------+ FV Prox  Full                                                        +---------+---------------+---------+-----------+----------+--------------+ FV Mid   Full                                                        +---------+---------------+---------+-----------+----------+--------------+ FV DistalFull                                                        +---------+---------------+---------+-----------+----------+--------------+  PFV      Full                                                        +---------+---------------+---------+-----------+----------+--------------+ POP      Full           Yes      Yes                                 +---------+---------------+---------+-----------+----------+--------------+ PTV      Full                                                        +---------+---------------+---------+-----------+----------+--------------+ PERO     Full                                                        +---------+---------------+---------+-----------+----------+--------------+   +---------+---------------+---------+-----------+----------+--------------+ LEFT     CompressibilityPhasicitySpontaneityPropertiesThrombus Aging +---------+---------------+---------+-----------+----------+--------------+ CFV      Full           Yes      Yes                                 +---------+---------------+---------+-----------+----------+--------------+ SFJ      Full                                                        +---------+---------------+---------+-----------+----------+--------------+ FV Prox  Full                                                        +---------+---------------+---------+-----------+----------+--------------+ FV Mid   Full                                                         +---------+---------------+---------+-----------+----------+--------------+ FV DistalFull                                                        +---------+---------------+---------+-----------+----------+--------------+ PFV      Full                                                        +---------+---------------+---------+-----------+----------+--------------+  POP      Full           Yes      Yes                                 +---------+---------------+---------+-----------+----------+--------------+ PTV      Full                                                        +---------+---------------+---------+-----------+----------+--------------+ PERO     Full                                                        +---------+---------------+---------+-----------+----------+--------------+     Summary: RIGHT: - There is no evidence of deep vein thrombosis in the lower extremity.  - No cystic structure found in the popliteal fossa.  LEFT: - There is no evidence of deep vein thrombosis in the lower extremity.  - No cystic structure found in the popliteal fossa.  *See table(s) above for measurements and observations. Electronically signed by Harold Barban MD on 09/13/2022 at 8:33:01 PM.    Final    MR BRAIN W WO CONTRAST  Result Date: 09/13/2022 CLINICAL DATA:  Metastatic disease evaluation; 3T SRS Protocol for radiation treatment planning. EXAM: MRI HEAD WITHOUT AND WITH CONTRAST TECHNIQUE: Multiplanar, multiecho pulse sequences of the brain and surrounding structures were obtained without and with intravenous contrast. CONTRAST:  29m GADAVIST GADOBUTROL 1 MMOL/ML IV SOLN COMPARISON:  MRI of the brain August 25, 2022; MRI of the cervical spine January 9 24. FINDINGS: Brain: A total of 22 enhancing lesions are identified in the current study, annotated on series 1100. New lesions are marked with double arrows. New lesions: Right cerebellar hemisphere, 1 mm, image 122. Left  cerebellar hemisphere, 1 mm, image 139 Enlarged lesions: Left cerebellar floccule, 12 mm (10 mm on prior), image 127 Right cerebellar hemisphere, 5 mm (3 mm on prior), image 122 Right occipital lobe: 4 mm (3 mm on prior), image 146 Left occipital lobe, 3 mm (2 mm on prior), image 159 Left occipital lobe, 7 mm (5 mm on prior), image 161 Left frontal operculum, 2 mm (1 mm on prior) image 172 Left occipital lobe, 2 mm (1 mm on prior), image 173 Right subinsular region, 5 mm (3 mm on prior) image 176 Right posterior temporal, 3 mm) 1 mm on prior), image 177 Left anterior cingulate, 3 mm (2 mm on prior), image 193 Left posterior frontal, 2.5 mm (1.5 mm on prior) image 195 Right parietal, 4 mm (3 mm on prior), image 196 Left parietal, 1.5 mm (less than 1 mm on prior), image 200 Left frontal lobe, 2 mm (less than 1 mm on prior), image 203 Left parietal lobe, 2 mm (less than 1 mm on prior), image 213 Right frontal lobe, 1.5 mm (less than 1 mm on prior), image 222 Unchanged lesions: Right cerebellar hemisphere, 1 mm, image 122 Right temporal lobe, 3 mm, image 145 Left posterior temporal, 3 mm, image 184 Left periventricular, 2 mm, image 192 Mild surrounding edema is seen associated with the the 7  mm medial left occipital lobe lesion. No significant vasogenic edema associated with the other lesions. No acute infarct, hydrocephalus or extra-axial collection. Vascular: Normal flow voids. Skull and upper cervical spine: Diffuse metastatic disease to the cervical spine to the cervical spine and occipital condyles, better evaluated prior MRI of the cervical spine. Multiple enhancing metastatic lesions are also seen scattered in the calvarium. Sinuses/Orbits: Negative. Other: None. IMPRESSION: Findings consistent with disease progression with mild increase in size of many lesions and development of 2 new lesions, as detailed above. Electronically Signed   By: Pedro Earls M.D.   On: 09/13/2022 14:15   Recent Labs     09/14/22 0622  WBC 9.3  HGB 13.8  HCT 39.0  PLT 265   Recent Labs    09/14/22 0622  NA 131*  K 3.3*  CL 95*  CO2 26  GLUCOSE 107*  BUN 11  CREATININE 0.38*  CALCIUM 9.1    Intake/Output Summary (Last 24 hours) at 09/15/2022 0837 Last data filed at 09/15/2022 0700 Gross per 24 hour  Intake 360 ml  Output 550 ml  Net -190 ml        Physical Exam: Vital Signs Blood pressure (!) 151/73, pulse 91, temperature 98 F (36.7 C), resp. rate 16, SpO2 96 %.    General: awake, alert, appropriate,  slumped in bed; NAD HENT: conjugate gaze; oropharynx moist; wearing cervical collar; incision C/D/I CV: regular rate; no JVD Pulmonary: CTA B/L; no W/R/R- good air movement GI: soft, NT, ND, (+)BS- slightly hypoactive Psychiatric: appropriate- almost too bright/ underlying depression? Neurological: Ox3 Skin: Clean and intact without signs of breakdown, posterior cervical spine incision with dry dressing in place Neuro:  Alert and appropriate without dysarthria, speech clear with normal rate.  Bilateral ataxia worse on right.  Altered sensation to light touch primarily in the hands and feet bilaterally.  Cranial nerves II through XII intact, Right upper extremity shoulder abduction 1-2, elbow flexion 4- out of 5, elbow extension 2 out of 5, finger flexion 4-/5 Left upper extremity shoulder abduction 1-2, elbow flexion 4- out of 5, elbow extension 1-2 out of 5, finger flexion 4-/5 Left lower extremity strength 4 out of 5, right lower extremity strength 4- out of 5 Musculoskeletal: No joint swelling or tenderness noted Left wrist PIV     Assessment/Plan: 1. Functional deficits which require 3+ hours per day of interdisciplinary therapy in a comprehensive inpatient rehab setting. Physiatrist is providing close team supervision and 24 hour management of active medical problems listed below. Physiatrist and rehab team continue to assess barriers to discharge/monitor patient progress  toward functional and medical goals  Care Tool:  Bathing    Body parts bathed by patient: Chest, Abdomen, Front perineal area, Right upper leg, Left upper leg, Face   Body parts bathed by helper: Buttocks, Right arm, Left arm, Right lower leg, Left lower leg     Bathing assist Assist Level: Maximal Assistance - Patient 24 - 49%     Upper Body Dressing/Undressing Upper body dressing   What is the patient wearing?: Pull over shirt    Upper body assist Assist Level: Maximal Assistance - Patient 25 - 49%    Lower Body Dressing/Undressing Lower body dressing      What is the patient wearing?: Underwear/pull up     Lower body assist Assist for lower body dressing: Total Assistance - Patient < 25%     Toileting Toileting    Toileting assist Assist for toileting:  Total Assistance - Patient < 25%     Transfers Chair/bed transfer  Transfers assist     Chair/bed transfer assist level: Moderate Assistance - Patient 50 - 74%     Locomotion Ambulation   Ambulation assist      Assist level: Moderate Assistance - Patient 50 - 74% Assistive device: Walker-rolling Max distance: 60 ft   Walk 10 feet activity   Assist     Assist level: Moderate Assistance - Patient - 50 - 74% Assistive device: Walker-rolling   Walk 50 feet activity   Assist    Assist level: Moderate Assistance - Patient - 50 - 74% Assistive device: Walker-rolling    Walk 150 feet activity   Assist Walk 150 feet activity did not occur: Safety/medical concerns         Walk 10 feet on uneven surface  activity   Assist Walk 10 feet on uneven surfaces activity did not occur: Safety/medical concerns         Wheelchair     Assist Is the patient using a wheelchair?: Yes Type of Wheelchair: Manual    Wheelchair assist level: Supervision/Verbal cueing Max wheelchair distance: 100 ft    Wheelchair 50 feet with 2 turns activity    Assist        Assist Level:  Supervision/Verbal cueing   Wheelchair 150 feet activity     Assist      Assist Level: Minimal Assistance - Patient > 75%   Blood pressure (!) 151/73, pulse 91, temperature 98 F (36.7 C), resp. rate 16, SpO2 96 %.  Medical Problem List and Plan: 1. Functional deficits secondary to incomplete nontraumatic quadriplegia due to metastatic breast cancer              -patient may  shower             -ELOS/Goals: 12 to 14 days, PT OT min assist            Con't CIR- PT and OT- doesn't need SLP at this time, however will monitor  2.  Antithrombotics: -DVT/anticoagulation:  Pharmaceutical: Lovenox 1/19- the Dopplers are negative              -antiplatelet therapy: N/A 3. Pain Management:  Low dose oxycodone, tylenol 650 every 4 hours as needed and flexeril prn --On gabapentin 100 mg 3 times daily for  neuropathy.  1/18- pain controlled right now- con't regimen and monitor while doing therapy 4. Mood/Behavior/Sleep: LCSW to follow for evaluation and support.             --trazodone prn for insomnia. Continue ativan prn for anxiety.  Continue Celexa for mood.             -antipsychotic agents: N/A 5. Neuropsych/cognition: This patient is capable of making decisions on her own behalf. 6. Skin/Wound Care: Routine pressure relief measures.  7. Fluids/Electrolytes/Nutrition: Monitor I/O. Check CMET in am. 8. Breast cancer with mets to brain/spine (liver/lung?): To start XRT 01/24 per chart review             --abnormal LFTs with A [id number]-[id number]  1/18- Brain MRI showed lesions 1-2+ mm worse and 2 new lesions- concerning, but starts radiation 1/24 while in rehab  1/19- spoke with SW- will move appointments to afternoon so can participate in therapy first.              -Followed by Dr. Truitt Merle oncology 9. Hyponatremia: Mild with sodium at 134.  Recheck lytes in am  for follow up 10. Leucocytosis: WBC 13.4 on 01/14--> repeat CBC in am.              --Monitor for fevers and other signs of infection.    1/18- WBC down to 9.3- doing better 11. High blood pressure: SBP 140-150 range. Monitor for trends and with activity. .  --Will order orthostatic vitals as dizziness reported with activity 09/07/17- - BP's still 366Q-947M systolic- don't want to add another medicine right now- will wait to add meds.  12. Neurogenic bowel: Had BM with use of enema and none for 2-3 days.  --Dose of miralax again today followed by suppository after supper.  -Dulcolax suppository daily, MiraLAX as needed, Senokot daily, sorbitol as needed  1/18- small BM with bowel program last night- con't regimen 1/19- Small BM last night- since soft, won't increase Colace, but will increase Senna to 1 tab BID 13. Neurogenic bladder: Did have incontinence at admission that's better but has been using purewick and reports that she's getting up wet.              --monitor voiding with PVR/bladder scans. Check UA. 1/18- U/A negative- is voiding some with urinal- will con't PVRs  14 hypothyroidism.  Continue Synthroid 75 mcg daily.    I spent a total of 39   minutes on total care today- >50% coordination of care- due to  D/w SW about Radiation appointments; also nursing about d/c IV; and nursing to get pt pulled up in bed and to discuss bowels and pain.   LOS: 2 days A FACE TO FACE EVALUATION WAS PERFORMED  Doniven Vanpatten 09/15/2022, 8:37 AM

## 2022-09-15 NOTE — Progress Notes (Signed)
Occupational Therapy Session Note  Patient Details  Name: Martha [name] MRN: [id number] Date of Birth: 10/25/1960  Today's Date: 09/15/2022 OT Individual Time: 9937-1696 OT Individual Time Calculation (min): 73 min    Short Term Goals: Week 1:  OT Short Term Goal 1 (Week 1): Pt will complete LB dressing using AE PRN with min A OT Short Term Goal 2 (Week 1): Pt will report a pain level of 5/10 or less for 2 consecutive sessions OT Short Term Goal 3 (Week 1): Pt will complete toilet transfer with CGA using LRAD  Skilled Therapeutic Interventions/Progress Updates:    Patient received supine in bed.  Agreeable and somewhat anxious regarding shower.  Patient requires mod assist to pivot to wheelchair - insufficient forward weight shift with every transition.  Patient declined need to void.  Transported into bathroom, and transferred (stand step with grab bar) to shower bench.  Patient aware that collar can be removed for shower.  Patient showered with max assist able to maintain sitting balance on shower bench - however her upper body was consistently behind her base of support.  Facilitated sit to stand and stand to sit multiple times to encourage more natural mechanics.  Patient dressed at sink with max assist.  Discussed consideration for front opening shirts or larger shirts which she could put over head without excessive shoulder motion.   Patient fatigued at end of session and assisted back to bed.  Bed alarm engaged and call bell, personal items within reach.    Therapy Documentation Precautions:  Precautions Precautions: Cervical, Fall Precaution Comments: verbally reviewed and pt able to recall with minimal cueing Required Braces or Orthoses: Cervical Brace Cervical Brace: Hard collar Restrictions Weight Bearing Restrictions: No  Pain: Pain Assessment Pain Score: 6 - left anterior shoulder and right hip   Therapy/Group: Individual Therapy  Mariah Milling 09/15/2022,  12:48 PM

## 2022-09-16 DIAGNOSIS — G959 Disease of spinal cord, unspecified: Secondary | ICD-10-CM | POA: Diagnosis not present

## 2022-09-16 MED ORDER — GABAPENTIN 100 MG PO CAPS
200.0000 mg | ORAL_CAPSULE | Freq: Three times a day (TID) | ORAL | Status: DC
  Administered 2022-09-16 – 2022-09-27 (×32): 200 mg via ORAL
  Filled 2022-09-16 (×32): qty 2

## 2022-09-16 NOTE — Progress Notes (Signed)
Occupational Therapy Session Note  Patient Details  Name: Martha [name] MRN: [id number] Date of Birth: Mar 27, 1961  Today's Date: 09/16/2022 OT Individual Time: 4920-1007 OT Individual Time Calculation (min): 69 min    Short Term Goals: Week 1:  OT Short Term Goal 1 (Week 1): Pt will complete LB dressing using AE PRN with min A OT Short Term Goal 2 (Week 1): Pt will report a pain level of 5/10 or less for 2 consecutive sessions OT Short Term Goal 3 (Week 1): Pt will complete toilet transfer with CGA using LRAD  Skilled Therapeutic Interventions/Progress Updates:  Pt received resting in bed for skilled OT session with focus on generalized strengthening, short-distance functional mobility, and standing balance. Pt agreeable to interventions, demonstrating overall pleasant mood. Pt with varying levels of pain. OT offering intermediate rest breaks and positioning suggestions throughout session to address pain/fatigue and maximize participation/safety in session.   Pt with increased discomfort/pain in L-shoulder and lower back, initially declining OOB activity. Pt performs 2 sets/10 reps of bicep curls with 1lb DB. Pt attempts further UE movements but demonstrating limited R-shoulder flexion and R elbow extension, unable to perform "punch" exercise. Pt tolerates ~10 reps of "punch" exercise with LUE without weight.   Pt performs bed mobility with Min A to reach EOB sitting, initially complete STS transfer with Mod A + RW, fading to Min A + RW for remainder of STS transfers. Pt completes room-level functional mobility with continued ataxic gait and hyper-extended hips when in static stance.   Pt dependent for WC transport from room<>day room for time management. In day room, pt completes ~10 mins of generalized LB strengthening on Kinetron machine. Pt then participates in corn-hole game for standing balance/tolerance using R-hand. Pt with one instance of LOB upon standing to participate in  corn-hole, requiring CGA+ RW for remainder of game.   Time spent locating and educating patient on weighted/insulated blue mug for increased independence and decreased spillage when drinking due to decreased B-hand strength/coordination.  Pt remained resting in bed with all immediate needs met at end of session. Pt continues to be appropriate for skilled OT interventioo promote further functional independence.   Therapy Documentation Precautions:  Precautions Precautions: Cervical, Fall Precaution Comments: verbally reviewed and pt able to recall with minimal cueing Required Braces or Orthoses: Cervical Brace Cervical Brace: Hard collar Restrictions Weight Bearing Restrictions: No   Therapy/Group: Individual Therapy  Maudie Mercury, OTR/L, MSOT  09/16/2022, 6:37 AM

## 2022-09-16 NOTE — Progress Notes (Signed)
Physical Therapy Session Note  Patient Details  Name: Martha [name] MRN: [id number] Date of Birth: Sep 24, 1960  Today's Date: 09/16/2022 PT Individual Time: 0800-0915 PT Individual Time Calculation (min): 75 min   Short Term Goals: Week 1:  PT Short Term Goal 1 (Week 1): Pt will ambulate x 50 ft with assist and LRAD PT Short Term Goal 2 (Week 1): Pt will perform bed mobility with CGA PT Short Term Goal 3 (Week 1): Pt will sit OOB between therapy sessions for improved activity tolerance.  Skilled Therapeutic Interventions/Progress Updates:    pt received in bed and agreeable to therapy. Pt in no pain at rest, but with intermittent intense pain at L shoulder and back. Pain has variable location throughout pt's back. premedicated. Rest and positioning provided as needed. Also provided pain neuroscience education and pt instructed in deep, slow breathing for parasympathetic response. Pt receptive to education and agreeable to use breathing and meditation techniques learned previously for stress and pain management.   Pt donned ted hose max A for time and then pants and socks with supervision and increased time. Pt limited by hand dexterity but able to problem solve without assistance.   Pt performed Sit to stand with min-mod A during session, limited by poor anterior weight shift consistently. Stand pivot transfer x 4 with RW and min A for balance d/t ataxia.   Attempted various interventions for improved anterior weight shift mechanics, but pt reports increasing pain in back when leaning forward. Pt able to lean forward on to physioball supported on hands, but not able to complete lean onto elbows. With therapist positioned in front, she had continued difficulty, with noted fear and anxiety. Discussed graded exposure to motions for improved ability to participate and modification to activities feel "safer" in future sessions.  Pt ambulated x 80 ft with RW and min A for balance d/t ataxia.  Pt returned to room and to bed with supervision bed mobility. Pt was left with all needs in reach and alarm active.   Therapy Documentation Precautions:  Precautions Precautions: Cervical, Fall Precaution Comments: verbally reviewed and pt able to recall with minimal cueing Required Braces or Orthoses: Cervical Brace Cervical Brace: Hard collar Restrictions Weight Bearing Restrictions: No General:       Therapy/Group: Individual Therapy  Mickel Fuchs 09/16/2022, 12:55 PM

## 2022-09-16 NOTE — IPOC Note (Signed)
Overall Plan of Care [facility]) Patient Details Name: Martha [name] MRN: [id number] DOB: 1961/03/26  Admitting Diagnosis: Acute incomplete quadriplegia [facility])  Hospital Problems: Principal Problem:   Acute incomplete quadriplegia ([place]) Active Problems:   Cervical myelopathy ([place])     Functional Problem List: Nursing Bladder, Bowel, Safety, Sensory, Endurance, Medication Management, Motor, Pain  PT Balance, Skin Integrity, Pain, Motor, Sensory, Safety  OT Balance, Endurance, Nutrition, Pain, Sensory  SLP    TR         Basic ADL's: OT Bathing, Dressing, Toileting, Eating, Grooming     Advanced  ADL's: OT Simple Meal Preparation     Transfers: PT Bed Mobility, Bed to Chair, Car  OT Toilet, Tub/Shower     Locomotion: PT Ambulation, Stairs     Additional Impairments: OT Fuctional Use of Upper Extremity  SLP        TR      Anticipated Outcomes Item Anticipated Outcome  Self Feeding Mod I  Swallowing      Basic self-care  Supervision, Min A  Toileting  Supervision   Bathroom Transfers Supervision  Bowel/Bladder  continent B/B  Transfers  supervision with LRAD  Locomotion  supervision household gait with LRAD  Communication     Cognition     Pain  less than 3  Safety/Judgment  remain fall free while in rehab   Therapy Plan: PT Intensity: Minimum of 1-2 x/day ,45 to 90 minutes PT Frequency: 5 out of 7 days PT Duration Estimated Length of Stay: 14-16 days OT Intensity: Minimum of 1-2 x/day, 45 to 90 minutes OT Frequency: 5 out of 7 days OT Duration/Estimated Length of Stay: 2-2.5 weeks     Team Interventions: Nursing Interventions Patient/Family Education, Pain Management, Bladder Management, Medication Management, Discharge Planning, Bowel Management, Disease Management/Prevention, Cognitive Remediation/Compensation  PT interventions Ambulation/gait training, Functional mobility training, DME/adaptive equipment instruction, Pain management,  Discharge planning, Psychosocial support, Splinting/orthotics, Therapeutic Activities, UE/LE Strength taining/ROM, Community reintegration, Training and development officer, Disease management/prevention, Neuromuscular re-education, Functional electrical stimulation, Patient/family education, Skin care/wound management, Stair training, Therapeutic Exercise, UE/LE Coordination activities, Wheelchair propulsion/positioning  OT Interventions Training and development officer, Discharge planning, Pain management, Self Care/advanced ADL retraining, Therapeutic Activities, UE/LE Coordination activities, Disease mangement/prevention, Functional mobility training, Patient/family education, Therapeutic Exercise, DME/adaptive equipment instruction, Neuromuscular re-education, Psychosocial support, UE/LE Strength taining/ROM, Wheelchair propulsion/positioning  SLP Interventions    TR Interventions    SW/CM Interventions Discharge Planning, Psychosocial Support, Patient/Family Education   Barriers to Discharge MD  Medical stability, Home enviroment access/loayout, Incontinence, Neurogenic bowel and bladder, Wound care, Weight, Weight bearing restrictions, and Pending chemo/radiation  Nursing Inaccessible home environment, Decreased caregiver support, Pending chemo/radiation home with spouse 1 level 2 ste with rails r/l with 1 stp to LR with rail on right  PT Decreased caregiver support, Neurogenic Bowel & Bladder, Incontinence, Pending chemo/radiation    OT Home environment access/layout, Other (comments), Nutrition means, Neurogenic Bowel & Bladder (Cancer)    SLP      SW       Team Discharge Planning: Destination: PT-Home ,OT- Home , SLP-  Projected Follow-up: PT-Outpatient PT, OT-  24 hour supervision/assistance, Home health OT, SLP-  Projected Equipment Needs: PT-Other (comment), OT- 3 in 1 bedside comode, To be determined, SLP-  Equipment Details: PT- , OT-  Patient/family involved in discharge planning: PT-  Patient,  OT-Patient, SLP-   MD ELOS: 2-2.5 weeks Medical Rehab Prognosis:  Good Assessment: The patient has been admitted for CIR therapies with the diagnosis of cervical nontraumatic quadriplegia  due to impingement on [place] from mets. The team will be addressing functional mobility, strength, stamina, balance, safety, adaptive techniques and equipment, self-care, bowel and bladder mgt, patient and caregiver education, bowel program. Goals have been set at mod I to supervision. Anticipated discharge destination is home.        See Team Conference Notes for weekly updates to the plan of care

## 2022-09-16 NOTE — Progress Notes (Signed)
PROGRESS NOTE   Subjective/Complaints:  Pt reports took Ativan this AM due to anxiety- pain and anxiety cycle- cycling downward On Celexa already.   LBM o/n with suppository.  Has had some smears with not knowing if gas or stool.   Thoracic tight. Constricting, like too tight seatbelt/corset mid thoracic- painful.   Bladder- going with urinal- going well.    ROS:  Pt denies SOB, abd pain, CP, N/V/C/D, and vision changes Except for HPI    Objective:   No results found. Recent Labs    09/14/22 0622  WBC 9.3  HGB 13.8  HCT 39.0  PLT 265   Recent Labs    09/14/22 0622  NA 131*  K 3.3*  CL 95*  CO2 26  GLUCOSE 107*  BUN 11  CREATININE 0.38*  CALCIUM 9.1    Intake/Output Summary (Last 24 hours) at 09/16/2022 1435 Last data filed at 09/16/2022 1100 Gross per 24 hour  Intake --  Output 1404 ml  Net -1404 ml        Physical Exam: Vital Signs Blood pressure (!) 157/86, pulse 98, temperature 98.8 F (37.1 C), resp. rate 16, height 5' 2.99" (1.6 m), weight 41.1 kg, SpO2 96 %.     General: awake, alert, appropriate, sitting up in bed; petite woman; NAD HENT: conjugate gaze; oropharynx moist-wearing cervical collar- skin incision as below CV: regular to borderline tachycardic rate; no JVD Pulmonary: CTA B/L; no W/R/R- good air movement GI: soft, NT, ND, (+)BS Psychiatric: appropriate- bright affect Neurological: Ox3  Skin: Clean and intact without signs of breakdown, posterior cervical spine incision with dry dressing in place- some dried blood- on dressing;  no erythema; C/D/I Neuro:  Alert and appropriate without dysarthria, speech clear with normal rate.  Bilateral ataxia worse on right.  Altered sensation to light touch primarily in the hands and feet bilaterally.  Cranial nerves II through XII intact, Right upper extremity shoulder abduction 1-2, elbow flexion 4- out of 5, elbow extension 2 out of  5, finger flexion 4-/5 Left upper extremity shoulder abduction 1-2, elbow flexion 4- out of 5, elbow extension 1-2 out of 5, finger flexion 4-/5 Left lower extremity strength 4 out of 5, right lower extremity strength 4- out of 5 Musculoskeletal: No joint swelling or tenderness noted Left wrist PIV     Assessment/Plan: 1. Functional deficits which require 3+ hours per day of interdisciplinary therapy in a comprehensive inpatient rehab setting. Physiatrist is providing close team supervision and 24 hour management of active medical problems listed below. Physiatrist and rehab team continue to assess barriers to discharge/monitor patient progress toward functional and medical goals  Care Tool:  Bathing    Body parts bathed by patient: Chest, Abdomen, Front perineal area, Right upper leg, Left upper leg, Face   Body parts bathed by helper: Buttocks, Right arm, Left arm, Right lower leg, Left lower leg     Bathing assist Assist Level: Maximal Assistance - Patient 24 - 49%     Upper Body Dressing/Undressing Upper body dressing   What is the patient wearing?: Pull over shirt    Upper body assist Assist Level: Maximal Assistance - Patient 25 - 49%  Lower Body Dressing/Undressing Lower body dressing      What is the patient wearing?: Underwear/pull up     Lower body assist Assist for lower body dressing: Total Assistance - Patient < 25%     Toileting Toileting    Toileting assist Assist for toileting: Maximal Assistance - Patient 25 - 49%     Transfers Chair/bed transfer  Transfers assist     Chair/bed transfer assist level: Moderate Assistance - Patient 50 - 74%     Locomotion Ambulation   Ambulation assist      Assist level: Moderate Assistance - Patient 50 - 74% Assistive device: Walker-rolling Max distance: 60 ft   Walk 10 feet activity   Assist     Assist level: Moderate Assistance - Patient - 50 - 74% Assistive device: Walker-rolling   Walk  50 feet activity   Assist    Assist level: Moderate Assistance - Patient - 50 - 74% Assistive device: Walker-rolling    Walk 150 feet activity   Assist Walk 150 feet activity did not occur: Safety/medical concerns         Walk 10 feet on uneven surface  activity   Assist Walk 10 feet on uneven surfaces activity did not occur: Safety/medical concerns         Wheelchair     Assist Is the patient using a wheelchair?: Yes Type of Wheelchair: Manual    Wheelchair assist level: Supervision/Verbal cueing Max wheelchair distance: 100 ft    Wheelchair 50 feet with 2 turns activity    Assist        Assist Level: Supervision/Verbal cueing   Wheelchair 150 feet activity     Assist      Assist Level: Minimal Assistance - Patient > 75%   Blood pressure (!) 157/86, pulse 98, temperature 98.8 F (37.1 C), resp. rate 16, height 5' 2.99" (1.6 m), weight 41.1 kg, SpO2 96 %.  Medical Problem List and Plan: 1. Functional deficits secondary to incomplete nontraumatic quadriplegia due to metastatic breast cancer              -patient may  shower             -ELOS/Goals: 12 to 14 days, PT OT min assist           Con't CIR- PT and OT- in cervical collar 2.  Antithrombotics: -DVT/anticoagulation:  Pharmaceutical: Lovenox 1/19- the Dopplers are negative              -antiplatelet therapy: N/A 3. Pain Management:  Low dose oxycodone, tylenol 650 every 4 hours as needed and flexeril prn --On gabapentin 100 mg 3 times daily for  neuropathy.  1/18- pain controlled right now- con't regimen and monitor while doing therapy 1/20- al level SCI pain- increased gabapentin to 200 mg TID 4. Mood/Behavior/Sleep: LCSW to follow for evaluation and support.             --trazodone prn for insomnia. Continue ativan prn for anxiety.  Continue Celexa for mood.             -antipsychotic agents: N/A 5. Neuropsych/cognition: This patient is capable of making decisions on her own  behalf. 6. Skin/Wound Care: Routine pressure relief measures.  7. Fluids/Electrolytes/Nutrition: Monitor I/O. Check CMET in am. 8. Breast cancer with mets to brain/spine (liver/lung?): To start XRT 01/24 per chart review             --abnormal LFTs with A phos-[id number]  1/18- Brain MRI showed  lesions 1-2+ mm worse and 2 new lesions- concerning, but starts radiation 1/24 while in rehab  1/19- spoke with SW- will move appointments to afternoon so can participate in therapy first.   1/20- d/w pt             -Followed by Dr. Truitt Merle oncology 9. Hyponatremia: Mild with sodium at 134.  Recheck lytes in am for follow up 10. Leucocytosis: WBC 13.4 on 01/14--> repeat CBC in am.              --Monitor for fevers and other signs of infection.   1/18- WBC down to 9.3- doing better 11. High blood pressure: SBP 140-150 range. Monitor for trends and with activity. .  --Will order orthostatic vitals as dizziness reported with activity 09/07/17- - BP's still 383A-919T systolic- don't want to add another medicine right now- will wait to add meds.   1/20- don't want to add more meds, esp because SCI pts have a tendency to orthostatic hypotension 12. Neurogenic bowel- on bowel program: Had BM with use of enema and none for 2-3 days.  --Dose of miralax again today followed by suppository after supper.  -Dulcolax suppository daily, MiraLAX as needed, Senokot daily, sorbitol as needed  1/18- small BM with bowel program last night- con't regimen 1/19- Small BM last night- since soft, won't increase Colace, but will increase Senna to 1 tab BID 1/20- LBM overnight with suppository 13. Neurogenic bladder: Did have incontinence at admission that's better but has been using purewick and reports that she's getting up wet.              --monitor voiding with PVR/bladder scans. Check UA. 1/18- U/A negative- is voiding some with urinal- will con't PVRs   1/20- voiding with urinal- hasn't needed cathing 14 hypothyroidism.   Continue Synthroid 75 mcg daily. 15. At level SCI pain- is actually in thoracic area, but is textbook- will increase gabapentin to 200 mg TID and monitor pain issues- explained will never go away, but can get better     I spent a total of 41    minutes on total care today- >50% coordination of care- due to d/w PT about at level SCI pain; pt about same; and IPOC.    LOS: 3 days A FACE TO FACE EVALUATION WAS PERFORMED  Dayvin Aber 09/16/2022, 2:35 PM

## 2022-09-16 NOTE — Progress Notes (Signed)
Occupational Therapy Session Note  Patient Details  Name: Martha [name] MRN: [id number] Date of Birth: [birth year]  Today's Date: 09/16/2022 OT Individual Time: 1030-1051 OT Individual Time Calculation (min): 21 min  and Today's Date: 09/16/2022 OT Missed Time: 24 Minutes Missed Time Reason: Patient fatigue   Short Term Goals: Week 1:  OT Short Term Goal 1 (Week 1): Pt will complete LB dressing using AE PRN with min A OT Short Term Goal 2 (Week 1): Pt will report a pain level of 5/10 or less for 2 consecutive sessions OT Short Term Goal 3 (Week 1): Pt will complete toilet transfer with CGA using LRAD  Skilled Therapeutic Interventions/Progress Updates:    Pt received supine in bed reporting new pain in L shoulder and R hip, however verbalized speaking to MD and informed of healing process. Pt hesitant to transfer to w/c due to fatigue, however agreeable to sit EOB and change shirt. Completed supine <>sit with min A and required max A to change shirt. Pt initially agreeable to FM activities sitting EOB, however quickly fatigued with dressing and asked to rest. Pt left supine in bed with HOB elevated and all needs in reach.   Therapy Documentation Precautions:  Precautions Precautions: Cervical, Fall Precaution Comments: verbally reviewed and pt able to recall with minimal cueing Required Braces or Orthoses: Cervical Brace Cervical Brace: Hard collar Restrictions Weight Bearing Restrictions: No General: General OT Amount of Missed Time: 24 Minutes Vital Signs:   Pain: Pain Assessment Pain Scale: 0-10 Pain Score: 6  Pain Type: Acute pain Pain Location: Shoulder Pain Orientation: Left Pain Frequency: Intermittent ADL: ADL Eating: Minimal assistance Where Assessed-Eating: Bed level Grooming: Minimal assistance Where Assessed-Grooming: Edge of bed Upper Body Bathing: Minimal assistance (simulated) Where Assessed-Upper Body Bathing: Edge of bed Lower Body Bathing:  Moderate assistance (simulated) Where Assessed-Lower Body Bathing: Edge of bed Upper Body Dressing: Maximal assistance Where Assessed-Upper Body Dressing: Edge of bed Lower Body Dressing: Dependent Where Assessed-Lower Body Dressing: Edge of bed Toileting: Dependent Where Assessed-Toileting: Bedside Commode Toilet Transfer: Moderate assistance Toilet Transfer Method: Stand pivot Science writer: Bedside commode Tub/Shower Transfer: Unable to assess Tub/Shower Transfer Method: Unable to assess [employer] Transfer: Unable to assess [employer] Transfer Method: Unable to assess Vision   Perception    Praxis   Balance   Exercises:   Other Treatments:     Therapy/Group: Individual Therapy  Duayne Cal 09/16/2022, 10:56 AM

## 2022-09-17 DIAGNOSIS — G8250 Quadriplegia, unspecified: Secondary | ICD-10-CM | POA: Diagnosis not present

## 2022-09-17 NOTE — Progress Notes (Signed)
PROGRESS NOTE   Subjective/Complaints: Pt feels good today , no pain Starting to develop sensation for BM vs bowel gas, urinating ok  ROS:  Pt denies SOB, abd pain, CP, N/V/C/D,      Objective:   No results found. No results for input(s): "WBC", "HGB", "HCT", "PLT" in the last 72 hours.  No results for input(s): "NA", "K", "CL", "CO2", "GLUCOSE", "BUN", "CREATININE", "CALCIUM" in the last 72 hours.   Intake/Output Summary (Last 24 hours) at 09/17/2022 1554 Last data filed at 09/17/2022 1536 Gross per 24 hour  Intake 387 ml  Output 930 ml  Net -543 ml         Physical Exam: Vital Signs Blood pressure (!) 132/94, pulse 99, temperature 98.4 F (36.9 C), resp. rate 16, height 5' 2.99" (1.6 m), weight 41.1 kg, SpO2 99 %.     General: No acute distress Mood and affect are appropriate Heart: Regular rate and rhythm no rubs murmurs or extra sounds Lungs: Clear to auscultation, breathing unlabored, no rales or wheezes Abdomen: Positive bowel sounds, soft nontender to palpation, nondistended Extremities: No clubbing, cyanosis, or edema   Skin: Clean and intact without signs of breakdown, posterior cervical spine incision with dry dressing in place- some dried blood- on dressing;  no erythema; C/D/I Neuro:  Alert and appropriate without dysarthria, speech clear with normal rate.  Bilateral ataxia worse on right.  Altered sensation to light touch primarily in the hands and feet bilaterally.  Cranial nerves II through XII intact, Right upper extremity shoulder abduction 1-2, elbow flexion 4- out of 5, elbow extension 2 out of 5, finger flexion 4-/5 Left upper extremity shoulder abduction 1-2, elbow flexion 4- out of 5, elbow extension 1-2 out of 5, finger flexion 4-/5 Left lower extremity strength 4 out of 5, right lower extremity strength 4- out of 5 Musculoskeletal: No joint swelling or tenderness noted Left wrist  PIV     Assessment/Plan: 1. Functional deficits which require 3+ hours per day of interdisciplinary therapy in a comprehensive inpatient rehab setting. Physiatrist is providing close team supervision and 24 hour management of active medical problems listed below. Physiatrist and rehab team continue to assess barriers to discharge/monitor patient progress toward functional and medical goals  Care Tool:  Bathing    Body parts bathed by patient: Chest, Abdomen, Front perineal area, Right upper leg, Left upper leg, Face   Body parts bathed by helper: Buttocks, Right arm, Left arm, Right lower leg, Left lower leg     Bathing assist Assist Level: Maximal Assistance - Patient 24 - 49%     Upper Body Dressing/Undressing Upper body dressing   What is the patient wearing?: Pull over shirt    Upper body assist Assist Level: Maximal Assistance - Patient 25 - 49%    Lower Body Dressing/Undressing Lower body dressing      What is the patient wearing?: Underwear/pull up     Lower body assist Assist for lower body dressing: Total Assistance - Patient < 25%     Toileting Toileting    Toileting assist Assist for toileting: Maximal Assistance - Patient 25 - 49%     Transfers Chair/bed transfer  Transfers assist     Chair/bed transfer assist level: Moderate Assistance - Patient 50 - 74%     Locomotion Ambulation   Ambulation assist      Assist level: Moderate Assistance - Patient 50 - 74% Assistive device: Walker-rolling Max distance: 60 ft   Walk 10 feet activity   Assist     Assist level: Moderate Assistance - Patient - 50 - 74% Assistive device: Walker-rolling   Walk 50 feet activity   Assist    Assist level: Moderate Assistance - Patient - 50 - 74% Assistive device: Walker-rolling    Walk 150 feet activity   Assist Walk 150 feet activity did not occur: Safety/medical concerns         Walk 10 feet on uneven surface  activity   Assist Walk  10 feet on uneven surfaces activity did not occur: Safety/medical concerns         Wheelchair     Assist Is the patient using a wheelchair?: Yes Type of Wheelchair: Manual    Wheelchair assist level: Supervision/Verbal cueing Max wheelchair distance: 100 ft    Wheelchair 50 feet with 2 turns activity    Assist        Assist Level: Supervision/Verbal cueing   Wheelchair 150 feet activity     Assist      Assist Level: Minimal Assistance - Patient > 75%   Blood pressure (!) 132/94, pulse 99, temperature 98.4 F (36.9 C), resp. rate 16, height 5' 2.99" (1.6 m), weight 41.1 kg, SpO2 99 %.  Medical Problem List and Plan: 1. Functional deficits secondary to incomplete nontraumatic quadriplegia due to metastatic breast cancer              -patient may  shower             -ELOS/Goals: 12 to 14 days, PT OT min assist           Con't CIR- PT and OT- in cervical collar 2.  Antithrombotics: -DVT/anticoagulation:  Pharmaceutical: Lovenox 1/19- the Dopplers are negative              -antiplatelet therapy: N/A 3. Pain Management:  Low dose oxycodone, tylenol 650 every 4 hours as needed and flexeril prn --On gabapentin 100 mg 3 times daily for  neuropathy.  1/18- pain controlled right now- con't regimen and monitor while doing therapy 1/20- al level SCI pain- increased gabapentin to 200 mg TID 4. Mood/Behavior/Sleep: LCSW to follow for evaluation and support.             --trazodone prn for insomnia. Continue ativan prn for anxiety.  Continue Celexa for mood.             -antipsychotic agents: N/A 5. Neuropsych/cognition: This patient is capable of making decisions on her own behalf. 6. Skin/Wound Care: Routine pressure relief measures.  7. Fluids/Electrolytes/Nutrition: Monitor I/O. Check CMET in am. 8. Breast cancer with mets to brain/spine (liver/lung?): To start XRT 01/24 per chart review             --abnormal LFTs with A phos-721  1/18- Brain MRI showed lesions  1-2+ mm worse and 2 new lesions- concerning, but starts radiation 1/24 while in rehab  1/19- spoke with SW- will move appointments to afternoon so can participate in therapy first.   1/20- d/w pt             -Followed by Dr. Truitt Merle oncology 9. Hyponatremia: Mild with sodium at 134.  Recheck lytes  in am for follow up 10. Leucocytosis: WBC 13.4 on 01/14--> repeat CBC in am.              --Monitor for fevers and other signs of infection.   1/18- WBC down to 9.3- doing better 11. High blood pressure: SBP 140-150 range. Monitor for trends and with activity. .  --Will order orthostatic vitals as dizziness reported with activity 09/07/17- - BP's still 710G-269S systolic- don't want to add another medicine right now- will wait to add meds.   1/20- don't want to add more meds, esp because SCI pts have a tendency to orthostatic hypotension Vitals:   09/17/22 0538 09/17/22 1333  BP: (!) 145/70 (!) 132/94  Pulse: 96 99  Resp: 17 16  Temp: 98.2 F (36.8 C) 98.4 F (36.9 C)  SpO2: 98% 99%   Good range 1/21 12. Neurogenic bowel- on bowel program: Had BM with use of enema and none for 2-3 days.  --Dose of miralax again today followed by suppository after supper.  -Dulcolax suppository daily, MiraLAX as needed, Senokot daily, sorbitol as needed  1/18- small BM with bowel program last night- con't regimen 1/19- Small BM last night- since soft, won't increase Colace, but will increase Senna to 1 tab BID 1/20- LBM overnight with suppository 13. Neurogenic bladder: Did have incontinence at admission that's better but has been using purewick and reports that she's getting up wet.              --monitor voiding with PVR/bladder scans. Check UA. 1/18- U/A negative- is voiding some with urinal- will con't PVRs   1/20- voiding with urinal- hasn't needed cathing 14 hypothyroidism.  Continue Synthroid 75 mcg daily. 15. At level SCI pain- is actually in thoracic area, but is textbook- will increase gabapentin  to 200 mg TID and monitor pain issues- explained will never go away, but can get better      LOS: 4 days A FACE TO FACE EVALUATION WAS PERFORMED  Charlett Blake 09/17/2022, 3:54 PM

## 2022-09-18 ENCOUNTER — Ambulatory Visit: Payer: BC Managed Care – PPO

## 2022-09-18 ENCOUNTER — Ambulatory Visit: Payer: BC Managed Care – PPO | Admitting: Radiation Oncology

## 2022-09-18 ENCOUNTER — Encounter: Payer: BC Managed Care – PPO | Admitting: Genetic Counselor

## 2022-09-18 ENCOUNTER — Other Ambulatory Visit: Payer: BC Managed Care – PPO

## 2022-09-18 ENCOUNTER — Telehealth: Payer: Self-pay

## 2022-09-18 DIAGNOSIS — C7951 Secondary malignant neoplasm of bone: Secondary | ICD-10-CM | POA: Insufficient documentation

## 2022-09-18 DIAGNOSIS — Z170 Estrogen receptor positive status [ER+]: Secondary | ICD-10-CM | POA: Insufficient documentation

## 2022-09-18 DIAGNOSIS — C799 Secondary malignant neoplasm of unspecified site: Secondary | ICD-10-CM | POA: Insufficient documentation

## 2022-09-18 DIAGNOSIS — C50312 Malignant neoplasm of lower-inner quadrant of left female breast: Secondary | ICD-10-CM | POA: Diagnosis not present

## 2022-09-18 DIAGNOSIS — Z510 Encounter for antineoplastic radiation therapy: Secondary | ICD-10-CM | POA: Insufficient documentation

## 2022-09-18 DIAGNOSIS — C7931 Secondary malignant neoplasm of brain: Secondary | ICD-10-CM | POA: Diagnosis not present

## 2022-09-18 DIAGNOSIS — G959 Disease of spinal cord, unspecified: Secondary | ICD-10-CM | POA: Diagnosis not present

## 2022-09-18 LAB — CBC
HCT: 39.7 % (ref 36.0–46.0)
Hemoglobin: 13.9 g/dL (ref 12.0–15.0)
MCH: 32.3 pg (ref 26.0–34.0)
MCHC: 35 g/dL (ref 30.0–36.0)
MCV: 92.1 fL (ref 80.0–100.0)
Platelets: 240 10*3/uL (ref 150–400)
RBC: 4.31 MIL/uL (ref 3.87–5.11)
RDW: 13.2 % (ref 11.5–15.5)
WBC: 8.3 10*3/uL (ref 4.0–10.5)
nRBC: 0 % (ref 0.0–0.2)

## 2022-09-18 MED ORDER — PANTOPRAZOLE SODIUM 40 MG PO TBEC
40.0000 mg | DELAYED_RELEASE_TABLET | Freq: Every day | ORAL | Status: DC
  Administered 2022-09-18 – 2022-09-27 (×10): 40 mg via ORAL
  Filled 2022-09-18 (×10): qty 1

## 2022-09-18 NOTE — Progress Notes (Signed)
Occupational Therapy Session Note  Patient Details  Name: Martha [name] MRN: [id number] Date of Birth: 08/31/1960  Today's Date: 09/18/2022 OT Individual Time: 1136-1200 OT Individual Time Calculation (min): 24 min    Short Term Goals: Week 1:  OT Short Term Goal 1 (Week 1): Pt will complete LB dressing using AE PRN with min A OT Short Term Goal 2 (Week 1): Pt will report a pain level of 5/10 or less for 2 consecutive sessions OT Short Term Goal 3 (Week 1): Pt will complete toilet transfer with CGA using LRAD  Skilled Therapeutic Interventions/Progress Updates:    Pt received in w/c with 5/10 pain in her B flanks but reporting she is premedicated and willing to participate in sessions. Pt taken via w/c to the therapy gym. She stood and completed BUE bimanual coordination task with (S), min facilitation for more difficult gross grasp tasks. Activity also addressing static standing balance with min A required for frequent perturbations. She then transitioned to sitting EOM. She held a large ball bimanually and stood x5 repetitions for 2 trials. She required min A overall. Activity addressing ADL transfers and BLE strengthening. She returned to her room and was left sitting up in the recliner with all needs met.   Therapy Documentation Precautions:  Precautions Precautions: Cervical, Fall Precaution Comments: verbally reviewed and pt able to recall with minimal cueing Required Braces or Orthoses: Cervical Brace Cervical Brace: Hard collar Restrictions Weight Bearing Restrictions: No  Therapy/Group: Individual Therapy  Curtis Sites 09/18/2022, 6:24 AM

## 2022-09-18 NOTE — Progress Notes (Signed)
Physical Therapy Session Note  Patient Details  Name: Martha [name] MRN: [id number] Date of Birth: 11-03-1960  Today's Date: 09/18/2022 PT Individual Time: 0800-0900 PT Individual Time Calculation (min): 60 min   Short Term Goals: Week 1:  PT Short Term Goal 1 (Week 1): Pt will ambulate x 50 ft with assist and LRAD PT Short Term Goal 2 (Week 1): Pt will perform bed mobility with CGA PT Short Term Goal 3 (Week 1): Pt will sit OOB between therapy sessions for improved activity tolerance.  Skilled Therapeutic Interventions/Progress Updates:    pt received in bed and agreeable to therapy. Pt reports no pain at rest. Provided distraction and redirection for pain this AM with very good success. Sit to stand and Stand pivot transfer with min A and RW. Pt transported to therapy gym for time management and energy conservation. Session focused on Sit to stand mechanics for decreased UE reliance and improved mechanics. Performed 2 x 6 with minimal to no UE support with extended seated rest breaks for fatigue. Pt able to perform with as little as CGA and cueing, but was extremely fatigued. Pt performed wrist and bicep curls with bocce ball for gentle strengthening during BLE rest break. Pt then performed 2 x 20 step taps on 6" step for improved coordination and single leg stance stability with min A and RW. Pt returned to room and remained in w/c, was left with all needs in reach and alarm active.   Therapy Documentation Precautions:  Precautions Precautions: Cervical, Fall Precaution Comments: verbally reviewed and pt able to recall with minimal cueing Required Braces or Orthoses: Cervical Brace Cervical Brace: Hard collar Restrictions Weight Bearing Restrictions: No General:    Therapy/Group: Individual Therapy  Mickel Fuchs 09/18/2022, 12:29 PM

## 2022-09-18 NOTE — Progress Notes (Signed)
PROGRESS NOTE   Subjective/Complaints: Pt reports pain doing better- at level SCI pain as well as L shoulder and R hip pain- worked/better with muscle relaxant.   Just had BM on bedpan.  Peeing OK with urinal- asking when can use BSC.   Eating 50% of tray- having husband bring in ensure and also taking prosource.   Also having Reflex- will add Protonix.   ROS:  Pt denies SOB, abd pain, CP, N/V/C/D, and vision changes    Objective:   No results found. Recent Labs    09/18/22 0537  WBC 8.3  HGB 13.9  HCT 39.7  PLT 240   No results for input(s): "NA", "K", "CL", "CO2", "GLUCOSE", "BUN", "CREATININE", "CALCIUM" in the last 72 hours.   Intake/Output Summary (Last 24 hours) at 09/18/2022 0814 Last data filed at 09/18/2022 0322 Gross per 24 hour  Intake 190 ml  Output 850 ml  Net -660 ml        Physical Exam: Vital Signs Blood pressure 136/76, pulse 93, temperature 97.9 F (36.6 C), temperature source Oral, resp. rate 18, height 5' 2.99" (1.6 m), weight 41.1 kg, SpO2 96 %.      General: awake, alert, appropriate, supine in bed; NAD HENT: conjugate gaze; oropharynx moist; cervical collar in place CV: regular rate and rhythm- in 90s; no JVD Pulmonary: CTA B/L; no W/R/R- good air movement GI: soft, NT, ND, (+)BS- just had BM Psychiatric: appropriate- interactive Neurological: Ox3  Skin: Clean and intact without signs of breakdown, posterior cervical spine incision with dry dressing in place- some dried blood- on dressing;  no erythema; C/D/I- stable Neuro:  Alert and appropriate without dysarthria, speech clear with normal rate.  Bilateral ataxia worse on right.  Altered sensation to light touch primarily in the hands and feet bilaterally.  Cranial nerves II through XII intact, Right upper extremity shoulder abduction 1-2, elbow flexion 4- out of 5, elbow extension 2 out of 5, finger flexion 4-/5 Left upper  extremity shoulder abduction 1-2, elbow flexion 4- out of 5, elbow extension 1-2 out of 5, finger flexion 4-/5 Left lower extremity strength 4 out of 5, right lower extremity strength 4- out of 5 Musculoskeletal: No joint swelling or tenderness noted Left wrist PIV     Assessment/Plan: 1. Functional deficits which require 3+ hours per day of interdisciplinary therapy in a comprehensive inpatient rehab setting. Physiatrist is providing close team supervision and 24 hour management of active medical problems listed below. Physiatrist and rehab team continue to assess barriers to discharge/monitor patient progress toward functional and medical goals  Care Tool:  Bathing    Body parts bathed by patient: Chest, Abdomen, Front perineal area, Right upper leg, Left upper leg, Face   Body parts bathed by helper: Buttocks, Right arm, Left arm, Right lower leg, Left lower leg     Bathing assist Assist Level: Maximal Assistance - Patient 24 - 49%     Upper Body Dressing/Undressing Upper body dressing   What is the patient wearing?: Pull over shirt    Upper body assist Assist Level: Maximal Assistance - Patient 25 - 49%    Lower Body Dressing/Undressing Lower body dressing  What is the patient wearing?: Underwear/pull up     Lower body assist Assist for lower body dressing: Total Assistance - Patient < 25%     Toileting Toileting    Toileting assist Assist for toileting: Maximal Assistance - Patient 25 - 49%     Transfers Chair/bed transfer  Transfers assist     Chair/bed transfer assist level: Moderate Assistance - Patient 50 - 74%     Locomotion Ambulation   Ambulation assist      Assist level: Moderate Assistance - Patient 50 - 74% Assistive device: Walker-rolling Max distance: 60 ft   Walk 10 feet activity   Assist     Assist level: Moderate Assistance - Patient - 50 - 74% Assistive device: Walker-rolling   Walk 50 feet activity   Assist     Assist level: Moderate Assistance - Patient - 50 - 74% Assistive device: Walker-rolling    Walk 150 feet activity   Assist Walk 150 feet activity did not occur: Safety/medical concerns         Walk 10 feet on uneven surface  activity   Assist Walk 10 feet on uneven surfaces activity did not occur: Safety/medical concerns         Wheelchair     Assist Is the patient using a wheelchair?: Yes Type of Wheelchair: Manual    Wheelchair assist level: Supervision/Verbal cueing Max wheelchair distance: 100 ft    Wheelchair 50 feet with 2 turns activity    Assist        Assist Level: Supervision/Verbal cueing   Wheelchair 150 feet activity     Assist      Assist Level: Minimal Assistance - Patient > 75%   Blood pressure 136/76, pulse 93, temperature 97.9 F (36.6 C), temperature source Oral, resp. rate 18, height 5' 2.99" (1.6 m), weight 41.1 kg, SpO2 96 %.  Medical Problem List and Plan: 1. Functional deficits secondary to incomplete nontraumatic quadriplegia due to metastatic breast cancer              -patient may  shower             -ELOS/Goals: 12 to 14 days, PT OT min assist           Con't CIR- PT and OT- team conference tomorrow to determine length of stay 2.  Antithrombotics: -DVT/anticoagulation:  Pharmaceutical: Lovenox 1/19- the Dopplers are negative              -antiplatelet therapy: N/A 3. Pain Management:  Low dose oxycodone, tylenol 650 every 4 hours as needed and flexeril prn --On gabapentin 100 mg 3 times daily for  neuropathy.  1/18- pain controlled right now- con't regimen and monitor while doing therapy 1/20- al level SCI pain- increased gabapentin to 200 mg TID  1/22- shoulder and hip pain better with muscle relaxants- and nerve pain better with increase in gabapentin- con't regimen and monitor 4. Mood/Behavior/Sleep: LCSW to follow for evaluation and support.             --trazodone prn for insomnia. Continue ativan prn for  anxiety.  Continue Celexa for mood.             -antipsychotic agents: N/A 5. Neuropsych/cognition: This patient is capable of making decisions on her own behalf. 6. Skin/Wound Care: Routine pressure relief measures.  7. Fluids/Electrolytes/Nutrition: Monitor I/O. Check CMET in am. 8. Breast cancer with mets to brain/spine (liver/lung?): To start XRT 01/24 per chart review             --  abnormal LFTs with A phos-721  1/18- Brain MRI showed lesions 1-2+ mm worse and 2 new lesions- concerning, but starts radiation 1/24 while in rehab  1/19- spoke with SW- will move appointments to afternoon so can participate in therapy first.   1/20- d/w pt             -Followed by Dr. Truitt Merle oncology 9. Hyponatremia: Mild with sodium at 134.  Recheck lytes in am for follow up 10. Leucocytosis: WBC 13.4 on 01/14--> repeat CBC in am.              --Monitor for fevers and other signs of infection.   1/18- WBC down to 9.3- doing better 11. High blood pressure: SBP 140-150 range. Monitor for trends and with activity. .  --Will order orthostatic vitals as dizziness reported with activity 09/07/17- - BP's still 175Z-025E systolic- don't want to add another medicine right now- will wait to add meds.   1/20- don't want to add more meds, esp because SCI pts have a tendency to orthostatic hypotension Vitals:   09/17/22 2044 09/18/22 0322  BP: (!) 146/72 136/76  Pulse: 97 93  Resp: 16 18  Temp: 98.9 F (37.2 C) 97.9 F (36.6 C)  SpO2: 98% 96%    1/22- BP slightly elevated- this AM, but otherwise is controlled- con't regimen 12. Neurogenic bowel- on bowel program: Had BM with use of enema and none for 2-3 days.  --Dose of miralax again today followed by suppository after supper.  -Dulcolax suppository daily, MiraLAX as needed, Senokot daily, sorbitol as needed  1/18- small BM with bowel program last night- con't regimen 1/19- Small BM last night- since soft, won't increase Colace, but will increase Senna to 1  tab BID 1/20- LBM overnight with suppository 1/22- having BM's- with suppository and 1 this AM- good sized, soft and on bedpan.  13. Neurogenic bladder: Did have incontinence at admission that's better but has been using purewick and reports that she's getting up wet.              --monitor voiding with PVR/bladder scans. Check UA. 1/18- U/A negative- is voiding some with urinal- will con't PVRs   1/20- voiding with urinal- hasn't needed cathing 1/22- voiding with urinal- asking if can get up to [facility] soon- will d/w therapy 14 hypothyroidism.  Continue Synthroid 75 mcg daily. 15. At level SCI pain- is actually in thoracic area, but is textbook- will increase gabapentin to 200 mg TID and monitor pain issues- explained will never go away, but can get better  1/22- pain is doing a little better with increase in gabapentin 16. GERD  1/22- will add Protonix 40 mg daily for reflux Sx's- Tums didn't work.   I spent a total of 36   minutes on total care today- >50% coordination of care- due to  D/w nursing about meds; education on reflux and meds; and d/w pt about B/B   LOS: 5 days A FACE TO FACE EVALUATION WAS PERFORMED  Adajah Cocking 09/18/2022, 8:14 AM

## 2022-09-18 NOTE — Telephone Encounter (Signed)
Rn called [facility] on 4w to make sure pt did not any special considerations for transport on Wednesday for her treatment and CT Sim. Rn Anderson [place] then called [facility] who stated someone had just called for transport and we are good to go for Wednesday. Rn also [place] aware of the above plans for transport and she stated she would pass the information on to the nursing staff as well.

## 2022-09-18 NOTE — Progress Notes (Signed)
Occupational Therapy Session Note  Patient Details  Name: Martha [name] MRN: [id number] Date of Birth: 07-20-1961  Today's Date: 09/18/2022 OT Individual Time: 9702-6378 OT Individual Time Calculation (min): 73 min    Short Term Goals: Week 1:  OT Short Term Goal 1 (Week 1): Pt will complete LB dressing using AE PRN with min A OT Short Term Goal 2 (Week 1): Pt will report a pain level of 5/10 or less for 2 consecutive sessions OT Short Term Goal 3 (Week 1): Pt will complete toilet transfer with CGA using LRAD  Skilled Therapeutic Interventions/Progress Updates:    Pt resting in w/c upon arrival. OT intervention with focus on standing balance, BUE functional use/coordination, safety awareness, and activity tolerance to increase independence with BADLs. Pt will have periodic assistance at home during the day. Standing activities at [facility] with focus on using BUE without support of RW. Pt initially required min A for balance fading to CGA after 4 trials. No LOB noted during last trial. Pt transitioned to table tasks with colored pegs with focus on open chain function.   9 Hole Peg Test is used to measure finger dexterity in pts with various neurological diagnoses. - Instructions The pt was instructed to pick up the pegs one at a time, using their dominant hand first and put them into the holes in any order until the holes were all filled. The pt then removed the pegs one at a time and returned them to the container. Both hands were tested separately.  - Results The pt completed the test in 46.39 seconds with RUE (pt dropped 2 pegs) and 36.37 seconds with LUE. Scores are based on the time taken to complete the activity. The timer started the moment the pt touched the first peg until the moment the last peg hit the container.  - Norms for healthy females ages 21-70+ 17-55 R 17.38 L 18.92 56-60 R 17.86 L 19.48 61-65 R 18.99 L 20.33 66-70 R 19.90 L 21.44 71+ R 22.49 L 24.11  Pt  returned to room and remained in w/c. All needs within reach and belt alarm activated.    Therapy Documentation Precautions:  Precautions Precautions: Cervical, Fall Precaution Comments: verbally reviewed and pt able to recall with minimal cueing Required Braces or Orthoses: Cervical Brace Cervical Brace: Hard collar Restrictions Weight Bearing Restrictions: No   Pain: Pain Assessment Pain Scale: 0-10 Pain Score: 5  Pain Type: Acute pain Pain Location: Shoulder Pain Orientation: Left Pain Radiating Towards: left posterior ribs Pain Descriptors / Indicators: Aching Pain Frequency: Constant Pain Onset: On-going Patients Stated Pain Goal: 3 Pain Intervention(s): Meds amin prior to therapy   Therapy/Group: Individual Therapy  Leroy Libman 09/18/2022, 11:08 AM

## 2022-09-18 NOTE — Progress Notes (Signed)
Patient ID: Martha [name], female   DOB: 1960/09/02, 62 y.o.   MRN: [id number]  SW returned phone call to [facility] to discuss challenges with future appointments except for 1/24. States she will follow-up.   SW spoke with [place] to arrange transportation for appt on 1/24. Pick up time is at 11am. SW informed appropriate staff.   *Reports she spoke with appropriate staff about changing appointments, and to check portal to see what time future appointments after 1/24 have been scheduled.   Updated appointments listed in portal. SW will schedule pick up appointments Friday/Monday due to setting up 1 week prior requirement per [facility].   Loralee Pacas, MSW, [place] Office: [phone] Cell: [phone] Fax: [phone]

## 2022-09-18 NOTE — Progress Notes (Signed)
Physical Therapy Session Note  Patient Details  Name: Martha [name] MRN: [id number] Date of Birth: [birth year]  Today's Date: 09/18/2022 PT Individual Time: 9741-6384 PT Individual Time Calculation (min): 42 min   Short Term Goals: Week 1:  PT Short Term Goal 1 (Week 1): Pt will ambulate x 50 ft with assist and LRAD PT Short Term Goal 2 (Week 1): Pt will perform bed mobility with CGA PT Short Term Goal 3 (Week 1): Pt will sit OOB between therapy sessions for improved activity tolerance.  Skilled Therapeutic Interventions/Progress Updates:     Pt received supine in bed and agrees to therapy. No complaint of pain. Supine to sit with minA/modA with cues for logrolling, hand placement, and sequencing. Pt performs stand step transfer to [facility] with minA and cues for position and increasing eccentric control of stand to sit. WC transport to gym. Pt performs sit to stand with minA, then ambulates x90' without AD, with cues for posture, lateral weight shifting, and positioning for safe transfer back to WC. Following extended seated rest break, pt stands and ambulates x40', verbalizing that she does not feel as steady and requesting to sit back down. Pt asking to defer ambulation due to feeling very fatigued at this time. Pt transfers to Nustep with minA/modA and cues for hand placement. Pt completes x10:00 on Nustep for reciprocal coordination and endurance training. Pt completes at workload of 5 with average steps per minute ~30. PT provides cues for hand and foot placement and completing full available ROM. Stand step from nustep>WC>bed with minA/modA. Left supine with all needs within reach.,  Therapy Documentation Precautions:  Precautions Precautions: Cervical, Fall Precaution Comments: verbally reviewed and pt able to recall with minimal cueing Required Braces or Orthoses: Cervical Brace Cervical Brace: Hard collar Restrictions Weight Bearing Restrictions: No   Therapy/Group: Individual  Therapy  Breck Coons, PT, DPT 09/18/2022, 4:36 PM

## 2022-09-19 ENCOUNTER — Ambulatory Visit: Payer: BC Managed Care – PPO

## 2022-09-19 ENCOUNTER — Telehealth: Payer: Self-pay | Admitting: Radiation Oncology

## 2022-09-19 DIAGNOSIS — G959 Disease of spinal cord, unspecified: Secondary | ICD-10-CM | POA: Diagnosis not present

## 2022-09-19 MED ORDER — BIOTENE DRY MOUTH MT LIQD: 15.0000 mL | OROMUCOSAL | Status: DC | PRN

## 2022-09-19 NOTE — Progress Notes (Signed)
Patient ID: Martha [name], female   DOB: 08/04/1961, 62 y.o.   MRN: [id number]  [id number] spoke with pt husband to provide updates from team conference, and d/c date 1/31. He confirms he received SW message yesterday about appointments. SW shared will change appts back to morning as his preference. Reports he will take her to treatment in the morning, and then go to work. SW discussed if he has FMLA forms. SW shared will have physician complete if he decides he would like to pursue. SW informed on specialty w/c, and will inform on when appt is scheduled. Will schedule fam edu when aware of w/c eval. Pt has a RW and w.c already. No other questions/concerns reported.   SW met with pt in room to discuss above. She is excited about d/c date. SW informed her she is scheduled for appt at cancer center tomorrow. SW also shared will work towards changing appointments back to morning time as preferred since no longer staying through treatment due to gains made in rehab. SW will update once w/c eval has been scheduled.   SW sent demo sheet to [place] spoke with [facility] about appt changes. Reports she will speak with pt tomorrow at there appt to adjust.  Loralee Pacas, MSW, [place] Office: [phone] Cell: [phone] Fax: [phone]

## 2022-09-19 NOTE — Telephone Encounter (Signed)
Patient's social worker left voicemail to change radiation back to 8am. Attempted to contact rad onc scheduling and Education officer, [employer]. Left voicemails.

## 2022-09-19 NOTE — Progress Notes (Signed)
Physical Therapy Session Note  Patient Details  Name: Martha [name] MRN: [id number] Date of Birth: [birth year]  Today's Date: 09/19/2022 PT Individual Time: 1345-1430 PT Individual Time Calculation (min): 45 min   Short Term Goals: Week 1:  PT Short Term Goal 1 (Week 1): Pt will ambulate x 50 ft with assist and LRAD PT Short Term Goal 2 (Week 1): Pt will perform bed mobility with CGA PT Short Term Goal 3 (Week 1): Pt will sit OOB between therapy sessions for improved activity tolerance.  Skilled Therapeutic Interventions/Progress Updates:    pt received in bed and agreeable to therapy. Pt reports intermittent pain with mobility during session, premedicated. Rest and positioning provided as needed. Donned socks and shoes with supervision. Session focused on ambulation and coordination. Pt ambulated x 120 ft with RW and CGA. Noted variable cadence and step length with ataxic gait. After rest break, added 1.5 lb ankle weights, which remained in place for rest of session. Pt ambulated x 170 ft and demoed mildly improved ataxia but continued variable cadence. Pt expressed fatigue and not wanting to continue. Provided education on the importance of therapy and learning limits of fatigue prior to going home, pt agreeable. Pt performed step taps on airex pad with cue to place foot lightly vs stomping, with good improvement. Pt then used kinetron x 5 minutes at 40 cm/sec for integration of steady cadence. While ambulating back to room, pt demoed improved cadence and gait quality. Pt returned to room and returned to bed with supervision, was left with all needs in reach and alarm active.   Therapy Documentation Precautions:  Precautions Precautions: Cervical, Fall Precaution Comments: verbally reviewed and pt able to recall with minimal cueing Required Braces or Orthoses: Cervical Brace Cervical Brace: Hard collar Restrictions Weight Bearing Restrictions: No General:       Therapy/Group:  Individual Therapy  Mickel Fuchs 09/19/2022, 4:12 PM

## 2022-09-19 NOTE — Progress Notes (Signed)
Physical Therapy Session Note  Patient Details  Name: Martha [name] MRN: [id number] Date of Birth: 06-14-1961  Today's Date: 09/19/2022 PT Individual Time: 1005-1105 PT Individual Time Calculation (min): 60 min   Short Term Goals: Week 1:  PT Short Term Goal 1 (Week 1): Pt will ambulate x 50 ft with assist and LRAD PT Short Term Goal 2 (Week 1): Pt will perform bed mobility with CGA PT Short Term Goal 3 (Week 1): Pt will sit OOB between therapy sessions for improved activity tolerance.  Skilled Therapeutic Interventions/Progress Updates: Pt presented in w/c agreeable to therapy. Pt denies pain at rest but noted some pain in neck/shoulder during session. Rest and repositioning provided during session. Pt transported to day room for energy conservation. Performed step pivot transfer to high/low mat with RW and CGA. Pt participated in Sit to stand without AD 2 x 5 for BLE strengthening and static balance. Participated in gait training with RW and light minA. Pt with slightly wider BOS and decreased weight shifting to R but no overt LOB. Pt then transferred to NuStep and participated in NuStep L5 x 10 min for general conditioning. Pt then stood and began ambulation back to room with pt having episode of bowel urgency as nearing day room bathroom. Pt able to ambulate to bathroom, and was able to complete LB clothing management and transfers with CGA (+BM). Once completed pt was able to ambulate out of bathroom with RW and transferred to w/c. Pt transported remaining distance to room and performed stand pivot transfer to bed. Completed sit to supine with supervision and use of bed features. Pt left in bed at end of session with bed alarm on, call bell within reach and needs met.      Therapy Documentation Precautions:  Precautions Precautions: Cervical, Fall Precaution Comments: verbally reviewed and pt able to recall with minimal cueing Required Braces or Orthoses: Cervical Brace Cervical  Brace: Hard collar Restrictions Weight Bearing Restrictions: No General:   Vital Signs: Therapy Vitals Temp: 98.2 F (36.8 C) Pulse Rate: (!) 103 Resp: 18 BP: 131/60 Patient Position (if appropriate): Lying Oxygen Therapy SpO2: 100 % O2 Device: Room Air Pain: Pain Assessment Pain Score: 0-No pain Faces Pain Scale: No hurt Mobility:   Locomotion :    Trunk/Postural Assessment :    Balance:   Exercises:   Other Treatments:      Therapy/Group: Individual Therapy  Beva Remund 09/19/2022, 4:37 PM

## 2022-09-19 NOTE — Progress Notes (Signed)
Occupational Therapy Session Note  Patient Details  Name: Martha [name] MRN: [id number] Date of Birth: Nov 08, 1960  Today's Date: 09/19/2022 OT Individual Time: 1135-1200 OT Individual Time Calculation (min): 25 min    Short Term Goals: Week 1:  OT Short Term Goal 1 (Week 1): Pt will complete LB dressing using AE PRN with min A OT Short Term Goal 2 (Week 1): Pt will report a pain level of 5/10 or less for 2 consecutive sessions OT Short Term Goal 3 (Week 1): Pt will complete toilet transfer with CGA using LRAD  Skilled Therapeutic Interventions/Progress Updates:    Pt resting in bed upon arrival and agreeable to getting OOB for therapy. Supine>sit EOB with supervision using bed rails. Stand pivot transfer with CGA. OT intervention with focus on [employer] and strengthening. Pt provided foam grip for pen and practiced handwriting. Pt instructed to practice making circles. [facility] strengthening with clothes pins. Pt able to weakly use pincher grip for yellow and red clothes pins consistently. Pt also retrieved beads from tan theraputty. Pt issued small green foam cubes for strengthening tasks. Pt remained seated in w/c with all needs within reach.   Therapy Documentation Precautions:  Precautions Precautions: Cervical, Fall Precaution Comments: verbally reviewed and pt able to recall with minimal cueing Required Braces or Orthoses: Cervical Brace Cervical Brace: Hard collar Restrictions Weight Bearing Restrictions: No    Pain: Pt reports 2/10 discomfort in Lt flank   Therapy/Group: Individual Therapy  Leroy Libman 09/19/2022, 12:15 PM

## 2022-09-19 NOTE — Progress Notes (Signed)
Occupational Therapy Session Note  Patient Details  Name: Martha [name] MRN: [id number] Date of Birth: 1961/04/03  Today's Date: 09/19/2022 OT Individual Time: 2482-5003 OT Individual Time Calculation (min): 75 min    Short Term Goals: Week 1:  OT Short Term Goal 1 (Week 1): Pt will complete LB dressing using AE PRN with min A OT Short Term Goal 2 (Week 1): Pt will report a pain level of 5/10 or less for 2 consecutive sessions OT Short Term Goal 3 (Week 1): Pt will complete toilet transfer with CGA using LRAD  Skilled Therapeutic Interventions/Progress Updates:    Pt resting in bed upon arrival. Pt requested bed pan but agreeable to walking to bathroom to use toilet. Supine>sit EOB with supervision using bed rails. Amb with RW to bathroom with CGA. Toileting with max A. Pt returned to room and sat in w/c at sink for bathing/dressing. Mod A for bathing tasks with sit<>stand. UB dressing with min A. LB dressing tasks with min A. Sit<>stand from w/c at sink with CGA. Pt transitioned to day room for table tasks with emphasis on [facility] and strengthening. Pt issued tan theraputty for hand/finger strengthening. Pt tasked with removing and replacing small beads in theraputty, rolling out theraputty and making into ball. Pt returned to room and reamined in w/c with all needs within reach.   Therapy Documentation Precautions:  Precautions Precautions: Cervical, Fall Precaution Comments: verbally reviewed and pt able to recall with minimal cueing Required Braces or Orthoses: Cervical Brace Cervical Brace: Hard collar Restrictions Weight Bearing Restrictions: No Pain: Pt c/o 2/10 Lt flank pain; repositioned   Therapy/Group: Individual Therapy  Leroy Libman 09/19/2022, 11:08 AM

## 2022-09-19 NOTE — Progress Notes (Signed)
PROGRESS NOTE   Subjective/Complaints:  Reflux a little better.  Having really dry mouth- educated some is meds; some is dry environment of hospital- will try Biotene- have mouthwash- but if wants something else, needs to get from [facility], etc, and have family bring in.    ROS:  Pt denies SOB, abd pain, CP, N/V/C/D, and vision changes Except per HPI  Objective:   No results found. Recent Labs    09/18/22 0537  WBC 8.3  HGB 13.9  HCT 39.7  PLT 240   No results for input(s): "NA", "K", "CL", "CO2", "GLUCOSE", "BUN", "CREATININE", "CALCIUM" in the last 72 hours.   Intake/Output Summary (Last 24 hours) at 09/19/2022 0852 Last data filed at 09/18/2022 1813 Gross per 24 hour  Intake 100 ml  Output 300 ml  Net -200 ml        Physical Exam: Vital Signs Blood pressure 126/61, pulse 93, temperature 98.2 F (36.8 C), resp. rate 18, height 5' 2.99" (1.6 m), weight 41.1 kg, SpO2 99 %.       General: awake, alert, appropriate, bright affect; supine in bed;  NAD HENT: conjugate gaze; oropharynx really dry; wearing cervical collar- posterior incision C/D/I CV: regular rate and rhythm; no JVD Pulmonary: CTA B/L; no W/R/R- good air movement GI: soft, NT, ND, (+)BS- normoactive Psychiatric: appropriate- bright, interactive Neurological: Ox3  Skin: Clean and intact without signs of breakdown, posterior cervical spine incision with dry dressing in place- some dried blood- on dressing;  no erythema; C/D/I- stable Neuro:  Alert and appropriate without dysarthria, speech clear with normal rate.  Bilateral ataxia worse on right.  Altered sensation to light touch primarily in the hands and feet bilaterally.  Cranial nerves II through XII intact, Right upper extremity shoulder abduction 1-2, elbow flexion 4- out of 5, elbow extension 2 out of 5, finger flexion 4-/5 Left upper extremity shoulder abduction 1-2, elbow flexion 4- out of  5, elbow extension 1-2 out of 5, finger flexion 4-/5 Left lower extremity strength 4 out of 5, right lower extremity strength 4- out of 5 Musculoskeletal: No joint swelling or tenderness noted Left wrist PIV     Assessment/Plan: 1. Functional deficits which require 3+ hours per day of interdisciplinary therapy in a comprehensive inpatient rehab setting. Physiatrist is providing close team supervision and 24 hour management of active medical problems listed below. Physiatrist and rehab team continue to assess barriers to discharge/monitor patient progress toward functional and medical goals  Care Tool:  Bathing    Body parts bathed by patient: Chest, Abdomen, Front perineal area, Right upper leg, Left upper leg, Face   Body parts bathed by helper: Buttocks, Right arm, Left arm, Right lower leg, Left lower leg     Bathing assist Assist Level: Maximal Assistance - Patient 24 - 49%     Upper Body Dressing/Undressing Upper body dressing   What is the patient wearing?: Pull over shirt    Upper body assist Assist Level: Maximal Assistance - Patient 25 - 49%    Lower Body Dressing/Undressing Lower body dressing      What is the patient wearing?: Underwear/pull up     Lower body assist Assist  for lower body dressing: Total Assistance - Patient < 25%     Toileting Toileting    Toileting assist Assist for toileting: Maximal Assistance - Patient 25 - 49%     Transfers Chair/bed transfer  Transfers assist     Chair/bed transfer assist level: Moderate Assistance - Patient 50 - 74%     Locomotion Ambulation   Ambulation assist      Assist level: Moderate Assistance - Patient 50 - 74% Assistive device: Walker-rolling Max distance: 60 ft   Walk 10 feet activity   Assist     Assist level: Moderate Assistance - Patient - 50 - 74% Assistive device: Walker-rolling   Walk 50 feet activity   Assist    Assist level: Moderate Assistance - Patient - 50 -  74% Assistive device: Walker-rolling    Walk 150 feet activity   Assist Walk 150 feet activity did not occur: Safety/medical concerns         Walk 10 feet on uneven surface  activity   Assist Walk 10 feet on uneven surfaces activity did not occur: Safety/medical concerns         Wheelchair     Assist Is the patient using a wheelchair?: Yes Type of Wheelchair: Manual    Wheelchair assist level: Supervision/Verbal cueing Max wheelchair distance: 100 ft    Wheelchair 50 feet with 2 turns activity    Assist        Assist Level: Supervision/Verbal cueing   Wheelchair 150 feet activity     Assist      Assist Level: Minimal Assistance - Patient > 75%   Blood pressure 126/61, pulse 93, temperature 98.2 F (36.8 C), resp. rate 18, height 5' 2.99" (1.6 m), weight 41.1 kg, SpO2 99 %.  Medical Problem List and Plan: 1. Functional deficits secondary to incomplete nontraumatic quadriplegia due to metastatic breast cancer              -patient may  shower             -ELOS/Goals: 12 to 14 days, PT OT min assist         Con't CIR- PT and OT- doesn't need SLP- team conference today to determine length of stay 2.  Antithrombotics: -DVT/anticoagulation:  Pharmaceutical: Lovenox 1/19- the Dopplers are negative              -antiplatelet therapy: N/A 3. Pain Management:  Low dose oxycodone, tylenol 650 every 4 hours as needed and flexeril prn --On gabapentin 100 mg 3 times daily for  neuropathy.  1/18- pain controlled right now- con't regimen and monitor while doing therapy 1/20- al level SCI pain- increased gabapentin to 200 mg TID  1/22- shoulder and hip pain better with muscle relaxants- and nerve pain better with increase in gabapentin- con't regimen and monitor 4. Mood/Behavior/Sleep: LCSW to follow for evaluation and support.             --trazodone prn for insomnia. Continue ativan prn for anxiety.  Continue Celexa for mood.             -antipsychotic  agents: N/A 5. Neuropsych/cognition: This patient is capable of making decisions on her own behalf. 6. Skin/Wound Care: Routine pressure relief measures.  7. Fluids/Electrolytes/Nutrition: Monitor I/O. Check CMET in am. 8. Breast cancer with mets to brain/spine (liver/lung?): To start XRT 01/24 per chart review             --abnormal LFTs with A [id number]-[id number]  1/18- Brain MRI  showed lesions 1-2+ mm worse and 2 new lesions- concerning, but starts radiation 1/24 while in rehab  1/19- spoke with SW- will move appointments to afternoon so can participate in therapy first.   1/23- d/w pt- so starts tomorrow 1/24             -Followed by Dr. Truitt Merle oncology 9. Hyponatremia: Mild with sodium at 134.  Recheck lytes in am for follow up 10. Leucocytosis: WBC 13.4 on 01/14--> repeat CBC in am.              --Monitor for fevers and other signs of infection.   1/18- WBC down to 9.3- doing better 11. High blood pressure: SBP 140-150 range. Monitor for trends and with activity. .  --Will order orthostatic vitals as dizziness reported with activity 09/07/17- - BP's still [id number] systolic- don't want to add another medicine right now- will wait to add meds.   1/20- don't want to add more meds, esp because SCI pts have a tendency to orthostatic hypotension Vitals:   09/18/22 2044 09/19/22 0630  BP: 130/70 126/61  Pulse: 96 93  Resp: 18 18  Temp: 98.4 F (36.9 C) 98.2 F (36.8 C)  SpO2: 98% 99%    1/22- BP slightly elevated- this AM, but otherwise is controlled- con't regimen  1/23- BP controlled- con't regimen 12. Neurogenic bowel- on bowel program: Had BM with use of enema and none for 2-3 days.  --Dose of miralax again today followed by suppository after supper.  -Dulcolax suppository daily, MiraLAX as needed, Senokot daily, sorbitol as needed  1/18- small BM with bowel program last night- con't regimen 1/19- Small BM last night- since soft, won't increase Colace, but will increase Senna to 1 tab  BID 1/20- LBM overnight with suppository 1/22- having BM's- with suppository and 1 this AM- good sized, soft and on bedpan.  13. Neurogenic bladder: Did have incontinence at admission that's better but has been using purewick and reports that she's getting up wet.              --monitor voiding with PVR/bladder scans. Check UA. 1/18- U/A negative- is voiding some with urinal- will con't PVRs   1/20- voiding with urinal- hasn't needed cathing 1/22- voiding with urinal- asking if can get up to [facility] soon- will d/w therapy 14 hypothyroidism.  Continue Synthroid 75 mcg daily. 15. At level SCI pain- is actually in thoracic area, but is textbook- will increase gabapentin to 200 mg TID and monitor pain issues- explained will never go away, but can get better  1/22- pain is doing a little better with increase in gabapentin 16. GERD  1/22- will add Protonix 40 mg daily for reflux Sx's- Tums didn't work.  17. Dry mouth- due to meds/environment  1/23- will add biotene mouthwash for pt. Is prn  I spent a total of 37   minutes on total care today- >50% coordination of care- due to  Team conference today and d/w pt about  about plans for radiation.   LOS: 6 days A FACE TO FACE EVALUATION WAS PERFORMED  Shontelle Muska 09/19/2022, 8:52 AM

## 2022-09-19 NOTE — Patient Care Conference (Signed)
Inpatient RehabilitationTeam Conference and Plan of Care Update Date: 09/19/2022   Time: 11:26 AM    Patient Name: Martha [name]      Medical Record Number: [id number]  Date of Birth: 04-12-1961 Sex: Female         Room/Bed: 4W14C/4W14C-01 Payor Info: Payor: [place] / Plan: BCBS COMM PPO / Product Type: *No Product type* /    Admit Date/Time:  09/13/2022  2:20 PM  Primary Diagnosis:  Acute incomplete quadriplegia [facility])  Hospital Problems: Principal Problem:   Acute incomplete quadriplegia ([place]) Active Problems:   Cervical myelopathy [facility])    Expected Discharge Date: Expected Discharge Date: 09/27/22  Team Members Present: Physician leading conference: Dr. Courtney Heys Social Worker Present: Loralee Pacas, [place] Nurse Present: Tacy Learn, RN PT Present: Ailene Rud, PT OT Present: Roanna Epley, COTA;Jennifer Tamala Julian, OT PPS Coordinator present : Gunnar Fusi, SLP     Current Status/Progress Goal Weekly Team Focus  Bowel/Bladder   Pt is continent of bowel/bladder   Pt will remain continent of bowel/bladder   Will assess qshift and PRN    Swallow/Nutrition/ Hydration               ADL's   bathing-mod A;max A; dressing-min A/mod A; transfers-min A   bathing-min A; dressing-supervision; toileting-supervision; transfers-supervision   activity tolerance, BADLs, safety awareness, education    Mobility   supine>sit with supervision, sit>supine with mod A, up to CGA for STS, gait with min A and RW   supervision gait 75 ft, CGA stairs, mod I w/c  gait mechanics, endurance, global strength    Communication                Safety/Cognition/ Behavioral Observations               Pain   Pt is currently pain free   Pt will remain pain free   Will assess qshift and PRN    Skin   Pt's incision on back of neck is intact   Pt's skin will remain intact  Will assess qshift and PRN      Discharge Planning:  Pt will d/c to home  with intermittent support from her husband who works during teh day. Plans to have family/friends who will stay with her during the day. Pt has inital cancer tx scheduled for 1/24 at 12pm. [facility] transportation arranged for appt. Future tx will begin on 2/1. Transportation will be arranged with [facility] for these appointments.   Team Discussion: Acute incomplete quadriplegia. Continent B/B with bowel program. Pain managed with PRN medications. Incision to neck is without drainage. Radiation tx starts tomorrow.B/P stable. Protonix started for GERD. Anxiety limiting factor with therapy. Has PRN ativan. Gait CGA. MaxA for toileting.  Evaluation for power w/c.  Patient on target to meet rehab goals: yes,   *See Care Plan and progress notes for long and short-term goals.   Revisions to Treatment Plan:  Medication adjustments, Stop PVR, continue bowel program, w/c evaluation, monitor labs   Teaching Needs: Medications, safety, bowel care, self care, gait/transfer training, skin care, etc.   Current Barriers to Discharge: Decreased caregiver support, Neurogenic bowel and bladder, and increase fatigue after radiation tx  Possible Resolutions to Barriers: Family education, nursing education, order recommended DME     Medical Summary Current Status: on ativan prn- for anxiety-Continent B/B with bowel program- incison looks OK  Barriers to Discharge: Behavior/Mood;Pending surgery/plan;Pending chemo/radiation;Medical stability;Self-care education;Weight bearing restrictions;Inadequate Nutritional Intake  Barriers to Discharge Comments:  home with husband- radiation tx to start tomorrow- walking CGA- max A toileting- LB CGA-min A Possible Resolutions to [employer] Focus: will d/c PVRs- bowel program will be needed- good spiritis- Supervision goals- dry mouth- biotene- protonix for GERD- need w/c evaluation for power w/c- BMI 16;  d/c 2/1   Continued Need for Acute Rehabilitation Level of Care: The  patient requires daily medical management by a physician with specialized training in physical medicine and rehabilitation for the following reasons: Direction of a multidisciplinary physical rehabilitation program to maximize functional independence : Yes Medical management of patient stability for increased activity during participation in an intensive rehabilitation regime.: Yes Analysis of laboratory values and/or radiology reports with any subsequent need for medication adjustment and/or medical intervention. : Yes   I attest that I was present, lead the team conference, and concur with the assessment and plan of the team.   Ernest Pine 09/19/2022, 3:21 PM

## 2022-09-20 ENCOUNTER — Ambulatory Visit: Payer: BC Managed Care – PPO

## 2022-09-20 ENCOUNTER — Ambulatory Visit
Admission: RE | Admit: 2022-09-20 | Discharge: 2022-09-20 | Disposition: A | Discharge status: Home / Self Care | Payer: BC Managed Care – PPO | Source: Ambulatory Visit | Attending: Radiation Oncology | Admitting: Radiation Oncology

## 2022-09-20 ENCOUNTER — Ambulatory Visit: Payer: BC Managed Care – PPO | Admitting: Radiation Oncology

## 2022-09-20 ENCOUNTER — Other Ambulatory Visit: Payer: Self-pay

## 2022-09-20 DIAGNOSIS — C7931 Secondary malignant neoplasm of brain: Secondary | ICD-10-CM | POA: Diagnosis not present

## 2022-09-20 DIAGNOSIS — Z510 Encounter for antineoplastic radiation therapy: Secondary | ICD-10-CM | POA: Diagnosis not present

## 2022-09-20 DIAGNOSIS — C50312 Malignant neoplasm of lower-inner quadrant of left female breast: Secondary | ICD-10-CM | POA: Diagnosis not present

## 2022-09-20 DIAGNOSIS — C7951 Secondary malignant neoplasm of bone: Secondary | ICD-10-CM | POA: Diagnosis not present

## 2022-09-20 DIAGNOSIS — Z170 Estrogen receptor positive status [ER+]: Secondary | ICD-10-CM | POA: Diagnosis not present

## 2022-09-20 LAB — RAD ONC ARIA SESSION SUMMARY
Course Elapsed Days: 0
Plan Fractions Treated to Date: 1
Plan Prescribed Dose Per Fraction: 20 Gy
Plan Total Fractions Prescribed: 1
Plan Total Prescribed Dose: 20 Gy
Reference Point Dosage Given to Date: 20 Gy
Reference Point Session Dosage Given: 20 Gy
Session Number: 1

## 2022-09-20 MED ORDER — HYDROCODONE-ACETAMINOPHEN 5-325 MG PO TABS
1.0000 | ORAL_TABLET | ORAL | Status: DC | PRN
  Administered 2022-09-20 – 2022-09-27 (×11): 1 via ORAL
  Filled 2022-09-20 (×11): qty 1

## 2022-09-20 MED ORDER — CITALOPRAM HYDROBROMIDE 20 MG PO TABS
40.0000 mg | ORAL_TABLET | Freq: Every day | ORAL | Status: DC
  Administered 2022-09-20 – 2022-09-27 (×8): 40 mg via ORAL
  Filled 2022-09-20 (×8): qty 2

## 2022-09-20 NOTE — Progress Notes (Signed)
Pt refused Bowel program. States she has 2 BMs yesterday and 1 today. States would like to get back on her own bowel schedule again.

## 2022-09-20 NOTE — Progress Notes (Signed)
Patient ID: Martha [name], female   DOB: 1961/06/19, 62 y.o.   MRN: [id number]  SW returned phone call to [facility] [phone]) to clarify the reason for the change request in appointments as she was now scheduled to discharge earlier than anticipated and not stay through the duration of treatment due to gains made in rehab. SW waiting on follow-up.   *SW received return call to discuss above. Reports appointments will be changed once available.   Loralee Pacas, MSW, [place] Office: [phone] Cell: [phone] Fax: [phone]

## 2022-09-20 NOTE — Progress Notes (Signed)
Occupational Therapy Session Note  Patient Details  Name: Martha [name] MRN: [id number] Date of Birth: 1960/09/04  Today's Date: 09/20/2022 OT Individual Time: 1000-1040 OT Individual Time Calculation (min): 40 min    Short Term Goals: Week 1:  OT Short Term Goal 1 (Week 1): Pt will complete LB dressing using AE PRN with min A OT Short Term Goal 1 - Progress (Week 1): Met OT Short Term Goal 2 (Week 1): Pt will report a pain level of 5/10 or less for 2 consecutive sessions OT Short Term Goal 2 - Progress (Week 1): Met OT Short Term Goal 3 (Week 1): Pt will complete toilet transfer with CGA using LRAD OT Short Term Goal 3 - Progress (Week 1): Met  Skilled Therapeutic Interventions/Progress Updates:    Pt resting in w/c in day room upon arrival. Initial focus on BUE strengthening/AROM. Attempted use of 1# bar for chest presses but pt unable to push bar away from chest while seated in w/c. BUE AAROM with emphasis on scapula protraction/retraction. Pt transitioned to table tasks with focus on [facility] using small pegs, shuffling cards, and stacking coins. Pt returned to room and transferred to EOB with CGA using RW. Pt remained in bed with all needs within reach and bed alarm activated.   Therapy Documentation Precautions:  Precautions Precautions: Cervical, Fall Precaution Comments: verbally reviewed and pt able to recall with minimal cueing Required Braces or Orthoses: Cervical Brace Cervical Brace: Hard collar Restrictions Weight Bearing Restrictions: No   Pain: Pain Assessment Pain Scale: 0-10 Pain Score: 1  Pain Type: Acute pain Pain Location: Back   Therapy/Group: Individual Therapy  Leroy Libman 09/20/2022, 10:48 AM

## 2022-09-20 NOTE — Progress Notes (Signed)
Occupational Therapy Weekly Progress Note  Patient Details  Name: Martha [name] MRN: [id number] Date of Birth: 31-Dec-1960  Beginning of progress report period: September 14, 2022 End of progress report period: September 20, 2022  Patient has met 3 of 3 short term goals.  Pt making steady progress with BADLs and functional transfers. Pt requires min A for UB/LB dressing tasks and min A for bathing with sit<>stand from w/c at sink. Pt requires CGA for functional amb with RW and transfers with RW. Pt fatigues quickly and requires multiple rest breaks. Min verbal cues for safety awareness. BUE ataxia and weakness noted during open chain tasks.   Patient continues to demonstrate the following deficits: muscle weakness, decreased cardiorespiratoy endurance, impaired timing and sequencing, unbalanced muscle activation, ataxia, and decreased coordination, and decreased standing balance, decreased postural control, and decreased balance strategies and therefore will continue to benefit from skilled OT intervention to enhance overall performance with BADL.  Patient progressing toward long term goals..  Continue plan of care.  OT Short Term Goals Week 1:  OT Short Term Goal 1 (Week 1): Pt will complete LB dressing using AE PRN with min A OT Short Term Goal 1 - Progress (Week 1): Met OT Short Term Goal 2 (Week 1): Pt will report a pain level of 5/10 or less for 2 consecutive sessions OT Short Term Goal 2 - Progress (Week 1): Met OT Short Term Goal 3 (Week 1): Pt will complete toilet transfer with CGA using LRAD OT Short Term Goal 3 - Progress (Week 1): Met Week 2:  OT Short Term Goal 1 (Week 2): STG=LTG 2/2 ELOS (continue working towards supervision LTGs)   Leroy Libman 09/20/2022, 8:16 AM

## 2022-09-20 NOTE — Op Note (Signed)
Name: Martha [name]    MRN: [id number]   Date: 09/20/2022    DOB: 06/04/1961   STEREOTACTIC RADIOSURGERY OPERATIVE NOTE  PRE-OPERATIVE DIAGNOSIS:  Metastatic brain disease  POST-OPERATIVE DIAGNOSIS:  Metastatic brain disease  PROCEDURE:   Stereotactic Radiosurgery using TrueBeam Linac device Stereotactic Radiosurgery using TrueBeam Linac device to 22 additional lesions  SURGEON:  Duffy Rhody, MD  RADIATION ONCOLOGIST: Eppie Gibson, MD  TECHNIQUE:  The patient underwent a radiation treatment planning session in the radiation oncology simulation suite under the care of the radiation oncology physician and physicist.  I participated closely in the radiation treatment planning afterwards. The patient underwent planning CT which was fused to 3T high resolution MRI with 1 mm axial slices.  These images were fused on the planning system.  We contoured the gross target volumes and subsequently expanded this to yield the Planning Target Volume. I actively participated in the planning process.  I helped to define and review the target contours and also the contours of the optic pathway, eyes, brainstem and selected nearby organs at risk.  All the dose constraints for critical structures were reviewed and compared to AAPM Task Group 101.  The prescription dose conformity was reviewed.  I approved the plan electronically.    Accordingly, [name]  was brought to the TrueBeam stereotactic radiation treatment linac and placed in the custom immobilization mask.  The patient was aligned according to the IR fiducial markers with BrainLab Exactrac, then orthogonal x-rays were used in ExacTrac with the 6DOF robotic table and the shifts were made to align the patient  [name] received stereotactic radiosurgery to a prescription dose of 20 Gy uneventfully to all 23 lesions.    The detailed description of the procedure is recorded in the radiation oncology procedure note.  I  was present for the duration of the procedure.  DISPOSITION:   Following delivery, the patient was transported to nursing in stable condition and monitored for possible acute effects to be discharged to home in stable condition with follow-up in one month.  Duffy Rhody, MD [facility]

## 2022-09-20 NOTE — Progress Notes (Signed)
PROGRESS NOTE   Subjective/Complaints:  Feeling a little less strong in RUE this AM- and has some numbness in RUE as well- but no increased pain.   We discussed her anxiety, esp with radiation today- suggested taking Benzo/Ativan with radiation- will also increase Celexa to help with anxiety Sx's.    Doesn't feel Oxy real helpful- knocks her out - was interested in switching pain meds.  ROS:  Pt denies SOB, abd pain, CP, N/V/C/D, and vision changes  Except per HPI  Objective:   No results found. Recent Labs    09/18/22 0537  WBC 8.3  HGB 13.9  HCT 39.7  PLT 240   No results for input(s): "NA", "K", "CL", "CO2", "GLUCOSE", "BUN", "CREATININE", "CALCIUM" in the last 72 hours.   Intake/Output Summary (Last 24 hours) at 09/20/2022 1020 Last data filed at 09/19/2022 2257 Gross per 24 hour  Intake 240 ml  Output 500 ml  Net -260 ml        Physical Exam: Vital Signs Blood pressure 136/70, pulse 93, temperature 98.2 F (36.8 C), resp. rate 14, height 5' 2.99" (1.6 m), weight 41.1 kg, SpO2 97 %.        General: awake, alert, appropriate, sitting up in w/c with OTA in room; small ptNAD HENT: conjugate gaze; oropharynx dry- wearing cervical collar- incision per Skin CV: regular rate- in 90's; no JVD Pulmonary: CTA B/L; no W/R/R- good air movement GI: soft, NT, ND, (+)BS Psychiatric: appropriate- bright, but underlying anxiety noted Neurological: Ox3  Skin: Posterior neck incision C?D/I- but underneath looks great- no erythema, no increased heat, no drainage- a few scabs Neuro:  Alert and appropriate without dysarthria, speech clear with normal rate.  Bilateral ataxia worse on right.  Altered sensation to light touch primarily in the hands and feet bilaterally.  Cranial nerves II through XII intact, Right upper extremity shoulder abduction 1-2, elbow flexion 4- out of 5, elbow extension 2 out of 5, finger flexion  4-/5 Left upper extremity shoulder abduction 1-2, elbow flexion 4- out of 5, elbow extension 1-2 out of 5, finger flexion 4-/5 Left lower extremity strength 4 out of 5, right lower extremity strength 4- out of 5 Musculoskeletal: No joint swelling or tenderness noted Left wrist PIV     Assessment/Plan: 1. Functional deficits which require 3+ hours per day of interdisciplinary therapy in a comprehensive inpatient rehab setting. Physiatrist is providing close team supervision and 24 hour management of active medical problems listed below. Physiatrist and rehab team continue to assess barriers to discharge/monitor patient progress toward functional and medical goals  Care Tool:  Bathing    Body parts bathed by patient: Right arm, Left arm, Chest, Abdomen, Front perineal area, Right upper leg, Left upper leg, Right lower leg, Left lower leg, Face   Body parts bathed by helper: Buttocks, Right arm, Left arm, Right lower leg, Left lower leg     Bathing assist Assist Level: Minimal Assistance - Patient > 75%     Upper Body Dressing/Undressing Upper body dressing   What is the patient wearing?: Pull over shirt    Upper body assist Assist Level: Minimal Assistance - Patient > 75%  Lower Body Dressing/Undressing Lower body dressing      What is the patient wearing?: Pants     Lower body assist Assist for lower body dressing: Contact Guard/Touching assist     Toileting Toileting    Toileting assist Assist for toileting: Maximal Assistance - Patient 25 - 49%     Transfers Chair/bed transfer  Transfers assist     Chair/bed transfer assist level: Moderate Assistance - Patient 50 - 74%     Locomotion Ambulation   Ambulation assist      Assist level: Moderate Assistance - Patient 50 - 74% Assistive device: Walker-rolling Max distance: 60 ft   Walk 10 feet activity   Assist     Assist level: Moderate Assistance - Patient - 50 - 74% Assistive device:  Walker-rolling   Walk 50 feet activity   Assist    Assist level: Moderate Assistance - Patient - 50 - 74% Assistive device: Walker-rolling    Walk 150 feet activity   Assist Walk 150 feet activity did not occur: Safety/medical concerns         Walk 10 feet on uneven surface  activity   Assist Walk 10 feet on uneven surfaces activity did not occur: Safety/medical concerns         Wheelchair     Assist Is the patient using a wheelchair?: Yes Type of Wheelchair: Manual    Wheelchair assist level: Supervision/Verbal cueing Max wheelchair distance: 100 ft    Wheelchair 50 feet with 2 turns activity    Assist        Assist Level: Supervision/Verbal cueing   Wheelchair 150 feet activity     Assist      Assist Level: Minimal Assistance - Patient > 75%   Blood pressure 136/70, pulse 93, temperature 98.2 F (36.8 C), resp. rate 14, height 5' 2.99" (1.6 m), weight 41.1 kg, SpO2 97 %.  Medical Problem List and Plan: 1. Functional deficits secondary to incomplete nontraumatic quadriplegia due to metastatic breast cancer              -patient may  shower             -ELOS/Goals: 12 to 14 days, PT OT min assist         Con't CIR PT and OT  Set d/c for 1/31 before starts other radiation.   Con't CIR- PT and OT- starts radiation today 2.  Antithrombotics: -DVT/anticoagulation:  Pharmaceutical: Lovenox 1/19- the Dopplers are negative              -antiplatelet therapy: N/A 3. Pain Management:  Low dose oxycodone, tylenol 650 every 4 hours as needed and flexeril prn --On gabapentin 100 mg 3 times daily for  neuropathy.  1/18- pain controlled right now- con't regimen and monitor while doing therapy 1/20- al level SCI pain- increased gabapentin to 200 mg TID  1/22- shoulder and hip pain better with muscle relaxants- and nerve pain better with increase in gabapentin- con't regimen and monitor 1/24- changed Oxy to Norco- suggested taking before leaves for  radiation 4. Mood/Behavior/Sleep: LCSW to follow for evaluation and support.             --trazodone prn for insomnia. Continue ativan prn for anxiety.  Continue Celexa for mood.  1/24 - due to increased anxiety, suggested taking Benzo before leaves and will increase Celexa to 40 mg daily             -antipsychotic agents: N/A 5. Neuropsych/cognition: This patient  is capable of making decisions on her own behalf. 6. Skin/Wound Care: Routine pressure relief measures.  7. Fluids/Electrolytes/Nutrition: Monitor I/O. Check CMET in am. 8. Breast cancer with mets to brain/spine (liver/lung?): To start XRT 01/24 per chart review             --abnormal LFTs with A phos-721  1/18- Brain MRI showed lesions 1-2+ mm worse and 2 new lesions- concerning, but starts radiation 1/24 while in rehab  1/19- spoke with SW- will move appointments to afternoon so can participate in therapy first.   1/23- d/w pt- so starts tomorrow 1/24             -Followed by Dr. Truitt Merle oncology 9. Hyponatremia: Mild with sodium at 134.  Recheck lytes in am for follow up 10. Leucocytosis: WBC 13.4 on 01/14--> repeat CBC in am.              --Monitor for fevers and other signs of infection.   1/18- WBC down to 9.3- doing better 11. High blood pressure: SBP 140-150 range. Monitor for trends and with activity. .  --Will order orthostatic vitals as dizziness reported with activity 09/07/17- - BP's still [id number] systolic- don't want to add another medicine right now- will wait to add meds.   1/20- don't want to add more meds, esp because SCI pts have a tendency to orthostatic hypotension Vitals:   09/19/22 1906 09/20/22 0540  BP: 118/70 136/70  Pulse: 96 93  Resp: 18 14  Temp: 98 F (36.7 C) 98.2 F (36.8 C)  SpO2: 99% 97%    1/22- BP slightly elevated- this AM, but otherwise is controlled- con't regimen  1/23- BP controlled- con't regimen 12. Neurogenic bowel- on bowel program: Had BM with use of enema and none for 2-3  days.  --Dose of miralax again today followed by suppository after supper.  -Dulcolax suppository daily, MiraLAX as needed, Senokot daily, sorbitol as needed  1/18- small BM with bowel program last night- con't regimen 1/19- Small BM last night- since soft, won't increase Colace, but will increase Senna to 1 tab BID 1/20- LBM overnight with suppository 1/22- having BM's- with suppository and 1 this AM- good sized, soft and on bedpan.  13. Neurogenic bladder: Did have incontinence at admission that's better but has been using purewick and reports that she's getting up wet.              --monitor voiding with PVR/bladder scans. Check UA. 1/18- U/A negative- is voiding some with urinal- will con't PVRs   1/20- voiding with urinal- hasn't needed cathing 1/22- voiding with urinal- asking if can get up to [facility] soon- will d/w therapy 14 hypothyroidism.  Continue Synthroid 75 mcg daily. 15. At level SCI pain- is actually in thoracic area, but is textbook- will increase gabapentin to 200 mg TID and monitor pain issues- explained will never go away, but can get better  1/22- pain is doing a little better with increase in gabapentin 16. GERD  1/22- will add Protonix 40 mg daily for reflux Sx's- Tums didn't work.  17. Dry mouth- due to meds/environment  1/23- will add biotene mouthwash for pt. Is prn    I spent a total of  41  minutes on total care today- >50% coordination of care- due to  D/w pt prolonged about anxiety and pain meds- with therapy as well and how anxiety related to therapy.   LOS: 7 days A FACE TO FACE EVALUATION WAS PERFORMED  Martha [name] 09/20/2022, 10:20 AM

## 2022-09-20 NOTE — Progress Notes (Signed)
Physical Therapy Session Note  Patient Details  Name: Martha [name] MRN: [id number] Date of Birth: 13-May-1961  Today's Date: 09/20/2022 PT Individual Time: 0906-1005 PT Individual Time Calculation (min): 59 min   Short Term Goals: Week 1:  PT Short Term Goal 1 (Week 1): Pt will ambulate x 50 ft with assist and LRAD PT Short Term Goal 2 (Week 1): Pt will perform bed mobility with CGA PT Short Term Goal 3 (Week 1): Pt will sit OOB between therapy sessions for improved activity tolerance.   Skilled Therapeutic Interventions/Progress Updates:  Patient supine in bed on entrance to room. Patient alert and agreeable to PT session.   Patient with minimal pain complaint at start of session. Is excited that she will begin radiation therapy this afternoon.   Therapeutic Activity: Bed Mobility: Pt performed supine --> sit with light CGA/ supervision to L side of bed. No cueing required for technique. Transfers: Pt performed sit<>stand transfer from EOB requiring up to [place] to find balance upon stance. Education and demonstration re: biomechanics of rising to stand. With improved technique, she improves throughout session to supervision to intermittent CGA for balance.   Gait Training/ Neuromuscular Re-ed: NMR facilitated during session with focus on functional gait and dynamic standing balance. Pt guided in dynamic stepping task requiring pt to reach to numbers placed on wall from overhead height to mid thigh height and far outside of BOS requiring lateral stepping to reach all of numbers when called. Pt with one instance of feeling about to fall with over-reaction from toes pushing into PF and requiring MinA to maintain.   Pt then guided in agility ladder activity requiring pt to laterally step to L, then R in order to improve motor control and SL balance during mobility. Progressed to forward stepping both feet into each square, alternating LE for initiating step forward to next step. Two  instances of LOB requiring pt to reach out with UE to maintain balance. Light MinA provided to maintain balance. NMR performed for improvements in motor control and coordination, balance, sequencing, judgement, and self confidence/ efficacy in performing all aspects of mobility at highest level of independence.   Patient seated in w/c in dayroom at end of session with brakes locked, hand off of care to COTA for final session prior to transport to radiation appt.    Therapy Documentation Precautions:  Precautions Precautions: Cervical, Fall Precaution Comments: verbally reviewed and pt able to recall with minimal cueing Required Braces or Orthoses: Cervical Brace Cervical Brace: Hard collar Restrictions Weight Bearing Restrictions: No General:   Vital Signs:   Pain: Pain Assessment Pain Scale: 0-10 Pain Score: 2  Pain Type: Acute pain Pain Location: Back   Therapy/Group: Individual Therapy  Alger Simons PT, DPT, CSRS 09/20/2022, 10:19 AM

## 2022-09-20 NOTE — Progress Notes (Signed)
Occupational Therapy Session Note  Patient Details  Name: Martha [name] MRN: [id number] Date of Birth: Mar 22, 1961  Today's Date: 09/20/2022 OT Individual Time: 0700-0810 OT Individual Time Calculation (min): 70 min    Short Term Goals: Week 1:  OT Short Term Goal 1 (Week 1): Pt will complete LB dressing using AE PRN with min A OT Short Term Goal 2 (Week 1): Pt will report a pain level of 5/10 or less for 2 consecutive sessions OT Short Term Goal 3 (Week 1): Pt will complete toilet transfer with CGA using LRAD  Skilled Therapeutic Interventions/Progress Updates:    Pt resting in bed upon arrival. OT intervention with focus on bed mobility, standing balance, funcitonal amb with RW, BADLs with sit<>stand from w/c at sink, and BUE/BLE therex to increase independence with BADLs. Supine>sit EOB with supervision using bed rails. Amb with RW to sink with CGA. Pt requires assistance doffing/donning pullover shirt but completes all other dressing tasks without assistance. Standing balance with CGA/close supervision. NuStep 7 mins level 5. Amb with RW to EOM. Pt requested pain meds and returned to room. Pt remained in w/c with all needs within reach. RN present.   Therapy Documentation Precautions:  Precautions Precautions: Cervical, Fall Precaution Comments: verbally reviewed and pt able to recall with minimal cueing Required Braces or Orthoses: Cervical Brace Cervical Brace: Hard collar Restrictions Weight Bearing Restrictions: No Pain:  Pt reports 2/10 pain primarily in Lt flank; meds admin at end of session   Therapy/Group: Individual Therapy  Leroy Libman 09/20/2022, 8:11 AM

## 2022-09-21 ENCOUNTER — Ambulatory Visit: Payer: BC Managed Care – PPO

## 2022-09-21 NOTE — Evaluation (Signed)
Recreational Therapy Assessment and Plan  Patient Details  Name: Martha [name] MRN: [id number] Date of Birth: 09-Jan-1961 Today's Date: 09/21/2022  Rehab Potential:  Fair ELOS:   d/c 1/31  Assessment   Hospital Problem: Principal Problem:   Acute incomplete quadriplegia ([place]) Active Problems:   Cervical myelopathy ([place])     Past Medical History:      Past Medical History:  Diagnosis Date   Anxiety     Cancer ([place]) 2022    right breast LCIS   Cancer ([place]) 2019    left breast   Depression     Dysrhythmia      SVT, s/p ablation ~ 2012 in at [facility]   Ectopic pregnancy     Family history of breast cancer     Family history of melanoma     History of radiation therapy 04/22/18-06/03/18    Left Breast, left SCV, axilla 50 Gy in 25 fractions, Left breast boost 10 Gy in 5 fractions.    Hypothyroidism     Personal history of radiation therapy     PONV (postoperative nausea and vomiting)     Thyroid disease      Past Surgical History:       Past Surgical History:  Procedure Laterality Date   APPENDECTOMY       BILATERAL SALPINGECTOMY       BREAST BIOPSY Right 2014    fibroadenoma   BREAST BIOPSY Left 01/16/2018   BREAST BIOPSY Right 02/02/2020    LCIS   BREAST BIOPSY Right 08/04/2020    x2 LCIS   BREAST BIOPSY Right 08/13/2020    x2   BREAST EXCISIONAL BIOPSY Left 1990    benign   BREAST EXCISIONAL BIOPSY Right 02/02/2020    LCIS   BREAST LUMPECTOMY Left 01/22/2018   BREAST LUMPECTOMY WITH AXILLARY LYMPH NODE BIOPSY Left 02/21/2018    Procedure: LEFT BREAST LUMPECTOMY WITH AXILLARY LYMPH NODE BIOPSY;  Surgeon: [name], MD;  Location: [place];  Service: General;  Laterality: Left;   BREAST LUMPECTOMY WITH RADIOACTIVE SEED LOCALIZATION Right 03/18/2020    Procedure: RIGHT BREAST LUMPECTOMY WITH RADIOACTIVE SEED LOCALIZATION;  Surgeon: [name], MD;  Location: [place];  Service: General;  Laterality: Right;   BREAST SURGERY Left 1993     cyst removed   ENDOMETRIAL ABLATION       EYE SURGERY       GLAUCOMA SURGERY Bilateral     POSTERIOR CERVICAL FUSION/FORAMINOTOMY N/A 08/17/2022    Procedure: Cervical One-Cervical Four POSTERIOR CERVICAL FUSION,  Cervical One LAMINECTOMY REDUCTION OF Cervical Two Fracture, Biopsy of Right Cerical Two Pars  Lesion;  Surgeon: Vallarie Mare, MD;  Location: [place];  Service: Neurosurgery;  Laterality: N/A;   POSTERIOR CERVICAL FUSION/FORAMINOTOMY N/A 09/06/2022    Procedure: Posterior Cervical Fusion, Foraminotomy , Cervical Five-Six, Cervical Six-Seven; Extension of  fusion Cervical Four- Thoracic One;  Surgeon: Vallarie Mare, MD;  Location: [place];  Service: Neurosurgery;  Laterality: N/A;   RADIOACTIVE SEED GUIDED EXCISIONAL BREAST BIOPSY Right 04/28/2021    Procedure: RADIOACTIVE SEED GUIDED EXCISIONAL RIGHT BREAST BIOPSY X2;  Surgeon: [name], MD;  Location: [place];  Service: General;  Laterality: Right;   RE-EXCISION OF BREAST LUMPECTOMY Left 03/20/2018    Procedure: RE-EXCISION OF BREAST LUMPECTOMY;  Surgeon: [name], MD;  Location: [place];  Service: General;  Laterality: Left;      Assessment & Plan Clinical Impression:  Martha [name] is  a 62 year old female with history of left breast CA 01/2018  s/p lumpectomy & XRT, right  breast cancer 04/2021, neck pain w/torticollis treated with PT since 10/02 and follow up MRI C spine showed extensive metastatic disease with pathological Fx at base of dens and C2 right lateral mass with extraosseous tumor at C1 and C2 and degenerative changes C4-C7. She was placed in collar and referred to Dr. Collene Gobble.  PET scan done revealing large volume osseous mets with suspicion of hepatic mets as well as brain mets on MRI.  Dr. Burr Medico  recommended bone biopsy to decide on cancer tx and  goals of care  She underwent C1-C4 fusion with reduction of C2 fracture and bone biopsy on 08/17/22 by Dr. Marcello Moores and was  discharged to home 12/23 with plans to start XRT in 1-2 weeks.     Post op seen in ED for BUE pain with spasms treated with steroids and gabapentin. She was admitted on 09/04/22 with increased numbness and weakness in hands progressing to inability to walk as well as decreased sensation from waist down. MRI C/T/L spine done revealing stable fusion but multiple mild to  moderate pathological Fx T2,T5, T6, T10 and T11 as well as mildly progressive spinal stenosis C5 and C6 with cord flattening as well as possible lung mets. Case discussed with Dr. Lanell Persons and Dr. Burr Medico and patient elected on aggressive tx. She underwent laminectomy C4-C7 with extension of previous instrumentation and arthrodesis on 09/06/22.  JP drain removed and she was cleared to start Lovenox for DVT prophylaxis. She has had improvement in sensation BUE as well as strength. Issues with hallucinations felt to be narcotic related and oxycodone dose decreased.  She reports her sensation continues to be altered in her bilateral hands and feet.  She says that she would prefer to use Tylenol primarily to control her pain.  She says she was continent of bowel and bladder prior to her most recent hospital admission.  She does report issues with bladder urgency.  She has been using pure wick at the hospital.  MRI brain done today. PT/OT has been working with patient who continues to be limited by ataxia with sensory deficits, pain, fatigue, posterior lean, dizziness as well as decrease in control of BLE. CIR was recommended due to functional decline. Patient transferred to CIR on 09/13/2022 .     Pt presents with decreased activity tolerance, decreased functional mobility, decreased balance, decreased coordination, feelings of stress Limiting pt's independence with leisure/community pursuits.  Met with pt today to discuss TR services including leisure education, activity analysis/modifications and stress management.  Also discussed the importance of  social, emotional, spiritual health in addition to physical health and their effects on overall health and wellness.  Pt stated understanding.    Recommendations for other services: Neuropsych  Discharge Criteria: Patient will be discharged from TR if patient refuses treatment 3 consecutive times without medical reason.  If treatment goals not met, if there is a change in medical status, if patient makes no progress towards goals or if patient is discharged from hospital.  The above assessment, treatment plan, treatment alternatives and goals were discussed and mutually agreed upon: by patient  [place] 09/21/2022, 4:07 PM

## 2022-09-21 NOTE — Progress Notes (Signed)
PROGRESS NOTE   Subjective/Complaints:  Radiation went well; but had dizziness x2 different times when got back- when wa sin bed- no head turning or turning over that triggered it- was stationary.   Also having good BM's 5 good BM's with continence- wants to stop bowel program.      ROS:  Pt denies SOB, abd pain, CP, N/V/C/D, and vision changes   Except per HPI  Objective:   No results found. No results for input(s): "WBC", "HGB", "HCT", "PLT" in the last 72 hours.  No results for input(s): "NA", "K", "CL", "CO2", "GLUCOSE", "BUN", "CREATININE", "CALCIUM" in the last 72 hours.   Intake/Output Summary (Last 24 hours) at 09/21/2022 0859 Last data filed at 09/20/2022 2320 Gross per 24 hour  Intake 240 ml  Output 1220 ml  Net -980 ml        Physical Exam: Vital Signs Blood pressure 119/60, pulse 94, temperature 98 F (36.7 C), resp. rate 16, height 5' 2.99" (1.6 m), weight 41.1 kg, SpO2 98 %.         General: awake, alert, appropriate, sitting up in bed; wearing cervical collar; frail appearing; NAD HENT: conjugate gaze; oropharynx dry-  CV: regular rate in 90's; no JVD Pulmonary: CTA B/L; no W/R/R- good air movement GI: soft, NT, ND, (+)BS Psychiatric: appropriate- more chatty this AM; brighter affect; less anxious Neurological: Ox3  Skin: Posterior neck incision C/D/I- but underneath looks great- no erythema, no increased heat, no drainage- a few scabs Neuro:  Alert and appropriate without dysarthria, speech clear with normal rate.  Bilateral ataxia worse on right.  Altered sensation to light touch primarily in the hands and feet bilaterally.  Cranial nerves II through XII intact, Right upper extremity shoulder abduction 1-2, elbow flexion 4- out of 5, elbow extension 2 out of 5, finger flexion 4-/5 Left upper extremity shoulder abduction 1-2, elbow flexion 4- out of 5, elbow extension 1-2 out of 5, finger  flexion 4-/5 Left lower extremity strength 4 out of 5, right lower extremity strength 4- out of 5 Musculoskeletal: No joint swelling or tenderness noted Left wrist PIV     Assessment/Plan: 1. Functional deficits which require 3+ hours per day of interdisciplinary therapy in a comprehensive inpatient rehab setting. Physiatrist is providing close team supervision and 24 hour management of active medical problems listed below. Physiatrist and rehab team continue to assess barriers to discharge/monitor patient progress toward functional and medical goals  Care Tool:  Bathing    Body parts bathed by patient: Right arm, Left arm, Chest, Abdomen, Front perineal area, Right upper leg, Left upper leg, Right lower leg, Left lower leg, Face   Body parts bathed by helper: Buttocks, Right arm, Left arm, Right lower leg, Left lower leg     Bathing assist Assist Level: Minimal Assistance - Patient > 75%     Upper Body Dressing/Undressing Upper body dressing   What is the patient wearing?: Pull over shirt    Upper body assist Assist Level: Minimal Assistance - Patient > 75%    Lower Body Dressing/Undressing Lower body dressing      What is the patient wearing?: Pants     Lower  body assist Assist for lower body dressing: Contact Guard/Touching assist     Toileting Toileting    Toileting assist Assist for toileting: Maximal Assistance - Patient 25 - 49%     Transfers Chair/bed transfer  Transfers assist     Chair/bed transfer assist level: Moderate Assistance - Patient 50 - 74%     Locomotion Ambulation   Ambulation assist      Assist level: Moderate Assistance - Patient 50 - 74% Assistive device: Walker-rolling Max distance: 60 ft   Walk 10 feet activity   Assist     Assist level: Moderate Assistance - Patient - 50 - 74% Assistive device: Walker-rolling   Walk 50 feet activity   Assist    Assist level: Moderate Assistance - Patient - 50 - 74% Assistive  device: Walker-rolling    Walk 150 feet activity   Assist Walk 150 feet activity did not occur: Safety/medical concerns         Walk 10 feet on uneven surface  activity   Assist Walk 10 feet on uneven surfaces activity did not occur: Safety/medical concerns         Wheelchair     Assist Is the patient using a wheelchair?: Yes Type of Wheelchair: Manual    Wheelchair assist level: Supervision/Verbal cueing Max wheelchair distance: 100 ft    Wheelchair 50 feet with 2 turns activity    Assist        Assist Level: Supervision/Verbal cueing   Wheelchair 150 feet activity     Assist      Assist Level: Minimal Assistance - Patient > 75%   Blood pressure 119/60, pulse 94, temperature 98 F (36.7 C), resp. rate 16, height 5' 2.99" (1.6 m), weight 41.1 kg, SpO2 98 %.  Medical Problem List and Plan: 1. Functional deficits secondary to incomplete nontraumatic quadriplegia due to metastatic breast cancer              -patient may  shower             -ELOS/Goals: 12 to 14 days, PT OT min assist         Con't CIR PT and OT  Set d/c for 1/31 before starts other radiation.   Con't CIR- PT and OT- radiation went well- some dizziness, which is likely a side effect- as well as fatigue.  2.  Antithrombotics: -DVT/anticoagulation:  Pharmaceutical: Lovenox 1/19- the Dopplers are negative              -antiplatelet therapy: N/A 3. Pain Management:  Low dose oxycodone, tylenol 650 every 4 hours as needed and flexeril prn --On gabapentin 100 mg 3 times daily for  neuropathy.  1/18- pain controlled right now- con't regimen and monitor while doing therapy 1/20- al level SCI pain- increased gabapentin to 200 mg TID  1/22- shoulder and hip pain better with muscle relaxants- and nerve pain better with increase in gabapentin- con't regimen and monitor 1/24- changed Oxy to Norco- suggested taking before leaves for radiation 1/25- pain better with Norco 4.  Mood/Behavior/Sleep: LCSW to follow for evaluation and support.             --trazodone prn for insomnia. Continue ativan prn for anxiety.  Continue Celexa for mood.  1/24 - due to increased anxiety, suggested taking Benzo before leaves and will increase Celexa to 40 mg daily             -antipsychotic agents: N/A 5. Neuropsych/cognition: This patient is capable of making  decisions on her own behalf. 6. Skin/Wound Care: Routine pressure relief measures.  7. Fluids/Electrolytes/Nutrition: Monitor I/O. Check CMET in am. 8. Breast cancer with mets to brain/spine (liver/lung?): To start XRT 01/24 per chart review             --abnormal LFTs with A phos-721  1/18- Brain MRI showed lesions 1-2+ mm worse and 2 new lesions- concerning, but starts radiation 1/24 while in rehab  1/19- spoke with SW- will move appointments to afternoon so can participate in therapy first.   1/23- d/w pt- so starts tomorrow   1/25- Did well with radiation- caused some dizziness later on, but went well with Ativan and Norco.              -Followed by Dr. Truitt Merle oncology 9. Hyponatremia: Mild with sodium at 134.  Recheck lytes in am for follow up 10. Leucocytosis: WBC 13.4 on 01/14--> repeat CBC in am.              --Monitor for fevers and other signs of infection.   1/18- WBC down to 9.3- doing better 11. High blood pressure: SBP 140-150 range. Monitor for trends and with activity. .  --Will order orthostatic vitals as dizziness reported with activity 09/07/17- - BP's still [id number] systolic- don't want to add another medicine right now- will wait to add meds.   1/20- don't want to add more meds, esp because SCI pts have a tendency to orthostatic hypotension Vitals:   09/20/22 1951 09/21/22 0458  BP: 127/60 119/60  Pulse: (!) 104 94  Resp: 20 16  Temp: 98.8 F (37.1 C) 98 F (36.7 C)  SpO2: 97% 98%    1/25- BP controlled- con't regimen 12. Neurogenic bowel- on bowel program: Had BM with use of enema and none  for 2-3 days.  --Dose of miralax again today followed by suppository after supper.  -Dulcolax suppository daily, MiraLAX as needed, Senokot daily, sorbitol as needed  1/18- small BM with bowel program last night- con't regimen 1/19- Small BM last night- since soft, won't increase Colace, but will increase Senna to 1 tab BID 1/20- LBM overnight with suppository 1/22- having BM's- with suppository and 1 this AM- good sized, soft and on bedpan.   1/25- will d/c Bowel program and suppository since last 4 days having BM's on her own on BSC/bedpan.  13. Neurogenic bladder: Did have incontinence at admission that's better but has been using purewick and reports that she's getting up wet.              --monitor voiding with PVR/bladder scans. Check UA. 1/18- U/A negative- is voiding some with urinal- will con't PVRs   1/20- voiding with urinal- hasn't needed cathing 1/22- voiding with urinal- asking if can get up to [facility] soon- will d/w therapy 14 hypothyroidism.  Continue Synthroid 75 mcg daily. 15. At level SCI pain- is actually in thoracic area, but is textbook- will increase gabapentin to 200 mg TID and monitor pain issues- explained will never go away, but can get better  1/22- pain is doing a little better with increase in gabapentin 16. GERD  1/22- will add Protonix 40 mg daily for reflux Sx's- Tums didn't work.  17. Dry mouth- due to meds/environment  1/23- will add biotene mouthwash for pt. Is prn   I spent a total of 35   minutes on total care today- >50% coordination of care- due to  D/w nursing as well as pt about dizziness- also  went through chart- has been having good Bms- will d/c bowel program.    LOS: 8 days A FACE TO FACE EVALUATION WAS PERFORMED  Chudney Scheffler 09/21/2022, 8:59 AM

## 2022-09-21 NOTE — Discharge Instructions (Addendum)
Inpatient Rehab Discharge Instructions  Martha [name] Discharge date and time:  09/27/22  Activities/Precautions/ Functional Status: Activity: no lifting, driving, or strenuous exercise till cleared by MD Diet: regular diet Wound Care: keep wound clean and dry   Functional status:  ___ No restrictions     ___ Walk up steps independently _X__ 24/7 supervision/assistance   ___ Walk up steps with assistance ___ Intermittent supervision/assistance  ___ Bathe/dress independently ___ Walk with walker     _X__ Bathe/dress with assistance ___ Walk Independently    ___ Shower independently ___ Walk with assistance    ___ Shower with assistance _X__ No alcohol     ___ Return to work/school ________   COMMUNITY REFERRALS UPON DISCHARGE:    Outpatient: PT      OT                   Agency: [facility] [place]              Appointment Date/Time: *Please expect follow-up within 7-10 business days. If you have not received follow-up, please be sure to contact site directly. *  Medical Equipment/Items Ordered:3in1 bedside commode                                                 Agency/Supplier:[facility] [phone]    Special Instructions: May remove collar when in bed with head supported.    My questions have been answered and I understand these instructions. I will adhere to these goals and the provided educational materials after my discharge from the hospital.  Patient/Caregiver Signature _______________________________ Date __________  Clinician Signature _______________________________________ Date __________  Please bring this form and your medication list with you to all your follow-up doctor's appointments.

## 2022-09-21 NOTE — Progress Notes (Signed)
Occupational Therapy Note  Patient Details  Name: Martha [name] MRN: [id number] Date of Birth: Jul 27, 1961  Today's Date: 09/21/2022 OT Missed Time: 49 Minutes Missed Time Reason: Patient fatigue;Pain  Pt missed 60 mins of group session as pt with increased fatigue, unable to tolerate group session.    Corinne Ports [facility] 09/21/2022, 4:03 PM

## 2022-09-21 NOTE — Progress Notes (Signed)
Physical Therapy Session Note  Patient Details  Name: Martha [name] MRN: [id number] Date of Birth: June 27, 1961  Today's Date: 09/21/2022 PT Individual Time: 1045-1200 PT Individual Time Calculation (min): 75 min   Short Term Goals: Week 1:  PT Short Term Goal 1 (Week 1): Pt will ambulate x 50 ft with assist and LRAD PT Short Term Goal 2 (Week 1): Pt will perform bed mobility with CGA PT Short Term Goal 3 (Week 1): Pt will sit OOB between therapy sessions for improved activity tolerance.  Skilled Therapeutic Interventions/Progress Updates:    pt received in bed and agreeable to therapy. Pt reports bouts of dizziness and pain this AM. Pain has improved, but requesting bed level therapy d/t dizziness.   Pt performed the following exercises to promote core and LE strength and endurance:  -Dead bugs 4 x 10-15- cues for good form, and to relax neck and shoulders for decreased pain -Bridging 4 x 15 -cues to roll through posterior pelvic tilt and thoracic spine for therapeutic motion and to stop short of upper thoracic involvement to protect c-spine -Superset of SLR x 20 and coordination activity tapping target with alternating feet- pt had most difficulty with crossing midline and reaching BLE laterally away from midline but improved with repetition.  -hooklying external rotation against green band, 4 x 20   Therapist also retrieved ultralight wheelchair for trial before w/c eval on 1/26. Provided education and visual demonstration of transfer around foot plate. Pt remained in bed after session and was left with all needs in reach and alarm active.     Therapy Documentation Precautions:  Precautions Precautions: Cervical, Fall Precaution Comments: verbally reviewed and pt able to recall with minimal cueing Required Braces or Orthoses: Cervical Brace Cervical Brace: Hard collar Restrictions Weight Bearing Restrictions: No General:       Therapy/Group: Individual  Therapy  Mickel Fuchs 09/21/2022, 10:59 AM

## 2022-09-21 NOTE — Progress Notes (Addendum)
Patient ID: Martha [name], female   DOB: 03-Sep-1960, 62 y.o.   MRN: [id number]  SW received updates from PT that w/c eval will be tomorrow (1/26) at 1pm with [facility].   1045-SW spoke with pt husband to inform on w/c eval. He will be present. Discussed if he can remain for the remainder of the day. Will stay due to working in [place]. Fam edu 1pm-4pm tomorrow. SW also informed on appointments with cancer center switched back with the exception of one appointment.   1220-SW met with pt in room to inform on above. She was encouraged to have her husband bring in the w/c that was given to her (was her mother's) in case she decides not to get specialty w/c as she states she can get around well in the w/c she has been using. She is remaining positive during this time. No other questions/concerns reported.  Loralee Pacas, MSW, [place] Office: [phone] Cell: [phone] Fax: [phone]

## 2022-09-21 NOTE — Progress Notes (Signed)
Recreational Therapy Session Note  Patient Details  Name: Martha [name] MRN: [id number] Date of Birth: [birth year] Today's Date: 09/21/2022  Pt declined participation in scheduled stress management/coping group due to fatigue and feelings of dizziness.  [place] 09/21/2022, 4:09 PM

## 2022-09-21 NOTE — Progress Notes (Signed)
Occupational Therapy Note  Patient Details  Name: Martha [name] MRN: [id number] Date of Birth: Dec 30, 1960  Today's Date: 09/21/2022 OT Missed Time: 75 Minutes Missed Time Reason: Patient fatigue;Pain;Other (comment) (dizziness)  Pt missed 75 mins skilled OT services. Pt had first radiation tx yesterday and is experiencing increased fatigue, mid-trunk/rib cage pain, and dizziness. Pt commented that she didn't realize radiation would affect her to this extent. WIll check back as schedule allows.    Leotis Shames [facility] 09/21/2022, 9:46 AM

## 2022-09-22 ENCOUNTER — Ambulatory Visit: Payer: BC Managed Care – PPO

## 2022-09-22 NOTE — Progress Notes (Signed)
PROGRESS NOTE   Subjective/Complaints:  Bowels and bladder still going well Required bed level therapy yesterday due to dizziness, - is still there, but better this AM- occurred already with tilting down for using urinal this AM.  LBM yesterday Family education scheduled for today.      ROS:  Pt denies SOB, abd pain, CP, N/V/C/D, and vision changes  Except per HPI  Objective:   No results found. No results for input(s): "WBC", "HGB", "HCT", "PLT" in the last 72 hours.  No results for input(s): "NA", "K", "CL", "CO2", "GLUCOSE", "BUN", "CREATININE", "CALCIUM" in the last 72 hours.   Intake/Output Summary (Last 24 hours) at 09/22/2022 1017 Last data filed at 09/22/2022 0700 Gross per 24 hour  Intake 330 ml  Output 1000 ml  Net -670 ml        Physical Exam: Vital Signs Blood pressure 137/71, pulse 100, temperature 98.2 F (36.8 C), resp. rate 16, height 5' 2.99" (1.6 m), weight 41.1 kg, SpO2 96 %.          General: awake, alert, appropriate, sitting up in bed; NAD HENT: conjugate gaze; oropharynx less dry; wearing cervical collar CV: regular to borderline tachycardic (90-100) rate; no JVD Pulmonary: CTA B/L; no W/R/R- good air movement GI: soft, NT, ND, (+)BS Psychiatric: appropriate- bright Neurological: Ox3  Skin: Posterior neck incision C/D/I- but underneath looks great- no erythema, no increased heat, no drainage- a few scabs Neuro:  Alert and appropriate without dysarthria, speech clear with normal rate.  Bilateral ataxia worse on right.  Altered sensation to light touch primarily in the hands and feet bilaterally.  Cranial nerves II through XII intact, Right upper extremity shoulder abduction 1-2, elbow flexion 4- out of 5, elbow extension 2 out of 5, finger flexion 4-/5 Left upper extremity shoulder abduction 1-2, elbow flexion 4- out of 5, elbow extension 1-2 out of 5, finger flexion 4-/5 Left  lower extremity strength 4 out of 5, right lower extremity strength 4- out of 5 Musculoskeletal: No joint swelling or tenderness noted Left wrist PIV     Assessment/Plan: 1. Functional deficits which require 3+ hours per day of interdisciplinary therapy in a comprehensive inpatient rehab setting. Physiatrist is providing close team supervision and 24 hour management of active medical problems listed below. Physiatrist and rehab team continue to assess barriers to discharge/monitor patient progress toward functional and medical goals  Care Tool:  Bathing    Body parts bathed by patient: Right arm, Left arm, Chest, Abdomen, Front perineal area, Right upper leg, Left upper leg, Right lower leg, Left lower leg, Face   Body parts bathed by helper: Buttocks, Right arm, Left arm, Right lower leg, Left lower leg     Bathing assist Assist Level: Minimal Assistance - Patient > 75%     Upper Body Dressing/Undressing Upper body dressing   What is the patient wearing?: Pull over shirt    Upper body assist Assist Level: Minimal Assistance - Patient > 75%    Lower Body Dressing/Undressing Lower body dressing      What is the patient wearing?: Pants     Lower body assist Assist for lower body dressing: Supervision/Verbal cueing  Toileting Toileting    Toileting assist Assist for toileting: Maximal Assistance - Patient 25 - 49%     Transfers Chair/bed transfer  Transfers assist     Chair/bed transfer assist level: Moderate Assistance - Patient 50 - 74%     Locomotion Ambulation   Ambulation assist      Assist level: Moderate Assistance - Patient 50 - 74% Assistive device: Walker-rolling Max distance: 60 ft   Walk 10 feet activity   Assist     Assist level: Moderate Assistance - Patient - 50 - 74% Assistive device: Walker-rolling   Walk 50 feet activity   Assist    Assist level: Moderate Assistance - Patient - 50 - 74% Assistive device: Walker-rolling     Walk 150 feet activity   Assist Walk 150 feet activity did not occur: Safety/medical concerns         Walk 10 feet on uneven surface  activity   Assist Walk 10 feet on uneven surfaces activity did not occur: Safety/medical concerns         Wheelchair     Assist Is the patient using a wheelchair?: Yes Type of Wheelchair: Manual    Wheelchair assist level: Supervision/Verbal cueing Max wheelchair distance: 100 ft    Wheelchair 50 feet with 2 turns activity    Assist        Assist Level: Supervision/Verbal cueing   Wheelchair 150 feet activity     Assist      Assist Level: Minimal Assistance - Patient > 75%   Blood pressure 137/71, pulse 100, temperature 98.2 F (36.8 C), resp. rate 16, height 5' 2.99" (1.6 m), weight 41.1 kg, SpO2 96 %.  Medical Problem List and Plan: 1. Functional deficits secondary to incomplete nontraumatic quadriplegia due to metastatic breast cancer              -patient may  shower             -ELOS/Goals: 12 to 14 days, PT OT min assist         Con't CIR PT and OT  Set d/c for 1/31 before starts other radiation.   Con't CIR PT and OT- can try to do OOB therapy today- only did bed therapy yesterday due to dizziness- is still there, but better this AM 2.  Antithrombotics: -DVT/anticoagulation:  Pharmaceutical: Lovenox 1/19- the Dopplers are negative              -antiplatelet therapy: N/A 3. Pain Management:  Low dose oxycodone, tylenol 650 every 4 hours as needed and flexeril prn --On gabapentin 100 mg 3 times daily for  neuropathy.  1/18- pain controlled right now- con't regimen and monitor while doing therapy 1/20- al level SCI pain- increased gabapentin to 200 mg TID  1/22- shoulder and hip pain better with muscle relaxants- and nerve pain better with increase in gabapentin- con't regimen and monitor 1/24- changed Oxy to Norco- suggested taking before leaves for radiation 1/25- pain better with Norco 4.  Mood/Behavior/Sleep: LCSW to follow for evaluation and support.             --trazodone prn for insomnia. Continue ativan prn for anxiety.  Continue Celexa for mood.  1/24 - due to increased anxiety, suggested taking Benzo before leaves and will increase Celexa to 40 mg daily             -antipsychotic agents: N/A 5. Neuropsych/cognition: This patient is capable of making decisions on her own behalf. 6. Skin/Wound Care:  Routine pressure relief measures.  7. Fluids/Electrolytes/Nutrition: Monitor I/O. Check CMET in am. 8. Breast cancer with mets to brain/spine (liver/lung?): To start XRT 01/24 per chart review             --abnormal LFTs with A phos-721  1/18- Brain MRI showed lesions 1-2+ mm worse and 2 new lesions- concerning, but starts radiation 1/24 while in rehab  1/19- spoke with SW- will move appointments to afternoon so can participate in therapy first.   1/23- d/w pt- so starts tomorrow   1/25- Did well with radiation- caused some dizziness later on, but went well with Ativan and Norco.   1/26- a little more dizziness this AM- but ready to do therapy today.              -Followed by Dr. Truitt Merle oncology 9. Hyponatremia: Mild with sodium at 134.  Recheck lytes in am for follow up 10. Leucocytosis: WBC 13.4 on 01/14--> repeat CBC in am.              --Monitor for fevers and other signs of infection.   1/18- WBC down to 9.3- doing better 11. High blood pressure: SBP 140-150 range. Monitor for trends and with activity. .  --Will order orthostatic vitals as dizziness reported with activity 09/07/17- - BP's still [id number] systolic- don't want to add another medicine right now- will wait to add meds.   1/20- don't want to add more meds, esp because SCI pts have a tendency to orthostatic hypotension Vitals:   09/21/22 1932 09/22/22 0529  BP: 122/71 137/71  Pulse: 98 100  Resp: 17 16  Temp: 98.5 F (36.9 C) 98.2 F (36.8 C)  SpO2: 99% 96%    1/25- BP controlled- con't regimen 12.  Neurogenic bowel- on bowel program: Had BM with use of enema and none for 2-3 days.  --Dose of miralax again today followed by suppository after supper.  -Dulcolax suppository daily, MiraLAX as needed, Senokot daily, sorbitol as needed  1/18- small BM with bowel program last night- con't regimen 1/19- Small BM last night- since soft, won't increase Colace, but will increase Senna to 1 tab BID 1/20- LBM overnight with suppository 1/22- having BM's- with suppository and 1 this AM- good sized, soft and on bedpan.  1/25- will d/c Bowel program and suppository since last 4 days having BM's on her own on BSC/bedpan.  13. Neurogenic bladder: Did have incontinence at admission that's better but has been using purewick and reports that she's getting up wet.              --monitor voiding with PVR/bladder scans. Check UA. 1/18- U/A negative- is voiding some with urinal- will con't PVRs   1/20- voiding with urinal- hasn't needed cathing 1/22- voiding with urinal- asking if can get up to [facility] soon- will d/w therapy 1/26- doing better 14 hypothyroidism.  Continue Synthroid 75 mcg daily. 15. At level SCI pain- is actually in thoracic area, but is textbook- will increase gabapentin to 200 mg TID and monitor pain issues- explained will never go away, but can get better  1/22- pain is doing a little better with increase in gabapentin 16. GERD  1/22- will add Protonix 40 mg daily for reflux Sx's- Tums didn't work.  17. Dry mouth- due to meds/environment  1/23- will add biotene mouthwash for pt. Is prn  1/26- says it's doing better- con't regimen    LOS: 9 days A FACE TO [place]  Martha [name] 09/22/2022, 10:17 AM

## 2022-09-22 NOTE — Progress Notes (Signed)
Patient ID: Martha [name], female   DOB: [birth year], 62 y.o.   MRN: [id number]  SW met with pt and pt husband check in during family edu. W/c eval cancelled. Will confirm with PT if will be rescheduled. Pt prefers HH, however, can get transportation to outpatient therapies if recommended. SW will confirm all DME needs, so far only 3in1 BSC.   SW ordered 3i1n BSC with [employer] via parachute.   Loralee Pacas, MSW, [place] Office: [phone] Cell: [phone] Fax: [phone]

## 2022-09-22 NOTE — Progress Notes (Signed)
Physical Therapy Session Note  Patient Details  Name: Martha [name] MRN: [id number] Date of Birth: 19-Aug-1961  Today's Date: 09/22/2022 PT Individual Time: 1458-1555 PT Individual Time Calculation (min): 57 min   Short Term Goals: Week 1:  PT Short Term Goal 1 (Week 1): Pt will ambulate x 50 ft with assist and LRAD PT Short Term Goal 2 (Week 1): Pt will perform bed mobility with CGA PT Short Term Goal 3 (Week 1): Pt will sit OOB between therapy sessions for improved activity tolerance.  Skilled Therapeutic Interventions/Progress Updates: Pt presented in bed hand off from OT with husband [name] present. Session focused on hands on family education in preparation for d/c. PTA reviewed pt's current functional status and educated that anticipate that pt will d/c at supervision level although some things are current still CGA. Explained initially up d/c best practice to have someone present. Also provided education on setting up things like Siri/Google assistant so if pt is alone and something happens can use voice commands to call family/emergency. Pt and husband voiced understanding. [name] with some questions regarding pt's decreased coordination with PTA explaining may continue to improve but when fatigued ataxia and tone may appear more prevalent. Pt performed bed mobility with light minA due to pt slipping low in bed. Pt donned shoes with set up and stood with RW and supervision. Pt ambulated ~121f with CGA and husband providing w/c follow to allow DElenore Rotato observe pt ambulating currently. Pt then transferred to w/c and pt was able to propel w/c ~327f Pt was due to have w/c eval today but was rescheduled. DoElenore Rotaill bring in w/c from home so they can compare with ultralight prior to d/c. Pt transported to ortho gym and husband was able to provide guarding as pt ambulated to simulator and pt performed car transfer with supervision. Husband was able to provide appropriate guarding and voiced  pleasure at pt's progress. Pt returned to w/c in same manner and transported to main gym and practiced ascending/descending x 4 steps with 1 rail. Pt does have bilateral rails however is unable to reach both. Pt and PTA ascended/descended x 4 rails using R rail only with step to pattern. Pt noted to ascend better leading with LLE as when attempted with RLE pt's leg almost buckled. Husband was able to guard on second set of stairs with good demo of safety. Pt then transported to day room and ambulated stepping over 2 thresholds with PTA then 2 thresholds with husband for practice of home entry. Pt then ambulated an additional [id number] husband guarding for practice. Pt transported remaining distance back to room and PTA answered additional queries from husband. Pt left in w/c at end of session to allow NT to complete changing sheets. Pt left with husband present and current needs met.      Therapy Documentation Precautions:  Precautions Precautions: Cervical, Fall Precaution Comments: verbally reviewed and pt able to recall with minimal cueing Required Braces or Orthoses: Cervical Brace Cervical Brace: Hard collar Restrictions Weight Bearing Restrictions: No General:   Vital Signs: Therapy Vitals Temp: 98.5 F (36.9 C) Temp Source: Oral Pulse Rate: (!) 102 Resp: 16 BP: 129/76 Patient Position (if appropriate): Lying Oxygen Therapy SpO2: 97 % O2 Device: Room Air Pain:   Mobility:   Locomotion :    Trunk/Postural Assessment :    Balance:   Exercises:   Other Treatments:      Therapy/Group: Individual Therapy  Sulo Janczak 09/22/2022, 4:37 PM

## 2022-09-22 NOTE — Progress Notes (Signed)
Occupational Therapy Session Note  Patient Details  Name: Martha [name] MRN: [id number] Date of Birth: 1961-03-02  Today's Date: 09/22/2022 OT Individual Time: 3893-7342 OT Individual Time Calculation (min): 40 min    Short Term Goals: Week 2:  OT Short Term Goal 1 (Week 2): STG=LTG 2/2 ELOS (continue working towareds supervision LTGs)  Skilled Therapeutic Interventions/Progress Updates:  Skilled OT intervention completed with focus on family education regarding walk in shower transfers and DME education. Pt received upright in bed, agreeable to session. No initial pain reported however with mobility did report pain (un-rated) in back; pre-medicated. OT offered rest breaks and repositioning throughout for pain reduction.  Pt's husband present for education. Pt and husband reported having a walk in shower with small step to enter with shower curtain. Per primary OT who has practiced step in transfer before, recommended tub bench for transfer to minimize fall risk and decrease caregiver burden. Education provided about tub bench, installation with suction cup legs/hand rail/back rest inside the shower, 2 legs on outside for sit pivot method. Discussed shower set up, with cervical collar being the last thing to remove in prep for shower, with bandages covered. Suction cup wall mount for hand held shower head recommended to increase independence and OT encouraged lateral leans/seated bathing only to minimize fall risk and promote independence with bathing as well as due to collar being off. Discussed shower curtain tuck method under butt for prevention of water spillage. Handout issued for TTB and wall mount with husband reporting a friend has a tub bench and BSC she can use with plans to borrow those vs buying.  Pt was able to transition to EOB with time but supervision, donned shoes with set up A. CGA sit > stand using RW, then short ambulatory transfer with RW and CGA to tub bench set up with  shower frame for simulated shower. Able to sit pivot with min cues for technique, but supervision assist. Supervision pivot to edge of bench then CGA sit > stand and ambulatory transfer back to EOB. OT encouraged pt's husband to practice hands on assist with ambulating to bench in prep for DC however pt reported increase in back pain with request to lay down as PT was coming for ambulation practice right after this OT. However husband reports that he was walking pt PTA at a heavier assist, and feels comfortable with supervision/CGA level with shower transfers. Did suggest that if pt conserve energy on "bad days" I.e. with radiation or general fatigue to either plan for shower a different day or to modify transfer with stand pivot vs walking for safety.  Pt remained upright in bed, with next PT present at direct care handoff.   Therapy Documentation Precautions:  Precautions Precautions: Cervical, Fall Precaution Comments: verbally reviewed and pt able to recall with minimal cueing Required Braces or Orthoses: Cervical Brace Cervical Brace: Hard collar Restrictions Weight Bearing Restrictions: No    Therapy/Group: Individual Therapy  Blase Mess, MS, OTR/L  09/22/2022, 3:21 PM

## 2022-09-22 NOTE — Progress Notes (Signed)
Occupational Therapy Session Note  Patient Details  Name: Xiara Knisley MRN: [id number] Date of Birth: [birth year]  Today's Date: 09/22/2022 OT Individual Time: 0973-5329 OT Individual Time Calculation (min): 45 min    Short Term Goals: Week 2:  OT Short Term Goal 1 (Week 2): STG=LTG 2/2 ELOS (continue working towareds supervision LTGs)  Skilled Therapeutic Interventions/Progress Updates:    Pt resting in bed upon arrival. Pt reports she feels much better then previous day. Initial intervention with focus on bed mobility, functional amb with RW, standing balance, BADLs, and shower transfers to increase pt's independenve with bADLs. Pt amb with RW to sink and stood at sink to brush teeth. Pt leaned against sink when brushing teeth but didn't required any additional assistance. Pt requires min A for UB dressing tasks with pullover shirt. LB dressing and socks/shoes with supervision. Pt transitioned to day room and preacticed walk in shower transfers. Pt requires mod A for balance when lifting LLE over edge of shower frame. Recommended that pt try TTB in later session. Pt returned to room and remained in w/c. All needs within reach.   Therapy Documentation Precautions:  Precautions Precautions: Cervical, Fall Precaution Comments: verbally reviewed and pt able to recall with minimal cueing Required Braces or Orthoses: Cervical Brace Cervical Brace: Hard collar Restrictions Weight Bearing Restrictions: No Pain: Pt reports her pain "isn't bad" this morning  Therapy/Group: Individual Therapy  Leroy Libman 09/22/2022, 8:50 AM

## 2022-09-23 DIAGNOSIS — I10 Essential (primary) hypertension: Secondary | ICD-10-CM

## 2022-09-23 NOTE — Progress Notes (Signed)
Occupational Therapy Session Note  Patient Details  Name: Martha [name] MRN: [id number] Date of Birth: September 05, 1960  Today's Date: 09/23/2022 OT Individual Time: 0900-0930 OT Individual Time Calculation (min): 30 min    Short Term Goals: Week 2:  OT Short Term Goal 1 (Week 2): STG=LTG 2/2 ELOS (continue working towards supervision LTGs)  Skilled Therapeutic Interventions/Progress Updates:    Pt received in bed ready for therapy.  She did well sitting to EOB but felt hot.  Used fan to cool herself. Also suggested cold cloth or ice to ventral wrists when she is feeling hot.  Pt's spouse has arrived and brought in wc from home.   Suggested to [name] her spouse that he practice hands on with her.  He did extremely well with providing her guarding A as she stood up with S, ambulated to bathroom with light CGA with RW, toileted with CGA. Pt tends to release both hands to manage clothing but has increased posterior lean.  Recommended pt use alternating hands on RW for increased stability.  Pt sat in wc from home. It has a fairly good fit but does not have leg rests. Placed hospital's leg rests on her chair.  Pt then opted to go back to bed due to headache. Her spouse then assisted her for CGA with ambulation back to bed. Pt moved to supine without A.  Pt resting in bed with all needs met.   Therapy Documentation Precautions:  Precautions Precautions: Cervical, Fall Precaution Comments: verbally reviewed and pt able to recall with minimal cueing Required Braces or Orthoses: Cervical Brace Cervical Brace: Hard collar Restrictions Weight Bearing Restrictions: No     Pain: Pain Assessment Pain Scale: 0-10 Pain Score: 1  Faces Pain Scale: No hurt Pain Type: Acute pain Pain Location: Shoulder Pain Orientation: Right Pain Descriptors / Indicators: Dull Pain Frequency: Intermittent Pain Intervention(s): Medication (See eMAR) PAINAD (Pain Assessment in Advanced Dementia) Breathing:  normal Negative Vocalization: none Facial Expression: smiling or inexpressive Body Language: relaxed Consolability: no need to console PAINAD Score: 0     Therapy/Group: Individual Therapy  Punta Rassa 09/23/2022, 12:19 PM

## 2022-09-23 NOTE — Radiation Completion Notes (Signed)
Patient Name: Martha [name], Martha [name] MRN: [id number] Date of Birth: 1961/02/16 Referring Physician: Dimas Chyle, M.D. Date of Service: 2022-09-23 Radiation Oncologist: Eppie Gibson, M.D. [facility] END OF TREATMENT NOTE     Diagnosis: C79.51 Secondary malignant neoplasm of bone; C79.31 Secondary malignant neoplasm of brain Staging on 2018-04-09: Malignant neoplasm of lower-inner quadrant of left breast in female, estrogen receptor positive ([place]) T=pT1c, N=pN1, M=cM0 Staging on 2018-01-16: Malignant neoplasm of lower-inner quadrant of left breast in female, estrogen receptor positive ([name]) T=cT1c, N=cN0, M=cM0 Intent: Palliative     ==========DELIVERED PLANS==========  First Treatment Date: 2022-09-20 - Last Treatment Date: 2022-09-20   Plan Name: Brain_SRS Site: Brain Technique: SBRT/SRT-IMRT Mode: Photon Dose Per Fraction: 20 Gy Prescribed Dose (Delivered / Prescribed): 20 Gy / 20 Gy Prescribed Fxs (Delivered / Prescribed): 1 / 1     ==========ON TREATMENT VISIT DATES========== 2022-09-20     ==========UPCOMING VISITS==========       ==========APPENDIX - ON TREATMENT VISIT NOTES==========   See weekly On Treatment Notes is Epic for details.

## 2022-09-23 NOTE — Progress Notes (Signed)
PROGRESS NOTE   Subjective/Complaints:  Overall doing well. Asked when dressing could come off surgical site. Pt pleased with therapy progress  ROS: Patient denies fever, rash, sore throat, blurred vision, dizziness, nausea, vomiting, diarrhea, cough, shortness of breath or chest pain,  headache, or mood change.    Objective:   No results found. No results for input(s): "WBC", "HGB", "HCT", "PLT" in the last 72 hours.  No results for input(s): "NA", "K", "CL", "CO2", "GLUCOSE", "BUN", "CREATININE", "CALCIUM" in the last 72 hours.   Intake/Output Summary (Last 24 hours) at 09/23/2022 1228 Last data filed at 09/23/2022 0753 Gross per 24 hour  Intake 360 ml  Output 772 ml  Net -412 ml        Physical Exam: Vital Signs Blood pressure 133/69, pulse 94, temperature 98.1 F (36.7 C), temperature source Oral, resp. rate 18, height 5' 2.99" (1.6 m), weight 41.1 kg, SpO2 98 %.          Constitutional: No distress . Vital signs reviewed. HEENT: NCAT, EOMI, oral membranes moist Neck: cervical collar fitting appropriately. Cardiovascular: RRR without murmur. No JVD    Respiratory/Chest: CTA Bilaterally without wheezes or rales. Normal effort    GI/Abdomen: BS +, non-tender, non-distended Ext: no clubbing, cyanosis, or edema Psych: pleasant and cooperative  Skin: Posterior neck incision C/D/I- dressing removed, minimal debris. Neuro:  Alert and appropriate without dysarthria, speech clear with normal rate.  Bilateral ataxia worse on right.  Altered sensation to light touch primarily in the hands and feet bilaterally.  Cranial nerves II through XII intact, Right upper extremity shoulder abduction 1-2, elbow flexion 4- out of 5, elbow extension 2 out of 5, finger flexion 4-/5 Left upper extremity shoulder abduction 1-2, elbow flexion 4- out of 5, elbow extension 1-2 out of 5, finger flexion 4-/5 Left lower extremity strength  4 out of 5, right lower extremity strength 4- out of 5 Musculoskeletal: No joint swelling or tenderness noted. No pain with rom.      Assessment/Plan: 1. Functional deficits which require 3+ hours per day of interdisciplinary therapy in a comprehensive inpatient rehab setting. Physiatrist is providing close team supervision and 24 hour management of active medical problems listed below. Physiatrist and rehab team continue to assess barriers to discharge/monitor patient progress toward functional and medical goals  Care Tool:  Bathing    Body parts bathed by patient: Right arm, Left arm, Chest, Abdomen, Front perineal area, Right upper leg, Left upper leg, Right lower leg, Left lower leg, Face   Body parts bathed by helper: Buttocks, Right arm, Left arm, Right lower leg, Left lower leg     Bathing assist Assist Level: Minimal Assistance - Patient > 75%     Upper Body Dressing/Undressing Upper body dressing   What is the patient wearing?: Pull over shirt    Upper body assist Assist Level: Minimal Assistance - Patient > 75%    Lower Body Dressing/Undressing Lower body dressing      What is the patient wearing?: Pants     Lower body assist Assist for lower body dressing: Supervision/Verbal cueing     Toileting Toileting    Toileting assist Assist for toileting:  Maximal Assistance - Patient 25 - 49%     Transfers Chair/bed transfer  Transfers assist     Chair/bed transfer assist level: Moderate Assistance - Patient 50 - 74%     Locomotion Ambulation   Ambulation assist      Assist level: Moderate Assistance - Patient 50 - 74% Assistive device: Walker-rolling Max distance: 60 ft   Walk 10 feet activity   Assist     Assist level: Moderate Assistance - Patient - 50 - 74% Assistive device: Walker-rolling   Walk 50 feet activity   Assist    Assist level: Moderate Assistance - Patient - 50 - 74% Assistive device: Walker-rolling    Walk 150 feet  activity   Assist Walk 150 feet activity did not occur: Safety/medical concerns         Walk 10 feet on uneven surface  activity   Assist Walk 10 feet on uneven surfaces activity did not occur: Safety/medical concerns         Wheelchair     Assist Is the patient using a wheelchair?: Yes Type of Wheelchair: Manual    Wheelchair assist level: Supervision/Verbal cueing Max wheelchair distance: 100 ft    Wheelchair 50 feet with 2 turns activity    Assist        Assist Level: Supervision/Verbal cueing   Wheelchair 150 feet activity     Assist      Assist Level: Minimal Assistance - Patient > 75%   Blood pressure 133/69, pulse 94, temperature 98.1 F (36.7 C), temperature source Oral, resp. rate 18, height 5' 2.99" (1.6 m), weight 41.1 kg, SpO2 98 %.  Medical Problem List and Plan: 1. Functional deficits secondary to incomplete nontraumatic quadriplegia due to metastatic breast cancer              -patient may  shower             -ELOS/Goals: 12 to 14 days, PT OT min assist         Con't CIR PT and OT  Set d/c for 1/31 before starts other radiation.   -Continue CIR therapies including PT, OT. Able to sit eob with OT this morning. But limited by feeling hot, headache. 2.  Antithrombotics: -DVT/anticoagulation:  Pharmaceutical: Lovenox 1/19- the Dopplers are negative              -antiplatelet therapy: N/A 3. Pain Management:  Low dose oxycodone, tylenol 650 every 4 hours as needed and flexeril prn --On gabapentin 100 mg 3 times daily for  neuropathy.  1/18- pain controlled right now- con't regimen and monitor while doing therapy 1/20- al level SCI pain- increased gabapentin to 200 mg TID  1/22- shoulder and hip pain better with muscle relaxants- and nerve pain better with increase in gabapentin- con't regimen and monitor 1/24- changed Oxy to Norco- suggested taking before leaves for radiation 1/25- pain better with Norco 1/27 pain reasonably  controlled 4. Mood/Behavior/Sleep: LCSW to follow for evaluation and support.             --trazodone prn for insomnia. Continue ativan prn for anxiety.  Continue Celexa for mood.  1/24 - due to increased anxiety, suggested taking Benzo before leaves and will increase Celexa to 40 mg daily             -antipsychotic agents: N/A 5. Neuropsych/cognition: This patient is capable of making decisions on her own behalf. 6. Skin/Wound Care: removed operative dressing today. No further dressing needed. 7. Fluids/Electrolytes/Nutrition:  Monitor I/O. Check CMET in am. 8. Breast cancer with mets to brain/spine (liver/lung?): To start XRT 01/24 per chart review             --abnormal LFTs with A phos-721  1/18- Brain MRI showed lesions 1-2+ mm worse and 2 new lesions- concerning, but starts radiation 1/24 while in rehab  1/19- spoke with SW- will move appointments to afternoon so can participate in therapy first.   1/23- d/w pt- so starts tomorrow   1/25- Did well with radiation- caused some dizziness later on, but went well with Ativan and Norco.   1/27 some dizziness with therapy today. Pt trying to work through             -Followed by Dr. Truitt Merle oncology 9. Hyponatremia: Mild with sodium at 134.  Recheck lytes in am for follow up 10. Leucocytosis: WBC 13.4 on 01/14--> repeat CBC in am.              --Monitor for fevers and other signs of infection.   1/18- WBC down to 9.3- doing better 11. High blood pressure: SBP 140-150 range. Monitor for trends and with activity. .  --Will order orthostatic vitals as dizziness reported with activity 09/07/17- - BP's still [id number] systolic- don't want to add another medicine right now- will wait to add meds.   1/20- don't want to add more meds, esp because SCI pts have a tendency to orthostatic hypotension Vitals:   09/22/22 2022 09/23/22 0602  BP: 130/75 133/69  Pulse: 94 94  Resp: 18 18  Temp: 98.1 F (36.7 C) 98.1 F (36.7 C)  SpO2: 96% 98%    1/27-  BP controlled- con't regimen 12. Neurogenic bowel- on bowel program: Had BM with use of enema and none for 2-3 days.  --Dose of miralax again today followed by suppository after supper.  -Dulcolax suppository daily, MiraLAX as needed, Senokot daily, sorbitol as needed  1/18- small BM with bowel program last night- con't regimen 1/19- Small BM last night- since soft, won't increase Colace, but will increase Senna to 1 tab BID 1/20- LBM overnight with suppository 1/22- having BM's- with suppository and 1 this AM- good sized, soft and on bedpan.  1/25- will d/c Bowel program and suppository since last 4 days having BM's on her own on BSC/bedpan.  13. Neurogenic bladder: Did have incontinence at admission that's better but has been using purewick and reports that she's getting up wet.              --monitor voiding with PVR/bladder scans. Check UA. 1/18- U/A negative- is voiding some with urinal- will con't PVRs   1/20- voiding with urinal- hasn't needed cathing 1/22- voiding with urinal- asking if can get up to [facility] soon- will d/w therapy 1/26-27- doing better 14 hypothyroidism.  Continue Synthroid 75 mcg daily. 15. At level SCI pain- is actually in thoracic area, but is textbook- will increase gabapentin to 200 mg TID and monitor pain issues- explained will never go away, but can get better  1/22- pain is doing a little better with increase in gabapentin 16. GERD  1/22- will add Protonix 40 mg daily for reflux Sx's- Tums didn't work.  17. Dry mouth- due to meds/environment  1/23- will add biotene mouthwash for pt. Is prn  1/26- says it's doing better- con't regimen    LOS: 10 days A FACE TO FACE EVALUATION WAS PERFORMED  Meredith Staggers 09/23/2022, 12:28 PM

## 2022-09-24 NOTE — Progress Notes (Signed)
PROGRESS NOTE   Subjective/Complaints:  Says that family ed went well yesterday. No complaints this morning. Working on a bm when I initially came into see her  ROS: Patient denies fever, rash, sore throat, blurred vision, dizziness, nausea, vomiting, diarrhea, cough, shortness of breath or chest pain,   headache, or mood change.    Objective:   No results found. No results for input(s): "WBC", "HGB", "HCT", "PLT" in the last 72 hours.  No results for input(s): "NA", "K", "CL", "CO2", "GLUCOSE", "BUN", "CREATININE", "CALCIUM" in the last 72 hours.   Intake/Output Summary (Last 24 hours) at 09/24/2022 1016 Last data filed at 09/24/2022 0729 Gross per 24 hour  Intake 660 ml  Output 1226 ml  Net -566 ml        Physical Exam: Vital Signs Blood pressure (!) 147/71, pulse 91, temperature 97.9 F (36.6 C), resp. rate 18, height 5' 2.99" (1.6 m), weight 41.1 kg, SpO2 97 %.          Constitutional: No distress . Vital signs reviewed. HEENT: NCAT, EOMI, oral membranes moist Neck: supple Cardiovascular: RRR without murmur. No JVD    Respiratory/Chest: CTA Bilaterally without wheezes or rales. Normal effort    GI/Abdomen: BS +, non-tender, non-distended Ext: no clubbing, cyanosis, or edema Psych: pleasant and cooperative   Skin: Posterior neck incision C/D/I. Neuro:  Alert and appropriate without dysarthria, speech clear with normal rate.  Bilateral ataxia worse on right.  Altered sensation to light touch primarily in the hands and feet bilaterally.  Cranial nerves II through XII intact, Right upper extremity shoulder abduction 1-2, elbow flexion 4- out of 5, elbow extension 2 out of 5, finger flexion 4-/5 Left upper extremity shoulder abduction 1-2, elbow flexion 4- out of 5, elbow extension 1-2 out of 5, finger flexion 4-/5 Left lower extremity strength 4 out of 5, right lower extremity strength 4- out of  5 Musculoskeletal: No joint swelling or tenderness noted. In cervical collar      Assessment/Plan: 1. Functional deficits which require 3+ hours per day of interdisciplinary therapy in a comprehensive inpatient rehab setting. Physiatrist is providing close team supervision and 24 hour management of active medical problems listed below. Physiatrist and rehab team continue to assess barriers to discharge/monitor patient progress toward functional and medical goals  Care Tool:  Bathing    Body parts bathed by patient: Right arm, Left arm, Chest, Abdomen, Front perineal area, Right upper leg, Left upper leg, Right lower leg, Left lower leg, Face   Body parts bathed by helper: Buttocks, Right arm, Left arm, Right lower leg, Left lower leg     Bathing assist Assist Level: Minimal Assistance - Patient > 75%     Upper Body Dressing/Undressing Upper body dressing   What is the patient wearing?: Pull over shirt    Upper body assist Assist Level: Minimal Assistance - Patient > 75%    Lower Body Dressing/Undressing Lower body dressing      What is the patient wearing?: Pants     Lower body assist Assist for lower body dressing: Supervision/Verbal cueing     Toileting Toileting    Toileting assist Assist for toileting: Maximal Assistance -  Patient 25 - 49%     Transfers Chair/bed transfer  Transfers assist     Chair/bed transfer assist level: Moderate Assistance - Patient 50 - 74%     Locomotion Ambulation   Ambulation assist      Assist level: Moderate Assistance - Patient 50 - 74% Assistive device: Walker-rolling Max distance: 60 ft   Walk 10 feet activity   Assist     Assist level: Moderate Assistance - Patient - 50 - 74% Assistive device: Walker-rolling   Walk 50 feet activity   Assist    Assist level: Moderate Assistance - Patient - 50 - 74% Assistive device: Walker-rolling    Walk 150 feet activity   Assist Walk 150 feet activity did not  occur: Safety/medical concerns         Walk 10 feet on uneven surface  activity   Assist Walk 10 feet on uneven surfaces activity did not occur: Safety/medical concerns         Wheelchair     Assist Is the patient using a wheelchair?: Yes Type of Wheelchair: Manual    Wheelchair assist level: Supervision/Verbal cueing Max wheelchair distance: 100 ft    Wheelchair 50 feet with 2 turns activity    Assist        Assist Level: Supervision/Verbal cueing   Wheelchair 150 feet activity     Assist      Assist Level: Minimal Assistance - Patient > 75%   Blood pressure (!) 147/71, pulse 91, temperature 97.9 F (36.6 C), resp. rate 18, height 5' 2.99" (1.6 m), weight 41.1 kg, SpO2 97 %.  Medical Problem List and Plan: 1. Functional deficits secondary to incomplete nontraumatic quadriplegia due to metastatic breast cancer              -patient may  shower             -ELOS/Goals: 12 to 14 days, PT OT min assist         Con't CIR PT and OT  Set d/c for 1/31 before starts other radiation.   -Continue CIR therapies including PT, OT, family ed went well 2.  Antithrombotics: -DVT/anticoagulation:  Pharmaceutical: Lovenox 1/19- the Dopplers are negative              -antiplatelet therapy: N/A 3. Pain Management:  Low dose oxycodone, tylenol 650 every 4 hours as needed and flexeril prn --On gabapentin 100 mg 3 times daily for  neuropathy.  1/18- pain controlled right now- con't regimen and monitor while doing therapy 1/20- al level SCI pain- increased gabapentin to 200 mg TID  1/22- shoulder and hip pain better with muscle relaxants- and nerve pain better with increase in gabapentin- con't regimen and monitor 1/24- changed Oxy to Norco- suggested taking before leaves for radiation 1/25- pain better with Norco 1/27 pain reasonably controlled 4. Mood/Behavior/Sleep: LCSW to follow for evaluation and support.             --trazodone prn for insomnia. Continue  ativan prn for anxiety.  Continue Celexa for mood.  1/24 - due to increased anxiety, suggested taking Benzo before leaves and will increase Celexa to 40 mg daily             -antipsychotic agents: N/A 5. Neuropsych/cognition: This patient is capable of making decisions on her own behalf. 6. Skin/Wound Care: removed operative dressing today. No further dressing needed. 7. Fluids/Electrolytes/Nutrition: Monitor I/O. Check CMET in am. 8. Breast cancer with mets to brain/spine (liver/lung?): To  start XRT 01/24 per chart review             --abnormal LFTs with A phos-721  1/18- Brain MRI showed lesions 1-2+ mm worse and 2 new lesions- concerning, but starts radiation 1/24 while in rehab  1/19- spoke with SW- will move appointments to afternoon so can participate in therapy first.   1/23- d/w pt- so starts tomorrow   1/25- Did well with radiation- caused some dizziness later on, but went well with Ativan and Norco.   1/28 still limited in activity tolerance at times d/t dizziness/headaches             -Followed by Dr. Truitt Merle oncology 9. Hyponatremia: Mild with sodium at 134.  Recheck lytes in am for follow up 10. Leucocytosis: WBC 13.4 on 01/14--> repeat CBC in am.              --Monitor for fevers and other signs of infection.   1/18- WBC down to 9.3- doing better 11. High blood pressure: SBP 140-150 range. Monitor for trends and with activity. .  --Will order orthostatic vitals as dizziness reported with activity 09/07/17- - BP's still [id number] systolic- don't want to add another medicine right now- will wait to add meds.   1/20- don't want to add more meds, esp because SCI pts have a tendency to orthostatic hypotension Vitals:   09/23/22 2027 09/24/22 0349  BP: 139/65 (!) 147/71  Pulse: 94 91  Resp: 20 18  Temp: 98.5 F (36.9 C) 97.9 F (36.6 C)  SpO2: 95% 97%    1/27- BP controlled- con't regimen 12. Neurogenic bowel- on bowel program: Had BM with use of enema and none for 2-3 days.   --Dose of miralax again today followed by suppository after supper.  -Dulcolax suppository daily, MiraLAX as needed, Senokot daily, sorbitol as needed  1/18- small BM with bowel program last night- con't regimen 1/19- Small BM last night- since soft, won't increase Colace, but will increase Senna to 1 tab BID 1/20- LBM overnight with suppository 1/22- having BM's- with suppository and 1 this AM- good sized, soft and on bedpan.  1/28 continues to move bowels on her own 13. Neurogenic bladder: Did have incontinence at admission that's better but has been using purewick and reports that she's getting up wet.              --monitor voiding with PVR/bladder scans. Check UA. 1/18- U/A negative- is voiding some with urinal- will con't PVRs   1/20- voiding with urinal- hasn't needed cathing 1/22- voiding with urinal- asking if can get up to [facility] soon- will d/w therapy 1/28 improved emptying 14 hypothyroidism.  Continue Synthroid 75 mcg daily. 15. At level SCI pain- is actually in thoracic area, but is textbook- will increase gabapentin to 200 mg TID and monitor pain issues- explained will never go away, but can get better  1/22- pain is doing a little better with increase in gabapentin 16. GERD  1/22- will add Protonix 40 mg daily for reflux Sx's- Tums didn't work.  17. Dry mouth- due to meds/environment  1/23- will add biotene mouthwash for pt. Is prn  1/26- says it's doing better- con't regimen    LOS: 11 days A FACE TO FACE EVALUATION WAS PERFORMED  Meredith Staggers 09/24/2022, 10:16 AM

## 2022-09-24 NOTE — Progress Notes (Addendum)
Occupational Therapy Session Note  Patient Details  Name: Martha [name] MRN: [id number] Date of Birth: 05/12/1961  Today's Date: 09/24/2022 OT Individual Time: 1420-1515 OT Individual Time Calculation (min): 55 min  Today's Date: 09/24/2022 OT Missed Time: 15 Minutes Missed Time Reason: Patient fatigue   Short Term Goals: Week 2:  OT Short Term Goal 1 (Week 2): STG=LTG 2/2 ELOS (continue working towareds supervision LTGs)  Skilled Therapeutic Interventions/Progress Updates:  Pt received resting in bed with husband present for skilled OT session with focus on BADL retraining. Pt agreeable to interventions, demonstrating overall pleasant mood. Pt with un-rated pain. OT offering intermediate rest breaks and positioning suggestions throughout session to address pain/fatigue and maximize participation/safety in session.   Pt performs all functional transfers with CGA-SUP + RW, requiring Min A+RW for initial STS. Pt completes room-level ambulation to complete shower with close SUP + RW. Pt doffs/dons UB garments with Min A for fabric management over head, unthreading/threading pants with increased time and doffing/donning in standing with CGA + RW. Pt completes full-body shower, requiring A to thoroughly wash lower legs, feet, and buttocks.   Pt issued long-handled sponge and bath mit for increased independence during times of decreased sensation in hands. Pt educated on importance of safe/slow pacing for energy conservation and improved functioning.  Pt remained resting in bed with all immediate needs met at end of session. Pt continues to be appropriate for skilled OT intervention to promote further functional independence.   Therapy Documentation Precautions:  Precautions Precautions: Cervical, Fall Precaution Comments: verbally reviewed and pt able to recall with minimal cueing Required Braces or Orthoses: Cervical Brace Cervical Brace: Hard collar Restrictions Weight Bearing  Restrictions: No   Therapy/Group: Individual Therapy  Maudie Mercury, OTR/L, MSOT  09/24/2022, 6:13 AM

## 2022-09-25 LAB — CBC
HCT: 38 % (ref 36.0–46.0)
Hemoglobin: 13.3 g/dL (ref 12.0–15.0)
MCH: 32 pg (ref 26.0–34.0)
MCHC: 35 g/dL (ref 30.0–36.0)
MCV: 91.3 fL (ref 80.0–100.0)
Platelets: 242 10*3/uL (ref 150–400)
RBC: 4.16 MIL/uL (ref 3.87–5.11)
RDW: 13.3 % (ref 11.5–15.5)
WBC: 6.1 10*3/uL (ref 4.0–10.5)
nRBC: 0 % (ref 0.0–0.2)

## 2022-09-25 MED ORDER — LORAZEPAM 0.5 MG PO TABS: 0.5000 mg | ORAL_TABLET | Freq: Every day | ORAL | Status: DC | PRN

## 2022-09-25 NOTE — Progress Notes (Signed)
Occupational Therapy Session Note  Patient Details  Name: Martha [name] MRN: [id number] Date of Birth: August 25, 1961  Today's Date: 09/25/2022 OT Co-Treatment Time: 6629-4765 OT Co-Treatment Time Calculation (min): 15 min   Short Term Goals: Week 2:  OT Short Term Goal 1 (Week 2): STG=LTG 2/2 ELOS (continue working towareds supervision LTGs)  Skilled Therapeutic Interventions/Progress Updates:    Cotx with PT for w/c eval. Pt presented with w/c options and trialed light weight w/c. Pt currently has w/c (her Mom's) she can use at home and unsure if she wants a light weight w/c. ATP Jason with [employer] present and took measurements. Pt aware she doesn't need to make decision at this time. Pt remained with PT to complete w/c eval.   Therapy Documentation Precautions:  Precautions Precautions: Cervical, Fall Precaution Comments: verbally reviewed and pt able to recall with minimal cueing Required Braces or Orthoses: Cervical Brace Cervical Brace: Hard collar Restrictions Weight Bearing Restrictions: No   Pain:  Pt denies pain this morning     Therapy/Group: Co-Treatment  Leroy Libman 09/25/2022, 11:57 AM

## 2022-09-25 NOTE — Progress Notes (Signed)
PROGRESS NOTE   Subjective/Complaints:  Pt reports family education done this AM.  Got shower Sunday- feeling better.   Went to bathroom with RW/nurse  Poor appetite still- but taking supplements.  LBM x2 yesterday Slept really well.    ROS:  Pt denies SOB, abd pain, CP, N/V/C/D, and vision changes   Objective:   No results found. Recent Labs    09/25/22 0607  WBC 6.1  HGB 13.3  HCT 38.0  PLT 242    No results for input(s): "NA", "K", "CL", "CO2", "GLUCOSE", "BUN", "CREATININE", "CALCIUM" in the last 72 hours.   Intake/Output Summary (Last 24 hours) at 09/25/2022 0901 Last data filed at 09/24/2022 2140 Gross per 24 hour  Intake 780 ml  Output 500 ml  Net 280 ml        Physical Exam: Vital Signs Blood pressure 139/65, pulse 93, temperature 98.8 F (37.1 C), temperature source Oral, resp. rate 16, height 5' 2.99" (1.6 m), weight 41.1 kg, SpO2 97 %.           General: awake, alert, appropriate,sitting up in bed; nurse in room;  NAD HENT: conjugate gaze; oropharynx dry- wearing cervical collar CV: regular rate- rate in 90's; no JVD Pulmonary: CTA B/L; no W/R/R- good air movement GI: soft, NT, ND, (+)BS- normoactive Psychiatric: appropriate- bright affect Neurological: Ox3  Ext: no clubbing, cyanosis, or edema Psych: pleasant and cooperative   Skin: Posterior neck incision C/D/I. Neuro:  Alert and appropriate without dysarthria, speech clear with normal rate.  Bilateral ataxia worse on right.  Altered sensation to light touch primarily in the hands and feet bilaterally.  Cranial nerves II through XII intact, Right upper extremity shoulder abduction 1-2, elbow flexion 4- out of 5, elbow extension 2 out of 5, finger flexion 4-/5 Left upper extremity shoulder abduction 1-2, elbow flexion 4- out of 5, elbow extension 1-2 out of 5, finger flexion 4-/5 Left lower extremity strength 4 out of 5, right  lower extremity strength 4- out of 5 Musculoskeletal: No joint swelling or tenderness noted. In cervical collar      Assessment/Plan: 1. Functional deficits which require 3+ hours per day of interdisciplinary therapy in a comprehensive inpatient rehab setting. Physiatrist is providing close team supervision and 24 hour management of active medical problems listed below. Physiatrist and rehab team continue to assess barriers to discharge/monitor patient progress toward functional and medical goals  Care Tool:  Bathing    Body parts bathed by patient: Right arm, Left arm, Chest, Abdomen, Front perineal area, Right upper leg, Left upper leg, Right lower leg, Left lower leg, Face   Body parts bathed by helper: Buttocks, Right arm, Left arm, Right lower leg, Left lower leg     Bathing assist Assist Level: Minimal Assistance - Patient > 75%     Upper Body Dressing/Undressing Upper body dressing   What is the patient wearing?: Pull over shirt    Upper body assist Assist Level: Minimal Assistance - Patient > 75%    Lower Body Dressing/Undressing Lower body dressing      What is the patient wearing?: Pants     Lower body assist Assist for lower body dressing:  Supervision/Verbal cueing     Toileting Toileting    Toileting assist Assist for toileting: Maximal Assistance - Patient 25 - 49%     Transfers Chair/bed transfer  Transfers assist     Chair/bed transfer assist level: Moderate Assistance - Patient 50 - 74%     Locomotion Ambulation   Ambulation assist      Assist level: Moderate Assistance - Patient 50 - 74% Assistive device: Walker-rolling Max distance: 60 ft   Walk 10 feet activity   Assist     Assist level: Moderate Assistance - Patient - 50 - 74% Assistive device: Walker-rolling   Walk 50 feet activity   Assist    Assist level: Moderate Assistance - Patient - 50 - 74% Assistive device: Walker-rolling    Walk 150 feet  activity   Assist Walk 150 feet activity did not occur: Safety/medical concerns         Walk 10 feet on uneven surface  activity   Assist Walk 10 feet on uneven surfaces activity did not occur: Safety/medical concerns         Wheelchair     Assist Is the patient using a wheelchair?: Yes Type of Wheelchair: Manual    Wheelchair assist level: Supervision/Verbal cueing Max wheelchair distance: 100 ft    Wheelchair 50 feet with 2 turns activity    Assist        Assist Level: Supervision/Verbal cueing   Wheelchair 150 feet activity     Assist      Assist Level: Minimal Assistance - Patient > 75%   Blood pressure 139/65, pulse 93, temperature 98.8 F (37.1 C), temperature source Oral, resp. rate 16, height 5' 2.99" (1.6 m), weight 41.1 kg, SpO2 97 %.  Medical Problem List and Plan: 1. Functional deficits secondary to incomplete nontraumatic quadriplegia due to metastatic breast cancer              -patient may  shower             -ELOS/Goals: 12 to 14 days, PT OT min assist         Con't CIR PT and OT  Set d/c for 1/31 before starts other radiation.   -Con't CIR- con't PT and OT- needs handicapped placard 2.  Antithrombotics: -DVT/anticoagulation:  Pharmaceutical: Lovenox 1/19- the Dopplers are negative              -antiplatelet therapy: N/A 3. Pain Management:  Low dose oxycodone, tylenol 650 every 4 hours as needed and flexeril prn --On gabapentin 100 mg 3 times daily for  neuropathy.  1/18- pain controlled right now- con't regimen and monitor while doing therapy 1/20- al level SCI pain- increased gabapentin to 200 mg TID  1/22- shoulder and hip pain better with muscle relaxants- and nerve pain better with increase in gabapentin- con't regimen and monitor 1/24- changed Oxy to Norco- suggested taking before leaves for radiation 1/25- pain better with Norco 1/29-pain controlled- will need pain meds after d/c- 7 days of meds and then can refill for  her unless Oncology wants to prescribe.  4. Mood/Behavior/Sleep: LCSW to follow for evaluation and support.             --trazodone prn for insomnia. Continue ativan prn for anxiety.  Continue Celexa for mood.  1/24 - due to increased anxiety, suggested taking Benzo before leaves and will increase Celexa to 40 mg daily  1/29- anxiety doing a little better             -  antipsychotic agents: N/A 5. Neuropsych/cognition: This patient is capable of making decisions on her own behalf. 6. Skin/Wound Care: removed operative dressing today. No further dressing needed. 7. Fluids/Electrolytes/Nutrition: Monitor I/O. Check CMET in am. 8. Breast cancer with mets to brain/spine (liver/lung?): To start XRT 01/24 per chart review             --abnormal LFTs with A phos-[id number]  1/18- Brain MRI showed lesions 1-2+ mm worse and 2 new lesions- concerning, but starts radiation 1/24 while in rehab  1/19- spoke with SW- will move appointments to afternoon so can participate in therapy first.   1/23- d/w pt- so starts tomorrow   1/25- Did well with radiation- caused some dizziness later on, but went well with Ativan and Norco.   1/28 still limited in activity tolerance at times d/t dizziness/headaches             -Followed by Dr. Truitt Merle oncology 9. Hyponatremia: Mild with sodium at 134.  Recheck lytes in am for follow up 10. Leucocytosis: WBC 13.4 on 01/14--> repeat CBC in am.              --Monitor for fevers and other signs of infection.   1/18- WBC down to 9.3- doing better 11. High blood pressure: SBP 140-150 range. Monitor for trends and with activity. .  --Will order orthostatic vitals as dizziness reported with activity 09/07/17- - BP's still [id number] systolic- don't want to add another medicine right now- will wait to add meds.   1/20- don't want to add more meds, esp because SCI pts have a tendency to orthostatic hypotension Vitals:   09/24/22 2009 09/25/22 0355  BP: 133/62 139/65  Pulse: 91 93  Resp:  20 16  Temp: 98 F (36.7 C) 98.8 F (37.1 C)  SpO2: 98% 97%    1/29- BP controlled- not too low or high- con't regimen 12. Neurogenic bowel- on bowel program: Had BM with use of enema and none for 2-3 days.  --Dose of miralax again today followed by suppository after supper.  -Dulcolax suppository daily, MiraLAX as needed, Senokot daily, sorbitol as needed  1/18- small BM with bowel program last night- con't regimen 1/19- Small BM last night- since soft, won't increase Colace, but will increase Senna to 1 tab BID 1/20- LBM overnight with suppository 1/22- having BM's- with suppository and 1 this AM- good sized, soft and on bedpan.  1/28 continues to move bowels on her own  1/29- 2 Bms in last 24 hours 13. Neurogenic bladder: Did have incontinence at admission that's better but has been using purewick and reports that she's getting up wet.              --monitor voiding with PVR/bladder scans. Check UA. 1/18- U/A negative- is voiding some with urinal- will con't PVRs   1/20- voiding with urinal- hasn't needed cathing 1/22- voiding with urinal- asking if can get up to [facility] soon- will d/w therapy 1/28 improved emptying 14 hypothyroidism.  Continue Synthroid 75 mcg daily. 15. At level SCI pain- is actually in thoracic area, but is textbook- will increase gabapentin to 200 mg TID and monitor pain issues- explained will never go away, but can get better  1/22- pain is doing a little better with increase in gabapentin 16. GERD  1/22- will add Protonix 40 mg daily for reflux Sx's- Tums didn't work.  17. Dry mouth- due to meds/environment  1/23- will add biotene mouthwash for pt. Is prn  1/26- says  it's doing better- con't regimen   !Will need handicapped placard!  LOS: 12 days A FACE TO FACE EVALUATION WAS PERFORMED  Martha [name] 09/25/2022, 9:01 AM

## 2022-09-25 NOTE — Progress Notes (Signed)
Physical Therapy Weekly Progress Note  Patient Details  Name: Martha [name] MRN: [id number] Date of Birth: December 21, 1960  Beginning of progress report period: September 14, 2022 End of progress report period: September 25, 2022  Today's Date: 09/25/2022 PT Individual Time: 0915-1015 PT Individual Time Calculation (min): 60 min   Patient has met 3 of 3 short term goals.  Pt is demoing steady improvement toward LTGs and is on track for ELOS. Pt currently requires CGA for all standing mobility and supervision for bed mobility. Family education has been initiated with pt's husband. W/c eval completed on this date, but pt is hesitant and would like the opportunity to trial a light weight chair before moving forward, plan to continue discussing and trial if vendor can provide appropriate trial/loaner chair.  Patient continues to demonstrate the following deficits muscle weakness, decreased cardiorespiratoy endurance, impaired timing and sequencing, abnormal tone, unbalanced muscle activation, and ataxia, and decreased standing balance, decreased balance strategies, and difficulty maintaining precautions and therefore will continue to benefit from skilled PT intervention to increase functional independence with mobility.  Patient progressing toward long term goals..  Continue plan of care.  PT Short Term Goals Week 1:  PT Short Term Goal 1 (Week 1): Pt will ambulate x 50 ft with assist and LRAD PT Short Term Goal 1 - Progress (Week 1): Met PT Short Term Goal 2 (Week 1): Pt will perform bed mobility with CGA PT Short Term Goal 2 - Progress (Week 1): Met PT Short Term Goal 3 (Week 1): Pt will sit OOB between therapy sessions for improved activity tolerance. PT Short Term Goal 3 - Progress (Week 1): Met Week 2:  PT Short Term Goal 1 (Week 2): =LTGs d/t ELOS  Skilled Therapeutic Interventions/Progress Updates:    Pt recd in gym with OT. First part of session focused on w/c eval and discussion of  options. Pt needs time to think about options and would like to trial light weight chair that is more similar to what she would be using. Plan to hold on process of getting chair to allow pt to think about it and trial some of the options.   Pt performed NMR for improved coordination and balance as follows. Pt performed combination Sit to stand>marching without UE support for 2 x 4 with CGA-min A. Pt can perform Sit to stand with as little as CGA, but occasionally needs cueing or min A d/t poor set up. Pt also performed crossing cone taps and straight cone tip overs for improved coordination. Pt demoes good problem solving and improving single leg stance stability with RW support and CGA overall.   Pt then ambulated x >200 ft back to room. Pt had uncontrolled lateral descent to bed, but states she knew where she was falling to. Discussed safety measures like turning to bed to sit to maintain cervical precautions and general safety. Pt was left with all needs in reach and alarm active.   Therapy Documentation Precautions:  Precautions Precautions: Cervical, Fall Precaution Comments: verbally reviewed and pt able to recall with minimal cueing Required Braces or Orthoses: Cervical Brace Cervical Brace: Hard collar Restrictions Weight Bearing Restrictions: No General:      Therapy/Group: Individual Therapy  Mickel Fuchs 09/25/2022, 10:25 AM

## 2022-09-25 NOTE — Progress Notes (Signed)
Physical Therapy Session Note  Patient Details  Name: Martha [name] MRN: [id number] Date of Birth: [birth year]  Today's Date: 09/25/2022 PT Individual Time: 1117-1205 PT Individual Time Calculation (min): 48 min   Short Term Goals: Week 1:  PT Short Term Goal 1 (Week 1): Pt will ambulate x 50 ft with assist and LRAD PT Short Term Goal 1 - Progress (Week 1): Met PT Short Term Goal 2 (Week 1): Pt will perform bed mobility with CGA PT Short Term Goal 2 - Progress (Week 1): Met PT Short Term Goal 3 (Week 1): Pt will sit OOB between therapy sessions for improved activity tolerance. PT Short Term Goal 3 - Progress (Week 1): Met Week 2:  PT Short Term Goal 1 (Week 2): =LTGs d/t ELOS  Skilled Therapeutic Interventions/Progress Updates:  Patient supine in bed and asleep on entrance to room. Patient easily roused, then alert and agreeable to PT session.   Patient with no pain complaint at start of session.  Therapeutic Activity: Bed Mobility: Pt performed supine --> sit with Mod I. No vc/ tc required for technique. Dons shoes with setup while seated EOB. At end of session, pt is able to return to supine with  mod I and use of bed features.  Transfers: Pt performed sit<>stand and stand pivot transfers throughout session with ***. Provided verbal cues for***.  Gait Training:  Pt ambulated *** ft using *** with ***. Demonstrated ***. Provided vc/ tc for ***.  Wheelchair Mobility:  Pt propelled wheelchair *** feet with ***. Provided vc for ***.  Neuromuscular Re-ed: NMR facilitated during session with focus on***. Pt guided in ***. NMR performed for improvements in motor control and coordination, balance, sequencing, judgement, and self confidence/ efficacy in performing all aspects of mobility at highest level of independence.   Therapeutic Exercise: Pt performed the following exercises with vc/ tc for proper technique. ***  Patient *** at end of session with brakes locked, *** alarm  set, and all needs within reach.  - standing on wedge cushion blue and self perturbations with arm movements - obstacle course walking over blue foam wedge, then with hurdles and using RW. - obstacle course with no RW - collection of 2 weighted balls from day room requiring pt to problem solve safe approach and collection - forward stepping with no RW and then backward ambulation with Rw for balance   Therapy Documentation Precautions:  Precautions Precautions: Cervical, Fall Precaution Comments: verbally reviewed and pt able to recall with minimal cueing Required Braces or Orthoses: Cervical Brace Cervical Brace: Hard collar Restrictions Weight Bearing Restrictions: No General:   Vital Signs:  Pain: Pain Assessment Pain Score: 0-No pain  Therapy/Group: Individual Therapy  Alger Simons PT, DPT, CSRS 09/25/2022, 6:32 PM

## 2022-09-25 NOTE — Progress Notes (Signed)
Occupational Therapy Session Note  Patient Details  Name: Martha [name] MRN: [id number] Date of Birth: 10/05/1960  Today's Date: 09/25/2022 OT Individual Time: 0815-0900 OT Individual Time Calculation (min): 45 min    Short Term Goals: Week 2:  OT Short Term Goal 1 (Week 2): STG=LTG 2/2 ELOS (continue working towareds supervision LTGs)  Skilled Therapeutic Interventions/Progress Updates:    Pt resting in bed upon arrival. Pt amb with RW to bathroom and completed toileting tasks with CGA. Pt returned to room and stood at sink to wash hands and brush teeth. Pt sat back into w/c to complete UB dressing tasks. Pt transitioned to day room for table tasks with focus on Bil pinch strengthening. Pt issued foam blocks for strengthening. Pt remained in w/c for w/c eval.   Therapy Documentation Precautions:  Precautions Precautions: Cervical, Fall Precaution Comments: verbally reviewed and pt able to recall with minimal cueing Required Braces or Orthoses: Cervical Brace Cervical Brace: Hard collar Restrictions Weight Bearing Restrictions: No    Pain:  Pt denies pain this morning    Therapy/Group: Individual Therapy  Leroy Libman 09/25/2022, 11:53 AM

## 2022-09-25 NOTE — Progress Notes (Signed)
Occupational Therapy Session Note  Patient Details  Name: Martha [name] MRN: [id number] Date of Birth: 03/14/1961  Today's Date: 09/25/2022 OT Individual Time: 1345-1430 OT Individual Time Calculation (min): 45 min    Short Term Goals: Week 2:  OT Short Term Goal 1 (Week 2): STG=LTG 2/2 ELOS (continue working towareds supervision LTGs)  Skilled Therapeutic Interventions/Progress Updates:    Pt resting in bed upon arrival and ready for therapy. OT intervention with fcous on bed mobility, functional amb with RW, standing balance, and BUE/BLE therex to increase endurance, strength, and independence with BADLs. Supine>sit EOB and tranfser to w/c with supervision. NuStep 3.5 mins level 6, 3.5 mins level 5 with BLE/BUE and 5 mins level 3 BLE only. Pt stood at window to place and reamove squigz with rest break x1. Pt propelled w/c back to room and transferred to EOB.Sit>supine with supervision. Pt remained in bed with all needs within reach and bed alarm activated.   Therapy Documentation Precautions:  Precautions Precautions: Cervical, Fall Precaution Comments: verbally reviewed and pt able to recall with minimal cueing Required Braces or Orthoses: Cervical Brace Cervical Brace: Hard collar Restrictions Weight Bearing Restrictions: No Pain:  Pt denies pain this afternoon  Therapy/Group: Individual Therapy  Leroy Libman 09/25/2022, 2:40 PM

## 2022-09-26 ENCOUNTER — Encounter: Payer: Self-pay | Admitting: Hematology

## 2022-09-26 ENCOUNTER — Other Ambulatory Visit (HOSPITAL_COMMUNITY): Payer: Self-pay

## 2022-09-26 ENCOUNTER — Other Ambulatory Visit: Payer: Self-pay | Admitting: Physical Medicine and Rehabilitation

## 2022-09-26 LAB — COMPREHENSIVE METABOLIC PANEL
ALT: 28 U/L (ref 0–44)
AST: 78 U/L — ABNORMAL HIGH (ref 15–41)
Albumin: 3.3 g/dL — ABNORMAL LOW (ref 3.5–5.0)
Alkaline Phosphatase: 896 U/L — ABNORMAL HIGH (ref 38–126)
Anion gap: 7 (ref 5–15)
BUN: 21 mg/dL (ref 8–23)
CO2: 29 mmol/L (ref 22–32)
Calcium: 10.1 mg/dL (ref 8.9–10.3)
Chloride: 99 mmol/L (ref 98–111)
Creatinine, Ser: 0.44 mg/dL (ref 0.44–1.00)
GFR, Estimated: >60 mL/min (ref >60)
Glucose, Bld: 104 mg/dL — ABNORMAL HIGH (ref 70–99)
Potassium: 3.9 mmol/L (ref 3.5–5.1)
Sodium: 135 mmol/L (ref 135–145)
Total Bilirubin: 0.6 mg/dL (ref 0.3–1.2)
Total Protein: 6 g/dL — ABNORMAL LOW (ref 6.5–8.1)

## 2022-09-26 MED ORDER — LEVOTHYROXINE SODIUM 75 MCG PO TABS
75.0000 ug | ORAL_TABLET | Freq: Every day | ORAL | 0 refills | Status: DC
  Filled 2022-09-26 – 2023-08-30 (×3): qty 30, 30d supply, fill #0

## 2022-09-26 MED ORDER — ASCORBIC ACID 1000 MG PO TABS: 1000.0000 mg | ORAL_TABLET | Freq: Every day | ORAL | 0 refills | Status: DC

## 2022-09-26 MED ORDER — GABAPENTIN 100 MG PO CAPS: 200.0000 mg | ORAL_CAPSULE | Freq: Three times a day (TID) | ORAL | 0 refills | Status: DC

## 2022-09-26 MED ORDER — APIXABAN 2.5 MG PO TABS: 2.5000 mg | ORAL_TABLET | Freq: Two times a day (BID) | ORAL | 0 refills | Status: DC

## 2022-09-26 MED ORDER — APIXABAN 2.5 MG PO TABS
2.5000 mg | ORAL_TABLET | Freq: Two times a day (BID) | ORAL | 0 refills | Status: DC
  Filled 2022-09-26: qty 86, 43d supply, fill #0

## 2022-09-26 MED ORDER — APIXABAN 2.5 MG PO TABS
2.5000 mg | ORAL_TABLET | Freq: Two times a day (BID) | ORAL | Status: DC
  Administered 2022-09-26 – 2022-09-27 (×3): 2.5 mg via ORAL
  Filled 2022-09-26 (×3): qty 1

## 2022-09-26 MED ORDER — CYCLOBENZAPRINE HCL 10 MG PO TABS: 10.0000 mg | ORAL_TABLET | Freq: Three times a day (TID) | ORAL | 0 refills | Status: DC | PRN

## 2022-09-26 MED ORDER — TRAZODONE HCL 50 MG PO TABS
25.0000 mg | ORAL_TABLET | Freq: Every evening | ORAL | 0 refills | Status: DC | PRN
  Filled 2022-09-26: qty 30, 30d supply, fill #0

## 2022-09-26 MED ORDER — ORAL CARE MOUTH RINSE: 15.0000 mL | OROMUCOSAL | 0 refills | Status: AC | PRN

## 2022-09-26 MED ORDER — ASCORBIC ACID 1000 MG PO TABS
1000.0000 mg | ORAL_TABLET | Freq: Every day | ORAL | 0 refills | Status: DC
  Filled 2022-09-26: qty 30, 30d supply, fill #0

## 2022-09-26 MED ORDER — TRAZODONE HCL 50 MG PO TABS: 25.0000 mg | ORAL_TABLET | Freq: Every evening | ORAL | 0 refills | Status: DC | PRN

## 2022-09-26 MED ORDER — PANTOPRAZOLE SODIUM 40 MG PO TBEC
40.0000 mg | DELAYED_RELEASE_TABLET | Freq: Every day | ORAL | 0 refills | Status: DC
  Filled 2022-09-26: qty 30, 30d supply, fill #0

## 2022-09-26 MED ORDER — CITALOPRAM HYDROBROMIDE 40 MG PO TABS: 40.0000 mg | ORAL_TABLET | Freq: Every day | ORAL | 0 refills | Status: DC

## 2022-09-26 MED ORDER — GABAPENTIN 100 MG PO CAPS
200.0000 mg | ORAL_CAPSULE | Freq: Three times a day (TID) | ORAL | 0 refills | Status: DC
  Filled 2022-09-26: qty 180, 30d supply, fill #0

## 2022-09-26 MED ORDER — SENNOSIDES-DOCUSATE SODIUM 8.6-50 MG PO TABS
1.0000 | ORAL_TABLET | Freq: Two times a day (BID) | ORAL | 0 refills | Status: DC
  Filled 2022-09-26: qty 60, 30d supply, fill #0

## 2022-09-26 MED ORDER — HYDROCODONE-ACETAMINOPHEN 5-325 MG PO TABS: 1.0000 | ORAL_TABLET | ORAL | 0 refills | Status: DC | PRN

## 2022-09-26 MED ORDER — CYCLOBENZAPRINE HCL 10 MG PO TABS
10.0000 mg | ORAL_TABLET | Freq: Three times a day (TID) | ORAL | 0 refills | Status: DC | PRN
  Filled 2022-09-26: qty 30, 10d supply, fill #0

## 2022-09-26 MED ORDER — ORAL CARE MOUTH RINSE: 15.0000 mL | OROMUCOSAL | 0 refills | Status: DC | PRN

## 2022-09-26 MED ORDER — SENNOSIDES-DOCUSATE SODIUM 8.6-50 MG PO TABS: 1.0000 | ORAL_TABLET | Freq: Two times a day (BID) | ORAL | 0 refills | Status: DC

## 2022-09-26 MED ORDER — HYDROCODONE-ACETAMINOPHEN 5-325 MG PO TABS
1.0000 | ORAL_TABLET | ORAL | 0 refills | Status: DC | PRN
  Filled 2022-09-26: qty 30, 5d supply, fill #0

## 2022-09-26 MED ORDER — CITALOPRAM HYDROBROMIDE 40 MG PO TABS
40.0000 mg | ORAL_TABLET | Freq: Every day | ORAL | 0 refills | Status: DC
  Filled 2022-09-26: qty 30, 30d supply, fill #0

## 2022-09-26 MED ORDER — PANTOPRAZOLE SODIUM 40 MG PO TBEC: 40.0000 mg | DELAYED_RELEASE_TABLET | Freq: Every day | ORAL | 0 refills | Status: DC

## 2022-09-26 NOTE — Progress Notes (Signed)
Inpatient Rehabilitation Discharge Medication Review by a Pharmacist  A complete drug regimen review was completed for this patient to identify any potential clinically significant medication issues.  High Risk Drug Classes Is patient taking? Indication by Medication  Antipsychotic No   Anticoagulant Yes Apixaban - VTE ppx  Antibiotic No   Opioid Yes Prn Vicodin - pain  Antiplatelet No   Hypoglycemics/insulin No   Vasoactive Medication No   Chemotherapy Yes, Oral Chemotherapy Po letrozole - breast cancer  Other Yes Citalopram - mood Cyclobenzaprine - muscle spasms  Gabapentin - neuropathy Levothyroxine - low thyroid Pantoprazole - Reflux Trazodone - prn sleep     Type of Medication Issue Identified Description of Issue Recommendation(s)  Drug Interaction(s) (clinically significant)     Duplicate Therapy     Allergy     No Medication Administration End Date     Incorrect Dose     Additional Drug Therapy Needed     Significant med changes from prior encounter (inform family/care partners about these prior to discharge).    Other       Clinically significant medication issues were identified that warrant physician communication and completion of prescribed/recommended actions by midnight of the next day:  No  Pharmacist comments: None  Time spent performing this drug regimen review (minutes):  30 minutes  Thank you Anette Guarneri, PharmD 09/26/2022 12:58 PM

## 2022-09-26 NOTE — Progress Notes (Signed)
Occupational Therapy Session Note  Patient Details  Name: Martha [name] MRN: [id number] Date of Birth: [birth year]  Today's Date: 09/26/2022 OT Individual Time: 1093-2355 OT Individual Time Calculation (min): 55 min    Short Term Goals: Week 2:  OT Short Term Goal 1 (Week 2): STG=LTG 2/2 ELOS (continue working towareds supervision LTGs)  Skilled Therapeutic Interventions/Progress Updates:    Pt resting in bed upon arrival and agreeable to therapy. OT intervention with focus on bed mobility, UB dressing at sink, functional amb with RW, standing balance, NuStep for strengthening, and safety awareness to increase independence with BADLs. Pt with upset stomach during session, progressively getting worse. RN aware. UB dressing with supervision seated in w/c at sink. Pt stood at sink to brush teeth. Functional amb with RW at supervision level. Furniture transfers and kitchen activities with supervision amb with RW. Sit<>stand with supervision. NuStep 7 mins level 5. Pt with HA later in session and requested to return to bed at end of session. Bed alarm activated.   Therapy Documentation Precautions:  Precautions Precautions: Cervical, Fall Precaution Comments: verbally reviewed and pt able to recall with minimal cueing Required Braces or Orthoses: Cervical Brace Cervical Brace: Hard collar Restrictions Weight Bearing Restrictions: No   Pain:  Pt reports RUE (distallly from elbow) numbness, slight headache, and midsection discomfort; RN aware and repositioned   Therapy/Group: Individual Therapy  Leroy Libman 09/26/2022, 8:57 AM

## 2022-09-26 NOTE — Progress Notes (Signed)
Occupational Therapy Discharge Summary  Patient Details  Name: Martha [name] MRN: [id number] Date of Birth: 1961-02-27  Date of Discharge from [place] service:September 26, 2022  Patient has met 13 of 13 long term goals due to {due to:3041651}.  Pt made steady progress with BADLs, IADLs, and functional transfers during this admission. Pt fatigues quickly but demonstrates appropriate awareness and employs energy conservation strategies. Pt completes all BADLs with supervision/mod I. Functional transfers with supervision.Pt's husband has been present and participated in education sessions. Pt Patient to discharge at overall {LOA:3049010} level.  Patient's care partner {care partner:3041650} to provide the necessary {assistance:3041652} assistance at discharge.    Reasons goals not met: n/a  Recommendation:  Patient will benefit from ongoing skilled OT services in {setting:3041680} to continue to advance functional skills in the area of {ADL/iADL:3041649}.  Equipment: BSC  Reasons for discharge: {Reason for discharge:3049018}  Patient/family agrees with progress made and goals achieved: {Pt/Family agree with progress/goals:3049020}  OT Discharge ADL ADL Eating: Modified independent Where Assessed-Eating: Wheelchair Grooming: Independent Where Assessed-Grooming: Sitting at sink Upper Body Bathing: Supervision/safety Where Assessed-Upper Body Bathing: Shower Lower Body Bathing: Supervision/safety Where Assessed-Lower Body Bathing: Shower Upper Body Dressing: Supervision/safety Where Assessed-Upper Body Dressing: Sitting at sink Lower Body Dressing: Supervision/safety Where Assessed-Lower Body Dressing: Standing at sink, Sitting at sink Toileting: Supervision/safety Where Assessed-Toileting: Glass blower/designer: Close supervision Toilet Transfer Method: Counselling psychologist: Energy manager: Unable to assess Tub/Shower Transfer Method: Unable to  assess Social research officer, government: Close supervision Social research officer, government Method: Radiographer, therapeutic: Gaffer Baseline Vision/History: 0 No visual deficits Patient Visual Report: No change from baseline Vision Assessment?: No apparent visual deficits Perception  Perception: Within Functional Limits Praxis Praxis: Intact Cognition Cognition Overall Cognitive Status: Within Functional Limits for tasks assessed Arousal/Alertness: Awake/alert Orientation Level: Person;Place;Situation Person: Oriented Place: Oriented Situation: Oriented Memory: Appears intact Attention: Sustained Sustained Attention: Appears intact Awareness: Appears intact Problem Solving: Appears intact Safety/Judgment: Appears intact Brief Interview for Mental Status (BIMS) Repetition of Three Words (First Attempt): 3 Temporal Orientation: Year: Correct Temporal Orientation: Month: Accurate within 5 days Temporal Orientation: Day: Correct Recall: "Sock": Yes, no cue required Recall: "Blue": Yes, no cue required Recall: "Bed": Yes, no cue required BIMS Summary Score: 15 Sensation Sensation Light Touch: Appears Intact Hot/Cold: Appears Intact Proprioception: Appears Intact Stereognosis: Not tested Coordination Gross Motor Movements are Fluid and Coordinated: No Fine Motor Movements are Fluid and Coordinated: No Coordination and Movement Description: ataxic and decreased balance strategies Finger Nose Finger Test: ataxic bilaterally L>R Motor  Motor Motor: Ataxia Motor - Skilled Clinical Observations: Pt with ataxia LE>UE Trunk/Postural Assessment  Cervical Assessment Cervical Assessment: Exceptions to [facility] (cervical collar) Thoracic Assessment Thoracic Assessment: Within Functional Limits Lumbar Assessment Lumbar Assessment: Within Functional Limits Postural Control Postural Control: Deficits on evaluation  Balance Static Sitting Balance Static Sitting -  Balance Support: Feet supported;No upper extremity supported Static Sitting - Level of Assistance: 6: Modified independent (Device/Increase time) Dynamic Sitting Balance Dynamic Sitting - Balance Support: Feet supported;No upper extremity supported;During functional activity Dynamic Sitting - Level of Assistance: 6: Modified independent (Device/Increase time) Extremity/Trunk Assessment RUE Assessment RUE Assessment: Exceptions to [facility] Active Range of Motion (AROM) Comments: Limited shoulder flexion due to cervical precautions however proximal weakness limiting shoulder to 80 degrees, WFL distally General Strength Comments: Grossly 3-/5 LUE Assessment LUE Assessment: Exceptions to [facility] Active Range of Motion (AROM) Comments: Limited shoulder flexion due to cervical precautions however proximal weakness  limiting shoulder to 80 degrees, WFL distally General Strength Comments: Grossly 3-/5   Leroy Libman 09/26/2022, 7:04 AM

## 2022-09-26 NOTE — Progress Notes (Signed)
Physical Therapy Session Note  Patient Details  Name: Martha [name] MRN: [id number] Date of Birth: [birth year]  Today's Date: 09/26/2022 PT Individual Time: 1032-1115 PT Individual Time Calculation (min): 43 min   Short Term Goals: Week 1:  PT Short Term Goal 1 (Week 1): Pt will ambulate x 50 ft with assist and LRAD PT Short Term Goal 1 - Progress (Week 1): Met PT Short Term Goal 2 (Week 1): Pt will perform bed mobility with CGA PT Short Term Goal 2 - Progress (Week 1): Met PT Short Term Goal 3 (Week 1): Pt will sit OOB between therapy sessions for improved activity tolerance. PT Short Term Goal 3 - Progress (Week 1): Met Week 2:  PT Short Term Goal 1 (Week 2): =LTGs d/t ELOS   Skilled Therapeutic Interventions/Progress Updates:  Patient supine in bed and asleep on entrance to room. Patient alert and agreeable to PT session.   Pt relates that she had an episode of pain in her upper back during session with C[facility] in the morning. Has been resting with her back flat and feels better now, ready to attempt therapy.   Therapeutic Activity: Bed Mobility: Pt performed supine <> sit with Mod I and increased effort d/t earlier pain in back. Dons shoes seated EOB with setup. Transfers: Pt performed sit<>stand and stand pivot transfers throughout session with close supervision. No vc required for tehnique.  Gait Training:  Pt ambulated 130' x1 using RW with close supervision. Demonstrated anterior placement of pelvis to feet in improve stability. Provided vc/ tc for maintaining slow pace.  Wheelchair Mobility:  Pt propelled wheelchair 65 feet with BUE and Mod I. No vc for technique. Pt is able to demo turn in place and turns with supervision/ Mod I.  Neuromuscular Re-ed: NMR facilitated during session with focus on standing balance. Pt guided in [place] test. Patient demonstrates increased fall risk as noted by score of  31 /56 on Berg Balance Scale.  (<36= high risk for falls, close  to 100%; 37-45 significant >80%; 46-51 moderate >50%; 52-55 lower >25%)     Pt guided in dynamic balance challenge with colored discs placed outside of BOS in arc around pat at table height. Pt is able to take one step with reach to touch disc with called color. No LOB and no incorrect reaches to color.   NMR performed for improvements in motor control and coordination, balance, sequencing, judgement, and self confidence/ efficacy in performing all aspects of mobility at highest level of independence.   Patient supine in bed at end of session with brakes locked, bed alarm set, and all needs within reach.   Therapy Documentation Precautions:  Precautions Precautions: Cervical, Fall Precaution Booklet Issued: No Precaution Comments: verbally reviewed and pt able to recall with minimal cueing Required Braces or Orthoses: Cervical Brace Cervical Brace: Hard collar Restrictions Weight Bearing Restrictions: No General:   Vital Signs:   Pain: Pain Assessment Pain Scale: 0-10 Pain Score: 0-No pain Mobility: Bed Mobility Bed Mobility: Left Sidelying to Sit;Sitting - Scoot to Edge of Bed;Sit to Sidelying Right Left Sidelying to Sit: Independent with assistive device Sitting - Scoot to Edge of Bed: Independent with assistive device Sit to Sidelying Right: Independent with assistive device Transfers Transfers: Sit to Stand;Stand Pivot Transfers Sit to Stand: Independent with assistive device;Supervision/Verbal cueing Stand Pivot Transfers: Independent with assistive device;Supervision/Verbal cueing Transfer (Assistive device): Rolling walker Locomotion : Gait Ambulation: Yes Gait Assistance: Supervision/Verbal cueing Gait Distance (Feet): 80 Feet Assistive device:  Rolling walker Gait Gait: Yes Gait Pattern: Impaired Gait Pattern: Ataxic Gait velocity: decreased Stairs / Additional Locomotion Stairs: Yes Stairs Assistance: Contact Guard/Touching assist Stair Management  Technique: Two rails Number of Stairs: 4 Height of Stairs: 6 Ramp: Contact Guard/touching assist Curb: Nurse, mental health Mobility: Yes Wheelchair Assistance: Independent with Camera operator: Both upper extremities Wheelchair Parts Management: Independent Distance: 500 ft  Trunk/Postural Assessment : Cervical Assessment Cervical Assessment: Exceptions to [facility] Thoracic Assessment Thoracic Assessment: Within Functional Limits Lumbar Assessment Lumbar Assessment: Within Functional Limits Postural Control Postural Control: Deficits on evaluation  Balance: Balance Balance Assessed: Yes Standardized Balance Assessment Standardized Balance Assessment: Berg Balance Test Berg Balance Test Sit to Stand: Able to stand using hands after several tries Standing Unsupported: Able to stand 2 minutes with supervision Sitting with Back Unsupported but Feet Supported on Floor or Stool: Able to sit safely and securely 2 minutes Stand to Sit: Uses backs of legs against chair to control descent Transfers: Able to transfer with verbal cueing and /or supervision Standing Unsupported with Eyes Closed: Able to stand 10 seconds with supervision Standing Ubsupported with Feet Together: Able to place feet together independently and stand for 1 minute with supervision From Standing, Reach Forward with Outstretched Arm: Loses balance while trying/requires external support (back precautions; unable to hip hinge for forward reach) From Standing Position, Pick up Object from Floor: Unable to pick up and needs supervision From Standing Position, Turn to Look Behind Over each Shoulder: Turn sideways only but maintains balance (no twist to spine or neck, is able to bend hips/ knees just enough to turn sideways.) Turn 360 Degrees: Able to turn 360 degrees safely but slowly Standing Unsupported, Alternately Place Feet on Step/Stool: Able to stand  independently and complete 8 steps >20 seconds Standing Unsupported, One Foot in Front: Able to plae foot ahead of the other independently and hold 30 seconds Standing on One Leg: Tries to lift leg/unable to hold 3 seconds but remains standing independently Total Score: 31 Static Sitting Balance Static Sitting - Balance Support: Feet supported;No upper extremity supported Static Sitting - Level of Assistance: 6: Modified independent (Device/Increase time) Dynamic Sitting Balance Dynamic Sitting - Balance Support: Feet supported;No upper extremity supported;During functional activity Dynamic Sitting - Level of Assistance: 6: Modified independent (Device/Increase time) Static Standing Balance Static Standing - Balance Support: During functional activity;Bilateral upper extremity supported Static Standing - Level of Assistance: 6: Modified independent (Device/Increase time) Dynamic Standing Balance Dynamic Standing - Balance Support: During functional activity;No upper extremity supported;Bilateral upper extremity supported Dynamic Standing - Level of Assistance: 5: Stand by assistance   Therapy/Group: Individual Therapy  Alger Simons PT, DPT, CSRS 09/26/2022, 12:49 PM

## 2022-09-26 NOTE — Progress Notes (Signed)
PROGRESS NOTE   Subjective/Complaints:  Pt happy today is last day in rehab- leaving tomorrow AM.  Slept well; LBM this AM.  Has a busy day planned.    ROS:   Pt denies SOB, abd pain, CP, N/V/C/D, and vision changes   Objective:   No results found. Recent Labs    09/25/22 0607  WBC 6.1  HGB 13.3  HCT 38.0  PLT 242    Recent Labs    09/26/22 0602  NA 135  K 3.9  CL 99  CO2 29  GLUCOSE 104*  BUN 21  CREATININE 0.44  CALCIUM 10.1     Intake/Output Summary (Last 24 hours) at 09/26/2022 0814 Last data filed at 09/25/2022 2204 Gross per 24 hour  Intake 800 ml  Output 639 ml  Net 161 ml        Physical Exam: Vital Signs Blood pressure 130/73, pulse 95, temperature 98.4 F (36.9 C), temperature source Oral, resp. rate 16, height 5' 2.99" (1.6 m), weight 40.3 kg, SpO2 97 %.            General: awake, alert, appropriate, supine in bed; NAD HENT: conjugate gaze; oropharynx moist- cervical collar in place CV: regular rate; no JVD Pulmonary: CTA B/L; no W/R/R- good air movement GI: soft, NT, ND, (+)BS Psychiatric: appropriate; bright affect Neurological: Ox3  Ext: no clubbing, cyanosis, or edema Psych: pleasant and cooperative   Skin: Posterior neck incision C/D/I. Neuro:  Alert and appropriate without dysarthria, speech clear with normal rate.  Bilateral ataxia worse on right.  Altered sensation to light touch primarily in the hands and feet bilaterally.  Cranial nerves II through XII intact, Right upper extremity shoulder abduction 1-2, elbow flexion 4- out of 5, elbow extension 2 out of 5, finger flexion 4-/5 Left upper extremity shoulder abduction 1-2, elbow flexion 4- out of 5, elbow extension 1-2 out of 5, finger flexion 4-/5 Left lower extremity strength 4 out of 5, right lower extremity strength 4- out of 5 Musculoskeletal: No joint swelling or tenderness noted. In cervical collar       Assessment/Plan: 1. Functional deficits which require 3+ hours per day of interdisciplinary therapy in a comprehensive inpatient rehab setting. Physiatrist is providing close team supervision and 24 hour management of active medical problems listed below. Physiatrist and rehab team continue to assess barriers to discharge/monitor patient progress toward functional and medical goals  Care Tool:  Bathing    Body parts bathed by patient: Right arm, Left arm, Chest, Abdomen, Front perineal area, Right upper leg, Left upper leg, Right lower leg, Left lower leg, Face   Body parts bathed by helper: Buttocks, Right arm, Left arm, Right lower leg, Left lower leg     Bathing assist Assist Level: Supervision/Verbal cueing     Upper Body Dressing/Undressing Upper body dressing   What is the patient wearing?: Pull over shirt, Orthosis    Upper body assist Assist Level: Supervision/Verbal cueing    Lower Body Dressing/Undressing Lower body dressing      What is the patient wearing?: Pants, Underwear/pull up     Lower body assist Assist for lower body dressing: Supervision/Verbal cueing  Toileting Toileting    Toileting assist Assist for toileting: Supervision/Verbal cueing     Transfers Chair/bed transfer  Transfers assist     Chair/bed transfer assist level: Moderate Assistance - Patient 50 - 74%     Locomotion Ambulation   Ambulation assist      Assist level: Moderate Assistance - Patient 50 - 74% Assistive device: Walker-rolling Max distance: 60 ft   Walk 10 feet activity   Assist     Assist level: Moderate Assistance - Patient - 50 - 74% Assistive device: Walker-rolling   Walk 50 feet activity   Assist    Assist level: Moderate Assistance - Patient - 50 - 74% Assistive device: Walker-rolling    Walk 150 feet activity   Assist Walk 150 feet activity did not occur: Safety/medical concerns         Walk 10 feet on uneven surface   activity   Assist Walk 10 feet on uneven surfaces activity did not occur: Safety/medical concerns         Wheelchair     Assist Is the patient using a wheelchair?: Yes Type of Wheelchair: Manual    Wheelchair assist level: Supervision/Verbal cueing Max wheelchair distance: 100 ft    Wheelchair 50 feet with 2 turns activity    Assist        Assist Level: Supervision/Verbal cueing   Wheelchair 150 feet activity     Assist      Assist Level: Minimal Assistance - Patient > 75%   Blood pressure 130/73, pulse 95, temperature 98.4 F (36.9 C), temperature source Oral, resp. rate 16, height 5' 2.99" (1.6 m), weight 40.3 kg, SpO2 97 %.  Medical Problem List and Plan: 1. Functional deficits secondary to incomplete nontraumatic quadriplegia due to metastatic breast cancer              -patient may  shower             -ELOS/Goals: 12 to 14 days, PT OT min assist         Con't CIR PT and OT  Set d/c for 1/31 before starts other radiation.   Con't CIR PT and OT- team conference today to finalize d/c  Needs handicapped placard 2.  Antithrombotics: -DVT/anticoagulation:  Pharmaceutical: Lovenox 1/19- the Dopplers are negative  1/30- walking well- but a set up for DVTs/VTE's due to weakness due to SCI AND cancer dx- so I will switch to Eliquis 2.5 mg BID x additional 45 days so will get a total of 2 months- Oncology can lengthen if need be.              -antiplatelet therapy: N/A 3. Pain Management:  Low dose oxycodone, tylenol 650 every 4 hours as needed and flexeril prn --On gabapentin 100 mg 3 times daily for  neuropathy.  1/18- pain controlled right now- con't regimen and monitor while doing therapy 1/20- al level SCI pain- increased gabapentin to 200 mg TID  1/22- shoulder and hip pain better with muscle relaxants- and nerve pain better with increase in gabapentin- con't regimen and monitor 1/24- changed Oxy to Norco- suggested taking before leaves for  radiation 1/25- pain better with Norco 1/29-pain controlled- will need pain meds after d/c- 7 days of meds and then can refill for her unless Oncology wants to prescribe.  4. Mood/Behavior/Sleep: LCSW to follow for evaluation and support.             --trazodone prn for insomnia. Continue ativan prn for anxiety.  Continue Celexa for mood.  1/24 - due to increased anxiety, suggested taking Benzo before leaves and will increase Celexa to 40 mg daily  1/29- anxiety doing a little better             -antipsychotic agents: N/A 5. Neuropsych/cognition: This patient is capable of making decisions on her own behalf. 6. Skin/Wound Care: removed operative dressing today. No further dressing needed. 7. Fluids/Electrolytes/Nutrition: Monitor I/O. Check CMET in am. 8. Breast cancer with mets to brain/spine (liver/lung?): To start XRT 01/24 per chart review             --abnormal LFTs with A [id number]-[id number]  1/18- Brain MRI showed lesions 1-2+ mm worse and 2 new lesions- concerning, but starts radiation 1/24 while in rehab  1/19- spoke with SW- will move appointments to afternoon so can participate in therapy first.   1/23- d/w pt- so starts tomorrow   1/25- Did well with radiation- caused some dizziness later on, but went well with Ativan and Norco.   1/28 still limited in activity tolerance at times d/t dizziness/headaches  1/30 restarted Radiation of neck on 2/1             -Followed by Dr. Truitt Merle oncology 9. Hyponatremia: Mild with sodium at 134.  Recheck lytes in am for follow up 10. Leucocytosis: WBC 13.4 on 01/14--> repeat CBC in am.              --Monitor for fevers and other signs of infection.   1/18- WBC down to 9.3- doing better 11. High blood pressure: SBP 140-150 range. Monitor for trends and with activity. .  --Will order orthostatic vitals as dizziness reported with activity 09/07/17- - BP's still [id number] systolic- don't want to add another medicine right now- will wait to add meds.   1/20-  don't want to add more meds, esp because SCI pts have a tendency to orthostatic hypotension Vitals:   09/25/22 1952 09/26/22 0449  BP: (!) 122/56 130/73  Pulse: 95 95  Resp: 17 16  Temp: 98.8 F (37.1 C) 98.4 F (36.9 C)  SpO2: 97% 97%    1/29- BP controlled- not too low or high- con't regimen 12. Neurogenic bowel- on bowel program: Had BM with use of enema and none for 2-3 days.  --Dose of miralax again today followed by suppository after supper.  -Dulcolax suppository daily, MiraLAX as needed, Senokot daily, sorbitol as needed  1/18- small BM with bowel program last night- con't regimen 1/19- Small BM last night- since soft, won't increase Colace, but will increase Senna to 1 tab BID 1/20- LBM overnight with suppository 1/22- having BM's- with suppository and 1 this AM- good sized, soft and on bedpan.  1/28 continues to move bowels on her own  1/29- 2 Bms in last 24 hours 13. Neurogenic bladder: Did have incontinence at admission that's better but has been using purewick and reports that she's getting up wet.              --monitor voiding with PVR/bladder scans. Check UA. 1/18- U/A negative- is voiding some with urinal- will con't PVRs   1/20- voiding with urinal- hasn't needed cathing 1/22- voiding with urinal- asking if can get up to [facility] soon- will d/w therapy 1/28 improved emptying 14 hypothyroidism.  Continue Synthroid 75 mcg daily. 15. At level SCI pain- is actually in thoracic area, but is textbook- will increase gabapentin to 200 mg TID and monitor pain issues- explained will never go away,  but can get better  1/22- pain is doing a little better with increase in gabapentin 16. GERD  1/22- will add Protonix 40 mg daily for reflux Sx's- Tums didn't work.  17. Dry mouth- due to meds/environment  1/23- will add biotene mouthwash for pt. Is prn  1/26- says it's doing better- con't regimen    I spent a total of 39   minutes on total care today- >50% coordination of care- due  to  Team conference as well as d/w PA about Eliquis vs Lovenox    !Will need handicapped placard!  LOS: 13 days A FACE TO FACE EVALUATION WAS PERFORMED  Martha [name] 09/26/2022, 8:14 AM

## 2022-09-26 NOTE — Progress Notes (Signed)
Occupational Therapy Session Note  Patient Details  Name: Martha [name] MRN: [id number] Date of Birth: 1960/11/25  Today's Date: 09/26/2022 OT Individual Time: 1130-1200 OT Individual Time Calculation (min): 30 min    Short Term Goals: Week 2:  OT Short Term Goal 1 (Week 2): STG=LTG 2/2 ELOS (continue working towareds supervision LTGs)  Skilled Therapeutic Interventions/Progress Updates:    Pt resting in bed upon arrival and reports that she is feeling better. OT intervention with focus on hand strengthening and [place]. Table tasks with tan theraputty-removing beads, pulling taffy, isolated finger strengthening. Pt with continued RUE numbness distally. Pt returned to room and requested to return to bed. Stand pivot transfer with supervision. Bed mobility with supervision.  Therapy Documentation Precautions:  Precautions Precautions: Cervical, Fall Precaution Comments: verbally reviewed and pt able to recall with minimal cueing Required Braces or Orthoses: Cervical Brace Cervical Brace: Hard collar Restrictions Weight Bearing Restrictions: No   Pain: Pain Assessment Pain Scale: 0-10 Pain Score: 0-No pain     Therapy/Group: Individual Therapy  Leroy Libman 09/26/2022, 12:07 PM

## 2022-09-26 NOTE — Progress Notes (Signed)
Inpatient Rehabilitation Care Coordinator Discharge Note   Patient Details  Name: Martha [name] MRN: [id number] Date of Birth: 07/29/1961   Discharge location: D/c to home with husband and support from friends during the day  Length of Stay: 14 days  Discharge activity level: Supervision  Home/community participation: Limited  Patient response XH:BZJIRC Literacy - How often do you need to have someone help you when you read instructions, pamphlets, or other written material from your doctor or pharmacy?: Never  Patient response VE:LFYBOF Isolation - How often do you feel lonely or isolated from those around you?: Rarely  Services provided included: MD, RD, PT, OT, RN, CM, TR, Pharmacy, Neuropsych, SW  Financial Services:  Charity fundraiser Utilized: [place] offered to/list presented to: patient [phone]  Follow-up services arranged:  Outpatient, DME    Outpatient Servicies: [facility] for outpatient PT/OT DME : [place] [facility]in[facility]    Patient response to transportation need: Is the patient able to respond to transportation needs?: Yes In the past 12 months, has lack of transportation kept you from medical appointments or from getting medications?: No In the past 12 months, has lack of transportation kept you from meetings, work, or from getting things needed for daily living?: No   Comments (or additional information):  Patient/Family verbalized understanding of follow-up arrangements:  Yes  Individual responsible for coordination of the follow-up plan: contact pt  Confirmed correct DME delivered: Rana Snare 09/26/2022    Rana Snare

## 2022-09-26 NOTE — Progress Notes (Addendum)
Physical Therapy Discharge Summary  Patient Details  Name: Martha [name] MRN: [id number] Date of Birth: [birth year]  Date of Discharge from PT service:September 26, 2022  Today's Date: 09/26/2022 PT Individual Time: 1300-1353 PT Individual Time Calculation (min): 53 min  and Today's Date: 09/26/2022 PT Missed Time: 15 Minutes Missed Time Reason: Patient fatigue;Pain   Patient has met 6 of 6 long term goals due to improved activity tolerance, improved balance, improved postural control, increased strength, ability to compensate for deficits, and improved coordination.  Patient to discharge at an ambulatory level Supervision.   Patient's care partner is independent to provide the necessary physical assistance at discharge.Pt's husband, Martha [name], participated in hands on family education.  Reasons goals not met: NA  Recommendation:  Patient will benefit from ongoing skilled PT services in outpatient setting to continue to advance safe functional mobility, address ongoing impairments in balance, strength, coordination, gait mechanics, and minimize fall risk.  Equipment: Custom W/c eval performed but pt requesting to hold at this time and will complete process at later date if required.   Reasons for discharge: treatment goals met and discharge from hospital  Patient/family agrees with progress made and goals achieved: Yes  Skilled Therapeutic Interventions/Progress Updates:  pt received in bed and agreeable to therapy. Pt reports moderate to severe pain which coincided with increased anxiety, recd pain medication from nsg at end of session. Session focused on d/c assessment as detailed below. Pt performed stair navigation 6" steps x 4 with BUE on L hand rail with CGA. Unlevel surfaces with CGA and verbal cues. Pt instructed on and performed floor transfer with large spike in anxiety and pain. Time spent on education and d/c planning throughout session. Pt requested to end session early d/t  pain and fatigue. Missed 15 min d/t pain fatigue. Pt returned to bed after session and was left with all needs in reach and alarm active.   PT Discharge Precautions/Restrictions Precautions Precautions: Cervical;Fall Precaution Booklet Issued: No Precaution Comments: verbally reviewed and pt able to recall with minimal cueing Required Braces or Orthoses: Cervical Brace Restrictions Weight Bearing Restrictions: No  Pain   Pain Interference Pain Interference Pain Effect on Sleep: 1. Rarely or not at all Pain Interference with Therapy Activities: 3. Frequently Pain Interference with Day-to-Day Activities: 1. Rarely or not at all Vision/Perception  Vision - History Ability to See in Adequate Light: 0 Adequate Perception Perception: Within Functional Limits Praxis Praxis: Intact  Cognition Overall Cognitive Status: Within Functional Limits for tasks assessed Arousal/Alertness: Awake/alert Orientation Level: Oriented X4 Year: 2024 Month: January Day of Week: Correct Attention: Sustained Sustained Attention: Appears intact Memory: Appears intact Awareness: Appears intact Problem Solving: Appears intact Safety/Judgment: Appears intact Sensation Sensation Light Touch: Appears Intact Hot/Cold: Appears Intact Proprioception: Appears Intact Stereognosis: Not tested Additional Comments: Intact sensation BUE/BLE with assessment however reports numbness/tingling with digits on both Coordination Gross Motor Movements are Fluid and Coordinated: No Fine Motor Movements are Fluid and Coordinated: No Coordination and Movement Description: ataxic and decreased balance strategies Finger Nose Finger Test: ataxic bilaterally L>R Motor  Motor Motor: Ataxia Motor - Skilled Clinical Observations: Pt with ataxia LE>UE  Mobility Bed Mobility Bed Mobility: Left Sidelying to Sit;Sitting - Scoot to Edge of Bed;Sit to Sidelying Right Left Sidelying to Sit: Independent with assistive  device Sitting - Scoot to Edge of Bed: Independent with assistive device Sit to Sidelying Right: Independent with assistive device Transfers Transfers: Sit to Stand;Stand Pivot Transfers Sit to Stand: Independent with assistive device;Supervision/Verbal  cueing Stand Pivot Transfers: Independent with assistive device;Supervision/Verbal cueing Transfer (Assistive device): Rolling walker Locomotion  Gait Ambulation: Yes Gait Assistance: Supervision/Verbal cueing Gait Distance (Feet): 190 Feet Assistive device: Rolling walker Gait Gait: Yes Gait Pattern: Impaired Gait Pattern: Ataxic Gait velocity: decreased Stairs / Additional Locomotion Stairs: Yes Stairs Assistance: Contact Guard/Touching assist Stair Management Technique: One rail Left Number of Stairs: 4 Height of Stairs: 6 Ramp: Contact Guard/touching assist Curb: Contact Guard/Touching assist Pick up small object from the floor assist level: Contact Guard/Touching assist Pick up small object from the floor assistive device: RW Wheelchair Mobility Wheelchair Mobility: Yes Wheelchair Assistance: Independent with Camera operator: Both upper extremities Wheelchair Parts Management: Independent Distance: 500 ft  Trunk/Postural Assessment  Cervical Assessment Cervical Assessment: Exceptions to [facility] Thoracic Assessment Thoracic Assessment: Within Functional Limits Lumbar Assessment Lumbar Assessment: Within Functional Limits Postural Control Postural Control: Deficits on evaluation  Balance Balance Balance Assessed: Yes Standardized Balance Assessment Standardized Balance Assessment: Berg Balance Test Berg Balance Test Sit to Stand: Able to stand using hands after several tries Standing Unsupported: Able to stand 2 minutes with supervision Sitting with Back Unsupported but Feet Supported on Floor or Stool: Able to sit safely and securely 2 minutes Stand to Sit: Uses backs of legs against chair to  control descent Transfers: Able to transfer with verbal cueing and /or supervision Standing Unsupported with Eyes Closed: Able to stand 10 seconds with supervision Standing Ubsupported with Feet Together: Able to place feet together independently and stand for 1 minute with supervision From Standing, Reach Forward with Outstretched Arm: Loses balance while trying/requires external support (back precautions; unable to hip hinge for forward reach) From Standing Position, Pick up Object from Floor: Unable to pick up and needs supervision From Standing Position, Turn to Look Behind Over each Shoulder: Turn sideways only but maintains balance (no twist to spine or neck, is able to bend hips/ knees just enough to turn sideways.) Turn 360 Degrees: Able to turn 360 degrees safely but slowly Standing Unsupported, Alternately Place Feet on Step/Stool: Able to stand independently and complete 8 steps >20 seconds Standing Unsupported, One Foot in Front: Able to plae foot ahead of the other independently and hold 30 seconds Standing on One Leg: Tries to lift leg/unable to hold 3 seconds but remains standing independently Total Score: 31 Static Sitting Balance Static Sitting - Balance Support: Feet supported;No upper extremity supported Static Sitting - Level of Assistance: 6: Modified independent (Device/Increase time) Dynamic Sitting Balance Dynamic Sitting - Balance Support: Feet supported;No upper extremity supported;During functional activity Dynamic Sitting - Level of Assistance: 6: Modified independent (Device/Increase time) Static Standing Balance Static Standing - Balance Support: During functional activity;Bilateral upper extremity supported Static Standing - Level of Assistance: 6: Modified independent (Device/Increase time) Dynamic Standing Balance Dynamic Standing - Balance Support: During functional activity;No upper extremity supported;Bilateral upper extremity supported Dynamic Standing -  Level of Assistance: 5: Stand by assistance Extremity Assessment      RLE Assessment RLE Assessment: Exceptions to [facility] General Strength Comments: Grossly 4/5 LLE Assessment LLE Assessment: Exceptions to [facility] General Strength Comments: Grossly 4+/5   Dava Rensch C Shanley Furlough 09/26/2022, 1:57 PM

## 2022-09-26 NOTE — Patient Care Conference (Signed)
Inpatient RehabilitationTeam Conference and Plan of Care Update Date: 09/26/2022   Time: 11:11 AM    Patient Name: Martha [name]      Medical Record Number: [id number]  Date of Birth: 1961/02/16 Sex: Female         Room/Bed: 4W14C/4W14C-01 Payor Info: Payor: [place] / Plan: [employer] COMM PPO / Product Type: *No Product type* /    Admit Date/Time:  09/13/2022  2:20 PM  Primary Diagnosis:  Acute incomplete quadriplegia [facility])  Hospital Problems: Principal Problem:   Acute incomplete quadriplegia ([place]) Active Problems:   Cervical myelopathy [facility])    Expected Discharge Date: Expected Discharge Date: 09/27/22  Team Members Present: Physician leading conference: Dr. Courtney Heys Social Worker Present: Loralee Pacas, [place] Nurse Present: Tacy Learn, RN PT Present: Ailene Rud, PT OT Present: Willeen Cass, OT;Roanna Epley, COTA PPS Coordinator present : Gunnar Fusi, SLP     Current Status/Progress Goal Weekly Team Focus  Bowel/Bladder   Pt is continent of bowel & bladder. No concerns with PVR.   Continue for patient ro remain continent of bowel & bladder.   Continue to assess tolieting needs QShift & PRN.    Swallow/Nutrition/ Hydration               ADL's   bathing-min A; dressing-supervision; functional transers-supervision; standing balance-supervision   bathing-min A; dressing-supervision; toileting-supervision; transfers-supervision   discharge planning, BADLs, safety awareness, educaiton    Mobility   Supervision bed mobility, CGA consistently with gait and transfers, w/c eval performed but pt now unsure about pursuing it at this time.   supervision gait 75 ft, CGA stairs, mod I w/c  gait, NMR for coordination, endurance    Communication                Safety/Cognition/ Behavioral Observations               Pain   Endorses pain with activity. Pain management is improving. Non-medicinal interventions helping such as ice.    Continue to work towards pain goal 3 or less.   Continue to assess pain level QShift and PRN.    Skin   Open air incision to the back of neck.   Continue to assess and cleanse area. Prevent infection and ensure no further breakdown of skin.  Will assess QShift and PRN.      Discharge Planning:  Pt will d/c to home with intermittent support from her husband who works during [place] day. Plans to have family/friends who will stay with her during the day. Fam edu completed on Fri (1/26) 1pm-4pm with her husband. DME ordered- 3in1 [facility]. SW waiting to confirm if specialty w/c needed. Has standard w/c at thome. Waiting on final d/c therapy recs.   Team Discussion: Acute incomplete quadriplegia. Continent B/B with time toileting. Pain improving, managed with ICE. Incision to neck OTA no drainage. Aspen collar OOB. Transitioned to Eliquis today. Family education completed. Patient more appropriate for out patient therapies.  Wheelchair evaluation completed but patient would like to wait before getting.  Patient on target to meet rehab goals: yes, patient on track for discharge.   *See Care Plan and progress notes for long and short-term goals.   Revisions to Treatment Plan:  Monitor labs.   Teaching Needs: Medications, safety, self care, gait/transfer training, skin care, etc.   Current Barriers to Discharge: Decreased caregiver support  Possible Resolutions to Barriers: Family education completed, nursing education, order recommended DME     Medical Summary  Current Status: Changed lovenox to Elqiuis- LBM this AM; has cervical collar- continent of B/B;  Barriers to Discharge: Inadequate Nutritional Intake;Neurogenic Bowel & Bladder;Self-care education;Pending chemo/radiation  Barriers to Discharge Comments: changed to Eliquis vs Lovenox; willl need more 6 more weeks- educated pt on DVT risk. Possible Resolutions to [employer]: walking >100 ft- so doesn't want w/c right now; will  send home on Norco prn-can prescribe- d/c tomorrow   Continued Need for Acute Rehabilitation Level of Care: The patient requires daily medical management by a physician with specialized training in physical medicine and rehabilitation for the following reasons: Direction of a multidisciplinary physical rehabilitation program to maximize functional independence : Yes Medical management of patient stability for increased activity during participation in an intensive rehabilitation regime.: Yes Analysis of laboratory values and/or radiology reports with any subsequent need for medication adjustment and/or medical intervention. : Yes   I attest that I was present, lead the team conference, and concur with the assessment and plan of the team.   Ernest Pine 09/26/2022, 3:11 PM

## 2022-09-26 NOTE — Discharge Summary (Signed)
Physician Discharge Summary  Patient ID: Martha [name] MRN: [id number] DOB/AGE: 02-May-1961 62 y.o.  Admit date: 09/13/2022 Discharge date: 09/27/2022  Discharge Diagnoses:  Principal Problem:   Acute incomplete quadriplegia ([place]) Active Problems:   Hypothyroidism   Nicotine dependence with current use   Cancer, metastatic to bone [facility])   Secondary cancer of brain ([place])   Elevated alkaline phosphatase level   Cervical myelopathy ([name])   Discharged Condition: stable  Significant Diagnostic Studies: MR BRAIN W WO CONTRAST  Addendum Date: 09/15/2022   ADDENDUM REPORT: 09/15/2022 13:25 ADDENDUM: An additional 5 mm lesion is seen in the left cerebellar hemisphere, annotated on series 1100, image 118, measuring 5 mm (4 mm on prior). Electronically Signed   By: Pedro Earls M.D.   On: 09/15/2022 13:25   Result Date: 09/15/2022 CLINICAL DATA:  Metastatic disease evaluation; 3T SRS Protocol for radiation treatment planning. EXAM: MRI HEAD WITHOUT AND WITH CONTRAST TECHNIQUE: Multiplanar, multiecho pulse sequences of the brain and surrounding structures were obtained without and with intravenous contrast. CONTRAST:  65m GADAVIST GADOBUTROL 1 MMOL/ML IV SOLN COMPARISON:  MRI of the brain August 25, 2022; MRI of the cervical spine January 9 24. FINDINGS: Brain: A total of 22 enhancing lesions are identified in the current study, annotated on series 1100. New lesions are marked with double arrows. New lesions: Right cerebellar hemisphere, 1 mm, image 122. Left cerebellar hemisphere, 1 mm, image 139 Enlarged lesions: Left cerebellar floccule, 12 mm (10 mm on prior), image 127 Right cerebellar hemisphere, 5 mm (3 mm on prior), image 122 Right occipital lobe: 4 mm (3 mm on prior), image 146 Left occipital lobe, 3 mm (2 mm on prior), image 159 Left occipital lobe, 7 mm (5 mm on prior), image 161 Left frontal operculum, 2 mm (1 mm on prior) image 172 Left occipital lobe, 2 mm (1 mm on  prior), image 173 Right subinsular region, 5 mm (3 mm on prior) image 176 Right posterior temporal, 3 mm) 1 mm on prior), image 177 Left anterior cingulate, 3 mm (2 mm on prior), image 193 Left posterior frontal, 2.5 mm (1.5 mm on prior) image 195 Right parietal, 4 mm (3 mm on prior), image 196 Left parietal, 1.5 mm (less than 1 mm on prior), image 200 Left frontal lobe, 2 mm (less than 1 mm on prior), image 203 Left parietal lobe, 2 mm (less than 1 mm on prior), image 213 Right frontal lobe, 1.5 mm (less than 1 mm on prior), image 222 Unchanged lesions: Right cerebellar hemisphere, 1 mm, image 122 Right temporal lobe, 3 mm, image 145 Left posterior temporal, 3 mm, image 184 Left periventricular, 2 mm, image 192 Mild surrounding edema is seen associated with the the 7 mm medial left occipital lobe lesion. No significant vasogenic edema associated with the other lesions. No acute infarct, hydrocephalus or extra-axial collection. Vascular: Normal flow voids. Skull and upper cervical spine: Diffuse metastatic disease to the cervical spine to the cervical spine and occipital condyles, better evaluated prior MRI of the cervical spine. Multiple enhancing metastatic lesions are also seen scattered in the calvarium. Sinuses/Orbits: Negative. Other: None. IMPRESSION: Findings consistent with disease progression with mild increase in size of many lesions and development of 2 new lesions, as detailed above. Electronically Signed: By: KPedro EarlsM.D. On: 09/13/2022 14:15   VAS [place] EXTREMITY VENOUS (DVT)  Result Date: 09/13/2022  Lower Venous DVT Study Patient Name:  MJAMAE [name] Date of  Exam:   09/13/2022 Medical Rec #: [id number]              Accession #:    [id number] Date of Birth: [birth year]              Patient Gender: F Patient Age:   62 years Exam Location:  [facility] Procedure:      VAS [place] LOWER EXTREMITY VENOUS (DVT) Referring Phys: Marcelus Dubberly  --------------------------------------------------------------------------------  Indications: Cervical myelopathy.  Risk Factors: None identified. Comparison Study: No prior studies. Performing Technologist: Oliver Hum RVT  Examination Guidelines: A complete evaluation includes B-mode imaging, spectral Doppler, color Doppler, and power Doppler as needed of all accessible portions of each vessel. Bilateral testing is considered an integral part of a complete examination. Limited examinations for reoccurring indications may be performed as noted. The reflux portion of the exam is performed with the patient in reverse Trendelenburg.  +---------+---------------+---------+-----------+----------+--------------+ RIGHT    CompressibilityPhasicitySpontaneityPropertiesThrombus Aging +---------+---------------+---------+-----------+----------+--------------+ CFV      Full           Yes      Yes                                 +---------+---------------+---------+-----------+----------+--------------+ SFJ      Full                                                        +---------+---------------+---------+-----------+----------+--------------+ FV Prox  Full                                                        +---------+---------------+---------+-----------+----------+--------------+ FV Mid   Full                                                        +---------+---------------+---------+-----------+----------+--------------+ FV DistalFull                                                        +---------+---------------+---------+-----------+----------+--------------+ PFV      Full                                                        +---------+---------------+---------+-----------+----------+--------------+ POP      Full           Yes      Yes                                 +---------+---------------+---------+-----------+----------+--------------+ PTV       Full                                                        +---------+---------------+---------+-----------+----------+--------------+  PERO     Full                                                        +---------+---------------+---------+-----------+----------+--------------+   +---------+---------------+---------+-----------+----------+--------------+ LEFT     CompressibilityPhasicitySpontaneityPropertiesThrombus Aging +---------+---------------+---------+-----------+----------+--------------+ CFV      Full           Yes      Yes                                 +---------+---------------+---------+-----------+----------+--------------+ SFJ      Full                                                        +---------+---------------+---------+-----------+----------+--------------+ FV Prox  Full                                                        +---------+---------------+---------+-----------+----------+--------------+ FV Mid   Full                                                        +---------+---------------+---------+-----------+----------+--------------+ FV DistalFull                                                        +---------+---------------+---------+-----------+----------+--------------+ PFV      Full                                                        +---------+---------------+---------+-----------+----------+--------------+ POP      Full           Yes      Yes                                 +---------+---------------+---------+-----------+----------+--------------+ PTV      Full                                                        +---------+---------------+---------+-----------+----------+--------------+ PERO     Full                                                        +---------+---------------+---------+-----------+----------+--------------+  Summary: RIGHT: - There is no evidence of deep vein  thrombosis in the lower extremity.  - No cystic structure found in the popliteal fossa.  LEFT: - There is no evidence of deep vein thrombosis in the lower extremity.  - No cystic structure found in the popliteal fossa.  *See table(s) above for measurements and observations. Electronically signed by Harold Barban MD on 09/13/2022 at 8:33:01 PM.    Final     Labs:  Basic Metabolic Panel:    Latest Ref Rng & Units 09/26/2022    6:02 AM 09/14/2022    6:22 AM 09/10/2022   10:56 AM  BMP  Glucose 70 - 99 mg/dL 104  107    BUN 8 - 23 mg/dL 21  11    Creatinine 0.44 - 1.00 mg/dL 0.44  0.38  <0.30   Sodium 135 - 145 mmol/L 135  131    Potassium 3.5 - 5.1 mmol/L 3.9  3.3    Chloride 98 - 111 mmol/L 99  95    CO2 22 - 32 mmol/L 29  26    Calcium 8.9 - 10.3 mg/dL 10.1  9.1       CBC:    Latest Ref Rng & Units 09/25/2022    6:07 AM 09/18/2022    5:37 AM 09/14/2022    6:22 AM  CBC  WBC 4.0 - 10.5 K/uL 6.1  8.3  9.3   Hemoglobin 12.0 - 15.0 g/dL 13.3  13.9  13.8   Hematocrit 36.0 - 46.0 % 38.0  39.7  39.0   Platelets 150 - 400 K/uL 242  240  265      CBG: No results for input(s): "GLUCAP" in the last 168 hours.  Brief HPI:   Martha [name] is a 62 y.o. female with history of breast cancer in the past and few month history of neck pain with work up revealing extensive metastatic disease with pathological fracture at base of dens and C2 right lateral mass with extraosseous tumor at C1 and C2.  She was referred to Dr. Marcello Moores and PET scan done revealing large volume osseous mets with suspicion of hepatic mets as well as brain mets on MRI.  She underwent C1-C4 fusion with reduction of C2 fracture and bone biopsy on 12/21 and was discharged to home with plans to start XRT.  Postop has had some issues with spasms treated with steroids and gabapentin.  She was admitted on 01//24 with increased numbness and weakness in hands progressing to inability to walk as well as decrease sensation from waist down.     Workup revealed mildly progressive stenosis C5 and C6 with cord flattening as well as possible lung mets.  Case was discussed with Dr. Annamaria Boots and Dr. Lanell Persons and patient elected on aggressive treatment.  She underwent laminectomy C4-C7 with extension of previous augmentation arthrodesis on 01/10.  She had improvement in sensation BUE as well as strength and reported issues with constipation as well as bladder incontinence.  PT/OT was working with patient who continued to be limited by ataxia with sensory deficits, pain, fatigue, posterior lean, dizziness as well as decrease in control of BLE.  CIR was recommended due to functional decline.   Hospital Course: Kasey Hansell was admitted to rehab 09/13/2022 for inpatient therapies to consist of PT, ST and OT at least three hours five days a week. Past admission physiatrist, therapy team and rehab RN have worked together to provide customized collaborative inpatient rehab. Her bBlood pressures were  monitored on TID basis and has been stable, BLE dopplers done past admission and were negative for DVT. She was started on low dose eliquis on 01/30 and recommend at least 6 weeks course for DVT prophylaxis. Follow up labs showed hyponatremia has resolved and LFTs are improving with downward trend in A phos. Follow up MRI brain done 01/18 showing 2 new lesions and mild increase in some lesions. She underwent radiotactic radiosurgery on 01/24 by Dr. Marcello Moores and post procedure  had some issues with dizziness as well as pain managed with prn use oxycodone.  Celexa was increased to 40 mg daily to help manage anxiety.  She was started on bowel program to manage neurogenic bowel and laxative have been titrated with good results. Bowel function has improved and suppository was discontinued on 01/25.  She was started on toileting program with PVR checks. She is voiding without difficulty and incontinence has resolved. Gabapentin was titrated up to 200 mg TID for SCI  pain. She has made good gains during her stay but continues to be limited by ataxia, dizziness and headaches. Supervision is recommended at discharge. She will continue to receive follow up outpatient PT and OT at [facility] outpatient rehab after discharge.    Rehab course: During patient's stay in rehab weekly team conferences were held to monitor patient's progress, set goals and discuss barriers to discharge. At admission, patient required max assist with basic ADL tasks and mod assist with mobility. She  has had improvement in activity tolerance, balance, postural control as well as ability to compensate for deficits. She has had improvement in functional use RUE/LUE  and RLE/LLE as well as improvement in awareness. She requires min assist with bathing, supervision with dressing and personal hygiene. She requires supervision with cues for mobility and to ambulate 190' with RW. She requires CGA to climb 4 stairs. Family education has been completed.     Disposition: Home  Diet: Regular.   Special Instructions: Eliquis added on 09/26/22 and recommend at least 6 week DVT prophylaxis due to SCI/metastatic cancer hx. Wear collar when out of bed.   Allergies as of 09/26/2022       Reactions   Morphine And Related Other (See Comments)   "Makes her angry"        Medication List     STOP taking these medications    oxyCODONE-acetaminophen 5-325 MG tablet Commonly known as: Percocet       TAKE these medications    apixaban 2.5 MG Tabs tablet Commonly known as: ELIQUIS Take 1 tablet (2.5 mg total) by mouth 2 (two) times daily. Notes to patient: For 6 weeks total   ascorbic acid 1000 MG tablet Commonly known as: VITAMIN C Take 1 tablet (1,000 mg total) by mouth daily.   citalopram 40 MG tablet Commonly known as: CELEXA Take 1 tablet (40 mg total) by mouth daily. What changed:  medication strength how much to take   cyclobenzaprine 10 MG tablet Commonly known as:  FLEXERIL Take 1 tablet (10 mg total) by mouth 3 (three) times daily as needed for muscle spasms.   gabapentin 100 MG capsule Commonly known as: Neurontin Take 2 capsules (200 mg total) by mouth 3 (three) times daily. What changed: how much to take   HYDROcodone-acetaminophen 5-325 MG tablet Commonly known as: NORCO/VICODIN Take 1 tablet by mouth every 4 (four) hours as needed for severe pain.   letrozole 2.5 MG tablet Commonly known as: FEMARA Take 2.5 mg by mouth daily.  levothyroxine 75 MCG tablet Commonly known as: SYNTHROID Take 1 tablet (75 mcg total) by mouth daily before breakfast.   mouth rinse Liqd solution 15 mLs by Mouth Rinse route as needed (for oral care).   OVER THE COUNTER MEDICATION Take 1 tablet by mouth daily. Protandim Supplement   pantoprazole 40 MG tablet Commonly known as: PROTONIX Take 1 tablet (40 mg total) by mouth daily.   PROBIOTIC PO Take 1 capsule by mouth daily.   senna-docusate 8.6-50 MG tablet Commonly known as: Senokot-S Take 1 tablet by mouth 2 (two) times daily. Notes to patient: Pick up OTC   traZODone 50 MG tablet Commonly known as: DESYREL Take 0.5-1 tablets (25-50 mg total) by mouth at bedtime as needed for sleep.   TYLENOL 500 MG tablet Generic drug: acetaminophen Take 500 mg by mouth every 6 (six) hours as needed for mild pain or headache.        Follow-up Information     [name], MD Follow up.   Specialty: Family Medicine Why: Call in 1-2 days for post hospital follow up Contact information: [place] [address] [phone]         Courtney Heys, MD Follow up.   Specialty: Physical Medicine and Rehabilitation Why: office will call you with follow up appointment Contact information: [address] [place] [address] [phone]         [facility] Follow up.   Why: office will call you with follow up appointment Contact information: [place] [address] [phone]        Truitt Merle, MD Follow up.   Specialties: Hematology, Oncology Why: Call in 1-2 days for post hospital follow up Contact information: [place] [address] [phone]         Vallarie Mare, MD Follow up.   Specialty: Neurosurgery Why: Call in 1-2 days for post hospital follow up Contact information: [address] [place]  [address] [phone]                 Signed: Bary Leriche 09/26/2022, 4:25 PM

## 2022-09-26 NOTE — Progress Notes (Signed)
Patient ID: Martha [name], female   DOB: 1961-03-16, 61 y.o.   MRN: [id number]  SW went by pt room to provide updates from team conference, but pt not in room. SW will attempt later.   *SW met with pt in room to review discharge as team conference updates focused on this as well. She has 3in1 BSC and item was taken home yesterday. SW discussed preference of outpatient location. Prefers [facility], however, SW informed this location does not have PT and OT. Amenable to [facility] location. She is aware SW will follow-up with her husband.   [id number]-SW spoke with pt husband to discuss above. No additional/questions concerns reported.   *SW faxed outpatient PT/OT referral [facility]   Loralee Pacas, MSW, [place] Office: [phone] Cell: [phone] Fax: [phone]

## 2022-09-27 ENCOUNTER — Ambulatory Visit: Payer: BC Managed Care – PPO | Admitting: Radiation Oncology

## 2022-09-27 ENCOUNTER — Other Ambulatory Visit (HOSPITAL_COMMUNITY): Payer: Self-pay

## 2022-09-27 ENCOUNTER — Ambulatory Visit: Payer: BC Managed Care – PPO

## 2022-09-27 ENCOUNTER — Other Ambulatory Visit: Payer: Self-pay | Admitting: Radiation Therapy

## 2022-09-27 DIAGNOSIS — C7931 Secondary malignant neoplasm of brain: Secondary | ICD-10-CM | POA: Diagnosis not present

## 2022-09-27 DIAGNOSIS — C50312 Malignant neoplasm of lower-inner quadrant of left female breast: Secondary | ICD-10-CM | POA: Diagnosis not present

## 2022-09-27 DIAGNOSIS — C7951 Secondary malignant neoplasm of bone: Secondary | ICD-10-CM | POA: Diagnosis not present

## 2022-09-27 DIAGNOSIS — Z510 Encounter for antineoplastic radiation therapy: Secondary | ICD-10-CM | POA: Diagnosis not present

## 2022-09-27 DIAGNOSIS — Z170 Estrogen receptor positive status [ER+]: Secondary | ICD-10-CM | POA: Diagnosis not present

## 2022-09-27 NOTE — Progress Notes (Signed)
Recreational Therapy Session Note  Patient Details  Name: Martha [name] MRN: [id number] Date of Birth: [birth year] Today's Date: 09/27/2022 LATE ENTRY for 09/22/22 Pain: no c/o Skilled Therapeutic Interventions/Progress Updates: Session focused on stress management/coping education.  Pt seated in w/c upon arrival, agreeable to session.    Goals:   After discussion , pti will:  -identify factors that contribute to stress with min questioning cues.  MET  -identify factors that protect against stress with min questioning cues.   MET  Pt education/discussion focused on stress exploration including factors that contribute to stress, factors that protect against stress and potential coping strategies.  Coping strategies included deep breathing, progressive muscle relaxation, imagery & challenging irrational thoughts.  Pt was familiar with a few of these coping strategies from previous use.  Pt stated she found this session very helpful.  Handouts provided.  Therapy/Group: Individual Therapy  Melville Engen 09/27/2022, 8:34 AM

## 2022-09-27 NOTE — Progress Notes (Signed)
Recreational Therapy Discharge Summary Patient Details  Name: Martha [name] MRN: [id number] Date of Birth: 1961/07/26 Today's Date: 09/27/2022   Comments on progress toward goals: Pt made good progress during LOS and is discharging home with husband to provide/coordinate the needed supervision/assistance.  TR sessions focused on pt education in regards to activity analysis identifying potential modifications, stress management/coping, importance of social, emotional, spiritual health in addition to physical health and their impact on each other and quality of life.  Pt is excited to return home and anxious to return to previously enjoyed activities as able.  Reasons for discharge: discharge from hospital  Follow-up: Outpatient  Patient/family agrees with progress made and goals achieved: Yes  Tysean Vandervliet 09/27/2022, 3:54 PM

## 2022-09-27 NOTE — Progress Notes (Signed)
PROGRESS NOTE   Subjective/Complaints:  Pt ready for d/c today.  Ate <25% of meal, but craving pizza Got all equipment To do [facility] for outpt therapies.     ROS:   Pt denies SOB, abd pain, CP, N/V/C/D, and vision changes   Objective:   No results found. Recent Labs    09/25/22 0607  WBC 6.1  HGB 13.3  HCT 38.0  PLT 242    Recent Labs    09/26/22 0602  NA 135  K 3.9  CL 99  CO2 29  GLUCOSE 104*  BUN 21  CREATININE 0.44  CALCIUM 10.1     Intake/Output Summary (Last 24 hours) at 09/27/2022 0848 Last data filed at 09/26/2022 1300 Gross per 24 hour  Intake 240 ml  Output --  Net 240 ml        Physical Exam: Vital Signs Blood pressure (!) 146/73, pulse 94, temperature (!) 97.5 F (36.4 C), temperature source Oral, resp. rate 17, height 5' 2.99" (1.6 m), weight 40.3 kg, SpO2 97 %.             General: awake, alert, appropriate, sitting up in bed; NAD HENT: conjugate gaze; oropharynx moist- cervical collar in place CV: regular rate; no JVD Pulmonary: CTA B/L; no W/R/R- good air movement GI: soft, NT, ND, (+)BS Psychiatric: appropriate- bright affect Neurological: Ox3  Ext: no clubbing, cyanosis, or edema Psych: pleasant and cooperative   Skin: Posterior neck incision C/D/I. Neuro:  Alert and appropriate without dysarthria, speech clear with normal rate.  Bilateral ataxia worse on right.  Altered sensation to light touch primarily in the hands and feet bilaterally.  Cranial nerves II through XII intact, Right upper extremity shoulder abduction 1-2, elbow flexion 4- out of 5, elbow extension 2 out of 5, finger flexion 4-/5 Left upper extremity shoulder abduction 1-2, elbow flexion 4- out of 5, elbow extension 1-2 out of 5, finger flexion 4-/5 Left lower extremity strength 4 out of 5, right lower extremity strength 4- out of 5 Musculoskeletal: No joint swelling or tenderness noted. In  cervical collar      Assessment/Plan: 1. Functional deficits which require 3+ hours per day of interdisciplinary therapy in a comprehensive inpatient rehab setting. Physiatrist is providing close team supervision and 24 hour management of active medical problems listed below. Physiatrist and rehab team continue to assess barriers to discharge/monitor patient progress toward functional and medical goals  Care Tool:  Bathing    Body parts bathed by patient: Right arm, Left arm, Chest, Abdomen, Front perineal area, Right upper leg, Left upper leg, Right lower leg, Left lower leg, Face   Body parts bathed by helper: Buttocks, Right arm, Left arm, Right lower leg, Left lower leg     Bathing assist Assist Level: Supervision/Verbal cueing     Upper Body Dressing/Undressing Upper body dressing   What is the patient wearing?: Pull over shirt, Orthosis    Upper body assist Assist Level: Supervision/Verbal cueing    Lower Body Dressing/Undressing Lower body dressing      What is the patient wearing?: Pants, Underwear/pull up     Lower body assist Assist for lower body dressing: Supervision/Verbal cueing  Toileting Toileting    Toileting assist Assist for toileting: Supervision/Verbal cueing     Transfers Chair/bed transfer  Transfers assist     Chair/bed transfer assist level: Supervision/Verbal cueing     Locomotion Ambulation   Ambulation assist      Assist level: Supervision/Verbal cueing Assistive device: Walker-rolling Max distance: 190   Walk 10 feet activity   Assist     Assist level: Supervision/Verbal cueing Assistive device: Walker-rolling   Walk 50 feet activity   Assist    Assist level: Supervision/Verbal cueing Assistive device: Walker-rolling    Walk 150 feet activity   Assist Walk 150 feet activity did not occur: Safety/medical concerns  Assist level: Supervision/Verbal cueing Assistive device: Walker-rolling    Walk 10  feet on uneven surface  activity   Assist Walk 10 feet on uneven surfaces activity did not occur: Safety/medical concerns   Assist level: Contact Guard/Touching assist Assistive device: Walker-rolling   Wheelchair     Assist Is the patient using a wheelchair?: Yes Type of Wheelchair: Manual    Wheelchair assist level: Independent Max wheelchair distance: 500 ft    Wheelchair 50 feet with 2 turns activity    Assist        Assist Level: Independent   Wheelchair 150 feet activity     Assist      Assist Level: Independent   Blood pressure (!) 146/73, pulse 94, temperature (!) 97.5 F (36.4 C), temperature source Oral, resp. rate 17, height 5' 2.99" (1.6 m), weight 40.3 kg, SpO2 97 %.  Medical Problem List and Plan: 1. Functional deficits secondary to incomplete nontraumatic quadriplegia due to metastatic breast cancer              -patient may  shower             -ELOS/Goals: 12 to 14 days, PT OT min assist         Con't CIR PT and OT  Set d/c for 1/31 before starts other radiation.   Con't CIR PT and OT- team conference today to finalize d/c  Needs handicapped placard  1/31- d/c today 2.  Antithrombotics: -DVT/anticoagulation:  Pharmaceutical: Lovenox 1/19- the Dopplers are negative  1/30- walking well- but a set up for DVTs/VTE's due to weakness due to SCI AND cancer dx- so I will switch to Eliquis 2.5 mg BID x additional 45 days so will get a total of 2 months- Oncology can lengthen if need be.               -antiplatelet therapy: N/A 3. Pain Management:  Low dose oxycodone, tylenol 650 every 4 hours as needed and flexeril prn --On gabapentin 100 mg 3 times daily for  neuropathy.  1/18- pain controlled right now- con't regimen and monitor while doing therapy 1/20- al level SCI pain- increased gabapentin to 200 mg TID  1/22- shoulder and hip pain better with muscle relaxants- and nerve pain better with increase in gabapentin- con't regimen and  monitor 1/24- changed Oxy to Norco- suggested taking before leaves for radiation 1/25- pain better with Norco 1/29-pain controlled- will need pain meds after d/c- 7 days of meds and then can refill for her unless Oncology wants to prescribe.  4. Mood/Behavior/Sleep: LCSW to follow for evaluation and support.             --trazodone prn for insomnia. Continue ativan prn for anxiety.  Continue Celexa for mood.  1/24 - due to increased anxiety, suggested taking Benzo before  leaves and will increase Celexa to 40 mg daily  1/29- anxiety doing a little better             -antipsychotic agents: N/A 5. Neuropsych/cognition: This patient is capable of making decisions on her own behalf. 6. Skin/Wound Care: removed operative dressing today. No further dressing needed. 7. Fluids/Electrolytes/Nutrition: Monitor I/O. Check CMET in am. 8. Breast cancer with mets to brain/spine (liver/lung?): To start XRT 01/24 per chart review             --abnormal LFTs with A [id number]  1/18- Brain MRI showed lesions 1-2+ mm worse and 2 new lesions- concerning, but starts radiation 1/24 while in rehab  1/19- spoke with SW- will move appointments to afternoon so can participate in therapy first.   1/23- d/w pt- so starts tomorrow   1/25- Did well with radiation- caused some dizziness later on, but went well with Ativan and Norco.   1/28 still limited in activity tolerance at times d/t dizziness/headaches  1/30 restarted Radiation of neck on 2/1             -Followed by Dr. Truitt Merle oncology 9. Hyponatremia: Mild with sodium at 134.  Recheck lytes in am for follow up 10. Leucocytosis: WBC 13.4 on 01/14--> repeat CBC in am.              --Monitor for fevers and other signs of infection.   1/18- WBC down to 9.3- doing better 11. High blood pressure: SBP 140-150 range. Monitor for trends and with activity. .  --Will order orthostatic vitals as dizziness reported with activity 09/07/17- - BP's still [id number] systolic- don't  want to add another medicine right now- will wait to add meds.   1/20- don't want to add more meds, esp because SCI pts have a tendency to orthostatic hypotension Vitals:   09/26/22 1949 09/27/22 0538  BP: 125/62 (!) 146/73  Pulse: 93 94  Resp: 18 17  Temp: 98.1 F (36.7 C) (!) 97.5 F (36.4 C)  SpO2: 96% 97%    1/29- BP controlled- not too low or high- con't regimen 12. Neurogenic bowel- on bowel program: Had BM with use of enema and none for 2-3 days.  --Dose of miralax again today followed by suppository after supper.  -Dulcolax suppository daily, MiraLAX as needed, Senokot daily, sorbitol as needed  1/18- small BM with bowel program last night- con't regimen 1/19- Small BM last night- since soft, won't increase Colace, but will increase Senna to 1 tab BID 1/20- LBM overnight with suppository 1/22- having BM's- with suppository and 1 this AM- good sized, soft and on bedpan.  1/28 continues to move bowels on her own  1/29- 2 Bms in last 24 hours 13. Neurogenic bladder: Did have incontinence at admission that's better but has been using purewick and reports that she's getting up wet.              --monitor voiding with PVR/bladder scans. Check UA. 1/18- U/A negative- is voiding some with urinal- will con't PVRs   1/20- voiding with urinal- hasn't needed cathing 1/22- voiding with urinal- asking if can get up to [facility] soon- will d/w therapy 1/28 improved emptying 14 hypothyroidism.  Continue Synthroid 75 mcg daily. 15. At level SCI pain- is actually in thoracic area, but is textbook- will increase gabapentin to 200 mg TID and monitor pain issues- explained will never go away, but can get better  1/22- pain is doing a little better with increase  in gabapentin 16. GERD  1/22- will add Protonix 40 mg daily for reflux Sx's- Tums didn't work.  17. Dry mouth- due to meds/environment  1/23- will add biotene mouthwash for pt. Is prn  1/26- says it's doing better- con't regimen    I spent  a total of 31   minutes on total care today- >50% coordination of care- due to  D/w pt about handicapped placard, f/u with me and oncology-also d/w pt about meds and d/c meds    LOS: 14 days A FACE TO FACE EVALUATION WAS PERFORMED  Sye Schroepfer 09/27/2022, 8:48 AM

## 2022-09-27 NOTE — Progress Notes (Signed)
Closed out nursing care plan and nursing education for discharge to home.  

## 2022-09-28 ENCOUNTER — Ambulatory Visit: Payer: BC Managed Care – PPO

## 2022-09-28 ENCOUNTER — Other Ambulatory Visit: Payer: Self-pay

## 2022-09-28 ENCOUNTER — Ambulatory Visit
Admission: RE | Admit: 2022-09-28 | Discharge: 2022-09-28 | Disposition: A | Discharge status: Home / Self Care | Payer: BC Managed Care – PPO | Source: Ambulatory Visit | Attending: Radiation Oncology | Admitting: Radiation Oncology

## 2022-09-28 DIAGNOSIS — C7931 Secondary malignant neoplasm of brain: Secondary | ICD-10-CM | POA: Diagnosis not present

## 2022-09-28 DIAGNOSIS — C7951 Secondary malignant neoplasm of bone: Secondary | ICD-10-CM | POA: Diagnosis not present

## 2022-09-28 DIAGNOSIS — Z170 Estrogen receptor positive status [ER+]: Secondary | ICD-10-CM | POA: Diagnosis not present

## 2022-09-28 DIAGNOSIS — Z510 Encounter for antineoplastic radiation therapy: Secondary | ICD-10-CM | POA: Diagnosis not present

## 2022-09-28 DIAGNOSIS — G959 Disease of spinal cord, unspecified: Secondary | ICD-10-CM | POA: Insufficient documentation

## 2022-09-28 DIAGNOSIS — C50312 Malignant neoplasm of lower-inner quadrant of left female breast: Secondary | ICD-10-CM | POA: Diagnosis not present

## 2022-09-28 LAB — RAD ONC ARIA SESSION SUMMARY
Course Elapsed Days: 8
Plan Fractions Treated to Date: 1
Plan Fractions Treated to Date: 1
Plan Prescribed Dose Per Fraction: 3 Gy
Plan Prescribed Dose Per Fraction: 3 Gy
Plan Total Fractions Prescribed: 10
Plan Total Fractions Prescribed: 10
Plan Total Prescribed Dose: 30 Gy
Plan Total Prescribed Dose: 30 Gy
Reference Point Dosage Given to Date: 3 Gy
Reference Point Dosage Given to Date: 3 Gy
Reference Point Session Dosage Given: 3 Gy
Reference Point Session Dosage Given: 3 Gy
Session Number: 2

## 2022-09-29 ENCOUNTER — Ambulatory Visit: Payer: BC Managed Care – PPO

## 2022-09-29 ENCOUNTER — Other Ambulatory Visit: Payer: Self-pay

## 2022-09-29 ENCOUNTER — Ambulatory Visit
Admission: RE | Admit: 2022-09-29 | Discharge: 2022-09-29 | Disposition: A | Discharge status: Home / Self Care | Payer: BC Managed Care – PPO | Source: Ambulatory Visit | Attending: Radiation Oncology | Admitting: Radiation Oncology

## 2022-09-29 DIAGNOSIS — Z510 Encounter for antineoplastic radiation therapy: Secondary | ICD-10-CM | POA: Diagnosis not present

## 2022-09-29 DIAGNOSIS — C7931 Secondary malignant neoplasm of brain: Secondary | ICD-10-CM | POA: Diagnosis not present

## 2022-09-29 DIAGNOSIS — Z170 Estrogen receptor positive status [ER+]: Secondary | ICD-10-CM | POA: Diagnosis not present

## 2022-09-29 DIAGNOSIS — C7951 Secondary malignant neoplasm of bone: Secondary | ICD-10-CM | POA: Diagnosis not present

## 2022-09-29 DIAGNOSIS — C50312 Malignant neoplasm of lower-inner quadrant of left female breast: Secondary | ICD-10-CM | POA: Diagnosis not present

## 2022-09-29 LAB — RAD ONC ARIA SESSION SUMMARY
Course Elapsed Days: 9
Plan Fractions Treated to Date: 2
Plan Fractions Treated to Date: 2
Plan Prescribed Dose Per Fraction: 3 Gy
Plan Prescribed Dose Per Fraction: 3 Gy
Plan Total Fractions Prescribed: 10
Plan Total Fractions Prescribed: 10
Plan Total Prescribed Dose: 30 Gy
Plan Total Prescribed Dose: 30 Gy
Reference Point Dosage Given to Date: 6 Gy
Reference Point Dosage Given to Date: 6 Gy
Reference Point Session Dosage Given: 3 Gy
Reference Point Session Dosage Given: 3 Gy
Session Number: 3

## 2022-09-30 ENCOUNTER — Encounter: Payer: Self-pay | Admitting: Physical Medicine and Rehabilitation

## 2022-10-01 NOTE — Assessment & Plan Note (Addendum)
-  presented with neck pain in July 2023 -MRI cervical spine on 07/24/2022 showed Extensive osseous metastatic disease with pathologic fracture at the base of dens and C2 right lateral mass. Extraosseous tumor at C1 and C2 likely impinging on the right C2 and C3 nerve roots. -PET scan from 08/03/2022 showed diffuse bone mets  -she underwent cervical laminectomy and fusion by Dr. Marcello Moores on 08/17/2022, biopsy confirmed metastatic breast cancer, ER/PR negative, HER2 positive. -due to cervical cord compression with myelopathy, she underwent a second cervical spine surgery on September 06, 2022, and just completed inpatient rehabitation. -She has started brain radiation. -I discussed systemic treatment, I recommend first-line treatment with chemotherapy and HER2 antibodies.  I recommend weekly Taxol, trastuzumab and Perjeta.  I also discussed other alternative chemo regiment such as docetaxel, due to her recovery from recent 2 surgeries, she will probably tolerate weekly Taxol better.  We also discussed multiple other effective chemotherapy agents and HER2 antibodies, if she progresses on first-line treatment. --Chemotherapy consent: Side effects including but does not not limited to, fatigue, nausea, vomiting, diarrhea, hair loss, neuropathy, fluid retention, renal and kidney dysfunction, neutropenic fever, needed for blood transfusion, bleeding, were discussed with patient in great detail. She agrees to proceed. -The goal of therapy is palliative, for disease control, and prolong her life. -Will continue Zometa every 3 months, calcium and vitamin D supplement.

## 2022-10-01 NOTE — Assessment & Plan Note (Signed)
--  She was diagnosed in 12/2017. She is s/p left breast lumpectomy and adjuvant radiation.  -She started anti-estrogen therapy with letrozole on 05/2018. Tolerating well with no issues -lost f/u after visit in 04/2020 until her recurrence in 07/2022

## 2022-10-02 ENCOUNTER — Encounter: Payer: Self-pay | Admitting: Hematology

## 2022-10-02 ENCOUNTER — Ambulatory Visit
Admission: RE | Admit: 2022-10-02 | Discharge: 2022-10-02 | Disposition: A | Discharge status: Home / Self Care | Payer: BC Managed Care – PPO | Source: Ambulatory Visit | Attending: Radiation Oncology | Admitting: Radiation Oncology

## 2022-10-02 ENCOUNTER — Other Ambulatory Visit: Payer: Self-pay | Admitting: Radiation Oncology

## 2022-10-02 ENCOUNTER — Other Ambulatory Visit: Payer: Self-pay

## 2022-10-02 ENCOUNTER — Encounter: Payer: Self-pay | Admitting: *Deleted

## 2022-10-02 ENCOUNTER — Ambulatory Visit: Payer: BC Managed Care – PPO

## 2022-10-02 ENCOUNTER — Inpatient Hospital Stay: Payer: BC Managed Care – PPO | Attending: Genetic Counselor

## 2022-10-02 ENCOUNTER — Inpatient Hospital Stay (HOSPITAL_BASED_OUTPATIENT_CLINIC_OR_DEPARTMENT_OTHER): Payer: BC Managed Care – PPO | Admitting: Hematology

## 2022-10-02 VITALS — BP 98/59 | HR 87 | Temp 98.4°F | Resp 18

## 2022-10-02 DIAGNOSIS — C7931 Secondary malignant neoplasm of brain: Secondary | ICD-10-CM | POA: Diagnosis not present

## 2022-10-02 DIAGNOSIS — Z171 Estrogen receptor negative status [ER-]: Secondary | ICD-10-CM | POA: Insufficient documentation

## 2022-10-02 DIAGNOSIS — C50312 Malignant neoplasm of lower-inner quadrant of left female breast: Secondary | ICD-10-CM | POA: Insufficient documentation

## 2022-10-02 DIAGNOSIS — C50911 Malignant neoplasm of unspecified site of right female breast: Secondary | ICD-10-CM | POA: Insufficient documentation

## 2022-10-02 DIAGNOSIS — Z170 Estrogen receptor positive status [ER+]: Secondary | ICD-10-CM | POA: Diagnosis not present

## 2022-10-02 DIAGNOSIS — C7951 Secondary malignant neoplasm of bone: Secondary | ICD-10-CM | POA: Insufficient documentation

## 2022-10-02 DIAGNOSIS — Z510 Encounter for antineoplastic radiation therapy: Secondary | ICD-10-CM | POA: Diagnosis not present

## 2022-10-02 DIAGNOSIS — G893 Neoplasm related pain (acute) (chronic): Secondary | ICD-10-CM | POA: Insufficient documentation

## 2022-10-02 DIAGNOSIS — Z5112 Encounter for antineoplastic immunotherapy: Secondary | ICD-10-CM | POA: Insufficient documentation

## 2022-10-02 DIAGNOSIS — G959 Disease of spinal cord, unspecified: Secondary | ICD-10-CM

## 2022-10-02 DIAGNOSIS — Z923 Personal history of irradiation: Secondary | ICD-10-CM | POA: Insufficient documentation

## 2022-10-02 DIAGNOSIS — Z79899 Other long term (current) drug therapy: Secondary | ICD-10-CM | POA: Insufficient documentation

## 2022-10-02 LAB — CBC WITH DIFFERENTIAL (CANCER CENTER ONLY)
Abs Immature Granulocytes: 0.16 10*3/uL — ABNORMAL HIGH (ref 0.00–0.07)
Basophils Absolute: 0 10*3/uL (ref 0.0–0.1)
Basophils Relative: 0 %
Eosinophils Absolute: 0 10*3/uL (ref 0.0–0.5)
Eosinophils Relative: 0 %
HCT: 38.5 % (ref 36.0–46.0)
Hemoglobin: 13.3 g/dL (ref 12.0–15.0)
Immature Granulocytes: 2 %
Lymphocytes Relative: 7 %
Lymphs Abs: 0.5 10*3/uL — ABNORMAL LOW (ref 0.7–4.0)
MCH: 31.8 pg (ref 26.0–34.0)
MCHC: 34.5 g/dL (ref 30.0–36.0)
MCV: 92.1 fL (ref 80.0–100.0)
Monocytes Absolute: 0.5 10*3/uL (ref 0.1–1.0)
Monocytes Relative: 7 %
Neutro Abs: 6.5 10*3/uL (ref 1.7–7.7)
Neutrophils Relative %: 84 %
Platelet Count: 189 10*3/uL (ref 150–400)
RBC: 4.18 MIL/uL (ref 3.87–5.11)
RDW: 13.6 % (ref 11.5–15.5)
WBC Count: 7.7 10*3/uL (ref 4.0–10.5)
nRBC: 0 % (ref 0.0–0.2)

## 2022-10-02 LAB — RAD ONC ARIA SESSION SUMMARY
Course Elapsed Days: 12
Plan Fractions Treated to Date: 3
Plan Fractions Treated to Date: 3
Plan Prescribed Dose Per Fraction: 3 Gy
Plan Prescribed Dose Per Fraction: 3 Gy
Plan Total Fractions Prescribed: 10
Plan Total Fractions Prescribed: 10
Plan Total Prescribed Dose: 30 Gy
Plan Total Prescribed Dose: 30 Gy
Reference Point Dosage Given to Date: 9 Gy
Reference Point Dosage Given to Date: 9 Gy
Reference Point Session Dosage Given: 3 Gy
Reference Point Session Dosage Given: 3 Gy
Session Number: 4

## 2022-10-02 LAB — CMP (CANCER CENTER ONLY)
ALT: 41 U/L (ref 0–44)
AST: 71 U/L — ABNORMAL HIGH (ref 15–41)
Albumin: 3.9 g/dL (ref 3.5–5.0)
Alkaline Phosphatase: 920 U/L — ABNORMAL HIGH (ref 38–126)
Anion gap: 8 (ref 5–15)
BUN: 19 mg/dL (ref 8–23)
CO2: 29 mmol/L (ref 22–32)
Calcium: 11.3 mg/dL — ABNORMAL HIGH (ref 8.9–10.3)
Chloride: 100 mmol/L (ref 98–111)
Creatinine: 0.45 mg/dL (ref 0.44–1.00)
GFR, Estimated: >60 mL/min (ref >60)
Glucose, Bld: 122 mg/dL — ABNORMAL HIGH (ref 70–99)
Potassium: 4 mmol/L (ref 3.5–5.1)
Sodium: 137 mmol/L (ref 135–145)
Total Bilirubin: 0.5 mg/dL (ref 0.3–1.2)
Total Protein: 6.5 g/dL (ref 6.5–8.1)

## 2022-10-02 MED ORDER — GABAPENTIN 100 MG PO CAPS: 200.0000 mg | ORAL_CAPSULE | Freq: Three times a day (TID) | ORAL | 0 refills | Status: DC

## 2022-10-02 MED ORDER — LIDOCAINE VISCOUS HCL 2 % MT SOLN: OROMUCOSAL | 3 refills | Status: DC

## 2022-10-02 MED ORDER — PROCHLORPERAZINE MALEATE 10 MG PO TABS: 10.0000 mg | ORAL_TABLET | Freq: Four times a day (QID) | ORAL | 1 refills | Status: DC | PRN

## 2022-10-02 MED ORDER — LIDOCAINE-PRILOCAINE 2.5-2.5 % EX CREA: TOPICAL_CREAM | CUTANEOUS | 3 refills | Status: DC

## 2022-10-02 MED ORDER — SONAFINE EX EMUL
1.0000 | Freq: Once | CUTANEOUS | Status: AC
  Administered 2022-10-02: 1 via TOPICAL

## 2022-10-02 MED ORDER — ONDANSETRON HCL 8 MG PO TABS: 8.0000 mg | ORAL_TABLET | Freq: Three times a day (TID) | ORAL | 1 refills | Status: DC | PRN

## 2022-10-02 NOTE — Progress Notes (Signed)
Martha [name]   [phone] [phone]   Clinic Follow up Note   Patient Care Team: [name], MD as PCP - General (Family Medicine) [name], MD as Consulting Physician (General Surgery) Truitt Merle, MD as Consulting Physician (Hematology) Eppie Gibson, MD as Attending Physician (Radiation Oncology) Alla Feeling, NP as Nurse Practitioner (Nurse Practitioner)  Date of Service:  10/02/2022  CHIEF COMPLAINT: f/u of left breast cancer and Right   CURRENT THERAPY:  Zometa q3 months/   Paclitaxel+ Trastuzumab+ Pertuzumab q21d x 8 cycles /Trastuzumab+Pertuzumab q21 x4 cyclesstarting 10/19/2022  ASSESSMENT:  Martha [name] is a 62 y.o. female with   Malignant neoplasm of lower-inner quadrant of left breast in female, estrogen receptor positive (Martha [name]) --She was diagnosed in 12/2017. She is s/p left breast lumpectomy and adjuvant radiation.  -She started anti-estrogen therapy with letrozole on 05/2018. Tolerating well with no issues -lost f/u after visit in 04/2020 until her recurrence in 07/2022   Cancer, metastatic to bone [facility] -presented with neck pain in July 2023 -MRI cervical spine on 07/24/2022 showed Extensive osseous metastatic disease with pathologic fracture at the base of dens and C2 right lateral mass. Extraosseous tumor at C1 and C2 likely impinging on the right C2 and C3 nerve roots. -PET scan from 08/03/2022 showed diffuse bone mets  -she underwent cervical laminectomy and fusion by Dr. Marcello Moores on 08/17/2022, biopsy confirmed metastatic breast cancer, ER/PR negative, HER2 positive. -due to cervical cord compression with myelopathy, she underwent a second cervical spine surgery on September 06, 2022, and just completed inpatient rehabitation. -She has started brain radiation. -I discussed systemic treatment, I recommend first-line treatment with chemotherapy and HER2 antibodies.  I recommend weekly Taxol, trastuzumab and Perjeta.  I  also discussed other alternative chemo regiment such as docetaxel, due to her recovery from recent 2 surgeries, she will probably tolerate weekly Taxol better.  We also discussed multiple other effective chemotherapy agents and HER2 antibodies, if she progresses on first-line treatment. --Chemotherapy consent: Side effects including but does not not limited to, fatigue, nausea, vomiting, diarrhea, hair loss, neuropathy, fluid retention, renal and kidney dysfunction, neutropenic fever, needed for blood transfusion, bleeding, were discussed with patient in great detail. She agrees to proceed. -The goal of therapy is palliative, for disease control, and prolong her life. -Will continue Zometa every 3 months, calcium and vitamin D supplement. She is scheduled to see her dentist next month, will get the clearance and start zometa after      PLAN: -lab reviewed - I refilled Gabapentin -Discuss Chemo regiment Taxol or Abraxane and it side effects  -discuss Antibody Trastuzumab and Perjeta - Chemo Education -will start week taxol and HP on 2/22      SUMMARY OF ONCOLOGIC HISTORY: Oncology History Overview Note  Cancer Staging Malignant neoplasm of lower-inner quadrant of left breast in female, estrogen receptor positive (Martha [name]) Staging form: Breast, AJCC 8th Edition - Clinical stage from 01/16/2018: Stage IA (cT1c, cN0, cM0, G2, ER+, PR+, HER2-) - Signed by Truitt Merle, MD on 01/23/2018 - Pathologic: Stage IA (pT1c, pN1, cM0, G1, ER+, PR+, HER2-) - Signed by Eppie Gibson, MD on 04/09/2018     Malignant neoplasm of lower-inner quadrant of left breast in female, estrogen receptor positive (Martha [name])  01/15/2018 Mammogram   Diagnositc Mammogram 01/15/18  IMPRESSION: 1. Suspicious 1.2 x 1.4 x 1.3 cm mixed echogenicity mass left breast 7 o'clock position retroareolar location at the site of palpable concern.. 2. Indeterminate Within the left  breast 7:30 o'clock retroareolar location, adjacent to the  palpable mass, is a 0.5 x 0.4 x 0.5 cm oval circumscribed hypoechoic mass. 3. Indeterminate calcifications within the lateral left breast. Location of these calcifications is not definitely confirmed on the true lateral view.    01/16/2018 Initial Biopsy   Diagnosis 01/16/18 1. Breast, left, needle core biopsy, 7:30 o'clock (ribbon clip) - FIBROCYSTIC CHANGES WITH SCLEROSING ADENOSIS AND CALCIFICATIONS. - FIBROADENOMATOID CHANGE. - NO MALIGNANCY IDENTIFIED. 2. Breast, left, needle core biopsy, 7 o'clock position (coil clip) - INVASIVE MAMMARY CARCINOMA, MSBR GRADE I/II. - SEE MICROSCOPIC DESCRIPTION Microscopic Comment  ADDENDUM: Immunohistochemistry for E-Cadherin is strongly positive in the tumor consistent with ductal carcinoma. (JDP:ah 01/17/18)   01/16/2018 Receptors her2   Estrogen Receptor: 100%, POSITIVE, STRONG STAINING INTENSITY Progesterone Receptor: 50%, POSITIVE, STRONG STAINING INTENSITY Proliferation Marker Ki67: 20% HER2 Negative   01/16/2018 Cancer Staging   Staging form: Breast, AJCC 8th Edition - Clinical stage from 01/16/2018: Stage IA (cT1c, cN0, cM0, G2, ER+, PR+, HER2-) - Signed by Truitt Merle, MD on 01/23/2018   01/22/2018 Initial Diagnosis   Malignant neoplasm of lower-inner quadrant of left breast in female, estrogen receptor positive ([place])   02/21/2018 Surgery    LEFT BREAST LUMPECTOMY WITH AXILLARY LYMPH NODE BIOPSY by Dr. Barry Dienes  02/21/18   02/21/2018 Pathology Results   Diagnosis 02/21/18 1. Breast, lumpectomy, Left - INVASIVE DUCTAL CARCINOMA, GRADE I, 1.6 CM. - DUCTAL CARCINOMA IN SITU, INTERMEDIATE NUCLEAR GRADE. - ANTERIOR AND MEDIAL RESECTION MARGINS ARE POSITIVE FOR CARCINOMA. - NEGATIVE FOR LYMPHOVASCULAR OR PERINEURAL INVASION. - BACKGROUND BREAST TISSUE WITH FIBROCYSTIC CHANGE, INCLUDING SCLEROSING ADENOSIS. - BIOPSY SITE CHANGES. - SEE ONCOLOGY TABLE. 2. Lymph node, sentinel, biopsy, Left Axillary #1 - METASTATIC BREAST CARCINOMA TO A LYMPH  NODE, 1.0 CM IN GREATEST DIMENSION, WITH EXTRANODAL EXTENSION (1/1). 3. Lymph node, sentinel, biopsy, Left Axillary #2 - LYMPH NODE, NEGATIVE FOR CARCINOMA (0/1).    02/21/2018 Miscellaneous   Mammaprint 02/21/18 Low Risk with 10-year risk of recurrnce at 10% -No potential signifcant chemotherapy benefit   03/20/2018 Pathology Results   RE-EXCISION OF BREAST LUMPECTOMY by Dr. Barry Dienes  Diagnosis 03/20/18 1. Breast, excision, Left new anterior margin - FIBROCYSTIC CHANGES WITH ADENOSIS AND CALCIFICATIONS. - HEALING BIOPSY SITE. - THERE IS NO EVIDENCE OF MALIGNANCY. 2. Breast, excision, Left new medial margin - FIBROCYSTIC CHANGES WITH ADENOSIS AND CALCIFICATIONS. - HEALING BIOPSY SITE. - THERE IS NO EVIDENCE OF MALIGNANCY. Microscopic Comment 1. -2. The surgical resection margin(s) of the specimen were inked and microscopically evaluated. (JBK:kh 03-22-18)   04/09/2018 Cancer Staging   Staging form: Breast, AJCC 8th Edition - Pathologic: Stage IA (pT1c, pN1, cM0, G1, ER+, PR+, HER2-) - Signed by Eppie Gibson, MD on 04/09/2018   04/22/2018 - 06/03/2018 Radiation Therapy   Radaiton with Dr. Isidore Moos 04/22/18-06/03/18   05/2018 -  Anti-estrogen oral therapy   Letrozole 2.'5mg'$  started 05/2018    Survivorship   Per Cira Rue, NP    05/04/2022 Imaging    IMPRESSION: Cervical spondylosis, as described.   Nonspecific straightening of the expected cervical lordosis.   07/24/2022 Imaging    IMPRESSION: 1. Extensive osseous metastatic disease with pathologic fracture at the base of dens and C2 right lateral mass. Extraosseous tumor at C1 and C2 likely impinging on the right C2 and C3 nerve roots. 2. Degenerative cord impingement at C4-5 to C6-7. Biforaminal impingement at C5-6 and C6-7.   08/03/2022 PET scan    IMPRESSION: 1. Large volume osseous metastasis. 2. Low  right cervical and probable right axillary nodal metastasis. 3. Subtle heterogeneous activity throughout the liver  with suggestion of small liver lesions (likely new compared to chest CT of 07/16/2020). Findings are overall moderately suspicious for hepatic metastasis. Pre and post contrast abdominal MRI (preferred) or CT could confirm. 4. Right-sided pleural thickening and trace pleural fluid. Right base airspace disease is favored to represent chronic atelectasis. 5. Aortic atherosclerosis (ICD10-I70.0) and emphysema (ICD10-J43.9).     08/11/2022 Genetic Testing   Negative genetic testing on the CancerNext-Expanded+RNAinsight panel.  The report date is August 11, 2022.  The CancerNext-Expanded gene panel offered by [facility] and includes sequencing and rearrangement analysis for the following 77 genes: AIP, ALK, APC*, ATM*, AXIN2, BAP1, BARD1, BLM, BMPR1A, BRCA1*, BRCA2*, BRIP1*, CDC73, CDH1*, CDK4, CDKN1B, CDKN2A, CHEK2*, CTNNA1, DICER1, FANCC, FH, FLCN, GALNT12, KIF1B, LZTR1, MAX, MEN1, MET, MLH1*, MSH2*, MSH3, MSH6*, MUTYH*, NBN, NF1*, NF2, NTHL1, PALB2*, PHOX2B, PMS2*, POT1, PRKAR1A, PTCH1, PTEN*, RAD51C*, RAD51D*, RB1, RECQL, RET, SDHA, SDHAF2, SDHB, SDHC, SDHD, SMAD4, SMARCA4, SMARCB1, SMARCE1, STK11, SUFU, TMEM127, TP53*, TSC1, TSC2, VHL and XRCC2 (sequencing and deletion/duplication); EGFR, EGLN1, HOXB13, KIT, MITF, PDGFRA, POLD1, and POLE (sequencing only); EPCAM and GREM1 (deletion/duplication only). DNA and RNA analyses performed for * genes.    Lobular carcinoma in situ (LCIS) of right breast  01/20/2020 Mammogram   Diagnostic Mammogram 01/20/20 IMPRESSION: 1.  Stable post lumpectomy changes of the left breast.   2. Suspicious microcalcifications over the right upper outer quadrant spanning 3.6 cm.   02/02/2020 Initial Biopsy   Diagnosis 02/02/20 Breast, right, needle core biopsy, upper outer quadrant, x clip - LOBULAR CARCINOMA IN SITU WITH PLEOMORPHIC FEATURES AND CALCIFICATIONS, INVOLVING ADENOSIS. SEE NOTE Diagnosis Note Immunohistochemical stain for E-cadherin is negative in the  lesional cells, consistent with a lobular phenotype. Immunostains for p63, SMM 1 and calponin do not show evidence of invasive carcinoma.    02/04/2020 Initial Diagnosis   Lobular carcinoma in situ (LCIS) of right breast   03/18/2020 Surgery   RIGHT BREAST LUMPECTOMY WITH RADIOACTIVE SEED LOCALIZATION by Dr Alessandra Bevels    03/18/2020 Pathology Results   FINAL MICROSCOPIC DIAGNOSIS:   A. BREAST, RIGHT, LUMPECTOMY:  - Pleomorphic lobular carcinoma in situ with calcifications and  underlying complex sclerosing lesion, adenosis and fibroadenomatoid  change.  - Margins of resection are not involved (Closest margins: < 1 mm,  anterior, posterior, inferior and medial).  - Biopsy site.    COMMENT:   P63, Calponin and SMM-1 demonstrate the presence of myoepithelium in the  select focus.    Cancer, metastatic to bone ([place])  05/04/2022 Imaging    IMPRESSION: Cervical spondylosis, as described.   Nonspecific straightening of the expected cervical lordosis.   07/24/2022 Imaging    IMPRESSION: 1. Extensive osseous metastatic disease with pathologic fracture at the base of dens and C2 right lateral mass. Extraosseous tumor at C1 and C2 likely impinging on the right C2 and C3 nerve roots. 2. Degenerative cord impingement at C4-5 to C6-7. Biforaminal impingement at C5-6 and C6-7.   08/02/2022 Initial Diagnosis   Cancer, metastatic to bone ([place])   08/03/2022 PET scan    IMPRESSION: 1. Large volume osseous metastasis. 2. Low right cervical and probable right axillary nodal metastasis. 3. Subtle heterogeneous activity throughout the liver with suggestion of small liver lesions (likely new compared to chest CT of 07/16/2020). Findings are overall moderately suspicious for hepatic metastasis. Pre and post contrast abdominal MRI (preferred) or CT could confirm. 4. Right-sided pleural thickening and trace  pleural fluid. Right base airspace disease is favored to represent chronic atelectasis. 5.  Aortic atherosclerosis (ICD10-I70.0) and emphysema (ICD10-J43.9).     10/19/2022 -  Chemotherapy   Patient is on Treatment Plan : BREAST Paclitaxel D1,8,15 + Trastuzumab D1 + Pertuzumab D1 q21d x 8 cycles / Trastuzumab D1 + Pertuzumab D1 q21d x 4 cycles        INTERVAL HISTORY:  Martha [name] is here for a follow up of  left breast cancer and Right She was last seen by me on 08/04/2022 She presents to the clinic accompanied by sister. Pt states she has to have help to sit up due to her bed being high.Pt have some fine motor skills exercise to do at home. Pt states she is having pain in upper spine. She take hydrocodone for pain.     All other systems were reviewed with the patient and are negative.  MEDICAL HISTORY:  Past Medical History:  Diagnosis Date   Anxiety    Cancer ([place]) 2022   right breast LCIS   Cancer ([place]) 2019   left breast   Depression    Dysrhythmia    SVT, s/p ablation ~ 2012 in [facility]   Ectopic pregnancy    Family history of breast cancer    Family history of melanoma    History of radiation therapy 04/22/18-06/03/18   Left Breast, left SCV, axilla 50 Gy in 25 fractions, Left breast boost 10 Gy in 5 fractions.    Hypothyroidism    Personal history of radiation therapy    PONV (postoperative nausea and vomiting)    Thyroid disease     SURGICAL HISTORY: Past Surgical History:  Procedure Laterality Date   APPENDECTOMY     BILATERAL SALPINGECTOMY     BREAST BIOPSY Right 2014   fibroadenoma   BREAST BIOPSY Left 01/16/2018   BREAST BIOPSY Right 02/02/2020   LCIS   BREAST BIOPSY Right 08/04/2020   x2 LCIS   BREAST BIOPSY Right 08/13/2020   x2   BREAST EXCISIONAL BIOPSY Left 1990   benign   BREAST EXCISIONAL BIOPSY Right 02/02/2020   LCIS   BREAST LUMPECTOMY Left 01/22/2018   BREAST LUMPECTOMY WITH AXILLARY LYMPH NODE BIOPSY Left 02/21/2018   Procedure: LEFT BREAST LUMPECTOMY WITH AXILLARY LYMPH NODE BIOPSY;  Surgeon: [name], MD;  Location: [place];  Service: General;  Laterality: Left;   BREAST LUMPECTOMY WITH RADIOACTIVE SEED LOCALIZATION Right 03/18/2020   Procedure: RIGHT BREAST LUMPECTOMY WITH RADIOACTIVE SEED LOCALIZATION;  Surgeon: [name], MD;  Location: [place];  Service: General;  Laterality: Right;   BREAST SURGERY Left 1993   cyst removed   ENDOMETRIAL ABLATION     EYE SURGERY     GLAUCOMA SURGERY Bilateral    POSTERIOR CERVICAL FUSION/FORAMINOTOMY N/A 08/17/2022   Procedure: Cervical One-Cervical Four POSTERIOR CERVICAL FUSION,  Cervical One LAMINECTOMY REDUCTION OF Cervical Two Fracture, Biopsy of Right Cerical Two Pars  Lesion;  Surgeon: Vallarie Mare, MD;  Location: [place];  Service: Neurosurgery;  Laterality: N/A;   POSTERIOR CERVICAL FUSION/FORAMINOTOMY N/A 09/06/2022   Procedure: Posterior Cervical Fusion, Foraminotomy , Cervical Five-Six, Cervical Six-Seven; Extension of  fusion Cervical Four- Thoracic One;  Surgeon: Vallarie Mare, MD;  Location: [place];  Service: Neurosurgery;  Laterality: N/A;   RADIOACTIVE SEED GUIDED EXCISIONAL BREAST BIOPSY Right 04/28/2021   Procedure: RADIOACTIVE SEED GUIDED EXCISIONAL RIGHT BREAST BIOPSY X2;  Surgeon: [name], MD;  Location: [place];  Service: General;  Laterality: Right;   RE-EXCISION OF BREAST LUMPECTOMY Left 03/20/2018   Procedure: RE-EXCISION OF BREAST LUMPECTOMY;  Surgeon: [name], MD;  Location: [place];  Service: General;  Laterality: Left;    I have reviewed the social history and family history with the patient and they are unchanged from previous note.  ALLERGIES:  is allergic to morphine and related.  MEDICATIONS:  Current Outpatient Medications  Medication Sig Dispense Refill   apixaban (ELIQUIS) 2.5 MG TABS tablet Take 1 tablet (2.5 mg total) by mouth 2 (two) times daily. 86 tablet 0   ascorbic acid (VITAMIN C) 1000 MG tablet Take 1 tablet (1,000 mg total) by mouth daily. 30  tablet 0   citalopram (CELEXA) 40 MG tablet Take 1 tablet (40 mg total) by mouth daily. 30 tablet 0   cyclobenzaprine (FLEXERIL) 10 MG tablet Take 1 tablet (10 mg total) by mouth 3 (three) times daily as needed for muscle spasms. 30 tablet 0   gabapentin (NEURONTIN) 100 MG capsule Take 2 capsules (200 mg total) by mouth 3 (three) times daily. 180 capsule 0   HYDROcodone-acetaminophen (NORCO/VICODIN) 5-325 MG tablet Take 1 tablet by mouth every 4 (four) hours as needed for severe pain. 30 tablet 0   levothyroxine (SYNTHROID) 75 MCG tablet Take 1 tablet (75 mcg total) by mouth daily before breakfast. 30 tablet 0   lidocaine (XYLOCAINE) 2 % solution Patient: Mix 1part 2% viscous lidocaine, 1part H20. Swallow 11m of diluted mixture, 351m before meals and at bedtime, up to QID 100 mL 3   lidocaine-prilocaine (EMLA) cream Apply to affected area once 30 g 3   Mouthwashes (MOUTH RINSE) LIQD solution 15 mLs by Mouth Rinse route as needed (for oral care).  0   ondansetron (ZOFRAN) 8 MG tablet Take 1 tablet (8 mg total) by mouth every 8 (eight) hours as needed for nausea or vomiting. 30 tablet 1   OVER THE COUNTER MEDICATION Take 1 tablet by mouth daily. Protandim Supplement     Probiotic Product (PROBIOTIC PO) Take 1 capsule by mouth daily.     prochlorperazine (COMPAZINE) 10 MG tablet Take 1 tablet (10 mg total) by mouth every 6 (six) hours as needed for nausea or vomiting. 30 tablet 1   senna-docusate (SENOKOT-S) 8.6-50 MG tablet Take 1 tablet by mouth 2 (two) times daily. 60 tablet 0   TYLENOL 500 MG tablet Take 500 mg by mouth every 6 (six) hours as needed for mild pain or headache.     No current facility-administered medications for this visit.    PHYSICAL EXAMINATION: ECOG PERFORMANCE STATUS: 3 - Symptomatic, >50% confined to bed  Vitals:   10/02/22 1100  BP: (!) 98/59  Pulse: 87  Resp: 18  Temp: 98.4 F (36.9 C)  SpO2: 97%   Wt Readings from Last 3 Encounters:  09/26/22 88 lb 13.5 oz  (40.3 kg)  09/04/22 97 lb (44 kg)  08/30/22 97 lb 9.6 oz (44.3 kg)     GENERAL:alert, no distress and comfortable SKIN: skin color normal, no rashes or significant lesions EYES: normal, Conjunctiva are pink and non-injected, sclera clear  NEURO: alert & oriented x 3 with fluent speech ABDOMEN:(-)abdomen soft, non-tender and normal bowel sounds  LABORATORY DATA:  I have reviewed the data as listed    Latest Ref Rng & Units 10/02/2022   11:17 AM 09/25/2022    6:07 AM 09/18/2022    5:37 AM  CBC  WBC 4.0 - 10.5 K/uL 7.7  6.1  8.3   Hemoglobin 12.0 - 15.0 g/dL 13.3  13.3  13.9   Hematocrit 36.0 - 46.0 % 38.5  38.0  39.7   Platelets 150 - 400 K/uL 189  242  240         Latest Ref Rng & Units 10/02/2022   11:17 AM 09/26/2022    6:02 AM 09/14/2022    6:22 AM  CMP  Glucose 70 - 99 mg/dL 122  104  107   BUN 8 - 23 mg/dL '19  21  11   '$ Creatinine 0.44 - 1.00 mg/dL 0.45  0.44  0.38   Sodium 135 - 145 mmol/L 137  135  131   Potassium 3.5 - 5.1 mmol/L 4.0  3.9  3.3   Chloride 98 - 111 mmol/L 100  99  95   CO2 22 - 32 mmol/L '29  29  26   '$ Calcium 8.9 - 10.3 mg/dL 11.3  10.1  9.1   Total Protein 6.5 - 8.1 g/dL 6.5  6.0  6.1   Total Bilirubin 0.3 - 1.2 mg/dL 0.5  0.6  0.8   Alkaline Phos 38 - 126 U/L 920  896  1,078   AST 15 - 41 U/L 71  78  73   ALT 0 - 44 U/L 41  28  73       RADIOGRAPHIC STUDIES: I have personally reviewed the radiological images as listed and agreed with the findings in the report. No results found.    Orders Placed This Encounter  Procedures   IR IMAGING GUIDED PORT INSERTION    Standing Status:   Future    Standing Expiration Date:   10/03/2023    Order Specific Question:   Reason for Exam (SYMPTOM  OR DIAGNOSIS REQUIRED)    Answer:   chemo    Order Specific Question:   Preferred Imaging Location?    Answer:   [facility]   CBC with Differential ([place] Only)    Standing Status:   Future    Standing Expiration Date:   10/20/2023   CMP ([place] only)    Standing Status:   Future    Standing Expiration Date:   10/20/2023   CBC with Differential ([place] Only)    Standing Status:   Future    Standing Expiration Date:   10/27/2023   CMP ([place] only)    Standing Status:   Future    Standing Expiration Date:   10/27/2023   CBC with Differential ([place] Only)    Standing Status:   Future    Standing Expiration Date:   11/03/2023   CMP ([place] only)    Standing Status:   Future    Standing Expiration Date:   11/03/2023   Ambulatory Referral to [facility]    Referral Priority:   Urgent    Referral Type:   Consultation    Referral Reason:   Specialty Services Required    Number of Visits Requested:   1   All questions were answered. The patient knows to call the clinic with any problems, questions or concerns. No barriers to learning was detected. The total time spent in the appointment was 40 minutes.     Truitt Merle, MD 10/02/2022   Felicity Coyer, CMA, am acting as scribe for Truitt Merle, MD.   I have reviewed the above documentation for accuracy and completeness, and I agree with the above.

## 2022-10-02 NOTE — Research (Signed)
[id number]: A RANDOMIZED TRIAL ADDRESSING CANCER-RELATED FINANCIAL HARDSHIP THROUGH DELIVERY OF A PROACTIVE FINANCIAL NAVIGATION INTERVENTION (CREDIT)  Met with patient and her sister for 5 minutes to follow up on her interest in the above study.  Provided patient with a study brochure to remind her of the study. She had previously been provided with ICFs and HIPAA form. Reminded patient that participation is voluntary and is also open to her husband if he is interested in participating. Patient states she is still interested and will discuss with her husband. She agreed for research nurse to follow up with her next week. Gave patient my business card and encouraged her to call sooner if any questions. She verbalized understanding.  Foye Spurling, BSN, RN, [place] Nurse II [phone] 10/02/2022 12:59 PM

## 2022-10-03 ENCOUNTER — Other Ambulatory Visit: Payer: Self-pay

## 2022-10-03 ENCOUNTER — Other Ambulatory Visit: Payer: Self-pay | Admitting: Hematology

## 2022-10-03 ENCOUNTER — Ambulatory Visit: Payer: BC Managed Care – PPO

## 2022-10-03 ENCOUNTER — Telehealth: Payer: Self-pay | Admitting: Hematology

## 2022-10-03 ENCOUNTER — Ambulatory Visit
Admission: RE | Admit: 2022-10-03 | Discharge: 2022-10-03 | Disposition: A | Discharge status: Home / Self Care | Payer: BC Managed Care – PPO | Source: Ambulatory Visit | Attending: Radiation Oncology | Admitting: Radiation Oncology

## 2022-10-03 ENCOUNTER — Inpatient Hospital Stay: Payer: BC Managed Care – PPO | Admitting: Dietician

## 2022-10-03 DIAGNOSIS — Z510 Encounter for antineoplastic radiation therapy: Secondary | ICD-10-CM | POA: Diagnosis not present

## 2022-10-03 DIAGNOSIS — C7951 Secondary malignant neoplasm of bone: Secondary | ICD-10-CM | POA: Diagnosis not present

## 2022-10-03 DIAGNOSIS — Z170 Estrogen receptor positive status [ER+]: Secondary | ICD-10-CM | POA: Diagnosis not present

## 2022-10-03 DIAGNOSIS — C50312 Malignant neoplasm of lower-inner quadrant of left female breast: Secondary | ICD-10-CM | POA: Diagnosis not present

## 2022-10-03 DIAGNOSIS — C7931 Secondary malignant neoplasm of brain: Secondary | ICD-10-CM | POA: Diagnosis not present

## 2022-10-03 LAB — RAD ONC ARIA SESSION SUMMARY
Course Elapsed Days: 13
Plan Fractions Treated to Date: 4
Plan Fractions Treated to Date: 4
Plan Prescribed Dose Per Fraction: 3 Gy
Plan Prescribed Dose Per Fraction: 3 Gy
Plan Total Fractions Prescribed: 10
Plan Total Fractions Prescribed: 10
Plan Total Prescribed Dose: 30 Gy
Plan Total Prescribed Dose: 30 Gy
Reference Point Dosage Given to Date: 12 Gy
Reference Point Dosage Given to Date: 12 Gy
Reference Point Session Dosage Given: 3 Gy
Reference Point Session Dosage Given: 3 Gy
Session Number: 5

## 2022-10-03 NOTE — Progress Notes (Signed)
Nutrition Assessment   Reason for Assessment: Referral   ASSESSMENT: 62 year old female with recurrent breast cancer metastatic to brain and bone (12/23). S/p C1-C4 fusion on 12/21. She is pending start of palliative chemotherapy with paclitaxel/trastuzumab/pertuzumab q21d (first 2/21). She is currently receiving radiation therapy + zometa q12w (fist 1/3). Patient is under the care of Dr. Burr Medico.   Past medical history includes alcohol use, nicotine dependence, paresthesia of BLE, cervicalgia  Met with patient in office. She is in a wheelchair today. Patient is joined by her sister who is visiting from [place]. Patient appears uncomfortable. She reports having significant pain s/p radiation as well as sitting for long periods of time. She is asking how long appointment will take this morning. Patient reports appetite is poor. Per sister, yesterday patient had a cup of yogurt and a milkshake. Patient is wearing a neck collar s/p recent surgery. This is causing some swallowing difficulty with solid foods. She is able to remove the collar for short periods of time. Patient denies constipation, diarrhea, nausea, vomiting.    Nutrition Focused Physical Exam: deferred (pt with increased pain during visit)     Medications: eliquis, celexa, flexeril, gabapentin, norco, lidocaine, zofran, compazine, senokot-s   Labs: 2/5 - glucose 122, calcium 11.3   Anthropometrics: Per chart, weights have decreased 23% (26%) in 5 months. Pt weighed 114 lb 6.4 oz on 05/04/22. This is severe for time frame  Height: 5'2.99" Weight: 88 lb 13.5 oz UBW: 120 lb (per pt) BMI: 15.74 (underweight)   NUTRITION DIAGNOSIS: Inadequate oral intake related to recurrent breast cancer metastatic to bone and brain as evidenced by reported chronic pain, difficulty swallowing with neck brace in place, 27% (32 lb) decrease from reported usual weight which is severe   INTERVENTION:  Discussed strategies for poor appetite,  encouraged bites/sips q2h of high calorie high protein foods - soft moist high protein food list + shake recipes provided Encouraged Ensure Complete/equivalent 2-3 daily One complimentary case of Ensure Complete + sample packets of CIB powder given for pt to try Contact information given     MONITORING, EVALUATION, GOAL: Patient will tolerate increased calories and protein to promote weight gain    Next Visit: Thursday February 29 during infusion

## 2022-10-03 NOTE — Telephone Encounter (Signed)
Contacted patient to scheduled appointments. Left message with appointment details and a call back number if patient had any questions or could not accommodate the time we provided.   

## 2022-10-04 ENCOUNTER — Other Ambulatory Visit: Payer: Self-pay

## 2022-10-04 ENCOUNTER — Ambulatory Visit
Admission: RE | Admit: 2022-10-04 | Discharge: 2022-10-04 | Disposition: A | Discharge status: Home / Self Care | Payer: BC Managed Care – PPO | Source: Ambulatory Visit | Attending: Radiation Oncology | Admitting: Radiation Oncology

## 2022-10-04 ENCOUNTER — Ambulatory Visit: Payer: BC Managed Care – PPO

## 2022-10-04 DIAGNOSIS — Z170 Estrogen receptor positive status [ER+]: Secondary | ICD-10-CM | POA: Diagnosis not present

## 2022-10-04 DIAGNOSIS — C7951 Secondary malignant neoplasm of bone: Secondary | ICD-10-CM | POA: Diagnosis not present

## 2022-10-04 DIAGNOSIS — C50312 Malignant neoplasm of lower-inner quadrant of left female breast: Secondary | ICD-10-CM | POA: Diagnosis not present

## 2022-10-04 DIAGNOSIS — Z510 Encounter for antineoplastic radiation therapy: Secondary | ICD-10-CM | POA: Diagnosis not present

## 2022-10-04 DIAGNOSIS — C7931 Secondary malignant neoplasm of brain: Secondary | ICD-10-CM | POA: Diagnosis not present

## 2022-10-04 LAB — RAD ONC ARIA SESSION SUMMARY
Course Elapsed Days: 14
Plan Fractions Treated to Date: 5
Plan Fractions Treated to Date: 5
Plan Prescribed Dose Per Fraction: 3 Gy
Plan Prescribed Dose Per Fraction: 3 Gy
Plan Total Fractions Prescribed: 10
Plan Total Fractions Prescribed: 10
Plan Total Prescribed Dose: 30 Gy
Plan Total Prescribed Dose: 30 Gy
Reference Point Dosage Given to Date: 15 Gy
Reference Point Dosage Given to Date: 15 Gy
Reference Point Session Dosage Given: 3 Gy
Reference Point Session Dosage Given: 3 Gy
Session Number: 6

## 2022-10-05 ENCOUNTER — Other Ambulatory Visit: Payer: Self-pay

## 2022-10-05 ENCOUNTER — Ambulatory Visit
Admission: RE | Admit: 2022-10-05 | Discharge: 2022-10-05 | Disposition: A | Discharge status: Home / Self Care | Payer: BC Managed Care – PPO | Source: Ambulatory Visit | Attending: Radiation Oncology | Admitting: Radiation Oncology

## 2022-10-05 ENCOUNTER — Ambulatory Visit: Payer: BC Managed Care – PPO

## 2022-10-05 DIAGNOSIS — Z510 Encounter for antineoplastic radiation therapy: Secondary | ICD-10-CM | POA: Diagnosis not present

## 2022-10-05 DIAGNOSIS — Z170 Estrogen receptor positive status [ER+]: Secondary | ICD-10-CM | POA: Diagnosis not present

## 2022-10-05 DIAGNOSIS — C7931 Secondary malignant neoplasm of brain: Secondary | ICD-10-CM | POA: Diagnosis not present

## 2022-10-05 DIAGNOSIS — C50312 Malignant neoplasm of lower-inner quadrant of left female breast: Secondary | ICD-10-CM | POA: Diagnosis not present

## 2022-10-05 DIAGNOSIS — C7951 Secondary malignant neoplasm of bone: Secondary | ICD-10-CM | POA: Diagnosis not present

## 2022-10-05 LAB — RAD ONC ARIA SESSION SUMMARY
Course Elapsed Days: 15
Plan Fractions Treated to Date: 6
Plan Fractions Treated to Date: 6
Plan Prescribed Dose Per Fraction: 3 Gy
Plan Prescribed Dose Per Fraction: 3 Gy
Plan Total Fractions Prescribed: 10
Plan Total Fractions Prescribed: 10
Plan Total Prescribed Dose: 30 Gy
Plan Total Prescribed Dose: 30 Gy
Reference Point Dosage Given to Date: 18 Gy
Reference Point Dosage Given to Date: 18 Gy
Reference Point Session Dosage Given: 3 Gy
Reference Point Session Dosage Given: 3 Gy
Session Number: 7

## 2022-10-06 ENCOUNTER — Other Ambulatory Visit: Payer: Self-pay

## 2022-10-06 ENCOUNTER — Ambulatory Visit: Payer: BC Managed Care – PPO

## 2022-10-06 ENCOUNTER — Ambulatory Visit
Admission: RE | Admit: 2022-10-06 | Discharge: 2022-10-06 | Disposition: A | Discharge status: Home / Self Care | Payer: BC Managed Care – PPO | Source: Ambulatory Visit | Attending: Radiation Oncology | Admitting: Radiation Oncology

## 2022-10-06 DIAGNOSIS — Z170 Estrogen receptor positive status [ER+]: Secondary | ICD-10-CM | POA: Diagnosis not present

## 2022-10-06 DIAGNOSIS — C7931 Secondary malignant neoplasm of brain: Secondary | ICD-10-CM | POA: Diagnosis not present

## 2022-10-06 DIAGNOSIS — C50312 Malignant neoplasm of lower-inner quadrant of left female breast: Secondary | ICD-10-CM | POA: Diagnosis not present

## 2022-10-06 DIAGNOSIS — Z510 Encounter for antineoplastic radiation therapy: Secondary | ICD-10-CM | POA: Diagnosis not present

## 2022-10-06 DIAGNOSIS — C7951 Secondary malignant neoplasm of bone: Secondary | ICD-10-CM | POA: Diagnosis not present

## 2022-10-06 LAB — RAD ONC ARIA SESSION SUMMARY
Course Elapsed Days: 16
Plan Fractions Treated to Date: 7
Plan Fractions Treated to Date: 7
Plan Prescribed Dose Per Fraction: 3 Gy
Plan Prescribed Dose Per Fraction: 3 Gy
Plan Total Fractions Prescribed: 10
Plan Total Fractions Prescribed: 10
Plan Total Prescribed Dose: 30 Gy
Plan Total Prescribed Dose: 30 Gy
Reference Point Dosage Given to Date: 21 Gy
Reference Point Dosage Given to Date: 21 Gy
Reference Point Session Dosage Given: 3 Gy
Reference Point Session Dosage Given: 3 Gy
Session Number: 8

## 2022-10-09 ENCOUNTER — Ambulatory Visit
Admission: RE | Admit: 2022-10-09 | Discharge: 2022-10-09 | Disposition: A | Discharge status: Home / Self Care | Payer: BC Managed Care – PPO | Source: Ambulatory Visit | Attending: Radiation Oncology | Admitting: Radiation Oncology

## 2022-10-09 ENCOUNTER — Other Ambulatory Visit: Payer: Self-pay | Admitting: Radiation Oncology

## 2022-10-09 ENCOUNTER — Ambulatory Visit: Payer: BC Managed Care – PPO

## 2022-10-09 ENCOUNTER — Other Ambulatory Visit: Payer: Self-pay

## 2022-10-09 DIAGNOSIS — C7951 Secondary malignant neoplasm of bone: Secondary | ICD-10-CM | POA: Diagnosis not present

## 2022-10-09 DIAGNOSIS — Z510 Encounter for antineoplastic radiation therapy: Secondary | ICD-10-CM | POA: Diagnosis not present

## 2022-10-09 DIAGNOSIS — Z170 Estrogen receptor positive status [ER+]: Secondary | ICD-10-CM | POA: Diagnosis not present

## 2022-10-09 DIAGNOSIS — C50312 Malignant neoplasm of lower-inner quadrant of left female breast: Secondary | ICD-10-CM | POA: Diagnosis not present

## 2022-10-09 DIAGNOSIS — C7931 Secondary malignant neoplasm of brain: Secondary | ICD-10-CM | POA: Diagnosis not present

## 2022-10-09 LAB — RAD ONC ARIA SESSION SUMMARY
Course Elapsed Days: 19
Plan Fractions Treated to Date: 8
Plan Fractions Treated to Date: 8
Plan Prescribed Dose Per Fraction: 3 Gy
Plan Prescribed Dose Per Fraction: 3 Gy
Plan Total Fractions Prescribed: 10
Plan Total Fractions Prescribed: 10
Plan Total Prescribed Dose: 30 Gy
Plan Total Prescribed Dose: 30 Gy
Reference Point Dosage Given to Date: 24 Gy
Reference Point Dosage Given to Date: 24 Gy
Reference Point Session Dosage Given: 3 Gy
Reference Point Session Dosage Given: 3 Gy
Session Number: 9

## 2022-10-10 ENCOUNTER — Ambulatory Visit: Payer: BC Managed Care – PPO

## 2022-10-10 ENCOUNTER — Other Ambulatory Visit: Payer: Self-pay

## 2022-10-10 ENCOUNTER — Other Ambulatory Visit: Payer: Self-pay | Admitting: Hematology

## 2022-10-10 ENCOUNTER — Ambulatory Visit
Admission: RE | Admit: 2022-10-10 | Discharge: 2022-10-10 | Disposition: A | Discharge status: Home / Self Care | Payer: BC Managed Care – PPO | Source: Ambulatory Visit | Attending: Radiation Oncology | Admitting: Radiation Oncology

## 2022-10-10 DIAGNOSIS — F17210 Nicotine dependence, cigarettes, uncomplicated: Secondary | ICD-10-CM | POA: Diagnosis present

## 2022-10-10 DIAGNOSIS — R4701 Aphasia: Secondary | ICD-10-CM | POA: Diagnosis not present

## 2022-10-10 DIAGNOSIS — C50312 Malignant neoplasm of lower-inner quadrant of left female breast: Secondary | ICD-10-CM | POA: Diagnosis not present

## 2022-10-10 DIAGNOSIS — Z452 Encounter for adjustment and management of vascular access device: Secondary | ICD-10-CM | POA: Diagnosis not present

## 2022-10-10 DIAGNOSIS — Z0189 Encounter for other specified special examinations: Secondary | ICD-10-CM | POA: Diagnosis not present

## 2022-10-10 DIAGNOSIS — R443 Hallucinations, unspecified: Secondary | ICD-10-CM | POA: Diagnosis not present

## 2022-10-10 DIAGNOSIS — R404 Transient alteration of awareness: Secondary | ICD-10-CM | POA: Diagnosis not present

## 2022-10-10 DIAGNOSIS — Z79899 Other long term (current) drug therapy: Secondary | ICD-10-CM | POA: Diagnosis not present

## 2022-10-10 DIAGNOSIS — Z1152 Encounter for screening for COVID-19: Secondary | ICD-10-CM | POA: Diagnosis not present

## 2022-10-10 DIAGNOSIS — R64 Cachexia: Secondary | ICD-10-CM | POA: Diagnosis not present

## 2022-10-10 DIAGNOSIS — Z23 Encounter for immunization: Secondary | ICD-10-CM | POA: Diagnosis not present

## 2022-10-10 DIAGNOSIS — M542 Cervicalgia: Secondary | ICD-10-CM | POA: Diagnosis not present

## 2022-10-10 DIAGNOSIS — Z515 Encounter for palliative care: Secondary | ICD-10-CM | POA: Diagnosis not present

## 2022-10-10 DIAGNOSIS — E43 Unspecified severe protein-calorie malnutrition: Secondary | ICD-10-CM | POA: Diagnosis not present

## 2022-10-10 DIAGNOSIS — R451 Restlessness and agitation: Secondary | ICD-10-CM | POA: Diagnosis not present

## 2022-10-10 DIAGNOSIS — C7951 Secondary malignant neoplasm of bone: Secondary | ICD-10-CM | POA: Diagnosis not present

## 2022-10-10 DIAGNOSIS — R1310 Dysphagia, unspecified: Secondary | ICD-10-CM | POA: Diagnosis present

## 2022-10-10 DIAGNOSIS — G9389 Other specified disorders of brain: Secondary | ICD-10-CM | POA: Diagnosis not present

## 2022-10-10 DIAGNOSIS — Z681 Body mass index (BMI) 19 or less, adult: Secondary | ICD-10-CM | POA: Diagnosis not present

## 2022-10-10 DIAGNOSIS — C7981 Secondary malignant neoplasm of breast: Secondary | ICD-10-CM | POA: Diagnosis not present

## 2022-10-10 DIAGNOSIS — R9431 Abnormal electrocardiogram [ECG] [EKG]: Secondary | ICD-10-CM | POA: Diagnosis not present

## 2022-10-10 DIAGNOSIS — G893 Neoplasm related pain (acute) (chronic): Secondary | ICD-10-CM | POA: Diagnosis not present

## 2022-10-10 DIAGNOSIS — Z66 Do not resuscitate: Secondary | ICD-10-CM | POA: Diagnosis not present

## 2022-10-10 DIAGNOSIS — C7931 Secondary malignant neoplasm of brain: Secondary | ICD-10-CM | POA: Diagnosis not present

## 2022-10-10 DIAGNOSIS — Z510 Encounter for antineoplastic radiation therapy: Secondary | ICD-10-CM | POA: Diagnosis not present

## 2022-10-10 DIAGNOSIS — R4182 Altered mental status, unspecified: Secondary | ICD-10-CM | POA: Diagnosis not present

## 2022-10-10 DIAGNOSIS — J90 Pleural effusion, not elsewhere classified: Secondary | ICD-10-CM | POA: Diagnosis not present

## 2022-10-10 DIAGNOSIS — E039 Hypothyroidism, unspecified: Secondary | ICD-10-CM | POA: Diagnosis not present

## 2022-10-10 DIAGNOSIS — C801 Malignant (primary) neoplasm, unspecified: Secondary | ICD-10-CM | POA: Diagnosis not present

## 2022-10-10 DIAGNOSIS — M8448XA Pathological fracture, other site, initial encounter for fracture: Secondary | ICD-10-CM | POA: Diagnosis not present

## 2022-10-10 DIAGNOSIS — E876 Hypokalemia: Secondary | ICD-10-CM | POA: Diagnosis present

## 2022-10-10 DIAGNOSIS — Z7189 Other specified counseling: Secondary | ICD-10-CM | POA: Diagnosis not present

## 2022-10-10 DIAGNOSIS — C799 Secondary malignant neoplasm of unspecified site: Secondary | ICD-10-CM | POA: Diagnosis not present

## 2022-10-10 DIAGNOSIS — R079 Chest pain, unspecified: Secondary | ICD-10-CM | POA: Diagnosis not present

## 2022-10-10 DIAGNOSIS — R109 Unspecified abdominal pain: Secondary | ICD-10-CM | POA: Diagnosis not present

## 2022-10-10 DIAGNOSIS — Z170 Estrogen receptor positive status [ER+]: Secondary | ICD-10-CM | POA: Diagnosis not present

## 2022-10-10 DIAGNOSIS — F32A Depression, unspecified: Secondary | ICD-10-CM | POA: Diagnosis not present

## 2022-10-10 DIAGNOSIS — F064 Anxiety disorder due to known physiological condition: Secondary | ICD-10-CM | POA: Diagnosis present

## 2022-10-10 LAB — RAD ONC ARIA SESSION SUMMARY
Course Elapsed Days: 20
Plan Fractions Treated to Date: 9
Plan Fractions Treated to Date: 9
Plan Prescribed Dose Per Fraction: 3 Gy
Plan Prescribed Dose Per Fraction: 3 Gy
Plan Total Fractions Prescribed: 10
Plan Total Fractions Prescribed: 10
Plan Total Prescribed Dose: 30 Gy
Plan Total Prescribed Dose: 30 Gy
Reference Point Dosage Given to Date: 27 Gy
Reference Point Dosage Given to Date: 27 Gy
Reference Point Session Dosage Given: 3 Gy
Reference Point Session Dosage Given: 3 Gy
Session Number: 10

## 2022-10-11 ENCOUNTER — Ambulatory Visit
Admission: RE | Admit: 2022-10-11 | Discharge: 2022-10-11 | Disposition: A | Discharge status: Home / Self Care | Payer: BC Managed Care – PPO | Source: Ambulatory Visit | Attending: Radiation Oncology | Admitting: Radiation Oncology

## 2022-10-11 ENCOUNTER — Inpatient Hospital Stay: Payer: BC Managed Care – PPO

## 2022-10-11 ENCOUNTER — Other Ambulatory Visit: Payer: Self-pay

## 2022-10-11 DIAGNOSIS — Z510 Encounter for antineoplastic radiation therapy: Secondary | ICD-10-CM | POA: Diagnosis not present

## 2022-10-11 DIAGNOSIS — C7951 Secondary malignant neoplasm of bone: Secondary | ICD-10-CM | POA: Diagnosis not present

## 2022-10-11 DIAGNOSIS — Z170 Estrogen receptor positive status [ER+]: Secondary | ICD-10-CM | POA: Diagnosis not present

## 2022-10-11 DIAGNOSIS — C50312 Malignant neoplasm of lower-inner quadrant of left female breast: Secondary | ICD-10-CM | POA: Diagnosis not present

## 2022-10-11 LAB — RAD ONC ARIA SESSION SUMMARY
Course Elapsed Days: 21
Plan Fractions Treated to Date: 10
Plan Fractions Treated to Date: 10
Plan Prescribed Dose Per Fraction: 3 Gy
Plan Prescribed Dose Per Fraction: 3 Gy
Plan Total Fractions Prescribed: 10
Plan Total Fractions Prescribed: 10
Plan Total Prescribed Dose: 30 Gy
Plan Total Prescribed Dose: 30 Gy
Reference Point Dosage Given to Date: 30 Gy
Reference Point Dosage Given to Date: 30 Gy
Reference Point Session Dosage Given: 3 Gy
Reference Point Session Dosage Given: 3 Gy
Session Number: 11

## 2022-10-12 ENCOUNTER — Other Ambulatory Visit: Payer: Self-pay

## 2022-10-12 ENCOUNTER — Ambulatory Visit: Payer: BC Managed Care – PPO | Admitting: Occupational Therapy

## 2022-10-12 ENCOUNTER — Ambulatory Visit: Payer: BC Managed Care – PPO | Admitting: Physical Therapy

## 2022-10-12 ENCOUNTER — Observation Stay (HOSPITAL_COMMUNITY): Payer: BC Managed Care – PPO

## 2022-10-12 ENCOUNTER — Emergency Department (HOSPITAL_COMMUNITY): Payer: BC Managed Care – PPO

## 2022-10-12 ENCOUNTER — Inpatient Hospital Stay (HOSPITAL_COMMUNITY)
Admission: EM | Admit: 2022-10-12 | Discharge: 2022-10-20 | DRG: 947 | Disposition: A | Discharge status: Home Health | Payer: BC Managed Care – PPO | Attending: Internal Medicine | Admitting: Internal Medicine

## 2022-10-12 ENCOUNTER — Other Ambulatory Visit: Payer: Self-pay | Admitting: Radiology

## 2022-10-12 ENCOUNTER — Encounter (HOSPITAL_COMMUNITY): Payer: Self-pay

## 2022-10-12 DIAGNOSIS — M8448XA Pathological fracture, other site, initial encounter for fracture: Secondary | ICD-10-CM | POA: Diagnosis present

## 2022-10-12 DIAGNOSIS — R443 Hallucinations, unspecified: Secondary | ICD-10-CM | POA: Diagnosis not present

## 2022-10-12 DIAGNOSIS — Z515 Encounter for palliative care: Principal | ICD-10-CM

## 2022-10-12 DIAGNOSIS — Z7189 Other specified counseling: Secondary | ICD-10-CM

## 2022-10-12 DIAGNOSIS — C7951 Secondary malignant neoplasm of bone: Secondary | ICD-10-CM | POA: Diagnosis present

## 2022-10-12 DIAGNOSIS — E039 Hypothyroidism, unspecified: Secondary | ICD-10-CM | POA: Diagnosis present

## 2022-10-12 DIAGNOSIS — Z79899 Other long term (current) drug therapy: Secondary | ICD-10-CM

## 2022-10-12 DIAGNOSIS — G893 Neoplasm related pain (acute) (chronic): Secondary | ICD-10-CM | POA: Diagnosis present

## 2022-10-12 DIAGNOSIS — R451 Restlessness and agitation: Secondary | ICD-10-CM | POA: Diagnosis not present

## 2022-10-12 DIAGNOSIS — Z981 Arthrodesis status: Secondary | ICD-10-CM

## 2022-10-12 DIAGNOSIS — C50312 Malignant neoplasm of lower-inner quadrant of left female breast: Secondary | ICD-10-CM | POA: Diagnosis present

## 2022-10-12 DIAGNOSIS — R4182 Altered mental status, unspecified: Secondary | ICD-10-CM | POA: Diagnosis not present

## 2022-10-12 DIAGNOSIS — Z8249 Family history of ischemic heart disease and other diseases of the circulatory system: Secondary | ICD-10-CM

## 2022-10-12 DIAGNOSIS — F064 Anxiety disorder due to known physiological condition: Secondary | ICD-10-CM | POA: Diagnosis present

## 2022-10-12 DIAGNOSIS — R110 Nausea: Secondary | ICD-10-CM | POA: Diagnosis not present

## 2022-10-12 DIAGNOSIS — E876 Hypokalemia: Secondary | ICD-10-CM | POA: Diagnosis present

## 2022-10-12 DIAGNOSIS — G9389 Other specified disorders of brain: Secondary | ICD-10-CM | POA: Diagnosis not present

## 2022-10-12 DIAGNOSIS — Z79890 Hormone replacement therapy (postmenopausal): Secondary | ICD-10-CM

## 2022-10-12 DIAGNOSIS — Z7901 Long term (current) use of anticoagulants: Secondary | ICD-10-CM

## 2022-10-12 DIAGNOSIS — Z808 Family history of malignant neoplasm of other organs or systems: Secondary | ICD-10-CM

## 2022-10-12 DIAGNOSIS — Z923 Personal history of irradiation: Secondary | ICD-10-CM

## 2022-10-12 DIAGNOSIS — F17210 Nicotine dependence, cigarettes, uncomplicated: Secondary | ICD-10-CM | POA: Diagnosis present

## 2022-10-12 DIAGNOSIS — R7401 Elevation of levels of liver transaminase levels: Secondary | ICD-10-CM | POA: Diagnosis present

## 2022-10-12 DIAGNOSIS — R4701 Aphasia: Secondary | ICD-10-CM | POA: Diagnosis present

## 2022-10-12 DIAGNOSIS — Z803 Family history of malignant neoplasm of breast: Secondary | ICD-10-CM

## 2022-10-12 DIAGNOSIS — C799 Secondary malignant neoplasm of unspecified site: Secondary | ICD-10-CM

## 2022-10-12 DIAGNOSIS — M542 Cervicalgia: Secondary | ICD-10-CM | POA: Diagnosis present

## 2022-10-12 DIAGNOSIS — Z66 Do not resuscitate: Secondary | ICD-10-CM | POA: Diagnosis not present

## 2022-10-12 DIAGNOSIS — Z9079 Acquired absence of other genital organ(s): Secondary | ICD-10-CM

## 2022-10-12 DIAGNOSIS — R1310 Dysphagia, unspecified: Secondary | ICD-10-CM | POA: Diagnosis present

## 2022-10-12 DIAGNOSIS — Z681 Body mass index (BMI) 19 or less, adult: Secondary | ICD-10-CM

## 2022-10-12 DIAGNOSIS — Z885 Allergy status to narcotic agent status: Secondary | ICD-10-CM

## 2022-10-12 DIAGNOSIS — E43 Unspecified severe protein-calorie malnutrition: Secondary | ICD-10-CM | POA: Diagnosis present

## 2022-10-12 DIAGNOSIS — Z1152 Encounter for screening for COVID-19: Secondary | ICD-10-CM

## 2022-10-12 DIAGNOSIS — F32A Depression, unspecified: Secondary | ICD-10-CM

## 2022-10-12 DIAGNOSIS — C7931 Secondary malignant neoplasm of brain: Secondary | ICD-10-CM | POA: Diagnosis present

## 2022-10-12 DIAGNOSIS — K5900 Constipation, unspecified: Secondary | ICD-10-CM | POA: Diagnosis not present

## 2022-10-12 DIAGNOSIS — R64 Cachexia: Secondary | ICD-10-CM | POA: Diagnosis present

## 2022-10-12 DIAGNOSIS — Z23 Encounter for immunization: Secondary | ICD-10-CM

## 2022-10-12 DIAGNOSIS — Z170 Estrogen receptor positive status [ER+]: Secondary | ICD-10-CM

## 2022-10-12 DIAGNOSIS — Z9221 Personal history of antineoplastic chemotherapy: Secondary | ICD-10-CM

## 2022-10-12 LAB — CBC WITH DIFFERENTIAL/PLATELET
Abs Immature Granulocytes: 0.31 10*3/uL — ABNORMAL HIGH (ref 0.00–0.07)
Basophils Absolute: 0.1 10*3/uL (ref 0.0–0.1)
Basophils Relative: 1 %
Eosinophils Absolute: 0 10*3/uL (ref 0.0–0.5)
Eosinophils Relative: 0 %
HCT: 36.9 % (ref 36.0–46.0)
Hemoglobin: 12.6 g/dL (ref 12.0–15.0)
Immature Granulocytes: 4 %
Lymphocytes Relative: 4 %
Lymphs Abs: 0.3 10*3/uL — ABNORMAL LOW (ref 0.7–4.0)
MCH: 31.7 pg (ref 26.0–34.0)
MCHC: 34.1 g/dL (ref 30.0–36.0)
MCV: 92.7 fL (ref 80.0–100.0)
Monocytes Absolute: 0.6 10*3/uL (ref 0.1–1.0)
Monocytes Relative: 7 %
Neutro Abs: 7.1 10*3/uL (ref 1.7–7.7)
Neutrophils Relative %: 84 %
Platelets: 128 10*3/uL — ABNORMAL LOW (ref 150–400)
RBC: 3.98 MIL/uL (ref 3.87–5.11)
RDW: 13.8 % (ref 11.5–15.5)
WBC: 8.4 10*3/uL (ref 4.0–10.5)
nRBC: 0 % (ref 0.0–0.2)

## 2022-10-12 LAB — RESP PANEL BY RT-PCR (RSV, FLU A&B, COVID)  RVPGX2
Influenza A by PCR: NEGATIVE
Influenza B by PCR: NEGATIVE
Resp Syncytial Virus by PCR: NEGATIVE
SARS Coronavirus 2 by RT PCR: NEGATIVE

## 2022-10-12 LAB — BLOOD GAS, VENOUS
Acid-Base Excess: 5.8 mmol/L — ABNORMAL HIGH (ref 0.0–2.0)
Bicarbonate: 29.8 mmol/L — ABNORMAL HIGH (ref 20.0–28.0)
O2 Saturation: 85.4 %
Patient temperature: 37
pCO2, Ven: 40 mmHg — ABNORMAL LOW (ref 44–60)
pH, Ven: 7.48 — ABNORMAL HIGH (ref 7.25–7.43)
pO2, Ven: 51 mmHg — ABNORMAL HIGH (ref 32–45)

## 2022-10-12 LAB — URINALYSIS, W/ REFLEX TO CULTURE (INFECTION SUSPECTED)
Bilirubin Urine: NEGATIVE
Glucose, UA: NEGATIVE mg/dL
Hgb urine dipstick: NEGATIVE
Ketones, ur: 5 mg/dL — AB
Leukocytes,Ua: NEGATIVE
Nitrite: NEGATIVE
Protein, ur: NEGATIVE mg/dL
Specific Gravity, Urine: 1.017 (ref 1.005–1.030)
pH: 5 (ref 5.0–8.0)

## 2022-10-12 LAB — COMPREHENSIVE METABOLIC PANEL
ALT: 29 U/L (ref 0–44)
AST: 102 U/L — ABNORMAL HIGH (ref 15–41)
Albumin: 3.8 g/dL (ref 3.5–5.0)
Alkaline Phosphatase: 723 U/L — ABNORMAL HIGH (ref 38–126)
Anion gap: 14 (ref 5–15)
BUN: 23 mg/dL (ref 8–23)
CO2: 25 mmol/L (ref 22–32)
Calcium: 12.7 mg/dL — ABNORMAL HIGH (ref 8.9–10.3)
Chloride: 93 mmol/L — ABNORMAL LOW (ref 98–111)
Creatinine, Ser: 0.5 mg/dL (ref 0.44–1.00)
GFR, Estimated: >60 mL/min (ref >60)
Glucose, Bld: 104 mg/dL — ABNORMAL HIGH (ref 70–99)
Potassium: 3.4 mmol/L — ABNORMAL LOW (ref 3.5–5.1)
Sodium: 132 mmol/L — ABNORMAL LOW (ref 135–145)
Total Bilirubin: 1.2 mg/dL (ref 0.3–1.2)
Total Protein: 6.7 g/dL (ref 6.5–8.1)

## 2022-10-12 LAB — PROCALCITONIN: Procalcitonin: 0.1 ng/mL

## 2022-10-12 LAB — PROTIME-INR
INR: 1.3 — ABNORMAL HIGH (ref 0.8–1.2)
Prothrombin Time: 15.8 seconds — ABNORMAL HIGH (ref 11.4–15.2)

## 2022-10-12 LAB — AMMONIA: Ammonia: 23 umol/L (ref 9–35)

## 2022-10-12 LAB — TSH: TSH: 1.768 u[IU]/mL (ref 0.350–4.500)

## 2022-10-12 LAB — TROPONIN I (HIGH SENSITIVITY): Troponin I (High Sensitivity): 10 ng/L (ref <18)

## 2022-10-12 LAB — APTT: aPTT: 38 seconds — ABNORMAL HIGH (ref 24–36)

## 2022-10-12 LAB — LACTIC ACID, PLASMA: Lactic Acid, Venous: 1.4 mmol/L (ref 0.5–1.9)

## 2022-10-12 MED ORDER — ORAL CARE MOUTH RINSE: 15.0000 mL | OROMUCOSAL | Status: DC | PRN

## 2022-10-12 MED ORDER — ONDANSETRON HCL 4 MG PO TABS: 4.0000 mg | ORAL_TABLET | Freq: Four times a day (QID) | ORAL | Status: DC | PRN

## 2022-10-12 MED ORDER — TRAZODONE HCL 50 MG PO TABS
25.0000 mg | ORAL_TABLET | Freq: Every evening | ORAL | Status: DC | PRN
  Administered 2022-10-12 – 2022-10-16 (×4): 25 mg via ORAL
  Filled 2022-10-12 (×5): qty 1

## 2022-10-12 MED ORDER — ACETAMINOPHEN 650 MG RE SUPP: 650.0000 mg | Freq: Four times a day (QID) | RECTAL | Status: DC | PRN

## 2022-10-12 MED ORDER — SODIUM CHLORIDE 0.9 % IV SOLN: INTRAVENOUS | Status: DC

## 2022-10-12 MED ORDER — CYCLOBENZAPRINE HCL 10 MG PO TABS
10.0000 mg | ORAL_TABLET | Freq: Three times a day (TID) | ORAL | Status: DC | PRN
  Administered 2022-10-12: 10 mg via ORAL
  Filled 2022-10-12: qty 1

## 2022-10-12 MED ORDER — POLYETHYLENE GLYCOL 3350 17 G PO PACK: 17.0000 g | PACK | Freq: Every day | ORAL | Status: DC | PRN

## 2022-10-12 MED ORDER — APIXABAN 2.5 MG PO TABS
2.5000 mg | ORAL_TABLET | Freq: Two times a day (BID) | ORAL | Status: DC
  Administered 2022-10-12 – 2022-10-20 (×17): 2.5 mg via ORAL
  Filled 2022-10-12 (×17): qty 1

## 2022-10-12 MED ORDER — CITALOPRAM HYDROBROMIDE 20 MG PO TABS
40.0000 mg | ORAL_TABLET | Freq: Every day | ORAL | Status: DC
  Administered 2022-10-12 – 2022-10-20 (×9): 40 mg via ORAL
  Filled 2022-10-12: qty 4
  Filled 2022-10-12: qty 2
  Filled 2022-10-12: qty 4
  Filled 2022-10-12 (×5): qty 2
  Filled 2022-10-12: qty 4
  Filled 2022-10-12 (×3): qty 2

## 2022-10-12 MED ORDER — ZOLEDRONIC ACID 4 MG/5ML IV CONC
4.0000 mg | Freq: Once | INTRAVENOUS | Status: DC
  Filled 2022-10-12: qty 5

## 2022-10-12 MED ORDER — ACETAMINOPHEN 325 MG PO TABS: 650.0000 mg | ORAL_TABLET | Freq: Four times a day (QID) | ORAL | Status: DC | PRN

## 2022-10-12 MED ORDER — ONDANSETRON HCL 4 MG/2ML IJ SOLN: 4.0000 mg | Freq: Four times a day (QID) | INTRAMUSCULAR | Status: DC | PRN

## 2022-10-12 MED ORDER — RISAQUAD PO CAPS
1.0000 | ORAL_CAPSULE | Freq: Every day | ORAL | Status: DC
  Administered 2022-10-12 – 2022-10-20 (×7): 1 via ORAL
  Filled 2022-10-12 (×8): qty 1

## 2022-10-12 MED ORDER — HYDROCODONE-ACETAMINOPHEN 5-325 MG PO TABS
1.0000 | ORAL_TABLET | ORAL | Status: DC | PRN
  Administered 2022-10-12 – 2022-10-16 (×9): 1 via ORAL
  Filled 2022-10-12 (×10): qty 1

## 2022-10-12 MED ORDER — LIDOCAINE VISCOUS HCL 2 % MT SOLN
15.0000 mL | OROMUCOSAL | Status: DC | PRN
  Filled 2022-10-12: qty 15

## 2022-10-12 MED ORDER — LEVOTHYROXINE SODIUM 50 MCG PO TABS
75.0000 ug | ORAL_TABLET | Freq: Every day | ORAL | Status: DC
  Administered 2022-10-13 – 2022-10-20 (×8): 75 ug via ORAL
  Filled 2022-10-12 (×8): qty 1

## 2022-10-12 MED ORDER — GABAPENTIN 100 MG PO CAPS
100.0000 mg | ORAL_CAPSULE | Freq: Three times a day (TID) | ORAL | Status: DC
  Administered 2022-10-12 – 2022-10-16 (×12): 100 mg via ORAL
  Filled 2022-10-12 (×12): qty 1

## 2022-10-12 MED ORDER — SENNOSIDES-DOCUSATE SODIUM 8.6-50 MG PO TABS
1.0000 | ORAL_TABLET | Freq: Two times a day (BID) | ORAL | Status: DC
  Administered 2022-10-12 – 2022-10-19 (×12): 1 via ORAL
  Filled 2022-10-12 (×16): qty 1

## 2022-10-12 MED ORDER — ZOLEDRONIC ACID 4 MG/100ML IV SOLN
4.0000 mg | Freq: Once | INTRAVENOUS | Status: AC
  Administered 2022-10-12: 4 mg via INTRAVENOUS
  Filled 2022-10-12: qty 100

## 2022-10-12 NOTE — Evaluation (Signed)
Physical Therapy Evaluation Patient Details Name: Martha [name] MRN: [id number] DOB: 10-06-1960 Today's Date: 10/12/2022  History of Present Illness  Martha [name] is a 62 y.o. female admitted with hallucination. 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  Clinical Impression  Pt admitted with above diagnosis. Pt recently d/c from CIR, became increasingly painful and decreasingly active, as well as having hallucinations per pt report. Pt needing min-mod A with bed mobility and transfers, significantly increased time and cues to minimize back pain. Pt able to clear feet for a few steps at bedside but strong posterior lean lacking hip extension and needing mod A from therapist to maintain standing with transfers. Pt reports pain and "attacks" limiting mobility, pt appears to lack insight to posture and tenses up with pain needing heavy cues to avoid breath holding and for posture. Spouse and pt hopeful that new radiation will improve pain thus improve pt's function. Recommending HHPT at f/u, if no improvement in pain and function, may need to reconsider d/c planning. Pt currently with functional limitations due to the deficits listed below (see PT Problem List). Pt will benefit from skilled PT to increase their independence and safety with mobility to allow discharge to the venue listed below.          Recommendations for follow up therapy are one component of a multi-disciplinary discharge planning process, led by the attending physician.  Recommendations may be updated based on patient status, additional functional criteria and insurance authorization.  Follow Up Recommendations Home health PT      Assistance Recommended at Discharge Frequent or constant Supervision/Assistance  Patient can return home with the following  A little help with  walking and/or transfers;A little help with bathing/dressing/bathroom;Assistance with cooking/housework;Assist for transportation;Help with stairs or ramp for entrance    Equipment Recommendations None recommended by PT  Recommendations for Other Services       Functional Status Assessment Patient has had a recent decline in their functional status and demonstrates the ability to make significant improvements in function in a reasonable and predictable amount of time.     Precautions / Restrictions Precautions Precautions: Cervical;Fall Required Braces or Orthoses: Cervical Brace (pt wearing collar, no order in chart) Cervical Brace: Hard collar Restrictions Weight Bearing Restrictions: No      Mobility  Bed Mobility Overal bed mobility: Needs Assistance Bed Mobility: Rolling, Sidelying to Sit, Sit to Sidelying Rolling: Min assist Sidelying to sit: Mod assist  Sit to sidelying: Mod assist General bed mobility comments: min A to come to sidelying, mod A to upright trunk into sitting with pt attempting to pull on therapist's hands but ultimately needing assist with bedpad, mod A to return to supine through log rolling    Transfers Overall transfer level: Needs assistance Equipment used: 1 person hand held assist Transfers: Sit to/from Stand, Bed to chair/wheelchair/BSC Sit to Stand: Mod assist, Min assist   Step pivot transfers: Mod assist  General transfer comment: min-mod A to power up to standing with therapist positioned anterior to pt in bear hug position, attempted STS with RW but pt maintains weight posterior with feed sliding forward; completed bed<>recliner transfer wtih mod A, heavy cues for hand placement, shifting weight anterior and upright posture as able.    Ambulation/Gait  General Gait Details: able to clear feet for 2-3 steps at bedside with mod A and therapist positioned anterior to pt  Stairs            Wheelchair Mobility    Modified Rankin  (Stroke Patients Only)       Balance Overall balance assessment: Needs assistance Sitting-balance support: Feet supported, Bilateral upper extremity supported Sitting balance-Leahy Scale: Poor Sitting balance - Comments: min A to maintain static, supported sitting due to posterior lean Postural control: Posterior lean Standing balance support: During functional activity, Bilateral upper extremity supported Standing balance-Leahy Scale: Poor Standing balance comment: mod A from therapist due to posterior lean       Pertinent Vitals/Pain      Home Living Family/patient expects to be discharged to:: Private residence Living Arrangements: Spouse/significant other Available Help at Discharge: Family;Friend(s);Available PRN/intermittently (Husband works, has flexible job hours, lots of family and friends available to assist) Type of Home: House Home Access: Stairs to enter Entrance Stairs-Rails: Psychiatric nurse of Steps: 2 Alternate Level Stairs-Number of Steps: 1 step down into living room, with grab bar Home Layout: One [place]: [place] (4 wheels);Rolling Walker (2 wheels);Wheelchair - Sport and exercise psychologist Comments: Has RW, rollator, WC from her mother, shower chair and adjustable bed (HOB and foot of bed but not height wise), lift chair    Prior Function Prior Level of Function : Needs assist  Mobility Comments: pt ambulating short distances with and without RW since d/c from CIR, spouse present to assist as needed ADLs Comments: spouse assisting as needed since d/c from [place]   Dominant Hand: Right    Extremity/Trunk Assessment   Upper Extremity Assessment Upper Extremity Assessment: Defer to OT evaluation    Lower Extremity Assessment Lower Extremity Assessment: Generalized weakness (AROM WFL, strength grossly 3+/5, symmetrical, denies numbness/tingling throughout)    Cervical / Trunk Assessment Cervical /  Trunk Assessment: Normal  Communication   Communication: No difficulties  Cognition Arousal/Alertness: Awake/alert Behavior During Therapy: WFL for tasks assessed/performed Overall Cognitive Status: Within Functional Limits for tasks assessed  General Comments: pt pleasant, able to follow commands appropriately, reports having an "attack" upon arrival but it improves during session        General Comments      Exercises     Assessment/Plan    PT Assessment Patient needs continued PT services  PT Problem List Decreased strength;Decreased activity tolerance;Decreased balance;Decreased mobility;Decreased cognition;Decreased knowledge of use of DME;Decreased safety awareness;Pain       PT Treatment Interventions DME instruction;Gait training;Functional mobility training;Therapeutic activities;Therapeutic exercise;Balance training;Neuromuscular re-education;Patient/family education    PT Goals (Current goals can be found in the Care Plan section)  Acute Rehab PT Goals Patient Stated Goal: less pain, regain strength PT Goal Formulation: With patient/family Time For Goal Achievement: 10/26/22 Potential to Achieve Goals: Good    Frequency Min 3X/week     Co-evaluation               AM-PAC PT "6 Clicks" Mobility  Outcome Measure Help needed turning from your back to your side while in a flat bed without using bedrails?: A Lot Help needed moving from lying on your back to sitting on the side of a flat bed without using bedrails?: A Lot Help needed moving to and from a bed to a chair (including a wheelchair)?: A Lot Help needed standing up from a chair using your arms (e.g., wheelchair or bedside chair)?: A Lot Help needed to walk in hospital room?: Total Help needed climbing 3-5 steps with a railing? : Total 6 Click Score: 10  End of Session Equipment Utilized During Treatment: Gait belt Activity Tolerance: Patient tolerated treatment well Patient left: in bed;with  call bell/phone within reach;with bed alarm set;with family/visitor present Nurse Communication: Mobility status PT Visit Diagnosis: Other abnormalities of gait and mobility (R26.89);Muscle weakness (generalized) (M62.81);Difficulty in walking, not elsewhere classified (R26.2);Other symptoms and signs involving the nervous system [id number])    Time: [id number] PT Time Calculation (min) (ACUTE ONLY): 41 min   Charges:   PT Evaluation $PT Eval Moderate Complexity: 1 Mod PT Treatments $Therapeutic Activity: 8-22 mins         Tori Elena Davia PT, DPT 10/12/22, 2:21 PM

## 2022-10-12 NOTE — ED Notes (Signed)
Lab called to add on urine culture.  ?

## 2022-10-12 NOTE — Progress Notes (Signed)
Pharmacist Chemotherapy Monitoring - Initial Assessment    Anticipated start date: 10/19/22   The following has been reviewed per standard work regarding the patient's treatment regimen: The patient's diagnosis, treatment plan and drug doses, and organ/hematologic function Lab orders and baseline tests specific to treatment regimen  The treatment plan start date, drug sequencing, and pre-medications Prior authorization status  Patient's documented medication list, including drug-drug interaction screen and prescriptions for anti-emetics and supportive care specific to the treatment regimen The drug concentrations, fluid compatibility, administration routes, and timing of the medications to be used The patient's access for treatment and lifetime cumulative dose history, if applicable  The patient's medication allergies and previous infusion related reactions, if applicable   Changes made to treatment plan:  N/A  Follow up needed:  Need ECHO sch and resulted, sent [place] to RN   Judge Stall, [place], 10/12/2022  12:51 PM

## 2022-10-12 NOTE — ED Provider Notes (Signed)
[place] EMERGENCY DEPARTMENT AT [facility] Provider Note   CSN: [id number] Arrival date & time: 10/12/22  0216     History  Chief Complaint  Patient presents with   Back Pain    Martha [name] is a 62 y.o. female.  62 year old female presents today for evaluation of altered mental status.  Her husband is at bedside.  He states over the past few days patient has been more weak, has been hallucinating which is not usual for her.  No fever.  She does have history of metastatic breast cancer with mets to the brain, and spine.  Completed her radiation to the cervical spine yesterday.  Denies dysuria, abdominal pain, nausea, vomiting.  She is overall Norco, Flexeril for pain.  Patient states when she is laying in bed she gets the sensation that she is left with taking up to the ceiling and then suddenly falls down.  Her husband states that she was not able to recognize him earlier tonight.  She also has been seeing things that are on the ceiling that her husband states or not.  The history is provided by the patient and the spouse. No language interpreter was used.       Home Medications Prior to Admission medications   Medication Sig Start Date End Date Taking? Authorizing Provider  apixaban (ELIQUIS) 2.5 MG TABS tablet Take 1 tablet (2.5 mg total) by mouth 2 (two) times daily. 09/26/22   Love, Ivan Anchors, PA-C  ascorbic acid (VITAMIN C) 1000 MG tablet Take 1 tablet (1,000 mg total) by mouth daily. 09/26/22   Love, Ivan Anchors, PA-C  citalopram (CELEXA) 40 MG tablet Take 1 tablet (40 mg total) by mouth daily. 09/26/22   Love, Ivan Anchors, PA-C  cyclobenzaprine (FLEXERIL) 10 MG tablet Take 1 tablet (10 mg total) by mouth 3 (three) times daily as needed for muscle spasms. 09/26/22   Love, Ivan Anchors, PA-C  gabapentin (NEURONTIN) 100 MG capsule Take 2 capsules (200 mg total) by mouth 3 (three) times daily. 10/02/22   Truitt Merle, MD  HYDROcodone-acetaminophen (NORCO/VICODIN) 5-325 MG tablet  Take 1 tablet by mouth every 4 (four) hours as needed for severe pain. 09/26/22   Love, Ivan Anchors, PA-C  levothyroxine (SYNTHROID) 75 MCG tablet Take 1 tablet (75 mcg total) by mouth daily before breakfast. 09/26/22   Love, Ivan Anchors, PA-C  lidocaine (XYLOCAINE) 2 % solution Patient: Mix 1part 2% viscous lidocaine, 1part H20. Swallow 95m of diluted mixture, 361m before meals and at bedtime, up to QID 10/02/22   SqEppie GibsonMD  lidocaine-prilocaine (EMLA) cream Apply to affected area once 10/02/22   FeTruitt MerleMD  Mouthwashes (MOUTH RINSE) LIQD solution 15 mLs by Mouth Rinse route as needed (for oral care). 09/26/22   Love, PaIvan AnchorsPA-C  ondansetron (ZOFRAN) 8 MG tablet Take 1 tablet (8 mg total) by mouth every 8 (eight) hours as needed for nausea or vomiting. 10/02/22   FeTruitt MerleMD  OVER THE COUNTER MEDICATION Take 1 tablet by mouth daily. Protandim Supplement    [provider]  Probiotic Product (PROBIOTIC PO) Take 1 capsule by mouth daily.    [provider]  prochlorperazine (COMPAZINE) 10 MG tablet Take 1 tablet (10 mg total) by mouth every 6 (six) hours as needed for nausea or vomiting. 10/02/22   FeTruitt MerleMD  senna-docusate (SENOKOT-S) 8.6-50 MG tablet Take 1 tablet by mouth 2 (two) times daily. 09/26/22   LoBary LerichePA-C  TYLENOL  500 MG tablet Take 500 mg by mouth every 6 (six) hours as needed for mild pain or headache.    [provider]      Allergies    Morphine and related    Review of Systems   Review of Systems  Constitutional:  Negative for activity change and fever.  Respiratory:  Negative for shortness of breath.   Cardiovascular:  Negative for chest pain.  Gastrointestinal:  Negative for abdominal pain.  Genitourinary:  Negative for dysuria.  Neurological:  Negative for light-headedness and headaches.  Psychiatric/Behavioral:  Positive for confusion.   All other systems reviewed and are negative.   Physical Exam Updated Vital Signs BP  119/70   Pulse (!) 101   Temp 97.6 F (36.4 C) (Oral)   Resp (!) 24   Ht 5' 2"$  (1.575 m)   Wt 40.3 kg   SpO2 93%   BMI 16.25 kg/m  Physical Exam Vitals and nursing note reviewed.  Constitutional:      General: She is not in acute distress.    Appearance: Normal appearance. She is not ill-appearing.  HENT:     Head: Normocephalic and atraumatic.     Nose: Nose normal.  Eyes:     General: No scleral icterus.    Extraocular Movements: Extraocular movements intact.     Conjunctiva/sclera: Conjunctivae normal.  Cardiovascular:     Rate and Rhythm: Normal rate and regular rhythm.     Pulses: Normal pulses.  Pulmonary:     Effort: Pulmonary effort is normal. No respiratory distress.     Breath sounds: Normal breath sounds. No wheezing or rales.  Abdominal:     General: There is no distension.     Palpations: Abdomen is soft.     Tenderness: There is no abdominal tenderness. There is no guarding.  Musculoskeletal:        General: Normal range of motion.     Cervical back: Normal range of motion.     Right lower leg: No edema.     Left lower leg: No edema.  Skin:    General: Skin is warm and dry.  Neurological:     General: No focal deficit present.     Mental Status: She is alert. Mental status is at baseline.     ED Results / Procedures / Treatments   Labs (all labs ordered are listed, but only abnormal results are displayed) Labs Reviewed  COMPREHENSIVE METABOLIC PANEL - Abnormal; Notable for the following components:      Result Value   Sodium 132 (*)    Potassium 3.4 (*)    Chloride 93 (*)    Glucose, Bld 104 (*)    Calcium 12.7 (*)    AST 102 (*)    Alkaline Phosphatase 723 (*)    All other components within normal limits  CBC WITH DIFFERENTIAL/PLATELET - Abnormal; Notable for the following components:   Platelets 128 (*)    Lymphs Abs 0.3 (*)    Abs Immature Granulocytes 0.31 (*)    All other components within normal limits  URINALYSIS, W/ REFLEX TO  CULTURE (INFECTION SUSPECTED) - Abnormal; Notable for the following components:   APPearance HAZY (*)    Ketones, ur 5 (*)    Bacteria, UA RARE (*)    All other components within normal limits  RESP PANEL BY RT-PCR (RSV, FLU A&B, COVID)  RVPGX2  CULTURE, BLOOD (ROUTINE X 2)  CULTURE, BLOOD (ROUTINE X 2)  LACTIC ACID, PLASMA  PROTIME-INR  APTT  TROPONIN I (HIGH SENSITIVITY)    EKG EKG Interpretation  Date/Time:  Thursday October 12 2022 04:33:55 EST Ventricular Rate:  97 PR Interval:  130 QRS Duration: 77 QT Interval:  344 QTC Calculation: 437 R Axis:   118 Text Interpretation: Right and left arm electrode reversal, interpretation assumes no reversal Sinus rhythm LAE, consider biatrial enlargement Probable lateral infarct, age indeterminate No significant change was found Confirmed by Ezequiel Essex [phone]) on 10/12/2022 5:43:34 AM  Radiology CT Head Wo Contrast  Result Date: 10/12/2022 CLINICAL DATA:  Mental status change with unknown cause EXAM: CT HEAD WITHOUT CONTRAST TECHNIQUE: Contiguous axial images were obtained from the base of the skull through the vertex without intravenous contrast. RADIATION DOSE REDUCTION: This exam was performed according to the departmental dose-optimization program which includes automated exposure control, adjustment of the mA and/or kV according to patient size and/or use of iterative reconstruction technique. COMPARISON:  Brain MRI 09/13/2022 FINDINGS: Brain: No evidence of acute infarction, hemorrhage, hydrocephalus, extra-axial collection or mass lesion/mass effect. Essentially occult metastatic disease to the brain. Reference postcontrast brain MRI 09/13/2022 Vascular: No hyperdense vessel or unexpected calcification. Skull: Heterogeneity of the calvarium from known metastatic disease. No acute osseous finding. Extensive cervical fusion which is minimally covered Sinuses/Orbits: No acute finding IMPRESSION: No acute or interval finding.  Electronically Signed   By: Jorje Guild M.D.   On: 10/12/2022 05:36   DG CHEST PORT 1 VIEW  Result Date: 10/12/2022 CLINICAL DATA:  Altered mental status.  History of breast cancer EXAM: PORTABLE CHEST 1 VIEW COMPARISON:  PET CT 08/03/2022 FINDINGS: Hazy and streaky right density at the lung bases from scarring based on PET CT 08/03/2022. Trace right pleural effusion. Generous left ventricular contour extension weighted by rotation. Atheromatous calcification of the aorta. Postoperative breasts and left axilla. Scoliosis and spinal degeneration with cervical fusion. IMPRESSION: No acute finding when accounting for scarring at the lung bases with trace right pleural effusion. Electronically Signed   By: Jorje Guild M.D.   On: 10/12/2022 04:40    Procedures Procedures    Medications Ordered in ED Medications - No data to display  ED Course/ Medical Decision Making/ A&P                             Medical Decision Making Amount and/or Complexity of Data Reviewed Labs: ordered. Radiology: ordered. ECG/medicine tests: ordered.   Medical Decision Making / ED Course   This patient presents to the ED for concern of altered mental status, this involves an extensive number of treatment options, and is a complaint that carries with it a high risk of complications and morbidity.  The differential diagnosis includes infection, metabolic encephalopathy, worsening metastatic disease, medication side effect  MDM: 62 year old female presents today for evaluation of above-mentioned symptoms.  Overall she is well-appearing.  She does have a hard cervical collar in place.  CBC without leukocytosis or anemia.  CMP without acute concerns.  Alk phos elevated but consistent with recent previous readings.  UA without evidence of UTI.  Troponin of 10.  Lactic acid within normal.  COVID flu, and RSV negative.  Blood cultures obtained.  Chest x-ray without acute cardiopulmonary finding.  CT head without  contrast without acute intracranial finding.  Given the change in mental status along with weakness will discuss with hospitalist for admission.  Discussed with hospitalist who recommends obtaining MRI and they will evaluate for admission.  Lab Tests: -  I ordered, reviewed, and interpreted labs.   The pertinent results include:   Labs Reviewed  COMPREHENSIVE METABOLIC PANEL - Abnormal; Notable for the following components:      Result Value   Sodium 132 (*)    Potassium 3.4 (*)    Chloride 93 (*)    Glucose, Bld 104 (*)    Calcium 12.7 (*)    AST 102 (*)    Alkaline Phosphatase 723 (*)    All other components within normal limits  CBC WITH DIFFERENTIAL/PLATELET - Abnormal; Notable for the following components:   Platelets 128 (*)    Lymphs Abs 0.3 (*)    Abs Immature Granulocytes 0.31 (*)    All other components within normal limits  URINALYSIS, W/ REFLEX TO CULTURE (INFECTION SUSPECTED) - Abnormal; Notable for the following components:   APPearance HAZY (*)    Ketones, ur 5 (*)    Bacteria, UA RARE (*)    All other components within normal limits  RESP PANEL BY RT-PCR (RSV, FLU A&B, COVID)  RVPGX2  CULTURE, BLOOD (ROUTINE X 2)  CULTURE, BLOOD (ROUTINE X 2)  LACTIC ACID, PLASMA  PROTIME-INR  APTT  TROPONIN I (HIGH SENSITIVITY)      EKG  EKG Interpretation  Date/Time:  Thursday October 12 2022 04:33:55 EST Ventricular Rate:  97 PR Interval:  130 QRS Duration: 77 QT Interval:  344 QTC Calculation: 437 R Axis:   118 Text Interpretation: Right and left arm electrode reversal, interpretation assumes no reversal Sinus rhythm LAE, consider biatrial enlargement Probable lateral infarct, age indeterminate No significant change was found Confirmed by Ezequiel Essex [id number] on 10/12/2022 5:43:34 AM         Imaging Studies ordered: I ordered imaging studies including CT head, chest x-ray.  MRI ordered but not resulted at the end of my shift, and at the time of patient's  admission I independently visualized and interpreted imaging. I agree with the radiologist interpretation   Medicines ordered and prescription drug management: No orders of the defined types were placed in this encounter.   -I have reviewed the patients home medicines and have made adjustments as needed  Reevaluation: After the interventions noted above, I reevaluated the patient and found that they have :stayed the same  Co morbidities that complicate the patient evaluation  Past Medical History:  Diagnosis Date   Anxiety    Cancer ([place]) 2022   right breast LCIS   Cancer ([place]) 2019   left breast   Depression    Dysrhythmia    SVT, s/p ablation ~ 2012 in at [facility]   Ectopic pregnancy    Family history of breast cancer    Family history of melanoma    History of radiation therapy 04/22/18-06/03/18   Left Breast, left SCV, axilla 50 Gy in 25 fractions, Left breast boost 10 Gy in 5 fractions.    Hypothyroidism    Personal history of radiation therapy    PONV (postoperative nausea and vomiting)    Thyroid disease       Dispostion: Discussed with hospitalist will evaluate for admission  Final Clinical Impression(s) / ED Diagnoses Final diagnoses:  Altered mental status, unspecified altered mental status type    Rx / DC Orders ED Discharge Orders     None         Evlyn Courier, PA-C 10/12/22 [id number]    Ezequiel Essex, MD 10/13/22 [phone]

## 2022-10-12 NOTE — Progress Notes (Addendum)
Martha [name]   DOB:03/10/1961   [id number]   [phone]  Medical oncology follow-up  Subjective: Patient is well-known to me, under my care for her metastatic breast cancer.  She was admitted for hallucination, dizziness and confusion last night.  She is feeling better now, alert, oriented, and answers questions appropriately.  Her husband is at the bedside.  Objective:  Vitals:   10/12/22 0913 10/12/22 1410  BP: 127/75 117/66  Pulse: (!) 105 (!) 101  Resp: 19 20  Temp: 98 F (36.7 C) 98.5 F (36.9 C)  SpO2: 97% 95%    Body mass index is 16.25 kg/m.  Intake/Output Summary (Last 24 hours) at 10/12/2022 1805 Last data filed at 10/12/2022 1500 Gross per 24 hour  Intake 321.17 ml  Output --  Net 321.17 ml     Sclerae unicteric  Oropharynx clear  (+) cervical collar   Neuro nonfocal    CBG (last 3)  No results for input(s): "GLUCAP" in the last 72 hours.   Labs:  Urine Studies No results for input(s): "UHGB", "CRYS" in the last 72 hours.  Invalid input(s): "UACOL", "UAPR", "USPG", "UPH", "UTP", "UGL", "UKET", "UBIL", "UNIT", "UROB", "ULEU", "UEPI", "UWBC", "URBC", "UBAC", "CAST", "UCOM", "BILUA"  Basic Metabolic Panel: Recent Labs  Lab 10/12/22 0445  NA 132*  K 3.4*  CL 93*  CO2 25  GLUCOSE 104*  BUN 23  CREATININE 0.50  CALCIUM 12.7*   GFR Estimated Creatinine Clearance: 47 mL/min (by C-G formula based on SCr of 0.5 mg/dL). Liver Function Tests: Recent Labs  Lab 10/12/22 0445  AST 102*  ALT 29  ALKPHOS 723*  BILITOT 1.2  PROT 6.7  ALBUMIN 3.8   No results for input(s): "LIPASE", "AMYLASE" in the last 168 hours. Recent Labs  Lab 10/12/22 0652  AMMONIA 23   Coagulation profile Recent Labs  Lab 10/12/22 0652  INR 1.3*    CBC: Recent Labs  Lab 10/12/22 0445  WBC 8.4  NEUTROABS 7.1  HGB 12.6  HCT 36.9  MCV 92.7  PLT 128*   Cardiac Enzymes: No results for input(s): "CKTOTAL", "CKMB", "CKMBINDEX", "TROPONINI" in the last  168 hours. BNP: Invalid input(s): "POCBNP" CBG: No results for input(s): "GLUCAP" in the last 168 hours. D-Dimer No results for input(s): "DDIMER" in the last 72 hours. Hgb A1c No results for input(s): "HGBA1C" in the last 72 hours. Lipid Profile No results for input(s): "CHOL", "HDL", "LDLCALC", "TRIG", "CHOLHDL", "LDLDIRECT" in the last 72 hours. Thyroid function studies Recent Labs    10/12/22 0652  TSH 1.768   Anemia work up No results for input(s): "VITAMINB12", "FOLATE", "FERRITIN", "TIBC", "IRON", "RETICCTPCT" in the last 72 hours. Microbiology Recent Results (from the past 240 hour(s))  Resp panel by RT-PCR (RSV, Flu A&B, Covid) Anterior Nasal Swab     Status: None   Collection Time: 10/12/22  4:35 AM   Specimen: Anterior Nasal Swab  Result Value Ref Range Status   SARS Coronavirus 2 by RT PCR NEGATIVE NEGATIVE Final    Comment: (NOTE) SARS-CoV-2 target nucleic acids are NOT DETECTED.  The SARS-CoV-2 RNA is generally detectable in upper respiratory specimens during the acute phase of infection. The lowest concentration of SARS-CoV-2 viral copies this assay can detect is 138 copies/mL. A negative result does not preclude SARS-Cov-2 infection and should not be used as the sole basis for treatment or other patient management decisions. A negative result may occur with  improper specimen collection/handling, submission of specimen other than nasopharyngeal swab, presence of  viral mutation(s) within the areas targeted by this assay, and inadequate number of viral copies(<138 copies/mL). A negative result must be combined with clinical observations, patient history, and epidemiological information. The expected result is Negative.  Fact Sheet for Patients:  EntrepreneurPulse.com.au  Fact Sheet for Healthcare Providers:  IncredibleEmployment.be  This test is no t yet approved or cleared by the [place] FDA and  has been  authorized for detection and/or diagnosis of SARS-CoV-2 by FDA under an Emergency Use Authorization (EUA). This EUA will remain  in effect (meaning this test can be used) for the duration of the COVID-19 declaration under Section 564(b)(1) of the Act, 21 U.S.C.section 360bbb-3(b)(1), unless the authorization is terminated  or revoked sooner.       Influenza A by PCR NEGATIVE NEGATIVE Final   Influenza B by PCR NEGATIVE NEGATIVE Final    Comment: (NOTE) The Xpert Xpress SARS-CoV-2/FLU/RSV plus assay is intended as an aid in the diagnosis of influenza from Nasopharyngeal swab specimens and should not be used as a sole basis for treatment. Nasal washings and aspirates are unacceptable for Xpert Xpress SARS-CoV-2/FLU/RSV testing.  Fact Sheet for Patients: EntrepreneurPulse.com.au  Fact Sheet for Healthcare Providers: IncredibleEmployment.be  This test is not yet approved or cleared by the [place] FDA and has been authorized for detection and/or diagnosis of SARS-CoV-2 by FDA under an Emergency Use Authorization (EUA). This EUA will remain in effect (meaning this test can be used) for the duration of the COVID-19 declaration under Section 564(b)(1) of the Act, 21 U.S.C. section 360bbb-3(b)(1), unless the authorization is terminated or revoked.     Resp Syncytial Virus by PCR NEGATIVE NEGATIVE Final    Comment: (NOTE) Fact Sheet for Patients: EntrepreneurPulse.com.au  Fact Sheet for Healthcare Providers: IncredibleEmployment.be  This test is not yet approved or cleared by the [place] FDA and has been authorized for detection and/or diagnosis of SARS-CoV-2 by FDA under an Emergency Use Authorization (EUA). This EUA will remain in effect (meaning this test can be used) for the duration of the COVID-19 declaration under Section 564(b)(1) of the Act, 21 U.S.C. section 360bbb-3(b)(1), unless the  authorization is terminated or revoked.  Performed at [facility], [place] [address] [place], [place] [address]       Studies:  MR BRAIN WO CONTRAST  Result Date: 10/12/2022 CLINICAL DATA:  Mental status change, unknown cause EXAM: MRI HEAD WITHOUT CONTRAST TECHNIQUE: Multiplanar, multiecho pulse sequences of the brain and surrounding structures were obtained without intravenous contrast. COMPARISON:  MRI head 09/13/2022. FINDINGS: Noncontrast and motion limited study.  Within these limitations: Brain: The lesion at the left CP angle is grossly similar. Additional enhancing lesions seen on the prior cannot be accurately assessed without contrast. Consider postcontrast imaging. No significant new mass effect. No obvious evidence of acute infarct, acute hemorrhage, midline shift, hydrocephalus or sizeable extra-axial fluid collection. Vascular: Major arterial flow voids are maintained at the skull base. Skull and upper cervical spine: Extensive osseous metastatic disease. Sinuses/Orbits: Clear sinuses.  No acute findings. Other: No mastoid effusions. IMPRESSION: 1. Noncontrast and motion limited study. The lesion at the left CP angle is grossly similar. Additional enhancing lesions seen on the prior cannot be accurately assessed without contrast. Consider postcontrast imaging. 2. No significant new mass effect or other obvious acute abnormality. 3. Osseous metastatic disease. Electronically Signed   By: Margaretha Sheffield M.D.   On: 10/12/2022 12:35   CT Head Wo Contrast  Result Date: 10/12/2022 CLINICAL DATA:  Mental  status change with unknown cause EXAM: CT HEAD WITHOUT CONTRAST TECHNIQUE: Contiguous axial images were obtained from the base of the skull through the vertex without intravenous contrast. RADIATION DOSE REDUCTION: This exam was performed according to the departmental dose-optimization program which includes automated exposure control, adjustment of the mA and/or kV  according to patient size and/or use of iterative reconstruction technique. COMPARISON:  Brain MRI 09/13/2022 FINDINGS: Brain: No evidence of acute infarction, hemorrhage, hydrocephalus, extra-axial collection or mass lesion/mass effect. Essentially occult metastatic disease to the brain. Reference postcontrast brain MRI 09/13/2022 Vascular: No hyperdense vessel or unexpected calcification. Skull: Heterogeneity of the calvarium from known metastatic disease. No acute osseous finding. Extensive cervical fusion which is minimally covered Sinuses/Orbits: No acute finding IMPRESSION: No acute or interval finding. Electronically Signed   By: Jorje Guild M.D.   On: 10/12/2022 05:36   DG CHEST PORT 1 VIEW  Result Date: 10/12/2022 CLINICAL DATA:  Altered mental status.  History of breast cancer EXAM: PORTABLE CHEST 1 VIEW COMPARISON:  PET CT 08/03/2022 FINDINGS: Hazy and streaky right density at the lung bases from scarring based on PET CT 08/03/2022. Trace right pleural effusion. Generous left ventricular contour extension weighted by rotation. Atheromatous calcification of the aorta. Postoperative breasts and left axilla. Scoliosis and spinal degeneration with cervical fusion. IMPRESSION: No acute finding when accounting for scarring at the lung bases with trace right pleural effusion. Electronically Signed   By: Jorje Guild M.D.   On: 10/12/2022 04:40    Assessment: 62 y.o. female   Hallucination and confusion, possible related to medication or hypercalciemia,  rule out infection Metastatic breast cancer to bones, ER and PR negative, HER2 positive, status post spinal surgery twice, just finished palliative radiation to cervical spine. Depression Cancer-related pain.    Plan:  -lab and brain MRI reviewed, unremarkable except calcium is elevated 12.7 today, which can contributed to her confusion. -will give her a doze of zometa 39m  -Her gabapentin has been dose reduced, she is tolerating Norco  well, hallucination and confusion has resolved so far. -Urine and blood culture are pending -Patient is scheduled to start chemotherapy next week.  She will get a short course of radiation to low back before chemo starts, per Dr. SIsidore Moos -I will f/u as needed. Please call [place] anything we can do for her.   YTruitt Merle MD 10/12/2022  6:05 PM

## 2022-10-12 NOTE — TOC Progression Note (Signed)
Transition of Care [facility]) - Progression Note    Patient Details  Name: Martha [name] MRN: [id number] Date of Birth: 25-May-1961  Transition of Care [facility]) CM/SW [place], RN Phone Number:[phone]  10/12/2022, 4:21 PM  Clinical Narrative:     Transition of Care [facility]) Screening Note   Patient Details  Name: Martha [name] Date of Birth: [birth year]   Transition [facility]) CM/SW Contact:    Angelita Ingles, RN Phone Number: 10/12/2022, 4:21 PM    Transition of Care Department [facility]) has reviewed patient and no TOC needs have been identified at this time. We will continue to monitor patient advancement through interdisciplinary progression rounds. If new patient transition needs arise, please place a TOC consult.          Expected Discharge Plan and Services                                               Social Determinants of Health (SDOH) Interventions SDOH Screenings   Food Insecurity: No Food Insecurity (09/04/2022)  Housing: Low Risk  (09/04/2022)  Transportation Needs: No Transportation Needs (09/04/2022)  Utilities: Not At Risk (09/04/2022)  Depression (PHQ2-9): Low Risk  (05/04/2022)  Tobacco Use: High Risk (10/12/2022)    Readmission Risk Interventions    09/07/2022    4:20 PM  Readmission Risk Prevention Plan  Transportation Screening Complete  PCP or Specialist Appt within 5-7 Days Complete  Home Care Screening Complete  Medication Review (RN CM) Complete

## 2022-10-12 NOTE — H&P (Signed)
History and Physical  Yadhira Heldreth [id number] DOB: [birth year] DOA: 10/12/2022   PCP: [name], MD   Patient coming from: Home   Chief Complaint: Weakness, confusion   HPI: Kewana Miars is a 62 y.o. female with medical history significant for estrogen receptor positive breast cancer diagnosed 12/2017 unfortunately with metastasis to cervical spine and brain discovered in July 2023 who is being admitted to the hospital for evaluation and management of confusion and weakness.  The patient was recently discharged from [facility] inpatient rehab on 1/31.  She is currently undergoing radiation to her brain.  She has some chronic dizziness, and sensation of falling out of the bed, husband states that this has been going on for several weeks especially after her more recent cervical spine surgery in January 2024.  This problem seems to come and go, has been worse in the last 2 to 3 days.  Patient's husband also states that she has been somewhat confused and having some hallucinations, for example seeing people in her room and even this morning here in the hospital who are not present.  She has never really had this problem before, started in the last 3 to 4 days.  Per the patient and her husband, it seems that this confusion is somewhat transient, does not last very long.  While she was in rehab, her Celexa dose was increased to 40 mg, her gabapentin was titrated up to 200 mg p.o. 3 times daily.  There have been no other significant medication changes, and there has not been any medication changes since discharge from rehab.  Overall patient and her husband feel that she has been doing well considering what she has been through, she continues to ambulate with a walker, she is supposed to start outpatient therapy soon.  ED Course: Husband called EMS and the patient was brought to the emergency department for evaluation, where she has unremarkable vital signs and is stable on room air.   So far lab work reveals normal white blood cell count, normal hemoglobin, platelets 128.  Sodium 132, potassium 3.4.  Renal function is normal.  Calcium 12.7.  Lactate is normal at 1.4.  Review of Systems: Please see HPI for pertinent positives and negatives. A complete 10 system review of systems are otherwise negative.  Past Medical History:  Diagnosis Date   Anxiety    Cancer ([place]) 2022   right breast LCIS   Cancer ([place]) 2019   left breast   Depression    Dysrhythmia    SVT, s/p ablation ~ 2012 in at [facility]   Ectopic pregnancy    Family history of breast cancer    Family history of melanoma    History of radiation therapy 04/22/18-06/03/18   Left Breast, left SCV, axilla 50 Gy in 25 fractions, Left breast boost 10 Gy in 5 fractions.    Hypothyroidism    Personal history of radiation therapy    PONV (postoperative nausea and vomiting)    Thyroid disease    Past Surgical History:  Procedure Laterality Date   APPENDECTOMY     BILATERAL SALPINGECTOMY     BREAST BIOPSY Right 2014   fibroadenoma   BREAST BIOPSY Left 01/16/2018   BREAST BIOPSY Right 02/02/2020   LCIS   BREAST BIOPSY Right 08/04/2020   x2 LCIS   BREAST BIOPSY Right 08/13/2020   x2   BREAST EXCISIONAL BIOPSY Left 1990   benign   BREAST EXCISIONAL BIOPSY Right 02/02/2020  LCIS   BREAST LUMPECTOMY Left 01/22/2018   BREAST LUMPECTOMY WITH AXILLARY LYMPH NODE BIOPSY Left 02/21/2018   Procedure: LEFT BREAST LUMPECTOMY WITH AXILLARY LYMPH NODE BIOPSY;  Surgeon: [name], MD;  Location: [place];  Service: General;  Laterality: Left;   BREAST LUMPECTOMY WITH RADIOACTIVE SEED LOCALIZATION Right 03/18/2020   Procedure: RIGHT BREAST LUMPECTOMY WITH RADIOACTIVE SEED LOCALIZATION;  Surgeon: [name], MD;  Location: [place];  Service: General;  Laterality: Right;   BREAST SURGERY Left 1993   cyst removed   ENDOMETRIAL ABLATION     EYE SURGERY     GLAUCOMA SURGERY Bilateral    POSTERIOR CERVICAL  FUSION/FORAMINOTOMY N/A 08/17/2022   Procedure: Cervical One-Cervical Four POSTERIOR CERVICAL FUSION,  Cervical One LAMINECTOMY REDUCTION OF Cervical Two Fracture, Biopsy of Right Cerical Two Pars  Lesion;  Surgeon: Vallarie Mare, MD;  Location: [place];  Service: Neurosurgery;  Laterality: N/A;   POSTERIOR CERVICAL FUSION/FORAMINOTOMY N/A 09/06/2022   Procedure: Posterior Cervical Fusion, Foraminotomy , Cervical Five-Six, Cervical Six-Seven; Extension of  fusion Cervical Four- Thoracic One;  Surgeon: Vallarie Mare, MD;  Location: [place];  Service: Neurosurgery;  Laterality: N/A;   RADIOACTIVE SEED GUIDED EXCISIONAL BREAST BIOPSY Right 04/28/2021   Procedure: RADIOACTIVE SEED GUIDED EXCISIONAL RIGHT BREAST BIOPSY X2;  Surgeon: [name], MD;  Location: [place];  Service: General;  Laterality: Right;   RE-EXCISION OF BREAST LUMPECTOMY Left 03/20/2018   Procedure: RE-EXCISION OF BREAST LUMPECTOMY;  Surgeon: [name], MD;  Location: [place];  Service: General;  Laterality: Left;   Social History:  reports that she has been smoking cigarettes. She has a 20.00 pack-year smoking history. She has never used smokeless tobacco. She reports that she does not currently use alcohol after a past usage of about 28.0 standard drinks of alcohol per week. She reports that she does not use drugs.  Allergies  Allergen Reactions   Morphine And Related Other (See Comments)    "Makes her angry"   Family History  Problem Relation Age of Onset   Heart disease Mother    Pneumonia Father    Hyperlipidemia Sister    Melanoma Sister 9       hx of 5 melanomas   Lung cancer Maternal Uncle        mesothelioma   Pneumonia Maternal Uncle    Dementia Paternal Grandmother    Lung disease Paternal Grandfather    Breast cancer Other        MGMs sister dx < 1   Breast cancer Other        PGFs sister dx < 67   Breast cancer Cousin        mother's mat first cousin, dx <  28     Prior to Admission medications   Medication Sig Start Date End Date Taking? Authorizing Provider  apixaban (ELIQUIS) 2.5 MG TABS tablet Take 1 tablet (2.5 mg total) by mouth 2 (two) times daily. 09/26/22   Love, Ivan Anchors, PA-C  ascorbic acid (VITAMIN C) 1000 MG tablet Take 1 tablet (1,000 mg total) by mouth daily. 09/26/22   Love, Ivan Anchors, PA-C  citalopram (CELEXA) 40 MG tablet Take 1 tablet (40 mg total) by mouth daily. 09/26/22   Love, Ivan Anchors, PA-C  cyclobenzaprine (FLEXERIL) 10 MG tablet Take 1 tablet (10 mg total) by mouth 3 (three) times daily as needed for muscle spasms. 09/26/22   Love, Ivan Anchors, PA-C  gabapentin (NEURONTIN) 100 MG capsule Take  2 capsules (200 mg total) by mouth 3 (three) times daily. 10/02/22   Truitt Merle, MD  HYDROcodone-acetaminophen (NORCO/VICODIN) 5-325 MG tablet Take 1 tablet by mouth every 4 (four) hours as needed for severe pain. 09/26/22   Love, Ivan Anchors, PA-C  levothyroxine (SYNTHROID) 75 MCG tablet Take 1 tablet (75 mcg total) by mouth daily before breakfast. 09/26/22   Love, Ivan Anchors, PA-C  lidocaine (XYLOCAINE) 2 % solution Patient: Mix 1part 2% viscous lidocaine, 1part H20. Swallow 21m of diluted mixture, 329m before meals and at bedtime, up to QID 10/02/22   SqEppie GibsonMD  lidocaine-prilocaine (EMLA) cream Apply to affected area once 10/02/22   FeTruitt MerleMD  Mouthwashes (MOUTH RINSE) LIQD solution 15 mLs by Mouth Rinse route as needed (for oral care). 09/26/22   Love, PaIvan AnchorsPA-C  ondansetron (ZOFRAN) 8 MG tablet Take 1 tablet (8 mg total) by mouth every 8 (eight) hours as needed for nausea or vomiting. 10/02/22   FeTruitt MerleMD  OVER THE COUNTER MEDICATION Take 1 tablet by mouth daily. Protandim Supplement    [provider]  Probiotic Product (PROBIOTIC PO) Take 1 capsule by mouth daily.    [provider]  prochlorperazine (COMPAZINE) 10 MG tablet Take 1 tablet (10 mg total) by mouth every 6 (six) hours as needed for nausea or vomiting.  10/02/22   FeTruitt MerleMD  senna-docusate (SENOKOT-S) 8.6-50 MG tablet Take 1 tablet by mouth 2 (two) times daily. 09/26/22   Love, PaIvan AnchorsPA-C  TYLENOL 500 MG tablet Take 500 mg by mouth every 6 (six) hours as needed for mild pain or headache.    [provider]    Physical Exam: BP 122/72   Pulse (!) 102   Temp 97.7 F (36.5 C) (Oral)   Resp (!) 29   Ht 5' 2"$  (1.575 m)   Wt 40.3 kg   SpO2 94%   BMI 16.25 kg/m   General: Thin frail appearing woman who looks older than her stated age.  She is awake alert oriented, calm and cooperative.  She is a good historian.  Her husband is at the bedside with her in the ER this morning. Eyes: EOMI, clear conjuctivae, white sclerea Neck: supple, no masses, trachea mildline  Cardiovascular: RRR, no murmurs or rubs, no peripheral edema  Respiratory: clear to auscultation bilaterally, no wheezes, no crackles  Abdomen: soft, nontender, nondistended, normal bowel tones heard  Skin: dry, no rashes  Musculoskeletal: no joint effusions, normal range of motion  Psychiatric: appropriate affect, normal speech  Neurologic: extraocular muscles intact, clear speech, moving all extremities with intact sensorium          Labs on Admission:  Basic Metabolic Panel: Recent Labs  Lab 10/12/22 0445  NA 132*  K 3.4*  CL 93*  CO2 25  GLUCOSE 104*  BUN 23  CREATININE 0.50  CALCIUM 12.7*   Liver Function Tests: Recent Labs  Lab 10/12/22 0445  AST 102*  ALT 29  ALKPHOS 723*  BILITOT 1.2  PROT 6.7  ALBUMIN 3.8   No results for input(s): "LIPASE", "AMYLASE" in the last 168 hours. Recent Labs  Lab 10/12/22 0652  AMMONIA 23   CBC: Recent Labs  Lab 10/12/22 0445  WBC 8.4  NEUTROABS 7.1  HGB 12.6  HCT 36.9  MCV 92.7  PLT 128*   Cardiac Enzymes: No results for input(s): "CKTOTAL", "CKMB", "CKMBINDEX", "TROPONINI" in the last 168 hours.  BNP (last 3 results) No results for input(s): "  BNP" in the last 8760 hours.  ProBNP (last 3  results) No results for input(s): "PROBNP" in the last 8760 hours.  CBG: No results for input(s): "GLUCAP" in the last 168 hours.  Radiological Exams on Admission: CT Head Wo Contrast  Result Date: 10/12/2022 CLINICAL DATA:  Mental status change with unknown cause EXAM: CT HEAD WITHOUT CONTRAST TECHNIQUE: Contiguous axial images were obtained from the base of the skull through the vertex without intravenous contrast. RADIATION DOSE REDUCTION: This exam was performed according to the departmental dose-optimization program which includes automated exposure control, adjustment of the mA and/or kV according to patient size and/or use of iterative reconstruction technique. COMPARISON:  Brain MRI 09/13/2022 FINDINGS: Brain: No evidence of acute infarction, hemorrhage, hydrocephalus, extra-axial collection or mass lesion/mass effect. Essentially occult metastatic disease to the brain. Reference postcontrast brain MRI 09/13/2022 Vascular: No hyperdense vessel or unexpected calcification. Skull: Heterogeneity of the calvarium from known metastatic disease. No acute osseous finding. Extensive cervical fusion which is minimally covered Sinuses/Orbits: No acute finding IMPRESSION: No acute or interval finding. Electronically Signed   By: Jorje Guild M.D.   On: 10/12/2022 05:36   DG CHEST PORT 1 VIEW  Result Date: 10/12/2022 CLINICAL DATA:  Altered mental status.  History of breast cancer EXAM: PORTABLE CHEST 1 VIEW COMPARISON:  PET CT 08/03/2022 FINDINGS: Hazy and streaky right density at the lung bases from scarring based on PET CT 08/03/2022. Trace right pleural effusion. Generous left ventricular contour extension weighted by rotation. Atheromatous calcification of the aorta. Postoperative breasts and left axilla. Scoliosis and spinal degeneration with cervical fusion. IMPRESSION: No acute finding when accounting for scarring at the lung bases with trace right pleural effusion. Electronically Signed   By:  Jorje Guild M.D.   On: 10/12/2022 04:40    Assessment/Plan Principal Problem:   Hallucination -etiology of the patient's presentation is unclear at this time, differential is broad and includes worsening cranial metastatic disease, acute infection, or other electrolyte derangements.  May also be related to medication, she is not on a very high dose of gabapentin but she is small. -Will admit the patient to MedSurg floor under observation status -MRI brain -pending -PT/OT evaluation, she may benefit from home health -RD consultation -Check UA/culture, ammonia, TSH -these are pending -Will reduce gabapentin dose to 100 p.o. 3 times daily Active Problems:   Malignant neoplasm of lower-inner quadrant of left breast in female, estrogen receptor positive ([place])   Cervicalgia   Cancer, metastatic to bone ([name])   AMS (altered mental status)   Depression-continue Celexa   Cancer associated pain -continue home pain control regimen  DVT prophylaxis: Continue home Eliquis  Code Status: DNR, confirmed with the patient and her husband at the bedside at the time of admission.  Family Communication: Plan of care discussed with the patient and her husband at the bedside.  Disposition Plan: Anticipate the patient will discharge home, possibly with home health, when medically stable.  Consults called: No formal consultations requested, patient's oncologist Dr. Burr Medico and radiation oncology Dr. Isidore Moos have been notified of her admission.  Admission status: Observation  Time spent: 36 minutes  Enzo Treu Neva Seat MD [facility] Pager [phone]  If 7PM-7AM, please contact night-coverage [email or web address] Password [facility]  10/12/2022, 8:13 AM

## 2022-10-13 ENCOUNTER — Other Ambulatory Visit: Payer: Self-pay

## 2022-10-13 ENCOUNTER — Ambulatory Visit
Admission: RE | Admit: 2022-10-13 | Discharge: 2022-10-13 | Disposition: A | Discharge status: Home / Self Care | Payer: BC Managed Care – PPO | Source: Ambulatory Visit | Attending: Radiation Oncology | Admitting: Radiation Oncology

## 2022-10-13 DIAGNOSIS — E039 Hypothyroidism, unspecified: Secondary | ICD-10-CM | POA: Diagnosis present

## 2022-10-13 DIAGNOSIS — Z1152 Encounter for screening for COVID-19: Secondary | ICD-10-CM | POA: Diagnosis not present

## 2022-10-13 DIAGNOSIS — C7951 Secondary malignant neoplasm of bone: Secondary | ICD-10-CM | POA: Diagnosis not present

## 2022-10-13 DIAGNOSIS — C50312 Malignant neoplasm of lower-inner quadrant of left female breast: Secondary | ICD-10-CM | POA: Diagnosis present

## 2022-10-13 DIAGNOSIS — Z66 Do not resuscitate: Secondary | ICD-10-CM | POA: Diagnosis not present

## 2022-10-13 DIAGNOSIS — R443 Hallucinations, unspecified: Secondary | ICD-10-CM | POA: Diagnosis not present

## 2022-10-13 DIAGNOSIS — Z681 Body mass index (BMI) 19 or less, adult: Secondary | ICD-10-CM | POA: Diagnosis not present

## 2022-10-13 DIAGNOSIS — Z170 Estrogen receptor positive status [ER+]: Secondary | ICD-10-CM

## 2022-10-13 DIAGNOSIS — M8448XA Pathological fracture, other site, initial encounter for fracture: Secondary | ICD-10-CM | POA: Diagnosis present

## 2022-10-13 DIAGNOSIS — C799 Secondary malignant neoplasm of unspecified site: Secondary | ICD-10-CM | POA: Diagnosis not present

## 2022-10-13 DIAGNOSIS — G893 Neoplasm related pain (acute) (chronic): Secondary | ICD-10-CM | POA: Diagnosis present

## 2022-10-13 DIAGNOSIS — F32A Depression, unspecified: Secondary | ICD-10-CM | POA: Diagnosis present

## 2022-10-13 DIAGNOSIS — R4701 Aphasia: Secondary | ICD-10-CM | POA: Diagnosis present

## 2022-10-13 DIAGNOSIS — F17210 Nicotine dependence, cigarettes, uncomplicated: Secondary | ICD-10-CM | POA: Diagnosis present

## 2022-10-13 DIAGNOSIS — Z515 Encounter for palliative care: Secondary | ICD-10-CM | POA: Diagnosis not present

## 2022-10-13 DIAGNOSIS — R1310 Dysphagia, unspecified: Secondary | ICD-10-CM | POA: Diagnosis present

## 2022-10-13 DIAGNOSIS — Z23 Encounter for immunization: Secondary | ICD-10-CM | POA: Diagnosis not present

## 2022-10-13 DIAGNOSIS — C7931 Secondary malignant neoplasm of brain: Secondary | ICD-10-CM | POA: Diagnosis present

## 2022-10-13 DIAGNOSIS — M542 Cervicalgia: Secondary | ICD-10-CM | POA: Diagnosis not present

## 2022-10-13 DIAGNOSIS — R4182 Altered mental status, unspecified: Secondary | ICD-10-CM | POA: Diagnosis present

## 2022-10-13 DIAGNOSIS — R451 Restlessness and agitation: Secondary | ICD-10-CM | POA: Diagnosis not present

## 2022-10-13 DIAGNOSIS — F064 Anxiety disorder due to known physiological condition: Secondary | ICD-10-CM | POA: Diagnosis present

## 2022-10-13 DIAGNOSIS — R64 Cachexia: Secondary | ICD-10-CM | POA: Diagnosis present

## 2022-10-13 DIAGNOSIS — E876 Hypokalemia: Secondary | ICD-10-CM | POA: Diagnosis present

## 2022-10-13 DIAGNOSIS — E43 Unspecified severe protein-calorie malnutrition: Secondary | ICD-10-CM | POA: Diagnosis present

## 2022-10-13 DIAGNOSIS — Z0189 Encounter for other specified special examinations: Secondary | ICD-10-CM | POA: Diagnosis not present

## 2022-10-13 LAB — CBC
HCT: 36.6 % (ref 36.0–46.0)
Hemoglobin: 12.3 g/dL (ref 12.0–15.0)
MCH: 31.4 pg (ref 26.0–34.0)
MCHC: 33.6 g/dL (ref 30.0–36.0)
MCV: 93.4 fL (ref 80.0–100.0)
Platelets: 135 10*3/uL — ABNORMAL LOW (ref 150–400)
RBC: 3.92 MIL/uL (ref 3.87–5.11)
RDW: 14.2 % (ref 11.5–15.5)
WBC: 7.4 10*3/uL (ref 4.0–10.5)
nRBC: 0 % (ref 0.0–0.2)

## 2022-10-13 LAB — RAD ONC ARIA SESSION SUMMARY
Course Elapsed Days: 23
Plan Fractions Treated to Date: 1
Plan Prescribed Dose Per Fraction: 8 Gy
Plan Total Fractions Prescribed: 1
Plan Total Prescribed Dose: 8 Gy
Reference Point Dosage Given to Date: 8 Gy
Reference Point Session Dosage Given: 8 Gy
Session Number: 12

## 2022-10-13 LAB — URINE CULTURE: Culture: NO GROWTH

## 2022-10-13 LAB — DIFFERENTIAL
Abs Immature Granulocytes: 0.44 10*3/uL — ABNORMAL HIGH (ref 0.00–0.07)
Basophils Absolute: 0 10*3/uL (ref 0.0–0.1)
Basophils Relative: 1 %
Eosinophils Absolute: 0.4 10*3/uL (ref 0.0–0.5)
Eosinophils Relative: 6 %
Immature Granulocytes: 6 %
Lymphocytes Relative: 3 %
Lymphs Abs: 0.2 10*3/uL — ABNORMAL LOW (ref 0.7–4.0)
Monocytes Absolute: 0.5 10*3/uL (ref 0.1–1.0)
Monocytes Relative: 6 %
Neutro Abs: 5.8 10*3/uL (ref 1.7–7.7)
Neutrophils Relative %: 78 %

## 2022-10-13 LAB — BASIC METABOLIC PANEL
Anion gap: 13 (ref 5–15)
BUN: 18 mg/dL (ref 8–23)
CO2: 25 mmol/L (ref 22–32)
Calcium: 12.2 mg/dL — ABNORMAL HIGH (ref 8.9–10.3)
Chloride: 97 mmol/L — ABNORMAL LOW (ref 98–111)
Creatinine, Ser: 0.62 mg/dL (ref 0.44–1.00)
GFR, Estimated: >60 mL/min (ref >60)
Glucose, Bld: 116 mg/dL — ABNORMAL HIGH (ref 70–99)
Potassium: 2.7 mmol/L — CL (ref 3.5–5.1)
Sodium: 135 mmol/L (ref 135–145)

## 2022-10-13 LAB — PHOSPHORUS: Phosphorus: 2.6 mg/dL (ref 2.5–4.6)

## 2022-10-13 LAB — MAGNESIUM: Magnesium: 2.3 mg/dL (ref 1.7–2.4)

## 2022-10-13 MED ORDER — LORAZEPAM 2 MG/ML IJ SOLN
0.5000 mg | Freq: Four times a day (QID) | INTRAMUSCULAR | Status: DC | PRN
  Administered 2022-10-13 – 2022-10-15 (×2): 1 mg via INTRAVENOUS
  Filled 2022-10-13 (×3): qty 1

## 2022-10-13 MED ORDER — SODIUM PHOSPHATES 45 MMOLE/15ML IV SOLN: 30.0000 mmol | Freq: Once | INTRAVENOUS | Status: DC

## 2022-10-13 MED ORDER — DEXTROSE-NACL 5-0.9 % IV SOLN: INTRAVENOUS | Status: DC

## 2022-10-13 MED ORDER — POTASSIUM CHLORIDE CRYS ER 20 MEQ PO TBCR
40.0000 meq | EXTENDED_RELEASE_TABLET | Freq: Once | ORAL | Status: AC
  Administered 2022-10-13: 40 meq via ORAL
  Filled 2022-10-13: qty 2

## 2022-10-13 NOTE — Progress Notes (Signed)
Bedside nurse had concerns about mental status of the patient, bedside nurse noticed R pupil a 5 and L pupil was a four both round and reactive. Pt alert and oriented X2. Provider notified about pupillary difference. Rapid response also notified. No orders at this time.

## 2022-10-13 NOTE — Progress Notes (Signed)
Patient's husband would like to speak to palliative care tomorrow.

## 2022-10-13 NOTE — Evaluation (Signed)
Occupational Therapy Evaluation Patient Details Name: Martha [name] MRN: [id number] DOB: 03-02-1961 Today's Date: 10/13/2022   History of Present Illness Martha [name] is a 62 y.o. female admitted with hallucination. 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   Clinical Impression   Martha [name] is a 62 year old woman who presents with impaired balance, generalized weakness, decreased activity tolerance and pain. After discharging from [facility] she was able to ambulate in home with walker and perform ADLs but then declined due to pain. Now she is requiring +2 for transfers ,exhibiting a posterior bias, has equilibrium issues even in supine and pain in back and right flank. Patient will benefit from skilled OT services while in hospital to improve deficits and learn compensatory strategies as needed in order to reduce caregiver burden. Current recommendation is for [facility] or Outpatient clinic as pain is the most prominent issue. Suspect if pain improves she will tolerate more mobility.        Recommendations for follow up therapy are one component of a multi-disciplinary discharge planning process, led by the attending physician.  Recommendations may be updated based on patient status, additional functional criteria and insurance authorization.   Follow Up Recommendations  Other (comment) (HH, vs outpatient clinic, vs higher level pending pain management)     Assistance Recommended at Discharge Frequent or constant Supervision/Assistance  Patient can return home with the following A lot of help with bathing/dressing/bathroom;Assistance with cooking/housework;Direct supervision/assist for medications management;Assist for transportation;Two people to help with walking and/or transfers;Help with stairs or ramp for entrance;Direct supervision/assist  for financial management    Functional Status Assessment  Patient has had a recent decline in their functional status and demonstrates the ability to make significant improvements in function in a reasonable and predictable amount of time.  Equipment Recommendations  None recommended by OT    Recommendations for Other Services       Precautions / Restrictions Precautions Precautions: Cervical;Fall Precaution Booklet Issued: No Precaution Comments: verbally reviewed and pt able to recall with minimal cueing Required Braces or Orthoses: Cervical Brace Cervical Brace: Hard collar Restrictions Weight Bearing Restrictions: No Other Position/Activity Restrictions: reprots she prefers to wear the collar for comfort -- she states she can take it off in bed. Will need to verify.      Mobility Bed Mobility Overal bed mobility: Needs Assistance Bed Mobility: Rolling, Sidelying to Sit, Sit to Sidelying Rolling: Min assist Sidelying to sit: Mod assist     Sit to sidelying: Mod assist General bed mobility comments: min A to come to sidelying, mod A to upright trunk into sitting. Patient exhibited difficulty due UE shoulder ROM and weakness to assist with pulling up. Exhibits a posterior bias as well throghout movement    Transfers Overall transfer level: Needs assistance Equipment used:  (used the back of a straight back chair) Transfers: Sit to/from Stand Sit to Stand: Mod assist, +2 physical assistance           General transfer comment: Total of physical mod assist - but required assist on both sides, blocking her feet and and getting her more anterior to reduce posterior lean. She held onto back of straight back chair - therapist using that to initially get her to come more forward at edge of bed. Inevitably pain became an issue and had to return to supine.      Balance Overall  balance assessment: Needs assistance Sitting-balance support: Feet supported, Bilateral upper  extremity supported Sitting balance-Leahy Scale: Fair Sitting balance - Comments: min A to maintain static, supported sitting due to posterior lean   Standing balance support: During functional activity, Bilateral upper extremity supported Standing balance-Leahy Scale: Poor Standing balance comment: mod A from therapist due to posterior lean                           ADL either performed or assessed with clinical judgement   ADL Overall ADL's : Needs assistance/impaired Eating/Feeding: Set up;Bed level   Grooming: Moderate assistance;Bed level Grooming Details (indicate cue type and reason): needs assistance for grooming above mouth Upper Body Bathing: Bed level;Minimal assistance   Lower Body Bathing: Maximal assistance;Bed level   Upper Body Dressing : Moderate assistance;Bed level   Lower Body Dressing: Maximal assistance;Bed level   Toilet Transfer: Moderate assistance;+2 for physical assistance;BSC/3in1   Toileting- Clothing Manipulation and Hygiene: Maximal assistance;Sit to/from stand       Functional mobility during ADLs: +2 for safety/equipment;Rolling walker (2 wheels);Moderate assistance       Vision   Vision Assessment?: No apparent visual deficits     Perception     Praxis      Pertinent Vitals/Pain Pain Assessment Pain Assessment: Faces Faces Pain Scale: Hurts whole lot Pain Location: back and right flank pain Pain Descriptors / Indicators: Grimacing, Guarding, Sharp Pain Intervention(s): Limited activity within patient's tolerance, Repositioned     Hand Dominance     Extremity/Trunk Assessment Upper Extremity Assessment Upper Extremity Assessment: RUE deficits/detail;LUE deficits/detail RUE Deficits / Details: Decreased shoulder ROM, grossly 4-/5 strength in elbow but wrist and grip 3+/5 LUE Deficits / Details: Decreased shoulder ROM, grossly 4-/5 strength in elbow but wrist and grip 3+/5   Lower Extremity Assessment Lower  Extremity Assessment: Defer to PT evaluation   Cervical / Trunk Assessment Cervical / Trunk Assessment: Normal   Communication     Cognition Arousal/Alertness: Awake/alert Behavior During Therapy: WFL for tasks assessed/performed Overall Cognitive Status: Within Functional Limits for tasks assessed                                 General Comments: Alert to self, situation, knows the exact date but off on day of the week. She is aware she is having hallucinations and asked "how many people are in the room" and reported seeing a guy who used to pain for her.     General Comments       Exercises     Shoulder Instructions      Home Living                                          Prior Functioning/Environment                          OT Problem List: Decreased strength;Decreased range of motion;Decreased activity tolerance;Impaired balance (sitting and/or standing);Decreased cognition;Decreased knowledge of use of DME or AE;Decreased knowledge of precautions;Pain      OT Treatment/Interventions: Self-care/ADL training;Therapeutic exercise;DME and/or AE instruction;Cognitive remediation/compensation;Neuromuscular education;Balance training;Patient/family education    OT Goals(Current goals can be found in the care plan section) Acute Rehab OT Goals Patient Stated Goal: less pain and better balance  OT Goal Formulation: With patient/family Time For Goal Achievement: 10/27/22 Potential to Achieve Goals: Good  OT Frequency: Min 2X/week    Co-evaluation              AM-PAC OT "6 Clicks" Daily Activity     Outcome Measure Help from another person eating meals?: A Little Help from another person taking care of personal grooming?: A Lot Help from another person toileting, which includes using toliet, bedpan, or urinal?: A Lot Help from another person bathing (including washing, rinsing, drying)?: A Lot Help from another person to put  on and taking off regular upper body clothing?: A Lot Help from another person to put on and taking off regular lower body clothing?: A Lot 6 Click Score: 13   End of Session Equipment Utilized During Treatment: Gait belt Nurse Communication: Mobility status  Activity Tolerance: Patient limited by pain Patient left: in bed;with call bell/phone within reach;with bed alarm set  OT Visit Diagnosis: Unsteadiness on feet (R26.81);Other abnormalities of gait and mobility (R26.89);Other symptoms and signs involving cognitive function                Time: [id number] OT Time Calculation (min): 34 min Charges:  OT General Charges $OT Visit: 1 Visit OT Evaluation $OT Eval Moderate Complexity: 1 Mod OT Treatments $Therapeutic Activity: 8-22 mins  Gustavo Lah, OTR/L [place]  Office [phone]   Lenward Chancellor 10/13/2022, 10:53 AM

## 2022-10-13 NOTE — Radiation Completion Notes (Signed)
Patient Name: Martha [name], Martha [name] MRN: [id number] Date of Birth: [birth year] Referring Physician: Dimas Chyle, M.D. Date of Service: 2022-10-13 Radiation Oncologist: Eppie Gibson, M.D. [place] [facility] END OF TREATMENT NOTE     Diagnosis: C79.51 Secondary malignant neoplasm of bone; C79.31 Secondary malignant neoplasm of brain Staging on 2018-04-09: Malignant neoplasm of lower-inner quadrant of left breast in female, estrogen receptor positive ([place]) T=pT1c, N=pN1, M=cM0 Staging on 2018-01-16: Malignant neoplasm of lower-inner quadrant of left breast in female, estrogen receptor positive ([name]) T=cT1c, N=cN0, M=cM0 Intent: Palliative     ==========DELIVERED PLANS==========  First Treatment Date: 2022-09-20 - Last Treatment Date: 2022-10-13   Plan Name: Spine_C_SUP Site: Cervical Spine Technique: 3D Mode: Photon Dose Per Fraction: 3 Gy Prescribed Dose (Delivered / Prescribed): 30 Gy / 30 Gy Prescribed Fxs (Delivered / Prescribed): 10 / 10   Plan Name: Brain_SRS Site: Brain Technique: SBRT/SRT-IMRT Mode: Photon Dose Per Fraction: 20 Gy Prescribed Dose (Delivered / Prescribed): 20 Gy / 20 Gy Prescribed Fxs (Delivered / Prescribed): 1 / 1   Plan Name: Spine_C_INF Site: Cervical Spine Technique: 3D Mode: Photon Dose Per Fraction: 3 Gy Prescribed Dose (Delivered / Prescribed): 30 Gy / 30 Gy Prescribed Fxs (Delivered / Prescribed): 10 / 10   Plan Name: Spine_L5-S3 Site: Lumbar Spine Technique: 3D Mode: Photon Dose Per Fraction: 8 Gy Prescribed Dose (Delivered / Prescribed): 8 Gy / 8 Gy Prescribed Fxs (Delivered / Prescribed): 1 / 1     ==========ON TREATMENT VISIT DATES========== 2022-09-20, 2022-10-02, 2022-10-09     ==========UPCOMING VISITS==========       ==========APPENDIX - ON TREATMENT VISIT NOTES==========   See weekly On Treatment Notes is Epic for details.

## 2022-10-13 NOTE — Progress Notes (Signed)
PROGRESS NOTE    Martha [name]  [id number] DOB: 11/13/1960 DOA: 10/12/2022 PCP: [name], MD     Brief Narrative:   62 y.o. WF PMHx anxiety, depression, dysrhythmia, ER positive breast cancer diagnosed 12/2017 with metastasis to cervical spine and brain discovered in July 2023   Admitted to the hospital for evaluation and management of confusion and weakness.  The patient was recently discharged from [facility] health inpatient rehab on 1/31.  She is currently undergoing radiation to her brain.  She has some chronic dizziness, and sensation of falling out of the bed, husband states that this has been going on for several weeks especially after her more recent cervical spine surgery in January 2024.  This problem seems to come and go, has been worse in the last 2 to 3 days.  Patient's husband also states that she has been somewhat confused and having some hallucinations, for example seeing people in her room and even this morning here in the hospital who are not present.  She has never really had this problem before, started in the last 3 to 4 days.  Per the patient and her husband, it seems that this confusion is somewhat transient, does not last very long.  While she was in rehab, her Celexa dose was increased to 40 mg, her gabapentin was titrated up to 200 mg p.o. 3 times daily.  There have been no other significant medication changes, and there has not been any medication changes since discharge from rehab.  Overall patient and her husband feel that she has been doing well considering what she has been through, she continues to ambulate with a walker, she is supposed to start outpatient therapy soon.   ED Course: Husband called EMS and the patient was brought to the emergency department for evaluation, where she has unremarkable vital signs and is stable on room air.  So far lab work reveals normal white blood cell count, normal hemoglobin, platelets 128.  Sodium 132, potassium 3.4.   Renal function is normal.  Calcium 12.7.  Lactate is normal at 1.4.   Subjective: 2/16 A/O x 4, very anxious feels as if she is going to fall even though she is lying in the bed.  Having waxing and waning hallucinations (sees her nephew)   Assessment & Plan: Covid vaccination;   Principal Problem:   Hallucination Active Problems:   Malignant neoplasm of lower-inner quadrant of left breast in female, estrogen receptor positive ([place])   Cervicalgia   Cancer, metastatic to bone ([place])   AMS (altered mental status)   Depression   Cancer associated pain  Malignant neoplasm of lower-inner quadrant of LEFT breast, ER positive  -Patient sees Dr. Burr Medico oncologist and Dr. Eppie Gibson radiation oncologist -Dx 12/2017 - S/p LEFT breast lumpectomy and adjuvant XRT - Letrozole started 05/2018 - lost f/u after visit in 04/2020 until her recurrence in 07/2022     Cancer, metastatic to bone  -07/24/2022 MRI C-spine C-spine mets see below extensive osseous metastatic disease with pathologic fracture at the base of dens and C2 right lateral mass. Extraosseous tumor at C1 and C2 likely impinging on the right C2 and C3 nerve roots.  -08/03/2022 PET scan diffuse bone mets -08/17/2022 cervical laminectomy and fusion by Dr. Marcello Moores biopsy confirmed metastatic breast cancer, ER/PR negative, HER2 positive. -08/17/2022 due to cervical cord compression with myelopathy, she underwent a second cervical spine surgery on September 06, 2022 -S/p brain XRT -S/p chemotherapy -2/16 per Dr.Feng oncology note from  2/15 treatment plan is as follows  -lab and brain MRI reviewed, unremarkable except calcium is elevated 12.7 today, which can contributed to her confusion. -will give her a doze of zometa 16m  -Her gabapentin has been dose reduced, she is tolerating Norco well,  -Urine and blood culture are pending -Patient is scheduled to start chemotherapy next week.   -2/16 Discussed case with Dr. SEppie Gibsonradiation  oncologist revised XRT plan.  Now will only receive 1 dose of L-spine XRT today.  Altered mental status - Most likely multifactorial, brain mets, pain management medication, probably severe protein calorie malnutrition, possible infection. - 2/15 blood NGTD -2/15 urine negative -2/16 patient continues to have waxing and waning hallucinations (sees her nephew) -2/16 ammonia, TSH WNL -2/16 will consult PT/OT in a.m. -2/16 decrease Gabapentin 100 mg TID  Depression - Celexa  Hypokalemia - Potassium goal> 4 - 2/16 K-Phos 30 mmol - 2/16 K-Dur 40 mEq   Severe protein calorie malnutrition -2/16 D5-0.9% saline 752mhr - 2/16 nutrition consult      Mobility Assessment (last 72 hours)     Mobility Assessment     Row Name 10/12/22 2125 10/12/22 1417 10/12/22 1100       Does patient have an order for bedrest or is patient medically unstable No - Continue assessment -- No - Continue assessment     What is the highest level of mobility based on the progressive mobility assessment? Level 2 (Chairfast) - Balance while sitting on edge of bed and cannot stand Level 3 (Stands with assist) - Balance while standing  and cannot march in place Level 5 (Walks with assist in room/hall) - Balance while stepping forward/back and can walk in room with assist - Complete     Is the above level different from baseline mobility prior to current illness? Yes - Recommend PT order -- Yes - Recommend PT order                    DVT prophylaxis: Eliquis Code Status: DNR Family Communication: 2/16 spoke at length with DoElenore Rotahusband) on the phone discussed plan of care answered all questions Status is: Inpatient    Dispo: The patient is from: Home              Anticipated d/c is to: SNF??              Anticipated d/c date is: 3 days              Patient currently is not medically stable to d/c.      Consultants:  Dr. SaEppie Gibsonadiation oncology  Procedures/Significant Events:   07/24/2022 MRI C-spine  extensive osseous metastatic disease with pathologic fracture at the base of dens and C2 right lateral mass. Extraosseous tumor at C1 and C2 likely impinging on the right C2 and C3 nerve roots. 09/13/2022 MRI brain W/W0 contrast; 22 enhancing lesions are identified in the current study, annotated on series 1100. New lesions are marked with double arrows.   New lesions:   Right cerebellar hemisphere, 1 mm, image 122.   Left cerebellar hemisphere, 1 mm, image 139   Enlarged lesions:   Left cerebellar floccule, 12 mm (10 mm on prior), image 127   Right cerebellar hemisphere, 5 mm (3 mm on prior), image 122   Right occipital lobe: 4 mm (3 mm on prior), image 146   Left occipital lobe, 3 mm (2 mm on prior), image 159   Left occipital lobe,  7 mm (5 mm on prior), image 161   Left frontal operculum, 2 mm (1 mm on prior) image 172   Left occipital lobe, 2 mm (1 mm on prior), image 173   Right subinsular region, 5 mm (3 mm on prior) image 176   Right posterior temporal, 3 mm) 1 mm on prior), image 177   Left anterior cingulate, 3 mm (2 mm on prior), image 193   Left posterior frontal, 2.5 mm (1.5 mm on prior) image 195   Right parietal, 4 mm (3 mm on prior), image 196   Left parietal, 1.5 mm (less than 1 mm on prior), image 200   Left frontal lobe, 2 mm (less than 1 mm on prior), image 203   Left parietal lobe, 2 mm (less than 1 mm on prior), image 213   Right frontal lobe, 1.5 mm (less than 1 mm on prior), image 222   Unchanged lesions:   Right cerebellar hemisphere, 1 mm, image 122   Right temporal lobe, 3 mm, image 145   Left posterior temporal, 3 mm, image 184   Left periventricular, 2 mm, image 192   Mild surrounding edema is seen associated with the the 7 mm medial left occipital lobe lesion. No significant vasogenic edema associated with the other lesions. 2/16 MRI Brain wo contrast;The lesion at the left CP angle is grossly similar.  Additional enhancing lesions seen on the prior cannot be accurately assessed without contrast. Consider postcontrast imaging. 2. No significant new mass effect or other obvious acute abnormality. 3. Osseous metastatic disease.  I have personally reviewed and interpreted all radiology studies and my findings are as above.  VENTILATOR SETTINGS:    Cultures   Antimicrobials:    Devices    LINES / TUBES:      Continuous Infusions:  sodium chloride 75 mL/hr at 10/13/22 0404     Objective: Vitals:   10/12/22 0913 10/12/22 1410 10/12/22 2120 10/13/22 0707  BP: 127/75 117/66 123/72 122/73  Pulse: (!) 105 (!) 101 98 98  Resp: 19 20 16 16  $ Temp: 98 F (36.7 C) 98.5 F (36.9 C) 98.2 F (36.8 C) 97.8 F (36.6 C)  TempSrc: Oral Oral Oral Oral  SpO2: 97% 95% 90% 94%  Weight:      Height:        Intake/Output Summary (Last 24 hours) at 10/13/2022 [id number] Last data filed at 10/13/2022 0404 Gross per 24 hour  Intake 1317.42 ml  Output --  Net 1317.42 ml   Filed Weights   10/12/22 0603  Weight: 40.3 kg    Examination:  General: A/O x 4, No acute respiratory distress,  Eyes: negative scleral hemorrhage, negative anisocoria, negative icterus ENT: Negative Runny nose, negative gingival bleeding, Neck:  Negative scars, masses, torticollis, lymphadenopathy, JVD, Neck Brace 2dary to pathologic unstable fracture. Lungs: Clear to auscultation bilaterally without wheezes or crackles Cardiovascular: Tachycardic without murmur gallop or rub normal S1 and S2 Abdomen: negative abdominal pain, nondistended, positive soft, bowel sounds, no rebound, no ascites, no appreciable mass Extremities: No significant cyanosis, clubbing, or edema bilateral lower extremities Skin: Negative rashes, lesions, ulcers Psychiatric:  Negative depression, negative anxiety, negative fatigue, negative mania  Central nervous system:  Cranial nerves II through XII intact, tongue/uvula midline, all  extremities muscle strength 5/5, sensation intact throughout, negative dysarthria, negative expressive aphasia, negative receptive aphasia.  .     Data Reviewed: Care during the described time interval was provided by me .  I have reviewed  this patient's available data, including medical history, events of note, physical examination, and all test results as part of my evaluation.  CBC: Recent Labs  Lab 10/12/22 0445 10/13/22 0628  WBC 8.4 7.4  NEUTROABS 7.1  --   HGB 12.6 12.3  HCT 36.9 36.6  MCV 92.7 93.4  PLT 128* [id number]*   Basic Metabolic Panel: Recent Labs  Lab 10/12/22 0445 10/13/22 0628  NA 132* 135  K 3.4* 2.7*  CL 93* 97*  CO2 25 25  GLUCOSE 104* 116*  BUN 23 18  CREATININE 0.50 0.62  CALCIUM 12.7* 12.2*   GFR: Estimated Creatinine Clearance: 47 mL/min (by C-G formula based on SCr of 0.62 mg/dL). Liver Function Tests: Recent Labs  Lab 10/12/22 0445  AST 102*  ALT 29  ALKPHOS 723*  BILITOT 1.2  PROT 6.7  ALBUMIN 3.8   No results for input(s): "LIPASE", "AMYLASE" in the last 168 hours. Recent Labs  Lab 10/12/22 0652  AMMONIA 23   Coagulation Profile: Recent Labs  Lab 10/12/22 0652  INR 1.3*   Cardiac Enzymes: No results for input(s): "CKTOTAL", "CKMB", "CKMBINDEX", "TROPONINI" in the last 168 hours. BNP (last 3 results) No results for input(s): "PROBNP" in the last 8760 hours. HbA1C: No results for input(s): "HGBA1C" in the last 72 hours. CBG: No results for input(s): "GLUCAP" in the last 168 hours. Lipid Profile: No results for input(s): "CHOL", "HDL", "LDLCALC", "TRIG", "CHOLHDL", "LDLDIRECT" in the last 72 hours. Thyroid Function Tests: Recent Labs    10/12/22 0652  TSH 1.768   Anemia Panel: No results for input(s): "VITAMINB12", "FOLATE", "FERRITIN", "TIBC", "IRON", "RETICCTPCT" in the last 72 hours. Sepsis Labs: Recent Labs  Lab 10/12/22 0445 10/12/22 0652  PROCALCITON  --  0.10  LATICACIDVEN 1.4  --     Recent Results  (from the past 240 hour(s))  Resp panel by RT-PCR (RSV, Flu A&B, Covid) Anterior Nasal Swab     Status: None   Collection Time: 10/12/22  4:35 AM   Specimen: Anterior Nasal Swab  Result Value Ref Range Status   SARS Coronavirus 2 by RT PCR NEGATIVE NEGATIVE Final    Comment: (NOTE) SARS-CoV-2 target nucleic acids are NOT DETECTED.  The SARS-CoV-2 RNA is generally detectable in upper respiratory specimens during the acute phase of infection. The lowest concentration of SARS-CoV-2 viral copies this assay can detect is 138 copies/mL. A negative result does not preclude SARS-Cov-2 infection and should not be used as the sole basis for treatment or other patient management decisions. A negative result may occur with  improper specimen collection/handling, submission of specimen other than nasopharyngeal swab, presence of viral mutation(s) within the areas targeted by this assay, and inadequate number of viral copies(<138 copies/mL). A negative result must be combined with clinical observations, patient history, and epidemiological information. The expected result is Negative.  Fact Sheet for Patients:  EntrepreneurPulse.com.au  Fact Sheet for Healthcare Providers:  IncredibleEmployment.be  This test is no t yet approved or cleared by the [place] FDA and  has been authorized for detection and/or diagnosis of SARS-CoV-2 by FDA under an Emergency Use Authorization (EUA). This EUA will remain  in effect (meaning this test can be used) for the duration of the COVID-19 declaration under Section 564(b)(1) of the Act, 21 U.S.C.section 360bbb-3(b)(1), unless the authorization is terminated  or revoked sooner.       Influenza A by PCR NEGATIVE NEGATIVE Final   Influenza B by PCR NEGATIVE NEGATIVE Final    Comment: (NOTE)  The Xpert Xpress SARS-CoV-2/FLU/RSV plus assay is intended as an aid in the diagnosis of influenza from Nasopharyngeal swab  specimens and should not be used as a sole basis for treatment. Nasal washings and aspirates are unacceptable for Xpert Xpress SARS-CoV-2/FLU/RSV testing.  Fact Sheet for Patients: EntrepreneurPulse.com.au  Fact Sheet for Healthcare Providers: IncredibleEmployment.be  This test is not yet approved or cleared by the [place] FDA and has been authorized for detection and/or diagnosis of SARS-CoV-2 by FDA under an Emergency Use Authorization (EUA). This EUA will remain in effect (meaning this test can be used) for the duration of the COVID-19 declaration under Section 564(b)(1) of the Act, 21 U.S.C. section 360bbb-3(b)(1), unless the authorization is terminated or revoked.     Resp Syncytial Virus by PCR NEGATIVE NEGATIVE Final    Comment: (NOTE) Fact Sheet for Patients: EntrepreneurPulse.com.au  Fact Sheet for Healthcare Providers: IncredibleEmployment.be  This test is not yet approved or cleared by the [place] FDA and has been authorized for detection and/or diagnosis of SARS-CoV-2 by FDA under an Emergency Use Authorization (EUA). This EUA will remain in effect (meaning this test can be used) for the duration of the COVID-19 declaration under Section 564(b)(1) of the Act, 21 U.S.C. section 360bbb-3(b)(1), unless the authorization is terminated or revoked.  Performed at [facility], [place] [address] [place], [place] [address]          Radiology Studies: MR BRAIN WO CONTRAST  Result Date: 10/12/2022 CLINICAL DATA:  Mental status change, unknown cause EXAM: MRI HEAD WITHOUT CONTRAST TECHNIQUE: Multiplanar, multiecho pulse sequences of the brain and surrounding structures were obtained without intravenous contrast. COMPARISON:  MRI head 09/13/2022. FINDINGS: Noncontrast and motion limited study.  Within these limitations: Brain: The lesion at the left CP angle is grossly  similar. Additional enhancing lesions seen on the prior cannot be accurately assessed without contrast. Consider postcontrast imaging. No significant new mass effect. No obvious evidence of acute infarct, acute hemorrhage, midline shift, hydrocephalus or sizeable extra-axial fluid collection. Vascular: Major arterial flow voids are maintained at the skull base. Skull and upper cervical spine: Extensive osseous metastatic disease. Sinuses/Orbits: Clear sinuses.  No acute findings. Other: No mastoid effusions. IMPRESSION: 1. Noncontrast and motion limited study. The lesion at the left CP angle is grossly similar. Additional enhancing lesions seen on the prior cannot be accurately assessed without contrast. Consider postcontrast imaging. 2. No significant new mass effect or other obvious acute abnormality. 3. Osseous metastatic disease. Electronically Signed   By: Margaretha Sheffield M.D.   On: 10/12/2022 12:35   CT Head Wo Contrast  Result Date: 10/12/2022 CLINICAL DATA:  Mental status change with unknown cause EXAM: CT HEAD WITHOUT CONTRAST TECHNIQUE: Contiguous axial images were obtained from the base of the skull through the vertex without intravenous contrast. RADIATION DOSE REDUCTION: This exam was performed according to the departmental dose-optimization program which includes automated exposure control, adjustment of the mA and/or kV according to patient size and/or use of iterative reconstruction technique. COMPARISON:  Brain MRI 09/13/2022 FINDINGS: Brain: No evidence of acute infarction, hemorrhage, hydrocephalus, extra-axial collection or mass lesion/mass effect. Essentially occult metastatic disease to the brain. Reference postcontrast brain MRI 09/13/2022 Vascular: No hyperdense vessel or unexpected calcification. Skull: Heterogeneity of the calvarium from known metastatic disease. No acute osseous finding. Extensive cervical fusion which is minimally covered Sinuses/Orbits: No acute finding IMPRESSION:  No acute or interval finding. Electronically Signed   By: Jorje Guild M.D.   On: 10/12/2022 05:36  DG CHEST PORT 1 VIEW  Result Date: 10/12/2022 CLINICAL DATA:  Altered mental status.  History of breast cancer EXAM: PORTABLE CHEST 1 VIEW COMPARISON:  PET CT 08/03/2022 FINDINGS: Hazy and streaky right density at the lung bases from scarring based on PET CT 08/03/2022. Trace right pleural effusion. Generous left ventricular contour extension weighted by rotation. Atheromatous calcification of the aorta. Postoperative breasts and left axilla. Scoliosis and spinal degeneration with cervical fusion. IMPRESSION: No acute finding when accounting for scarring at the lung bases with trace right pleural effusion. Electronically Signed   By: Jorje Guild M.D.   On: 10/12/2022 04:40        Scheduled Meds:  acidophilus  1 capsule Oral Daily   apixaban  2.5 mg Oral BID   citalopram  40 mg Oral Daily   gabapentin  100 mg Oral TID   levothyroxine  75 mcg Oral Q0600   senna-docusate  1 tablet Oral BID   Continuous Infusions:  sodium chloride 75 mL/hr at 10/13/22 0404     LOS: 1 day    Time spent:40 min    Renatha Rosen, Geraldo Docker, MD [facility]   If 7PM-7AM, please contact night-coverage 10/13/2022, 8:28 AM

## 2022-10-13 NOTE — TOC Initial Note (Addendum)
Transition of Care [facility]) - Initial/Assessment Note    Patient Details  Name: Martha [name] MRN: [id number] Date of Birth: Jul 04, 1961  Transition of Care [facility]) CM/SW Contact:    Angelita Ingles, RN Phone Number:[phone]  10/13/2022, 3:06 PM  Clinical Narrative:                 TOPC following patient with high risk for readmission and home health recommendations. CM at bedside to introduce self and explain role. Patient states that she is from home with her spouse where she has been managing with some assistance since her last hospitalization.patient confirms that her spouse assist her with transportation to appointment and will assist with transportation to radiation treatments.  Patient reports that she has DME ( lift chair, hospital bed, BSC, rolling walker) patient states that she has everything she needs at home but is hopeful that she will be able to get home health for therapy. CM has explained that Home health with [place] is sometimes a challenge but TOC will attempt to set up home health. Patient verbalizes understanding stating that she has had difficulty setting up services with [employer] in the past. Patient states that she has a PCP Dimas Chyle MD at [facility]. Patient states that she does follow up on a regular bases and is currently being seen by oncology at the cancer center. [facility] [address]   Patient offered choice for Home health services. List provided per [email or web address]. Patient states that she has no specific choice as long as insurance will cover. CM has called referral to [facility] [place]. Awaiting response to determine if Alvis Lemmings can offer services.   [place] referral has been accepted by [facility] with Alvis Lemmings.AVS updated  Expected Discharge Plan: [place] Barriers to Discharge: Continued Medical Work up   Patient Goals and CMS Choice Patient states their goals for this hospitalization and ongoing  recovery are:: Wants to get better to go home CMS Medicare.gov Compare Post Acute Care list provided to:: Patient Choice offered to / list presented to : Patient [place] ownership interest in [facility].provided to:: Patient    Expected Discharge Plan and Services In-house Referral: NA Discharge Planning Services: CM Consult Post Acute Care Choice: [place] arrangements for the past 2 months: Single Family Home                 DME Arranged: N/A DME Agency: NA       HH Arranged: PT, OT (pending awaiting agency approvals)          Prior Living Arrangements/Services Living arrangements for the past 2 months: Single Family Home Lives with:: Spouse Patient language and need for interpreter reviewed:: Yes Do you feel safe going back to the place where you live?: Yes      Need for Family Participation in Patient Care: Yes (Comment) Care giver support system in place?: Yes (comment) Current home services: DME (life chair, rollator, BSC, walker) Criminal Activity/Legal Involvement Pertinent to Current Situation/Hospitalization: No - Comment as needed  Activities of Daily Living Home Assistive Devices/Equipment: Wheelchair, Environmental consultant (specify type), Grab bars in shower, Grab bars around toilet, Hand-held shower hose ADL Screening (condition at time of admission) Patient's cognitive ability adequate to safely complete daily activities?: Yes Is the patient deaf or have difficulty hearing?: No Does the patient have difficulty seeing, even when wearing glasses/contacts?: No Does the patient have difficulty concentrating, remembering, or making decisions?:  No Patient able to express need for assistance with ADLs?: Yes Does the patient have difficulty dressing or bathing?: Yes Independently performs ADLs?: No Communication: Independent Dressing (OT): Needs assistance Is this a change from baseline?: Pre-admission baseline Grooming: Needs assistance Is this a change  from baseline?: Pre-admission baseline Feeding: Needs assistance Is this a change from baseline?: Pre-admission baseline Bathing: Needs assistance Is this a change from baseline?: Pre-admission baseline Toileting: Needs assistance Is this a change from baseline?: Pre-admission baseline In/Out Bed: Needs assistance Is this a change from baseline?: Pre-admission baseline Walks in Home: Needs assistance Is this a change from baseline?: Change from baseline, expected to last <3 days Does the patient have difficulty walking or climbing stairs?: Yes Weakness of Legs: Both Weakness of Arms/Hands: Both  Permission Sought/Granted Permission sought to share information with : Family Supports Permission granted to share information with : Yes, Verbal Permission Granted, No              Emotional Assessment Appearance:: Appears stated age Attitude/Demeanor/Rapport: Gracious Affect (typically observed): Accepting, Pleasant Orientation: : Oriented to Self, Oriented to Place, Oriented to  Time, Oriented to Situation Alcohol / Substance Use: Not Applicable Psych Involvement: No (comment)  Admission diagnosis:  Change in mental status [R41.82] Altered mental status, unspecified altered mental status type [R41.82] AMS (altered mental status) [R41.82] Patient Active Problem List   Diagnosis Date Noted   Change in mental status 10/12/2022   AMS (altered mental status) 10/12/2022   Hallucination 10/12/2022   Depression 10/12/2022   Cancer associated pain 10/12/2022   Acute incomplete quadriplegia ([place]) 09/14/2022   Cervical myelopathy ([name]) 09/13/2022   Elevated alkaline phosphatase level 09/04/2022   Paresthesia and weakness of both lower extremities 09/04/2022   Secondary cancer of brain ([place]) 09/01/2022   C2 cervical fracture ([place]) 08/17/2022   Metastatic adenocarcinoma ([place]) 08/17/2022   Genetic testing 08/14/2022   Cancer, metastatic to bone ([place]) 08/02/2022   Family history of  melanoma 08/02/2022   Family history of breast cancer 08/02/2022   Cervicalgia 06/16/2022   Stress 05/04/2022   Hyperglycemia 04/14/2021   Lobular carcinoma in situ (LCIS) of right breast 02/04/2020   Malignant neoplasm of lower-inner quadrant of left breast in female, estrogen receptor positive ([place]) 01/22/2018   Hypothyroidism 04/04/2017   Nicotine dependence with current use 04/04/2017   Excessive drinking of alcohol 04/04/2017   Menopausal symptom 04/04/2017   PCP:  [name], MD [phone] [place] [place] [facility] [address] [place] [address] Phone: [phone] Fax: [phone]  [name] [address] [place] [address] Phone: [phone] Fax: [phone]     Social Determinants of Health (SDOH) Social History: [place]: No Food Insecurity (09/04/2022)  Housing: Low Risk  (09/04/2022)  Transportation Needs: No Transportation Needs (09/04/2022)  Utilities: Not At Risk (09/04/2022)  Depression (PHQ2-9): Low Risk  (05/04/2022)  Tobacco Use: High Risk (10/12/2022)   SDOH Interventions:     Readmission Risk Interventions    10/13/2022    2:48 PM 09/07/2022    4:20 PM  Readmission Risk Prevention Plan  Transportation Screening Complete Complete  PCP or Specialist Appt within 5-7 Days  Complete  Home Care Screening  Complete  Medication Review (RN CM)  Complete  Medication Review (RN Care Manager) Complete   PCP or Specialist appointment within 3-5 days of discharge Complete   HRI or Home Care Consult Complete  SW Recovery Care/Counseling Consult Complete   Palliative Care Screening Not Applicable   Skilled Nursing Facility Not Applicable

## 2022-10-13 NOTE — Progress Notes (Signed)
I spoke with Dr. Sherral Hammers about our mutual patient.  She was recently admitted with altered mental status but is doing better.  That said, given her clinical findings and recent admission, I recommend that her radiation plan for her lower spine (L5-S3) be changed from [place] in 5 fractions to [place] in 1 fraction.  That will allow her to receive and complete her treatment today and have time to recuperate, and possibly be discharged, before she starts systemic therapy later next week.  I will ask our staff to page me so I can talk to the patient about this change in plan before she receives her treatment today.  -----------------------------------  Eppie Gibson, MD

## 2022-10-14 DIAGNOSIS — C7951 Secondary malignant neoplasm of bone: Secondary | ICD-10-CM | POA: Diagnosis not present

## 2022-10-14 DIAGNOSIS — R443 Hallucinations, unspecified: Secondary | ICD-10-CM | POA: Diagnosis not present

## 2022-10-14 DIAGNOSIS — R4182 Altered mental status, unspecified: Secondary | ICD-10-CM | POA: Diagnosis not present

## 2022-10-14 LAB — CBC WITH DIFFERENTIAL/PLATELET
Abs Immature Granulocytes: 0.26 10*3/uL — ABNORMAL HIGH (ref 0.00–0.07)
Basophils Absolute: 0 10*3/uL (ref 0.0–0.1)
Basophils Relative: 0 %
Eosinophils Absolute: 0 10*3/uL (ref 0.0–0.5)
Eosinophils Relative: 1 %
HCT: 31.5 % — ABNORMAL LOW (ref 36.0–46.0)
Hemoglobin: 10.7 g/dL — ABNORMAL LOW (ref 12.0–15.0)
Immature Granulocytes: 5 %
Lymphocytes Relative: 4 %
Lymphs Abs: 0.2 10*3/uL — ABNORMAL LOW (ref 0.7–4.0)
MCH: 31.9 pg (ref 26.0–34.0)
MCHC: 34 g/dL (ref 30.0–36.0)
MCV: 94 fL (ref 80.0–100.0)
Monocytes Absolute: 0.4 10*3/uL (ref 0.1–1.0)
Monocytes Relative: 6 %
Neutro Abs: 4.9 10*3/uL (ref 1.7–7.7)
Neutrophils Relative %: 84 %
Platelets: 117 10*3/uL — ABNORMAL LOW (ref 150–400)
RBC: 3.35 MIL/uL — ABNORMAL LOW (ref 3.87–5.11)
RDW: 14.4 % (ref 11.5–15.5)
WBC: 5.7 10*3/uL (ref 4.0–10.5)
nRBC: 0 % (ref 0.0–0.2)

## 2022-10-14 LAB — COMPREHENSIVE METABOLIC PANEL
ALT: 28 U/L (ref 0–44)
AST: 94 U/L — ABNORMAL HIGH (ref 15–41)
Albumin: 3.4 g/dL — ABNORMAL LOW (ref 3.5–5.0)
Alkaline Phosphatase: 737 U/L — ABNORMAL HIGH (ref 38–126)
Anion gap: 11 (ref 5–15)
BUN: 20 mg/dL (ref 8–23)
CO2: 23 mmol/L (ref 22–32)
Calcium: 10.1 mg/dL (ref 8.9–10.3)
Chloride: 104 mmol/L (ref 98–111)
Creatinine, Ser: 0.57 mg/dL (ref 0.44–1.00)
GFR, Estimated: >60 mL/min (ref >60)
Glucose, Bld: 123 mg/dL — ABNORMAL HIGH (ref 70–99)
Potassium: 3.3 mmol/L — ABNORMAL LOW (ref 3.5–5.1)
Sodium: 138 mmol/L (ref 135–145)
Total Bilirubin: 0.8 mg/dL (ref 0.3–1.2)
Total Protein: 5.8 g/dL — ABNORMAL LOW (ref 6.5–8.1)

## 2022-10-14 LAB — MAGNESIUM: Magnesium: 1.8 mg/dL (ref 1.7–2.4)

## 2022-10-14 LAB — PHOSPHORUS: Phosphorus: 1.9 mg/dL — ABNORMAL LOW (ref 2.5–4.6)

## 2022-10-14 MED ORDER — POTASSIUM CHLORIDE CRYS ER 20 MEQ PO TBCR
40.0000 meq | EXTENDED_RELEASE_TABLET | Freq: Once | ORAL | Status: AC
  Administered 2022-10-14: 40 meq via ORAL
  Filled 2022-10-14: qty 2

## 2022-10-14 MED ORDER — MAGNESIUM SULFATE 2 GM/50ML IV SOLN
2.0000 g | Freq: Once | INTRAVENOUS | Status: AC
  Administered 2022-10-14: 2 g via INTRAVENOUS
  Filled 2022-10-14: qty 50

## 2022-10-14 MED ORDER — ADULT MULTIVITAMIN W/MINERALS CH
1.0000 | ORAL_TABLET | Freq: Every day | ORAL | Status: DC
  Administered 2022-10-14 – 2022-10-20 (×7): 1 via ORAL
  Filled 2022-10-14 (×7): qty 1

## 2022-10-14 MED ORDER — POTASSIUM PHOSPHATES 15 MMOLE/5ML IV SOLN
30.0000 mmol | Freq: Once | INTRAVENOUS | Status: AC
  Administered 2022-10-14: 30 mmol via INTRAVENOUS
  Filled 2022-10-14: qty 10

## 2022-10-14 MED ORDER — ENSURE ENLIVE PO LIQD
237.0000 mL | Freq: Three times a day (TID) | ORAL | Status: DC
  Administered 2022-10-14 – 2022-10-20 (×12): 237 mL via ORAL

## 2022-10-14 NOTE — Progress Notes (Signed)
Initial Nutrition Assessment  DOCUMENTATION CODES:   Underweight  INTERVENTION:   Multivitamin w/ minerals daily Ensure Enlive po TID, each supplement provides 350 kcal and 20 grams of protein. Mighty Shake BID with meals, each supplement provides 330 kcals and 9 grams of protein  NUTRITION DIAGNOSIS:   Increased nutrient needs related to cancer and cancer related treatments as evidenced by estimated needs.  GOAL:   Patient will meet greater than or equal to 90% of their needs  MONITOR:   PO intake, Supplement acceptance, Weight trends, Labs  REASON FOR ASSESSMENT:   Consult Assessment of nutrition requirement/status, Calorie Count  ASSESSMENT:   62 y.o. female presented to the ED with confusion and weakness. PMH includes metastatic breast cancer to the cervical spine and brain. Pt admitted with hallucination of unknown etiology.   RD working remotely at time of assessment. Discussed with RN, pt mentation has improved still some confusion. She did eat some breakfast. RD dicussed with RN oral nutrition supplements in the setting of poor PO intake and recent significant weight loss.   Per EMR, pt has had a 22.3% weight loss within <6 months, this is clinically significant for time frame.  Wt Readings from Last 15 Encounters:  10/12/22 40.3 kg  09/26/22 40.3 kg  09/04/22 44 kg  08/30/22 44.3 kg  08/17/22 46.3 kg  08/16/22 46.3 kg  08/04/22 47.8 kg  08/01/22 47.2 kg  07/25/22 46.7 kg  07/18/22 46.7 kg  06/25/22 48.5 kg  06/16/22 49.5 kg  05/04/22 51.9 kg  04/28/21 53 kg  04/12/21 52.8 kg    Medications reviewed and include: Risaquad, Senokot-S Labs reviewed: Sodium 138, Potassium 3.3, Phosphorus 1.9, Magnesium 1.8   NUTRITION - FOCUSED PHYSICAL EXAM:  Deferred to follow-up.  Diet Order:   Diet Order             Diet regular Room service appropriate? Yes; Fluid consistency: Thin  Diet effective now                   EDUCATION NEEDS:   Not  appropriate for education at this time  Skin:  Skin Assessment: Reviewed RN Assessment  Last BM:  2/16 - Type 5  Height:  Ht Readings from Last 1 Encounters:  10/12/22 5' 2"$  (1.575 m)   Weight:  Wt Readings from Last 1 Encounters:  10/12/22 40.3 kg   Ideal Body Weight:  50 kg  BMI:  Body mass index is 16.25 kg/m.  Estimated Nutritional Needs:  Kcal:  1500-1700 Protein:  75-90 grams Fluid:  >/= 1.5 L   Hermina Barters RD, LDN Clinical Dietitian See [facility] for contact information.

## 2022-10-14 NOTE — Progress Notes (Addendum)
Physical Therapy Treatment Patient Details Name: Martha [name] MRN: [id number] DOB: Nov 19, 1960 Today's Date: 10/14/2022   History of Present Illness Martha [name] is a 62 y.o. female admitted with hallucination. 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    PT Comments    Pt received supine in bed reporting no pain in cervical collar, able to name self, location, year, and family members in room without cuing, unable to recall cervical precautions so reviewed verbally. Pt completed logroll with mod assist, upon sitting at EOB pt exhibiting strong posterior lean that required mod assist +1 to correct, pt able to self-correct over time with cuing; pt sat EOB ~55mn, reporting pain in RLQ near R ASIS, some firmness present and tender to palpation, RN notified. Pt completed transfers with mod assist +2, blocking of BLE and lift assistance via chuck pad, multimodal cuing for "nose over toes" as pt attempts to rise in vertical positioning. Pt took several forward and backward steps EOB toward sink, required total assist for AD management and mod assist for steadying. Pt in recliner at end of session with 3/10 pain and requesting pain meds, RN notified. Pt on 2LO2 via [place], 100% throughout session via personal pulse ox. Educated pt and family about activity tolerance and scheduling things throughout the day to allow for rest periods due to fatigue, encouraged pt to continue to try to eat, pt and family verbalized understanding. Discharge destination remains appropriate, we will continue to follow acutely.    Recommendations for follow up therapy are one component of a multi-disciplinary discharge planning process, led by the attending physician.  Recommendations may be updated based on patient status, additional functional criteria and insurance  authorization.  Follow Up Recommendations  Home health PT     Assistance Recommended at Discharge Frequent or constant Supervision/Assistance  Patient can return home with the following A little help with walking and/or transfers;A little help with bathing/dressing/bathroom;Assistance with cooking/housework;Assist for transportation;Help with stairs or ramp for entrance   Equipment Recommendations  None recommended by PT    Recommendations for Other Services       Precautions / Restrictions Precautions Precautions: Cervical;Fall Precaution Booklet Issued: No Precaution Comments: verbally reviewed and pt able to recall with minimal cueing Required Braces or Orthoses: Cervical Brace Cervical Brace: Hard collar Restrictions Weight Bearing Restrictions: No Other Position/Activity Restrictions: reprots she prefers to wear the collar for comfort -- she states she can take it off in bed. Will need to verify.     Mobility  Bed Mobility Overal bed mobility: Needs Assistance Bed Mobility: Rolling, Sidelying to Sit, Sit to Sidelying Rolling: Min assist Sidelying to sit: Mod assist     Sit to sidelying: Mod assist General bed mobility comments: min A to come to sidelying, mod A to upright trunk into sitting. Exhibits a posterior bias as well throghout movement that requires at least mod assist to correct, with cuing pt able to sit more upright EOB over time but at most gains vertical positioning. Sat EOB for at least 348m.    Transfers Overall transfer level: Needs assistance Equipment used: Rolling walker (2 wheels) Transfers: Sit to/from Stand Sit to Stand: Mod assist, +2 physical assistance   Step pivot transfers: Mod assist, +2 safety/equipment       General transfer comment: Pt required mod assist +2 for lifting assistance with chuck pad, multimodal cuing for  hand placement  and sequencing especially pushing up from bed and bringing "nose over toes" including physical  demonstration by PT and family member. Pt required blocking her feet and and getting her more anterior to reduce posterior lean.    Ambulation/Gait Ambulation/Gait assistance: Mod assist, +2 safety/equipment Gait Distance (Feet): 5 Feet Assistive device: Rolling walker (2 wheels) Gait Pattern/deviations: Step-to pattern, Decreased step length - right, Decreased weight shift to left, Knee hyperextension - left, Wide base of support Gait velocity: decreased     General Gait Details: Pt required mod assist +2 for short-distance ambulation in room, both forward and backward ambulation, total assist for AD management and mod assist for steadying as pt demonstrating posterior lean, requiring multimodal cuing for advancement of bilateral LEs, pt with strong R lateral lean and LLE in extension. Pt unable to step backward with heel-toe gait and slides feet on floor.   Stairs             Wheelchair Mobility    Modified Rankin (Stroke Patients Only)       Balance Overall balance assessment: Needs assistance Sitting-balance support: Feet supported, Bilateral upper extremity supported Sitting balance-Leahy Scale: Poor Sitting balance - Comments: mod A to maintain static, supported sitting due to posterior lean Postural control: Posterior lean Standing balance support: During functional activity, Bilateral upper extremity supported, Reliant on assistive device for balance Standing balance-Leahy Scale: Poor Standing balance comment: mod A from therapist due to posterior lean                            Cognition Arousal/Alertness: Awake/alert Behavior During Therapy: WFL for tasks assessed/performed Overall Cognitive Status: Within Functional Limits for tasks assessed                                 General Comments: AO to self, location, and year, names family members in room. She is aware she is having hallucinations and reported "sometimes I see people who  aren't there'        Exercises      General Comments        Pertinent Vitals/Pain Pain Assessment Pain Assessment: 0-10 Pain Score: 3  Breathing: normal Negative Vocalization: none Facial Expression: smiling or inexpressive Body Language: relaxed Consolability: no need to console PAINAD Score: 0 Pain Location: back and right flank pain, close to R ASIS Pain Descriptors / Indicators: Grimacing, Guarding, Sharp Pain Intervention(s): Limited activity within patient's tolerance, Monitored during session, Repositioned    Home Living Family/patient expects to be discharged to:: Private residence Living Arrangements: Spouse/significant other Available Help at Discharge: Family;Friend(s);Available PRN/intermittently (Husband works, has flexible job hours, lots of family and friends available to assist) Type of Home: House Home Access: Stairs to enter Entrance Stairs-Rails: Psychiatric nurse of Steps: 2 Alternate Level Stairs-Number of Steps: 1 step down into living room, with grab bar Home Layout: One [place]: [place] (4 wheels);Rolling Walker (2 wheels);Wheelchair - Sport and exercise psychologist Comments: Has RW, rollator, WC from her mother, shower chair and adjustable bed (HOB and foot of bed but not height wise), lift chair    Prior Function            PT Goals (current goals can now be found in the care plan section) Acute Rehab PT Goals Patient Stated Goal: less pain, regain strength PT Goal Formulation: With patient/family Time For Goal Achievement: 10/26/22 Potential to  Achieve Goals: Good Progress towards PT goals: Progressing toward goals    Frequency    Min 3X/week      PT Plan      Co-evaluation              AM-PAC PT "6 Clicks" Mobility   Outcome Measure  Help needed turning from your back to your side while in a flat bed without using bedrails?: A Lot Help needed moving from lying on your back to sitting on  the side of a flat bed without using bedrails?: A Lot Help needed moving to and from a bed to a chair (including a wheelchair)?: A Lot Help needed standing up from a chair using your arms (e.g., wheelchair or bedside chair)?: A Lot Help needed to walk in hospital room?: Total Help needed climbing 3-5 steps with a railing? : Total 6 Click Score: 10    End of Session Equipment Utilized During Treatment: Gait belt Activity Tolerance: Patient tolerated treatment well Patient left: with call bell/phone within reach;with family/visitor present;in chair;with chair alarm set Nurse Communication: Mobility status PT Visit Diagnosis: Other abnormalities of gait and mobility (R26.89);Muscle weakness (generalized) (M62.81);Difficulty in walking, not elsewhere classified (R26.2);Other symptoms and signs involving the nervous system (R29.898)     Time: [id number] PT Time Calculation (min) (ACUTE ONLY): 26 min  Charges:  $Gait Training: 8-22 mins $Therapeutic Activity: 8-22 mins                     Coolidge Breeze, PT, DPT [facility] Rehabilitation Department Office: [phone] Weekend pager: [phone]   Coolidge Breeze 10/14/2022, 12:45 PM

## 2022-10-14 NOTE — Progress Notes (Signed)
PROGRESS NOTE    Martha [name]  [id number] DOB: 1961-08-05 DOA: 10/12/2022 PCP: [name], MD     Brief Narrative:   62 y.o. WF PMHx anxiety, depression, dysrhythmia, ER positive breast cancer diagnosed 12/2017 with metastasis to cervical spine and brain discovered in July 2023   Admitted to the hospital for evaluation and management of confusion and weakness.  The patient was recently discharged from [facility] health inpatient rehab on 1/31.  She is currently undergoing radiation to her brain.  She has some chronic dizziness, and sensation of falling out of the bed, husband states that this has been going on for several weeks especially after her more recent cervical spine surgery in January 2024.  This problem seems to come and go, has been worse in the last 2 to 3 days.  Patient's husband also states that she has been somewhat confused and having some hallucinations, for example seeing people in her room and even this morning here in the hospital who are not present.  She has never really had this problem before, started in the last 3 to 4 days.  Per the patient and her husband, it seems that this confusion is somewhat transient, does not last very long.  While she was in rehab, her Celexa dose was increased to 40 mg, her gabapentin was titrated up to 200 mg p.o. 3 times daily.  There have been no other significant medication changes, and there has not been any medication changes since discharge from rehab.  Overall patient and her husband feel that she has been doing well considering what she has been through, she continues to ambulate with a walker, she is supposed to start outpatient therapy soon.   ED Course: Husband called EMS and the patient was brought to the emergency department for evaluation, where she has unremarkable vital signs and is stable on room air.  So far lab work reveals normal white blood cell count, normal hemoglobin, platelets 128.  Sodium 132, potassium 3.4.   Renal function is normal.  Calcium 12.7.  Lactate is normal at 1.4.   Subjective: 2/17 afebrile overnight A/O x 4.  Positive dysphagia.  Negative hallucination    Assessment & Plan: Covid vaccination;   Principal Problem:   Hallucination Active Problems:   Malignant neoplasm of lower-inner quadrant of left breast in female, estrogen receptor positive ([place])   Cervicalgia   Cancer, metastatic to bone ([place])   Change in mental status   AMS (altered mental status)   Depression   Cancer associated pain  Malignant neoplasm of lower-inner quadrant of LEFT breast, ER positive  -Patient sees Dr. Burr Medico oncologist and Dr. Eppie Gibson radiation oncologist -Dx 12/2017 - S/p LEFT breast lumpectomy and adjuvant XRT - Letrozole started 05/2018 - lost f/u after visit in 04/2020 until her recurrence in 07/2022     Cancer, metastatic to bone  -07/24/2022 MRI C-spine C-spine mets see below extensive osseous metastatic disease with pathologic fracture at the base of dens and C2 right lateral mass. Extraosseous tumor at C1 and C2 likely impinging on the right C2 and C3 nerve roots.  -08/03/2022 PET scan diffuse bone mets -08/17/2022 cervical laminectomy and fusion by Dr. Marcello Moores biopsy confirmed metastatic breast cancer, ER/PR negative, HER2 positive. -08/17/2022 due to cervical cord compression with myelopathy, she underwent a second cervical spine surgery on September 06, 2022 -S/p brain XRT -S/p chemotherapy -2/16 per Dr.Feng oncology note from 2/15 treatment plan is as follows  -lab and brain MRI reviewed,  unremarkable except calcium is elevated 12.7 today, which can contributed to her confusion. -will give her a doze of zometa 23m  -Her gabapentin has been dose reduced, she is tolerating Norco well,  -Urine and blood culture are pending -Patient is scheduled to start chemotherapy next week.   -2/16 Discussed case with Dr. SEppie Gibsonradiation oncologist revised XRT plan.  Now will only receive 1  dose of L-spine XRT today. -2/17 completed XRT treatment x 1 states back pain 3/10 today  Altered mental status - Most likely multifactorial, brain mets, pain management medication, probably severe protein calorie malnutrition, possible infection. - 2/15 blood NGTD -2/15 urine negative -2/16 patient continues to have waxing and waning hallucinations (sees her nephew) -2/16 ammonia, TSH WNL -2/16 will consult PT/OT in a.m. -2/16 decrease Gabapentin 100 mg TID -2/17 patient appears to be A/O x 4 but having episodes of expressive aphasia  Depression - Celexa  Hypokalemia - Potassium goal> 4 - 2/16 K-Phos 30 mmol - 2/16 K-Dur 40 mEq -2/17 K-Phos 30 mmol - 2/17 K-Dur 40 mEq  Hypomagnesmia - Magnesium goal> 2 - 2/17 MagnesiumIV 2 g   Severe protein calorie malnutrition -2/16 D5-0.9% saline 76mhr - 2/16 nutrition consult  Goals of care - 2/17 Palliative Care consult: Patient and family would like to speak with palliative care concerning options.      Mobility Assessment (last 72 hours)     Mobility Assessment     Row Name 10/14/22 1238 10/13/22 1050 10/13/22 0930 10/12/22 2125 10/12/22 1417   Does patient have an order for bedrest or is patient medically unstable -- -- No - Continue assessment No - Continue assessment --   What is the highest level of mobility based on the progressive mobility assessment? Level 4 (Walks with assist in room) - Balance while marching in place and cannot step forward and back - Complete Level 3 (Stands with assist) - Balance while standing  and cannot march in place Level 5 (Walks with assist in room/hall) - Balance while stepping forward/back and can walk in room with assist - Complete Level 2 (Chairfast) - Balance while sitting on edge of bed and cannot stand Level 3 (Stands with assist) - Balance while standing  and cannot march in place   Is the above level different from baseline mobility prior to current illness? -- -- Yes - Recommend PT  order Yes - Recommend PT order --    Row Name 10/12/22 1100           Does patient have an order for bedrest or is patient medically unstable No - Continue assessment       What is the highest level of mobility based on the progressive mobility assessment? Level 5 (Walks with assist in room/hall) - Balance while stepping forward/back and can walk in room with assist - Complete       Is the above level different from baseline mobility prior to current illness? Yes - Recommend PT order                      DVT prophylaxis: Eliquis Code Status: DNR Family Communication: 2/17 DoElenore Rotahusband), and sister at bedside discussed plan of care all questions answered.   Status is: Inpatient    Dispo: The patient is from: Home              Anticipated d/c is to: SNF??              Anticipated  d/c date is: 3 days              Patient currently is not medically stable to d/c.      Consultants:  Dr. Eppie Gibson radiation oncology  Procedures/Significant Events:  07/24/2022 MRI C-spine  extensive osseous metastatic disease with pathologic fracture at the base of dens and C2 right lateral mass. Extraosseous tumor at C1 and C2 likely impinging on the right C2 and C3 nerve roots. 09/13/2022 MRI brain W/W0 contrast; 22 enhancing lesions are identified in the current study, annotated on series 1100. New lesions are marked with double arrows.   New lesions:   Right cerebellar hemisphere, 1 mm, image 122.   Left cerebellar hemisphere, 1 mm, image 139   Enlarged lesions:   Left cerebellar floccule, 12 mm (10 mm on prior), image 127   Right cerebellar hemisphere, 5 mm (3 mm on prior), image 122   Right occipital lobe: 4 mm (3 mm on prior), image 146   Left occipital lobe, 3 mm (2 mm on prior), image 159   Left occipital lobe, 7 mm (5 mm on prior), image 161   Left frontal operculum, 2 mm (1 mm on prior) image 172   Left occipital lobe, 2 mm (1 mm on prior), image 173    Right subinsular region, 5 mm (3 mm on prior) image 176   Right posterior temporal, 3 mm) 1 mm on prior), image 177   Left anterior cingulate, 3 mm (2 mm on prior), image 193   Left posterior frontal, 2.5 mm (1.5 mm on prior) image 195   Right parietal, 4 mm (3 mm on prior), image 196   Left parietal, 1.5 mm (less than 1 mm on prior), image 200   Left frontal lobe, 2 mm (less than 1 mm on prior), image 203   Left parietal lobe, 2 mm (less than 1 mm on prior), image 213   Right frontal lobe, 1.5 mm (less than 1 mm on prior), image 222   Unchanged lesions:   Right cerebellar hemisphere, 1 mm, image 122   Right temporal lobe, 3 mm, image 145   Left posterior temporal, 3 mm, image 184   Left periventricular, 2 mm, image 192   Mild surrounding edema is seen associated with the the 7 mm medial left occipital lobe lesion. No significant vasogenic edema associated with the other lesions. 2/16 MRI Brain wo contrast;The lesion at the left CP angle is grossly similar. Additional enhancing lesions seen on the prior cannot be accurately assessed without contrast. Consider postcontrast imaging. 2. No significant new mass effect or other obvious acute abnormality. 3. Osseous metastatic disease.  I have personally reviewed and interpreted all radiology studies and my findings are as above.  VENTILATOR SETTINGS:    Cultures   Antimicrobials:    Devices    LINES / TUBES:      Continuous Infusions:  dextrose 5 % and 0.9% NaCl 75 mL/hr at 10/14/22 0936     Objective: Vitals:   10/13/22 2300 10/14/22 0000 10/14/22 0046 10/14/22 0525  BP:   118/62 109/64  Pulse:   (!) 101 89  Resp: (!) 26 (!) 26 (!) 23 19  Temp:   97.7 F (36.5 C) 98 F (36.7 C)  TempSrc:   Oral Oral  SpO2:   99% 100%  Weight:      Height:        Intake/Output Summary (Last 24 hours) at 10/14/2022 1253 Last data filed at  10/14/2022 1030 Gross per 24 hour  Intake 1529.38 ml  Output --   Net 1529.38 ml    Filed Weights   10/12/22 0603  Weight: 40.3 kg    Physical Exam:  General: A/O x 4, No acute respiratory distress Eyes: negative scleral hemorrhage, negative anisocoria, negative icterus ENT: Negative Runny nose, negative gingival bleeding, Neck:  Negative scars, masses, torticollis, lymphadenopathy, JVD Lungs: Clear to auscultation bilaterally without wheezes or crackles Cardiovascular: Regular rate and rhythm without murmur gallop or rub normal S1 and S2 Abdomen: negative abdominal pain, nondistended, positive soft, bowel sounds, no rebound, no ascites, no appreciable mass Extremities: No significant cyanosis, clubbing, or edema bilateral lower extremities Skin: Negative rashes, lesions, ulcers Psychiatric:  Negative depression, negative anxiety, negative fatigue, negative mania  Central nervous system:  Cranial nerves II through XII intact, tongue/uvula midline, all extremities muscle strength 5/5, sensation intact throughout,negative dysarthria, positive expressive aphasia, negative receptive aphasia.   .     Data Reviewed: Care during the described time interval was provided by me .  I have reviewed this patient's available data, including medical history, events of note, physical examination, and all test results as part of my evaluation.  CBC: Recent Labs  Lab 10/12/22 0445 10/13/22 0628 10/14/22 0606  WBC 8.4 7.4 5.7  NEUTROABS 7.1 5.8 4.9  HGB 12.6 12.3 10.7*  HCT 36.9 36.6 31.5*  MCV 92.7 93.4 94.0  PLT 128* 135* 117*    Basic Metabolic Panel: Recent Labs  Lab 10/12/22 0445 10/13/22 0600 10/13/22 0628 10/14/22 0606  NA 132*  --  135 138  K 3.4*  --  2.7* 3.3*  CL 93*  --  97* 104  CO2 25  --  25 23  GLUCOSE 104*  --  116* 123*  BUN 23  --  18 20  CREATININE 0.50  --  0.62 0.57  CALCIUM 12.7*  --  12.2* 10.1  MG  --  2.3  --  1.8  PHOS  --  2.6  --  1.9*    GFR: Estimated Creatinine Clearance: 47 mL/min (by C-G formula based  on SCr of 0.57 mg/dL). Liver Function Tests: Recent Labs  Lab 10/12/22 0445 10/14/22 0606  AST 102* 94*  ALT 29 28  ALKPHOS 723* 737*  BILITOT 1.2 0.8  PROT 6.7 5.8*  ALBUMIN 3.8 3.4*    No results for input(s): "LIPASE", "AMYLASE" in the last 168 hours. Recent Labs  Lab 10/12/22 0652  AMMONIA 23    Coagulation Profile: Recent Labs  Lab 10/12/22 0652  INR 1.3*    Cardiac Enzymes: No results for input(s): "CKTOTAL", "CKMB", "CKMBINDEX", "TROPONINI" in the last 168 hours. BNP (last 3 results) No results for input(s): "PROBNP" in the last 8760 hours. HbA1C: No results for input(s): "HGBA1C" in the last 72 hours. CBG: No results for input(s): "GLUCAP" in the last 168 hours. Lipid Profile: No results for input(s): "CHOL", "HDL", "LDLCALC", "TRIG", "CHOLHDL", "LDLDIRECT" in the last 72 hours. Thyroid Function Tests: Recent Labs    10/12/22 0652  TSH 1.768    Anemia Panel: No results for input(s): "VITAMINB12", "FOLATE", "FERRITIN", "TIBC", "IRON", "RETICCTPCT" in the last 72 hours. Sepsis Labs: Recent Labs  Lab 10/12/22 0445 10/12/22 0652  PROCALCITON  --  0.10  LATICACIDVEN 1.4  --      Recent Results (from the past 240 hour(s))  Resp panel by RT-PCR (RSV, Flu A&B, Covid) Anterior Nasal Swab     Status: None   Collection Time: 10/12/22  4:35 AM   Specimen: Anterior Nasal Swab  Result Value Ref Range Status   SARS Coronavirus 2 by RT PCR NEGATIVE NEGATIVE Final    Comment: (NOTE) SARS-CoV-2 target nucleic acids are NOT DETECTED.  The SARS-CoV-2 RNA is generally detectable in upper respiratory specimens during the acute phase of infection. The lowest concentration of SARS-CoV-2 viral copies this assay can detect is 138 copies/mL. A negative result does not preclude SARS-Cov-2 infection and should not be used as the sole basis for treatment or other patient management decisions. A negative result may occur with  improper specimen collection/handling,  submission of specimen other than nasopharyngeal swab, presence of viral mutation(s) within the areas targeted by this assay, and inadequate number of viral copies(<138 copies/mL). A negative result must be combined with clinical observations, patient history, and epidemiological information. The expected result is Negative.  Fact Sheet for Patients:  EntrepreneurPulse.com.au  Fact Sheet for Healthcare Providers:  IncredibleEmployment.be  This test is no t yet approved or cleared by the [place] FDA and  has been authorized for detection and/or diagnosis of SARS-CoV-2 by FDA under an Emergency Use Authorization (EUA). This EUA will remain  in effect (meaning this test can be used) for the duration of the COVID-19 declaration under Section 564(b)(1) of the Act, 21 U.S.C.section 360bbb-3(b)(1), unless the authorization is terminated  or revoked sooner.       Influenza A by PCR NEGATIVE NEGATIVE Final   Influenza B by PCR NEGATIVE NEGATIVE Final    Comment: (NOTE) The Xpert Xpress SARS-CoV-2/FLU/RSV plus assay is intended as an aid in the diagnosis of influenza from Nasopharyngeal swab specimens and should not be used as a sole basis for treatment. Nasal washings and aspirates are unacceptable for Xpert Xpress SARS-CoV-2/FLU/RSV testing.  Fact Sheet for Patients: EntrepreneurPulse.com.au  Fact Sheet for Healthcare Providers: IncredibleEmployment.be  This test is not yet approved or cleared by the [place] FDA and has been authorized for detection and/or diagnosis of SARS-CoV-2 by FDA under an Emergency Use Authorization (EUA). This EUA will remain in effect (meaning this test can be used) for the duration of the COVID-19 declaration under Section 564(b)(1) of the Act, 21 U.S.C. section 360bbb-3(b)(1), unless the authorization is terminated or revoked.     Resp Syncytial Virus by PCR NEGATIVE  NEGATIVE Final    Comment: (NOTE) Fact Sheet for Patients: EntrepreneurPulse.com.au  Fact Sheet for Healthcare Providers: IncredibleEmployment.be  This test is not yet approved or cleared by the [place] FDA and has been authorized for detection and/or diagnosis of SARS-CoV-2 by FDA under an Emergency Use Authorization (EUA). This EUA will remain in effect (meaning this test can be used) for the duration of the COVID-19 declaration under Section 564(b)(1) of the Act, 21 U.S.C. section 360bbb-3(b)(1), unless the authorization is terminated or revoked.  Performed at [facility], [place] [address] [place], [place] [address]   Blood Culture (routine x 2)     Status: None (Preliminary result)   Collection Time: 10/12/22  4:35 AM   Specimen: BLOOD  Result Value Ref Range Status   Specimen Description   Final    BLOOD LEFT ANTECUBITAL Performed at [facility] [address], [place], [place] [address]    Special Requests   Final    BOTTLES DRAWN AEROBIC AND ANAEROBIC Blood Culture adequate volume Performed at [facility] [address], [place], [place] [address]    Culture   Final    NO GROWTH  2 DAYS Performed at [facility], [place] [address] [place], [place] [address]    Report Status PENDING  Incomplete  Blood Culture (routine x 2)     Status: None (Preliminary result)   Collection Time: 10/12/22  4:45 AM   Specimen: BLOOD  Result Value Ref Range Status   Specimen Description   Final    BLOOD RIGHT ANTECUBITAL Performed at [facility] [address], [place], [place] [address]    Special Requests   Final    BOTTLES DRAWN AEROBIC AND ANAEROBIC Blood Culture results may not be optimal due to an excessive volume of blood received in culture bottles Performed at [facility] [address] [place], [place] [address]    Culture    Final    NO GROWTH 2 DAYS Performed at [facility] [address] [place], [place] [address]    Report Status PENDING  Incomplete  Urine Culture (for pregnant, neutropenic or urologic patients or patients with an indwelling urinary catheter)     Status: None   Collection Time: 10/12/22  6:43 AM   Specimen: Urine, Clean Catch  Result Value Ref Range Status   Specimen Description   Final    URINE, CLEAN CATCH Performed at [facility], [place] [address], [place], [place] [address]    Special Requests   Final    NONE Performed at [facility], [place] [address], [place], [place] [address]    Culture   Final    NO GROWTH Performed at [facility], [place] [address] [place], [place] [address]    Report Status 10/13/2022 FINAL  Final         Radiology Studies: No results found.      Scheduled Meds:  acidophilus  1 capsule Oral Daily   apixaban  2.5 mg Oral BID   citalopram  40 mg Oral Daily   feeding supplement  237 mL Oral TID BM   gabapentin  100 mg Oral TID   levothyroxine  75 mcg Oral Q0600   multivitamin with minerals  1 tablet Oral Daily   senna-docusate  1 tablet Oral BID   Continuous Infusions:  dextrose 5 % and 0.9% NaCl 75 mL/hr at 10/14/22 0936     LOS: 2 days    Time spent:40 min    Lynzee Lindquist, Geraldo Docker, MD [facility]   If 7PM-7AM, please contact night-coverage 10/14/2022, 12:53 PM

## 2022-10-15 ENCOUNTER — Inpatient Hospital Stay (HOSPITAL_COMMUNITY): Payer: BC Managed Care – PPO

## 2022-10-15 DIAGNOSIS — Z7189 Other specified counseling: Secondary | ICD-10-CM

## 2022-10-15 DIAGNOSIS — C50312 Malignant neoplasm of lower-inner quadrant of left female breast: Secondary | ICD-10-CM | POA: Diagnosis not present

## 2022-10-15 DIAGNOSIS — C7951 Secondary malignant neoplasm of bone: Secondary | ICD-10-CM | POA: Diagnosis not present

## 2022-10-15 DIAGNOSIS — R4182 Altered mental status, unspecified: Secondary | ICD-10-CM | POA: Diagnosis not present

## 2022-10-15 DIAGNOSIS — Z66 Do not resuscitate: Secondary | ICD-10-CM

## 2022-10-15 DIAGNOSIS — Z515 Encounter for palliative care: Secondary | ICD-10-CM

## 2022-10-15 DIAGNOSIS — Z0189 Encounter for other specified special examinations: Secondary | ICD-10-CM | POA: Diagnosis not present

## 2022-10-15 DIAGNOSIS — R443 Hallucinations, unspecified: Secondary | ICD-10-CM | POA: Diagnosis not present

## 2022-10-15 LAB — ECHOCARDIOGRAM COMPLETE
Area-P 1/2: 3.23 cm2
Calc EF: 61.4 %
Height: 62 in
S' Lateral: 2.2 cm
Single Plane A2C EF: 63.7 %
Single Plane A4C EF: 60.1 %
Weight: 1421.53 oz

## 2022-10-15 LAB — CBC WITH DIFFERENTIAL/PLATELET
Abs Immature Granulocytes: 0.1 10*3/uL — ABNORMAL HIGH (ref 0.00–0.07)
Basophils Absolute: 0.1 10*3/uL (ref 0.0–0.1)
Basophils Relative: 1 %
Eosinophils Absolute: 0 10*3/uL (ref 0.0–0.5)
Eosinophils Relative: 0 %
HCT: 33.3 % — ABNORMAL LOW (ref 36.0–46.0)
Hemoglobin: 11 g/dL — ABNORMAL LOW (ref 12.0–15.0)
Lymphocytes Relative: 4 %
Lymphs Abs: 0.3 10*3/uL — ABNORMAL LOW (ref 0.7–4.0)
MCH: 31.5 pg (ref 26.0–34.0)
MCHC: 33 g/dL (ref 30.0–36.0)
MCV: 95.4 fL (ref 80.0–100.0)
Monocytes Absolute: 0.3 10*3/uL (ref 0.1–1.0)
Monocytes Relative: 5 %
Myelocytes: 1 %
Neutro Abs: 6.1 10*3/uL (ref 1.7–7.7)
Neutrophils Relative %: 89 %
Platelets: 129 10*3/uL — ABNORMAL LOW (ref 150–400)
RBC: 3.49 MIL/uL — ABNORMAL LOW (ref 3.87–5.11)
RDW: 14.8 % (ref 11.5–15.5)
WBC: 6.9 10*3/uL (ref 4.0–10.5)
nRBC: 0 % (ref 0.0–0.2)

## 2022-10-15 LAB — COMPREHENSIVE METABOLIC PANEL
ALT: 31 U/L (ref 0–44)
AST: 93 U/L — ABNORMAL HIGH (ref 15–41)
Albumin: 3.4 g/dL — ABNORMAL LOW (ref 3.5–5.0)
Alkaline Phosphatase: 859 U/L — ABNORMAL HIGH (ref 38–126)
Anion gap: 9 (ref 5–15)
BUN: 11 mg/dL (ref 8–23)
CO2: 20 mmol/L — ABNORMAL LOW (ref 22–32)
Calcium: 8.2 mg/dL — ABNORMAL LOW (ref 8.9–10.3)
Chloride: 106 mmol/L (ref 98–111)
Creatinine, Ser: 0.34 mg/dL — ABNORMAL LOW (ref 0.44–1.00)
GFR, Estimated: >60 mL/min (ref >60)
Glucose, Bld: 127 mg/dL — ABNORMAL HIGH (ref 70–99)
Potassium: 3.3 mmol/L — ABNORMAL LOW (ref 3.5–5.1)
Sodium: 135 mmol/L (ref 135–145)
Total Bilirubin: 0.7 mg/dL (ref 0.3–1.2)
Total Protein: 5.8 g/dL — ABNORMAL LOW (ref 6.5–8.1)

## 2022-10-15 LAB — MAGNESIUM: Magnesium: 1.9 mg/dL (ref 1.7–2.4)

## 2022-10-15 LAB — PHOSPHORUS: Phosphorus: 1.7 mg/dL — ABNORMAL LOW (ref 2.5–4.6)

## 2022-10-15 MED ORDER — MAGNESIUM SULFATE 4 GM/100ML IV SOLN
4.0000 g | Freq: Once | INTRAVENOUS | Status: AC
  Administered 2022-10-15: 4 g via INTRAVENOUS
  Filled 2022-10-15: qty 100

## 2022-10-15 MED ORDER — POTASSIUM CHLORIDE 10 MEQ/100ML IV SOLN
10.0000 meq | INTRAVENOUS | Status: AC
  Administered 2022-10-15 – 2022-10-16 (×5): 10 meq via INTRAVENOUS
  Filled 2022-10-15 (×5): qty 100

## 2022-10-15 MED ORDER — POTASSIUM PHOSPHATES 15 MMOLE/5ML IV SOLN
30.0000 mmol | Freq: Once | INTRAVENOUS | Status: AC
  Administered 2022-10-15: 30 mmol via INTRAVENOUS
  Filled 2022-10-15: qty 10

## 2022-10-15 NOTE — Consult Note (Signed)
Palliative Care Consult Note                                  Date: 10/15/2022   Patient Name: Martha [name]  DOB: 06-Nov-1960  MRN: [id number]  Age / Sex: 62 y.o., female  PCP: [name], MD Referring Physician: Allie Bossier, MD  Reason for Consultation: Establishing goals of care  HPI/Patient Profile: Palliative Care consult requested for goals of care discussion in this 62 y.o. female  with past medical history of ER+ breast cancer (01/2018), right breast cancer (04/2021), now with metastatic disease progression involving brain and liver, pathological fractures with osseous mets s/p brain radiation. She was admitted on 10/12/2022 from home with confusion and weakness. Recently discharged from [place] on 1/21 after cervical spine surgery.   Past Medical History:  Diagnosis Date   Anxiety    Cancer ([place]) 2022   right breast LCIS   Cancer ([place]) 2019   left breast   Depression    Dysrhythmia    SVT, s/p ablation ~ 2012 in at [facility]   Ectopic pregnancy    Family history of breast cancer    Family history of melanoma    History of radiation therapy 04/22/18-06/03/18   Left Breast, left SCV, axilla 50 Gy in 25 fractions, Left breast boost 10 Gy in 5 fractions.    Hypothyroidism    Personal history of radiation therapy    PONV (postoperative nausea and vomiting)    Thyroid disease      Subjective:   This NP Osborne Oman reviewed medical records, received report from team, assessed the patient and then met at the patient's bedside with Mrs. Haufe and her husband, [name] to discuss diagnosis, prognosis, [employer], EOL wishes disposition and options.  Patient is awake and alert. Sitting upright in bed [place] style watching TV. No acute distress. Alert and able to engage appropriately in discussions.    Concept of Palliative Care was introduced as specialized medical care for people and their families living with  serious illness.  It focuses on providing relief from the symptoms and stress of a serious illness.  The goal is to improve quality of life for both the patient and the family. Values and goals of care important to patient and family were attempted to be elicited.  I created space and opportunity for patient and family to explore state of health prior to admission, thoughts, and feelings.   Mrs. Burgoon lives at home with her husband of 6 years. No children. Dog name Daisy. She worked for many years in [employer].   Shares recent months have been challenging due to spinal surgery and inpatient rehab. Prior to her surgery she attempted dry needling and deep tissue massaging for her pain and discomfort. Ambulatory with a walker. Able to perform most ADLs independently however not without frequent rest breaks due to fatigue and pain. Appetite has significantly decreased. Reports 30-40lb weight loss over the past 6-8 months.   We discussed Her current illness and what it means in the larger context of Her on-going co-morbidities. Natural disease trajectory and expectations were discussed.  [name] and her husband verbalized understanding of current illness and co-morbidities. She is able to discuss her health history appropriate reviewing timeline of illness. They are realistic in their understanding and palliative natured treatment options. Patient is clear in expressed wishes that after discussions with  her husband and family would like to attempt to treat the treatable allowing her every opportunity to thrive. She does not want to suffer. Husband shares if pain is managed patient is more motivated.   She wishes to take things one day at a time. Knows at some point treatment options will not be available or limited but is hopeful she can have PAC placed while hospitalized and get to the cancer center to start treatments later this week.   We discussed at length  her decreased appetite. She feels this is somewhat improving however she is "picky" in her food choices. Drinking 2-3 Ensure daily. When at home she makes her own protein smoothies. Her sister-in-law prepared one for her today and she has at the bedside. [name] complains of throat soreness due to recent neck radiation. Unable to swallow large chunks of food. We discussed focusing on small frequent meals and soft foods.   [name] complains of pain in hr legs, back, neck, shoulders, and rib areas. Feels this has improved. Unfortunately on admission she was dealing with confusion which is believed to be medication induced. Her gabapentin has been decreased to '100mg'$  three times daily. She is tolerating without difficulty. Hydrocodone and Flexeril as needed. She endorses anxiety due to fear of unknown and future. Tolerating Celexa and ativan.   Education provided on use of medications. She and husband understands we will not make adjustments at this time given symptoms are adequately controlled.   I discussed the importance of continued conversation with family and their medical providers regarding overall plan of care and treatment options, ensuring decisions are within the context of the patients values and GOCs.  Questions and concerns were addressed.  Patient and husband was encouraged to call with questions or concerns.  PMT will continue to support holistically as needed.  Objective:   Primary Diagnoses: Present on Admission:  AMS (altered mental status)  Hallucination  Cervicalgia  Cancer, metastatic to bone ([name])  Change in mental status   Scheduled Meds:  acidophilus  1 capsule Oral Daily   apixaban  2.5 mg Oral BID   citalopram  40 mg Oral Daily   feeding supplement  237 mL Oral TID BM   gabapentin  100 mg Oral TID   levothyroxine  75 mcg Oral Q0600   multivitamin with minerals  1 tablet Oral Daily   senna-docusate  1 tablet Oral BID    Continuous Infusions:  dextrose 5 % and 0.9% NaCl  Stopped (10/14/22 1408)    PRN Meds: acetaminophen **OR** acetaminophen, cyclobenzaprine, HYDROcodone-acetaminophen, lidocaine, LORazepam, ondansetron **OR** ondansetron (ZOFRAN) IV, mouth rinse, polyethylene glycol, traZODone  Allergies  Allergen Reactions   Morphine And Related Other (See Comments)    "Makes her angry"    Review of Systems  Constitutional:  Positive for activity change, appetite change and fatigue.  HENT:  Positive for sore throat.   Musculoskeletal:  Positive for back pain.  Neurological:  Positive for weakness.  Unless otherwise noted, a complete review of systems is negative.  Physical Exam General: NAD, cachectic, chronically-ill appearing Cardiovascular: regular rate and rhythm Pulmonary:  diminished bilaterally  Abdomen: soft, nontender, + bowel sounds Extremities: no edema, no joint deformities Skin: no rashes, warm and dry Neurological: A&O x4, mood appropriate   Vital Signs:  BP 119/75 (BP Location: Right Arm)   Pulse 96   Temp 98.4 F (36.9 C) (Oral)   Resp 18   Ht '5\' 2"'$  (1.575 m)   Wt 40.3 kg  SpO2 94%   BMI 16.25 kg/m  Pain Scale: 0-10 POSS *See Group Information*: 1-Acceptable,Awake and alert Pain Score: 0-No pain  SpO2: SpO2: 94 % O2 Device:SpO2: 94 % O2 Flow Rate: .O2 Flow Rate (L/min): 2 L/min  IO: Intake/output summary:  Intake/Output Summary (Last 24 hours) at 10/15/2022 1209 Last data filed at 10/15/2022 1100 Gross per 24 hour  Intake 2516.83 ml  Output --  Net 2516.83 ml    LBM: Last BM Date : 10/15/22 Baseline Weight: Weight: 40.3 kg Most recent weight: Weight: 40.3 kg      Palliative Assessment/Data:    Advanced Care Planning:   Primary Decision Maker: PATIENT and husband if unable to speak for herself   Code Status/Advance Care Planning: DNR  A discussion was had today regarding advanced directives. Concepts specific to code status, artifical feeding and hydration, continued IV antibiotics and  rehospitalization was had.  The difference between a aggressive medical intervention path and a palliative comfort care path was discussed.   [name] and her husband confirms wishes for DNR/DNI.  Hospice and Palliative Care services outpatient were explained and offered. Patient and family verbalized their understanding and awareness of both palliative and hospice's goals and philosophy of care. She is not ready to discuss or accept hospice support as her goal is to continue with aggressive treatment with hopes of starting her chemotherapy treatments this upcoming week. Patient and husband aware I will continue to support while hospitalized and also support at [facility] in collaboration with Dr. Burr Medico. We will plan for follow-up at discharge.   Assessment & Plan:   SUMMARY OF RECOMMENDATIONS   DNR/DNI-as confirmed by patient and husband Continue with current plan of care. Patient and husband clear in expressed wishes to continue to treat the treatable aggressively. They are realistic in their understanding of current disease progression and poor long-term prognosis. Hopeful for the ability to continue to thrive and start chemotherapy.  PAC scheduled to be placed this week.  [name] confirms husband as primary decision maker in the event she is unable to speak for herself.  PMT will continue to support and follow as needed. Please call team line with urgent needs.  Symptom Management:  Neoplasm related pain Hydrocodone 5/325 mg as needed Flexeril '10mg'$  as needed for muscle spasms Gabapentin '100mg'$  three times daily Lidocaine mouth solution for mouth pain/sore throat Anxiety/Depression Ativan as needed  Celexa 40 mg daily Constipation Miralax as needed Senna twice daily Nausea Zofran as needed   Palliative Prophylaxis:  Aspiration, Bowel Regimen, Delirium Protocol, Frequent Pain Assessment, and Oral Care  Additional Recommendations (Limitations, Scope, Preferences): Continue to treat the  treatable, DNR/DNI  Psycho-social/Spiritual:  Desire for further Chaplaincy support: no Additional Recommendations:  Ongoing goals of care discussions   Prognosis:  Guarded-Poor   Discharge Planning:  To Be Determined Outpatient palliative support at [facility]. Appointment to be scheduled at discharge.   Discussed with: Dr. Sherral Hammers  Patient and her husband expressed understanding and was in agreement with this plan.   Time Total: 70 min   Visit consisted of counseling and education dealing with the complex and emotionally intense issues of symptom management and palliative care in the setting of serious and potentially life-threatening illness.Greater than 50%  of this time was spent counseling and coordinating care related to the above assessment and plan.  Signed by:  Alda Lea, AGPCNP-BC [place]   Phone: [phone] Pager: [phone] Amion: Bjorn Pippin   Thank you for  allowing the Palliative Medicine Team to assist in the care of this patient. Please utilize secure chat with additional questions, if there is no response within 30 minutes please call the above phone number. Palliative Medicine Team providers are available by phone from 7am to 5pm daily and can be reached through the team cell phone.  Should this patient require assistance outside of these hours, please call the patient's attending physician.

## 2022-10-15 NOTE — Progress Notes (Signed)
PROGRESS NOTE    Martha [name]  [id number] DOB: 1960/12/31 DOA: 10/12/2022 PCP: [name], MD     Brief Narrative:   62 y.o. WF PMHx anxiety, depression, dysrhythmia, ER positive breast cancer diagnosed 12/2017 with metastasis to cervical spine and brain discovered in July 2023   Admitted to the hospital for evaluation and management of confusion and weakness.  The patient was recently discharged from [facility] health inpatient rehab on 1/31.  She is currently undergoing radiation to her brain.  She has some chronic dizziness, and sensation of falling out of the bed, husband states that this has been going on for several weeks especially after her more recent cervical spine surgery in January 2024.  This problem seems to come and go, has been worse in the last 2 to 3 days.  Patient's husband also states that she has been somewhat confused and having some hallucinations, for example seeing people in her room and even this morning here in the hospital who are not present.  She has never really had this problem before, started in the last 3 to 4 days.  Per the patient and her husband, it seems that this confusion is somewhat transient, does not last very long.  While she was in rehab, her Celexa dose was increased to 40 mg, her gabapentin was titrated up to 200 mg p.o. 3 times daily.  There have been no other significant medication changes, and there has not been any medication changes since discharge from rehab.  Overall patient and her husband feel that she has been doing well considering what she has been through, she continues to ambulate with a walker, she is supposed to start outpatient therapy soon.   ED Course: Husband called EMS and the patient was brought to the emergency department for evaluation, where she has unremarkable vital signs and is stable on room air.  So far lab work reveals normal white blood cell count, normal hemoglobin, platelets 128.  Sodium 132, potassium 3.4.   Renal function is normal.  Calcium 12.7.  Lactate is normal at 1.4.   Subjective: 2/18 alert, positive dysphagia, some agitation.  Husband at bedside.   Assessment & Plan: Covid vaccination;   Principal Problem:   Hallucination Active Problems:   Malignant neoplasm of lower-inner quadrant of left breast in female, estrogen receptor positive ([place])   Cervicalgia   Cancer, metastatic to bone ([place])   Change in mental status   AMS (altered mental status)   Depression   Cancer associated pain  Malignant neoplasm of lower-inner quadrant of LEFT breast, ER positive  -Patient sees Dr. Burr Medico oncologist and Dr. Eppie Gibson radiation oncologist -Dx 12/2017 - S/p LEFT breast lumpectomy and adjuvant XRT - Letrozole started 05/2018 - lost f/u after visit in 04/2020 until her recurrence in 07/2022     Cancer, metastatic to bone  -07/24/2022 MRI C-spine C-spine mets see below extensive osseous metastatic disease with pathologic fracture at the base of dens and C2 right lateral mass. Extraosseous tumor at C1 and C2 likely impinging on the right C2 and C3 nerve roots.  -08/03/2022 PET scan diffuse bone mets -08/17/2022 cervical laminectomy and fusion by Dr. Marcello Moores biopsy confirmed metastatic breast cancer, ER/PR negative, HER2 positive. -08/17/2022 due to cervical cord compression with myelopathy, she underwent a second cervical spine surgery on September 06, 2022 -S/p brain XRT -S/p chemotherapy -2/16 per Dr.Feng oncology note from 2/15 treatment plan is as follows  -lab and brain MRI reviewed, unremarkable except calcium  is elevated 12.7 today, which can contributed to her confusion. -will give her a doze of zometa 60m  -Her gabapentin has been dose reduced, she is tolerating Norco well,  -Urine and blood culture are pending -Patient is scheduled to start chemotherapy next week.   -2/16 Discussed case with Dr. SEppie Gibsonradiation oncologist revised XRT plan.  Now will only receive 1 dose of  L-spine XRT today. -2/17 completed XRT treatment x 1 states back pain 3/10 today -2/18 has agreed to have port placed, and began chemotherapy on Thursday with Dr.Feng oncology. - 2/18 attempt to have IR placed port in the A.m. here.  If not we will need to arrange transport for [facility] to [facility](has appointment on Tuesday at 1400)  Altered mental status - Most likely multifactorial, brain mets, pain management medication, probably severe protein calorie malnutrition, possible infection. - 2/15 blood NGTD -2/15 urine negative -2/16 patient continues to have waxing and waning hallucinations (sees her nephew) -2/16 ammonia, TSH WNL -2/16 will consult PT/OT in a.m. -2/16 decrease Gabapentin 100 mg TID -2/17 patient appears to be A/O x 4 but having episodes of expressive aphasia -2/18 patient cognition appears worse in the day, but per husband recently received Ativan for agitation..  Depression - Celexa  Hypokalemia - Potassium goal> 4 - 2/16 K-Phos 30 mmol - 2/16 K-Dur 40 mEq -2/17 K-Phos 30 mmol - 2/17 K-Dur 40 mEq --2/18 K-Phos 30 mmol --2/18 Potassium 563m  Hypomagnesmia - Magnesium goal> 2 - 2/17 MagnesiumIV 2 g --2/18 Magnesium IV 3 g  Phosphatemia - Phosphorus goal> -2/18 see HypoKalemia   Severe protein calorie malnutrition -2/16 D5-0.9% saline [id number] - 2/16 nutrition consult   Goals of care - 2/17 Palliative Care consult: Patient and family would like to speak with palliative care concerning options.      Mobility Assessment (last 72 hours)     Mobility Assessment     Row Name 10/15/22 1100 10/14/22 2010 10/14/22 1238 10/13/22 1050 10/13/22 0930   Does patient have an order for bedrest or is patient medically unstable No - Continue assessment No - Continue assessment -- -- No - Continue assessment   What is the highest level of mobility based on the progressive mobility assessment? Level 4 (Walks with assist in room) - Balance while marching in place  and cannot step forward and back - Complete Level 4 (Walks with assist in room) - Balance while marching in place and cannot step forward and back - Complete Level 4 (Walks with assist in room) - Balance while marching in place and cannot step forward and back - Complete Level 3 (Stands with assist) - Balance while standing  and cannot march in place Level 5 (Walks with assist in room/hall) - Balance while stepping forward/back and can walk in room with assist - Complete   Is the above level different from baseline mobility prior to current illness? Yes - Recommend PT order Yes - Recommend PT order -- -- Yes - Recommend PT order    Row[place]me 10/12/22 2125           Does patient have an order for bedrest or is patient medically unstable No - Continue assessment       What is the highest level of mobility based on the progressive mobility assessment? Level 2 (Chairfast) - Balance while sitting on edge of bed and cannot stand       Is the above level different from baseline mobility prior to current illness?  Yes - Recommend PT order                      DVT prophylaxis: Eliquis Code Status: DNR Family Communication: 2/18 [name] (husband), and sister at bedside discussed plan of care all questions answered.   Status is: Inpatient    Dispo: The patient is from: Home              Anticipated d/c is to: SNF??              Anticipated d/c date is: 3 days              Patient currently is not medically stable to d/c.      Consultants:  Dr. Eppie Gibson radiation oncology   Procedures/Significant Events:  07/24/2022 MRI C-spine  extensive osseous metastatic disease with pathologic fracture at the base of dens and C2 right lateral mass. Extraosseous tumor at C1 and C2 likely impinging on the right C2 and C3 nerve roots. 09/13/2022 MRI brain W/W0 contrast; 22 enhancing lesions are identified in the current study, annotated on series 1100. New lesions are marked with double arrows.    New lesions:   Right cerebellar hemisphere, 1 mm, image 122.   Left cerebellar hemisphere, 1 mm, image 139   Enlarged lesions:   Left cerebellar floccule, 12 mm (10 mm on prior), image 127   Right cerebellar hemisphere, 5 mm (3 mm on prior), image 122   Right occipital lobe: 4 mm (3 mm on prior), image 146   Left occipital lobe, 3 mm (2 mm on prior), image 159   Left occipital lobe, 7 mm (5 mm on prior), image 161   Left frontal operculum, 2 mm (1 mm on prior) image 172   Left occipital lobe, 2 mm (1 mm on prior), image 173   Right subinsular region, 5 mm (3 mm on prior) image 176   Right posterior temporal, 3 mm) 1 mm on prior), image 177   Left anterior cingulate, 3 mm (2 mm on prior), image 193   Left posterior frontal, 2.5 mm (1.5 mm on prior) image 195   Right parietal, 4 mm (3 mm on prior), image 196   Left parietal, 1.5 mm (less than 1 mm on prior), image 200   Left frontal lobe, 2 mm (less than 1 mm on prior), image 203   Left parietal lobe, 2 mm (less than 1 mm on prior), image 213   Right frontal lobe, 1.5 mm (less than 1 mm on prior), image 222   Unchanged lesions:   Right cerebellar hemisphere, 1 mm, image 122   Right temporal lobe, 3 mm, image 145   Left posterior temporal, 3 mm, image 184   Left periventricular, 2 mm, image 192   Mild surrounding edema is seen associated with the the 7 mm medial left occipital lobe lesion. No significant vasogenic edema associated with the other lesions. 2/16 MRI Brain wo contrast;The lesion at the left CP angle is grossly similar. Additional enhancing lesions seen on the prior cannot be accurately assessed without contrast. Consider postcontrast imaging. 2. No significant new mass effect or other obvious acute abnormality. 3. Osseous metastatic disease.  I have personally reviewed and interpreted all radiology studies and my findings are as above.  VENTILATOR  SETTINGS:    Cultures   Antimicrobials:    Devices    LINES / TUBES:      Continuous Infusions:  dextrose 5 % and  0.9% NaCl Stopped (10/14/22 1408)     Objective: Vitals:   10/14/22 0525 10/14/22 1442 10/15/22 0007 10/15/22 0341  BP: 109/64 118/68 (!) 129/98 119/75  Pulse: 89 96 (!) 102 96  Resp: 19  14 18  $ Temp: 98 F (36.7 C) 97.8 F (36.6 C) 97.7 F (36.5 C) 98.4 F (36.9 C)  TempSrc: Oral Oral Oral Oral  SpO2: 100% 93% (!) 89% 94%  Weight:      Height:        Intake/Output Summary (Last 24 hours) at 10/15/2022 1602 Last data filed at 10/15/2022 1100 Gross per 24 hour  Intake 2516.83 ml  Output --  Net 2516.83 ml    Filed Weights   10/12/22 0603  Weight: 40.3 kg    Physical Exam:  General: Alert but confused, No acute respiratory distress Eyes: negative scleral hemorrhage, negative anisocoria, negative icterus ENT: Negative Runny nose, negative gingival bleeding, Neck:  Negative scars, masses, torticollis, lymphadenopathy, JVD Lungs: Clear to auscultation bilaterally without wheezes or crackles Cardiovascular: Regular rate and rhythm without murmur gallop or rub normal S1 and S2 Abdomen: negative abdominal pain, nondistended, positive soft, bowel sounds, no rebound, no ascites, no appreciable mass Extremities: No significant cyanosis, clubbing, or edema bilateral lower extremities Skin: Negative rashes, lesions, ulcers Psychiatric: Confused today (just received Ativan) Central nervous system:  Cranial nerves II through XII intact, tongue/uvula midline, all extremities muscle strength 5/5, sensation intact throughout, negative dysarthria, positive expressive aphasia, negative receptive aphasia.  (Just received Ativan)  .     Data Reviewed: Care during the described time interval was provided by me .  I have reviewed this patient's available data, including medical history, events of note, physical examination, and all test results as part of my  evaluation.  CBC: Recent Labs  Lab 10/12/22 0445 10/13/22 0628 10/14/22 0606 10/15/22 0632  WBC 8.4 7.4 5.7 6.9  NEUTROABS 7.1 5.8 4.9 6.1  HGB 12.6 12.3 10.7* 11.0*  HCT 36.9 36.6 31.5* 33.3*  MCV 92.7 93.4 94.0 95.4  PLT 128* 135* 117* 129*    Basic Metabolic Panel: Recent Labs  Lab 10/12/22 0445 10/13/22 0600 10/13/22 0628 10/14/22 0606 10/15/22 0632  NA 132*  --  135 138 135  K 3.4*  --  2.7* 3.3* 3.3*  CL 93*  --  97* 104 106  CO2 25  --  25 23 20*  GLUCOSE 104*  --  116* 123* 127*  BUN 23  --  18 20 11  $ CREATININE 0.50  --  0.62 0.57 0.34*  CALCIUM 12.7*  --  12.2* 10.1 8.2*  MG  --  2.3  --  1.8 1.9  PHOS  --  2.6  --  1.9* 1.7*    GFR: Estimated Creatinine Clearance: 47 mL/min (A) (by C-G formula based on SCr of 0.34 mg/dL (L)). Liver Function Tests: Recent Labs  Lab 10/12/22 0445 10/14/22 0606 10/15/22 0632  AST 102* 94* 93*  ALT 29 28 31  $ ALKPHOS 723* 737* 859*  BILITOT 1.2 0.8 0.7  PROT 6.7 5.8* 5.8*  ALBUMIN 3.8 3.4* 3.4*    No results for input(s): "LIPASE", "AMYLASE" in the last 168 hours. Recent Labs  Lab 10/12/22 0652  AMMONIA 23    Coagulation Profile: Recent Labs  Lab 10/12/22 0652  INR 1.3*    Cardiac Enzymes: No results for input(s): "CKTOTAL", "CKMB", "CKMBINDEX", "TROPONINI" in the last 168 hours. BNP (last 3 results) No results for input(s): "PROBNP" in the last 8760 hours. HbA1C:  No results for input(s): "HGBA1C" in the last 72 hours. CBG: No results for input(s): "GLUCAP" in the last 168 hours. Lipid Profile: No results for input(s): "CHOL", "HDL", "LDLCALC", "TRIG", "CHOLHDL", "LDLDIRECT" in the last 72 hours. Thyroid Function Tests: No results for input(s): "TSH", "T4TOTAL", "FREET4", "T3FREE", "THYROIDAB" in the last 72 hours.  Anemia Panel: No results for input(s): "VITAMINB12", "FOLATE", "FERRITIN", "TIBC", "IRON", "RETICCTPCT" in the last 72 hours. Sepsis Labs: Recent Labs  Lab 10/12/22 0445  10/12/22 0652  PROCALCITON  --  0.10  LATICACIDVEN 1.4  --      Recent Results (from the past 240 hour(s))  Resp panel by RT-PCR (RSV, Flu A&B, Covid) Anterior Nasal Swab     Status: None   Collection Time: 10/12/22  4:35 AM   Specimen: Anterior Nasal Swab  Result Value Ref Range Status   SARS Coronavirus 2 by RT PCR NEGATIVE NEGATIVE Final    Comment: (NOTE) SARS-CoV-2 target nucleic acids are NOT DETECTED.  The SARS-CoV-2 RNA is generally detectable in upper respiratory specimens during the acute phase of infection. The lowest concentration of SARS-CoV-2 viral copies this assay can detect is 138 copies/mL. A negative result does not preclude SARS-Cov-2 infection and should not be used as the sole basis for treatment or other patient management decisions. A negative result may occur with  improper specimen collection/handling, submission of specimen other than nasopharyngeal swab, presence of viral mutation(s) within the areas targeted by this assay, and inadequate number of viral copies(<138 copies/mL). A negative result must be combined with clinical observations, patient history, and epidemiological information. The expected result is Negative.  Fact Sheet for Patients:  EntrepreneurPulse.com.au  Fact Sheet for Healthcare Providers:  IncredibleEmployment.be  This test is no t yet approved or cleared by the [place] FDA and  has been authorized for detection and/or diagnosis of SARS-CoV-2 by FDA under an Emergency Use Authorization (EUA). This EUA will remain  in effect (meaning this test can be used) for the duration of the COVID-19 declaration under Section 564(b)(1) of the Act, 21 U.S.C.section 360bbb-3(b)(1), unless the authorization is terminated  or revoked sooner.       Influenza A by PCR NEGATIVE NEGATIVE Final   Influenza B by PCR NEGATIVE NEGATIVE Final    Comment: (NOTE) The Xpert Xpress SARS-CoV-2/FLU/RSV plus  assay is intended as an aid in the diagnosis of influenza from Nasopharyngeal swab specimens and should not be used as a sole basis for treatment. Nasal washings and aspirates are unacceptable for Xpert Xpress SARS-CoV-2/FLU/RSV testing.  Fact Sheet for Patients: EntrepreneurPulse.com.au  Fact Sheet for Healthcare Providers: IncredibleEmployment.be  This test is not yet approved or cleared by the [place] FDA and has been authorized for detection and/or diagnosis of SARS-CoV-2 by FDA under an Emergency Use Authorization (EUA). This EUA will remain in effect (meaning this test can be used) for the duration of the COVID-19 declaration under Section 564(b)(1) of the Act, 21 U.S.C. section 360bbb-3(b)(1), unless the authorization is terminated or revoked.     Resp Syncytial Virus by PCR NEGATIVE NEGATIVE Final    Comment: (NOTE) Fact Sheet for Patients: EntrepreneurPulse.com.au  Fact Sheet for Healthcare Providers: IncredibleEmployment.be  This test is not yet approved or cleared by the [place] FDA and has been authorized for detection and/or diagnosis of SARS-CoV-2 by FDA under an Emergency Use Authorization (EUA). This EUA will remain in effect (meaning this test can be used) for the duration of the COVID-19 declaration under Section 564(b)(1) of the  Act, 21 U.S.C. section 360bbb-3(b)(1), unless the authorization is terminated or revoked.  Performed at [facility], [place] [address], [place], [place] [address]   Blood Culture (routine x 2)     Status: None (Preliminary result)   Collection Time: 10/12/22  4:35 AM   Specimen: BLOOD  Result Value Ref Range Status   Specimen Description   Final    BLOOD LEFT ANTECUBITAL Performed at [facility] [address], [place], [place] [address]    Special Requests   Final    BOTTLES DRAWN AEROBIC AND ANAEROBIC  Blood Culture adequate volume Performed at [facility] [address], [place], [place] [address]    Culture   Final    NO GROWTH 3 DAYS Performed at [facility], [place] [address], [place], [place] [address]    Report Status PENDING  Incomplete  Blood Culture (routine x 2)     Status: None (Preliminary result)   Collection Time: 10/12/22  4:45 AM   Specimen: BLOOD  Result Value Ref Range Status   Specimen Description   Final    BLOOD RIGHT ANTECUBITAL Performed at [facility] [address] [place], [place] [address]    Special Requests   Final    BOTTLES DRAWN AEROBIC AND ANAEROBIC Blood Culture results may not be optimal due to an excessive volume of blood received in culture bottles Performed at [facility] [address] [place], [place] [address]    Culture   Final    NO GROWTH 3 DAYS Performed at [facility], [place] [address], [place], [place] [address]    Report Status PENDING  Incomplete  Urine Culture (for pregnant, neutropenic or urologic patients or patients with an indwelling urinary catheter)     Status: None   Collection Time: 10/12/22  6:43 AM   Specimen: Urine, Clean Catch  Result Value Ref Range Status   Specimen Description   Final    URINE, CLEAN CATCH Performed at [facility], [place] [address], [place], [place] [address]    Special Requests   Final    NONE Performed at [facility], [place] [address], [place], [place] [address]    Culture   Final    NO GROWTH Performed at [facility], [place] [address] [place], [place] [address]    Report Status 10/13/2022 FINAL  Final         Radiology Studies: ECHOCARDIOGRAM COMPLETE  Result Date: 10/15/2022    ECHOCARDIOGRAM REPORT   Patient Name:   TARINI INACIO [facility] Date of Exam: 10/15/2022 Medical Rec #:  [id number]             Height:       62.0 in Accession #:    [id number]            Weight:        88.8 lb Date of Birth:  08-27-1961             BSA:          1.354 m Patient Age:    24 years              BP:           119/75 mmHg Patient Gender: F                     HR:  90 bpm. Exam Location:  Inpatient Procedure: 2D Echo, 3D Echo, Cardiac Doppler, Color Doppler and Strain Analysis Indications:    chemo  History:        Patient has no prior history of Echocardiogram examinations.  Sonographer:    Phineas Douglas Referring Phys: [id number]  1. Left ventricular ejection fraction, by estimation, is 55 to 60%. The left ventricle has normal function. The left ventricle has no regional wall motion abnormalities. Left ventricular diastolic parameters are consistent with Grade I diastolic dysfunction (impaired relaxation). The average left ventricular global longitudinal strain is -14.9 %. The global longitudinal strain is abnormal.  2. Right ventricular systolic function is normal. The right ventricular size is normal. There is mildly elevated pulmonary artery systolic pressure.  3. The mitral valve is normal in structure. Trivial mitral valve regurgitation. No evidence of mitral stenosis.  4. The aortic valve is tricuspid. Aortic valve regurgitation is not visualized. No aortic stenosis is present.  5. The inferior vena cava is normal in size with <50% respiratory variability, suggesting right atrial pressure of 8 mmHg. FINDINGS  Left Ventricle: Left ventricular ejection fraction, by estimation, is 55 to 60%. The left ventricle has normal function. The left ventricle has no regional wall motion abnormalities. The average left ventricular global longitudinal strain is -14.9 %. The global longitudinal strain is abnormal. The left ventricular internal cavity size was normal in size. There is no left ventricular hypertrophy. Left ventricular diastolic parameters are consistent with Grade I diastolic dysfunction (impaired relaxation). Indeterminate filling pressures. Right Ventricle:  The right ventricular size is normal. No increase in right ventricular wall thickness. Right ventricular systolic function is normal. There is mildly elevated pulmonary artery systolic pressure. The tricuspid regurgitant velocity is 2.67  m/s, and with an assumed right atrial pressure of 8 mmHg, the estimated right ventricular systolic pressure is [id number] mmHg. Left Atrium: Left atrial size was normal in size. Right Atrium: Right atrial size was normal in size. Pericardium: There is no evidence of pericardial effusion. Mitral Valve: The mitral valve is normal in structure. Trivial mitral valve regurgitation. No evidence of mitral valve stenosis. Tricuspid Valve: The tricuspid valve is normal in structure. Tricuspid valve regurgitation is trivial. No evidence of tricuspid stenosis. Aortic Valve: The aortic valve is tricuspid. Aortic valve regurgitation is not visualized. No aortic stenosis is present. Pulmonic Valve: The pulmonic valve was normal in structure. Pulmonic valve regurgitation is not visualized. No evidence of pulmonic stenosis. Aorta: The aortic root is normal in size and structure. Venous: The inferior vena cava is normal in size with less than 50% respiratory variability, suggesting right atrial pressure of 8 mmHg. IAS/Shunts: No atrial level shunt detected by color flow Doppler. Additional Comments: There is a small pleural effusion in the left lateral region.  LEFT VENTRICLE PLAX 2D LVIDd:         3.50 cm     Diastology LVIDs:         2.20 cm     LV e' medial:    6.31 cm/s LV PW:         1.10 cm     LV E/e' medial:  11.9 LV IVS:        1.00 cm     LV e' lateral:   9.68 cm/s LVOT diam:     1.80 cm     LV E/e' lateral: 7.7 LV SV:         46 LV SV Index:   34  2D Longitudinal Strain LVOT Area:     2.54 cm    2D Strain GLS (A2C):   -12.7 %                            2D Strain GLS (A3C):   -17.5 %                            2D Strain GLS (A4C):   -14.6 % LV Volumes (MOD)           2D Strain GLS Avg:      -14.9 % LV vol d, MOD A2C: 68.3 ml LV vol d, MOD A4C: 79.9 ml LV vol s, MOD A2C: 24.8 ml LV vol s, MOD A4C: 31.9 ml 3D Volume EF: LV SV MOD A2C:     43.5 ml 3D EF:        64 % LV SV MOD A4C:     79.9 ml LV EDV:       113 ml LV SV MOD BP:      45.5 ml LV ESV:       41 ml                            LV SV:        72 ml RIGHT VENTRICLE             IVC RV Basal diam:  3.20 cm     IVC diam: 1.90 cm RV S prime:     14.00 cm/s TAPSE (M-mode): 2.0 cm LEFT ATRIUM             Index        RIGHT ATRIUM           Index LA diam:        2.40 cm 1.77 cm/m   RA Area:     12.60 cm LA Vol (A2C):   34.4 ml 25.40 ml/m  RA Volume:   27.20 ml  20.09 ml/m LA Vol (A4C):   38.6 ml 28.51 ml/m LA Biplane Vol: 38.1 ml 28.14 ml/m  AORTIC VALVE LVOT Vmax:   105.00 cm/s LVOT Vmean:  63.400 cm/s LVOT VTI:    0.182 m  AORTA Ao Root diam: 2.90 cm Ao Asc diam:  2.60 cm MITRAL VALVE               TRICUSPID VALVE MV Area (PHT): 3.23 cm    TR Peak grad:   28.5 mmHg MV Decel Time: 235 msec    TR Vmax:        267.00 cm/s MV E velocity: 75.00 cm/s MV A velocity: 91.70 cm/s  SHUNTS MV E/A ratio:  0.82        Systemic VTI:  0.18 m                            Systemic Diam: 1.80 cm Skeet Latch MD Electronically signed by Skeet Latch MD Signature Date/Time: 10/15/2022/1:44:12 PM    Final         Scheduled Meds:  acidophilus  1 capsule Oral Daily   apixaban  2.5 mg Oral BID   citalopram  40 mg Oral Daily   feeding supplement  237 mL Oral TID BM   gabapentin  100 mg Oral TID   levothyroxine  75 mcg Oral Q0600   multivitamin with minerals  1 tablet Oral Daily   senna-docusate  1 tablet Oral BID   Continuous Infusions:  dextrose 5 % and 0.9% NaCl Stopped (10/14/22 1408)     LOS: 3 days    Time spent:40 min    Hawraa Stambaugh, Geraldo Docker, MD [facility]   If 7PM-7AM, please contact night-coverage 10/15/2022, 4:02 PM

## 2022-10-15 NOTE — Progress Notes (Signed)
Echocardiogram 2D Echocardiogram has been performed.  Martha [name] 10/15/2022, 1:27 PM

## 2022-10-16 ENCOUNTER — Inpatient Hospital Stay (HOSPITAL_COMMUNITY): Payer: BC Managed Care – PPO

## 2022-10-16 ENCOUNTER — Ambulatory Visit: Payer: BC Managed Care – PPO

## 2022-10-16 ENCOUNTER — Other Ambulatory Visit: Payer: Self-pay | Admitting: Student

## 2022-10-16 DIAGNOSIS — C7951 Secondary malignant neoplasm of bone: Secondary | ICD-10-CM | POA: Diagnosis not present

## 2022-10-16 DIAGNOSIS — Z515 Encounter for palliative care: Secondary | ICD-10-CM | POA: Diagnosis not present

## 2022-10-16 DIAGNOSIS — C799 Secondary malignant neoplasm of unspecified site: Secondary | ICD-10-CM | POA: Diagnosis not present

## 2022-10-16 DIAGNOSIS — Z79899 Other long term (current) drug therapy: Secondary | ICD-10-CM

## 2022-10-16 DIAGNOSIS — G893 Neoplasm related pain (acute) (chronic): Secondary | ICD-10-CM | POA: Diagnosis not present

## 2022-10-16 DIAGNOSIS — Z66 Do not resuscitate: Secondary | ICD-10-CM

## 2022-10-16 DIAGNOSIS — R443 Hallucinations, unspecified: Secondary | ICD-10-CM | POA: Diagnosis not present

## 2022-10-16 DIAGNOSIS — M542 Cervicalgia: Secondary | ICD-10-CM | POA: Diagnosis not present

## 2022-10-16 DIAGNOSIS — Z7189 Other specified counseling: Secondary | ICD-10-CM

## 2022-10-16 DIAGNOSIS — R404 Transient alteration of awareness: Secondary | ICD-10-CM

## 2022-10-16 HISTORY — PX: IR IMAGING GUIDED PORT INSERTION: IMG5740

## 2022-10-16 LAB — CBC WITH DIFFERENTIAL/PLATELET
Abs Immature Granulocytes: 0.45 10*3/uL — ABNORMAL HIGH (ref 0.00–0.07)
Basophils Absolute: 0 10*3/uL (ref 0.0–0.1)
Basophils Relative: 1 %
Eosinophils Absolute: 0.1 10*3/uL (ref 0.0–0.5)
Eosinophils Relative: 1 %
HCT: 33.7 % — ABNORMAL LOW (ref 36.0–46.0)
Hemoglobin: 11.3 g/dL — ABNORMAL LOW (ref 12.0–15.0)
Immature Granulocytes: 7 %
Lymphocytes Relative: 5 %
Lymphs Abs: 0.3 10*3/uL — ABNORMAL LOW (ref 0.7–4.0)
MCH: 31.4 pg (ref 26.0–34.0)
MCHC: 33.5 g/dL (ref 30.0–36.0)
MCV: 93.6 fL (ref 80.0–100.0)
Monocytes Absolute: 0.5 10*3/uL (ref 0.1–1.0)
Monocytes Relative: 8 %
Neutro Abs: 5.2 10*3/uL (ref 1.7–7.7)
Neutrophils Relative %: 78 %
Platelets: 131 10*3/uL — ABNORMAL LOW (ref 150–400)
RBC: 3.6 MIL/uL — ABNORMAL LOW (ref 3.87–5.11)
RDW: 15 % (ref 11.5–15.5)
WBC: 6.5 10*3/uL (ref 4.0–10.5)
nRBC: 0 % (ref 0.0–0.2)

## 2022-10-16 LAB — CBC
HCT: 35.2 % — ABNORMAL LOW (ref 36.0–46.0)
Hemoglobin: 11.5 g/dL — ABNORMAL LOW (ref 12.0–15.0)
MCH: 31.5 pg (ref 26.0–34.0)
MCHC: 32.7 g/dL (ref 30.0–36.0)
MCV: 96.4 fL (ref 80.0–100.0)
Platelets: 125 10*3/uL — ABNORMAL LOW (ref 150–400)
RBC: 3.65 MIL/uL — ABNORMAL LOW (ref 3.87–5.11)
RDW: 14.8 % (ref 11.5–15.5)
WBC: 6.9 10*3/uL (ref 4.0–10.5)
nRBC: 0 % (ref 0.0–0.2)

## 2022-10-16 LAB — COMPREHENSIVE METABOLIC PANEL
ALT: 33 U/L (ref 0–44)
ALT: 34 U/L (ref 0–44)
AST: 98 U/L — ABNORMAL HIGH (ref 15–41)
AST: 102 U/L — ABNORMAL HIGH (ref 15–41)
Albumin: 3.4 g/dL — ABNORMAL LOW (ref 3.5–5.0)
Albumin: 3.3 g/dL — ABNORMAL LOW (ref 3.5–5.0)
Alkaline Phosphatase: 940 U/L — ABNORMAL HIGH (ref 38–126)
Alkaline Phosphatase: 977 U/L — ABNORMAL HIGH (ref 38–126)
Anion gap: 6 (ref 5–15)
Anion gap: 9 (ref 5–15)
BUN: 5 mg/dL — ABNORMAL LOW (ref 8–23)
BUN: <5 mg/dL — ABNORMAL LOW (ref 8–23)
CO2: 21 mmol/L — ABNORMAL LOW (ref 22–32)
CO2: 22 mmol/L (ref 22–32)
Calcium: 7.2 mg/dL — ABNORMAL LOW (ref 8.9–10.3)
Calcium: 7.6 mg/dL — ABNORMAL LOW (ref 8.9–10.3)
Chloride: 104 mmol/L (ref 98–111)
Chloride: 105 mmol/L (ref 98–111)
Creatinine, Ser: <0.3 mg/dL — ABNORMAL LOW (ref 0.44–1.00)
Creatinine, Ser: 0.39 mg/dL — ABNORMAL LOW (ref 0.44–1.00)
GFR, Estimated: >60 mL/min (ref >60)
Glucose, Bld: 126 mg/dL — ABNORMAL HIGH (ref 70–99)
Glucose, Bld: 120 mg/dL — ABNORMAL HIGH (ref 70–99)
Potassium: 4.3 mmol/L (ref 3.5–5.1)
Potassium: 3.8 mmol/L (ref 3.5–5.1)
Sodium: 131 mmol/L — ABNORMAL LOW (ref 135–145)
Sodium: 136 mmol/L (ref 135–145)
Total Bilirubin: 0.9 mg/dL (ref 0.3–1.2)
Total Bilirubin: 0.7 mg/dL (ref 0.3–1.2)
Total Protein: 6 g/dL — ABNORMAL LOW (ref 6.5–8.1)
Total Protein: 5.6 g/dL — ABNORMAL LOW (ref 6.5–8.1)

## 2022-10-16 LAB — PHOSPHORUS: Phosphorus: 3.2 mg/dL (ref 2.5–4.6)

## 2022-10-16 LAB — APTT: aPTT: 37 seconds — ABNORMAL HIGH (ref 24–36)

## 2022-10-16 LAB — PROTIME-INR
INR: 1.4 — ABNORMAL HIGH (ref 0.8–1.2)
Prothrombin Time: 17.4 seconds — ABNORMAL HIGH (ref 11.4–15.2)

## 2022-10-16 LAB — MAGNESIUM: Magnesium: 2.5 mg/dL — ABNORMAL HIGH (ref 1.7–2.4)

## 2022-10-16 MED ORDER — MIDAZOLAM HCL 2 MG/2ML IJ SOLN
INTRAMUSCULAR | Status: AC | PRN
  Administered 2022-10-16 (×2): 1 mg via INTRAVENOUS

## 2022-10-16 MED ORDER — ACETAMINOPHEN 500 MG PO TABS
1000.0000 mg | ORAL_TABLET | Freq: Three times a day (TID) | ORAL | Status: DC
  Administered 2022-10-16 – 2022-10-20 (×9): 1000 mg via ORAL
  Filled 2022-10-16 (×9): qty 2

## 2022-10-16 MED ORDER — MIDAZOLAM HCL 2 MG/2ML IJ SOLN
INTRAMUSCULAR | Status: AC
  Filled 2022-10-16: qty 2

## 2022-10-16 MED ORDER — CEFAZOLIN SODIUM-DEXTROSE 2-4 GM/100ML-% IV SOLN: 2.0000 g | INTRAVENOUS | Status: DC

## 2022-10-16 MED ORDER — OXYCODONE HCL 5 MG PO TABS
2.5000 mg | ORAL_TABLET | ORAL | Status: DC | PRN
  Administered 2022-10-17 – 2022-10-20 (×3): 5 mg via ORAL
  Filled 2022-10-16 (×3): qty 1

## 2022-10-16 MED ORDER — FENTANYL CITRATE (PF) 100 MCG/2ML IJ SOLN
INTRAMUSCULAR | Status: AC
  Filled 2022-10-16: qty 2

## 2022-10-16 MED ORDER — FENTANYL CITRATE (PF) 100 MCG/2ML IJ SOLN
INTRAMUSCULAR | Status: AC | PRN
  Administered 2022-10-16 (×2): 50 ug via INTRAVENOUS

## 2022-10-16 MED ORDER — PREGABALIN 25 MG PO CAPS
25.0000 mg | ORAL_CAPSULE | Freq: Every day | ORAL | Status: DC
  Administered 2022-10-17 – 2022-10-20 (×4): 25 mg via ORAL
  Filled 2022-10-16 (×4): qty 1

## 2022-10-16 MED ORDER — LORAZEPAM 0.5 MG PO TABS
0.5000 mg | ORAL_TABLET | Freq: Four times a day (QID) | ORAL | Status: DC | PRN
  Administered 2022-10-18 (×2): 0.5 mg via ORAL
  Filled 2022-10-16 (×2): qty 1

## 2022-10-16 MED ORDER — CHLORHEXIDINE GLUCONATE CLOTH 2 % EX PADS
6.0000 | MEDICATED_PAD | Freq: Every day | CUTANEOUS | Status: DC
  Administered 2022-10-16 – 2022-10-20 (×5): 6 via TOPICAL

## 2022-10-16 MED ORDER — LIDOCAINE-EPINEPHRINE 1 %-1:100000 IJ SOLN
INTRAMUSCULAR | Status: AC
  Administered 2022-10-16: 16 mL via INTRADERMAL
  Filled 2022-10-16: qty 1

## 2022-10-16 NOTE — Procedures (Signed)
Interventional Radiology Procedure Note  Procedure: Placement of a right IJ approach single lumen PowerPort.  Tip is positioned at the superior cavoatrial junction and catheter is ready for immediate use.  Complications: No immediate Recommendations:  - Ok to shower tomorrow - Do not submerge for 7 days - Routine line care   Signed,  Shawndell Schillaci K. Aivah Putman, MD   

## 2022-10-16 NOTE — Progress Notes (Signed)
PT Cancellation Note  Patient Details Name: Martha [name] MRN: [id number] DOB: 09/17/1960   Cancelled Treatment:    Reason Eval/Treat Not Completed: Patient declined, no reason specified Pt reports pending surgery.  RN confirms pt for port a cath placement today.  Will check back as schedule permits.    [name] 10/16/2022, 12:08 PM Arlyce Dice, DPT Physical Therapist [facility] Preferred contact method: Secure Chat Weekend Pager Only: [phone] Office: [phone]

## 2022-10-16 NOTE — Progress Notes (Signed)
Daily Progress Note   Patient Name: Martha [name]       Date: 10/16/2022 DOB: 1961-04-21  Age: 62 y.o. MRN#: [id number] Attending Physician: Martha Bossier, MD Primary Care Physician: Martha Barrack, MD Admit Date: 10/12/2022 Length of Stay: 4 days  Reason for Consultation/Follow-up: Establishing goals of care and Symptom Management  Subjective:   CC: Patient notes abdominal pain improved with medication. Following up regarding complex medical decision making and symptom management.   Subjective:  At time of EMR review, patient received IV ativan 82m x1 dose and Norco 567mx1 dose in past 24 hours.  Presented to bedside to meet with patient. No family present at bedside. Patient was reviewing placement of Port with IR. Planned for procedure either today or tomorrow.  Spent time reviewing patient's medications with her. Patient noted she has received Ativan for management of agitation that has been associated with the sensation of feeling like she is "floating above her bed and going to fall out of it". Patient also notes hallucinations that can be worse at night. When exploring how long patient has been having these, she notes it has been since  November/December when she had her back surgeries. When discussing her medications, patient noted she was started on gabapentin at that same time to manage neuropathic pain. Patient denied "floating" or hallucinations occurring at all prior to November/December. Inquired if patient has been tried on Lyrica and she noted she has only ever been on the gabapentin. Did state neuropathic pain has been managed by gabapentin since she has been receiving it. We discussed in light of the timing, may be worth transition to Lyrica to determine if patient's associated symptoms and hallucinations stop once gabapentin is discontinued. Patient agreeing with transition to Lyrica at this time.   Also discussed discontinuing IV Ativan and instead allowing for  low dose oral Ativan for breakthrough agitation. Hopeful that if hallucinations can be managed, ativan will no longer be needed for that. Patient agreeing with this plan.   Also discussed patient's pain management. Patient noted that normally Tylenol does help to alleviate her pain though she has the Norco 39m26mf needed. She feels the Norco dose helps; denies any adverse effects. Pain primary in back and abdomen. Discussed Changing patient's regimen to hopefully simplify opioid dosing. Will scheduled Tylenol 1000 mg TID and then allow for oxycodone q 4hrs prn for breakthrough management.  Patient also states that she does not regularly take flexeril and does not feel medication benefits her pain. Noted would discontinue at this time to further minimize medication interactions. Patient agreeing with this.   Patient noted two bowel movements on 2/18. Discussed importance of regular bowel movement;  AT LEAST once every 3 days. Patient will continue to monitor and note if constipation worsens.   All questions answered at that time. Thanked patient for allowing this provider to visit with her today.   Review of Systems  Objective:   Vital Signs:  BP 135/73 (BP Location: Left Arm)   Pulse 93   Temp 98 F (36.7 C) (Oral)   Resp 16   Ht 5' 2"$  (1.575 m)   Wt 40.3 kg   SpO2 100%   BMI 16.25 kg/m   Physical Exam: General: NAD, alert, laying in bed, pleasant  Eyes: no drainage noted HENT: dry mucous membranes Cardiovascular: RRR, no edema in LE b/l Respiratory: no increased work of breathing noted, not in respiratory distress Abdomen: not distended Extremities: moving all appropriately  Skin:  no rashes or lesions on visible skin Neuro: A&Ox4, following commands easily Psych: appropriately answers all questions  Imaging:  I personally reviewed recent imaging.   Assessment & Plan:   Assessment: Palliative Care consult requested for goals of care discussion in this 62 y.o. female  with  past medical history of ER+ breast cancer (01/2018), right breast cancer (04/2021), now with metastatic disease progression involving brain and liver, pathological fractures with osseous mets s/p brain radiation. She was admitted on 10/12/2022 from home with confusion and weakness. Recently discharged from [facility] on 1/21 after cervical spine surgery.    Recommendations/Plan: # Complex medical decision making/goals of care:  - DNR/DNI status was confirmed by patient and husband on 10/15/22.  - Continue with current plan of care. Patient and husband clear in expressed wishes to continue to treat the treatable aggressively. They are realistic in their understanding of current disease progression and poor long-term prognosis. Hopeful for the ability to continue to thrive and start chemotherapy.  - Patient previously stated that if she is unable to make medical decisions for herself, she would want her husband to make medical decision for her.   -  Code Status: DNR  # Symptom management:  -Pain, acute on chronic in setting on metastatic breat cancer -Discontinue Hydrocodone 5/325 mg as needed -Discontinue Gabapentin 133m TID. Essentially patient has already been undergoing taper due to decrease in medication.  -Continue Lidocaine mouth solution prn  -Discontinue Flexeril -Start Tylenol 1027mq8hrs scheduled during the day -Start oxycodone 2.5-52m37m4hrs prn -Start Lyrica 252m106mily in AM on 2/20   -Anxiety/Depression   -Discontinue IV Ativan   -Start po Ativan 0.52mg 63mrs prn   -Continue Celexa 40mg 19my   -Constipation   -Miralax as needed   -Continue Senna 1 tab twice daily   -Nausea   -Zofran as needed  # Discharge Planning: TBD  -[facility] will need set up at time of discharge.   Discussed with: hospitalist, oncology, bedside RN, rad onc  Thank you for allowing the palliative care team to participate in the care Martha [facility] Provider PMT # [phone] remains symptomatic despite maximum doses, please call PMT at [phone] and [phone]. Outside of these hours, please call attending, as PMT does not have night coverage.

## 2022-10-16 NOTE — Progress Notes (Signed)
PROGRESS NOTE    Martha [name]  [id number] DOB: 11-Nov-1960 DOA: 10/12/2022 PCP: [name], MD     Brief Narrative:   62 y.o. WF PMHx anxiety, depression, dysrhythmia, ER positive breast cancer diagnosed 12/2017 with metastasis to cervical spine and brain discovered in July 2023   Admitted to the hospital for evaluation and management of confusion and weakness.  The patient was recently discharged from [facility] health inpatient rehab on 1/31.  She is currently undergoing radiation to her brain.  She has some chronic dizziness, and sensation of falling out of the bed, husband states that this has been going on for several weeks especially after her more recent cervical spine surgery in January 2024.  This problem seems to come and go, has been worse in the last 2 to 3 days.  Patient's husband also states that she has been somewhat confused and having some hallucinations, for example seeing people in her room and even this morning here in the hospital who are not present.  She has never really had this problem before, started in the last 3 to 4 days.  Per the patient and her husband, it seems that this confusion is somewhat transient, does not last very long.  While she was in rehab, her Celexa dose was increased to 40 mg, her gabapentin was titrated up to 200 mg p.o. 3 times daily.  There have been no other significant medication changes, and there has not been any medication changes since discharge from rehab.  Overall patient and her husband feel that she has been doing well considering what she has been through, she continues to ambulate with a walker, she is supposed to start outpatient therapy soon.   ED Course: Husband called EMS and the patient was brought to the emergency department for evaluation, where she has unremarkable vital signs and is stable on room air.  So far lab work reveals normal white blood cell count, normal hemoglobin, platelets 128.  Sodium 132, potassium 3.4.   Renal function is normal.  Calcium 12.7.  Lactate is normal at 1.4.   Subjective: 2/19 checked twice still on steroids having port placed by IR no charge   Assessment & Plan: Covid vaccination;   Principal Problem:   Hallucination Active Problems:   Malignant neoplasm of lower-inner quadrant of left breast in female, estrogen receptor positive ([place])   Cervicalgia   Cancer, metastatic to bone ([place])   Change in mental status   AMS (altered mental status)   Depression   Cancer associated pain  Malignant neoplasm of lower-inner quadrant of LEFT breast, ER positive  -Patient sees Dr. Burr Medico oncologist and Dr. Eppie Gibson radiation oncologist -Dx 12/2017 - S/p LEFT breast lumpectomy and adjuvant XRT - Letrozole started 05/2018 - lost f/u after visit in 04/2020 until her recurrence in 07/2022     Cancer, metastatic to bone  -07/24/2022 MRI C-spine C-spine mets see below extensive osseous metastatic disease with pathologic fracture at the base of dens and C2 right lateral mass. Extraosseous tumor at C1 and C2 likely impinging on the right C2 and C3 nerve roots.  -08/03/2022 PET scan diffuse bone mets -08/17/2022 cervical laminectomy and fusion by Dr. Marcello Moores biopsy confirmed metastatic breast cancer, ER/PR negative, HER2 positive. -08/17/2022 due to cervical cord compression with myelopathy, she underwent a second cervical spine surgery on September 06, 2022 -S/p brain XRT -S/p chemotherapy -2/16 per Dr.Feng oncology note from 2/15 treatment plan is as follows  -lab and brain MRI reviewed,  unremarkable except calcium is elevated 12.7 today, which can contributed to her confusion. -will give her a doze of zometa 76m  -Her gabapentin has been dose reduced, she is tolerating Norco well,  -Urine and blood culture are pending -Patient is scheduled to start chemotherapy next week.   -2/16 Discussed case with Dr. SEppie Gibsonradiation oncologist revised XRT plan.  Now will only receive 1 dose of  L-spine XRT today. -2/17 completed XRT treatment x 1 states back pain 3/10 today -2/18 has agreed to have port placed, and began chemotherapy on Thursday with Dr.Feng oncology. - 2/18 attempt to have IR placed port in the A.m. here.  If not we will need to arrange transport for [facility] to [facility](has appointment on Tuesday at 1400) -2/19 Dr. HJacqulynn [name] IR agreed to place port today  Altered mental status - Most likely multifactorial, brain mets, pain management medication, probably severe protein calorie malnutrition, possible infection. - 2/15 blood NGTD -2/15 urine negative -2/16 patient continues to have waxing and waning hallucinations (sees her nephew) -2/16 ammonia, TSH WNL -2/16 will consult PT/OT in a.m. -2/16 decrease Gabapentin 100 mg TID -2/17 patient appears to be A/O x 4 but having episodes of expressive aphasia -2/18 patient cognition appears worse in the day, but per husband recently received Ativan for agitation..  Depression - Celexa  Hypokalemia - Potassium goal> 4 - 2/16 K-Phos 30 mmol - 2/16 K-Dur 40 mEq -2/17 K-Phos 30 mmol - 2/17 K-Dur 40 mEq --2/18 K-Phos 30 mmol --2/18 Potassium 545m  Hypomagnesmia - Magnesium goal> 2 - 2/17 MagnesiumIV 2 g --2/18 Magnesium IV 3 g  Phosphatemia - Phosphorus goal> -2/18 see HypoKalemia   Severe protein calorie malnutrition -2/16 D5-0.9% saline 7519mr - 2/16 nutrition consult   Goals of care - 2/17 Palliative Care consult: Patient and family would like to speak with palliative care concerning options.      Mobility Assessment (last 72 hours)     Mobility Assessment     Row Name 10/16/22 0825 10/15/22 1100 10/14/22 2010 10/14/22 1238 10/13/22 1050   Does patient have an order for bedrest or is patient medically unstable No - Continue assessment No - Continue assessment No - Continue assessment -- --   What is the highest level of mobility based on the progressive mobility assessment? Level 4  (Walks with assist in room) - Balance while marching in place and cannot step forward and back - Complete Level 4 (Walks with assist in room) - Balance while marching in place and cannot step forward and back - Complete Level 4 (Walks with assist in room) - Balance while marching in place and cannot step forward and back - Complete Level 4 (Walks with assist in room) - Balance while marching in place and cannot step forward and back - Complete Level 3 (Stands with assist) - Balance while standing  and cannot march in place   Is the above level different from baseline mobility prior to current illness? Yes - Recommend PT order Yes - Recommend PT order Yes - Recommend PT order -- --                  DVT prophylaxis: Eliquis Code Status: DNR Family Communication: 2/18 DonElenore Rotausband), and sister at bedside discussed plan of care all questions answered.   Status is: Inpatient    Dispo: The patient is from: Home              Anticipated d/c is to: SNF??  Anticipated d/c date is: 3 days              Patient currently is not medically stable to d/c.      Consultants:  Dr. Eppie Gibson radiation oncology   Procedures/Significant Events:  07/24/2022 MRI C-spine  extensive osseous metastatic disease with pathologic fracture at the base of dens and C2 right lateral mass. Extraosseous tumor at C1 and C2 likely impinging on the right C2 and C3 nerve roots. 09/13/2022 MRI brain W/W0 contrast; 22 enhancing lesions are identified in the current study, annotated on series 1100. New lesions are marked with double arrows.   New lesions:   Right cerebellar hemisphere, 1 mm, image 122.   Left cerebellar hemisphere, 1 mm, image 139   Enlarged lesions:   Left cerebellar floccule, 12 mm (10 mm on prior), image 127   Right cerebellar hemisphere, 5 mm (3 mm on prior), image 122   Right occipital lobe: 4 mm (3 mm on prior), image 146   Left occipital lobe, 3 mm (2 mm on prior),  image 159   Left occipital lobe, 7 mm (5 mm on prior), image 161   Left frontal operculum, 2 mm (1 mm on prior) image 172   Left occipital lobe, 2 mm (1 mm on prior), image 173   Right subinsular region, 5 mm (3 mm on prior) image 176   Right posterior temporal, 3 mm) 1 mm on prior), image 177   Left anterior cingulate, 3 mm (2 mm on prior), image 193   Left posterior frontal, 2.5 mm (1.5 mm on prior) image 195   Right parietal, 4 mm (3 mm on prior), image 196   Left parietal, 1.5 mm (less than 1 mm on prior), image 200   Left frontal lobe, 2 mm (less than 1 mm on prior), image 203   Left parietal lobe, 2 mm (less than 1 mm on prior), image 213   Right frontal lobe, 1.5 mm (less than 1 mm on prior), image 222   Unchanged lesions:   Right cerebellar hemisphere, 1 mm, image 122   Right temporal lobe, 3 mm, image 145   Left posterior temporal, 3 mm, image 184   Left periventricular, 2 mm, image 192   Mild surrounding edema is seen associated with the the 7 mm medial left occipital lobe lesion. No significant vasogenic edema associated with the other lesions. 2/16 MRI Brain wo contrast;The lesion at the left CP angle is grossly similar. Additional enhancing lesions seen on the prior cannot be accurately assessed without contrast. Consider postcontrast imaging. 2. No significant new mass effect or other obvious acute abnormality. 3. Osseous metastatic disease.  I have personally reviewed and interpreted all radiology studies and my findings are as above.  VENTILATOR SETTINGS:    Cultures   Antimicrobials:    Devices    LINES / TUBES:      Continuous Infusions:  dextrose 5 % and 0.9% NaCl 75 mL/hr at 10/16/22 0344     Objective: Vitals:   10/15/22 0007 10/15/22 0341 10/15/22 2018 10/16/22 0604  BP: (!) 129/98 119/75 130/73 135/73  Pulse: (!) 102 96 92 93  Resp: 14 18 20 16  $ Temp: 97.7 F (36.5 C) 98.4 F (36.9 C) 98.8 F (37.1 C) 98 F (36.7  C)  TempSrc: Oral Oral Oral Oral  SpO2: (!) 89% 94% 94% 100%  Weight:      Height:        Intake/Output Summary (Last 24  hours) at 10/16/2022 0949 Last data filed at 10/16/2022 [id number] Gross per 24 hour  Intake 2504.1 ml  Output 2100 ml  Net 404.1 ml    Filed Weights   10/12/22 0603  Weight: 40.3 kg    Physical Exam: checked twice still on steroids having port placed by IR no charge   .     Data Reviewed: Care during the described time interval was provided by me .  I have reviewed this patient's available data, including medical history, events of note, physical examination, and all test results as part of my evaluation.  CBC: Recent Labs  Lab 10/12/22 0445 10/13/22 0628 10/14/22 0606 10/15/22 0632 10/16/22 0512  WBC 8.4 7.4 5.7 6.9 6.5  NEUTROABS 7.1 5.8 4.9 6.1 5.2  HGB 12.6 12.3 10.7* 11.0* 11.3*  HCT 36.9 36.6 31.5* 33.3* 33.7*  MCV 92.7 93.4 94.0 95.4 93.6  PLT 128* 135* 117* 129* 131*    Basic Metabolic Panel: Recent Labs  Lab 10/12/22 0445 10/13/22 0600 10/13/22 0628 10/14/22 0606 10/15/22 0632 10/16/22 0512  NA 132*  --  135 138 135 131*  K 3.4*  --  2.7* 3.3* 3.3* 4.3  CL 93*  --  97* 104 106 104  CO2 25  --  25 23 20* 21*  GLUCOSE 104*  --  116* 123* 127* 126*  BUN 23  --  18 20 11 $ 5*  CREATININE 0.50  --  0.62 0.57 0.34* <0.30*  CALCIUM 12.7*  --  12.2* 10.1 8.2* 7.2*  MG  --  2.3  --  1.8 1.9 2.5*  PHOS  --  2.6  --  1.9* 1.7* 3.2    GFR: CrCl cannot be calculated (This lab value cannot be used to calculate CrCl because it is not a number: <0.30). Liver Function Tests: Recent Labs  Lab 10/12/22 0445 10/14/22 0606 10/15/22 0632 10/16/22 0512  AST 102* 94* 93* 98*  ALT 29 28 31 $ 33  ALKPHOS 723* 737* 859* 940*  BILITOT 1.2 0.8 0.7 0.9  PROT 6.7 5.8* 5.8* 6.0*  ALBUMIN 3.8 3.4* 3.4* 3.4*    No results for input(s): "LIPASE", "AMYLASE" in the last 168 hours. Recent Labs  Lab 10/12/22 0652  AMMONIA 23    Coagulation  Profile: Recent Labs  Lab 10/12/22 0652  INR 1.3*    Cardiac Enzymes: No results for input(s): "CKTOTAL", "CKMB", "CKMBINDEX", "TROPONINI" in the last 168 hours. BNP (last 3 results) No results for input(s): "PROBNP" in the last 8760 hours. HbA1C: No results for input(s): "HGBA1C" in the last 72 hours. CBG: No results for input(s): "GLUCAP" in the last 168 hours. Lipid Profile: No results for input(s): "CHOL", "HDL", "LDLCALC", "TRIG", "CHOLHDL", "LDLDIRECT" in the last 72 hours. Thyroid Function Tests: No results for input(s): "TSH", "T4TOTAL", "FREET4", "T3FREE", "THYROIDAB" in the last 72 hours.  Anemia Panel: No results for input(s): "VITAMINB12", "FOLATE", "FERRITIN", "TIBC", "IRON", "RETICCTPCT" in the last 72 hours. Sepsis Labs: Recent Labs  Lab 10/12/22 0445 10/12/22 0652  PROCALCITON  --  0.10  LATICACIDVEN 1.4  --      Recent Results (from the past 240 hour(s))  Resp panel by RT-PCR (RSV, Flu A&B, Covid) Anterior Nasal Swab     Status: None   Collection Time: 10/12/22  4:35 AM   Specimen: Anterior Nasal Swab  Result Value Ref Range Status   SARS Coronavirus 2 by RT PCR NEGATIVE NEGATIVE Final    Comment: (NOTE) SARS-CoV-2 target nucleic acids are NOT DETECTED.  The  SARS-CoV-2 RNA is generally detectable in upper respiratory specimens during the acute phase of infection. The lowest concentration of SARS-CoV-2 viral copies this assay can detect is 138 copies/mL. A negative result does not preclude SARS-Cov-2 infection and should not be used as the sole basis for treatment or other patient management decisions. A negative result may occur with  improper specimen collection/handling, submission of specimen other than nasopharyngeal swab, presence of viral mutation(s) within the areas targeted by this assay, and inadequate number of viral copies(<138 copies/mL). A negative result must be combined with clinical observations, patient history, and  epidemiological information. The expected result is Negative.  Fact Sheet for Patients:  EntrepreneurPulse.com.au  Fact Sheet for Healthcare Providers:  IncredibleEmployment.be  This test is no t yet approved or cleared by the [place] FDA and  has been authorized for detection and/or diagnosis of SARS-CoV-2 by FDA under an Emergency Use Authorization (EUA). This EUA will remain  in effect (meaning this test can be used) for the duration of the COVID-19 declaration under Section 564(b)(1) of the Act, 21 U.S.C.section 360bbb-3(b)(1), unless the authorization is terminated  or revoked sooner.       Influenza A by PCR NEGATIVE NEGATIVE Final   Influenza B by PCR NEGATIVE NEGATIVE Final    Comment: (NOTE) The Xpert Xpress SARS-CoV-2/FLU/RSV plus assay is intended as an aid in the diagnosis of influenza from Nasopharyngeal swab specimens and should not be used as a sole basis for treatment. Nasal washings and aspirates are unacceptable for Xpert Xpress SARS-CoV-2/FLU/RSV testing.  Fact Sheet for Patients: EntrepreneurPulse.com.au  Fact Sheet for Healthcare Providers: IncredibleEmployment.be  This test is not yet approved or cleared by the [place] FDA and has been authorized for detection and/or diagnosis of SARS-CoV-2 by FDA under an Emergency Use Authorization (EUA). This EUA will remain in effect (meaning this test can be used) for the duration of the COVID-19 declaration under Section 564(b)(1) of the Act, 21 U.S.C. section 360bbb-3(b)(1), unless the authorization is terminated or revoked.     Resp Syncytial Virus by PCR NEGATIVE NEGATIVE Final    Comment: (NOTE) Fact Sheet for Patients: EntrepreneurPulse.com.au  Fact Sheet for Healthcare Providers: IncredibleEmployment.be  This test is not yet approved or cleared by the [place] FDA and has been  authorized for detection and/or diagnosis of SARS-CoV-2 by FDA under an Emergency Use Authorization (EUA). This EUA will remain in effect (meaning this test can be used) for the duration of the COVID-19 declaration under Section 564(b)(1) of the Act, 21 U.S.C. section 360bbb-3(b)(1), unless the authorization is terminated or revoked.  Performed at [facility], [place] [address] [place], [place] [address]   Blood Culture (routine x 2)     Status: None (Preliminary result)   Collection Time: 10/12/22  4:35 AM   Specimen: BLOOD  Result Value Ref Range Status   Specimen Description   Final    BLOOD LEFT ANTECUBITAL Performed at [place] [address] [place], [place] [address]    Special Requests   Final    BOTTLES DRAWN AEROBIC AND ANAEROBIC Blood Culture adequate volume Performed at [place] [address], [place], [place] [address]    Culture   Final    NO GROWTH 4 DAYS Performed at [facility], [place] [address], [place], [place] [address]    Report Status PENDING  Incomplete  Blood Culture (routine x 2)     Status: None (Preliminary result)  Collection Time: 10/12/22  4:45 AM   Specimen: BLOOD  Result Value Ref Range Status   Specimen Description   Final    BLOOD RIGHT ANTECUBITAL Performed at [facility] [address] [place], [place] [address]    Special Requests   Final    BOTTLES DRAWN AEROBIC AND ANAEROBIC Blood Culture results may not be optimal due to an excessive volume of blood received in culture bottles Performed at [place] [address] [place], [place] [address]    Culture   Final    NO GROWTH 4 DAYS Performed at [facility], [place] [address], [place], [place] [address]    Report Status PENDING  Incomplete  Urine Culture (for pregnant, neutropenic or urologic patients or patients with an indwelling urinary catheter)     Status: None    Collection Time: 10/12/22  6:43 AM   Specimen: Urine, Clean Catch  Result Value Ref Range Status   Specimen Description   Final    URINE, CLEAN CATCH Performed at [facility], [place] [address], [place], [place] [address]    Special Requests   Final    NONE Performed at [facility], [place] [address] [place], [place] [address]    Culture   Final    NO GROWTH Performed at [facility], [place] [address] [place], [place] [address]    Report Status 10/13/2022 FINAL  Final         Radiology Studies: ECHOCARDIOGRAM COMPLETE  Result Date: 10/15/2022    ECHOCARDIOGRAM REPORT   Patient Name:   KNIGHTLEY GENSEL Date of Exam: 10/15/2022 Medical Rec #:  [id number]             Height:       62.0 in Accession #:    [id number]            Weight:       88.8 lb Date of Birth:  [birth year]             BSA:          1.354 m Patient Age:    76 years              BP:           119/75 mmHg Patient Gender: F                     HR:           90 bpm. Exam Location:  Inpatient Procedure: 2D Echo, 3D Echo, Cardiac Doppler, Color Doppler and Strain Analysis Indications:    chemo  History:        Patient has no prior history of Echocardiogram examinations.  Sonographer:    Phineas Douglas Referring Phys: [id number] [place]  1. Left ventricular ejection fraction, by estimation, is 55 to 60%. The left ventricle has normal function. The left ventricle has no regional wall motion abnormalities. Left ventricular diastolic parameters are consistent with Grade I diastolic dysfunction (impaired relaxation). The average left ventricular global longitudinal strain is -14.9 %. The global longitudinal strain is abnormal.  2. Right ventricular systolic function is normal. The right ventricular size is normal. There is mildly elevated pulmonary artery systolic pressure.  3. The mitral valve is normal in structure. Trivial mitral valve regurgitation. No evidence of mitral  stenosis.  4. The aortic valve is tricuspid. Aortic valve regurgitation is not visualized. No aortic  stenosis is present.  5. The inferior vena cava is normal in size with <50% respiratory variability, suggesting right atrial pressure of 8 mmHg. FINDINGS  Left Ventricle: Left ventricular ejection fraction, by estimation, is 55 to 60%. The left ventricle has normal function. The left ventricle has no regional wall motion abnormalities. The average left ventricular global longitudinal strain is -14.9 %. The global longitudinal strain is abnormal. The left ventricular internal cavity size was normal in size. There is no left ventricular hypertrophy. Left ventricular diastolic parameters are consistent with Grade I diastolic dysfunction (impaired relaxation). Indeterminate filling pressures. Right Ventricle: The right ventricular size is normal. No increase in right ventricular wall thickness. Right ventricular systolic function is normal. There is mildly elevated pulmonary artery systolic pressure. The tricuspid regurgitant velocity is 2.67  m/s, and with an assumed right atrial pressure of 8 mmHg, the estimated right ventricular systolic pressure is [id number] mmHg. Left Atrium: Left atrial size was normal in size. Right Atrium: Right atrial size was normal in size. Pericardium: There is no evidence of pericardial effusion. Mitral Valve: The mitral valve is normal in structure. Trivial mitral valve regurgitation. No evidence of mitral valve stenosis. Tricuspid Valve: The tricuspid valve is normal in structure. Tricuspid valve regurgitation is trivial. No evidence of tricuspid stenosis. Aortic Valve: The aortic valve is tricuspid. Aortic valve regurgitation is not visualized. No aortic stenosis is present. Pulmonic Valve: The pulmonic valve was normal in structure. Pulmonic valve regurgitation is not visualized. No evidence of pulmonic stenosis. Aorta: The aortic root is normal in size and structure. Venous: The inferior  vena cava is normal in size with less than 50% respiratory variability, suggesting right atrial pressure of 8 mmHg. IAS/Shunts: No atrial level shunt detected by color flow Doppler. Additional Comments: There is a small pleural effusion in the left lateral region.  LEFT VENTRICLE PLAX 2D LVIDd:         3.50 cm     Diastology LVIDs:         2.20 cm     LV e' medial:    6.31 cm/s LV PW:         1.10 cm     LV E/e' medial:  11.9 LV IVS:        1.00 cm     LV e' lateral:   9.68 cm/s LVOT diam:     1.80 cm     LV E/e' lateral: 7.7 LV SV:         46 LV SV Index:   34          2D Longitudinal Strain LVOT Area:     2.54 cm    2D Strain GLS (A2C):   -12.7 %                            2D Strain GLS (A3C):   -17.5 %                            2D Strain GLS (A4C):   -14.6 % LV Volumes (MOD)           2D Strain GLS Avg:     -14.9 % LV vol d, MOD A2C: 68.3 ml LV vol d, MOD A4C: 79.9 ml LV vol s, MOD A2C: 24.8 ml LV vol s, MOD A4C: 31.9 ml 3D Volume EF: LV SV MOD A2C:     43.5 ml  3D EF:        64 % LV SV MOD A4C:     79.9 ml LV EDV:       113 ml LV SV MOD BP:      45.5 ml LV ESV:       41 ml                            LV SV:        72 ml RIGHT VENTRICLE             IVC RV Basal diam:  3.20 cm     IVC diam: 1.90 cm RV S prime:     14.00 cm/s TAPSE (M-mode): 2.0 cm LEFT ATRIUM             Index        RIGHT ATRIUM           Index LA diam:        2.40 cm 1.77 cm/m   RA Area:     12.60 cm LA Vol (A2C):   34.4 ml 25.40 ml/m  RA Volume:   27.20 ml  20.09 ml/m LA Vol (A4C):   38.6 ml 28.51 ml/m LA Biplane Vol: 38.1 ml 28.14 ml/m  AORTIC VALVE LVOT Vmax:   105.00 cm/s LVOT Vmean:  63.400 cm/s LVOT VTI:    0.182 m  AORTA Ao Root diam: 2.90 cm Ao Asc diam:  2.60 cm MITRAL VALVE               TRICUSPID VALVE MV Area (PHT): 3.23 cm    TR Peak grad:   28.5 mmHg MV Decel Time: 235 msec    TR Vmax:        267.00 cm/s MV E velocity: 75.00 cm/s MV A velocity: 91.70 cm/s  SHUNTS MV E/A ratio:  0.82        Systemic VTI:  0.18 m                             Systemic Diam: 1.80 cm Skeet Latch MD Electronically signed by Skeet Latch MD Signature Date/Time: 10/15/2022/1:44:12 PM    Final         Scheduled Meds:  acidophilus  1 capsule Oral Daily   apixaban  2.5 mg Oral BID   citalopram  40 mg Oral Daily   feeding supplement  237 mL Oral TID BM   gabapentin  100 mg Oral TID   levothyroxine  75 mcg Oral Q0600   multivitamin with minerals  1 tablet Oral Daily   senna-docusate  1 tablet Oral BID   Continuous Infusions:  dextrose 5 % and 0.9% NaCl 75 mL/hr at 10/16/22 0344     LOS: 4 days    Time spent:40 min    Martha [name], Geraldo Docker, MD [facility]   If 7PM-7AM, please contact night-coverage 10/16/2022, 9:49 AM

## 2022-10-16 NOTE — Consult Note (Addendum)
Chief Complaint: Patient was seen in consultation today for port a cath placement Chief Complaint  Patient presents with   Altered Mental Status    Referring Physician(s): Feng,Y  Supervising Physician: Jacqulynn Cadet  Patient Status: [facility] - In-pt  History of Present Illness: Martha [name] is a 62 y.o. female with past medical history significant for anxiety, depression, hypothyroidism, and metastatic left breast cancer. She was originally scheduled for outpatient Port-A-Cath placement at [facility] on 10/17/22, however was admitted to [facility] on 2/15 with confusion and weakness felt to be likely from medication or hypercalcemia. Urine/blood cx neg to date. Pt afebrile.  WBC nl.  She is currently alert and oriented.  Following discussion with oncology they would like port placed today in anticipation of chemotherapy this week.  Past Medical History:  Diagnosis Date   Anxiety    Cancer ([place]) 2022   right breast LCIS   Cancer ([place]) 2019   left breast   Depression    Dysrhythmia    SVT, s/p ablation ~ 2012 in at [facility]   Ectopic pregnancy    Family history of breast cancer    Family history of melanoma    History of radiation therapy 04/22/18-06/03/18   Left Breast, left SCV, axilla 50 Gy in 25 fractions, Left breast boost 10 Gy in 5 fractions.    Hypothyroidism    Personal history of radiation therapy    PONV (postoperative nausea and vomiting)    Thyroid disease     Past Surgical History:  Procedure Laterality Date   APPENDECTOMY     BILATERAL SALPINGECTOMY     BREAST BIOPSY Right 2014   fibroadenoma   BREAST BIOPSY Left 01/16/2018   BREAST BIOPSY Right 02/02/2020   LCIS   BREAST BIOPSY Right 08/04/2020   x2 LCIS   BREAST BIOPSY Right 08/13/2020   x2   BREAST EXCISIONAL BIOPSY Left 1990   benign   BREAST EXCISIONAL BIOPSY Right 02/02/2020   LCIS   BREAST LUMPECTOMY Left 01/22/2018   BREAST LUMPECTOMY WITH AXILLARY LYMPH NODE  BIOPSY Left 02/21/2018   Procedure: LEFT BREAST LUMPECTOMY WITH AXILLARY LYMPH NODE BIOPSY;  Surgeon: [name], MD;  Location: [place];  Service: General;  Laterality: Left;   BREAST LUMPECTOMY WITH RADIOACTIVE SEED LOCALIZATION Right 03/18/2020   Procedure: RIGHT BREAST LUMPECTOMY WITH RADIOACTIVE SEED LOCALIZATION;  Surgeon: [name], MD;  Location: [place];  Service: General;  Laterality: Right;   BREAST SURGERY Left 1993   cyst removed   ENDOMETRIAL ABLATION     EYE SURGERY     GLAUCOMA SURGERY Bilateral    POSTERIOR CERVICAL FUSION/FORAMINOTOMY N/A 08/17/2022   Procedure: Cervical One-Cervical Four POSTERIOR CERVICAL FUSION,  Cervical One LAMINECTOMY REDUCTION OF Cervical Two Fracture, Biopsy of Right Cerical Two Pars  Lesion;  Surgeon: Vallarie Mare, MD;  Location: [place];  Service: Neurosurgery;  Laterality: N/A;   POSTERIOR CERVICAL FUSION/FORAMINOTOMY N/A 09/06/2022   Procedure: Posterior Cervical Fusion, Foraminotomy , Cervical Five-Six, Cervical Six-Seven; Extension of  fusion Cervical Four- Thoracic One;  Surgeon: Vallarie Mare, MD;  Location: [place];  Service: Neurosurgery;  Laterality: N/A;   RADIOACTIVE SEED GUIDED EXCISIONAL BREAST BIOPSY Right 04/28/2021   Procedure: RADIOACTIVE SEED GUIDED EXCISIONAL RIGHT BREAST BIOPSY X2;  Surgeon: [name], MD;  Location: [place];  Service: General;  Laterality: Right;   RE-EXCISION OF BREAST LUMPECTOMY Left 03/20/2018   Procedure: RE-EXCISION OF BREAST LUMPECTOMY;  Surgeon: [name], MD;  Location: [place];  Service: General;  Laterality: Left;    Allergies: Morphine and related  Medications: Prior to Admission medications   Medication Sig Start Date End Date Taking? Authorizing Provider  apixaban (ELIQUIS) 2.5 MG TABS tablet Take 1 tablet (2.5 mg total) by mouth 2 (two) times daily. 09/26/22  Yes Love, Ivan Anchors, PA-C  citalopram (CELEXA) 40 MG tablet Take 1 tablet (40 mg total)  by mouth daily. 09/26/22  Yes Love, Ivan Anchors, PA-C  cyclobenzaprine (FLEXERIL) 10 MG tablet Take 1 tablet (10 mg total) by mouth 3 (three) times daily as needed for muscle spasms. Patient taking differently: Take 5 mg by mouth 3 (three) times daily as needed for muscle spasms. 09/26/22  Yes Love, Ivan Anchors, PA-C  gabapentin (NEURONTIN) 100 MG capsule Take 2 capsules (200 mg total) by mouth 3 (three) times daily. 10/02/22  Yes Truitt Merle, MD  HYDROcodone-acetaminophen (NORCO/VICODIN) 5-325 MG tablet Take 1 tablet by mouth every 4 (four) hours as needed for severe pain. Patient taking differently: Take 0.5 tablets by mouth every 4 (four) hours as needed for severe pain. 09/26/22  Yes Love, Ivan Anchors, PA-C  levothyroxine (SYNTHROID) 75 MCG tablet Take 1 tablet (75 mcg total) by mouth daily before breakfast. 09/26/22  Yes Love, Ivan Anchors, PA-C  lidocaine (XYLOCAINE) 2 % solution Patient: Mix 1part 2% viscous lidocaine, 1part H20. Swallow 53m of diluted mixture, 365m before meals and at bedtime, up to QID 10/02/22  Yes SqEppie GibsonMD  lidocaine-prilocaine (EMLA) cream Apply to affected area once Patient taking differently: Apply 1 Application topically daily as needed (burning). 10/02/22  Yes FeTruitt MerleMD  Mouthwashes (MOUTH RINSE) LIQD solution 15 mLs by Mouth Rinse route as needed (for oral care). 09/26/22  Yes Love, PaIvan AnchorsPA-C  OVER THE COUNTER MEDICATION Take 1 tablet by mouth daily. Protandim Supplement   Yes [provider]  prochlorperazine (COMPAZINE) 10 MG tablet Take 1 tablet (10 mg total) by mouth every 6 (six) hours as needed for nausea or vomiting. 10/02/22  Yes FeTruitt MerleMD  senna-docusate (SENOKOT-S) 8.6-50 MG tablet Take 1 tablet by mouth 2 (two) times daily. 09/26/22  Yes Love, PaIvan AnchorsPA-C  TYLENOL 500 MG tablet Take 1,000 mg by mouth in the morning and at bedtime.   Yes [provider]  ascorbic acid (VITAMIN C) 1000 MG tablet Take 1 tablet (1,000 mg total) by mouth  daily. Patient not taking: Reported on 10/12/2022 09/26/22   Love, PaIvan AnchorsPA-C     Family History  Problem Relation Age of Onset   Heart disease Mother    Pneumonia Father    Hyperlipidemia Sister    Melanoma Sister [id number]     hx of 5 melanomas   Lung cancer Maternal Uncle        mesothelioma   Pneumonia Maternal Uncle    Dementia Paternal Grandmother    Lung disease Paternal Grandfather    Breast cancer Other        MGMs sister dx < 506 Breast cancer Other        PGFs sister dx < 5043 Breast cancer Cousin        mother's mat first cousin, dx < 5089  Social History   Socioeconomic History   Marital status: Married    Spouse name: Not on file   Number of children: 0   Years of education: Not on file   Highest education  level: Not on file  Occupational History   Not on file  Tobacco Use   Smoking status: Every Day    Packs/day: 0.50    Years: 40.00    Total pack years: 20.00    Types: Cigarettes   Smokeless tobacco: Never  Vaping Use   Vaping Use: Former   Quit date: 09/27/2013  Substance and Sexual Activity   Alcohol use: Not Currently    Alcohol/week: 28.0 standard drinks of alcohol    Types: 28 Cans of beer per week    Comment: drinks 4 beers daily   Drug use: No   Sexual activity: Yes    Birth control/protection: Post-menopausal    Comment: endometrial ablasion  Other Topics Concern   Not on file  Social History Narrative   1 stepdaughter   Social Determinants of Health   Financial Resource Strain: Not on file  Food Insecurity: No Food Insecurity (09/04/2022)   Hunger Vital Sign    Worried About Running Out of Food in the Last Year: Never true    Ran Out of Food in the Last Year: Never true  Transportation Needs: No Transportation Needs (09/04/2022)   PRAPARE - Hydrologist (Medical): No    Lack of Transportation (Non-Medical): No  Physical Activity: Not on file  Stress: Not on file  Social Connections: Not on file       Review of Systems currently denies fever, headache, chest pain, worsening dyspnea, nausea, vomiting or bleeding.  She does have occasional cough and intermittent abdominal/back discomfort  Vital Signs: BP 135/73 (BP Location: Left Arm)   Pulse 93   Temp 98 F (36.7 C) (Oral)   Resp 16   Ht 5' 2"$  (1.575 m)   Wt 88 lb 13.5 oz (40.3 kg)   SpO2 100%   BMI 16.25 kg/m     Physical Exam: awake/alert; chest- sl dim BS bases; heart- RRR; abd- soft,few BS, some mild generalized tenderness to palpation; no LE edema  Imaging: ECHOCARDIOGRAM COMPLETE  Result Date: 10/15/2022    ECHOCARDIOGRAM REPORT   Patient Name:   ABRIANA GANGLOFF [facility] Date of Exam: 10/15/2022 Medical Rec #:  [id number]             Height:       62.0 in Accession #:    [id number]            Weight:       88.8 lb Date of Birth:  [birth year]             BSA:          1.354 m Patient Age:    70 years              BP:           119/75 mmHg Patient Gender: F                     HR:           90 bpm. Exam Location:  Inpatient Procedure: 2D Echo, 3D Echo, Cardiac Doppler, Color Doppler and Strain Analysis Indications:    chemo  History:        Patient has no prior history of Echocardiogram examinations.  Sonographer:    Phineas Douglas Referring Phys: [place]:[id number] [place]  1. Left ventricular ejection fraction, by estimation, is 55 to 60%. The left ventricle has normal function. The left ventricle has no regional wall motion abnormalities. Left  ventricular diastolic parameters are consistent with Grade I diastolic dysfunction (impaired relaxation). The average left ventricular global longitudinal strain is -14.9 %. The global longitudinal strain is abnormal.  2. Right ventricular systolic function is normal. The right ventricular size is normal. There is mildly elevated pulmonary artery systolic pressure.  3. The mitral valve is normal in structure. Trivial mitral valve regurgitation. No evidence of mitral stenosis.  4. The  aortic valve is tricuspid. Aortic valve regurgitation is not visualized. No aortic stenosis is present.  5. The inferior vena cava is normal in size with <50% respiratory variability, suggesting right atrial pressure of 8 mmHg. FINDINGS  Left Ventricle: Left ventricular ejection fraction, by estimation, is 55 to 60%. The left ventricle has normal function. The left ventricle has no regional wall motion abnormalities. The average left ventricular global longitudinal strain is -14.9 %. The global longitudinal strain is abnormal. The left ventricular internal cavity size was normal in size. There is no left ventricular hypertrophy. Left ventricular diastolic parameters are consistent with Grade I diastolic dysfunction (impaired relaxation). Indeterminate filling pressures. Right Ventricle: The right ventricular size is normal. No increase in right ventricular wall thickness. Right ventricular systolic function is normal. There is mildly elevated pulmonary artery systolic pressure. The tricuspid regurgitant velocity is 2.67  m/s, and with an assumed right atrial pressure of 8 mmHg, the estimated right ventricular systolic pressure is [id number] mmHg. Left Atrium: Left atrial size was normal in size. Right Atrium: Right atrial size was normal in size. Pericardium: There is no evidence of pericardial effusion. Mitral Valve: The mitral valve is normal in structure. Trivial mitral valve regurgitation. No evidence of mitral valve stenosis. Tricuspid Valve: The tricuspid valve is normal in structure. Tricuspid valve regurgitation is trivial. No evidence of tricuspid stenosis. Aortic Valve: The aortic valve is tricuspid. Aortic valve regurgitation is not visualized. No aortic stenosis is present. Pulmonic Valve: The pulmonic valve was normal in structure. Pulmonic valve regurgitation is not visualized. No evidence of pulmonic stenosis. Aorta: The aortic root is normal in size and structure. Venous: The inferior vena cava is normal  in size with less than 50% respiratory variability, suggesting right atrial pressure of 8 mmHg. IAS/Shunts: No atrial level shunt detected by color flow Doppler. Additional Comments: There is a small pleural effusion in the left lateral region.  LEFT VENTRICLE PLAX 2D LVIDd:         3.50 cm     Diastology LVIDs:         2.20 cm     LV e' medial:    6.31 cm/s LV PW:         1.10 cm     LV E/e' medial:  11.9 LV IVS:        1.00 cm     LV e' lateral:   9.68 cm/s LVOT diam:     1.80 cm     LV E/e' lateral: 7.7 LV SV:         46 LV SV Index:   34          2D Longitudinal Strain LVOT Area:     2.54 cm    2D Strain GLS (A2C):   -12.7 %                            2D Strain GLS (A3C):   -17.5 %  2D Strain GLS (A4C):   -14.6 % LV Volumes (MOD)           2D Strain GLS Avg:     -14.9 % LV vol d, MOD A2C: 68.3 ml LV vol d, MOD A4C: 79.9 ml LV vol s, MOD A2C: 24.8 ml LV vol s, MOD A4C: 31.9 ml 3D Volume EF: LV SV MOD A2C:     43.5 ml 3D EF:        64 % LV SV MOD A4C:     79.9 ml LV EDV:       113 ml LV SV MOD BP:      45.5 ml LV ESV:       41 ml                            LV SV:        72 ml RIGHT VENTRICLE             IVC RV Basal diam:  3.20 cm     IVC diam: 1.90 cm RV S prime:     14.00 cm/s TAPSE (M-mode): 2.0 cm LEFT ATRIUM             Index        RIGHT ATRIUM           Index LA diam:        2.40 cm 1.77 cm/m   RA Area:     12.60 cm LA Vol (A2C):   34.4 ml 25.40 ml/m  RA Volume:   27.20 ml  20.09 ml/m LA Vol (A4C):   38.6 ml 28.51 ml/m LA Biplane Vol: 38.1 ml 28.14 ml/m  AORTIC VALVE LVOT Vmax:   105.00 cm/s LVOT Vmean:  63.400 cm/s LVOT VTI:    0.182 m  AORTA Ao Root diam: 2.90 cm Ao Asc diam:  2.60 cm MITRAL VALVE               TRICUSPID VALVE MV Area (PHT): 3.23 cm    TR Peak grad:   28.5 mmHg MV Decel Time: 235 msec    TR Vmax:        267.00 cm/s MV E velocity: 75.00 cm/s MV A velocity: 91.70 cm/s  SHUNTS MV E/A ratio:  0.82        Systemic VTI:  0.18 m                             Systemic Diam: 1.80 cm Skeet Latch MD Electronically signed by Skeet Latch MD Signature Date/Time: 10/15/2022/1:44:12 PM    Final    MR BRAIN WO CONTRAST  Result Date: 10/12/2022 CLINICAL DATA:  Mental status change, unknown cause EXAM: MRI HEAD WITHOUT CONTRAST TECHNIQUE: Multiplanar, multiecho pulse sequences of the brain and surrounding structures were obtained without intravenous contrast. COMPARISON:  MRI head 09/13/2022. FINDINGS: Noncontrast and motion limited study.  Within these limitations: Brain: The lesion at the left CP angle is grossly similar. Additional enhancing lesions seen on the prior cannot be accurately assessed without contrast. Consider postcontrast imaging. No significant new mass effect. No obvious evidence of acute infarct, acute hemorrhage, midline shift, hydrocephalus or sizeable extra-axial fluid collection. Vascular: Major arterial flow voids are maintained at the skull base. Skull and upper cervical spine: Extensive osseous metastatic disease. Sinuses/Orbits: Clear sinuses.  No acute findings. Other: No mastoid effusions. IMPRESSION: 1. Noncontrast and motion  limited study. The lesion at the left CP angle is grossly similar. Additional enhancing lesions seen on the prior cannot be accurately assessed without contrast. Consider postcontrast imaging. 2. No significant new mass effect or other obvious acute abnormality. 3. Osseous metastatic disease. Electronically Signed   By: Margaretha Sheffield M.D.   On: 10/12/2022 12:35   CT Head Wo Contrast  Result Date: 10/12/2022 CLINICAL DATA:  Mental status change with unknown cause EXAM: CT HEAD WITHOUT CONTRAST TECHNIQUE: Contiguous axial images were obtained from the base of the skull through the vertex without intravenous contrast. RADIATION DOSE REDUCTION: This exam was performed according to the departmental dose-optimization program which includes automated exposure control, adjustment of the mA and/or kV according to  patient size and/or use of iterative reconstruction technique. COMPARISON:  Brain MRI 09/13/2022 FINDINGS: Brain: No evidence of acute infarction, hemorrhage, hydrocephalus, extra-axial collection or mass lesion/mass effect. Essentially occult metastatic disease to the brain. Reference postcontrast brain MRI 09/13/2022 Vascular: No hyperdense vessel or unexpected calcification. Skull: Heterogeneity of the calvarium from known metastatic disease. No acute osseous finding. Extensive cervical fusion which is minimally covered Sinuses/Orbits: No acute finding IMPRESSION: No acute or interval finding. Electronically Signed   By: Jorje Guild M.D.   On: 10/12/2022 05:36   DG CHEST PORT 1 VIEW  Result Date: 10/12/2022 CLINICAL DATA:  Altered mental status.  History of breast cancer EXAM: PORTABLE CHEST 1 VIEW COMPARISON:  PET CT 08/03/2022 FINDINGS: Hazy and streaky right density at the lung bases from scarring based on PET CT 08/03/2022. Trace right pleural effusion. Generous left ventricular contour extension weighted by rotation. Atheromatous calcification of the aorta. Postoperative breasts and left axilla. Scoliosis and spinal degeneration with cervical fusion. IMPRESSION: No acute finding when accounting for scarring at the lung bases with trace right pleural effusion. Electronically Signed   By: Jorje Guild M.D.   On: 10/12/2022 04:40    Labs:  CBC: Recent Labs    10/13/22 0628 10/14/22 0606 10/15/22 0632 10/16/22 0512  WBC 7.4 5.7 6.9 6.5  HGB 12.3 10.7* 11.0* 11.3*  HCT 36.6 31.5* 33.3* 33.7*  PLT 135* 117* 129* 131*    COAGS: Recent Labs    10/12/22 0652  INR 1.3*  APTT 38*    BMP: Recent Labs    10/13/22 0628 10/14/22 0606 10/15/22 0632 10/16/22 0512  NA 135 138 135 131*  K 2.7* 3.3* 3.3* 4.3  CL 97* 104 106 104  CO2 25 23 20* 21*  GLUCOSE 116* 123* 127* 126*  BUN 18 20 11 $ 5*  CALCIUM 12.2* 10.1 8.2* 7.2*  CREATININE 0.62 0.57 0.34* <0.30*  GFRNONAA >60 >60 >60  NOT CALCULATED    LIVER FUNCTION TESTS: Recent Labs    10/12/22 0445 10/14/22 0606 10/15/22 0632 10/16/22 0512  BILITOT 1.2 0.8 0.7 0.9  AST 102* 94* 93* 98*  ALT 29 28 31 $ 33  ALKPHOS 723* 737* 859* 940*  PROT 6.7 5.8* 5.8* 6.0*  ALBUMIN 3.8 3.4* 3.4* 3.4*    TUMOR MARKERS: No results for input(s): "AFPTM", "CEA", "CA199", "CHROMGRNA" in the last 8760 hours.  Assessment and Plan: 62 y.o. female with past medical history significant for anxiety, depression, hypothyroidism, and metastatic left breast cancer. She was originally scheduled for outpatient Port-A-Cath placement at [facility] on 10/17/22, however was admitted to [facility] on 2/15 with confusion and weakness felt to be likely from medication or hypercalcemia. Urine/blood cx neg to date. Pt afebrile.  WBC nl.  She is currently  alert and oriented.  Following discussion with oncology they would like port placed today in anticipation of chemotherapy this week.Risks and benefits of image guided port-a-catheter placement was discussed with the patient including, but not limited to bleeding, infection, pneumothorax, or fibrin sheath development and need for additional procedures.  All of the patient's questions were answered, patient is agreeable to proceed. Consent signed and in chart. Procedure scheduled for later today. She is on eliquis.     Thank you for this interesting consult.  I greatly enjoyed meeting Martha [name] and look forward to participating in their care.  A copy of this report was sent to the requesting provider on this date.  Electronically Signed: D. Rowe Robert, PA-C 10/16/2022, 10:58 AM   I spent a total of 25 minutes    in face to face in clinical consultation, greater than 50% of which was counseling/coordinating care for port a cath placement

## 2022-10-17 ENCOUNTER — Inpatient Hospital Stay (HOSPITAL_COMMUNITY)
Admission: RE | Admit: 2022-10-17 | Discharge: 2022-10-17 | Disposition: A | Discharge status: Home / Self Care | Payer: BC Managed Care – PPO | Source: Ambulatory Visit | Attending: Hematology | Admitting: Hematology

## 2022-10-17 ENCOUNTER — Ambulatory Visit: Payer: BC Managed Care – PPO

## 2022-10-17 DIAGNOSIS — C799 Secondary malignant neoplasm of unspecified site: Secondary | ICD-10-CM | POA: Diagnosis not present

## 2022-10-17 DIAGNOSIS — R443 Hallucinations, unspecified: Secondary | ICD-10-CM | POA: Diagnosis not present

## 2022-10-17 DIAGNOSIS — Z515 Encounter for palliative care: Secondary | ICD-10-CM | POA: Diagnosis not present

## 2022-10-17 LAB — CBC WITH DIFFERENTIAL/PLATELET
Abs Immature Granulocytes: 0.56 10*3/uL — ABNORMAL HIGH (ref 0.00–0.07)
Basophils Absolute: 0.1 10*3/uL (ref 0.0–0.1)
Basophils Relative: 1 %
Eosinophils Absolute: 0.1 10*3/uL (ref 0.0–0.5)
Eosinophils Relative: 1 %
HCT: 33.7 % — ABNORMAL LOW (ref 36.0–46.0)
Hemoglobin: 11.4 g/dL — ABNORMAL LOW (ref 12.0–15.0)
Immature Granulocytes: 8 %
Lymphocytes Relative: 5 %
Lymphs Abs: 0.4 10*3/uL — ABNORMAL LOW (ref 0.7–4.0)
MCH: 31.6 pg (ref 26.0–34.0)
MCHC: 33.8 g/dL (ref 30.0–36.0)
MCV: 93.4 fL (ref 80.0–100.0)
Monocytes Absolute: 0.6 10*3/uL (ref 0.1–1.0)
Monocytes Relative: 9 %
Neutro Abs: 5.7 10*3/uL (ref 1.7–7.7)
Neutrophils Relative %: 76 %
Platelets: 146 10*3/uL — ABNORMAL LOW (ref 150–400)
RBC: 3.61 MIL/uL — ABNORMAL LOW (ref 3.87–5.11)
RDW: 14.6 % (ref 11.5–15.5)
WBC: 7.4 10*3/uL (ref 4.0–10.5)
nRBC: 0 % (ref 0.0–0.2)

## 2022-10-17 LAB — COMPREHENSIVE METABOLIC PANEL
ALT: 36 U/L (ref 0–44)
AST: 96 U/L — ABNORMAL HIGH (ref 15–41)
Albumin: 3.1 g/dL — ABNORMAL LOW (ref 3.5–5.0)
Alkaline Phosphatase: 970 U/L — ABNORMAL HIGH (ref 38–126)
Anion gap: 11 (ref 5–15)
BUN: <5 mg/dL — ABNORMAL LOW (ref 8–23)
CO2: 23 mmol/L (ref 22–32)
Calcium: 8.1 mg/dL — ABNORMAL LOW (ref 8.9–10.3)
Chloride: 102 mmol/L (ref 98–111)
Creatinine, Ser: <0.3 mg/dL — ABNORMAL LOW (ref 0.44–1.00)
Glucose, Bld: 126 mg/dL — ABNORMAL HIGH (ref 70–99)
Potassium: 3 mmol/L — ABNORMAL LOW (ref 3.5–5.1)
Sodium: 136 mmol/L (ref 135–145)
Total Bilirubin: 1 mg/dL (ref 0.3–1.2)
Total Protein: 5.7 g/dL — ABNORMAL LOW (ref 6.5–8.1)

## 2022-10-17 LAB — CULTURE, BLOOD (ROUTINE X 2)
Culture: NO GROWTH
Culture: NO GROWTH
Special Requests: ADEQUATE

## 2022-10-17 LAB — MAGNESIUM
Magnesium: 2.2 mg/dL (ref 1.7–2.4)
Magnesium: 1.9 mg/dL (ref 1.7–2.4)

## 2022-10-17 LAB — PHOSPHORUS
Phosphorus: 2.4 mg/dL — ABNORMAL LOW (ref 2.5–4.6)
Phosphorus: 1.6 mg/dL — ABNORMAL LOW (ref 2.5–4.6)

## 2022-10-17 LAB — POTASSIUM: Potassium: 4.1 mmol/L (ref 3.5–5.1)

## 2022-10-17 MED ORDER — POTASSIUM CHLORIDE CRYS ER 20 MEQ PO TBCR
40.0000 meq | EXTENDED_RELEASE_TABLET | Freq: Once | ORAL | Status: AC
  Administered 2022-10-17: 40 meq via ORAL
  Filled 2022-10-17: qty 2

## 2022-10-17 MED ORDER — POTASSIUM CHLORIDE 10 MEQ/50ML IV SOLN
10.0000 meq | INTRAVENOUS | Status: AC
  Administered 2022-10-17 (×6): 10 meq via INTRAVENOUS
  Filled 2022-10-17 (×7): qty 50

## 2022-10-17 MED ORDER — SODIUM PHOSPHATES 45 MMOLE/15ML IV SOLN
30.0000 mmol | Freq: Once | INTRAVENOUS | Status: AC
  Administered 2022-10-17: 30 mmol via INTRAVENOUS
  Filled 2022-10-17: qty 10

## 2022-10-17 MED ORDER — POTASSIUM CHLORIDE 10 MEQ/100ML IV SOLN: 10.0000 meq | INTRAVENOUS | Status: DC

## 2022-10-17 NOTE — Progress Notes (Signed)
Daily Progress Note   Patient Name: Martha [name]       Date: 10/17/2022 DOB: 23-Jul-1961  Age: 62 y.o. MRN#: [id number] Attending Physician: Martha Bossier, MD Primary Care Physician: Martha Barrack, MD Admit Date: 10/12/2022 Length of Stay: 5 days  Reason for Consultation/Follow-up: Establishing goals of care and Symptom Management  Subjective:   CC: Patient notes abdominal pain improved with medication. Following up regarding complex medical decision making and symptom management.   Subjective:  At time of EMR review, patient is on scheduled Tylenol given orally, required only 1 dose of 5 mg of oxycodone in the past 24 hours.  Awake alert, resting in bed.  Discussed about symptom management.  Patient has not required Ativan lately.  States that this is a better morning.  She underwent port placement.  She feels some abdominal pressure-thinks she might have a bowel movement soon.  Overall, states that symptoms are reasonably well-controlled.  Patient has been transition from gabapentin to Lyrica, took 1 dose today.    All questions answered at that time. Thanked patient for allowing this provider to visit with her today.   Review of Systems  Objective:   Vital Signs:  BP (!) 143/77 (BP Location: Left Arm)   Pulse 96   Temp 98.2 F (36.8 C) (Oral)   Resp 16   Ht 5' 2"$  (1.575 m)   Wt 40.3 kg   SpO2 91%   BMI 16.25 kg/m   Physical Exam: General: NAD, alert, laying in bed, pleasant  Eyes: no drainage noted HENT: dry mucous membranes Cardiovascular: RRR, no edema in LE b/l Respiratory: no increased work of breathing noted, not in respiratory distress Abdomen: not distended Extremities: moving all appropriately  Skin: no rashes or lesions on visible skin Neuro: A&Ox4, following commands easily Psych: appropriately answers all questions  Imaging:  I personally reviewed recent imaging.   Assessment & Plan:   Assessment: Palliative Care consult requested for  goals of care discussion in this 62 y.o. female  with past medical history of ER+ breast cancer (01/2018), right breast cancer (04/2021), now with metastatic disease progression involving brain and liver, pathological fractures with osseous mets s/p brain radiation. She was admitted on 10/12/2022 from home with confusion and weakness. Recently discharged from [facility] on 1/21 after cervical spine surgery.    Recommendations/Plan: # Complex medical decision making/goals of care:  - DNR/DNI status was confirmed by patient and husband on 10/15/22.  - Continue with current plan of care. Patient and husband clear in expressed wishes to continue to treat the treatable aggressively. They are realistic in their understanding of current disease progression and poor long-term prognosis. Hopeful for the ability to continue to thrive and start chemotherapy.  - Patient previously stated that if she is unable to make medical decisions for herself, she would want her husband to make medical decision for her.   -  Code Status: DNR  # Symptom management:  -Pain, acute on chronic in setting on metastatic breat cancer continue Tylenol 17m q8hrs scheduled during the day -continue oxycodone 2.5-578mq4hrs prn   Lyrica 257maily in AM  took first dose in am on 2/20   -Anxiety/Depression   -  po Ativan 0.5mg58mhrs prn   -Continue Celexa 40mg76mly   -Constipation   -Miralax as needed   -Continue Senna 1 tab twice daily   -Nausea   -Zofran as needed  # Discharge Planning: TBD  -Needs follow up with outpatient PMT at  [facility]. Referral ordered; will need set up at time of discharge.   Discussed with: IDT  Thank you for allowing the palliative care team to participate in the care Martha [name]. Mod MDM.  Martha Aus, DO Palliative Care Provider PMT # [phone]  If patient remains symptomatic despite maximum doses, please call PMT at [phone] between 0700 and 1900. Outside of these hours, please call  attending, as PMT does not have night coverage.

## 2022-10-17 NOTE — Progress Notes (Signed)
Martha [name]   DOB:Dec 03, 1960   [id number]   [id number]  Medical oncology follow-up  Subjective: Patient is overall doing better, has not had hallucination in the past few days since her pain medication were adjusted by palliative care providers.  She had a port placement yesterday, still little sore at his incision site.  Objective:  Vitals:   10/16/22 1942 10/17/22 0447  BP: 114/69 (!) 143/77  Pulse: 84 96  Resp: 14 16  Temp: 98.2 F (36.8 C) 98.2 F (36.8 C)  SpO2: 96% 91%    Body mass index is 16.25 kg/m.  Intake/Output Summary (Last 24 hours) at 10/17/2022 1343 Last data filed at 10/17/2022 1100 Gross per 24 hour  Intake --  Output 601 ml  Net -601 ml     Sclerae unicteric  Oropharynx clear  (+) cervical collar   Neuro nonfocal    CBG (last 3)  No results for input(s): "GLUCAP" in the last 72 hours.   Labs:  Urine Studies No results for input(s): "UHGB", "CRYS" in the last 72 hours.  Invalid input(s): "UACOL", "UAPR", "USPG", "UPH", "UTP", "UGL", "UKET", "UBIL", "UNIT", "UROB", "ULEU", "UEPI", "UWBC", "URBC", "UBAC", "CAST", "UCOM", "BILUA"  Basic Metabolic Panel: Recent Labs  Lab 10/13/22 0600 10/13/22 0628 10/14/22 0606 10/15/22 [id number] 10/16/22 0512 10/16/22 1211 10/17/22 0648  NA  --    < > 138 135 131* 136 136  K  --    < > 3.3* 3.3* 4.3 3.8 3.0*  CL  --    < > 104 106 104 105 102  CO2  --    < > 23 20* 21* 22 23  GLUCOSE  --    < > 123* 127* 126* 120* 126*  BUN  --    < > 20 11 5* <5* <5*  CREATININE  --    < > 0.57 0.34* <0.30* 0.39* <0.30*  CALCIUM  --    < > 10.1 8.2* 7.2* 7.6* 8.1*  MG 2.3  --  1.8 1.9 2.5*  --  2.2  PHOS 2.6  --  1.9* 1.7* 3.2  --  2.4*   < > = values in this interval not displayed.   GFR CrCl cannot be calculated (This lab value cannot be used to calculate CrCl because it is not a number: <0.30). Liver Function Tests: Recent Labs  Lab 10/14/22 0606 10/15/22 [id number] 10/16/22 0512 10/16/22 1211  10/17/22 0648  AST 94* 93* 98* 102* 96*  ALT 28 31 33 34 36  ALKPHOS 737* 859* 940* 977* 970*  BILITOT 0.8 0.7 0.9 0.7 1.0  PROT 5.8* 5.8* 6.0* 5.6* 5.7*  ALBUMIN 3.4* 3.4* 3.4* 3.3* 3.1*   No results for input(s): "LIPASE", "AMYLASE" in the last 168 hours. Recent Labs  Lab 10/12/22 0652  AMMONIA 23   Coagulation profile Recent Labs  Lab 10/12/22 0652 10/16/22 1211  INR 1.3* 1.4*    CBC: Recent Labs  Lab 10/13/22 0628 10/14/22 0606 10/15/22 0632 10/16/22 0512 10/16/22 1211 10/17/22 0648  WBC 7.4 5.7 6.9 6.5 6.9 7.4  NEUTROABS 5.8 4.9 6.1 5.2  --  5.7  HGB 12.3 10.7* 11.0* 11.3* 11.5* 11.4*  HCT 36.6 31.5* 33.3* 33.7* 35.2* 33.7*  MCV 93.4 94.0 95.4 93.6 96.4 93.4  PLT 135* 117* 129* 131* 125* 146*   Cardiac Enzymes: No results for input(s): "CKTOTAL", "CKMB", "CKMBINDEX", "TROPONINI" in the last 168 hours. BNP: Invalid input(s): "POCBNP" CBG: No results for input(s): "GLUCAP" in the last 168 hours. D-Dimer No  results for input(s): "DDIMER" in the last 72 hours. Hgb A1c No results for input(s): "HGBA1C" in the last 72 hours. Lipid Profile No results for input(s): "CHOL", "HDL", "LDLCALC", "TRIG", "CHOLHDL", "LDLDIRECT" in the last 72 hours. Thyroid function studies No results for input(s): "TSH", "T4TOTAL", "T3FREE", "THYROIDAB" in the last 72 hours.  Invalid input(s): "FREET3"  Anemia work up No results for input(s): "VITAMINB12", "FOLATE", "FERRITIN", "TIBC", "IRON", "RETICCTPCT" in the last 72 hours. Microbiology Recent Results (from the past 240 hour(s))  Resp panel by RT-PCR (RSV, Flu A&B, Covid) Anterior Nasal Swab     Status: None   Collection Time: 10/12/22  4:35 AM   Specimen: Anterior Nasal Swab  Result Value Ref Range Status   SARS Coronavirus 2 by RT PCR NEGATIVE NEGATIVE Final    Comment: (NOTE) SARS-CoV-2 target nucleic acids are NOT DETECTED.  The SARS-CoV-2 RNA is generally detectable in upper respiratory specimens during the acute  phase of infection. The lowest concentration of SARS-CoV-2 viral copies this assay can detect is 138 copies/mL. A negative result does not preclude SARS-Cov-2 infection and should not be used as the sole basis for treatment or other patient management decisions. A negative result may occur with  improper specimen collection/handling, submission of specimen other than nasopharyngeal swab, presence of viral mutation(s) within the areas targeted by this assay, and inadequate number of viral copies(<138 copies/mL). A negative result must be combined with clinical observations, patient history, and epidemiological information. The expected result is Negative.  Fact Sheet for Patients:  EntrepreneurPulse.com.au  Fact Sheet for Healthcare Providers:  IncredibleEmployment.be  This test is no t yet approved or cleared by the [place] FDA and  has been authorized for detection and/or diagnosis of SARS-CoV-2 by FDA under an Emergency Use Authorization (EUA). This EUA will remain  in effect (meaning this test can be used) for the duration of the COVID-19 declaration under Section 564(b)(1) of the Act, 21 U.S.C.section 360bbb-3(b)(1), unless the authorization is terminated  or revoked sooner.       Influenza A by PCR NEGATIVE NEGATIVE Final   Influenza B by PCR NEGATIVE NEGATIVE Final    Comment: (NOTE) The Xpert Xpress SARS-CoV-2/FLU/RSV plus assay is intended as an aid in the diagnosis of influenza from Nasopharyngeal swab specimens and should not be used as a sole basis for treatment. Nasal washings and aspirates are unacceptable for Xpert Xpress SARS-CoV-2/FLU/RSV testing.  Fact Sheet for Patients: EntrepreneurPulse.com.au  Fact Sheet for Healthcare Providers: IncredibleEmployment.be  This test is not yet approved or cleared by the [place] FDA and has been authorized for detection and/or diagnosis of  SARS-CoV-2 by FDA under an Emergency Use Authorization (EUA). This EUA will remain in effect (meaning this test can be used) for the duration of the COVID-19 declaration under Section 564(b)(1) of the Act, 21 U.S.C. section 360bbb-3(b)(1), unless the authorization is terminated or revoked.     Resp Syncytial Virus by PCR NEGATIVE NEGATIVE Final    Comment: (NOTE) Fact Sheet for Patients: EntrepreneurPulse.com.au  Fact Sheet for Healthcare Providers: IncredibleEmployment.be  This test is not yet approved or cleared by the [place] FDA and has been authorized for detection and/or diagnosis of SARS-CoV-2 by FDA under an Emergency Use Authorization (EUA). This EUA will remain in effect (meaning this test can be used) for the duration of the COVID-19 declaration under Section 564(b)(1) of the Act, 21 U.S.C. section 360bbb-3(b)(1), unless the authorization is terminated or revoked.  Performed at [facility], [place]  [address] [place], [place] [address]   Blood Culture (routine x 2)     Status: None   Collection Time: 10/12/22  4:35 AM   Specimen: BLOOD  Result Value Ref Range Status   Specimen Description   Final    BLOOD LEFT ANTECUBITAL Performed at [facility] [address] [place], [place] [address]    Special Requests   Final    BOTTLES DRAWN AEROBIC AND ANAEROBIC Blood Culture adequate volume Performed at [facility] [address], [place], [place] [address]    Culture   Final    NO GROWTH 5 DAYS Performed at [facility], [place] [address], [place], [place] [address]    Report Status 10/17/2022 FINAL  Final  Blood Culture (routine x 2)     Status: None   Collection Time: 10/12/22  4:45 AM   Specimen: BLOOD  Result Value Ref Range Status   Specimen Description   Final    BLOOD RIGHT ANTECUBITAL Performed at [facility] [address]  [place], [place] [address]    Special Requests   Final    BOTTLES DRAWN AEROBIC AND ANAEROBIC Blood Culture results may not be optimal due to an excessive volume of blood received in culture bottles Performed at [facility] [address], [place], [place] [address]    Culture   Final    NO GROWTH 5 DAYS Performed at [facility], [place] [address] [place], [place] [address]    Report Status 10/17/2022 FINAL  Final  Urine Culture (for pregnant, neutropenic or urologic patients or patients with an indwelling urinary catheter)     Status: None   Collection Time: 10/12/22  6:43 AM   Specimen: Urine, Clean Catch  Result Value Ref Range Status   Specimen Description   Final    URINE, CLEAN CATCH Performed at [facility], [place] [address], [place], [place] [address]    Special Requests   Final    NONE Performed at [facility], [place] [address], [place], [place] [address]    Culture   Final    NO GROWTH Performed at [facility], [place] [address], [place], [place] [address]    Report Status 10/13/2022 FINAL  Final      Studies:  IR IMAGING GUIDED PORT INSERTION  Result Date: 10/17/2022 INDICATION: 62 year old female with widespread metastatic breast cancer. She presents for establishment of durable venous access. EXAM: IMPLANTED PORT A CATH PLACEMENT WITH ULTRASOUND AND FLUOROSCOPIC GUIDANCE MEDICATIONS: None. ANESTHESIA/SEDATION: Versed 2 mg IV; Fentanyl 100 mcg IV; Moderate Sedation Time:  15 minutes The patient's vital signs and level of consciousness were continuously monitored during the procedure by the interventional radiology nurse under my direct supervision. FLUOROSCOPY: Radiation exposure index: 1 mGy reference air kerma COMPLICATIONS: None immediate. PROCEDURE: The right neck and chest was prepped with chlorhexidine, and draped in the usual sterile fashion using maximum barrier technique (cap and mask, sterile gown,  sterile gloves, large sterile sheet, hand hygiene and cutaneous antiseptic). Local anesthesia was attained by infiltration with 1% lidocaine with epinephrine. Ultrasound demonstrated patency of the right internal jugular vein, and this was documented with an image. Under real-time ultrasound guidance, this vein was accessed with a 21 gauge micropuncture needle and image documentation was performed. A small dermatotomy was made at the access site with an 11 scalpel. A 0.018" wire was advanced into the SVC and the access  needle exchanged for a 71F micropuncture vascular sheath. The 0.018" wire was then removed and a 0.035" wire advanced into the IVC. An appropriate location for the subcutaneous reservoir was selected below the clavicle and an incision was made through the skin and underlying soft tissues. The subcutaneous tissues were then dissected using a combination of blunt and sharp surgical technique and a pocket was formed. A low-profile single lumen power injectable portacatheter was then tunneled through the subcutaneous tissues from the pocket to the dermatotomy and the port reservoir placed within the subcutaneous pocket. The venous access site was then serially dilated and a peel away vascular sheath placed over the wire. The wire was removed and the port catheter advanced into position under fluoroscopic guidance. The catheter tip is positioned in the superior cavoatrial junction. This was documented with a spot image. The portacatheter was then tested and found to flush and aspirate well. The port was flushed with saline followed by 100 units/mL heparinized saline. The pocket was then closed in two layers using first subdermal inverted interrupted absorbable sutures followed by a running subcuticular suture. The epidermis was then sealed with Dermabond. The dermatotomy at the venous access site was also closed with Dermabond. IMPRESSION: Successful placement of a right IJ approach Power Port with  ultrasound and fluoroscopic guidance. The catheter is ready for use. Electronically Signed   By: Jacqulynn Cadet M.D.   On: 10/17/2022 08:06   DG Chest 2 View  Result Date: 10/16/2022 CLINICAL DATA:  Abdominal pain EXAM: CHEST - 2 VIEW COMPARISON:  Chest radiograph dated 10/12/2022 FINDINGS: Low lung volumes. Bilateral lower lung hazy and patchy opacities with dense left retrocardiac opacities. Small bilateral pleural effusions. No pneumothorax. The heart size and mediastinal contours are within normal limits. Cervical spinal fixation hardware appears intact. Asymmetrically elevated left scapula, likely related to patient positioning. Surgical clip projects over the left upper quadrant. IMPRESSION: 1. Bilateral lower lung hazy and patchy opacities with dense left retrocardiac opacities, which could represent atelectasis, infection, or aspiration. 2. Small bilateral pleural effusions. Electronically Signed   By: Darrin Nipper M.D.   On: 10/16/2022 15:53    Assessment: 62 y.o. female   Hallucination and confusion, possible related to medication or hypercalciemia Metastatic breast cancer to bones, ER and PR negative, HER2 positive, status post spinal surgery twice, just finished palliative radiation to cervical spine. Depression Cancer-related pain.    Plan:  -Her pain medications have been adjusted by palliative care team, appreciate their assistance -She is scheduled to start chemotherapy in 2 days in our office, since she is still in hospital, I will cancel that, plan to start her chemotherapy next Thursday on 2/29. Echo reviewed, normal EF.  -I will f/u as needed before her discharge.    Truitt Merle, MD 10/17/2022  1:43 PM

## 2022-10-17 NOTE — Progress Notes (Addendum)
PROGRESS NOTE    Martha [name]  [id number] DOB: Sep 25, 1960 DOA: 10/12/2022 PCP: [name], MD     Brief Narrative:   62 y.o. WF PMHx anxiety, depression, dysrhythmia, ER positive breast cancer diagnosed 12/2017 with metastasis to cervical spine and brain discovered in July 2023   Admitted to the hospital for evaluation and management of confusion and weakness.  The patient was recently discharged from [name] inpatient rehab on 1/31.  She is currently undergoing radiation to her brain.  She has some chronic dizziness, and sensation of falling out of the bed, husband states that this has been going on for several weeks especially after her more recent cervical spine surgery in January 2024.  This problem seems to come and go, has been worse in the last 2 to 3 days.  Patient's husband also states that she has been somewhat confused and having some hallucinations, for example seeing people in her room and even this morning here in the hospital who are not present.  She has never really had this problem before, started in the last 3 to 4 days.  Per the patient and her husband, it seems that this confusion is somewhat transient, does not last very long.  While she was in rehab, her Celexa dose was increased to 40 mg, her gabapentin was titrated up to 200 mg p.o. 3 times daily.  There have been no other significant medication changes, and there has not been any medication changes since discharge from rehab.  Overall patient and her husband feel that she has been doing well considering what she has been through, she continues to ambulate with a walker, she is supposed to start outpatient therapy soon.   ED Course: Husband called EMS and the patient was brought to the emergency department for evaluation, where she has unremarkable vital signs and is stable on room air.  So far lab work reveals normal white blood cell count, normal hemoglobin, platelets 128.  Sodium 132, potassium 3.4.   Renal function is normal.  Calcium 12.7.  Lactate is normal at 1.4.   Subjective: 2/20 afebrile overnight, S/P placement RIGHT IJ single-lumen PowerPort   Assessment & Plan: Covid vaccination;   Principal Problem:   Hallucination Active Problems:   Malignant neoplasm of lower-inner quadrant of left breast in female, estrogen receptor positive ([name])   Cervicalgia   Cancer, metastatic to bone ([name])   Medication management   Change in mental status   AMS (altered mental status)   Depression   Cancer associated pain   DNR (do not resuscitate)   High risk medication use   Goals of care, counseling/discussion   Palliative care encounter  Malignant neoplasm of lower-inner quadrant of LEFT breast, ER positive  -Patient sees Dr. Burr Medico oncologist and Dr. Eppie Gibson radiation oncologist -Dx 12/2017 - S/p LEFT breast lumpectomy and adjuvant XRT - Letrozole started 05/2018 - lost f/u after visit in 04/2020 until her recurrence in 07/2022     Cancer, metastatic to bone  -07/24/2022 MRI C-spine C-spine mets see below extensive osseous metastatic disease with pathologic fracture at the base of dens and C2 right lateral mass. Extraosseous tumor at C1 and C2 likely impinging on the right C2 and C3 nerve roots.  -08/03/2022 PET scan diffuse bone mets -08/17/2022 cervical laminectomy and fusion by Dr. Marcello Moores biopsy confirmed metastatic breast cancer, ER/PR negative, HER2 positive. -08/17/2022 due to cervical cord compression with myelopathy, she underwent a second cervical spine surgery on September 06, 2022 -  S/p brain XRT -S/p chemotherapy -2/16 per Dr.Feng oncology note from 2/15 treatment plan is as follows  -lab and brain MRI reviewed, unremarkable except calcium is elevated 12.7 today, which can contributed to her confusion. -will give her a doze of zometa 57m  -Her gabapentin has been dose reduced, she is tolerating Norco well,  -Urine and blood culture are pending -Patient is scheduled  to start chemotherapy next week.   -2/16 Discussed case with Dr. SEppie Gibsonradiation oncologist revised XRT plan.  Now will only receive 1 dose of L-spine XRT today. -2/17 completed XRT treatment x 1 states back pain 3/10 today -2/18 has agreed to have port placed, and began chemotherapy on Thursday with Dr.Feng oncology. - 2/18 attempt to have IR placed port in the A.m. here.  If not we will need to arrange transport for [facility] to [facility](has appointment on Tuesday at 1400) -2/19 Dr. HJacqulynn Cadet IR agreed to place port today -2/20 s/p right chest wall port placement.  Altered mental status - Most likely multifactorial, brain mets, pain management medication, probably severe protein calorie malnutrition, possible infection. - 2/15 blood NGTD -2/15 urine negative -2/16 patient continues to have waxing and waning hallucinations (sees her nephew) -2/16 ammonia, TSH WNL -2/16 will consult PT/OT in a.m. -2/16 decrease Gabapentin 100 mg TID -2/17 patient appears to be A/O x 4 but having episodes of expressive aphasia -2/18 patient cognition appears worse in the day, but per husband recently received Ativan for agitation..  Depression - Celexa  Hypokalemia - Potassium goal> 4 - 2/16 K-Phos 30 mmol - 2/16 K-Dur 40 mEq -2/17 K-Phos 30 mmol - 2/17 K-Dur 40 mEq --2/18 K-Phos 30 mmol --2/18 Potassium 548m -2/20 potassium IV 60 mEq -2/20 K-Dur 40 mEq -2/20 repeat K/Mg/pO4  Hypomagnesmia - Magnesium goal> 2 - 2/17 MagnesiumIV 2 g --2/18 Magnesium IV 3 g  Phosphatemia - Phosphorus goal> -2/18 see HypoKalemia -2/20 post repeat K/Mg/pO4.  pO4 remains low - 2/20 sodium phosphate 30 mmol   Severe protein calorie malnutrition -2/16 D5-0.9% saline 75100mr - 2/16 nutrition consult   Goals of care - 2/17 Palliative Care consult: Patient and family would like to speak with palliative care concerning options.      Mobility Assessment (last 72 hours)     Mobility  Assessment     Row Name 10/16/22 0825 10/15/22 1100 10/14/22 2010 10/14/22 1238     Does patient have an order for bedrest or is patient medically unstable No - Continue assessment No - Continue assessment No - Continue assessment --    What is the highest level of mobility based on the progressive mobility assessment? Level 4 (Walks with assist in room) - Balance while marching in place and cannot step forward and back - Complete Level 4 (Walks with assist in room) - Balance while marching in place and cannot step forward and back - Complete Level 4 (Walks with assist in room) - Balance while marching in place and cannot step forward and back - Complete Level 4 (Walks with assist in room) - Balance while marching in place and cannot step forward and back - Complete    Is the above level different from baseline mobility prior to current illness? Yes - Recommend PT order Yes - Recommend PT order Yes - Recommend PT order --                   DVT prophylaxis: Eliquis Code Status: DNR Family Communication: 2/18 DonElenore Rotausband), and  sister at bedside discussed plan of care all questions answered.   Status is: Inpatient    Dispo: The patient is from: Home              Anticipated d/c is to: SNF??              Anticipated d/c date is: 3 days              Patient currently is not medically stable to d/c.      Consultants:  Dr. Eppie Gibson radiation oncology   Procedures/Significant Events:  07/24/2022 MRI C-spine  extensive osseous metastatic disease with pathologic fracture at the base of dens and C2 right lateral mass. Extraosseous tumor at C1 and C2 likely impinging on the right C2 and C3 nerve roots. 09/13/2022 MRI brain W/W0 contrast; 22 enhancing lesions are identified in the current study, annotated on series 1100. New lesions are marked with double arrows.   New lesions:   Right cerebellar hemisphere, 1 mm, image 122.   Left cerebellar hemisphere, 1 mm, image 139    Enlarged lesions:   Left cerebellar floccule, 12 mm (10 mm on prior), image 127   Right cerebellar hemisphere, 5 mm (3 mm on prior), image 122   Right occipital lobe: 4 mm (3 mm on prior), image 146   Left occipital lobe, 3 mm (2 mm on prior), image 159   Left occipital lobe, 7 mm (5 mm on prior), image 161   Left frontal operculum, 2 mm (1 mm on prior) image 172   Left occipital lobe, 2 mm (1 mm on prior), image 173   Right subinsular region, 5 mm (3 mm on prior) image 176   Right posterior temporal, 3 mm) 1 mm on prior), image 177   Left anterior cingulate, 3 mm (2 mm on prior), image 193   Left posterior frontal, 2.5 mm (1.5 mm on prior) image 195   Right parietal, 4 mm (3 mm on prior), image 196   Left parietal, 1.5 mm (less than 1 mm on prior), image 200   Left frontal lobe, 2 mm (less than 1 mm on prior), image 203   Left parietal lobe, 2 mm (less than 1 mm on prior), image 213   Right frontal lobe, 1.5 mm (less than 1 mm on prior), image 222   Unchanged lesions:   Right cerebellar hemisphere, 1 mm, image 122   Right temporal lobe, 3 mm, image 145   Left posterior temporal, 3 mm, image 184   Left periventricular, 2 mm, image 192   Mild surrounding edema is seen associated with the the 7 mm medial left occipital lobe lesion. No significant vasogenic edema associated with the other lesions. 2/16 MRI Brain wo contrast;The lesion at the left CP angle is grossly similar. Additional enhancing lesions seen on the prior cannot be accurately assessed without contrast. Consider postcontrast imaging. 2. No significant new mass effect or other obvious acute abnormality. 3. Osseous metastatic disease. 2/19 S/P placement RIGHT IJ single-lumen PowerPort   I have personally reviewed and interpreted all radiology studies and my findings are as above.  VENTILATOR SETTINGS:    Cultures   Antimicrobials:    Devices    LINES / TUBES:      Continuous  Infusions:   ceFAZolin (ANCEF) IV     dextrose 5 % and 0.9% NaCl 75 mL/hr at 10/16/22 1819     Objective: Vitals:   10/16/22 1815 10/16/22 1845 10/16/22 1942 10/17/22  0447  BP: 106/63 113/67 114/69 (!) 143/77  Pulse: 84 86 84 96  Resp: 14  14 16  $ Temp: 98.5 F (36.9 C)  98.2 F (36.8 C) 98.2 F (36.8 C)  TempSrc: Oral  Oral Oral  SpO2: 94% 94% 96% 91%  Weight:      Height:        Intake/Output Summary (Last 24 hours) at 10/17/2022 0847 Last data filed at 10/16/2022 2045 Gross per 24 hour  Intake --  Output 1200 ml  Net -1200 ml    Filed Weights   10/12/22 0603  Weight: 40.3 kg   Physical Exam:  General: A/O x 4, No acute respiratory distress Eyes: negative scleral hemorrhage, negative anisocoria, negative icterus ENT: Negative Runny nose, negative gingival bleeding, Neck:  Negative scars, masses, torticollis, lymphadenopathy, JVD Lungs: Clear to auscultation bilaterally without wheezes or crackles, port to right wall place covered in clean Cardiovascular: Regular rate and rhythm without murmur gallop or rub normal S1 and S2 Abdomen: negative abdominal pain, nondistended, positive soft, bowel sounds, no rebound, no ascites, no appreciable mass Extremities: No significant cyanosis, clubbing, or edema bilateral lower extremities Skin: Negative rashes, lesions, ulcers Psychiatric:  Negative depression, negative anxiety, negative fatigue, negative mania  Central nervous system:  Cranial nerves II through XII intact, tongue/uvula midline, all extremities muscle strength 5/5, sensation intact throughout, negative dysarthria, negative expressive aphasia, negative receptive aphasia.     .     Data Reviewed: Care during the described time interval was provided by me .  I have reviewed this patient's available data, including medical history, events of note, physical examination, and all test results as part of my evaluation.  CBC: Recent Labs  Lab 10/13/22 0628  10/14/22 0606 10/15/22 [id number] 10/16/22 0512 10/16/22 1211 10/17/22 0648  WBC 7.4 5.7 6.9 6.5 6.9 7.4  NEUTROABS 5.8 4.9 6.1 5.2  --  5.7  HGB 12.3 10.7* 11.0* 11.3* 11.5* 11.4*  HCT 36.6 31.5* 33.3* 33.7* 35.2* 33.7*  MCV 93.4 94.0 95.4 93.6 96.4 93.4  PLT 135* 117* 129* 131* 125* 146*    Basic Metabolic Panel: Recent Labs  Lab 10/13/22 0600 10/13/22 0628 10/14/22 0606 10/15/22 [id number] 10/16/22 0512 10/16/22 1211 10/17/22 0648  NA  --    < > 138 135 131* 136 136  K  --    < > 3.3* 3.3* 4.3 3.8 3.0*  CL  --    < > 104 106 104 105 102  CO2  --    < > 23 20* 21* 22 23  GLUCOSE  --    < > 123* 127* 126* 120* 126*  BUN  --    < > 20 11 5* <5* <5*  CREATININE  --    < > 0.57 0.34* <0.30* 0.39* <0.30*  CALCIUM  --    < > 10.1 8.2* 7.2* 7.6* 8.1*  MG 2.3  --  1.8 1.9 2.5*  --  2.2  PHOS 2.6  --  1.9* 1.7* 3.2  --  2.4*   < > = values in this interval not displayed.    GFR: CrCl cannot be calculated (This lab value cannot be used to calculate CrCl because it is not a number: <0.30). Liver Function Tests: Recent Labs  Lab 10/14/22 0606 10/15/22 [id number] 10/16/22 0512 10/16/22 1211 10/17/22 0648  AST 94* 93* 98* 102* 96*  ALT 28 31 33 34 36  ALKPHOS 737* 859* 940* 977* 970*  BILITOT 0.8 0.7 0.9 0.7 1.0  PROT 5.8* 5.8* 6.0* 5.6* 5.7*  ALBUMIN 3.4* 3.4* 3.4* 3.3* 3.1*    No results for input(s): "LIPASE", "AMYLASE" in the last 168 hours. Recent Labs  Lab 10/12/22 0652  AMMONIA 23    Coagulation Profile: Recent Labs  Lab 10/12/22 0652 10/16/22 1211  INR 1.3* 1.4*    Cardiac Enzymes: No results for input(s): "CKTOTAL", "CKMB", "CKMBINDEX", "TROPONINI" in the last 168 hours. BNP (last 3 results) No results for input(s): "PROBNP" in the last 8760 hours. HbA1C: No results for input(s): "HGBA1C" in the last 72 hours. CBG: No results for input(s): "GLUCAP" in the last 168 hours. Lipid Profile: No results for input(s): "CHOL", "HDL", "LDLCALC", "TRIG", "CHOLHDL",  "LDLDIRECT" in the last 72 hours. Thyroid Function Tests: No results for input(s): "TSH", "T4TOTAL", "FREET4", "T3FREE", "THYROIDAB" in the last 72 hours.  Anemia Panel: No results for input(s): "VITAMINB12", "FOLATE", "FERRITIN", "TIBC", "IRON", "RETICCTPCT" in the last 72 hours. Sepsis Labs: Recent Labs  Lab 10/12/22 0445 10/12/22 0652  PROCALCITON  --  0.10  LATICACIDVEN 1.4  --      Recent Results (from the past 240 hour(s))  Resp panel by RT-PCR (RSV, Flu A&B, Covid) Anterior Nasal Swab     Status: None   Collection Time: 10/12/22  4:35 AM   Specimen: Anterior Nasal Swab  Result Value Ref Range Status   SARS Coronavirus 2 by RT PCR NEGATIVE NEGATIVE Final    Comment: (NOTE) SARS-CoV-2 target nucleic acids are NOT DETECTED.  The SARS-CoV-2 RNA is generally detectable in upper respiratory specimens during the acute phase of infection. The lowest concentration of SARS-CoV-2 viral copies this assay can detect is 138 copies/mL. A negative result does not preclude SARS-Cov-2 infection and should not be used as the sole basis for treatment or other patient management decisions. A negative result may occur with  improper specimen collection/handling, submission of specimen other than nasopharyngeal swab, presence of viral mutation(s) within the areas targeted by this assay, and inadequate number of viral copies(<138 copies/mL). A negative result must be combined with clinical observations, patient history, and epidemiological information. The expected result is Negative.  Fact Sheet for Patients:  EntrepreneurPulse.com.au  Fact Sheet for Healthcare Providers:  IncredibleEmployment.be  This test is no t yet approved or cleared by the [place] FDA and  has been authorized for detection and/or diagnosis of SARS-CoV-2 by FDA under an Emergency Use Authorization (EUA). This EUA will remain  in effect (meaning this test can be used) for  the duration of the COVID-19 declaration under Section 564(b)(1) of the Act, 21 U.S.C.section 360bbb-3(b)(1), unless the authorization is terminated  or revoked sooner.       Influenza A by PCR NEGATIVE NEGATIVE Final   Influenza B by PCR NEGATIVE NEGATIVE Final    Comment: (NOTE) The Xpert Xpress SARS-CoV-2/FLU/RSV plus assay is intended as an aid in the diagnosis of influenza from Nasopharyngeal swab specimens and should not be used as a sole basis for treatment. Nasal washings and aspirates are unacceptable for Xpert Xpress SARS-CoV-2/FLU/RSV testing.  Fact Sheet for Patients: EntrepreneurPulse.com.au  Fact Sheet for Healthcare Providers: IncredibleEmployment.be  This test is not yet approved or cleared by the [place] FDA and has been authorized for detection and/or diagnosis of SARS-CoV-2 by FDA under an Emergency Use Authorization (EUA). This EUA will remain in effect (meaning this test can be used) for the duration of the COVID-19 declaration under Section 564(b)(1) of the Act, 21 U.S.C. section 360bbb-3(b)(1), unless the authorization is terminated or  revoked.     Resp Syncytial Virus by PCR NEGATIVE NEGATIVE Final    Comment: (NOTE) Fact Sheet for Patients: EntrepreneurPulse.com.au  Fact Sheet for Healthcare Providers: IncredibleEmployment.be  This test is not yet approved or cleared by the [place] FDA and has been authorized for detection and/or diagnosis of SARS-CoV-2 by FDA under an Emergency Use Authorization (EUA). This EUA will remain in effect (meaning this test can be used) for the duration of the COVID-19 declaration under Section 564(b)(1) of the Act, 21 U.S.C. section 360bbb-3(b)(1), unless the authorization is terminated or revoked.  Performed at [facility], [place] [address] [place], [place] [address]   Blood Culture (routine x 2)     Status: None    Collection Time: 10/12/22  4:35 AM   Specimen: BLOOD  Result Value Ref Range Status   Specimen Description   Final    BLOOD LEFT ANTECUBITAL Performed at [place] [address], [place], [place] [address]    Special Requests   Final    BOTTLES DRAWN AEROBIC AND ANAEROBIC Blood Culture adequate volume Performed at [place] [address] [place], [place] [address]    Culture   Final    NO GROWTH 5 DAYS Performed at [facility], [place] [address] [place], [place] [address]    Report Status 10/17/2022 FINAL  Final  Blood Culture (routine x 2)     Status: None   Collection Time: 10/12/22  4:45 AM   Specimen: BLOOD  Result Value Ref Range Status   Specimen Description   Final    BLOOD RIGHT ANTECUBITAL Performed at [place] [address] [place], [place] [address]    Special Requests   Final    BOTTLES DRAWN AEROBIC AND ANAEROBIC Blood Culture results may not be optimal due to an excessive volume of blood received in culture bottles Performed at [place] [address] [place], [place] [address]    Culture   Final    NO GROWTH 5 DAYS Performed at [facility], [place] [address] [place], [place] [address]    Report Status 10/17/2022 FINAL  Final  Urine Culture (for pregnant, neutropenic or urologic patients or patients with an indwelling urinary catheter)     Status: None   Collection Time: 10/12/22  6:43 AM   Specimen: Urine, Clean Catch  Result Value Ref Range Status   Specimen Description   Final    URINE, CLEAN CATCH Performed at [facility], [place] [address] [place], [place] [address]    Special Requests   Final    NONE Performed at [facility], [place] [address] [place], [place] [address]    Culture   Final    NO GROWTH Performed at [facility], [place] [address] [place], [place] [address]    Report Status  10/13/2022 FINAL  Final         Radiology Studies: IR IMAGING GUIDED PORT INSERTION  Result Date: 10/17/2022 INDICATION: 62 year old female with widespread metastatic breast cancer. She presents for establishment of durable venous access. EXAM: IMPLANTED PORT A CATH PLACEMENT WITH ULTRASOUND AND FLUOROSCOPIC GUIDANCE MEDICATIONS: None. ANESTHESIA/SEDATION: Versed 2 mg IV; Fentanyl 100 mcg IV; Moderate Sedation Time:  15 minutes The patient's vital signs and level of consciousness were continuously monitored during the procedure by the interventional radiology nurse under my direct supervision. FLUOROSCOPY: Radiation exposure index: 1 mGy reference air  kerma COMPLICATIONS: None immediate. PROCEDURE: The right neck and chest was prepped with chlorhexidine, and draped in the usual sterile fashion using maximum barrier technique (cap and mask, sterile gown, sterile gloves, large sterile sheet, hand hygiene and cutaneous antiseptic). Local anesthesia was attained by infiltration with 1% lidocaine with epinephrine. Ultrasound demonstrated patency of the right internal jugular vein, and this was documented with an image. Under real-time ultrasound guidance, this vein was accessed with a 21 gauge micropuncture needle and image documentation was performed. A small dermatotomy was made at the access site with an 11 scalpel. A 0.018" wire was advanced into the SVC and the access needle exchanged for a 10F micropuncture vascular sheath. The 0.018" wire was then removed and a 0.035" wire advanced into the IVC. An appropriate location for the subcutaneous reservoir was selected below the clavicle and an incision was made through the skin and underlying soft tissues. The subcutaneous tissues were then dissected using a combination of blunt and sharp surgical technique and a pocket was formed. A low-profile single lumen power injectable portacatheter was then tunneled through the subcutaneous tissues from the pocket to the  dermatotomy and the port reservoir placed within the subcutaneous pocket. The venous access site was then serially dilated and a peel away vascular sheath placed over the wire. The wire was removed and the port catheter advanced into position under fluoroscopic guidance. The catheter tip is positioned in the superior cavoatrial junction. This was documented with a spot image. The portacatheter was then tested and found to flush and aspirate well. The port was flushed with saline followed by 100 units/mL heparinized saline. The pocket was then closed in two layers using first subdermal inverted interrupted absorbable sutures followed by a running subcuticular suture. The epidermis was then sealed with Dermabond. The dermatotomy at the venous access site was also closed with Dermabond. IMPRESSION: Successful placement of a right IJ approach Power Port with ultrasound and fluoroscopic guidance. The catheter is ready for use. Electronically Signed   By: Jacqulynn Cadet M.D.   On: 10/17/2022 08:06   DG Chest 2 View  Result Date: 10/16/2022 CLINICAL DATA:  Abdominal pain EXAM: CHEST - 2 VIEW COMPARISON:  Chest radiograph dated 10/12/2022 FINDINGS: Low lung volumes. Bilateral lower lung hazy and patchy opacities with dense left retrocardiac opacities. Small bilateral pleural effusions. No pneumothorax. The heart size and mediastinal contours are within normal limits. Cervical spinal fixation hardware appears intact. Asymmetrically elevated left scapula, likely related to patient positioning. Surgical clip projects over the left upper quadrant. IMPRESSION: 1. Bilateral lower lung hazy and patchy opacities with dense left retrocardiac opacities, which could represent atelectasis, infection, or aspiration. 2. Small bilateral pleural effusions. Electronically Signed   By: Darrin Nipper M.D.   On: 10/16/2022 15:53   ECHOCARDIOGRAM COMPLETE  Result Date: 10/15/2022    ECHOCARDIOGRAM REPORT   Patient Name:   Martha [name]  [facility] Date of Exam: 10/15/2022 Medical Rec #:  [id number]             Height:       62.0 in Accession #:    [id number]            Weight:       88.8 lb Date of Birth:  06-28-1961             BSA:          1.354 m Patient Age:    50 years  BP:           119/75 mmHg Patient Gender: F                     HR:           90 bpm. Exam Location:  Inpatient Procedure: 2D Echo, 3D Echo, Cardiac Doppler, Color Doppler and Strain Analysis Indications:    chemo  History:        Patient has no prior history of Echocardiogram examinations.  Sonographer:    Phineas Douglas Referring Phys: [place]:[id number] [place]  1. Left ventricular ejection fraction, by estimation, is 55 to 60%. The left ventricle has normal function. The left ventricle has no regional wall motion abnormalities. Left ventricular diastolic parameters are consistent with Grade I diastolic dysfunction (impaired relaxation). The average left ventricular global longitudinal strain is -14.9 %. The global longitudinal strain is abnormal.  2. Right ventricular systolic function is normal. The right ventricular size is normal. There is mildly elevated pulmonary artery systolic pressure.  3. The mitral valve is normal in structure. Trivial mitral valve regurgitation. No evidence of mitral stenosis.  4. The aortic valve is tricuspid. Aortic valve regurgitation is not visualized. No aortic stenosis is present.  5. The inferior vena cava is normal in size with <50% respiratory variability, suggesting right atrial pressure of 8 mmHg. FINDINGS  Left Ventricle: Left ventricular ejection fraction, by estimation, is 55 to 60%. The left ventricle has normal function. The left ventricle has no regional wall motion abnormalities. The average left ventricular global longitudinal strain is -14.9 %. The global longitudinal strain is abnormal. The left ventricular internal cavity size was normal in size. There is no left ventricular hypertrophy. Left ventricular  diastolic parameters are consistent with Grade I diastolic dysfunction (impaired relaxation). Indeterminate filling pressures. Right Ventricle: The right ventricular size is normal. No increase in right ventricular wall thickness. Right ventricular systolic function is normal. There is mildly elevated pulmonary artery systolic pressure. The tricuspid regurgitant velocity is 2.67  m/s, and with an assumed right atrial pressure of 8 mmHg, the estimated right ventricular systolic pressure is [id number] mmHg. Left Atrium: Left atrial size was normal in size. Right Atrium: Right atrial size was normal in size. Pericardium: There is no evidence of pericardial effusion. Mitral Valve: The mitral valve is normal in structure. Trivial mitral valve regurgitation. No evidence of mitral valve stenosis. Tricuspid Valve: The tricuspid valve is normal in structure. Tricuspid valve regurgitation is trivial. No evidence of tricuspid stenosis. Aortic Valve: The aortic valve is tricuspid. Aortic valve regurgitation is not visualized. No aortic stenosis is present. Pulmonic Valve: The pulmonic valve was normal in structure. Pulmonic valve regurgitation is not visualized. No evidence of pulmonic stenosis. Aorta: The aortic root is normal in size and structure. Venous: The inferior vena cava is normal in size with less than 50% respiratory variability, suggesting right atrial pressure of 8 mmHg. IAS/Shunts: No atrial level shunt detected by color flow Doppler. Additional Comments: There is a small pleural effusion in the left lateral region.  LEFT VENTRICLE PLAX 2D LVIDd:         3.50 cm     Diastology LVIDs:         2.20 cm     LV e' medial:    6.31 cm/s LV PW:         1.10 cm     LV E/e' medial:  11.9 LV IVS:  1.00 cm     LV e' lateral:   9.68 cm/s LVOT diam:     1.80 cm     LV E/e' lateral: 7.7 LV SV:         46 LV SV Index:   34          2D Longitudinal Strain LVOT Area:     2.54 cm    2D Strain GLS (A2C):   -12.7 %                             2D Strain GLS (A3C):   -17.5 %                            2D Strain GLS (A4C):   -14.6 % LV Volumes (MOD)           2D Strain GLS Avg:     -14.9 % LV vol d, MOD A2C: 68.3 ml LV vol d, MOD A4C: 79.9 ml LV vol s, MOD A2C: 24.8 ml LV vol s, MOD A4C: 31.9 ml 3D Volume EF: LV SV MOD A2C:     43.5 ml 3D EF:        64 % LV SV MOD A4C:     79.9 ml LV EDV:       113 ml LV SV MOD BP:      45.5 ml LV ESV:       41 ml                            LV SV:        72 ml RIGHT VENTRICLE             IVC RV Basal diam:  3.20 cm     IVC diam: 1.90 cm RV S prime:     14.00 cm/s TAPSE (M-mode): 2.0 cm LEFT ATRIUM             Index        RIGHT ATRIUM           Index LA diam:        2.40 cm 1.77 cm/m   RA Area:     12.60 cm LA Vol (A2C):   34.4 ml 25.40 ml/m  RA Volume:   27.20 ml  20.09 ml/m LA Vol (A4C):   38.6 ml 28.51 ml/m LA Biplane Vol: 38.1 ml 28.14 ml/m  AORTIC VALVE LVOT Vmax:   105.00 cm/s LVOT Vmean:  63.400 cm/s LVOT VTI:    0.182 m  AORTA Ao Root diam: 2.90 cm Ao Asc diam:  2.60 cm MITRAL VALVE               TRICUSPID VALVE MV Area (PHT): 3.23 cm    TR Peak grad:   28.5 mmHg MV Decel Time: 235 msec    TR Vmax:        267.00 cm/s MV E velocity: 75.00 cm/s MV A velocity: 91.70 cm/s  SHUNTS MV E/A ratio:  0.82        Systemic VTI:  0.18 m                            Systemic Diam: 1.80 cm Skeet Latch MD Electronically signed by Skeet Latch MD Signature Date/Time: 10/15/2022/1:44:12 PM    Final  Scheduled Meds:  acetaminophen  1,000 mg Oral Q8H   acidophilus  1 capsule Oral Daily   apixaban  2.5 mg Oral BID   Chlorhexidine Gluconate Cloth  6 each Topical Daily   citalopram  40 mg Oral Daily   feeding supplement  237 mL Oral TID BM   levothyroxine  75 mcg Oral Q0600   multivitamin with minerals  1 tablet Oral Daily   pregabalin  25 mg Oral Daily   senna-docusate  1 tablet Oral BID   Continuous Infusions:   ceFAZolin (ANCEF) IV     dextrose 5 % and 0.9% NaCl 75 mL/hr at 10/16/22 1819      LOS: 5 days    Time spent:40 min    Hakeen Shipes, Geraldo Docker, MD [facility]   If 7PM-7AM, please contact night-coverage 10/17/2022, 8:47 AM

## 2022-10-17 NOTE — Consult Note (Signed)
   [facility] Inpatient Consult   10/17/2022  Martha [name] October 16, 1960 [id number]  [facility] [ACO] Patient: [name] Liaison remote coverage review for patient admitted to [facility]  Primary Care Provider:  [name], MD, listed with [facility] which is listed to provide the transition of care [TOC] follow up    Patient screened for less than 30 days readmission hospitalization with noted extreme high risk score for unplanned readmission risk and length of stay and to assess for potential [facility] service needs for post hospital transition for care coordination.    Plan:  No current needs for [facility] Care Coordination assessed at this time. Continue to follow.  Of note, [facility] Care Management/Population Health does not replace or interfere with any arrangements made by the Inpatient Transition of Care team.  For questions contact:   Natividad Brood, RN BSN [place]  [phone] business mobile phone Toll free office [phone]  *[place]  [phone] Fax number: [phone] [email or web address]

## 2022-10-17 NOTE — Progress Notes (Signed)
Occupational Therapy Treatment Patient Details Name: Elianah Burrowes MRN: [id number] DOB: 10-07-1960 Today's Date: 10/17/2022   History of present illness Martha [name] is a 62 y.o. female admitted with hallucination. 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   OT comments  Patient unfortunately, unable to demonstrate much progress today due to pain and subsequent intolerance to sitting at EOB.  Pt is motivated and wanted to keep trying  but once almost fully upright to EOB, pt required return to supine as pain was too great for pt to tolerate.  Pt participated in bed level ADLs and UE LE gentle ROM and stretching while keeping spine straight and following precautions well.   Patient remains limited by pain, generalized weakness and decreased activity tolerance along with deficits noted below. Pt continues to demonstrate good rehab potential and would benefit from continued skilled OT to increase safety and independence with ADLs and functional transfers to allow pt to return home safely and reduce caregiver burden and fall risk.    Recommendations for follow up therapy are one component of a multi-disciplinary discharge planning process, led by the attending physician.  Recommendations may be updated based on patient status, additional functional criteria and insurance authorization.    Follow Up Recommendations   (TBD depending on progress and needs)     Assistance Recommended at Discharge    Patient can return home with the following  A lot of help with bathing/dressing/bathroom;Assistance with cooking/housework;Direct supervision/assist for medications management;Assist for transportation;Two people to help with walking and/or transfers;Help with stairs or ramp for entrance;Direct supervision/assist for financial management   Equipment  Recommendations  None recommended by OT    Recommendations for Other Services      Precautions / Restrictions Precautions Precautions: Cervical;Fall Precaution Comments: verbally reviewed and pt able to recall with minimal cueing Required Braces or Orthoses: Cervical Brace Cervical Brace: Hard collar Restrictions Weight Bearing Restrictions: No       Mobility Bed Mobility   Bed Mobility: Rolling Rolling: Min assist Sidelying to sit: Mod assist       General bed mobility comments: Attempted side to sit once pt rolled to LT side, but pt in too much pain once about 3/4 of way upright pt requested back to supine and was assisted back down.    Transfers                         Balance     Sitting balance-Leahy Scale: Poor Sitting balance - Comments: Poor tolerance/unable to come fully upright today                                   ADL either performed or assessed with clinical judgement   ADL                             Toilet Transfer Details (indicate cue type and reason): Pt attempted to mobilize OOB but in too much pain despite premedication. Requested bed pan which was placed. Toileting- Clothing Manipulation and Hygiene: Maximal assistance;Bed level       Functional mobility during ADLs: Minimal assistance;Moderate assistance      Extremity/Trunk Assessment Upper Extremity Assessment Upper Extremity Assessment: RUE deficits/detail;LUE deficits/detail RUE Deficits / Details: Decreased shoulder  ROM, grossly 4-/5 strength in elbow but wrist and grip 3+/5 LUE Deficits / Details: Decreased shoulder ROM, grossly 4-/5 strength in elbow but wrist and grip 3+/5            Vision   Vision Assessment?: No apparent visual deficits   Perception     Praxis      Cognition Arousal/Alertness: Awake/alert Behavior During Therapy: WFL for tasks assessed/performed Overall Cognitive Status: Within Functional Limits for tasks  assessed                                          Exercises Other Exercises Other Exercises: Pt able to bring each knee gentle to chest as well as perform gentle piriformis stretch each side while supine in bed. Encouraged pt to continue PRN to avoid becoming stiff in bed and increasing pain. Encouraged gentle UE movement but avoidance of repeatative overhead reaching.    Shoulder Instructions       General Comments      Pertinent Vitals/ Pain       Pain Assessment Pain Assessment: 0-10 Pain Score: 8  Faces Pain Scale: Hurts whole lot Pain Location: back and right flank pain, neck pain Pain Descriptors / Indicators: Grimacing, Guarding, Sharp Pain Intervention(s): Limited activity within patient's tolerance, Monitored during session, Premedicated before session, Repositioned, Relaxation  Home Living                                          Prior Functioning/Environment              Frequency  Min 2X/week        Progress Toward Goals  OT Goals(current goals can now be found in the care plan section)  Progress towards OT goals: Not progressing toward goals - comment  Acute Rehab OT Goals Patient Stated Goal: Give another try tomorrow. OT Goal Formulation: With patient Time For Goal Achievement: 10/27/22 Potential to Achieve Goals: Good  Plan Discharge plan remains appropriate    Co-evaluation                 AM-PAC OT "6 Clicks" Daily Activity     Outcome Measure   Help from another person eating meals?: A Little Help from another person taking care of personal grooming?: A Lot Help from another person toileting, which includes using toliet, bedpan, or urinal?: A Lot Help from another person bathing (including washing, rinsing, drying)?: A Lot Help from another person to put on and taking off regular upper body clothing?: A Lot Help from another person to put on and taking off regular lower body clothing?: A Lot 6  Click Score: 13    End of Session Equipment Utilized During Treatment: Oxygen  OT Visit Diagnosis: Unsteadiness on feet (R26.81);Other abnormalities of gait and mobility (R26.89);Other symptoms and signs involving cognitive function   Activity Tolerance Patient limited by pain   Patient Left in bed;with call bell/phone within reach;with bed alarm set   Nurse Communication Other (comment) (Coordinated pain premediaction with RN)        [id number] OT Time Calculation (min): 26 min  Charges: OT General Charges $OT Visit: 1 Visit OT Treatments $Self Care/Home Management : 8-22 mins $Therapeutic Activity: 8-22 mins  Anderson Malta, [place] Office: [phone] 10/17/2022  Julien Girt 10/17/2022, 2:09 PM

## 2022-10-18 ENCOUNTER — Inpatient Hospital Stay (HOSPITAL_COMMUNITY): Payer: BC Managed Care – PPO

## 2022-10-18 ENCOUNTER — Ambulatory Visit: Payer: BC Managed Care – PPO

## 2022-10-18 ENCOUNTER — Encounter: Payer: Self-pay | Admitting: Hematology

## 2022-10-18 DIAGNOSIS — R443 Hallucinations, unspecified: Secondary | ICD-10-CM | POA: Diagnosis not present

## 2022-10-18 DIAGNOSIS — E43 Unspecified severe protein-calorie malnutrition: Secondary | ICD-10-CM | POA: Insufficient documentation

## 2022-10-18 LAB — COMPREHENSIVE METABOLIC PANEL
ALT: 57 U/L — ABNORMAL HIGH (ref 0–44)
AST: 116 U/L — ABNORMAL HIGH (ref 15–41)
Albumin: 3.1 g/dL — ABNORMAL LOW (ref 3.5–5.0)
Alkaline Phosphatase: 992 U/L — ABNORMAL HIGH (ref 38–126)
Anion gap: 7 (ref 5–15)
BUN: 6 mg/dL — ABNORMAL LOW (ref 8–23)
CO2: 24 mmol/L (ref 22–32)
Calcium: 7.9 mg/dL — ABNORMAL LOW (ref 8.9–10.3)
Chloride: 105 mmol/L (ref 98–111)
Creatinine, Ser: <0.3 mg/dL — ABNORMAL LOW (ref 0.44–1.00)
Glucose, Bld: 123 mg/dL — ABNORMAL HIGH (ref 70–99)
Potassium: 3.4 mmol/L — ABNORMAL LOW (ref 3.5–5.1)
Sodium: 136 mmol/L (ref 135–145)
Total Bilirubin: 0.8 mg/dL (ref 0.3–1.2)
Total Protein: 5.6 g/dL — ABNORMAL LOW (ref 6.5–8.1)

## 2022-10-18 LAB — CBC WITH DIFFERENTIAL/PLATELET
Abs Immature Granulocytes: 0.57 10*3/uL — ABNORMAL HIGH (ref 0.00–0.07)
Basophils Absolute: 0.1 10*3/uL (ref 0.0–0.1)
Basophils Relative: 1 %
Eosinophils Absolute: 0.1 10*3/uL (ref 0.0–0.5)
Eosinophils Relative: 1 %
HCT: 34.6 % — ABNORMAL LOW (ref 36.0–46.0)
Hemoglobin: 11.6 g/dL — ABNORMAL LOW (ref 12.0–15.0)
Immature Granulocytes: 8 %
Lymphocytes Relative: 7 %
Lymphs Abs: 0.6 10*3/uL — ABNORMAL LOW (ref 0.7–4.0)
MCH: 31.2 pg (ref 26.0–34.0)
MCHC: 33.5 g/dL (ref 30.0–36.0)
MCV: 93 fL (ref 80.0–100.0)
Monocytes Absolute: 0.7 10*3/uL (ref 0.1–1.0)
Monocytes Relative: 10 %
Neutro Abs: 5.5 10*3/uL (ref 1.7–7.7)
Neutrophils Relative %: 73 %
Platelets: 153 10*3/uL (ref 150–400)
RBC: 3.72 MIL/uL — ABNORMAL LOW (ref 3.87–5.11)
RDW: 14.7 % (ref 11.5–15.5)
WBC: 7.4 10*3/uL (ref 4.0–10.5)
nRBC: 0.3 % — ABNORMAL HIGH (ref 0.0–0.2)

## 2022-10-18 LAB — MAGNESIUM: Magnesium: 2 mg/dL (ref 1.7–2.4)

## 2022-10-18 LAB — PHOSPHORUS: Phosphorus: 3.4 mg/dL (ref 2.5–4.6)

## 2022-10-18 MED ORDER — POTASSIUM CHLORIDE CRYS ER 20 MEQ PO TBCR
40.0000 meq | EXTENDED_RELEASE_TABLET | Freq: Once | ORAL | Status: AC
  Administered 2022-10-18: 40 meq via ORAL
  Filled 2022-10-18: qty 2

## 2022-10-18 NOTE — Progress Notes (Signed)
Physical Therapy Treatment Patient Details Name: Martha [name] MRN: [id number] DOB: Aug 09, 1961 Today's Date: 10/18/2022   History of Present Illness [name] is a 62 y.o. female admitted with hallucination. Pt s/p placement of a right IJ approach single lumen PowerPort 10/16/22. 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    PT Comments    Pt agreeable to therapy, denies hallucinations today, reports constant abdominal pain that "isn't bad" in supine. Pt tolerates BLE strengthening exercises in supine without pain complaints. Pt needing increased time and cues to come to sitting EOB, moving through log rolling, mod A to upright trunk. Once sitting EOB pt yells out in pain, grabs abdomen, reports "like fire" sensation in abdomen and needing mod A to lift BLE back into bed and return to supine. RN into room for further assessment. Pt motivated, limited by high pain; will continue to progress as able.   Recommendations for follow up therapy are one component of a multi-disciplinary discharge planning process, led by the attending physician.  Recommendations may be updated based on patient status, additional functional criteria and insurance authorization.  Follow Up Recommendations  Home health PT     Assistance Recommended at Discharge Frequent or constant Supervision/Assistance  Patient can return home with the following Assistance with cooking/housework;Assist for transportation;Help with stairs or ramp for entrance;A lot of help with walking and/or transfers;A lot of help with bathing/dressing/bathroom   Equipment Recommendations  None recommended by PT    Recommendations for Other Services       Precautions / Restrictions Precautions Precautions: Cervical;Fall Required Braces or Orthoses: Cervical Brace Cervical Brace: Hard  collar Restrictions Weight Bearing Restrictions: No Other Position/Activity Restrictions: pt reports she prefers wearing the collar, thinks she is allowed to take it off to sleep, shower and get dressed but can't remember,     Mobility  Bed Mobility Overal bed mobility: Needs Assistance Bed Mobility: Rolling Rolling: Min guard Sidelying to sit: Mod assist  Sit to sidelying: Mod assist General bed mobility comments: pt comes to sidelying with increased time, min guard, pulling on bedrail lightly to rotate upper trunk; mod A to upright into sitting, pt with increased abdominal pain requesting to return to supine immediately, mod A to lift BLE back into bed and reposition to comfort    Transfers  General transfer comment: unable due to pain in static stting    Ambulation/Gait    Stairs             Wheelchair Mobility    Modified Rankin (Stroke Patients Only)       Balance Overall balance assessment: Needs assistance Sitting-balance support: Feet supported, Bilateral upper extremity supported Sitting balance-Leahy Scale: Poor Sitting balance - Comments: unable to fully upright due to abdominal pain     Cognition Arousal/Alertness: Awake/alert Behavior During Therapy: WFL for tasks assessed/performed Overall Cognitive Status: Within Functional Limits for tasks assessed  General Comments: pt denies hallucinations today        Exercises General Exercises - Lower Extremity Ankle Circles/Pumps: Supine, AROM, Both, 20 reps Quad Sets: Supine, AROM, Strengthening, Both, 10 reps Short Arc Quad: Supine, AROM, Strengthening, Both, 15 reps    General Comments        Pertinent Vitals/Pain Pain Assessment Pain Assessment: Faces Faces Pain Scale: Hurts worst Pain Location: abdomen Pain Descriptors / Indicators: Discomfort ("like fire") Pain Intervention(s): Limited activity within patient's tolerance, Monitored  during session, Premedicated before session, Repositioned     Home Living                          Prior Function            PT Goals (current goals can now be found in the care plan section) Acute Rehab PT Goals Patient Stated Goal: less pain, regain strength PT Goal Formulation: With patient/family Time For Goal Achievement: 10/26/22 Potential to Achieve Goals: Fair Progress towards PT goals: Progressing toward goals (very slowly, limited by pain)    Frequency    Min 3X/week      PT Plan Current plan remains appropriate    Co-evaluation              AM-PAC PT "6 Clicks" Mobility   Outcome Measure  Help needed turning from your back to your side while in a flat bed without using bedrails?: A Little Help needed moving from lying on your back to sitting on the side of a flat bed without using bedrails?: A Lot Help needed moving to and from a bed to a chair (including a wheelchair)?: A Lot Help needed standing up from a chair using your arms (e.g., wheelchair or bedside chair)?: A Lot Help needed to walk in hospital room?: Total Help needed climbing 3-5 steps with a railing? : Total 6 Click Score: 11    End of Session   Activity Tolerance: Patient limited by pain Patient left: in bed;with call bell/phone within reach;with bed alarm set;with nursing/sitter in room Nurse Communication: Mobility status PT Visit Diagnosis: Other abnormalities of gait and mobility (R26.89);Muscle weakness (generalized) (M62.81);Difficulty in walking, not elsewhere classified (R26.2);Other symptoms and signs involving the nervous system [id number])     Time: [id number] PT Time Calculation (min) (ACUTE ONLY): 27 min  Charges:  $Therapeutic Exercise: 8-22 mins $Therapeutic Activity: 8-22 mins                      Tori Abbigal Radich PT, DPT 10/18/22, 10:04 AM

## 2022-10-18 NOTE — Progress Notes (Signed)
Daily Progress Note   Patient Name: Martha [name]       Date: 10/18/2022 DOB: 1960-09-17  Age: 62 y.o. MRN#: [id number] Attending Physician: Dessa Phi, DO Primary Care Physician: [name], MD Admit Date: 10/12/2022 Length of Stay: 6 days  Reason for Consultation/Follow-up: Establishing goals of care and Symptom Management  Subjective:   CC: Patient notes abdominal pain improved with medication. She also had a bowel movement, tolerating PO Following up regarding complex medical decision making and symptom management.   Subjective:  At time of EMR review, patient is on scheduled Tylenol given orally, also on PRN dose of 5 mg of oxycodone.   Awake alert, resting in bed.  Discussed about symptom management.  Patient has not required Ativan lately.  States that she is resting ok, neck collar seems ot bother her but she is tolerating it well.    All questions answered at that time. Thanked patient for allowing this provider to visit with her today.   Review of Systems  Objective:   Vital Signs:  BP (!) 146/69 (BP Location: Left Arm)   Pulse 96   Temp 98.3 F (36.8 C) (Oral)   Resp 17   Ht 5' 2"$  (1.575 m)   Wt 40.3 kg   SpO2 95%   BMI 16.25 kg/m   Physical Exam: General: NAD, alert, laying in bed, pleasant  Eyes: no drainage noted HENT: dry mucous membranes Cardiovascular: RRR, no edema in LE b/l Respiratory: no increased work of breathing noted, not in respiratory distress Abdomen: not distended Extremities: moving all appropriately  Skin: no rashes or lesions on visible skin Neuro: A&Ox4, following commands easily Psych: appropriately answers all questions  Imaging:  I personally reviewed recent imaging.   Assessment & Plan:   Assessment: Palliative Care consult requested for goals of care discussion in this 62 y.o. female  with past medical history of ER+ breast cancer (01/2018), right breast cancer (04/2021), now with metastatic disease  progression involving brain and liver, pathological fractures with osseous mets s/p brain radiation. She was admitted on 10/12/2022 from home with confusion and weakness. Recently discharged from [facility] on 1/21 after cervical spine surgery.    Recommendations/Plan: # Complex medical decision making/goals of care:  - DNR/DNI status was confirmed by patient and husband on 10/15/22.  - Continue with current plan of care. Patient and husband clear in expressed wishes to continue to treat the treatable aggressively. They are realistic in their understanding of current disease progression and poor long-term prognosis. Hopeful for the ability to continue to thrive and start chemotherapy.  - Patient previously stated that if she is unable to make medical decisions for herself, she would want her husband to make medical decision for her.   -  Code Status: DNR  # Symptom management:  -Pain, acute on chronic in setting on metastatic breat cancer continue Tylenol 165m q8hrs scheduled during the day -continue oxycodone 2.5-535mq4hrs prn   Lyrica 2594maily in AM  took first dose in am on 2/20   -Anxiety/Depression   -  po Ativan 0.5mg38mhrs prn   -Continue Celexa 40mg69mly   -Constipation   -Miralax as needed   -Continue Senna 1 tab twice daily   -Nausea   -Zofran as needed  # Discharge Planning: TBD  [facility] will need [facility] up at time of discharge.   Discussed with: IDT  Thank you for allowing the palliative care team  to participate in the care [name]. Mod MDM.  Loistine Chance MD Palliative Care Provider PMT # [phone]  If patient remains symptomatic despite maximum doses, please call PMT at [phone] between 0700 and 1900. Outside of these hours, please call attending, as PMT does not have night coverage.

## 2022-10-18 NOTE — Progress Notes (Signed)
PROGRESS NOTE    Martha [name]  [id number] DOB: [birth year] DOA: 10/12/2022 PCP: [name], MD     Brief Narrative:  Martha [name] is a 62 y.o. female with PMHx anxiety, depression, dysrhythmia, ER positive breast cancer diagnosed 12/2017 with metastasis to cervical spine and brain discovered in July 2023.    She was admitted to the hospital for evaluation and management of confusion and weakness.  The patient was recently discharged from [facility] health inpatient rehab on 1/31.  She is currently undergoing radiation to her brain.  She has some chronic dizziness, and sensation of falling out of the bed. Husband states that this has been going on for several weeks especially after her more recent cervical spine surgery in January 2024.  This problem seems to come and go, has been worse in the last 2 to 3 days.  Patient's husband also states that she has been somewhat confused and having some hallucinations, for example seeing people in her room and even this morning here in the hospital who are not present.  She has never really had this problem before, started in the last 3 to 4 days.  Per the patient and her husband, it seems that this confusion is somewhat transient, does not last very long.  While she was in rehab, her Celexa dose was increased to 40 mg, her gabapentin was titrated up to 200 mg p.o. 3 times daily.  There have been no other significant medication changes, and there has not been any medication changes since discharge from rehab.  Overall patient and her husband feel that she has been doing well considering what she has been through, she continues to ambulate with a walker, she is supposed to start outpatient therapy soon.   ED Course: Husband called EMS and the patient was brought to the emergency department for evaluation, where she has normal vital signs and is stable on room air.  So far lab work reveals normal white blood cell count, normal hemoglobin, platelets  128.  Sodium 132, potassium 3.4.  Renal function is normal.  Calcium 12.7.  Lactate is normal at 1.4.  New events last 24 hours / Subjective: Patient complaining of low appetite.  Complains of sore throat as well as some abdominal pain.  Assessment & Plan:   Principal Problem:   Hallucination Active Problems:   Malignant neoplasm of lower-inner quadrant of left breast in female, estrogen receptor positive ([place])   Cervicalgia   Cancer, metastatic to bone [facility])   Medication management   Change in mental status   AMS (altered mental status)   Depression   Cancer associated pain   DNR (do not resuscitate)   High risk medication use   Goals of care, counseling/discussion   Palliative care encounter   Metastatic breast cancer with mets to brain and C-spine -Followed by Dr. Burr Medico -Status post cervical spine surgery, brain radiation, chemotherapy -Dr. Burr Medico rescheduled chemotherapy [id number]  Acute metabolic encephalopathy -Multifactorial in setting of brain mets, pain medication, ?  Infection -Patient has had waxing and waning hallucinations -Seems to be resolved, alert and oriented today  Hypokalemia -Replace  Anxiety and depression -Celexa, Ativan as needed  Hypothyroidism -Synthroid    DVT prophylaxis:  apixaban (ELIQUIS) tablet 2.5 mg Start: 10/12/22 1100 SCDs Start: 10/12/22 0743 apixaban (ELIQUIS) tablet 2.5 mg  - started on eliquis at inpatient repab in Jan 2024 for DVT ppx  Code Status: DNR Family Communication: None at bedside  Disposition Plan:  Status is:  Inpatient Remains inpatient appropriate because: Need PT OT     Antimicrobials:  Anti-infectives (From admission, onward)    Start     Dose/Rate Route Frequency Ordered Stop   10/16/22 1140  ceFAZolin (ANCEF) IVPB 2g/100 mL premix        2 g 200 mL/hr over 30 Minutes Intravenous 30 min pre-op 10/16/22 1140          Objective: Vitals:   10/17/22 0447 10/17/22 1402 10/17/22 2110 10/18/22 0601  BP:  (!) 143/77 (!) 152/81 (!) 160/84 (!) 146/81  Pulse: 96 97 (!) 108 96  Resp: 16 18 18 16  $ Temp: 98.2 F (36.8 C) 98.4 F (36.9 C) 98.1 F (36.7 C) 97.9 F (36.6 C)  TempSrc: Oral Oral Oral Oral  SpO2: 91% 90% 92% 92%  Weight:      Height:        Intake/Output Summary (Last 24 hours) at 10/18/2022 1323 Last data filed at 10/18/2022 0900 Gross per 24 hour  Intake 1840 ml  Output 1550 ml  Net 290 ml   Filed Weights   10/12/22 0603  Weight: 40.3 kg    Examination:  General exam: Appears calm and comfortable, c-collar  Respiratory system: Clear to auscultation. Respiratory effort normal. No respiratory distress. No conversational dyspnea.  Cardiovascular system: S1 & S2 heard, RRR. No murmurs. No pedal edema. Gastrointestinal system: Abdomen is nondistended, soft and nontender. Normal bowel sounds heard. Central nervous system: Alert and oriented. No focal neurological deficits. Speech clear.  Extremities: Symmetric in appearance  Skin: No rashes, lesions or ulcers on exposed skin  Psychiatry: Judgement and insight appear normal. Mood & affect appropriate.   Data Reviewed: I have personally reviewed following labs and imaging studies  CBC: Recent Labs  Lab 10/14/22 0606 10/15/22 [id number] 10/16/22 0512 10/16/22 1211 10/17/22 0648 10/18/22 0607  WBC 5.7 6.9 6.5 6.9 7.4 7.4  NEUTROABS 4.9 6.1 5.2  --  5.7 5.5  HGB 10.7* 11.0* 11.3* 11.5* 11.4* 11.6*  HCT 31.5* 33.3* 33.7* 35.2* 33.7* 34.6*  MCV 94.0 95.4 93.6 96.4 93.4 93.0  PLT 117* 129* 131* 125* 146* 0000000   Basic Metabolic Panel: Recent Labs  Lab 10/15/22 0632 10/16/22 0512 10/16/22 1211 10/17/22 0648 10/17/22 1800 10/18/22 0607  NA 135 131* 136 136  --  136  K 3.3* 4.3 3.8 3.0* 4.1 3.4*  CL 106 104 105 102  --  105  CO2 20* 21* 22 23  --  24  GLUCOSE 127* 126* 120* 126*  --  123*  BUN 11 5* <5* <5*  --  6*  CREATININE 0.34* <0.30* 0.39* <0.30*  --  <0.30*  CALCIUM 8.2* 7.2* 7.6* 8.1*  --  7.9*  MG 1.9 2.5*   --  2.2 1.9 2.0  PHOS 1.7* 3.2  --  2.4* 1.6* 3.4   GFR: CrCl cannot be calculated (This lab value cannot be used to calculate CrCl because it is not a number: <0.30). Liver Function Tests: Recent Labs  Lab 10/15/22 [id number] 10/16/22 0512 10/16/22 1211 10/17/22 0648 10/18/22 0607  AST 93* 98* 102* 96* 116*  ALT 31 33 34 36 57*  ALKPHOS 859* 940* 977* 970* 992*  BILITOT 0.7 0.9 0.7 1.0 0.8  PROT 5.8* 6.0* 5.6* 5.7* 5.6*  ALBUMIN 3.4* 3.4* 3.3* 3.1* 3.1*   No results for input(s): "LIPASE", "AMYLASE" in the last 168 hours. Recent Labs  Lab 10/12/22 0652  AMMONIA 23   Coagulation Profile: Recent Labs  Lab 10/12/22 [phone] 10/16/22  1211  INR 1.3* 1.4*   Cardiac Enzymes: No results for input(s): "CKTOTAL", "CKMB", "CKMBINDEX", "TROPONINI" in the last 168 hours. BNP (last 3 results) No results for input(s): "PROBNP" in the last 8760 hours. HbA1C: No results for input(s): "HGBA1C" in the last 72 hours. CBG: No results for input(s): "GLUCAP" in the last 168 hours. Lipid Profile: No results for input(s): "CHOL", "HDL", "LDLCALC", "TRIG", "CHOLHDL", "LDLDIRECT" in the last 72 hours. Thyroid Function Tests: No results for input(s): "TSH", "T4TOTAL", "FREET4", "T3FREE", "THYROIDAB" in the last 72 hours. Anemia Panel: No results for input(s): "VITAMINB12", "FOLATE", "FERRITIN", "TIBC", "IRON", "RETICCTPCT" in the last 72 hours. Sepsis Labs: Recent Labs  Lab 10/12/22 0445 10/12/22 0652  PROCALCITON  --  0.10  LATICACIDVEN 1.4  --     Recent Results (from the past 240 hour(s))  Resp panel by RT-PCR (RSV, Flu A&B, Covid) Anterior Nasal Swab     Status: None   Collection Time: 10/12/22  4:35 AM   Specimen: Anterior Nasal Swab  Result Value Ref Range Status   SARS Coronavirus 2 by RT PCR NEGATIVE NEGATIVE Final    Comment: (NOTE) SARS-CoV-2 target nucleic acids are NOT DETECTED.  The SARS-CoV-2 RNA is generally detectable in upper respiratory specimens during the acute phase  of infection. The lowest concentration of SARS-CoV-2 viral copies this assay can detect is 138 copies/mL. A negative result does not preclude SARS-Cov-2 infection and should not be used as the sole basis for treatment or other patient management decisions. A negative result may occur with  improper specimen collection/handling, submission of specimen other than nasopharyngeal swab, presence of viral mutation(s) within the areas targeted by this assay, and inadequate number of viral copies(<138 copies/mL). A negative result must be combined with clinical observations, patient history, and epidemiological information. The expected result is Negative.  Fact Sheet for Patients:  EntrepreneurPulse.com.au  Fact Sheet for Healthcare Providers:  IncredibleEmployment.be  This test is no t yet approved or cleared by the [place] FDA and  has been authorized for detection and/or diagnosis of SARS-CoV-2 by FDA under an Emergency Use Authorization (EUA). This EUA will remain  in effect (meaning this test can be used) for the duration of the COVID-19 declaration under Section 564(b)(1) of the Act, 21 U.S.C.section 360bbb-3(b)(1), unless the authorization is terminated  or revoked sooner.       Influenza A by PCR NEGATIVE NEGATIVE Final   Influenza B by PCR NEGATIVE NEGATIVE Final    Comment: (NOTE) The Xpert Xpress SARS-CoV-2/FLU/RSV plus assay is intended as an aid in the diagnosis of influenza from Nasopharyngeal swab specimens and should not be used as a sole basis for treatment. Nasal washings and aspirates are unacceptable for Xpert Xpress SARS-CoV-2/FLU/RSV testing.  Fact Sheet for Patients: EntrepreneurPulse.com.au  Fact Sheet for Healthcare Providers: IncredibleEmployment.be  This test is not yet approved or cleared by the [place] FDA and has been authorized for detection and/or diagnosis of  SARS-CoV-2 by FDA under an Emergency Use Authorization (EUA). This EUA will remain in effect (meaning this test can be used) for the duration of the COVID-19 declaration under Section 564(b)(1) of the Act, 21 U.S.C. section 360bbb-3(b)(1), unless the authorization is terminated or revoked.     Resp Syncytial Virus by PCR NEGATIVE NEGATIVE Final    Comment: (NOTE) Fact Sheet for Patients: EntrepreneurPulse.com.au  Fact Sheet for Healthcare Providers: IncredibleEmployment.be  This test is not yet approved or cleared by the [place] FDA and has been authorized for detection and/or  diagnosis of SARS-CoV-2 by FDA under an Emergency Use Authorization (EUA). This EUA will remain in effect (meaning this test can be used) for the duration of the COVID-19 declaration under Section 564(b)(1) of the Act, 21 U.S.C. section 360bbb-3(b)(1), unless the authorization is terminated or revoked.  Performed at [facility], [place] [address], [place], [place] [address]   Blood Culture (routine x 2)     Status: None   Collection Time: 10/12/22  4:35 AM   Specimen: BLOOD  Result Value Ref Range Status   Specimen Description   Final    BLOOD LEFT ANTECUBITAL Performed at [place] [address], [place], [place] [address]    Special Requests   Final    BOTTLES DRAWN AEROBIC AND ANAEROBIC Blood Culture adequate volume Performed at [place] [address], [place], [place] [address]    Culture   Final    NO GROWTH 5 DAYS Performed at [facility], [place] [address] [place], [place] [address]    Report Status 10/17/2022 FINAL  Final  Blood Culture (routine x 2)     Status: None   Collection Time: 10/12/22  4:45 AM   Specimen: BLOOD  Result Value Ref Range Status   Specimen Description   Final    BLOOD RIGHT ANTECUBITAL Performed at [place] [address]  [place], [place] [address]    Special Requests   Final    BOTTLES DRAWN AEROBIC AND ANAEROBIC Blood Culture results may not be optimal due to an excessive volume of blood received in culture bottles Performed at [place] [address] [place], [place] [address]    Culture   Final    NO GROWTH 5 DAYS Performed at [facility], [place] [address], [place], [place] [address]    Report Status 10/17/2022 FINAL  Final  Urine Culture (for pregnant, neutropenic or urologic patients or patients with an indwelling urinary catheter)     Status: None   Collection Time: 10/12/22  6:43 AM   Specimen: Urine, Clean Catch  Result Value Ref Range Status   Specimen Description   Final    URINE, CLEAN CATCH Performed at [facility], [place] [address], [place], [place] [address]    Special Requests   Final    NONE Performed at [facility], [place] [address] [place], [place] [address]    Culture   Final    NO GROWTH Performed at [facility], [place] [address], [place], [place] [address]    Report Status 10/13/2022 FINAL  Final      Radiology Studies: IR IMAGING GUIDED PORT INSERTION  Result Date: 10/17/2022 INDICATION: 61 year old female with widespread metastatic breast cancer. She presents for establishment of durable venous access. EXAM: IMPLANTED PORT A CATH PLACEMENT WITH ULTRASOUND AND FLUOROSCOPIC GUIDANCE MEDICATIONS: None. ANESTHESIA/SEDATION: Versed 2 mg IV; Fentanyl 100 mcg IV; Moderate Sedation Time:  15 minutes The patient's vital signs and level of consciousness were continuously monitored during the procedure by the interventional radiology nurse under my direct supervision. FLUOROSCOPY: Radiation exposure index: 1 mGy reference air kerma COMPLICATIONS: None immediate. PROCEDURE: The right neck and chest was prepped with chlorhexidine, and draped in the usual sterile fashion using maximum barrier technique (cap and mask, sterile  gown, sterile gloves, large sterile sheet, hand hygiene and cutaneous antiseptic). Local anesthesia was attained by infiltration with 1% lidocaine with epinephrine. Ultrasound demonstrated patency  of the right internal jugular vein, and this was documented with an image. Under real-time ultrasound guidance, this vein was accessed with a 21 gauge micropuncture needle and image documentation was performed. A small dermatotomy was made at the access site with an 11 scalpel. A 0.018" wire was advanced into the SVC and the access needle exchanged for a 21F micropuncture vascular sheath. The 0.018" wire was then removed and a 0.035" wire advanced into the IVC. An appropriate location for the subcutaneous reservoir was selected below the clavicle and an incision was made through the skin and underlying soft tissues. The subcutaneous tissues were then dissected using a combination of blunt and sharp surgical technique and a pocket was formed. A low-profile single lumen power injectable portacatheter was then tunneled through the subcutaneous tissues from the pocket to the dermatotomy and the port reservoir placed within the subcutaneous pocket. The venous access site was then serially dilated and a peel away vascular sheath placed over the wire. The wire was removed and the port catheter advanced into position under fluoroscopic guidance. The catheter tip is positioned in the superior cavoatrial junction. This was documented with a spot image. The portacatheter was then tested and found to flush and aspirate well. The port was flushed with saline followed by 100 units/mL heparinized saline. The pocket was then closed in two layers using first subdermal inverted interrupted absorbable sutures followed by a running subcuticular suture. The epidermis was then sealed with Dermabond. The dermatotomy at the venous access site was also closed with Dermabond. IMPRESSION: Successful placement of a right IJ approach Power Port with  ultrasound and fluoroscopic guidance. The catheter is ready for use. Electronically Signed   By: Jacqulynn Cadet M.D.   On: 10/17/2022 08:06   DG Chest 2 View  Result Date: 10/16/2022 CLINICAL DATA:  Abdominal pain EXAM: CHEST - 2 VIEW COMPARISON:  Chest radiograph dated 10/12/2022 FINDINGS: Low lung volumes. Bilateral lower lung hazy and patchy opacities with dense left retrocardiac opacities. Small bilateral pleural effusions. No pneumothorax. The heart size and mediastinal contours are within normal limits. Cervical spinal fixation hardware appears intact. Asymmetrically elevated left scapula, likely related to patient positioning. Surgical clip projects over the left upper quadrant. IMPRESSION: 1. Bilateral lower lung hazy and patchy opacities with dense left retrocardiac opacities, which could represent atelectasis, infection, or aspiration. 2. Small bilateral pleural effusions. Electronically Signed   By: Darrin Nipper M.D.   On: 10/16/2022 15:53      Scheduled Meds:  acetaminophen  1,000 mg Oral Q8H   acidophilus  1 capsule Oral Daily   apixaban  2.5 mg Oral BID   Chlorhexidine Gluconate Cloth  6 each Topical Daily   citalopram  40 mg Oral Daily   feeding supplement  237 mL Oral TID BM   levothyroxine  75 mcg Oral Q0600   multivitamin with minerals  1 tablet Oral Daily   pregabalin  25 mg Oral Daily   senna-docusate  1 tablet Oral BID   Continuous Infusions:   ceFAZolin (ANCEF) IV     dextrose 5 % and 0.9% NaCl 75 mL/hr at 10/18/22 0626     LOS: 6 days   Time spent: 25 minutes   Dessa Phi, DO [facility] 10/18/2022, 1:23 PM   Available via Epic secure chat 7am-7pm After these hours, please refer to coverage provider listed on [email or web address]

## 2022-10-19 ENCOUNTER — Inpatient Hospital Stay: Payer: BC Managed Care – PPO

## 2022-10-19 ENCOUNTER — Other Ambulatory Visit: Payer: Self-pay | Admitting: Physical Medicine and Rehabilitation

## 2022-10-19 ENCOUNTER — Inpatient Hospital Stay: Payer: BC Managed Care – PPO | Admitting: Hematology

## 2022-10-19 ENCOUNTER — Ambulatory Visit: Payer: BC Managed Care – PPO

## 2022-10-19 ENCOUNTER — Encounter (HOSPITAL_BASED_OUTPATIENT_CLINIC_OR_DEPARTMENT_OTHER): Payer: Self-pay | Admitting: Physical Therapy

## 2022-10-19 DIAGNOSIS — R443 Hallucinations, unspecified: Secondary | ICD-10-CM | POA: Diagnosis not present

## 2022-10-19 LAB — COMPREHENSIVE METABOLIC PANEL
ALT: 46 U/L — ABNORMAL HIGH (ref 0–44)
AST: 95 U/L — ABNORMAL HIGH (ref 15–41)
Albumin: 3.2 g/dL — ABNORMAL LOW (ref 3.5–5.0)
Alkaline Phosphatase: 1006 U/L — ABNORMAL HIGH (ref 38–126)
Anion gap: 8 (ref 5–15)
BUN: 6 mg/dL — ABNORMAL LOW (ref 8–23)
CO2: 23 mmol/L (ref 22–32)
Calcium: 8.4 mg/dL — ABNORMAL LOW (ref 8.9–10.3)
Chloride: 101 mmol/L (ref 98–111)
Creatinine, Ser: <0.3 mg/dL — ABNORMAL LOW (ref 0.44–1.00)
Glucose, Bld: 126 mg/dL — ABNORMAL HIGH (ref 70–99)
Potassium: 3.2 mmol/L — ABNORMAL LOW (ref 3.5–5.1)
Sodium: 132 mmol/L — ABNORMAL LOW (ref 135–145)
Total Bilirubin: 0.9 mg/dL (ref 0.3–1.2)
Total Protein: 5.7 g/dL — ABNORMAL LOW (ref 6.5–8.1)

## 2022-10-19 LAB — CBC WITH DIFFERENTIAL/PLATELET
Abs Immature Granulocytes: 0.89 10*3/uL — ABNORMAL HIGH (ref 0.00–0.07)
Basophils Absolute: 0.1 10*3/uL (ref 0.0–0.1)
Basophils Relative: 1 %
Eosinophils Absolute: 0.1 10*3/uL (ref 0.0–0.5)
Eosinophils Relative: 1 %
HCT: 34.7 % — ABNORMAL LOW (ref 36.0–46.0)
Hemoglobin: 11.8 g/dL — ABNORMAL LOW (ref 12.0–15.0)
Immature Granulocytes: 9 %
Lymphocytes Relative: 5 %
Lymphs Abs: 0.5 10*3/uL — ABNORMAL LOW (ref 0.7–4.0)
MCH: 31.1 pg (ref 26.0–34.0)
MCHC: 34 g/dL (ref 30.0–36.0)
MCV: 91.3 fL (ref 80.0–100.0)
Monocytes Absolute: 1 10*3/uL (ref 0.1–1.0)
Monocytes Relative: 10 %
Neutro Abs: 7.7 10*3/uL (ref 1.7–7.7)
Neutrophils Relative %: 74 %
Platelets: 175 10*3/uL (ref 150–400)
RBC: 3.8 MIL/uL — ABNORMAL LOW (ref 3.87–5.11)
RDW: 14.8 % (ref 11.5–15.5)
WBC: 10.3 10*3/uL (ref 4.0–10.5)
nRBC: 0 % (ref 0.0–0.2)

## 2022-10-19 LAB — MAGNESIUM: Magnesium: 1.8 mg/dL (ref 1.7–2.4)

## 2022-10-19 LAB — PHOSPHORUS: Phosphorus: 2.6 mg/dL (ref 2.5–4.6)

## 2022-10-19 MED ORDER — POTASSIUM CHLORIDE CRYS ER 20 MEQ PO TBCR
40.0000 meq | EXTENDED_RELEASE_TABLET | Freq: Two times a day (BID) | ORAL | Status: AC
  Administered 2022-10-19 (×2): 40 meq via ORAL
  Filled 2022-10-19 (×2): qty 2

## 2022-10-19 NOTE — Progress Notes (Signed)
PROGRESS NOTE    Martha [name]  [id number] DOB: [birth year] DOA: 10/12/2022 PCP: [name], MD     Brief Narrative:  Martha [name] is a 62 y.o. female with PMHx anxiety, depression, dysrhythmia, ER positive breast cancer diagnosed 12/2017 with metastasis to cervical spine and brain discovered in July 2023.    She was admitted to the hospital for evaluation and management of confusion and weakness.  The patient was recently discharged from [facility] health inpatient rehab on 1/31.  She is currently undergoing radiation to her brain.  She has some chronic dizziness, and sensation of falling out of the bed. Husband states that this has been going on for several weeks especially after her more recent cervical spine surgery in January 2024.  This problem seems to come and go, has been worse in the last 2 to 3 days.  Patient's husband also states that she has been somewhat confused and having some hallucinations, for example seeing people in her room and even this morning here in the hospital who are not present.  She has never really had this problem before, started in the last 3 to 4 days.  Per the patient and her husband, it seems that this confusion is somewhat transient, does not last very long.  While she was in rehab, her Celexa dose was increased to 40 mg, her gabapentin was titrated up to 200 mg p.o. 3 times daily.  There have been no other significant medication changes, and there has not been any medication changes since discharge from rehab.  Overall patient and her husband feel that she has been doing well considering what she has been through, she continues to ambulate with a walker, she is supposed to start outpatient therapy soon.   ED Course: Husband called EMS and the patient was brought to the emergency department for evaluation, where she has normal vital signs and is stable on room air.  So far lab work reveals normal white blood cell count, normal hemoglobin, platelets  128.  Sodium 132, potassium 3.4.  Renal function is normal.  Calcium 12.7.  Lactate is normal at 1.4.  New events last 24 hours / Subjective: Feeling a bit better today. Taking her potassium pill this morning. Thinks she will have a BM today.   Assessment & Plan:   Principal Problem:   Hallucination Active Problems:   Malignant neoplasm of lower-inner quadrant of left breast in female, estrogen receptor positive ([place])   Cervicalgia   Cancer, metastatic to bone ([name])   Medication management   Change in mental status   AMS (altered mental status)   Depression   Cancer associated pain   DNR (do not resuscitate)   High risk medication use   Goals of care, counseling/discussion   Palliative care encounter   Protein-calorie malnutrition, severe ([place])   Metastatic breast cancer with mets to brain and C-spine -Followed by Dr. Burr Medico -Status post cervical spine surgery, brain radiation, chemotherapy -Dr. Burr Medico rescheduled chemotherapy [id number]  Acute metabolic encephalopathy -Multifactorial in setting of brain mets, pain medication, ?  Infection -Patient has had waxing and waning hallucinations -Resolved   Hypokalemia -Replace  Anxiety and depression -Celexa, Ativan as needed  Hypothyroidism -Synthroid    DVT prophylaxis:  apixaban (ELIQUIS) tablet 2.5 mg Start: 10/12/22 1100 SCDs Start: 10/12/22 0743 apixaban (ELIQUIS) tablet 2.5 mg  - started on eliquis at inpatient repab in Jan 2024 for DVT ppx  Code Status: DNR Family Communication: None at bedside  Disposition Plan:  Status is: Inpatient Remains inpatient appropriate because: Plan for discharge home with home health tomorrow     Antimicrobials:  Anti-infectives (From admission, onward)    Start     Dose/Rate Route Frequency Ordered Stop   10/16/22 1140  ceFAZolin (ANCEF) IVPB 2g/100 mL premix        2 g 200 mL/hr over 30 Minutes Intravenous 30 min pre-op 10/16/22 1140          Objective: Vitals:   10/18/22  0601 10/18/22 1436 10/18/22 2051 10/19/22 0547  BP: (!) 146/81 (!) 146/69 (!) 145/80 (!) 150/81  Pulse: 96 96 88 (!) 101  Resp: 16 17 16 16  $ Temp: 97.9 F (36.6 C) 98.3 F (36.8 C) 98.6 F (37 C) 98.5 F (36.9 C)  TempSrc: Oral Oral Oral Oral  SpO2: 92% 95% 94% 96%  Weight:      Height:        Intake/Output Summary (Last 24 hours) at 10/19/2022 1209 Last data filed at 10/19/2022 0900 Gross per 24 hour  Intake 2286.76 ml  Output 1500 ml  Net 786.76 ml    Filed Weights   10/12/22 0603  Weight: 40.3 kg    Examination:  General exam: Appears calm and comfortable, c-collar  Respiratory system: Clear to auscultation. Respiratory effort normal. No respiratory distress. No conversational dyspnea.  Cardiovascular system: S1 & S2 heard, RRR. No murmurs. No pedal edema. Gastrointestinal system: Abdomen is nondistended, soft and nontender. Normal bowel sounds heard. Central nervous system: Alert and oriented. No focal neurological deficits. Speech clear.  Extremities: Symmetric in appearance  Skin: No rashes, lesions or ulcers on exposed skin  Psychiatry: Judgement and insight appear normal. Mood & affect appropriate.   Data Reviewed: I have personally reviewed following labs and imaging studies  CBC: Recent Labs  Lab 10/15/22 0632 10/16/22 0512 10/16/22 1211 10/17/22 0648 10/18/22 0607 10/19/22 0500  WBC 6.9 6.5 6.9 7.4 7.4 10.3  NEUTROABS 6.1 5.2  --  5.7 5.5 7.7  HGB 11.0* 11.3* 11.5* 11.4* 11.6* 11.8*  HCT 33.3* 33.7* 35.2* 33.7* 34.6* 34.7*  MCV 95.4 93.6 96.4 93.4 93.0 91.3  PLT 129* 131* 125* 146* 153 0000000    Basic Metabolic Panel: Recent Labs  Lab 10/16/22 0512 10/16/22 1211 10/17/22 0648 10/17/22 1800 10/18/22 0607 10/19/22 0500  NA 131* 136 136  --  136 132*  K 4.3 3.8 3.0* 4.1 3.4* 3.2*  CL 104 105 102  --  105 101  CO2 21* 22 23  --  24 23  GLUCOSE 126* 120* 126*  --  123* 126*  BUN 5* <5* <5*  --  6* 6*  CREATININE <0.30* 0.39* <0.30*  --  <0.30*  <0.30*  CALCIUM 7.2* 7.6* 8.1*  --  7.9* 8.4*  MG 2.5*  --  2.2 1.9 2.0 1.8  PHOS 3.2  --  2.4* 1.6* 3.4 2.6    GFR: CrCl cannot be calculated (This lab value cannot be used to calculate CrCl because it is not a number: <0.30). Liver Function Tests: Recent Labs  Lab 10/16/22 0512 10/16/22 1211 10/17/22 0648 10/18/22 0607 10/19/22 0500  AST 98* 102* 96* 116* 95*  ALT 33 34 36 57* 46*  ALKPHOS 940* 977* 970* 992* 1,006*  BILITOT 0.9 0.7 1.0 0.8 0.9  PROT 6.0* 5.6* 5.7* 5.6* 5.7*  ALBUMIN 3.4* 3.3* 3.1* 3.1* 3.2*    No results for input(s): "LIPASE", "AMYLASE" in the last 168 hours. No results for input(s): "AMMONIA" in the last 168  hours.  Coagulation Profile: Recent Labs  Lab 10/16/22 1211  INR 1.4*    Cardiac Enzymes: No results for input(s): "CKTOTAL", "CKMB", "CKMBINDEX", "TROPONINI" in the last 168 hours. BNP (last 3 results) No results for input(s): "PROBNP" in the last 8760 hours. HbA1C: No results for input(s): "HGBA1C" in the last 72 hours. CBG: No results for input(s): "GLUCAP" in the last 168 hours. Lipid Profile: No results for input(s): "CHOL", "HDL", "LDLCALC", "TRIG", "CHOLHDL", "LDLDIRECT" in the last 72 hours. Thyroid Function Tests: No results for input(s): "TSH", "T4TOTAL", "FREET4", "T3FREE", "THYROIDAB" in the last 72 hours. Anemia Panel: No results for input(s): "VITAMINB12", "FOLATE", "FERRITIN", "TIBC", "IRON", "RETICCTPCT" in the last 72 hours. Sepsis Labs: No results for input(s): "PROCALCITON", "LATICACIDVEN" in the last 168 hours.   Recent Results (from the past 240 hour(s))  Resp panel by RT-PCR (RSV, Flu A&B, Covid) Anterior Nasal Swab     Status: None   Collection Time: 10/12/22  4:35 AM   Specimen: Anterior Nasal Swab  Result Value Ref Range Status   SARS Coronavirus 2 by RT PCR NEGATIVE NEGATIVE Final    Comment: (NOTE) SARS-CoV-2 target nucleic acids are NOT DETECTED.  The SARS-CoV-2 RNA is generally detectable in upper  respiratory specimens during the acute phase of infection. The lowest concentration of SARS-CoV-2 viral copies this assay can detect is 138 copies/mL. A negative result does not preclude SARS-Cov-2 infection and should not be used as the sole basis for treatment or other patient management decisions. A negative result may occur with  improper specimen collection/handling, submission of specimen other than nasopharyngeal swab, presence of viral mutation(s) within the areas targeted by this assay, and inadequate number of viral copies(<138 copies/mL). A negative result must be combined with clinical observations, patient history, and epidemiological information. The expected result is Negative.  Fact Sheet for Patients:  EntrepreneurPulse.com.au  Fact Sheet for Healthcare Providers:  IncredibleEmployment.be  This test is no t yet approved or cleared by the [place] FDA and  has been authorized for detection and/or diagnosis of SARS-CoV-2 by FDA under an Emergency Use Authorization (EUA). This EUA will remain  in effect (meaning this test can be used) for the duration of the COVID-19 declaration under Section 564(b)(1) of the Act, 21 U.S.C.section 360bbb-3(b)(1), unless the authorization is terminated  or revoked sooner.       Influenza A by PCR NEGATIVE NEGATIVE Final   Influenza B by PCR NEGATIVE NEGATIVE Final    Comment: (NOTE) The Xpert Xpress SARS-CoV-2/FLU/RSV plus assay is intended as an aid in the diagnosis of influenza from Nasopharyngeal swab specimens and should not be used as a sole basis for treatment. Nasal washings and aspirates are unacceptable for Xpert Xpress SARS-CoV-2/FLU/RSV testing.  Fact Sheet for Patients: EntrepreneurPulse.com.au  Fact Sheet for Healthcare Providers: IncredibleEmployment.be  This test is not yet approved or cleared by the [place] FDA and has been  authorized for detection and/or diagnosis of SARS-CoV-2 by FDA under an Emergency Use Authorization (EUA). This EUA will remain in effect (meaning this test can be used) for the duration of the COVID-19 declaration under Section 564(b)(1) of the Act, 21 U.S.C. section 360bbb-3(b)(1), unless the authorization is terminated or revoked.     Resp Syncytial Virus by PCR NEGATIVE NEGATIVE Final    Comment: (NOTE) Fact Sheet for Patients: EntrepreneurPulse.com.au  Fact Sheet for Healthcare Providers: IncredibleEmployment.be  This test is not yet approved or cleared by the [place] FDA and has been authorized for detection and/or diagnosis  of SARS-CoV-2 by FDA under an Emergency Use Authorization (EUA). This EUA will remain in effect (meaning this test can be used) for the duration of the COVID-19 declaration under Section 564(b)(1) of the Act, 21 U.S.C. section 360bbb-3(b)(1), unless the authorization is terminated or revoked.  Performed at [facility], [place] [address], [place], [place] [address]   Blood Culture (routine x 2)     Status: None   Collection Time: 10/12/22  4:35 AM   Specimen: BLOOD  Result Value Ref Range Status   Specimen Description   Final    BLOOD LEFT ANTECUBITAL Performed at [facility] [address] [place], [place] [address]    Special Requests   Final    BOTTLES DRAWN AEROBIC AND ANAEROBIC Blood Culture adequate volume Performed at [facility] [address] [place], [place] [address]    Culture   Final    NO GROWTH 5 DAYS Performed at [facility], [place] [address], [place], [place] [address]    Report Status 10/17/2022 FINAL  Final  Blood Culture (routine x 2)     Status: None   Collection Time: 10/12/22  4:45 AM   Specimen: BLOOD  Result Value Ref Range Status   Specimen Description   Final    BLOOD RIGHT ANTECUBITAL Performed at [facility] [address] [place], [place] [address]    Special Requests   Final    BOTTLES DRAWN AEROBIC AND ANAEROBIC Blood Culture results may not be optimal due to an excessive volume of blood received in culture bottles Performed at [facility] [address] [place], [place] [address]    Culture   Final    NO GROWTH 5 DAYS Performed at [facility], [place] [address] [place], [place] [address]    Report Status 10/17/2022 FINAL  Final  Urine Culture (for pregnant, neutropenic or urologic patients or patients with an indwelling urinary catheter)     Status: None   Collection Time: 10/12/22  6:43 AM   Specimen: Urine, Clean Catch  Result Value Ref Range Status   Specimen Description   Final    URINE, CLEAN CATCH Performed at [facility], [place] [address], [place], [place] [address]    Special Requests   Final    NONE Performed at [facility], [place] [address], [place], [place] [address]    Culture   Final    NO GROWTH Performed at [facility], [place] [address], [place], [place] [address]    Report Status 10/13/2022 FINAL  Final      Radiology Studies: DG Abd 1 View  Result Date: 10/18/2022 CLINICAL DATA:  Abdominal pain EXAM: ABDOMEN - 1 VIEW COMPARISON:  None Available. FINDINGS: Scattered large and small bowel gas is noted. No obstructive changes are seen. No free air is noted. Degenerative changes of the lumbar spine are seen. IMPRESSION: No acute abnormality noted. Electronically Signed   By: Inez Catalina M.D.   On: 10/18/2022 18:03      Scheduled Meds:  acetaminophen  1,000 mg Oral Q8H   acidophilus  1 capsule Oral Daily   apixaban  2.5 mg Oral BID   Chlorhexidine Gluconate Cloth  6 each Topical Daily   citalopram  40 mg Oral Daily   feeding supplement  237 mL Oral TID BM   levothyroxine  75 mcg Oral Q0600   multivitamin with minerals  1 tablet Oral Daily   potassium chloride  40 mEq  Oral BID   pregabalin  25 mg Oral Daily   senna-docusate  1 tablet Oral BID   Continuous Infusions:   ceFAZolin (ANCEF) IV     dextrose 5 % and 0.9% NaCl 75 mL/hr at 10/19/22 0844     LOS: 7 days   Time spent: 25 minutes   Dessa Phi, DO [facility] 10/19/2022, 12:09 PM   Available via Epic secure chat 7am-7pm After these hours, please refer to coverage provider listed on [email or web address]

## 2022-10-19 NOTE — Progress Notes (Signed)
Pharmacist Chemotherapy Monitoring - Initial Assessment    Anticipated start date: 10/26/22   The following has been reviewed per standard work regarding the patient's treatment regimen: The patient's diagnosis, treatment plan and drug doses, and organ/hematologic function Lab orders and baseline tests specific to treatment regimen  The treatment plan start date, drug sequencing, and pre-medications Prior authorization status  Patient's documented medication list, including drug-drug interaction screen and prescriptions for anti-emetics and supportive care specific to the treatment regimen The drug concentrations, fluid compatibility, administration routes, and timing of the medications to be used The patient's access for treatment and lifetime cumulative dose history, if applicable  The patient's medication allergies and previous infusion related reactions, if applicable   Changes made to treatment plan:  N/A  Follow up needed:  N/A   Larene Beach, RPH, 10/19/2022  1:07 PM

## 2022-10-19 NOTE — Progress Notes (Signed)
Nutrition Follow-up  DOCUMENTATION CODES:   Severe malnutrition in context of chronic illness, Underweight  INTERVENTION:   -Multivitamin w/ minerals daily  -Ensure Enlive po TID, each supplement provides 350 kcal and 20 grams of protein.  -Mighty Shake BID with meals, each supplement provides 330 kcals and 9 grams of protein  NEW NUTRITION DIAGNOSIS:   Severe Malnutrition related to chronic illness, cancer and cancer related treatments as evidenced by percent weight loss, moderate fat depletion, severe muscle depletion.  GOAL:   Patient will meet greater than or equal to 90% of their needs  Progressing.  MONITOR:   PO intake, Supplement acceptance, Weight trends, Labs  ASSESSMENT:   62 y.o. female presented to the ED with confusion and weakness. PMH includes metastatic breast cancer to the cervical spine and brain. Pt admitted with hallucination of unknown etiology.  Patient in bed, c-collar in place. Just received her lunch tray and states she plans to eat it soon. Has some trouble given her back pain. She doesn't really like the hospital food. Has been drinking her Ensure consistently. At home she states she drinks Ensure and Fairlife. Uses collagen protein powder. Eats yogurt with granola. Appetite was off while having increased confusion. Next chemotherapy planned for 2/29. Undergoing radiation to brain.   Pt states her UBW ~120-125 lbs. Current weight: 88 lbs. Significant weight loss noted in RD note from 2/17.  Medications: Risaquad, Multivitamin with minerals daily, KLOR-CON, Senokot  Labs reviewed: Low Na Low K   NUTRITION - FOCUSED PHYSICAL EXAM:  Flowsheet Row Most Recent Value  Orbital Region Moderate depletion  Upper Arm Region Severe depletion  Thoracic and Lumbar Region Unable to assess  Buccal Region Moderate depletion  Temple Region Moderate depletion  Clavicle Bone Region Moderate depletion  Clavicle and Acromion Bone Region Unable to assess   Scapular Bone Region Unable to assess  [pain in back, c-collar]  Dorsal Hand Severe depletion  Patellar Region Severe depletion  Anterior Thigh Region Severe depletion  Posterior Calf Region Severe depletion  Edema (RD Assessment) None  Hair Reviewed  Eyes Reviewed  Mouth Reviewed  Skin Reviewed       Diet Order:   Diet Order             Diet regular Room service appropriate? Yes; Fluid consistency: Thin  Diet effective now                   EDUCATION NEEDS:   Not appropriate for education at this time  Skin:  Skin Assessment: Reviewed RN Assessment  Last BM:  2/22 -type 5  Height:   Ht Readings from Last 1 Encounters:  10/12/22 5' 2"$  (1.575 m)    Weight:   Wt Readings from Last 1 Encounters:  10/12/22 40.3 kg    Ideal Body Weight:  50 kg  BMI:  Body mass index is 16.25 kg/m.  Estimated Nutritional Needs:   Kcal:  1500-1700  Protein:  75-90 grams  Fluid:  >/= 1.5 L  Clayton Bibles, MS, RD, LDN Inpatient Clinical Dietitian Contact information available via Amion

## 2022-10-19 NOTE — Progress Notes (Signed)
Daily Progress Note   Patient Name: Martha [name]       Date: 10/19/2022 DOB: May 13, 1961  Age: 62 y.o. MRN#: [id number] Attending Physician: Dessa Phi, DO Primary Care Physician: [name], MD Admit Date: 10/12/2022 Length of Stay: 7 days  Reason for Consultation/Follow-up: Establishing goals of care and Symptom Management  Subjective:   CC: Patient is wearing her neck collar, awake alert oriented, denies any complaints, feels like she might have a bowel movement today Discussed with her husband on phone yesterday as well.  Following up regarding complex medical decision making and symptom management.   Subjective:  At time of EMR review, patient is on scheduled Tylenol given orally, also on PRN dose of 5 mg of oxycodone.   Awake alert, resting in bed.  Discussed about symptom management.      All questions answered at that time. Thanked patient for allowing this provider to visit with her today.   Review of Systems  Objective:   Vital Signs:  BP (!) 150/81   Pulse (!) 101   Temp 98.5 F (36.9 C) (Oral)   Resp 16   Ht 5' 2"$  (1.575 m)   Wt 40.3 kg   SpO2 96%   BMI 16.25 kg/m   Physical Exam: General: NAD, alert, laying in bed, pleasant  Eyes: no drainage noted HENT: dry mucous membranes Cardiovascular: RRR, no edema in LE b/l Respiratory: no increased work of breathing noted, not in respiratory distress Abdomen: not distended Extremities: moving all appropriately  Skin: no rashes or lesions on visible skin Neuro: A&Ox4, following commands easily Psych: appropriately answers all questions  Imaging:  I personally reviewed recent imaging.   Assessment & Plan:   Assessment: Palliative Care consult requested for goals of care discussion in this 62 y.o. female  with past medical history of ER+ breast cancer (01/2018), right breast cancer (04/2021), now with metastatic disease progression involving brain and liver, pathological fractures with osseous  mets s/p brain radiation. She was admitted on 10/12/2022 from home with confusion and weakness. Recently discharged from [place] on 1/21 after cervical spine surgery.    Recommendations/Plan: # Complex medical decision making/goals of care:  - DNR/DNI status was confirmed by patient and husband on 10/15/22.  - Continue with current plan of care. Patient and husband clear in expressed wishes to continue to treat the treatable aggressively. They are realistic in their understanding of current disease progression and poor long-term prognosis. Hopeful for the ability to continue to thrive and start chemotherapy.  - Patient previously stated that if she is unable to make medical decisions for herself, she would want her husband to make medical decision for her.   -  Code Status: DNR  # Symptom management:  -Pain, acute on chronic in setting on metastatic breast cancer continue Tylenol 158m q8hrs scheduled during the day -continue oxycodone 2.5-570mq4hrs prn   Lyrica 2530maily in AM    -Anxiety/Depression   -  po Ativan 0.5mg54mhrs prn   -Continue Celexa 40mg7mly   -Constipation   -Miralax as needed   -Continue Senna 1 tab twice daily   -Nausea   -Zofran as needed  # Discharge Planning: TBD  -Needs follow up with outpatient PMT at [facility] is [facility] for [facility]th Feb when she comes in for chemo, it is on the schedule and she needs to be there at 730am   Discussed with: IDT  Thank you for allowing the palliative care team to participate  in the care [name]. Mod MDM.  Loistine Chance MD Palliative Care Provider PMT # [phone]  If patient remains symptomatic despite maximum doses, please call PMT at [phone] between 0700 and 1900. Outside of these hours, please call attending, as PMT does not have night coverage.

## 2022-10-20 DIAGNOSIS — E876 Hypokalemia: Secondary | ICD-10-CM | POA: Insufficient documentation

## 2022-10-20 DIAGNOSIS — R443 Hallucinations, unspecified: Secondary | ICD-10-CM | POA: Diagnosis not present

## 2022-10-20 LAB — COMPREHENSIVE METABOLIC PANEL
ALT: 48 U/L — ABNORMAL HIGH (ref 0–44)
AST: 105 U/L — ABNORMAL HIGH (ref 15–41)
Albumin: 2.9 g/dL — ABNORMAL LOW (ref 3.5–5.0)
Alkaline Phosphatase: 1349 U/L — ABNORMAL HIGH (ref 38–126)
Anion gap: 10 (ref 5–15)
BUN: 9 mg/dL (ref 8–23)
CO2: 21 mmol/L — ABNORMAL LOW (ref 22–32)
Calcium: 8.5 mg/dL — ABNORMAL LOW (ref 8.9–10.3)
Chloride: 100 mmol/L (ref 98–111)
Creatinine, Ser: <0.3 mg/dL — ABNORMAL LOW (ref 0.44–1.00)
Glucose, Bld: 103 mg/dL — ABNORMAL HIGH (ref 70–99)
Potassium: 4.1 mmol/L (ref 3.5–5.1)
Sodium: 131 mmol/L — ABNORMAL LOW (ref 135–145)
Total Bilirubin: 1.1 mg/dL (ref 0.3–1.2)
Total Protein: 5.5 g/dL — ABNORMAL LOW (ref 6.5–8.1)

## 2022-10-20 LAB — MAGNESIUM: Magnesium: 1.6 mg/dL — ABNORMAL LOW (ref 1.7–2.4)

## 2022-10-20 MED ORDER — ACETAMINOPHEN 500 MG PO TABS: 1000.0000 mg | ORAL_TABLET | Freq: Three times a day (TID) | ORAL | 0 refills | Status: AC

## 2022-10-20 MED ORDER — LORAZEPAM 0.5 MG PO TABS: 0.5000 mg | ORAL_TABLET | Freq: Four times a day (QID) | ORAL | 0 refills | Status: DC | PRN

## 2022-10-20 MED ORDER — HEPARIN SOD (PORK) LOCK FLUSH 100 UNIT/ML IV SOLN
500.0000 [IU] | Freq: Once | INTRAVENOUS | Status: AC
  Administered 2022-10-20: 500 [IU] via INTRAVENOUS
  Filled 2022-10-20: qty 5

## 2022-10-20 MED ORDER — PREGABALIN 25 MG PO CAPS: 25.0000 mg | ORAL_CAPSULE | Freq: Every day | ORAL | 0 refills | Status: DC

## 2022-10-20 MED ORDER — OXYCODONE HCL 5 MG PO TABS: 2.5000 mg | ORAL_TABLET | ORAL | 0 refills | Status: DC | PRN

## 2022-10-20 MED ORDER — MAGNESIUM SULFATE 2 GM/50ML IV SOLN
2.0000 g | Freq: Once | INTRAVENOUS | Status: AC
  Administered 2022-10-20: 2 g via INTRAVENOUS
  Filled 2022-10-20: qty 50

## 2022-10-20 NOTE — Discharge Summary (Signed)
Physician Discharge Summary  Natajia Suddreth [id number] DOB: 04-19-1961 DOA: 10/12/2022  PCP: [name], MD  Admit date: 10/12/2022 Discharge date: 10/20/2022  Admitted From: Home Disposition:  Home with home health   Recommendations for Outpatient Follow-up:  Follow up with PCP, oncology Repeat CMP and magnesium as outpatient.  Patient's hypokalemia and hypomagnesemia have been replaced in the hospital.  Discharge Condition: Stable CODE STATUS: DNR  Diet recommendation: Regular  Brief/Interim Summary: Martha [name] is a 62 y.o. female with PMHx anxiety, depression, dysrhythmia, ER positive breast cancer diagnosed 12/2017 with metastasis to cervical spine and brain discovered in July 2023.    She was admitted to the hospital for evaluation and management of confusion and weakness.  The patient was recently discharged from [facility] health inpatient rehab on 1/31.  She is currently undergoing radiation to her brain.  She has some chronic dizziness, and sensation of falling out of the bed. Husband states that this has been going on for several weeks especially after her more recent cervical spine surgery in January 2024.  This problem seems to come and go, has been worse in the last 2 to 3 days.  Patient's husband also states that she has been somewhat confused and having some hallucinations, for example seeing people in her room and even this morning here in the hospital who are not present.  She has never really had this problem before, started in the last 3 to 4 days.  Per the patient and her husband, it seems that this confusion is somewhat transient, does not last very long.  While she was in rehab, her Celexa dose was increased to 40 mg, her gabapentin was titrated up to 200 mg p.o. 3 times daily.  There have been no other significant medication changes, and there has not been any medication changes since discharge from rehab.  Overall patient and her husband feel that she has  been doing well considering what she has been through, she continues to ambulate with a walker, she is supposed to start outpatient therapy soon.   ED Course: Husband called EMS and the patient was brought to the emergency department for evaluation, where she has normal vital signs and is stable on room air.  So far lab work reveals normal white blood cell count, normal hemoglobin, platelets 128.  Sodium 132, potassium 3.4.  Renal function is normal.  Calcium 12.7.  Lactate is normal at 1.4.  Patient was seen by oncology, palliative care medicine as well as interventional radiology.  She had placement of right IJ port placed 2/19.  Since encephalopathy improved and resolved.  Discharge Diagnoses:   Principal Problem:   Hallucination Active Problems:   Malignant neoplasm of lower-inner quadrant of left breast in female, estrogen receptor positive ([place])   Cervicalgia   Cancer, metastatic to bone ([name])   Medication management   Change in mental status   AMS (altered mental status)   Depression   Cancer associated pain   DNR (do not resuscitate)   High risk medication use   Goals of care, counseling/discussion   Palliative care encounter   Protein-calorie malnutrition, severe ([place])   Hypokalemia   Hypomagnesemia   Metastatic breast cancer with mets to brain and C-spine -Followed by Dr. Burr Medico -Status post cervical spine surgery, brain radiation, chemotherapy -Dr. Burr Medico rescheduled chemotherapy [id number]   Acute metabolic encephalopathy -Multifactorial in setting of brain mets, pain medication, ?  Infection -Patient has had waxing and waning hallucinations -Resolved  Hypomagnesemia -Replace   Anxiety and depression -Celexa, Ativan as needed   Hypothyroidism -Synthroid    Discharge Instructions  Discharge Instructions     Amb Referral to Palliative Care   Complete by: As directed    Call MD for:  difficulty breathing, headache or visual disturbances   Complete by: As  directed    Call MD for:  extreme fatigue   Complete by: As directed    Call MD for:  persistant dizziness or light-headedness   Complete by: As directed    Call MD for:  persistant nausea and vomiting   Complete by: As directed    Call MD for:  severe uncontrolled pain   Complete by: As directed    Call MD for:  temperature >100.4   Complete by: As directed    Diet general   Complete by: As directed    Discharge instructions   Complete by: As directed    .[email or web address]   Increase activity slowly   Complete by: As directed    No wound care   Complete by: As directed       Allergies as of 10/20/2022       Reactions   Morphine And Related Other (See Comments)   "Makes her angry"        Medication List     STOP taking these medications    ascorbic acid 1000 MG tablet Commonly known as: VITAMIN C   cyclobenzaprine 10 MG tablet Commonly known as: FLEXERIL   gabapentin 100 MG capsule Commonly known as: Neurontin   HYDROcodone-acetaminophen 5-325 MG tablet Commonly known as: NORCO/VICODIN   prochlorperazine 10 MG tablet Commonly known as: COMPAZINE       TAKE these medications    acetaminophen 500 MG tablet Commonly known as: TYLENOL Take 2 tablets (1,000 mg total) by mouth every 8 (eight) hours. What changed: when to take this   apixaban 2.5 MG Tabs tablet Commonly known as: ELIQUIS Take 1 tablet (2.5 mg total) by mouth 2 (two) times daily.   citalopram 40 MG tablet Commonly known as: CELEXA Take 1 tablet (40 mg total) by mouth daily.   levothyroxine 75 MCG tablet Commonly known as: SYNTHROID Take 1 tablet (75 mcg total) by mouth daily before breakfast.   lidocaine 2 % solution Commonly known as: XYLOCAINE Patient: Mix 1part 2% viscous lidocaine, 1part H20. Swallow 47m of diluted mixture, 359m before meals and at bedtime, up to QID   lidocaine-prilocaine cream Commonly known as: EMLA Apply to affected area once What changed:  how much to  take how to take this when to take this reasons to take this additional instructions   LORazepam 0.5 MG tablet Commonly known as: ATIVAN Take 1 tablet (0.5 mg total) by mouth every 6 (six) hours as needed for anxiety (agitation).   mouth rinse Liqd solution 15 mLs by Mouth Rinse route as needed (for oral care).   OVER THE COUNTER MEDICATION Take 1 tablet by mouth daily. Protandim Supplement   oxyCODONE 5 MG immediate release tablet Commonly known as: Oxy IR/ROXICODONE Take 0.5-1 tablets (2.5-5 mg total) by mouth every 4 (four) hours as needed for moderate pain or severe pain (dyspnea).   pregabalin 25 MG capsule Commonly known as: LYRICA Take 1 capsule (25 mg total) by mouth daily. Start taking on: October 21, 2022   senna-docusate 8.6-50 MG tablet Commonly known as: Senokot-S Take 1 tablet by mouth 2 (two) times daily.        Follow-up Information  [facility], [facility] Follow up.   Specialty: Home Health Services Why: Your home health has been set up with [facility]. The office will call you with start of service information. Please call the number listed above with any questions or concerns. Contact information: [address] [place] [address] [phone]                Allergies  Allergen Reactions   Morphine And Related Other (See Comments)    "Makes her angry"     Procedures/Studies: DG Abd 1 View  Result Date: 10/18/2022 CLINICAL DATA:  Abdominal pain EXAM: ABDOMEN - 1 VIEW COMPARISON:  None Available. FINDINGS: Scattered large and small bowel gas is noted. No obstructive changes are seen. No free air is noted. Degenerative changes of the lumbar spine are seen. IMPRESSION: No acute abnormality noted. Electronically Signed   By: Inez Catalina M.D.   On: 10/18/2022 18:03   IR IMAGING GUIDED PORT INSERTION  Result Date: 10/17/2022 INDICATION: 62 year old female with widespread metastatic breast cancer. She presents for  establishment of durable venous access. EXAM: IMPLANTED PORT A CATH PLACEMENT WITH ULTRASOUND AND FLUOROSCOPIC GUIDANCE MEDICATIONS: None. ANESTHESIA/SEDATION: Versed 2 mg IV; Fentanyl 100 mcg IV; Moderate Sedation Time:  15 minutes The patient's vital signs and level of consciousness were continuously monitored during the procedure by the interventional radiology nurse under my direct supervision. FLUOROSCOPY: Radiation exposure index: 1 mGy reference air kerma COMPLICATIONS: None immediate. PROCEDURE: The right neck and chest was prepped with chlorhexidine, and draped in the usual sterile fashion using maximum barrier technique (cap and mask, sterile gown, sterile gloves, large sterile sheet, hand hygiene and cutaneous antiseptic). Local anesthesia was attained by infiltration with 1% lidocaine with epinephrine. Ultrasound demonstrated patency of the right internal jugular vein, and this was documented with an image. Under real-time ultrasound guidance, this vein was accessed with a 21 gauge micropuncture needle and image documentation was performed. A small dermatotomy was made at the access site with an 11 scalpel. A 0.018" wire was advanced into the SVC and the access needle exchanged for a 21F micropuncture vascular sheath. The 0.018" wire was then removed and a 0.035" wire advanced into the IVC. An appropriate location for the subcutaneous reservoir was selected below the clavicle and an incision was made through the skin and underlying soft tissues. The subcutaneous tissues were then dissected using a combination of blunt and sharp surgical technique and a pocket was formed. A low-profile single lumen power injectable portacatheter was then tunneled through the subcutaneous tissues from the pocket to the dermatotomy and the port reservoir placed within the subcutaneous pocket. The venous access site was then serially dilated and a peel away vascular sheath placed over the wire. The wire was removed and the  port catheter advanced into position under fluoroscopic guidance. The catheter tip is positioned in the superior cavoatrial junction. This was documented with a spot image. The portacatheter was then tested and found to flush and aspirate well. The port was flushed with saline followed by 100 units/mL heparinized saline. The pocket was then closed in two layers using first subdermal inverted interrupted absorbable sutures followed by a running subcuticular suture. The epidermis was then sealed with Dermabond. The dermatotomy at the venous access site was also closed with Dermabond. IMPRESSION: Successful placement of a right IJ approach Power Port with ultrasound and fluoroscopic guidance. The catheter is ready for use. Electronically Signed   By: Jacqulynn Cadet M.D.   On: 10/17/2022  08:06   DG Chest 2 View  Result Date: 10/16/2022 CLINICAL DATA:  Abdominal pain EXAM: CHEST - 2 VIEW COMPARISON:  Chest radiograph dated 10/12/2022 FINDINGS: Low lung volumes. Bilateral lower lung hazy and patchy opacities with dense left retrocardiac opacities. Small bilateral pleural effusions. No pneumothorax. The heart size and mediastinal contours are within normal limits. Cervical spinal fixation hardware appears intact. Asymmetrically elevated left scapula, likely related to patient positioning. Surgical clip projects over the left upper quadrant. IMPRESSION: 1. Bilateral lower lung hazy and patchy opacities with dense left retrocardiac opacities, which could represent atelectasis, infection, or aspiration. 2. Small bilateral pleural effusions. Electronically Signed   By: Darrin Nipper M.D.   On: 10/16/2022 15:53   ECHOCARDIOGRAM COMPLETE  Result Date: 10/15/2022    ECHOCARDIOGRAM REPORT   Patient Name:   Martha [name] [name] Date of Exam: 10/15/2022 Medical Rec #:  [id number]             Height:       62.0 in Accession #:    [id number]            Weight:       88.8 lb Date of Birth:  [birth year]             BSA:           1.354 m Patient Age:    58 years              BP:           119/75 mmHg Patient Gender: F                     HR:           90 bpm. Exam Location:  Inpatient Procedure: 2D Echo, 3D Echo, Cardiac Doppler, Color Doppler and Strain Analysis Indications:    chemo  History:        Patient has no prior history of Echocardiogram examinations.  Sonographer:    Phineas Douglas Referring Phys: [id number] [place]  1. Left ventricular ejection fraction, by estimation, is 55 to 60%. The left ventricle has normal function. The left ventricle has no regional wall motion abnormalities. Left ventricular diastolic parameters are consistent with Grade I diastolic dysfunction (impaired relaxation). The average left ventricular global longitudinal strain is -14.9 %. The global longitudinal strain is abnormal.  2. Right ventricular systolic function is normal. The right ventricular size is normal. There is mildly elevated pulmonary artery systolic pressure.  3. The mitral valve is normal in structure. Trivial mitral valve regurgitation. No evidence of mitral stenosis.  4. The aortic valve is tricuspid. Aortic valve regurgitation is not visualized. No aortic stenosis is present.  5. The inferior vena cava is normal in size with <50% respiratory variability, suggesting right atrial pressure of 8 mmHg. FINDINGS  Left Ventricle: Left ventricular ejection fraction, by estimation, is 55 to 60%. The left ventricle has normal function. The left ventricle has no regional wall motion abnormalities. The average left ventricular global longitudinal strain is -14.9 %. The global longitudinal strain is abnormal. The left ventricular internal cavity size was normal in size. There is no left ventricular hypertrophy. Left ventricular diastolic parameters are consistent with Grade I diastolic dysfunction (impaired relaxation). Indeterminate filling pressures. Right Ventricle: The right ventricular size is normal. No increase in right  ventricular wall thickness. Right ventricular systolic function is normal. There is mildly elevated pulmonary artery systolic pressure. The tricuspid regurgitant velocity is 2.67  m/s, and with an assumed right atrial pressure of 8 mmHg, the estimated right ventricular systolic pressure is [id number] mmHg. Left Atrium: Left atrial size was normal in size. Right Atrium: Right atrial size was normal in size. Pericardium: There is no evidence of pericardial effusion. Mitral Valve: The mitral valve is normal in structure. Trivial mitral valve regurgitation. No evidence of mitral valve stenosis. Tricuspid Valve: The tricuspid valve is normal in structure. Tricuspid valve regurgitation is trivial. No evidence of tricuspid stenosis. Aortic Valve: The aortic valve is tricuspid. Aortic valve regurgitation is not visualized. No aortic stenosis is present. Pulmonic Valve: The pulmonic valve was normal in structure. Pulmonic valve regurgitation is not visualized. No evidence of pulmonic stenosis. Aorta: The aortic root is normal in size and structure. Venous: The inferior vena cava is normal in size with less than 50% respiratory variability, suggesting right atrial pressure of 8 mmHg. IAS/Shunts: No atrial level shunt detected by color flow Doppler. Additional Comments: There is a small pleural effusion in the left lateral region.  LEFT VENTRICLE PLAX 2D LVIDd:         3.50 cm     Diastology LVIDs:         2.20 cm     LV e' medial:    6.31 cm/s LV PW:         1.10 cm     LV E/e' medial:  11.9 LV IVS:        1.00 cm     LV e' lateral:   9.68 cm/s LVOT diam:     1.80 cm     LV E/e' lateral: 7.7 LV SV:         46 LV SV Index:   34          2D Longitudinal Strain LVOT Area:     2.54 cm    2D Strain GLS (A2C):   -12.7 %                            2D Strain GLS (A3C):   -17.5 %                            2D Strain GLS (A4C):   -14.6 % LV Volumes (MOD)           2D Strain GLS Avg:     -14.9 % LV vol d, MOD A2C: 68.3 ml LV vol d, MOD A4C:  79.9 ml LV vol s, MOD A2C: 24.8 ml LV vol s, MOD A4C: 31.9 ml 3D Volume EF: LV SV MOD A2C:     43.5 ml 3D EF:        64 % LV SV MOD A4C:     79.9 ml LV EDV:       113 ml LV SV MOD BP:      45.5 ml LV ESV:       41 ml                            LV SV:        72 ml RIGHT VENTRICLE             IVC RV Basal diam:  3.20 cm     IVC diam: 1.90 cm RV S prime:     14.00 cm/s TAPSE (M-mode): 2.0 cm LEFT ATRIUM  Index        RIGHT ATRIUM           Index LA diam:        2.40 cm 1.77 cm/m   RA Area:     12.60 cm LA Vol (A2C):   34.4 ml 25.40 ml/m  RA Volume:   27.20 ml  20.09 ml/m LA Vol (A4C):   38.6 ml 28.51 ml/m LA Biplane Vol: 38.1 ml 28.14 ml/m  AORTIC VALVE LVOT Vmax:   105.00 cm/s LVOT Vmean:  63.400 cm/s LVOT VTI:    0.182 m  AORTA Ao Root diam: 2.90 cm Ao Asc diam:  2.60 cm MITRAL VALVE               TRICUSPID VALVE MV Area (PHT): 3.23 cm    TR Peak grad:   28.5 mmHg MV Decel Time: 235 msec    TR Vmax:        267.00 cm/s MV E velocity: 75.00 cm/s MV A velocity: 91.70 cm/s  SHUNTS MV E/A ratio:  0.82        Systemic VTI:  0.18 m                            Systemic Diam: 1.80 cm Skeet Latch MD Electronically signed by Skeet Latch MD Signature Date/Time: 10/15/2022/1:44:12 PM    Final    MR BRAIN WO CONTRAST  Result Date: 10/12/2022 CLINICAL DATA:  Mental status change, unknown cause EXAM: MRI HEAD WITHOUT CONTRAST TECHNIQUE: Multiplanar, multiecho pulse sequences of the brain and surrounding structures were obtained without intravenous contrast. COMPARISON:  MRI head 09/13/2022. FINDINGS: Noncontrast and motion limited study.  Within these limitations: Brain: The lesion at the left CP angle is grossly similar. Additional enhancing lesions seen on the prior cannot be accurately assessed without contrast. Consider postcontrast imaging. No significant new mass effect. No obvious evidence of acute infarct, acute hemorrhage, midline shift, hydrocephalus or sizeable extra-axial fluid collection.  Vascular: Major arterial flow voids are maintained at the skull base. Skull and upper cervical spine: Extensive osseous metastatic disease. Sinuses/Orbits: Clear sinuses.  No acute findings. Other: No mastoid effusions. IMPRESSION: 1. Noncontrast and motion limited study. The lesion at the left CP angle is grossly similar. Additional enhancing lesions seen on the prior cannot be accurately assessed without contrast. Consider postcontrast imaging. 2. No significant new mass effect or other obvious acute abnormality. 3. Osseous metastatic disease. Electronically Signed   By: Margaretha Sheffield M.D.   On: 10/12/2022 12:35   CT Head Wo Contrast  Result Date: 10/12/2022 CLINICAL DATA:  Mental status change with unknown cause EXAM: CT HEAD WITHOUT CONTRAST TECHNIQUE: Contiguous axial images were obtained from the base of the skull through the vertex without intravenous contrast. RADIATION DOSE REDUCTION: This exam was performed according to the departmental dose-optimization program which includes automated exposure control, adjustment of the mA and/or kV according to patient size and/or use of iterative reconstruction technique. COMPARISON:  Brain MRI 09/13/2022 FINDINGS: Brain: No evidence of acute infarction, hemorrhage, hydrocephalus, extra-axial collection or mass lesion/mass effect. Essentially occult metastatic disease to the brain. Reference postcontrast brain MRI 09/13/2022 Vascular: No hyperdense vessel or unexpected calcification. Skull: Heterogeneity of the calvarium from known metastatic disease. No acute osseous finding. Extensive cervical fusion which is minimally covered Sinuses/Orbits: No acute finding IMPRESSION: No acute or interval finding. Electronically Signed   By: Jorje Guild M.D.   On: 10/12/2022 05:36  DG CHEST PORT 1 VIEW  Result Date: 10/12/2022 CLINICAL DATA:  Altered mental status.  History of breast cancer EXAM: PORTABLE CHEST 1 VIEW COMPARISON:  PET CT 08/03/2022 FINDINGS: Hazy  and streaky right density at the lung bases from scarring based on PET CT 08/03/2022. Trace right pleural effusion. Generous left ventricular contour extension weighted by rotation. Atheromatous calcification of the aorta. Postoperative breasts and left axilla. Scoliosis and spinal degeneration with cervical fusion. IMPRESSION: No acute finding when accounting for scarring at the lung bases with trace right pleural effusion. Electronically Signed   By: Jorje Guild M.D.   On: 10/12/2022 04:40       Discharge Exam: Vitals:   10/19/22 2110 10/20/22 0646  BP: 137/82 (!) 140/84  Pulse: 99 100  Resp: 16 16  Temp: 98.6 F (37 C) 98.1 F (36.7 C)  SpO2: 93% 97%    General: Pt is alert, awake, not in acute distress Cardiovascular: RRR, S1/S2 +, no edema Respiratory: CTA bilaterally, no wheezing, no rhonchi, no respiratory distress, no conversational dyspnea  Abdominal: Soft, NT, ND, bowel sounds + Extremities: no edema, no cyanosis Psych: Normal mood and affect, stable judgement and insight     The results of significant diagnostics from this hospitalization (including imaging, microbiology, ancillary and laboratory) are listed below for reference.     Microbiology: Recent Results (from the past 240 hour(s))  Resp panel by RT-PCR (RSV, Flu A&B, Covid) Anterior Nasal Swab     Status: None   Collection Time: 10/12/22  4:35 AM   Specimen: Anterior Nasal Swab  Result Value Ref Range Status   SARS Coronavirus 2 by RT PCR NEGATIVE NEGATIVE Final    Comment: (NOTE) SARS-CoV-2 target nucleic acids are NOT DETECTED.  The SARS-CoV-2 RNA is generally detectable in upper respiratory specimens during the acute phase of infection. The lowest concentration of SARS-CoV-2 viral copies this assay can detect is 138 copies/mL. A negative result does not preclude SARS-Cov-2 infection and should not be used as the sole basis for treatment or other patient management decisions. A negative result may  occur with  improper specimen collection/handling, submission of specimen other than nasopharyngeal swab, presence of viral mutation(s) within the areas targeted by this assay, and inadequate number of viral copies(<138 copies/mL). A negative result must be combined with clinical observations, patient history, and epidemiological information. The expected result is Negative.  Fact Sheet for Patients:  EntrepreneurPulse.com.au  Fact Sheet for Healthcare Providers:  IncredibleEmployment.be  This test is no t yet approved or cleared by the [place] FDA and  has been authorized for detection and/or diagnosis of SARS-CoV-2 by FDA under an Emergency Use Authorization (EUA). This EUA will remain  in effect (meaning this test can be used) for the duration of the COVID-19 declaration under Section 564(b)(1) of the Act, 21 U.S.C.section 360bbb-3(b)(1), unless the authorization is terminated  or revoked sooner.       Influenza A by PCR NEGATIVE NEGATIVE Final   Influenza B by PCR NEGATIVE NEGATIVE Final    Comment: (NOTE) The Xpert Xpress SARS-CoV-2/FLU/RSV plus assay is intended as an aid in the diagnosis of influenza from Nasopharyngeal swab specimens and should not be used as a sole basis for treatment. Nasal washings and aspirates are unacceptable for Xpert Xpress SARS-CoV-2/FLU/RSV testing.  Fact Sheet for Patients: EntrepreneurPulse.com.au  Fact Sheet for Healthcare Providers: IncredibleEmployment.be  This test is not yet approved or cleared by the [place] FDA and has been authorized for detection and/or diagnosis  of SARS-CoV-2 by FDA under an Emergency Use Authorization (EUA). This EUA will remain in effect (meaning this test can be used) for the duration of the COVID-19 declaration under Section 564(b)(1) of the Act, 21 U.S.C. section 360bbb-3(b)(1), unless the authorization is terminated  or revoked.     Resp Syncytial Virus by PCR NEGATIVE NEGATIVE Final    Comment: (NOTE) Fact Sheet for Patients: EntrepreneurPulse.com.au  Fact Sheet for Healthcare Providers: IncredibleEmployment.be  This test is not yet approved or cleared by the [place] FDA and has been authorized for detection and/or diagnosis of SARS-CoV-2 by FDA under an Emergency Use Authorization (EUA). This EUA will remain in effect (meaning this test can be used) for the duration of the COVID-19 declaration under Section 564(b)(1) of the Act, 21 U.S.C. section 360bbb-3(b)(1), unless the authorization is terminated or revoked.  Performed at [facility], [place] [address] [place], [place] [address]   Blood Culture (routine x 2)     Status: None   Collection Time: 10/12/22  4:35 AM   Specimen: BLOOD  Result Value Ref Range Status   Specimen Description   Final    BLOOD LEFT ANTECUBITAL Performed at [facility] [address] [place], [place] [id number]    Special Requests   Final    BOTTLES DRAWN AEROBIC AND ANAEROBIC Blood Culture adequate volume Performed at [facility] [address], [place], [place] [address]    Culture   Final    NO GROWTH 5 DAYS Performed at [facility], [place] [address], [place], [place] [address]    Report Status 10/17/2022 FINAL  Final  Blood Culture (routine x 2)     Status: None   Collection Time: 10/12/22  4:45 AM   Specimen: BLOOD  Result Value Ref Range Status   Specimen Description   Final    BLOOD RIGHT ANTECUBITAL Performed at [facility] [address], [place], [place] [address]    Special Requests   Final    BOTTLES DRAWN AEROBIC AND ANAEROBIC Blood Culture results may not be optimal due to an excessive volume of blood received in culture bottles Performed at [facility] [address], [place], [place]  [address]    Culture   Final    NO GROWTH 5 DAYS Performed at [facility], [place] [address] [place], [place] [address]    Report Status 10/17/2022 FINAL  Final  Urine Culture (for pregnant, neutropenic or urologic patients or patients with an indwelling urinary catheter)     Status: None   Collection Time: 10/12/22  6:43 AM   Specimen: Urine, Clean Catch  Result Value Ref Range Status   Specimen Description   Final    URINE, CLEAN CATCH Performed at [facility], [place] [address] [place], [place] [address]    Special Requests   Final    NONE Performed at [facility], [place] [address], [place], [place] [address]    Culture   Final    NO GROWTH Performed at [facility], [place] [address] [place],  [address]    Report Status 10/13/2022 FINAL  Final     Labs: BNP (last 3 results) No results for input(s): "BNP" in the last 8760 hours. Basic Metabolic Panel: Recent Labs  Lab 10/16/22 0512 10/16/22 1211 10/17/22 0648 10/17/22 1800 10/18/22 0607 10/19/22 0500 10/20/22 0550  NA 131* 136 136  --  136  132* 131*  K 4.3 3.8 3.0* 4.1 3.4* 3.2* 4.1  CL 104 105 102  --  105 101 100  CO2 21* 22 23  --  24 23 21*  GLUCOSE 126* 120* 126*  --  123* 126* 103*  BUN 5* <5* <5*  --  6* 6* 9  CREATININE <0.30* 0.39* <0.30*  --  <0.30* <0.30* <0.30*  CALCIUM 7.2* 7.6* 8.1*  --  7.9* 8.4* 8.5*  MG 2.5*  --  2.2 1.9 2.0 1.8 1.6*  PHOS 3.2  --  2.4* 1.6* 3.4 2.6  --    Liver Function Tests: Recent Labs  Lab 10/16/22 1211 10/17/22 0648 10/18/22 0607 10/19/22 0500 10/20/22 0550  AST 102* 96* 116* 95* 105*  ALT 34 36 57* 46* 48*  ALKPHOS 977* 970* 992* 1,006* 1,349*  BILITOT 0.7 1.0 0.8 0.9 1.1  PROT 5.6* 5.7* 5.6* 5.7* 5.5*  ALBUMIN 3.3* 3.1* 3.1* 3.2* 2.9*   No results for input(s): "LIPASE", "AMYLASE" in the last 168 hours. No results for input(s): "AMMONIA" in the last 168 hours. CBC: Recent Labs  Lab 10/15/22 0632  10/16/22 0512 10/16/22 1211 10/17/22 0648 10/18/22 0607 10/19/22 0500  WBC 6.9 6.5 6.9 7.4 7.4 10.3  NEUTROABS 6.1 5.2  --  5.7 5.5 7.7  HGB 11.0* 11.3* 11.5* 11.4* 11.6* 11.8*  HCT 33.3* 33.7* 35.2* 33.7* 34.6* 34.7*  MCV 95.4 93.6 96.4 93.4 93.0 91.3  PLT 129* 131* 125* 146* 153 175   Cardiac Enzymes: No results for input(s): "CKTOTAL", "CKMB", "CKMBINDEX", "TROPONINI" in the last 168 hours. BNP: Invalid input(s): "POCBNP" CBG: No results for input(s): "GLUCAP" in the last 168 hours. D-Dimer No results for input(s): "DDIMER" in the last 72 hours. Hgb A1c No results for input(s): "HGBA1C" in the last 72 hours. Lipid Profile No results for input(s): "CHOL", "HDL", "LDLCALC", "TRIG", "CHOLHDL", "LDLDIRECT" in the last 72 hours. Thyroid function studies No results for input(s): "TSH", "T4TOTAL", "T3FREE", "THYROIDAB" in the last 72 hours.  Invalid input(s): "FREET3" Anemia work up No results for input(s): "VITAMINB12", "FOLATE", "FERRITIN", "TIBC", "IRON", "RETICCTPCT" in the last 72 hours. Urinalysis    Component Value Date/Time   COLORURINE YELLOW 10/12/2022 0557   APPEARANCEUR HAZY (A) 10/12/2022 0557   LABSPEC 1.017 10/12/2022 0557   PHURINE 5.0 10/12/2022 0557   GLUCOSEU NEGATIVE 10/12/2022 0557   HGBUR NEGATIVE 10/12/2022 0557   BILIRUBINUR NEGATIVE 10/12/2022 0557   KETONESUR 5 (A) 10/12/2022 0557   PROTEINUR NEGATIVE 10/12/2022 0557   NITRITE NEGATIVE 10/12/2022 0557   LEUKOCYTESUR NEGATIVE 10/12/2022 0557   Sepsis Labs Recent Labs  Lab 10/16/22 1211 10/17/22 0648 10/18/22 0607 10/19/22 0500  WBC 6.9 7.4 7.4 10.3   Microbiology Recent Results (from the past 240 hour(s))  Resp panel by RT-PCR (RSV, Flu A&B, Covid) Anterior Nasal Swab     Status: None   Collection Time: 10/12/22  4:35 AM   Specimen: Anterior Nasal Swab  Result Value Ref Range Status   SARS Coronavirus 2 by RT PCR NEGATIVE NEGATIVE Final    Comment: (NOTE) SARS-CoV-2 target nucleic  acids are NOT DETECTED.  The SARS-CoV-2 RNA is generally detectable in upper respiratory specimens during the acute phase of infection. The lowest concentration of SARS-CoV-2 viral copies this assay can detect is 138 copies/mL. A negative result does not preclude SARS-Cov-2 infection and should not be used as the sole basis for treatment or other patient management decisions. A negative result may occur with  improper specimen collection/handling, submission of specimen  other than nasopharyngeal swab, presence of viral mutation(s) within the areas targeted by this assay, and inadequate number of viral copies(<138 copies/mL). A negative result must be combined with clinical observations, patient history, and epidemiological information. The expected result is Negative.  Fact Sheet for Patients:  EntrepreneurPulse.com.au  Fact Sheet for Healthcare Providers:  IncredibleEmployment.be  This test is no t yet approved or cleared by the [place] FDA and  has been authorized for detection and/or diagnosis of SARS-CoV-2 by FDA under an Emergency Use Authorization (EUA). This EUA will remain  in effect (meaning this test can be used) for the duration of the COVID-19 declaration under Section 564(b)(1) of the Act, 21 U.S.C.section 360bbb-3(b)(1), unless the authorization is terminated  or revoked sooner.       Influenza A by PCR NEGATIVE NEGATIVE Final   Influenza B by PCR NEGATIVE NEGATIVE Final    Comment: (NOTE) The Xpert Xpress SARS-CoV-2/FLU/RSV plus assay is intended as an aid in the diagnosis of influenza from Nasopharyngeal swab specimens and should not be used as a sole basis for treatment. Nasal washings and aspirates are unacceptable for Xpert Xpress SARS-CoV-2/FLU/RSV testing.  Fact Sheet for Patients: EntrepreneurPulse.com.au  Fact Sheet for Healthcare Providers: IncredibleEmployment.be  This  test is not yet approved or cleared by the [place] FDA and has been authorized for detection and/or diagnosis of SARS-CoV-2 by FDA under an Emergency Use Authorization (EUA). This EUA will remain in effect (meaning this test can be used) for the duration of the COVID-19 declaration under Section 564(b)(1) of the Act, 21 U.S.C. section 360bbb-3(b)(1), unless the authorization is terminated or revoked.     Resp Syncytial Virus by PCR NEGATIVE NEGATIVE Final    Comment: (NOTE) Fact Sheet for Patients: EntrepreneurPulse.com.au  Fact Sheet for Healthcare Providers: IncredibleEmployment.be  This test is not yet approved or cleared by the [place] FDA and has been authorized for detection and/or diagnosis of SARS-CoV-2 by FDA under an Emergency Use Authorization (EUA). This EUA will remain in effect (meaning this test can be used) for the duration of the COVID-19 declaration under Section 564(b)(1) of the Act, 21 U.S.C. section 360bbb-3(b)(1), unless the authorization is terminated or revoked.  Performed at [facility], [place] [address] [place], [place] [address]   Blood Culture (routine x 2)     Status: None   Collection Time: 10/12/22  4:35 AM   Specimen: BLOOD  Result Value Ref Range Status   Specimen Description   Final    BLOOD LEFT ANTECUBITAL Performed at [facility] [address] [place], [place] [address]    Special Requests   Final    BOTTLES DRAWN AEROBIC AND ANAEROBIC Blood Culture adequate volume Performed at [facility] [address] [place], [place] [address]    Culture   Final    NO GROWTH 5 DAYS Performed at [facility], [place] [address], [place], [place] [address]    Report Status 10/17/2022 FINAL  Final  Blood Culture (routine x 2)     Status: None   Collection Time: 10/12/22  4:45 AM   Specimen: BLOOD  Result Value Ref Range Status    Specimen Description   Final    BLOOD RIGHT ANTECUBITAL Performed at [facility] [address], [place], [place] [address]    Special Requests   Final    BOTTLES DRAWN AEROBIC AND ANAEROBIC Blood Culture results may not be optimal due to an excessive  volume of blood received in culture bottles Performed at [facility] [address], [place], [place] [address]    Culture   Final    NO GROWTH 5 DAYS Performed at [facility], [place] [address], [place], [place] [address]    Report Status 10/17/2022 FINAL  Final  Urine Culture (for pregnant, neutropenic or urologic patients or patients with an indwelling urinary catheter)     Status: None   Collection Time: 10/12/22  6:43 AM   Specimen: Urine, Clean Catch  Result Value Ref Range Status   Specimen Description   Final    URINE, CLEAN CATCH Performed at [facility], [place] [address] [place], [place] [address]    Special Requests   Final    NONE Performed at [facility], [place] [address], [place], [place] [address]    Culture   Final    NO GROWTH Performed at [facility], [place] [address], [place], [place] [address]    Report Status 10/13/2022 FINAL  Final     Patient was seen and examined on the day of discharge and was found to be in stable condition. Time coordinating discharge: 35 minutes including assessment and coordination of care, as well as examination of the patient.   SIGNED:  Dessa Phi, DO [facility] 10/20/2022, 10:39 AM

## 2022-10-20 NOTE — Plan of Care (Signed)
Patient AOX4, VSS throughout shift.  All meds given on time as ordered.  Diminished lungs, IS encouraged.  Cervical collar intact and in place.  Pt denied pain and SOB.  Purewick remains in place.  POC maintained, will continue to monitor.  Problem: Education: Goal: Knowledge of General Education information will improve Description: Including pain rating scale, medication(s)/side effects and non-pharmacologic comfort measures Outcome: Progressing   Problem: Health Behavior/Discharge Planning: Goal: Ability to manage health-related needs will improve Outcome: Progressing   Problem: Clinical Measurements: Goal: Ability to maintain clinical measurements within normal limits will improve Outcome: Progressing Goal: Will remain free from infection Outcome: Progressing Goal: Diagnostic test results will improve Outcome: Progressing Goal: Respiratory complications will improve Outcome: Progressing Goal: Cardiovascular complication will be avoided Outcome: Progressing   Problem: Activity: Goal: Risk for activity intolerance will decrease Outcome: Progressing   Problem: Nutrition: Goal: Adequate nutrition will be maintained Outcome: Progressing   Problem: Coping: Goal: Level of anxiety will decrease Outcome: Progressing   Problem: Elimination: Goal: Will not experience complications related to bowel motility Outcome: Progressing Goal: Will not experience complications related to urinary retention Outcome: Progressing   Problem: Pain Managment: Goal: General experience of comfort will improve Outcome: Progressing   Problem: Safety: Goal: Ability to remain free from injury will improve Outcome: Progressing   Problem: Skin Integrity: Goal: Risk for impaired skin integrity will decrease Outcome: Progressing

## 2022-10-23 ENCOUNTER — Telehealth: Payer: Self-pay

## 2022-10-23 NOTE — Transitions of Care (Post Inpatient/ED Visit) (Signed)
   10/23/2022  Name: Martha [name] MRN: [id number] DOB: 1961/02/24  Today's TOC FU Call Status: Today's TOC FU Call Status:: Successful TOC FU Call Competed TOC FU Call Complete Date: 10/23/22  Transition Care Management Follow-up Telephone Call Date of Discharge: 10/20/22 Discharge Facility: [facility]) Type of Discharge: Inpatient Admission Primary Inpatient Discharge Diagnosis:: "AMS,hallucinations" How have you been since you were released from the hospital?: Better (pt reports mental sttaus has been better/improved. She has been having some pain to her right side that she was told related to cancer. She is taking pain med(only a half a tab) with relief.) Any questions or concerns?: No  Items Reviewed: Did you receive and understand the discharge instructions provided?: Yes Medications obtained and verified?: Yes (Medications Reviewed) Any new allergies since your discharge?: No Dietary orders reviewed?: Yes Type of Diet Ordered:: regular as tolerated Do you have support at home?: Yes People in Home: spouse Name of Support/Comfort Primary Source: [name], pt also paid caregiver that comes in a few hrs per day to assit her while spouse is at work  Home Care and Equipment/Supplies: [place] Ordered?: Yes Name of [place]:: [place] set up a time to come to your home?: No (Patient will call and follow up with agency to arrange home visit) Any new equipment or medical supplies ordered?: No  Functional Questionnaire: Do you need assistance with bathing/showering or dressing?: Yes Do you need assistance with meal preparation?: Yes Do you need assistance with eating?: No Do you have difficulty maintaining continence: No Do you need assistance with getting out of bed/getting out of a chair/moving?: Yes Do you have difficulty managing or taking your medications?: No  Folllow up appointments reviewed: PCP Follow-up appointment  confirmed?: [facility] Follow-up appointment confirmed?: Yes Date of Specialist follow-up appointment?: 10/26/22 Follow-Up Specialty Provider:: Dr. Burr Medico Do you need transportation to your follow-up appointment?: No Do you understand care options if your condition(s) worsen?: Yes-patient verbalized understanding  SDOH Interventions Today    Flowsheet Row Most Recent Value  SDOH Interventions   Food Insecurity Interventions Intervention Not Indicated  Transportation Interventions Intervention Not Indicated      TOC Interventions Today    Flowsheet Row Most Recent Value  TOC Interventions   TOC Interventions Discussed/Reviewed TOC Interventions Discussed, Post discharge activity limitations per provider       Interventions Today    Flowsheet Row Most Recent Value  Nutrition Interventions   Nutrition Discussed/Reviewed Nutrition Discussed  [pt drinking Ensures-does not have much of an appetite-has an aptp with nutritionist on 10/26/22]  Pharmacy Interventions   Pharmacy Dicussed/Reviewed Pharmacy Topics Discussed, Medications and their functions  Safety Interventions   Safety Discussed/Reviewed Safety Discussed, Fall Risk       [facility]et[facility] Bl[facility] Care Management Care Management Community Coordinator Direct Phone: [phone] Toll Free: [phone] Fax: [phone]

## 2022-10-24 NOTE — Progress Notes (Unsigned)
[place]   [phone] [phone]   Clinic Follow up Note   Patient Care Team: [name], MD as PCP - General (Family Medicine) [name], MD as Consulting Physician (General Surgery) Truitt Merle, MD as Consulting Physician (Hematology) Eppie Gibson, MD as Attending Physician (Radiation Oncology) Alla Feeling, NP as Nurse Practitioner (Nurse Practitioner)  Date of Service:  10/26/2022  CHIEF COMPLAINT: f/u of left breast cancer and Right    CURRENT THERAPY:  Zometa q3 months/   Paclitaxel+ Trastuzumab+ Pertuzumab q21d x 8 cycles /Trastuzumab+Pertuzumab q21 x4 cyclesstarting 10/19/2022    ASSESSMENT:  Martha [name] is a 62 y.o. female with   Metastatic breast cancer to bone and brain -Diagnosed in 06/2022 --PET scan from 08/03/2022 showed diffuse bone mets  -she underwent cervical laminectomy and fusion by Dr. Marcello Moores on 08/17/2022, biopsy confirmed metastatic breast cancer, ER/PR negative, HER2 positive. -due to cervical cord compression with myelopathy, she underwent a second cervical spine surgery on September 06, 2022, and completed inpatient rehabitation. -She has completed brain, cervical and lumbar spine radiation in Feb 2024 -She was recently hospitalized for altered mental status and confusion, probably secondary to medication -Plan to start systemic chemotherapy with weekly Taxol and trastuzumab/Perjeta today, I again reviewed potential side effects with patient and her husband, she agrees to proceed.  Malignant neoplasm of lower-inner quadrant of left breast in female, estrogen receptor positive ([place]) --She was diagnosed in 12/2017. She is s/p left breast lumpectomy and adjuvant radiation.  -She started anti-estrogen therapy with letrozole on 05/2018. Tolerating well with no issues -lost f/u after visit in 04/2020 until her recurrence in 07/2022   Cancer related pain -She is on low-dose oxycodone, she has developed a  significant pain in the right rib cage, probably related to her bone metastasis -She will see palliative care nurse practitioner Lexine Baton today PLAN:  -Lab reviewed, CBC normal, CMP still pending, if adequate, will proceed for cycle chemotherapy -Chemo and follow-up weekly for next 2-3 treatments    SUMMARY OF ONCOLOGIC HISTORY: Oncology History Overview Note  Cancer Staging Malignant neoplasm of lower-inner quadrant of left breast in female, estrogen receptor positive ([place]) Staging form: Breast, AJCC 8th Edition - Clinical stage from 01/16/2018: Stage IA (cT1c, cN0, cM0, G2, ER+, PR+, HER2-) - Signed by Truitt Merle, MD on 01/23/2018 - Pathologic: Stage IA (pT1c, pN1, cM0, G1, ER+, PR+, HER2-) - Signed by Eppie Gibson, MD on 04/09/2018     Malignant neoplasm of lower-inner quadrant of left breast in female, estrogen receptor positive ([place])  01/15/2018 Mammogram   Diagnositc Mammogram 01/15/18  IMPRESSION: 1. Suspicious 1.2 x 1.4 x 1.3 cm mixed echogenicity mass left breast 7 o'clock position retroareolar location at the site of palpable concern.. 2. Indeterminate Within the left breast 7:30 o'clock retroareolar location, adjacent to the palpable mass, is a 0.5 x 0.4 x 0.5 cm oval circumscribed hypoechoic mass. 3. Indeterminate calcifications within the lateral left breast. Location of these calcifications is not definitely confirmed on the true lateral view.    01/16/2018 Initial Biopsy   Diagnosis 01/16/18 1. Breast, left, needle core biopsy, 7:30 o'clock (ribbon clip) - FIBROCYSTIC CHANGES WITH SCLEROSING ADENOSIS AND CALCIFICATIONS. - FIBROADENOMATOID CHANGE. - NO MALIGNANCY IDENTIFIED. 2. Breast, left, needle core biopsy, 7 o'clock position (coil clip) - INVASIVE MAMMARY CARCINOMA, MSBR GRADE I/II. - SEE MICROSCOPIC DESCRIPTION Microscopic Comment  ADDENDUM: Immunohistochemistry for E-Cadherin is strongly positive in the tumor consistent with ductal carcinoma. (JDP:ah  01/17/18)  01/16/2018 Receptors her2   Estrogen Receptor: 100%, POSITIVE, STRONG STAINING INTENSITY Progesterone Receptor: 50%, POSITIVE, STRONG STAINING INTENSITY Proliferation Marker Ki67: 20% HER2 Negative   01/16/2018 Cancer Staging   Staging form: Breast, AJCC 8th Edition - Clinical stage from 01/16/2018: Stage IA (cT1c, cN0, cM0, G2, ER+, PR+, HER2-) - Signed by Truitt Merle, MD on 01/23/2018   01/22/2018 Initial Diagnosis   Malignant neoplasm of lower-inner quadrant of left breast in female, estrogen receptor positive ([place])   02/21/2018 Surgery    LEFT BREAST LUMPECTOMY WITH AXILLARY LYMPH NODE BIOPSY by Dr. Barry Dienes  02/21/18   02/21/2018 Pathology Results   Diagnosis 02/21/18 1. Breast, lumpectomy, Left - INVASIVE DUCTAL CARCINOMA, GRADE I, 1.6 CM. - DUCTAL CARCINOMA IN SITU, INTERMEDIATE NUCLEAR GRADE. - ANTERIOR AND MEDIAL RESECTION MARGINS ARE POSITIVE FOR CARCINOMA. - NEGATIVE FOR LYMPHOVASCULAR OR PERINEURAL INVASION. - BACKGROUND BREAST TISSUE WITH FIBROCYSTIC CHANGE, INCLUDING SCLEROSING ADENOSIS. - BIOPSY SITE CHANGES. - SEE ONCOLOGY TABLE. 2. Lymph node, sentinel, biopsy, Left Axillary #1 - METASTATIC BREAST CARCINOMA TO A LYMPH NODE, 1.0 CM IN GREATEST DIMENSION, WITH EXTRANODAL EXTENSION (1/1). 3. Lymph node, sentinel, biopsy, Left Axillary #2 - LYMPH NODE, NEGATIVE FOR CARCINOMA (0/1).    02/21/2018 Miscellaneous   Mammaprint 02/21/18 Low Risk with 10-year risk of recurrnce at 10% -No potential signifcant chemotherapy benefit   03/20/2018 Pathology Results   RE-EXCISION OF BREAST LUMPECTOMY by Dr. Barry Dienes  Diagnosis 03/20/18 1. Breast, excision, Left new anterior margin - FIBROCYSTIC CHANGES WITH ADENOSIS AND CALCIFICATIONS. - HEALING BIOPSY SITE. - THERE IS NO EVIDENCE OF MALIGNANCY. 2. Breast, excision, Left new medial margin - FIBROCYSTIC CHANGES WITH ADENOSIS AND CALCIFICATIONS. - HEALING BIOPSY SITE. - THERE IS NO EVIDENCE OF MALIGNANCY. Microscopic  Comment 1. -2. The surgical resection margin(s) of the specimen were inked and microscopically evaluated. (JBK:kh 03-22-18)   04/09/2018 Cancer Staging   Staging form: Breast, AJCC 8th Edition - Pathologic: Stage IA (pT1c, pN1, cM0, G1, ER+, PR+, HER2-) - Signed by Eppie Gibson, MD on 04/09/2018   04/22/2018 - 06/03/2018 Radiation Therapy   Radaiton with Dr. Isidore Moos 04/22/18-06/03/18   05/2018 -  Anti-estrogen oral therapy   Letrozole 2.'5mg'$  started 05/2018    Survivorship   Per Cira Rue, NP    05/04/2022 Imaging    IMPRESSION: Cervical spondylosis, as described.   Nonspecific straightening of the expected cervical lordosis.   07/24/2022 Imaging    IMPRESSION: 1. Extensive osseous metastatic disease with pathologic fracture at the base of dens and C2 right lateral mass. Extraosseous tumor at C1 and C2 likely impinging on the right C2 and C3 nerve roots. 2. Degenerative cord impingement at C4-5 to C6-7. Biforaminal impingement at C5-6 and C6-7.   08/03/2022 PET scan    IMPRESSION: 1. Large volume osseous metastasis. 2. Low right cervical and probable right axillary nodal metastasis. 3. Subtle heterogeneous activity throughout the liver with suggestion of small liver lesions (likely new compared to chest CT of 07/16/2020). Findings are overall moderately suspicious for hepatic metastasis. Pre and post contrast abdominal MRI (preferred) or CT could confirm. 4. Right-sided pleural thickening and trace pleural fluid. Right base airspace disease is favored to represent chronic atelectasis. 5. Aortic atherosclerosis (ICD10-I70.0) and emphysema (ICD10-J43.9).     08/11/2022 Genetic Testing   Negative genetic testing on the CancerNext-Expanded+RNAinsight panel.  The report date is August 11, 2022.  The CancerNext-Expanded gene panel offered by [facility] and includes sequencing and rearrangement analysis for the following 77 genes: AIP, ALK, APC*, ATM*, AXIN2,  BAP1, BARD1,  BLM, BMPR1A, BRCA1*, BRCA2*, BRIP1*, CDC73, CDH1*, CDK4, CDKN1B, CDKN2A, CHEK2*, CTNNA1, DICER1, FANCC, FH, FLCN, GALNT12, KIF1B, LZTR1, MAX, MEN1, MET, MLH1*, MSH2*, MSH3, MSH6*, MUTYH*, NBN, NF1*, NF2, NTHL1, PALB2*, PHOX2B, PMS2*, POT1, PRKAR1A, PTCH1, PTEN*, RAD51C*, RAD51D*, RB1, RECQL, RET, SDHA, SDHAF2, SDHB, SDHC, SDHD, SMAD4, SMARCA4, SMARCB1, SMARCE1, STK11, SUFU, TMEM127, TP53*, TSC1, TSC2, VHL and XRCC2 (sequencing and deletion/duplication); EGFR, EGLN1, HOXB13, KIT, MITF, PDGFRA, POLD1, and POLE (sequencing only); EPCAM and GREM1 (deletion/duplication only). DNA and RNA analyses performed for * genes.    Lobular carcinoma in situ (LCIS) of right breast  01/20/2020 Mammogram   Diagnostic Mammogram 01/20/20 IMPRESSION: 1.  Stable post lumpectomy changes of the left breast.   2. Suspicious microcalcifications over the right upper outer quadrant spanning 3.6 cm.   02/02/2020 Initial Biopsy   Diagnosis 02/02/20 Breast, right, needle core biopsy, upper outer quadrant, x clip - LOBULAR CARCINOMA IN SITU WITH PLEOMORPHIC FEATURES AND CALCIFICATIONS, INVOLVING ADENOSIS. SEE NOTE Diagnosis Note Immunohistochemical stain for E-cadherin is negative in the lesional cells, consistent with a lobular phenotype. Immunostains for p63, SMM 1 and calponin do not show evidence of invasive carcinoma.    02/04/2020 Initial Diagnosis   Lobular carcinoma in situ (LCIS) of right breast   03/18/2020 Surgery   RIGHT BREAST LUMPECTOMY WITH RADIOACTIVE SEED LOCALIZATION by Dr Alessandra Bevels    03/18/2020 Pathology Results   FINAL MICROSCOPIC DIAGNOSIS:   A. BREAST, RIGHT, LUMPECTOMY:  - Pleomorphic lobular carcinoma in situ with calcifications and  underlying complex sclerosing lesion, adenosis and fibroadenomatoid  change.  - Margins of resection are not involved (Closest margins: < 1 mm,  anterior, posterior, inferior and medial).  - Biopsy site.    COMMENT:   P63, Calponin and SMM-1 demonstrate the presence  of myoepithelium in the  select focus.    Metastatic breast cancer to bone and brain  05/04/2022 Imaging    IMPRESSION: Cervical spondylosis, as described.   Nonspecific straightening of the expected cervical lordosis.   07/24/2022 Imaging    IMPRESSION: 1. Extensive osseous metastatic disease with pathologic fracture at the base of dens and C2 right lateral mass. Extraosseous tumor at C1 and C2 likely impinging on the right C2 and C3 nerve roots. 2. Degenerative cord impingement at C4-5 to C6-7. Biforaminal impingement at C5-6 and C6-7.   08/02/2022 Initial Diagnosis   Cancer, metastatic to bone ([place])   08/03/2022 PET scan    IMPRESSION: 1. Large volume osseous metastasis. 2. Low right cervical and probable right axillary nodal metastasis. 3. Subtle heterogeneous activity throughout the liver with suggestion of small liver lesions (likely new compared to chest CT of 07/16/2020). Findings are overall moderately suspicious for hepatic metastasis. Pre and post contrast abdominal MRI (preferred) or CT could confirm. 4. Right-sided pleural thickening and trace pleural fluid. Right base airspace disease is favored to represent chronic atelectasis. 5. Aortic atherosclerosis (ICD10-I70.0) and emphysema (ICD10-J43.9).     10/19/2022 -  Chemotherapy   Patient is on Treatment Plan : BREAST Paclitaxel D1,8,15 + Trastuzumab D1 + Pertuzumab D1 q21d x 8 cycles / Trastuzumab D1 + Pertuzumab D1 q21d x 4 cycles        INTERVAL HISTORY:  Martha [name] is here for a follow up of  left breast cancer and Right  She was last seen by me on 10/02/2022 She presents to the clinic with her husband today.  She reports significant pain in the right rib cage, not controlled by oxycodone as needed.  Since  her hospital discharge, she has no confusion or hallucination.  Her pain is better when she lay flat, so she has not been able to do much activities at home.  All other systems were reviewed with  the patient and are negative.  MEDICAL HISTORY:  Past Medical History:  Diagnosis Date   Anxiety    Cancer ([place]) 2022   right breast LCIS   Cancer ([place]) 2019   left breast   Depression    Dysrhythmia    SVT, s/p ablation ~ 2012 in at [facility]   Ectopic pregnancy    Family history of breast cancer    Family history of melanoma    History of radiation therapy 04/22/18-06/03/18   Left Breast, left SCV, axilla 50 Gy in 25 fractions, Left breast boost 10 Gy in 5 fractions.    Hypothyroidism    Personal history of radiation therapy    PONV (postoperative nausea and vomiting)    Thyroid disease     SURGICAL HISTORY: Past Surgical History:  Procedure Laterality Date   APPENDECTOMY     BILATERAL SALPINGECTOMY     BREAST BIOPSY Right 2014   fibroadenoma   BREAST BIOPSY Left 01/16/2018   BREAST BIOPSY Right 02/02/2020   LCIS   BREAST BIOPSY Right 08/04/2020   x2 LCIS   BREAST BIOPSY Right 08/13/2020   x2   BREAST EXCISIONAL BIOPSY Left 1990   benign   BREAST EXCISIONAL BIOPSY Right 02/02/2020   LCIS   BREAST LUMPECTOMY Left 01/22/2018   BREAST LUMPECTOMY WITH AXILLARY LYMPH NODE BIOPSY Left 02/21/2018   Procedure: LEFT BREAST LUMPECTOMY WITH AXILLARY LYMPH NODE BIOPSY;  Surgeon: [name], MD;  Location: [place];  Service: General;  Laterality: Left;   BREAST LUMPECTOMY WITH RADIOACTIVE SEED LOCALIZATION Right 03/18/2020   Procedure: RIGHT BREAST LUMPECTOMY WITH RADIOACTIVE SEED LOCALIZATION;  Surgeon: [name], MD;  Location: [place];  Service: General;  Laterality: Right;   BREAST SURGERY Left 1993   cyst removed   ENDOMETRIAL ABLATION     EYE SURGERY     GLAUCOMA SURGERY Bilateral    IR IMAGING GUIDED PORT INSERTION  10/16/2022   POSTERIOR CERVICAL FUSION/FORAMINOTOMY N/A 08/17/2022   Procedure: Cervical One-Cervical Four POSTERIOR CERVICAL FUSION,  Cervical One LAMINECTOMY REDUCTION OF Cervical Two Fracture, Biopsy of Right Cerical Two Pars  Lesion;   Surgeon: Vallarie Mare, MD;  Location: [place];  Service: Neurosurgery;  Laterality: N/A;   POSTERIOR CERVICAL FUSION/FORAMINOTOMY N/A 09/06/2022   Procedure: Posterior Cervical Fusion, Foraminotomy , Cervical Five-Six, Cervical Six-Seven; Extension of  fusion Cervical Four- Thoracic One;  Surgeon: Vallarie Mare, MD;  Location: [place];  Service: Neurosurgery;  Laterality: N/A;   RADIOACTIVE SEED GUIDED EXCISIONAL BREAST BIOPSY Right 04/28/2021   Procedure: RADIOACTIVE SEED GUIDED EXCISIONAL RIGHT BREAST BIOPSY X2;  Surgeon: [name], MD;  Location: [place];  Service: General;  Laterality: Right;   RE-EXCISION OF BREAST LUMPECTOMY Left 03/20/2018   Procedure: RE-EXCISION OF BREAST LUMPECTOMY;  Surgeon: [name], MD;  Location: [place];  Service: General;  Laterality: Left;    I have reviewed the social history and family history with the patient and they are unchanged from previous note.  ALLERGIES:  is allergic to morphine and related.  MEDICATIONS:  Current Outpatient Medications  Medication Sig Dispense Refill   acetaminophen (TYLENOL) 500 MG tablet Take 2 tablets (1,000 mg total) by mouth every 8 (eight) hours. 30 tablet 0   apixaban (  ELIQUIS) 2.5 MG TABS tablet Take 1 tablet (2.5 mg total) by mouth 2 (two) times daily. 86 tablet 0   citalopram (CELEXA) 40 MG tablet Take 1 tablet (40 mg total) by mouth daily. 30 tablet 0   levothyroxine (SYNTHROID) 75 MCG tablet Take 1 tablet (75 mcg total) by mouth daily before breakfast. 30 tablet 0   lidocaine (XYLOCAINE) 2 % solution Patient: Mix 1part 2% viscous lidocaine, 1part H20. Swallow 44m of diluted mixture, 331m before meals and at bedtime, up to QID 100 mL 3   lidocaine-prilocaine (EMLA) cream Apply to affected area once (Patient taking differently: Apply 1 Application topically daily as needed (burning).) 30 g 3   LORazepam (ATIVAN) 0.5 MG tablet Take 1 tablet (0.5 mg total) by mouth every  6 (six) hours as needed for anxiety (agitation). 30 tablet 0   Mouthwashes (MOUTH RINSE) LIQD solution 15 mLs by Mouth Rinse route as needed (for oral care).  0   OVER THE COUNTER MEDICATION Take 1 tablet by mouth daily. Protandim Supplement     oxyCODONE (OXY IR/ROXICODONE) 5 MG immediate release tablet Take 0.5-1 tablets (2.5-5 mg total) by mouth every 4 (four) hours as needed for moderate pain or severe pain (dyspnea). 30 tablet 0   pregabalin (LYRICA) 25 MG capsule Take 1 capsule (25 mg total) by mouth daily. 30 capsule 0   senna-docusate (SENOKOT-S) 8.6-50 MG tablet Take 1 tablet by mouth 2 (two) times daily. 60 tablet 0   No current facility-administered medications for this visit.    PHYSICAL EXAMINATION: ECOG PERFORMANCE STATUS: 3 - Symptomatic, >50% confined to bed  Vitals:   10/26/22 0857  BP: 109/69  Pulse: (!) 106  Resp: 18  SpO2: 99%   Wt Readings from Last 3 Encounters:  10/12/22 88 lb 13.5 oz (40.3 kg)  09/26/22 88 lb 13.5 oz (40.3 kg)  09/04/22 97 lb (44 kg)     GENERAL:alert, no distress and comfortable SKIN: skin color, texture, turgor are normal, no rashes or significant lesions EYES: normal, Conjunctiva are pink and non-injected, sclera clear NECK: supple, thyroid normal size, non-tender, without nodularity LYMPH:  no palpable lymphadenopathy in the cervical, axillary  LUNGS: clear to auscultation and percussion with normal breathing effort HEART: regular rate & rhythm and no murmurs and no lower extremity edema ABDOMEN:abdomen soft, non-tender and normal bowel sounds Musculoskeletal:no cyanosis of digits and no clubbing  NEURO: alert & oriented x 3 with fluent speech, no focal motor/sensory deficits  LABORATORY DATA:  I have reviewed the data as listed    Latest Ref Rng & Units 10/26/2022    8:11 AM 10/19/2022    5:00 AM 10/18/2022    6:07 AM  CBC  WBC 4.0 - 10.5 K/uL 12.7  10.3  7.4   Hemoglobin 12.0 - 15.0 g/dL 13.2  11.8  11.6   Hematocrit 36.0 -  46.0 % 37.5  34.7  34.6   Platelets 150 - 400 K/uL 200  175  153         Latest Ref Rng & Units 10/26/2022    8:11 AM 10/20/2022    5:50 AM 10/19/2022    5:00 AM  CMP  Glucose 70 - 99 mg/dL 105  103  126   BUN 8 - 23 mg/dL '15  9  6   '$ Creatinine 0.44 - 1.00 mg/dL <0.30  <0.30  <0.30   Sodium 135 - 145 mmol/L 134  131  132   Potassium 3.5 - 5.1 mmol/L 3.7  4.1  3.2   Chloride 98 - 111 mmol/L 97  100  101   CO2 22 - 32 mmol/L '22  21  23   '$ Calcium 8.9 - 10.3 mg/dL 8.4  8.5  8.4   Total Protein 6.5 - 8.1 g/dL 5.8  5.5  5.7   Total Bilirubin 0.3 - 1.2 mg/dL 0.8  1.1  0.9   Alkaline Phos 38 - 126 U/L 1,766  1,349  1,006   AST 15 - 41 U/L 90  105  95   ALT 0 - 44 U/L 30  48  46       RADIOGRAPHIC STUDIES: I have personally reviewed the radiological images as listed and agreed with the findings in the report. No results found.    Orders Placed This Encounter  Procedures   CBC with Differential ([place] Only)    Standing Status:   Future    Standing Expiration Date:   11/09/2023   CMP ([place] only)    Standing Status:   Future    Standing Expiration Date:   11/09/2023   CBC with Differential ([place] Only)    Standing Status:   Future    Standing Expiration Date:   11/16/2023   CMP ([place] only)    Standing Status:   Future    Standing Expiration Date:   11/16/2023   CBC with Differential ([place] Only)    Standing Status:   Future    Standing Expiration Date:   11/23/2023   CMP ([place] [facility] only)    Standing Status:   Future    Standing Expiration Date:   11/23/2023   All questions were answered. The patient knows to call the clinic with any problems, questions or concerns. No barriers to learning was detected. The total time spent in the appointment was 30 minutes.     Truitt Merle, MD 10/26/2022   Felicity Coyer, CMA, am acting as scribe for Truitt Merle, MD.   I have reviewed the above documentation for accuracy and completeness, and I  agree with the above.

## 2022-10-25 ENCOUNTER — Ambulatory Visit: Payer: BC Managed Care – PPO

## 2022-10-25 MED FILL — Dexamethasone Sodium Phosphate Inj 100 MG/10ML: INTRAMUSCULAR | Qty: 1 | Status: AC

## 2022-10-25 NOTE — Assessment & Plan Note (Addendum)
-  Diagnosed in 06/2022 --PET scan from 08/03/2022 showed diffuse bone mets  -she underwent cervical laminectomy and fusion by Dr. Marcello Moores on 08/17/2022, biopsy confirmed metastatic breast cancer, ER/PR negative, HER2 positive. -due to cervical cord compression with myelopathy, she underwent a second cervical spine surgery on September 06, 2022, and completed inpatient rehabitation. -She has completed brain, cervical and lumbar spine radiation in Feb 2024 -She was recently hospitalized for altered mental status and confusion, probably secondary to medication -Plan to start systemic chemotherapy with weekly Taxol and trastuzumab/Perjeta today

## 2022-10-25 NOTE — Assessment & Plan Note (Signed)
--  She was diagnosed in 12/2017. She is s/p left breast lumpectomy and adjuvant radiation.  -She started anti-estrogen therapy with letrozole on 05/2018. Tolerating well with no issues -lost f/u after visit in 04/2020 until her recurrence in 07/2022

## 2022-10-26 ENCOUNTER — Other Ambulatory Visit: Payer: Self-pay

## 2022-10-26 ENCOUNTER — Inpatient Hospital Stay: Payer: BC Managed Care – PPO

## 2022-10-26 ENCOUNTER — Inpatient Hospital Stay (HOSPITAL_BASED_OUTPATIENT_CLINIC_OR_DEPARTMENT_OTHER): Payer: BC Managed Care – PPO | Admitting: Hematology

## 2022-10-26 ENCOUNTER — Encounter: Payer: Self-pay | Admitting: Nurse Practitioner

## 2022-10-26 ENCOUNTER — Ambulatory Visit: Payer: BC Managed Care – PPO | Admitting: Nurse Practitioner

## 2022-10-26 ENCOUNTER — Ambulatory Visit (HOSPITAL_COMMUNITY)
Admission: RE | Admit: 2022-10-26 | Discharge: 2022-10-26 | Disposition: A | Discharge status: Home / Self Care | Payer: BC Managed Care – PPO | Source: Ambulatory Visit | Attending: Hematology | Admitting: Hematology

## 2022-10-26 ENCOUNTER — Encounter: Payer: Self-pay | Admitting: Hematology

## 2022-10-26 ENCOUNTER — Other Ambulatory Visit: Payer: Self-pay | Admitting: Hematology

## 2022-10-26 ENCOUNTER — Inpatient Hospital Stay (HOSPITAL_BASED_OUTPATIENT_CLINIC_OR_DEPARTMENT_OTHER): Payer: BC Managed Care – PPO | Admitting: Nurse Practitioner

## 2022-10-26 ENCOUNTER — Ambulatory Visit: Payer: BC Managed Care – PPO

## 2022-10-26 ENCOUNTER — Other Ambulatory Visit: Payer: BC Managed Care – PPO

## 2022-10-26 ENCOUNTER — Inpatient Hospital Stay: Payer: BC Managed Care – PPO | Admitting: Nutrition

## 2022-10-26 VITALS — BP 109/69 | HR 106 | Resp 18

## 2022-10-26 VITALS — BP 131/85 | HR 102 | Temp 98.4°F | Resp 18

## 2022-10-26 DIAGNOSIS — C50312 Malignant neoplasm of lower-inner quadrant of left female breast: Secondary | ICD-10-CM | POA: Insufficient documentation

## 2022-10-26 DIAGNOSIS — C7951 Secondary malignant neoplasm of bone: Secondary | ICD-10-CM

## 2022-10-26 DIAGNOSIS — C50911 Malignant neoplasm of unspecified site of right female breast: Secondary | ICD-10-CM | POA: Diagnosis not present

## 2022-10-26 DIAGNOSIS — G893 Neoplasm related pain (acute) (chronic): Secondary | ICD-10-CM

## 2022-10-26 DIAGNOSIS — Z515 Encounter for palliative care: Secondary | ICD-10-CM

## 2022-10-26 DIAGNOSIS — R530 Neoplastic (malignant) related fatigue: Secondary | ICD-10-CM

## 2022-10-26 DIAGNOSIS — Z7189 Other specified counseling: Secondary | ICD-10-CM

## 2022-10-26 DIAGNOSIS — Z95828 Presence of other vascular implants and grafts: Secondary | ICD-10-CM

## 2022-10-26 DIAGNOSIS — R079 Chest pain, unspecified: Secondary | ICD-10-CM | POA: Diagnosis not present

## 2022-10-26 DIAGNOSIS — C7931 Secondary malignant neoplasm of brain: Secondary | ICD-10-CM | POA: Diagnosis not present

## 2022-10-26 DIAGNOSIS — Z170 Estrogen receptor positive status [ER+]: Secondary | ICD-10-CM

## 2022-10-26 DIAGNOSIS — J90 Pleural effusion, not elsewhere classified: Secondary | ICD-10-CM | POA: Diagnosis not present

## 2022-10-26 DIAGNOSIS — Z171 Estrogen receptor negative status [ER-]: Secondary | ICD-10-CM | POA: Insufficient documentation

## 2022-10-26 DIAGNOSIS — Z923 Personal history of irradiation: Secondary | ICD-10-CM | POA: Diagnosis not present

## 2022-10-26 DIAGNOSIS — R634 Abnormal weight loss: Secondary | ICD-10-CM

## 2022-10-26 DIAGNOSIS — Z5112 Encounter for antineoplastic immunotherapy: Secondary | ICD-10-CM | POA: Insufficient documentation

## 2022-10-26 DIAGNOSIS — Z79899 Other long term (current) drug therapy: Secondary | ICD-10-CM | POA: Insufficient documentation

## 2022-10-26 DIAGNOSIS — R630 Anorexia: Secondary | ICD-10-CM

## 2022-10-26 LAB — CMP (CANCER CENTER ONLY)
ALT: 30 U/L (ref 0–44)
AST: 90 U/L — ABNORMAL HIGH (ref 15–41)
Albumin: 3.7 g/dL (ref 3.5–5.0)
Alkaline Phosphatase: 1766 U/L — ABNORMAL HIGH (ref 38–126)
Anion gap: 15 (ref 5–15)
BUN: 15 mg/dL (ref 8–23)
CO2: 22 mmol/L (ref 22–32)
Calcium: 8.4 mg/dL — ABNORMAL LOW (ref 8.9–10.3)
Chloride: 97 mmol/L — ABNORMAL LOW (ref 98–111)
Creatinine: <0.3 mg/dL — ABNORMAL LOW (ref 0.44–1.00)
Glucose, Bld: 105 mg/dL — ABNORMAL HIGH (ref 70–99)
Potassium: 3.7 mmol/L (ref 3.5–5.1)
Sodium: 134 mmol/L — ABNORMAL LOW (ref 135–145)
Total Bilirubin: 0.8 mg/dL (ref 0.3–1.2)
Total Protein: 5.8 g/dL — ABNORMAL LOW (ref 6.5–8.1)

## 2022-10-26 LAB — CBC WITH DIFFERENTIAL (CANCER CENTER ONLY)
Abs Immature Granulocytes: 0.69 10*3/uL — ABNORMAL HIGH (ref 0.00–0.07)
Basophils Absolute: 0.1 10*3/uL (ref 0.0–0.1)
Basophils Relative: 1 %
Eosinophils Absolute: 0.1 10*3/uL (ref 0.0–0.5)
Eosinophils Relative: 1 %
HCT: 37.5 % (ref 36.0–46.0)
Hemoglobin: 13.2 g/dL (ref 12.0–15.0)
Immature Granulocytes: 5 %
Lymphocytes Relative: 6 %
Lymphs Abs: 0.8 10*3/uL (ref 0.7–4.0)
MCH: 32 pg (ref 26.0–34.0)
MCHC: 35.2 g/dL (ref 30.0–36.0)
MCV: 91 fL (ref 80.0–100.0)
Monocytes Absolute: 0.7 10*3/uL (ref 0.1–1.0)
Monocytes Relative: 6 %
Neutro Abs: 10.3 10*3/uL — ABNORMAL HIGH (ref 1.7–7.7)
Neutrophils Relative %: 81 %
Platelet Count: 200 10*3/uL (ref 150–400)
RBC: 4.12 MIL/uL (ref 3.87–5.11)
RDW: 15.7 % — ABNORMAL HIGH (ref 11.5–15.5)
WBC Count: 12.7 10*3/uL — ABNORMAL HIGH (ref 4.0–10.5)
nRBC: 0.2 % (ref 0.0–0.2)

## 2022-10-26 MED ORDER — ONDANSETRON HCL 8 MG PO TABS: 8.0000 mg | ORAL_TABLET | Freq: Three times a day (TID) | ORAL | 1 refills | Status: DC | PRN

## 2022-10-26 MED ORDER — SODIUM CHLORIDE 0.9% FLUSH
10.0000 mL | INTRAVENOUS | Status: AC | PRN
  Administered 2022-10-26: 10 mL

## 2022-10-26 MED ORDER — SODIUM CHLORIDE 0.9% FLUSH
10.0000 mL | INTRAVENOUS | Status: DC | PRN
  Administered 2022-10-26: 10 mL

## 2022-10-26 MED ORDER — DIPHENHYDRAMINE HCL 50 MG/ML IJ SOLN
50.0000 mg | Freq: Once | INTRAMUSCULAR | Status: AC
  Administered 2022-10-26: 50 mg via INTRAVENOUS
  Filled 2022-10-26: qty 1

## 2022-10-26 MED ORDER — SODIUM CHLORIDE 0.9 % IV SOLN
80.0000 mg/m2 | Freq: Once | INTRAVENOUS | Status: AC
  Administered 2022-10-26: 114 mg via INTRAVENOUS
  Filled 2022-10-26: qty 19

## 2022-10-26 MED ORDER — SODIUM CHLORIDE 0.9 % IV SOLN
840.0000 mg | Freq: Once | INTRAVENOUS | Status: AC
  Administered 2022-10-26: 840 mg via INTRAVENOUS
  Filled 2022-10-26: qty 28

## 2022-10-26 MED ORDER — TRASTUZUMAB-DKST CHEMO 150 MG IV SOLR
8.0000 mg/kg | Freq: Once | INTRAVENOUS | Status: AC
  Administered 2022-10-26: 357 mg via INTRAVENOUS
  Filled 2022-10-26: qty 17

## 2022-10-26 MED ORDER — SODIUM CHLORIDE 0.9 % IV SOLN: Freq: Once | INTRAVENOUS | Status: AC

## 2022-10-26 MED ORDER — SODIUM CHLORIDE 0.9 % IV SOLN
10.0000 mg | Freq: Once | INTRAVENOUS | Status: AC
  Administered 2022-10-26: 10 mg via INTRAVENOUS
  Filled 2022-10-26: qty 10

## 2022-10-26 MED ORDER — HEPARIN SOD (PORK) LOCK FLUSH 100 UNIT/ML IV SOLN
500.0000 [IU] | Freq: Once | INTRAVENOUS | Status: AC | PRN
  Administered 2022-10-26: 500 [IU]

## 2022-10-26 MED ORDER — FAMOTIDINE IN NACL 20-0.9 MG/50ML-% IV SOLN
20.0000 mg | Freq: Once | INTRAVENOUS | Status: AC
  Administered 2022-10-26: 20 mg via INTRAVENOUS
  Filled 2022-10-26: qty 50

## 2022-10-26 MED ORDER — ACETAMINOPHEN 325 MG PO TABS
650.0000 mg | ORAL_TABLET | Freq: Once | ORAL | Status: AC
  Administered 2022-10-26: 650 mg via ORAL
  Filled 2022-10-26: qty 2

## 2022-10-26 NOTE — Progress Notes (Signed)
[facility]  Telephone:([phone]) [phone] [phone]) [phone]   Name: Martha [name] Date: 10/26/2022 MRN: [id number]  DOB: [birth year]  Patient Care Team: [name], MD as PCP - General (Family Medicine) [name], MD as Consulting Physician (General Surgery) Truitt Merle, MD as Consulting Physician (Hematology) Eppie Gibson, MD as Attending Physician (Radiation Oncology) Alla Feeling, NP as Nurse Practitioner (Nurse Practitioner)    REASON FOR CONSULTATION: Martha [name] is a 62 y.o. female with oncologic medical history including ER+ breast cancer (01/2018), right breast cancer (04/2021), now with metastatic disease progression involving brain and liver, pathological fractures with osseous mets s/p brain radiation. .  Palliative ask to see for symptom management and goals of care.    SOCIAL HISTORY:     reports that she has been smoking cigarettes. She has a 20.00 pack-year smoking history. She has never used smokeless tobacco. She reports that she does not currently use alcohol after a past usage of about 28.0 standard drinks of alcohol per week. She reports that she does not use drugs.  ADVANCE DIRECTIVES:  DNR and advanced directives on file  CODE STATUS: DNR  PAST MEDICAL HISTORY: Past Medical History:  Diagnosis Date   Anxiety    Cancer ([place]) 2022   right breast LCIS   Cancer ([place]) 2019   left breast   Depression    Dysrhythmia    SVT, s/p ablation ~ 2012 in at [facility]   Ectopic pregnancy    Family history of breast cancer    Family history of melanoma    History of radiation therapy 04/22/18-06/03/18   Left Breast, left SCV, axilla 50 Gy in 25 fractions, Left breast boost 10 Gy in 5 fractions.    Hypothyroidism    Personal history of radiation therapy    PONV (postoperative nausea and vomiting)    Thyroid disease     PAST SURGICAL HISTORY:  Past Surgical History:  Procedure Laterality  Date   APPENDECTOMY     BILATERAL SALPINGECTOMY     BREAST BIOPSY Right 2014   fibroadenoma   BREAST BIOPSY Left 01/16/2018   BREAST BIOPSY Right 02/02/2020   LCIS   BREAST BIOPSY Right 08/04/2020   x2 LCIS   BREAST BIOPSY Right 08/13/2020   x2   BREAST EXCISIONAL BIOPSY Left 1990   benign   BREAST EXCISIONAL BIOPSY Right 02/02/2020   LCIS   BREAST LUMPECTOMY Left 01/22/2018   BREAST LUMPECTOMY WITH AXILLARY LYMPH NODE BIOPSY Left 02/21/2018   Procedure: LEFT BREAST LUMPECTOMY WITH AXILLARY LYMPH NODE BIOPSY;  Surgeon: [name], MD;  Location: [facility];  Service: General;  Laterality: Left;   BREAST LUMPECTOMY WITH RADIOACTIVE SEED LOCALIZATION Right 03/18/2020   Procedure: RIGHT BREAST LUMPECTOMY WITH RADIOACTIVE SEED LOCALIZATION;  Surgeon: [name], MD;  Location: [place];  Service: General;  Laterality: Right;   BREAST SURGERY Left 1993   cyst removed   ENDOMETRIAL ABLATION     EYE SURGERY     GLAUCOMA SURGERY Bilateral    IR IMAGING GUIDED PORT INSERTION  10/16/2022   POSTERIOR CERVICAL FUSION/FORAMINOTOMY N/A 08/17/2022   Procedure: Cervical One-Cervical Four POSTERIOR CERVICAL FUSION,  Cervical One LAMINECTOMY REDUCTION OF Cervical Two Fracture, Biopsy of Right Cerical Two Pars  Lesion;  Surgeon: Vallarie Mare, MD;  Location: [place];  Service: Neurosurgery;  Laterality: N/A;   POSTERIOR CERVICAL FUSION/FORAMINOTOMY N/A 09/06/2022   Procedure: Posterior Cervical Fusion, Foraminotomy , Cervical Five-Six, Cervical  Six-Seven; Extension of  fusion Cervical Four- Thoracic One;  Surgeon: Vallarie Mare, MD;  Location: [place];  Service: Neurosurgery;  Laterality: N/A;   RADIOACTIVE SEED GUIDED EXCISIONAL BREAST BIOPSY Right 04/28/2021   Procedure: RADIOACTIVE SEED GUIDED EXCISIONAL RIGHT BREAST BIOPSY X2;  Surgeon: [name], MD;  Location: [place];  Service: General;  Laterality: Right;   RE-EXCISION OF BREAST LUMPECTOMY Left 03/20/2018   Procedure:  RE-EXCISION OF BREAST LUMPECTOMY;  Surgeon: [name], MD;  Location: [place];  Service: General;  Laterality: Left;    HEMATOLOGY/ONCOLOGY HISTORY:  Oncology History Overview Note  Cancer Staging Malignant neoplasm of lower-inner quadrant of left breast in female, estrogen receptor positive ([place]) Staging form: Breast, AJCC 8th Edition - Clinical stage from 01/16/2018: Stage IA (cT1c, cN0, cM0, G2, ER+, PR+, HER2-) - Signed by Truitt Merle, MD on 01/23/2018 - Pathologic: Stage IA (pT1c, pN1, cM0, G1, ER+, PR+, HER2-) - Signed by Eppie Gibson, MD on 04/09/2018     Malignant neoplasm of lower-inner quadrant of left breast in female, estrogen receptor positive ([place])  01/15/2018 Mammogram   Diagnositc Mammogram 01/15/18  IMPRESSION: 1. Suspicious 1.2 x 1.4 x 1.3 cm mixed echogenicity mass left breast 7 o'clock position retroareolar location at the site of palpable concern.. 2. Indeterminate Within the left breast 7:30 o'clock retroareolar location, adjacent to the palpable mass, is a 0.5 x 0.4 x 0.5 cm oval circumscribed hypoechoic mass. 3. Indeterminate calcifications within the lateral left breast. Location of these calcifications is not definitely confirmed on the true lateral view.    01/16/2018 Initial Biopsy   Diagnosis 01/16/18 1. Breast, left, needle core biopsy, 7:30 o'clock (ribbon clip) - FIBROCYSTIC CHANGES WITH SCLEROSING ADENOSIS AND CALCIFICATIONS. - FIBROADENOMATOID CHANGE. - NO MALIGNANCY IDENTIFIED. 2. Breast, left, needle core biopsy, 7 o'clock position (coil clip) - INVASIVE MAMMARY CARCINOMA, MSBR GRADE I/II. - SEE MICROSCOPIC DESCRIPTION Microscopic Comment  ADDENDUM: Immunohistochemistry for E-Cadherin is strongly positive in the tumor consistent with ductal carcinoma. (JDP:ah 01/17/18)   01/16/2018 Receptors her2   Estrogen Receptor: 100%, POSITIVE, STRONG STAINING INTENSITY Progesterone Receptor: 50%, POSITIVE, STRONG STAINING  INTENSITY Proliferation Marker Ki67: 20% HER2 Negative   01/16/2018 Cancer Staging   Staging form: Breast, AJCC 8th Edition - Clinical stage from 01/16/2018: Stage IA (cT1c, cN0, cM0, G2, ER+, PR+, HER2-) - Signed by Truitt Merle, MD on 01/23/2018   01/22/2018 Initial Diagnosis   Malignant neoplasm of lower-inner quadrant of left breast in female, estrogen receptor positive ([place])   02/21/2018 Surgery    LEFT BREAST LUMPECTOMY WITH AXILLARY LYMPH NODE BIOPSY by Dr. Barry Dienes  02/21/18   02/21/2018 Pathology Results   Diagnosis 02/21/18 1. Breast, lumpectomy, Left - INVASIVE DUCTAL CARCINOMA, GRADE I, 1.6 CM. - DUCTAL CARCINOMA IN SITU, INTERMEDIATE NUCLEAR GRADE. - ANTERIOR AND MEDIAL RESECTION MARGINS ARE POSITIVE FOR CARCINOMA. - NEGATIVE FOR LYMPHOVASCULAR OR PERINEURAL INVASION. - BACKGROUND BREAST TISSUE WITH FIBROCYSTIC CHANGE, INCLUDING SCLEROSING ADENOSIS. - BIOPSY SITE CHANGES. - SEE ONCOLOGY TABLE. 2. Lymph node, sentinel, biopsy, Left Axillary #1 - METASTATIC BREAST CARCINOMA TO A LYMPH NODE, 1.0 CM IN GREATEST DIMENSION, WITH EXTRANODAL EXTENSION (1/1). 3. Lymph node, sentinel, biopsy, Left Axillary #2 - LYMPH NODE, NEGATIVE FOR CARCINOMA (0/1).    02/21/2018 Miscellaneous   Mammaprint 02/21/18 Low Risk with 10-year risk of recurrnce at 10% -No potential signifcant chemotherapy benefit   03/20/2018 Pathology Results   RE-EXCISION OF BREAST LUMPECTOMY by Dr. Barry Dienes  Diagnosis 03/20/18 1. Breast, excision, Left new anterior  margin - FIBROCYSTIC CHANGES WITH ADENOSIS AND CALCIFICATIONS. - HEALING BIOPSY SITE. - THERE IS NO EVIDENCE OF MALIGNANCY. 2. Breast, excision, Left new medial margin - FIBROCYSTIC CHANGES WITH ADENOSIS AND CALCIFICATIONS. - HEALING BIOPSY SITE. - THERE IS NO EVIDENCE OF MALIGNANCY. Microscopic Comment 1. -2. The surgical resection margin(s) of the specimen were inked and microscopically evaluated. (JBK:kh 03-22-18)   04/09/2018 Cancer Staging   Staging  form: Breast, AJCC 8th Edition - Pathologic: Stage IA (pT1c, pN1, cM0, G1, ER+, PR+, HER2-) - Signed by Eppie Gibson, MD on 04/09/2018   04/22/2018 - 06/03/2018 Radiation Therapy   Radaiton with Dr. Isidore Moos 04/22/18-06/03/18   05/2018 -  Anti-estrogen oral therapy   Letrozole 2.'5mg'$  started 05/2018    Survivorship   Per Cira Rue, NP    05/04/2022 Imaging    IMPRESSION: Cervical spondylosis, as described.   Nonspecific straightening of the expected cervical lordosis.   07/24/2022 Imaging    IMPRESSION: 1. Extensive osseous metastatic disease with pathologic fracture at the base of dens and C2 right lateral mass. Extraosseous tumor at C1 and C2 likely impinging on the right C2 and C3 nerve roots. 2. Degenerative cord impingement at C4-5 to C6-7. Biforaminal impingement at C5-6 and C6-7.   08/03/2022 PET scan    IMPRESSION: 1. Large volume osseous metastasis. 2. Low right cervical and probable right axillary nodal metastasis. 3. Subtle heterogeneous activity throughout the liver with suggestion of small liver lesions (likely new compared to chest CT of 07/16/2020). Findings are overall moderately suspicious for hepatic metastasis. Pre and post contrast abdominal MRI (preferred) or CT could confirm. 4. Right-sided pleural thickening and trace pleural fluid. Right base airspace disease is favored to represent chronic atelectasis. 5. Aortic atherosclerosis (ICD10-I70.0) and emphysema (ICD10-J43.9).     08/11/2022 Genetic Testing   Negative genetic testing on the CancerNext-Expanded+RNAinsight panel.  The report date is August 11, 2022.  The CancerNext-Expanded gene panel offered by [facility] and includes sequencing and rearrangement analysis for the following 77 genes: AIP, ALK, APC*, ATM*, AXIN2, BAP1, BARD1, BLM, BMPR1A, BRCA1*, BRCA2*, BRIP1*, CDC73, CDH1*, CDK4, CDKN1B, CDKN2A, CHEK2*, CTNNA1, DICER1, FANCC, FH, FLCN, GALNT12, KIF1B, LZTR1, MAX, MEN1, MET, MLH1*, MSH2*,  MSH3, MSH6*, MUTYH*, NBN, NF1*, NF2, NTHL1, PALB2*, PHOX2B, PMS2*, POT1, PRKAR1A, PTCH1, PTEN*, RAD51C*, RAD51D*, RB1, RECQL, RET, SDHA, SDHAF2, SDHB, SDHC, SDHD, SMAD4, SMARCA4, SMARCB1, SMARCE1, STK11, SUFU, TMEM127, TP53*, TSC1, TSC2, VHL and XRCC2 (sequencing and deletion/duplication); EGFR, EGLN1, HOXB13, KIT, MITF, PDGFRA, POLD1, and POLE (sequencing only); EPCAM and GREM1 (deletion/duplication only). DNA and RNA analyses performed for * genes.    Lobular carcinoma in situ (LCIS) of right breast  01/20/2020 Mammogram   Diagnostic Mammogram 01/20/20 IMPRESSION: 1.  Stable post lumpectomy changes of the left breast.   2. Suspicious microcalcifications over the right upper outer quadrant spanning 3.6 cm.   02/02/2020 Initial Biopsy   Diagnosis 02/02/20 Breast, right, needle core biopsy, upper outer quadrant, x clip - LOBULAR CARCINOMA IN SITU WITH PLEOMORPHIC FEATURES AND CALCIFICATIONS, INVOLVING ADENOSIS. SEE NOTE Diagnosis Note Immunohistochemical stain for E-cadherin is negative in the lesional cells, consistent with a lobular phenotype. Immunostains for p63, SMM 1 and calponin do not show evidence of invasive carcinoma.    02/04/2020 Initial Diagnosis   Lobular carcinoma in situ (LCIS) of right breast   03/18/2020 Surgery   RIGHT BREAST LUMPECTOMY WITH RADIOACTIVE SEED LOCALIZATION by Dr Alessandra Bevels    03/18/2020 Pathology Results   FINAL MICROSCOPIC DIAGNOSIS:   A. BREAST, RIGHT, LUMPECTOMY:  - Pleomorphic  lobular carcinoma in situ with calcifications and  underlying complex sclerosing lesion, adenosis and fibroadenomatoid  change.  - Margins of resection are not involved (Closest margins: < 1 mm,  anterior, posterior, inferior and medial).  - Biopsy site.    COMMENT:   P63, Calponin and SMM-1 demonstrate the presence of myoepithelium in the  select focus.    Metastatic breast cancer to bone and brain  05/04/2022 Imaging    IMPRESSION: Cervical spondylosis, as described.    Nonspecific straightening of the expected cervical lordosis.   07/24/2022 Imaging    IMPRESSION: 1. Extensive osseous metastatic disease with pathologic fracture at the base of dens and C2 right lateral mass. Extraosseous tumor at C1 and C2 likely impinging on the right C2 and C3 nerve roots. 2. Degenerative cord impingement at C4-5 to C6-7. Biforaminal impingement at C5-6 and C6-7.   08/02/2022 Initial Diagnosis   Cancer, metastatic to bone ([place])   08/03/2022 PET scan    IMPRESSION: 1. Large volume osseous metastasis. 2. Low right cervical and probable right axillary nodal metastasis. 3. Subtle heterogeneous activity throughout the liver with suggestion of small liver lesions (likely new compared to chest CT of 07/16/2020). Findings are overall moderately suspicious for hepatic metastasis. Pre and post contrast abdominal MRI (preferred) or CT could confirm. 4. Right-sided pleural thickening and trace pleural fluid. Right base airspace disease is favored to represent chronic atelectasis. 5. Aortic atherosclerosis (ICD10-I70.0) and emphysema (ICD10-J43.9).     10/19/2022 -  Chemotherapy   Patient is on Treatment Plan : BREAST Paclitaxel D1,8,15 + Trastuzumab D1 + Pertuzumab D1 q21d x 8 cycles / Trastuzumab D1 + Pertuzumab D1 q21d x 4 cycles       ALLERGIES:  is allergic to morphine and related.  MEDICATIONS:  Current Outpatient Medications  Medication Sig Dispense Refill   acetaminophen (TYLENOL) 500 MG tablet Take 2 tablets (1,000 mg total) by mouth every 8 (eight) hours. 30 tablet 0   apixaban (ELIQUIS) 2.5 MG TABS tablet Take 1 tablet (2.5 mg total) by mouth 2 (two) times daily. 86 tablet 0   citalopram (CELEXA) 40 MG tablet Take 1 tablet (40 mg total) by mouth daily. 30 tablet 0   levothyroxine (SYNTHROID) 75 MCG tablet Take 1 tablet (75 mcg total) by mouth daily before breakfast. 30 tablet 0   lidocaine (XYLOCAINE) 2 % solution Patient: Mix 1part 2% viscous lidocaine,  1part H20. Swallow 106m of diluted mixture, 315m before meals and at bedtime, up to QID 100 mL 3   lidocaine-prilocaine (EMLA) cream Apply to affected area once (Patient taking differently: Apply 1 Application topically daily as needed (burning).) 30 g 3   LORazepam (ATIVAN) 0.5 MG tablet Take 1 tablet (0.5 mg total) by mouth every 6 (six) hours as needed for anxiety (agitation). 30 tablet 0   Mouthwashes (MOUTH RINSE) LIQD solution 15 mLs by Mouth Rinse route as needed (for oral care).  0   OVER THE COUNTER MEDICATION Take 1 tablet by mouth daily. Protandim Supplement     oxyCODONE (OXY IR/ROXICODONE) 5 MG immediate release tablet Take 0.5-1 tablets (2.5-5 mg total) by mouth every 4 (four) hours as needed for moderate pain or severe pain (dyspnea). 30 tablet 0   pregabalin (LYRICA) 25 MG capsule Take 1 capsule (25 mg total) by mouth daily. 30 capsule 0   senna-docusate (SENOKOT-S) 8.6-50 MG tablet Take 1 tablet by mouth 2 (two) times daily. 60 tablet 0   No current facility-administered medications for this visit.  Facility-Administered Medications Ordered in Other Visits  Medication Dose Route Frequency Provider Last Rate Last Admin   sodium chloride flush (NS) 0.9 % injection 10 mL  10 mL Intracatheter PRN Truitt Merle, MD        VITAL SIGNS: There were no vitals taken for this visit. There were no vitals filed for this visit.  Estimated body mass index is 16.25 kg/m as calculated from the following:   Height as of 10/12/22: '5\' 2"'$  (1.575 m).   Weight as of 10/12/22: 88 lb 13.5 oz (40.3 kg).  LABS: CBC:    Component Value Date/Time   WBC 10.3 10/19/2022 0500   HGB 11.8 (L) 10/19/2022 0500   HGB 13.3 10/02/2022 1117   HCT 34.7 (L) 10/19/2022 0500   PLT 175 10/19/2022 0500   PLT 189 10/02/2022 1117   MCV 91.3 10/19/2022 0500   NEUTROABS 7.7 10/19/2022 0500   LYMPHSABS 0.5 (L) 10/19/2022 0500   MONOABS 1.0 10/19/2022 0500   EOSABS 0.1 10/19/2022 0500   BASOSABS 0.1 10/19/2022 0500    Comprehensive Metabolic Panel:    Component Value Date/Time   NA 131 (L) 10/20/2022 0550   K 4.1 10/20/2022 0550   CL 100 10/20/2022 0550   CO2 21 (L) 10/20/2022 0550   BUN 9 10/20/2022 0550   CREATININE <0.30 (L) 10/20/2022 0550   CREATININE 0.45 10/02/2022 1117   GLUCOSE 103 (H) 10/20/2022 0550   CALCIUM 8.5 (L) 10/20/2022 0550   AST 105 (H) 10/20/2022 0550   AST 71 (H) 10/02/2022 1117   ALT 48 (H) 10/20/2022 0550   ALT 41 10/02/2022 1117   ALKPHOS 1,349 (H) 10/20/2022 0550   BILITOT 1.1 10/20/2022 0550   BILITOT 0.5 10/02/2022 1117   PROT 5.5 (L) 10/20/2022 0550   ALBUMIN 2.9 (L) 10/20/2022 0550    RADIOGRAPHIC STUDIES: MR BRAIN WO CONTRAST  Result Date: 10/12/2022 CLINICAL DATA:  Mental status change, unknown cause EXAM: MRI HEAD WITHOUT CONTRAST TECHNIQUE: Multiplanar, multiecho pulse sequences of the brain and surrounding structures were obtained without intravenous contrast. COMPARISON:  MRI head 09/13/2022. FINDINGS: Noncontrast and motion limited study.  Within these limitations: Brain: The lesion at the left CP angle is grossly similar. Additional enhancing lesions seen on the prior cannot be accurately assessed without contrast. Consider postcontrast imaging. No significant new mass effect. No obvious evidence of acute infarct, acute hemorrhage, midline shift, hydrocephalus or sizeable extra-axial fluid collection. Vascular: Major arterial flow voids are maintained at the skull base. Skull and upper cervical spine: Extensive osseous metastatic disease. Sinuses/Orbits: Clear sinuses.  No acute findings. Other: No mastoid effusions. IMPRESSION: 1. Noncontrast and motion limited study. The lesion at the left CP angle is grossly similar. Additional enhancing lesions seen on the prior cannot be accurately assessed without contrast. Consider postcontrast imaging. 2. No significant new mass effect or other obvious acute abnormality. 3. Osseous metastatic disease. Electronically  Signed   By: Margaretha Sheffield M.D.   On: 10/12/2022 12:35   CT Head Wo Contrast  Result Date: 10/12/2022 CLINICAL DATA:  Mental status change with unknown cause EXAM: CT HEAD WITHOUT CONTRAST TECHNIQUE: Contiguous axial images were obtained from the base of the skull through the vertex without intravenous contrast. RADIATION DOSE REDUCTION: This exam was performed according to the departmental dose-optimization program which includes automated exposure control, adjustment of the mA and/or kV according to patient size and/or use of iterative reconstruction technique. COMPARISON:  Brain MRI 09/13/2022 FINDINGS: Brain: No evidence of acute infarction, hemorrhage, hydrocephalus, extra-axial  collection or mass lesion/mass effect. Essentially occult metastatic disease to the brain. Reference postcontrast brain MRI 09/13/2022 Vascular: No hyperdense vessel or unexpected calcification. Skull: Heterogeneity of the calvarium from known metastatic disease. No acute osseous finding. Extensive cervical fusion which is minimally covered Sinuses/Orbits: No acute finding IMPRESSION: No acute or interval finding. Electronically Signed   By: Jorje Guild M.D.   On: 10/12/2022 05:36    PERFORMANCE STATUS (ECOG) : 3 - Symptomatic, >50% confined to bed  Review of Systems  Constitutional:  Positive for activity change, appetite change, fatigue and unexpected weight change.  Musculoskeletal:  Positive for arthralgias and back pain.  Neurological:  Positive for weakness.  Unless otherwise noted, a complete review of systems is negative.  Physical Exam General: NAD, cachectic Cardiovascular: regular rate and rhythm Pulmonary: clear ant fields Abdomen: soft, nontender, + bowel sounds Extremities: no edema, no joint deformities Skin: no rashes Neurological: AAO x4, mood appropriate   IMPRESSION: Martha [name] presents to the clinic today for symptom-management follow-up. I saw her initially during her recent  hospitalization at [facility].  Patient's husband is present with her on today.  She is resting comfortably in the bed while undergoing her infusion.  No acute distress noted.  Patient is alert and able to engage appropriately in discussions.  I re-introduced myself, Martha [name], and Palliative's role in collaboration with the oncology team. Concept of Palliative Care was introduced as specialized medical care for people and their families living with serious illness.  It focuses on providing relief from the symptoms and stress of a serious illness.  The goal is to improve quality of life for both the patient and the family. Values and goals of care important to patient and family were attempted to be elicited.   Neoplasm related pain Martha [name] endorses increased pain with activity specifically sitting up.  States she spends most of her day in bed.  Her bed at home is adjustable.  Physical therapy is scheduled to come out tomorrow for further evaluation.  She reports when sitting up to a certain position or getting out of bed her pain increases.   Describes pain as sharp, burning, and stabbing at times.  Pain radiates to her side/flank area in addition to her back.  We discussed her current pain regimen.  She is taking 2.5-5 mg of oxycodone IR as needed for pain.  Also taking Lyrica 25 mg daily.  Given sensitivity to recent pain medication changes over the past several weeks patient is reluctant to make any significant adjustments to her medications out of fear of confusion and hallucinations.  We discussed at length descriptions of pain more specifically with the burning aspect with some neuropathic component.  She and husband are agreeable to increase Lyrica 25 mg to twice daily.  We will continue to closely monitor and adjust as needed.  Decreased appetite/weight loss Martha [name] continues to focus on her diet.  Unfortunately her appetite fluctuates day-to-day.  Husband and family are continuing to  push fluids including homemade protein shakes and Ensure.  She has been followed by the  Dietitian.  Patient has received a case of Ensure after her visit today.  She does complain of some feeling of throat soreness and feeling as if food is getting stuck at times.  No significant changes on exam.  Patient is open to speech therapist consult for evaluation and possible recommendations for treatment.  Goals of care We discussed her current illness and what it means in the  larger context of Her on-going co-morbidities. Natural disease trajectory and expectations were discussed.  Ms. Nido and her husband are realistic in their understanding.  They are clear on expressed wishes to continue to treat the treatable allow her every opportunity to continue to thrive.  She acknowledges at some point her health will continue to decline requiring her to focus more on her comfort and foregoing treatments.  She asked appropriate questions of appropriateness of discontinuing treatments.  She is not quite ready to make these decisions and would like to take things one day at a time.  Extensive education provided on the differences between palliative and hospice services.  We discussed what both entities would look like in the home/outpatient.  Patient and husband verbalized understanding and appreciation.  I empathetically approach discussions regarding advanced directives and code status.  Patient has a documented advanced directive that was recently completed.  Her husband Martha [name] is her primary medical decision-maker and her Sister Martha [name] is secondary. Patient confirms wishes for DNR/DNI.  Out of facility form has been completed.   Education provided on the [facility]OL[facility] form.  As requested form was completed today. The patient and family outlined their wishes for the following treatment decisions:  Cardiopulmonary Resuscitation: Do Not Attempt Resuscitation (DNR/No CPR)  Medical Interventions: Limited Additional  Interventions: Use medical treatment, IV fluids and cardiac monitoring as indicated, DO NOT USE intubation or mechanical ventilation. May consider use of less invasive airway support such as BiPAP or CPAP. Also provide comfort measures. Transfer to the hospital if indicated. Avoid intensive care.   Antibiotics: Determine use of limitation of antibiotics when infection occurs  IV Fluids: IV fluids for a defined trial period  Feeding Tube: No feeding tube     I discussed the importance of continued conversation with family and their medical providers regarding overall plan of care and treatment options, ensuring decisions are within the context of the patients values and GOCs.  PLAN: Established therapeutic relationship. Education provided on palliative's role in collaboration with their Oncology/Radiation team. DNR/DNI-out of facility form given to patient and copied into the chart. MOST form completed (see above).  Original form given to patient.  Scanned into [facility] Continue oxycodone as needed for pain Increase Lyrica 25 mg to twice daily Ongoing goals of care discussions and symptom management I will plan to see patient back in 1-2 weeks in collaboration to other oncology appointments.  Patient and husband knows to contact office sooner if needed.   Patient expressed understanding and was in agreement with this plan. She also understands that She can call the clinic at any time with any questions, concerns, or complaints.   Thank you for your referral and allowing Palliative to assist in Martha [name]'s care.   Number and complexity of problems addressed: HIGH - 1 or more chronic illnesses with SEVERE exacerbation, progression, or side effects of treatment - advanced cancer, pain. Any controlled substances utilized were prescribed in the context of palliative care.  Time Total: 45 min   Visit consisted of counseling and education dealing with the complex and emotionally  intense issues of symptom management and palliative care in the setting of serious and potentially life-threatening illness.Greater than 50%  of this time was spent counseling and coordinating care related to the above assessment and plan.  Signed by: Alda Lea, AGPCNP-BC Palliative Medicine Team/[facility] [place]

## 2022-10-26 NOTE — Progress Notes (Signed)
Nutrition follow-up completed with patient during infusion for recurrent breast cancer with mets to brain and bone.  Patient is status post radiation therapy.  She is followed by Dr. Burr Medico.  Weight documented as 88 pounds 13.5 ounces on February 15.  This is the last weight documented as patient is unable to stand to be weighed today.  Labs include sodium 134 and creatinine 0.3.  Patient reports that she has some trouble swallowing and explains how a pill was caught in her throat and it was difficult to get all the way down.  She states dysphagia started after radiation therapy.  Patient has a physical therapist appointment set up to help increase strength.  She is able to stand up with her walker and walk to the bathroom at home.  She is eating small amounts of food and tolerated half of corn dog with some mashed potatoes and a few bites of fried chicken.  She is drinking a lot of different types of fluid.  She drinks 1 Ensure complete and 1 Premier protein daily.  Nutrition diagnosis: Inadequate oral intake continues.  Intervention: Educated patient to continue strategies for trying to increase calories and protein. Recommend referral to speech therapy for evaluation. Recommended patient switch oral nutrition supplements to ensure complete or Ensure Plus 3 times a day. Provided 1 complementary case of Ensure Plus HP. Educated on strategies for adding ice cream or other frozen fruit to vanilla Ensure Plus to increase variety in flavor.    Monitoring, evaluation, goals: Patient will tolerate adequate calories and protein to minimize further weight loss and improve quality of life.  Next visit: Will continue to follow as needed.  **Disclaimer: This note was dictated with voice recognition software. Similar sounding words can inadvertently be transcribed and this note may contain transcription errors which may not have been corrected upon publication of note.**

## 2022-10-26 NOTE — Progress Notes (Signed)
Patient had an issue today when being transferred to the toilet when using the bathroom. Patient ribs made a cracking noise and she had increased pain. Dr. Burr Medico informed and patient to go to have an x-ray after treatment to check for any rib fractures. Patient's husband says this isn't unusual when she is transferred. It sounded more liek a cartilage noise, but unsure without x-ray. Patient and husband aware of plan. Patient did well with her treatment today.

## 2022-10-26 NOTE — Patient Instructions (Addendum)
[facility]  Discharge Instructions: Thank you for choosing [facility] to provide your oncology and hematology care.   If you have a lab appointment with the [facility], please go directly to the [facility] and check in at the registration area.   Wear comfortable clothing and clothing appropriate for easy access to any Portacath or PICC line.   We strive to give you quality time with your provider. You may need to reschedule your appointment if you arrive late (15 or more minutes).  Arriving late affects you and other patients whose appointments are after yours.  Also, if you miss three or more appointments without notifying the office, you may be dismissed from the clinic at the provider's discretion.      For prescription refill requests, have your pharmacy contact our office and allow 72 hours for refills to be completed.    Today you received the following chemotherapy and/or immunotherapy agents: Trastusumab, Pertuzumab, Paclitaxel      To help prevent nausea and vomiting after your treatment, we encourage you to take your nausea medication as directed.  BELOW ARE SYMPTOMS THAT SHOULD BE REPORTED IMMEDIATELY: *FEVER GREATER THAN 100.4 F (38 C) OR HIGHER *CHILLS OR SWEATING *NAUSEA AND VOMITING THAT IS NOT CONTROLLED WITH YOUR NAUSEA MEDICATION *UNUSUAL SHORTNESS OF BREATH *UNUSUAL BRUISING OR BLEEDING *URINARY PROBLEMS (pain or burning when urinating, or frequent urination) *BOWEL PROBLEMS (unusual diarrhea, constipation, pain near the anus) TENDERNESS IN MOUTH AND THROAT WITH OR WITHOUT PRESENCE OF ULCERS (sore throat, sores in mouth, or a toothache) UNUSUAL RASH, SWELLING OR PAIN  UNUSUAL VAGINAL DISCHARGE OR ITCHING   Items with * indicate a potential emergency and should be followed up as soon as possible or go to the Emergency Department if any problems should occur.  Please show the CHEMOTHERAPY ALERT CARD or  IMMUNOTHERAPY ALERT CARD at check-in to the Emergency Department and triage nurse.  Should you have questions after your visit or need to cancel or reschedule your appointment, please contact [facility]  Dept: [phone]  and follow the prompts.  Office hours are 8:00 a.m. to 4:30 p.m. Monday - Friday. Please note that voicemails left after 4:00 p.m. may not be returned until the following business day.  We are closed weekends and major holidays. You have access to a nurse at all times for urgent questions. Please call the main number to the clinic Dept: [phone] and follow the prompts.   For any non-urgent questions, you may also contact your provider using MyChart. We now offer e-Visits for anyone 68 and older to request care online for non-urgent symptoms. For details visit [email or web address].   Also download the MyChart app! Go to the app store, search "MyChart", open the app, select [facility], and log in with your MyChart username and password.  Trastuzumab Injection What is this medication? TRASTUZUMAB (tras TOO zoo mab) treats breast cancer and stomach cancer. It works by blocking a protein that causes cancer cells to grow and multiply. This helps to slow or stop the spread of cancer cells. This medicine may be used for other purposes; ask your health care provider or pharmacist if you have questions. COMMON BRAND NAME(S): Herceptin, Janae Bridgeman, Ontruzant, Trazimera What should I tell my care team before I take this medication? They need to know if you have any of these conditions: Heart failure Lung disease An unusual or allergic reaction to trastuzumab,  other medications, foods, dyes, or preservatives Pregnant or trying to get pregnant Breast-feeding How should I use this medication? This medication is injected into a vein. It is given by your care team in a hospital or clinic setting. Talk to your care team about the  use of this medication in children. It is not approved for use in children. Overdosage: If you think you have taken too much of this medicine contact a poison control center or emergency room at once. NOTE: This medicine is only for you. Do not share this medicine with others. What if I miss a dose? Keep appointments for follow-up doses. It is important not to miss your dose. Call your care team if you are unable to keep an appointment. What may interact with this medication? Certain types of chemotherapy, such as daunorubicin, doxorubicin, epirubicin, idarubicin This list may not describe all possible interactions. Give your health care provider a list of all the medicines, herbs, non-prescription drugs, or dietary supplements you use. Also tell them if you smoke, drink alcohol, or use illegal drugs. Some items may interact with your medicine. What should I watch for while using this medication? Your condition will be monitored carefully while you are receiving this medication. This medication may make you feel generally unwell. This is not uncommon, as chemotherapy affects healthy cells as well as cancer cells. Report any side effects. Continue your course of treatment even though you feel ill unless your care team tells you to stop. This medication may increase your risk of getting an infection. Call your care team for advice if you get a fever, chills, sore throat, or other symptoms of a cold or flu. Do not treat yourself. Try to avoid being around people who are sick. Avoid taking medications that contain aspirin, acetaminophen, ibuprofen, naproxen, or ketoprofen unless instructed by your care team. These medications can hide a fever. Talk to your care team if you may be pregnant. Serious birth defects can occur if you take this medication during pregnancy and for 7 months after the last dose. You will need a negative pregnancy test before starting this medication. Contraception is recommended  while taking this medication and for 7 months after the last dose. Your care team can help you find the option that works for you. Do not breastfeed while taking this medication and for 7 months after stopping treatment. What side effects may I notice from receiving this medication? Side effects that you should report to your care team as soon as possible: Allergic reactions or angioedema--skin rash, itching or hives, swelling of the face, eyes, lips, tongue, arms, or legs, trouble swallowing or breathing Dry cough, shortness of breath or trouble breathing Heart failure--shortness of breath, swelling of the ankles, feet, or hands, sudden weight gain, unusual weakness or fatigue Infection--fever, chills, cough, or sore throat Infusion reactions--chest pain, shortness of breath or trouble breathing, feeling faint or lightheaded Side effects that usually do not require medical attention (report to your care team if they continue or are bothersome): Diarrhea Dizziness Headache Nausea Trouble sleeping Vomiting This list may not describe all possible side effects. Call your doctor for medical advice about side effects. You may report side effects to FDA at 1-800-FDA-1088. Where should I keep my medication? This medication is given in a hospital or clinic. It will not be stored at home. NOTE: This sheet is a summary. It may not cover all possible information. If you have questions about this medicine, talk to your doctor, pharmacist, or  health care provider.  2023 Elsevier/Gold Standard (2021-12-15 00:00:00)  Pertuzumab Injection What is this medication? PERTUZUMAB (per TOOZ ue mab) treats breast cancer. It works by blocking a protein that causes cancer cells to grow and multiply. This helps to slow or stop the spread of cancer cells. It is a monoclonal antibody. This medicine may be used for other purposes; ask your health care provider or pharmacist if you have questions. COMMON BRAND NAME(S):  PERJETA What should I tell my care team before I take this medication? They need to know if you have any of these conditions: Heart failure An unusual or allergic reaction to pertuzumab, other medications, foods, dyes, or preservatives Pregnant or trying to get pregnant Breast-feeding How should I use this medication? This medication is injected into a vein. It is given by your care team in a hospital or clinic setting. Talk to your care team about the use of this medication in children. Special care may be needed. Overdosage: If you think you have taken too much of this medicine contact a poison control center or emergency room at once. NOTE: This medicine is only for you. Do not share this medicine with others. What if I miss a dose? Keep appointments for follow-up doses. It is important not to miss your dose. Call your care team if you are unable to keep an appointment. What may interact with this medication? Interactions are not expected. This list may not describe all possible interactions. Give your health care provider a list of all the medicines, herbs, non-prescription drugs, or dietary supplements you use. Also tell them if you smoke, drink alcohol, or use illegal drugs. Some items may interact with your medicine. What should I watch for while using this medication? Your condition will be monitored carefully while you are receiving this medication. This medication may make you feel generally unwell. This is not uncommon as chemotherapy can affect healthy cells as well as cancer cells. Report any side effects. Continue your course of treatment even though you feel ill unless your care team tells you to stop. Talk to your care team if you may be pregnant. Serious birth defects can occur if you take this medication during pregnancy and for 7 months after the last dose. You will need a negative pregnancy test before starting this medication. Contraception is recommended while taking this  medication and for 7 months after the last dose. Your care team can help you find the option that works for you. Do not breastfeed while taking this medication and for 7 months after the last dose. What side effects may I notice from receiving this medication? Side effects that you should report to your care team as soon as possible: Allergic reactions or angioedema--skin rash, itching or hives, swelling of the face, eyes, lips, tongue, arms, or legs, trouble swallowing or breathing Heart failure--shortness of breath, swelling of the ankles, feet, or hands, sudden weight gain, unusual weakness or fatigue Infusion reactions--chest pain, shortness of breath or trouble breathing, feeling faint or lightheaded Side effects that usually do not require medical attention (report to your care team if they continue or are bothersome): Diarrhea Dry skin Fatigue Hair loss Nausea Vomiting This list may not describe all possible side effects. Call your doctor for medical advice about side effects. You may report side effects to FDA at 1-800-FDA-1088. Where should I keep my medication? This medication is given in a hospital or clinic. It will not be stored at home. NOTE: This sheet is a  summary. It may not cover all possible information. If you have questions about this medicine, talk to your doctor, pharmacist, or health care provider.  2023 Elsevier/Gold Standard (2011-02-07 00:00:00)  Paclitaxel Injection What is this medication? PACLITAXEL (PAK li TAX el) treats some types of cancer. It works by slowing down the growth of cancer cells. This medicine may be used for other purposes; ask your health care provider or pharmacist if you have questions. COMMON BRAND NAME(S): Onxol, Taxol What should I tell my care team before I take this medication? They need to know if you have any of these conditions: Heart disease Liver disease Low white blood cell levels An unusual or allergic reaction to  paclitaxel, other medications, foods, dyes, or preservatives If you or your partner are pregnant or trying to get pregnant Breast-feeding How should I use this medication? This medication is injected into a vein. It is given by your care team in a hospital or clinic setting. Talk to your care team about the use of this medication in children. While it may be given to children for selected conditions, precautions do apply. Overdosage: If you think you have taken too much of this medicine contact a poison control center or emergency room at once. NOTE: This medicine is only for you. Do not share this medicine with others. What if I miss a dose? Keep appointments for follow-up doses. It is important not to miss your dose. Call your care team if you are unable to keep an appointment. What may interact with this medication? Do not take this medication with any of the following: Live virus vaccines Other medications may affect the way this medication works. Talk with your care team about all of the medications you take. They may suggest changes to your treatment plan to lower the risk of side effects and to make sure your medications work as intended. This list may not describe all possible interactions. Give your health care provider a list of all the medicines, herbs, non-prescription drugs, or dietary supplements you use. Also tell them if you smoke, drink alcohol, or use illegal drugs. Some items may interact with your medicine. What should I watch for while using this medication? Your condition will be monitored carefully while you are receiving this medication. You may need blood work while taking this medication. This medication may make you feel generally unwell. This is not uncommon as chemotherapy can affect healthy cells as well as cancer cells. Report any side effects. Continue your course of treatment even though you feel ill unless your care team tells you to stop. This medication can cause  serious allergic reactions. To reduce the risk, your care team may give you other medications to take before receiving this one. Be sure to follow the directions from your care team. This medication may increase your risk of getting an infection. Call your care team for advice if you get a fever, chills, sore throat, or other symptoms of a cold or flu. Do not treat yourself. Try to avoid being around people who are sick. This medication may increase your risk to bruise or bleed. Call your care team if you notice any unusual bleeding. Be careful brushing or flossing your teeth or using a toothpick because you may get an infection or bleed more easily. If you have any dental work done, tell your dentist you are receiving this medication. Talk to your care team if you may be pregnant. Serious birth defects can occur if you take this  medication during pregnancy. Talk to your care team before breastfeeding. Changes to your treatment plan may be needed. What side effects may I notice from receiving this medication? Side effects that you should report to your care team as soon as possible: Allergic reactions--skin rash, itching, hives, swelling of the face, lips, tongue, or throat Heart rhythm changes--fast or irregular heartbeat, dizziness, feeling faint or lightheaded, chest pain, trouble breathing Increase in blood pressure Infection--fever, chills, cough, sore throat, wounds that don't heal, pain or trouble when passing urine, general feeling of discomfort or being unwell Low blood pressure--dizziness, feeling faint or lightheaded, blurry vision Low red blood cell level--unusual weakness or fatigue, dizziness, headache, trouble breathing Painful swelling, warmth, or redness of the skin, blisters or sores at the infusion site Pain, tingling, or numbness in the hands or feet Slow heartbeat--dizziness, feeling faint or lightheaded, confusion, trouble breathing, unusual weakness or fatigue Unusual bruising  or bleeding Side effects that usually do not require medical attention (report to your care team if they continue or are bothersome): Diarrhea Hair loss Joint pain Loss of appetite Muscle pain Nausea Vomiting This list may not describe all possible side effects. Call your doctor for medical advice about side effects. You may report side effects to FDA at 1-800-FDA-1088. Where should I keep my medication? This medication is given in a hospital or clinic. It will not be stored at home. NOTE: This sheet is a summary. It may not cover all possible information. If you have questions about this medicine, talk to your doctor, pharmacist, or health care provider.  2023 Elsevier/Gold Standard (2021-12-29 00:00:00)

## 2022-10-27 ENCOUNTER — Encounter: Payer: BC Managed Care – PPO | Admitting: Physical Medicine and Rehabilitation

## 2022-10-27 ENCOUNTER — Other Ambulatory Visit: Payer: Self-pay | Admitting: Hematology

## 2022-10-27 ENCOUNTER — Telehealth: Payer: Self-pay

## 2022-10-27 DIAGNOSIS — C7951 Secondary malignant neoplasm of bone: Secondary | ICD-10-CM

## 2022-10-27 NOTE — Telephone Encounter (Signed)
-----   Message from Alvera Singh, RN sent at 10/26/2022  5:33 PM EST ----- 10/26/22- patient had first time trastuzumab, pertuzumab and paclitaxel. Tolerated well. Dr. Burr Medico

## 2022-10-27 NOTE — Telephone Encounter (Signed)
LM for patient that this nurse was calling to see how they were doing after their treatment. Please call back to Dr.  Feng's nurse at [phone] if they have any questions or concerns regarding the treatment. 

## 2022-10-30 ENCOUNTER — Telehealth: Payer: Self-pay

## 2022-10-30 NOTE — Telephone Encounter (Addendum)
Called patient and relayed message below as per Dr. Burr Medico. Patient voiced full understanding.   ----- Message from Truitt Merle, MD sent at 10/30/2022  7:39 AM EST ----- Please let pt know the CXR result, no fracture, thanks  Truitt Merle

## 2022-10-31 DIAGNOSIS — Z981 Arthrodesis status: Secondary | ICD-10-CM | POA: Diagnosis not present

## 2022-10-31 DIAGNOSIS — F32A Depression, unspecified: Secondary | ICD-10-CM | POA: Diagnosis not present

## 2022-10-31 DIAGNOSIS — Z7901 Long term (current) use of anticoagulants: Secondary | ICD-10-CM | POA: Diagnosis not present

## 2022-10-31 DIAGNOSIS — Z87891 Personal history of nicotine dependence: Secondary | ICD-10-CM | POA: Diagnosis not present

## 2022-10-31 DIAGNOSIS — Z9012 Acquired absence of left breast and nipple: Secondary | ICD-10-CM | POA: Diagnosis not present

## 2022-10-31 DIAGNOSIS — F419 Anxiety disorder, unspecified: Secondary | ICD-10-CM | POA: Diagnosis not present

## 2022-10-31 DIAGNOSIS — Z170 Estrogen receptor positive status [ER+]: Secondary | ICD-10-CM | POA: Diagnosis not present

## 2022-10-31 DIAGNOSIS — Z79891 Long term (current) use of opiate analgesic: Secondary | ICD-10-CM | POA: Diagnosis not present

## 2022-10-31 DIAGNOSIS — E43 Unspecified severe protein-calorie malnutrition: Secondary | ICD-10-CM | POA: Diagnosis not present

## 2022-10-31 DIAGNOSIS — E876 Hypokalemia: Secondary | ICD-10-CM | POA: Diagnosis not present

## 2022-10-31 DIAGNOSIS — M542 Cervicalgia: Secondary | ICD-10-CM | POA: Diagnosis not present

## 2022-10-31 DIAGNOSIS — C7951 Secondary malignant neoplasm of bone: Secondary | ICD-10-CM | POA: Diagnosis not present

## 2022-10-31 DIAGNOSIS — C50312 Malignant neoplasm of lower-inner quadrant of left female breast: Secondary | ICD-10-CM | POA: Diagnosis not present

## 2022-10-31 DIAGNOSIS — G893 Neoplasm related pain (acute) (chronic): Secondary | ICD-10-CM | POA: Diagnosis not present

## 2022-10-31 DIAGNOSIS — E039 Hypothyroidism, unspecified: Secondary | ICD-10-CM | POA: Diagnosis not present

## 2022-11-01 ENCOUNTER — Telehealth: Payer: Self-pay | Admitting: *Deleted

## 2022-11-01 MED FILL — Dexamethasone Sodium Phosphate Inj 100 MG/10ML: INTRAMUSCULAR | Qty: 1 | Status: AC

## 2022-11-01 NOTE — Assessment & Plan Note (Signed)
-  Diagnosed in 06/2022 --PET scan from 08/03/2022 showed diffuse bone mets  -she underwent cervical laminectomy and fusion by Dr. Marcello Moores on 08/17/2022, biopsy confirmed metastatic breast cancer, ER/PR negative, HER2 positive. -due to cervical cord compression with myelopathy, she underwent a second cervical spine surgery on September 06, 2022, and completed inpatient rehabitation. -She has completed brain, cervical and lumbar spine radiation in Feb 2024 -She was recently hospitalized for altered mental status and confusion, probably secondary to medication -She started first line systemic chemotherapy with weekly Taxol and trastuzumab/Perjeta on 10/25/22. She tolerated first cycle well

## 2022-11-01 NOTE — Assessment & Plan Note (Deleted)
--  She was diagnosed in 12/2017. She is s/p left breast lumpectomy and adjuvant radiation.  -She started anti-estrogen therapy with letrozole on 05/2018. Tolerating well with no issues -lost f/u after visit in 04/2020 until her recurrence in 07/2022

## 2022-11-01 NOTE — Assessment & Plan Note (Signed)
-  She is on low-dose oxycodone, she has developed a significant pain in the right rib cage, probably related to her bone metastasis  -f/u with palliative care clinic NP [facility]

## 2022-11-01 NOTE — Assessment & Plan Note (Signed)
--  She was diagnosed in 12/2017. She is s/p left breast lumpectomy and adjuvant radiation.  -She started anti-estrogen therapy with letrozole on 05/2018. Tolerating well with no issues -lost f/u after visit in 04/2020 until her recurrence in 07/2022

## 2022-11-01 NOTE — Telephone Encounter (Signed)
Called patient to ask which fu appt. she wants, lvm for a return call

## 2022-11-02 ENCOUNTER — Encounter: Payer: Self-pay | Admitting: Hematology

## 2022-11-02 ENCOUNTER — Inpatient Hospital Stay (HOSPITAL_BASED_OUTPATIENT_CLINIC_OR_DEPARTMENT_OTHER): Payer: BC Managed Care – PPO | Admitting: Nurse Practitioner

## 2022-11-02 ENCOUNTER — Inpatient Hospital Stay: Payer: BC Managed Care – PPO | Attending: Genetic Counselor | Admitting: Hematology

## 2022-11-02 ENCOUNTER — Encounter: Payer: Self-pay | Admitting: Nurse Practitioner

## 2022-11-02 ENCOUNTER — Inpatient Hospital Stay: Payer: BC Managed Care – PPO

## 2022-11-02 ENCOUNTER — Other Ambulatory Visit: Payer: Self-pay

## 2022-11-02 VITALS — BP 131/91 | HR 81 | Resp 16 | Ht 63.0 in | Wt 76.9 lb

## 2022-11-02 VITALS — BP 110/70 | HR 94 | Temp 98.0°F | Resp 15

## 2022-11-02 DIAGNOSIS — C7951 Secondary malignant neoplasm of bone: Secondary | ICD-10-CM | POA: Diagnosis not present

## 2022-11-02 DIAGNOSIS — F32A Depression, unspecified: Secondary | ICD-10-CM | POA: Diagnosis not present

## 2022-11-02 DIAGNOSIS — Z170 Estrogen receptor positive status [ER+]: Secondary | ICD-10-CM | POA: Diagnosis not present

## 2022-11-02 DIAGNOSIS — Z95828 Presence of other vascular implants and grafts: Secondary | ICD-10-CM | POA: Insufficient documentation

## 2022-11-02 DIAGNOSIS — Z515 Encounter for palliative care: Secondary | ICD-10-CM | POA: Diagnosis not present

## 2022-11-02 DIAGNOSIS — M792 Neuralgia and neuritis, unspecified: Secondary | ICD-10-CM | POA: Diagnosis not present

## 2022-11-02 DIAGNOSIS — C7931 Secondary malignant neoplasm of brain: Secondary | ICD-10-CM | POA: Diagnosis not present

## 2022-11-02 DIAGNOSIS — M47816 Spondylosis without myelopathy or radiculopathy, lumbar region: Secondary | ICD-10-CM | POA: Diagnosis not present

## 2022-11-02 DIAGNOSIS — Z79899 Other long term (current) drug therapy: Secondary | ICD-10-CM | POA: Diagnosis not present

## 2022-11-02 DIAGNOSIS — G893 Neoplasm related pain (acute) (chronic): Secondary | ICD-10-CM

## 2022-11-02 DIAGNOSIS — Z923 Personal history of irradiation: Secondary | ICD-10-CM | POA: Diagnosis not present

## 2022-11-02 DIAGNOSIS — C50312 Malignant neoplasm of lower-inner quadrant of left female breast: Secondary | ICD-10-CM | POA: Diagnosis not present

## 2022-11-02 DIAGNOSIS — R530 Neoplastic (malignant) related fatigue: Secondary | ICD-10-CM

## 2022-11-02 DIAGNOSIS — Z79890 Hormone replacement therapy (postmenopausal): Secondary | ICD-10-CM | POA: Insufficient documentation

## 2022-11-02 DIAGNOSIS — Z5112 Encounter for antineoplastic immunotherapy: Secondary | ICD-10-CM | POA: Insufficient documentation

## 2022-11-02 LAB — CBC WITH DIFFERENTIAL (CANCER CENTER ONLY)
Abs Immature Granulocytes: 0.04 10*3/uL (ref 0.00–0.07)
Basophils Absolute: 0 10*3/uL (ref 0.0–0.1)
Basophils Relative: 1 %
Eosinophils Absolute: 0 10*3/uL (ref 0.0–0.5)
Eosinophils Relative: 1 %
HCT: 33.9 % — ABNORMAL LOW (ref 36.0–46.0)
Hemoglobin: 11.9 g/dL — ABNORMAL LOW (ref 12.0–15.0)
Immature Granulocytes: 1 %
Lymphocytes Relative: 20 %
Lymphs Abs: 0.6 10*3/uL — ABNORMAL LOW (ref 0.7–4.0)
MCH: 32.2 pg (ref 26.0–34.0)
MCHC: 35.1 g/dL (ref 30.0–36.0)
MCV: 91.6 fL (ref 80.0–100.0)
Monocytes Absolute: 0.2 10*3/uL (ref 0.1–1.0)
Monocytes Relative: 6 %
Neutro Abs: 2.1 10*3/uL (ref 1.7–7.7)
Neutrophils Relative %: 71 %
Platelet Count: 249 10*3/uL (ref 150–400)
RBC: 3.7 MIL/uL — ABNORMAL LOW (ref 3.87–5.11)
RDW: 16.1 % — ABNORMAL HIGH (ref 11.5–15.5)
WBC Count: 3 10*3/uL — ABNORMAL LOW (ref 4.0–10.5)
nRBC: 0.7 % — ABNORMAL HIGH (ref 0.0–0.2)

## 2022-11-02 LAB — CMP (CANCER CENTER ONLY)
ALT: 28 U/L (ref 0–44)
AST: 45 U/L — ABNORMAL HIGH (ref 15–41)
Albumin: 3.9 g/dL (ref 3.5–5.0)
Alkaline Phosphatase: 2090 U/L — ABNORMAL HIGH (ref 38–126)
Anion gap: 8 (ref 5–15)
BUN: 12 mg/dL (ref 8–23)
CO2: 28 mmol/L (ref 22–32)
Calcium: 8.5 mg/dL — ABNORMAL LOW (ref 8.9–10.3)
Chloride: 100 mmol/L (ref 98–111)
Creatinine: <0.3 mg/dL — ABNORMAL LOW (ref 0.44–1.00)
Glucose, Bld: 135 mg/dL — ABNORMAL HIGH (ref 70–99)
Potassium: 3.7 mmol/L (ref 3.5–5.1)
Sodium: 136 mmol/L (ref 135–145)
Total Bilirubin: 1 mg/dL (ref 0.3–1.2)
Total Protein: 6.3 g/dL — ABNORMAL LOW (ref 6.5–8.1)

## 2022-11-02 MED ORDER — HEPARIN SOD (PORK) LOCK FLUSH 100 UNIT/ML IV SOLN
500.0000 [IU] | Freq: Once | INTRAVENOUS | Status: AC | PRN
  Administered 2022-11-02: 500 [IU]

## 2022-11-02 MED ORDER — SODIUM CHLORIDE 0.9% FLUSH
10.0000 mL | INTRAVENOUS | Status: DC | PRN
  Administered 2022-11-02: 10 mL

## 2022-11-02 MED ORDER — SODIUM CHLORIDE 0.9% FLUSH
10.0000 mL | Freq: Once | INTRAVENOUS | Status: AC
  Administered 2022-11-02: 10 mL

## 2022-11-02 MED ORDER — SODIUM CHLORIDE 0.9 % IV SOLN: Freq: Once | INTRAVENOUS | Status: AC

## 2022-11-02 MED ORDER — FAMOTIDINE IN NACL 20-0.9 MG/50ML-% IV SOLN
20.0000 mg | Freq: Once | INTRAVENOUS | Status: AC
  Administered 2022-11-02: 20 mg via INTRAVENOUS
  Filled 2022-11-02: qty 50

## 2022-11-02 MED ORDER — CITALOPRAM HYDROBROMIDE 40 MG PO TABS: 40.0000 mg | ORAL_TABLET | Freq: Every day | ORAL | 3 refills | Status: DC

## 2022-11-02 MED ORDER — PREGABALIN 25 MG PO CAPS: 25.0000 mg | ORAL_CAPSULE | Freq: Two times a day (BID) | ORAL | 2 refills | Status: DC

## 2022-11-02 MED ORDER — DIPHENHYDRAMINE HCL 50 MG/ML IJ SOLN
50.0000 mg | Freq: Once | INTRAMUSCULAR | Status: AC
  Administered 2022-11-02: 50 mg via INTRAVENOUS
  Filled 2022-11-02: qty 1

## 2022-11-02 MED ORDER — SODIUM CHLORIDE 0.9 % IV SOLN
80.0000 mg/m2 | Freq: Once | INTRAVENOUS | Status: AC
  Administered 2022-11-02: 102 mg via INTRAVENOUS
  Filled 2022-11-02: qty 17

## 2022-11-02 MED ORDER — SODIUM CHLORIDE 0.9 % IV SOLN
80.0000 mg/m2 | Freq: Once | INTRAVENOUS | Status: DC
  Filled 2022-11-02: qty 19

## 2022-11-02 MED ORDER — SODIUM CHLORIDE 0.9 % IV SOLN
10.0000 mg | Freq: Once | INTRAVENOUS | Status: AC
  Administered 2022-11-02: 10 mg via INTRAVENOUS
  Filled 2022-11-02: qty 10

## 2022-11-02 NOTE — Progress Notes (Signed)
[place]   [phone] [phone]) [phone]   Clinic Follow up Note   Patient Care Team: [name], MD as PCP - General (Family Medicine) [name], MD as Consulting Physician (General Surgery) Truitt Merle, MD as Consulting Physician (Hematology) Eppie Gibson, MD as Attending Physician (Radiation Oncology) Alla Feeling, NP as Nurse Practitioner (Nurse Practitioner)  Date of Service:  11/02/2022  CHIEF COMPLAINT: f/u of left breast cancer and Right    CURRENT THERAPY:  Zometa q3 months/   Paclitaxel+ Trastuzumab+ Pertuzumab q21d x 8 cycles /Trastuzumab+Pertuzumab q21 x4 cyclesstarting 10/19/2022    ASSESSMENT:  Martha [name] is a 62 y.o. female with   Metastatic breast cancer to bone and brain -Diagnosed in 06/2022 --PET scan from 08/03/2022 showed diffuse bone mets  -she underwent cervical laminectomy and fusion by Dr. Marcello Moores on 08/17/2022, biopsy confirmed metastatic breast cancer, ER/PR negative, HER2 positive. -due to cervical cord compression with myelopathy, she underwent a second cervical spine surgery on September 06, 2022, and completed inpatient rehabitation. -She has completed brain, cervical and lumbar spine radiation in Feb 2024 -She was recently hospitalized for altered mental status and confusion, probably secondary to medication -She started first line systemic chemotherapy with weekly Taxol and trastuzumab/Perjeta on 10/25/22. She tolerated first cycle well and her rib cage pain has improved after first treatment. -Continue therapy, plan to repeat scan in 2 to 3 months.  Malignant neoplasm of lower-inner quadrant of left breast in female, estrogen receptor positive ([place]) --She was diagnosed in 12/2017. She is s/p left breast lumpectomy and adjuvant radiation.  -She started anti-estrogen therapy with letrozole on 05/2018. Tolerating well with no issues -lost f/u after visit in 04/2020 until her recurrence in 07/2022   Cancer  related pain -She is on low-dose oxycodone, she has developed a significant pain in the right rib cage, probably related to her bone metastasis  -f/u with palliative care clinic NP Nikki   Diarrhea -Started yesterday, possible related to pejeta, I encouraged her to use Imodium as needed  PLAN: -labs pending -proceed with Taxol if labs are adequate. -lab and treatment 11/09/2022 -f/u in 2 weeks   SUMMARY OF ONCOLOGIC HISTORY: Oncology History Overview Note  Cancer Staging Malignant neoplasm of lower-inner quadrant of left breast in female, estrogen receptor positive ([place]) Staging form: Breast, AJCC 8th Edition - Clinical stage from 01/16/2018: Stage IA (cT1c, cN0, cM0, G2, ER+, PR+, HER2-) - Signed by Truitt Merle, MD on 01/23/2018 - Pathologic: Stage IA (pT1c, pN1, cM0, G1, ER+, PR+, HER2-) - Signed by Eppie Gibson, MD on 04/09/2018     Malignant neoplasm of lower-inner quadrant of left breast in female, estrogen receptor positive ([place])  01/15/2018 Mammogram   Diagnositc Mammogram 01/15/18  IMPRESSION: 1. Suspicious 1.2 x 1.4 x 1.3 cm mixed echogenicity mass left breast 7 o'clock position retroareolar location at the site of palpable concern.. 2. Indeterminate Within the left breast 7:30 o'clock retroareolar location, adjacent to the palpable mass, is a 0.5 x 0.4 x 0.5 cm oval circumscribed hypoechoic mass. 3. Indeterminate calcifications within the lateral left breast. Location of these calcifications is not definitely confirmed on the true lateral view.    01/16/2018 Initial Biopsy   Diagnosis 01/16/18 1. Breast, left, needle core biopsy, 7:30 o'clock (ribbon clip) - FIBROCYSTIC CHANGES WITH SCLEROSING ADENOSIS AND CALCIFICATIONS. - FIBROADENOMATOID CHANGE. - NO MALIGNANCY IDENTIFIED. 2. Breast, left, needle core biopsy, 7 o'clock position (coil clip) - INVASIVE MAMMARY CARCINOMA, MSBR GRADE I/II. - SEE MICROSCOPIC  DESCRIPTION Microscopic Comment  ADDENDUM:  Immunohistochemistry for E-Cadherin is strongly positive in the tumor consistent with ductal carcinoma. (JDP:ah 01/17/18)   01/16/2018 Receptors her2   Estrogen Receptor: 100%, POSITIVE, STRONG STAINING INTENSITY Progesterone Receptor: 50%, POSITIVE, STRONG STAINING INTENSITY Proliferation Marker Ki67: 20% HER2 Negative   01/16/2018 Cancer Staging   Staging form: Breast, AJCC 8th Edition - Clinical stage from 01/16/2018: Stage IA (cT1c, cN0, cM0, G2, ER+, PR+, HER2-) - Signed by Truitt Merle, MD on 01/23/2018   01/22/2018 Initial Diagnosis   Malignant neoplasm of lower-inner quadrant of left breast in female, estrogen receptor positive ([place])   02/21/2018 Surgery    LEFT BREAST LUMPECTOMY WITH AXILLARY LYMPH NODE BIOPSY by Dr. Barry Dienes  02/21/18   02/21/2018 Pathology Results   Diagnosis 02/21/18 1. Breast, lumpectomy, Left - INVASIVE DUCTAL CARCINOMA, GRADE I, 1.6 CM. - DUCTAL CARCINOMA IN SITU, INTERMEDIATE NUCLEAR GRADE. - ANTERIOR AND MEDIAL RESECTION MARGINS ARE POSITIVE FOR CARCINOMA. - NEGATIVE FOR LYMPHOVASCULAR OR PERINEURAL INVASION. - BACKGROUND BREAST TISSUE WITH FIBROCYSTIC CHANGE, INCLUDING SCLEROSING ADENOSIS. - BIOPSY SITE CHANGES. - SEE ONCOLOGY TABLE. 2. Lymph node, sentinel, biopsy, Left Axillary #1 - METASTATIC BREAST CARCINOMA TO A LYMPH NODE, 1.0 CM IN GREATEST DIMENSION, WITH EXTRANODAL EXTENSION (1/1). 3. Lymph node, sentinel, biopsy, Left Axillary #2 - LYMPH NODE, NEGATIVE FOR CARCINOMA (0/1).    02/21/2018 Miscellaneous   Mammaprint 02/21/18 Low Risk with 10-year risk of recurrnce at 10% -No potential signifcant chemotherapy benefit   03/20/2018 Pathology Results   RE-EXCISION OF BREAST LUMPECTOMY by Dr. Barry Dienes  Diagnosis 03/20/18 1. Breast, excision, Left new anterior margin - FIBROCYSTIC CHANGES WITH ADENOSIS AND CALCIFICATIONS. - HEALING BIOPSY SITE. - THERE IS NO EVIDENCE OF MALIGNANCY. 2. Breast, excision, Left new medial margin - FIBROCYSTIC CHANGES WITH  ADENOSIS AND CALCIFICATIONS. - HEALING BIOPSY SITE. - THERE IS NO EVIDENCE OF MALIGNANCY. Microscopic Comment 1. -2. The surgical resection margin(s) of the specimen were inked and microscopically evaluated. (JBK:kh 03-22-18)   04/09/2018 Cancer Staging   Staging form: Breast, AJCC 8th Edition - Pathologic: Stage IA (pT1c, pN1, cM0, G1, ER+, PR+, HER2-) - Signed by Eppie Gibson, MD on 04/09/2018   04/22/2018 - 06/03/2018 Radiation Therapy   Radaiton with Dr. Isidore Moos 04/22/18-06/03/18   05/2018 -  Anti-estrogen oral therapy   Letrozole 2.'5mg'$  started 05/2018    Survivorship   Per Cira Rue, NP    05/04/2022 Imaging    IMPRESSION: Cervical spondylosis, as described.   Nonspecific straightening of the expected cervical lordosis.   07/24/2022 Imaging    IMPRESSION: 1. Extensive osseous metastatic disease with pathologic fracture at the base of dens and C2 right lateral mass. Extraosseous tumor at C1 and C2 likely impinging on the right C2 and C3 nerve roots. 2. Degenerative cord impingement at C4-5 to C6-7. Biforaminal impingement at C5-6 and C6-7.   08/03/2022 PET scan    IMPRESSION: 1. Large volume osseous metastasis. 2. Low right cervical and probable right axillary nodal metastasis. 3. Subtle heterogeneous activity throughout the liver with suggestion of small liver lesions (likely new compared to chest CT of 07/16/2020). Findings are overall moderately suspicious for hepatic metastasis. Pre and post contrast abdominal MRI (preferred) or CT could confirm. 4. Right-sided pleural thickening and trace pleural fluid. Right base airspace disease is favored to represent chronic atelectasis. 5. Aortic atherosclerosis (ICD10-I70.0) and emphysema (ICD10-J43.9).     08/11/2022 Genetic Testing   Negative genetic testing on the CancerNext-Expanded+RNAinsight panel.  The report date is August 11, 2022.  The  CancerNext-Expanded gene panel offered by [employer] and includes  sequencing and rearrangement analysis for the following 77 genes: AIP, ALK, APC*, ATM*, AXIN2, BAP1, BARD1, BLM, BMPR1A, BRCA1*, BRCA2*, BRIP1*, CDC73, CDH1*, CDK4, CDKN1B, CDKN2A, CHEK2*, CTNNA1, DICER1, FANCC, FH, FLCN, GALNT12, KIF1B, LZTR1, MAX, MEN1, MET, MLH1*, MSH2*, MSH3, MSH6*, MUTYH*, NBN, NF1*, NF2, NTHL1, PALB2*, PHOX2B, PMS2*, POT1, PRKAR1A, PTCH1, PTEN*, RAD51C*, RAD51D*, RB1, RECQL, RET, SDHA, SDHAF2, SDHB, SDHC, SDHD, SMAD4, SMARCA4, SMARCB1, SMARCE1, STK11, SUFU, TMEM127, TP53*, TSC1, TSC2, VHL and XRCC2 (sequencing and deletion/duplication); EGFR, EGLN1, HOXB13, KIT, MITF, PDGFRA, POLD1, and POLE (sequencing only); EPCAM and GREM1 (deletion/duplication only). DNA and RNA analyses performed for * genes.    Lobular carcinoma in situ (LCIS) of right breast  01/20/2020 Mammogram   Diagnostic Mammogram 01/20/20 IMPRESSION: 1.  Stable post lumpectomy changes of the left breast.   2. Suspicious microcalcifications over the right upper outer quadrant spanning 3.6 cm.   02/02/2020 Initial Biopsy   Diagnosis 02/02/20 Breast, right, needle core biopsy, upper outer quadrant, x clip - LOBULAR CARCINOMA IN SITU WITH PLEOMORPHIC FEATURES AND CALCIFICATIONS, INVOLVING ADENOSIS. SEE NOTE Diagnosis Note Immunohistochemical stain for E-cadherin is negative in the lesional cells, consistent with a lobular phenotype. Immunostains for p63, SMM 1 and calponin do not show evidence of invasive carcinoma.    02/04/2020 Initial Diagnosis   Lobular carcinoma in situ (LCIS) of right breast   03/18/2020 Surgery   RIGHT BREAST LUMPECTOMY WITH RADIOACTIVE SEED LOCALIZATION by Dr Alessandra Bevels    03/18/2020 Pathology Results   FINAL MICROSCOPIC DIAGNOSIS:   A. BREAST, RIGHT, LUMPECTOMY:  - Pleomorphic lobular carcinoma in situ with calcifications and  underlying complex sclerosing lesion, adenosis and fibroadenomatoid  change.  - Margins of resection are not involved (Closest margins: < 1 mm,  anterior, posterior,  inferior and medial).  - Biopsy site.    COMMENT:   P63, Calponin and SMM-1 demonstrate the presence of myoepithelium in the  select focus.    Metastatic breast cancer to bone and brain  05/04/2022 Imaging    IMPRESSION: Cervical spondylosis, as described.   Nonspecific straightening of the expected cervical lordosis.   07/24/2022 Imaging    IMPRESSION: 1. Extensive osseous metastatic disease with pathologic fracture at the base of dens and C2 right lateral mass. Extraosseous tumor at C1 and C2 likely impinging on the right C2 and C3 nerve roots. 2. Degenerative cord impingement at C4-5 to C6-7. Biforaminal impingement at C5-6 and C6-7.   08/02/2022 Initial Diagnosis   Cancer, metastatic to bone ([place])   08/03/2022 PET scan    IMPRESSION: 1. Large volume osseous metastasis. 2. Low right cervical and probable right axillary nodal metastasis. 3. Subtle heterogeneous activity throughout the liver with suggestion of small liver lesions (likely new compared to chest CT of 07/16/2020). Findings are overall moderately suspicious for hepatic metastasis. Pre and post contrast abdominal MRI (preferred) or CT could confirm. 4. Right-sided pleural thickening and trace pleural fluid. Right base airspace disease is favored to represent chronic atelectasis. 5. Aortic atherosclerosis (ICD10-I70.0) and emphysema (ICD10-J43.9).     10/26/2022 -  Chemotherapy   Patient is on Treatment Plan : BREAST Paclitaxel D1,8,15 + Trastuzumab D1 + Pertuzumab D1 q21d x 8 cycles / Trastuzumab D1 + Pertuzumab D1 q21d x 4 cycles        INTERVAL HISTORY:  Synetta Gogue is here for a follow up of left breast cancer and Right   She was last seen by me on 10/26/2022 She presents to the  clinic accompanied by husband. Pt states she had some diarrhea, denies having blood in stool. Pt stated the last treatment went well. Pt state she is doing fine. Pt report her appetite is still not good. Pt state the pain  in her right rib cage has subsided.     All other systems were reviewed with the patient and are negative.  MEDICAL HISTORY:  Past Medical History:  Diagnosis Date   Anxiety    Cancer ([place]) 2022   right breast LCIS   Cancer ([place]) 2019   left breast   Depression    Dysrhythmia    SVT, s/p ablation ~ 2012 in at [facility]   Ectopic pregnancy    Family history of breast cancer    Family history of melanoma    History of radiation therapy 04/22/18-06/03/18   Left Breast, left SCV, axilla 50 Gy in 25 fractions, Left breast boost 10 Gy in 5 fractions.    Hypothyroidism    Personal history of radiation therapy    PONV (postoperative nausea and vomiting)    Thyroid disease     SURGICAL HISTORY: Past Surgical History:  Procedure Laterality Date   APPENDECTOMY     BILATERAL SALPINGECTOMY     BREAST BIOPSY Right 2014   fibroadenoma   BREAST BIOPSY Left 01/16/2018   BREAST BIOPSY Right 02/02/2020   LCIS   BREAST BIOPSY Right 08/04/2020   x2 LCIS   BREAST BIOPSY Right 08/13/2020   x2   BREAST EXCISIONAL BIOPSY Left 1990   benign   BREAST EXCISIONAL BIOPSY Right 02/02/2020   LCIS   BREAST LUMPECTOMY Left 01/22/2018   BREAST LUMPECTOMY WITH AXILLARY LYMPH NODE BIOPSY Left 02/21/2018   Procedure: LEFT BREAST LUMPECTOMY WITH AXILLARY LYMPH NODE BIOPSY;  Surgeon: [name], MD;  Location: [facility];  Service: General;  Laterality: Left;   BREAST LUMPECTOMY WITH RADIOACTIVE SEED LOCALIZATION Right 03/18/2020   Procedure: RIGHT BREAST LUMPECTOMY WITH RADIOACTIVE SEED LOCALIZATION;  Surgeon: [name], MD;  Location: [facility];  Service: General;  Laterality: Right;   BREAST SURGERY Left 1993   cyst removed   ENDOMETRIAL ABLATION     EYE SURGERY     GLAUCOMA SURGERY Bilateral    IR IMAGING GUIDED PORT INSERTION  10/16/2022   POSTERIOR CERVICAL FUSION/FORAMINOTOMY N/A 08/17/2022   Procedure: Cervical One-Cervical Four POSTERIOR CERVICAL FUSION,  Cervical One LAMINECTOMY  REDUCTION OF Cervical Two Fracture, Biopsy of Right Cerical Two Pars  Lesion;  Surgeon: Vallarie Mare, MD;  Location: [facility];  Service: Neurosurgery;  Laterality: N/A;   POSTERIOR CERVICAL FUSION/FORAMINOTOMY N/A 09/06/2022   Procedure: Posterior Cervical Fusion, Foraminotomy , Cervical Five-Six, Cervical Six-Seven; Extension of  fusion Cervical Four- Thoracic One;  Surgeon: Vallarie Mare, MD;  Location: [facility];  Service: Neurosurgery;  Laterality: N/A;   RADIOACTIVE SEED GUIDED EXCISIONAL BREAST BIOPSY Right 04/28/2021   Procedure: RADIOACTIVE SEED GUIDED EXCISIONAL RIGHT BREAST BIOPSY X2;  Surgeon: [name], MD;  Location: [facility];  Service: General;  Laterality: Right;   RE-EXCISION OF BREAST LUMPECTOMY Left 03/20/2018   Procedure: RE-EXCISION OF BREAST LUMPECTOMY;  Surgeon: [name], MD;  Location: [facility];  Service: General;  Laterality: Left;    I have reviewed the social history and family history with the patient and they are unchanged from previous note.  ALLERGIES:  is allergic to morphine and related.  MEDICATIONS:  Current Outpatient Medications  Medication Sig Dispense Refill   acetaminophen (TYLENOL) 500  MG tablet Take 2 tablets (1,000 mg total) by mouth every 8 (eight) hours. 30 tablet 0   citalopram (CELEXA) 40 MG tablet Take 1 tablet (40 mg total) by mouth daily. 30 tablet 3   levothyroxine (SYNTHROID) 75 MCG tablet Take 1 tablet (75 mcg total) by mouth daily before breakfast. 30 tablet 0   lidocaine (XYLOCAINE) 2 % solution Patient: Mix 1part 2% viscous lidocaine, 1part H20. Swallow 59m of diluted mixture, 360m before meals and at bedtime, up to QID 100 mL 3   lidocaine-prilocaine (EMLA) cream Apply to affected area once (Patient taking differently: Apply 1 Application topically daily as needed (burning).) 30 g 3   LORazepam (ATIVAN) 0.5 MG tablet Take 1 tablet (0.5 mg total) by mouth every 6 (six) hours as needed for  anxiety (agitation). 30 tablet 0   Mouthwashes (MOUTH RINSE) LIQD solution 15 mLs by Mouth Rinse route as needed (for oral care).  0   ondansetron (ZOFRAN) 8 MG tablet Take 1 tablet (8 mg total) by mouth every 8 (eight) hours as needed for nausea or vomiting. 20 tablet 1   OVER THE COUNTER MEDICATION Take 1 tablet by mouth daily. Protandim Supplement     oxyCODONE (OXY IR/ROXICODONE) 5 MG immediate release tablet Take 0.5-1 tablets (2.5-5 mg total) by mouth every 4 (four) hours as needed for moderate pain or severe pain (dyspnea). 30 tablet 0   pregabalin (LYRICA) 25 MG capsule Take 1 capsule (25 mg total) by mouth 2 (two) times daily. 60 capsule 2   senna-docusate (SENOKOT-S) 8.6-50 MG tablet Take 1 tablet by mouth 2 (two) times daily. 60 tablet 0   No current facility-administered medications for this visit.   Facility-Administered Medications Ordered in Other Visits  Medication Dose Route Frequency Provider Last Rate Last Admin   diphenhydrAMINE (BENADRYL) injection 50 mg  50 mg Intravenous Once FeTruitt MerleMD       famotidine (PEPCID) IVPB 20 mg premix  20 mg Intravenous Once FeTruitt MerleMD 200 mL/hr at 11/02/22 1136 20 mg at 11/02/22 1136   heparin lock flush 100 unit/mL  500 Units Intracatheter Once PRN FeTruitt MerleMD       PACLitaxel (TAXOL) 114 mg in sodium chloride 0.9 % 250 mL chemo infusion (</= '80mg'$ /m2)  80 mg/m2 (Treatment Plan Recorded) Intravenous Once FeTruitt MerleMD       sodium chloride flush (NS) 0.9 % injection 10 mL  10 mL Intracatheter PRN FeTruitt MerleMD        PHYSICAL EXAMINATION: ECOG PERFORMANCE STATUS: 3 - Symptomatic, >50% confined to bed  Vitals:   11/02/22 0940  BP: 110/70  Pulse: 94  Resp: 15  Temp: 98 F (36.7 C)  SpO2: 100%   Wt Readings from Last 3 Encounters:  11/02/22 76 lb 15.1 oz (34.9 kg)  10/12/22 88 lb 13.5 oz (40.3 kg)  09/26/22 88 lb 13.5 oz (40.3 kg)     GENERAL:alert, no distress and comfortable SKIN: skin color normal, no rashes or  significant lesions EYES: normal, Conjunctiva are pink and non-injected, sclera clear  NEURO: alert & oriented x 3 with fluent speech   LABORATORY DATA:  I have reviewed the data as listed    Latest Ref Rng & Units 11/02/2022    9:15 AM 10/26/2022    8:11 AM 10/19/2022    5:00 AM  CBC  WBC 4.0 - 10.5 K/uL 3.0  12.7  10.3   Hemoglobin 12.0 - 15.0 g/dL 11.9  13.2  11.8  Hematocrit 36.0 - 46.0 % 33.9  37.5  34.7   Platelets 150 - 400 K/uL 249  200  175         Latest Ref Rng & Units 11/02/2022    9:15 AM 10/26/2022    8:11 AM 10/20/2022    5:50 AM  CMP  Glucose 70 - 99 mg/dL 135  105  103   BUN 8 - 23 mg/dL '12  15  9   '$ Creatinine 0.44 - 1.00 mg/dL <0.30  <0.30  <0.30   Sodium 135 - 145 mmol/L 136  134  131   Potassium 3.5 - 5.1 mmol/L 3.7  3.7  4.1   Chloride 98 - 111 mmol/L 100  97  100   CO2 22 - 32 mmol/L '28  22  21   '$ Calcium 8.9 - 10.3 mg/dL 8.5  8.4  8.5   Total Protein 6.5 - 8.1 g/dL 6.3  5.8  5.5   Total Bilirubin 0.3 - 1.2 mg/dL 1.0  0.8  1.1   Alkaline Phos 38 - 126 U/L 2,090  1,766  1,349   AST 15 - 41 U/L 45  90  105   ALT 0 - 44 U/L 28  30  48       RADIOGRAPHIC STUDIES: I have personally reviewed the radiological images as listed and agreed with the findings in the report. No results found.    No orders of the defined types were placed in this encounter.  All questions were answered. The patient knows to call the clinic with any problems, questions or concerns. No barriers to learning was detected. The total time spent in the appointment was 30 minutes.     Truitt Merle, MD 11/02/2022   Felicity Coyer, CMA, am acting as scribe for Truitt Merle, MD.   I have reviewed the above documentation for accuracy and completeness, and I agree with the above.

## 2022-11-02 NOTE — Progress Notes (Signed)
[facility]  Telephone:(336) [phone] Fax:(336) [phone]   Name: Martha [name] Date: 11/02/2022 MRN: [id number]  DOB: December 19, 1960  Patient Care Team: [name], MD as PCP - General (Family Medicine) [name], MD as Consulting Physician (General Surgery) Truitt Merle, MD as Consulting Physician (Hematology) Eppie Gibson, MD as Attending Physician (Radiation Oncology) Alla Feeling, NP as Nurse Practitioner (Nurse Practitioner)    INTERVAL HISTORY: Martha [name] is a 62 y.o. female with oncologic medical history including ER+ breast cancer (01/2018), right breast cancer (04/2021), now with metastatic disease progression involving brain and liver, pathological fractures with osseous mets s/p brain radiation. .  Palliative ask to see for symptom management and goals of care.   SOCIAL HISTORY:     reports that she has been smoking cigarettes. She has a 20.00 pack-year smoking history. She has never used smokeless tobacco. She reports that she does not currently use alcohol after a past usage of about 28.0 standard drinks of alcohol per week. She reports that she does not use drugs.  ADVANCE DIRECTIVES:  Has documents on file  CODE STATUS: DNR  PAST MEDICAL HISTORY: Past Medical History:  Diagnosis Date   Anxiety    Cancer ([place]) 2022   right breast LCIS   Cancer ([place]) 2019   left breast   Depression    Dysrhythmia    SVT, s/p ablation ~ 2012 in at [facility]   Ectopic pregnancy    Family history of breast cancer    Family history of melanoma    History of radiation therapy 04/22/18-06/03/18   Left Breast, left SCV, axilla 50 Gy in 25 fractions, Left breast boost 10 Gy in 5 fractions.    Hypothyroidism    Personal history of radiation therapy    PONV (postoperative nausea and vomiting)    Thyroid disease     ALLERGIES:  is allergic to morphine and related.  MEDICATIONS:  Current Outpatient  Medications  Medication Sig Dispense Refill   acetaminophen (TYLENOL) 500 MG tablet Take 2 tablets (1,000 mg total) by mouth every 8 (eight) hours. 30 tablet 0   citalopram (CELEXA) 40 MG tablet Take 1 tablet (40 mg total) by mouth daily. 30 tablet 0   levothyroxine (SYNTHROID) 75 MCG tablet Take 1 tablet (75 mcg total) by mouth daily before breakfast. 30 tablet 0   lidocaine (XYLOCAINE) 2 % solution Patient: Mix 1part 2% viscous lidocaine, 1part H20. Swallow 70m of diluted mixture, 355m before meals and at bedtime, up to QID 100 mL 3   lidocaine-prilocaine (EMLA) cream Apply to affected area once (Patient taking differently: Apply 1 Application topically daily as needed (burning).) 30 g 3   LORazepam (ATIVAN) 0.5 MG tablet Take 1 tablet (0.5 mg total) by mouth every 6 (six) hours as needed for anxiety (agitation). 30 tablet 0   Mouthwashes (MOUTH RINSE) LIQD solution 15 mLs by Mouth Rinse route as needed (for oral care).  0   ondansetron (ZOFRAN) 8 MG tablet Take 1 tablet (8 mg total) by mouth every 8 (eight) hours as needed for nausea or vomiting. 20 tablet 1   OVER THE COUNTER MEDICATION Take 1 tablet by mouth daily. Protandim Supplement     oxyCODONE (OXY IR/ROXICODONE) 5 MG immediate release tablet Take 0.5-1 tablets (2.5-5 mg total) by mouth every 4 (four) hours as needed for moderate pain or severe pain (dyspnea). 30 tablet 0   pregabalin (LYRICA) 25 MG capsule Take 1  capsule (25 mg total) by mouth daily. 30 capsule 0   senna-docusate (SENOKOT-S) 8.6-50 MG tablet Take 1 tablet by mouth 2 (two) times daily. 60 tablet 0   No current facility-administered medications for this visit.    VITAL SIGNS: There were no vitals taken for this visit. There were no vitals filed for this visit.  Estimated body mass index is 16.25 kg/m as calculated from the following:   Height as of 10/12/22: '5\' 2"'$  (1.575 m).   Weight as of 10/12/22: 88 lb 13.5 oz (40.3 kg).   PERFORMANCE STATUS (ECOG) : 3 -  Symptomatic, >50% confined to bed   Physical Exam General: NAD, cachectic, in wheelchair  Cardiovascular: regular rate and rhythm Pulmonary: clear ant fields Abdomen: soft, nontender, + bowel sounds Extremities: no edema, no joint deformities Skin: no rashes, muscle wasting  Neurological: AAO x5   IMPRESSION: Martha [name] presents to clinic today for follow-up. No acute distress. Her husband is present. Patient shares she has been doing better over the past week. Able to get some rest, getting up and moving a little more. Mr. Dreyer shares she got up and was able to get in the shower with his assistance. Martha [name] speaks to her appreciation of this and allowing her to feel much better. Appetite has improved.   Neoplasm related pain Martha [name] reports pain has improved. Physical therapy also has come out to the home and scheduled to come back in the next few days for ongoing support. Feels the increase in her Lyrica (adding night time dose) has significantly allowed for some of her improvement. Does continue to have pain and discomfort with movement however feels it is manageable.    We discussed her current pain regimen.  She is taking 2.5-5 mg of oxycodone IR as needed for pain.  Also taking Lyrica 25 mg twice daily. Tolerating evening dose without difficulty. Given sensitivity to recent pain medication changes over the past several weeks patient is reluctant to make any significant adjustments to her medications out of fear of confusion and hallucinations.    We will continue to closely monitor and adjust as needed.   Decreased appetite/weight loss Appetite has improved. Drinking 2-3 protein drinks daily in addition to other nutrition. Weight is 76lbs.    Goals of care  10/26/22-We discussed her current illness and what it means in the larger context of Her on-going co-morbidities. Natural disease trajectory and expectations were discussed.   Ms. [name] and her husband are realistic in their  understanding.  They are clear on expressed wishes to continue to treat the treatable allow her every opportunity to continue to thrive.  She acknowledges at some point her health will continue to decline requiring her to focus more on her comfort and foregoing treatments.  She asked appropriate questions of appropriateness of discontinuing treatments.  She is not quite ready to make these decisions and would like to take things one day at a time.   Extensive education provided on the differences between palliative and hospice services.  We discussed what both entities would look like in the home/outpatient.  Patient and husband verbalized understanding and appreciation.   I empathetically approach discussions regarding advanced directives and code status.  Patient has a documented advanced directive that was recently completed.  Her husband Martha [name] is her primary medical decision-maker and her Sister Martha [name] is secondary. Patient confirms wishes for DNR/DNI.  Out of facility form has been completed.    Education provided on the MOLST form.  As requested form was completed today.  We discussed Her current illness and what it means in the larger context of Her on-going co-morbidities. Natural disease trajectory and expectations were discussed.  I discussed the importance of continued conversation with family and their medical providers regarding overall plan of care and treatment options, ensuring decisions are within the context of the patients values and GOCs.  PLAN: DNR/DNI/MOST form on file Continue oxycodone as needed for pain Lyrica 25 mg twice daily (tolerating) Ongoing goals of care discussions and symptom management I will plan to see patient back in 2-4 weeks in collaboration to other oncology appointments.  Patient and husband knows to contact office sooner if needed.   Patient expressed understanding and was in agreement with this plan. She also understands that She can call the clinic  at any time with any questions, concerns, or complaints.   Any controlled substances utilized were prescribed in the context of palliative care. PDMP has been reviewed.   Time Total: 25 min   Visit consisted of counseling and education dealing with the complex and emotionally intense issues of symptom management and palliative care in the setting of serious and potentially life-threatening illness.Greater than 50%  of this time was spent counseling and coordinating care related to the above assessment and plan.  Alda Lea, AGPCNP-BC  Palliative Medicine Team/[place]  *Please note that this is a verbal dictation therefore any spelling or grammatical errors are due to the "[place] [employer]" system interpretation.

## 2022-11-02 NOTE — Patient Instructions (Signed)
[facility]  Discharge Instructions: Thank you for choosing [facility] to provide your oncology and hematology care.   If you have a lab appointment with the [facility], please go directly to the [facility] and check in at the registration area.   Wear comfortable clothing and clothing appropriate for easy access to any Portacath or PICC line.   We strive to give you quality time with your provider. You may need to reschedule your appointment if you arrive late (15 or more minutes).  Arriving late affects you and other patients whose appointments are after yours.  Also, if you miss three or more appointments without notifying the office, you may be dismissed from the clinic at the provider's discretion.      For prescription refill requests, have your pharmacy contact our office and allow 72 hours for refills to be completed.    Today you received the following chemotherapy and/or immunotherapy agents Paclitaxel      To help prevent nausea and vomiting after your treatment, we encourage you to take your nausea medication as directed.  BELOW ARE SYMPTOMS THAT SHOULD BE REPORTED IMMEDIATELY: *FEVER GREATER THAN 100.4 F (38 C) OR HIGHER *CHILLS OR SWEATING *NAUSEA AND VOMITING THAT IS NOT CONTROLLED WITH YOUR NAUSEA MEDICATION *UNUSUAL SHORTNESS OF BREATH *UNUSUAL BRUISING OR BLEEDING *URINARY PROBLEMS (pain or burning when urinating, or frequent urination) *BOWEL PROBLEMS (unusual diarrhea, constipation, pain near the anus) TENDERNESS IN MOUTH AND THROAT WITH OR WITHOUT PRESENCE OF ULCERS (sore throat, sores in mouth, or a toothache) UNUSUAL RASH, SWELLING OR PAIN  UNUSUAL VAGINAL DISCHARGE OR ITCHING   Items with * indicate a potential emergency and should be followed up as soon as possible or go to the Emergency Department if any problems should occur.  Please show the CHEMOTHERAPY ALERT CARD or IMMUNOTHERAPY ALERT CARD at  check-in to the Emergency Department and triage nurse.  Should you have questions after your visit or need to cancel or reschedule your appointment, please contact [facility]  Dept: [phone]  and follow the prompts.  Office hours are 8:00 a.m. to 4:30 p.m. Monday - Friday. Please note that voicemails left after 4:00 p.m. may not be returned until the following business day.  We are closed weekends and major holidays. You have access to a nurse at all times for urgent questions. Please call the main number to the clinic Dept: [phone] and follow the prompts.   For any non-urgent questions, you may also contact your provider using MyChart. We now offer e-Visits for anyone 51 and older to request care online for non-urgent symptoms. For details visit [email or web address].   Also download the MyChart app! Go to the app store, search "MyChart", open the app, select [facility], and log in with your MyChart username and password.  Paclitaxel Injection What is this medication? PACLITAXEL (PAK li TAX el) treats some types of cancer. It works by slowing down the growth of cancer cells. This medicine may be used for other purposes; ask your health care provider or pharmacist if you have questions. COMMON BRAND NAME(S): Onxol, Taxol What should I tell my care team before I take this medication? They need to know if you have any of these conditions: Heart disease Liver disease Low white blood cell levels An unusual or allergic reaction to paclitaxel, other medications, foods, dyes, or preservatives If you or your partner are pregnant or trying to get  pregnant Breast-feeding How should I use this medication? This medication is injected into a vein. It is given by your care team in a hospital or clinic setting. Talk to your care team about the use of this medication in children. While it may be given to children for selected conditions, precautions do  apply. Overdosage: If you think you have taken too much of this medicine contact a poison control center or emergency room at once. NOTE: This medicine is only for you. Do not share this medicine with others. What if I miss a dose? Keep appointments for follow-up doses. It is important not to miss your dose. Call your care team if you are unable to keep an appointment. What may interact with this medication? Do not take this medication with any of the following: Live virus vaccines Other medications may affect the way this medication works. Talk with your care team about all of the medications you take. They may suggest changes to your treatment plan to lower the risk of side effects and to make sure your medications work as intended. This list may not describe all possible interactions. Give your health care provider a list of all the medicines, herbs, non-prescription drugs, or dietary supplements you use. Also tell them if you smoke, drink alcohol, or use illegal drugs. Some items may interact with your medicine. What should I watch for while using this medication? Your condition will be monitored carefully while you are receiving this medication. You may need blood work while taking this medication. This medication may make you feel generally unwell. This is not uncommon as chemotherapy can affect healthy cells as well as cancer cells. Report any side effects. Continue your course of treatment even though you feel ill unless your care team tells you to stop. This medication can cause serious allergic reactions. To reduce the risk, your care team may give you other medications to take before receiving this one. Be sure to follow the directions from your care team. This medication may increase your risk of getting an infection. Call your care team for advice if you get a fever, chills, sore throat, or other symptoms of a cold or flu. Do not treat yourself. Try to avoid being around people who are  sick. This medication may increase your risk to bruise or bleed. Call your care team if you notice any unusual bleeding. Be careful brushing or flossing your teeth or using a toothpick because you may get an infection or bleed more easily. If you have any dental work done, tell your dentist you are receiving this medication. Talk to your care team if you may be pregnant. Serious birth defects can occur if you take this medication during pregnancy. Talk to your care team before breastfeeding. Changes to your treatment plan may be needed. What side effects may I notice from receiving this medication? Side effects that you should report to your care team as soon as possible: Allergic reactions--skin rash, itching, hives, swelling of the face, lips, tongue, or throat Heart rhythm changes--fast or irregular heartbeat, dizziness, feeling faint or lightheaded, chest pain, trouble breathing Increase in blood pressure Infection--fever, chills, cough, sore throat, wounds that don't heal, pain or trouble when passing urine, general feeling of discomfort or being unwell Low blood pressure--dizziness, feeling faint or lightheaded, blurry vision Low red blood cell level--unusual weakness or fatigue, dizziness, headache, trouble breathing Painful swelling, warmth, or redness of the skin, blisters or sores at the infusion site Pain, tingling, or numbness in  the hands or feet Slow heartbeat--dizziness, feeling faint or lightheaded, confusion, trouble breathing, unusual weakness or fatigue Unusual bruising or bleeding Side effects that usually do not require medical attention (report to your care team if they continue or are bothersome): Diarrhea Hair loss Joint pain Loss of appetite Muscle pain Nausea Vomiting This list may not describe all possible side effects. Call your doctor for medical advice about side effects. You may report side effects to FDA at 1-800-FDA-1088. Where should I keep my  medication? This medication is given in a hospital or clinic. It will not be stored at home. NOTE: This sheet is a summary. It may not cover all possible information. If you have questions about this medicine, talk to your doctor, pharmacist, or health care provider.  2023 Elsevier/Gold Standard (2021-12-29 00:00:00)

## 2022-11-02 NOTE — Progress Notes (Signed)
Decrease Taxol based on updated weight entered today. Update future Taxol doses based on new BSA per MD.  Acquanetta Belling, RPH, BCPS, BCOP 11/02/2022 12:53 PM

## 2022-11-03 DIAGNOSIS — Z79891 Long term (current) use of opiate analgesic: Secondary | ICD-10-CM | POA: Diagnosis not present

## 2022-11-03 DIAGNOSIS — Z9012 Acquired absence of left breast and nipple: Secondary | ICD-10-CM | POA: Diagnosis not present

## 2022-11-03 DIAGNOSIS — G893 Neoplasm related pain (acute) (chronic): Secondary | ICD-10-CM | POA: Diagnosis not present

## 2022-11-03 DIAGNOSIS — F32A Depression, unspecified: Secondary | ICD-10-CM | POA: Diagnosis not present

## 2022-11-03 DIAGNOSIS — E876 Hypokalemia: Secondary | ICD-10-CM | POA: Diagnosis not present

## 2022-11-03 DIAGNOSIS — C7951 Secondary malignant neoplasm of bone: Secondary | ICD-10-CM | POA: Diagnosis not present

## 2022-11-03 DIAGNOSIS — C50312 Malignant neoplasm of lower-inner quadrant of left female breast: Secondary | ICD-10-CM | POA: Diagnosis not present

## 2022-11-03 DIAGNOSIS — Z170 Estrogen receptor positive status [ER+]: Secondary | ICD-10-CM | POA: Diagnosis not present

## 2022-11-03 DIAGNOSIS — Z981 Arthrodesis status: Secondary | ICD-10-CM | POA: Diagnosis not present

## 2022-11-03 DIAGNOSIS — E43 Unspecified severe protein-calorie malnutrition: Secondary | ICD-10-CM | POA: Diagnosis not present

## 2022-11-03 DIAGNOSIS — F419 Anxiety disorder, unspecified: Secondary | ICD-10-CM | POA: Diagnosis not present

## 2022-11-03 DIAGNOSIS — M542 Cervicalgia: Secondary | ICD-10-CM | POA: Diagnosis not present

## 2022-11-03 DIAGNOSIS — Z87891 Personal history of nicotine dependence: Secondary | ICD-10-CM | POA: Diagnosis not present

## 2022-11-03 DIAGNOSIS — E039 Hypothyroidism, unspecified: Secondary | ICD-10-CM | POA: Diagnosis not present

## 2022-11-03 DIAGNOSIS — Z7901 Long term (current) use of anticoagulants: Secondary | ICD-10-CM | POA: Diagnosis not present

## 2022-11-06 DIAGNOSIS — M542 Cervicalgia: Secondary | ICD-10-CM | POA: Diagnosis not present

## 2022-11-06 DIAGNOSIS — E876 Hypokalemia: Secondary | ICD-10-CM | POA: Diagnosis not present

## 2022-11-06 DIAGNOSIS — Z7901 Long term (current) use of anticoagulants: Secondary | ICD-10-CM | POA: Diagnosis not present

## 2022-11-06 DIAGNOSIS — G893 Neoplasm related pain (acute) (chronic): Secondary | ICD-10-CM | POA: Diagnosis not present

## 2022-11-06 DIAGNOSIS — Z170 Estrogen receptor positive status [ER+]: Secondary | ICD-10-CM | POA: Diagnosis not present

## 2022-11-06 DIAGNOSIS — E039 Hypothyroidism, unspecified: Secondary | ICD-10-CM | POA: Diagnosis not present

## 2022-11-06 DIAGNOSIS — F32A Depression, unspecified: Secondary | ICD-10-CM | POA: Diagnosis not present

## 2022-11-06 DIAGNOSIS — Z79891 Long term (current) use of opiate analgesic: Secondary | ICD-10-CM | POA: Diagnosis not present

## 2022-11-06 DIAGNOSIS — Z87891 Personal history of nicotine dependence: Secondary | ICD-10-CM | POA: Diagnosis not present

## 2022-11-06 DIAGNOSIS — E43 Unspecified severe protein-calorie malnutrition: Secondary | ICD-10-CM | POA: Diagnosis not present

## 2022-11-06 DIAGNOSIS — C50312 Malignant neoplasm of lower-inner quadrant of left female breast: Secondary | ICD-10-CM | POA: Diagnosis not present

## 2022-11-06 DIAGNOSIS — Z981 Arthrodesis status: Secondary | ICD-10-CM | POA: Diagnosis not present

## 2022-11-06 DIAGNOSIS — C7951 Secondary malignant neoplasm of bone: Secondary | ICD-10-CM | POA: Diagnosis not present

## 2022-11-06 DIAGNOSIS — F419 Anxiety disorder, unspecified: Secondary | ICD-10-CM | POA: Diagnosis not present

## 2022-11-06 DIAGNOSIS — Z9012 Acquired absence of left breast and nipple: Secondary | ICD-10-CM | POA: Diagnosis not present

## 2022-11-07 ENCOUNTER — Telehealth: Payer: Self-pay | Admitting: Family Medicine

## 2022-11-07 NOTE — Telephone Encounter (Signed)
Caller: Caprice Red  Ph# [phone]  VO given

## 2022-11-07 NOTE — Telephone Encounter (Signed)
Ok with me. Please place any necessary orders. 

## 2022-11-07 NOTE — Telephone Encounter (Signed)
Please advise 

## 2022-11-07 NOTE — Telephone Encounter (Signed)
Home Health Verbal Orders  Agency:  [facility]  Caller: Caprice Red  Contact and title  Ph# [phone]  Requesting OT:  (states evaluated 11/03/22)  Reason for Request:  Pain control, functional mobility, health promotion, exercise, home Safety and ADL  Frequency:  1 x per week for 3 weeks, skip 1 week, 1 x per week for 3 weeks   HH needs F2F w/in last 30 days

## 2022-11-08 ENCOUNTER — Inpatient Hospital Stay
Admit: 2022-11-08 | Discharge: 2022-11-08 | Disposition: A | Discharge status: Home / Self Care | Payer: BC Managed Care – PPO | Attending: Radiation Oncology | Admitting: Radiation Oncology

## 2022-11-08 DIAGNOSIS — Z7901 Long term (current) use of anticoagulants: Secondary | ICD-10-CM | POA: Diagnosis not present

## 2022-11-08 DIAGNOSIS — M542 Cervicalgia: Secondary | ICD-10-CM | POA: Diagnosis not present

## 2022-11-08 DIAGNOSIS — Z79891 Long term (current) use of opiate analgesic: Secondary | ICD-10-CM | POA: Diagnosis not present

## 2022-11-08 DIAGNOSIS — C50312 Malignant neoplasm of lower-inner quadrant of left female breast: Secondary | ICD-10-CM | POA: Diagnosis not present

## 2022-11-08 DIAGNOSIS — G893 Neoplasm related pain (acute) (chronic): Secondary | ICD-10-CM | POA: Diagnosis not present

## 2022-11-08 DIAGNOSIS — E876 Hypokalemia: Secondary | ICD-10-CM | POA: Diagnosis not present

## 2022-11-08 DIAGNOSIS — F419 Anxiety disorder, unspecified: Secondary | ICD-10-CM | POA: Diagnosis not present

## 2022-11-08 DIAGNOSIS — E43 Unspecified severe protein-calorie malnutrition: Secondary | ICD-10-CM | POA: Diagnosis not present

## 2022-11-08 DIAGNOSIS — Z9012 Acquired absence of left breast and nipple: Secondary | ICD-10-CM | POA: Diagnosis not present

## 2022-11-08 DIAGNOSIS — E039 Hypothyroidism, unspecified: Secondary | ICD-10-CM | POA: Diagnosis not present

## 2022-11-08 DIAGNOSIS — C7951 Secondary malignant neoplasm of bone: Secondary | ICD-10-CM | POA: Diagnosis not present

## 2022-11-08 DIAGNOSIS — F32A Depression, unspecified: Secondary | ICD-10-CM | POA: Diagnosis not present

## 2022-11-08 DIAGNOSIS — Z981 Arthrodesis status: Secondary | ICD-10-CM | POA: Diagnosis not present

## 2022-11-08 DIAGNOSIS — Z87891 Personal history of nicotine dependence: Secondary | ICD-10-CM | POA: Diagnosis not present

## 2022-11-08 DIAGNOSIS — Z170 Estrogen receptor positive status [ER+]: Secondary | ICD-10-CM | POA: Diagnosis not present

## 2022-11-08 MED FILL — Dexamethasone Sodium Phosphate Inj 100 MG/10ML: INTRAMUSCULAR | Qty: 1 | Status: AC

## 2022-11-09 ENCOUNTER — Inpatient Hospital Stay: Payer: BC Managed Care – PPO

## 2022-11-09 ENCOUNTER — Ambulatory Visit: Payer: BC Managed Care – PPO | Admitting: Hematology

## 2022-11-09 ENCOUNTER — Other Ambulatory Visit: Payer: Self-pay

## 2022-11-09 VITALS — BP 122/82 | HR 99 | Resp 18 | Wt 75.0 lb

## 2022-11-09 DIAGNOSIS — Z79899 Other long term (current) drug therapy: Secondary | ICD-10-CM | POA: Diagnosis not present

## 2022-11-09 DIAGNOSIS — Z5112 Encounter for antineoplastic immunotherapy: Secondary | ICD-10-CM | POA: Diagnosis not present

## 2022-11-09 DIAGNOSIS — Z923 Personal history of irradiation: Secondary | ICD-10-CM | POA: Diagnosis not present

## 2022-11-09 DIAGNOSIS — C7931 Secondary malignant neoplasm of brain: Secondary | ICD-10-CM | POA: Diagnosis not present

## 2022-11-09 DIAGNOSIS — C50312 Malignant neoplasm of lower-inner quadrant of left female breast: Secondary | ICD-10-CM | POA: Diagnosis not present

## 2022-11-09 DIAGNOSIS — C7951 Secondary malignant neoplasm of bone: Secondary | ICD-10-CM | POA: Diagnosis not present

## 2022-11-09 DIAGNOSIS — Z79890 Hormone replacement therapy (postmenopausal): Secondary | ICD-10-CM | POA: Diagnosis not present

## 2022-11-09 DIAGNOSIS — Z95828 Presence of other vascular implants and grafts: Secondary | ICD-10-CM

## 2022-11-09 DIAGNOSIS — G893 Neoplasm related pain (acute) (chronic): Secondary | ICD-10-CM | POA: Diagnosis not present

## 2022-11-09 DIAGNOSIS — M47816 Spondylosis without myelopathy or radiculopathy, lumbar region: Secondary | ICD-10-CM | POA: Diagnosis not present

## 2022-11-09 DIAGNOSIS — Z170 Estrogen receptor positive status [ER+]: Secondary | ICD-10-CM | POA: Diagnosis not present

## 2022-11-09 LAB — CBC WITH DIFFERENTIAL (CANCER CENTER ONLY)
Abs Immature Granulocytes: 0.18 10*3/uL — ABNORMAL HIGH (ref 0.00–0.07)
Basophils Absolute: 0 10*3/uL (ref 0.0–0.1)
Basophils Relative: 1 %
Eosinophils Absolute: 0 10*3/uL (ref 0.0–0.5)
Eosinophils Relative: 1 %
HCT: 32.2 % — ABNORMAL LOW (ref 36.0–46.0)
Hemoglobin: 11.4 g/dL — ABNORMAL LOW (ref 12.0–15.0)
Immature Granulocytes: 6 %
Lymphocytes Relative: 28 %
Lymphs Abs: 0.8 10*3/uL (ref 0.7–4.0)
MCH: 32.8 pg (ref 26.0–34.0)
MCHC: 35.4 g/dL (ref 30.0–36.0)
MCV: 92.5 fL (ref 80.0–100.0)
Monocytes Absolute: 0.4 10*3/uL (ref 0.1–1.0)
Monocytes Relative: 12 %
Neutro Abs: 1.6 10*3/uL — ABNORMAL LOW (ref 1.7–7.7)
Neutrophils Relative %: 52 %
Platelet Count: 286 10*3/uL (ref 150–400)
RBC: 3.48 MIL/uL — ABNORMAL LOW (ref 3.87–5.11)
RDW: 17.5 % — ABNORMAL HIGH (ref 11.5–15.5)
Smear Review: NORMAL
WBC Count: 3 10*3/uL — ABNORMAL LOW (ref 4.0–10.5)
nRBC: 1 % — ABNORMAL HIGH (ref 0.0–0.2)

## 2022-11-09 LAB — CMP (CANCER CENTER ONLY)
ALT: 30 U/L (ref 0–44)
AST: 34 U/L (ref 15–41)
Albumin: 3.9 g/dL (ref 3.5–5.0)
Alkaline Phosphatase: 2373 U/L — ABNORMAL HIGH (ref 38–126)
Anion gap: 5 (ref 5–15)
BUN: 12 mg/dL (ref 8–23)
CO2: 30 mmol/L (ref 22–32)
Calcium: 8.7 mg/dL — ABNORMAL LOW (ref 8.9–10.3)
Chloride: 100 mmol/L (ref 98–111)
Creatinine: <0.3 mg/dL — ABNORMAL LOW (ref 0.44–1.00)
Glucose, Bld: 143 mg/dL — ABNORMAL HIGH (ref 70–99)
Potassium: 4 mmol/L (ref 3.5–5.1)
Sodium: 135 mmol/L (ref 135–145)
Total Bilirubin: 0.8 mg/dL (ref 0.3–1.2)
Total Protein: 6.3 g/dL — ABNORMAL LOW (ref 6.5–8.1)

## 2022-11-09 MED ORDER — SODIUM CHLORIDE 0.9 % IV SOLN
80.0000 mg/m2 | Freq: Once | INTRAVENOUS | Status: AC
  Administered 2022-11-09: 102 mg via INTRAVENOUS
  Filled 2022-11-09: qty 17

## 2022-11-09 MED ORDER — HEPARIN SOD (PORK) LOCK FLUSH 100 UNIT/ML IV SOLN: 500.0000 [IU] | Freq: Once | INTRAVENOUS | Status: DC | PRN

## 2022-11-09 MED ORDER — SODIUM CHLORIDE 0.9% FLUSH
10.0000 mL | Freq: Once | INTRAVENOUS | Status: AC
  Administered 2022-11-09: 10 mL

## 2022-11-09 MED ORDER — SODIUM CHLORIDE 0.9% FLUSH: 10.0000 mL | INTRAVENOUS | Status: DC | PRN

## 2022-11-09 MED ORDER — SODIUM CHLORIDE 0.9 % IV SOLN: Freq: Once | INTRAVENOUS | Status: AC

## 2022-11-09 MED ORDER — DIPHENHYDRAMINE HCL 50 MG/ML IJ SOLN
50.0000 mg | Freq: Once | INTRAMUSCULAR | Status: AC
  Administered 2022-11-09: 50 mg via INTRAVENOUS
  Filled 2022-11-09: qty 1

## 2022-11-09 MED ORDER — SODIUM CHLORIDE 0.9 % IV SOLN
10.0000 mg | Freq: Once | INTRAVENOUS | Status: AC
  Administered 2022-11-09: 10 mg via INTRAVENOUS
  Filled 2022-11-09: qty 10

## 2022-11-09 MED ORDER — FAMOTIDINE IN NACL 20-0.9 MG/50ML-% IV SOLN
20.0000 mg | Freq: Once | INTRAVENOUS | Status: AC
  Administered 2022-11-09: 20 mg via INTRAVENOUS
  Filled 2022-11-09: qty 50

## 2022-11-09 NOTE — Patient Instructions (Signed)
[facility]  Discharge Instructions: Thank you for choosing [facility] to provide your oncology and hematology care.   If you have a lab appointment with the Cancer Center, please go directly to the Cancer Center and check in at the registration area.   Wear comfortable clothing and clothing appropriate for easy access to any Portacath or PICC line.   We strive to give you quality time with your provider. You may need to reschedule your appointment if you arrive late (15 or more minutes).  Arriving late affects you and other patients whose appointments are after yours.  Also, if you miss three or more appointments without notifying the office, you may be dismissed from the clinic at the provider's discretion.      For prescription refill requests, have your pharmacy contact our office and allow 72 hours for refills to be completed.    Today you received the following chemotherapy and/or immunotherapy agents Paclitaxel      To help prevent nausea and vomiting after your treatment, we encourage you to take your nausea medication as directed.  BELOW ARE SYMPTOMS THAT SHOULD BE REPORTED IMMEDIATELY: *FEVER GREATER THAN 100.4 F (38 C) OR HIGHER *CHILLS OR SWEATING *NAUSEA AND VOMITING THAT IS NOT CONTROLLED WITH YOUR NAUSEA MEDICATION *UNUSUAL SHORTNESS OF BREATH *UNUSUAL BRUISING OR BLEEDING *URINARY PROBLEMS (pain or burning when urinating, or frequent urination) *BOWEL PROBLEMS (unusual diarrhea, constipation, pain near the anus) TENDERNESS IN MOUTH AND THROAT WITH OR WITHOUT PRESENCE OF ULCERS (sore throat, sores in mouth, or a toothache) UNUSUAL RASH, SWELLING OR PAIN  UNUSUAL VAGINAL DISCHARGE OR ITCHING   Items with * indicate a potential emergency and should be followed up as soon as possible or go to the Emergency Department if any problems should occur.  Please show the CHEMOTHERAPY ALERT CARD or IMMUNOTHERAPY ALERT CARD at  check-in to the Emergency Department and triage nurse.  Should you have questions after your visit or need to cancel or reschedule your appointment, please contact [facility]  Dept: [phone]  and follow the prompts.  Office hours are 8:00 a.m. to 4:30 p.m. Monday - Friday. Please note that voicemails left after 4:00 p.m. may not be returned until the following business day.  We are closed weekends and major holidays. You have access to a nurse at all times for urgent questions. Please call the main number to the clinic Dept: [phone] and follow the prompts.   For any non-urgent questions, you may also contact your provider using MyChart. We now offer e-Visits for anyone 18 and older to request care online for non-urgent symptoms. For details visit [email or web address].   Also download the MyChart app! Go to the app store, search "MyChart", open the app, select [facility], and log in with your MyChart username and password.   

## 2022-11-10 DIAGNOSIS — E039 Hypothyroidism, unspecified: Secondary | ICD-10-CM | POA: Diagnosis not present

## 2022-11-10 DIAGNOSIS — E43 Unspecified severe protein-calorie malnutrition: Secondary | ICD-10-CM | POA: Diagnosis not present

## 2022-11-10 DIAGNOSIS — C50312 Malignant neoplasm of lower-inner quadrant of left female breast: Secondary | ICD-10-CM | POA: Diagnosis not present

## 2022-11-10 DIAGNOSIS — G893 Neoplasm related pain (acute) (chronic): Secondary | ICD-10-CM | POA: Diagnosis not present

## 2022-11-10 DIAGNOSIS — C7951 Secondary malignant neoplasm of bone: Secondary | ICD-10-CM | POA: Diagnosis not present

## 2022-11-10 DIAGNOSIS — Z7901 Long term (current) use of anticoagulants: Secondary | ICD-10-CM | POA: Diagnosis not present

## 2022-11-10 DIAGNOSIS — Z170 Estrogen receptor positive status [ER+]: Secondary | ICD-10-CM | POA: Diagnosis not present

## 2022-11-10 DIAGNOSIS — Z981 Arthrodesis status: Secondary | ICD-10-CM | POA: Diagnosis not present

## 2022-11-10 DIAGNOSIS — Z79891 Long term (current) use of opiate analgesic: Secondary | ICD-10-CM | POA: Diagnosis not present

## 2022-11-10 DIAGNOSIS — Z9012 Acquired absence of left breast and nipple: Secondary | ICD-10-CM | POA: Diagnosis not present

## 2022-11-10 DIAGNOSIS — M542 Cervicalgia: Secondary | ICD-10-CM | POA: Diagnosis not present

## 2022-11-10 DIAGNOSIS — F32A Depression, unspecified: Secondary | ICD-10-CM | POA: Diagnosis not present

## 2022-11-10 DIAGNOSIS — E876 Hypokalemia: Secondary | ICD-10-CM | POA: Diagnosis not present

## 2022-11-10 DIAGNOSIS — F419 Anxiety disorder, unspecified: Secondary | ICD-10-CM | POA: Diagnosis not present

## 2022-11-10 DIAGNOSIS — Z87891 Personal history of nicotine dependence: Secondary | ICD-10-CM | POA: Diagnosis not present

## 2022-11-10 NOTE — Progress Notes (Deleted)
[place]  [phone] [phone]   Name: Martha [name] Date: 11/10/2022 MRN: [id number]  DOB: 09/23/1960  Patient Care Team: [name], MD as PCP - General (Family Medicine) [name], MD as Consulting Physician (General Surgery) Truitt Merle, MD as Consulting Physician (Hematology) Eppie Gibson, MD as Attending Physician (Radiation Oncology) Alla Feeling, NP as Nurse Practitioner (Nurse Practitioner)    INTERVAL HISTORY: Martha [name] is a 62 y.o. female with oncologic medical history including ER+ breast cancer (01/2018), right breast cancer (04/2021), now with metastatic disease progression involving brain and liver, pathological fractures with osseous mets s/p brain radiation. .  Palliative ask to see for symptom management and goals of care.   SOCIAL HISTORY:     reports that she has been smoking cigarettes. She has a 20.00 pack-year smoking history. She has never used smokeless tobacco. She reports that she does not currently use alcohol after a past usage of about 28.0 standard drinks of alcohol per week. She reports that she does not use drugs.  ADVANCE DIRECTIVES:  Has documents on file  CODE STATUS: DNR  PAST MEDICAL HISTORY: Past Medical History:  Diagnosis Date   Anxiety    Cancer ([place]) 2022   right breast LCIS   Cancer ([place]) 2019   left breast   Depression    Dysrhythmia    SVT, s/p ablation ~ 2012 in at [facility]   Ectopic pregnancy    Family history of breast cancer    Family history of melanoma    History of radiation therapy 04/22/18-06/03/18   Left Breast, left SCV, axilla 50 Gy in 25 fractions, Left breast boost 10 Gy in 5 fractions.    Hypothyroidism    Personal history of radiation therapy    PONV (postoperative nausea and vomiting)    Thyroid disease     ALLERGIES:  is allergic to morphine and related.  MEDICATIONS:  Current Outpatient  Medications  Medication Sig Dispense Refill   acetaminophen (TYLENOL) 500 MG tablet Take 2 tablets (1,000 mg total) by mouth every 8 (eight) hours. 30 tablet 0   citalopram (CELEXA) 40 MG tablet Take 1 tablet (40 mg total) by mouth daily. 30 tablet 3   levothyroxine (SYNTHROID) 75 MCG tablet Take 1 tablet (75 mcg total) by mouth daily before breakfast. 30 tablet 0   lidocaine (XYLOCAINE) 2 % solution Patient: Mix 1part 2% viscous lidocaine, 1part H20. Swallow 101mL of diluted mixture, 3min before meals and at bedtime, up to QID 100 mL 3   lidocaine-prilocaine (EMLA) cream Apply to affected area once (Patient taking differently: Apply 1 Application topically daily as needed (burning).) 30 g 3   LORazepam (ATIVAN) 0.5 MG tablet Take 1 tablet (0.5 mg total) by mouth every 6 (six) hours as needed for anxiety (agitation). 30 tablet 0   Mouthwashes (MOUTH RINSE) LIQD solution 15 mLs by Mouth Rinse route as needed (for oral care).  0   ondansetron (ZOFRAN) 8 MG tablet Take 1 tablet (8 mg total) by mouth every 8 (eight) hours as needed for nausea or vomiting. 20 tablet 1   OVER THE COUNTER MEDICATION Take 1 tablet by mouth daily. Protandim Supplement     oxyCODONE (OXY IR/ROXICODONE) 5 MG immediate release tablet Take 0.5-1 tablets (2.5-5 mg total) by mouth every 4 (four) hours as needed for moderate pain or severe pain (dyspnea). 30 tablet 0   pregabalin (LYRICA) 25 MG capsule Take 1  capsule (25 mg total) by mouth 2 (two) times daily. 60 capsule 2   senna-docusate (SENOKOT-S) 8.6-50 MG tablet Take 1 tablet by mouth 2 (two) times daily. 60 tablet 0   No current facility-administered medications for this visit.    VITAL SIGNS: There were no vitals taken for this visit. There were no vitals filed for this visit.  Estimated body mass index is 13.29 kg/m as calculated from the following:   Height as of 11/02/22: 5\' 3"  (1.6 m).   Weight as of 11/09/22: 75 lb (34 kg).   PERFORMANCE STATUS (ECOG) : 3 -  Symptomatic, >50% confined to bed   Physical Exam General: NAD, cachectic, in wheelchair  Cardiovascular: regular rate and rhythm Pulmonary: clear ant fields Abdomen: soft, nontender, + bowel sounds Extremities: no edema, no joint deformities Skin: no rashes, muscle wasting  Neurological: AAO x5   IMPRESSION:   Neoplasm related pain Mrs. Armistead reports pain has improved. Physical therapy also has come out to the home and scheduled to come back in the next few days for ongoing support. Feels the increase in her Lyrica (adding night time dose) has significantly allowed for some of her improvement. Does continue to have pain and discomfort with movement however feels it is manageable.    We discussed her current pain regimen.  She is taking 2.5-5 mg of oxycodone IR as needed for pain.  Also taking Lyrica 25 mg twice daily. Tolerating evening dose without difficulty. Given sensitivity to recent pain medication changes over the past several weeks patient is reluctant to make any significant adjustments to her medications out of fear of confusion and hallucinations.    We will continue to closely monitor and adjust as needed.   Decreased appetite/weight loss Appetite has improved. Drinking 2-3 protein drinks daily in addition to other nutrition. Weight is 76lbs.    Goals of care  10/26/22-We discussed her current illness and what it means in the larger context of Her on-going co-morbidities. Natural disease trajectory and expectations were discussed.   [name] and her husband are realistic in their understanding.  They are clear on expressed wishes to continue to treat the treatable allow her every opportunity to continue to thrive.  She acknowledges at some point her health will continue to decline requiring her to focus more on her comfort and foregoing treatments.  She asked appropriate questions of appropriateness of discontinuing treatments.  She is not quite ready to make these decisions  and would like to take things one day at a time.   Extensive education provided on the differences between palliative and hospice services.  We discussed what both entities would look like in the home/outpatient.  Patient and husband verbalized understanding and appreciation.   I empathetically approach discussions regarding advanced directives and code status.  Patient has a documented advanced directive that was recently completed.  Her husband [name] is her primary medical decision-maker and her Sister Threasa Beards is secondary. Patient confirms wishes for DNR/DNI.  Out of facility form has been completed.    Education provided on the MOLST form.  As requested form was completed today.  We discussed Her current illness and what it means in the larger context of Her on-going co-morbidities. Natural disease trajectory and expectations were discussed.  I discussed the importance of continued conversation with family and their medical providers regarding overall plan of care and treatment options, ensuring decisions are within the context of the patients values and GOCs.  PLAN: DNR/DNI/MOST form on file  Continue oxycodone as needed for pain Lyrica 25 mg twice daily (tolerating) Ongoing goals of care discussions and symptom management I will plan to see patient back in 2-4 weeks in collaboration to other oncology appointments.  Patient and husband knows to contact office sooner if needed.   Patient expressed understanding and was in agreement with this plan. She also understands that She can call the clinic at any time with any questions, concerns, or complaints.   Any controlled substances utilized were prescribed in the context of palliative care. PDMP has been reviewed.   Time Total: 25 min   Visit consisted of counseling and education dealing with the complex and emotionally intense issues of symptom management and palliative care in the setting of serious and potentially life-threatening  illness.Greater than 50%  of this time was spent counseling and coordinating care related to the above assessment and plan.  Alda Lea, AGPCNP-BC  Palliative Medicine Team/[place] [facility]  *Please note that this is a verbal dictation therefore any spelling or grammatical errors are due to the "[place] [employer]" system interpretation.

## 2022-11-13 DIAGNOSIS — C50312 Malignant neoplasm of lower-inner quadrant of left female breast: Secondary | ICD-10-CM | POA: Diagnosis not present

## 2022-11-13 DIAGNOSIS — Z7901 Long term (current) use of anticoagulants: Secondary | ICD-10-CM | POA: Diagnosis not present

## 2022-11-13 DIAGNOSIS — E876 Hypokalemia: Secondary | ICD-10-CM | POA: Diagnosis not present

## 2022-11-13 DIAGNOSIS — Z981 Arthrodesis status: Secondary | ICD-10-CM | POA: Diagnosis not present

## 2022-11-13 DIAGNOSIS — Z87891 Personal history of nicotine dependence: Secondary | ICD-10-CM | POA: Diagnosis not present

## 2022-11-13 DIAGNOSIS — E43 Unspecified severe protein-calorie malnutrition: Secondary | ICD-10-CM | POA: Diagnosis not present

## 2022-11-13 DIAGNOSIS — M542 Cervicalgia: Secondary | ICD-10-CM | POA: Diagnosis not present

## 2022-11-13 DIAGNOSIS — F32A Depression, unspecified: Secondary | ICD-10-CM | POA: Diagnosis not present

## 2022-11-13 DIAGNOSIS — E039 Hypothyroidism, unspecified: Secondary | ICD-10-CM | POA: Diagnosis not present

## 2022-11-13 DIAGNOSIS — Z9012 Acquired absence of left breast and nipple: Secondary | ICD-10-CM | POA: Diagnosis not present

## 2022-11-13 DIAGNOSIS — F419 Anxiety disorder, unspecified: Secondary | ICD-10-CM | POA: Diagnosis not present

## 2022-11-13 DIAGNOSIS — G893 Neoplasm related pain (acute) (chronic): Secondary | ICD-10-CM | POA: Diagnosis not present

## 2022-11-13 DIAGNOSIS — Z170 Estrogen receptor positive status [ER+]: Secondary | ICD-10-CM | POA: Diagnosis not present

## 2022-11-13 DIAGNOSIS — Z79891 Long term (current) use of opiate analgesic: Secondary | ICD-10-CM | POA: Diagnosis not present

## 2022-11-13 DIAGNOSIS — C7951 Secondary malignant neoplasm of bone: Secondary | ICD-10-CM | POA: Diagnosis not present

## 2022-11-14 ENCOUNTER — Telehealth: Payer: Self-pay | Admitting: Family Medicine

## 2022-11-15 ENCOUNTER — Telehealth: Payer: Self-pay | Admitting: Family Medicine

## 2022-11-15 ENCOUNTER — Inpatient Hospital Stay
Admission: RE | Admit: 2022-11-15 | Discharge: 2022-11-15 | Disposition: A | Discharge status: Home / Self Care | Payer: Self-pay | Source: Ambulatory Visit | Attending: Radiation Oncology | Admitting: Radiation Oncology

## 2022-11-15 DIAGNOSIS — C50312 Malignant neoplasm of lower-inner quadrant of left female breast: Secondary | ICD-10-CM | POA: Diagnosis not present

## 2022-11-15 DIAGNOSIS — Z170 Estrogen receptor positive status [ER+]: Secondary | ICD-10-CM | POA: Diagnosis not present

## 2022-11-15 DIAGNOSIS — E43 Unspecified severe protein-calorie malnutrition: Secondary | ICD-10-CM | POA: Diagnosis not present

## 2022-11-15 DIAGNOSIS — M542 Cervicalgia: Secondary | ICD-10-CM | POA: Diagnosis not present

## 2022-11-15 DIAGNOSIS — G893 Neoplasm related pain (acute) (chronic): Secondary | ICD-10-CM | POA: Diagnosis not present

## 2022-11-15 DIAGNOSIS — Z7901 Long term (current) use of anticoagulants: Secondary | ICD-10-CM | POA: Diagnosis not present

## 2022-11-15 DIAGNOSIS — E876 Hypokalemia: Secondary | ICD-10-CM | POA: Diagnosis not present

## 2022-11-15 DIAGNOSIS — F419 Anxiety disorder, unspecified: Secondary | ICD-10-CM | POA: Diagnosis not present

## 2022-11-15 DIAGNOSIS — F32A Depression, unspecified: Secondary | ICD-10-CM | POA: Diagnosis not present

## 2022-11-15 DIAGNOSIS — C7951 Secondary malignant neoplasm of bone: Secondary | ICD-10-CM | POA: Diagnosis not present

## 2022-11-15 DIAGNOSIS — Z9012 Acquired absence of left breast and nipple: Secondary | ICD-10-CM | POA: Diagnosis not present

## 2022-11-15 DIAGNOSIS — E039 Hypothyroidism, unspecified: Secondary | ICD-10-CM | POA: Diagnosis not present

## 2022-11-15 DIAGNOSIS — Z87891 Personal history of nicotine dependence: Secondary | ICD-10-CM | POA: Diagnosis not present

## 2022-11-15 DIAGNOSIS — Z79891 Long term (current) use of opiate analgesic: Secondary | ICD-10-CM | POA: Diagnosis not present

## 2022-11-15 DIAGNOSIS — Z981 Arthrodesis status: Secondary | ICD-10-CM | POA: Diagnosis not present

## 2022-11-15 NOTE — Telephone Encounter (Signed)
Place on PCP desk to be reviewed

## 2022-11-15 NOTE — Telephone Encounter (Signed)
..  Home Health Certification or Plan of Care Tracking  Is this a Certification or Plan of Care? poc  [facility] Agency:[facility]  Order Number:  [id number]  Has charge sheet been attached?yes   Where has form been placed:   Dr Ellwood Handler folder  Faxed to:   [phone]

## 2022-11-16 ENCOUNTER — Encounter: Payer: Self-pay | Admitting: Hematology

## 2022-11-16 MED FILL — Dexamethasone Sodium Phosphate Inj 100 MG/10ML: INTRAMUSCULAR | Qty: 1 | Status: AC

## 2022-11-16 NOTE — Telephone Encounter (Signed)
Form Faxed to [phone]

## 2022-11-16 NOTE — Assessment & Plan Note (Signed)
-  She is on low-dose oxycodone, she has developed a significant pain in the right rib cage, probably related to her bone metastasis  -f/u with palliative care clinic NP Nikki  

## 2022-11-16 NOTE — Assessment & Plan Note (Signed)
-  Diagnosed in 06/2022 --PET scan from 08/03/2022 showed diffuse bone mets  -she underwent cervical laminectomy and fusion by Dr. Marcello Moores on 08/17/2022, biopsy confirmed metastatic breast cancer, ER/PR negative, HER2 positive. -due to cervical cord compression with myelopathy, she underwent a second cervical spine surgery on September 06, 2022, and completed inpatient rehabitation. -She has completed brain, cervical and lumbar spine radiation in Feb 2024 -She was recently hospitalized for altered mental status and confusion, probably secondary to medication -She started first line systemic chemotherapy with weekly Taxol and trastuzumab/Perjeta on 10/25/22. She has tolerated well so far and her pain has improved overall

## 2022-11-16 NOTE — Assessment & Plan Note (Signed)
--  She was diagnosed in 12/2017. She is s/p left breast lumpectomy and adjuvant radiation.  -She started anti-estrogen therapy with letrozole on 05/2018. Tolerating well with no issues -lost f/u after visit in 04/2020 until her recurrence in 07/2022  

## 2022-11-17 ENCOUNTER — Inpatient Hospital Stay: Payer: BC Managed Care – PPO | Admitting: Dietician

## 2022-11-17 ENCOUNTER — Ambulatory Visit (HOSPITAL_COMMUNITY)
Admission: RE | Admit: 2022-11-17 | Discharge: 2022-11-17 | Disposition: A | Discharge status: Home / Self Care | Payer: BC Managed Care – PPO | Source: Ambulatory Visit | Attending: Hematology | Admitting: Hematology

## 2022-11-17 ENCOUNTER — Ambulatory Visit
Admission: RE | Admit: 2022-11-17 | Discharge: 2022-11-17 | Disposition: A | Discharge status: Home / Self Care | Payer: BC Managed Care – PPO | Source: Ambulatory Visit | Attending: Radiation Oncology | Admitting: Radiation Oncology

## 2022-11-17 ENCOUNTER — Inpatient Hospital Stay: Payer: BC Managed Care – PPO

## 2022-11-17 ENCOUNTER — Inpatient Hospital Stay (HOSPITAL_BASED_OUTPATIENT_CLINIC_OR_DEPARTMENT_OTHER): Payer: BC Managed Care – PPO | Admitting: Hematology

## 2022-11-17 ENCOUNTER — Encounter: Payer: Self-pay | Admitting: Hematology

## 2022-11-17 ENCOUNTER — Other Ambulatory Visit: Payer: Self-pay

## 2022-11-17 VITALS — BP 103/73 | HR 82 | Temp 97.8°F | Resp 16 | Ht 63.0 in | Wt 76.8 lb

## 2022-11-17 VITALS — BP 128/76 | HR 85 | Temp 98.5°F | Resp 16

## 2022-11-17 VITALS — BP 103/73 | HR 82 | Temp 97.9°F | Resp 14 | Ht 63.0 in | Wt 76.5 lb

## 2022-11-17 DIAGNOSIS — C7951 Secondary malignant neoplasm of bone: Secondary | ICD-10-CM

## 2022-11-17 DIAGNOSIS — C50312 Malignant neoplasm of lower-inner quadrant of left female breast: Secondary | ICD-10-CM | POA: Insufficient documentation

## 2022-11-17 DIAGNOSIS — Z79899 Other long term (current) drug therapy: Secondary | ICD-10-CM | POA: Insufficient documentation

## 2022-11-17 DIAGNOSIS — Z170 Estrogen receptor positive status [ER+]: Secondary | ICD-10-CM | POA: Diagnosis not present

## 2022-11-17 DIAGNOSIS — Z923 Personal history of irradiation: Secondary | ICD-10-CM | POA: Insufficient documentation

## 2022-11-17 DIAGNOSIS — Z95828 Presence of other vascular implants and grafts: Secondary | ICD-10-CM

## 2022-11-17 DIAGNOSIS — Z5112 Encounter for antineoplastic immunotherapy: Secondary | ICD-10-CM | POA: Diagnosis not present

## 2022-11-17 DIAGNOSIS — Z79890 Hormone replacement therapy (postmenopausal): Secondary | ICD-10-CM | POA: Insufficient documentation

## 2022-11-17 DIAGNOSIS — C7931 Secondary malignant neoplasm of brain: Secondary | ICD-10-CM

## 2022-11-17 DIAGNOSIS — G893 Neoplasm related pain (acute) (chronic): Secondary | ICD-10-CM

## 2022-11-17 DIAGNOSIS — M47816 Spondylosis without myelopathy or radiculopathy, lumbar region: Secondary | ICD-10-CM | POA: Insufficient documentation

## 2022-11-17 DIAGNOSIS — M545 Low back pain, unspecified: Secondary | ICD-10-CM | POA: Diagnosis not present

## 2022-11-17 LAB — CBC WITH DIFFERENTIAL (CANCER CENTER ONLY)
Abs Immature Granulocytes: 0.19 10*3/uL — ABNORMAL HIGH (ref 0.00–0.07)
Basophils Absolute: 0 10*3/uL (ref 0.0–0.1)
Basophils Relative: 1 %
Eosinophils Absolute: 0 10*3/uL (ref 0.0–0.5)
Eosinophils Relative: 0 %
HCT: 31.1 % — ABNORMAL LOW (ref 36.0–46.0)
Hemoglobin: 10.6 g/dL — ABNORMAL LOW (ref 12.0–15.0)
Immature Granulocytes: 5 %
Lymphocytes Relative: 20 %
Lymphs Abs: 0.8 10*3/uL (ref 0.7–4.0)
MCH: 32.4 pg (ref 26.0–34.0)
MCHC: 34.1 g/dL (ref 30.0–36.0)
MCV: 95.1 fL (ref 80.0–100.0)
Monocytes Absolute: 0.4 10*3/uL (ref 0.1–1.0)
Monocytes Relative: 10 %
Neutro Abs: 2.5 10*3/uL (ref 1.7–7.7)
Neutrophils Relative %: 64 %
Platelet Count: 324 10*3/uL (ref 150–400)
RBC: 3.27 MIL/uL — ABNORMAL LOW (ref 3.87–5.11)
RDW: 19.3 % — ABNORMAL HIGH (ref 11.5–15.5)
WBC Count: 3.9 10*3/uL — ABNORMAL LOW (ref 4.0–10.5)
nRBC: 0.8 % — ABNORMAL HIGH (ref 0.0–0.2)

## 2022-11-17 LAB — CMP (CANCER CENTER ONLY)
ALT: 20 U/L (ref 0–44)
AST: 22 U/L (ref 15–41)
Albumin: 4 g/dL (ref 3.5–5.0)
Alkaline Phosphatase: 1774 U/L — ABNORMAL HIGH (ref 38–126)
Anion gap: 5 (ref 5–15)
BUN: 8 mg/dL (ref 8–23)
CO2: 29 mmol/L (ref 22–32)
Calcium: 8.3 mg/dL — ABNORMAL LOW (ref 8.9–10.3)
Chloride: 101 mmol/L (ref 98–111)
Creatinine: <0.3 mg/dL — ABNORMAL LOW (ref 0.44–1.00)
Glucose, Bld: 131 mg/dL — ABNORMAL HIGH (ref 70–99)
Potassium: 3.7 mmol/L (ref 3.5–5.1)
Sodium: 135 mmol/L (ref 135–145)
Total Bilirubin: 0.7 mg/dL (ref 0.3–1.2)
Total Protein: 6.1 g/dL — ABNORMAL LOW (ref 6.5–8.1)

## 2022-11-17 MED ORDER — SODIUM CHLORIDE 0.9 % IV SOLN
80.0000 mg/m2 | Freq: Once | INTRAVENOUS | Status: AC
  Administered 2022-11-17: 102 mg via INTRAVENOUS
  Filled 2022-11-17: qty 17

## 2022-11-17 MED ORDER — SODIUM CHLORIDE 0.9 % IV SOLN
420.0000 mg | Freq: Once | INTRAVENOUS | Status: AC
  Administered 2022-11-17: 420 mg via INTRAVENOUS
  Filled 2022-11-17: qty 14

## 2022-11-17 MED ORDER — ACETAMINOPHEN 325 MG PO TABS
650.0000 mg | ORAL_TABLET | Freq: Once | ORAL | Status: AC
  Administered 2022-11-17: 650 mg via ORAL
  Filled 2022-11-17: qty 2

## 2022-11-17 MED ORDER — SODIUM CHLORIDE 0.9 % IV SOLN
10.0000 mg | Freq: Once | INTRAVENOUS | Status: AC
  Administered 2022-11-17: 10 mg via INTRAVENOUS
  Filled 2022-11-17: qty 10

## 2022-11-17 MED ORDER — SODIUM CHLORIDE 0.9% FLUSH
10.0000 mL | INTRAVENOUS | Status: DC | PRN
  Administered 2022-11-17: 10 mL

## 2022-11-17 MED ORDER — SODIUM CHLORIDE 0.9 % IV SOLN: Freq: Once | INTRAVENOUS | Status: AC

## 2022-11-17 MED ORDER — HEPARIN SOD (PORK) LOCK FLUSH 100 UNIT/ML IV SOLN
500.0000 [IU] | Freq: Once | INTRAVENOUS | Status: AC | PRN
  Administered 2022-11-17: 500 [IU]

## 2022-11-17 MED ORDER — SODIUM CHLORIDE 0.9% FLUSH
10.0000 mL | Freq: Once | INTRAVENOUS | Status: AC
  Administered 2022-11-17: 10 mL

## 2022-11-17 MED ORDER — TRASTUZUMAB-DKST CHEMO 150 MG IV SOLR
6.0000 mg/kg | Freq: Once | INTRAVENOUS | Status: AC
  Administered 2022-11-17: 210 mg via INTRAVENOUS
  Filled 2022-11-17: qty 10

## 2022-11-17 MED ORDER — FAMOTIDINE IN NACL 20-0.9 MG/50ML-% IV SOLN
20.0000 mg | Freq: Once | INTRAVENOUS | Status: AC
  Administered 2022-11-17: 20 mg via INTRAVENOUS
  Filled 2022-11-17: qty 50

## 2022-11-17 MED ORDER — DIPHENHYDRAMINE HCL 50 MG/ML IJ SOLN
50.0000 mg | Freq: Once | INTRAMUSCULAR | Status: AC
  Administered 2022-11-17: 50 mg via INTRAVENOUS
  Filled 2022-11-17: qty 1

## 2022-11-17 NOTE — Progress Notes (Signed)
Radiation Oncology         [phone] ________________________________  Name: Martha [name] MRN: [id number]  Date: 11/17/2022  DOB: April 11, 1961  Follow-Up Visit Note  Outpatient  CC: Martha Barrack, MD  Martha Barrack, MD  Diagnosis and Prior Radiotherapy:    ICD-10-CM   1. Metastasis to brain ([place])  C79.31       CHIEF COMPLAINT: Here for follow-up and surveillance of brain and bone cancer  Narrative:  The patient returns today for routine follow-up.  Martha [name] presents today for follow-up after completing [place] treatment to her brain and external beam palliative radiation to lower spine in February 2024  Does the patient complain of any of the following: Headache: Denies Visual Changes: Denies Hearing Changes: Denies Nausea: Denies--reports appetite has improved Wt Readings from Last 3 Encounters:  11/17/22 76 lb 12.8 oz (34.8 kg)  11/17/22 76 lb 8 oz (34.7 kg)  11/09/22 75 lb (34 kg)   Vomiting: Denies Balance or coordination issues: Denies Memory issues: Denies  Is the patient currently on a Decadron regimen? : N/A  Additional comments if applicable: Met with Dr. Burr Medico as well this morning. Per patient, Dr. Burr Medico has ordered an x-ray of lower spine to be completed after her infusion appointment for new pain/nodules to lower back, and then a CT scan to be completed at a later date. Brain MRI scheduled for April  She feels that her ambulation is improving every day.  She reports that the pain in her lower spine is 2 out of 10.  Is actually better since she received radiation to the low lumbar and sacral spine last month.  She denies any significant neck pain                              ALLERGIES:  is allergic to morphine and related.  Meds: Current Outpatient Medications  Medication Sig Dispense Refill   PARoxetine (PAXIL) 20 MG tablet Take 20 mg by mouth daily.     acetaminophen (TYLENOL) 500 MG tablet Take 2 tablets (1,000 mg total) by mouth every 8  (eight) hours. 30 tablet 0   citalopram (CELEXA) 40 MG tablet Take 1 tablet (40 mg total) by mouth daily. 30 tablet 3   levothyroxine (SYNTHROID) 75 MCG tablet Take 1 tablet (75 mcg total) by mouth daily before breakfast. 30 tablet 0   lidocaine (XYLOCAINE) 2 % solution Patient: Mix 1part 2% viscous lidocaine, 1part H20. Swallow 54mL of diluted mixture, 39min before meals and at bedtime, up to QID 100 mL 3   lidocaine-prilocaine (EMLA) cream Apply to affected area once (Patient taking differently: Apply 1 Application topically daily as needed (burning).) 30 g 3   LORazepam (ATIVAN) 0.5 MG tablet Take 1 tablet (0.5 mg total) by mouth every 6 (six) hours as needed for anxiety (agitation). 30 tablet 0   Mouthwashes (MOUTH RINSE) LIQD solution 15 mLs by Mouth Rinse route as needed (for oral care).  0   ondansetron (ZOFRAN) 8 MG tablet Take 1 tablet (8 mg total) by mouth every 8 (eight) hours as needed for nausea or vomiting. 20 tablet 1   OVER THE COUNTER MEDICATION Take 1 tablet by mouth daily. Protandim Supplement     oxyCODONE (OXY IR/ROXICODONE) 5 MG immediate release tablet Take 0.5-1 tablets (2.5-5 mg total) by mouth every 4 (four) hours as needed for moderate pain or severe pain (dyspnea). 30 tablet 0   pregabalin (  LYRICA) 25 MG capsule Take 1 capsule (25 mg total) by mouth 2 (two) times daily. 60 capsule 2   senna-docusate (SENOKOT-S) 8.6-50 MG tablet Take 1 tablet by mouth 2 (two) times daily. 60 tablet 0   No current facility-administered medications for this encounter.    Physical Findings: The patient is in no acute distress. Patient is alert and oriented.  height is 5\' 3"  (1.6 m) and weight is 76 lb 12.8 oz (34.8 kg). Her temperature is 97.8 F (36.6 C). Her blood pressure is 103/73 and her pulse is 82. Her respiration is 16 and oxygen saturation is 99%. .    In general, she is cachectic and lying down on the examination table. MSK: Diffuse muscle wasting but able to move all  extremities against mild resistance.  Protuberance of sacral bone in lower back, consistent with physical exam prior to palliative radiation to spine last month Neuro: No focalities  Lab Findings: Lab Results  Component Value Date   WBC 3.9 (L) 11/17/2022   HGB 10.6 (L) 11/17/2022   HCT 31.1 (L) 11/17/2022   MCV 95.1 11/17/2022   PLT 324 11/17/2022    Radiographic Findings: DG Lumbar Spine Complete  Result Date: 11/17/2022 CLINICAL DATA:  Palpable lump in right paraspinal region, back pain EXAM: LUMBAR SPINE - COMPLETE 4+ VIEW COMPARISON:  MRI lumbar spine done on 09/05/2022 FINDINGS: Alignment of posterior margins of vertebral bodies is within normal limits. There is mild levoscoliosis. There is mild decrease in height of multiple lower thoracic and lumbar vertebral bodies. There is slightly inhomogeneous attenuation in vertebral bodies. Degenerative changes are noted in facet joints most severe at L4-L5 and L5-S1 levels. Arterial calcifications are seen in soft tissues. IMPRESSION: There is interval mild decrease in height of multiple lower thoracic and lumbar vertebral bodies. In the previous MRI examination, there was evidence of skeletal metastatic disease. Possibility of pathological compression fractures should be considered. Alignment of posterior margins of vertebral bodies is within normal limits. If it is clinically necessary to evaluate the soft tissues, follow-up MRI may be considered. Electronically Signed   By: Elmer Picker M.D.   On: 11/17/2022 16:27   DG Chest 2 View  Result Date: 10/29/2022 CLINICAL DATA:  Pain in right ribs, rule out fracture. EXAM: CHEST - 2 VIEW COMPARISON:  10/16/2022. FINDINGS: The heart is enlarged and the mediastinal contour is within normal limits. There is atherosclerotic calcification of the aorta. There are small bilateral pleural effusions with atelectasis or infiltrate at the lung bases. No pneumothorax. A right chest port terminates at the  cavoatrial junction. No acute fracture is seen. Cervical spinal fusion hardware is identified. Surgical clips are noted in the left axilla and left upper quadrant. IMPRESSION: 1. Small bilateral pleural effusions with atelectasis or infiltrate at the lung bases. 2. Cardiomegaly. Electronically Signed   By: Brett Fairy M.D.   On: 10/29/2022 03:51   DG Abd 1 View  Result Date: 10/18/2022 CLINICAL DATA:  Abdominal pain EXAM: ABDOMEN - 1 VIEW COMPARISON:  None Available. FINDINGS: Scattered large and small bowel gas is noted. No obstructive changes are seen. No free air is noted. Degenerative changes of the lumbar spine are seen. IMPRESSION: No acute abnormality noted. Electronically Signed   By: Inez Catalina M.D.   On: 10/18/2022 18:03    Impression/Plan: She tolerated palliative radiation well last month and has now received 3 cycles of systemic therapy.  She will proceed with an x-ray of her lower spine given the  bone protuberance that was noted in medical oncology.  On physical exam this protuberance seems consistent to me compared to last month and the fact that she is not having increased pain, but rather improvement of her pain since palliative radiation, is reassuring.  However I think it is reasonable for her to get an x-ray of her lower spine to serve as a baseline study and rule out any significantly displaced bone fractures.  She will have a brain MRI of in April and follow-up with neurosurgery at that time.  I will see her back after her next brain MRI -she is pleased with this plan  On date of service, in total, I spent 20 minutes on this encounter. Patient was seen in person.  _____________________________________   Eppie Gibson, MD

## 2022-11-17 NOTE — Progress Notes (Signed)
[place]   [phone] [phone]   Clinic Follow up Note   Patient Care Team: [name], MD as PCP - General (Family Medicine) [name], MD as Consulting Physician (General Surgery) Truitt Merle, MD as Consulting Physician (Hematology) Eppie Gibson, MD as Attending Physician (Radiation Oncology) Alla Feeling, NP as Nurse Practitioner (Nurse Practitioner)  Date of Service:  11/17/2022  CHIEF COMPLAINT: f/u of left breast cancer and Right    CURRENT THERAPY:   Zometa q3 months/   Paclitaxel+ Trastuzumab+ Pertuzumab q21d x 8 cycles /Trastuzumab+Pertuzumab q21 x4 cyclesstarting 10/19/2022   ASSESSMENT:  Martha [name] is a 62 y.o. female with   Malignant neoplasm of lower-inner quadrant of left breast in female, estrogen receptor positive ([place]) --She was diagnosed in 12/2017. She is s/p left breast lumpectomy and adjuvant radiation.  -She started anti-estrogen therapy with letrozole on 05/2018. Tolerating well with no issues -lost f/u after visit in 04/2020 until her recurrence in 07/2022   Metastatic breast cancer to bone and brain -Diagnosed in 06/2022 --PET scan from 08/03/2022 showed diffuse bone mets  -she underwent cervical laminectomy and fusion by Dr. Marcello Moores on 08/17/2022, biopsy confirmed metastatic breast cancer, ER/PR negative, HER2 positive. -due to cervical cord compression with myelopathy, she underwent a second cervical spine surgery on September 06, 2022, and completed inpatient rehabitation. -She has completed brain, cervical and lumbar spine radiation in Feb 2024 -She was recently hospitalized for altered mental status and confusion, probably secondary to medication -She started first line systemic chemotherapy with weekly Taxol and trastuzumab/Perjeta on 10/25/22. She has tolerated well so far and her pain has improved overall   Cancer related pain -She is on low-dose oxycodone, she has developed a significant  pain in the right rib cage, probably related to her bone metastasis  -f/u with palliative care clinic NP Nikki   Right paraspinal bony lesion  -pt noticed a week ago -will get an x-ray today   PLAN: -Order Xray for lumbar spine today -proceed with C2 taxol,perjeta, trastuzumab today -Order restaging CT and MRI scan on next visit -lab/flush and treatment 11/24/2022 -f/u in 2 weeks    SUMMARY OF ONCOLOGIC HISTORY: Oncology History Overview Note  Cancer Staging Malignant neoplasm of lower-inner quadrant of left breast in female, estrogen receptor positive ([place]) Staging form: Breast, AJCC 8th Edition - Clinical stage from 01/16/2018: Stage IA (cT1c, cN0, cM0, G2, ER+, PR+, HER2-) - Signed by Truitt Merle, MD on 01/23/2018 - Pathologic: Stage IA (pT1c, pN1, cM0, G1, ER+, PR+, HER2-) - Signed by Eppie Gibson, MD on 04/09/2018     Malignant neoplasm of lower-inner quadrant of left breast in female, estrogen receptor positive ([place])  01/15/2018 Mammogram   Diagnositc Mammogram 01/15/18  IMPRESSION: 1. Suspicious 1.2 x 1.4 x 1.3 cm mixed echogenicity mass left breast 7 o'clock position retroareolar location at the site of palpable concern.. 2. Indeterminate Within the left breast 7:30 o'clock retroareolar location, adjacent to the palpable mass, is a 0.5 x 0.4 x 0.5 cm oval circumscribed hypoechoic mass. 3. Indeterminate calcifications within the lateral left breast. Location of these calcifications is not definitely confirmed on the true lateral view.    01/16/2018 Initial Biopsy   Diagnosis 01/16/18 1. Breast, left, needle core biopsy, 7:30 o'clock (ribbon clip) - FIBROCYSTIC CHANGES WITH SCLEROSING ADENOSIS AND CALCIFICATIONS. - FIBROADENOMATOID CHANGE. - NO MALIGNANCY IDENTIFIED. 2. Breast, left, needle core biopsy, 7 o'clock position (coil clip) - INVASIVE MAMMARY CARCINOMA, MSBR GRADE I/II. - SEE  MICROSCOPIC DESCRIPTION Microscopic Comment  ADDENDUM: Immunohistochemistry for  E-Cadherin is strongly positive in the tumor consistent with ductal carcinoma. (JDP:ah 01/17/18)   01/16/2018 Receptors her2   Estrogen Receptor: 100%, POSITIVE, STRONG STAINING INTENSITY Progesterone Receptor: 50%, POSITIVE, STRONG STAINING INTENSITY Proliferation Marker Ki67: 20% HER2 Negative   01/16/2018 Cancer Staging   Staging form: Breast, AJCC 8th Edition - Clinical stage from 01/16/2018: Stage IA (cT1c, cN0, cM0, G2, ER+, PR+, HER2-) - Signed by Truitt Merle, MD on 01/23/2018   01/22/2018 Initial Diagnosis   Malignant neoplasm of lower-inner quadrant of left breast in female, estrogen receptor positive ([place])   02/21/2018 Surgery    LEFT BREAST LUMPECTOMY WITH AXILLARY LYMPH NODE BIOPSY by Dr. Barry Dienes  02/21/18   02/21/2018 Pathology Results   Diagnosis 02/21/18 1. Breast, lumpectomy, Left - INVASIVE DUCTAL CARCINOMA, GRADE I, 1.6 CM. - DUCTAL CARCINOMA IN SITU, INTERMEDIATE NUCLEAR GRADE. - ANTERIOR AND MEDIAL RESECTION MARGINS ARE POSITIVE FOR CARCINOMA. - NEGATIVE FOR LYMPHOVASCULAR OR PERINEURAL INVASION. - BACKGROUND BREAST TISSUE WITH FIBROCYSTIC CHANGE, INCLUDING SCLEROSING ADENOSIS. - BIOPSY SITE CHANGES. - SEE ONCOLOGY TABLE. 2. Lymph node, sentinel, biopsy, Left Axillary #1 - METASTATIC BREAST CARCINOMA TO A LYMPH NODE, 1.0 CM IN GREATEST DIMENSION, WITH EXTRANODAL EXTENSION (1/1). 3. Lymph node, sentinel, biopsy, Left Axillary #2 - LYMPH NODE, NEGATIVE FOR CARCINOMA (0/1).    02/21/2018 Miscellaneous   Mammaprint 02/21/18 Low Risk with 10-year risk of recurrnce at 10% -No potential signifcant chemotherapy benefit   03/20/2018 Pathology Results   RE-EXCISION OF BREAST LUMPECTOMY by Dr. Barry Dienes  Diagnosis 03/20/18 1. Breast, excision, Left new anterior margin - FIBROCYSTIC CHANGES WITH ADENOSIS AND CALCIFICATIONS. - HEALING BIOPSY SITE. - THERE IS NO EVIDENCE OF MALIGNANCY. 2. Breast, excision, Left new medial margin - FIBROCYSTIC CHANGES WITH ADENOSIS AND  CALCIFICATIONS. - HEALING BIOPSY SITE. - THERE IS NO EVIDENCE OF MALIGNANCY. Microscopic Comment 1. -2. The surgical resection margin(s) of the specimen were inked and microscopically evaluated. (JBK:kh 03-22-18)   04/09/2018 Cancer Staging   Staging form: Breast, AJCC 8th Edition - Pathologic: Stage IA (pT1c, pN1, cM0, G1, ER+, PR+, HER2-) - Signed by Eppie [place], MD on 04/09/2018   04/22/2018 - 06/03/2018 Radiation Therapy   Radaiton with Dr. Isidore Moos 04/22/18-06/03/18   05/2018 -  Anti-estrogen oral therapy   Letrozole 2.5mg  started 05/2018    Survivorship   Per Cira Rue, NP    05/04/2022 Imaging    IMPRESSION: Cervical spondylosis, as described.   Nonspecific straightening of the expected cervical lordosis.   07/24/2022 Imaging    IMPRESSION: 1. Extensive osseous metastatic disease with pathologic fracture at the base of dens and C2 right lateral mass. Extraosseous tumor at C1 and C2 likely impinging on the right C2 and C3 nerve roots. 2. Degenerative cord impingement at C4-5 to C6-7. Biforaminal impingement at C5-6 and C6-7.   08/03/2022 PET scan    IMPRESSION: 1. Large volume osseous metastasis. 2. Low right cervical and probable right axillary nodal metastasis. 3. Subtle heterogeneous activity throughout the liver with suggestion of small liver lesions (likely new compared to chest CT of 07/16/2020). Findings are overall moderately suspicious for hepatic metastasis. Pre and post contrast abdominal MRI (preferred) or CT could confirm. 4. Right-sided pleural thickening and trace pleural fluid. Right base airspace disease is favored to represent chronic atelectasis. 5. Aortic atherosclerosis (ICD10-I70.0) and emphysema (ICD10-J43.9).     08/11/2022 Genetic Testing   Negative genetic testing on the CancerNext-Expanded+RNAinsight panel.  The report date is August 11, 2022.  The CancerNext-Expanded gene panel offered by [facility] and includes sequencing and  rearrangement analysis for the following 77 genes: AIP, ALK, APC*, ATM*, AXIN2, BAP1, BARD1, BLM, BMPR1A, BRCA1*, BRCA2*, BRIP1*, CDC73, CDH1*, CDK4, CDKN1B, CDKN2A, CHEK2*, CTNNA1, DICER1, FANCC, FH, FLCN, GALNT12, KIF1B, LZTR1, MAX, MEN1, MET, MLH1*, MSH2*, MSH3, MSH6*, MUTYH*, NBN, NF1*, NF2, NTHL1, PALB2*, PHOX2B, PMS2*, POT1, PRKAR1A, PTCH1, PTEN*, RAD51C*, RAD51D*, RB1, RECQL, RET, SDHA, SDHAF2, SDHB, SDHC, SDHD, SMAD4, SMARCA4, SMARCB1, SMARCE1, STK11, SUFU, TMEM127, TP53*, TSC1, TSC2, VHL and XRCC2 (sequencing and deletion/duplication); EGFR, EGLN1, HOXB13, KIT, MITF, PDGFRA, POLD1, and POLE (sequencing only); EPCAM and GREM1 (deletion/duplication only). DNA and RNA analyses performed for * genes.    Lobular carcinoma in situ (LCIS) of right breast  01/20/2020 Mammogram   Diagnostic Mammogram 01/20/20 IMPRESSION: 1.  Stable post lumpectomy changes of the left breast.   2. Suspicious microcalcifications over the right upper outer quadrant spanning 3.6 cm.   02/02/2020 Initial Biopsy   Diagnosis 02/02/20 Breast, right, needle core biopsy, upper outer quadrant, x clip - LOBULAR CARCINOMA IN SITU WITH PLEOMORPHIC FEATURES AND CALCIFICATIONS, INVOLVING ADENOSIS. SEE NOTE Diagnosis Note Immunohistochemical stain for E-cadherin is negative in the lesional cells, consistent with a lobular phenotype. Immunostains for p63, SMM 1 and calponin do not show evidence of invasive carcinoma.    02/04/2020 Initial Diagnosis   Lobular carcinoma in situ (LCIS) of right breast   03/18/2020 Surgery   RIGHT BREAST LUMPECTOMY WITH RADIOACTIVE SEED LOCALIZATION by Dr Alessandra Bevels    03/18/2020 Pathology Results   FINAL MICROSCOPIC DIAGNOSIS:   A. BREAST, RIGHT, LUMPECTOMY:  - Pleomorphic lobular carcinoma in situ with calcifications and  underlying complex sclerosing lesion, adenosis and fibroadenomatoid  change.  - Margins of resection are not involved (Closest margins: < 1 mm,  anterior, posterior, inferior and  medial).  - Biopsy site.    COMMENT:   P63, Calponin and SMM-1 demonstrate the presence of myoepithelium in the  select focus.    Metastatic breast cancer to bone and brain  05/04/2022 Imaging    IMPRESSION: Cervical spondylosis, as described.   Nonspecific straightening of the expected cervical lordosis.   07/24/2022 Imaging    IMPRESSION: 1. Extensive osseous metastatic disease with pathologic fracture at the base of dens and C2 right lateral mass. Extraosseous tumor at C1 and C2 likely impinging on the right C2 and C3 nerve roots. 2. Degenerative cord impingement at C4-5 to C6-7. Biforaminal impingement at C5-6 and C6-7.   08/02/2022 Initial Diagnosis   Cancer, metastatic to bone ([place])   08/03/2022 PET scan    IMPRESSION: 1. Large volume osseous metastasis. 2. Low right cervical and probable right axillary nodal metastasis. 3. Subtle heterogeneous activity throughout the liver with suggestion of small liver lesions (likely new compared to chest CT of 07/16/2020). Findings are overall moderately suspicious for hepatic metastasis. Pre and post contrast abdominal MRI (preferred) or CT could confirm. 4. Right-sided pleural thickening and trace pleural fluid. Right base airspace disease is favored to represent chronic atelectasis. 5. Aortic atherosclerosis (ICD10-I70.0) and emphysema (ICD10-J43.9).     10/26/2022 -  Chemotherapy   Patient is on Treatment Plan : BREAST Paclitaxel D1,8,15 + Trastuzumab D1 + Pertuzumab D1 q21d x 8 cycles / Trastuzumab D1 + Pertuzumab D1 q21d x 4 cycles        INTERVAL HISTORY:  Tahnya Jaggard is here for a follow up of left breast cancer and Right . She was last seen by me on 11/02/2022. She presents to the  clinic accompanied by husband.Pt stat that she is doing good. Pt state that she had a bump on her spine, pt denied having injury. Pt state her neck is better. Pt take Tylenol prn.Pt report of having diarrhea and she took Imodium. Pt  denies having numbness and tingling. Pt report of having physical and Occupational therapy.   All other systems were reviewed with the patient and are negative.  MEDICAL HISTORY:  Past Medical History:  Diagnosis Date   Anxiety    Cancer ([place]) 2022   right breast LCIS   Cancer ([place]) 2019   left breast   Depression    Dysrhythmia    SVT, s/p ablation ~ 2012 in [facility]   Ectopic pregnancy    Family history of breast cancer    Family history of melanoma    History of radiation therapy 04/22/18-06/03/18   Left Breast, left SCV, axilla 50 Gy in 25 fractions, Left breast boost 10 Gy in 5 fractions.    Hypothyroidism    Personal history of radiation therapy    PONV (postoperative nausea and vomiting)    Thyroid disease     SURGICAL HISTORY: Past Surgical History:  Procedure Laterality Date   APPENDECTOMY     BILATERAL SALPINGECTOMY     BREAST BIOPSY Right 2014   fibroadenoma   BREAST BIOPSY Left 01/16/2018   BREAST BIOPSY Right 02/02/2020   LCIS   BREAST BIOPSY Right 08/04/2020   x2 LCIS   BREAST BIOPSY Right 08/13/2020   x2   BREAST EXCISIONAL BIOPSY Left 1990   benign   BREAST EXCISIONAL BIOPSY Right 02/02/2020   LCIS   BREAST LUMPECTOMY Left 01/22/2018   BREAST LUMPECTOMY WITH AXILLARY LYMPH NODE BIOPSY Left 02/21/2018   Procedure: LEFT BREAST LUMPECTOMY WITH AXILLARY LYMPH NODE BIOPSY;  Surgeon: [name], MD;  Location: [facility];  Service: General;  Laterality: Left;   BREAST LUMPECTOMY WITH RADIOACTIVE SEED LOCALIZATION Right 03/18/2020   Procedure: RIGHT BREAST LUMPECTOMY WITH RADIOACTIVE SEED LOCALIZATION;  Surgeon: [name], MD;  Location: [place];  Service: General;  Laterality: Right;   BREAST SURGERY Left 1993   cyst removed   ENDOMETRIAL ABLATION     EYE SURGERY     GLAUCOMA SURGERY Bilateral    IR IMAGING GUIDED PORT INSERTION  10/16/2022   POSTERIOR CERVICAL FUSION/FORAMINOTOMY N/A 08/17/2022   Procedure: Cervical One-Cervical Four  POSTERIOR CERVICAL FUSION,  Cervical One LAMINECTOMY REDUCTION OF Cervical Two Fracture, Biopsy of Right Cerical Two Pars  Lesion;  Surgeon: Vallarie Mare, MD;  Location: [facility];  Service: Neurosurgery;  Laterality: N/A;   POSTERIOR CERVICAL FUSION/FORAMINOTOMY N/A 09/06/2022   Procedure: Posterior Cervical Fusion, Foraminotomy , Cervical Five-Six, Cervical Six-Seven; Extension of  fusion Cervical Four- Thoracic One;  Surgeon: Vallarie Mare, MD;  Location: [place];  Service: Neurosurgery;  Laterality: N/A;   RADIOACTIVE SEED GUIDED EXCISIONAL BREAST BIOPSY Right 04/28/2021   Procedure: RADIOACTIVE SEED GUIDED EXCISIONAL RIGHT BREAST BIOPSY X2;  Surgeon: [name], MD;  Location: [place];  Service: General;  Laterality: Right;   RE-EXCISION OF BREAST LUMPECTOMY Left 03/20/2018   Procedure: RE-EXCISION OF BREAST LUMPECTOMY;  Surgeon: [name], MD;  Location: [facility];  Service: General;  Laterality: Left;    I have reviewed the social history and family history with the patient and they are unchanged from previous note.  ALLERGIES:  is allergic to morphine and related.  MEDICATIONS:  Current Outpatient Medications  Medication Sig  Dispense Refill   acetaminophen (TYLENOL) 500 MG tablet Take 2 tablets (1,000 mg total) by mouth every 8 (eight) hours. 30 tablet 0   citalopram (CELEXA) 40 MG tablet Take 1 tablet (40 mg total) by mouth daily. 30 tablet 3   levothyroxine (SYNTHROID) 75 MCG tablet Take 1 tablet (75 mcg total) by mouth daily before breakfast. 30 tablet 0   lidocaine (XYLOCAINE) 2 % solution Patient: Mix 1part 2% viscous lidocaine, 1part H20. Swallow 42mL of diluted mixture, 55min before meals and at bedtime, up to QID 100 mL 3   lidocaine-prilocaine (EMLA) cream Apply to affected area once (Patient taking differently: Apply 1 Application topically daily as needed (burning).) 30 g 3   LORazepam (ATIVAN) 0.5 MG tablet Take 1 tablet (0.5 mg  total) by mouth every 6 (six) hours as needed for anxiety (agitation). 30 tablet 0   Mouthwashes (MOUTH RINSE) LIQD solution 15 mLs by Mouth Rinse route as needed (for oral care).  0   ondansetron (ZOFRAN) 8 MG tablet Take 1 tablet (8 mg total) by mouth every 8 (eight) hours as needed for nausea or vomiting. 20 tablet 1   OVER THE COUNTER MEDICATION Take 1 tablet by mouth daily. Protandim Supplement     oxyCODONE (OXY IR/ROXICODONE) 5 MG immediate release tablet Take 0.5-1 tablets (2.5-5 mg total) by mouth every 4 (four) hours as needed for moderate pain or severe pain (dyspnea). 30 tablet 0   PARoxetine (PAXIL) 20 MG tablet Take 20 mg by mouth daily.     pregabalin (LYRICA) 25 MG capsule Take 1 capsule (25 mg total) by mouth 2 (two) times daily. 60 capsule 2   senna-docusate (SENOKOT-S) 8.6-50 MG tablet Take 1 tablet by mouth 2 (two) times daily. 60 tablet 0   No current facility-administered medications for this visit.   Facility-Administered Medications Ordered in Other Visits  Medication Dose Route Frequency Provider Last Rate Last Admin   heparin lock flush 100 unit/mL  500 Units Intracatheter Once PRN Truitt Merle, MD       PACLitaxel (TAXOL) 102 mg in sodium chloride 0.9 % 250 mL chemo infusion (</= 80mg /m2)  80 mg/m2 (Order-Specific) Intravenous Once Truitt Merle, MD 267 mL/hr at 11/17/22 1426 102 mg at 11/17/22 1426   sodium chloride flush (NS) 0.9 % injection 10 mL  10 mL Intracatheter PRN Truitt Merle, MD        PHYSICAL EXAMINATION: ECOG PERFORMANCE STATUS: 3 - Symptomatic, >50% confined to bed  Vitals:   11/17/22 0954  BP: 103/73  Pulse: 82  Resp: 14  Temp: 97.9 F (36.6 C)  SpO2: 99%   Wt Readings from Last 3 Encounters:  11/17/22 76 lb 12.8 oz (34.8 kg)  11/17/22 76 lb 8 oz (34.7 kg)  11/09/22 75 lb (34 kg)     GENERAL:alert, no distress and comfortable SKIN: skin color normal, no rashes or significant lesions EYES: normal, Conjunctiva are pink and non-injected, sclera  clear  NEURO: alert & oriented x 3 with fluent speech  LABORATORY DATA:  I have reviewed the data as listed    Latest Ref Rng & Units 11/17/2022    9:34 AM 11/09/2022    1:10 PM 11/02/2022    9:15 AM  CBC  WBC 4.0 - 10.5 K/uL 3.9  3.0  3.0   Hemoglobin 12.0 - 15.0 g/dL 10.6  11.4  11.9   Hematocrit 36.0 - 46.0 % 31.1  32.2  33.9   Platelets 150 - 400 K/uL 324  286  249         Latest Ref Rng & Units 11/17/2022    9:34 AM 11/09/2022    1:10 PM 11/02/2022    9:15 AM  CMP  Glucose 70 - 99 mg/dL 131  143  135   BUN 8 - 23 mg/dL 8  12  12    Creatinine 0.44 - 1.00 mg/dL <0.30  <0.30  <0.30   Sodium 135 - 145 mmol/L 135  135  136   Potassium 3.5 - 5.1 mmol/L 3.7  4.0  3.7   Chloride 98 - 111 mmol/L 101  100  100   CO2 22 - 32 mmol/L 29  30  28    Calcium 8.9 - 10.3 mg/dL 8.3  8.7  8.5   Total Protein 6.5 - 8.1 g/dL 6.1  6.3  6.3   Total Bilirubin 0.3 - 1.2 mg/dL 0.7  0.8  1.0   Alkaline Phos 38 - 126 U/L 1,774  2,373  2,090   AST 15 - 41 U/L 22  34  45   ALT 0 - 44 U/L 20  30  28        RADIOGRAPHIC STUDIES: I have personally reviewed the radiological images as listed and agreed with the findings in the report. No results found.    Orders Placed This Encounter  Procedures   DG Lumbar Spine Complete    Standing Status:   Future    Standing Expiration Date:   11/17/2023    Order Specific Question:   Reason for Exam (SYMPTOM  OR DIAGNOSIS REQUIRED)    Answer:   right paraspinal lump in low back, rule out metastasis vs bone structure    Order Specific Question:   Preferred imaging location?    Answer:   [facility]   CBC with Differential ([place] Only)    Standing Status:   Future    Standing Expiration Date:   12/28/2023   CMP ([place] only)    Standing Status:   Future    Standing Expiration Date:   12/28/2023   CBC with Differential ([place] Only)    Standing Status:   Future    Standing Expiration Date:   01/04/2024   CMP ([place] only)     Standing Status:   Future    Standing Expiration Date:   01/04/2024   CBC with Differential ([place] Only)    Standing Status:   Future    Standing Expiration Date:   01/11/2024   CMP ([place] only)    Standing Status:   Future    Standing Expiration Date:   01/11/2024   All questions were answered. The patient knows to call the clinic with any problems, questions or concerns. No barriers to learning was detected. The total time spent in the appointment was 30 minutes.     Truitt Merle, MD 11/17/2022   Felicity Coyer, CMA, am acting as scribe for Truitt Merle, MD.   I have reviewed the above documentation for accuracy and completeness, and I agree with the above.

## 2022-11-17 NOTE — Progress Notes (Signed)
Nutrition Follow-up:  Patient with recurrent breast cancer with mets to brain and bone. Patient is status post radiation therapy. She is currently receiving taxol, perjeta + ogivri q21d  Met with patient in infusion. Husband is present today. Patient is in good spirits. She reports appetite has been picking up, specifically in the last week. Husband says patient is calling him at work with food request to pick up on the way home. Patient eating a variety of foods. In the last week she recalls baked potato loaded with butter/sour cream/chives, wings with blue cheese, single chicken tender, tomato soup and 1/2 grilled cheese, guacamole/chips. She is drinking Ensure. Reports 2-3 everyday. Patient is feeling stronger. Husband reports she is up and walking around at home. She continues working with home PT/OT a couple days a week. Patient denies nausea, vomiting, diarrhea, constipation. Dysphagia has resolved.   Medications: reviewed   Labs: glucose 131, Cr <0.30, Ca 8.3, Hgb 10.6  Anthropometrics: Weight 76 lb 12.8 oz today increased from 75 lb on 3/14    NUTRITION DIAGNOSIS: Inadequate oral intake improving    INTERVENTION:  Continue strategies for increasing calories and protein with small frequent meals/snacks Continue drinking 3 Ensure Plus/equivalent. Suggested drinking between meals  Recommend high protein snack following PT to support muscle growth One complimentary case Ensure Plus HP provided      MONITORING, EVALUATION, GOAL: weight trends, intake    NEXT VISIT: Thursday April 25 during infusion

## 2022-11-17 NOTE — Patient Instructions (Signed)
[facility]  Discharge Instructions: Thank you for choosing [facility] to provide your oncology and hematology care.   If you have a lab appointment with the Cancer Center, please go directly to the Cancer Center and check in at the registration area.   Wear comfortable clothing and clothing appropriate for easy access to any Portacath or PICC line.   We strive to give you quality time with your provider. You may need to reschedule your appointment if you arrive late (15 or more minutes).  Arriving late affects you and other patients whose appointments are after yours.  Also, if you miss three or more appointments without notifying the office, you may be dismissed from the clinic at the provider's discretion.      For prescription refill requests, have your pharmacy contact our office and allow 72 hours for refills to be completed.    Today you received the following chemotherapy and/or immunotherapy agents Paclitaxel      To help prevent nausea and vomiting after your treatment, we encourage you to take your nausea medication as directed.  BELOW ARE SYMPTOMS THAT SHOULD BE REPORTED IMMEDIATELY: *FEVER GREATER THAN 100.4 F (38 C) OR HIGHER *CHILLS OR SWEATING *NAUSEA AND VOMITING THAT IS NOT CONTROLLED WITH YOUR NAUSEA MEDICATION *UNUSUAL SHORTNESS OF BREATH *UNUSUAL BRUISING OR BLEEDING *URINARY PROBLEMS (pain or burning when urinating, or frequent urination) *BOWEL PROBLEMS (unusual diarrhea, constipation, pain near the anus) TENDERNESS IN MOUTH AND THROAT WITH OR WITHOUT PRESENCE OF ULCERS (sore throat, sores in mouth, or a toothache) UNUSUAL RASH, SWELLING OR PAIN  UNUSUAL VAGINAL DISCHARGE OR ITCHING   Items with * indicate a potential emergency and should be followed up as soon as possible or go to the Emergency Department if any problems should occur.  Please show the CHEMOTHERAPY ALERT CARD or IMMUNOTHERAPY ALERT CARD at  check-in to the Emergency Department and triage nurse.  Should you have questions after your visit or need to cancel or reschedule your appointment, please contact [facility]  Dept: [phone]  and follow the prompts.  Office hours are 8:00 a.m. to 4:30 p.m. Monday - Friday. Please note that voicemails left after 4:00 p.m. may not be returned until the following business day.  We are closed weekends and major holidays. You have access to a nurse at all times for urgent questions. Please call the main number to the clinic Dept: [phone] and follow the prompts.   For any non-urgent questions, you may also contact your provider using MyChart. We now offer e-Visits for anyone 18 and older to request care online for non-urgent symptoms. For details visit [email or web address].   Also download the MyChart app! Go to the app store, search "MyChart", open the app, select [place], and log in with your MyChart username and password.   

## 2022-11-17 NOTE — Progress Notes (Signed)
Martha [name] presents today for follow-up after completing [place] treatment to her brain on 10/11/2022  Does the patient complain of any of the following: Headache: Denies Visual Changes: Denies Hearing Changes: Denies Nausea: Denies--reports appetite has improved Wt Readings from Last 3 Encounters:  11/17/22 76 lb 12.8 oz (34.8 kg)  11/17/22 76 lb 8 oz (34.7 kg)  11/09/22 75 lb (34 kg)   Vomiting: Denies Balance or coordination issues: Denies Memory issues: Denies  Is the patient currently on a Decadron regimen? : N/A  Additional comments if applicable: Met with Dr. Burr Medico as well this morning. Per patient, Dr. Burr Medico has ordered an x-ray of lower spine to be completed after her infusion appointment for new pain/nodules to lower back, and then a CT scan to be completed at a later date. Brain MRI scheduled for April

## 2022-11-20 DIAGNOSIS — Z7901 Long term (current) use of anticoagulants: Secondary | ICD-10-CM | POA: Diagnosis not present

## 2022-11-20 DIAGNOSIS — Z170 Estrogen receptor positive status [ER+]: Secondary | ICD-10-CM | POA: Diagnosis not present

## 2022-11-20 DIAGNOSIS — F32A Depression, unspecified: Secondary | ICD-10-CM | POA: Diagnosis not present

## 2022-11-20 DIAGNOSIS — M542 Cervicalgia: Secondary | ICD-10-CM | POA: Diagnosis not present

## 2022-11-20 DIAGNOSIS — Z981 Arthrodesis status: Secondary | ICD-10-CM | POA: Diagnosis not present

## 2022-11-20 DIAGNOSIS — E876 Hypokalemia: Secondary | ICD-10-CM | POA: Diagnosis not present

## 2022-11-20 DIAGNOSIS — F419 Anxiety disorder, unspecified: Secondary | ICD-10-CM | POA: Diagnosis not present

## 2022-11-20 DIAGNOSIS — Z9012 Acquired absence of left breast and nipple: Secondary | ICD-10-CM | POA: Diagnosis not present

## 2022-11-20 DIAGNOSIS — E43 Unspecified severe protein-calorie malnutrition: Secondary | ICD-10-CM | POA: Diagnosis not present

## 2022-11-20 DIAGNOSIS — E039 Hypothyroidism, unspecified: Secondary | ICD-10-CM | POA: Diagnosis not present

## 2022-11-20 DIAGNOSIS — Z87891 Personal history of nicotine dependence: Secondary | ICD-10-CM | POA: Diagnosis not present

## 2022-11-20 DIAGNOSIS — G893 Neoplasm related pain (acute) (chronic): Secondary | ICD-10-CM | POA: Diagnosis not present

## 2022-11-20 DIAGNOSIS — Z79891 Long term (current) use of opiate analgesic: Secondary | ICD-10-CM | POA: Diagnosis not present

## 2022-11-20 DIAGNOSIS — C50312 Malignant neoplasm of lower-inner quadrant of left female breast: Secondary | ICD-10-CM | POA: Diagnosis not present

## 2022-11-20 DIAGNOSIS — C7951 Secondary malignant neoplasm of bone: Secondary | ICD-10-CM | POA: Diagnosis not present

## 2022-11-20 NOTE — Progress Notes (Signed)
[facility]  Telephone:(336) [phone] Fax:(336) [phone]   Name: Thula Kurczewski Date: 11/20/2022 MRN: [id number]  DOB: 1960-12-31  Patient Care Team: [name], MD as PCP - General (Family Medicine) [name], MD as Consulting Physician (General Surgery) Truitt Merle, MD as Consulting Physician (Hematology) Eppie Gibson, MD as Attending Physician (Radiation Oncology) Alla Feeling, NP as Nurse Practitioner (Nurse Practitioner)    INTERVAL HISTORY: Martha [name] is a 62 y.o. female with oncologic medical history including ER+ breast cancer (01/2018), right breast cancer (04/2021), now with metastatic disease progression involving brain and liver, pathological fractures with osseous mets s/p brain radiation. .  Palliative ask to see for symptom management and goals of care.   SOCIAL HISTORY:     reports that she has been smoking cigarettes. She has a 20.00 pack-year smoking history. She has never used smokeless tobacco. She reports that she does not currently use alcohol after a past usage of about 28.0 standard drinks of alcohol per week. She reports that she does not use drugs.  ADVANCE DIRECTIVES:  Has documents on file  CODE STATUS: DNR  PAST MEDICAL HISTORY: Past Medical History:  Diagnosis Date   Anxiety    Cancer ([place]) 2022   right breast LCIS   Cancer ([place]) 2019   left breast   Depression    Dysrhythmia    SVT, s/p ablation ~ 2012 in at [facility]   Ectopic pregnancy    Family history of breast cancer    Family history of melanoma    History of radiation therapy 04/22/18-06/03/18   Left Breast, left SCV, axilla 50 Gy in 25 fractions, Left breast boost 10 Gy in 5 fractions.    Hypothyroidism    Personal history of radiation therapy    PONV (postoperative nausea and vomiting)    Thyroid disease     ALLERGIES:  is allergic to morphine and related.  MEDICATIONS:  Current Outpatient  Medications  Medication Sig Dispense Refill   acetaminophen (TYLENOL) 500 MG tablet Take 2 tablets (1,000 mg total) by mouth every 8 (eight) hours. 30 tablet 0   citalopram (CELEXA) 40 MG tablet Take 1 tablet (40 mg total) by mouth daily. 30 tablet 3   levothyroxine (SYNTHROID) 75 MCG tablet Take 1 tablet (75 mcg total) by mouth daily before breakfast. 30 tablet 0   lidocaine (XYLOCAINE) 2 % solution Patient: Mix 1part 2% viscous lidocaine, 1part H20. Swallow 73mL of diluted mixture, 69min before meals and at bedtime, up to QID 100 mL 3   lidocaine-prilocaine (EMLA) cream Apply to affected area once (Patient taking differently: Apply 1 Application topically daily as needed (burning).) 30 g 3   LORazepam (ATIVAN) 0.5 MG tablet Take 1 tablet (0.5 mg total) by mouth every 6 (six) hours as needed for anxiety (agitation). 30 tablet 0   Mouthwashes (MOUTH RINSE) LIQD solution 15 mLs by Mouth Rinse route as needed (for oral care).  0   ondansetron (ZOFRAN) 8 MG tablet Take 1 tablet (8 mg total) by mouth every 8 (eight) hours as needed for nausea or vomiting. 20 tablet 1   OVER THE COUNTER MEDICATION Take 1 tablet by mouth daily. Protandim Supplement     oxyCODONE (OXY IR/ROXICODONE) 5 MG immediate release tablet Take 0.5-1 tablets (2.5-5 mg total) by mouth every 4 (four) hours as needed for moderate pain or severe pain (dyspnea). 30 tablet 0   PARoxetine (PAXIL) 20 MG tablet Take 20  mg by mouth daily.     pregabalin (LYRICA) 25 MG capsule Take 1 capsule (25 mg total) by mouth 2 (two) times daily. 60 capsule 2   senna-docusate (SENOKOT-S) 8.6-50 MG tablet Take 1 tablet by mouth 2 (two) times daily. 60 tablet 0   No current facility-administered medications for this visit.    VITAL SIGNS: There were no vitals taken for this visit. There were no vitals filed for this visit.  Estimated body mass index is 13.6 kg/m as calculated from the following:   Height as of 11/17/22: 5\' 3"  (1.6 m).   Weight as of  11/17/22: 76 lb 12.8 oz (34.8 kg).   PERFORMANCE STATUS (ECOG) : 3 - Symptomatic, >50% confined to bed   Physical Exam General: NAD, cachectic, in wheelchair  Cardiovascular: regular rate and rhythm Pulmonary: clear ant fields Abdomen: soft, nontender, + bowel sounds Extremities: no edema, no joint deformities Skin: no rashes, muscle wasting  Neurological: AAO x5   IMPRESSION: Martha [name] presents to clinic for symptom management follow-up. No acute distress. Reports her appreciation of how well she is feeling. Husband reports that she is able to get up and move around a little more in the home with assistance.  Appetite has improved.  Neoplasm related pain Mrs. Nikolich reports pain is well-controlled. Feels the increase in her Lyrica (adding night time dose) has significantly allowed for some of her improvement. Does continue to have pain and discomfort with movement however feels it is manageable.  She was able to stand up for me and allow me to assess her bottom without difficulty but did require standby assistance.   We discussed her current pain regimen.  She is taking 2.5-5 mg of oxycodone IR as needed for pain.  Lyrica 25 mg twice daily.  Tolerating medications without difficulty.  Given sensitivity to recent pain medication changes in the past would be reluctant to make any significant adjustments to her medications out of fear of confusion and hallucinations.    We will continue to closely monitor and adjust as needed.  Patient is having some sensitivity to her sacral area.  Education provided on due to muscle wasting and thinness this can be causing some irritation to the bony areas.  Assessment to sacral area with no findings of open wounds.  Some redness and irritation.  Education provided on using barrier cream.  Patient also provided with supportive dressing to allow additional cushioning.  Discussed sitting on egg crate cushion or donut pillow when sitting for long periods of time  and paying close attention to shifting weight every 1-2 hours.   Decreased appetite/weight loss Appetite has improved. Drinking 2-3 protein drinks daily in addition to other nutrition.  Weight is 77 pounds.  This is up from 75 pounds on 3/14.    3. Goals of care  10/26/22-We discussed her current illness and what it means in the larger context of Her on-going co-morbidities. Natural disease trajectory and expectations were discussed.   Ms. Hoerner and her husband are realistic in their understanding.  They are clear on expressed wishes to continue to treat the treatable allow her every opportunity to continue to thrive.  She acknowledges at some point her health will continue to decline requiring her to focus more on her comfort and foregoing treatments.  She asked appropriate questions of appropriateness of discontinuing treatments.  She is not quite ready to make these decisions and would like to take things one day at a time.   Extensive education  provided on the differences between palliative and hospice services.  We discussed what both entities would look like in the home/outpatient.  Patient and husband verbalized understanding and appreciation.   I empathetically approach discussions regarding advanced directives and code status.  Patient has a documented advanced directive that was recently completed.  Her husband [name] is her primary medical decision-maker and her Sister Threasa Beards is secondary. Patient confirms wishes for DNR/DNI.  Out of facility form has been completed.    Education provided on the MOLST form.  As requested form was completed today.  We discussed Her current illness and what it means in the larger context of Her on-going co-morbidities. Natural disease trajectory and expectations were discussed.  I discussed the importance of continued conversation with family and their medical providers regarding overall plan of care and treatment options, ensuring decisions are within  the context of the patients values and GOCs.  PLAN: DNR/DNI/MOST form on file Continue oxycodone as needed for pain Lyrica 25 mg twice daily (tolerating) Protective skin barrier to sacral area. Ongoing goals of care discussions and symptom management I will plan to see patient back in 2-4 weeks in collaboration to other oncology appointments.  Patient and husband knows to contact office sooner if needed.   Patient expressed understanding and was in agreement with this plan. She also understands that She can call the clinic at any time with any questions, concerns, or complaints.      Any controlled substances utilized were prescribed in the context of palliative care. PDMP has been reviewed.    Time Total: 20 min   Visit consisted of counseling and education dealing with the complex and emotionally intense issues of symptom management and palliative care in the setting of serious and potentially life-threatening illness.Greater than 50%  of this time was spent counseling and coordinating care related to the above assessment and plan.  Alda Lea, AGPCNP-BC  Palliative Medicine Team/[place]    *Please note that this is a verbal dictation therefore any spelling or grammatical errors are due to the "[employer] [employer]" system interpretation.

## 2022-11-22 ENCOUNTER — Ambulatory Visit: Payer: BC Managed Care – PPO

## 2022-11-22 ENCOUNTER — Encounter: Payer: Self-pay | Admitting: Hematology

## 2022-11-22 DIAGNOSIS — M542 Cervicalgia: Secondary | ICD-10-CM | POA: Diagnosis not present

## 2022-11-22 DIAGNOSIS — Z87891 Personal history of nicotine dependence: Secondary | ICD-10-CM | POA: Diagnosis not present

## 2022-11-22 DIAGNOSIS — C7951 Secondary malignant neoplasm of bone: Secondary | ICD-10-CM | POA: Diagnosis not present

## 2022-11-22 DIAGNOSIS — E876 Hypokalemia: Secondary | ICD-10-CM | POA: Diagnosis not present

## 2022-11-22 DIAGNOSIS — Z79891 Long term (current) use of opiate analgesic: Secondary | ICD-10-CM | POA: Diagnosis not present

## 2022-11-22 DIAGNOSIS — F419 Anxiety disorder, unspecified: Secondary | ICD-10-CM | POA: Diagnosis not present

## 2022-11-22 DIAGNOSIS — Z170 Estrogen receptor positive status [ER+]: Secondary | ICD-10-CM | POA: Diagnosis not present

## 2022-11-22 DIAGNOSIS — E43 Unspecified severe protein-calorie malnutrition: Secondary | ICD-10-CM | POA: Diagnosis not present

## 2022-11-22 DIAGNOSIS — E039 Hypothyroidism, unspecified: Secondary | ICD-10-CM | POA: Diagnosis not present

## 2022-11-22 DIAGNOSIS — G893 Neoplasm related pain (acute) (chronic): Secondary | ICD-10-CM | POA: Diagnosis not present

## 2022-11-22 DIAGNOSIS — Z9012 Acquired absence of left breast and nipple: Secondary | ICD-10-CM | POA: Diagnosis not present

## 2022-11-22 DIAGNOSIS — Z7901 Long term (current) use of anticoagulants: Secondary | ICD-10-CM | POA: Diagnosis not present

## 2022-11-22 DIAGNOSIS — Z981 Arthrodesis status: Secondary | ICD-10-CM | POA: Diagnosis not present

## 2022-11-22 DIAGNOSIS — C50312 Malignant neoplasm of lower-inner quadrant of left female breast: Secondary | ICD-10-CM | POA: Diagnosis not present

## 2022-11-22 DIAGNOSIS — F32A Depression, unspecified: Secondary | ICD-10-CM | POA: Diagnosis not present

## 2022-11-23 MED FILL — Dexamethasone Sodium Phosphate Inj 100 MG/10ML: INTRAMUSCULAR | Qty: 1 | Status: AC

## 2022-11-24 ENCOUNTER — Other Ambulatory Visit: Payer: Self-pay

## 2022-11-24 ENCOUNTER — Inpatient Hospital Stay: Payer: BC Managed Care – PPO

## 2022-11-24 ENCOUNTER — Inpatient Hospital Stay (HOSPITAL_BASED_OUTPATIENT_CLINIC_OR_DEPARTMENT_OTHER): Payer: BC Managed Care – PPO | Admitting: Nurse Practitioner

## 2022-11-24 ENCOUNTER — Encounter: Payer: Self-pay | Admitting: Nurse Practitioner

## 2022-11-24 VITALS — BP 110/67 | HR 99 | Temp 97.9°F | Resp 17 | Wt 77.7 lb

## 2022-11-24 VITALS — BP 143/84 | HR 95 | Resp 18

## 2022-11-24 DIAGNOSIS — R630 Anorexia: Secondary | ICD-10-CM

## 2022-11-24 DIAGNOSIS — R634 Abnormal weight loss: Secondary | ICD-10-CM | POA: Diagnosis not present

## 2022-11-24 DIAGNOSIS — Z515 Encounter for palliative care: Secondary | ICD-10-CM

## 2022-11-24 DIAGNOSIS — Z923 Personal history of irradiation: Secondary | ICD-10-CM | POA: Diagnosis not present

## 2022-11-24 DIAGNOSIS — C7951 Secondary malignant neoplasm of bone: Secondary | ICD-10-CM | POA: Diagnosis not present

## 2022-11-24 DIAGNOSIS — Z170 Estrogen receptor positive status [ER+]: Secondary | ICD-10-CM | POA: Diagnosis not present

## 2022-11-24 DIAGNOSIS — Z95828 Presence of other vascular implants and grafts: Secondary | ICD-10-CM

## 2022-11-24 DIAGNOSIS — M47816 Spondylosis without myelopathy or radiculopathy, lumbar region: Secondary | ICD-10-CM | POA: Diagnosis not present

## 2022-11-24 DIAGNOSIS — Z79899 Other long term (current) drug therapy: Secondary | ICD-10-CM | POA: Diagnosis not present

## 2022-11-24 DIAGNOSIS — C50312 Malignant neoplasm of lower-inner quadrant of left female breast: Secondary | ICD-10-CM | POA: Diagnosis not present

## 2022-11-24 DIAGNOSIS — G893 Neoplasm related pain (acute) (chronic): Secondary | ICD-10-CM

## 2022-11-24 DIAGNOSIS — Z79890 Hormone replacement therapy (postmenopausal): Secondary | ICD-10-CM | POA: Diagnosis not present

## 2022-11-24 DIAGNOSIS — Z5112 Encounter for antineoplastic immunotherapy: Secondary | ICD-10-CM | POA: Diagnosis not present

## 2022-11-24 DIAGNOSIS — C7931 Secondary malignant neoplasm of brain: Secondary | ICD-10-CM | POA: Diagnosis not present

## 2022-11-24 LAB — CBC WITH DIFFERENTIAL (CANCER CENTER ONLY)
Abs Immature Granulocytes: 0.06 10*3/uL (ref 0.00–0.07)
Basophils Absolute: 0 10*3/uL (ref 0.0–0.1)
Basophils Relative: 1 %
Eosinophils Absolute: 0 10*3/uL (ref 0.0–0.5)
Eosinophils Relative: 0 %
HCT: 31.4 % — ABNORMAL LOW (ref 36.0–46.0)
Hemoglobin: 10.6 g/dL — ABNORMAL LOW (ref 12.0–15.0)
Immature Granulocytes: 1 %
Lymphocytes Relative: 20 %
Lymphs Abs: 0.9 10*3/uL (ref 0.7–4.0)
MCH: 32.9 pg (ref 26.0–34.0)
MCHC: 33.8 g/dL (ref 30.0–36.0)
MCV: 97.5 fL (ref 80.0–100.0)
Monocytes Absolute: 0.4 10*3/uL (ref 0.1–1.0)
Monocytes Relative: 8 %
Neutro Abs: 3.1 10*3/uL (ref 1.7–7.7)
Neutrophils Relative %: 70 %
Platelet Count: 282 10*3/uL (ref 150–400)
RBC: 3.22 MIL/uL — ABNORMAL LOW (ref 3.87–5.11)
RDW: 18.8 % — ABNORMAL HIGH (ref 11.5–15.5)
WBC Count: 4.5 10*3/uL (ref 4.0–10.5)
nRBC: 0.4 % — ABNORMAL HIGH (ref 0.0–0.2)

## 2022-11-24 LAB — CMP (CANCER CENTER ONLY)
ALT: 18 U/L (ref 0–44)
AST: 22 U/L (ref 15–41)
Albumin: 3.9 g/dL (ref 3.5–5.0)
Alkaline Phosphatase: 956 U/L — ABNORMAL HIGH (ref 38–126)
Anion gap: 3 — ABNORMAL LOW (ref 5–15)
BUN: 13 mg/dL (ref 8–23)
CO2: 30 mmol/L (ref 22–32)
Calcium: 8.7 mg/dL — ABNORMAL LOW (ref 8.9–10.3)
Chloride: 100 mmol/L (ref 98–111)
Creatinine: <0.3 mg/dL — ABNORMAL LOW (ref 0.44–1.00)
Glucose, Bld: 108 mg/dL — ABNORMAL HIGH (ref 70–99)
Potassium: 4.1 mmol/L (ref 3.5–5.1)
Sodium: 133 mmol/L — ABNORMAL LOW (ref 135–145)
Total Bilirubin: 0.6 mg/dL (ref 0.3–1.2)
Total Protein: 6 g/dL — ABNORMAL LOW (ref 6.5–8.1)

## 2022-11-24 MED ORDER — SODIUM CHLORIDE 0.9 % IV SOLN: Freq: Once | INTRAVENOUS | Status: AC

## 2022-11-24 MED ORDER — SODIUM CHLORIDE 0.9% FLUSH
10.0000 mL | INTRAVENOUS | Status: DC | PRN
  Administered 2022-11-24: 10 mL

## 2022-11-24 MED ORDER — FAMOTIDINE IN NACL 20-0.9 MG/50ML-% IV SOLN
20.0000 mg | Freq: Once | INTRAVENOUS | Status: AC
  Administered 2022-11-24: 20 mg via INTRAVENOUS
  Filled 2022-11-24: qty 50

## 2022-11-24 MED ORDER — SODIUM CHLORIDE 0.9% FLUSH
10.0000 mL | Freq: Once | INTRAVENOUS | Status: AC
  Administered 2022-11-24: 10 mL

## 2022-11-24 MED ORDER — SODIUM CHLORIDE 0.9 % IV SOLN
10.0000 mg | Freq: Once | INTRAVENOUS | Status: AC
  Administered 2022-11-24: 10 mg via INTRAVENOUS
  Filled 2022-11-24: qty 10

## 2022-11-24 MED ORDER — SODIUM CHLORIDE 0.9 % IV SOLN
80.0000 mg/m2 | Freq: Once | INTRAVENOUS | Status: AC
  Administered 2022-11-24: 102 mg via INTRAVENOUS
  Filled 2022-11-24: qty 17

## 2022-11-24 MED ORDER — DIPHENHYDRAMINE HCL 50 MG/ML IJ SOLN
50.0000 mg | Freq: Once | INTRAMUSCULAR | Status: AC
  Administered 2022-11-24: 50 mg via INTRAVENOUS
  Filled 2022-11-24: qty 1

## 2022-11-24 MED ORDER — HEPARIN SOD (PORK) LOCK FLUSH 100 UNIT/ML IV SOLN
500.0000 [IU] | Freq: Once | INTRAVENOUS | Status: AC | PRN
  Administered 2022-11-24: 500 [IU]

## 2022-11-24 NOTE — Patient Instructions (Signed)
[facility]  Discharge Instructions: Thank you for choosing [facility] to provide your oncology and hematology care.   If you have a lab appointment with the Cancer Center, please go directly to the Cancer Center and check in at the registration area.   Wear comfortable clothing and clothing appropriate for easy access to any Portacath or PICC line.   We strive to give you quality time with your provider. You may need to reschedule your appointment if you arrive late (15 or more minutes).  Arriving late affects you and other patients whose appointments are after yours.  Also, if you miss three or more appointments without notifying the office, you may be dismissed from the clinic at the provider's discretion.      For prescription refill requests, have your pharmacy contact our office and allow 72 hours for refills to be completed.    Today you received the following chemotherapy and/or immunotherapy agents Paclitaxel      To help prevent nausea and vomiting after your treatment, we encourage you to take your nausea medication as directed.  BELOW ARE SYMPTOMS THAT SHOULD BE REPORTED IMMEDIATELY: *FEVER GREATER THAN 100.4 F (38 C) OR HIGHER *CHILLS OR SWEATING *NAUSEA AND VOMITING THAT IS NOT CONTROLLED WITH YOUR NAUSEA MEDICATION *UNUSUAL SHORTNESS OF BREATH *UNUSUAL BRUISING OR BLEEDING *URINARY PROBLEMS (pain or burning when urinating, or frequent urination) *BOWEL PROBLEMS (unusual diarrhea, constipation, pain near the anus) TENDERNESS IN MOUTH AND THROAT WITH OR WITHOUT PRESENCE OF ULCERS (sore throat, sores in mouth, or a toothache) UNUSUAL RASH, SWELLING OR PAIN  UNUSUAL VAGINAL DISCHARGE OR ITCHING   Items with * indicate a potential emergency and should be followed up as soon as possible or go to the Emergency Department if any problems should occur.  Please show the CHEMOTHERAPY ALERT CARD or IMMUNOTHERAPY ALERT CARD at  check-in to the Emergency Department and triage nurse.  Should you have questions after your visit or need to cancel or reschedule your appointment, please contact [facility]  Dept: [phone]  and follow the prompts.  Office hours are 8:00 a.m. to 4:30 p.m. Monday - Friday. Please note that voicemails left after 4:00 p.m. may not be returned until the following business day.  We are closed weekends and major holidays. You have access to a nurse at all times for urgent questions. Please call the main number to the clinic Dept: [phone] and follow the prompts.   For any non-urgent questions, you may also contact your provider using MyChart. We now offer e-Visits for anyone 18 and older to request care online for non-urgent symptoms. For details visit [email or web address].   Also download the MyChart app! Go to the app store, search "MyChart", open the app, select [facility], and log in with your MyChart username and password.   

## 2022-11-28 DIAGNOSIS — C50312 Malignant neoplasm of lower-inner quadrant of left female breast: Secondary | ICD-10-CM | POA: Diagnosis not present

## 2022-11-28 DIAGNOSIS — Z981 Arthrodesis status: Secondary | ICD-10-CM | POA: Diagnosis not present

## 2022-11-28 DIAGNOSIS — E43 Unspecified severe protein-calorie malnutrition: Secondary | ICD-10-CM | POA: Diagnosis not present

## 2022-11-28 DIAGNOSIS — Z79891 Long term (current) use of opiate analgesic: Secondary | ICD-10-CM | POA: Diagnosis not present

## 2022-11-28 DIAGNOSIS — F32A Depression, unspecified: Secondary | ICD-10-CM | POA: Diagnosis not present

## 2022-11-28 DIAGNOSIS — C7951 Secondary malignant neoplasm of bone: Secondary | ICD-10-CM | POA: Diagnosis not present

## 2022-11-28 DIAGNOSIS — Z9012 Acquired absence of left breast and nipple: Secondary | ICD-10-CM | POA: Diagnosis not present

## 2022-11-28 DIAGNOSIS — E039 Hypothyroidism, unspecified: Secondary | ICD-10-CM | POA: Diagnosis not present

## 2022-11-28 DIAGNOSIS — E876 Hypokalemia: Secondary | ICD-10-CM | POA: Diagnosis not present

## 2022-11-28 DIAGNOSIS — F419 Anxiety disorder, unspecified: Secondary | ICD-10-CM | POA: Diagnosis not present

## 2022-11-28 DIAGNOSIS — Z170 Estrogen receptor positive status [ER+]: Secondary | ICD-10-CM | POA: Diagnosis not present

## 2022-11-28 DIAGNOSIS — G893 Neoplasm related pain (acute) (chronic): Secondary | ICD-10-CM | POA: Diagnosis not present

## 2022-11-28 DIAGNOSIS — Z87891 Personal history of nicotine dependence: Secondary | ICD-10-CM | POA: Diagnosis not present

## 2022-11-28 DIAGNOSIS — Z7901 Long term (current) use of anticoagulants: Secondary | ICD-10-CM | POA: Diagnosis not present

## 2022-11-28 DIAGNOSIS — M542 Cervicalgia: Secondary | ICD-10-CM | POA: Diagnosis not present

## 2022-11-29 ENCOUNTER — Telehealth: Payer: Self-pay | Admitting: Hematology

## 2022-11-29 MED FILL — Dexamethasone Sodium Phosphate Inj 100 MG/10ML: INTRAMUSCULAR | Qty: 1 | Status: AC

## 2022-11-29 NOTE — Progress Notes (Unsigned)
[place]   [phone] [phone]   Clinic Follow up Note   Patient Care Team: [name], MD as PCP - General (Family Medicine) [name], MD as Consulting Physician (General Surgery) Truitt Merle, MD as Consulting Physician (Hematology) Eppie Gibson, MD as Attending Physician (Radiation Oncology) Alla Feeling, NP as Nurse Practitioner (Nurse Practitioner)  Date of Service:  11/30/2022  CHIEF COMPLAINT: f/u of left breast cancer and Right     CURRENT THERAPY:  Zometa q3 months/   Paclitaxel+ Trastuzumab+ Pertuzumab q21d x 8 cycles /Trastuzumab+Pertuzumab q21 x4 cyclesstarting 10/19/2022      ASSESSMENT & PLAN:  Martha [name] is a 62 y.o. female with    Metastatic breast cancer to bone and brain -Diagnosed in 06/2022 --PET scan from 08/03/2022 showed diffuse bone mets  -she underwent cervical laminectomy and fusion by Dr. Marcello Moores on 08/17/2022, biopsy confirmed metastatic breast cancer, ER/PR negative, HER2 positive. -due to cervical cord compression with myelopathy, she underwent a second cervical spine surgery on September 06, 2022, and completed inpatient rehabitation. -She has completed brain, cervical and lumbar spine radiation in Feb 2024 -She was recently hospitalized for altered mental status and confusion, probably secondary to medication -She started first line systemic chemotherapy with weekly Taxol and trastuzumab/Perjeta on 10/25/22. She has tolerated well so far and her pain has improved overall  Malignant neoplasm of lower-inner quadrant of left breast in female, estrogen receptor positive ([place]) --She was diagnosed in 12/2017. She is s/p left breast lumpectomy and adjuvant radiation.  -She started anti-estrogen therapy with letrozole on 05/2018. Tolerating well with no issues -lost f/u after visit in 04/2020 until her recurrence in 07/2022   Cancer related pain -She is on low-dose oxycodone, she has developed a  significant pain in the right rib cage, probably related to her bone metastasis  -f/u with palliative care clinic NP Nikki   PLAN: -lab reviewed -Brain MRI schedule for 4/25 -repeat PET scan in May  -proceed with D15, C2 TAXOL, lab adequate for treatment -order CT scan next visit -lab/flush and f/u and Perjeta,Taxol,Ogivri 12/07/2022    SUMMARY OF ONCOLOGIC HISTORY: Oncology History Overview Note  Cancer Staging Malignant neoplasm of lower-inner quadrant of left breast in female, estrogen receptor positive ([place]) Staging form: Breast, AJCC 8th Edition - Clinical stage from 01/16/2018: Stage IA (cT1c, cN0, cM0, G2, ER+, PR+, HER2-) - Signed by Truitt Merle, MD on 01/23/2018 - Pathologic: Stage IA (pT1c, pN1, cM0, G1, ER+, PR+, HER2-) - Signed by Eppie Gibson, MD on 04/09/2018     Malignant neoplasm of lower-inner quadrant of left breast in female, estrogen receptor positive  01/15/2018 Mammogram   Diagnositc Mammogram 01/15/18  IMPRESSION: 1. Suspicious 1.2 x 1.4 x 1.3 cm mixed echogenicity mass left breast 7 o'clock position retroareolar location at the site of palpable concern.. 2. Indeterminate Within the left breast 7:30 o'clock retroareolar location, adjacent to the palpable mass, is a 0.5 x 0.4 x 0.5 cm oval circumscribed hypoechoic mass. 3. Indeterminate calcifications within the lateral left breast. Location of these calcifications is not definitely confirmed on the true lateral view.    01/16/2018 Initial Biopsy   Diagnosis 01/16/18 1. Breast, left, needle core biopsy, 7:30 o'clock (ribbon clip) - FIBROCYSTIC CHANGES WITH SCLEROSING ADENOSIS AND CALCIFICATIONS. - FIBROADENOMATOID CHANGE. - NO MALIGNANCY IDENTIFIED. 2. Breast, left, needle core biopsy, 7 o'clock position (coil clip) - INVASIVE MAMMARY CARCINOMA, MSBR GRADE I/II. - SEE MICROSCOPIC DESCRIPTION Microscopic Comment  ADDENDUM: Immunohistochemistry for E-Cadherin  is strongly positive in the tumor consistent  with ductal carcinoma. (JDP:ah 01/17/18)   01/16/2018 Receptors her2   Estrogen Receptor: 100%, POSITIVE, STRONG STAINING INTENSITY Progesterone Receptor: 50%, POSITIVE, STRONG STAINING INTENSITY Proliferation Marker Ki67: 20% HER2 Negative   01/16/2018 Cancer Staging   Staging form: Breast, AJCC 8th Edition - Clinical stage from 01/16/2018: Stage IA (cT1c, cN0, cM0, G2, ER+, PR+, HER2-) - Signed by Truitt Merle, MD on 01/23/2018   01/22/2018 Initial Diagnosis   Malignant neoplasm of lower-inner quadrant of left breast in female, estrogen receptor positive ([place])   02/21/2018 Surgery    LEFT BREAST LUMPECTOMY WITH AXILLARY LYMPH NODE BIOPSY by Dr. Barry Dienes  02/21/18   02/21/2018 Pathology Results   Diagnosis 02/21/18 1. Breast, lumpectomy, Left - INVASIVE DUCTAL CARCINOMA, GRADE I, 1.6 CM. - DUCTAL CARCINOMA IN SITU, INTERMEDIATE NUCLEAR GRADE. - ANTERIOR AND MEDIAL RESECTION MARGINS ARE POSITIVE FOR CARCINOMA. - NEGATIVE FOR LYMPHOVASCULAR OR PERINEURAL INVASION. - BACKGROUND BREAST TISSUE WITH FIBROCYSTIC CHANGE, INCLUDING SCLEROSING ADENOSIS. - BIOPSY SITE CHANGES. - SEE ONCOLOGY TABLE. 2. Lymph node, sentinel, biopsy, Left Axillary #1 - METASTATIC BREAST CARCINOMA TO A LYMPH NODE, 1.0 CM IN GREATEST DIMENSION, WITH EXTRANODAL EXTENSION (1/1). 3. Lymph node, sentinel, biopsy, Left Axillary #2 - LYMPH NODE, NEGATIVE FOR CARCINOMA (0/1).    02/21/2018 Miscellaneous   Mammaprint 02/21/18 Low Risk with 10-year risk of recurrnce at 10% -No potential signifcant chemotherapy benefit   03/20/2018 Pathology Results   RE-EXCISION OF BREAST LUMPECTOMY by Dr. Barry Dienes  Diagnosis 03/20/18 1. Breast, excision, Left new anterior margin - FIBROCYSTIC CHANGES WITH ADENOSIS AND CALCIFICATIONS. - HEALING BIOPSY SITE. - THERE IS NO EVIDENCE OF MALIGNANCY. 2. Breast, excision, Left new medial margin - FIBROCYSTIC CHANGES WITH ADENOSIS AND CALCIFICATIONS. - HEALING BIOPSY SITE. - THERE IS NO EVIDENCE OF  MALIGNANCY. Microscopic Comment 1. -2. The surgical resection margin(s) of the specimen were inked and microscopically evaluated. (JBK:kh 03-22-18)   04/09/2018 Cancer Staging   Staging form: Breast, AJCC 8th Edition - Pathologic: Stage IA (pT1c, pN1, cM0, G1, ER+, PR+, HER2-) - Signed by Eppie Gibson, MD on 04/09/2018   04/22/2018 - 06/03/2018 Radiation Therapy   Radaiton with Dr. Isidore Moos 04/22/18-06/03/18   05/2018 -  Anti-estrogen oral therapy   Letrozole 2.5mg  started 05/2018    Survivorship   Per Cira Rue, NP    05/04/2022 Imaging    IMPRESSION: Cervical spondylosis, as described.   Nonspecific straightening of the expected cervical lordosis.   07/24/2022 Imaging    IMPRESSION: 1. Extensive osseous metastatic disease with pathologic fracture at the base of dens and C2 right lateral mass. Extraosseous tumor at C1 and C2 likely impinging on the right C2 and C3 nerve roots. 2. Degenerative cord impingement at C4-5 to C6-7. Biforaminal impingement at C5-6 and C6-7.   08/03/2022 PET scan    IMPRESSION: 1. Large volume osseous metastasis. 2. Low right cervical and probable right axillary nodal metastasis. 3. Subtle heterogeneous activity throughout the liver with suggestion of small liver lesions (likely new compared to chest CT of 07/16/2020). Findings are overall moderately suspicious for hepatic metastasis. Pre and post contrast abdominal MRI (preferred) or CT could confirm. 4. Right-sided pleural thickening and trace pleural fluid. Right base airspace disease is favored to represent chronic atelectasis. 5. Aortic atherosclerosis (ICD10-I70.0) and emphysema (ICD10-J43.9).     08/11/2022 Genetic Testing   Negative genetic testing on the CancerNext-Expanded+RNAinsight panel.  The report date is August 11, 2022.  The CancerNext-Expanded gene panel offered by [facility] and  includes sequencing and rearrangement analysis for the following 77 genes: AIP, ALK, APC*,  ATM*, AXIN2, BAP1, BARD1, BLM, BMPR1A, BRCA1*, BRCA2*, BRIP1*, CDC73, CDH1*, CDK4, CDKN1B, CDKN2A, CHEK2*, CTNNA1, DICER1, FANCC, FH, FLCN, GALNT12, KIF1B, LZTR1, MAX, MEN1, MET, MLH1*, MSH2*, MSH3, MSH6*, MUTYH*, NBN, NF1*, NF2, NTHL1, PALB2*, PHOX2B, PMS2*, POT1, PRKAR1A, PTCH1, PTEN*, RAD51C*, RAD51D*, RB1, RECQL, RET, SDHA, SDHAF2, SDHB, SDHC, SDHD, SMAD4, SMARCA4, SMARCB1, SMARCE1, STK11, SUFU, TMEM127, TP53*, TSC1, TSC2, VHL and XRCC2 (sequencing and deletion/duplication); EGFR, EGLN1, HOXB13, KIT, MITF, PDGFRA, POLD1, and POLE (sequencing only); EPCAM and GREM1 (deletion/duplication only). DNA and RNA analyses performed for * genes.    Lobular carcinoma in situ (LCIS) of right breast  01/20/2020 Mammogram   Diagnostic Mammogram 01/20/20 IMPRESSION: 1.  Stable post lumpectomy changes of the left breast.   2. Suspicious microcalcifications over the right upper outer quadrant spanning 3.6 cm.   02/02/2020 Initial Biopsy   Diagnosis 02/02/20 Breast, right, needle core biopsy, upper outer quadrant, x clip - LOBULAR CARCINOMA IN SITU WITH PLEOMORPHIC FEATURES AND CALCIFICATIONS, INVOLVING ADENOSIS. SEE NOTE Diagnosis Note Immunohistochemical stain for E-cadherin is negative in the lesional cells, consistent with a lobular phenotype. Immunostains for p63, SMM 1 and calponin do not show evidence of invasive carcinoma.    02/04/2020 Initial Diagnosis   Lobular carcinoma in situ (LCIS) of right breast   03/18/2020 Surgery   RIGHT BREAST LUMPECTOMY WITH RADIOACTIVE SEED LOCALIZATION by Dr Alessandra Bevels    03/18/2020 Pathology Results   FINAL MICROSCOPIC DIAGNOSIS:   A. BREAST, RIGHT, LUMPECTOMY:  - Pleomorphic lobular carcinoma in situ with calcifications and  underlying complex sclerosing lesion, adenosis and fibroadenomatoid  change.  - Margins of resection are not involved (Closest margins: < 1 mm,  anterior, posterior, inferior and medial).  - Biopsy site.    COMMENT:   P63, Calponin and SMM-1  demonstrate the presence of myoepithelium in the  select focus.    Metastatic breast cancer to bone and brain  05/04/2022 Imaging    IMPRESSION: Cervical spondylosis, as described.   Nonspecific straightening of the expected cervical lordosis.   07/24/2022 Imaging    IMPRESSION: 1. Extensive osseous metastatic disease with pathologic fracture at the base of dens and C2 right lateral mass. Extraosseous tumor at C1 and C2 likely impinging on the right C2 and C3 nerve roots. 2. Degenerative cord impingement at C4-5 to C6-7. Biforaminal impingement at C5-6 and C6-7.   08/02/2022 Initial Diagnosis   Cancer, metastatic to bone ([place])   08/03/2022 PET scan    IMPRESSION: 1. Large volume osseous metastasis. 2. Low right cervical and probable right axillary nodal metastasis. 3. Subtle heterogeneous activity throughout the liver with suggestion of small liver lesions (likely new compared to chest CT of 07/16/2020). Findings are overall moderately suspicious for hepatic metastasis. Pre and post contrast abdominal MRI (preferred) or CT could confirm. 4. Right-sided pleural thickening and trace pleural fluid. Right base airspace disease is favored to represent chronic atelectasis. 5. Aortic atherosclerosis (ICD10-I70.0) and emphysema (ICD10-J43.9).     10/26/2022 -  Chemotherapy   Patient is on Treatment Plan : BREAST Paclitaxel D1,8,15 + Trastuzumab D1 + Pertuzumab D1 q21d x 8 cycles / Trastuzumab D1 + Pertuzumab D1 q21d x 4 cycles        INTERVAL HISTORY:  Martha [name] is here for a follow up of left breast cancer and Right   . She was last seen by me on 11/17/2022. She presents to the clinic accompanied by husband. Pt reports denies  having neck pain, just stiff she states. Pt state she lays in the bed a lot. Pt denies having any problems with treatment. Pt still does physical therapy at home.     All other systems were reviewed with the patient and are negative.  MEDICAL  HISTORY:  Past Medical History:  Diagnosis Date   Anxiety    Cancer 2022   right breast LCIS   Cancer 2019   left breast   Depression    Dysrhythmia    SVT, s/p ablation ~ 2012 in at [facility]   Ectopic pregnancy    Family history of breast cancer    Family history of melanoma    History of radiation therapy 04/22/18-06/03/18   Left Breast, left SCV, axilla 50 Gy in 25 fractions, Left breast boost 10 Gy in 5 fractions.    Hypothyroidism    Personal history of radiation therapy    PONV (postoperative nausea and vomiting)    Thyroid disease     SURGICAL HISTORY: Past Surgical History:  Procedure Laterality Date   APPENDECTOMY     BILATERAL SALPINGECTOMY     BREAST BIOPSY Right 2014   fibroadenoma   BREAST BIOPSY Left 01/16/2018   BREAST BIOPSY Right 02/02/2020   LCIS   BREAST BIOPSY Right 08/04/2020   x2 LCIS   BREAST BIOPSY Right 08/13/2020   x2   BREAST EXCISIONAL BIOPSY Left 1990   benign   BREAST EXCISIONAL BIOPSY Right 02/02/2020   LCIS   BREAST LUMPECTOMY Left 01/22/2018   BREAST LUMPECTOMY WITH AXILLARY LYMPH NODE BIOPSY Left 02/21/2018   Procedure: LEFT BREAST LUMPECTOMY WITH AXILLARY LYMPH NODE BIOPSY;  Surgeon: [name], MD;  Location: [place];  Service: General;  Laterality: Left;   BREAST LUMPECTOMY WITH RADIOACTIVE SEED LOCALIZATION Right 03/18/2020   Procedure: RIGHT BREAST LUMPECTOMY WITH RADIOACTIVE SEED LOCALIZATION;  Surgeon: [name], MD;  Location: [place];  Service: General;  Laterality: Right;   BREAST SURGERY Left 1993   cyst removed   ENDOMETRIAL ABLATION     EYE SURGERY     GLAUCOMA SURGERY Bilateral    IR IMAGING GUIDED PORT INSERTION  10/16/2022   POSTERIOR CERVICAL FUSION/FORAMINOTOMY N/A 08/17/2022   Procedure: Cervical One-Cervical Four POSTERIOR CERVICAL FUSION,  Cervical One LAMINECTOMY REDUCTION OF Cervical Two Fracture, Biopsy of Right Cerical Two Pars  Lesion;  Surgeon: Vallarie Mare, MD;  Location: [place];   Service: Neurosurgery;  Laterality: N/A;   POSTERIOR CERVICAL FUSION/FORAMINOTOMY N/A 09/06/2022   Procedure: Posterior Cervical Fusion, Foraminotomy , Cervical Five-Six, Cervical Six-Seven; Extension of  fusion Cervical Four- Thoracic One;  Surgeon: Vallarie Mare, MD;  Location: [place];  Service: Neurosurgery;  Laterality: N/A;   RADIOACTIVE SEED GUIDED EXCISIONAL BREAST BIOPSY Right 04/28/2021   Procedure: RADIOACTIVE SEED GUIDED EXCISIONAL RIGHT BREAST BIOPSY X2;  Surgeon: [name], MD;  Location: [place];  Service: General;  Laterality: Right;   RE-EXCISION OF BREAST LUMPECTOMY Left 03/20/2018   Procedure: RE-EXCISION OF BREAST LUMPECTOMY;  Surgeon: [name], MD;  Location: [place];  Service: General;  Laterality: Left;    I have reviewed the social history and family history with the patient and they are unchanged from previous note.  ALLERGIES:  is allergic to morphine and related.  MEDICATIONS:  Current Outpatient Medications  Medication Sig Dispense Refill   acetaminophen (TYLENOL) 500 MG tablet Take 2 tablets (1,000 mg total) by mouth every 8 (eight) hours. 30 tablet 0  citalopram (CELEXA) 40 MG tablet Take 1 tablet (40 mg total) by mouth daily. 30 tablet 3   levothyroxine (SYNTHROID) 75 MCG tablet Take 1 tablet (75 mcg total) by mouth daily before breakfast. 30 tablet 0   lidocaine (XYLOCAINE) 2 % solution Patient: Mix 1part 2% viscous lidocaine, 1part H20. Swallow 65mL of diluted mixture, 85min before meals and at bedtime, up to QID 100 mL 3   lidocaine-prilocaine (EMLA) cream Apply to affected area once (Patient taking differently: Apply 1 Application topically daily as needed (burning).) 30 g 3   LORazepam (ATIVAN) 0.5 MG tablet Take 1 tablet (0.5 mg total) by mouth every 6 (six) hours as needed for anxiety (agitation). 30 tablet 0   Mouthwashes (MOUTH RINSE) LIQD solution 15 mLs by Mouth Rinse route as needed (for oral care).  0    ondansetron (ZOFRAN) 8 MG tablet Take 1 tablet (8 mg total) by mouth every 8 (eight) hours as needed for nausea or vomiting. 20 tablet 1   OVER THE COUNTER MEDICATION Take 1 tablet by mouth daily. Protandim Supplement     oxyCODONE (OXY IR/ROXICODONE) 5 MG immediate release tablet Take 0.5-1 tablets (2.5-5 mg total) by mouth every 4 (four) hours as needed for moderate pain or severe pain (dyspnea). 30 tablet 0   PARoxetine (PAXIL) 20 MG tablet Take 20 mg by mouth daily.     pregabalin (LYRICA) 25 MG capsule Take 1 capsule (25 mg total) by mouth 2 (two) times daily. 60 capsule 2   senna-docusate (SENOKOT-S) 8.6-50 MG tablet Take 1 tablet by mouth 2 (two) times daily. 60 tablet 0   No current facility-administered medications for this visit.    PHYSICAL EXAMINATION: ECOG PERFORMANCE STATUS: 3 - Symptomatic, >50% confined to bed  Vitals:   11/30/22 0836  BP: 114/71  Pulse: 96  Resp: 18  Temp: 97.7 F (36.5 C)  SpO2: 100%   Wt Readings from Last 3 Encounters:  11/30/22 75 lb 11.2 oz (34.3 kg)  11/24/22 77 lb 11.2 oz (35.2 kg)  11/17/22 76 lb 12.8 oz (34.8 kg)     LUNGS:(-) clear to auscultation and percussion with normal breathing effort  LABORATORY DATA:  I have reviewed the data as listed    Latest Ref Rng & Units 11/30/2022    8:18 AM 11/24/2022   12:47 PM 11/17/2022    9:34 AM  CBC  WBC 4.0 - 10.5 K/uL 3.2  4.5  3.9   Hemoglobin 12.0 - 15.0 g/dL 10.8  10.6  10.6   Hematocrit 36.0 - 46.0 % 31.4  31.4  31.1   Platelets 150 - 400 K/uL 284  282  324         Latest Ref Rng & Units 11/24/2022   12:47 PM 11/17/2022    9:34 AM 11/09/2022    1:10 PM  CMP  Glucose 70 - 99 mg/dL 108  131  143   BUN 8 - 23 mg/dL 13  8  12    Creatinine 0.44 - 1.00 mg/dL <0.30  <0.30  <0.30   Sodium 135 - 145 mmol/L 133  135  135   Potassium 3.5 - 5.1 mmol/L 4.1  3.7  4.0   Chloride 98 - 111 mmol/L 100  101  100   CO2 22 - 32 mmol/L 30  29  30    Calcium 8.9 - 10.3 mg/dL 8.7  8.3  8.7   Total  Protein 6.5 - 8.1 g/dL 6.0  6.1  6.3   Total Bilirubin  0.3 - 1.2 mg/dL 0.6  0.7  0.8   Alkaline Phos 38 - 126 U/L 956  1,774  2,373   AST 15 - 41 U/L 22  22  34   ALT 0 - 44 U/L 18  20  30        RADIOGRAPHIC STUDIES: I have personally reviewed the radiological images as listed and agreed with the findings in the report. No results found.    No orders of the defined types were placed in this encounter.  All questions were answered. The patient knows to call the clinic with any problems, questions or concerns. No barriers to learning was detected. The total time spent in the appointment was 30 minutes.     Truitt Merle, MD 11/30/2022   Felicity Coyer am acting as scribe for Truitt Merle, MD.   I have reviewed the above documentation for accuracy and completeness, and I agree with the above.

## 2022-11-29 NOTE — Assessment & Plan Note (Signed)
-  She is on low-dose oxycodone, she has developed a significant pain in the right rib cage, probably related to her bone metastasis  -f/u with palliative care clinic NP Nikki  

## 2022-11-29 NOTE — Assessment & Plan Note (Signed)
--  She was diagnosed in 12/2017. She is s/p left breast lumpectomy and adjuvant radiation.  -She started anti-estrogen therapy with letrozole on 05/2018. Tolerating well with no issues -lost f/u after visit in 04/2020 until her recurrence in 07/2022  

## 2022-11-29 NOTE — Telephone Encounter (Signed)
Reached out to patient to schedule per WQ left voicemail. 

## 2022-11-29 NOTE — Assessment & Plan Note (Signed)
-  Diagnosed in 06/2022 --PET scan from 08/03/2022 showed diffuse bone mets  -she underwent cervical laminectomy and fusion by Dr. Thomas on 08/17/2022, biopsy confirmed metastatic breast cancer, ER/PR negative, HER2 positive. -due to cervical cord compression with myelopathy, she underwent a second cervical spine surgery on September 06, 2022, and completed inpatient rehabitation. -She has completed brain, cervical and lumbar spine radiation in Feb 2024 -She was recently hospitalized for altered mental status and confusion, probably secondary to medication -She started first line systemic chemotherapy with weekly Taxol and trastuzumab/Perjeta on 10/25/22. She has tolerated well so far and her pain has improved overall  

## 2022-11-30 ENCOUNTER — Encounter: Payer: Self-pay | Admitting: Hematology

## 2022-11-30 ENCOUNTER — Inpatient Hospital Stay: Payer: BC Managed Care – PPO | Attending: Genetic Counselor

## 2022-11-30 ENCOUNTER — Inpatient Hospital Stay (HOSPITAL_BASED_OUTPATIENT_CLINIC_OR_DEPARTMENT_OTHER): Payer: BC Managed Care – PPO | Admitting: Hematology

## 2022-11-30 ENCOUNTER — Inpatient Hospital Stay: Payer: BC Managed Care – PPO

## 2022-11-30 ENCOUNTER — Other Ambulatory Visit: Payer: Self-pay

## 2022-11-30 VITALS — BP 114/71 | HR 96 | Temp 97.7°F | Resp 18 | Ht 63.0 in | Wt 75.7 lb

## 2022-11-30 VITALS — BP 119/78 | HR 85 | Temp 98.0°F | Resp 16

## 2022-11-30 DIAGNOSIS — Z79899 Other long term (current) drug therapy: Secondary | ICD-10-CM | POA: Diagnosis not present

## 2022-11-30 DIAGNOSIS — C7951 Secondary malignant neoplasm of bone: Secondary | ICD-10-CM | POA: Insufficient documentation

## 2022-11-30 DIAGNOSIS — C50312 Malignant neoplasm of lower-inner quadrant of left female breast: Secondary | ICD-10-CM | POA: Insufficient documentation

## 2022-11-30 DIAGNOSIS — G893 Neoplasm related pain (acute) (chronic): Secondary | ICD-10-CM | POA: Diagnosis not present

## 2022-11-30 DIAGNOSIS — Z95828 Presence of other vascular implants and grafts: Secondary | ICD-10-CM

## 2022-11-30 DIAGNOSIS — C7931 Secondary malignant neoplasm of brain: Secondary | ICD-10-CM | POA: Insufficient documentation

## 2022-11-30 DIAGNOSIS — Z171 Estrogen receptor negative status [ER-]: Secondary | ICD-10-CM | POA: Insufficient documentation

## 2022-11-30 DIAGNOSIS — Z5112 Encounter for antineoplastic immunotherapy: Secondary | ICD-10-CM | POA: Diagnosis not present

## 2022-11-30 DIAGNOSIS — C50911 Malignant neoplasm of unspecified site of right female breast: Secondary | ICD-10-CM | POA: Insufficient documentation

## 2022-11-30 DIAGNOSIS — Z170 Estrogen receptor positive status [ER+]: Secondary | ICD-10-CM | POA: Diagnosis not present

## 2022-11-30 LAB — CBC WITH DIFFERENTIAL (CANCER CENTER ONLY)
Abs Immature Granulocytes: 0.05 K/uL (ref 0.00–0.07)
Basophils Absolute: 0 K/uL (ref 0.0–0.1)
Basophils Relative: 1 %
Eosinophils Absolute: 0 K/uL (ref 0.0–0.5)
Eosinophils Relative: 1 %
HCT: 31.4 % — ABNORMAL LOW (ref 36.0–46.0)
Hemoglobin: 10.8 g/dL — ABNORMAL LOW (ref 12.0–15.0)
Immature Granulocytes: 2 %
Lymphocytes Relative: 24 %
Lymphs Abs: 0.8 K/uL (ref 0.7–4.0)
MCH: 33.9 pg (ref 26.0–34.0)
MCHC: 34.4 g/dL (ref 30.0–36.0)
MCV: 98.4 fL (ref 80.0–100.0)
Monocytes Absolute: 0.2 K/uL (ref 0.1–1.0)
Monocytes Relative: 5 %
Neutro Abs: 2.2 K/uL (ref 1.7–7.7)
Neutrophils Relative %: 67 %
Platelet Count: 284 K/uL (ref 150–400)
RBC: 3.19 MIL/uL — ABNORMAL LOW (ref 3.87–5.11)
RDW: 18.4 % — ABNORMAL HIGH (ref 11.5–15.5)
WBC Count: 3.2 K/uL — ABNORMAL LOW (ref 4.0–10.5)
nRBC: 0 % (ref 0.0–0.2)

## 2022-11-30 LAB — CMP (CANCER CENTER ONLY)
ALT: 21 U/L (ref 0–44)
AST: 20 U/L (ref 15–41)
Albumin: 3.9 g/dL (ref 3.5–5.0)
Alkaline Phosphatase: 550 U/L — ABNORMAL HIGH (ref 38–126)
Anion gap: 4 — ABNORMAL LOW (ref 5–15)
BUN: 12 mg/dL (ref 8–23)
CO2: 29 mmol/L (ref 22–32)
Calcium: 9.1 mg/dL (ref 8.9–10.3)
Chloride: 99 mmol/L (ref 98–111)
Creatinine: <0.3 mg/dL — ABNORMAL LOW (ref 0.44–1.00)
Glucose, Bld: 136 mg/dL — ABNORMAL HIGH (ref 70–99)
Potassium: 4 mmol/L (ref 3.5–5.1)
Sodium: 132 mmol/L — ABNORMAL LOW (ref 135–145)
Total Bilirubin: 0.8 mg/dL (ref 0.3–1.2)
Total Protein: 6.1 g/dL — ABNORMAL LOW (ref 6.5–8.1)

## 2022-11-30 MED ORDER — SODIUM CHLORIDE 0.9% FLUSH
10.0000 mL | INTRAVENOUS | Status: DC | PRN
  Administered 2022-11-30: 10 mL

## 2022-11-30 MED ORDER — SODIUM CHLORIDE 0.9% FLUSH
10.0000 mL | Freq: Once | INTRAVENOUS | Status: AC
  Administered 2022-11-30: 10 mL

## 2022-11-30 MED ORDER — SODIUM CHLORIDE 0.9 % IV SOLN: Freq: Once | INTRAVENOUS | Status: AC

## 2022-11-30 MED ORDER — DIPHENHYDRAMINE HCL 50 MG/ML IJ SOLN
50.0000 mg | Freq: Once | INTRAMUSCULAR | Status: AC
  Administered 2022-11-30: 25 mg via INTRAVENOUS
  Filled 2022-11-30: qty 1

## 2022-11-30 MED ORDER — FAMOTIDINE IN NACL 20-0.9 MG/50ML-% IV SOLN
20.0000 mg | Freq: Once | INTRAVENOUS | Status: AC
  Administered 2022-11-30: 20 mg via INTRAVENOUS
  Filled 2022-11-30: qty 50

## 2022-11-30 MED ORDER — SODIUM CHLORIDE 0.9 % IV SOLN
10.0000 mg | Freq: Once | INTRAVENOUS | Status: AC
  Administered 2022-11-30: 10 mg via INTRAVENOUS
  Filled 2022-11-30: qty 10

## 2022-11-30 MED ORDER — SODIUM CHLORIDE 0.9 % IV SOLN
80.0000 mg/m2 | Freq: Once | INTRAVENOUS | Status: AC
  Administered 2022-11-30: 102 mg via INTRAVENOUS
  Filled 2022-11-30: qty 17

## 2022-11-30 MED ORDER — HEPARIN SOD (PORK) LOCK FLUSH 100 UNIT/ML IV SOLN
500.0000 [IU] | Freq: Once | INTRAVENOUS | Status: AC | PRN
  Administered 2022-11-30: 500 [IU]

## 2022-11-30 NOTE — Patient Instructions (Signed)
[facility]  Discharge Instructions: Thank you for choosing [facility] to provide your oncology and hematology care.   If you have a lab appointment with the Cancer Center, please go directly to the Cancer Center and check in at the registration area.   Wear comfortable clothing and clothing appropriate for easy access to any Portacath or PICC line.   We strive to give you quality time with your provider. You may need to reschedule your appointment if you arrive late (15 or more minutes).  Arriving late affects you and other patients whose appointments are after yours.  Also, if you miss three or more appointments without notifying the office, you may be dismissed from the clinic at the provider's discretion.      For prescription refill requests, have your pharmacy contact our office and allow 72 hours for refills to be completed.    Today you received the following chemotherapy and/or immunotherapy agent: Paclitaxel (Taxol)    To help prevent nausea and vomiting after your treatment, we encourage you to take your nausea medication as directed.  BELOW ARE SYMPTOMS THAT SHOULD BE REPORTED IMMEDIATELY: *FEVER GREATER THAN 100.4 F (38 C) OR HIGHER *CHILLS OR SWEATING *NAUSEA AND VOMITING THAT IS NOT CONTROLLED WITH YOUR NAUSEA MEDICATION *UNUSUAL SHORTNESS OF BREATH *UNUSUAL BRUISING OR BLEEDING *URINARY PROBLEMS (pain or burning when urinating, or frequent urination) *BOWEL PROBLEMS (unusual diarrhea, constipation, pain near the anus) TENDERNESS IN MOUTH AND THROAT WITH OR WITHOUT PRESENCE OF ULCERS (sore throat, sores in mouth, or a toothache) UNUSUAL RASH, SWELLING OR PAIN  UNUSUAL VAGINAL DISCHARGE OR ITCHING   Items with * indicate a potential emergency and should be followed up as soon as possible or go to the Emergency Department if any problems should occur.  Please show the CHEMOTHERAPY ALERT CARD or IMMUNOTHERAPY ALERT CARD at  check-in to the Emergency Department and triage nurse.  Should you have questions after your visit or need to cancel or reschedule your appointment, please contact [facility]  Dept: [phone]  and follow the prompts.  Office hours are 8:00 a.m. to 4:30 p.m. Monday - Friday. Please note that voicemails left after 4:00 p.m. may not be returned until the following business day.  We are closed weekends and major holidays. You have access to a nurse at all times for urgent questions. Please call the main number to the clinic Dept: [phone] and follow the prompts.   For any non-urgent questions, you may also contact your provider using MyChart. We now offer e-Visits for anyone 18 and older to request care online for non-urgent symptoms. For details visit [email or web address].   Also download the MyChart app! Go to the app store, search "MyChart", open the app, select , and log in with your MyChart username and password.  Paclitaxel Injection What is this medication? PACLITAXEL (PAK li TAX el) treats some types of cancer. It works by slowing down the growth of cancer cells. This medicine may be used for other purposes; ask your health care provider or pharmacist if you have questions. COMMON BRAND NAME(S): Onxol, Taxol What should I tell my care team before I take this medication? They need to know if you have any of these conditions: Heart disease Liver disease Low white blood cell levels An unusual or allergic reaction to paclitaxel, other medications, foods, dyes, or preservatives If you or your partner are pregnant or trying to get pregnant   Breast-feeding How should I use this medication? This medication is injected into a vein. It is given by your care team in a hospital or clinic setting. Talk to your care team about the use of this medication in children. While it may be given to children for selected conditions, precautions do  apply. Overdosage: If you think you have taken too much of this medicine contact a poison control center or emergency room at once. NOTE: This medicine is only for you. Do not share this medicine with others. What if I miss a dose? Keep appointments for follow-up doses. It is important not to miss your dose. Call your care team if you are unable to keep an appointment. What may interact with this medication? Do not take this medication with any of the following: Live virus vaccines Other medications may affect the way this medication works. Talk with your care team about all of the medications you take. They may suggest changes to your treatment plan to lower the risk of side effects and to make sure your medications work as intended. This list may not describe all possible interactions. Give your health care provider a list of all the medicines, herbs, non-prescription drugs, or dietary supplements you use. Also tell them if you smoke, drink alcohol, or use illegal drugs. Some items may interact with your medicine. What should I watch for while using this medication? Your condition will be monitored carefully while you are receiving this medication. You may need blood work while taking this medication. This medication may make you feel generally unwell. This is not uncommon as chemotherapy can affect healthy cells as well as cancer cells. Report any side effects. Continue your course of treatment even though you feel ill unless your care team tells you to stop. This medication can cause serious allergic reactions. To reduce the risk, your care team may give you other medications to take before receiving this one. Be sure to follow the directions from your care team. This medication may increase your risk of getting an infection. Call your care team for advice if you get a fever, chills, sore throat, or other symptoms of a cold or flu. Do not treat yourself. Try to avoid being around people who are  sick. This medication may increase your risk to bruise or bleed. Call your care team if you notice any unusual bleeding. Be careful brushing or flossing your teeth or using a toothpick because you may get an infection or bleed more easily. If you have any dental work done, tell your dentist you are receiving this medication. Talk to your care team if you may be pregnant. Serious birth defects can occur if you take this medication during pregnancy. Talk to your care team before breastfeeding. Changes to your treatment plan may be needed. What side effects may I notice from receiving this medication? Side effects that you should report to your care team as soon as possible: Allergic reactions--skin rash, itching, hives, swelling of the face, lips, tongue, or throat Heart rhythm changes--fast or irregular heartbeat, dizziness, feeling faint or lightheaded, chest pain, trouble breathing Increase in blood pressure Infection--fever, chills, cough, sore throat, wounds that don't heal, pain or trouble when passing urine, general feeling of discomfort or being unwell Low blood pressure--dizziness, feeling faint or lightheaded, blurry vision Low red blood cell level--unusual weakness or fatigue, dizziness, headache, trouble breathing Painful swelling, warmth, or redness of the skin, blisters or sores at the infusion site Pain, tingling, or numbness in the   hands or feet Slow heartbeat--dizziness, feeling faint or lightheaded, confusion, trouble breathing, unusual weakness or fatigue Unusual bruising or bleeding Side effects that usually do not require medical attention (report to your care team if they continue or are bothersome): Diarrhea Hair loss Joint pain Loss of appetite Muscle pain Nausea Vomiting This list may not describe all possible side effects. Call your doctor for medical advice about side effects. You may report side effects to FDA at 1-800-FDA-1088. Where should I keep my  medication? This medication is given in a hospital or clinic. It will not be stored at home. NOTE: This sheet is a summary. It may not cover all possible information. If you have questions about this medicine, talk to your doctor, pharmacist, or health care provider.  2023 Elsevier/Gold Standard (2021-12-29 00:00:00)   

## 2022-11-30 NOTE — Addendum Note (Signed)
Addended by: Truitt Merle on: 11/30/2022 10:33 AM   Modules accepted: Orders

## 2022-12-05 DIAGNOSIS — F32A Depression, unspecified: Secondary | ICD-10-CM | POA: Diagnosis not present

## 2022-12-05 DIAGNOSIS — Z170 Estrogen receptor positive status [ER+]: Secondary | ICD-10-CM | POA: Diagnosis not present

## 2022-12-05 DIAGNOSIS — C50312 Malignant neoplasm of lower-inner quadrant of left female breast: Secondary | ICD-10-CM | POA: Diagnosis not present

## 2022-12-05 DIAGNOSIS — Z9012 Acquired absence of left breast and nipple: Secondary | ICD-10-CM | POA: Diagnosis not present

## 2022-12-05 DIAGNOSIS — Z87891 Personal history of nicotine dependence: Secondary | ICD-10-CM | POA: Diagnosis not present

## 2022-12-05 DIAGNOSIS — E43 Unspecified severe protein-calorie malnutrition: Secondary | ICD-10-CM | POA: Diagnosis not present

## 2022-12-05 DIAGNOSIS — G893 Neoplasm related pain (acute) (chronic): Secondary | ICD-10-CM | POA: Diagnosis not present

## 2022-12-05 DIAGNOSIS — E876 Hypokalemia: Secondary | ICD-10-CM | POA: Diagnosis not present

## 2022-12-05 DIAGNOSIS — F419 Anxiety disorder, unspecified: Secondary | ICD-10-CM | POA: Diagnosis not present

## 2022-12-05 DIAGNOSIS — Z7901 Long term (current) use of anticoagulants: Secondary | ICD-10-CM | POA: Diagnosis not present

## 2022-12-05 DIAGNOSIS — Z79891 Long term (current) use of opiate analgesic: Secondary | ICD-10-CM | POA: Diagnosis not present

## 2022-12-05 DIAGNOSIS — M542 Cervicalgia: Secondary | ICD-10-CM | POA: Diagnosis not present

## 2022-12-05 DIAGNOSIS — E039 Hypothyroidism, unspecified: Secondary | ICD-10-CM | POA: Diagnosis not present

## 2022-12-05 DIAGNOSIS — C7951 Secondary malignant neoplasm of bone: Secondary | ICD-10-CM | POA: Diagnosis not present

## 2022-12-05 DIAGNOSIS — Z981 Arthrodesis status: Secondary | ICD-10-CM | POA: Diagnosis not present

## 2022-12-06 MED FILL — Dexamethasone Sodium Phosphate Inj 100 MG/10ML: INTRAMUSCULAR | Qty: 1 | Status: AC

## 2022-12-06 NOTE — Progress Notes (Deleted)
Palliative Medicine [facility]  [phone] [phone]) [phone]   Name: Martha [name] Date: 12/06/2022 MRN: [id number]  DOB: 1961/04/13  Patient Care Team: Ardith Dark, MD as PCP - General (Family Medicine) Almond Lint, MD as Consulting Physician (General Surgery) Malachy Mood, MD as Consulting Physician (Hematology) Lonie Peak, MD as Attending Physician (Radiation Oncology) Pollyann Samples, NP as Nurse Practitioner (Nurse Practitioner)    INTERVAL HISTORY: Martha [name] is a 62 y.o. female with oncologic medical history including ER+ breast cancer (01/2018), right breast cancer (04/2021), now with metastatic disease progression involving brain and liver, pathological fractures with osseous mets s/p brain radiation. .  Palliative ask to see for symptom management and goals of care.   SOCIAL HISTORY:     reports that she has been smoking cigarettes. She has a 20.00 pack-year smoking history. She has never used smokeless tobacco. She reports that she does not currently use alcohol after a past usage of about 28.0 standard drinks of alcohol per week. She reports that she does not use drugs.  ADVANCE DIRECTIVES:  Has documents on file  CODE STATUS: DNR  PAST MEDICAL HISTORY: Past Medical History:  Diagnosis Date   Anxiety    Cancer 2022   right breast LCIS   Cancer 2019   left breast   Depression    Dysrhythmia    SVT, s/p ablation ~ 2012 in at [facility]   Ectopic pregnancy    Family history of breast cancer    Family history of melanoma    History of radiation therapy 04/22/18-06/03/18   Left Breast, left SCV, axilla 50 Gy in 25 fractions, Left breast boost 10 Gy in 5 fractions.    Hypothyroidism    Personal history of radiation therapy    PONV (postoperative nausea and vomiting)    Thyroid disease     ALLERGIES:  is allergic to morphine and related.  MEDICATIONS:  Current Outpatient Medications   Medication Sig Dispense Refill   acetaminophen (TYLENOL) 500 MG tablet Take 2 tablets (1,000 mg total) by mouth every 8 (eight) hours. 30 tablet 0   citalopram (CELEXA) 40 MG tablet Take 1 tablet (40 mg total) by mouth daily. 30 tablet 3   levothyroxine (SYNTHROID) 75 MCG tablet Take 1 tablet (75 mcg total) by mouth daily before breakfast. 30 tablet 0   lidocaine (XYLOCAINE) 2 % solution Patient: Mix 1part 2% viscous lidocaine, 1part H20. Swallow 62mL of diluted mixture, before meals and at bedtime, up to QID 100 mL 3   lidocaine-prilocaine (EMLA) cream Apply to affected area once (Patient taking differently: Apply 1 Application topically daily as needed (burning).) 30 g 3   LORazepam (ATIVAN) 0.5 MG tablet Take 1 tablet (0.5 mg total) by mouth every 6 (six) hours as needed for anxiety (agitation). 30 tablet 0   Mouthwashes (MOUTH RINSE) LIQD solution 15 mLs by Mouth Rinse route as needed (for oral care).  0   ondansetron (ZOFRAN) 8 MG tablet Take 1 tablet (8 mg total) by mouth every 8 (eight) hours as needed for nausea or vomiting. 20 tablet 1   OVER THE COUNTER MEDICATION Take 1 tablet by mouth daily. Protandim Supplement     oxyCODONE (OXY IR/ROXICODONE) 5 MG immediate release tablet Take 0.5-1 tablets (2.5-5 mg total) by mouth every 4 (four) hours as needed for moderate pain or severe pain (dyspnea). 30 tablet 0   PARoxetine (PAXIL) 20 MG tablet Take 20 mg by  mouth daily.     pregabalin (LYRICA) 25 MG capsule Take 1 capsule (25 mg total) by mouth 2 (two) times daily. 60 capsule 2   senna-docusate (SENOKOT-S) 8.6-50 MG tablet Take 1 tablet by mouth 2 (two) times daily. 60 tablet 0   No current facility-administered medications for this visit.    VITAL SIGNS: There were no vitals taken for this visit. There were no vitals filed for this visit.  Estimated body mass index is 13.41 kg/m as calculated from the following:   Height as of 11/30/22: 5\' 3"  (1.6 m).   Weight as of 11/30/22: 75 lb  11.2 oz (34.3 kg).   PERFORMANCE STATUS (ECOG) : 3 - Symptomatic, >50% confined to bed   Physical Exam General: NAD, cachectic, in wheelchair  Cardiovascular: regular rate and rhythm Pulmonary: clear ant fields Abdomen: soft, nontender, + bowel sounds Extremities: no edema, no joint deformities Skin: no rashes, muscle wasting  Neurological: AAO x5   IMPRESSION:   Neoplasm related pain [name] reports pain is well-controlled. Feels the increase in her Lyrica (adding night time dose) has significantly allowed for some of her improvement. Does continue to have pain and discomfort with movement however feels it is manageable.  She was able to stand up for me and allow me to assess her bottom without difficulty but did require standby assistance.   We discussed her current pain regimen.  She is taking 2.5-5 mg of oxycodone IR as needed for pain.  Lyrica 25 mg twice daily.  Tolerating medications without difficulty.  Given sensitivity to recent pain medication changes in the past would be reluctant to make any significant adjustments to her medications out of fear of confusion and hallucinations.    We will continue to closely monitor and adjust as needed.  Patient is having some sensitivity to her sacral area.  Education provided on due to muscle wasting and thinness this can be causing some irritation to the bony areas.  Assessment to sacral area with no findings of open wounds.  Some redness and irritation.  Education provided on using barrier cream.  Patient also provided with supportive dressing to allow additional cushioning.  Discussed sitting on egg crate cushion or donut pillow when sitting for long periods of time and paying close attention to shifting weight every 1-2 hours.   Decreased appetite/weight loss   3. Goals of care  10/26/22-We discussed her current illness and what it means in the larger context of Her on-going co-morbidities. Natural disease trajectory and  expectations were discussed.   Ms. Martha [name] and her husband are realistic in their understanding.  They are clear on expressed wishes to continue to treat the treatable allow her every opportunity to continue to thrive.  She acknowledges at some point her health will continue to decline requiring her to focus more on her comfort and foregoing treatments.  She asked appropriate questions of appropriateness of discontinuing treatments.  She is not quite ready to make these decisions and would like to take things one day at a time.   Extensive education provided on the differences between palliative and hospice services.  We discussed what both entities would look like in the home/outpatient.  Patient and husband verbalized understanding and appreciation.   I empathetically approach discussions regarding advanced directives and code status.  Patient has a documented advanced directive that was recently completed.  Her husband Martha [name] is her primary medical decision-maker and her Sister Martha [name] is secondary. Patient confirms wishes for DNR/DNI.  Out  of facility form has been completed.    Education provided on the MOLST form.  As requested form was completed today.  We discussed Her current illness and what it means in the larger context of Her on-going co-morbidities. Natural disease trajectory and expectations were discussed.  I discussed the importance of continued conversation with family and their medical providers regarding overall plan of care and treatment options, ensuring decisions are within the context of the patients values and GOCs.  PLAN: DNR/DNI/MOST form on file Continue oxycodone as needed for pain Lyrica 25 mg twice daily (tolerating) Protective skin barrier to sacral area. Ongoing goals of care discussions and symptom management I will plan to see patient back in 2-4 weeks in collaboration to other oncology appointments.  Patient and husband knows to contact office sooner if  needed.   Patient expressed understanding and was in agreement with this plan. She also understands that She can call the clinic at any time with any questions, concerns, or complaints.      Any controlled substances utilized were prescribed in the context of palliative care. PDMP has been reviewed.    Time Total: 20 min   Visit consisted of counseling and education dealing with the complex and emotionally intense issues of symptom management and palliative care in the setting of serious and potentially life-threatening illness.Greater than 50%  of this time was spent counseling and coordinating care related to the above assessment and plan.  Willette Alma, AGPCNP-BC  Palliative Medicine Team/[facility]    *Please note that this is a verbal dictation therefore any spelling or grammatical errors are due to the "Dragon [employer] One" system interpretation.

## 2022-12-07 ENCOUNTER — Other Ambulatory Visit: Payer: Self-pay

## 2022-12-07 ENCOUNTER — Inpatient Hospital Stay: Payer: BC Managed Care – PPO

## 2022-12-07 ENCOUNTER — Ambulatory Visit: Payer: BC Managed Care – PPO | Admitting: Hematology

## 2022-12-07 VITALS — BP 121/79 | HR 78 | Temp 97.9°F | Resp 18 | Wt 74.8 lb

## 2022-12-07 DIAGNOSIS — Z170 Estrogen receptor positive status [ER+]: Secondary | ICD-10-CM | POA: Diagnosis not present

## 2022-12-07 DIAGNOSIS — C50911 Malignant neoplasm of unspecified site of right female breast: Secondary | ICD-10-CM | POA: Diagnosis not present

## 2022-12-07 DIAGNOSIS — C7951 Secondary malignant neoplasm of bone: Secondary | ICD-10-CM

## 2022-12-07 DIAGNOSIS — Z5112 Encounter for antineoplastic immunotherapy: Secondary | ICD-10-CM | POA: Diagnosis not present

## 2022-12-07 DIAGNOSIS — C7931 Secondary malignant neoplasm of brain: Secondary | ICD-10-CM | POA: Diagnosis not present

## 2022-12-07 DIAGNOSIS — G893 Neoplasm related pain (acute) (chronic): Secondary | ICD-10-CM | POA: Diagnosis not present

## 2022-12-07 DIAGNOSIS — C50312 Malignant neoplasm of lower-inner quadrant of left female breast: Secondary | ICD-10-CM | POA: Diagnosis not present

## 2022-12-07 DIAGNOSIS — Z79899 Other long term (current) drug therapy: Secondary | ICD-10-CM | POA: Diagnosis not present

## 2022-12-07 DIAGNOSIS — Z95828 Presence of other vascular implants and grafts: Secondary | ICD-10-CM

## 2022-12-07 DIAGNOSIS — Z171 Estrogen receptor negative status [ER-]: Secondary | ICD-10-CM | POA: Diagnosis not present

## 2022-12-07 LAB — CMP (CANCER CENTER ONLY)
ALT: 11 U/L (ref 0–44)
AST: 14 U/L — ABNORMAL LOW (ref 15–41)
Albumin: 4 g/dL (ref 3.5–5.0)
Alkaline Phosphatase: 335 U/L — ABNORMAL HIGH (ref 38–126)
Anion gap: 6 (ref 5–15)
BUN: 7 mg/dL — ABNORMAL LOW (ref 8–23)
CO2: 29 mmol/L (ref 22–32)
Calcium: 8.7 mg/dL — ABNORMAL LOW (ref 8.9–10.3)
Chloride: 102 mmol/L (ref 98–111)
Creatinine: <0.3 mg/dL — ABNORMAL LOW (ref 0.44–1.00)
Glucose, Bld: 119 mg/dL — ABNORMAL HIGH (ref 70–99)
Potassium: 3.2 mmol/L — ABNORMAL LOW (ref 3.5–5.1)
Sodium: 137 mmol/L (ref 135–145)
Total Bilirubin: 0.6 mg/dL (ref 0.3–1.2)
Total Protein: 6.2 g/dL — ABNORMAL LOW (ref 6.5–8.1)

## 2022-12-07 LAB — CBC WITH DIFFERENTIAL (CANCER CENTER ONLY)
Abs Immature Granulocytes: 0.02 10*3/uL (ref 0.00–0.07)
Basophils Absolute: 0 10*3/uL (ref 0.0–0.1)
Basophils Relative: 0 %
Eosinophils Absolute: 0 10*3/uL (ref 0.0–0.5)
Eosinophils Relative: 1 %
HCT: 33.2 % — ABNORMAL LOW (ref 36.0–46.0)
Hemoglobin: 11.4 g/dL — ABNORMAL LOW (ref 12.0–15.0)
Immature Granulocytes: 1 %
Lymphocytes Relative: 18 %
Lymphs Abs: 0.7 10*3/uL (ref 0.7–4.0)
MCH: 34.2 pg — ABNORMAL HIGH (ref 26.0–34.0)
MCHC: 34.3 g/dL (ref 30.0–36.0)
MCV: 99.7 fL (ref 80.0–100.0)
Monocytes Absolute: 0.3 10*3/uL (ref 0.1–1.0)
Monocytes Relative: 7 %
Neutro Abs: 2.9 10*3/uL (ref 1.7–7.7)
Neutrophils Relative %: 73 %
Platelet Count: 374 10*3/uL (ref 150–400)
RBC: 3.33 MIL/uL — ABNORMAL LOW (ref 3.87–5.11)
RDW: 18.6 % — ABNORMAL HIGH (ref 11.5–15.5)
WBC Count: 3.9 10*3/uL — ABNORMAL LOW (ref 4.0–10.5)
nRBC: 0 % (ref 0.0–0.2)

## 2022-12-07 MED ORDER — SODIUM CHLORIDE 0.9 % IV SOLN
420.0000 mg | Freq: Once | INTRAVENOUS | Status: AC
  Administered 2022-12-07: 420 mg via INTRAVENOUS
  Filled 2022-12-07: qty 14

## 2022-12-07 MED ORDER — SODIUM CHLORIDE 0.9 % IV SOLN
10.0000 mg | Freq: Once | INTRAVENOUS | Status: AC
  Administered 2022-12-07: 10 mg via INTRAVENOUS
  Filled 2022-12-07: qty 10

## 2022-12-07 MED ORDER — HEPARIN SOD (PORK) LOCK FLUSH 100 UNIT/ML IV SOLN
500.0000 [IU] | Freq: Once | INTRAVENOUS | Status: AC | PRN
  Administered 2022-12-07: 500 [IU]

## 2022-12-07 MED ORDER — TRASTUZUMAB-DKST CHEMO 150 MG IV SOLR
6.0000 mg/kg | Freq: Once | INTRAVENOUS | Status: AC
  Administered 2022-12-07: 210 mg via INTRAVENOUS
  Filled 2022-12-07: qty 10

## 2022-12-07 MED ORDER — SODIUM CHLORIDE 0.9 % IV SOLN
80.0000 mg/m2 | Freq: Once | INTRAVENOUS | Status: AC
  Administered 2022-12-07: 102 mg via INTRAVENOUS
  Filled 2022-12-07: qty 17

## 2022-12-07 MED ORDER — SODIUM CHLORIDE 0.9 % IV SOLN: Freq: Once | INTRAVENOUS | Status: AC

## 2022-12-07 MED ORDER — DIPHENHYDRAMINE HCL 50 MG/ML IJ SOLN
25.0000 mg | Freq: Once | INTRAMUSCULAR | Status: AC
  Administered 2022-12-07: 25 mg via INTRAVENOUS
  Filled 2022-12-07: qty 1

## 2022-12-07 MED ORDER — FAMOTIDINE IN NACL 20-0.9 MG/50ML-% IV SOLN
20.0000 mg | Freq: Once | INTRAVENOUS | Status: AC
  Administered 2022-12-07: 20 mg via INTRAVENOUS
  Filled 2022-12-07: qty 50

## 2022-12-07 MED ORDER — SODIUM CHLORIDE 0.9% FLUSH
10.0000 mL | Freq: Once | INTRAVENOUS | Status: AC
  Administered 2022-12-07: 10 mL

## 2022-12-07 MED ORDER — ACETAMINOPHEN 325 MG PO TABS
650.0000 mg | ORAL_TABLET | Freq: Once | ORAL | Status: AC
  Administered 2022-12-07: 650 mg via ORAL
  Filled 2022-12-07: qty 2

## 2022-12-07 MED ORDER — SODIUM CHLORIDE 0.9% FLUSH
10.0000 mL | INTRAVENOUS | Status: DC | PRN
  Administered 2022-12-07: 10 mL

## 2022-12-07 NOTE — Patient Instructions (Signed)
[facility]  Discharge Instructions: Thank you for choosing [facility] to provide your oncology and hematology care.   If you have a lab appointment with the Cancer Center, please go directly to the Cancer Center and check in at the registration area.   Wear comfortable clothing and clothing appropriate for easy access to any Portacath or PICC line.   We strive to give you quality time with your provider. You may need to reschedule your appointment if you arrive late (15 or more minutes).  Arriving late affects you and other patients whose appointments are after yours.  Also, if you miss three or more appointments without notifying the office, you may be dismissed from the clinic at the provider's discretion.      For prescription refill requests, have your pharmacy contact our office and allow 72 hours for refills to be completed.    Today you received the following chemotherapy and/or immunotherapy agents: Trastuzumab, Pertuzumab, Paclitaxel.       To help prevent nausea and vomiting after your treatment, we encourage you to take your nausea medication as directed.  BELOW ARE SYMPTOMS THAT SHOULD BE REPORTED IMMEDIATELY: *FEVER GREATER THAN 100.4 F (38 C) OR HIGHER *CHILLS OR SWEATING *NAUSEA AND VOMITING THAT IS NOT CONTROLLED WITH YOUR NAUSEA MEDICATION *UNUSUAL SHORTNESS OF BREATH *UNUSUAL BRUISING OR BLEEDING *URINARY PROBLEMS (pain or burning when urinating, or frequent urination) *BOWEL PROBLEMS (unusual diarrhea, constipation, pain near the anus) TENDERNESS IN MOUTH AND THROAT WITH OR WITHOUT PRESENCE OF ULCERS (sore throat, sores in mouth, or a toothache) UNUSUAL RASH, SWELLING OR PAIN  UNUSUAL VAGINAL DISCHARGE OR ITCHING   Items with * indicate a potential emergency and should be followed up as soon as possible or go to the Emergency Department if any problems should occur.  Please show the CHEMOTHERAPY ALERT CARD or  IMMUNOTHERAPY ALERT CARD at check-in to the Emergency Department and triage nurse.  Should you have questions after your visit or need to cancel or reschedule your appointment, please contact [facility]  Dept: [phone]  and follow the prompts.  Office hours are 8:00 a.m. to 4:30 p.m. Monday - Friday. Please note that voicemails left after 4:00 p.m. may not be returned until the following business day.  We are closed weekends and major holidays. You have access to a nurse at all times for urgent questions. Please call the main number to the clinic Dept: [phone] and follow the prompts.   For any non-urgent questions, you may also contact your provider using MyChart. We now offer e-Visits for anyone 42 and older to request care online for non-urgent symptoms. For details visit [email or web address].   Also download the MyChart app! Go to the app store, search "MyChart", open the app, select [facility], and log in with your MyChart username and password.

## 2022-12-07 NOTE — Progress Notes (Signed)
Palliative Medicine [facility]  [phone] [phone]) [phone]   Name: Britny Pribnow Date: 12/07/2022 MRN: [id number]  DOB: 1961/01/10  Patient Care Team: Ardith Dark, MD as PCP - General (Family Medicine) Almond Lint, MD as Consulting Physician (General Surgery) Malachy Mood, MD as Consulting Physician (Hematology) Lonie Peak, MD as Attending Physician (Radiation Oncology) Pollyann Samples, NP as Nurse Practitioner (Nurse Practitioner)    INTERVAL HISTORY: Yuni Schwass is a 62 y.o. female with oncologic medical history including ER+ breast cancer (01/2018), right breast cancer (04/2021), now with metastatic disease progression involving brain and liver, pathological fractures with osseous mets s/p brain radiation. .  Palliative ask to see for symptom management and goals of care.   SOCIAL HISTORY:    Mrs. Ihrig reports that she has been smoking cigarettes. She has a 20.00 pack-year smoking history. She has never used smokeless tobacco. She reports that she does not currently use alcohol after a past usage of about 28.0 standard drinks of alcohol per week. She reports that she does not use drugs.  ADVANCE DIRECTIVES:  Has documents on file  CODE STATUS: DNR  PAST MEDICAL HISTORY: Past Medical History:  Diagnosis Date   Anxiety    Cancer 2022   right breast LCIS   Cancer 2019   left breast   Depression    Dysrhythmia    SVT, s/p ablation ~ 2012 in at [facility]   Ectopic pregnancy    Family history of breast cancer    Family history of melanoma    History of radiation therapy 04/22/18-06/03/18   Left Breast, left SCV, axilla 50 Gy in 25 fractions, Left breast boost 10 Gy in 5 fractions.    Hypothyroidism    Personal history of radiation therapy    PONV (postoperative nausea and vomiting)    Thyroid disease     ALLERGIES:  is allergic to morphine and related.  MEDICATIONS:  Current Outpatient  Medications  Medication Sig Dispense Refill   acetaminophen (TYLENOL) 500 MG tablet Take 2 tablets (1,000 mg total) by mouth every 8 (eight) hours. 30 tablet 0   citalopram (CELEXA) 40 MG tablet Take 1 tablet (40 mg total) by mouth daily. 30 tablet 3   levothyroxine (SYNTHROID) 75 MCG tablet Take 1 tablet (75 mcg total) by mouth daily before breakfast. 30 tablet 0   lidocaine (XYLOCAINE) 2 % solution Patient: Mix 1part 2% viscous lidocaine, 1part H20. Swallow 64mL of diluted mixture, before meals and at bedtime, up to QID 100 mL 3   lidocaine-prilocaine (EMLA) cream Apply to affected area once (Patient taking differently: Apply 1 Application topically daily as needed (burning).) 30 g 3   LORazepam (ATIVAN) 0.5 MG tablet Take 1 tablet (0.5 mg total) by mouth every 6 (six) hours as needed for anxiety (agitation). 30 tablet 0   Mouthwashes (MOUTH RINSE) LIQD solution 15 mLs by Mouth Rinse route as needed (for oral care).  0   ondansetron (ZOFRAN) 8 MG tablet Take 1 tablet (8 mg total) by mouth every 8 (eight) hours as needed for nausea or vomiting. 20 tablet 1   OVER THE COUNTER MEDICATION Take 1 tablet by mouth daily. Protandim Supplement     oxyCODONE (OXY IR/ROXICODONE) 5 MG immediate release tablet Take 0.5-1 tablets (2.5-5 mg total) by mouth every 4 (four) hours as needed for moderate pain or severe pain (dyspnea). 30 tablet 0   pregabalin (LYRICA) 25 MG capsule Take 1 capsule (  25 mg total) by mouth 2 (two) times daily. 60 capsule 2   senna-docusate (SENOKOT-S) 8.6-50 MG tablet Take 1 tablet by mouth 2 (two) times daily. 60 tablet 0   No current facility-administered medications for this visit.    VITAL SIGNS: There were no vitals taken for this visit. There were no vitals filed for this visit.  Estimated body mass index is 13.16 kg/m as calculated from the following:   Height as of an earlier encounter on 12/14/22: 5\' 3"  (1.6 m).   Weight as of an earlier encounter on 12/14/22: 33.7  kg.   PERFORMANCE STATUS (ECOG) : 3 - Symptomatic, >50% confined to bed   Physical Exam General: NAD, cachectic, in wheelchair  Cardiovascular: regular rate and rhythm Pulmonary: clear ant fields Abdomen: soft, nontender, + bowel sounds Extremities: no edema, no joint deformities Skin: no rashes, muscle wasting  Neurological: alert, oriented x 4  IMPRESSION: Present for visit are Mrs. Jewel BaizeCraven and her daughter Shawna OrleansMelanie. Patient is sitting in wheelchair for this visit. She is alert, oriented, and in good spirits but visibly exhausted. Relates being ready to lie down in infusion area after this appointment.  Decreased appetite/weight loss Mrs. Jewel BaizeCraven reports that appetite is slowly improving. On a typical day, drinks 2-3 ensures. She eats soft foods such as yogurt with granola, soup, noodles with sauce, peanut butter, and ice cream. Patient is commended for eating these soft, high-calorie foods and she plans to continue.  Tongue examined and has two very small white spots. Patient denies mouth/throat pain or difficulty swallowing. Does c/o dry mouth. Educated regarding use of Biotene for dry mouth. Educated regarding oral thrush and encouraged patient to report if she has any pain, difficulty swallowing, or notices her tongue turning white.  Denies nausea or vomiting. Endorses occasional diarrhea that is alleviated by 1-2 doses of Imodium.    Pain Mrs. Jewel BaizeCraven has long-standing neck pain that she rates 1 out of 10. The new area of sensitivity to her sacrum continues to be padded and this provides relief. She is also consciousness of repositioning from side to side in the bed at night. Reports resting very well and taking 2 naps per day.  Mrs. Jewel BaizeCraven reports pain is well-controlled. She continues to take Lyrica. Denies needing Tylenol or Oxycodone at this time. She is aware to notify palliative clinic with any changes in her pain or needs. No changes made at this time. We will continue to  closely monitor and adjust medication regimen as needed.   We discussed her current illness and what it means in the larger context of her on-going co-morbidities. Natural disease trajectory and expectations were discussed. We discussed the importance of continued conversation with family and their medical providers regarding overall plan of care and treatment options, ensuring decisions are within the context of the patients values and GOCs.  PLAN: Continue intake of Ensure and soft, high-calorie foods. Begin Biotene as needed for dry mouth. Monitor for signs/symptoms of thrush. Continue Lyrica 25 mg twice daily. Tylenol and Oxycodone available as needed for pain. Protective skin barrier to sacral area. Ongoing goals of care discussions and symptom management Palliative will plan to see patient back in 2-4 weeks in collaboration to other oncology appointments.  Patient and husband knows to contact office sooner if needed.   Patient expressed understanding and was in agreement with this plan. She also understands that She can call the clinic at any time with any questions, concerns, or complaints.  Any controlled substances utilized were prescribed in the context of palliative care. PDMP has been reviewed.   I assessed patient with Marylene Land, NP Student. Agree with above findings.   Visit consisted of counseling and education dealing with the complex and emotionally intense issues of symptom management and palliative care in the setting of serious and potentially life-threatening illness.Greater than 50%  of this time was spent counseling and coordinating care related to the above assessment and plan.  Signed by: Katy Apo, RN MSN [facility] / NP Student  Willette Alma, AGPCNP-BC  Palliative Medicine Team/[facility]

## 2022-12-11 DIAGNOSIS — F419 Anxiety disorder, unspecified: Secondary | ICD-10-CM | POA: Diagnosis not present

## 2022-12-11 DIAGNOSIS — Z9012 Acquired absence of left breast and nipple: Secondary | ICD-10-CM | POA: Diagnosis not present

## 2022-12-11 DIAGNOSIS — Z79891 Long term (current) use of opiate analgesic: Secondary | ICD-10-CM | POA: Diagnosis not present

## 2022-12-11 DIAGNOSIS — E876 Hypokalemia: Secondary | ICD-10-CM | POA: Diagnosis not present

## 2022-12-11 DIAGNOSIS — F32A Depression, unspecified: Secondary | ICD-10-CM | POA: Diagnosis not present

## 2022-12-11 DIAGNOSIS — Z87891 Personal history of nicotine dependence: Secondary | ICD-10-CM | POA: Diagnosis not present

## 2022-12-11 DIAGNOSIS — E43 Unspecified severe protein-calorie malnutrition: Secondary | ICD-10-CM | POA: Diagnosis not present

## 2022-12-11 DIAGNOSIS — Z170 Estrogen receptor positive status [ER+]: Secondary | ICD-10-CM | POA: Diagnosis not present

## 2022-12-11 DIAGNOSIS — E039 Hypothyroidism, unspecified: Secondary | ICD-10-CM | POA: Diagnosis not present

## 2022-12-11 DIAGNOSIS — Z7901 Long term (current) use of anticoagulants: Secondary | ICD-10-CM | POA: Diagnosis not present

## 2022-12-11 DIAGNOSIS — C50312 Malignant neoplasm of lower-inner quadrant of left female breast: Secondary | ICD-10-CM | POA: Diagnosis not present

## 2022-12-11 DIAGNOSIS — G893 Neoplasm related pain (acute) (chronic): Secondary | ICD-10-CM | POA: Diagnosis not present

## 2022-12-11 DIAGNOSIS — M542 Cervicalgia: Secondary | ICD-10-CM | POA: Diagnosis not present

## 2022-12-11 DIAGNOSIS — Z981 Arthrodesis status: Secondary | ICD-10-CM | POA: Diagnosis not present

## 2022-12-11 DIAGNOSIS — C7951 Secondary malignant neoplasm of bone: Secondary | ICD-10-CM | POA: Diagnosis not present

## 2022-12-12 DIAGNOSIS — Z87891 Personal history of nicotine dependence: Secondary | ICD-10-CM | POA: Diagnosis not present

## 2022-12-12 DIAGNOSIS — E876 Hypokalemia: Secondary | ICD-10-CM | POA: Diagnosis not present

## 2022-12-12 DIAGNOSIS — M542 Cervicalgia: Secondary | ICD-10-CM | POA: Diagnosis not present

## 2022-12-12 DIAGNOSIS — Z170 Estrogen receptor positive status [ER+]: Secondary | ICD-10-CM | POA: Diagnosis not present

## 2022-12-12 DIAGNOSIS — C7951 Secondary malignant neoplasm of bone: Secondary | ICD-10-CM | POA: Diagnosis not present

## 2022-12-12 DIAGNOSIS — E43 Unspecified severe protein-calorie malnutrition: Secondary | ICD-10-CM | POA: Diagnosis not present

## 2022-12-12 DIAGNOSIS — F419 Anxiety disorder, unspecified: Secondary | ICD-10-CM | POA: Diagnosis not present

## 2022-12-12 DIAGNOSIS — C50312 Malignant neoplasm of lower-inner quadrant of left female breast: Secondary | ICD-10-CM | POA: Diagnosis not present

## 2022-12-12 DIAGNOSIS — Z79891 Long term (current) use of opiate analgesic: Secondary | ICD-10-CM | POA: Diagnosis not present

## 2022-12-12 DIAGNOSIS — Z7901 Long term (current) use of anticoagulants: Secondary | ICD-10-CM | POA: Diagnosis not present

## 2022-12-12 DIAGNOSIS — G893 Neoplasm related pain (acute) (chronic): Secondary | ICD-10-CM | POA: Diagnosis not present

## 2022-12-12 DIAGNOSIS — F32A Depression, unspecified: Secondary | ICD-10-CM | POA: Diagnosis not present

## 2022-12-12 DIAGNOSIS — Z9012 Acquired absence of left breast and nipple: Secondary | ICD-10-CM | POA: Diagnosis not present

## 2022-12-12 DIAGNOSIS — Z981 Arthrodesis status: Secondary | ICD-10-CM | POA: Diagnosis not present

## 2022-12-12 DIAGNOSIS — E039 Hypothyroidism, unspecified: Secondary | ICD-10-CM | POA: Diagnosis not present

## 2022-12-13 DIAGNOSIS — Z79891 Long term (current) use of opiate analgesic: Secondary | ICD-10-CM | POA: Diagnosis not present

## 2022-12-13 DIAGNOSIS — Z170 Estrogen receptor positive status [ER+]: Secondary | ICD-10-CM | POA: Diagnosis not present

## 2022-12-13 DIAGNOSIS — E039 Hypothyroidism, unspecified: Secondary | ICD-10-CM | POA: Diagnosis not present

## 2022-12-13 DIAGNOSIS — Z9012 Acquired absence of left breast and nipple: Secondary | ICD-10-CM | POA: Diagnosis not present

## 2022-12-13 DIAGNOSIS — E876 Hypokalemia: Secondary | ICD-10-CM | POA: Diagnosis not present

## 2022-12-13 DIAGNOSIS — M542 Cervicalgia: Secondary | ICD-10-CM | POA: Diagnosis not present

## 2022-12-13 DIAGNOSIS — C50312 Malignant neoplasm of lower-inner quadrant of left female breast: Secondary | ICD-10-CM | POA: Diagnosis not present

## 2022-12-13 DIAGNOSIS — G893 Neoplasm related pain (acute) (chronic): Secondary | ICD-10-CM | POA: Diagnosis not present

## 2022-12-13 DIAGNOSIS — F419 Anxiety disorder, unspecified: Secondary | ICD-10-CM | POA: Diagnosis not present

## 2022-12-13 DIAGNOSIS — Z7901 Long term (current) use of anticoagulants: Secondary | ICD-10-CM | POA: Diagnosis not present

## 2022-12-13 DIAGNOSIS — E43 Unspecified severe protein-calorie malnutrition: Secondary | ICD-10-CM | POA: Diagnosis not present

## 2022-12-13 DIAGNOSIS — F32A Depression, unspecified: Secondary | ICD-10-CM | POA: Diagnosis not present

## 2022-12-13 DIAGNOSIS — C7951 Secondary malignant neoplasm of bone: Secondary | ICD-10-CM | POA: Diagnosis not present

## 2022-12-13 DIAGNOSIS — Z981 Arthrodesis status: Secondary | ICD-10-CM | POA: Diagnosis not present

## 2022-12-13 DIAGNOSIS — Z87891 Personal history of nicotine dependence: Secondary | ICD-10-CM | POA: Diagnosis not present

## 2022-12-13 MED FILL — Dexamethasone Sodium Phosphate Inj 100 MG/10ML: INTRAMUSCULAR | Qty: 1 | Status: AC

## 2022-12-14 ENCOUNTER — Encounter: Payer: Self-pay | Admitting: *Deleted

## 2022-12-14 ENCOUNTER — Inpatient Hospital Stay (HOSPITAL_BASED_OUTPATIENT_CLINIC_OR_DEPARTMENT_OTHER): Payer: BC Managed Care – PPO | Admitting: Hematology

## 2022-12-14 ENCOUNTER — Other Ambulatory Visit: Payer: Self-pay

## 2022-12-14 ENCOUNTER — Inpatient Hospital Stay: Payer: BC Managed Care – PPO

## 2022-12-14 ENCOUNTER — Encounter: Payer: Self-pay | Admitting: Nurse Practitioner

## 2022-12-14 ENCOUNTER — Encounter: Payer: Self-pay | Admitting: Hematology

## 2022-12-14 ENCOUNTER — Inpatient Hospital Stay (HOSPITAL_BASED_OUTPATIENT_CLINIC_OR_DEPARTMENT_OTHER): Payer: BC Managed Care – PPO | Admitting: Nurse Practitioner

## 2022-12-14 VITALS — BP 129/87 | HR 90 | Resp 17

## 2022-12-14 VITALS — BP 109/63 | HR 102 | Temp 98.4°F | Resp 18 | Ht 63.0 in | Wt 74.3 lb

## 2022-12-14 DIAGNOSIS — C7951 Secondary malignant neoplasm of bone: Secondary | ICD-10-CM | POA: Diagnosis not present

## 2022-12-14 DIAGNOSIS — Z515 Encounter for palliative care: Secondary | ICD-10-CM | POA: Diagnosis not present

## 2022-12-14 DIAGNOSIS — G893 Neoplasm related pain (acute) (chronic): Secondary | ICD-10-CM

## 2022-12-14 DIAGNOSIS — Z79899 Other long term (current) drug therapy: Secondary | ICD-10-CM | POA: Diagnosis not present

## 2022-12-14 DIAGNOSIS — K5903 Drug induced constipation: Secondary | ICD-10-CM

## 2022-12-14 DIAGNOSIS — Z170 Estrogen receptor positive status [ER+]: Secondary | ICD-10-CM

## 2022-12-14 DIAGNOSIS — C7931 Secondary malignant neoplasm of brain: Secondary | ICD-10-CM | POA: Diagnosis not present

## 2022-12-14 DIAGNOSIS — C799 Secondary malignant neoplasm of unspecified site: Secondary | ICD-10-CM | POA: Diagnosis not present

## 2022-12-14 DIAGNOSIS — R530 Neoplastic (malignant) related fatigue: Secondary | ICD-10-CM | POA: Diagnosis not present

## 2022-12-14 DIAGNOSIS — C50312 Malignant neoplasm of lower-inner quadrant of left female breast: Secondary | ICD-10-CM

## 2022-12-14 DIAGNOSIS — Z171 Estrogen receptor negative status [ER-]: Secondary | ICD-10-CM | POA: Diagnosis not present

## 2022-12-14 DIAGNOSIS — Z95828 Presence of other vascular implants and grafts: Secondary | ICD-10-CM

## 2022-12-14 DIAGNOSIS — Z5112 Encounter for antineoplastic immunotherapy: Secondary | ICD-10-CM | POA: Diagnosis not present

## 2022-12-14 DIAGNOSIS — C50911 Malignant neoplasm of unspecified site of right female breast: Secondary | ICD-10-CM | POA: Diagnosis not present

## 2022-12-14 LAB — CMP (CANCER CENTER ONLY)
ALT: 11 U/L (ref 0–44)
AST: 15 U/L (ref 15–41)
Albumin: 4 g/dL (ref 3.5–5.0)
Alkaline Phosphatase: 221 U/L — ABNORMAL HIGH (ref 38–126)
Anion gap: 5 (ref 5–15)
BUN: 13 mg/dL (ref 8–23)
CO2: 31 mmol/L (ref 22–32)
Calcium: 9 mg/dL (ref 8.9–10.3)
Chloride: 99 mmol/L (ref 98–111)
Creatinine: <0.3 mg/dL — ABNORMAL LOW (ref 0.44–1.00)
Glucose, Bld: 121 mg/dL — ABNORMAL HIGH (ref 70–99)
Potassium: 3.6 mmol/L (ref 3.5–5.1)
Sodium: 135 mmol/L (ref 135–145)
Total Bilirubin: 0.7 mg/dL (ref 0.3–1.2)
Total Protein: 5.9 g/dL — ABNORMAL LOW (ref 6.5–8.1)

## 2022-12-14 LAB — CBC WITH DIFFERENTIAL (CANCER CENTER ONLY)
Abs Immature Granulocytes: 0.02 10*3/uL (ref 0.00–0.07)
Basophils Absolute: 0 10*3/uL (ref 0.0–0.1)
Basophils Relative: 1 %
Eosinophils Absolute: 0 10*3/uL (ref 0.0–0.5)
Eosinophils Relative: 0 %
HCT: 33.8 % — ABNORMAL LOW (ref 36.0–46.0)
Hemoglobin: 11.6 g/dL — ABNORMAL LOW (ref 12.0–15.0)
Immature Granulocytes: 1 %
Lymphocytes Relative: 16 %
Lymphs Abs: 0.5 10*3/uL — ABNORMAL LOW (ref 0.7–4.0)
MCH: 34.5 pg — ABNORMAL HIGH (ref 26.0–34.0)
MCHC: 34.3 g/dL (ref 30.0–36.0)
MCV: 100.6 fL — ABNORMAL HIGH (ref 80.0–100.0)
Monocytes Absolute: 0.2 10*3/uL (ref 0.1–1.0)
Monocytes Relative: 7 %
Neutro Abs: 2.7 10*3/uL (ref 1.7–7.7)
Neutrophils Relative %: 75 %
Platelet Count: 300 10*3/uL (ref 150–400)
RBC: 3.36 MIL/uL — ABNORMAL LOW (ref 3.87–5.11)
RDW: 17.1 % — ABNORMAL HIGH (ref 11.5–15.5)
WBC Count: 3.5 10*3/uL — ABNORMAL LOW (ref 4.0–10.5)
nRBC: 0 % (ref 0.0–0.2)

## 2022-12-14 MED ORDER — SODIUM CHLORIDE 0.9 % IV SOLN
10.0000 mg | Freq: Once | INTRAVENOUS | Status: AC
  Administered 2022-12-14: 10 mg via INTRAVENOUS
  Filled 2022-12-14: qty 10

## 2022-12-14 MED ORDER — SODIUM CHLORIDE 0.9 % IV SOLN
80.0000 mg/m2 | Freq: Once | INTRAVENOUS | Status: AC
  Administered 2022-12-14: 102 mg via INTRAVENOUS
  Filled 2022-12-14: qty 17

## 2022-12-14 MED ORDER — SODIUM CHLORIDE 0.9% FLUSH
10.0000 mL | INTRAVENOUS | Status: DC | PRN
  Administered 2022-12-14: 10 mL

## 2022-12-14 MED ORDER — HEPARIN SOD (PORK) LOCK FLUSH 100 UNIT/ML IV SOLN
500.0000 [IU] | Freq: Once | INTRAVENOUS | Status: AC | PRN
  Administered 2022-12-14: 500 [IU]

## 2022-12-14 MED ORDER — DIPHENHYDRAMINE HCL 50 MG/ML IJ SOLN
25.0000 mg | Freq: Once | INTRAMUSCULAR | Status: AC
  Administered 2022-12-14: 25 mg via INTRAVENOUS
  Filled 2022-12-14: qty 1

## 2022-12-14 MED ORDER — SODIUM CHLORIDE 0.9% FLUSH
10.0000 mL | Freq: Once | INTRAVENOUS | Status: AC
  Administered 2022-12-14: 10 mL

## 2022-12-14 MED ORDER — FAMOTIDINE IN NACL 20-0.9 MG/50ML-% IV SOLN
20.0000 mg | Freq: Once | INTRAVENOUS | Status: AC
  Administered 2022-12-14: 20 mg via INTRAVENOUS
  Filled 2022-12-14: qty 50

## 2022-12-14 MED ORDER — SODIUM CHLORIDE 0.9 % IV SOLN: Freq: Once | INTRAVENOUS | Status: AC

## 2022-12-14 NOTE — Assessment & Plan Note (Signed)
--  She was diagnosed in 12/2017. She is s/p left breast lumpectomy and adjuvant radiation.  -She started anti-estrogen therapy with letrozole on 05/2018. Tolerating well with no issues -lost f/u after visit in 04/2020 until her recurrence in 07/2022  

## 2022-12-14 NOTE — Progress Notes (Signed)
[facility]   [phone] [phone]   Clinic Follow up Note   Patient Care Team: Ardith Dark, MD as PCP - General (Family Medicine) Almond Lint, MD as Consulting Physician (General Surgery) Malachy Mood, MD as Consulting Physician (Hematology) Lonie Peak, MD as Attending Physician (Radiation Oncology) Pollyann Samples, NP as Nurse Practitioner (Nurse Practitioner)  Date of Service:  12/14/2022  CHIEF COMPLAINT: f/u of left breast cancer and Right     CURRENT THERAPY:  Zometa q3 months/   Paclitaxel D1,8,15 + Trastuzumabd D1 + Pertuzumab D1 q21d x 8 cycles /TrastuzumabD1 +Pertuzumab D1 q21 d x4 cycles starting 10/19/2022      ASSESSMENT:  Martha [name] is a 62 y.o. female with   Metastatic breast cancer to bone and brain -Diagnosed in 06/2022 --PET scan from 08/03/2022 showed diffuse bone mets  -she underwent cervical laminectomy and fusion by Dr. Maisie Fus on 08/17/2022, biopsy confirmed metastatic breast cancer, ER/PR negative, HER2 positive. -due to cervical cord compression with myelopathy, she underwent a second cervical spine surgery on September 06, 2022, and completed inpatient rehabitation. -She has completed brain, cervical and lumbar spine radiation in Feb 2024 -She was recently hospitalized for altered mental status and confusion, probably secondary to medication -She started first line systemic chemotherapy with weekly Taxol and trastuzumab/Perjeta on 10/25/22. She has tolerated well so far and her pain has improved overall  Malignant neoplasm of lower-inner quadrant of left breast in female, estrogen receptor positive ([name]) -She was diagnosed in 12/2017. She is s/p left breast lumpectomy and adjuvant radiation.  -She started anti-estrogen therapy with letrozole on 05/2018. Tolerating well with no issues -lost f/u after visit in 04/2020 until her recurrence in 07/2022   Cancer related pain -She is on low-dose oxycodone, she has  developed a significant pain in the right rib cage, probably related to her bone metastasis  -f/u with palliative care clinic NP Nikki      PLAN: -I recommend pt to increase calorie intake. -lab reviewed -MR Brain 4/25 -proceed with D8, C3 Taxol, lab are adequate for treatment - I order PET scan in 2 wks -lab/flush, and chemo 12/21/2022 -f/u in 3 weeks, I will see her every 2 weeks in future     SUMMARY OF ONCOLOGIC HISTORY: Oncology History Overview Note  Cancer Staging Malignant neoplasm of lower-inner quadrant of left breast in female, estrogen receptor positive ([name]) Staging form: Breast, AJCC 8th Edition - Clinical stage from 01/16/2018: Stage IA (cT1c, cN0, cM0, G2, ER+, PR+, HER2-) - Signed by Malachy Mood, MD on 01/23/2018 - Pathologic: Stage IA (pT1c, pN1, cM0, G1, ER+, PR+, HER2-) - Signed by Lonie Peak, MD on 04/09/2018     Malignant neoplasm of lower-inner quadrant of left breast in female, estrogen receptor positive  01/15/2018 Mammogram   Diagnositc Mammogram 01/15/18  IMPRESSION: 1. Suspicious 1.2 x 1.4 x 1.3 cm mixed echogenicity mass left breast 7 o'clock position retroareolar location at the site of palpable concern.. 2. Indeterminate Within the left breast 7:30 o'clock retroareolar location, adjacent to the palpable mass, is a 0.5 x 0.4 x 0.5 cm oval circumscribed hypoechoic mass. 3. Indeterminate calcifications within the lateral left breast. Location of these calcifications is not definitely confirmed on the true lateral view.    01/16/2018 Initial Biopsy   Diagnosis 01/16/18 1. Breast, left, needle core biopsy, 7:30 o'clock (ribbon clip) - FIBROCYSTIC CHANGES WITH SCLEROSING ADENOSIS AND CALCIFICATIONS. - FIBROADENOMATOID CHANGE. - NO MALIGNANCY IDENTIFIED. 2. Breast, left, needle core  biopsy, 7 o'clock position (coil clip) - INVASIVE MAMMARY CARCINOMA, MSBR GRADE I/II. - SEE MICROSCOPIC DESCRIPTION Microscopic Comment  ADDENDUM: Immunohistochemistry  for E-Cadherin is strongly positive in the tumor consistent with ductal carcinoma. (JDP:ah 01/17/18)   01/16/2018 Receptors her2   Estrogen Receptor: 100%, POSITIVE, STRONG STAINING INTENSITY Progesterone Receptor: 50%, POSITIVE, STRONG STAINING INTENSITY Proliferation Marker Ki67: 20% HER2 Negative   01/16/2018 Cancer Staging   Staging form: Breast, AJCC 8th Edition - Clinical stage from 01/16/2018: Stage IA (cT1c, cN0, cM0, G2, ER+, PR+, HER2-) - Signed by Malachy Mood, MD on 01/23/2018   01/22/2018 Initial Diagnosis   Malignant neoplasm of lower-inner quadrant of left breast in female, estrogen receptor positive ([name])   02/21/2018 Surgery    LEFT BREAST LUMPECTOMY WITH AXILLARY LYMPH NODE BIOPSY by Dr. Donell Beers  02/21/18   02/21/2018 Pathology Results   Diagnosis 02/21/18 1. Breast, lumpectomy, Left - INVASIVE DUCTAL CARCINOMA, GRADE I, 1.6 CM. - DUCTAL CARCINOMA IN SITU, INTERMEDIATE NUCLEAR GRADE. - ANTERIOR AND MEDIAL RESECTION MARGINS ARE POSITIVE FOR CARCINOMA. - NEGATIVE FOR LYMPHOVASCULAR OR PERINEURAL INVASION. - BACKGROUND BREAST TISSUE WITH FIBROCYSTIC CHANGE, INCLUDING SCLEROSING ADENOSIS. - BIOPSY SITE CHANGES. - SEE ONCOLOGY TABLE. 2. Lymph node, sentinel, biopsy, Left Axillary #1 - METASTATIC BREAST CARCINOMA TO A LYMPH NODE, 1.0 CM IN GREATEST DIMENSION, WITH EXTRANODAL EXTENSION (1/1). 3. Lymph node, sentinel, biopsy, Left Axillary #2 - LYMPH NODE, NEGATIVE FOR CARCINOMA (0/1).    02/21/2018 Miscellaneous   Mammaprint 02/21/18 Low Risk with 10-year risk of recurrnce at 10% -No potential signifcant chemotherapy benefit   03/20/2018 Pathology Results   RE-EXCISION OF BREAST LUMPECTOMY by Dr. Donell Beers  Diagnosis 03/20/18 1. Breast, excision, Left new anterior margin - FIBROCYSTIC CHANGES WITH ADENOSIS AND CALCIFICATIONS. - HEALING BIOPSY SITE. - THERE IS NO EVIDENCE OF MALIGNANCY. 2. Breast, excision, Left new medial margin - FIBROCYSTIC CHANGES WITH ADENOSIS AND  CALCIFICATIONS. - HEALING BIOPSY SITE. - THERE IS NO EVIDENCE OF MALIGNANCY. Microscopic Comment 1. -2. The surgical resection margin(s) of the specimen were inked and microscopically evaluated. (JBK:kh 03-22-18)   04/09/2018 Cancer Staging   Staging form: Breast, AJCC 8th Edition - Pathologic: Stage IA (pT1c, pN1, cM0, G1, ER+, PR+, HER2-) - Signed by Lonie Peak, MD on 04/09/2018   04/22/2018 - 06/03/2018 Radiation Therapy   Radaiton with Dr. Basilio Cairo 04/22/18-06/03/18   05/2018 -  Anti-estrogen oral therapy   Letrozole 2.5mg  started 05/2018    Survivorship   Per Santiago Glad, NP    05/04/2022 Imaging    IMPRESSION: Cervical spondylosis, as described.   Nonspecific straightening of the expected cervical lordosis.   07/24/2022 Imaging    IMPRESSION: 1. Extensive osseous metastatic disease with pathologic fracture at the base of dens and C2 right lateral mass. Extraosseous tumor at C1 and C2 likely impinging on the right C2 and C3 nerve roots. 2. Degenerative cord impingement at C4-5 to C6-7. Biforaminal impingement at C5-6 and C6-7.   08/03/2022 PET scan    IMPRESSION: 1. Large volume osseous metastasis. 2. Low right cervical and probable right axillary nodal metastasis. 3. Subtle heterogeneous activity throughout the liver with suggestion of small liver lesions (likely new compared to chest CT of 07/16/2020). Findings are overall moderately suspicious for hepatic metastasis. Pre and post contrast abdominal MRI (preferred) or CT could confirm. 4. Right-sided pleural thickening and trace pleural fluid. Right base airspace disease is favored to represent chronic atelectasis. 5. Aortic atherosclerosis (ICD10-I70.0) and emphysema (ICD10-J43.9).     08/11/2022 Genetic Testing   Negative  genetic testing on the CancerNext-Expanded+RNAinsight panel.  The report date is August 11, 2022.  The CancerNext-Expanded gene panel offered by [facility] and includes sequencing and  rearrangement analysis for the following 77 genes: AIP, ALK, APC*, ATM*, AXIN2, BAP1, BARD1, BLM, BMPR1A, BRCA1*, BRCA2*, BRIP1*, CDC73, CDH1*, CDK4, CDKN1B, CDKN2A, CHEK2*, CTNNA1, DICER1, FANCC, FH, FLCN, GALNT12, KIF1B, LZTR1, MAX, MEN1, MET, MLH1*, MSH2*, MSH3, MSH6*, MUTYH*, NBN, NF1*, NF2, NTHL1, PALB2*, PHOX2B, PMS2*, POT1, PRKAR1A, PTCH1, PTEN*, RAD51C*, RAD51D*, RB1, RECQL, RET, SDHA, SDHAF2, SDHB, SDHC, SDHD, SMAD4, SMARCA4, SMARCB1, SMARCE1, STK11, SUFU, TMEM127, TP53*, TSC1, TSC2, VHL and XRCC2 (sequencing and deletion/duplication); EGFR, EGLN1, HOXB13, KIT, MITF, PDGFRA, POLD1, and POLE (sequencing only); EPCAM and GREM1 (deletion/duplication only). DNA and RNA analyses performed for * genes.    Lobular carcinoma in situ (LCIS) of right breast  01/20/2020 Mammogram   Diagnostic Mammogram 01/20/20 IMPRESSION: 1.  Stable post lumpectomy changes of the left breast.   2. Suspicious microcalcifications over the right upper outer quadrant spanning 3.6 cm.   02/02/2020 Initial Biopsy   Diagnosis 02/02/20 Breast, right, needle core biopsy, upper outer quadrant, x clip - LOBULAR CARCINOMA IN SITU WITH PLEOMORPHIC FEATURES AND CALCIFICATIONS, INVOLVING ADENOSIS. SEE NOTE Diagnosis Note Immunohistochemical stain for E-cadherin is negative in the lesional cells, consistent with a lobular phenotype. Immunostains for p63, SMM 1 and calponin do not show evidence of invasive carcinoma.    02/04/2020 Initial Diagnosis   Lobular carcinoma in situ (LCIS) of right breast   03/18/2020 Surgery   RIGHT BREAST LUMPECTOMY WITH RADIOACTIVE SEED LOCALIZATION by Dr Alvira Monday    03/18/2020 Pathology Results   FINAL MICROSCOPIC DIAGNOSIS:   A. BREAST, RIGHT, LUMPECTOMY:  - Pleomorphic lobular carcinoma in situ with calcifications and  underlying complex sclerosing lesion, adenosis and fibroadenomatoid  change.  - Margins of resection are not involved (Closest margins: < 1 mm,  anterior, posterior, inferior and  medial).  - Biopsy site.    COMMENT:   P63, Calponin and SMM-1 demonstrate the presence of myoepithelium in the  select focus.    Metastatic breast cancer to bone and brain  05/04/2022 Imaging    IMPRESSION: Cervical spondylosis, as described.   Nonspecific straightening of the expected cervical lordosis.   07/24/2022 Imaging    IMPRESSION: 1. Extensive osseous metastatic disease with pathologic fracture at the base of dens and C2 right lateral mass. Extraosseous tumor at C1 and C2 likely impinging on the right C2 and C3 nerve roots. 2. Degenerative cord impingement at C4-5 to C6-7. Biforaminal impingement at C5-6 and C6-7.   08/02/2022 Initial Diagnosis   Cancer, metastatic to bone ([name])   08/03/2022 PET scan    IMPRESSION: 1. Large volume osseous metastasis. 2. Low right cervical and probable right axillary nodal metastasis. 3. Subtle heterogeneous activity throughout the liver with suggestion of small liver lesions (likely new compared to chest CT of 07/16/2020). Findings are overall moderately suspicious for hepatic metastasis. Pre and post contrast abdominal MRI (preferred) or CT could confirm. 4. Right-sided pleural thickening and trace pleural fluid. Right base airspace disease is favored to represent chronic atelectasis. 5. Aortic atherosclerosis (ICD10-I70.0) and emphysema (ICD10-J43.9).     10/26/2022 -  Chemotherapy   Patient is on Treatment Plan : BREAST Paclitaxel D1,8,15 + Trastuzumab D1 + Pertuzumab D1 q21d x 8 cycles / Trastuzumab D1 + Pertuzumab D1 q21d x 4 cycles        INTERVAL HISTORY:  Martha [name] is here for a follow up of left breast cancer  and Right  . She was last seen by me on 11/30/2022. She presents to the clinic accompanied by sister. Pt state the her pain is rate a 1 , she doesn't have to take pain medicine. Pt state that she is dong exercises, stand up with her walker. Pt state her appetite is good.     All other systems were  reviewed with the patient and are negative.  MEDICAL HISTORY:  Past Medical History:  Diagnosis Date   Anxiety    Cancer 2022   right breast LCIS   Cancer 2019   left breast   Depression    Dysrhythmia    SVT, s/p ablation ~ 2012 in at [facility]   Ectopic pregnancy    Family history of breast cancer    Family history of melanoma    History of radiation therapy 04/22/18-06/03/18   Left Breast, left SCV, axilla 50 Gy in 25 fractions, Left breast boost 10 Gy in 5 fractions.    Hypothyroidism    Personal history of radiation therapy    PONV (postoperative nausea and vomiting)    Thyroid disease     SURGICAL HISTORY: Past Surgical History:  Procedure Laterality Date   APPENDECTOMY     BILATERAL SALPINGECTOMY     BREAST BIOPSY Right 2014   fibroadenoma   BREAST BIOPSY Left 01/16/2018   BREAST BIOPSY Right 02/02/2020   LCIS   BREAST BIOPSY Right 08/04/2020   x2 LCIS   BREAST BIOPSY Right 08/13/2020   x2   BREAST EXCISIONAL BIOPSY Left 1990   benign   BREAST EXCISIONAL BIOPSY Right 02/02/2020   LCIS   BREAST LUMPECTOMY Left 01/22/2018   BREAST LUMPECTOMY WITH AXILLARY LYMPH NODE BIOPSY Left 02/21/2018   Procedure: LEFT BREAST LUMPECTOMY WITH AXILLARY LYMPH NODE BIOPSY;  Surgeon: Almond Lint, MD;  Location: [facility] OR;  Service: General;  Laterality: Left;   BREAST LUMPECTOMY WITH RADIOACTIVE SEED LOCALIZATION Right 03/18/2020   Procedure: RIGHT BREAST LUMPECTOMY WITH RADIOACTIVE SEED LOCALIZATION;  Surgeon: Almond Lint, MD;  Location: [facility] OR;  Service: General;  Laterality: Right;   BREAST SURGERY Left 1993   cyst removed   ENDOMETRIAL ABLATION     EYE SURGERY     GLAUCOMA SURGERY Bilateral    IR IMAGING GUIDED PORT INSERTION  10/16/2022   POSTERIOR CERVICAL FUSION/FORAMINOTOMY N/A 08/17/2022   Procedure: Cervical One-Cervical Four POSTERIOR CERVICAL FUSION,  Cervical One LAMINECTOMY REDUCTION OF Cervical Two Fracture, Biopsy of Right Cerical Two Pars  Lesion;   Surgeon: Bedelia Person, MD;  Location: [facility];  Service: Neurosurgery;  Laterality: N/A;   POSTERIOR CERVICAL FUSION/FORAMINOTOMY N/A 09/06/2022   Procedure: Posterior Cervical Fusion, Foraminotomy , Cervical Five-Six, Cervical Six-Seven; Extension of  fusion Cervical Four- Thoracic One;  Surgeon: Bedelia Person, MD;  Location: [facility] OR;  Service: Neurosurgery;  Laterality: N/A;   RADIOACTIVE SEED GUIDED EXCISIONAL BREAST BIOPSY Right 04/28/2021   Procedure: RADIOACTIVE SEED GUIDED EXCISIONAL RIGHT BREAST BIOPSY X2;  Surgeon: Almond Lint, MD;  Location: [facility];  Service: General;  Laterality: Right;   RE-EXCISION OF BREAST LUMPECTOMY Left 03/20/2018   Procedure: RE-EXCISION OF BREAST LUMPECTOMY;  Surgeon: Almond Lint, MD;  Location: [facility];  Service: General;  Laterality: Left;    I have reviewed the social history and family history with the patient and they are unchanged from previous note.  ALLERGIES:  is allergic to morphine and related.  MEDICATIONS:  Current Outpatient Medications  Medication Sig Dispense  Refill   acetaminophen (TYLENOL) 500 MG tablet Take 2 tablets (1,000 mg total) by mouth every 8 (eight) hours. 30 tablet 0   citalopram (CELEXA) 40 MG tablet Take 1 tablet (40 mg total) by mouth daily. 30 tablet 3   levothyroxine (SYNTHROID) 75 MCG tablet Take 1 tablet (75 mcg total) by mouth daily before breakfast. 30 tablet 0   lidocaine (XYLOCAINE) 2 % solution Patient: Mix 1part 2% viscous lidocaine, 1part H20. Swallow 10mL of diluted mixture, before meals and at bedtime, up to QID 100 mL 3   lidocaine-prilocaine (EMLA) cream Apply to affected area once (Patient taking differently: Apply 1 Application topically daily as needed (burning).) 30 g 3   LORazepam (ATIVAN) 0.5 MG tablet Take 1 tablet (0.5 mg total) by mouth every 6 (six) hours as needed for anxiety (agitation). 30 tablet 0   Mouthwashes (MOUTH RINSE) LIQD solution 15 mLs  by Mouth Rinse route as needed (for oral care).  0   ondansetron (ZOFRAN) 8 MG tablet Take 1 tablet (8 mg total) by mouth every 8 (eight) hours as needed for nausea or vomiting. 20 tablet 1   OVER THE COUNTER MEDICATION Take 1 tablet by mouth daily. Protandim Supplement     oxyCODONE (OXY IR/ROXICODONE) 5 MG immediate release tablet Take 0.5-1 tablets (2.5-5 mg total) by mouth every 4 (four) hours as needed for moderate pain or severe pain (dyspnea). 30 tablet 0   pregabalin (LYRICA) 25 MG capsule Take 1 capsule (25 mg total) by mouth 2 (two) times daily. 60 capsule 2   senna-docusate (SENOKOT-S) 8.6-50 MG tablet Take 1 tablet by mouth 2 (two) times daily. 60 tablet 0   No current facility-administered medications for this visit.    PHYSICAL EXAMINATION: ECOG PERFORMANCE STATUS: 3 - Symptomatic, >50% confined to bed  Vitals:   12/14/22 1056  BP: 109/63  Pulse: (!) 102  Resp: 18  Temp: 98.4 F (36.9 C)  SpO2: 100%   Wt Readings from Last 3 Encounters:  12/14/22 74 lb 4.8 oz (33.7 kg)  12/07/22 74 lb 12.8 oz (33.9 kg)  11/30/22 75 lb 11.2 oz (34.3 kg)    GENERAL:alert, no distress and comfortable SKIN: skin color, texture, turgor are normal, no rashes or significant lesions EYES: normal, Conjunctiva are pink and non-injected, sclera clear LUNGS:(-) clear to auscultation and percussion with normal breathing effort   LABORATORY DATA:  I have reviewed the data as listed    Latest Ref Rng & Units 12/14/2022   10:26 AM 12/07/2022   10:19 AM 11/30/2022    8:18 AM  CBC  WBC 4.0 - 10.5 K/uL 3.5  3.9  3.2   Hemoglobin 12.0 - 15.0 g/dL 81.1  91.4  78.2   Hematocrit 36.0 - 46.0 % 33.8  33.2  31.4   Platelets 150 - 400 K/uL 300  374  284         Latest Ref Rng & Units 12/14/2022   10:26 AM 12/07/2022   10:19 AM 11/30/2022    8:18 AM  CMP  Glucose 70 - 99 mg/dL 956  213  086   BUN 8 - 23 mg/dL 13  7  12    Creatinine 0.44 - 1.00 mg/dL <5.78  <4.69  <6.29   Sodium 135 - 145 mmol/L 135   137  132   Potassium 3.5 - 5.1 mmol/L 3.6  3.2  4.0   Chloride 98 - 111 mmol/L 99  102  99   CO2 22 - 32  mmol/L 31  29  29    Calcium 8.9 - 10.3 mg/dL 9.0  8.7  9.1   Total Protein 6.5 - 8.1 g/dL 5.9  6.2  6.1   Total Bilirubin 0.3 - 1.2 mg/dL 0.7  0.6  0.8   Alkaline Phos 38 - 126 U/L 221  335  550   AST 15 - 41 U/L 15  14  20    ALT 0 - 44 U/L 11  11  21        RADIOGRAPHIC STUDIES: I have personally reviewed the radiological images as listed and agreed with the findings in the report. No results found.    No orders of the defined types were placed in this encounter.  All questions were answered. The patient knows to call the clinic with any problems, questions or concerns. No barriers to learning was detected. The total time spent in the appointment was 30 minutes.     Malachy Mood, MD 12/14/2022   Carolin Coy, CMA, am acting as scribe for Malachy Mood, MD.   I have reviewed the above documentation for accuracy and completeness, and I agree with the above.

## 2022-12-14 NOTE — Assessment & Plan Note (Signed)
-  Diagnosed in 06/2022 --PET scan from 08/03/2022 showed diffuse bone mets  -she underwent cervical laminectomy and fusion by Dr. Thomas on 08/17/2022, biopsy confirmed metastatic breast cancer, ER/PR negative, HER2 positive. -due to cervical cord compression with myelopathy, she underwent a second cervical spine surgery on September 06, 2022, and completed inpatient rehabitation. -She has completed brain, cervical and lumbar spine radiation in Feb 2024 -She was recently hospitalized for altered mental status and confusion, probably secondary to medication -She started first line systemic chemotherapy with weekly Taxol and trastuzumab/Perjeta on 10/25/22. She has tolerated well so far and her pain has improved overall  

## 2022-12-14 NOTE — Research (Signed)
Trial: [id number]: Use of a Clinical Trial Screening Tool to Address Cancer Health Disparities in the [facility] Oncology Research Program [facility])    Patient Martha [name] was identified by this research nurse as a potential candidate for the above listed study.  This Clinical Research Nurse met with Martha [name], [id number], on 12/14/22 in a manner and location that ensures patient privacy to discuss participation in the above listed research study.  Patient is Accompanied by her sister, Martha [name] .  A copy of the informed consent document and separate HIPAA Authorization was provided to the patient.  Patient reads, speaks, and understands [place].    Patient was provided with the business card of this Nurse and encouraged to contact the research team with any questions.  Patient was provided the option of taking informed consent documents home to review and was encouraged to review at their convenience with their support network, including other care providers. Patient is comfortable with making a decision regarding study participation today.  As outlined in the informed consent form, this Nurse and Martha [name] discussed the purpose of the research study, the investigational nature of the study, study procedures and requirements for study participation, potential risks and benefits of study participation, as well as alternatives to participation. This study is not blinded. The patient understands participation is voluntary and they may withdraw from study participation at any time.  This study does not involve randomization.  This study does not involve an investigational drug or device. This study does not involve a placebo. Patient understands enrollment is pending full eligibility review.   Confidentiality and how the patient's information will be used as part of study participation were discussed.  Patient was informed there is not reimbursement provided for their time and  effort spent on trial participation.  The patient is encouraged to discuss research study participation with their insurance provider to determine what costs they may incur as part of study participation, including research related injury.    All questions were answered to patient's satisfaction.  The informed consent and separate HIPAA Authorization was reviewed page by page.  The patient's mental and emotional status is appropriate to provide informed consent, and the patient verbalizes an understanding of study participation.  Patient has agreed to participate in the above listed research study and has voluntarily signed the informed consent protocol version date 09/26/2021 and separate HIPAA Authorization, version 15 06/07/2021  on 12/14/22 at 1:25PM.  The patient was provided with a copy of the signed informed consent form and separate HIPAA Authorization for their reference.  No study specific procedures were obtained prior to the signing of the informed consent document.  Approximately 20 minutes were spent with the patient reviewing the informed consent documents.  Patient was not requested to complete a Release of Information form.  After consent patient was interviewed to collect demographic information for this study.  Thanked patient for her participation.    This Nurse has reviewed this patient's inclusion and exclusion criteria and confirmed Martha [name] is eligible for study participation.  Patient will continue with enrollment.  Martha [name], BSN, RN, [employer] Research Nurse II [phone] 12/14/2022

## 2022-12-14 NOTE — Assessment & Plan Note (Signed)
-  She is on low-dose oxycodone, she has developed a significant pain in the right rib cage, probably related to her bone metastasis  -f/u with palliative care clinic NP Nikki  

## 2022-12-18 DIAGNOSIS — Z79891 Long term (current) use of opiate analgesic: Secondary | ICD-10-CM | POA: Diagnosis not present

## 2022-12-18 DIAGNOSIS — F419 Anxiety disorder, unspecified: Secondary | ICD-10-CM | POA: Diagnosis not present

## 2022-12-18 DIAGNOSIS — C50312 Malignant neoplasm of lower-inner quadrant of left female breast: Secondary | ICD-10-CM | POA: Diagnosis not present

## 2022-12-18 DIAGNOSIS — G893 Neoplasm related pain (acute) (chronic): Secondary | ICD-10-CM | POA: Diagnosis not present

## 2022-12-18 DIAGNOSIS — C7951 Secondary malignant neoplasm of bone: Secondary | ICD-10-CM | POA: Diagnosis not present

## 2022-12-18 DIAGNOSIS — Z981 Arthrodesis status: Secondary | ICD-10-CM | POA: Diagnosis not present

## 2022-12-18 DIAGNOSIS — Z87891 Personal history of nicotine dependence: Secondary | ICD-10-CM | POA: Diagnosis not present

## 2022-12-18 DIAGNOSIS — Z170 Estrogen receptor positive status [ER+]: Secondary | ICD-10-CM | POA: Diagnosis not present

## 2022-12-18 DIAGNOSIS — E039 Hypothyroidism, unspecified: Secondary | ICD-10-CM | POA: Diagnosis not present

## 2022-12-18 DIAGNOSIS — Z7901 Long term (current) use of anticoagulants: Secondary | ICD-10-CM | POA: Diagnosis not present

## 2022-12-18 DIAGNOSIS — E876 Hypokalemia: Secondary | ICD-10-CM | POA: Diagnosis not present

## 2022-12-18 DIAGNOSIS — E43 Unspecified severe protein-calorie malnutrition: Secondary | ICD-10-CM | POA: Diagnosis not present

## 2022-12-18 DIAGNOSIS — F32A Depression, unspecified: Secondary | ICD-10-CM | POA: Diagnosis not present

## 2022-12-18 DIAGNOSIS — Z9012 Acquired absence of left breast and nipple: Secondary | ICD-10-CM | POA: Diagnosis not present

## 2022-12-18 DIAGNOSIS — M542 Cervicalgia: Secondary | ICD-10-CM | POA: Diagnosis not present

## 2022-12-20 ENCOUNTER — Ambulatory Visit: Payer: BC Managed Care – PPO

## 2022-12-20 DIAGNOSIS — Z87891 Personal history of nicotine dependence: Secondary | ICD-10-CM | POA: Diagnosis not present

## 2022-12-20 DIAGNOSIS — Z9012 Acquired absence of left breast and nipple: Secondary | ICD-10-CM | POA: Diagnosis not present

## 2022-12-20 DIAGNOSIS — Z79891 Long term (current) use of opiate analgesic: Secondary | ICD-10-CM | POA: Diagnosis not present

## 2022-12-20 DIAGNOSIS — E876 Hypokalemia: Secondary | ICD-10-CM | POA: Diagnosis not present

## 2022-12-20 DIAGNOSIS — E039 Hypothyroidism, unspecified: Secondary | ICD-10-CM | POA: Diagnosis not present

## 2022-12-20 DIAGNOSIS — E43 Unspecified severe protein-calorie malnutrition: Secondary | ICD-10-CM | POA: Diagnosis not present

## 2022-12-20 DIAGNOSIS — Z981 Arthrodesis status: Secondary | ICD-10-CM | POA: Diagnosis not present

## 2022-12-20 DIAGNOSIS — C50312 Malignant neoplasm of lower-inner quadrant of left female breast: Secondary | ICD-10-CM | POA: Diagnosis not present

## 2022-12-20 DIAGNOSIS — Z7901 Long term (current) use of anticoagulants: Secondary | ICD-10-CM | POA: Diagnosis not present

## 2022-12-20 DIAGNOSIS — C7951 Secondary malignant neoplasm of bone: Secondary | ICD-10-CM | POA: Diagnosis not present

## 2022-12-20 DIAGNOSIS — M542 Cervicalgia: Secondary | ICD-10-CM | POA: Diagnosis not present

## 2022-12-20 DIAGNOSIS — F32A Depression, unspecified: Secondary | ICD-10-CM | POA: Diagnosis not present

## 2022-12-20 DIAGNOSIS — F419 Anxiety disorder, unspecified: Secondary | ICD-10-CM | POA: Diagnosis not present

## 2022-12-20 DIAGNOSIS — G893 Neoplasm related pain (acute) (chronic): Secondary | ICD-10-CM | POA: Diagnosis not present

## 2022-12-20 DIAGNOSIS — Z170 Estrogen receptor positive status [ER+]: Secondary | ICD-10-CM | POA: Diagnosis not present

## 2022-12-20 MED FILL — Dexamethasone Sodium Phosphate Inj 100 MG/10ML: INTRAMUSCULAR | Qty: 1 | Status: AC

## 2022-12-21 ENCOUNTER — Ambulatory Visit (HOSPITAL_COMMUNITY)
Admission: RE | Admit: 2022-12-21 | Discharge: 2022-12-21 | Disposition: A | Discharge status: Home / Self Care | Payer: BC Managed Care – PPO | Source: Ambulatory Visit | Attending: Radiation Oncology | Admitting: Radiation Oncology

## 2022-12-21 ENCOUNTER — Inpatient Hospital Stay: Payer: BC Managed Care – PPO

## 2022-12-21 ENCOUNTER — Inpatient Hospital Stay: Payer: BC Managed Care – PPO | Admitting: Dietician

## 2022-12-21 ENCOUNTER — Ambulatory Visit: Payer: BC Managed Care – PPO | Admitting: Hematology

## 2022-12-21 ENCOUNTER — Other Ambulatory Visit: Payer: Self-pay

## 2022-12-21 VITALS — BP 141/89 | HR 80 | Temp 97.7°F | Resp 16 | Wt 75.6 lb

## 2022-12-21 DIAGNOSIS — Z171 Estrogen receptor negative status [ER-]: Secondary | ICD-10-CM | POA: Diagnosis not present

## 2022-12-21 DIAGNOSIS — C7951 Secondary malignant neoplasm of bone: Secondary | ICD-10-CM

## 2022-12-21 DIAGNOSIS — C50911 Malignant neoplasm of unspecified site of right female breast: Secondary | ICD-10-CM | POA: Diagnosis not present

## 2022-12-21 DIAGNOSIS — C50312 Malignant neoplasm of lower-inner quadrant of left female breast: Secondary | ICD-10-CM | POA: Diagnosis not present

## 2022-12-21 DIAGNOSIS — C7931 Secondary malignant neoplasm of brain: Secondary | ICD-10-CM | POA: Insufficient documentation

## 2022-12-21 DIAGNOSIS — G893 Neoplasm related pain (acute) (chronic): Secondary | ICD-10-CM | POA: Diagnosis not present

## 2022-12-21 DIAGNOSIS — Z79899 Other long term (current) drug therapy: Secondary | ICD-10-CM | POA: Diagnosis not present

## 2022-12-21 DIAGNOSIS — Z5112 Encounter for antineoplastic immunotherapy: Secondary | ICD-10-CM | POA: Diagnosis not present

## 2022-12-21 DIAGNOSIS — Z95828 Presence of other vascular implants and grafts: Secondary | ICD-10-CM

## 2022-12-21 DIAGNOSIS — G9389 Other specified disorders of brain: Secondary | ICD-10-CM | POA: Diagnosis not present

## 2022-12-21 DIAGNOSIS — Z170 Estrogen receptor positive status [ER+]: Secondary | ICD-10-CM | POA: Diagnosis not present

## 2022-12-21 LAB — CBC WITH DIFFERENTIAL (CANCER CENTER ONLY)
Abs Immature Granulocytes: 0.04 10*3/uL (ref 0.00–0.07)
Basophils Absolute: 0 10*3/uL (ref 0.0–0.1)
Basophils Relative: 1 %
Eosinophils Absolute: 0 10*3/uL (ref 0.0–0.5)
Eosinophils Relative: 1 %
HCT: 35.8 % — ABNORMAL LOW (ref 36.0–46.0)
Hemoglobin: 12 g/dL (ref 12.0–15.0)
Immature Granulocytes: 1 %
Lymphocytes Relative: 26 %
Lymphs Abs: 0.7 10*3/uL (ref 0.7–4.0)
MCH: 34 pg (ref 26.0–34.0)
MCHC: 33.5 g/dL (ref 30.0–36.0)
MCV: 101.4 fL — ABNORMAL HIGH (ref 80.0–100.0)
Monocytes Absolute: 0.2 10*3/uL (ref 0.1–1.0)
Monocytes Relative: 7 %
Neutro Abs: 1.8 10*3/uL (ref 1.7–7.7)
Neutrophils Relative %: 64 %
Platelet Count: 283 10*3/uL (ref 150–400)
RBC: 3.53 MIL/uL — ABNORMAL LOW (ref 3.87–5.11)
RDW: 16 % — ABNORMAL HIGH (ref 11.5–15.5)
WBC Count: 2.9 10*3/uL — ABNORMAL LOW (ref 4.0–10.5)
nRBC: 0 % (ref 0.0–0.2)

## 2022-12-21 LAB — CMP (CANCER CENTER ONLY)
ALT: 12 U/L (ref 0–44)
AST: 16 U/L (ref 15–41)
Albumin: 4 g/dL (ref 3.5–5.0)
Alkaline Phosphatase: 151 U/L — ABNORMAL HIGH (ref 38–126)
Anion gap: 6 (ref 5–15)
BUN: 16 mg/dL (ref 8–23)
CO2: 30 mmol/L (ref 22–32)
Calcium: 9 mg/dL (ref 8.9–10.3)
Chloride: 101 mmol/L (ref 98–111)
Creatinine: <0.3 mg/dL — ABNORMAL LOW (ref 0.44–1.00)
Glucose, Bld: 123 mg/dL — ABNORMAL HIGH (ref 70–99)
Potassium: 3.3 mmol/L — ABNORMAL LOW (ref 3.5–5.1)
Sodium: 137 mmol/L (ref 135–145)
Total Bilirubin: 0.7 mg/dL (ref 0.3–1.2)
Total Protein: 5.8 g/dL — ABNORMAL LOW (ref 6.5–8.1)

## 2022-12-21 MED ORDER — SODIUM CHLORIDE 0.9% FLUSH
10.0000 mL | Freq: Once | INTRAVENOUS | Status: AC
  Administered 2022-12-21: 10 mL

## 2022-12-21 MED ORDER — SODIUM CHLORIDE 0.9 % IV SOLN
10.0000 mg | Freq: Once | INTRAVENOUS | Status: AC
  Administered 2022-12-21: 10 mg via INTRAVENOUS
  Filled 2022-12-21: qty 10

## 2022-12-21 MED ORDER — SODIUM CHLORIDE 0.9 % IV SOLN: Freq: Once | INTRAVENOUS | Status: AC

## 2022-12-21 MED ORDER — DIPHENHYDRAMINE HCL 50 MG/ML IJ SOLN
25.0000 mg | Freq: Once | INTRAMUSCULAR | Status: AC
  Administered 2022-12-21: 25 mg via INTRAVENOUS
  Filled 2022-12-21: qty 1

## 2022-12-21 MED ORDER — HEPARIN SOD (PORK) LOCK FLUSH 100 UNIT/ML IV SOLN
500.0000 [IU] | Freq: Once | INTRAVENOUS | Status: AC | PRN
  Administered 2022-12-21: 500 [IU]

## 2022-12-21 MED ORDER — FAMOTIDINE IN NACL 20-0.9 MG/50ML-% IV SOLN
20.0000 mg | Freq: Once | INTRAVENOUS | Status: AC
  Administered 2022-12-21: 20 mg via INTRAVENOUS
  Filled 2022-12-21: qty 50

## 2022-12-21 MED ORDER — SODIUM CHLORIDE 0.9% FLUSH
10.0000 mL | INTRAVENOUS | Status: DC | PRN
  Administered 2022-12-21: 10 mL

## 2022-12-21 MED ORDER — SODIUM CHLORIDE 0.9 % IV SOLN
80.0000 mg/m2 | Freq: Once | INTRAVENOUS | Status: AC
  Administered 2022-12-21: 102 mg via INTRAVENOUS
  Filled 2022-12-21: qty 17

## 2022-12-21 MED ORDER — GADOBUTROL 1 MMOL/ML IV SOLN
3.0000 mL | Freq: Once | INTRAVENOUS | Status: AC | PRN
  Administered 2022-12-21: 3 mL via INTRAVENOUS

## 2022-12-21 NOTE — Progress Notes (Signed)
Nutrition Follow-up:  Patient with recurrent breast cancer with mets to brain and bone. Patient is status post radiation therapy. She is currently receiving taxol, perjeta + ogivri q21d   Met with patient in infusion. She reports appetite has been good. Patient says she is always eating and thought she would have gained some weight. Patient has been craving egg drop soup recently. Recalls high calorie high protein foods (cottage cheese, yogurt, peanut butter, lots of alfredo). Patient is drinking at least 2 Ensure Complete. She does try to drink 3 and is able to do this most days. Patient denies nausea, vomiting, constipation. She reports diarrhea once a week. She takes an imodium after first episode. This works well. She is having increased fatigue.    Medications: reviewed   Labs: K 3.3, glucose 123, Cr 0.30  Anthropometrics: Wt 75 lb 9 oz today - stable  4/18 - 74 lb 8 oz 4/4 - 75 lb 11.2 oz 3/14 - 75 lb   NUTRITION DIAGNOSIS: Inadequate oral intake improving    INTERVENTION:  Continue strategies for increasing calories and protein with small frequent meals and snacks Reviewed ways to make the most of every bite Patient agreeable to having bedtime snack Continue drinking Ensure Complete TID - coupons provided     MONITORING, EVALUATION, GOAL: weight trends, intake    NEXT VISIT: Thursday May 16 during infusion with Britta Mccreedy

## 2022-12-21 NOTE — Patient Instructions (Signed)
[facility]  Discharge Instructions: Thank you for choosing [facility] to provide your oncology and hematology care.   If you have a lab appointment with the Cancer Center, please go directly to the Cancer Center and check in at the registration area.   Wear comfortable clothing and clothing appropriate for easy access to any Portacath or PICC line.   We strive to give you quality time with your provider. You may need to reschedule your appointment if you arrive late (15 or more minutes).  Arriving late affects you and other patients whose appointments are after yours.  Also, if you miss three or more appointments without notifying the office, you may be dismissed from the clinic at the provider's discretion.      For prescription refill requests, have your pharmacy contact our office and allow 72 hours for refills to be completed.    Today you received the following chemotherapy and/or immunotherapy agent Paclitaxel      To help prevent nausea and vomiting after your treatment, we encourage you to take your nausea medication as directed.  BELOW ARE SYMPTOMS THAT SHOULD BE REPORTED IMMEDIATELY: *FEVER GREATER THAN 100.4 F (38 C) OR HIGHER *CHILLS OR SWEATING *NAUSEA AND VOMITING THAT IS NOT CONTROLLED WITH YOUR NAUSEA MEDICATION *UNUSUAL SHORTNESS OF BREATH *UNUSUAL BRUISING OR BLEEDING *URINARY PROBLEMS (pain or burning when urinating, or frequent urination) *BOWEL PROBLEMS (unusual diarrhea, constipation, pain near the anus) TENDERNESS IN MOUTH AND THROAT WITH OR WITHOUT PRESENCE OF ULCERS (sore throat, sores in mouth, or a toothache) UNUSUAL RASH, SWELLING OR PAIN  UNUSUAL VAGINAL DISCHARGE OR ITCHING   Items with * indicate a potential emergency and should be followed up as soon as possible or go to the Emergency Department if any problems should occur.  Please show the CHEMOTHERAPY ALERT CARD or IMMUNOTHERAPY ALERT CARD at  check-in to the Emergency Department and triage nurse.  Should you have questions after your visit or need to cancel or reschedule your appointment, please contact [facility]  Dept: [phone]  and follow the prompts.  Office hours are 8:00 a.m. to 4:30 p.m. Monday - Friday. Please note that voicemails left after 4:00 p.m. may not be returned until the following business day.  We are closed weekends and major holidays. You have access to a nurse at all times for urgent questions. Please call the main number to the clinic Dept: [phone] and follow the prompts.   For any non-urgent questions, you may also contact your provider using MyChart. We now offer e-Visits for anyone 62 and older to request care online for non-urgent symptoms. For details visit [email or web address].   Also download the MyChart app! Go to the app store, search "MyChart", open the app, select , and log in with your MyChart username and password.

## 2022-12-25 ENCOUNTER — Inpatient Hospital Stay: Payer: BC Managed Care – PPO

## 2022-12-25 DIAGNOSIS — M8458XA Pathological fracture in neoplastic disease, other specified site, initial encounter for fracture: Secondary | ICD-10-CM | POA: Diagnosis not present

## 2022-12-25 DIAGNOSIS — S12191A Other nondisplaced fracture of second cervical vertebra, initial encounter for closed fracture: Secondary | ICD-10-CM | POA: Diagnosis not present

## 2022-12-25 DIAGNOSIS — Z681 Body mass index (BMI) 19 or less, adult: Secondary | ICD-10-CM | POA: Diagnosis not present

## 2022-12-26 DIAGNOSIS — Z170 Estrogen receptor positive status [ER+]: Secondary | ICD-10-CM | POA: Diagnosis not present

## 2022-12-26 DIAGNOSIS — Z981 Arthrodesis status: Secondary | ICD-10-CM | POA: Diagnosis not present

## 2022-12-26 DIAGNOSIS — E43 Unspecified severe protein-calorie malnutrition: Secondary | ICD-10-CM | POA: Diagnosis not present

## 2022-12-26 DIAGNOSIS — C50312 Malignant neoplasm of lower-inner quadrant of left female breast: Secondary | ICD-10-CM | POA: Diagnosis not present

## 2022-12-26 DIAGNOSIS — M542 Cervicalgia: Secondary | ICD-10-CM | POA: Diagnosis not present

## 2022-12-26 DIAGNOSIS — F419 Anxiety disorder, unspecified: Secondary | ICD-10-CM | POA: Diagnosis not present

## 2022-12-26 DIAGNOSIS — Z87891 Personal history of nicotine dependence: Secondary | ICD-10-CM | POA: Diagnosis not present

## 2022-12-26 DIAGNOSIS — E039 Hypothyroidism, unspecified: Secondary | ICD-10-CM | POA: Diagnosis not present

## 2022-12-26 DIAGNOSIS — G893 Neoplasm related pain (acute) (chronic): Secondary | ICD-10-CM | POA: Diagnosis not present

## 2022-12-26 DIAGNOSIS — E876 Hypokalemia: Secondary | ICD-10-CM | POA: Diagnosis not present

## 2022-12-26 DIAGNOSIS — F32A Depression, unspecified: Secondary | ICD-10-CM | POA: Diagnosis not present

## 2022-12-26 DIAGNOSIS — Z9012 Acquired absence of left breast and nipple: Secondary | ICD-10-CM | POA: Diagnosis not present

## 2022-12-26 DIAGNOSIS — C7951 Secondary malignant neoplasm of bone: Secondary | ICD-10-CM | POA: Diagnosis not present

## 2022-12-26 DIAGNOSIS — Z7901 Long term (current) use of anticoagulants: Secondary | ICD-10-CM | POA: Diagnosis not present

## 2022-12-26 DIAGNOSIS — Z79891 Long term (current) use of opiate analgesic: Secondary | ICD-10-CM | POA: Diagnosis not present

## 2022-12-27 ENCOUNTER — Other Ambulatory Visit: Payer: Self-pay | Admitting: Radiation Therapy

## 2022-12-27 ENCOUNTER — Telehealth: Payer: Self-pay | Admitting: Radiation Therapy

## 2022-12-27 DIAGNOSIS — Z981 Arthrodesis status: Secondary | ICD-10-CM | POA: Diagnosis not present

## 2022-12-27 DIAGNOSIS — G893 Neoplasm related pain (acute) (chronic): Secondary | ICD-10-CM | POA: Diagnosis not present

## 2022-12-27 DIAGNOSIS — Z87891 Personal history of nicotine dependence: Secondary | ICD-10-CM | POA: Diagnosis not present

## 2022-12-27 DIAGNOSIS — Z7901 Long term (current) use of anticoagulants: Secondary | ICD-10-CM | POA: Diagnosis not present

## 2022-12-27 DIAGNOSIS — Z170 Estrogen receptor positive status [ER+]: Secondary | ICD-10-CM | POA: Diagnosis not present

## 2022-12-27 DIAGNOSIS — E039 Hypothyroidism, unspecified: Secondary | ICD-10-CM | POA: Diagnosis not present

## 2022-12-27 DIAGNOSIS — C7931 Secondary malignant neoplasm of brain: Secondary | ICD-10-CM

## 2022-12-27 DIAGNOSIS — E43 Unspecified severe protein-calorie malnutrition: Secondary | ICD-10-CM | POA: Diagnosis not present

## 2022-12-27 DIAGNOSIS — F419 Anxiety disorder, unspecified: Secondary | ICD-10-CM | POA: Diagnosis not present

## 2022-12-27 DIAGNOSIS — F32A Depression, unspecified: Secondary | ICD-10-CM | POA: Diagnosis not present

## 2022-12-27 DIAGNOSIS — Z9012 Acquired absence of left breast and nipple: Secondary | ICD-10-CM | POA: Diagnosis not present

## 2022-12-27 DIAGNOSIS — C7951 Secondary malignant neoplasm of bone: Secondary | ICD-10-CM | POA: Diagnosis not present

## 2022-12-27 DIAGNOSIS — E876 Hypokalemia: Secondary | ICD-10-CM | POA: Diagnosis not present

## 2022-12-27 DIAGNOSIS — Z79891 Long term (current) use of opiate analgesic: Secondary | ICD-10-CM | POA: Diagnosis not present

## 2022-12-27 DIAGNOSIS — M542 Cervicalgia: Secondary | ICD-10-CM | POA: Diagnosis not present

## 2022-12-27 DIAGNOSIS — C50312 Malignant neoplasm of lower-inner quadrant of left female breast: Secondary | ICD-10-CM | POA: Diagnosis not present

## 2022-12-27 MED FILL — Dexamethasone Sodium Phosphate Inj 100 MG/10ML: INTRAMUSCULAR | Qty: 1 | Status: AC

## 2022-12-27 NOTE — Telephone Encounter (Signed)
[name] had an appointment with Dr. Maisie Fus on Monday 4/29 for follow-up and to discuss her recent brain MRI results. She and her husband attended the visit but unfortunately the official radiology report was not available until later. Dr. Basilio Cairo reviewed the final report and asked that I call to share with the patient.   I spoke with [name]'s husband, [name]. He shared that Sandrea Hammond is doing well, she continues to improve both physically and mentally since the beginning of her treatment. [name] was very happy about her progress and that she is slowly becoming more and more like "her old self again".  As requested we discussed the recent brain MRI results. [name] has responded well to the brain radiation course. There are a couple very small new areas mentioned on the scan that Dr. Basilio Cairo feels we should continue to observe with another scan in three months instead of treating at this time. The recommendation is that [name] continue as scheduled with her systemic treatment with Dr. Mosetta Putt and see Dr. Basilio Cairo back after the repeat brain MRI.   [name] was very happy with this plan and excited to share with [name]. If either of them have additional questions he will give me a call back.   Scan is scheduled Monday 7/29 and follow-up with Dr. Basilio Cairo Friday 8/2.  Jalene Mullet R.T.(R)(T) Radiation Special Procedures Navigator

## 2022-12-28 ENCOUNTER — Inpatient Hospital Stay: Payer: BC Managed Care – PPO | Attending: Genetic Counselor

## 2022-12-28 ENCOUNTER — Inpatient Hospital Stay: Payer: BC Managed Care – PPO

## 2022-12-28 ENCOUNTER — Inpatient Hospital Stay (HOSPITAL_BASED_OUTPATIENT_CLINIC_OR_DEPARTMENT_OTHER): Payer: BC Managed Care – PPO | Admitting: Hematology

## 2022-12-28 ENCOUNTER — Encounter: Payer: Self-pay | Admitting: Hematology

## 2022-12-28 ENCOUNTER — Other Ambulatory Visit: Payer: Self-pay

## 2022-12-28 VITALS — BP 121/72 | HR 78 | Resp 17

## 2022-12-28 VITALS — BP 94/70 | HR 88 | Temp 97.7°F | Resp 18 | Ht 63.0 in | Wt 75.7 lb

## 2022-12-28 DIAGNOSIS — Z95828 Presence of other vascular implants and grafts: Secondary | ICD-10-CM

## 2022-12-28 DIAGNOSIS — Z79899 Other long term (current) drug therapy: Secondary | ICD-10-CM | POA: Diagnosis not present

## 2022-12-28 DIAGNOSIS — C7931 Secondary malignant neoplasm of brain: Secondary | ICD-10-CM | POA: Diagnosis not present

## 2022-12-28 DIAGNOSIS — C50312 Malignant neoplasm of lower-inner quadrant of left female breast: Secondary | ICD-10-CM | POA: Insufficient documentation

## 2022-12-28 DIAGNOSIS — Z170 Estrogen receptor positive status [ER+]: Secondary | ICD-10-CM | POA: Insufficient documentation

## 2022-12-28 DIAGNOSIS — G893 Neoplasm related pain (acute) (chronic): Secondary | ICD-10-CM

## 2022-12-28 DIAGNOSIS — Z923 Personal history of irradiation: Secondary | ICD-10-CM | POA: Insufficient documentation

## 2022-12-28 DIAGNOSIS — Z5112 Encounter for antineoplastic immunotherapy: Secondary | ICD-10-CM | POA: Diagnosis not present

## 2022-12-28 DIAGNOSIS — C7951 Secondary malignant neoplasm of bone: Secondary | ICD-10-CM

## 2022-12-28 LAB — CMP (CANCER CENTER ONLY)
ALT: 25 U/L (ref 0–44)
AST: 25 U/L (ref 15–41)
Albumin: 4 g/dL (ref 3.5–5.0)
Alkaline Phosphatase: 140 U/L — ABNORMAL HIGH (ref 38–126)
Anion gap: 5 (ref 5–15)
BUN: 11 mg/dL (ref 8–23)
CO2: 31 mmol/L (ref 22–32)
Calcium: 8.7 mg/dL — ABNORMAL LOW (ref 8.9–10.3)
Chloride: 102 mmol/L (ref 98–111)
Creatinine: <0.3 mg/dL — ABNORMAL LOW (ref 0.44–1.00)
Glucose, Bld: 111 mg/dL — ABNORMAL HIGH (ref 70–99)
Potassium: 3.4 mmol/L — ABNORMAL LOW (ref 3.5–5.1)
Sodium: 138 mmol/L (ref 135–145)
Total Bilirubin: 0.6 mg/dL (ref 0.3–1.2)
Total Protein: 5.8 g/dL — ABNORMAL LOW (ref 6.5–8.1)

## 2022-12-28 LAB — CBC WITH DIFFERENTIAL (CANCER CENTER ONLY)
Abs Immature Granulocytes: 0.03 10*3/uL (ref 0.00–0.07)
Basophils Absolute: 0 10*3/uL (ref 0.0–0.1)
Basophils Relative: 1 %
Eosinophils Absolute: 0 10*3/uL (ref 0.0–0.5)
Eosinophils Relative: 1 %
HCT: 34.8 % — ABNORMAL LOW (ref 36.0–46.0)
Hemoglobin: 12 g/dL (ref 12.0–15.0)
Immature Granulocytes: 1 %
Lymphocytes Relative: 23 %
Lymphs Abs: 0.7 10*3/uL (ref 0.7–4.0)
MCH: 34.5 pg — ABNORMAL HIGH (ref 26.0–34.0)
MCHC: 34.5 g/dL (ref 30.0–36.0)
MCV: 100 fL (ref 80.0–100.0)
Monocytes Absolute: 0.2 10*3/uL (ref 0.1–1.0)
Monocytes Relative: 7 %
Neutro Abs: 2.1 10*3/uL (ref 1.7–7.7)
Neutrophils Relative %: 67 %
Platelet Count: 263 10*3/uL (ref 150–400)
RBC: 3.48 MIL/uL — ABNORMAL LOW (ref 3.87–5.11)
RDW: 15.9 % — ABNORMAL HIGH (ref 11.5–15.5)
WBC Count: 3.2 10*3/uL — ABNORMAL LOW (ref 4.0–10.5)
nRBC: 0 % (ref 0.0–0.2)

## 2022-12-28 MED ORDER — SODIUM CHLORIDE 0.9% FLUSH
10.0000 mL | Freq: Once | INTRAVENOUS | Status: AC
  Administered 2022-12-28: 10 mL

## 2022-12-28 MED ORDER — SODIUM CHLORIDE 0.9 % IV SOLN
80.0000 mg/m2 | Freq: Once | INTRAVENOUS | Status: AC
  Administered 2022-12-28: 102 mg via INTRAVENOUS
  Filled 2022-12-28: qty 17

## 2022-12-28 MED ORDER — FAMOTIDINE 20 MG IN NS 100 ML IVPB: 20.0000 mg | Freq: Once | INTRAVENOUS | Status: DC

## 2022-12-28 MED ORDER — SODIUM CHLORIDE 0.9 % IV SOLN
10.0000 mg | Freq: Once | INTRAVENOUS | Status: AC
  Administered 2022-12-28: 10 mg via INTRAVENOUS
  Filled 2022-12-28: qty 10

## 2022-12-28 MED ORDER — DIPHENHYDRAMINE HCL 50 MG/ML IJ SOLN
25.0000 mg | Freq: Once | INTRAMUSCULAR | Status: AC
  Administered 2022-12-28: 25 mg via INTRAVENOUS
  Filled 2022-12-28: qty 1

## 2022-12-28 MED ORDER — SODIUM CHLORIDE 0.9 % IV SOLN: Freq: Once | INTRAVENOUS | Status: AC

## 2022-12-28 MED ORDER — HEPARIN SOD (PORK) LOCK FLUSH 100 UNIT/ML IV SOLN
500.0000 [IU] | Freq: Once | INTRAVENOUS | Status: AC | PRN
  Administered 2022-12-28: 500 [IU]

## 2022-12-28 MED ORDER — ACETAMINOPHEN 325 MG PO TABS
650.0000 mg | ORAL_TABLET | Freq: Once | ORAL | Status: AC
  Administered 2022-12-28: 650 mg via ORAL
  Filled 2022-12-28: qty 2

## 2022-12-28 MED ORDER — TRASTUZUMAB-DKST CHEMO 150 MG IV SOLR
6.0000 mg/kg | Freq: Once | INTRAVENOUS | Status: AC
  Administered 2022-12-28: 210 mg via INTRAVENOUS
  Filled 2022-12-28: qty 10

## 2022-12-28 MED ORDER — SODIUM CHLORIDE 0.9% FLUSH
10.0000 mL | INTRAVENOUS | Status: DC | PRN
  Administered 2022-12-28: 10 mL

## 2022-12-28 MED ORDER — SODIUM CHLORIDE 0.9 % IV SOLN
420.0000 mg | Freq: Once | INTRAVENOUS | Status: AC
  Administered 2022-12-28: 420 mg via INTRAVENOUS
  Filled 2022-12-28: qty 14

## 2022-12-28 MED ORDER — FAMOTIDINE 20 MG IN NS 100 ML IVPB
20.0000 mg | Freq: Once | INTRAVENOUS | Status: AC
  Administered 2022-12-28: 20 mg via INTRAVENOUS
  Filled 2022-12-28: qty 20

## 2022-12-28 NOTE — Assessment & Plan Note (Signed)
-  Diagnosed in 06/2022 --PET scan from 08/03/2022 showed diffuse bone mets  -she underwent cervical laminectomy and fusion by Dr. Thomas on 08/17/2022, biopsy confirmed metastatic breast cancer, ER/PR negative, HER2 positive. -due to cervical cord compression with myelopathy, she underwent a second cervical spine surgery on September 06, 2022, and completed inpatient rehabitation. -She has completed brain, cervical and lumbar spine radiation in Feb 2024 -She was recently hospitalized for altered mental status and confusion, probably secondary to medication -She started first line systemic chemotherapy with weekly Taxol and trastuzumab/Perjeta on 10/25/22. She has tolerated well so far and her pain has improved overall  

## 2022-12-28 NOTE — Progress Notes (Signed)
[facility]   [phone] [phone]   Clinic Follow up Note   Patient Care Team: Ardith Dark, MD as PCP - General (Family Medicine) Almond Lint, MD as Consulting Physician (General Surgery) Malachy Mood, MD as Consulting Physician (Hematology) Lonie Peak, MD as Attending Physician (Radiation Oncology) Pollyann Samples, NP as Nurse Practitioner (Nurse Practitioner)  Date of Service:  12/28/2022  CHIEF COMPLAINT: f/u of left breast cancer and Right    CURRENT THERAPY:  Zometa q3 months/   Paclitaxel D1,8,15 + Trastuzumabd D1 + Pertuzumab D1 q21d x 8 cycles /TrastuzumabD1 +Pertuzumab D1 q21 d x4 cycles starting 10/19/2022    ASSESSMENT:  Martha [name] is a 62 y.o. female with   Metastatic breast cancer to bone and brain -Diagnosed in 06/2022 --PET scan from 08/03/2022 showed diffuse bone mets  -she underwent cervical laminectomy and fusion by Dr. Maisie Fus on 08/17/2022, biopsy confirmed metastatic breast cancer, ER/PR negative, HER2 positive. -due to cervical cord compression with myelopathy, she underwent a second cervical spine surgery on September 06, 2022, and completed inpatient rehabitation. -She has completed brain, cervical and lumbar spine radiation in Feb 2024 -She was recently hospitalized for altered mental status and confusion, probably secondary to medication -She started first line systemic chemotherapy with weekly Taxol and trastuzumab/Perjeta on 10/25/22. She has tolerated well so far and her pain has improved overall -If her PET scan shows good response, we may change her Taxol to 2 weeks on and 1 week off  Malignant neoplasm of lower-inner quadrant of left breast in female, estrogen receptor positive ([name]) -She was diagnosed in 12/2017. She is s/p left breast lumpectomy and adjuvant radiation.  -She started anti-estrogen therapy with letrozole on 05/2018. Tolerating well with no issues -lost f/u after visit in 04/2020 until  her recurrence in 07/2022     Cancer related pain -She is on low-dose oxycodone, she has developed a significant pain in the right rib cage, probably related to her bone metastasis  -f/u with palliative care clinic NP Nikki        PLAN: -lab reviewed. -PET scan schedule for 5/6 -proceed with  Taxol, labs adequate for treatment. -labs/flush and f/u  on 5/9  SUMMARY OF ONCOLOGIC HISTORY: Oncology History Overview Note  Cancer Staging Malignant neoplasm of lower-inner quadrant of left breast in female, estrogen receptor positive ([name]) Staging form: Breast, AJCC 8th Edition - Clinical stage from 01/16/2018: Stage IA (cT1c, cN0, cM0, G2, ER+, PR+, HER2-) - Signed by Malachy Mood, MD on 01/23/2018 - Pathologic: Stage IA (pT1c, pN1, cM0, G1, ER+, PR+, HER2-) - Signed by Lonie Peak, MD on 04/09/2018     Malignant neoplasm of lower-inner quadrant of left breast in female, estrogen receptor positive ([name])  01/15/2018 Mammogram   Diagnositc Mammogram 01/15/18  IMPRESSION: 1. Suspicious 1.2 x 1.4 x 1.3 cm mixed echogenicity mass left breast 7 o'clock position retroareolar location at the site of palpable concern.. 2. Indeterminate Within the left breast 7:30 o'clock retroareolar location, adjacent to the palpable mass, is a 0.5 x 0.4 x 0.5 cm oval circumscribed hypoechoic mass. 3. Indeterminate calcifications within the lateral left breast. Location of these calcifications is not definitely confirmed on the true lateral view.    01/16/2018 Initial Biopsy   Diagnosis 01/16/18 1. Breast, left, needle core biopsy, 7:30 o'clock (ribbon clip) - FIBROCYSTIC CHANGES WITH SCLEROSING ADENOSIS AND CALCIFICATIONS. - FIBROADENOMATOID CHANGE. - NO MALIGNANCY IDENTIFIED. 2. Breast, left, needle core biopsy, 7 o'clock position (coil clip) -  INVASIVE MAMMARY CARCINOMA, MSBR GRADE I/II. - SEE MICROSCOPIC DESCRIPTION Microscopic Comment  ADDENDUM: Immunohistochemistry for E-Cadherin is strongly  positive in the tumor consistent with ductal carcinoma. (JDP:ah 01/17/18)   01/16/2018 Receptors her2   Estrogen Receptor: 100%, POSITIVE, STRONG STAINING INTENSITY Progesterone Receptor: 50%, POSITIVE, STRONG STAINING INTENSITY Proliferation Marker Ki67: 20% HER2 Negative   01/16/2018 Cancer Staging   Staging form: Breast, AJCC 8th Edition - Clinical stage from 01/16/2018: Stage IA (cT1c, cN0, cM0, G2, ER+, PR+, HER2-) - Signed by Malachy Mood, MD on 01/23/2018   01/22/2018 Initial Diagnosis   Malignant neoplasm of lower-inner quadrant of left breast in female, estrogen receptor positive ([name])   02/21/2018 Surgery    LEFT BREAST LUMPECTOMY WITH AXILLARY LYMPH NODE BIOPSY by Dr. Donell Beers  02/21/18   02/21/2018 Pathology Results   Diagnosis 02/21/18 1. Breast, lumpectomy, Left - INVASIVE DUCTAL CARCINOMA, GRADE I, 1.6 CM. - DUCTAL CARCINOMA IN SITU, INTERMEDIATE NUCLEAR GRADE. - ANTERIOR AND MEDIAL RESECTION MARGINS ARE POSITIVE FOR CARCINOMA. - NEGATIVE FOR LYMPHOVASCULAR OR PERINEURAL INVASION. - BACKGROUND BREAST TISSUE WITH FIBROCYSTIC CHANGE, INCLUDING SCLEROSING ADENOSIS. - BIOPSY SITE CHANGES. - SEE ONCOLOGY TABLE. 2. Lymph node, sentinel, biopsy, Left Axillary #1 - METASTATIC BREAST CARCINOMA TO A LYMPH NODE, 1.0 CM IN GREATEST DIMENSION, WITH EXTRANODAL EXTENSION (1/1). 3. Lymph node, sentinel, biopsy, Left Axillary #2 - LYMPH NODE, NEGATIVE FOR CARCINOMA (0/1).    02/21/2018 Miscellaneous   Mammaprint 02/21/18 Low Risk with 10-year risk of recurrnce at 10% -No potential signifcant chemotherapy benefit   03/20/2018 Pathology Results   RE-EXCISION OF BREAST LUMPECTOMY by Dr. Donell Beers  Diagnosis 03/20/18 1. Breast, excision, Left new anterior margin - FIBROCYSTIC CHANGES WITH ADENOSIS AND CALCIFICATIONS. - HEALING BIOPSY SITE. - THERE IS NO EVIDENCE OF MALIGNANCY. 2. Breast, excision, Left new medial margin - FIBROCYSTIC CHANGES WITH ADENOSIS AND CALCIFICATIONS. - HEALING BIOPSY  SITE. - THERE IS NO EVIDENCE OF MALIGNANCY. Microscopic Comment 1. -2. The surgical resection margin(s) of the specimen were inked and microscopically evaluated. (JBK:kh 03-22-18)   04/09/2018 Cancer Staging   Staging form: Breast, AJCC 8th Edition - Pathologic: Stage IA (pT1c, pN1, cM0, G1, ER+, PR+, HER2-) - Signed by Lonie Peak, MD on 04/09/2018   04/22/2018 - 06/03/2018 Radiation Therapy   Radaiton with Dr. Basilio Cairo 04/22/18-06/03/18   05/2018 -  Anti-estrogen oral therapy   Letrozole 2.5mg  started 05/2018    Survivorship   Per Santiago Glad, NP    05/04/2022 Imaging    IMPRESSION: Cervical spondylosis, as described.   Nonspecific straightening of the expected cervical lordosis.   07/24/2022 Imaging    IMPRESSION: 1. Extensive osseous metastatic disease with pathologic fracture at the base of dens and C2 right lateral mass. Extraosseous tumor at C1 and C2 likely impinging on the right C2 and C3 nerve roots. 2. Degenerative cord impingement at C4-5 to C6-7. Biforaminal impingement at C5-6 and C6-7.   08/03/2022 PET scan    IMPRESSION: 1. Large volume osseous metastasis. 2. Low right cervical and probable right axillary nodal metastasis. 3. Subtle heterogeneous activity throughout the liver with suggestion of small liver lesions (likely new compared to chest CT of 07/16/2020). Findings are overall moderately suspicious for hepatic metastasis. Pre and post contrast abdominal MRI (preferred) or CT could confirm. 4. Right-sided pleural thickening and trace pleural fluid. Right base airspace disease is favored to represent chronic atelectasis. 5. Aortic atherosclerosis (ICD10-I70.0) and emphysema (ICD10-J43.9).     08/11/2022 Genetic Testing   Negative genetic testing on the CancerNext-Expanded+RNAinsight panel.  The report date is August 11, 2022.  The CancerNext-Expanded gene panel offered by [facility] and includes sequencing and rearrangement analysis for the  following 77 genes: AIP, ALK, APC*, ATM*, AXIN2, BAP1, BARD1, BLM, BMPR1A, BRCA1*, BRCA2*, BRIP1*, CDC73, CDH1*, CDK4, CDKN1B, CDKN2A, CHEK2*, CTNNA1, DICER1, FANCC, FH, FLCN, GALNT12, KIF1B, LZTR1, MAX, MEN1, MET, MLH1*, MSH2*, MSH3, MSH6*, MUTYH*, NBN, NF1*, NF2, NTHL1, PALB2*, PHOX2B, PMS2*, POT1, PRKAR1A, PTCH1, PTEN*, RAD51C*, RAD51D*, RB1, RECQL, RET, SDHA, SDHAF2, SDHB, SDHC, SDHD, SMAD4, SMARCA4, SMARCB1, SMARCE1, STK11, SUFU, TMEM127, TP53*, TSC1, TSC2, VHL and XRCC2 (sequencing and deletion/duplication); EGFR, EGLN1, HOXB13, KIT, MITF, PDGFRA, POLD1, and POLE (sequencing only); EPCAM and GREM1 (deletion/duplication only). DNA and RNA analyses performed for * genes.    Lobular carcinoma in situ (LCIS) of right breast  01/20/2020 Mammogram   Diagnostic Mammogram 01/20/20 IMPRESSION: 1.  Stable post lumpectomy changes of the left breast.   2. Suspicious microcalcifications over the right upper outer quadrant spanning 3.6 cm.   02/02/2020 Initial Biopsy   Diagnosis 02/02/20 Breast, right, needle core biopsy, upper outer quadrant, x clip - LOBULAR CARCINOMA IN SITU WITH PLEOMORPHIC FEATURES AND CALCIFICATIONS, INVOLVING ADENOSIS. SEE NOTE Diagnosis Note Immunohistochemical stain for E-cadherin is negative in the lesional cells, consistent with a lobular phenotype. Immunostains for p63, SMM 1 and calponin do not show evidence of invasive carcinoma.    02/04/2020 Initial Diagnosis   Lobular carcinoma in situ (LCIS) of right breast   03/18/2020 Surgery   RIGHT BREAST LUMPECTOMY WITH RADIOACTIVE SEED LOCALIZATION by Dr Alvira Monday    03/18/2020 Pathology Results   FINAL MICROSCOPIC DIAGNOSIS:   A. BREAST, RIGHT, LUMPECTOMY:  - Pleomorphic lobular carcinoma in situ with calcifications and  underlying complex sclerosing lesion, adenosis and fibroadenomatoid  change.  - Margins of resection are not involved (Closest margins: < 1 mm,  anterior, posterior, inferior and medial).  - Biopsy site.     COMMENT:   P63, Calponin and SMM-1 demonstrate the presence of myoepithelium in the  select focus.    Metastatic breast cancer to bone and brain  05/04/2022 Imaging    IMPRESSION: Cervical spondylosis, as described.   Nonspecific straightening of the expected cervical lordosis.   07/24/2022 Imaging    IMPRESSION: 1. Extensive osseous metastatic disease with pathologic fracture at the base of dens and C2 right lateral mass. Extraosseous tumor at C1 and C2 likely impinging on the right C2 and C3 nerve roots. 2. Degenerative cord impingement at C4-5 to C6-7. Biforaminal impingement at C5-6 and C6-7.   08/02/2022 Initial Diagnosis   Cancer, metastatic to bone ([name])   08/03/2022 PET scan    IMPRESSION: 1. Large volume osseous metastasis. 2. Low right cervical and probable right axillary nodal metastasis. 3. Subtle heterogeneous activity throughout the liver with suggestion of small liver lesions (likely new compared to chest CT of 07/16/2020). Findings are overall moderately suspicious for hepatic metastasis. Pre and post contrast abdominal MRI (preferred) or CT could confirm. 4. Right-sided pleural thickening and trace pleural fluid. Right base airspace disease is favored to represent chronic atelectasis. 5. Aortic atherosclerosis (ICD10-I70.0) and emphysema (ICD10-J43.9).     10/26/2022 -  Chemotherapy   Patient is on Treatment Plan : BREAST Paclitaxel D1,8,15 + Trastuzumab D1 + Pertuzumab D1 q21d x 8 cycles / Trastuzumab D1 + Pertuzumab D1 q21d x 4 cycles        INTERVAL HISTORY:  Martha [name] is here for a follow up of left breast cancer and Right  . She was last  seen by me on 12/14/2022. She presents to the clinic accompanied by husband. Pt states she has no headaches, some dizziness. Pt denies having any pain. Pt activity is coming along. She was discharge from therapy yesterday. Pt does have numbness and tingling from her spinal, not chemo. Pt has some  diarrhea and has been taking Imodium. Pt has some fatigue after Infusion.     All other systems were reviewed with the patient and are negative.  MEDICAL HISTORY:  Past Medical History:  Diagnosis Date   Anxiety    Cancer ([name]) 2022   right breast LCIS   Cancer ([name]) 2019   left breast   Depression    Dysrhythmia    SVT, s/p ablation ~ 2012 in at [facility]   Ectopic pregnancy    Family history of breast cancer    Family history of melanoma    History of radiation therapy 04/22/18-06/03/18   Left Breast, left SCV, axilla 50 Gy in 25 fractions, Left breast boost 10 Gy in 5 fractions.    Hypothyroidism    Personal history of radiation therapy    PONV (postoperative nausea and vomiting)    Thyroid disease     SURGICAL HISTORY: Past Surgical History:  Procedure Laterality Date   APPENDECTOMY     BILATERAL SALPINGECTOMY     BREAST BIOPSY Right 2014   fibroadenoma   BREAST BIOPSY Left 01/16/2018   BREAST BIOPSY Right 02/02/2020   LCIS   BREAST BIOPSY Right 08/04/2020   x2 LCIS   BREAST BIOPSY Right 08/13/2020   x2   BREAST EXCISIONAL BIOPSY Left 1990   benign   BREAST EXCISIONAL BIOPSY Right 02/02/2020   LCIS   BREAST LUMPECTOMY Left 01/22/2018   BREAST LUMPECTOMY WITH AXILLARY LYMPH NODE BIOPSY Left 02/21/2018   Procedure: LEFT BREAST LUMPECTOMY WITH AXILLARY LYMPH NODE BIOPSY;  Surgeon: Almond Lint, MD;  Location: [facility] OR;  Service: General;  Laterality: Left;   BREAST LUMPECTOMY WITH RADIOACTIVE SEED LOCALIZATION Right 03/18/2020   Procedure: RIGHT BREAST LUMPECTOMY WITH RADIOACTIVE SEED LOCALIZATION;  Surgeon: Almond Lint, MD;  Location: [facility] OR;  Service: General;  Laterality: Right;   BREAST SURGERY Left 1993   cyst removed   ENDOMETRIAL ABLATION     EYE SURGERY     GLAUCOMA SURGERY Bilateral    IR IMAGING GUIDED PORT INSERTION  10/16/2022   POSTERIOR CERVICAL FUSION/FORAMINOTOMY N/A 08/17/2022   Procedure: Cervical One-Cervical Four POSTERIOR  CERVICAL FUSION,  Cervical One LAMINECTOMY REDUCTION OF Cervical Two Fracture, Biopsy of Right Cerical Two Pars  Lesion;  Surgeon: Bedelia Person, MD;  Location: [facility] OR;  Service: Neurosurgery;  Laterality: N/A;   POSTERIOR CERVICAL FUSION/FORAMINOTOMY N/A 09/06/2022   Procedure: Posterior Cervical Fusion, Foraminotomy , Cervical Five-Six, Cervical Six-Seven; Extension of  fusion Cervical Four- Thoracic One;  Surgeon: Bedelia Person, MD;  Location: [facility] OR;  Service: Neurosurgery;  Laterality: N/A;   RADIOACTIVE SEED GUIDED EXCISIONAL BREAST BIOPSY Right 04/28/2021   Procedure: RADIOACTIVE SEED GUIDED EXCISIONAL RIGHT BREAST BIOPSY X2;  Surgeon: Almond Lint, MD;  Location: [facility];  Service: General;  Laterality: Right;   RE-EXCISION OF BREAST LUMPECTOMY Left 03/20/2018   Procedure: RE-EXCISION OF BREAST LUMPECTOMY;  Surgeon: Almond Lint, MD;  Location: [facility];  Service: General;  Laterality: Left;    I have reviewed the social history and family history with the patient and they are unchanged from previous note.  ALLERGIES:  is allergic to morphine and related.  MEDICATIONS:  Current Outpatient Medications  Medication Sig Dispense Refill   acetaminophen (TYLENOL) 500 MG tablet Take 2 tablets (1,000 mg total) by mouth every 8 (eight) hours. 30 tablet 0   citalopram (CELEXA) 40 MG tablet Take 1 tablet (40 mg total) by mouth daily. 30 tablet 3   levothyroxine (SYNTHROID) 75 MCG tablet Take 1 tablet (75 mcg total) by mouth daily before breakfast. 30 tablet 0   lidocaine (XYLOCAINE) 2 % solution Patient: Mix 1part 2% viscous lidocaine, 1part H20. Swallow 10mL of diluted mixture, before meals and at bedtime, up to QID 100 mL 3   lidocaine-prilocaine (EMLA) cream Apply to affected area once (Patient taking differently: Apply 1 Application topically daily as needed (burning).) 30 g 3   LORazepam (ATIVAN) 0.5 MG tablet Take 1 tablet (0.5 mg total) by  mouth every 6 (six) hours as needed for anxiety (agitation). 30 tablet 0   Mouthwashes (MOUTH RINSE) LIQD solution 15 mLs by Mouth Rinse route as needed (for oral care).  0   ondansetron (ZOFRAN) 8 MG tablet Take 1 tablet (8 mg total) by mouth every 8 (eight) hours as needed for nausea or vomiting. 20 tablet 1   OVER THE COUNTER MEDICATION Take 1 tablet by mouth daily. Protandim Supplement     oxyCODONE (OXY IR/ROXICODONE) 5 MG immediate release tablet Take 0.5-1 tablets (2.5-5 mg total) by mouth every 4 (four) hours as needed for moderate pain or severe pain (dyspnea). 30 tablet 0   pregabalin (LYRICA) 25 MG capsule Take 1 capsule (25 mg total) by mouth 2 (two) times daily. 60 capsule 2   senna-docusate (SENOKOT-S) 8.6-50 MG tablet Take 1 tablet by mouth 2 (two) times daily. 60 tablet 0   No current facility-administered medications for this visit.    PHYSICAL EXAMINATION: ECOG PERFORMANCE STATUS: 2 - Symptomatic, <50% confined to bed  Vitals:   12/28/22 0821  BP: 94/70  Pulse: 88  Resp: 18  Temp: 97.7 F (36.5 C)  SpO2: 100%   Wt Readings from Last 3 Encounters:  12/28/22 75 lb 11.2 oz (34.3 kg)  12/21/22 75 lb 9 oz (34.3 kg)  12/14/22 74 lb 4.8 oz (33.7 kg)     GENERAL:alert, no distress and comfortable SKIN: skin color normal, no rashes or significant lesions EYES: normal, Conjunctiva are pink and non-injected, sclera clear  NEURO: alert & oriented x 3 with fluent speech LABORATORY DATA:  I have reviewed the data as listed    Latest Ref Rng & Units 12/28/2022    7:53 AM 12/21/2022    7:43 AM 12/14/2022   10:26 AM  CBC  WBC 4.0 - 10.5 K/uL 3.2  2.9  3.5   Hemoglobin 12.0 - 15.0 g/dL 16.1  09.6  04.5   Hematocrit 36.0 - 46.0 % 34.8  35.8  33.8   Platelets 150 - 400 K/uL 263  283  300         Latest Ref Rng & Units 12/21/2022    7:43 AM 12/14/2022   10:26 AM 12/07/2022   10:19 AM  CMP  Glucose 70 - 99 mg/dL 409  811  914   BUN 8 - 23 mg/dL 16  13  7    Creatinine 0.44  - 1.00 mg/dL <7.82  <9.56  <2.13   Sodium 135 - 145 mmol/L 137  135  137   Potassium 3.5 - 5.1 mmol/L 3.3  3.6  3.2   Chloride 98 - 111 mmol/L 101  99  102  CO2 22 - 32 mmol/L 30  31  29    Calcium 8.9 - 10.3 mg/dL 9.0  9.0  8.7   Total Protein 6.5 - 8.1 g/dL 5.8  5.9  6.2   Total Bilirubin 0.3 - 1.2 mg/dL 0.7  0.7  0.6   Alkaline Phos 38 - 126 U/L 151  221  335   AST 15 - 41 U/L 16  15  14    ALT 0 - 44 U/L 12  11  11        RADIOGRAPHIC STUDIES: I have personally reviewed the radiological images as listed and agreed with the findings in the report. No results found.    Orders Placed This Encounter  Procedures   CBC with Differential (Cancer Center Only)    Standing Status:   Future    Standing Expiration Date:   01/18/2024   CMP (Cancer Center only)    Standing Status:   Future    Standing Expiration Date:   01/18/2024   CBC with Differential (Cancer Center Only)    Standing Status:   Future    Standing Expiration Date:   01/25/2024   CMP (Cancer Center only)    Standing Status:   Future    Standing Expiration Date:   01/25/2024   CBC with Differential (Cancer Center Only)    Standing Status:   Future    Standing Expiration Date:   02/01/2024   CMP (Cancer Center only)    Standing Status:   Future    Standing Expiration Date:   02/01/2024   CBC with Differential (Cancer Center Only)    Standing Status:   Future    Standing Expiration Date:   02/08/2024   CMP (Cancer Center only)    Standing Status:   Future    Standing Expiration Date:   02/08/2024   CBC with Differential (Cancer Center Only)    Standing Status:   Future    Standing Expiration Date:   02/15/2024   CMP (Cancer Center only)    Standing Status:   Future    Standing Expiration Date:   02/15/2024   CBC with Differential (Cancer Center Only)    Standing Status:   Future    Standing Expiration Date:   02/22/2024   CMP (Cancer Center only)    Standing Status:   Future    Standing Expiration Date:   02/22/2024    All questions were answered. The patient knows to call the clinic with any problems, questions or concerns. No barriers to learning was detected. The total time spent in the appointment was 30 minutes.     Malachy Mood, MD 12/28/2022   Carolin Coy, CMA, am acting as scribe for Malachy Mood, MD.   I have reviewed the above documentation for accuracy and completeness, and I agree with the above.

## 2022-12-28 NOTE — Assessment & Plan Note (Signed)
--  She was diagnosed in 12/2017. She is s/p left breast lumpectomy and adjuvant radiation.  -She started anti-estrogen therapy with letrozole on 05/2018. Tolerating well with no issues -lost f/u after visit in 04/2020 until her recurrence in 07/2022  

## 2022-12-28 NOTE — Assessment & Plan Note (Signed)
-  She is on low-dose oxycodone, she has developed a significant pain in the right rib cage, probably related to her bone metastasis  -f/u with palliative care clinic NP Nikki  

## 2022-12-28 NOTE — Patient Instructions (Signed)
[facility]  Discharge Instructions: Thank you for choosing [facility] to provide your oncology and hematology care.   If you have a lab appointment with the Cancer Center, please go directly to the Cancer Center and check in at the registration area.   Wear comfortable clothing and clothing appropriate for easy access to any Portacath or PICC line.   We strive to give you quality time with your provider. You may need to reschedule your appointment if you arrive late (15 or more minutes).  Arriving late affects you and other patients whose appointments are after yours.  Also, if you miss three or more appointments without notifying the office, you may be dismissed from the clinic at the provider's discretion.      For prescription refill requests, have your pharmacy contact our office and allow 72 hours for refills to be completed.    Today you received the following chemotherapy and/or immunotherapy agents: Trastuzumab, Pertuzumab, and Paclitaxel       To help prevent nausea and vomiting after your treatment, we encourage you to take your nausea medication as directed.  BELOW ARE SYMPTOMS THAT SHOULD BE REPORTED IMMEDIATELY: *FEVER GREATER THAN 100.4 F (38 C) OR HIGHER *CHILLS OR SWEATING *NAUSEA AND VOMITING THAT IS NOT CONTROLLED WITH YOUR NAUSEA MEDICATION *UNUSUAL SHORTNESS OF BREATH *UNUSUAL BRUISING OR BLEEDING *URINARY PROBLEMS (pain or burning when urinating, or frequent urination) *BOWEL PROBLEMS (unusual diarrhea, constipation, pain near the anus) TENDERNESS IN MOUTH AND THROAT WITH OR WITHOUT PRESENCE OF ULCERS (sore throat, sores in mouth, or a toothache) UNUSUAL RASH, SWELLING OR PAIN  UNUSUAL VAGINAL DISCHARGE OR ITCHING   Items with * indicate a potential emergency and should be followed up as soon as possible or go to the Emergency Department if any problems should occur.  Please show the CHEMOTHERAPY ALERT CARD or  IMMUNOTHERAPY ALERT CARD at check-in to the Emergency Department and triage nurse.  Should you have questions after your visit or need to cancel or reschedule your appointment, please contact [facility]  Dept: [phone]  and follow the prompts.  Office hours are 8:00 a.m. to 4:30 p.m. Monday - Friday. Please note that voicemails left after 4:00 p.m. may not be returned until the following business day.  We are closed weekends and major holidays. You have access to a nurse at all times for urgent questions. Please call the main number to the clinic Dept: [phone] and follow the prompts.   For any non-urgent questions, you may also contact your provider using MyChart. We now offer e-Visits for anyone 41 and older to request care online for non-urgent symptoms. For details visit [email or web address].   Also download the MyChart app! Go to the app store, search "MyChart", open the app, select [facility], and log in with your MyChart username and password.

## 2023-01-01 ENCOUNTER — Encounter (HOSPITAL_COMMUNITY)
Admission: RE | Admit: 2023-01-01 | Discharge: 2023-01-01 | Disposition: A | Discharge status: Home / Self Care | Payer: BC Managed Care – PPO | Source: Ambulatory Visit | Attending: Hematology | Admitting: Hematology

## 2023-01-01 DIAGNOSIS — C50919 Malignant neoplasm of unspecified site of unspecified female breast: Secondary | ICD-10-CM | POA: Diagnosis not present

## 2023-01-01 DIAGNOSIS — Z170 Estrogen receptor positive status [ER+]: Secondary | ICD-10-CM | POA: Insufficient documentation

## 2023-01-01 DIAGNOSIS — C50312 Malignant neoplasm of lower-inner quadrant of left female breast: Secondary | ICD-10-CM | POA: Insufficient documentation

## 2023-01-01 LAB — GLUCOSE, CAPILLARY: Glucose-Capillary: 119 mg/dL — ABNORMAL HIGH (ref 70–99)

## 2023-01-01 MED ORDER — FLUDEOXYGLUCOSE F - 18 (FDG) INJECTION
5.0000 | Freq: Once | INTRAVENOUS | Status: AC | PRN
  Administered 2023-01-01: 4.99 via INTRAVENOUS

## 2023-01-02 NOTE — Progress Notes (Unsigned)
Palliative Medicine [facility]  Telephone:([phone]) [phone] Fax:([phone]) [phone]   Name: Martha [name] Date: 01/02/2023 MRN: [id number]  DOB: 1960-12-25  Patient Care Team: Ardith Dark, MD as PCP - General (Family Medicine) Almond Lint, MD as Consulting Physician (General Surgery) Malachy Mood, MD as Consulting Physician (Hematology) Lonie Peak, MD as Attending Physician (Radiation Oncology) Pollyann Samples, NP as Nurse Practitioner (Nurse Practitioner)    INTERVAL HISTORY: Martha [name] is a 62 y.o. female with oncologic medical history including ER+ breast cancer (01/2018), right breast cancer (04/2021), now with metastatic disease progression involving brain and liver, pathological fractures with osseous mets s/p brain radiation. .  Palliative ask to see for symptom management and goals of care.   SOCIAL HISTORY:    [name] reports that she has been smoking cigarettes. She has a 20.00 pack-year smoking history. She has never used smokeless tobacco. She reports that she does not currently use alcohol after a past usage of about 28.0 standard drinks of alcohol per week. She reports that she does not use drugs.  ADVANCE DIRECTIVES:  Has documents on file  CODE STATUS: DNR  PAST MEDICAL HISTORY: Past Medical History:  Diagnosis Date   Anxiety    Cancer ([name]) 2022   right breast LCIS   Cancer ([name]) 2019   left breast   Depression    Dysrhythmia    SVT, s/p ablation ~ 2012 in at [facility]   Ectopic pregnancy    Family history of breast cancer    Family history of melanoma    History of radiation therapy 04/22/18-06/03/18   Left Breast, left SCV, axilla 50 Gy in 25 fractions, Left breast boost 10 Gy in 5 fractions.    Hypothyroidism    Personal history of radiation therapy    PONV (postoperative nausea and vomiting)    Thyroid disease     ALLERGIES:  is allergic to morphine and related.  MEDICATIONS:  Current  Outpatient Medications  Medication Sig Dispense Refill   acetaminophen (TYLENOL) 500 MG tablet Take 2 tablets (1,000 mg total) by mouth every 8 (eight) hours. 30 tablet 0   citalopram (CELEXA) 40 MG tablet Take 1 tablet (40 mg total) by mouth daily. 30 tablet 3   levothyroxine (SYNTHROID) 75 MCG tablet Take 1 tablet (75 mcg total) by mouth daily before breakfast. 30 tablet 0   lidocaine (XYLOCAINE) 2 % solution Patient: Mix 1part 2% viscous lidocaine, 1part H20. Swallow 10mL of diluted mixture, before meals and at bedtime, up to QID 100 mL 3   lidocaine-prilocaine (EMLA) cream Apply to affected area once (Patient taking differently: Apply 1 Application topically daily as needed (burning).) 30 g 3   LORazepam (ATIVAN) 0.5 MG tablet Take 1 tablet (0.5 mg total) by mouth every 6 (six) hours as needed for anxiety (agitation). 30 tablet 0   Mouthwashes (MOUTH RINSE) LIQD solution 15 mLs by Mouth Rinse route as needed (for oral care).  0   ondansetron (ZOFRAN) 8 MG tablet Take 1 tablet (8 mg total) by mouth every 8 (eight) hours as needed for nausea or vomiting. 20 tablet 1   OVER THE COUNTER MEDICATION Take 1 tablet by mouth daily. Protandim Supplement     oxyCODONE (OXY IR/ROXICODONE) 5 MG immediate release tablet Take 0.5-1 tablets (2.5-5 mg total) by mouth every 4 (four) hours as needed for moderate pain or severe pain (dyspnea). 30 tablet 0   pregabalin (LYRICA) 25 MG capsule Take  1 capsule (25 mg total) by mouth 2 (two) times daily. 60 capsule 2   senna-docusate (SENOKOT-S) 8.6-50 MG tablet Take 1 tablet by mouth 2 (two) times daily. 60 tablet 0   No current facility-administered medications for this visit.    VITAL SIGNS: There were no vitals taken for this visit. There were no vitals filed for this visit.  Estimated body mass index is 13.41 kg/m as calculated from the following:   Height as of 12/28/22: 5\' 3"  (1.6 m).   Weight as of 12/28/22: 75 lb 11.2 oz (34.3 kg).   PERFORMANCE  STATUS (ECOG) : 3 - Symptomatic, >50% confined to bed   Physical Exam General: NAD, thin   Cardiovascular: regular rate and rhythm Pulmonary: clear ant fields Abdomen: soft, nontender, + bowel sounds Extremities: no edema, no joint deformities Skin: no rashes, muscle wasting  Neurological: alert, oriented x 4  IMPRESSION: I saw Martha [name] during infusion. No acute distress. Her husband is present. She shares appreciation of how well she is feeling and tolerating treatment. Is slowly increasing activity based on ability to do so. Denies nausea, vomiting, constipation. Some loose stools but controlled.   Decreased appetite/weight loss Much improved. Weight continues to increase.    Pain Martha [name] shares her pain is well controlled. Is not requiring oxycodone. Is tolerating Lyrica which she feels has provided the most relief. She is aware to notify palliative clinic with any changes in her pain or needs. No changes made at this time. We will continue to closely monitor and adjust medication regimen as needed.  We discussed her current illness and what it means in the larger context of her on-going co-morbidities. Natural disease trajectory and expectations were discussed. We discussed the importance of continued conversation with family and their medical providers regarding overall plan of care and treatment options, ensuring decisions are within the context of the patients values and GOCs.  PLAN: Continue intake of Ensure and soft, high-calorie foods. Biotene as needed for dry mouth. Continue Lyrica 25 mg twice daily. Tylenol and Oxycodone available as needed for pain. Not requiring  Protective skin barrier to sacral area. Ongoing goals of care discussions and symptom management Palliative will plan to see patient back in 2-4 weeks in collaboration to other oncology appointments.  Patient and husband knows to contact office sooner if needed.   Patient expressed understanding and was  in agreement with this plan. She also understands that She can call the clinic at any time with any questions, concerns, or complaints.      Any controlled substances utilized were prescribed in the context of palliative care. PDMP has been reviewed.   Visit consisted of counseling and education dealing with the complex and emotionally intense issues of symptom management and palliative care in the setting of serious and potentially life-threatening illness.Greater than 50%  of this time was spent counseling and coordinating care related to the above assessment and plan.  Willette Alma, AGPCNP-BC  Palliative Medicine Team/[facility]

## 2023-01-03 MED FILL — Dexamethasone Sodium Phosphate Inj 100 MG/10ML: INTRAMUSCULAR | Qty: 1 | Status: AC

## 2023-01-04 ENCOUNTER — Encounter: Payer: Self-pay | Admitting: Nurse Practitioner

## 2023-01-04 ENCOUNTER — Other Ambulatory Visit: Payer: Self-pay

## 2023-01-04 ENCOUNTER — Ambulatory Visit: Payer: BC Managed Care – PPO | Admitting: Hematology

## 2023-01-04 ENCOUNTER — Inpatient Hospital Stay: Payer: BC Managed Care – PPO

## 2023-01-04 ENCOUNTER — Inpatient Hospital Stay (HOSPITAL_BASED_OUTPATIENT_CLINIC_OR_DEPARTMENT_OTHER): Payer: BC Managed Care – PPO | Admitting: Nurse Practitioner

## 2023-01-04 VITALS — BP 126/82 | HR 84 | Temp 98.0°F | Resp 18 | Wt 76.0 lb

## 2023-01-04 DIAGNOSIS — R630 Anorexia: Secondary | ICD-10-CM | POA: Diagnosis not present

## 2023-01-04 DIAGNOSIS — C50312 Malignant neoplasm of lower-inner quadrant of left female breast: Secondary | ICD-10-CM | POA: Diagnosis not present

## 2023-01-04 DIAGNOSIS — Z515 Encounter for palliative care: Secondary | ICD-10-CM

## 2023-01-04 DIAGNOSIS — C7951 Secondary malignant neoplasm of bone: Secondary | ICD-10-CM

## 2023-01-04 DIAGNOSIS — Z95828 Presence of other vascular implants and grafts: Secondary | ICD-10-CM

## 2023-01-04 DIAGNOSIS — Z79899 Other long term (current) drug therapy: Secondary | ICD-10-CM | POA: Diagnosis not present

## 2023-01-04 DIAGNOSIS — R530 Neoplastic (malignant) related fatigue: Secondary | ICD-10-CM | POA: Diagnosis not present

## 2023-01-04 DIAGNOSIS — Z170 Estrogen receptor positive status [ER+]: Secondary | ICD-10-CM | POA: Diagnosis not present

## 2023-01-04 DIAGNOSIS — G893 Neoplasm related pain (acute) (chronic): Secondary | ICD-10-CM | POA: Diagnosis not present

## 2023-01-04 DIAGNOSIS — Z5112 Encounter for antineoplastic immunotherapy: Secondary | ICD-10-CM | POA: Diagnosis not present

## 2023-01-04 DIAGNOSIS — C7931 Secondary malignant neoplasm of brain: Secondary | ICD-10-CM | POA: Diagnosis not present

## 2023-01-04 DIAGNOSIS — Z923 Personal history of irradiation: Secondary | ICD-10-CM | POA: Diagnosis not present

## 2023-01-04 LAB — CBC WITH DIFFERENTIAL (CANCER CENTER ONLY)
Abs Immature Granulocytes: 0.01 10*3/uL (ref 0.00–0.07)
Basophils Absolute: 0 10*3/uL (ref 0.0–0.1)
Basophils Relative: 1 %
Eosinophils Absolute: 0 10*3/uL (ref 0.0–0.5)
Eosinophils Relative: 1 %
HCT: 35.1 % — ABNORMAL LOW (ref 36.0–46.0)
Hemoglobin: 11.9 g/dL — ABNORMAL LOW (ref 12.0–15.0)
Immature Granulocytes: 0 %
Lymphocytes Relative: 24 %
Lymphs Abs: 0.6 10*3/uL — ABNORMAL LOW (ref 0.7–4.0)
MCH: 34.5 pg — ABNORMAL HIGH (ref 26.0–34.0)
MCHC: 33.9 g/dL (ref 30.0–36.0)
MCV: 101.7 fL — ABNORMAL HIGH (ref 80.0–100.0)
Monocytes Absolute: 0.2 10*3/uL (ref 0.1–1.0)
Monocytes Relative: 7 %
Neutro Abs: 1.7 10*3/uL (ref 1.7–7.7)
Neutrophils Relative %: 67 %
Platelet Count: 263 10*3/uL (ref 150–400)
RBC: 3.45 MIL/uL — ABNORMAL LOW (ref 3.87–5.11)
RDW: 15.5 % (ref 11.5–15.5)
WBC Count: 2.6 10*3/uL — ABNORMAL LOW (ref 4.0–10.5)
nRBC: 0 % (ref 0.0–0.2)

## 2023-01-04 LAB — CMP (CANCER CENTER ONLY)
ALT: 26 U/L (ref 0–44)
AST: 25 U/L (ref 15–41)
Albumin: 3.9 g/dL (ref 3.5–5.0)
Alkaline Phosphatase: 134 U/L — ABNORMAL HIGH (ref 38–126)
Anion gap: 5 (ref 5–15)
BUN: 8 mg/dL (ref 8–23)
CO2: 32 mmol/L (ref 22–32)
Calcium: 8.7 mg/dL — ABNORMAL LOW (ref 8.9–10.3)
Chloride: 102 mmol/L (ref 98–111)
Creatinine: <0.3 mg/dL — ABNORMAL LOW (ref 0.44–1.00)
Glucose, Bld: 138 mg/dL — ABNORMAL HIGH (ref 70–99)
Potassium: 3.4 mmol/L — ABNORMAL LOW (ref 3.5–5.1)
Sodium: 139 mmol/L (ref 135–145)
Total Bilirubin: 0.5 mg/dL (ref 0.3–1.2)
Total Protein: 5.8 g/dL — ABNORMAL LOW (ref 6.5–8.1)

## 2023-01-04 MED ORDER — FAMOTIDINE 20 MG IN NS 100 ML IVPB
20.0000 mg | Freq: Once | INTRAVENOUS | Status: AC
  Administered 2023-01-04: 20 mg via INTRAVENOUS
  Filled 2023-01-04: qty 100

## 2023-01-04 MED ORDER — ZOLEDRONIC ACID 4 MG/100ML IV SOLN
4.0000 mg | Freq: Once | INTRAVENOUS | Status: AC
  Administered 2023-01-04: 4 mg via INTRAVENOUS
  Filled 2023-01-04: qty 100

## 2023-01-04 MED ORDER — SODIUM CHLORIDE 0.9% FLUSH
10.0000 mL | INTRAVENOUS | Status: DC | PRN
  Administered 2023-01-04: 10 mL

## 2023-01-04 MED ORDER — SODIUM CHLORIDE 0.9 % IV SOLN
80.0000 mg/m2 | Freq: Once | INTRAVENOUS | Status: AC
  Administered 2023-01-04: 102 mg via INTRAVENOUS
  Filled 2023-01-04: qty 17

## 2023-01-04 MED ORDER — SODIUM CHLORIDE 0.9 % IV SOLN
10.0000 mg | Freq: Once | INTRAVENOUS | Status: AC
  Administered 2023-01-04: 10 mg via INTRAVENOUS
  Filled 2023-01-04: qty 10

## 2023-01-04 MED ORDER — DIPHENHYDRAMINE HCL 50 MG/ML IJ SOLN
25.0000 mg | Freq: Once | INTRAMUSCULAR | Status: AC
  Administered 2023-01-04: 25 mg via INTRAVENOUS
  Filled 2023-01-04: qty 1

## 2023-01-04 MED ORDER — SODIUM CHLORIDE 0.9 % IV SOLN: Freq: Once | INTRAVENOUS | Status: AC

## 2023-01-04 MED ORDER — SODIUM CHLORIDE 0.9% FLUSH
10.0000 mL | Freq: Once | INTRAVENOUS | Status: AC
  Administered 2023-01-04: 10 mL

## 2023-01-04 MED ORDER — HEPARIN SOD (PORK) LOCK FLUSH 100 UNIT/ML IV SOLN
500.0000 [IU] | Freq: Once | INTRAVENOUS | Status: AC | PRN
  Administered 2023-01-04: 500 [IU]

## 2023-01-04 NOTE — Progress Notes (Signed)
Per Dr Mosetta Putt ok to proceed with zometa with corrected calcium 8.78

## 2023-01-04 NOTE — Patient Instructions (Signed)
[facility]  Discharge Instructions: Thank you for choosing [facility] to provide your oncology and hematology care.   If you have a lab appointment with the Cancer Center, please go directly to the Cancer Center and check in at the registration area.   Wear comfortable clothing and clothing appropriate for easy access to any Portacath or PICC line.   We strive to give you quality time with your provider. You may need to reschedule your appointment if you arrive late (15 or more minutes).  Arriving late affects you and other patients whose appointments are after yours.  Also, if you miss three or more appointments without notifying the office, you may be dismissed from the clinic at the provider's discretion.      For prescription refill requests, have your pharmacy contact our office and allow 72 hours for refills to be completed.    Today you received the following chemotherapy and/or immunotherapy agents Paclitaxel      To help prevent nausea and vomiting after your treatment, we encourage you to take your nausea medication as directed.  BELOW ARE SYMPTOMS THAT SHOULD BE REPORTED IMMEDIATELY: *FEVER GREATER THAN 100.4 F (38 C) OR HIGHER *CHILLS OR SWEATING *NAUSEA AND VOMITING THAT IS NOT CONTROLLED WITH YOUR NAUSEA MEDICATION *UNUSUAL SHORTNESS OF BREATH *UNUSUAL BRUISING OR BLEEDING *URINARY PROBLEMS (pain or burning when urinating, or frequent urination) *BOWEL PROBLEMS (unusual diarrhea, constipation, pain near the anus) TENDERNESS IN MOUTH AND THROAT WITH OR WITHOUT PRESENCE OF ULCERS (sore throat, sores in mouth, or a toothache) UNUSUAL RASH, SWELLING OR PAIN  UNUSUAL VAGINAL DISCHARGE OR ITCHING   Items with * indicate a potential emergency and should be followed up as soon as possible or go to the Emergency Department if any problems should occur.  Please show the CHEMOTHERAPY ALERT CARD or IMMUNOTHERAPY ALERT CARD at  check-in to the Emergency Department and triage nurse.  Should you have questions after your visit or need to cancel or reschedule your appointment, please contact [facility]  Dept: [phone]  and follow the prompts.  Office hours are 8:00 a.m. to 4:30 p.m. Monday - Friday. Please note that voicemails left after 4:00 p.m. may not be returned until the following business day.  We are closed weekends and major holidays. You have access to a nurse at all times for urgent questions. Please call the main number to the clinic Dept: [phone] and follow the prompts.   For any non-urgent questions, you may also contact your provider using MyChart. We now offer e-Visits for anyone 18 and older to request care online for non-urgent symptoms. For details visit [email or web address].   Also download the MyChart app! Go to the app store, search "MyChart", open the app, select , and log in with your MyChart username and password.   

## 2023-01-10 MED FILL — Dexamethasone Sodium Phosphate Inj 100 MG/10ML: INTRAMUSCULAR | Qty: 1 | Status: AC

## 2023-01-11 ENCOUNTER — Ambulatory Visit: Payer: BC Managed Care – PPO | Admitting: Hematology

## 2023-01-11 ENCOUNTER — Inpatient Hospital Stay (HOSPITAL_BASED_OUTPATIENT_CLINIC_OR_DEPARTMENT_OTHER): Payer: BC Managed Care – PPO | Admitting: Hematology

## 2023-01-11 ENCOUNTER — Inpatient Hospital Stay: Payer: BC Managed Care – PPO

## 2023-01-11 ENCOUNTER — Other Ambulatory Visit: Payer: Self-pay

## 2023-01-11 ENCOUNTER — Encounter: Payer: Self-pay | Admitting: Hematology

## 2023-01-11 ENCOUNTER — Telehealth: Payer: Self-pay

## 2023-01-11 ENCOUNTER — Inpatient Hospital Stay: Payer: BC Managed Care – PPO | Admitting: Nutrition

## 2023-01-11 VITALS — BP 125/78 | HR 82 | Resp 17

## 2023-01-11 VITALS — BP 103/74 | HR 97 | Temp 97.8°F | Resp 18 | Ht 63.0 in | Wt 75.9 lb

## 2023-01-11 DIAGNOSIS — C50312 Malignant neoplasm of lower-inner quadrant of left female breast: Secondary | ICD-10-CM | POA: Diagnosis not present

## 2023-01-11 DIAGNOSIS — Z79899 Other long term (current) drug therapy: Secondary | ICD-10-CM | POA: Diagnosis not present

## 2023-01-11 DIAGNOSIS — C7951 Secondary malignant neoplasm of bone: Secondary | ICD-10-CM

## 2023-01-11 DIAGNOSIS — C7931 Secondary malignant neoplasm of brain: Secondary | ICD-10-CM | POA: Diagnosis not present

## 2023-01-11 DIAGNOSIS — Z5112 Encounter for antineoplastic immunotherapy: Secondary | ICD-10-CM | POA: Diagnosis not present

## 2023-01-11 DIAGNOSIS — Z923 Personal history of irradiation: Secondary | ICD-10-CM | POA: Diagnosis not present

## 2023-01-11 DIAGNOSIS — G893 Neoplasm related pain (acute) (chronic): Secondary | ICD-10-CM

## 2023-01-11 DIAGNOSIS — Z170 Estrogen receptor positive status [ER+]: Secondary | ICD-10-CM | POA: Diagnosis not present

## 2023-01-11 LAB — CMP (CANCER CENTER ONLY)
ALT: 40 U/L (ref 0–44)
AST: 38 U/L (ref 15–41)
Albumin: 4 g/dL (ref 3.5–5.0)
Alkaline Phosphatase: 128 U/L — ABNORMAL HIGH (ref 38–126)
Anion gap: 4 — ABNORMAL LOW (ref 5–15)
BUN: 16 mg/dL (ref 8–23)
CO2: 30 mmol/L (ref 22–32)
Calcium: 8.8 mg/dL — ABNORMAL LOW (ref 8.9–10.3)
Chloride: 102 mmol/L (ref 98–111)
Creatinine: <0.3 mg/dL — ABNORMAL LOW (ref 0.44–1.00)
Glucose, Bld: 112 mg/dL — ABNORMAL HIGH (ref 70–99)
Potassium: 3.9 mmol/L (ref 3.5–5.1)
Sodium: 136 mmol/L (ref 135–145)
Total Bilirubin: 0.5 mg/dL (ref 0.3–1.2)
Total Protein: 5.9 g/dL — ABNORMAL LOW (ref 6.5–8.1)

## 2023-01-11 LAB — CBC WITH DIFFERENTIAL (CANCER CENTER ONLY)
Abs Immature Granulocytes: 0.03 10*3/uL (ref 0.00–0.07)
Basophils Absolute: 0 10*3/uL (ref 0.0–0.1)
Basophils Relative: 1 %
Eosinophils Absolute: 0 10*3/uL (ref 0.0–0.5)
Eosinophils Relative: 1 %
HCT: 34.8 % — ABNORMAL LOW (ref 36.0–46.0)
Hemoglobin: 11.9 g/dL — ABNORMAL LOW (ref 12.0–15.0)
Immature Granulocytes: 1 %
Lymphocytes Relative: 17 %
Lymphs Abs: 0.7 10*3/uL (ref 0.7–4.0)
MCH: 34.4 pg — ABNORMAL HIGH (ref 26.0–34.0)
MCHC: 34.2 g/dL (ref 30.0–36.0)
MCV: 100.6 fL — ABNORMAL HIGH (ref 80.0–100.0)
Monocytes Absolute: 0.3 10*3/uL (ref 0.1–1.0)
Monocytes Relative: 6 %
Neutro Abs: 3.2 10*3/uL (ref 1.7–7.7)
Neutrophils Relative %: 74 %
Platelet Count: 286 10*3/uL (ref 150–400)
RBC: 3.46 MIL/uL — ABNORMAL LOW (ref 3.87–5.11)
RDW: 15.1 % (ref 11.5–15.5)
WBC Count: 4.2 10*3/uL (ref 4.0–10.5)
nRBC: 0 % (ref 0.0–0.2)

## 2023-01-11 MED ORDER — SODIUM CHLORIDE 0.9 % IV SOLN: Freq: Once | INTRAVENOUS | Status: AC

## 2023-01-11 MED ORDER — FAMOTIDINE 20 MG IN NS 100 ML IVPB
20.0000 mg | Freq: Once | INTRAVENOUS | Status: AC
  Administered 2023-01-11: 20 mg via INTRAVENOUS
  Filled 2023-01-11: qty 100

## 2023-01-11 MED ORDER — HEPARIN SOD (PORK) LOCK FLUSH 100 UNIT/ML IV SOLN
500.0000 [IU] | Freq: Once | INTRAVENOUS | Status: AC | PRN
  Administered 2023-01-11: 500 [IU]

## 2023-01-11 MED ORDER — DIPHENHYDRAMINE HCL 50 MG/ML IJ SOLN
25.0000 mg | Freq: Once | INTRAMUSCULAR | Status: AC
  Administered 2023-01-11: 25 mg via INTRAVENOUS
  Filled 2023-01-11: qty 1

## 2023-01-11 MED ORDER — SODIUM CHLORIDE 0.9 % IV SOLN
10.0000 mg | Freq: Once | INTRAVENOUS | Status: AC
  Administered 2023-01-11: 10 mg via INTRAVENOUS
  Filled 2023-01-11: qty 10

## 2023-01-11 MED ORDER — SODIUM CHLORIDE 0.9 % IV SOLN
80.0000 mg/m2 | Freq: Once | INTRAVENOUS | Status: AC
  Administered 2023-01-11: 102 mg via INTRAVENOUS
  Filled 2023-01-11: qty 17

## 2023-01-11 MED ORDER — SODIUM CHLORIDE 0.9% FLUSH
10.0000 mL | INTRAVENOUS | Status: DC | PRN
  Administered 2023-01-11: 10 mL

## 2023-01-11 NOTE — Telephone Encounter (Signed)
Received Dental Clearance form [facility] for this patient. Placed original in the to be scanned file.

## 2023-01-11 NOTE — Assessment & Plan Note (Signed)
-  She is on low-dose oxycodone, she has developed a significant pain in the right rib cage, probably related to her bone metastasis  -f/u with palliative care clinic NP Nikki  

## 2023-01-11 NOTE — Progress Notes (Addendum)
[facility]   [phone] [phone]   Clinic Follow up Note   Patient Care Team: Ardith Dark, MD as PCP - General (Family Medicine) Almond Lint, MD as Consulting Physician (General Surgery) Malachy Mood, MD as Consulting Physician (Hematology) Lonie Peak, MD as Attending Physician (Radiation Oncology) Pollyann Samples, NP as Nurse Practitioner (Nurse Practitioner)  Date of Service:  01/11/2023  CHIEF COMPLAINT: f/u of  left breast cancer and Right      CURRENT THERAPY:   Zometa q3 months/   Paclitaxel D1,8,15 + Trastuzumabd D1 + Pertuzumab D1 q21d x 8 cycles /TrastuzumabD1 +Pertuzumab D1 q21 d x4 cycles starting 10/19/2022   ASSESSMENT:  Martha [name] is a 62 y.o. female with   Metastatic breast cancer to bone and brain -Diagnosed in 06/2022 --PET scan from 08/03/2022 showed diffuse bone mets  -she underwent cervical laminectomy and fusion by Dr. Maisie Fus on 08/17/2022, biopsy confirmed metastatic breast cancer, ER/PR negative, HER2 positive. -due to cervical cord compression with myelopathy, she underwent a second cervical spine surgery on September 06, 2022, and completed inpatient rehabitation. -She has completed brain, cervical and lumbar spine radiation in Feb 2024 -She was recently hospitalized for altered mental status and confusion, probably secondary to medication -She started first line systemic chemotherapy with weekly Taxol and trastuzumab/Perjeta on 10/25/22. She has tolerated well so far and her pain has improved overall -I personally reviewed her PET scan from Jan 01, 2023, and compared to her initial PET scan 5 months ago.  She has achieved complete response, no hypermetabolic uptake in diffuse bone lesions, no other new lesions.  She is clinically also doing much better.  Given her excellent response to chemo, we will change her Taxol to 2 weeks on and 1 week off  Malignant neoplasm of lower-inner quadrant of left breast in  female, estrogen receptor positive ([name]) -She was diagnosed in 12/2017. She is s/p left breast lumpectomy and adjuvant radiation.  -She started anti-estrogen therapy with letrozole on 05/2018. Tolerating well with no issues -lost f/u after visit in 04/2020 until her recurrence in 07/2022     Cancer related pain -She is on low-dose oxycodone, she has developed a significant pain in the right rib cage, probably related to her bone metastasis  -f/u with palliative care clinic NP Nikki     PLAN: -lab reviewed -Reviewed PET scan with pt in person, she has CR  -proceed with Taxol today -taxol treatment will change to  two weeks on and 1 week off due to good response -encourage pt to eat high calorie diet and exercise -  SUMMARY OF ONCOLOGIC HISTORY: Oncology History Overview Note  Cancer Staging Malignant neoplasm of lower-inner quadrant of left breast in female, estrogen receptor positive ([name]) Staging form: Breast, AJCC 8th Edition - Clinical stage from 01/16/2018: Stage IA (cT1c, cN0, cM0, G2, ER+, PR+, HER2-) - Signed by Malachy Mood, MD on 01/23/2018 - Pathologic: Stage IA (pT1c, pN1, cM0, G1, ER+, PR+, HER2-) - Signed by Lonie Peak, MD on 04/09/2018     Malignant neoplasm of lower-inner quadrant of left breast in female, estrogen receptor positive ([name])  01/15/2018 Mammogram   Diagnositc Mammogram 01/15/18  IMPRESSION: 1. Suspicious 1.2 x 1.4 x 1.3 cm mixed echogenicity mass left breast 7 o'clock position retroareolar location at the site of palpable concern.. 2. Indeterminate Within the left breast 7:30 o'clock retroareolar location, adjacent to the palpable mass, is a 0.5 x 0.4 x 0.5 cm oval circumscribed hypoechoic mass.  3. Indeterminate calcifications within the lateral left breast. Location of these calcifications is not definitely confirmed on the true lateral view.    01/16/2018 Initial Biopsy   Diagnosis 01/16/18 1. Breast, left, needle core biopsy, 7:30 o'clock (ribbon  clip) - FIBROCYSTIC CHANGES WITH SCLEROSING ADENOSIS AND CALCIFICATIONS. - FIBROADENOMATOID CHANGE. - NO MALIGNANCY IDENTIFIED. 2. Breast, left, needle core biopsy, 7 o'clock position (coil clip) - INVASIVE MAMMARY CARCINOMA, MSBR GRADE I/II. - SEE MICROSCOPIC DESCRIPTION Microscopic Comment  ADDENDUM: Immunohistochemistry for E-Cadherin is strongly positive in the tumor consistent with ductal carcinoma. (JDP:ah 01/17/18)   01/16/2018 Receptors her2   Estrogen Receptor: 100%, POSITIVE, STRONG STAINING INTENSITY Progesterone Receptor: 50%, POSITIVE, STRONG STAINING INTENSITY Proliferation Marker Ki67: 20% HER2 Negative   01/16/2018 Cancer Staging   Staging form: Breast, AJCC 8th Edition - Clinical stage from 01/16/2018: Stage IA (cT1c, cN0, cM0, G2, ER+, PR+, HER2-) - Signed by Malachy Mood, MD on 01/23/2018   01/22/2018 Initial Diagnosis   Malignant neoplasm of lower-inner quadrant of left breast in female, estrogen receptor positive ([name])   02/21/2018 Surgery    LEFT BREAST LUMPECTOMY WITH AXILLARY LYMPH NODE BIOPSY by Dr. Donell Beers  02/21/18   02/21/2018 Pathology Results   Diagnosis 02/21/18 1. Breast, lumpectomy, Left - INVASIVE DUCTAL CARCINOMA, GRADE I, 1.6 CM. - DUCTAL CARCINOMA IN SITU, INTERMEDIATE NUCLEAR GRADE. - ANTERIOR AND MEDIAL RESECTION MARGINS ARE POSITIVE FOR CARCINOMA. - NEGATIVE FOR LYMPHOVASCULAR OR PERINEURAL INVASION. - BACKGROUND BREAST TISSUE WITH FIBROCYSTIC CHANGE, INCLUDING SCLEROSING ADENOSIS. - BIOPSY SITE CHANGES. - SEE ONCOLOGY TABLE. 2. Lymph node, sentinel, biopsy, Left Axillary #1 - METASTATIC BREAST CARCINOMA TO A LYMPH NODE, 1.0 CM IN GREATEST DIMENSION, WITH EXTRANODAL EXTENSION (1/1). 3. Lymph node, sentinel, biopsy, Left Axillary #2 - LYMPH NODE, NEGATIVE FOR CARCINOMA (0/1).    02/21/2018 Miscellaneous   Mammaprint 02/21/18 Low Risk with 10-year risk of recurrnce at 10% -No potential signifcant chemotherapy benefit   03/20/2018 Pathology  Results   RE-EXCISION OF BREAST LUMPECTOMY by Dr. Donell Beers  Diagnosis 03/20/18 1. Breast, excision, Left new anterior margin - FIBROCYSTIC CHANGES WITH ADENOSIS AND CALCIFICATIONS. - HEALING BIOPSY SITE. - THERE IS NO EVIDENCE OF MALIGNANCY. 2. Breast, excision, Left new medial margin - FIBROCYSTIC CHANGES WITH ADENOSIS AND CALCIFICATIONS. - HEALING BIOPSY SITE. - THERE IS NO EVIDENCE OF MALIGNANCY. Microscopic Comment 1. -2. The surgical resection margin(s) of the specimen were inked and microscopically evaluated. (JBK:kh 03-22-18)   04/09/2018 Cancer Staging   Staging form: Breast, AJCC 8th Edition - Pathologic: Stage IA (pT1c, pN1, cM0, G1, ER+, PR+, HER2-) - Signed by Lonie Peak, MD on 04/09/2018   04/22/2018 - 06/03/2018 Radiation Therapy   Radaiton with Dr. Basilio Cairo 04/22/18-06/03/18   05/2018 -  Anti-estrogen oral therapy   Letrozole 2.5mg  started 05/2018    Survivorship   Per Santiago Glad, NP    05/04/2022 Imaging    IMPRESSION: Cervical spondylosis, as described.   Nonspecific straightening of the expected cervical lordosis.   07/24/2022 Imaging    IMPRESSION: 1. Extensive osseous metastatic disease with pathologic fracture at the base of dens and C2 right lateral mass. Extraosseous tumor at C1 and C2 likely impinging on the right C2 and C3 nerve roots. 2. Degenerative cord impingement at C4-5 to C6-7. Biforaminal impingement at C5-6 and C6-7.   08/03/2022 PET scan    IMPRESSION: 1. Large volume osseous metastasis. 2. Low right cervical and probable right axillary nodal metastasis. 3. Subtle heterogeneous activity throughout the liver with suggestion of small liver lesions (likely  new compared to chest CT of 07/16/2020). Findings are overall moderately suspicious for hepatic metastasis. Pre and post contrast abdominal MRI (preferred) or CT could confirm. 4. Right-sided pleural thickening and trace pleural fluid. Right base airspace disease is favored to represent  chronic atelectasis. 5. Aortic atherosclerosis (ICD10-I70.0) and emphysema (ICD10-J43.9).     08/11/2022 Genetic Testing   Negative genetic testing on the CancerNext-Expanded+RNAinsight panel.  The report date is August 11, 2022.  The CancerNext-Expanded gene panel offered by [facility] and includes sequencing and rearrangement analysis for the following 77 genes: AIP, ALK, APC*, ATM*, AXIN2, BAP1, BARD1, BLM, BMPR1A, BRCA1*, BRCA2*, BRIP1*, CDC73, CDH1*, CDK4, CDKN1B, CDKN2A, CHEK2*, CTNNA1, DICER1, FANCC, FH, FLCN, GALNT12, KIF1B, LZTR1, MAX, MEN1, MET, MLH1*, MSH2*, MSH3, MSH6*, MUTYH*, NBN, NF1*, NF2, NTHL1, PALB2*, PHOX2B, PMS2*, POT1, PRKAR1A, PTCH1, PTEN*, RAD51C*, RAD51D*, RB1, RECQL, RET, SDHA, SDHAF2, SDHB, SDHC, SDHD, SMAD4, SMARCA4, SMARCB1, SMARCE1, STK11, SUFU, TMEM127, TP53*, TSC1, TSC2, VHL and XRCC2 (sequencing and deletion/duplication); EGFR, EGLN1, HOXB13, KIT, MITF, PDGFRA, POLD1, and POLE (sequencing only); EPCAM and GREM1 (deletion/duplication only). DNA and RNA analyses performed for * genes.    Lobular carcinoma in situ (LCIS) of right breast  01/20/2020 Mammogram   Diagnostic Mammogram 01/20/20 IMPRESSION: 1.  Stable post lumpectomy changes of the left breast.   2. Suspicious microcalcifications over the right upper outer quadrant spanning 3.6 cm.   02/02/2020 Initial Biopsy   Diagnosis 02/02/20 Breast, right, needle core biopsy, upper outer quadrant, x clip - LOBULAR CARCINOMA IN SITU WITH PLEOMORPHIC FEATURES AND CALCIFICATIONS, INVOLVING ADENOSIS. SEE NOTE Diagnosis Note Immunohistochemical stain for E-cadherin is negative in the lesional cells, consistent with a lobular phenotype. Immunostains for p63, SMM 1 and calponin do not show evidence of invasive carcinoma.    02/04/2020 Initial Diagnosis   Lobular carcinoma in situ (LCIS) of right breast   03/18/2020 Surgery   RIGHT BREAST LUMPECTOMY WITH RADIOACTIVE SEED LOCALIZATION by Dr Alvira Monday    03/18/2020  Pathology Results   FINAL MICROSCOPIC DIAGNOSIS:   A. BREAST, RIGHT, LUMPECTOMY:  - Pleomorphic lobular carcinoma in situ with calcifications and  underlying complex sclerosing lesion, adenosis and fibroadenomatoid  change.  - Margins of resection are not involved (Closest margins: < 1 mm,  anterior, posterior, inferior and medial).  - Biopsy site.    COMMENT:   P63, Calponin and SMM-1 demonstrate the presence of myoepithelium in the  select focus.    Metastatic breast cancer to bone and brain  05/04/2022 Imaging    IMPRESSION: Cervical spondylosis, as described.   Nonspecific straightening of the expected cervical lordosis.   07/24/2022 Imaging    IMPRESSION: 1. Extensive osseous metastatic disease with pathologic fracture at the base of dens and C2 right lateral mass. Extraosseous tumor at C1 and C2 likely impinging on the right C2 and C3 nerve roots. 2. Degenerative cord impingement at C4-5 to C6-7. Biforaminal impingement at C5-6 and C6-7.   08/02/2022 Initial Diagnosis   Cancer, metastatic to bone ([name])   08/03/2022 PET scan    IMPRESSION: 1. Large volume osseous metastasis. 2. Low right cervical and probable right axillary nodal metastasis. 3. Subtle heterogeneous activity throughout the liver with suggestion of small liver lesions (likely new compared to chest CT of 07/16/2020). Findings are overall moderately suspicious for hepatic metastasis. Pre and post contrast abdominal MRI (preferred) or CT could confirm. 4. Right-sided pleural thickening and trace pleural fluid. Right base airspace disease is favored to represent chronic atelectasis. 5. Aortic atherosclerosis (ICD10-I70.0) and emphysema (ICD10-J43.9).  10/26/2022 -  Chemotherapy   Patient is on Treatment Plan : BREAST Paclitaxel D1,8,15 + Trastuzumab D1 + Pertuzumab D1 q21d x 8 cycles / Trastuzumab D1 + Pertuzumab D1 q21d x 4 cycles        INTERVAL HISTORY:  Martha [name] is here for a  follow up of  left breast cancer and Right   . She was last seen by me on 12/28/2022. She presents to the clinic accompanied by husband.Pt state that her dentist appointment went well no problems.Pt  state that she goes out side for fresh air and exercise. Pt state that her feeling in her hand are getting better. Pt report that she still has diarrhea and dry mouth.      All other systems were reviewed with the patient and are negative.  MEDICAL HISTORY:  Past Medical History:  Diagnosis Date   Anxiety    Cancer ([name]) 2022   right breast LCIS   Cancer ([name]) 2019   left breast   Depression    Dysrhythmia    SVT, s/p ablation ~ 2012 in at [facility]   Ectopic pregnancy    Family history of breast cancer    Family history of melanoma    History of radiation therapy 04/22/18-06/03/18   Left Breast, left SCV, axilla 50 Gy in 25 fractions, Left breast boost 10 Gy in 5 fractions.    Hypothyroidism    Personal history of radiation therapy    PONV (postoperative nausea and vomiting)    Thyroid disease     SURGICAL HISTORY: Past Surgical History:  Procedure Laterality Date   APPENDECTOMY     BILATERAL SALPINGECTOMY     BREAST BIOPSY Right 2014   fibroadenoma   BREAST BIOPSY Left 01/16/2018   BREAST BIOPSY Right 02/02/2020   LCIS   BREAST BIOPSY Right 08/04/2020   x2 LCIS   BREAST BIOPSY Right 08/13/2020   x2   BREAST EXCISIONAL BIOPSY Left 1990   benign   BREAST EXCISIONAL BIOPSY Right 02/02/2020   LCIS   BREAST LUMPECTOMY Left 01/22/2018   BREAST LUMPECTOMY WITH AXILLARY LYMPH NODE BIOPSY Left 02/21/2018   Procedure: LEFT BREAST LUMPECTOMY WITH AXILLARY LYMPH NODE BIOPSY;  Surgeon: Almond Lint, MD;  Location: [facility] OR;  Service: General;  Laterality: Left;   BREAST LUMPECTOMY WITH RADIOACTIVE SEED LOCALIZATION Right 03/18/2020   Procedure: RIGHT BREAST LUMPECTOMY WITH RADIOACTIVE SEED LOCALIZATION;  Surgeon: Almond Lint, MD;  Location: [facility] OR;  Service: General;   Laterality: Right;   BREAST SURGERY Left 1993   cyst removed   ENDOMETRIAL ABLATION     EYE SURGERY     GLAUCOMA SURGERY Bilateral    IR IMAGING GUIDED PORT INSERTION  10/16/2022   POSTERIOR CERVICAL FUSION/FORAMINOTOMY N/A 08/17/2022   Procedure: Cervical One-Cervical Four POSTERIOR CERVICAL FUSION,  Cervical One LAMINECTOMY REDUCTION OF Cervical Two Fracture, Biopsy of Right Cerical Two Pars  Lesion;  Surgeon: Bedelia Person, MD;  Location: [facility] OR;  Service: Neurosurgery;  Laterality: N/A;   POSTERIOR CERVICAL FUSION/FORAMINOTOMY N/A 09/06/2022   Procedure: Posterior Cervical Fusion, Foraminotomy , Cervical Five-Six, Cervical Six-Seven; Extension of  fusion Cervical Four- Thoracic One;  Surgeon: Bedelia Person, MD;  Location[facility];  Service: Neurosurgery;  Laterality: N/A;   RADIOACTIVE SEED GUIDED EXCISIONAL BREAST BIOPSY Right 04/28/2021   Procedure: RADIOACTIVE SEED GUIDED EXCISIONAL RIGHT BREAST BIOPSY X2;  Surgeon: Almond Lint, MD;  Location: [facility];  Service: General;  Laterality: Right;   RE-EXCISION  OF BREAST LUMPECTOMY Left 03/20/2018   Procedure: RE-EXCISION OF BREAST LUMPECTOMY;  Surgeon: Almond Lint, MD;  Location: [facility];  Service: General;  Laterality: Left;    I have reviewed the social history and family history with the patient and they are unchanged from previous note.  ALLERGIES:  is allergic to morphine and codeine.  MEDICATIONS:  Current Outpatient Medications  Medication Sig Dispense Refill   acetaminophen (TYLENOL) 500 MG tablet Take 2 tablets (1,000 mg total) by mouth every 8 (eight) hours. 30 tablet 0   citalopram (CELEXA) 40 MG tablet Take 1 tablet (40 mg total) by mouth daily. 30 tablet 3   levothyroxine (SYNTHROID) 75 MCG tablet Take 1 tablet (75 mcg total) by mouth daily before breakfast. 30 tablet 0   lidocaine (XYLOCAINE) 2 % solution Patient: Mix 1part 2% viscous lidocaine, 1part H20. Swallow 10mL of diluted  mixture, before meals and at bedtime, up to QID 100 mL 3   lidocaine-prilocaine (EMLA) cream Apply to affected area once (Patient taking differently: Apply 1 Application topically daily as needed (burning).) 30 g 3   LORazepam (ATIVAN) 0.5 MG tablet Take 1 tablet (0.5 mg total) by mouth every 6 (six) hours as needed for anxiety (agitation). 30 tablet 0   Mouthwashes (MOUTH RINSE) LIQD solution 15 mLs by Mouth Rinse route as needed (for oral care).  0   ondansetron (ZOFRAN) 8 MG tablet Take 1 tablet (8 mg total) by mouth every 8 (eight) hours as needed for nausea or vomiting. 20 tablet 1   OVER THE COUNTER MEDICATION Take 1 tablet by mouth daily. Protandim Supplement     oxyCODONE (OXY IR/ROXICODONE) 5 MG immediate release tablet Take 0.5-1 tablets (2.5-5 mg total) by mouth every 4 (four) hours as needed for moderate pain or severe pain (dyspnea). 30 tablet 0   pregabalin (LYRICA) 25 MG capsule Take 1 capsule (25 mg total) by mouth 2 (two) times daily. 60 capsule 2   senna-docusate (SENOKOT-S) 8.6-50 MG tablet Take 1 tablet by mouth 2 (two) times daily. 60 tablet 0   No current facility-administered medications for this visit.   Facility-Administered Medications Ordered in Other Visits  Medication Dose Route Frequency Provider Last Rate Last Admin   heparin lock flush 100 unit/mL  500 Units Intracatheter Once PRN Malachy Mood, MD       PACLitaxel (TAXOL) 102 mg in sodium chloride 0.9 % 250 mL chemo infusion (</= 80mg /m2)  80 mg/m2 (Order-Specific) Intravenous Once Malachy Mood, MD 267 mL/hr at 01/11/23 1225 102 mg at 01/11/23 1225   sodium chloride flush (NS) 0.9 % injection 10 mL  10 mL Intracatheter PRN Malachy Mood, MD        PHYSICAL EXAMINATION: ECOG PERFORMANCE STATUS: 3 - Symptomatic, >50% confined to bed  Vitals:   01/11/23 1011  BP: 103/74  Pulse: 97  Resp: 18  Temp: 97.8 F (36.6 C)  SpO2: 98%   Wt Readings from Last 3 Encounters:  01/11/23 75 lb 14.4 oz (34.4 kg)  01/04/23 76  lb (34.5 kg)  12/28/22 75 lb 11.2 oz (34.3 kg)     GENERAL:alert, no distress and comfortable SKIN: skin color, texture, turgor are normal, no rashes or significant lesions EYES: normal, Conjunctiva are pink and non-injected, sclera clear NECK: supple, thyroid normal size, non-tender, without nodularity LYMPH:  no palpable lymphadenopathy in the cervical, axillary  LUNGS: clear to auscultation and percussion with normal breathing effort HEART: regular rate & rhythm and no murmurs and no  lower extremity edema ABDOMEN:abdomen soft, non-tender and normal bowel sounds Musculoskeletal:no cyanosis of digits and no clubbing  NEURO: alert & oriented x 3 with fluent speech, no focal motor/sensory deficits  LABORATORY DATA:  I have reviewed the data as listed    Latest Ref Rng & Units 01/11/2023    9:53 AM 01/04/2023    8:31 AM 12/28/2022    7:53 AM  CBC  WBC 4.0 - 10.5 K/uL 4.2  2.6  3.2   Hemoglobin 12.0 - 15.0 g/dL 91.4  78.2  95.6   Hematocrit 36.0 - 46.0 % 34.8  35.1  34.8   Platelets 150 - 400 K/uL 286  263  263         Latest Ref Rng & Units 01/11/2023    9:53 AM 01/04/2023    8:31 AM 12/28/2022    7:53 AM  CMP  Glucose 70 - 99 mg/dL 213  086  578   BUN 8 - 23 mg/dL 16  8  11    Creatinine 0.44 - 1.00 mg/dL <4.69  <6.29  <5.28   Sodium 135 - 145 mmol/L 136  139  138   Potassium 3.5 - 5.1 mmol/L 3.9  3.4  3.4   Chloride 98 - 111 mmol/L 102  102  102   CO2 22 - 32 mmol/L 30  32  31   Calcium 8.9 - 10.3 mg/dL 8.8  8.7  8.7   Total Protein 6.5 - 8.1 g/dL 5.9  5.8  5.8   Total Bilirubin 0.3 - 1.2 mg/dL 0.5  0.5  0.6   Alkaline Phos 38 - 126 U/L 128  134  140   AST 15 - 41 U/L 38  25  25   ALT 0 - 44 U/L 40  26  25       RADIOGRAPHIC STUDIES: I have personally reviewed the radiological images as listed and agreed with the findings in the report. No results found.    No orders of the defined types were placed in this encounter.  All questions were answered. The patient knows to  call the clinic with any problems, questions or concerns. No barriers to learning was detected. The total time spent in the appointment was 30 minutes.     Malachy Mood, MD 01/11/2023   Carolin Coy, CMA, am acting as scribe for Malachy Mood, MD.   I have reviewed the above documentation for accuracy and completeness, and I agree with the above.

## 2023-01-11 NOTE — Patient Instructions (Signed)
[facility]  Discharge Instructions: Thank you for choosing [facility] to provide your oncology and hematology care.   If you have a lab appointment with the Cancer Center, please go directly to the Cancer Center and check in at the registration area.   Wear comfortable clothing and clothing appropriate for easy access to any Portacath or PICC line.   We strive to give you quality time with your provider. You may need to reschedule your appointment if you arrive late (15 or more minutes).  Arriving late affects you and other patients whose appointments are after yours.  Also, if you miss three or more appointments without notifying the office, you may be dismissed from the clinic at the provider's discretion.      For prescription refill requests, have your pharmacy contact our office and allow 72 hours for refills to be completed.    Today you received the following chemotherapy and/or immunotherapy agents Paclitaxel      To help prevent nausea and vomiting after your treatment, we encourage you to take your nausea medication as directed.  BELOW ARE SYMPTOMS THAT SHOULD BE REPORTED IMMEDIATELY: *FEVER GREATER THAN 100.4 F (38 C) OR HIGHER *CHILLS OR SWEATING *NAUSEA AND VOMITING THAT IS NOT CONTROLLED WITH YOUR NAUSEA MEDICATION *UNUSUAL SHORTNESS OF BREATH *UNUSUAL BRUISING OR BLEEDING *URINARY PROBLEMS (pain or burning when urinating, or frequent urination) *BOWEL PROBLEMS (unusual diarrhea, constipation, pain near the anus) TENDERNESS IN MOUTH AND THROAT WITH OR WITHOUT PRESENCE OF ULCERS (sore throat, sores in mouth, or a toothache) UNUSUAL RASH, SWELLING OR PAIN  UNUSUAL VAGINAL DISCHARGE OR ITCHING   Items with * indicate a potential emergency and should be followed up as soon as possible or go to the Emergency Department if any problems should occur.  Please show the CHEMOTHERAPY ALERT CARD or IMMUNOTHERAPY ALERT CARD at  check-in to the Emergency Department and triage nurse.  Should you have questions after your visit or need to cancel or reschedule your appointment, please contact [facility]  Dept: [phone]  and follow the prompts.  Office hours are 8:00 a.m. to 4:30 p.m. Monday - Friday. Please note that voicemails left after 4:00 p.m. may not be returned until the following business day.  We are closed weekends and major holidays. You have access to a nurse at all times for urgent questions. Please call the main number to the clinic Dept: [phone] and follow the prompts.   For any non-urgent questions, you may also contact your provider using MyChart. We now offer e-Visits for anyone 18 and older to request care online for non-urgent symptoms. For details visit [email or web address].   Also download the MyChart app! Go to the app store, search "MyChart", open the app, select [facility], and log in with your MyChart username and password.   

## 2023-01-11 NOTE — Progress Notes (Signed)
Nutrition follow-up completed with patient during infusion for recurrent breast cancer.  Weight documented as 75 pounds 14.4 ounces May 16 which is stable.  Noted labs: Glucose 112 and creatinine 0.30.  Patient reports she feels well.  Her appetite and intake continue to be good.  She denies nausea, vomiting, and constipation.  Reports she had 1 episode of diarrhea the first day after treatment.  States she drinks 2 oral nutrition supplements a day.  She uses Premier protein or Ensure Plus.  She has no questions or concerns.  Nutrition diagnosis: Inadequate oral intake improving.  Intervention: Educated patient to continue oral supplements twice a day.  Educated on the difference between [employer] protein and [employer]  Encouraged her to consume more Ensure Plus to provide additional calories.  Offered coupons however patient declined.  Monitoring, evaluation, goals: Patient will tolerate adequate calories and protein to minimize weight loss.  Next visit: To be scheduled as needed.  **Disclaimer: This note was dictated with voice recognition software. Similar sounding words can inadvertently be transcribed and this note may contain transcription errors which may not have been corrected upon publication of note.**

## 2023-01-11 NOTE — Assessment & Plan Note (Signed)
--  She was diagnosed in 12/2017. She is s/p left breast lumpectomy and adjuvant radiation.  -She started anti-estrogen therapy with letrozole on 05/2018. Tolerating well with no issues -lost f/u after visit in 04/2020 until her recurrence in 07/2022  

## 2023-01-11 NOTE — Assessment & Plan Note (Signed)
-  Diagnosed in 06/2022 --PET scan from 08/03/2022 showed diffuse bone mets  -she underwent cervical laminectomy and fusion by Dr. Maisie Fus on 08/17/2022, biopsy confirmed metastatic breast cancer, ER/PR negative, HER2 positive. -due to cervical cord compression with myelopathy, she underwent a second cervical spine surgery on September 06, 2022, and completed inpatient rehabitation. -She has completed brain, cervical and lumbar spine radiation in Feb 2024 -She was recently hospitalized for altered mental status and confusion, probably secondary to medication -She started first line systemic chemotherapy with weekly Taxol and trastuzumab/Perjeta on 10/25/22. She has tolerated well so far and her pain has improved overall -If her PET scan shows good response, we may change her Taxol to 2 weeks on and 1 week off

## 2023-01-17 ENCOUNTER — Ambulatory Visit: Payer: BC Managed Care – PPO

## 2023-01-17 MED FILL — Dexamethasone Sodium Phosphate Inj 100 MG/10ML: INTRAMUSCULAR | Qty: 1 | Status: AC

## 2023-01-18 ENCOUNTER — Inpatient Hospital Stay: Payer: BC Managed Care – PPO

## 2023-01-18 ENCOUNTER — Inpatient Hospital Stay: Payer: BC Managed Care – PPO | Admitting: Hematology

## 2023-01-18 VITALS — BP 122/77 | HR 78 | Temp 98.2°F | Resp 18 | Wt 77.1 lb

## 2023-01-18 DIAGNOSIS — C7931 Secondary malignant neoplasm of brain: Secondary | ICD-10-CM | POA: Diagnosis not present

## 2023-01-18 DIAGNOSIS — C7951 Secondary malignant neoplasm of bone: Secondary | ICD-10-CM

## 2023-01-18 DIAGNOSIS — Z170 Estrogen receptor positive status [ER+]: Secondary | ICD-10-CM | POA: Diagnosis not present

## 2023-01-18 DIAGNOSIS — Z95828 Presence of other vascular implants and grafts: Secondary | ICD-10-CM

## 2023-01-18 DIAGNOSIS — Z923 Personal history of irradiation: Secondary | ICD-10-CM | POA: Diagnosis not present

## 2023-01-18 DIAGNOSIS — G893 Neoplasm related pain (acute) (chronic): Secondary | ICD-10-CM | POA: Diagnosis not present

## 2023-01-18 DIAGNOSIS — Z79899 Other long term (current) drug therapy: Secondary | ICD-10-CM | POA: Diagnosis not present

## 2023-01-18 DIAGNOSIS — C50312 Malignant neoplasm of lower-inner quadrant of left female breast: Secondary | ICD-10-CM | POA: Diagnosis not present

## 2023-01-18 DIAGNOSIS — Z5112 Encounter for antineoplastic immunotherapy: Secondary | ICD-10-CM | POA: Diagnosis not present

## 2023-01-18 LAB — CMP (CANCER CENTER ONLY)
ALT: 56 U/L — ABNORMAL HIGH (ref 0–44)
AST: 41 U/L (ref 15–41)
Albumin: 4 g/dL (ref 3.5–5.0)
Alkaline Phosphatase: 127 U/L — ABNORMAL HIGH (ref 38–126)
Anion gap: 4 — ABNORMAL LOW (ref 5–15)
BUN: 16 mg/dL (ref 8–23)
CO2: 32 mmol/L (ref 22–32)
Calcium: 8.7 mg/dL — ABNORMAL LOW (ref 8.9–10.3)
Chloride: 102 mmol/L (ref 98–111)
Creatinine: <0.3 mg/dL — ABNORMAL LOW (ref 0.44–1.00)
Glucose, Bld: 112 mg/dL — ABNORMAL HIGH (ref 70–99)
Potassium: 3.6 mmol/L (ref 3.5–5.1)
Sodium: 138 mmol/L (ref 135–145)
Total Bilirubin: 0.6 mg/dL (ref 0.3–1.2)
Total Protein: 5.7 g/dL — ABNORMAL LOW (ref 6.5–8.1)

## 2023-01-18 LAB — CBC WITH DIFFERENTIAL (CANCER CENTER ONLY)
Abs Immature Granulocytes: 0.01 10*3/uL (ref 0.00–0.07)
Basophils Absolute: 0 10*3/uL (ref 0.0–0.1)
Basophils Relative: 1 %
Eosinophils Absolute: 0 10*3/uL (ref 0.0–0.5)
Eosinophils Relative: 1 %
HCT: 33.6 % — ABNORMAL LOW (ref 36.0–46.0)
Hemoglobin: 11.8 g/dL — ABNORMAL LOW (ref 12.0–15.0)
Immature Granulocytes: 0 %
Lymphocytes Relative: 24 %
Lymphs Abs: 0.7 10*3/uL (ref 0.7–4.0)
MCH: 35.3 pg — ABNORMAL HIGH (ref 26.0–34.0)
MCHC: 35.1 g/dL (ref 30.0–36.0)
MCV: 100.6 fL — ABNORMAL HIGH (ref 80.0–100.0)
Monocytes Absolute: 0.2 10*3/uL (ref 0.1–1.0)
Monocytes Relative: 8 %
Neutro Abs: 1.8 10*3/uL (ref 1.7–7.7)
Neutrophils Relative %: 66 %
Platelet Count: 252 10*3/uL (ref 150–400)
RBC: 3.34 MIL/uL — ABNORMAL LOW (ref 3.87–5.11)
RDW: 15 % (ref 11.5–15.5)
WBC Count: 2.7 10*3/uL — ABNORMAL LOW (ref 4.0–10.5)
nRBC: 0 % (ref 0.0–0.2)

## 2023-01-18 MED ORDER — DIPHENHYDRAMINE HCL 50 MG/ML IJ SOLN
25.0000 mg | Freq: Once | INTRAMUSCULAR | Status: AC
  Administered 2023-01-18: 25 mg via INTRAVENOUS
  Filled 2023-01-18: qty 1

## 2023-01-18 MED ORDER — SODIUM CHLORIDE 0.9 % IV SOLN
420.0000 mg | Freq: Once | INTRAVENOUS | Status: AC
  Administered 2023-01-18: 420 mg via INTRAVENOUS
  Filled 2023-01-18: qty 14

## 2023-01-18 MED ORDER — SODIUM CHLORIDE 0.9 % IV SOLN
10.0000 mg | Freq: Once | INTRAVENOUS | Status: AC
  Administered 2023-01-18: 10 mg via INTRAVENOUS
  Filled 2023-01-18: qty 10

## 2023-01-18 MED ORDER — TRASTUZUMAB-ANNS CHEMO 150 MG IV SOLR
6.0000 mg/kg | Freq: Once | INTRAVENOUS | Status: AC
  Administered 2023-01-18: 210 mg via INTRAVENOUS
  Filled 2023-01-18: qty 10

## 2023-01-18 MED ORDER — SODIUM CHLORIDE 0.9 % IV SOLN: Freq: Once | INTRAVENOUS | Status: AC

## 2023-01-18 MED ORDER — SODIUM CHLORIDE 0.9 % IV SOLN
80.0000 mg/m2 | Freq: Once | INTRAVENOUS | Status: AC
  Administered 2023-01-18: 102 mg via INTRAVENOUS
  Filled 2023-01-18: qty 17

## 2023-01-18 MED ORDER — ACETAMINOPHEN 325 MG PO TABS
650.0000 mg | ORAL_TABLET | Freq: Once | ORAL | Status: AC
  Administered 2023-01-18: 650 mg via ORAL
  Filled 2023-01-18: qty 2

## 2023-01-18 MED ORDER — SODIUM CHLORIDE 0.9% FLUSH
10.0000 mL | Freq: Once | INTRAVENOUS | Status: AC
  Administered 2023-01-18: 10 mL

## 2023-01-18 MED ORDER — FAMOTIDINE 20 MG IN NS 100 ML IVPB
20.0000 mg | Freq: Once | INTRAVENOUS | Status: AC
  Administered 2023-01-18: 20 mg via INTRAVENOUS
  Filled 2023-01-18: qty 100

## 2023-01-18 NOTE — Patient Instructions (Signed)
[facility]  Discharge Instructions: Thank you for choosing [facility] to provide your oncology and hematology care.   If you have a lab appointment with the Cancer Center, please go directly to the Cancer Center and check in at the registration area.   Wear comfortable clothing and clothing appropriate for easy access to any Portacath or PICC line.   We strive to give you quality time with your provider. You may need to reschedule your appointment if you arrive late (15 or more minutes).  Arriving late affects you and other patients whose appointments are after yours.  Also, if you miss three or more appointments without notifying the office, you may be dismissed from the clinic at the provider's discretion.      For prescription refill requests, have your pharmacy contact our office and allow 72 hours for refills to be completed.    Today you received the following chemotherapy and/or immunotherapy agents: Trastuzumab, Pertuzumab, and Paclitaxel       To help prevent nausea and vomiting after your treatment, we encourage you to take your nausea medication as directed.  BELOW ARE SYMPTOMS THAT SHOULD BE REPORTED IMMEDIATELY: *FEVER GREATER THAN 100.4 F (38 C) OR HIGHER *CHILLS OR SWEATING *NAUSEA AND VOMITING THAT IS NOT CONTROLLED WITH YOUR NAUSEA MEDICATION *UNUSUAL SHORTNESS OF BREATH *UNUSUAL BRUISING OR BLEEDING *URINARY PROBLEMS (pain or burning when urinating, or frequent urination) *BOWEL PROBLEMS (unusual diarrhea, constipation, pain near the anus) TENDERNESS IN MOUTH AND THROAT WITH OR WITHOUT PRESENCE OF ULCERS (sore throat, sores in mouth, or a toothache) UNUSUAL RASH, SWELLING OR PAIN  UNUSUAL VAGINAL DISCHARGE OR ITCHING   Items with * indicate a potential emergency and should be followed up as soon as possible or go to the Emergency Department if any problems should occur.  Please show the CHEMOTHERAPY ALERT CARD or  IMMUNOTHERAPY ALERT CARD at check-in to the Emergency Department and triage nurse.  Should you have questions after your visit or need to cancel or reschedule your appointment, please contact [facility]  Dept: [phone]  and follow the prompts.  Office hours are 8:00 a.m. to 4:30 p.m. Monday - Friday. Please note that voicemails left after 4:00 p.m. may not be returned until the following business day.  We are closed weekends and major holidays. You have access to a nurse at all times for urgent questions. Please call the main number to the clinic Dept: [phone] and follow the prompts.   For any non-urgent questions, you may also contact your provider using MyChart. We now offer e-Visits for anyone 18 and older to request care online for non-urgent symptoms. For details visit [email or web address].   Also download the MyChart app! Go to the app store, search "MyChart", open the app, select [facility], and log in with your MyChart username and password.   

## 2023-01-24 MED FILL — Dexamethasone Sodium Phosphate Inj 100 MG/10ML: INTRAMUSCULAR | Qty: 1 | Status: AC

## 2023-01-25 ENCOUNTER — Inpatient Hospital Stay: Payer: BC Managed Care – PPO

## 2023-01-25 ENCOUNTER — Inpatient Hospital Stay (HOSPITAL_BASED_OUTPATIENT_CLINIC_OR_DEPARTMENT_OTHER): Payer: BC Managed Care – PPO | Admitting: Hematology

## 2023-01-25 ENCOUNTER — Encounter: Payer: Self-pay | Admitting: Hematology

## 2023-01-25 VITALS — BP 110/74 | HR 82 | Temp 97.5°F | Resp 18 | Ht 63.0 in | Wt 76.6 lb

## 2023-01-25 DIAGNOSIS — Z170 Estrogen receptor positive status [ER+]: Secondary | ICD-10-CM

## 2023-01-25 DIAGNOSIS — Z79899 Other long term (current) drug therapy: Secondary | ICD-10-CM | POA: Diagnosis not present

## 2023-01-25 DIAGNOSIS — C7951 Secondary malignant neoplasm of bone: Secondary | ICD-10-CM

## 2023-01-25 DIAGNOSIS — G893 Neoplasm related pain (acute) (chronic): Secondary | ICD-10-CM

## 2023-01-25 DIAGNOSIS — Z923 Personal history of irradiation: Secondary | ICD-10-CM | POA: Diagnosis not present

## 2023-01-25 DIAGNOSIS — Z5112 Encounter for antineoplastic immunotherapy: Secondary | ICD-10-CM | POA: Diagnosis not present

## 2023-01-25 DIAGNOSIS — C50312 Malignant neoplasm of lower-inner quadrant of left female breast: Secondary | ICD-10-CM

## 2023-01-25 DIAGNOSIS — Z95828 Presence of other vascular implants and grafts: Secondary | ICD-10-CM

## 2023-01-25 DIAGNOSIS — C7931 Secondary malignant neoplasm of brain: Secondary | ICD-10-CM | POA: Diagnosis not present

## 2023-01-25 LAB — CBC WITH DIFFERENTIAL (CANCER CENTER ONLY)
Abs Immature Granulocytes: 0.02 10*3/uL (ref 0.00–0.07)
Basophils Absolute: 0 10*3/uL (ref 0.0–0.1)
Basophils Relative: 1 %
Eosinophils Absolute: 0 10*3/uL (ref 0.0–0.5)
Eosinophils Relative: 2 %
HCT: 35.3 % — ABNORMAL LOW (ref 36.0–46.0)
Hemoglobin: 11.9 g/dL — ABNORMAL LOW (ref 12.0–15.0)
Immature Granulocytes: 1 %
Lymphocytes Relative: 29 %
Lymphs Abs: 0.8 10*3/uL (ref 0.7–4.0)
MCH: 34 pg (ref 26.0–34.0)
MCHC: 33.7 g/dL (ref 30.0–36.0)
MCV: 100.9 fL — ABNORMAL HIGH (ref 80.0–100.0)
Monocytes Absolute: 0.2 10*3/uL (ref 0.1–1.0)
Monocytes Relative: 7 %
Neutro Abs: 1.6 10*3/uL — ABNORMAL LOW (ref 1.7–7.7)
Neutrophils Relative %: 60 %
Platelet Count: 254 10*3/uL (ref 150–400)
RBC: 3.5 MIL/uL — ABNORMAL LOW (ref 3.87–5.11)
RDW: 14.8 % (ref 11.5–15.5)
WBC Count: 2.6 10*3/uL — ABNORMAL LOW (ref 4.0–10.5)
nRBC: 0 % (ref 0.0–0.2)

## 2023-01-25 LAB — CMP (CANCER CENTER ONLY)
ALT: 59 U/L — ABNORMAL HIGH (ref 0–44)
AST: 46 U/L — ABNORMAL HIGH (ref 15–41)
Albumin: 4 g/dL (ref 3.5–5.0)
Alkaline Phosphatase: 132 U/L — ABNORMAL HIGH (ref 38–126)
Anion gap: 5 (ref 5–15)
BUN: 9 mg/dL (ref 8–23)
CO2: 30 mmol/L (ref 22–32)
Calcium: 8.6 mg/dL — ABNORMAL LOW (ref 8.9–10.3)
Chloride: 103 mmol/L (ref 98–111)
Creatinine: <0.3 mg/dL — ABNORMAL LOW (ref 0.44–1.00)
Glucose, Bld: 107 mg/dL — ABNORMAL HIGH (ref 70–99)
Potassium: 3.5 mmol/L (ref 3.5–5.1)
Sodium: 138 mmol/L (ref 135–145)
Total Bilirubin: 0.6 mg/dL (ref 0.3–1.2)
Total Protein: 6 g/dL — ABNORMAL LOW (ref 6.5–8.1)

## 2023-01-25 MED ORDER — SODIUM CHLORIDE 0.9% FLUSH
10.0000 mL | Freq: Once | INTRAVENOUS | Status: AC
  Administered 2023-01-25: 10 mL

## 2023-01-25 MED ORDER — SODIUM CHLORIDE 0.9 % IV SOLN
80.0000 mg/m2 | Freq: Once | INTRAVENOUS | Status: AC
  Administered 2023-01-25: 102 mg via INTRAVENOUS
  Filled 2023-01-25: qty 17

## 2023-01-25 MED ORDER — DIPHENHYDRAMINE HCL 50 MG/ML IJ SOLN
25.0000 mg | Freq: Once | INTRAMUSCULAR | Status: AC
  Administered 2023-01-25: 25 mg via INTRAVENOUS
  Filled 2023-01-25: qty 1

## 2023-01-25 MED ORDER — SODIUM CHLORIDE 0.9 % IV SOLN: Freq: Once | INTRAVENOUS | Status: AC

## 2023-01-25 MED ORDER — SODIUM CHLORIDE 0.9% FLUSH
10.0000 mL | INTRAVENOUS | Status: DC | PRN
  Administered 2023-01-25: 10 mL

## 2023-01-25 MED ORDER — HEPARIN SOD (PORK) LOCK FLUSH 100 UNIT/ML IV SOLN
500.0000 [IU] | Freq: Once | INTRAVENOUS | Status: AC | PRN
  Administered 2023-01-25: 500 [IU]

## 2023-01-25 MED ORDER — SODIUM CHLORIDE 0.9 % IV SOLN
10.0000 mg | Freq: Once | INTRAVENOUS | Status: AC
  Administered 2023-01-25: 10 mg via INTRAVENOUS
  Filled 2023-01-25: qty 10

## 2023-01-25 MED ORDER — FAMOTIDINE IN NACL 20-0.9 MG/50ML-% IV SOLN
20.0000 mg | Freq: Once | INTRAVENOUS | Status: AC
  Administered 2023-01-25: 20 mg via INTRAVENOUS
  Filled 2023-01-25: qty 50

## 2023-01-25 NOTE — Progress Notes (Signed)
[facility]   [phone] Fax:([phone]   Clinic Follow up Note   Patient Care Team: Ardith Dark, MD as PCP - General (Family Medicine) Almond Lint, MD as Consulting Physician (General Surgery) Malachy Mood, MD as Consulting Physician (Hematology) Lonie Peak, MD as Attending Physician (Radiation Oncology) Pollyann Samples, NP as Nurse Practitioner (Nurse Practitioner)  Date of Service:  01/25/2023  CHIEF COMPLAINT: f/u of left breast cancer and Right    CURRENT THERAPY:  Zometa q3 months/   Paclitaxel D1,8,15 + Trastuzumabd D1 + Pertuzumab D1 q21d x 8 cycles /TrastuzumabD1 +Pertuzumab D1 q21 d x4 cycles starting 10/19/2022    ASSESSMENT:  Martha [name] is a 62 y.o. female with   Metastatic breast cancer to bone and brain -Diagnosed in 06/2022 --PET scan from 08/03/2022 showed diffuse bone mets  -she underwent cervical laminectomy and fusion by Dr. Maisie Fus on 08/17/2022, biopsy confirmed metastatic breast cancer, ER/PR negative, HER2 positive. -due to cervical cord compression with myelopathy, she underwent a second cervical spine surgery on September 06, 2022, and completed inpatient rehabitation. -She has completed brain, cervical and lumbar spine radiation in Feb 2024 -She was recently hospitalized for altered mental status and confusion, probably secondary to medication -She started first line systemic chemotherapy with weekly Taxol and trastuzumab/Perjeta on 10/25/22. She has tolerated well so far and her pain has improved overall -PET scan from Jan 01, 2023 showed complete metabolic response, no hypermetabolic uptake in diffuse bone lesions, no other new lesions.  She is clinically also doing much better.  Given her excellent response to chemo, we will change her Taxol to 2 weeks on and 1 week off  Malignant neoplasm of lower-inner quadrant of left breast in female, estrogen receptor positive ([name]) -She was diagnosed in 12/2017. She is s/p  left breast lumpectomy and adjuvant radiation.  -She started anti-estrogen therapy with letrozole on 05/2018. Tolerating well with no issues -lost f/u after visit in 04/2020 until her recurrence in 07/2022     Cancer related pain She is on low-dose oxycodone, she has developed a significant pain in the right rib cage, probably related to her bone metastasis  -f/u with palliative care clinic NP Nikki        PLAN: - lab reviewed hg 11.9, WBC-2.6 -proceed with Taxol today  -Start taxol two weeks on and one week off due to good treatment response -lab/flush, f/u and treatment 02/08/2023  SUMMARY OF ONCOLOGIC HISTORY: Oncology History Overview Note  Cancer Staging Malignant neoplasm of lower-inner quadrant of left breast in female, estrogen receptor positive ([name]) Staging form: Breast, AJCC 8th Edition - Clinical stage from 01/16/2018: Stage IA (cT1c, cN0, cM0, G2, ER+, PR+, HER2-) - Signed by Malachy Mood, MD on 01/23/2018 - Pathologic: Stage IA (pT1c, pN1, cM0, G1, ER+, PR+, HER2-) - Signed by Lonie Peak, MD on 04/09/2018     Malignant neoplasm of lower-inner quadrant of left breast in female, estrogen receptor positive ([name])  01/15/2018 Mammogram   Diagnositc Mammogram 01/15/18  IMPRESSION: 1. Suspicious 1.2 x 1.4 x 1.3 cm mixed echogenicity mass left breast 7 o'clock position retroareolar location at the site of palpable concern.. 2. Indeterminate Within the left breast 7:30 o'clock retroareolar location, adjacent to the palpable mass, is a 0.5 x 0.4 x 0.5 cm oval circumscribed hypoechoic mass. 3. Indeterminate calcifications within the lateral left breast. Location of these calcifications is not definitely confirmed on the true lateral view.    01/16/2018 Initial Biopsy   Diagnosis  01/16/18 1. Breast, left, needle core biopsy, 7:30 o'clock (ribbon clip) - FIBROCYSTIC CHANGES WITH SCLEROSING ADENOSIS AND CALCIFICATIONS. - FIBROADENOMATOID CHANGE. - NO MALIGNANCY IDENTIFIED. 2.  Breast, left, needle core biopsy, 7 o'clock position (coil clip) - INVASIVE MAMMARY CARCINOMA, MSBR GRADE I/II. - SEE MICROSCOPIC DESCRIPTION Microscopic Comment  ADDENDUM: Immunohistochemistry for E-Cadherin is strongly positive in the tumor consistent with ductal carcinoma. (JDP:ah 01/17/18)   01/16/2018 Receptors her2   Estrogen Receptor: 100%, POSITIVE, STRONG STAINING INTENSITY Progesterone Receptor: 50%, POSITIVE, STRONG STAINING INTENSITY Proliferation Marker Ki67: 20% HER2 Negative   01/16/2018 Cancer Staging   Staging form: Breast, AJCC 8th Edition - Clinical stage from 01/16/2018: Stage IA (cT1c, cN0, cM0, G2, ER+, PR+, HER2-) - Signed by Malachy Mood, MD on 01/23/2018   01/22/2018 Initial Diagnosis   Malignant neoplasm of lower-inner quadrant of left breast in female, estrogen receptor positive ([name])   02/21/2018 Surgery    LEFT BREAST LUMPECTOMY WITH AXILLARY LYMPH NODE BIOPSY by Dr. Donell Beers  02/21/18   02/21/2018 Pathology Results   Diagnosis 02/21/18 1. Breast, lumpectomy, Left - INVASIVE DUCTAL CARCINOMA, GRADE I, 1.6 CM. - DUCTAL CARCINOMA IN SITU, INTERMEDIATE NUCLEAR GRADE. - ANTERIOR AND MEDIAL RESECTION MARGINS ARE POSITIVE FOR CARCINOMA. - NEGATIVE FOR LYMPHOVASCULAR OR PERINEURAL INVASION. - BACKGROUND BREAST TISSUE WITH FIBROCYSTIC CHANGE, INCLUDING SCLEROSING ADENOSIS. - BIOPSY SITE CHANGES. - SEE ONCOLOGY TABLE. 2. Lymph node, sentinel, biopsy, Left Axillary #1 - METASTATIC BREAST CARCINOMA TO A LYMPH NODE, 1.0 CM IN GREATEST DIMENSION, WITH EXTRANODAL EXTENSION (1/1). 3. Lymph node, sentinel, biopsy, Left Axillary #2 - LYMPH NODE, NEGATIVE FOR CARCINOMA (0/1).    02/21/2018 Miscellaneous   Mammaprint 02/21/18 Low Risk with 10-year risk of recurrnce at 10% -No potential signifcant chemotherapy benefit   03/20/2018 Pathology Results   RE-EXCISION OF BREAST LUMPECTOMY by Dr. Donell Beers  Diagnosis 03/20/18 1. Breast, excision, Left new anterior margin - FIBROCYSTIC  CHANGES WITH ADENOSIS AND CALCIFICATIONS. - HEALING BIOPSY SITE. - THERE IS NO EVIDENCE OF MALIGNANCY. 2. Breast, excision, Left new medial margin - FIBROCYSTIC CHANGES WITH ADENOSIS AND CALCIFICATIONS. - HEALING BIOPSY SITE. - THERE IS NO EVIDENCE OF MALIGNANCY. Microscopic Comment 1. -2. The surgical resection margin(s) of the specimen were inked and microscopically evaluated. (JBK:kh 03-22-18)   04/09/2018 Cancer Staging   Staging form: Breast, AJCC 8th Edition - Pathologic: Stage IA (pT1c, pN1, cM0, G1, ER+, PR+, HER2-) - Signed by Lonie Peak, MD on 04/09/2018   04/22/2018 - 06/03/2018 Radiation Therapy   Radaiton with Dr. Basilio Cairo 04/22/18-06/03/18   05/2018 -  Anti-estrogen oral therapy   Letrozole 2.5mg  started 05/2018    Survivorship   Per Santiago Glad, NP    05/04/2022 Imaging    IMPRESSION: Cervical spondylosis, as described.   Nonspecific straightening of the expected cervical lordosis.   07/24/2022 Imaging    IMPRESSION: 1. Extensive osseous metastatic disease with pathologic fracture at the base of dens and C2 right lateral mass. Extraosseous tumor at C1 and C2 likely impinging on the right C2 and C3 nerve roots. 2. Degenerative cord impingement at C4-5 to C6-7. Biforaminal impingement at C5-6 and C6-7.   08/03/2022 PET scan    IMPRESSION: 1. Large volume osseous metastasis. 2. Low right cervical and probable right axillary nodal metastasis. 3. Subtle heterogeneous activity throughout the liver with suggestion of small liver lesions (likely new compared to chest CT of 07/16/2020). Findings are overall moderately suspicious for hepatic metastasis. Pre and post contrast abdominal MRI (preferred) or CT could confirm. 4. Right-sided pleural thickening  and trace pleural fluid. Right base airspace disease is favored to represent chronic atelectasis. 5. Aortic atherosclerosis (ICD10-I70.0) and emphysema (ICD10-J43.9).     08/11/2022 Genetic Testing   Negative  genetic testing on the CancerNext-Expanded+RNAinsight panel.  The report date is August 11, 2022.  The CancerNext-Expanded gene panel offered by [facility] and includes sequencing and rearrangement analysis for the following 77 genes: AIP, ALK, APC*, ATM*, AXIN2, BAP1, BARD1, BLM, BMPR1A, BRCA1*, BRCA2*, BRIP1*, CDC73, CDH1*, CDK4, CDKN1B, CDKN2A, CHEK2*, CTNNA1, DICER1, FANCC, FH, FLCN, GALNT12, KIF1B, LZTR1, MAX, MEN1, MET, MLH1*, MSH2*, MSH3, MSH6*, MUTYH*, NBN, NF1*, NF2, NTHL1, PALB2*, PHOX2B, PMS2*, POT1, PRKAR1A, PTCH1, PTEN*, RAD51C*, RAD51D*, RB1, RECQL, RET, SDHA, SDHAF2, SDHB, SDHC, SDHD, SMAD4, SMARCA4, SMARCB1, SMARCE1, STK11, SUFU, TMEM127, TP53*, TSC1, TSC2, VHL and XRCC2 (sequencing and deletion/duplication); EGFR, EGLN1, HOXB13, KIT, MITF, PDGFRA, POLD1, and POLE (sequencing only); EPCAM and GREM1 (deletion/duplication only). DNA and RNA analyses performed for * genes.    Lobular carcinoma in situ (LCIS) of right breast  01/20/2020 Mammogram   Diagnostic Mammogram 01/20/20 IMPRESSION: 1.  Stable post lumpectomy changes of the left breast.   2. Suspicious microcalcifications over the right upper outer quadrant spanning 3.6 cm.   02/02/2020 Initial Biopsy   Diagnosis 02/02/20 Breast, right, needle core biopsy, upper outer quadrant, x clip - LOBULAR CARCINOMA IN SITU WITH PLEOMORPHIC FEATURES AND CALCIFICATIONS, INVOLVING ADENOSIS. SEE NOTE Diagnosis Note Immunohistochemical stain for E-cadherin is negative in the lesional cells, consistent with a lobular phenotype. Immunostains for p63, SMM 1 and calponin do not show evidence of invasive carcinoma.    02/04/2020 Initial Diagnosis   Lobular carcinoma in situ (LCIS) of right breast   03/18/2020 Surgery   RIGHT BREAST LUMPECTOMY WITH RADIOACTIVE SEED LOCALIZATION by Dr Alvira Monday    03/18/2020 Pathology Results   FINAL MICROSCOPIC DIAGNOSIS:   A. BREAST, RIGHT, LUMPECTOMY:  - Pleomorphic lobular carcinoma in situ with  calcifications and  underlying complex sclerosing lesion, adenosis and fibroadenomatoid  change.  - Margins of resection are not involved (Closest margins: < 1 mm,  anterior, posterior, inferior and medial).  - Biopsy site.    COMMENT:   P63, Calponin and SMM-1 demonstrate the presence of myoepithelium in the  select focus.    Metastatic breast cancer to bone and brain  05/04/2022 Imaging    IMPRESSION: Cervical spondylosis, as described.   Nonspecific straightening of the expected cervical lordosis.   07/24/2022 Imaging    IMPRESSION: 1. Extensive osseous metastatic disease with pathologic fracture at the base of dens and C2 right lateral mass. Extraosseous tumor at C1 and C2 likely impinging on the right C2 and C3 nerve roots. 2. Degenerative cord impingement at C4-5 to C6-7. Biforaminal impingement at C5-6 and C6-7.   08/02/2022 Initial Diagnosis   Cancer, metastatic to bone ([name])   08/03/2022 PET scan    IMPRESSION: 1. Large volume osseous metastasis. 2. Low right cervical and probable right axillary nodal metastasis. 3. Subtle heterogeneous activity throughout the liver with suggestion of small liver lesions (likely new compared to chest CT of 07/16/2020). Findings are overall moderately suspicious for hepatic metastasis. Pre and post contrast abdominal MRI (preferred) or CT could confirm. 4. Right-sided pleural thickening and trace pleural fluid. Right base airspace disease is favored to represent chronic atelectasis. 5. Aortic atherosclerosis (ICD10-I70.0) and emphysema (ICD10-J43.9).     10/26/2022 -  Chemotherapy   Patient is on Treatment Plan : BREAST Paclitaxel D1,8,15 + Trastuzumab D1 + Pertuzumab D1 q21d x 8 cycles / Trastuzumab  D1 + Pertuzumab D1 q21d x 4 cycles        INTERVAL HISTORY:  Martha [name] is here for a follow up of left breast cancer and Right . She was last seen by me on 01/11/2023. She presents to the clinic accompanied by husband.  Pt state that she is doing well. Pt  state that she is getting stronger and moving around a lot more.       All other systems were reviewed with the patient and are negative.  MEDICAL HISTORY:  Past Medical History:  Diagnosis Date   Anxiety    Cancer ([name]) 2022   right breast LCIS   Cancer ([name]) 2019   left breast   Depression    Dysrhythmia    SVT, s/p ablation ~ 2012 in at [facility]   Ectopic pregnancy    Family history of breast cancer    Family history of melanoma    History of radiation therapy 04/22/18-06/03/18   Left Breast, left SCV, axilla 50 Gy in 25 fractions, Left breast boost 10 Gy in 5 fractions.    Hypothyroidism    Personal history of radiation therapy    PONV (postoperative nausea and vomiting)    Thyroid disease     SURGICAL HISTORY: Past Surgical History:  Procedure Laterality Date   APPENDECTOMY     BILATERAL SALPINGECTOMY     BREAST BIOPSY Right 2014   fibroadenoma   BREAST BIOPSY Left 01/16/2018   BREAST BIOPSY Right 02/02/2020   LCIS   BREAST BIOPSY Right 08/04/2020   x2 LCIS   BREAST BIOPSY Right 08/13/2020   x2   BREAST EXCISIONAL BIOPSY Left 1990   benign   BREAST EXCISIONAL BIOPSY Right 02/02/2020   LCIS   BREAST LUMPECTOMY Left 01/22/2018   BREAST LUMPECTOMY WITH AXILLARY LYMPH NODE BIOPSY Left 02/21/2018   Procedure: LEFT BREAST LUMPECTOMY WITH AXILLARY LYMPH NODE BIOPSY;  Surgeon: Almond Lint, MD;  Location: [facility] OR;  Service: General;  Laterality: Left;   BREAST LUMPECTOMY WITH RADIOACTIVE SEED LOCALIZATION Right 03/18/2020   Procedure: RIGHT BREAST LUMPECTOMY WITH RADIOACTIVE SEED LOCALIZATION;  Surgeon: Almond Lint, MD;  Location: [facility] OR;  Service: General;  Laterality: Right;   BREAST SURGERY Left 1993   cyst removed   ENDOMETRIAL ABLATION     EYE SURGERY     GLAUCOMA SURGERY Bilateral    IR IMAGING GUIDED PORT INSERTION  10/16/2022   POSTERIOR CERVICAL FUSION/FORAMINOTOMY N/A 08/17/2022   Procedure: Cervical  One-Cervical Four POSTERIOR CERVICAL FUSION,  Cervical One LAMINECTOMY REDUCTION OF Cervical Two Fracture, Biopsy of Right Cerical Two Pars  Lesion;  Surgeon: Bedelia Person, MD;  Location[facility] OR;  Service: Neurosurgery;  Laterality: N/A;   POSTERIOR CERVICAL FUSION/FORAMINOTOMY N/A 09/06/2022   Procedure: Posterior Cervical Fusion, Foraminotomy , Cervical Five-Six, Cervical Six-Seven; Extension of  fusion Cervical Four- Thoracic One;  Surgeon: Bedelia Person, MD;  Location: [facility] OR;  Service: Neurosurgery;  Laterality: N/A;   RADIOACTIVE SEED GUIDED EXCISIONAL BREAST BIOPSY Right 04/28/2021   Procedure: RADIOACTIVE SEED GUIDED EXCISIONAL RIGHT BREAST BIOPSY X2;  Surgeon: Almond Lint, MD;  Location: [facility];  Service: General;  Laterality: Right;   RE-EXCISION OF BREAST LUMPECTOMY Left 03/20/2018   Procedure: RE-EXCISION OF BREAST LUMPECTOMY;  Surgeon: Almond Lint, MD;  Location: [facility];  Service: General;  Laterality: Left;    I have reviewed the social history and family history with the patient and they are unchanged from previous  note.  ALLERGIES:  is allergic to morphine and codeine.  MEDICATIONS:  Current Outpatient Medications  Medication Sig Dispense Refill   acetaminophen (TYLENOL) 500 MG tablet Take 2 tablets (1,000 mg total) by mouth every 8 (eight) hours. 30 tablet 0   citalopram (CELEXA) 40 MG tablet Take 1 tablet (40 mg total) by mouth daily. 30 tablet 3   levothyroxine (SYNTHROID) 75 MCG tablet Take 1 tablet (75 mcg total) by mouth daily before breakfast. 30 tablet 0   lidocaine (XYLOCAINE) 2 % solution Patient: Mix 1part 2% viscous lidocaine, 1part H20. Swallow 10mL of diluted mixture, before meals and at bedtime, up to QID 100 mL 3   lidocaine-prilocaine (EMLA) cream Apply to affected area once (Patient taking differently: Apply 1 Application topically daily as needed (burning).) 30 g 3   LORazepam (ATIVAN) 0.5 MG tablet Take 1  tablet (0.5 mg total) by mouth every 6 (six) hours as needed for anxiety (agitation). 30 tablet 0   Mouthwashes (MOUTH RINSE) LIQD solution 15 mLs by Mouth Rinse route as needed (for oral care).  0   ondansetron (ZOFRAN) 8 MG tablet Take 1 tablet (8 mg total) by mouth every 8 (eight) hours as needed for nausea or vomiting. 20 tablet 1   OVER THE COUNTER MEDICATION Take 1 tablet by mouth daily. Protandim Supplement     oxyCODONE (OXY IR/ROXICODONE) 5 MG immediate release tablet Take 0.5-1 tablets (2.5-5 mg total) by mouth every 4 (four) hours as needed for moderate pain or severe pain (dyspnea). 30 tablet 0   pregabalin (LYRICA) 25 MG capsule Take 1 capsule (25 mg total) by mouth 2 (two) times daily. 60 capsule 2   senna-docusate (SENOKOT-S) 8.6-50 MG tablet Take 1 tablet by mouth 2 (two) times daily. 60 tablet 0   No current facility-administered medications for this visit.   Facility-Administered Medications Ordered in Other Visits  Medication Dose Route Frequency Provider Last Rate Last Admin   dexamethasone (DECADRON) 10 mg in sodium chloride 0.9 % 50 mL IVPB  10 mg Intravenous Once Malachy Mood, MD       PACLitaxel (TAXOL) 102 mg in sodium chloride 0.9 % 250 mL chemo infusion (</= 80mg /m2)  80 mg/m2 (Order-Specific) Intravenous Once Malachy Mood, MD        PHYSICAL EXAMINATION: ECOG PERFORMANCE STATUS: 3 - Symptomatic, >50% confined to bed  Vitals:   01/25/23 0810  BP: 110/74  Pulse: 82  Resp: 18  Temp: (!) 97.5 F (36.4 C)  SpO2: 100%   Wt Readings from Last 3 Encounters:  01/25/23 76 lb 9.6 oz (34.7 kg)  01/18/23 77 lb 1 oz (35 kg)  01/11/23 75 lb 14.4 oz (34.4 kg)      GENERAL:alert, no distress and comfortable SKIN: skin color normal, no rashes or significant lesions EYES: normal, Conjunctiva are pink and non-injected, sclera clear  NEURO: alert & oriented x 3 with fluent speech  LABORATORY DATA:  I have reviewed the data as listed    Latest Ref Rng & Units 01/25/2023     7:46 AM 01/18/2023    9:32 AM 01/11/2023    9:53 AM  CBC  WBC 4.0 - 10.5 K/uL 2.6  2.7  4.2   Hemoglobin 12.0 - 15.0 g/dL 16.1  09.6  04.5   Hematocrit 36.0 - 46.0 % 35.3  33.6  34.8   Platelets 150 - 400 K/uL 254  252  286         Latest Ref Rng & Units 01/25/2023  7:46 AM 01/18/2023    9:32 AM 01/11/2023    9:53 AM  CMP  Glucose 70 - 99 mg/dL 409  811  914   BUN 8 - 23 mg/dL 9  16  16    Creatinine 0.44 - 1.00 mg/dL <7.82  <9.56  <2.13   Sodium 135 - 145 mmol/L 138  138  136   Potassium 3.5 - 5.1 mmol/L 3.5  3.6  3.9   Chloride 98 - 111 mmol/L 103  102  102   CO2 22 - 32 mmol/L 30  32  30   Calcium 8.9 - 10.3 mg/dL 8.6  8.7  8.8   Total Protein 6.5 - 8.1 g/dL 6.0  5.7  5.9   Total Bilirubin 0.3 - 1.2 mg/dL 0.6  0.6  0.5   Alkaline Phos 38 - 126 U/L 132  127  128   AST 15 - 41 U/L 46  41  38   ALT 0 - 44 U/L 59  56  40       RADIOGRAPHIC STUDIES: I have personally reviewed the radiological images as listed and agreed with the findings in the report. No results found.    No orders of the defined types were placed in this encounter.  All questions were answered. The patient knows to call the clinic with any problems, questions or concerns. No barriers to learning was detected. The total time spent in the appointment was 30 minutes.     Malachy Mood, MD 01/25/2023   Carolin Coy, CMA, am acting as scribe for Malachy Mood, MD.   I have reviewed the above documentation for accuracy and completeness, and I agree with the above.

## 2023-01-25 NOTE — Patient Instructions (Signed)
[facility]  Discharge Instructions: Thank you for choosing [facility] to provide your oncology and hematology care.   If you have a lab appointment with the Cancer Center, please go directly to the Cancer Center and check in at the registration area.   Wear comfortable clothing and clothing appropriate for easy access to any Portacath or PICC line.   We strive to give you quality time with your provider. You may need to reschedule your appointment if you arrive late (15 or more minutes).  Arriving late affects you and other patients whose appointments are after yours.  Also, if you miss three or more appointments without notifying the office, you may be dismissed from the clinic at the provider's discretion.      For prescription refill requests, have your pharmacy contact our office and allow 72 hours for refills to be completed.    Today you received the following chemotherapy and/or immunotherapy agents Paclitaxel      To help prevent nausea and vomiting after your treatment, we encourage you to take your nausea medication as directed.  BELOW ARE SYMPTOMS THAT SHOULD BE REPORTED IMMEDIATELY: *FEVER GREATER THAN 100.4 F (38 C) OR HIGHER *CHILLS OR SWEATING *NAUSEA AND VOMITING THAT IS NOT CONTROLLED WITH YOUR NAUSEA MEDICATION *UNUSUAL SHORTNESS OF BREATH *UNUSUAL BRUISING OR BLEEDING *URINARY PROBLEMS (pain or burning when urinating, or frequent urination) *BOWEL PROBLEMS (unusual diarrhea, constipation, pain near the anus) TENDERNESS IN MOUTH AND THROAT WITH OR WITHOUT PRESENCE OF ULCERS (sore throat, sores in mouth, or a toothache) UNUSUAL RASH, SWELLING OR PAIN  UNUSUAL VAGINAL DISCHARGE OR ITCHING   Items with * indicate a potential emergency and should be followed up as soon as possible or go to the Emergency Department if any problems should occur.  Please show the CHEMOTHERAPY ALERT CARD or IMMUNOTHERAPY ALERT CARD at  check-in to the Emergency Department and triage nurse.  Should you have questions after your visit or need to cancel or reschedule your appointment, please contact [facility]  Dept: [phone]  and follow the prompts.  Office hours are 8:00 a.m. to 4:30 p.m. Monday - Friday. Please note that voicemails left after 4:00 p.m. may not be returned until the following business day.  We are closed weekends and major holidays. You have access to a nurse at all times for urgent questions. Please call the main number to the clinic Dept: [phone] and follow the prompts.   For any non-urgent questions, you may also contact your provider using MyChart. We now offer e-Visits for anyone 18 and older to request care online for non-urgent symptoms. For details visit [email or web address].   Also download the MyChart app! Go to the app store, search "MyChart", open the app, select [place], and log in with your MyChart username and password.   

## 2023-01-25 NOTE — Assessment & Plan Note (Addendum)
-  Diagnosed in 06/2022 --PET scan from 08/03/2022 showed diffuse bone mets  -she underwent cervical laminectomy and fusion by Dr. Maisie Fus on 08/17/2022, biopsy confirmed metastatic breast cancer, ER/PR negative, HER2 positive. -due to cervical cord compression with myelopathy, she underwent a second cervical spine surgery on September 06, 2022, and completed inpatient rehabitation. -She has completed brain, cervical and lumbar spine radiation in Feb 2024 -She was recently hospitalized for altered mental status and confusion, probably secondary to medication -She started first line systemic chemotherapy with weekly Taxol and trastuzumab/Perjeta on 10/25/22. She has tolerated well so far and her pain has improved overall -PET scan from Jan 01, 2023 showed complete metabolic response, no hypermetabolic uptake in diffuse bone lesions, no other new lesions.  She is clinically also doing much better.  Given her excellent response to chemo, we will change her Taxol to 2 weeks on and 1 week off

## 2023-01-25 NOTE — Progress Notes (Signed)
Patient seen by Dr. America Brown are within treatment parameters.  Labs reviewed: and are not all within treatment parameters. HG  11.9, WBC -2.6 ok to treat  Per physician team, patient is ready for treatment and there are NO modifications to the treatment plan.

## 2023-01-25 NOTE — Assessment & Plan Note (Signed)
-  She is on low-dose oxycodone, she has developed a significant pain in the right rib cage, probably related to her bone metastasis  -f/u with palliative care clinic NP Nikki  

## 2023-01-25 NOTE — Assessment & Plan Note (Signed)
--  She was diagnosed in 12/2017. She is s/p left breast lumpectomy and adjuvant radiation.  -She started anti-estrogen therapy with letrozole on 05/2018. Tolerating well with no issues -lost f/u after visit in 04/2020 until her recurrence in 07/2022  

## 2023-01-30 ENCOUNTER — Other Ambulatory Visit: Payer: Self-pay | Admitting: Physical Medicine and Rehabilitation

## 2023-02-01 ENCOUNTER — Ambulatory Visit: Payer: BC Managed Care – PPO | Admitting: Hematology

## 2023-02-01 ENCOUNTER — Other Ambulatory Visit: Payer: BC Managed Care – PPO

## 2023-02-01 ENCOUNTER — Ambulatory Visit: Payer: BC Managed Care – PPO

## 2023-02-07 NOTE — Assessment & Plan Note (Signed)
-  Diagnosed in 06/2022 --PET scan from 08/03/2022 showed diffuse bone mets  -she underwent cervical laminectomy and fusion by Dr. Thomas on 08/17/2022, biopsy confirmed metastatic breast cancer, ER/PR negative, HER2 positive. -due to cervical cord compression with myelopathy, she underwent a second cervical spine surgery on September 06, 2022, and completed inpatient rehabitation. -She has completed brain, cervical and lumbar spine radiation in Feb 2024 -She was recently hospitalized for altered mental status and confusion, probably secondary to medication -She started first line systemic chemotherapy with weekly Taxol and trastuzumab/Perjeta on 10/25/22. She has tolerated well so far and her pain has improved overall -PET scan from Jan 01, 2023 showed complete metabolic response, no hypermetabolic uptake in diffuse bone lesions, no other new lesions.  She is clinically also doing much better.  Given her excellent response to chemo, we will change her Taxol to 2 weeks on and 1 week off 

## 2023-02-07 NOTE — Progress Notes (Signed)
[facility]   Telephone:([phone]) [phone] Fax:(336) [phone]   Clinic Follow up Note   Patient Care Team: Ardith Dark, MD as PCP - General (Family Medicine) Almond Lint, MD as Consulting Physician (General Surgery) Malachy Mood, MD as Consulting Physician (Hematology) Lonie Peak, MD as Attending Physician (Radiation Oncology) Pollyann Samples, NP as Nurse Practitioner (Nurse Practitioner)  Date of Service:  02/08/2023  CHIEF COMPLAINT: f/u of  left breast cancer and Right    CURRENT THERAPY:  Zometa q3 months/   Paclitaxel D1,8,15 + Trastuzumabd D1 + Pertuzumab D1 q21d x 8 cycles /TrastuzumabD1 +Pertuzumab D1 q21 d x4 cycles starting 10/19/2022   ASSESSMENT:  Martha [name] is a 62 y.o. female with   Metastatic breast cancer to bone and brain, ER-/PR-/HER2+ -Diagnosed in 06/2022 --PET scan from 08/03/2022 showed diffuse bone mets  -she underwent cervical laminectomy and fusion by Dr. Maisie Fus on 08/17/2022, biopsy confirmed metastatic breast cancer, ER/PR negative, HER2 positive. -due to cervical cord compression with myelopathy, she underwent a second cervical spine surgery on September 06, 2022, and completed inpatient rehabitation. -She has completed brain, cervical and lumbar spine radiation in Feb 2024 -She was recently hospitalized for altered mental status and confusion, probably secondary to medication -She started first line systemic chemotherapy with weekly Taxol and trastuzumab/Perjeta on 10/25/22. She has tolerated well so far and her pain has improved overall -PET scan from Jan 01, 2023 showed complete metabolic response, no hypermetabolic uptake in diffuse bone lesions, no other new lesions.  She is clinically also doing much better.  Given her excellent response to chemo, we will change her Taxol to 2 weeks on and 1 week off -She is clinically doing well, able to do little bit more activities now, she has minimal pain, as she enjoyed to chemo break last  week. -We discussed to continue Taxol for additional 3 to 6 months, then changed to maintenance trastuzumab and Perjeta -will repeat echo every 3 month, will schedule one for her in next few weeks     Malignant neoplasm of lower-inner quadrant of left breast in female, estrogen receptor positive ([name]) She was diagnosed in 12/2017. She is s/p left breast lumpectomy and adjuvant radiation.  -She started anti-estrogen therapy with letrozole on 05/2018. Tolerating well with no issues -lost f/u after visit in 04/2020 until her recurrence in 07/2022     Cancer related pain She is on low-dose oxycodone, she has developed a significant pain in the right rib cage, probably related to her bone metastasis  -f/u with palliative care clinic NP Nikki          PLAN: -lab reviewed -CMP -pending -I  refill LYRICA -proceed with taxol,perjeta, and kanjinti today -lab /flush and chemo in one week -f/u in 3 weeks before chemo    SUMMARY OF ONCOLOGIC HISTORY: Oncology History Overview Note  Cancer Staging Malignant neoplasm of lower-inner quadrant of left breast in female, estrogen receptor positive ([name]) Staging form: Breast, AJCC 8th Edition - Clinical stage from 01/16/2018: Stage IA (cT1c, cN0, cM0, G2, ER+, PR+, HER2-) - Signed by Malachy Mood, MD on 01/23/2018 - Pathologic: Stage IA (pT1c, pN1, cM0, G1, ER+, PR+, HER2-) - Signed by Lonie Peak, MD on 04/09/2018     Malignant neoplasm of lower-inner quadrant of left breast in female, estrogen receptor positive ([name])  01/15/2018 Mammogram   Diagnositc Mammogram 01/15/18  IMPRESSION: 1. Suspicious 1.2 x 1.4 x 1.3 cm mixed echogenicity mass left breast 7 o'clock position retroareolar location  at the site of palpable concern.. 2. Indeterminate Within the left breast 7:30 o'clock retroareolar location, adjacent to the palpable mass, is a 0.5 x 0.4 x 0.5 cm oval circumscribed hypoechoic mass. 3. Indeterminate calcifications within the lateral left  breast. Location of these calcifications is not definitely confirmed on the true lateral view.    01/16/2018 Initial Biopsy   Diagnosis 01/16/18 1. Breast, left, needle core biopsy, 7:30 o'clock (ribbon clip) - FIBROCYSTIC CHANGES WITH SCLEROSING ADENOSIS AND CALCIFICATIONS. - FIBROADENOMATOID CHANGE. - NO MALIGNANCY IDENTIFIED. 2. Breast, left, needle core biopsy, 7 o'clock position (coil clip) - INVASIVE MAMMARY CARCINOMA, MSBR GRADE I/II. - SEE MICROSCOPIC DESCRIPTION Microscopic Comment  ADDENDUM: Immunohistochemistry for E-Cadherin is strongly positive in the tumor consistent with ductal carcinoma. (JDP:ah 01/17/18)   01/16/2018 Receptors her2   Estrogen Receptor: 100%, POSITIVE, STRONG STAINING INTENSITY Progesterone Receptor: 50%, POSITIVE, STRONG STAINING INTENSITY Proliferation Marker Ki67: 20% HER2 Negative   01/16/2018 Cancer Staging   Staging form: Breast, AJCC 8th Edition - Clinical stage from 01/16/2018: Stage IA (cT1c, cN0, cM0, G2, ER+, PR+, HER2-) - Signed by Malachy Mood, MD on 01/23/2018   01/22/2018 Initial Diagnosis   Malignant neoplasm of lower-inner quadrant of left breast in female, estrogen receptor positive ([name])   02/21/2018 Surgery    LEFT BREAST LUMPECTOMY WITH AXILLARY LYMPH NODE BIOPSY by Dr. Donell Beers  02/21/18   02/21/2018 Pathology Results   Diagnosis 02/21/18 1. Breast, lumpectomy, Left - INVASIVE DUCTAL CARCINOMA, GRADE I, 1.6 CM. - DUCTAL CARCINOMA IN SITU, INTERMEDIATE NUCLEAR GRADE. - ANTERIOR AND MEDIAL RESECTION MARGINS ARE POSITIVE FOR CARCINOMA. - NEGATIVE FOR LYMPHOVASCULAR OR PERINEURAL INVASION. - BACKGROUND BREAST TISSUE WITH FIBROCYSTIC CHANGE, INCLUDING SCLEROSING ADENOSIS. - BIOPSY SITE CHANGES. - SEE ONCOLOGY TABLE. 2. Lymph node, sentinel, biopsy, Left Axillary #1 - METASTATIC BREAST CARCINOMA TO A LYMPH NODE, 1.0 CM IN GREATEST DIMENSION, WITH EXTRANODAL EXTENSION (1/1). 3. Lymph node, sentinel, biopsy, Left Axillary #2 - LYMPH  NODE, NEGATIVE FOR CARCINOMA (0/1).    02/21/2018 Miscellaneous   Mammaprint 02/21/18 Low Risk with 10-year risk of recurrnce at 10% -No potential signifcant chemotherapy benefit   03/20/2018 Pathology Results   RE-EXCISION OF BREAST LUMPECTOMY by Dr. Donell Beers  Diagnosis 03/20/18 1. Breast, excision, Left new anterior margin - FIBROCYSTIC CHANGES WITH ADENOSIS AND CALCIFICATIONS. - HEALING BIOPSY SITE. - THERE IS NO EVIDENCE OF MALIGNANCY. 2. Breast, excision, Left new medial margin - FIBROCYSTIC CHANGES WITH ADENOSIS AND CALCIFICATIONS. - HEALING BIOPSY SITE. - THERE IS NO EVIDENCE OF MALIGNANCY. Microscopic Comment 1. -2. The surgical resection margin(s) of the specimen were inked and microscopically evaluated. (JBK:kh 03-22-18)   04/09/2018 Cancer Staging   Staging form: Breast, AJCC 8th Edition - Pathologic: Stage IA (pT1c, pN1, cM0, G1, ER+, PR+, HER2-) - Signed by Lonie Peak, MD on 04/09/2018   04/22/2018 - 06/03/2018 Radiation Therapy   Radaiton with Dr. Basilio Cairo 04/22/18-06/03/18   05/2018 -  Anti-estrogen oral therapy   Letrozole 2.5mg  started 05/2018    Survivorship   Per Santiago Glad, NP    05/04/2022 Imaging    IMPRESSION: Cervical spondylosis, as described.   Nonspecific straightening of the expected cervical lordosis.   07/24/2022 Imaging    IMPRESSION: 1. Extensive osseous metastatic disease with pathologic fracture at the base of dens and C2 right lateral mass. Extraosseous tumor at C1 and C2 likely impinging on the right C2 and C3 nerve roots. 2. Degenerative cord impingement at C4-5 to C6-7. Biforaminal impingement at C5-6 and C6-7.   08/03/2022 PET scan  IMPRESSION: 1. Large volume osseous metastasis. 2. Low right cervical and probable right axillary nodal metastasis. 3. Subtle heterogeneous activity throughout the liver with suggestion of small liver lesions (likely new compared to chest CT of 07/16/2020). Findings are overall moderately suspicious  for hepatic metastasis. Pre and post contrast abdominal MRI (preferred) or CT could confirm. 4. Right-sided pleural thickening and trace pleural fluid. Right base airspace disease is favored to represent chronic atelectasis. 5. Aortic atherosclerosis (ICD10-I70.0) and emphysema (ICD10-J43.9).     08/11/2022 Genetic Testing   Negative genetic testing on the CancerNext-Expanded+RNAinsight panel.  The report date is August 11, 2022.  The CancerNext-Expanded gene panel offered by [facility] and includes sequencing and rearrangement analysis for the following 77 genes: AIP, ALK, APC*, ATM*, AXIN2, BAP1, BARD1, BLM, BMPR1A, BRCA1*, BRCA2*, BRIP1*, CDC73, CDH1*, CDK4, CDKN1B, CDKN2A, CHEK2*, CTNNA1, DICER1, FANCC, FH, FLCN, GALNT12, KIF1B, LZTR1, MAX, MEN1, MET, MLH1*, MSH2*, MSH3, MSH6*, MUTYH*, NBN, NF1*, NF2, NTHL1, PALB2*, PHOX2B, PMS2*, POT1, PRKAR1A, PTCH1, PTEN*, RAD51C*, RAD51D*, RB1, RECQL, RET, SDHA, SDHAF2, SDHB, SDHC, SDHD, SMAD4, SMARCA4, SMARCB1, SMARCE1, STK11, SUFU, TMEM127, TP53*, TSC1, TSC2, VHL and XRCC2 (sequencing and deletion/duplication); EGFR, EGLN1, HOXB13, KIT, MITF, PDGFRA, POLD1, and POLE (sequencing only); EPCAM and GREM1 (deletion/duplication only). DNA and RNA analyses performed for * genes.    Lobular carcinoma in situ (LCIS) of right breast  01/20/2020 Mammogram   Diagnostic Mammogram 01/20/20 IMPRESSION: 1.  Stable post lumpectomy changes of the left breast.   2. Suspicious microcalcifications over the right upper outer quadrant spanning 3.6 cm.   02/02/2020 Initial Biopsy   Diagnosis 02/02/20 Breast, right, needle core biopsy, upper outer quadrant, x clip - LOBULAR CARCINOMA IN SITU WITH PLEOMORPHIC FEATURES AND CALCIFICATIONS, INVOLVING ADENOSIS. SEE NOTE Diagnosis Note Immunohistochemical stain for E-cadherin is negative in the lesional cells, consistent with a lobular phenotype. Immunostains for p63, SMM 1 and calponin do not show evidence of invasive  carcinoma.    02/04/2020 Initial Diagnosis   Lobular carcinoma in situ (LCIS) of right breast   03/18/2020 Surgery   RIGHT BREAST LUMPECTOMY WITH RADIOACTIVE SEED LOCALIZATION by Dr Alvira Monday    03/18/2020 Pathology Results   FINAL MICROSCOPIC DIAGNOSIS:   A. BREAST, RIGHT, LUMPECTOMY:  - Pleomorphic lobular carcinoma in situ with calcifications and  underlying complex sclerosing lesion, adenosis and fibroadenomatoid  change.  - Margins of resection are not involved (Closest margins: < 1 mm,  anterior, posterior, inferior and medial).  - Biopsy site.    COMMENT:   P63, Calponin and SMM-1 demonstrate the presence of myoepithelium in the  select focus.    Metastatic breast cancer to bone and brain  05/04/2022 Imaging    IMPRESSION: Cervical spondylosis, as described.   Nonspecific straightening of the expected cervical lordosis.   07/24/2022 Imaging    IMPRESSION: 1. Extensive osseous metastatic disease with pathologic fracture at the base of dens and C2 right lateral mass. Extraosseous tumor at C1 and C2 likely impinging on the right C2 and C3 nerve roots. 2. Degenerative cord impingement at C4-5 to C6-7. Biforaminal impingement at C5-6 and C6-7.   08/02/2022 Initial Diagnosis   Cancer, metastatic to bone ([name])   08/03/2022 PET scan    IMPRESSION: 1. Large volume osseous metastasis. 2. Low right cervical and probable right axillary nodal metastasis. 3. Subtle heterogeneous activity throughout the liver with suggestion of small liver lesions (likely new compared to chest CT of 07/16/2020). Findings are overall moderately suspicious for hepatic metastasis. Pre and post contrast abdominal MRI (preferred)  or CT could confirm. 4. Right-sided pleural thickening and trace pleural fluid. Right base airspace disease is favored to represent chronic atelectasis. 5. Aortic atherosclerosis (ICD10-I70.0) and emphysema (ICD10-J43.9).     10/26/2022 -  Chemotherapy   Patient is on  Treatment Plan : BREAST Paclitaxel D1,8,15 + Trastuzumab D1 + Pertuzumab D1 q21d x 8 cycles / Trastuzumab D1 + Pertuzumab D1 q21d x 4 cycles        INTERVAL HISTORY:  Martha [name] is here for a follow up of  left breast cancer and Right . She was last seen by me on 01/25/2023. She presents to the clinic accompanied by husband. Pt felt good from the chem break she had. She was able to visit family in [place] and she stayed up for 8 hours. Pt state that she is able to stand up a little longer than usual.Pt denies having any pain. Her appetite is overall good. She denies having fever and chills. Pt does have some numbness and tingling due to her spin issues, but no change. Pt denies having diarrhea , dyspnea and cough.     All other systems were reviewed with the patient and are negative.  MEDICAL HISTORY:  Past Medical History:  Diagnosis Date   Anxiety    Cancer ([name]) 2022   right breast LCIS   Cancer ([name]) 2019   left breast   Depression    Dysrhythmia    SVT, s/p ablation ~ 2012 in at [facility]   Ectopic pregnancy    Family history of breast cancer    Family history of melanoma    History of radiation therapy 04/22/18-06/03/18   Left Breast, left SCV, axilla 50 Gy in 25 fractions, Left breast boost 10 Gy in 5 fractions.    Hypothyroidism    Personal history of radiation therapy    PONV (postoperative nausea and vomiting)    Thyroid disease     SURGICAL HISTORY: Past Surgical History:  Procedure Laterality Date   APPENDECTOMY     BILATERAL SALPINGECTOMY     BREAST BIOPSY Right 2014   fibroadenoma   BREAST BIOPSY Left 01/16/2018   BREAST BIOPSY Right 02/02/2020   LCIS   BREAST BIOPSY Right 08/04/2020   x2 LCIS   BREAST BIOPSY Right 08/13/2020   x2   BREAST EXCISIONAL BIOPSY Left 1990   benign   BREAST EXCISIONAL BIOPSY Right 02/02/2020   LCIS   BREAST LUMPECTOMY Left 01/22/2018   BREAST LUMPECTOMY WITH AXILLARY LYMPH NODE BIOPSY Left  02/21/2018   Procedure: LEFT BREAST LUMPECTOMY WITH AXILLARY LYMPH NODE BIOPSY;  Surgeon: Almond Lint, MD;  Location: [facility] OR;  Service: General;  Laterality: Left;   BREAST LUMPECTOMY WITH RADIOACTIVE SEED LOCALIZATION Right 03/18/2020   Procedure: RIGHT BREAST LUMPECTOMY WITH RADIOACTIVE SEED LOCALIZATION;  Surgeon: Almond Lint, MD;  Location: [facility] OR;  Service: General;  Laterality: Right;   BREAST SURGERY Left 1993   cyst removed   ENDOMETRIAL ABLATION     EYE SURGERY     GLAUCOMA SURGERY Bilateral    IR IMAGING GUIDED PORT INSERTION  10/16/2022   POSTERIOR CERVICAL FUSION/FORAMINOTOMY N/A 08/17/2022   Procedure: Cervical One-Cervical Four POSTERIOR CERVICAL FUSION,  Cervical One LAMINECTOMY REDUCTION OF Cervical Two Fracture, Biopsy of Right Cerical Two Pars  Lesion;  Surgeon: Bedelia Person, MD;  Location[facility];  Service: Neurosurgery;  Laterality: N/A;   POSTERIOR CERVICAL FUSION/FORAMINOTOMY N/A 09/06/2022   Procedure: Posterior Cervical Fusion, Foraminotomy , Cervical Five-Six, Cervical Six-Seven; Extension  of  fusion Cervical Four- Thoracic One;  Surgeon: Bedelia Person, MD;  Location: [facility] OR;  Service: Neurosurgery;  Laterality: N/A;   RADIOACTIVE SEED GUIDED EXCISIONAL BREAST BIOPSY Right 04/28/2021   Procedure: RADIOACTIVE SEED GUIDED EXCISIONAL RIGHT BREAST BIOPSY X2;  Surgeon: Almond Lint, MD;  Location: [facility];  Service: General;  Laterality: Right;   RE-EXCISION OF BREAST LUMPECTOMY Left 03/20/2018   Procedure: RE-EXCISION OF BREAST LUMPECTOMY;  Surgeon: Almond Lint, MD;  Location: [facility];  Service: General;  Laterality: Left;    I have reviewed the social history and family history with the patient and they are unchanged from previous note.  ALLERGIES:  is allergic to morphine and codeine.  MEDICATIONS:  Current Outpatient Medications  Medication Sig Dispense Refill   acetaminophen (TYLENOL) 500 MG tablet Take 2 tablets  (1,000 mg total) by mouth every 8 (eight) hours. 30 tablet 0   citalopram (CELEXA) 40 MG tablet Take 1 tablet (40 mg total) by mouth daily. 30 tablet 3   levothyroxine (SYNTHROID) 75 MCG tablet Take 1 tablet (75 mcg total) by mouth daily before breakfast. 30 tablet 0   lidocaine (XYLOCAINE) 2 % solution Patient: Mix 1part 2% viscous lidocaine, 1part H20. Swallow 10mL of diluted mixture, before meals and at bedtime, up to QID 100 mL 3   lidocaine-prilocaine (EMLA) cream Apply to affected area once (Patient taking differently: Apply 1 Application topically daily as needed (burning).) 30 g 3   LORazepam (ATIVAN) 0.5 MG tablet Take 1 tablet (0.5 mg total) by mouth every 6 (six) hours as needed for anxiety (agitation). 30 tablet 0   Mouthwashes (MOUTH RINSE) LIQD solution 15 mLs by Mouth Rinse route as needed (for oral care).  0   ondansetron (ZOFRAN) 8 MG tablet Take 1 tablet (8 mg total) by mouth every 8 (eight) hours as needed for nausea or vomiting. 20 tablet 1   OVER THE COUNTER MEDICATION Take 1 tablet by mouth daily. Protandim Supplement     oxyCODONE (OXY IR/ROXICODONE) 5 MG immediate release tablet Take 0.5-1 tablets (2.5-5 mg total) by mouth every 4 (four) hours as needed for moderate pain or severe pain (dyspnea). 30 tablet 0   pregabalin (LYRICA) 25 MG capsule Take 1 capsule (25 mg total) by mouth 2 (two) times daily. 60 capsule 2   senna-docusate (SENOKOT-S) 8.6-50 MG tablet Take 1 tablet by mouth 2 (two) times daily. 60 tablet 0   No current facility-administered medications for this visit.   Facility-Administered Medications Ordered in Other Visits  Medication Dose Route Frequency Provider Last Rate Last Admin   acetaminophen (TYLENOL) tablet 650 mg  650 mg Oral Once Malachy Mood, MD       dexamethasone (DECADRON) 10 mg in sodium chloride 0.9 % 50 mL IVPB  10 mg Intravenous Once Malachy Mood, MD 204 mL/hr at 02/08/23 0908 10 mg at 02/08/23 0908   diphenhydrAMINE (BENADRYL) injection 25  mg  25 mg Intravenous Once Malachy Mood, MD       famotidine (PEPCID) IVPB 20 mg premix  20 mg Intravenous Once Malachy Mood, MD       heparin lock flush 100 unit/mL  500 Units Intracatheter Once PRN Malachy Mood, MD       PACLitaxel (TAXOL) 102 mg in sodium chloride 0.9 % 250 mL chemo infusion (</= 80mg /m2)  80 mg/m2 (Order-Specific) Intravenous Once Malachy Mood, MD       pertuzumab (PERJETA) 420 mg in sodium chloride 0.9 % 250  mL chemo infusion  420 mg Intravenous Once Malachy Mood, MD       sodium chloride flush (NS) 0.9 % injection 10 mL  10 mL Intracatheter PRN Malachy Mood, MD       trastuzumab-anns [facility]) 210 mg in sodium chloride 0.9 % 250 mL chemo infusion  6 mg/kg (Treatment Plan Recorded) Intravenous Once Malachy Mood, MD        PHYSICAL EXAMINATION: ECOG PERFORMANCE STATUS: 3 - Symptomatic, >50% confined to bed  Vitals:   02/08/23 0817  BP: 116/69  Pulse: 94  Resp: 18  Temp: 97.9 F (36.6 C)  SpO2: 95%   Wt Readings from Last 3 Encounters:  02/08/23 79 lb 1 oz (35.9 kg)  01/25/23 76 lb 9.6 oz (34.7 kg)  01/18/23 77 lb 1 oz (35 kg)     GENERAL:alert, no distress and comfortable SKIN: skin color normal, no rashes or significant lesions EYES: normal, Conjunctiva are pink and non-injected, sclera clear  NEURO: alert & oriented x 3 with fluent speech LUNGS: (-)clear to auscultation and percussion with normal breathing effort HEART:(-) regular rate & rhythm and no murmurs and no lower extremity edema ABDOMEN:(-) abdomen soft, (-) non-tender and normal bowel sounds  LABORATORY DATA:  I have reviewed the data as listed    Latest Ref Rng & Units 02/08/2023    7:50 AM 01/25/2023    7:46 AM 01/18/2023    9:32 AM  CBC  WBC 4.0 - 10.5 K/uL 4.4  2.6  2.7   Hemoglobin 12.0 - 15.0 g/dL 16.1  09.6  04.5   Hematocrit 36.0 - 46.0 % 38.3  35.3  33.6   Platelets 150 - 400 K/uL 251  254  252         Latest Ref Rng & Units 02/08/2023    7:50 AM 01/25/2023    7:46 AM 01/18/2023    9:32 AM  CMP   Glucose 70 - 99 mg/dL 409  811  914   BUN 8 - 23 mg/dL 13  9  16    Creatinine 0.44 - 1.00 mg/dL <7.82  <9.56  <2.13   Sodium 135 - 145 mmol/L 139  138  138   Potassium 3.5 - 5.1 mmol/L 3.6  3.5  3.6   Chloride 98 - 111 mmol/L 104  103  102   CO2 22 - 32 mmol/L 31  30  32   Calcium 8.9 - 10.3 mg/dL 8.8  8.6  8.7   Total Protein 6.5 - 8.1 g/dL 5.9  6.0  5.7   Total Bilirubin 0.3 - 1.2 mg/dL 0.5  0.6  0.6   Alkaline Phos 38 - 126 U/L 157  132  127   AST 15 - 41 U/L 83  46  41   ALT 0 - 44 U/L 100  59  56       RADIOGRAPHIC STUDIES: I have personally reviewed the radiological images as listed and agreed with the findings in the report. No results found.    Orders Placed This Encounter  Procedures   CBC with Differential (Cancer Center Only)    Standing Status:   Future    Standing Expiration Date:   02/29/2024   CMP (Cancer Center only)    Standing Status:   Future    Standing Expiration Date:   02/29/2024   CBC with Differential (Cancer Center Only)    Standing Status:   Future    Standing Expiration Date:   03/07/2024   CMP (Cancer  Center only)    Standing Status:   Future    Standing Expiration Date:   03/07/2024   CBC with Differential (Cancer Center Only)    Standing Status:   Future    Standing Expiration Date:   03/21/2024   CMP (Cancer Center only)    Standing Status:   Future    Standing Expiration Date:   03/21/2024   CBC with Differential (Cancer Center Only)    Standing Status:   Future    Standing Expiration Date:   03/28/2024   CMP (Cancer Center only)    Standing Status:   Future    Standing Expiration Date:   03/28/2024   All questions were answered. The patient knows to call the clinic with any problems, questions or concerns. No barriers to learning was detected. The total time spent in the appointment was 25 minutes.     Malachy Mood, MD 02/08/2023   Carolin Coy, CMA, am acting as scribe for Malachy Mood, MD.   I have reviewed the above documentation for  accuracy and completeness, and I agree with the above.

## 2023-02-07 NOTE — Assessment & Plan Note (Signed)
-  She is on low-dose oxycodone, she has developed a significant pain in the right rib cage, probably related to her bone metastasis  -f/u with palliative care clinic NP Nikki  

## 2023-02-07 NOTE — Assessment & Plan Note (Signed)
--  She was diagnosed in 12/2017. She is s/p left breast lumpectomy and adjuvant radiation.  -She started anti-estrogen therapy with letrozole on 05/2018. Tolerating well with no issues -lost f/u after visit in 04/2020 until her recurrence in 07/2022  

## 2023-02-08 ENCOUNTER — Inpatient Hospital Stay: Payer: BC Managed Care – PPO | Admitting: Dietician

## 2023-02-08 ENCOUNTER — Other Ambulatory Visit: Payer: Self-pay

## 2023-02-08 ENCOUNTER — Inpatient Hospital Stay: Payer: BC Managed Care – PPO

## 2023-02-08 ENCOUNTER — Encounter: Payer: Self-pay | Admitting: Hematology

## 2023-02-08 ENCOUNTER — Inpatient Hospital Stay: Payer: BC Managed Care – PPO | Attending: Genetic Counselor | Admitting: Hematology

## 2023-02-08 VITALS — BP 116/69 | HR 94 | Temp 97.9°F | Resp 18 | Ht 63.0 in | Wt 79.1 lb

## 2023-02-08 DIAGNOSIS — Z170 Estrogen receptor positive status [ER+]: Secondary | ICD-10-CM | POA: Diagnosis not present

## 2023-02-08 DIAGNOSIS — G893 Neoplasm related pain (acute) (chronic): Secondary | ICD-10-CM | POA: Diagnosis not present

## 2023-02-08 DIAGNOSIS — C7931 Secondary malignant neoplasm of brain: Secondary | ICD-10-CM | POA: Insufficient documentation

## 2023-02-08 DIAGNOSIS — C7951 Secondary malignant neoplasm of bone: Secondary | ICD-10-CM

## 2023-02-08 DIAGNOSIS — C50312 Malignant neoplasm of lower-inner quadrant of left female breast: Secondary | ICD-10-CM | POA: Insufficient documentation

## 2023-02-08 DIAGNOSIS — M792 Neuralgia and neuritis, unspecified: Secondary | ICD-10-CM

## 2023-02-08 DIAGNOSIS — Z515 Encounter for palliative care: Secondary | ICD-10-CM

## 2023-02-08 DIAGNOSIS — Z5112 Encounter for antineoplastic immunotherapy: Secondary | ICD-10-CM | POA: Insufficient documentation

## 2023-02-08 DIAGNOSIS — Z79899 Other long term (current) drug therapy: Secondary | ICD-10-CM | POA: Diagnosis not present

## 2023-02-08 DIAGNOSIS — Z95828 Presence of other vascular implants and grafts: Secondary | ICD-10-CM

## 2023-02-08 LAB — CMP (CANCER CENTER ONLY)
ALT: 100 U/L — ABNORMAL HIGH (ref 0–44)
AST: 83 U/L — ABNORMAL HIGH (ref 15–41)
Albumin: 3.6 g/dL (ref 3.5–5.0)
Alkaline Phosphatase: 157 U/L — ABNORMAL HIGH (ref 38–126)
Anion gap: 4 — ABNORMAL LOW (ref 5–15)
BUN: 13 mg/dL (ref 8–23)
CO2: 31 mmol/L (ref 22–32)
Calcium: 8.8 mg/dL — ABNORMAL LOW (ref 8.9–10.3)
Chloride: 104 mmol/L (ref 98–111)
Creatinine: <0.3 mg/dL — ABNORMAL LOW (ref 0.44–1.00)
Glucose, Bld: 113 mg/dL — ABNORMAL HIGH (ref 70–99)
Potassium: 3.6 mmol/L (ref 3.5–5.1)
Sodium: 139 mmol/L (ref 135–145)
Total Bilirubin: 0.5 mg/dL (ref 0.3–1.2)
Total Protein: 5.9 g/dL — ABNORMAL LOW (ref 6.5–8.1)

## 2023-02-08 LAB — CBC WITH DIFFERENTIAL (CANCER CENTER ONLY)
Abs Immature Granulocytes: 0.02 10*3/uL (ref 0.00–0.07)
Basophils Absolute: 0 10*3/uL (ref 0.0–0.1)
Basophils Relative: 1 %
Eosinophils Absolute: 0 10*3/uL (ref 0.0–0.5)
Eosinophils Relative: 1 %
HCT: 38.3 % (ref 36.0–46.0)
Hemoglobin: 12.7 g/dL (ref 12.0–15.0)
Immature Granulocytes: 1 %
Lymphocytes Relative: 16 %
Lymphs Abs: 0.7 10*3/uL (ref 0.7–4.0)
MCH: 33.7 pg (ref 26.0–34.0)
MCHC: 33.2 g/dL (ref 30.0–36.0)
MCV: 101.6 fL — ABNORMAL HIGH (ref 80.0–100.0)
Monocytes Absolute: 0.5 10*3/uL (ref 0.1–1.0)
Monocytes Relative: 11 %
Neutro Abs: 3.2 10*3/uL (ref 1.7–7.7)
Neutrophils Relative %: 70 %
Platelet Count: 251 10*3/uL (ref 150–400)
RBC: 3.77 MIL/uL — ABNORMAL LOW (ref 3.87–5.11)
RDW: 14.4 % (ref 11.5–15.5)
WBC Count: 4.4 10*3/uL (ref 4.0–10.5)
nRBC: 0 % (ref 0.0–0.2)

## 2023-02-08 MED ORDER — SODIUM CHLORIDE 0.9 % IV SOLN
420.0000 mg | Freq: Once | INTRAVENOUS | Status: AC
  Administered 2023-02-08: 420 mg via INTRAVENOUS
  Filled 2023-02-08: qty 14

## 2023-02-08 MED ORDER — DIPHENHYDRAMINE HCL 50 MG/ML IJ SOLN
25.0000 mg | Freq: Once | INTRAMUSCULAR | Status: AC
  Administered 2023-02-08: 25 mg via INTRAVENOUS
  Filled 2023-02-08: qty 1

## 2023-02-08 MED ORDER — FAMOTIDINE IN NACL 20-0.9 MG/50ML-% IV SOLN
20.0000 mg | Freq: Once | INTRAVENOUS | Status: AC
  Administered 2023-02-08: 20 mg via INTRAVENOUS
  Filled 2023-02-08: qty 50

## 2023-02-08 MED ORDER — SODIUM CHLORIDE 0.9 % IV SOLN: Freq: Once | INTRAVENOUS | Status: AC

## 2023-02-08 MED ORDER — HEPARIN SOD (PORK) LOCK FLUSH 100 UNIT/ML IV SOLN
500.0000 [IU] | Freq: Once | INTRAVENOUS | Status: AC | PRN
  Administered 2023-02-08: 500 [IU]

## 2023-02-08 MED ORDER — SODIUM CHLORIDE 0.9 % IV SOLN
80.0000 mg/m2 | Freq: Once | INTRAVENOUS | Status: AC
  Administered 2023-02-08: 102 mg via INTRAVENOUS
  Filled 2023-02-08: qty 17

## 2023-02-08 MED ORDER — PREGABALIN 25 MG PO CAPS
25.0000 mg | ORAL_CAPSULE | Freq: Two times a day (BID) | ORAL | 2 refills | Status: DC
Start: 2023-02-08 — End: 2023-03-08

## 2023-02-08 MED ORDER — SODIUM CHLORIDE 0.9% FLUSH
10.0000 mL | Freq: Once | INTRAVENOUS | Status: AC
  Administered 2023-02-08: 10 mL

## 2023-02-08 MED ORDER — SODIUM CHLORIDE 0.9% FLUSH
10.0000 mL | INTRAVENOUS | Status: DC | PRN
  Administered 2023-02-08: 10 mL

## 2023-02-08 MED ORDER — ACETAMINOPHEN 325 MG PO TABS
650.0000 mg | ORAL_TABLET | Freq: Once | ORAL | Status: AC
  Administered 2023-02-08: 650 mg via ORAL
  Filled 2023-02-08: qty 2

## 2023-02-08 MED ORDER — TRASTUZUMAB-ANNS CHEMO 150 MG IV SOLR
6.0000 mg/kg | Freq: Once | INTRAVENOUS | Status: AC
  Administered 2023-02-08: 210 mg via INTRAVENOUS
  Filled 2023-02-08: qty 10

## 2023-02-08 MED ORDER — SODIUM CHLORIDE 0.9 % IV SOLN
10.0000 mg | Freq: Once | INTRAVENOUS | Status: AC
  Administered 2023-02-08: 10 mg via INTRAVENOUS
  Filled 2023-02-08: qty 10

## 2023-02-08 NOTE — Progress Notes (Signed)
Per Dr. Mosetta Putt "OK To Treat today w/Echo from 10/15/2022".  Echo's are Q23months per Dr. Mosetta Putt.

## 2023-02-08 NOTE — Progress Notes (Signed)
Per Dr Mosetta Putt, pt due for echo q. 3 months. Ok to tx today. Scheduled for echo on 6/21.

## 2023-02-08 NOTE — Progress Notes (Signed)
Nutrition Follow-up:  Patient with metastatic breast cancer. She is receiving paclitaxel q21d.   Met with patient in infusion. Husband is present at visit. Patient in good spirits and reports improved energy in the last few weeks. Her appetite has improved and eating a variety of foods. Recalls steak, loaded baked potato, salad, tacos, pizza, cereal, waffles, coconut peanut butter cookies. She is drinking 2 Ensure/Premier. Patient recently enjoyed a trip to visit with family. She had a pedicure last weekend. Husband reports that was her first outing since December.   Medications: reviewed  Labs: Cr <0.30  Anthropometrics: Wt 79 lb 1 oz today increased   5/16 - 75 lb 14.4 oz    NUTRITION DIAGNOSIS: Inadequate oral intake improving     INTERVENTION:  Continue strategies for increasing calories and protein  Encouraged high calorie high protein foods to promote weight gain Continue 2 Ensure Plus HP/equivalent     MONITORING, EVALUATION, GOAL: weight trends, intake   NEXT VISIT: To be scheduled with upcoming treatment

## 2023-02-09 ENCOUNTER — Other Ambulatory Visit: Payer: Self-pay

## 2023-02-09 ENCOUNTER — Encounter: Payer: Self-pay | Admitting: Hematology

## 2023-02-12 ENCOUNTER — Telehealth: Payer: Self-pay | Admitting: Hematology

## 2023-02-14 ENCOUNTER — Ambulatory Visit: Payer: BC Managed Care – PPO

## 2023-02-14 MED FILL — Dexamethasone Sodium Phosphate Inj 100 MG/10ML: INTRAMUSCULAR | Qty: 1 | Status: AC

## 2023-02-15 ENCOUNTER — Inpatient Hospital Stay: Payer: BC Managed Care – PPO

## 2023-02-15 ENCOUNTER — Other Ambulatory Visit: Payer: Self-pay

## 2023-02-15 VITALS — BP 143/88 | HR 87 | Temp 98.3°F | Resp 18 | Ht 63.0 in | Wt 77.8 lb

## 2023-02-15 DIAGNOSIS — C7951 Secondary malignant neoplasm of bone: Secondary | ICD-10-CM

## 2023-02-15 DIAGNOSIS — Z170 Estrogen receptor positive status [ER+]: Secondary | ICD-10-CM | POA: Diagnosis not present

## 2023-02-15 DIAGNOSIS — Z79899 Other long term (current) drug therapy: Secondary | ICD-10-CM | POA: Diagnosis not present

## 2023-02-15 DIAGNOSIS — Z95828 Presence of other vascular implants and grafts: Secondary | ICD-10-CM

## 2023-02-15 DIAGNOSIS — G893 Neoplasm related pain (acute) (chronic): Secondary | ICD-10-CM | POA: Diagnosis not present

## 2023-02-15 DIAGNOSIS — Z5112 Encounter for antineoplastic immunotherapy: Secondary | ICD-10-CM | POA: Diagnosis not present

## 2023-02-15 DIAGNOSIS — C7931 Secondary malignant neoplasm of brain: Secondary | ICD-10-CM | POA: Diagnosis not present

## 2023-02-15 DIAGNOSIS — C50312 Malignant neoplasm of lower-inner quadrant of left female breast: Secondary | ICD-10-CM | POA: Diagnosis not present

## 2023-02-15 LAB — CBC WITH DIFFERENTIAL (CANCER CENTER ONLY)
Abs Immature Granulocytes: 0.16 10*3/uL — ABNORMAL HIGH (ref 0.00–0.07)
Basophils Absolute: 0 10*3/uL (ref 0.0–0.1)
Basophils Relative: 1 %
Eosinophils Absolute: 0.1 10*3/uL (ref 0.0–0.5)
Eosinophils Relative: 1 %
HCT: 37.6 % (ref 36.0–46.0)
Hemoglobin: 12.9 g/dL (ref 12.0–15.0)
Immature Granulocytes: 4 %
Lymphocytes Relative: 14 %
Lymphs Abs: 0.5 10*3/uL — ABNORMAL LOW (ref 0.7–4.0)
MCH: 33.9 pg (ref 26.0–34.0)
MCHC: 34.3 g/dL (ref 30.0–36.0)
MCV: 98.9 fL (ref 80.0–100.0)
Monocytes Absolute: 0.2 10*3/uL (ref 0.1–1.0)
Monocytes Relative: 5 %
Neutro Abs: 2.8 10*3/uL (ref 1.7–7.7)
Neutrophils Relative %: 75 %
Platelet Count: 213 10*3/uL (ref 150–400)
RBC: 3.8 MIL/uL — ABNORMAL LOW (ref 3.87–5.11)
RDW: 13.7 % (ref 11.5–15.5)
WBC Count: 3.7 10*3/uL — ABNORMAL LOW (ref 4.0–10.5)
nRBC: 0 % (ref 0.0–0.2)

## 2023-02-15 LAB — CMP (CANCER CENTER ONLY)
ALT: 55 U/L — ABNORMAL HIGH (ref 0–44)
AST: 33 U/L (ref 15–41)
Albumin: 3.8 g/dL (ref 3.5–5.0)
Alkaline Phosphatase: 143 U/L — ABNORMAL HIGH (ref 38–126)
Anion gap: 6 (ref 5–15)
BUN: 9 mg/dL (ref 8–23)
CO2: 29 mmol/L (ref 22–32)
Calcium: 9 mg/dL (ref 8.9–10.3)
Chloride: 103 mmol/L (ref 98–111)
Creatinine: 0.3 mg/dL — ABNORMAL LOW (ref 0.44–1.00)
GFR, Estimated: >60 mL/min (ref >60)
Glucose, Bld: 152 mg/dL — ABNORMAL HIGH (ref 70–99)
Potassium: 3.8 mmol/L (ref 3.5–5.1)
Sodium: 138 mmol/L (ref 135–145)
Total Bilirubin: 0.6 mg/dL (ref 0.3–1.2)
Total Protein: 5.7 g/dL — ABNORMAL LOW (ref 6.5–8.1)

## 2023-02-15 MED ORDER — SODIUM CHLORIDE 0.9 % IV SOLN
80.0000 mg/m2 | Freq: Once | INTRAVENOUS | Status: AC
  Administered 2023-02-15: 102 mg via INTRAVENOUS
  Filled 2023-02-15: qty 17

## 2023-02-15 MED ORDER — FAMOTIDINE IN NACL 20-0.9 MG/50ML-% IV SOLN
20.0000 mg | Freq: Once | INTRAVENOUS | Status: AC
  Administered 2023-02-15: 20 mg via INTRAVENOUS
  Filled 2023-02-15: qty 50

## 2023-02-15 MED ORDER — DIPHENHYDRAMINE HCL 50 MG/ML IJ SOLN
25.0000 mg | Freq: Once | INTRAMUSCULAR | Status: AC
  Administered 2023-02-15: 25 mg via INTRAVENOUS
  Filled 2023-02-15: qty 1

## 2023-02-15 MED ORDER — SODIUM CHLORIDE 0.9 % IV SOLN
10.0000 mg | Freq: Once | INTRAVENOUS | Status: AC
  Administered 2023-02-15: 10 mg via INTRAVENOUS
  Filled 2023-02-15: qty 10

## 2023-02-15 MED ORDER — HEPARIN SOD (PORK) LOCK FLUSH 100 UNIT/ML IV SOLN
500.0000 [IU] | Freq: Once | INTRAVENOUS | Status: AC | PRN
  Administered 2023-02-15: 500 [IU]

## 2023-02-15 MED ORDER — SODIUM CHLORIDE 0.9 % IV SOLN: Freq: Once | INTRAVENOUS | Status: AC

## 2023-02-15 MED ORDER — SODIUM CHLORIDE 0.9% FLUSH
10.0000 mL | INTRAVENOUS | Status: DC | PRN
  Administered 2023-02-15: 10 mL

## 2023-02-15 MED ORDER — SODIUM CHLORIDE 0.9% FLUSH
10.0000 mL | Freq: Once | INTRAVENOUS | Status: AC
  Administered 2023-02-15: 10 mL

## 2023-02-15 NOTE — Patient Instructions (Signed)
[facility]   Discharge Instructions: Thank you for choosing [facility] to provide your oncology and hematology care.   If you have a lab appointment with the Cancer Center, please go directly to the Cancer Center and check in at the registration area.   Wear comfortable clothing and clothing appropriate for easy access to any Portacath or PICC line.   We strive to give you quality time with your provider. You may need to reschedule your appointment if you arrive late (15 or more minutes).  Arriving late affects you and other patients whose appointments are after yours.  Also, if you miss three or more appointments without notifying the office, you may be dismissed from the clinic at the provider's discretion.      For prescription refill requests, have your pharmacy contact our office and allow 72 hours for refills to be completed.    Today you received the following chemotherapy and/or immunotherapy agents: Paclitaxel (Taxol)      To help prevent nausea and vomiting after your treatment, we encourage you to take your nausea medication as directed.  BELOW ARE SYMPTOMS THAT SHOULD BE REPORTED IMMEDIATELY: *FEVER GREATER THAN 100.4 F (38 C) OR HIGHER *CHILLS OR SWEATING *NAUSEA AND VOMITING THAT IS NOT CONTROLLED WITH YOUR NAUSEA MEDICATION *UNUSUAL SHORTNESS OF BREATH *UNUSUAL BRUISING OR BLEEDING *URINARY PROBLEMS (pain or burning when urinating, or frequent urination) *BOWEL PROBLEMS (unusual diarrhea, constipation, pain near the anus) TENDERNESS IN MOUTH AND THROAT WITH OR WITHOUT PRESENCE OF ULCERS (sore throat, sores in mouth, or a toothache) UNUSUAL RASH, SWELLING OR PAIN  UNUSUAL VAGINAL DISCHARGE OR ITCHING   Items with * indicate a potential emergency and should be followed up as soon as possible or go to the Emergency Department if any problems should occur.  Please show the CHEMOTHERAPY ALERT CARD or IMMUNOTHERAPY ALERT  CARD at check-in to the Emergency Department and triage nurse.  Should you have questions after your visit or need to cancel or reschedule your appointment, please contact [facility]  Dept: [phone]  and follow the prompts.  Office hours are 8:00 a.m. to 4:30 p.m. Monday - Friday. Please note that voicemails left after 4:00 p.m. may not be returned until the following business day.  We are closed weekends and major holidays. You have access to a nurse at all times for urgent questions. Please call the main number to the clinic Dept: [phone] and follow the prompts.   For any non-urgent questions, you may also contact your provider using MyChart. We now offer e-Visits for anyone 18 and older to request care online for non-urgent symptoms. For details visit [email or web address].   Also download the MyChart app! Go to the app store, search "MyChart", open the app, select [place], and log in with your MyChart username and password.   

## 2023-02-16 ENCOUNTER — Ambulatory Visit (HOSPITAL_COMMUNITY)
Admission: RE | Admit: 2023-02-16 | Discharge: 2023-02-16 | Disposition: A | Discharge status: Home / Self Care | Payer: BC Managed Care – PPO | Source: Ambulatory Visit | Attending: Hematology | Admitting: Hematology

## 2023-02-16 DIAGNOSIS — I700 Atherosclerosis of aorta: Secondary | ICD-10-CM | POA: Diagnosis not present

## 2023-02-16 DIAGNOSIS — C7951 Secondary malignant neoplasm of bone: Secondary | ICD-10-CM | POA: Diagnosis not present

## 2023-02-16 DIAGNOSIS — Z0189 Encounter for other specified special examinations: Secondary | ICD-10-CM

## 2023-02-16 DIAGNOSIS — I517 Cardiomegaly: Secondary | ICD-10-CM | POA: Diagnosis not present

## 2023-02-16 DIAGNOSIS — R4182 Altered mental status, unspecified: Secondary | ICD-10-CM | POA: Diagnosis not present

## 2023-02-16 DIAGNOSIS — F17200 Nicotine dependence, unspecified, uncomplicated: Secondary | ICD-10-CM | POA: Diagnosis not present

## 2023-02-16 LAB — ECHOCARDIOGRAM COMPLETE
Area-P 1/2: 3.99 cm2
S' Lateral: 2.2 cm
Single Plane A4C EF: 61.6 %

## 2023-02-16 NOTE — Progress Notes (Signed)
  Echocardiogram 2D Echocardiogram has been performed.  Janalyn Harder 02/16/2023, 8:48 AM

## 2023-02-19 ENCOUNTER — Encounter: Payer: Self-pay | Admitting: Hematology

## 2023-02-22 ENCOUNTER — Other Ambulatory Visit: Payer: BC Managed Care – PPO

## 2023-02-22 ENCOUNTER — Ambulatory Visit: Payer: BC Managed Care – PPO | Admitting: Nurse Practitioner

## 2023-02-22 ENCOUNTER — Ambulatory Visit: Payer: BC Managed Care – PPO

## 2023-02-26 MED FILL — Dexamethasone Sodium Phosphate Inj 100 MG/10ML: INTRAMUSCULAR | Qty: 1 | Status: AC

## 2023-02-27 ENCOUNTER — Inpatient Hospital Stay: Payer: BC Managed Care – PPO

## 2023-02-27 ENCOUNTER — Other Ambulatory Visit: Payer: Self-pay

## 2023-02-27 ENCOUNTER — Inpatient Hospital Stay (HOSPITAL_BASED_OUTPATIENT_CLINIC_OR_DEPARTMENT_OTHER): Payer: BC Managed Care – PPO | Admitting: Physician Assistant

## 2023-02-27 ENCOUNTER — Inpatient Hospital Stay: Payer: BC Managed Care – PPO | Attending: Genetic Counselor

## 2023-02-27 VITALS — BP 101/71 | HR 95 | Temp 98.1°F | Wt 78.4 lb

## 2023-02-27 DIAGNOSIS — C50312 Malignant neoplasm of lower-inner quadrant of left female breast: Secondary | ICD-10-CM | POA: Diagnosis not present

## 2023-02-27 DIAGNOSIS — C7931 Secondary malignant neoplasm of brain: Secondary | ICD-10-CM | POA: Diagnosis not present

## 2023-02-27 DIAGNOSIS — C7951 Secondary malignant neoplasm of bone: Secondary | ICD-10-CM | POA: Insufficient documentation

## 2023-02-27 DIAGNOSIS — C50911 Malignant neoplasm of unspecified site of right female breast: Secondary | ICD-10-CM | POA: Insufficient documentation

## 2023-02-27 DIAGNOSIS — Z171 Estrogen receptor negative status [ER-]: Secondary | ICD-10-CM | POA: Diagnosis not present

## 2023-02-27 DIAGNOSIS — Z95828 Presence of other vascular implants and grafts: Secondary | ICD-10-CM

## 2023-02-27 DIAGNOSIS — Z5111 Encounter for antineoplastic chemotherapy: Secondary | ICD-10-CM | POA: Diagnosis not present

## 2023-02-27 DIAGNOSIS — Z5112 Encounter for antineoplastic immunotherapy: Secondary | ICD-10-CM | POA: Diagnosis not present

## 2023-02-27 DIAGNOSIS — Z79899 Other long term (current) drug therapy: Secondary | ICD-10-CM | POA: Diagnosis not present

## 2023-02-27 DIAGNOSIS — Z170 Estrogen receptor positive status [ER+]: Secondary | ICD-10-CM | POA: Diagnosis not present

## 2023-02-27 DIAGNOSIS — G893 Neoplasm related pain (acute) (chronic): Secondary | ICD-10-CM | POA: Insufficient documentation

## 2023-02-27 LAB — CMP (CANCER CENTER ONLY)
ALT: 45 U/L — ABNORMAL HIGH (ref 0–44)
AST: 38 U/L (ref 15–41)
Albumin: 3.7 g/dL (ref 3.5–5.0)
Alkaline Phosphatase: 142 U/L — ABNORMAL HIGH (ref 38–126)
Anion gap: 3 — ABNORMAL LOW (ref 5–15)
BUN: 8 mg/dL (ref 8–23)
CO2: 29 mmol/L (ref 22–32)
Calcium: 8.6 mg/dL — ABNORMAL LOW (ref 8.9–10.3)
Chloride: 106 mmol/L (ref 98–111)
Creatinine: 0.27 mg/dL — ABNORMAL LOW (ref 0.44–1.00)
GFR, Estimated: >60 mL/min (ref >60)
Glucose, Bld: 109 mg/dL — ABNORMAL HIGH (ref 70–99)
Potassium: 4.3 mmol/L (ref 3.5–5.1)
Sodium: 138 mmol/L (ref 135–145)
Total Bilirubin: 0.5 mg/dL (ref 0.3–1.2)
Total Protein: 5.6 g/dL — ABNORMAL LOW (ref 6.5–8.1)

## 2023-02-27 LAB — CBC WITH DIFFERENTIAL (CANCER CENTER ONLY)
Abs Immature Granulocytes: 0.02 10*3/uL (ref 0.00–0.07)
Basophils Absolute: 0 10*3/uL (ref 0.0–0.1)
Basophils Relative: 1 %
Eosinophils Absolute: 0 10*3/uL (ref 0.0–0.5)
Eosinophils Relative: 1 %
HCT: 37.5 % (ref 36.0–46.0)
Hemoglobin: 12.8 g/dL (ref 12.0–15.0)
Immature Granulocytes: 1 %
Lymphocytes Relative: 23 %
Lymphs Abs: 0.8 10*3/uL (ref 0.7–4.0)
MCH: 34 pg (ref 26.0–34.0)
MCHC: 34.1 g/dL (ref 30.0–36.0)
MCV: 99.7 fL (ref 80.0–100.0)
Monocytes Absolute: 0.5 10*3/uL (ref 0.1–1.0)
Monocytes Relative: 13 %
Neutro Abs: 2.2 10*3/uL (ref 1.7–7.7)
Neutrophils Relative %: 61 %
Platelet Count: 264 10*3/uL (ref 150–400)
RBC: 3.76 MIL/uL — ABNORMAL LOW (ref 3.87–5.11)
RDW: 14.3 % (ref 11.5–15.5)
WBC Count: 3.5 10*3/uL — ABNORMAL LOW (ref 4.0–10.5)
nRBC: 0 % (ref 0.0–0.2)

## 2023-02-27 MED ORDER — SODIUM CHLORIDE 0.9 % IV SOLN
420.0000 mg | Freq: Once | INTRAVENOUS | Status: AC
  Administered 2023-02-27: 420 mg via INTRAVENOUS
  Filled 2023-02-27: qty 14

## 2023-02-27 MED ORDER — DIPHENHYDRAMINE HCL 50 MG/ML IJ SOLN
25.0000 mg | Freq: Once | INTRAMUSCULAR | Status: AC
  Administered 2023-02-27: 25 mg via INTRAVENOUS
  Filled 2023-02-27: qty 1

## 2023-02-27 MED ORDER — SODIUM CHLORIDE 0.9 % IV SOLN: Freq: Once | INTRAVENOUS | Status: AC

## 2023-02-27 MED ORDER — FAMOTIDINE IN NACL 20-0.9 MG/50ML-% IV SOLN
20.0000 mg | Freq: Once | INTRAVENOUS | Status: AC
  Administered 2023-02-27: 20 mg via INTRAVENOUS
  Filled 2023-02-27: qty 50

## 2023-02-27 MED ORDER — HEPARIN SOD (PORK) LOCK FLUSH 100 UNIT/ML IV SOLN
500.0000 [IU] | Freq: Once | INTRAVENOUS | Status: AC | PRN
  Administered 2023-02-27: 500 [IU]

## 2023-02-27 MED ORDER — SODIUM CHLORIDE 0.9 % IV SOLN
80.0000 mg/m2 | Freq: Once | INTRAVENOUS | Status: AC
  Administered 2023-02-27: 102 mg via INTRAVENOUS
  Filled 2023-02-27: qty 17

## 2023-02-27 MED ORDER — ACETAMINOPHEN 325 MG PO TABS
650.0000 mg | ORAL_TABLET | Freq: Once | ORAL | Status: AC
  Administered 2023-02-27: 650 mg via ORAL
  Filled 2023-02-27: qty 2

## 2023-02-27 MED ORDER — TRASTUZUMAB-ANNS CHEMO 150 MG IV SOLR
6.0000 mg/kg | Freq: Once | INTRAVENOUS | Status: AC
  Administered 2023-02-27: 210 mg via INTRAVENOUS
  Filled 2023-02-27: qty 10

## 2023-02-27 MED ORDER — SODIUM CHLORIDE 0.9 % IV SOLN
10.0000 mg | Freq: Once | INTRAVENOUS | Status: AC
  Administered 2023-02-27: 10 mg via INTRAVENOUS
  Filled 2023-02-27: qty 10

## 2023-02-27 MED ORDER — SODIUM CHLORIDE 0.9% FLUSH
10.0000 mL | INTRAVENOUS | Status: DC | PRN
  Administered 2023-02-27: 10 mL

## 2023-02-27 MED ORDER — SODIUM CHLORIDE 0.9% FLUSH
10.0000 mL | Freq: Once | INTRAVENOUS | Status: AC
  Administered 2023-02-27: 10 mL

## 2023-02-27 NOTE — Patient Instructions (Signed)
[facility]  Discharge Instructions: Thank you for choosing [facility] to provide your oncology and hematology care.   If you have a lab appointment with the Cancer Center, please go directly to the Cancer Center and check in at the registration area.   Wear comfortable clothing and clothing appropriate for easy access to any Portacath or PICC line.   We strive to give you quality time with your provider. You may need to reschedule your appointment if you arrive late (15 or more minutes).  Arriving late affects you and other patients whose appointments are after yours.  Also, if you miss three or more appointments without notifying the office, you may be dismissed from the clinic at the provider's discretion.      For prescription refill requests, have your pharmacy contact our office and allow 72 hours for refills to be completed.    Today you received the following chemotherapy and/or immunotherapy agents: Trastuzumab, Pertuzumab, Paclitaxel.       To help prevent nausea and vomiting after your treatment, we encourage you to take your nausea medication as directed.  BELOW ARE SYMPTOMS THAT SHOULD BE REPORTED IMMEDIATELY: *FEVER GREATER THAN 100.4 F (38 C) OR HIGHER *CHILLS OR SWEATING *NAUSEA AND VOMITING THAT IS NOT CONTROLLED WITH YOUR NAUSEA MEDICATION *UNUSUAL SHORTNESS OF BREATH *UNUSUAL BRUISING OR BLEEDING *URINARY PROBLEMS (pain or burning when urinating, or frequent urination) *BOWEL PROBLEMS (unusual diarrhea, constipation, pain near the anus) TENDERNESS IN MOUTH AND THROAT WITH OR WITHOUT PRESENCE OF ULCERS (sore throat, sores in mouth, or a toothache) UNUSUAL RASH, SWELLING OR PAIN  UNUSUAL VAGINAL DISCHARGE OR ITCHING   Items with * indicate a potential emergency and should be followed up as soon as possible or go to the Emergency Department if any problems should occur.  Please show the CHEMOTHERAPY ALERT CARD or  IMMUNOTHERAPY ALERT CARD at check-in to the Emergency Department and triage nurse.  Should you have questions after your visit or need to cancel or reschedule your appointment, please contact [facility]  Dept: [phone]  and follow the prompts.  Office hours are 8:00 a.m. to 4:30 p.m. Monday - Friday. Please note that voicemails left after 4:00 p.m. may not be returned until the following business day.  We are closed weekends and major holidays. You have access to a nurse at all times for urgent questions. Please call the main number to the clinic Dept: [phone] and follow the prompts.   For any non-urgent questions, you may also contact your provider using MyChart. We now offer e-Visits for anyone 18 and older to request care online for non-urgent symptoms. For details visit [email or web address].   Also download the MyChart app! Go to the app store, search "MyChart", open the app, select [facility], and log in with your MyChart username and password.   

## 2023-02-27 NOTE — Progress Notes (Signed)
[facility]   Telephone:(336) [phone] Fax:([phone]) [phone]   Clinic Follow up Note   Patient Care Team: Ardith Dark, MD as PCP - General (Family Medicine) Almond Lint, MD as Consulting Physician (General Surgery) Malachy Mood, MD as Consulting Physician (Hematology) Lonie Peak, MD as Attending Physician (Radiation Oncology) Pollyann Samples, NP as Nurse Practitioner (Nurse Practitioner)  Date of Service:  02/27/2023  CHIEF COMPLAINT: f/u of  left breast cancer and Right    CURRENT THERAPY:  Zometa q3 months/   Paclitaxel D1,8,15 + Trastuzumabd D1 + Pertuzumab D1 q21d x 8 cycles /TrastuzumabD1 +Pertuzumab D1 q21 d x4 cycles started2/22/2024   SUMMARY OF ONCOLOGIC HISTORY: Oncology History Overview Note  Cancer Staging Malignant neoplasm of lower-inner quadrant of left breast in female, estrogen receptor positive ([name]) Staging form: Breast, AJCC 8th Edition - Clinical stage from 01/16/2018: Stage IA (cT1c, cN0, cM0, G2, ER+, PR+, HER2-) - Signed by Malachy Mood, MD on 01/23/2018 - Pathologic: Stage IA (pT1c, pN1, cM0, G1, ER+, PR+, HER2-) - Signed by Lonie Peak, MD on 04/09/2018     Malignant neoplasm of lower-inner quadrant of left breast in female, estrogen receptor positive ([name])  01/15/2018 Mammogram   Diagnositc Mammogram 01/15/18  IMPRESSION: 1. Suspicious 1.2 x 1.4 x 1.3 cm mixed echogenicity mass left breast 7 o'clock position retroareolar location at the site of palpable concern.. 2. Indeterminate Within the left breast 7:30 o'clock retroareolar location, adjacent to the palpable mass, is a 0.5 x 0.4 x 0.5 cm oval circumscribed hypoechoic mass. 3. Indeterminate calcifications within the lateral left breast. Location of these calcifications is not definitely confirmed on the true lateral view.    01/16/2018 Initial Biopsy   Diagnosis 01/16/18 1. Breast, left, needle core biopsy, 7:30 o'clock (ribbon clip) - FIBROCYSTIC CHANGES WITH SCLEROSING ADENOSIS  AND CALCIFICATIONS. - FIBROADENOMATOID CHANGE. - NO MALIGNANCY IDENTIFIED. 2. Breast, left, needle core biopsy, 7 o'clock position (coil clip) - INVASIVE MAMMARY CARCINOMA, MSBR GRADE I/II. - SEE MICROSCOPIC DESCRIPTION Microscopic Comment  ADDENDUM: Immunohistochemistry for E-Cadherin is strongly positive in the tumor consistent with ductal carcinoma. (JDP:ah 01/17/18)   01/16/2018 Receptors her2   Estrogen Receptor: 100%, POSITIVE, STRONG STAINING INTENSITY Progesterone Receptor: 50%, POSITIVE, STRONG STAINING INTENSITY Proliferation Marker Ki67: 20% HER2 Negative   01/16/2018 Cancer Staging   Staging form: Breast, AJCC 8th Edition - Clinical stage from 01/16/2018: Stage IA (cT1c, cN0, cM0, G2, ER+, PR+, HER2-) - Signed by Malachy Mood, MD on 01/23/2018   01/22/2018 Initial Diagnosis   Malignant neoplasm of lower-inner quadrant of left breast in female, estrogen receptor positive ([name])   02/21/2018 Surgery    LEFT BREAST LUMPECTOMY WITH AXILLARY LYMPH NODE BIOPSY by Dr. Donell Beers  02/21/18   02/21/2018 Pathology Results   Diagnosis 02/21/18 1. Breast, lumpectomy, Left - INVASIVE DUCTAL CARCINOMA, GRADE I, 1.6 CM. - DUCTAL CARCINOMA IN SITU, INTERMEDIATE NUCLEAR GRADE. - ANTERIOR AND MEDIAL RESECTION MARGINS ARE POSITIVE FOR CARCINOMA. - NEGATIVE FOR LYMPHOVASCULAR OR PERINEURAL INVASION. - BACKGROUND BREAST TISSUE WITH FIBROCYSTIC CHANGE, INCLUDING SCLEROSING ADENOSIS. - BIOPSY SITE CHANGES. - SEE ONCOLOGY TABLE. 2. Lymph node, sentinel, biopsy, Left Axillary #1 - METASTATIC BREAST CARCINOMA TO A LYMPH NODE, 1.0 CM IN GREATEST DIMENSION, WITH EXTRANODAL EXTENSION (1/1). 3. Lymph node, sentinel, biopsy, Left Axillary #2 - LYMPH NODE, NEGATIVE FOR CARCINOMA (0/1).    02/21/2018 Miscellaneous   Mammaprint 02/21/18 Low Risk with 10-year risk of recurrnce at 10% -No potential signifcant chemotherapy benefit   03/20/2018 Pathology Results   RE-EXCISION  OF BREAST LUMPECTOMY by Dr. Donell Beers   Diagnosis 03/20/18 1. Breast, excision, Left new anterior margin - FIBROCYSTIC CHANGES WITH ADENOSIS AND CALCIFICATIONS. - HEALING BIOPSY SITE. - THERE IS NO EVIDENCE OF MALIGNANCY. 2. Breast, excision, Left new medial margin - FIBROCYSTIC CHANGES WITH ADENOSIS AND CALCIFICATIONS. - HEALING BIOPSY SITE. - THERE IS NO EVIDENCE OF MALIGNANCY. Microscopic Comment 1. -2. The surgical resection margin(s) of the specimen were inked and microscopically evaluated. (JBK:kh 03-22-18)   04/09/2018 Cancer Staging   Staging form: Breast, AJCC 8th Edition - Pathologic: Stage IA (pT1c, pN1, cM0, G1, ER+, PR+, HER2-) - Signed by Lonie Peak, MD on 04/09/2018   04/22/2018 - 06/03/2018 Radiation Therapy   Radaiton with Dr. Basilio Cairo 04/22/18-06/03/18   05/2018 -  Anti-estrogen oral therapy   Letrozole 2.5mg  started 05/2018    Survivorship   Per Santiago Glad, NP    05/04/2022 Imaging    IMPRESSION: Cervical spondylosis, as described.   Nonspecific straightening of the expected cervical lordosis.   07/24/2022 Imaging    IMPRESSION: 1. Extensive osseous metastatic disease with pathologic fracture at the base of dens and C2 right lateral mass. Extraosseous tumor at C1 and C2 likely impinging on the right C2 and C3 nerve roots. 2. Degenerative cord impingement at C4-5 to C6-7. Biforaminal impingement at C5-6 and C6-7.   08/03/2022 PET scan    IMPRESSION: 1. Large volume osseous metastasis. 2. Low right cervical and probable right axillary nodal metastasis. 3. Subtle heterogeneous activity throughout the liver with suggestion of small liver lesions (likely new compared to chest CT of 07/16/2020). Findings are overall moderately suspicious for hepatic metastasis. Pre and post contrast abdominal MRI (preferred) or CT could confirm. 4. Right-sided pleural thickening and trace pleural fluid. Right base airspace disease is favored to represent chronic atelectasis. 5. Aortic atherosclerosis  (ICD10-I70.0) and emphysema (ICD10-J43.9).     08/11/2022 Genetic Testing   Negative genetic testing on the CancerNext-Expanded+RNAinsight panel.  The report date is August 11, 2022.  The CancerNext-Expanded gene panel offered by [facility] and includes sequencing and rearrangement analysis for the following 77 genes: AIP, ALK, APC*, ATM*, AXIN2, BAP1, BARD1, BLM, BMPR1A, BRCA1*, BRCA2*, BRIP1*, CDC73, CDH1*, CDK4, CDKN1B, CDKN2A, CHEK2*, CTNNA1, DICER1, FANCC, FH, FLCN, GALNT12, KIF1B, LZTR1, MAX, MEN1, MET, MLH1*, MSH2*, MSH3, MSH6*, MUTYH*, NBN, NF1*, NF2, NTHL1, PALB2*, PHOX2B, PMS2*, POT1, PRKAR1A, PTCH1, PTEN*, RAD51C*, RAD51D*, RB1, RECQL, RET, SDHA, SDHAF2, SDHB, SDHC, SDHD, SMAD4, SMARCA4, SMARCB1, SMARCE1, STK11, SUFU, TMEM127, TP53*, TSC1, TSC2, VHL and XRCC2 (sequencing and deletion/duplication); EGFR, EGLN1, HOXB13, KIT, MITF, PDGFRA, POLD1, and POLE (sequencing only); EPCAM and GREM1 (deletion/duplication only). DNA and RNA analyses performed for * genes.    Lobular carcinoma in situ (LCIS) of right breast  01/20/2020 Mammogram   Diagnostic Mammogram 01/20/20 IMPRESSION: 1.  Stable post lumpectomy changes of the left breast.   2. Suspicious microcalcifications over the right upper outer quadrant spanning 3.6 cm.   02/02/2020 Initial Biopsy   Diagnosis 02/02/20 Breast, right, needle core biopsy, upper outer quadrant, x clip - LOBULAR CARCINOMA IN SITU WITH PLEOMORPHIC FEATURES AND CALCIFICATIONS, INVOLVING ADENOSIS. SEE NOTE Diagnosis Note Immunohistochemical stain for E-cadherin is negative in the lesional cells, consistent with a lobular phenotype. Immunostains for p63, SMM 1 and calponin do not show evidence of invasive carcinoma.    02/04/2020 Initial Diagnosis   Lobular carcinoma in situ (LCIS) of right breast   03/18/2020 Surgery   RIGHT BREAST LUMPECTOMY WITH RADIOACTIVE SEED LOCALIZATION by Dr Alvira Monday    03/18/2020 Pathology  Results   FINAL MICROSCOPIC DIAGNOSIS:    A. BREAST, RIGHT, LUMPECTOMY:  - Pleomorphic lobular carcinoma in situ with calcifications and  underlying complex sclerosing lesion, adenosis and fibroadenomatoid  change.  - Margins of resection are not involved (Closest margins: < 1 mm,  anterior, posterior, inferior and medial).  - Biopsy site.    COMMENT:   P63, Calponin and SMM-1 demonstrate the presence of myoepithelium in the  select focus.    Metastatic breast cancer to bone and brain  05/04/2022 Imaging    IMPRESSION: Cervical spondylosis, as described.   Nonspecific straightening of the expected cervical lordosis.   07/24/2022 Imaging    IMPRESSION: 1. Extensive osseous metastatic disease with pathologic fracture at the base of dens and C2 right lateral mass. Extraosseous tumor at C1 and C2 likely impinging on the right C2 and C3 nerve roots. 2. Degenerative cord impingement at C4-5 to C6-7. Biforaminal impingement at C5-6 and C6-7.   08/02/2022 Initial Diagnosis   Cancer, metastatic to bone ([name])   08/03/2022 PET scan    IMPRESSION: 1. Large volume osseous metastasis. 2. Low right cervical and probable right axillary nodal metastasis. 3. Subtle heterogeneous activity throughout the liver with suggestion of small liver lesions (likely new compared to chest CT of 07/16/2020). Findings are overall moderately suspicious for hepatic metastasis. Pre and post contrast abdominal MRI (preferred) or CT could confirm. 4. Right-sided pleural thickening and trace pleural fluid. Right base airspace disease is favored to represent chronic atelectasis. 5. Aortic atherosclerosis (ICD10-I70.0) and emphysema (ICD10-J43.9).     10/26/2022 -  Chemotherapy   Patient is on Treatment Plan : BREAST Paclitaxel D1,8,15 + Trastuzumab D1 + Pertuzumab D1 q21d x 8 cycles / Trastuzumab D1 + Pertuzumab D1 q21d x 4 cycles        INTERVAL HISTORY:  Martha [name] is here for a follow up of breast cancer . She was last seen by  Dr. Mosetta Putt on 02/08/2023. She presents to the clinic accompanied by husband.  Ms. Levendusky reports that she is tolerating treatment without any new toxicities. Her energy levels are fairly stable. She doe shave fatigue with treatment which requires resting. She is trying to maintain her weight with drinking 2 protein shakes per day. She denies nausea, vomiting or abdominal pain. She does have 2-3 days of diarrhea per day which is well controlled with imodium. She denies easy bruising or signs of bleeding. She denies having any pain. Her neuropathy in fingertips is stable and has not worsened with chemotherapy. She denies fevers, chills, sweats, shortness of breath, chest pain or cough. She has no other complaints.    All other systems were reviewed with the patient and are negative.  MEDICAL HISTORY:  Past Medical History:  Diagnosis Date   Anxiety    Cancer ([name]) 2022   right breast LCIS   Cancer ([name]) 2019   left breast   Depression    Dysrhythmia    SVT, s/p ablation ~ 2012 in at [facility]   Ectopic pregnancy    Family history of breast cancer    Family history of melanoma    History of radiation therapy 04/22/18-06/03/18   Left Breast, left SCV, axilla 50 Gy in 25 fractions, Left breast boost 10 Gy in 5 fractions.    Hypothyroidism    Personal history of radiation therapy    PONV (postoperative nausea and vomiting)    Thyroid disease     SURGICAL HISTORY: Past Surgical History:  Procedure Laterality  Date   APPENDECTOMY     BILATERAL SALPINGECTOMY     BREAST BIOPSY Right 2014   fibroadenoma   BREAST BIOPSY Left 01/16/2018   BREAST BIOPSY Right 02/02/2020   LCIS   BREAST BIOPSY Right 08/04/2020   x2 LCIS   BREAST BIOPSY Right 08/13/2020   x2   BREAST EXCISIONAL BIOPSY Left 1990   benign   BREAST EXCISIONAL BIOPSY Right 02/02/2020   LCIS   BREAST LUMPECTOMY Left 01/22/2018   BREAST LUMPECTOMY WITH AXILLARY LYMPH NODE BIOPSY Left 02/21/2018   Procedure: LEFT  BREAST LUMPECTOMY WITH AXILLARY LYMPH NODE BIOPSY;  Surgeon: Almond Lint, MD;  Location: [facility] OR;  Service: General;  Laterality: Left;   BREAST LUMPECTOMY WITH RADIOACTIVE SEED LOCALIZATION Right 03/18/2020   Procedure: RIGHT BREAST LUMPECTOMY WITH RADIOACTIVE SEED LOCALIZATION;  Surgeon: Almond Lint, MD;  Location: [facility] OR;  Service: General;  Laterality: Right;   BREAST SURGERY Left 1993   cyst removed   ENDOMETRIAL ABLATION     EYE SURGERY     GLAUCOMA SURGERY Bilateral    IR IMAGING GUIDED PORT INSERTION  10/16/2022   POSTERIOR CERVICAL FUSION/FORAMINOTOMY N/A 08/17/2022   Procedure: Cervical One-Cervical Four POSTERIOR CERVICAL FUSION,  Cervical One LAMINECTOMY REDUCTION OF Cervical Two Fracture, Biopsy of Right Cerical Two Pars  Lesion;  Surgeon: Bedelia Person, MD;  Location[facility] OR;  Service: Neurosurgery;  Laterality: N/A;   POSTERIOR CERVICAL FUSION/FORAMINOTOMY N/A 09/06/2022   Procedure: Posterior Cervical Fusion, Foraminotomy , Cervical Five-Six, Cervical Six-Seven; Extension of  fusion Cervical Four- Thoracic One;  Surgeon: Bedelia Person, MD;  Location: [facility] OR;  Service: Neurosurgery;  Laterality: N/A;   RADIOACTIVE SEED GUIDED EXCISIONAL BREAST BIOPSY Right 04/28/2021   Procedure: RADIOACTIVE SEED GUIDED EXCISIONAL RIGHT BREAST BIOPSY X2;  Surgeon: Almond Lint, MD;  Location: [facility];  Service: General;  Laterality: Right;   RE-EXCISION OF BREAST LUMPECTOMY Left 03/20/2018   Procedure: RE-EXCISION OF BREAST LUMPECTOMY;  Surgeon: Almond Lint, MD;  Location: [facility];  Service: General;  Laterality: Left;    I have reviewed the social history and family history with the patient and they are unchanged from previous note.  ALLERGIES:  is allergic to morphine and codeine.  MEDICATIONS:  Current Outpatient Medications  Medication Sig Dispense Refill   acetaminophen (TYLENOL) 500 MG tablet Take 2 tablets (1,000 mg total) by mouth every 8  (eight) hours. 30 tablet 0   citalopram (CELEXA) 40 MG tablet Take 1 tablet (40 mg total) by mouth daily. 30 tablet 3   levothyroxine (SYNTHROID) 75 MCG tablet Take 1 tablet (75 mcg total) by mouth daily before breakfast. 30 tablet 0   lidocaine (XYLOCAINE) 2 % solution Patient: Mix 1part 2% viscous lidocaine, 1part H20. Swallow 10mL of diluted mixture, before meals and at bedtime, up to QID 100 mL 3   lidocaine-prilocaine (EMLA) cream Apply to affected area once (Patient taking differently: Apply 1 Application topically daily as needed (burning).) 30 g 3   Loperamide HCl (IMODIUM PO) Take by mouth as needed.     LORazepam (ATIVAN) 0.5 MG tablet Take 1 tablet (0.5 mg total) by mouth every 6 (six) hours as needed for anxiety (agitation). 30 tablet 0   Mouthwashes (MOUTH RINSE) LIQD solution 15 mLs by Mouth Rinse route as needed (for oral care).  0   ondansetron (ZOFRAN) 8 MG tablet Take 1 tablet (8 mg total) by mouth every 8 (eight) hours as needed for nausea or vomiting. 20  tablet 1   OVER THE COUNTER MEDICATION Take 1 tablet by mouth daily. Protandim Supplement     oxyCODONE (OXY IR/ROXICODONE) 5 MG immediate release tablet Take 0.5-1 tablets (2.5-5 mg total) by mouth every 4 (four) hours as needed for moderate pain or severe pain (dyspnea). 30 tablet 0   pregabalin (LYRICA) 25 MG capsule Take 1 capsule (25 mg total) by mouth 2 (two) times daily. 60 capsule 2   senna-docusate (SENOKOT-S) 8.6-50 MG tablet Take 1 tablet by mouth 2 (two) times daily. 60 tablet 0   No current facility-administered medications for this visit.    PHYSICAL EXAMINATION: ECOG PERFORMANCE STATUS: 3 - Symptomatic, >50% confined to bed  Vitals:   02/27/23 1110  BP: 101/71  Pulse: 95  Temp: 98.1 F (36.7 C)  SpO2: 97%   Wt Readings from Last 3 Encounters:  02/27/23 78 lb 6.4 oz (35.6 kg)  02/15/23 77 lb 13.2 oz (35.3 kg)  02/08/23 79 lb 1 oz (35.9 kg)     GENERAL:alert, no distress and comfortable SKIN:  skin color normal, no rashes or significant lesions EYES: normal, Conjunctiva are pink and non-injected, sclera clear  NEURO: alert & oriented x 3 with fluent speech LUNGS: clear to auscultation and percussion with normal breathing effort HEART: regular rate & rhythm and no murmurs and no lower extremity edema  LABORATORY DATA:  I have reviewed the data as listed    Latest Ref Rng & Units 02/27/2023   10:47 AM 02/15/2023    7:36 AM 02/08/2023    7:50 AM  CBC  WBC 4.0 - 10.5 K/uL 3.5  3.7  4.4   Hemoglobin 12.0 - 15.0 g/dL 16.1  09.6  04.5   Hematocrit 36.0 - 46.0 % 37.5  37.6  38.3   Platelets 150 - 400 K/uL 264  213  251         Latest Ref Rng & Units 02/27/2023   10:47 AM 02/15/2023    7:36 AM 02/08/2023    7:50 AM  CMP  Glucose 70 - 99 mg/dL 409  811  914   BUN 8 - 23 mg/dL 8  9  13    Creatinine 0.44 - 1.00 mg/dL 7.82  9.56  <2.13   Sodium 135 - 145 mmol/L 138  138  139   Potassium 3.5 - 5.1 mmol/L 4.3  3.8  3.6   Chloride 98 - 111 mmol/L 106  103  104   CO2 22 - 32 mmol/L 29  29  31    Calcium 8.9 - 10.3 mg/dL 8.6  9.0  8.8   Total Protein 6.5 - 8.1 g/dL 5.6  5.7  5.9   Total Bilirubin 0.3 - 1.2 mg/dL 0.5  0.6  0.5   Alkaline Phos 38 - 126 U/L 142  143  157   AST 15 - 41 U/L 38  33  83   ALT 0 - 44 U/L 45  55  100       RADIOGRAPHIC STUDIES: I have personally reviewed the radiological images as listed and agreed with the findings in the report. No results found.    ASSESSMENT:  Martha [name] is a 62 y.o. female who returns for a follow up for breast cancer.   #Metastatic breast cancer to bone and brain, ER-/PR-/HER2+ -Diagnosed in 06/2022 --PET scan from 08/03/2022 showed diffuse bone mets  -she underwent cervical laminectomy and fusion by Dr. Maisie Fus on 08/17/2022, biopsy confirmed metastatic breast cancer, ER/PR negative, HER2 positive. -due to  cervical cord compression with myelopathy, she underwent a second cervical spine surgery on September 06, 2022, and  completed inpatient rehabitation. -She has completed brain, cervical and lumbar spine radiation in Feb 2024 -She started first line systemic chemotherapy with weekly Taxol and trastuzumab/Perjeta on 10/25/22.  -PET scan from Jan 01, 2023 showed complete metabolic response, no hypermetabolic uptake in diffuse bone lesions, no other new lesions. Given her excellent response to chemo and clinical improvement , we changed her Taxol to 2 weeks on and 1 week off.   #Malignant neoplasm of lower-inner quadrant of left breast in female, estrogen receptor positive ([name]) She was diagnosed in 12/2017. She is s/p left breast lumpectomy and adjuvant radiation.  -She started anti-estrogen therapy with letrozole on 05/2018. Tolerating well with no issues -lost f/u after visit in 04/2020 until her recurrence in 07/2022     #Cancer related pain She is on low-dose oxycodone, she has developed a significant pain in the right rib cage, probably related to her bone metastasis  -f/u with palliative care clinic NP [facility]   #Diarrhea: --1-2 episodes per day, lasting 2-3 days after chemotherapy --Symptoms improved with imodium     PLAN: --Due for Cycle 7, Day 1 of Taxol and Kanjinti/Perjeta. --Labs from today were reviewed and adequate for treatment. WBC 3.5, Hgb 12.8, Plt 264. Creatinine 0.27, LFTs improving.  --Proceed with treatment today without any dose modifications.  --Echo from 02/16/2023 with EF 55-60%. --RTC on 03/22/2023 for labs and follow up with Dr. Mosetta Putt before Cycle 8, Day 1   No orders of the defined types were placed in this encounter.  All questions were answered. The patient knows to call the clinic with any problems, questions or concerns. No barriers to learning was detected.  I have spent a total of 30 minutes minutes of face-to-face and non-face-to-face time, preparing to see the patient, performing a medically appropriate examination, counseling and educating the patient, documenting clinical  information in the electronic health record,  and care coordination.   Georga Kaufmann PA-C Dept of Hematology and Oncology [facility] at [facility] Phone: [phone]

## 2023-03-02 ENCOUNTER — Other Ambulatory Visit: Payer: BC Managed Care – PPO

## 2023-03-02 ENCOUNTER — Ambulatory Visit: Payer: BC Managed Care – PPO | Admitting: Hematology

## 2023-03-02 ENCOUNTER — Ambulatory Visit: Payer: BC Managed Care – PPO

## 2023-03-07 MED FILL — Dexamethasone Sodium Phosphate Inj 100 MG/10ML: INTRAMUSCULAR | Qty: 1 | Status: AC

## 2023-03-07 NOTE — Progress Notes (Unsigned)
Palliative Medicine [facility]  [phone] [phone]   Name: Martha [name] Date: 03/07/2023 MRN: [id number]  DOB: May 13, 1961  Patient Care Team: Ardith Dark, MD as PCP - General (Family Medicine) Almond Lint, MD as Consulting Physician (General Surgery) Malachy Mood, MD as Consulting Physician (Hematology) Lonie Peak, MD as Attending Physician (Radiation Oncology) Pollyann Samples, NP as Nurse Practitioner (Nurse Practitioner)    INTERVAL HISTORY: Martha [name] is a 62 y.o. female with oncologic medical history including ER+ breast cancer (01/2018), right breast cancer (04/2021), now with metastatic disease progression involving brain and liver, pathological fractures with osseous mets s/p brain radiation. .  Palliative ask to see for symptom management and goals of care.   SOCIAL HISTORY:    Martha [name] reports that she has been smoking cigarettes. She has a 20.00 pack-year smoking history. She has never used smokeless tobacco. She reports that she does not currently use alcohol after a past usage of about 28.0 standard drinks of alcohol per week. She reports that she does not use drugs.  ADVANCE DIRECTIVES:  Has documents on file  CODE STATUS: DNR  PAST MEDICAL HISTORY: Past Medical History:  Diagnosis Date   Anxiety    Cancer ([name]) 2022   right breast LCIS   Cancer ([name]) 2019   left breast   Depression    Dysrhythmia    SVT, s/p ablation ~ 2012 in at [facility]   Ectopic pregnancy    Family history of breast cancer    Family history of melanoma    History of radiation therapy 04/22/18-06/03/18   Left Breast, left SCV, axilla 50 Gy in 25 fractions, Left breast boost 10 Gy in 5 fractions.    Hypothyroidism    Personal history of radiation therapy    PONV (postoperative nausea and vomiting)    Thyroid disease     ALLERGIES:  is allergic to morphine and codeine.  MEDICATIONS:  Current  Outpatient Medications  Medication Sig Dispense Refill   acetaminophen (TYLENOL) 500 MG tablet Take 2 tablets (1,000 mg total) by mouth every 8 (eight) hours. 30 tablet 0   citalopram (CELEXA) 40 MG tablet Take 1 tablet (40 mg total) by mouth daily. 30 tablet 3   levothyroxine (SYNTHROID) 75 MCG tablet Take 1 tablet (75 mcg total) by mouth daily before breakfast. 30 tablet 0   lidocaine (XYLOCAINE) 2 % solution Patient: Mix 1part 2% viscous lidocaine, 1part H20. Swallow 10mL of diluted mixture, before meals and at bedtime, up to QID 100 mL 3   lidocaine-prilocaine (EMLA) cream Apply to affected area once (Patient taking differently: Apply 1 Application topically daily as needed (burning).) 30 g 3   Loperamide HCl (IMODIUM PO) Take by mouth as needed.     LORazepam (ATIVAN) 0.5 MG tablet Take 1 tablet (0.5 mg total) by mouth every 6 (six) hours as needed for anxiety (agitation). 30 tablet 0   Mouthwashes (MOUTH RINSE) LIQD solution 15 mLs by Mouth Rinse route as needed (for oral care).  0   ondansetron (ZOFRAN) 8 MG tablet Take 1 tablet (8 mg total) by mouth every 8 (eight) hours as needed for nausea or vomiting. 20 tablet 1   OVER THE COUNTER MEDICATION Take 1 tablet by mouth daily. Protandim Supplement     oxyCODONE (OXY IR/ROXICODONE) 5 MG immediate release tablet Take 0.5-1 tablets (2.5-5 mg total) by mouth every 4 (four) hours as needed for moderate pain or severe  pain (dyspnea). 30 tablet 0   pregabalin (LYRICA) 25 MG capsule Take 1 capsule (25 mg total) by mouth 2 (two) times daily. 60 capsule 2   senna-docusate (SENOKOT-S) 8.6-50 MG tablet Take 1 tablet by mouth 2 (two) times daily. 60 tablet 0   No current facility-administered medications for this visit.    VITAL SIGNS: There were no vitals taken for this visit. There were no vitals filed for this visit.  Estimated body mass index is 13.89 kg/m as calculated from the following:   Height as of 02/15/23: 5\' 3"  (1.6 m).   Weight  as of 02/27/23: 78 lb 6.4 oz (35.6 kg).   PERFORMANCE STATUS (ECOG) : 3 - Symptomatic, >50% confined to bed   Physical Exam General: NAD, thin   Cardiovascular: regular rate and rhythm Pulmonary: normal breathing pattern Abdomen: soft, nontender, + bowel sounds Extremities: no edema, no joint deformities Skin: no rashes, muscle wasting  Neurological: alert, oriented x 4  IMPRESSION: Martha [name] presents to clinic for follow-up. Her husband is present. No acute distress. [place]. She and husband was excited expressing they have not taken a trip in over a year. Denies nausea, vomiting, constipation, or diarrhea. Is trying to increase activity as tolerated.   Decreased appetite/weight loss Much improved. Current weight is 79lbs. Up from 77lbs on 5/23. Shares her goal was to reach 80lbs short-term and excited to reach this and increase a new goal.    Pain Martha [name] shares pain is minimal. Occasional aches and pains. Lyrica as prescribed. Tylenol as needed. Describes pain in sacral and back area. Some stiffness and tenderness to neck area.   No symptom management needs at this time. Will continue to support as needed.   PLAN: Continue intake of Ensure and soft, high-calorie foods. Continue Lyrica 25 mg twice daily. Tylenol as needed for mild aches and pain.  Protective skin barrier to sacral area. Ongoing goals of care discussions and symptom management Palliative will plan to see patient back in 2-4 weeks in collaboration to other oncology appointments.  Patient and husband knows to contact office sooner if needed.   Patient expressed understanding and was in agreement with this plan. She also understands that She can call the clinic at any time with any questions, concerns, or complaints.      Any controlled substances utilized were prescribed in the context of palliative care. PDMP has been reviewed.   Visit consisted of  counseling and education dealing with the complex and emotionally intense issues of symptom management and palliative care in the setting of serious and potentially life-threatening illness.Greater than 50%  of this time was spent counseling and coordinating care related to the above assessment and plan.  Willette Alma, AGPCNP-BC  Palliative Medicine Team/[facility]

## 2023-03-08 ENCOUNTER — Inpatient Hospital Stay: Payer: BC Managed Care – PPO

## 2023-03-08 ENCOUNTER — Other Ambulatory Visit: Payer: Self-pay

## 2023-03-08 ENCOUNTER — Encounter: Payer: Self-pay | Admitting: Nurse Practitioner

## 2023-03-08 ENCOUNTER — Inpatient Hospital Stay (HOSPITAL_BASED_OUTPATIENT_CLINIC_OR_DEPARTMENT_OTHER): Payer: BC Managed Care – PPO | Admitting: Nurse Practitioner

## 2023-03-08 VITALS — BP 121/82 | HR 79 | Resp 19

## 2023-03-08 VITALS — BP 120/76 | HR 85 | Temp 98.5°F | Resp 18 | Wt 79.5 lb

## 2023-03-08 DIAGNOSIS — Z515 Encounter for palliative care: Secondary | ICD-10-CM | POA: Diagnosis not present

## 2023-03-08 DIAGNOSIS — R197 Diarrhea, unspecified: Secondary | ICD-10-CM

## 2023-03-08 DIAGNOSIS — C7931 Secondary malignant neoplasm of brain: Secondary | ICD-10-CM | POA: Diagnosis not present

## 2023-03-08 DIAGNOSIS — C50911 Malignant neoplasm of unspecified site of right female breast: Secondary | ICD-10-CM | POA: Diagnosis not present

## 2023-03-08 DIAGNOSIS — Z79899 Other long term (current) drug therapy: Secondary | ICD-10-CM | POA: Diagnosis not present

## 2023-03-08 DIAGNOSIS — Z5112 Encounter for antineoplastic immunotherapy: Secondary | ICD-10-CM | POA: Diagnosis not present

## 2023-03-08 DIAGNOSIS — Z170 Estrogen receptor positive status [ER+]: Secondary | ICD-10-CM | POA: Diagnosis not present

## 2023-03-08 DIAGNOSIS — F32A Depression, unspecified: Secondary | ICD-10-CM

## 2023-03-08 DIAGNOSIS — Z95828 Presence of other vascular implants and grafts: Secondary | ICD-10-CM

## 2023-03-08 DIAGNOSIS — C7951 Secondary malignant neoplasm of bone: Secondary | ICD-10-CM | POA: Diagnosis not present

## 2023-03-08 DIAGNOSIS — Z171 Estrogen receptor negative status [ER-]: Secondary | ICD-10-CM | POA: Diagnosis not present

## 2023-03-08 DIAGNOSIS — M792 Neuralgia and neuritis, unspecified: Secondary | ICD-10-CM

## 2023-03-08 DIAGNOSIS — G893 Neoplasm related pain (acute) (chronic): Secondary | ICD-10-CM | POA: Diagnosis not present

## 2023-03-08 DIAGNOSIS — C50312 Malignant neoplasm of lower-inner quadrant of left female breast: Secondary | ICD-10-CM | POA: Diagnosis not present

## 2023-03-08 LAB — CBC WITH DIFFERENTIAL (CANCER CENTER ONLY)
Abs Immature Granulocytes: 0.03 10*3/uL (ref 0.00–0.07)
Basophils Absolute: 0 10*3/uL (ref 0.0–0.1)
Basophils Relative: 1 %
Eosinophils Absolute: 0 10*3/uL (ref 0.0–0.5)
Eosinophils Relative: 1 %
HCT: 37.2 % (ref 36.0–46.0)
Hemoglobin: 12.5 g/dL (ref 12.0–15.0)
Immature Granulocytes: 1 %
Lymphocytes Relative: 17 %
Lymphs Abs: 0.7 10*3/uL (ref 0.7–4.0)
MCH: 33.5 pg (ref 26.0–34.0)
MCHC: 33.6 g/dL (ref 30.0–36.0)
MCV: 99.7 fL (ref 80.0–100.0)
Monocytes Absolute: 0.3 10*3/uL (ref 0.1–1.0)
Monocytes Relative: 8 %
Neutro Abs: 3 10*3/uL (ref 1.7–7.7)
Neutrophils Relative %: 72 %
Platelet Count: 221 10*3/uL (ref 150–400)
RBC: 3.73 MIL/uL — ABNORMAL LOW (ref 3.87–5.11)
RDW: 14.1 % (ref 11.5–15.5)
WBC Count: 4.2 10*3/uL (ref 4.0–10.5)
nRBC: 0 % (ref 0.0–0.2)

## 2023-03-08 LAB — CMP (CANCER CENTER ONLY)
ALT: 31 U/L (ref 0–44)
AST: 28 U/L (ref 15–41)
Albumin: 3.7 g/dL (ref 3.5–5.0)
Alkaline Phosphatase: 117 U/L (ref 38–126)
Anion gap: 4 — ABNORMAL LOW (ref 5–15)
BUN: 10 mg/dL (ref 8–23)
CO2: 30 mmol/L (ref 22–32)
Calcium: 9.1 mg/dL (ref 8.9–10.3)
Chloride: 105 mmol/L (ref 98–111)
Creatinine: <0.3 mg/dL — ABNORMAL LOW (ref 0.44–1.00)
Glucose, Bld: 102 mg/dL — ABNORMAL HIGH (ref 70–99)
Potassium: 3.9 mmol/L (ref 3.5–5.1)
Sodium: 139 mmol/L (ref 135–145)
Total Bilirubin: 0.4 mg/dL (ref 0.3–1.2)
Total Protein: 5.9 g/dL — ABNORMAL LOW (ref 6.5–8.1)

## 2023-03-08 MED ORDER — PREGABALIN 25 MG PO CAPS
25.0000 mg | ORAL_CAPSULE | Freq: Two times a day (BID) | ORAL | 2 refills | Status: DC
Start: 2023-03-08 — End: 2023-06-11

## 2023-03-08 MED ORDER — HEPARIN SOD (PORK) LOCK FLUSH 100 UNIT/ML IV SOLN
500.0000 [IU] | Freq: Once | INTRAVENOUS | Status: AC | PRN
  Administered 2023-03-08: 500 [IU]

## 2023-03-08 MED ORDER — SODIUM CHLORIDE 0.9% FLUSH
10.0000 mL | Freq: Once | INTRAVENOUS | Status: AC
  Administered 2023-03-08: 10 mL

## 2023-03-08 MED ORDER — SODIUM CHLORIDE 0.9 % IV SOLN
10.0000 mg | Freq: Once | INTRAVENOUS | Status: AC
  Administered 2023-03-08: 10 mg via INTRAVENOUS
  Filled 2023-03-08: qty 10

## 2023-03-08 MED ORDER — SODIUM CHLORIDE 0.9 % IV SOLN: Freq: Once | INTRAVENOUS | Status: AC

## 2023-03-08 MED ORDER — DIPHENOXYLATE-ATROPINE 2.5-0.025 MG PO TABS
1.0000 | ORAL_TABLET | Freq: Four times a day (QID) | ORAL | 0 refills | Status: DC | PRN
Start: 2023-03-08 — End: 2023-04-23

## 2023-03-08 MED ORDER — DIPHENHYDRAMINE HCL 50 MG/ML IJ SOLN
25.0000 mg | Freq: Once | INTRAMUSCULAR | Status: AC
  Administered 2023-03-08: 25 mg via INTRAVENOUS
  Filled 2023-03-08: qty 1

## 2023-03-08 MED ORDER — SODIUM CHLORIDE 0.9% FLUSH
10.0000 mL | INTRAVENOUS | Status: DC | PRN
  Administered 2023-03-08: 10 mL

## 2023-03-08 MED ORDER — CITALOPRAM HYDROBROMIDE 40 MG PO TABS
40.0000 mg | ORAL_TABLET | Freq: Every day | ORAL | 3 refills | Status: DC
Start: 2023-03-08 — End: 2023-07-12

## 2023-03-08 MED ORDER — SODIUM CHLORIDE 0.9 % IV SOLN
80.0000 mg/m2 | Freq: Once | INTRAVENOUS | Status: AC
  Administered 2023-03-08: 102 mg via INTRAVENOUS
  Filled 2023-03-08: qty 17

## 2023-03-08 MED ORDER — FAMOTIDINE IN NACL 20-0.9 MG/50ML-% IV SOLN
20.0000 mg | Freq: Once | INTRAVENOUS | Status: AC
  Administered 2023-03-08: 20 mg via INTRAVENOUS
  Filled 2023-03-08: qty 50

## 2023-03-08 NOTE — Patient Instructions (Signed)
[facility]   Discharge Instructions: Thank you for choosing [facility] to provide your oncology and hematology care.   If you have a lab appointment with the Cancer Center, please go directly to the Cancer Center and check in at the registration area.   Wear comfortable clothing and clothing appropriate for easy access to any Portacath or PICC line.   We strive to give you quality time with your provider. You may need to reschedule your appointment if you arrive late (15 or more minutes).  Arriving late affects you and other patients whose appointments are after yours.  Also, if you miss three or more appointments without notifying the office, you may be dismissed from the clinic at the provider's discretion.      For prescription refill requests, have your pharmacy contact our office and allow 72 hours for refills to be completed.    Today you received the following chemotherapy and/or immunotherapy agents: Paclitaxel (Taxol)      To help prevent nausea and vomiting after your treatment, we encourage you to take your nausea medication as directed.  BELOW ARE SYMPTOMS THAT SHOULD BE REPORTED IMMEDIATELY: *FEVER GREATER THAN 100.4 F (38 C) OR HIGHER *CHILLS OR SWEATING *NAUSEA AND VOMITING THAT IS NOT CONTROLLED WITH YOUR NAUSEA MEDICATION *UNUSUAL SHORTNESS OF BREATH *UNUSUAL BRUISING OR BLEEDING *URINARY PROBLEMS (pain or burning when urinating, or frequent urination) *BOWEL PROBLEMS (unusual diarrhea, constipation, pain near the anus) TENDERNESS IN MOUTH AND THROAT WITH OR WITHOUT PRESENCE OF ULCERS (sore throat, sores in mouth, or a toothache) UNUSUAL RASH, SWELLING OR PAIN  UNUSUAL VAGINAL DISCHARGE OR ITCHING   Items with * indicate a potential emergency and should be followed up as soon as possible or go to the Emergency Department if any problems should occur.  Please show the CHEMOTHERAPY ALERT CARD or IMMUNOTHERAPY ALERT  CARD at check-in to the Emergency Department and triage nurse.  Should you have questions after your visit or need to cancel or reschedule your appointment, please contact [facility]  Dept: [phone]  and follow the prompts.  Office hours are 8:00 a.m. to 4:30 p.m. Monday - Friday. Please note that voicemails left after 4:00 p.m. may not be returned until the following business day.  We are closed weekends and major holidays. You have access to a nurse at all times for urgent questions. Please call the main number to the clinic Dept: [phone] and follow the prompts.   For any non-urgent questions, you may also contact your provider using MyChart. We now offer e-Visits for anyone 18 and older to request care online for non-urgent symptoms. For details visit [email or web address].   Also download the MyChart app! Go to the app store, search "MyChart", open the app, select [facility], and log in with your MyChart username and password.   

## 2023-03-14 ENCOUNTER — Ambulatory Visit: Payer: BC Managed Care – PPO

## 2023-03-20 ENCOUNTER — Other Ambulatory Visit: Payer: Self-pay | Admitting: Nurse Practitioner

## 2023-03-20 DIAGNOSIS — F17200 Nicotine dependence, unspecified, uncomplicated: Secondary | ICD-10-CM

## 2023-03-20 MED ORDER — NICOTINE 7 MG/24HR TD PT24
7.0000 mg | MEDICATED_PATCH | Freq: Every day | TRANSDERMAL | 0 refills | Status: DC
Start: 2023-03-20 — End: 2024-03-13

## 2023-03-21 MED FILL — Dexamethasone Sodium Phosphate Inj 100 MG/10ML: INTRAMUSCULAR | Qty: 1 | Status: AC

## 2023-03-21 NOTE — Progress Notes (Unsigned)
[facility]   Telephone:(336) [phone] Fax:([phone]   Clinic Follow up Note   Patient Care Team: Ardith Dark, MD as PCP - General (Family Medicine) Almond Lint, MD as Consulting Physician (General Surgery) Malachy Mood, MD as Consulting Physician (Hematology) Lonie Peak, MD as Attending Physician (Radiation Oncology) Pollyann Samples, NP as Nurse Practitioner (Nurse Practitioner)  Date of Service:  03/22/2023  CHIEF COMPLAINT: f/u of left breast cancer and Right      CURRENT THERAPY:  Zometa q3 months/   Paclitaxel D1,8,15 + Trastuzumabd D1 + Pertuzumab D1 q21d x 8 cycles /TrastuzumabD1 +Pertuzumab D1 q21 d x4 cycles started2/22/2024   ASSESSMENT:  Martha [name] is a 62 y.o. female with   Metastatic breast cancer to bone and brain -Diagnosed in 06/2022 --PET scan from 08/03/2022 showed diffuse bone mets  -she underwent cervical laminectomy and fusion by Dr. Maisie Fus on 08/17/2022, biopsy confirmed metastatic breast cancer, ER/PR negative, HER2 positive. -due to cervical cord compression with myelopathy, she underwent a second cervical spine surgery on September 06, 2022, and completed inpatient rehabitation. -She has completed brain, cervical and lumbar spine radiation in Feb 2024 -She was recently hospitalized for altered mental status and confusion, probably secondary to medication -She started first line systemic chemotherapy with weekly Taxol and trastuzumab/Perjeta on 10/25/22. She has tolerated well so far and her pain has improved overall -PET scan from Jan 01, 2023 showed complete metabolic response, no hypermetabolic uptake in diffuse bone lesions, no other new lesions.  She is clinically also doing much better.  Given her excellent response to chemo, we will change her Taxol to 2 weeks on and 1 week off -will continue her chemo as long as she can tolerating and no progression  -continue zometa every 3 months     Malignant neoplasm of lower-inner  quadrant of left breast in female, estrogen receptor positive ([name]) She was diagnosed in 12/2017. She is s/p left breast lumpectomy and adjuvant radiation.  -She started anti-estrogen therapy with letrozole on 05/2018. Tolerating well with no issues -lost f/u after visit in 04/2020 until her recurrence in 07/2022     Cancer related pain She was on low-dose oxycodone, she has developed a significant pain in the right rib cage, probably related to her bone metastasis. Pain much improved, off narcotics now  -f/u with palliative care clinic NP [facility]         PLAN: -lab reviewed - -CMP-pending -repeat BP in infusion if low, IV hydration will be added. -I ordered PET scan in 5 weeks -proceed with Kanjinti/Perjeta/Taxol today -lab /flush and treatment 8/1 -f/u in 3 weeks     SUMMARY OF ONCOLOGIC HISTORY: Oncology History Overview Note  Cancer Staging Malignant neoplasm of lower-inner quadrant of left breast in female, estrogen receptor positive ([name]) Staging form: Breast, AJCC 8th Edition - Clinical stage from 01/16/2018: Stage IA (cT1c, cN0, cM0, G2, ER+, PR+, HER2-) - Signed by Malachy Mood, MD on 01/23/2018 - Pathologic: Stage IA (pT1c, pN1, cM0, G1, ER+, PR+, HER2-) - Signed by Lonie Peak, MD on 04/09/2018     Malignant neoplasm of lower-inner quadrant of left breast in female, estrogen receptor positive ([name])  01/15/2018 Mammogram   Diagnositc Mammogram 01/15/18  IMPRESSION: 1. Suspicious 1.2 x 1.4 x 1.3 cm mixed echogenicity mass left breast 7 o'clock position retroareolar location at the site of palpable concern.. 2. Indeterminate Within the left breast 7:30 o'clock retroareolar location, adjacent to the palpable mass, is a 0.5 x 0.4  x 0.5 cm oval circumscribed hypoechoic mass. 3. Indeterminate calcifications within the lateral left breast. Location of these calcifications is not definitely confirmed on the true lateral view.    01/16/2018 Initial Biopsy   Diagnosis  01/16/18 1. Breast, left, needle core biopsy, 7:30 o'clock (ribbon clip) - FIBROCYSTIC CHANGES WITH SCLEROSING ADENOSIS AND CALCIFICATIONS. - FIBROADENOMATOID CHANGE. - NO MALIGNANCY IDENTIFIED. 2. Breast, left, needle core biopsy, 7 o'clock position (coil clip) - INVASIVE MAMMARY CARCINOMA, MSBR GRADE I/II. - SEE MICROSCOPIC DESCRIPTION Microscopic Comment  ADDENDUM: Immunohistochemistry for E-Cadherin is strongly positive in the tumor consistent with ductal carcinoma. (JDP:ah 01/17/18)   01/16/2018 Receptors her2   Estrogen Receptor: 100%, POSITIVE, STRONG STAINING INTENSITY Progesterone Receptor: 50%, POSITIVE, STRONG STAINING INTENSITY Proliferation Marker Ki67: 20% HER2 Negative   01/16/2018 Cancer Staging   Staging form: Breast, AJCC 8th Edition - Clinical stage from 01/16/2018: Stage IA (cT1c, cN0, cM0, G2, ER+, PR+, HER2-) - Signed by Malachy Mood, MD on 01/23/2018   01/22/2018 Initial Diagnosis   Malignant neoplasm of lower-inner quadrant of left breast in female, estrogen receptor positive ([name])   02/21/2018 Surgery    LEFT BREAST LUMPECTOMY WITH AXILLARY LYMPH NODE BIOPSY by Dr. Donell Beers  02/21/18   02/21/2018 Pathology Results   Diagnosis 02/21/18 1. Breast, lumpectomy, Left - INVASIVE DUCTAL CARCINOMA, GRADE I, 1.6 CM. - DUCTAL CARCINOMA IN SITU, INTERMEDIATE NUCLEAR GRADE. - ANTERIOR AND MEDIAL RESECTION MARGINS ARE POSITIVE FOR CARCINOMA. - NEGATIVE FOR LYMPHOVASCULAR OR PERINEURAL INVASION. - BACKGROUND BREAST TISSUE WITH FIBROCYSTIC CHANGE, INCLUDING SCLEROSING ADENOSIS. - BIOPSY SITE CHANGES. - SEE ONCOLOGY TABLE. 2. Lymph node, sentinel, biopsy, Left Axillary #1 - METASTATIC BREAST CARCINOMA TO A LYMPH NODE, 1.0 CM IN GREATEST DIMENSION, WITH EXTRANODAL EXTENSION (1/1). 3. Lymph node, sentinel, biopsy, Left Axillary #2 - LYMPH NODE, NEGATIVE FOR CARCINOMA (0/1).    02/21/2018 Miscellaneous   Mammaprint 02/21/18 Low Risk with 10-year risk of recurrnce at 10% -No  potential signifcant chemotherapy benefit   03/20/2018 Pathology Results   RE-EXCISION OF BREAST LUMPECTOMY by Dr. Donell Beers  Diagnosis 03/20/18 1. Breast, excision, Left new anterior margin - FIBROCYSTIC CHANGES WITH ADENOSIS AND CALCIFICATIONS. - HEALING BIOPSY SITE. - THERE IS NO EVIDENCE OF MALIGNANCY. 2. Breast, excision, Left new medial margin - FIBROCYSTIC CHANGES WITH ADENOSIS AND CALCIFICATIONS. - HEALING BIOPSY SITE. - THERE IS NO EVIDENCE OF MALIGNANCY. Microscopic Comment 1. -2. The surgical resection margin(s) of the specimen were inked and microscopically evaluated. (JBK:kh 03-22-18)   04/09/2018 Cancer Staging   Staging form: Breast, AJCC 8th Edition - Pathologic: Stage IA (pT1c, pN1, cM0, G1, ER+, PR+, HER2-) - Signed by Lonie Peak, MD on 04/09/2018   04/22/2018 - 06/03/2018 Radiation Therapy   Radaiton with Dr. Basilio Cairo 04/22/18-06/03/18   05/2018 -  Anti-estrogen oral therapy   Letrozole 2.5mg  started 05/2018    Survivorship   Per Santiago Glad, NP    05/04/2022 Imaging    IMPRESSION: Cervical spondylosis, as described.   Nonspecific straightening of the expected cervical lordosis.   07/24/2022 Imaging    IMPRESSION: 1. Extensive osseous metastatic disease with pathologic fracture at the base of dens and C2 right lateral mass. Extraosseous tumor at C1 and C2 likely impinging on the right C2 and C3 nerve roots. 2. Degenerative cord impingement at C4-5 to C6-7. Biforaminal impingement at C5-6 and C6-7.   08/03/2022 PET scan    IMPRESSION: 1. Large volume osseous metastasis. 2. Low right cervical and probable right axillary nodal metastasis. 3. Subtle heterogeneous activity throughout the liver  with suggestion of small liver lesions (likely new compared to chest CT of 07/16/2020). Findings are overall moderately suspicious for hepatic metastasis. Pre and post contrast abdominal MRI (preferred) or CT could confirm. 4. Right-sided pleural thickening and trace  pleural fluid. Right base airspace disease is favored to represent chronic atelectasis. 5. Aortic atherosclerosis (ICD10-I70.0) and emphysema (ICD10-J43.9).     08/11/2022 Genetic Testing   Negative genetic testing on the CancerNext-Expanded+RNAinsight panel.  The report date is August 11, 2022.  The CancerNext-Expanded gene panel offered by [facility] and includes sequencing and rearrangement analysis for the following 77 genes: AIP, ALK, APC*, ATM*, AXIN2, BAP1, BARD1, BLM, BMPR1A, BRCA1*, BRCA2*, BRIP1*, CDC73, CDH1*, CDK4, CDKN1B, CDKN2A, CHEK2*, CTNNA1, DICER1, FANCC, FH, FLCN, GALNT12, KIF1B, LZTR1, MAX, MEN1, MET, MLH1*, MSH2*, MSH3, MSH6*, MUTYH*, NBN, NF1*, NF2, NTHL1, PALB2*, PHOX2B, PMS2*, POT1, PRKAR1A, PTCH1, PTEN*, RAD51C*, RAD51D*, RB1, RECQL, RET, SDHA, SDHAF2, SDHB, SDHC, SDHD, SMAD4, SMARCA4, SMARCB1, SMARCE1, STK11, SUFU, TMEM127, TP53*, TSC1, TSC2, VHL and XRCC2 (sequencing and deletion/duplication); EGFR, EGLN1, HOXB13, KIT, MITF, PDGFRA, POLD1, and POLE (sequencing only); EPCAM and GREM1 (deletion/duplication only). DNA and RNA analyses performed for * genes.    Lobular carcinoma in situ (LCIS) of right breast  01/20/2020 Mammogram   Diagnostic Mammogram 01/20/20 IMPRESSION: 1.  Stable post lumpectomy changes of the left breast.   2. Suspicious microcalcifications over the right upper outer quadrant spanning 3.6 cm.   02/02/2020 Initial Biopsy   Diagnosis 02/02/20 Breast, right, needle core biopsy, upper outer quadrant, x clip - LOBULAR CARCINOMA IN SITU WITH PLEOMORPHIC FEATURES AND CALCIFICATIONS, INVOLVING ADENOSIS. SEE NOTE Diagnosis Note Immunohistochemical stain for E-cadherin is negative in the lesional cells, consistent with a lobular phenotype. Immunostains for p63, SMM 1 and calponin do not show evidence of invasive carcinoma.    02/04/2020 Initial Diagnosis   Lobular carcinoma in situ (LCIS) of right breast   03/18/2020 Surgery   RIGHT BREAST LUMPECTOMY  WITH RADIOACTIVE SEED LOCALIZATION by Dr Alvira Monday    03/18/2020 Pathology Results   FINAL MICROSCOPIC DIAGNOSIS:   A. BREAST, RIGHT, LUMPECTOMY:  - Pleomorphic lobular carcinoma in situ with calcifications and  underlying complex sclerosing lesion, adenosis and fibroadenomatoid  change.  - Margins of resection are not involved (Closest margins: < 1 mm,  anterior, posterior, inferior and medial).  - Biopsy site.    COMMENT:   P63, Calponin and SMM-1 demonstrate the presence of myoepithelium in the  select focus.    Metastatic breast cancer to bone and brain  05/04/2022 Imaging    IMPRESSION: Cervical spondylosis, as described.   Nonspecific straightening of the expected cervical lordosis.   07/24/2022 Imaging    IMPRESSION: 1. Extensive osseous metastatic disease with pathologic fracture at the base of dens and C2 right lateral mass. Extraosseous tumor at C1 and C2 likely impinging on the right C2 and C3 nerve roots. 2. Degenerative cord impingement at C4-5 to C6-7. Biforaminal impingement at C5-6 and C6-7.   08/02/2022 Initial Diagnosis   Cancer, metastatic to bone ([name])   08/03/2022 PET scan    IMPRESSION: 1. Large volume osseous metastasis. 2. Low right cervical and probable right axillary nodal metastasis. 3. Subtle heterogeneous activity throughout the liver with suggestion of small liver lesions (likely new compared to chest CT of 07/16/2020). Findings are overall moderately suspicious for hepatic metastasis. Pre and post contrast abdominal MRI (preferred) or CT could confirm. 4. Right-sided pleural thickening and trace pleural fluid. Right base airspace disease is favored to represent chronic atelectasis. 5.  Aortic atherosclerosis (ICD10-I70.0) and emphysema (ICD10-J43.9).     10/26/2022 -  Chemotherapy   Patient is on Treatment Plan : BREAST Paclitaxel D1,8,15 + Trastuzumab D1 + Pertuzumab D1 q21d x 8 cycles / Trastuzumab D1 + Pertuzumab D1 q21d x 4 cycles         INTERVAL HISTORY:  Martha [name] is here for a follow up of left breast cancer and Right . She was last seen by PA-C Georga Kaufmann on 02/27/2023 She presents to the clinic accompanied by husband. Pt stated that she is doing well. She took a trip to the mountains, and was able to walk up and down 14 steps. Pt report that she uses a walker to get around her home. She is able to shower and take care of herself with assistants. Pt appetite is good.- Pt has some numbness and tingling in her finger tips, but not her feet.    All other systems were reviewed with the patient and are negative.  MEDICAL HISTORY:  Past Medical History:  Diagnosis Date   Anxiety    Cancer ([name]) 2022   right breast LCIS   Cancer ([name]) 2019   left breast   Depression    Dysrhythmia    SVT, s/p ablation ~ 2012 in at [facility]   Ectopic pregnancy    Family history of breast cancer    Family history of melanoma    History of radiation therapy 04/22/18-06/03/18   Left Breast, left SCV, axilla 50 Gy in 25 fractions, Left breast boost 10 Gy in 5 fractions.    Hypothyroidism    Personal history of radiation therapy    PONV (postoperative nausea and vomiting)    Thyroid disease     SURGICAL HISTORY: Past Surgical History:  Procedure Laterality Date   APPENDECTOMY     BILATERAL SALPINGECTOMY     BREAST BIOPSY Right 2014   fibroadenoma   BREAST BIOPSY Left 01/16/2018   BREAST BIOPSY Right 02/02/2020   LCIS   BREAST BIOPSY Right 08/04/2020   x2 LCIS   BREAST BIOPSY Right 08/13/2020   x2   BREAST EXCISIONAL BIOPSY Left 1990   benign   BREAST EXCISIONAL BIOPSY Right 02/02/2020   LCIS   BREAST LUMPECTOMY Left 01/22/2018   BREAST LUMPECTOMY WITH AXILLARY LYMPH NODE BIOPSY Left 02/21/2018   Procedure: LEFT BREAST LUMPECTOMY WITH AXILLARY LYMPH NODE BIOPSY;  Surgeon: Almond Lint, MD;  Location: [facility] OR;  Service: General;  Laterality: Left;   BREAST LUMPECTOMY WITH RADIOACTIVE SEED  LOCALIZATION Right 03/18/2020   Procedure: RIGHT BREAST LUMPECTOMY WITH RADIOACTIVE SEED LOCALIZATION;  Surgeon: Almond Lint, MD;  Location: [facility] OR;  Service: General;  Laterality: Right;   BREAST SURGERY Left 1993   cyst removed   ENDOMETRIAL ABLATION     EYE SURGERY     GLAUCOMA SURGERY Bilateral    IR IMAGING GUIDED PORT INSERTION  10/16/2022   POSTERIOR CERVICAL FUSION/FORAMINOTOMY N/A 08/17/2022   Procedure: Cervical One-Cervical Four POSTERIOR CERVICAL FUSION,  Cervical One LAMINECTOMY REDUCTION OF Cervical Two Fracture, Biopsy of Right Cerical Two Pars  Lesion;  Surgeon: Bedelia Person, MD;  Location: [facility] OR;  Service: Neurosurgery;  Laterality: N/A;   POSTERIOR CERVICAL FUSION/FORAMINOTOMY N/A 09/06/2022   Procedure: Posterior Cervical Fusion, Foraminotomy , Cervical Five-Six, Cervical Six-Seven; Extension of  fusion Cervical Four- Thoracic One;  Surgeon: Bedelia Person, MD;  Location: [facility] OR;  Service: Neurosurgery;  Laterality: N/A;   RADIOACTIVE SEED GUIDED EXCISIONAL BREAST BIOPSY Right 04/28/2021  Procedure: RADIOACTIVE SEED GUIDED EXCISIONAL RIGHT BREAST BIOPSY X2;  Surgeon: Almond Lint, MD;  Location: [facility];  Service: General;  Laterality: Right;   RE-EXCISION OF BREAST LUMPECTOMY Left 03/20/2018   Procedure: RE-EXCISION OF BREAST LUMPECTOMY;  Surgeon: Almond Lint, MD;  Location:  SURGERY CENTER;  Service: General;  Laterality: Left;    I have reviewed the social history and family history with the patient and they are unchanged from previous note.  ALLERGIES:  is allergic to morphine and codeine.  MEDICATIONS:  Current Outpatient Medications  Medication Sig Dispense Refill   acetaminophen (TYLENOL) 500 MG tablet Take 2 tablets (1,000 mg total) by mouth every 8 (eight) hours. 30 tablet 0   citalopram (CELEXA) 40 MG tablet Take 1 tablet (40 mg total) by mouth daily. 30 tablet 3   diphenoxylate-atropine (LOMOTIL) 2.5-0.025 MG tablet Take  1 tablet by mouth 4 (four) times daily as needed for diarrhea or loose stools. 60 tablet 0   levothyroxine (SYNTHROID) 75 MCG tablet Take 1 tablet (75 mcg total) by mouth daily before breakfast. 30 tablet 0   lidocaine (XYLOCAINE) 2 % solution Patient: Mix 1part 2% viscous lidocaine, 1part H20. Swallow 10mL of diluted mixture, before meals and at bedtime, up to QID 100 mL 3   lidocaine-prilocaine (EMLA) cream Apply to affected area once (Patient taking differently: Apply 1 Application topically daily as needed (burning).) 30 g 3   Loperamide HCl (IMODIUM PO) Take by mouth as needed.     LORazepam (ATIVAN) 0.5 MG tablet Take 1 tablet (0.5 mg total) by mouth every 6 (six) hours as needed for anxiety (agitation). 30 tablet 0   Mouthwashes (MOUTH RINSE) LIQD solution 15 mLs by Mouth Rinse route as needed (for oral care).  0   nicotine (NICODERM CQ - DOSED IN MG/24 HR) 7 mg/24hr patch Place 1 patch (7 mg total) onto the skin daily. 28 patch 0   ondansetron (ZOFRAN) 8 MG tablet Take 1 tablet (8 mg total) by mouth every 8 (eight) hours as needed for nausea or vomiting. 20 tablet 1   OVER THE COUNTER MEDICATION Take 1 tablet by mouth daily. Protandim Supplement     oxyCODONE (OXY IR/ROXICODONE) 5 MG immediate release tablet Take 0.5-1 tablets (2.5-5 mg total) by mouth every 4 (four) hours as needed for moderate pain or severe pain (dyspnea). 30 tablet 0   pregabalin (LYRICA) 25 MG capsule Take 1 capsule (25 mg total) by mouth 2 (two) times daily. 60 capsule 2   senna-docusate (SENOKOT-S) 8.6-50 MG tablet Take 1 tablet by mouth 2 (two) times daily. 60 tablet 0   No current facility-administered medications for this visit.   Facility-Administered Medications Ordered in Other Visits  Medication Dose Route Frequency Provider Last Rate Last Admin   0.9 %  sodium chloride infusion   Intravenous Continuous Malachy Mood, MD 500 mL/hr at 03/22/23 0842 New Bag at 03/22/23 0842   dexamethasone (DECADRON) 10 mg in  sodium chloride 0.9 % 50 mL IVPB  10 mg Intravenous Once Malachy Mood, MD 204 mL/hr at 03/22/23 0906 10 mg at 03/22/23 0906   heparin lock flush 100 unit/mL  500 Units Intracatheter Once PRN Malachy Mood, MD       PACLitaxel (TAXOL) 102 mg in sodium chloride 0.9 % 250 mL chemo infusion (</= 80mg /m2)  80 mg/m2 (Order-Specific) Intravenous Once Malachy Mood, MD       pertuzumab (PERJETA) 420 mg in sodium chloride 0.9 % 250 mL chemo infusion  420 mg Intravenous Once Malachy Mood, MD       sodium chloride flush (NS) 0.9 % injection 10 mL  10 mL Intracatheter PRN Malachy Mood, MD       trastuzumab-anns [facility]) 210 mg in sodium chloride 0.9 % 250 mL chemo infusion  6 mg/kg (Treatment Plan Recorded) Intravenous Once Malachy Mood, MD        PHYSICAL EXAMINATION: ECOG PERFORMANCE STATUS: 2 - Symptomatic, <50% confined to bed  Vitals:   03/22/23 0806  BP: (!) 87/67  Pulse: 84  Resp: 16  Temp: 98.3 F (36.8 C)  SpO2: 99%   Wt Readings from Last 3 Encounters:  03/22/23 81 lb 4.8 oz (36.9 kg)  03/08/23 79 lb 8 oz (36.1 kg)  02/27/23 78 lb 6.4 oz (35.6 kg)     GENERAL:alert, no distress and comfortable SKIN: skin color normal, no rashes or significant lesions EYES: normal, Conjunctiva are pink and non-injected, sclera clear  NEURO: alert & oriented x 3 with fluent speech LUNGS: (-) clear to auscultation and percussion with normal breathing effort HEART:(-) regular rate & rhythm and no murmurs and (-) no lower extremity edema  LABORATORY DATA:  I have reviewed the data as listed    Latest Ref Rng & Units 03/22/2023    7:39 AM 03/08/2023    7:35 AM 02/27/2023   10:47 AM  CBC  WBC 4.0 - 10.5 K/uL 5.0  4.2  3.5   Hemoglobin 12.0 - 15.0 g/dL 78.2  95.6  21.3   Hematocrit 36.0 - 46.0 % 37.5  37.2  37.5   Platelets 150 - 400 K/uL 233  221  264         Latest Ref Rng & Units 03/22/2023    7:39 AM 03/08/2023    7:35 AM 02/27/2023   10:47 AM  CMP  Glucose 70 - 99 mg/dL 90  086  578   BUN 8 - 23 mg/dL 5  10   8    Creatinine 0.44 - 1.00 mg/dL 4.69  <6.29  5.28   Sodium 135 - 145 mmol/L 140  139  138   Potassium 3.5 - 5.1 mmol/L 3.5  3.9  4.3   Chloride 98 - 111 mmol/L 104  105  106   CO2 22 - 32 mmol/L 30  30  29    Calcium 8.9 - 10.3 mg/dL 8.8  9.1  8.6   Total Protein 6.5 - 8.1 g/dL 5.5  5.9  5.6   Total Bilirubin 0.3 - 1.2 mg/dL 0.5  0.4  0.5   Alkaline Phos 38 - 126 U/L 96  117  142   AST 15 - 41 U/L 13  28  38   ALT 0 - 44 U/L 12  31  45       RADIOGRAPHIC STUDIES: I have personally reviewed the radiological images as listed and agreed with the findings in the report. No results found.    Orders Placed This Encounter  Procedures   NM PET Image Restag (PS) Skull Base To Thigh    Standing Status:   Future    Standing Expiration Date:   03/21/2024    Order Specific Question:   If indicated for the ordered procedure, I authorize the administration of a radiopharmaceutical per Radiology protocol    Answer:   Yes    Order Specific Question:   Preferred imaging location?    Answer:   [facility]    Order Specific Question:   Release to patient  Answer:   Immediate   All questions were answered. The patient knows to call the clinic with any problems, questions or concerns. No barriers to learning was detected. The total time spent in the appointment was 25 minutes.     Malachy Mood, MD 03/22/2023   Carolin Coy, CMA, am acting as scribe for Malachy Mood, MD.   I have reviewed the above documentation for accuracy and completeness, and I agree with the above.

## 2023-03-21 NOTE — Assessment & Plan Note (Signed)
--  She was diagnosed in 12/2017. She is s/p left breast lumpectomy and adjuvant radiation.  -She started anti-estrogen therapy with letrozole on 05/2018. Tolerating well with no issues -lost f/u after visit in 04/2020 until her recurrence in 07/2022

## 2023-03-21 NOTE — Assessment & Plan Note (Addendum)
-  Diagnosed in 06/2022 --PET scan from 08/03/2022 showed diffuse bone mets  -she underwent cervical laminectomy and fusion by Dr. Maisie Fus on 08/17/2022, biopsy confirmed metastatic breast cancer, ER/PR negative, HER2 positive. -due to cervical cord compression with myelopathy, she underwent a second cervical spine surgery on September 06, 2022, and completed inpatient rehabitation. -She has completed brain, cervical and lumbar spine radiation in Feb 2024 -She was recently hospitalized for altered mental status and confusion, probably secondary to medication -She started first line systemic chemotherapy with weekly Taxol and trastuzumab/Perjeta on 10/25/22. She has tolerated well so far and her pain has improved overall -PET scan from Jan 01, 2023 showed complete metabolic response, no hypermetabolic uptake in diffuse bone lesions, no other new lesions.  She is clinically also doing much better.  Given her excellent response to chemo, we will change her Taxol to 2 weeks on and 1 week off -will continue her chemo as long as she can tolerating and no progression  -continue zometa every 3 months

## 2023-03-21 NOTE — Assessment & Plan Note (Signed)
-  She is on low-dose oxycodone, she has developed a significant pain in the right rib cage, probably related to her bone metastasis  -f/u with palliative care clinic NP Nikki  

## 2023-03-22 ENCOUNTER — Inpatient Hospital Stay: Payer: BC Managed Care – PPO | Admitting: Dietician

## 2023-03-22 ENCOUNTER — Encounter: Payer: Self-pay | Admitting: Hematology

## 2023-03-22 ENCOUNTER — Inpatient Hospital Stay (HOSPITAL_BASED_OUTPATIENT_CLINIC_OR_DEPARTMENT_OTHER): Payer: BC Managed Care – PPO | Admitting: Hematology

## 2023-03-22 ENCOUNTER — Inpatient Hospital Stay: Payer: BC Managed Care – PPO

## 2023-03-22 ENCOUNTER — Other Ambulatory Visit: Payer: Self-pay

## 2023-03-22 VITALS — BP 87/67 | HR 84 | Temp 98.3°F | Resp 16 | Ht 66.0 in | Wt 81.3 lb

## 2023-03-22 VITALS — BP 109/72 | HR 82 | Resp 16

## 2023-03-22 DIAGNOSIS — C7951 Secondary malignant neoplasm of bone: Secondary | ICD-10-CM

## 2023-03-22 DIAGNOSIS — G893 Neoplasm related pain (acute) (chronic): Secondary | ICD-10-CM | POA: Diagnosis not present

## 2023-03-22 DIAGNOSIS — Z5112 Encounter for antineoplastic immunotherapy: Secondary | ICD-10-CM | POA: Diagnosis not present

## 2023-03-22 DIAGNOSIS — Z79899 Other long term (current) drug therapy: Secondary | ICD-10-CM | POA: Diagnosis not present

## 2023-03-22 DIAGNOSIS — C50312 Malignant neoplasm of lower-inner quadrant of left female breast: Secondary | ICD-10-CM

## 2023-03-22 DIAGNOSIS — Z95828 Presence of other vascular implants and grafts: Secondary | ICD-10-CM

## 2023-03-22 DIAGNOSIS — Z170 Estrogen receptor positive status [ER+]: Secondary | ICD-10-CM | POA: Diagnosis not present

## 2023-03-22 DIAGNOSIS — C50911 Malignant neoplasm of unspecified site of right female breast: Secondary | ICD-10-CM | POA: Diagnosis not present

## 2023-03-22 DIAGNOSIS — C7931 Secondary malignant neoplasm of brain: Secondary | ICD-10-CM | POA: Diagnosis not present

## 2023-03-22 DIAGNOSIS — Z171 Estrogen receptor negative status [ER-]: Secondary | ICD-10-CM | POA: Diagnosis not present

## 2023-03-22 LAB — CMP (CANCER CENTER ONLY)
ALT: 12 U/L (ref 0–44)
AST: 13 U/L — ABNORMAL LOW (ref 15–41)
Albumin: 3.8 g/dL (ref 3.5–5.0)
Alkaline Phosphatase: 96 U/L (ref 38–126)
Anion gap: 6 (ref 5–15)
BUN: 5 mg/dL — ABNORMAL LOW (ref 8–23)
CO2: 30 mmol/L (ref 22–32)
Calcium: 8.8 mg/dL — ABNORMAL LOW (ref 8.9–10.3)
Chloride: 104 mmol/L (ref 98–111)
Creatinine: 0.33 mg/dL — ABNORMAL LOW (ref 0.44–1.00)
GFR, Estimated: >60 mL/min (ref >60)
Glucose, Bld: 90 mg/dL (ref 70–99)
Potassium: 3.5 mmol/L (ref 3.5–5.1)
Sodium: 140 mmol/L (ref 135–145)
Total Bilirubin: 0.5 mg/dL (ref 0.3–1.2)
Total Protein: 5.5 g/dL — ABNORMAL LOW (ref 6.5–8.1)

## 2023-03-22 LAB — CBC WITH DIFFERENTIAL (CANCER CENTER ONLY)
Abs Immature Granulocytes: 0.02 10*3/uL (ref 0.00–0.07)
Basophils Absolute: 0 10*3/uL (ref 0.0–0.1)
Basophils Relative: 1 %
Eosinophils Absolute: 0.1 10*3/uL (ref 0.0–0.5)
Eosinophils Relative: 1 %
HCT: 37.5 % (ref 36.0–46.0)
Hemoglobin: 12.4 g/dL (ref 12.0–15.0)
Immature Granulocytes: 0 %
Lymphocytes Relative: 14 %
Lymphs Abs: 0.7 10*3/uL (ref 0.7–4.0)
MCH: 32.2 pg (ref 26.0–34.0)
MCHC: 33.1 g/dL (ref 30.0–36.0)
MCV: 97.4 fL (ref 80.0–100.0)
Monocytes Absolute: 0.6 10*3/uL (ref 0.1–1.0)
Monocytes Relative: 11 %
Neutro Abs: 3.6 10*3/uL (ref 1.7–7.7)
Neutrophils Relative %: 73 %
Platelet Count: 233 10*3/uL (ref 150–400)
RBC: 3.85 MIL/uL — ABNORMAL LOW (ref 3.87–5.11)
RDW: 14.5 % (ref 11.5–15.5)
WBC Count: 5 10*3/uL (ref 4.0–10.5)
nRBC: 0 % (ref 0.0–0.2)

## 2023-03-22 MED ORDER — SODIUM CHLORIDE 0.9 % IV SOLN
80.0000 mg/m2 | Freq: Once | INTRAVENOUS | Status: AC
  Administered 2023-03-22: 102 mg via INTRAVENOUS
  Filled 2023-03-22: qty 17

## 2023-03-22 MED ORDER — LOPERAMIDE HCL 2 MG PO CAPS
2.0000 mg | ORAL_CAPSULE | ORAL | Status: DC | PRN
  Administered 2023-03-22: 2 mg via ORAL
  Filled 2023-03-22: qty 1

## 2023-03-22 MED ORDER — SODIUM CHLORIDE 0.9 % IV SOLN
420.0000 mg | Freq: Once | INTRAVENOUS | Status: AC
  Administered 2023-03-22: 420 mg via INTRAVENOUS
  Filled 2023-03-22: qty 14

## 2023-03-22 MED ORDER — HEPARIN SOD (PORK) LOCK FLUSH 100 UNIT/ML IV SOLN
500.0000 [IU] | Freq: Once | INTRAVENOUS | Status: AC | PRN
  Administered 2023-03-22: 500 [IU]

## 2023-03-22 MED ORDER — SODIUM CHLORIDE 0.9 % IV SOLN: INTRAVENOUS | Status: AC

## 2023-03-22 MED ORDER — SODIUM CHLORIDE 0.9% FLUSH
10.0000 mL | Freq: Once | INTRAVENOUS | Status: AC
  Administered 2023-03-22: 10 mL

## 2023-03-22 MED ORDER — FAMOTIDINE IN NACL 20-0.9 MG/50ML-% IV SOLN
20.0000 mg | Freq: Once | INTRAVENOUS | Status: AC
  Administered 2023-03-22: 20 mg via INTRAVENOUS
  Filled 2023-03-22: qty 50

## 2023-03-22 MED ORDER — SODIUM CHLORIDE 0.9 % IV SOLN: Freq: Once | INTRAVENOUS | Status: AC

## 2023-03-22 MED ORDER — ACETAMINOPHEN 325 MG PO TABS
650.0000 mg | ORAL_TABLET | Freq: Once | ORAL | Status: AC
  Administered 2023-03-22: 650 mg via ORAL
  Filled 2023-03-22: qty 2

## 2023-03-22 MED ORDER — SODIUM CHLORIDE 0.9% FLUSH
10.0000 mL | INTRAVENOUS | Status: DC | PRN
  Administered 2023-03-22: 10 mL

## 2023-03-22 MED ORDER — TRASTUZUMAB-ANNS CHEMO 150 MG IV SOLR
6.0000 mg/kg | Freq: Once | INTRAVENOUS | Status: AC
  Administered 2023-03-22: 210 mg via INTRAVENOUS
  Filled 2023-03-22: qty 10

## 2023-03-22 MED ORDER — DIPHENHYDRAMINE HCL 50 MG/ML IJ SOLN
25.0000 mg | Freq: Once | INTRAMUSCULAR | Status: AC
  Administered 2023-03-22: 25 mg via INTRAVENOUS
  Filled 2023-03-22: qty 1

## 2023-03-22 MED ORDER — SODIUM CHLORIDE 0.9 % IV SOLN
10.0000 mg | Freq: Once | INTRAVENOUS | Status: AC
  Administered 2023-03-22: 10 mg via INTRAVENOUS
  Filled 2023-03-22: qty 10

## 2023-03-22 NOTE — Progress Notes (Signed)
Nutrition Follow-up:  Patient with metastatic breast cancer. She is receiving paclitaxel/perjeta/kanjinti q21d.   Met with patient and husband in infusion. Patient reports doing well. Her appetite is "great." Patient recently enjoyed a mountain trip. Reports walking up/down 17 several times during their stay. Her legs are still sore from this. She denies nutrition impact symptoms at this time.    Medications: reviewed   Labs: BUN 5, Cr 0.33  Anthropometrics: Wt 81 lb 4.8 oz today - trending up  6/13- 79 lb 1 oz 5/16 - 75 lb 14.4 oz   NUTRITION DIAGNOSIS: Inadequate oral intake improving    INTERVENTION:  Continue eating high calorie high protein foods to promote wt gain Continue 2 Ensure Plus HP  Encouraged high protein snack after exercise     MONITORING, EVALUATION, GOAL: weight trends, intake   NEXT VISIT: To be scheduled as needed with treatment

## 2023-03-26 ENCOUNTER — Ambulatory Visit (HOSPITAL_COMMUNITY): Payer: BC Managed Care – PPO

## 2023-03-26 ENCOUNTER — Encounter (HOSPITAL_COMMUNITY): Payer: Self-pay

## 2023-03-28 MED FILL — Dexamethasone Sodium Phosphate Inj 100 MG/10ML: INTRAMUSCULAR | Qty: 1 | Status: AC

## 2023-03-28 NOTE — Progress Notes (Signed)
Martha [name] presents today for follow-up after completing SRS treatment to her brain on 10/11/2022. She has an MRI scheduled for 03-26-23.   MR Brain W Wo Contrast 03/31/2023  IMPRESSION: 1. Innumerable subcentimeter new brain metastases, most punctate and less than 2 mm in size, including profound involvement in the bilateral basal ganglia. 2. No associated cerebral edema or mass effect at this time. 3. Study discussed by telephone with Dr. Maralyn Sago SQUIRE on 04/09/2023 at 09:15.  Recent neurologic symptoms, if any:  Seizures: {:18581} Headaches: {:18581} Nausea: {:18581} Dizziness/ataxia: {:18581} Difficulty with hand coordination: {:18581} Focal numbness/weakness: {:18581} Visual deficits/changes: {:18581} Confusion/Memory deficits: {:18581}  Other issues of note:

## 2023-03-28 NOTE — Progress Notes (Unsigned)
Palliative Medicine [facility]  Telephone:(336) [phone] Fax:(336) [phone]   Name: Nicaela Treesh Date: 03/28/2023 MRN: [id number]  DOB: 05/27/1961  Patient Care Team: Ardith Dark, MD as PCP - General (Family Medicine) Almond Lint, MD as Consulting Physician (General Surgery) Malachy Mood, MD as Consulting Physician (Hematology) Lonie Peak, MD as Attending Physician (Radiation Oncology) Pollyann Samples, NP as Nurse Practitioner (Nurse Practitioner)    INTERVAL HISTORY: Dashanay Pitkin is a 62 y.o. female with oncologic medical history including ER+ breast cancer (01/2018), right breast cancer (04/2021), now with metastatic disease progression involving brain and liver, pathological fractures with osseous mets s/p brain radiation. .  Palliative ask to see for symptom management and goals of care.   SOCIAL HISTORY:    Mrs. Canda reports that she has been smoking cigarettes. She has a 20 pack-year smoking history. She has never used smokeless tobacco. She reports that she does not currently use alcohol after a past usage of about 28.0 standard drinks of alcohol per week. She reports that she does not use drugs.  ADVANCE DIRECTIVES:  Has documents on file  CODE STATUS: DNR  PAST MEDICAL HISTORY: Past Medical History:  Diagnosis Date   Anxiety    Cancer ([name]) 2022   right breast LCIS   Cancer ([name]) 2019   left breast   Depression    Dysrhythmia    SVT, s/p ablation ~ 2012 in at [facility]   Ectopic pregnancy    Family history of breast cancer    Family history of melanoma    History of radiation therapy 04/22/18-06/03/18   Left Breast, left SCV, axilla 50 Gy in 25 fractions, Left breast boost 10 Gy in 5 fractions.    Hypothyroidism    Personal history of radiation therapy    PONV (postoperative nausea and vomiting)    Thyroid disease     ALLERGIES:  is allergic to morphine and codeine.  MEDICATIONS:  Current Outpatient  Medications  Medication Sig Dispense Refill   acetaminophen (TYLENOL) 500 MG tablet Take 2 tablets (1,000 mg total) by mouth every 8 (eight) hours. 30 tablet 0   citalopram (CELEXA) 40 MG tablet Take 1 tablet (40 mg total) by mouth daily. 30 tablet 3   diphenoxylate-atropine (LOMOTIL) 2.5-0.025 MG tablet Take 1 tablet by mouth 4 (four) times daily as needed for diarrhea or loose stools. 60 tablet 0   levothyroxine (SYNTHROID) 75 MCG tablet Take 1 tablet (75 mcg total) by mouth daily before breakfast. 30 tablet 0   lidocaine (XYLOCAINE) 2 % solution Patient: Mix 1part 2% viscous lidocaine, 1part H20. Swallow 10mL of diluted mixture, before meals and at bedtime, up to QID 100 mL 3   lidocaine-prilocaine (EMLA) cream Apply to affected area once (Patient taking differently: Apply 1 Application topically daily as needed (burning).) 30 g 3   Loperamide HCl (IMODIUM PO) Take by mouth as needed.     LORazepam (ATIVAN) 0.5 MG tablet Take 1 tablet (0.5 mg total) by mouth every 6 (six) hours as needed for anxiety (agitation). 30 tablet 0   Mouthwashes (MOUTH RINSE) LIQD solution 15 mLs by Mouth Rinse route as needed (for oral care).  0   nicotine (NICODERM CQ - DOSED IN MG/24 HR) 7 mg/24hr patch Place 1 patch (7 mg total) onto the skin daily. 28 patch 0   ondansetron (ZOFRAN) 8 MG tablet Take 1 tablet (8 mg total) by mouth every 8 (eight) hours as needed  for nausea or vomiting. 20 tablet 1   OVER THE COUNTER MEDICATION Take 1 tablet by mouth daily. Protandim Supplement     oxyCODONE (OXY IR/ROXICODONE) 5 MG immediate release tablet Take 0.5-1 tablets (2.5-5 mg total) by mouth every 4 (four) hours as needed for moderate pain or severe pain (dyspnea). 30 tablet 0   pregabalin (LYRICA) 25 MG capsule Take 1 capsule (25 mg total) by mouth 2 (two) times daily. 60 capsule 2   senna-docusate (SENOKOT-S) 8.6-50 MG tablet Take 1 tablet by mouth 2 (two) times daily. 60 tablet 0   No current facility-administered  medications for this visit.    VITAL SIGNS: There were no vitals taken for this visit. There were no vitals filed for this visit.  Estimated body mass index is 13.12 kg/m as calculated from the following:   Height as of 03/22/23: 5\' 6"  (1.676 m).   Weight as of 03/22/23: 81 lb 4.8 oz (36.9 kg).   PERFORMANCE STATUS (ECOG) : 3 - Symptomatic, >50% confined to bed   Physical Exam General: NAD, thin   Cardiovascular: regular rate and rhythm Pulmonary: normal breathing pattern Abdomen: soft, nontender, + bowel sounds Extremities: no edema, no joint deformities Skin: no rashes, muscle wasting  Neurological: alert, oriented x 4  IMPRESSION: I saw Mrs. Crooks during her infusion. Resting comfortably in recliner. Her husband is present. No acute distress. Shares she continues to do well overall. Her hair is starting to grow back. Denies nausea, vomiting, constipation, or diarrhea. Some occasional fatigue but feels her energy level has improved over the past months which she is appreciative of.   Decreased appetite/weight loss Appetite is much improved. Current weight stable at 79lbs. She is enjoying eating foods that she enjoys. Drinking daily protein shakes.    Pain Mrs. Boyers shares pain is minimal. Occasional aches and pains. Lyrica as prescribed. Tylenol as needed. Describes pain in sacral and back area. Some stiffness and tenderness to neck area.   No symptom management needs at this time. Will continue to support as needed.   PLAN: Continue intake of Ensure and soft, high-calorie foods. Continue Lyrica 25 mg twice daily. Tylenol as needed for mild aches and pain.  Protective skin barrier to sacral area. Ongoing goals of care discussions and symptom management Palliative will plan to see patient back in 2-4 weeks in collaboration to other oncology appointments.  Patient and husband knows to contact office sooner if needed.   Patient expressed understanding and was in agreement  with this plan. She also understands that She can call the clinic at any time with any questions, concerns, or complaints.      Any controlled substances utilized were prescribed in the context of palliative care. PDMP has been reviewed.   Visit consisted of counseling and education dealing with the complex and emotionally intense issues of symptom management and palliative care in the setting of serious and potentially life-threatening illness.Greater than 50%  of this time was spent counseling and coordinating care related to the above assessment and plan.  Willette Alma, AGPCNP-BC  Palliative Medicine Team/[facility]

## 2023-03-29 ENCOUNTER — Encounter: Payer: Self-pay | Admitting: Nurse Practitioner

## 2023-03-29 ENCOUNTER — Inpatient Hospital Stay (HOSPITAL_BASED_OUTPATIENT_CLINIC_OR_DEPARTMENT_OTHER): Payer: BC Managed Care – PPO | Admitting: Nurse Practitioner

## 2023-03-29 ENCOUNTER — Other Ambulatory Visit: Payer: Self-pay

## 2023-03-29 ENCOUNTER — Inpatient Hospital Stay: Payer: BC Managed Care – PPO

## 2023-03-29 ENCOUNTER — Inpatient Hospital Stay: Payer: BC Managed Care – PPO | Attending: Genetic Counselor

## 2023-03-29 VITALS — BP 108/72 | HR 78 | Temp 98.2°F | Resp 18 | Wt 79.0 lb

## 2023-03-29 DIAGNOSIS — C799 Secondary malignant neoplasm of unspecified site: Secondary | ICD-10-CM

## 2023-03-29 DIAGNOSIS — Z5112 Encounter for antineoplastic immunotherapy: Secondary | ICD-10-CM | POA: Diagnosis not present

## 2023-03-29 DIAGNOSIS — R530 Neoplastic (malignant) related fatigue: Secondary | ICD-10-CM

## 2023-03-29 DIAGNOSIS — G893 Neoplasm related pain (acute) (chronic): Secondary | ICD-10-CM | POA: Diagnosis not present

## 2023-03-29 DIAGNOSIS — C7951 Secondary malignant neoplasm of bone: Secondary | ICD-10-CM

## 2023-03-29 DIAGNOSIS — Z923 Personal history of irradiation: Secondary | ICD-10-CM | POA: Diagnosis not present

## 2023-03-29 DIAGNOSIS — C7931 Secondary malignant neoplasm of brain: Secondary | ICD-10-CM | POA: Insufficient documentation

## 2023-03-29 DIAGNOSIS — Z171 Estrogen receptor negative status [ER-]: Secondary | ICD-10-CM | POA: Diagnosis not present

## 2023-03-29 DIAGNOSIS — Z79899 Other long term (current) drug therapy: Secondary | ICD-10-CM | POA: Insufficient documentation

## 2023-03-29 DIAGNOSIS — C50312 Malignant neoplasm of lower-inner quadrant of left female breast: Secondary | ICD-10-CM | POA: Insufficient documentation

## 2023-03-29 DIAGNOSIS — Z170 Estrogen receptor positive status [ER+]: Secondary | ICD-10-CM | POA: Insufficient documentation

## 2023-03-29 DIAGNOSIS — R634 Abnormal weight loss: Secondary | ICD-10-CM

## 2023-03-29 DIAGNOSIS — Z853 Personal history of malignant neoplasm of breast: Secondary | ICD-10-CM | POA: Insufficient documentation

## 2023-03-29 DIAGNOSIS — Z95828 Presence of other vascular implants and grafts: Secondary | ICD-10-CM

## 2023-03-29 DIAGNOSIS — Z515 Encounter for palliative care: Secondary | ICD-10-CM | POA: Diagnosis not present

## 2023-03-29 LAB — CBC WITH DIFFERENTIAL (CANCER CENTER ONLY)
Abs Immature Granulocytes: 0.04 10*3/uL (ref 0.00–0.07)
Basophils Absolute: 0 10*3/uL (ref 0.0–0.1)
Basophils Relative: 1 %
Eosinophils Absolute: 0 10*3/uL (ref 0.0–0.5)
Eosinophils Relative: 1 %
HCT: 38.1 % (ref 36.0–46.0)
Hemoglobin: 13 g/dL (ref 12.0–15.0)
Immature Granulocytes: 1 %
Lymphocytes Relative: 14 %
Lymphs Abs: 0.6 10*3/uL — ABNORMAL LOW (ref 0.7–4.0)
MCH: 32.4 pg (ref 26.0–34.0)
MCHC: 34.1 g/dL (ref 30.0–36.0)
MCV: 95 fL (ref 80.0–100.0)
Monocytes Absolute: 0.2 10*3/uL (ref 0.1–1.0)
Monocytes Relative: 6 %
Neutro Abs: 3.4 10*3/uL (ref 1.7–7.7)
Neutrophils Relative %: 77 %
Platelet Count: 234 10*3/uL (ref 150–400)
RBC: 4.01 MIL/uL (ref 3.87–5.11)
RDW: 14.1 % (ref 11.5–15.5)
WBC Count: 4.3 10*3/uL (ref 4.0–10.5)
nRBC: 0 % (ref 0.0–0.2)

## 2023-03-29 LAB — CMP (CANCER CENTER ONLY)
ALT: 13 U/L (ref 0–44)
AST: 17 U/L (ref 15–41)
Albumin: 4 g/dL (ref 3.5–5.0)
Alkaline Phosphatase: 92 U/L (ref 38–126)
Anion gap: 5 (ref 5–15)
BUN: 9 mg/dL (ref 8–23)
CO2: 29 mmol/L (ref 22–32)
Calcium: 9 mg/dL (ref 8.9–10.3)
Chloride: 106 mmol/L (ref 98–111)
Creatinine: 0.32 mg/dL — ABNORMAL LOW (ref 0.44–1.00)
GFR, Estimated: >60 mL/min (ref >60)
Glucose, Bld: 115 mg/dL — ABNORMAL HIGH (ref 70–99)
Potassium: 3.7 mmol/L (ref 3.5–5.1)
Sodium: 140 mmol/L (ref 135–145)
Total Bilirubin: 0.5 mg/dL (ref 0.3–1.2)
Total Protein: 6 g/dL — ABNORMAL LOW (ref 6.5–8.1)

## 2023-03-29 MED ORDER — SODIUM CHLORIDE 0.9 % IV SOLN: Freq: Once | INTRAVENOUS | Status: AC

## 2023-03-29 MED ORDER — ZOLEDRONIC ACID 4 MG/100ML IV SOLN
4.0000 mg | Freq: Once | INTRAVENOUS | Status: AC
  Administered 2023-03-29: 4 mg via INTRAVENOUS
  Filled 2023-03-29: qty 100

## 2023-03-29 MED ORDER — SODIUM CHLORIDE 0.9% FLUSH
10.0000 mL | Freq: Once | INTRAVENOUS | Status: AC
  Administered 2023-03-29: 10 mL

## 2023-03-29 MED ORDER — SODIUM CHLORIDE 0.9 % IV SOLN
10.0000 mg | Freq: Once | INTRAVENOUS | Status: AC
  Administered 2023-03-29: 10 mg via INTRAVENOUS
  Filled 2023-03-29: qty 10

## 2023-03-29 MED ORDER — FAMOTIDINE IN NACL 20-0.9 MG/50ML-% IV SOLN
20.0000 mg | Freq: Once | INTRAVENOUS | Status: AC
  Administered 2023-03-29: 20 mg via INTRAVENOUS
  Filled 2023-03-29: qty 50

## 2023-03-29 MED ORDER — SODIUM CHLORIDE 0.9 % IV SOLN: Freq: Once | INTRAVENOUS | Status: DC

## 2023-03-29 MED ORDER — DIPHENHYDRAMINE HCL 50 MG/ML IJ SOLN
25.0000 mg | Freq: Once | INTRAMUSCULAR | Status: AC
  Administered 2023-03-29: 25 mg via INTRAVENOUS
  Filled 2023-03-29: qty 1

## 2023-03-29 MED ORDER — SODIUM CHLORIDE 0.9 % IV SOLN
80.0000 mg/m2 | Freq: Once | INTRAVENOUS | Status: AC
  Administered 2023-03-29: 102 mg via INTRAVENOUS
  Filled 2023-03-29: qty 17

## 2023-03-30 ENCOUNTER — Ambulatory Visit: Payer: Self-pay | Admitting: Radiation Oncology

## 2023-03-31 ENCOUNTER — Ambulatory Visit (HOSPITAL_COMMUNITY)
Admission: RE | Admit: 2023-03-31 | Discharge: 2023-03-31 | Disposition: A | Discharge status: Home / Self Care | Payer: BC Managed Care – PPO | Source: Ambulatory Visit | Attending: Radiation Oncology | Admitting: Radiation Oncology

## 2023-03-31 DIAGNOSIS — C7931 Secondary malignant neoplasm of brain: Secondary | ICD-10-CM | POA: Insufficient documentation

## 2023-03-31 DIAGNOSIS — C50919 Malignant neoplasm of unspecified site of unspecified female breast: Secondary | ICD-10-CM | POA: Diagnosis not present

## 2023-03-31 MED ORDER — GADOBUTROL 1 MMOL/ML IV SOLN
4.0000 mL | Freq: Once | INTRAVENOUS | Status: AC | PRN
  Administered 2023-03-31: 4 mL via INTRAVENOUS

## 2023-04-02 ENCOUNTER — Inpatient Hospital Stay: Payer: BC Managed Care – PPO

## 2023-04-03 ENCOUNTER — Encounter: Payer: Self-pay | Admitting: Hematology

## 2023-04-09 ENCOUNTER — Inpatient Hospital Stay: Payer: BC Managed Care – PPO

## 2023-04-09 NOTE — Progress Notes (Unsigned)
Palliative Medicine [facility]  Telephone:(336) [phone] Fax:(336) [phone]   Name: Martha [name] Date: 04/09/2023 MRN: [id number]  DOB: 09-15-1960  Patient Care Team: Ardith Dark, MD as PCP - General (Family Medicine) Almond Lint, MD as Consulting Physician (General Surgery) Malachy Mood, MD as Consulting Physician (Hematology) Lonie Peak, MD as Attending Physician (Radiation Oncology) Pollyann Samples, NP as Nurse Practitioner (Nurse Practitioner)    INTERVAL HISTORY: Martha [name] is a 62 y.o. female with oncologic medical history including ER+ breast cancer (01/2018), right breast cancer (04/2021), now with metastatic disease progression involving brain and liver, pathological fractures with osseous mets s/p brain radiation. .  Palliative ask to see for symptom management and goals of care.   SOCIAL HISTORY:    Martha [name] reports that she has been smoking cigarettes. She has a 20 pack-year smoking history. She has never used smokeless tobacco. She reports that she does not currently use alcohol after a past usage of about 28.0 standard drinks of alcohol per week. She reports that she does not use drugs.  ADVANCE DIRECTIVES:  Has documents on file  CODE STATUS: DNR  PAST MEDICAL HISTORY: Past Medical History:  Diagnosis Date   Anxiety    Cancer ([name]) 2022   right breast LCIS   Cancer ([name]) 2019   left breast   Depression    Dysrhythmia    SVT, s/p ablation ~ 2012 in at [facility]   Ectopic pregnancy    Family history of breast cancer    Family history of melanoma    History of radiation therapy 04/22/18-06/03/18   Left Breast, left SCV, axilla 50 Gy in 25 fractions, Left breast boost 10 Gy in 5 fractions.    Hypothyroidism    Personal history of radiation therapy    PONV (postoperative nausea and vomiting)    Thyroid disease     ALLERGIES:  is allergic to morphine and codeine.  MEDICATIONS:  Current Outpatient  Medications  Medication Sig Dispense Refill   acetaminophen (TYLENOL) 500 MG tablet Take 2 tablets (1,000 mg total) by mouth every 8 (eight) hours. 30 tablet 0   citalopram (CELEXA) 40 MG tablet Take 1 tablet (40 mg total) by mouth daily. 30 tablet 3   diphenoxylate-atropine (LOMOTIL) 2.5-0.025 MG tablet Take 1 tablet by mouth 4 (four) times daily as needed for diarrhea or loose stools. 60 tablet 0   levothyroxine (SYNTHROID) 75 MCG tablet Take 1 tablet (75 mcg total) by mouth daily before breakfast. 30 tablet 0   lidocaine (XYLOCAINE) 2 % solution Patient: Mix 1part 2% viscous lidocaine, 1part H20. Swallow 10mL of diluted mixture, before meals and at bedtime, up to QID 100 mL 3   lidocaine-prilocaine (EMLA) cream Apply to affected area once (Patient taking differently: Apply 1 Application topically daily as needed (burning).) 30 g 3   Loperamide HCl (IMODIUM PO) Take by mouth as needed.     LORazepam (ATIVAN) 0.5 MG tablet Take 1 tablet (0.5 mg total) by mouth every 6 (six) hours as needed for anxiety (agitation). 30 tablet 0   Mouthwashes (MOUTH RINSE) LIQD solution 15 mLs by Mouth Rinse route as needed (for oral care).  0   nicotine (NICODERM CQ - DOSED IN MG/24 HR) 7 mg/24hr patch Place 1 patch (7 mg total) onto the skin daily. 28 patch 0   ondansetron (ZOFRAN) 8 MG tablet Take 1 tablet (8 mg total) by mouth every 8 (eight) hours as needed  for nausea or vomiting. 20 tablet 1   OVER THE COUNTER MEDICATION Take 1 tablet by mouth daily. Protandim Supplement     oxyCODONE (OXY IR/ROXICODONE) 5 MG immediate release tablet Take 0.5-1 tablets (2.5-5 mg total) by mouth every 4 (four) hours as needed for moderate pain or severe pain (dyspnea). 30 tablet 0   pregabalin (LYRICA) 25 MG capsule Take 1 capsule (25 mg total) by mouth 2 (two) times daily. 60 capsule 2   senna-docusate (SENOKOT-S) 8.6-50 MG tablet Take 1 tablet by mouth 2 (two) times daily. 60 tablet 0   No current facility-administered  medications for this visit.    VITAL SIGNS: There were no vitals taken for this visit. There were no vitals filed for this visit.  Estimated body mass index is 12.75 kg/m as calculated from the following:   Height as of 03/22/23: 5\' 6"  (1.676 m).   Weight as of 03/29/23: 79 lb (35.8 kg).   PERFORMANCE STATUS (ECOG) : 3 - Symptomatic, >50% confined to bed   Physical Exam General: NAD, thin   Cardiovascular: regular rate and rhythm Pulmonary: normal breathing pattern Abdomen: soft, nontender, + bowel sounds Extremities: no edema, no joint deformities Skin: no rashes, muscle wasting  Neurological: alert, oriented x 4  IMPRESSION: Martha [name] presents to clinic today for follow-up. No acute distress. Her husband is present. Denies nausea, vomiting, constipation, or diarrhea. States she feels well overall. No significantly new symptoms. Shares that she attended a wedding over the weekend which she enjoyed.   Unfortunately she has new brain metastasis. She is remaining in good spirits regarding this. Expresses that she is remaining hopeful for stability and thankfulness that there are treatment options that she can try. Support provided. She and husband express goals are to continue to treat the treatable allowing every opportunity for her to continue thriving while remaining hopeful.  Decreased appetite/weight loss Appetite continues to be stable. Current weight stable at 79lbs. She is enjoying eating foods that she enjoys. Drinking daily protein shakes.    Pain Mrs. Borck shares pain is minimal. Occasional aches and pains. Lyrica as prescribed. Tylenol as needed. Describes pain in sacral and back area. Some stiffness and tenderness to neck area.   No symptom management needs at this time. Will continue to support as needed.   PLAN: Continue intake of Ensure and soft, high-calorie foods. Continue Lyrica 25 mg twice daily. Tylenol as needed for mild aches and pain.  Protective skin  barrier to sacral area. Ongoing goals of care discussions and symptom management Palliative will plan to see patient back in 2-4 weeks in collaboration to other oncology appointments.  Patient and husband knows to contact office sooner if needed.   Patient expressed understanding and was in agreement with this plan. She also understands that She can call the clinic at any time with any questions, concerns, or complaints.      Any controlled substances utilized were prescribed in the context of palliative care. PDMP has been reviewed.   Visit consisted of counseling and education dealing with the complex and emotionally intense issues of symptom management and palliative care in the setting of serious and potentially life-threatening illness.Greater than 50%  of this time was spent counseling and coordinating care related to the above assessment and plan.  Willette Alma, AGPCNP-BC  Palliative Medicine Team/[facility]

## 2023-04-10 ENCOUNTER — Encounter: Payer: Self-pay | Admitting: Radiation Oncology

## 2023-04-10 ENCOUNTER — Other Ambulatory Visit: Payer: Self-pay

## 2023-04-10 ENCOUNTER — Ambulatory Visit
Admission: RE | Admit: 2023-04-10 | Discharge: 2023-04-10 | Disposition: A | Discharge status: Home / Self Care | Payer: BC Managed Care – PPO | Source: Ambulatory Visit | Attending: Radiation Oncology | Admitting: Radiation Oncology

## 2023-04-10 VITALS — BP 114/52 | HR 89 | Temp 97.9°F | Resp 18 | Ht 62.0 in | Wt 79.6 lb

## 2023-04-10 DIAGNOSIS — C799 Secondary malignant neoplasm of unspecified site: Secondary | ICD-10-CM

## 2023-04-10 DIAGNOSIS — R197 Diarrhea, unspecified: Secondary | ICD-10-CM | POA: Insufficient documentation

## 2023-04-10 DIAGNOSIS — Z79890 Hormone replacement therapy (postmenopausal): Secondary | ICD-10-CM | POA: Diagnosis not present

## 2023-04-10 DIAGNOSIS — C7951 Secondary malignant neoplasm of bone: Secondary | ICD-10-CM | POA: Diagnosis not present

## 2023-04-10 DIAGNOSIS — Z923 Personal history of irradiation: Secondary | ICD-10-CM | POA: Insufficient documentation

## 2023-04-10 DIAGNOSIS — C50312 Malignant neoplasm of lower-inner quadrant of left female breast: Secondary | ICD-10-CM | POA: Insufficient documentation

## 2023-04-10 DIAGNOSIS — Z170 Estrogen receptor positive status [ER+]: Secondary | ICD-10-CM | POA: Diagnosis not present

## 2023-04-10 DIAGNOSIS — G629 Polyneuropathy, unspecified: Secondary | ICD-10-CM | POA: Diagnosis not present

## 2023-04-10 DIAGNOSIS — Z79899 Other long term (current) drug therapy: Secondary | ICD-10-CM | POA: Insufficient documentation

## 2023-04-10 DIAGNOSIS — C7931 Secondary malignant neoplasm of brain: Secondary | ICD-10-CM | POA: Diagnosis not present

## 2023-04-10 NOTE — Progress Notes (Signed)
Radiation Oncology         [phone]) [phone] ________________________________  Name: Martha [name] MRN: [id number]  Date: 04/10/2023  DOB: 01-08-1961  Follow-Up Visit Note  Outpatient  CC: Martha Dark, MD  Martha Dark, MD  Diagnosis and Prior Radiotherapy: C79.31    ICD-10-CM   1. Metastatic adenocarcinoma ([name]) [C79.9]  C79.9     2. Secondary cancer of brain ([name])  C79.31      CHIEF COMPLAINT: Here for follow-up and surveillance of brain and bone cancer  Narrative:  The patient returns today for routine follow-up.  Martha [name] presents today for follow-up after completing SRS treatment to her brain and external beam palliative radiation to lower spine in February 2024  Recent neurologic symptoms, if any:  Seizures: no Headaches: no Nausea: no Dizziness/ataxia: no Difficulty with hand coordination: yes right hand, difficulty with fine motor skills.  Focal numbness/weakness: yes Visual deficits/changes: no Confusion/Memory deficits: no   Other issues of note: Reports diarrhea that is related to chemo.   Additional comments if applicable: Patient recently went to a wedding for a Martha [name] at the cancer center and to the [place] with her [place].                       ALLERGIES:  is allergic to morphine and codeine.  Meds: Current Outpatient Medications  Medication Sig Dispense Refill   acetaminophen (TYLENOL) 500 MG tablet Take 2 tablets (1,000 mg total) by mouth every 8 (eight) hours. 30 tablet 0   citalopram (CELEXA) 40 MG tablet Take 1 tablet (40 mg total) by mouth daily. 30 tablet 3   diphenoxylate-atropine (LOMOTIL) 2.5-0.025 MG tablet Take 1 tablet by mouth 4 (four) times daily as needed for diarrhea or loose stools. 60 tablet 0   levothyroxine (SYNTHROID) 75 MCG tablet Take 1 tablet (75 mcg total) by mouth daily before breakfast. 30 tablet 0   lidocaine (XYLOCAINE) 2 % solution Patient: Mix 1part 2% viscous lidocaine, 1part H20. Swallow 10mL of  diluted mixture, before meals and at bedtime, up to QID 100 mL 3   lidocaine-prilocaine (EMLA) cream Apply to affected area once (Patient taking differently: Apply 1 Application topically daily as needed (burning).) 30 g 3   Loperamide HCl (IMODIUM PO) Take by mouth as needed.     LORazepam (ATIVAN) 0.5 MG tablet Take 1 tablet (0.5 mg total) by mouth every 6 (six) hours as needed for anxiety (agitation). 30 tablet 0   Mouthwashes (MOUTH RINSE) LIQD solution 15 mLs by Mouth Rinse route as needed (for oral care).  0   nicotine (NICODERM CQ - DOSED IN MG/24 HR) 7 mg/24hr patch Place 1 patch (7 mg total) onto the skin daily. 28 patch 0   ondansetron (ZOFRAN) 8 MG tablet Take 1 tablet (8 mg total) by mouth every 8 (eight) hours as needed for nausea or vomiting. 20 tablet 1   OVER THE COUNTER MEDICATION Take 1 tablet by mouth daily. Protandim Supplement     oxyCODONE (OXY IR/ROXICODONE) 5 MG immediate release tablet Take 0.5-1 tablets (2.5-5 mg total) by mouth every 4 (four) hours as needed for moderate pain or severe pain (dyspnea). 30 tablet 0   pregabalin (LYRICA) 25 MG capsule Take 1 capsule (25 mg total) by mouth 2 (two) times daily. 60 capsule 2   senna-docusate (SENOKOT-S) 8.6-50 MG tablet Take 1 tablet by mouth 2 (two) times daily. 60 tablet 0   No current facility-administered medications for  this encounter.    Physical Findings: The patient is in no acute distress. Patient is alert and oriented. Thin  height is 5\' 2"  (1.575 m) and weight is 79 lb 9.6 oz (36.1 kg). Her temperature is 97.9 F (36.6 C). Her blood pressure is 114/52 (abnormal) and her pulse is 89. Her respiration is 18 and oxygen saturation is 98%. Marland Kitchen    HEENT: facial symmetry, no thrush; resolving alopecia. Muskuloskeletal: symmetric strength throughout, ambulating via wheelchair. No edema Neuro: intact sensation, CN II-X grossly intact. No obvious neurologic abnormalities.   KPS = 60  100 - Normal; no complaints; no  evidence of disease. 90   - Able to carry on normal activity; minor signs or symptoms of disease. 80   - Normal activity with effort; some signs or symptoms of disease. 1   - Cares for self; unable to carry on normal activity or to do active work. 60   - Requires occasional assistance, but is able to care for most of his personal needs. 50   - Requires considerable assistance and frequent medical care. 40   - Disabled; requires special care and assistance. 30   - Severely disabled; hospital admission is indicated although death not imminent. 20   - Very sick; hospital admission necessary; active supportive treatment necessary. 10   - Moribund; fatal processes progressing rapidly. 0     - Dead  Karnofsky DA, Abelmann WH, Craver LS and Burchenal [facility] [phone]) The use of the nitrogen mustards in the palliative treatment of carcinoma: with particular reference to bronchogenic carcinoma Cancer 1 634-56   Lab Findings: Lab Results  Component Value Date   WBC 4.3 03/29/2023   HGB 13.0 03/29/2023   HCT 38.1 03/29/2023   MCV 95.0 03/29/2023   PLT 234 03/29/2023    Radiographic Findings: MR Brain W Wo Contrast  Result Date: 04/09/2023 CLINICAL DATA:  62 year old female status post radiation treatment for numerous brain metastases. Breast cancer. Restaging. EXAM: MRI HEAD WITHOUT AND WITH CONTRAST TECHNIQUE: Multiplanar, multiecho pulse sequences of the brain and surrounding structures were obtained without and with intravenous contrast. CONTRAST:  4mL GADAVIST GADOBUTROL 1 MMOL/ML IV SOLN COMPARISON:  Post treatment brain MRI 12/21/2022 and earlier. FINDINGS: BRAIN Left cerebellopontine angle lesion is stable in size, less solidly enhancing on series 1100, image 105. The other treated cerebellar lesions are not definitely visible on axial black blood postcontrast. However, there is suggestion of new miliary pattern of pathologic cerebellar enhancement enhancement such as series 11, image 110, 101. And  a small new right superior cerebellar lesion which is also visible on coronal postcontrast series 1100 image 120 and series 9, image 33. And suspicious new miliary pattern of enhancement is also now noted throughout the bilateral basal ganglia slightly greater on the lot Left, e.g. series 1100, image 154. This is also correlated on the coronal postcontrast series 9 image 17. Other evidence of innumerable new brain metastases including series 1100, image 178 in the right parieto-occipital sulcus, image 168 in the nearby superior right occipital lobe, image 146 with 2 lesions also in the right occipital lobe. Confirmed small new posterior left sylvian fissure metastasis on series 1100, image 149., similar size new posterior left frontal lobe metastasis series 1100, image 189. Left parietal lobe subcentimeter metastasis image 200, and roughly 1/2 dozen to a dozen additional punctate metastases scattered in the bilateral superior frontal gyri above that level. Due to the massive number of lesions, only a few are annotated with arrows on  series 1100, and all lesions other than that on image 105 are new. In the left CP angle on image 105 are new. No associated vasogenic edema or intracranial mass effect at this time. No convincing transependymal enhancement. No pachymeningeal or obvious leptomeningeal thickening or enhancement. Other Brain findings: No superimposed restricted diffusion to suggest acute infarction. No midline shiftventriculomegaly, extra-axial collection or acute intracranial hemorrhage. Normal basilar cisterns. Vascular: Major intracranial vascular flow voids are stable. Skull and upper cervical spine: Previous long segment cervical posterior fusion. Heterogeneous marrow signal in the cervical spine and calvarium. No destructive calvarium lesion. But a small 5 mm clivus metastasis on series 2, image 16 is new since January, stable more recently. Sinuses/Orbits: Negative. Other: Right mastoid effusion is  stable. IMPRESSION: 1. Innumerable subcentimeter new brain metastases, most punctate and less than 2 mm in size, including profound involvement in the bilateral basal ganglia. 2. No associated cerebral edema or mass effect at this time. 3. Study discussed by telephone with Dr. Maralyn Sago  on 04/09/2023 at 09:15. Electronically Signed   By: Odessa Fleming M.D.   On: 04/09/2023 09:19   I personally reviewed these images.   Impression/Plan: Patient is doing very well overall symptomatically today.  I personally reviewed her most recent MRI with radiologist Dr Margo Aye, and it unfortunately shows innumerable punctate lesions, highly concerning for elsewhere disease progression in brain after SRS. I had a lengthy discussion with the patient today about her treatment options. One approach is to continue to monitoring these lesions with close follow-up scans. I also discussed the option of whole brain radiotherapy, which is the most aggressive option and would carry acute and possibly long term cognitive side effects; however, the metastatic disease also poses significant risks to her brain. Additionally, we can explore with Dr. Mosetta Putt whether any drug therapies are available to prevent disease progression by crossing the blood brain barrier. Patient is currently interested in pursuing drug therapies and avoiding WBRT if possible due to her lack of energy / fragile physical status. We will discuss her case at tumor board on Monday, 04/16/23, with my colleagues. I will share this discussion with Dr. Mosetta Putt and the patient before pursuing a treatment option.    On date of service, in total, I spent 30 minutes on this encounter. Patient was seen in person.  _____________________________________   Joyice Faster, PA-C   Lonie Peak, MD

## 2023-04-11 ENCOUNTER — Other Ambulatory Visit: Payer: Self-pay | Admitting: Hematology and Oncology

## 2023-04-11 NOTE — Progress Notes (Addendum)
Patient Care Team: Ardith Dark, MD as PCP - General (Family Medicine) Almond Lint, MD as Consulting Physician (General Surgery) Malachy Mood, MD as Consulting Physician (Hematology) Lonie Peak, MD as Attending Physician (Radiation Oncology) Pollyann Samples, NP as Nurse Practitioner (Nurse Practitioner)   CHIEF COMPLAINT: Follow-up metastatic breast cancer  Oncology History Overview Note  Cancer Staging Malignant neoplasm of lower-inner quadrant of left breast in female, estrogen receptor positive [facility]) Staging form: Breast, AJCC 8th Edition - Clinical stage from 01/16/2018: Stage IA (cT1c, cN0, cM0, G2, ER+, PR+, HER2-) - Signed by Malachy Mood, MD on 01/23/2018 - Pathologic: Stage IA (pT1c, pN1, cM0, G1, ER+, PR+, HER2-) - Signed by Lonie Peak, MD on 04/09/2018     Malignant neoplasm of lower-inner quadrant of left breast in female, estrogen receptor positive ([name])  01/15/2018 Mammogram   Diagnositc Mammogram 01/15/18  IMPRESSION: 1. Suspicious 1.2 x 1.4 x 1.3 cm mixed echogenicity mass left breast 7 o'clock position retroareolar location at the site of palpable concern.. 2. Indeterminate Within the left breast 7:30 o'clock retroareolar location, adjacent to the palpable mass, is a 0.5 x 0.4 x 0.5 cm oval circumscribed hypoechoic mass. 3. Indeterminate calcifications within the lateral left breast. Location of these calcifications is not definitely confirmed on the true lateral view.    01/16/2018 Initial Biopsy   Diagnosis 01/16/18 1. Breast, left, needle core biopsy, 7:30 o'clock (ribbon clip) - FIBROCYSTIC CHANGES WITH SCLEROSING ADENOSIS AND CALCIFICATIONS. - FIBROADENOMATOID CHANGE. - NO MALIGNANCY IDENTIFIED. 2. Breast, left, needle core biopsy, 7 o'clock position (coil clip) - INVASIVE MAMMARY CARCINOMA, MSBR GRADE I/II. - SEE MICROSCOPIC DESCRIPTION Microscopic Comment  ADDENDUM: Immunohistochemistry for E-Cadherin is strongly positive in the tumor consistent  with ductal carcinoma. (JDP:ah 01/17/18)   01/16/2018 Receptors her2   Estrogen Receptor: 100%, POSITIVE, STRONG STAINING INTENSITY Progesterone Receptor: 50%, POSITIVE, STRONG STAINING INTENSITY Proliferation Marker Ki67: 20% HER2 Negative   01/16/2018 Cancer Staging   Staging form: Breast, AJCC 8th Edition - Clinical stage from 01/16/2018: Stage IA (cT1c, cN0, cM0, G2, ER+, PR+, HER2-) - Signed by Malachy Mood, MD on 01/23/2018   01/22/2018 Initial Diagnosis   Malignant neoplasm of lower-inner quadrant of left breast in female, estrogen receptor positive ([name])   02/21/2018 Surgery    LEFT BREAST LUMPECTOMY WITH AXILLARY LYMPH NODE BIOPSY by Dr. Donell Beers  02/21/18   02/21/2018 Pathology Results   Diagnosis 02/21/18 1. Breast, lumpectomy, Left - INVASIVE DUCTAL CARCINOMA, GRADE I, 1.6 CM. - DUCTAL CARCINOMA IN SITU, INTERMEDIATE NUCLEAR GRADE. - ANTERIOR AND MEDIAL RESECTION MARGINS ARE POSITIVE FOR CARCINOMA. - NEGATIVE FOR LYMPHOVASCULAR OR PERINEURAL INVASION. - BACKGROUND BREAST TISSUE WITH FIBROCYSTIC CHANGE, INCLUDING SCLEROSING ADENOSIS. - BIOPSY SITE CHANGES. - SEE ONCOLOGY TABLE. 2. Lymph node, sentinel, biopsy, Left Axillary #1 - METASTATIC BREAST CARCINOMA TO A LYMPH NODE, 1.0 CM IN GREATEST DIMENSION, WITH EXTRANODAL EXTENSION (1/1). 3. Lymph node, sentinel, biopsy, Left Axillary #2 - LYMPH NODE, NEGATIVE FOR CARCINOMA (0/1).    02/21/2018 Miscellaneous   Mammaprint 02/21/18 Low Risk with 10-year risk of recurrnce at 10% -No potential signifcant chemotherapy benefit   03/20/2018 Pathology Results   RE-EXCISION OF BREAST LUMPECTOMY by Dr. Donell Beers  Diagnosis 03/20/18 1. Breast, excision, Left new anterior margin - FIBROCYSTIC CHANGES WITH ADENOSIS AND CALCIFICATIONS. - HEALING BIOPSY SITE. - THERE IS NO EVIDENCE OF MALIGNANCY. 2. Breast, excision, Left new medial margin - FIBROCYSTIC CHANGES WITH ADENOSIS AND CALCIFICATIONS. - HEALING BIOPSY SITE. - THERE IS NO EVIDENCE OF  MALIGNANCY. Microscopic  Comment 1. -2. The surgical resection margin(s) of the specimen were inked and microscopically evaluated. (JBK:kh 03-22-18)   04/09/2018 Cancer Staging   Staging form: Breast, AJCC 8th Edition - Pathologic: Stage IA (pT1c, pN1, cM0, G1, ER+, PR+, HER2-) - Signed by Lonie Peak, MD on 04/09/2018   04/22/2018 - 06/03/2018 Radiation Therapy   Radaiton with Dr. Basilio Cairo 04/22/18-06/03/18   05/2018 -  Anti-estrogen oral therapy   Letrozole 2.5mg  started 05/2018    Survivorship   Per Santiago Glad, NP    05/04/2022 Imaging    IMPRESSION: Cervical spondylosis, as described.   Nonspecific straightening of the expected cervical lordosis.   07/24/2022 Imaging    IMPRESSION: 1. Extensive osseous metastatic disease with pathologic fracture at the base of dens and C2 right lateral mass. Extraosseous tumor at C1 and C2 likely impinging on the right C2 and C3 nerve roots. 2. Degenerative cord impingement at C4-5 to C6-7. Biforaminal impingement at C5-6 and C6-7.   08/03/2022 PET scan    IMPRESSION: 1. Large volume osseous metastasis. 2. Low right cervical and probable right axillary nodal metastasis. 3. Subtle heterogeneous activity throughout the liver with suggestion of small liver lesions (likely new compared to chest CT of 07/16/2020). Findings are overall moderately suspicious for hepatic metastasis. Pre and post contrast abdominal MRI (preferred) or CT could confirm. 4. Right-sided pleural thickening and trace pleural fluid. Right base airspace disease is favored to represent chronic atelectasis. 5. Aortic atherosclerosis (ICD10-I70.0) and emphysema (ICD10-J43.9).     08/11/2022 Genetic Testing   Negative genetic testing on the CancerNext-Expanded+RNAinsight panel.  The report date is August 11, 2022.  The CancerNext-Expanded gene panel offered by [facility] and includes sequencing and rearrangement analysis for the following 77 genes: AIP, ALK, APC*,  ATM*, AXIN2, BAP1, BARD1, BLM, BMPR1A, BRCA1*, BRCA2*, BRIP1*, CDC73, CDH1*, CDK4, CDKN1B, CDKN2A, CHEK2*, CTNNA1, DICER1, FANCC, FH, FLCN, GALNT12, KIF1B, LZTR1, MAX, MEN1, MET, MLH1*, MSH2*, MSH3, MSH6*, MUTYH*, NBN, NF1*, NF2, NTHL1, PALB2*, PHOX2B, PMS2*, POT1, PRKAR1A, PTCH1, PTEN*, RAD51C*, RAD51D*, RB1, RECQL, RET, SDHA, SDHAF2, SDHB, SDHC, SDHD, SMAD4, SMARCA4, SMARCB1, SMARCE1, STK11, SUFU, TMEM127, TP53*, TSC1, TSC2, VHL and XRCC2 (sequencing and deletion/duplication); EGFR, EGLN1, HOXB13, KIT, MITF, PDGFRA, POLD1, and POLE (sequencing only); EPCAM and GREM1 (deletion/duplication only). DNA and RNA analyses performed for * genes.    Lobular carcinoma in situ (LCIS) of right breast  01/20/2020 Mammogram   Diagnostic Mammogram 01/20/20 IMPRESSION: 1.  Stable post lumpectomy changes of the left breast.   2. Suspicious microcalcifications over the right upper outer quadrant spanning 3.6 cm.   02/02/2020 Initial Biopsy   Diagnosis 02/02/20 Breast, right, needle core biopsy, upper outer quadrant, x clip - LOBULAR CARCINOMA IN SITU WITH PLEOMORPHIC FEATURES AND CALCIFICATIONS, INVOLVING ADENOSIS. SEE NOTE Diagnosis Note Immunohistochemical stain for E-cadherin is negative in the lesional cells, consistent with a lobular phenotype. Immunostains for p63, SMM 1 and calponin do not show evidence of invasive carcinoma.    02/04/2020 Initial Diagnosis   Lobular carcinoma in situ (LCIS) of right breast   03/18/2020 Surgery   RIGHT BREAST LUMPECTOMY WITH RADIOACTIVE SEED LOCALIZATION by Dr Alvira Monday    03/18/2020 Pathology Results   FINAL MICROSCOPIC DIAGNOSIS:   A. BREAST, RIGHT, LUMPECTOMY:  - Pleomorphic lobular carcinoma in situ with calcifications and  underlying complex sclerosing lesion, adenosis and fibroadenomatoid  change.  - Margins of resection are not involved (Closest margins: < 1 mm,  anterior, posterior, inferior and medial).  - Biopsy site.    COMMENT:  P63, Calponin and SMM-1  demonstrate the presence of myoepithelium in the  select focus.    Metastatic breast cancer to bone and brain  05/04/2022 Imaging    IMPRESSION: Cervical spondylosis, as described.   Nonspecific straightening of the expected cervical lordosis.   07/24/2022 Imaging    IMPRESSION: 1. Extensive osseous metastatic disease with pathologic fracture at the base of dens and C2 right lateral mass. Extraosseous tumor at C1 and C2 likely impinging on the right C2 and C3 nerve roots. 2. Degenerative cord impingement at C4-5 to C6-7. Biforaminal impingement at C5-6 and C6-7.   08/02/2022 Initial Diagnosis   Cancer, metastatic to bone ([name])   08/03/2022 PET scan    IMPRESSION: 1. Large volume osseous metastasis. 2. Low right cervical and probable right axillary nodal metastasis. 3. Subtle heterogeneous activity throughout the liver with suggestion of small liver lesions (likely new compared to chest CT of 07/16/2020). Findings are overall moderately suspicious for hepatic metastasis. Pre and post contrast abdominal MRI (preferred) or CT could confirm. 4. Right-sided pleural thickening and trace pleural fluid. Right base airspace disease is favored to represent chronic atelectasis. 5. Aortic atherosclerosis (ICD10-I70.0) and emphysema (ICD10-J43.9).     10/26/2022 -  Chemotherapy   Patient is on Treatment Plan : BREAST Paclitaxel D1,8,15 + Trastuzumab D1 + Pertuzumab D1 q21d x 8 cycles / Trastuzumab D1 + Pertuzumab D1 q21d x 4 cycles        CURRENT THERAPY:  -Zometa q3 months -starting 09/2022: Paclitaxel D1,8,15 + Trastuzumabd D1 + Pertuzumab D1 q21d  -Changed to taxol 2 weeks on/1 week off and q3 weeks Trastuzumab/Pertuzumab on 02/08/23   INTERVAL HISTORY Martha [name] returns with her husband for follow-up as scheduled.  Last seen by Dr. Mosetta Putt 03/22/2023 and completed another cycle of Kanjinti/Perjeta/Taxol.  She tolerates treatment well except periodic diarrhea which is somewhat  unpredictable, but manageable with Imodium and Lomotil.  She had a brain MRI 03/31/2023 which showed innumerable subcentimeter new brain metastases. Denies headache, dizziness, n/v. She has neuropathy from previous cord compression which has improved (R > L in fingertips). She is eating and drinking. Activity level is low, may take 2 naps per day and sleep all night, but able to be up some at home for short periods.   ROS  All other systems reviewed and negative  Past Medical History:  Diagnosis Date   Anxiety    Cancer ([name]) 2022   right breast LCIS   Cancer ([name]) 2019   left breast   Depression    Dysrhythmia    SVT, s/p ablation ~ 2012 in at [facility]   Ectopic pregnancy    Family history of breast cancer    Family history of melanoma    History of radiation therapy 04/22/18-06/03/18   Left Breast, left SCV, axilla 50 Gy in 25 fractions, Left breast boost 10 Gy in 5 fractions.    Hypothyroidism    Personal history of radiation therapy    PONV (postoperative nausea and vomiting)    Thyroid disease      Past Surgical History:  Procedure Laterality Date   APPENDECTOMY     BILATERAL SALPINGECTOMY     BREAST BIOPSY Right 2014   fibroadenoma   BREAST BIOPSY Left 01/16/2018   BREAST BIOPSY Right 02/02/2020   LCIS   BREAST BIOPSY Right 08/04/2020   x2 LCIS   BREAST BIOPSY Right 08/13/2020   x2   BREAST EXCISIONAL BIOPSY Left 1990   benign   BREAST  EXCISIONAL BIOPSY Right 02/02/2020   LCIS   BREAST LUMPECTOMY Left 01/22/2018   BREAST LUMPECTOMY WITH AXILLARY LYMPH NODE BIOPSY Left 02/21/2018   Procedure: LEFT BREAST LUMPECTOMY WITH AXILLARY LYMPH NODE BIOPSY;  Surgeon: Almond Lint, MD;  Location: [facility] OR;  Service: General;  Laterality: Left;   BREAST LUMPECTOMY WITH RADIOACTIVE SEED LOCALIZATION Right 03/18/2020   Procedure: RIGHT BREAST LUMPECTOMY WITH RADIOACTIVE SEED LOCALIZATION;  Surgeon: Almond Lint, MD;  Location: [facility] OR;  Service: General;  Laterality:  Right;   BREAST SURGERY Left 1993   cyst removed   ENDOMETRIAL ABLATION     EYE SURGERY     GLAUCOMA SURGERY Bilateral    IR IMAGING GUIDED PORT INSERTION  10/16/2022   POSTERIOR CERVICAL FUSION/FORAMINOTOMY N/A 08/17/2022   Procedure: Cervical One-Cervical Four POSTERIOR CERVICAL FUSION,  Cervical One LAMINECTOMY REDUCTION OF Cervical Two Fracture, Biopsy of Right Cerical Two Pars  Lesion;  Surgeon: Bedelia Person, MD;  Location: [facility] OR;  Service: Neurosurgery;  Laterality: N/A;   POSTERIOR CERVICAL FUSION/FORAMINOTOMY N/A 09/06/2022   Procedure: Posterior Cervical Fusion, Foraminotomy , Cervical Five-Six, Cervical Six-Seven; Extension of  fusion Cervical Four- Thoracic One;  Surgeon: Bedelia Person, MD;  Location: [facility] OR;  Service: Neurosurgery;  Laterality: N/A;   RADIOACTIVE SEED GUIDED EXCISIONAL BREAST BIOPSY Right 04/28/2021   Procedure: RADIOACTIVE SEED GUIDED EXCISIONAL RIGHT BREAST BIOPSY X2;  Surgeon: Almond Lint, MD;  Location: [facility];  Service: General;  Laterality: Right;   RE-EXCISION OF BREAST LUMPECTOMY Left 03/20/2018   Procedure: RE-EXCISION OF BREAST LUMPECTOMY;  Surgeon: Almond Lint, MD;  Location: [facility];  Service: General;  Laterality: Left;     Outpatient Encounter Medications as of 04/12/2023  Medication Sig   acetaminophen (TYLENOL) 500 MG tablet Take 2 tablets (1,000 mg total) by mouth every 8 (eight) hours.   citalopram (CELEXA) 40 MG tablet Take 1 tablet (40 mg total) by mouth daily.   diphenoxylate-atropine (LOMOTIL) 2.5-0.025 MG tablet Take 1 tablet by mouth 4 (four) times daily as needed for diarrhea or loose stools.   levothyroxine (SYNTHROID) 75 MCG tablet Take 1 tablet (75 mcg total) by mouth daily before breakfast.   lidocaine (XYLOCAINE) 2 % solution Patient: Mix 1part 2% viscous lidocaine, 1part H20. Swallow 10mL of diluted mixture, before meals and at bedtime, up to QID   lidocaine-prilocaine (EMLA)  cream Apply to affected area once (Patient taking differently: Apply 1 Application topically daily as needed (burning).)   Loperamide HCl (IMODIUM PO) Take by mouth as needed.   LORazepam (ATIVAN) 0.5 MG tablet Take 1 tablet (0.5 mg total) by mouth every 6 (six) hours as needed for anxiety (agitation).   Mouthwashes (MOUTH RINSE) LIQD solution 15 mLs by Mouth Rinse route as needed (for oral care).   nicotine (NICODERM CQ - DOSED IN MG/24 HR) 7 mg/24hr patch Place 1 patch (7 mg total) onto the skin daily.   ondansetron (ZOFRAN) 8 MG tablet Take 1 tablet (8 mg total) by mouth every 8 (eight) hours as needed for nausea or vomiting.   OVER THE COUNTER MEDICATION Take 1 tablet by mouth daily. Protandim Supplement   oxyCODONE (OXY IR/ROXICODONE) 5 MG immediate release tablet Take 0.5-1 tablets (2.5-5 mg total) by mouth every 4 (four) hours as needed for moderate pain or severe pain (dyspnea).   pregabalin (LYRICA) 25 MG capsule Take 1 capsule (25 mg total) by mouth 2 (two) times daily.   senna-docusate (SENOKOT-S) 8.6-50 MG tablet Take 1 tablet  by mouth 2 (two) times daily.   No facility-administered encounter medications on file as of 04/12/2023.     Today's Vitals   04/12/23 0844 04/12/23 0851  BP: 102/66   Pulse: 94   Temp: 98.4 F (36.9 C)   TempSrc: Temporal   SpO2: 97%   Weight: 78 lb (35.4 kg)   PainSc:  3    Body mass index is 14.27 kg/m.   PHYSICAL EXAM GENERAL:alert, no distress and comfortable SKIN: no rash  EYES: sclera clear LUNGS: clear with normal breathing effort HEART: regular rate & rhythm, no lower extremity edema ABDOMEN: abdomen soft, non-tender and normal bowel sounds NEURO: alert & oriented x 3 with fluent speech PAC without erythema    CBC    Component Value Date/Time   WBC 4.1 04/12/2023 0800   WBC 10.3 10/19/2022 0500   RBC 4.12 04/12/2023 0800   HGB 13.4 04/12/2023 0800   HCT 39.7 04/12/2023 0800   PLT 239 04/12/2023 0800   MCV 96.4 04/12/2023 0800    MCH 32.5 04/12/2023 0800   MCHC 33.8 04/12/2023 0800   RDW 15.1 04/12/2023 0800   LYMPHSABS 0.7 04/12/2023 0800   MONOABS 0.5 04/12/2023 0800   EOSABS 0.0 04/12/2023 0800   BASOSABS 0.0 04/12/2023 0800     CMP     Component Value Date/Time   NA 140 04/12/2023 0800   K 3.7 04/12/2023 0800   CL 106 04/12/2023 0800   CO2 27 04/12/2023 0800   GLUCOSE 115 (H) 04/12/2023 0800   BUN 11 04/12/2023 0800   CREATININE <0.30 (L) 04/12/2023 0800   CALCIUM 8.6 (L) 04/12/2023 0800   PROT 6.2 (L) 04/12/2023 0800   ALBUMIN 4.1 04/12/2023 0800   AST 27 04/12/2023 0800   ALT 21 04/12/2023 0800   ALKPHOS 106 04/12/2023 0800   BILITOT 0.4 04/12/2023 0800   GFRNONAA NOT CALCULATED 04/12/2023 0800   GFRAA >60 05/21/2020 1350     ASSESSMENT & PLAN:Martha [name] is a 62 y.o. female with    Metastatic breast cancer to bone and brain, ER/PR - HER2 + -Diagnosed in 06/2022 -PET  08/03/2022 showed diffuse bone mets  -s/p cervical laminectomy and fusion by Dr. Maisie Fus on 08/17/2022, biopsy confirmed metastatic breast cancer, ER/PR negative, HER2 positive. -due to cervical cord compression with myelopathy, she underwent a second cervical spine surgery on 09/06/2022, and completed inpatient rehabitation. -S/p brain, cervical and lumbar spine radiation in 09/2022 -She started first line systemic chemotherapy with weekly Taxol and trastuzumab/Perjeta on 10/25/22. She has tolerated well so far and her pain has improved overall -PET 01/01/2023 showed complete metabolic response, no hypermetabolic uptake in diffuse bone lesions, no other new lesions.  She is clinically also doing much better.  Given her excellent response to chemo, she changed to Taxol 2 weeks on/ 1 week off (and continues q3 weeks kanjinti/perjeta) as of 02/08/23 -Martha [name] appears stable. Tolerating chemo with diarrhea which is well managed with supportive care at home. She is able to recover and function, PS is fair -Unfortunately recent  brain MRI shows diffuse, innumerable new metastatic disease. She met with Dr. Basilio Cairo and is open to whole brain radiation -PET scan scheduled tomorrow -Pt seen with Dr. Mosetta Putt who recommends changing to oral Xeloda and Tucatinib, starting from low dose, which penetrate the BBB for better control of brain metastasis.  -Chemotherapy consent: Side effects including but not limited to fatigue, nausea, vomiting, diarrhea, neuropathy, hand/foot syndrome, fluid retention, renal and liver dysfunction, neutropenic fever,  need for blood transfusion, bleeding, were discussed with patient in great detail. She agrees to proceed.  -Goal remains palliative -Will review her case in brain tumor board 8/19, and will discuss with Dr. Basilio Cairo to see how long RT course she plans to give.  -Likely start in ~2 weeks -Labs reviewed, adequate to proceed with Taxol/Kanjinti/Perjeta today; will cancel future infusions    Malignant neoplasm of lower-inner quadrant of left breast in female, estrogen receptor positive She was diagnosed in 12/2017. She is s/p left breast lumpectomy and adjuvant radiation.  -She started anti-estrogen therapy with letrozole on 05/2018. Tolerating well with no issues but is no longer taking -lost f/u after visit in 04/2020 until her recurrence in 07/2022    Cancer related pain -2/2 bone metastasis, improved after RT -Currently managed on Cymbalta, lyrica, extra strength tylenol, and rare oxycodone  -f/u with palliative care clinic NP Nikki     PLAN: -Recent brain MRI and today's labs reviewed -Proceed with Taxol/Kanjinti/Perjeta today then d/c -PET scan 8/16 as scheduled -Review in brain tumor board 8/19 -Phone f/up 8/20 to review scan, discussion, and finalize plan -Anticipate f/up and start Xeloda/Tucatinib in ~2 weeks depending on whole brain radiation course -Palliative care f/up today -Pt seen with Dr. Mosetta Putt    Orders Placed This Encounter  Procedures   CBC with Differential (Cancer  Center Only)    Standing Status:   Future    Standing Expiration Date:   04/11/2024   CMP (Cancer Center only)    Standing Status:   Future    Standing Expiration Date:   04/11/2024      All questions were answered. The patient knows to call the clinic with any problems, questions or concerns. No barriers to learning were detected.   Santiago Glad, NP-C 04/12/2023  Addendum I have seen the patient, examined her. I agree with the assessment and and plan and have edited the notes.   Martha [name] is clinically stable, unfortunately her recent brain MRI from April 27, 2023 showed innumerable subcentimeter new brain metastasis.  Dr. Basilio Cairo has saw her back and offered a whole brain radiation which I agree.  Given her HER2 positive disease which particularly has high risk of brain metastasis, I recommend changing her systemic treatment to Tucatinib or neratinib, for better brain penetration and disease control, along with oral capecitabine.  Potential benefit and side effect of this oral drugs, will discussed with her in great detail, she agrees to try.  I will call in Tucatinib and capecitabine to [employer], plan to start in a few weeks.  Due to her overall low performance status and weight, I will start to Tucatinib at low dose, to see how she tolerates.  Capecitabine will be dose reduced due to 850 mg/m bid, 7 days on and 7 days off.  I will stop her chemo Taxol after today's treatment. Will continue chest Zometa every 2 weeks her case will be presented in CNS tumor board next Monday.  All questions were answered.  I spent a total of 40 minutes for her visit today, more than 50% of time on face-to-face counseling.  Malachy Mood MD 04/12/2023

## 2023-04-12 ENCOUNTER — Encounter: Payer: Self-pay | Admitting: Nurse Practitioner

## 2023-04-12 ENCOUNTER — Inpatient Hospital Stay (HOSPITAL_BASED_OUTPATIENT_CLINIC_OR_DEPARTMENT_OTHER): Payer: BC Managed Care – PPO | Admitting: Nurse Practitioner

## 2023-04-12 ENCOUNTER — Inpatient Hospital Stay: Payer: BC Managed Care – PPO

## 2023-04-12 ENCOUNTER — Encounter: Payer: Self-pay | Admitting: Hematology

## 2023-04-12 ENCOUNTER — Other Ambulatory Visit: Payer: Self-pay

## 2023-04-12 VITALS — BP 102/66 | HR 94 | Temp 98.4°F | Wt 78.0 lb

## 2023-04-12 DIAGNOSIS — Z79899 Other long term (current) drug therapy: Secondary | ICD-10-CM | POA: Diagnosis not present

## 2023-04-12 DIAGNOSIS — Z923 Personal history of irradiation: Secondary | ICD-10-CM | POA: Diagnosis not present

## 2023-04-12 DIAGNOSIS — C7951 Secondary malignant neoplasm of bone: Secondary | ICD-10-CM

## 2023-04-12 DIAGNOSIS — Z5112 Encounter for antineoplastic immunotherapy: Secondary | ICD-10-CM | POA: Diagnosis not present

## 2023-04-12 DIAGNOSIS — G893 Neoplasm related pain (acute) (chronic): Secondary | ICD-10-CM | POA: Diagnosis not present

## 2023-04-12 DIAGNOSIS — R530 Neoplastic (malignant) related fatigue: Secondary | ICD-10-CM

## 2023-04-12 DIAGNOSIS — R197 Diarrhea, unspecified: Secondary | ICD-10-CM

## 2023-04-12 DIAGNOSIS — C50312 Malignant neoplasm of lower-inner quadrant of left female breast: Secondary | ICD-10-CM | POA: Diagnosis not present

## 2023-04-12 DIAGNOSIS — Z95828 Presence of other vascular implants and grafts: Secondary | ICD-10-CM

## 2023-04-12 DIAGNOSIS — Z515 Encounter for palliative care: Secondary | ICD-10-CM | POA: Diagnosis not present

## 2023-04-12 DIAGNOSIS — Z170 Estrogen receptor positive status [ER+]: Secondary | ICD-10-CM

## 2023-04-12 DIAGNOSIS — Z853 Personal history of malignant neoplasm of breast: Secondary | ICD-10-CM | POA: Diagnosis not present

## 2023-04-12 DIAGNOSIS — Z7189 Other specified counseling: Secondary | ICD-10-CM | POA: Diagnosis not present

## 2023-04-12 DIAGNOSIS — C7931 Secondary malignant neoplasm of brain: Secondary | ICD-10-CM | POA: Diagnosis not present

## 2023-04-12 DIAGNOSIS — Z171 Estrogen receptor negative status [ER-]: Secondary | ICD-10-CM | POA: Diagnosis not present

## 2023-04-12 LAB — CMP (CANCER CENTER ONLY)
ALT: 21 U/L (ref 0–44)
AST: 27 U/L (ref 15–41)
Albumin: 4.1 g/dL (ref 3.5–5.0)
Alkaline Phosphatase: 106 U/L (ref 38–126)
Anion gap: 7 (ref 5–15)
BUN: 11 mg/dL (ref 8–23)
CO2: 27 mmol/L (ref 22–32)
Calcium: 8.6 mg/dL — ABNORMAL LOW (ref 8.9–10.3)
Chloride: 106 mmol/L (ref 98–111)
Creatinine: <0.3 mg/dL — ABNORMAL LOW (ref 0.44–1.00)
Glucose, Bld: 115 mg/dL — ABNORMAL HIGH (ref 70–99)
Potassium: 3.7 mmol/L (ref 3.5–5.1)
Sodium: 140 mmol/L (ref 135–145)
Total Bilirubin: 0.4 mg/dL (ref 0.3–1.2)
Total Protein: 6.2 g/dL — ABNORMAL LOW (ref 6.5–8.1)

## 2023-04-12 LAB — CBC WITH DIFFERENTIAL (CANCER CENTER ONLY)
Abs Immature Granulocytes: 0.02 10*3/uL (ref 0.00–0.07)
Basophils Absolute: 0 10*3/uL (ref 0.0–0.1)
Basophils Relative: 1 %
Eosinophils Absolute: 0 10*3/uL (ref 0.0–0.5)
Eosinophils Relative: 1 %
HCT: 39.7 % (ref 36.0–46.0)
Hemoglobin: 13.4 g/dL (ref 12.0–15.0)
Immature Granulocytes: 1 %
Lymphocytes Relative: 17 %
Lymphs Abs: 0.7 10*3/uL (ref 0.7–4.0)
MCH: 32.5 pg (ref 26.0–34.0)
MCHC: 33.8 g/dL (ref 30.0–36.0)
MCV: 96.4 fL (ref 80.0–100.0)
Monocytes Absolute: 0.5 10*3/uL (ref 0.1–1.0)
Monocytes Relative: 11 %
Neutro Abs: 2.9 10*3/uL (ref 1.7–7.7)
Neutrophils Relative %: 69 %
Platelet Count: 239 10*3/uL (ref 150–400)
RBC: 4.12 MIL/uL (ref 3.87–5.11)
RDW: 15.1 % (ref 11.5–15.5)
WBC Count: 4.1 10*3/uL (ref 4.0–10.5)
nRBC: 0 % (ref 0.0–0.2)

## 2023-04-12 MED ORDER — SODIUM CHLORIDE 0.9% FLUSH
10.0000 mL | INTRAVENOUS | Status: DC | PRN
  Administered 2023-04-12: 10 mL

## 2023-04-12 MED ORDER — ACETAMINOPHEN 325 MG PO TABS
650.0000 mg | ORAL_TABLET | Freq: Once | ORAL | Status: AC
  Administered 2023-04-12: 650 mg via ORAL
  Filled 2023-04-12: qty 2

## 2023-04-12 MED ORDER — SODIUM CHLORIDE 0.9% FLUSH
10.0000 mL | Freq: Once | INTRAVENOUS | Status: AC
  Administered 2023-04-12: 10 mL

## 2023-04-12 MED ORDER — SODIUM CHLORIDE 0.9 % IV SOLN
80.0000 mg/m2 | Freq: Once | INTRAVENOUS | Status: AC
  Administered 2023-04-12: 102 mg via INTRAVENOUS
  Filled 2023-04-12: qty 17

## 2023-04-12 MED ORDER — SODIUM CHLORIDE 0.9 % IV SOLN
10.0000 mg | Freq: Once | INTRAVENOUS | Status: AC
  Administered 2023-04-12: 10 mg via INTRAVENOUS
  Filled 2023-04-12: qty 10

## 2023-04-12 MED ORDER — FAMOTIDINE IN NACL 20-0.9 MG/50ML-% IV SOLN
20.0000 mg | Freq: Once | INTRAVENOUS | Status: AC
  Administered 2023-04-12: 20 mg via INTRAVENOUS
  Filled 2023-04-12: qty 50

## 2023-04-12 MED ORDER — TRASTUZUMAB-DKST CHEMO 150 MG IV SOLR
6.0000 mg/kg | Freq: Once | INTRAVENOUS | Status: AC
  Administered 2023-04-12: 210 mg via INTRAVENOUS
  Filled 2023-04-12: qty 10

## 2023-04-12 MED ORDER — TRASTUZUMAB-ANNS CHEMO 150 MG IV SOLR
6.0000 mg/kg | Freq: Once | INTRAVENOUS | Status: DC
  Filled 2023-04-12: qty 10

## 2023-04-12 MED ORDER — SODIUM CHLORIDE 0.9 % IV SOLN: Freq: Once | INTRAVENOUS | Status: AC

## 2023-04-12 MED ORDER — HEPARIN SOD (PORK) LOCK FLUSH 100 UNIT/ML IV SOLN
500.0000 [IU] | Freq: Once | INTRAVENOUS | Status: AC | PRN
  Administered 2023-04-12: 500 [IU]

## 2023-04-12 MED ORDER — DIPHENHYDRAMINE HCL 50 MG/ML IJ SOLN
25.0000 mg | Freq: Once | INTRAMUSCULAR | Status: AC
  Administered 2023-04-12: 25 mg via INTRAVENOUS
  Filled 2023-04-12: qty 1

## 2023-04-12 MED ORDER — SODIUM CHLORIDE 0.9 % IV SOLN
420.0000 mg | Freq: Once | INTRAVENOUS | Status: AC
  Administered 2023-04-12: 420 mg via INTRAVENOUS
  Filled 2023-04-12: qty 14

## 2023-04-12 NOTE — Patient Instructions (Signed)
[facility]  Discharge Instructions: Thank you for choosing [facility] to provide your oncology and hematology care.   If you have a lab appointment with the Cancer Center, please go directly to the Cancer Center and check in at the registration area.   Wear comfortable clothing and clothing appropriate for easy access to any Portacath or PICC line.   We strive to give you quality time with your provider. You may need to reschedule your appointment if you arrive late (15 or more minutes).  Arriving late affects you and other patients whose appointments are after yours.  Also, if you miss three or more appointments without notifying the office, you may be dismissed from the clinic at the provider's discretion.      For prescription refill requests, have your pharmacy contact our office and allow 72 hours for refills to be completed.    Today you received the following chemotherapy and/or immunotherapy agents: Ogivri/Perjeta/Taxol      To help prevent nausea and vomiting after your treatment, we encourage you to take your nausea medication as directed.  BELOW ARE SYMPTOMS THAT SHOULD BE REPORTED IMMEDIATELY: *FEVER GREATER THAN 100.4 F (38 C) OR HIGHER *CHILLS OR SWEATING *NAUSEA AND VOMITING THAT IS NOT CONTROLLED WITH YOUR NAUSEA MEDICATION *UNUSUAL SHORTNESS OF BREATH *UNUSUAL BRUISING OR BLEEDING *URINARY PROBLEMS (pain or burning when urinating, or frequent urination) *BOWEL PROBLEMS (unusual diarrhea, constipation, pain near the anus) TENDERNESS IN MOUTH AND THROAT WITH OR WITHOUT PRESENCE OF ULCERS (sore throat, sores in mouth, or a toothache) UNUSUAL RASH, SWELLING OR PAIN  UNUSUAL VAGINAL DISCHARGE OR ITCHING   Items with * indicate a potential emergency and should be followed up as soon as possible or go to the Emergency Department if any problems should occur.  Please show the CHEMOTHERAPY ALERT CARD or IMMUNOTHERAPY ALERT  CARD at check-in to the Emergency Department and triage nurse.  Should you have questions after your visit or need to cancel or reschedule your appointment, please contact [facility]  Dept: [phone]  and follow the prompts.  Office hours are 8:00 a.m. to 4:30 p.m. Monday - Friday. Please note that voicemails left after 4:00 p.m. may not be returned until the following business day.  We are closed weekends and major holidays. You have access to a nurse at all times for urgent questions. Please call the main number to the clinic Dept: [phone] and follow the prompts.   For any non-urgent questions, you may also contact your provider using MyChart. We now offer e-Visits for anyone 40 and older to request care online for non-urgent symptoms. For details visit [email or web address].   Also download the MyChart app! Go to the app store, search "MyChart", open the app, select [place], and log in with your MyChart username and password.

## 2023-04-13 ENCOUNTER — Encounter: Payer: Self-pay | Admitting: Hematology

## 2023-04-13 ENCOUNTER — Other Ambulatory Visit: Payer: Self-pay

## 2023-04-13 ENCOUNTER — Telehealth: Payer: Self-pay | Admitting: Pharmacy Technician

## 2023-04-13 ENCOUNTER — Encounter (HOSPITAL_COMMUNITY)
Admission: RE | Admit: 2023-04-13 | Discharge: 2023-04-13 | Disposition: A | Discharge status: Home / Self Care | Payer: BC Managed Care – PPO | Source: Ambulatory Visit | Attending: Hematology | Admitting: Hematology

## 2023-04-13 ENCOUNTER — Telehealth: Payer: Self-pay | Admitting: Hematology

## 2023-04-13 ENCOUNTER — Other Ambulatory Visit (HOSPITAL_COMMUNITY): Payer: Self-pay

## 2023-04-13 DIAGNOSIS — C7951 Secondary malignant neoplasm of bone: Secondary | ICD-10-CM | POA: Diagnosis not present

## 2023-04-13 LAB — GLUCOSE, CAPILLARY: Glucose-Capillary: 97 mg/dL (ref 70–99)

## 2023-04-13 MED ORDER — TUCATINIB 150 MG PO TABS
300.0000 mg | ORAL_TABLET | Freq: Two times a day (BID) | ORAL | 0 refills | Status: DC
  Filled 2023-04-13: qty 120, 30d supply, fill #0

## 2023-04-13 MED ORDER — CAPECITABINE 500 MG PO TABS
850.0000 mg/m2 | ORAL_TABLET | Freq: Two times a day (BID) | ORAL | 0 refills | Status: DC
  Filled 2023-04-13: qty 56, 14d supply, fill #0

## 2023-04-13 MED ORDER — FLUDEOXYGLUCOSE F - 18 (FDG) INJECTION
4.9000 | Freq: Once | INTRAVENOUS | Status: AC | PRN
  Administered 2023-04-13: 4.9 via INTRAVENOUS

## 2023-04-13 NOTE — Addendum Note (Signed)
Addended by: Malachy Mood on: 04/13/2023 05:49 PM   Modules accepted: Orders

## 2023-04-13 NOTE — Telephone Encounter (Signed)
Oral Oncology Patient Advocate Encounter   Received notification that prior authorization for capecitabine is required.   PA submitted on 04/13/23 Key [id number] Status is pending     Jinger Neighbors, CPhT-Adv Oncology Pharmacy Patient Advocate [facility] Direct Number: [phone]  Fax: [phone]

## 2023-04-13 NOTE — Telephone Encounter (Signed)
Oral Oncology Patient Advocate Encounter   Received notification that prior authorization for Martha [name] is required.   PA submitted on 04/13/23 Key [id number] Status is pending     Jinger Neighbors, CPhT-Adv Oncology Pharmacy Patient Advocate [facility] Direct Number: [phone]  Fax: [phone]

## 2023-04-13 NOTE — Telephone Encounter (Signed)
Oral Oncology Patient Advocate Encounter  Prior Authorization for capecitabine has been approved.    PA# [id number] Effective dates: 03/14/23 through 04/12/24  Patient must fill at [facility].    Jinger Neighbors, CPhT-Adv Oncology Pharmacy Patient Advocate [facility] Direct Number: [phone]  Fax: [phone]

## 2023-04-16 ENCOUNTER — Other Ambulatory Visit: Payer: Self-pay | Admitting: Radiation Therapy

## 2023-04-16 ENCOUNTER — Telehealth: Payer: Self-pay | Admitting: Pharmacist

## 2023-04-16 ENCOUNTER — Other Ambulatory Visit (HOSPITAL_COMMUNITY): Payer: Self-pay

## 2023-04-16 ENCOUNTER — Telehealth: Payer: Self-pay | Admitting: Pharmacy Technician

## 2023-04-16 ENCOUNTER — Other Ambulatory Visit: Payer: Self-pay

## 2023-04-16 DIAGNOSIS — C7931 Secondary malignant neoplasm of brain: Secondary | ICD-10-CM

## 2023-04-16 DIAGNOSIS — C7951 Secondary malignant neoplasm of bone: Secondary | ICD-10-CM

## 2023-04-16 MED ORDER — TUCATINIB 150 MG PO TABS
300.0000 mg | ORAL_TABLET | Freq: Two times a day (BID) | ORAL | 0 refills | Status: DC
Start: 2023-04-16 — End: 2023-05-08

## 2023-04-16 MED ORDER — TUCATINIB 150 MG PO TABS
300.0000 mg | ORAL_TABLET | Freq: Two times a day (BID) | ORAL | 0 refills | Status: DC
Start: 2023-04-16 — End: 2023-04-16
  Filled 2023-04-16: qty 120, 30d supply, fill #0

## 2023-04-16 MED ORDER — CAPECITABINE 500 MG PO TABS
850.0000 mg/m2 | ORAL_TABLET | Freq: Two times a day (BID) | ORAL | 0 refills | Status: DC
Start: 2023-04-16 — End: 2023-05-01

## 2023-04-16 MED ORDER — TUCATINIB 150 MG PO TABS
300.0000 mg | ORAL_TABLET | Freq: Two times a day (BID) | ORAL | 0 refills | Status: DC
Start: 2023-04-16 — End: 2023-04-16

## 2023-04-16 MED ORDER — CAPECITABINE 500 MG PO TABS
850.0000 mg/m2 | ORAL_TABLET | Freq: Two times a day (BID) | ORAL | 0 refills | Status: DC
  Filled 2023-04-16: qty 56, 14d supply, fill #0

## 2023-04-16 NOTE — Assessment & Plan Note (Signed)
-  Diagnosed in 06/2022 --PET scan from 08/03/2022 showed diffuse bone mets  -she underwent cervical laminectomy and fusion by Dr. Maisie Fus on 08/17/2022, biopsy confirmed metastatic breast cancer, ER/PR negative, HER2 positive. -due to cervical cord compression with myelopathy, she underwent a second cervical spine surgery on September 06, 2022, and completed inpatient rehabitation. -She has completed brain, cervical and lumbar spine radiation in Feb 2024 -She was recently hospitalized for altered mental status and confusion, probably secondary to medication -She started first line systemic chemotherapy with weekly Taxol and trastuzumab/Perjeta on 10/25/22. She has tolerated well so far and her pain has improved overall -PET scan from Jan 01, 2023 showed complete metabolic response, no hypermetabolic uptake in diffuse bone lesions, no other new lesions.  She is clinically also doing much better.  Given her excellent response to chemo, we will change her Taxol to 2 weeks on and 1 week off -She Unfortunately developed more more brain metastasis on recent brain MRI. -I personally reviewed her restaging PET scan from April 13, 2023, which showed stable disease, no new lesions. -Due to her new brain metastasis, I plan to change her email to Tucatinib, capecitabine and trastuzumab, for better brain penetration.  Her case was reviewed in CNS tumor board yesterday, Dr. Basilio Cairo plan to hold on whole brain radiation since she is starting new Her2 antibody and monitor her brain mets closely.  -continue zometa every 3 months

## 2023-04-16 NOTE — Telephone Encounter (Signed)
Oral Oncology Pharmacist Encounter   Patient's insurance requires them to fill through [facility]. Matilde Haymaker and Xeloda prescriptions have been redirected for dispensing.    Oral Chemotherapy Clinic will follow up with outside pharmacy to ensure Rx is delivered.    Lenord Carbo, PharmD, BCPS, [facility] Hematology/Oncology Clinical Pharmacist [facility] Oral Chemotherapy Navigation Clinics [phone] 04/16/2023 10:43 AM

## 2023-04-16 NOTE — Telephone Encounter (Signed)
Oral Oncology Patient Advocate Encounter   Was successful in obtaining a copay card for [facility].  This copay card will make the patients copay $0.  The billing information is as follows and has been shared with [facility].   RxBin: [id number] Member ID: [id number] Group ID: [id number]   Jinger Neighbors, CPhT-Adv Oncology Pharmacy Patient Advocate [facility] Direct Number: [phone]  Fax: [phone]

## 2023-04-16 NOTE — Assessment & Plan Note (Signed)
--  She was diagnosed in 12/2017. She is s/p left breast lumpectomy and adjuvant radiation.  -She started anti-estrogen therapy with letrozole on 05/2018. Tolerating well with no issues -lost f/u after visit in 04/2020 until her recurrence in 07/2022

## 2023-04-16 NOTE — Telephone Encounter (Signed)
Oral Oncology Pharmacist Encounter  Received new prescription for Tukysa (tucatinib) and Xeloda (capecitabine) for the treatment of metastatic ER/PR negative, HER-2 positive breast cancer in conjunction with trastuzumab, planned duration until disease progression or unacceptable drug toxicity.  CBC w/ Diff and CMP from 04/12/23 assessed, no relevant lab abnormalities requiring baseline dose adjustment required at this time. Prescription dose and frequency assessed for appropriateness.  Current medication list in Epic reviewed, DDIs with Xeloda and Tukysa identified: Category C drug-drug interaction between Xeloda and Citalopram due to risk of Qtc prolongation - no changes in therapy required at this time. (Noted last EKG available from 10/13/22 and Qtc was at this time). Category C drug-drug interaction between [place] and Oxycodone - Tukysa, a strong CYP3A4 inhibitor, may increase side effects from oxycodone. Recommend monitoring patient for increased fatigue/lethargy. No changes in therapy required at this time.   Evaluated chart and no patient barriers to medication adherence noted.   Patient agreement for treatment documented in MD note on 04/12/23.  Prescription has been e-scribed to the [facility] for benefits analysis and approval.  Oral Oncology Clinic will continue to follow for insurance authorization, copayment issues, initial counseling and start date.  Lenord Carbo, PharmD, BCPS, BCOP Hematology/Oncology Clinical Pharmacist [facility] Oral Chemotherapy Navigation Clinics [phone] 04/16/2023 8:21 AM

## 2023-04-16 NOTE — Telephone Encounter (Signed)
Oral Oncology Patient Advocate Encounter  Prior Authorization for Martha [name] has been approved.    PA# [id number] Effective dates: 03/17/23 through 04/16/23  Patient must fill at [facility].    Jinger Neighbors, CPhT-Adv Oncology Pharmacy Patient Advocate [facility] Direct Number: [phone]  Fax: [phone]

## 2023-04-16 NOTE — Telephone Encounter (Signed)
Oral Oncology Pharmacist Encounter   Notified by [facility] they do not have access to [facility] (however will still be the ones to fill patient's Xeloda). Matilde Haymaker as been redirected to [facility] for dispensing. .    Oral Chemotherapy Clinic will follow up with outside pharmacy to ensure Rx is delivered.    Lenord Carbo, PharmD, BCPS, [facility] Hematology/Oncology Clinical Pharmacist [facility] Oral Chemotherapy Navigation Clinics [phone] 04/16/2023 2:31 PM

## 2023-04-17 ENCOUNTER — Encounter: Payer: Self-pay | Admitting: Hematology

## 2023-04-17 ENCOUNTER — Inpatient Hospital Stay (HOSPITAL_BASED_OUTPATIENT_CLINIC_OR_DEPARTMENT_OTHER): Payer: BC Managed Care – PPO | Admitting: Hematology

## 2023-04-17 DIAGNOSIS — C7951 Secondary malignant neoplasm of bone: Secondary | ICD-10-CM

## 2023-04-17 DIAGNOSIS — Z170 Estrogen receptor positive status [ER+]: Secondary | ICD-10-CM | POA: Diagnosis not present

## 2023-04-17 DIAGNOSIS — C50312 Malignant neoplasm of lower-inner quadrant of left female breast: Secondary | ICD-10-CM

## 2023-04-17 NOTE — Progress Notes (Signed)
[facility]   Telephone:([phone]) [phone] Fax:(336) [phone]   Clinic Follow up Note   Patient Care Team: Ardith Dark, MD as PCP - General (Family Medicine) Almond Lint, MD as Consulting Physician (General Surgery) Malachy Mood, MD as Consulting Physician (Hematology) Lonie Peak, MD as Attending Physician (Radiation Oncology) Pollyann Samples, NP as Nurse Practitioner (Nurse Practitioner)  Date of Service:  04/17/2023  I connected with Martha [name] on 04/17/2023 at  9:40 AM EDT by telephone visit and verified that I am speaking with the correct person using two identifiers.  I discussed the limitations, risks, security and privacy concerns of performing an evaluation and management service by telephone and the availability of in person appointments. I also discussed with the patient that there may be a patient responsible charge related to this service. The patient expressed understanding and agreed to proceed.   Other persons participating in the visit and their role in the encounter:  Husband  Patient's location:  Home Provider's location:  [facility] office  CHIEF COMPLAINT: f/u of metastatic breast cancer   CURRENT THERAPY:  -Zometa q3 months -plan to start Tucatinib and capecitabine later this week, continue trastuzumab every 3 weeks  ASSESSMENT & PLAN:  Martha [name] is a 62 y.o. female with    Metastatic breast cancer to bone and brain -Diagnosed in 06/2022 --PET scan from 08/03/2022 showed diffuse bone mets  -she underwent cervical laminectomy and fusion by Dr. Maisie Fus on 08/17/2022, biopsy confirmed metastatic breast cancer, ER/PR negative, HER2 positive. -due to cervical cord compression with myelopathy, she underwent a second cervical spine surgery on September 06, 2022, and completed inpatient rehabitation. -She has completed brain, cervical and lumbar spine radiation in Feb 2024 -She was recently hospitalized for altered mental status and  confusion, probably secondary to medication -She started first line systemic chemotherapy with weekly Taxol and trastuzumab/Perjeta on 10/25/22. She has tolerated well so far and her pain has improved overall -PET scan from Jan 01, 2023 showed complete metabolic response, no hypermetabolic uptake in diffuse bone lesions, no other new lesions.  She is clinically also doing much better.  Given her excellent response to chemo, we will change her Taxol to 2 weeks on and 1 week off -She Unfortunately developed more more brain metastasis on recent brain MRI. -I personally reviewed her restaging PET scan from April 13, 2023, which showed stable disease, no new lesions. -Due to her new brain metastasis, I plan to change her email to Tucatinib, capecitabine and trastuzumab, for better brain penetration.  Her case was reviewed in CNS tumor board yesterday, Dr. Basilio Cairo plan to hold on whole brain radiation since she is starting new Her2 antibody and monitor her brain mets closely.  -continue zometa every 3 months     Malignant neoplasm of lower-inner quadrant of left breast in female, estrogen receptor positive ([name]) She was diagnosed in 12/2017. She is s/p left breast lumpectomy and adjuvant radiation.  -She started anti-estrogen therapy with letrozole on 05/2018. Tolerating well with no issues -lost f/u after visit in 04/2020 until her recurrence in 07/2022    PLAN: -I reviewed PET/CT scan with pt - I change chemo regimen to Tucatinib, capecitabine and trastuzumab, she was started Tucatinib and capecitabine as soon as she receives the medicine later this week -lab/flush, f/u and trastuzumab on September 5   SUMMARY OF ONCOLOGIC HISTORY: Oncology History Overview Note  Cancer Staging Malignant neoplasm of lower-inner quadrant of left breast in female, estrogen receptor positive (  [name]) Staging form: Breast, AJCC 8th Edition - Clinical stage from 01/16/2018: Stage IA (cT1c, cN0, cM0, G2, ER+, PR+, HER2-) -  Signed by Malachy Mood, MD on 01/23/2018 - Pathologic: Stage IA (pT1c, pN1, cM0, G1, ER+, PR+, HER2-) - Signed by Lonie Peak, MD on 04/09/2018     Malignant neoplasm of lower-inner quadrant of left breast in female, estrogen receptor positive ([name])  01/15/2018 Mammogram   Diagnositc Mammogram 01/15/18  IMPRESSION: 1. Suspicious 1.2 x 1.4 x 1.3 cm mixed echogenicity mass left breast 7 o'clock position retroareolar location at the site of palpable concern.. 2. Indeterminate Within the left breast 7:30 o'clock retroareolar location, adjacent to the palpable mass, is a 0.5 x 0.4 x 0.5 cm oval circumscribed hypoechoic mass. 3. Indeterminate calcifications within the lateral left breast. Location of these calcifications is not definitely confirmed on the true lateral view.    01/16/2018 Initial Biopsy   Diagnosis 01/16/18 1. Breast, left, needle core biopsy, 7:30 o'clock (ribbon clip) - FIBROCYSTIC CHANGES WITH SCLEROSING ADENOSIS AND CALCIFICATIONS. - FIBROADENOMATOID CHANGE. - NO MALIGNANCY IDENTIFIED. 2. Breast, left, needle core biopsy, 7 o'clock position (coil clip) - INVASIVE MAMMARY CARCINOMA, MSBR GRADE I/II. - SEE MICROSCOPIC DESCRIPTION Microscopic Comment  ADDENDUM: Immunohistochemistry for E-Cadherin is strongly positive in the tumor consistent with ductal carcinoma. (JDP:ah 01/17/18)   01/16/2018 Receptors her2   Estrogen Receptor: 100%, POSITIVE, STRONG STAINING INTENSITY Progesterone Receptor: 50%, POSITIVE, STRONG STAINING INTENSITY Proliferation Marker Ki67: 20% HER2 Negative   01/16/2018 Cancer Staging   Staging form: Breast, AJCC 8th Edition - Clinical stage from 01/16/2018: Stage IA (cT1c, cN0, cM0, G2, ER+, PR+, HER2-) - Signed by Malachy Mood, MD on 01/23/2018   01/22/2018 Initial Diagnosis   Malignant neoplasm of lower-inner quadrant of left breast in female, estrogen receptor positive ([name])   02/21/2018 Surgery    LEFT BREAST LUMPECTOMY WITH AXILLARY LYMPH NODE  BIOPSY by Dr. Donell Beers  02/21/18   02/21/2018 Pathology Results   Diagnosis 02/21/18 1. Breast, lumpectomy, Left - INVASIVE DUCTAL CARCINOMA, GRADE I, 1.6 CM. - DUCTAL CARCINOMA IN SITU, INTERMEDIATE NUCLEAR GRADE. - ANTERIOR AND MEDIAL RESECTION MARGINS ARE POSITIVE FOR CARCINOMA. - NEGATIVE FOR LYMPHOVASCULAR OR PERINEURAL INVASION. - BACKGROUND BREAST TISSUE WITH FIBROCYSTIC CHANGE, INCLUDING SCLEROSING ADENOSIS. - BIOPSY SITE CHANGES. - SEE ONCOLOGY TABLE. 2. Lymph node, sentinel, biopsy, Left Axillary #1 - METASTATIC BREAST CARCINOMA TO A LYMPH NODE, 1.0 CM IN GREATEST DIMENSION, WITH EXTRANODAL EXTENSION (1/1). 3. Lymph node, sentinel, biopsy, Left Axillary #2 - LYMPH NODE, NEGATIVE FOR CARCINOMA (0/1).    02/21/2018 Miscellaneous   Mammaprint 02/21/18 Low Risk with 10-year risk of recurrnce at 10% -No potential signifcant chemotherapy benefit   03/20/2018 Pathology Results   RE-EXCISION OF BREAST LUMPECTOMY by Dr. Donell Beers  Diagnosis 03/20/18 1. Breast, excision, Left new anterior margin - FIBROCYSTIC CHANGES WITH ADENOSIS AND CALCIFICATIONS. - HEALING BIOPSY SITE. - THERE IS NO EVIDENCE OF MALIGNANCY. 2. Breast, excision, Left new medial margin - FIBROCYSTIC CHANGES WITH ADENOSIS AND CALCIFICATIONS. - HEALING BIOPSY SITE. - THERE IS NO EVIDENCE OF MALIGNANCY. Microscopic Comment 1. -2. The surgical resection margin(s) of the specimen were inked and microscopically evaluated. (JBK:kh 03-22-18)   04/09/2018 Cancer Staging   Staging form: Breast, AJCC 8th Edition - Pathologic: Stage IA (pT1c, pN1, cM0, G1, ER+, PR+, HER2-) - Signed by Lonie Peak, MD on 04/09/2018   04/22/2018 - 06/03/2018 Radiation Therapy   Radaiton with Dr. Basilio Cairo 04/22/18-06/03/18   05/2018 -  Anti-estrogen oral therapy   Letrozole 2.5mg  started  05/2018    Survivorship   Per Santiago Glad, NP    05/04/2022 Imaging    IMPRESSION: Cervical spondylosis, as described.   Nonspecific straightening of the  expected cervical lordosis.   07/24/2022 Imaging    IMPRESSION: 1. Extensive osseous metastatic disease with pathologic fracture at the base of dens and C2 right lateral mass. Extraosseous tumor at C1 and C2 likely impinging on the right C2 and C3 nerve roots. 2. Degenerative cord impingement at C4-5 to C6-7. Biforaminal impingement at C5-6 and C6-7.   08/03/2022 PET scan    IMPRESSION: 1. Large volume osseous metastasis. 2. Low right cervical and probable right axillary nodal metastasis. 3. Subtle heterogeneous activity throughout the liver with suggestion of small liver lesions (likely new compared to chest CT of 07/16/2020). Findings are overall moderately suspicious for hepatic metastasis. Pre and post contrast abdominal MRI (preferred) or CT could confirm. 4. Right-sided pleural thickening and trace pleural fluid. Right base airspace disease is favored to represent chronic atelectasis. 5. Aortic atherosclerosis (ICD10-I70.0) and emphysema (ICD10-J43.9).     08/11/2022 Genetic Testing   Negative genetic testing on the CancerNext-Expanded+RNAinsight panel.  The report date is August 11, 2022.  The CancerNext-Expanded gene panel offered by [facility] and includes sequencing and rearrangement analysis for the following 77 genes: AIP, ALK, APC*, ATM*, AXIN2, BAP1, BARD1, BLM, BMPR1A, BRCA1*, BRCA2*, BRIP1*, CDC73, CDH1*, CDK4, CDKN1B, CDKN2A, CHEK2*, CTNNA1, DICER1, FANCC, FH, FLCN, GALNT12, KIF1B, LZTR1, MAX, MEN1, MET, MLH1*, MSH2*, MSH3, MSH6*, MUTYH*, NBN, NF1*, NF2, NTHL1, PALB2*, PHOX2B, PMS2*, POT1, PRKAR1A, PTCH1, PTEN*, RAD51C*, RAD51D*, RB1, RECQL, RET, SDHA, SDHAF2, SDHB, SDHC, SDHD, SMAD4, SMARCA4, SMARCB1, SMARCE1, STK11, SUFU, TMEM127, TP53*, TSC1, TSC2, VHL and XRCC2 (sequencing and deletion/duplication); EGFR, EGLN1, HOXB13, KIT, MITF, PDGFRA, POLD1, and POLE (sequencing only); EPCAM and GREM1 (deletion/duplication only). DNA and RNA analyses performed for * genes.     Lobular carcinoma in situ (LCIS) of right breast  01/20/2020 Mammogram   Diagnostic Mammogram 01/20/20 IMPRESSION: 1.  Stable post lumpectomy changes of the left breast.   2. Suspicious microcalcifications over the right upper outer quadrant spanning 3.6 cm.   02/02/2020 Initial Biopsy   Diagnosis 02/02/20 Breast, right, needle core biopsy, upper outer quadrant, x clip - LOBULAR CARCINOMA IN SITU WITH PLEOMORPHIC FEATURES AND CALCIFICATIONS, INVOLVING ADENOSIS. SEE NOTE Diagnosis Note Immunohistochemical stain for E-cadherin is negative in the lesional cells, consistent with a lobular phenotype. Immunostains for p63, SMM 1 and calponin do not show evidence of invasive carcinoma.    02/04/2020 Initial Diagnosis   Lobular carcinoma in situ (LCIS) of right breast   03/18/2020 Surgery   RIGHT BREAST LUMPECTOMY WITH RADIOACTIVE SEED LOCALIZATION by Dr Alvira Monday    03/18/2020 Pathology Results   FINAL MICROSCOPIC DIAGNOSIS:   A. BREAST, RIGHT, LUMPECTOMY:  - Pleomorphic lobular carcinoma in situ with calcifications and  underlying complex sclerosing lesion, adenosis and fibroadenomatoid  change.  - Margins of resection are not involved (Closest margins: < 1 mm,  anterior, posterior, inferior and medial).  - Biopsy site.    COMMENT:   P63, Calponin and SMM-1 demonstrate the presence of myoepithelium in the  select focus.    Metastatic breast cancer to bone and brain  05/04/2022 Imaging    IMPRESSION: Cervical spondylosis, as described.   Nonspecific straightening of the expected cervical lordosis.   07/24/2022 Imaging    IMPRESSION: 1. Extensive osseous metastatic disease with pathologic fracture at the base of dens and C2 right lateral mass. Extraosseous tumor at C1 and  C2 likely impinging on the right C2 and C3 nerve roots. 2. Degenerative cord impingement at C4-5 to C6-7. Biforaminal impingement at C5-6 and C6-7.   08/02/2022 Initial Diagnosis   Cancer, metastatic to bone  ([name])   08/03/2022 PET scan    IMPRESSION: 1. Large volume osseous metastasis. 2. Low right cervical and probable right axillary nodal metastasis. 3. Subtle heterogeneous activity throughout the liver with suggestion of small liver lesions (likely new compared to chest CT of 07/16/2020). Findings are overall moderately suspicious for hepatic metastasis. Pre and post contrast abdominal MRI (preferred) or CT could confirm. 4. Right-sided pleural thickening and trace pleural fluid. Right base airspace disease is favored to represent chronic atelectasis. 5. Aortic atherosclerosis (ICD10-I70.0) and emphysema (ICD10-J43.9).     10/26/2022 - 04/12/2023 Chemotherapy   Patient is on Treatment Plan : BREAST Paclitaxel D1,8,15 + Trastuzumab D1 + Pertuzumab D1 q21d x 8 cycles / Trastuzumab D1 + Pertuzumab D1 q21d x 4 cycles     05/03/2023 -  Chemotherapy   Patient is on Treatment Plan : BREAST MAINTENANCE Trastuzumab IV (6) or SQ (600) D1 q21d X 11 Cycles        INTERVAL HISTORY:  Martha [name] was contacted for a follow up of metastatic breast cancer . She was last seen by me on 04/12/2023. Pt state that    All other systems were reviewed with the patient and are negative.  MEDICAL HISTORY:  Past Medical History:  Diagnosis Date   Anxiety    Cancer ([name]) 2022   right breast LCIS   Cancer ([name]) 2019   left breast   Depression    Dysrhythmia    SVT, s/p ablation ~ 2012 in at [facility]   Ectopic pregnancy    Family history of breast cancer    Family history of melanoma    History of radiation therapy 04/22/18-06/03/18   Left Breast, left SCV, axilla 50 Gy in 25 fractions, Left breast boost 10 Gy in 5 fractions.    Hypothyroidism    Personal history of radiation therapy    PONV (postoperative nausea and vomiting)    Thyroid disease     SURGICAL HISTORY: Past Surgical History:  Procedure Laterality Date   APPENDECTOMY     BILATERAL SALPINGECTOMY     BREAST  BIOPSY Right 2014   fibroadenoma   BREAST BIOPSY Left 01/16/2018   BREAST BIOPSY Right 02/02/2020   LCIS   BREAST BIOPSY Right 08/04/2020   x2 LCIS   BREAST BIOPSY Right 08/13/2020   x2   BREAST EXCISIONAL BIOPSY Left 1990   benign   BREAST EXCISIONAL BIOPSY Right 02/02/2020   LCIS   BREAST LUMPECTOMY Left 01/22/2018   BREAST LUMPECTOMY WITH AXILLARY LYMPH NODE BIOPSY Left 02/21/2018   Procedure: LEFT BREAST LUMPECTOMY WITH AXILLARY LYMPH NODE BIOPSY;  Surgeon: Almond Lint, MD;  Location: [facility] OR;  Service: General;  Laterality: Left;   BREAST LUMPECTOMY WITH RADIOACTIVE SEED LOCALIZATION Right 03/18/2020   Procedure: RIGHT BREAST LUMPECTOMY WITH RADIOACTIVE SEED LOCALIZATION;  Surgeon: Almond Lint, MD;  Location: [facility] OR;  Service: General;  Laterality: Right;   BREAST SURGERY Left 1993   cyst removed   ENDOMETRIAL ABLATION     EYE SURGERY     GLAUCOMA SURGERY Bilateral    IR IMAGING GUIDED PORT INSERTION  10/16/2022   POSTERIOR CERVICAL FUSION/FORAMINOTOMY N/A 08/17/2022   Procedure: Cervical One-Cervical Four POSTERIOR CERVICAL FUSION,  Cervical One LAMINECTOMY REDUCTION OF Cervical Two Fracture, Biopsy of Right  Cerical Two Pars  Lesion;  Surgeon: Bedelia Person, MD;  Location: [facility] OR;  Service: Neurosurgery;  Laterality: N/A;   POSTERIOR CERVICAL FUSION/FORAMINOTOMY N/A 09/06/2022   Procedure: Posterior Cervical Fusion, Foraminotomy , Cervical Five-Six, Cervical Six-Seven; Extension of  fusion Cervical Four- Thoracic One;  Surgeon: Bedelia Person, MD;  Location: [facility] OR;  Service: Neurosurgery;  Laterality: N/A;   RADIOACTIVE SEED GUIDED EXCISIONAL BREAST BIOPSY Right 04/28/2021   Procedure: RADIOACTIVE SEED GUIDED EXCISIONAL RIGHT BREAST BIOPSY X2;  Surgeon: Almond Lint, MD;  Location: [facility];  Service: General;  Laterality: Right;   RE-EXCISION OF BREAST LUMPECTOMY Left 03/20/2018   Procedure: RE-EXCISION OF BREAST LUMPECTOMY;  Surgeon: Almond Lint, MD;   Location: [facility];  Service: General;  Laterality: Left;    I have reviewed the social history and family history with the patient and they are unchanged from previous note.  ALLERGIES:  is allergic to morphine and codeine.  MEDICATIONS:  Current Outpatient Medications  Medication Sig Dispense Refill   acetaminophen (TYLENOL) 500 MG tablet Take 2 tablets (1,000 mg total) by mouth every 8 (eight) hours. 30 tablet 0   capecitabine (XELODA) 500 MG tablet Take 2 tablets (1,000 mg total) by mouth 2 (two) times daily after a meal. Take for 7 days on, then off for 7 days. Repeat every 14 days. 56 tablet 0   citalopram (CELEXA) 40 MG tablet Take 1 tablet (40 mg total) by mouth daily. 30 tablet 3   diphenoxylate-atropine (LOMOTIL) 2.5-0.025 MG tablet Take 1 tablet by mouth 4 (four) times daily as needed for diarrhea or loose stools. 60 tablet 0   levothyroxine (SYNTHROID) 75 MCG tablet Take 1 tablet (75 mcg total) by mouth daily before breakfast. 30 tablet 0   lidocaine (XYLOCAINE) 2 % solution Patient: Mix 1part 2% viscous lidocaine, 1part H20. Swallow 10mL of diluted mixture, before meals and at bedtime, up to QID 100 mL 3   Loperamide HCl (IMODIUM PO) Take by mouth as needed.     LORazepam (ATIVAN) 0.5 MG tablet Take 1 tablet (0.5 mg total) by mouth every 6 (six) hours as needed for anxiety (agitation). 30 tablet 0   Mouthwashes (MOUTH RINSE) LIQD solution 15 mLs by Mouth Rinse route as needed (for oral care).  0   nicotine (NICODERM CQ - DOSED IN MG/24 HR) 7 mg/24hr patch Place 1 patch (7 mg total) onto the skin daily. 28 patch 0   ondansetron (ZOFRAN) 8 MG tablet Take 1 tablet (8 mg total) by mouth every 8 (eight) hours as needed for nausea or vomiting. 20 tablet 1   OVER THE COUNTER MEDICATION Take 1 tablet by mouth daily. Protandim Supplement     oxyCODONE (OXY IR/ROXICODONE) 5 MG immediate release tablet Take 0.5-1 tablets (2.5-5 mg total) by mouth every 4 (four) hours as  needed for moderate pain or severe pain (dyspnea). 30 tablet 0   pregabalin (LYRICA) 25 MG capsule Take 1 capsule (25 mg total) by mouth 2 (two) times daily. 60 capsule 2   senna-docusate (SENOKOT-S) 8.6-50 MG tablet Take 1 tablet by mouth 2 (two) times daily. 60 tablet 0   tucatinib (TUKYSA) 150 MG tablet Take 2 tablets (300 mg total) by mouth 2 (two) times daily. Take as instructed per MD. 120 tablet 0   No current facility-administered medications for this visit.    PHYSICAL EXAMINATION: ECOG PERFORMANCE STATUS: 2 - Symptomatic, <50% confined to bed  There were no vitals filed for  this visit. Wt Readings from Last 3 Encounters:  04/12/23 78 lb (35.4 kg)  04/10/23 79 lb 9.6 oz (36.1 kg)  03/29/23 79 lb (35.8 kg)     No vitals taken today, Exam not performed today  LABORATORY DATA:  I have reviewed the data as listed    Latest Ref Rng & Units 04/12/2023    8:00 AM 03/29/2023    7:53 AM 03/22/2023    7:39 AM  CBC  WBC 4.0 - 10.5 K/uL 4.1  4.3  5.0   Hemoglobin 12.0 - 15.0 g/dL 40.3  47.4  25.9   Hematocrit 36.0 - 46.0 % 39.7  38.1  37.5   Platelets 150 - 400 K/uL 239  234  233         Latest Ref Rng & Units 04/12/2023    8:00 AM 03/29/2023    7:53 AM 03/22/2023    7:39 AM  CMP  Glucose 70 - 99 mg/dL 563  875  90   BUN 8 - 23 mg/dL 11  9  5    Creatinine 0.44 - 1.00 mg/dL <6.43  3.29  5.18   Sodium 135 - 145 mmol/L 140  140  140   Potassium 3.5 - 5.1 mmol/L 3.7  3.7  3.5   Chloride 98 - 111 mmol/L 106  106  104   CO2 22 - 32 mmol/L 27  29  30    Calcium 8.9 - 10.3 mg/dL 8.6  9.0  8.8   Total Protein 6.5 - 8.1 g/dL 6.2  6.0  5.5   Total Bilirubin 0.3 - 1.2 mg/dL 0.4  0.5  0.5   Alkaline Phos 38 - 126 U/L 106  92  96   AST 15 - 41 U/L 27  17  13    ALT 0 - 44 U/L 21  13  12        RADIOGRAPHIC STUDIES: I have personally reviewed the radiological images as listed and agreed with the findings in the report. No results found.    No orders of the defined types were placed  in this encounter.  All questions were answered. The patient knows to call the clinic with any problems, questions or concerns. No barriers to learning was detected. The total time spent in the appointment was 15 minutes.     Malachy Mood, MD 04/17/2023   Carolin Coy am acting as scribe for Malachy Mood, MD.   I have reviewed the above documentation for accuracy and completeness, and I agree with the above.

## 2023-04-19 NOTE — Telephone Encounter (Signed)
Oral Oncology Patient Advocate Encounter  Called and left vm with patient regarding Tukysa prescription.   Patient is required by their insurance plan to enroll in secondary coverage service with [employer] [phone]) before the pharmacy will be able to process her medication.  I left a vm with the patient's spouse, [name], as well.   Jinger Neighbors, CPhT-Adv Oncology Pharmacy Patient Advocate [facility] Direct Number: [phone]  Fax: [phone]

## 2023-04-20 NOTE — Telephone Encounter (Addendum)
Oral Chemotherapy Pharmacist Encounter  I spoke with patient for overview of new medication regimen consisting of Tukysa (tucatinib) and Xeloda (capecitabine) for the treatment of metastatic ER/PR negative, HER-2 positive breast cancer in conjunction with trastuzumab, planned duration until disease progression or unacceptable drug toxicity.   Counseled patient on administration, dosing, side effects, monitoring, drug-food interactions, safe handling, storage, and disposal.  Patient will take Xeloda 500mg  tablets, 2 tablets (1000mg ) by mouth in AM and 2 tabs (1000mg ) by mouth in PM within 30 minutes after meals. Take for 7 days on, then off for 7 days. Repeat every 14 days.   Patient will take Tukysa 150 mg tablets, 1 tablet (150 mg total) by mouth twice daily for 7 days. If tolerated, patient will increase to 1 tablet (150 mg total) by mouth in AM and 2 tablets (300 mg total) by mouth in PM for the next 7 days. If tolerated, patient will then increase to goal dose of 2 tablets (300 mg total) by mouth twice daily thereafter.   Trastuzumab will be administered at standard dosing IV every 3 weeks.   Xeloda start date: 04/23/23 (Xeloda is delivering from [facility] to patient's home on 04/21/23) Martha [name] start date: patient will start once she receives - anticipate start date of Tukysa being ~04/25/23 Martha [name] is being filled through [facility])  Adverse effects of Xeloda include but are not limited to: fatigue, decreased blood counts, GI upset, diarrhea, mouth sores, and hand-foot syndrome.  Adverse effects of Tukysa include but are not limited to: palmar-plantar erythrodysesthesia, increased liver enzymes, nausea, vomiting, mouth sores, decreased appetite, diarrhea, and electrolyte abnormalities.  Hand-foot syndrome: discussed use of cream such as Udderly Smooth Extra Care 20 or equivalent advanced care cream that has 20% urea content for advanced skin hydration while on  Xeloda and Tukysa Diarrhea: Patient has Imodium (loperamide) as well as Lomotil on hand if they experience diarrhea. Patient knows to alert the office of 4 or more loose stools above baseline. Nausea/Vomiting: Patient has antiemetic available to use PRN if she experiences N/V  Reviewed with patient importance of keeping a medication schedule and plan for any missed doses. No barriers to medication adherence identified.  Medication reconciliation performed and medication/allergy list updated.  All questions answered.  Martha [name] voiced understanding and appreciation.   Medication education handout placed in mail for patient. Patient knows to call the office with questions or concerns. Oral Chemotherapy Clinic phone number provided to patient.   Lenord Carbo, PharmD, BCPS, [facility] Hematology/Oncology Clinical Pharmacist [facility] Oral Chemotherapy Navigation Clinics [phone] 04/20/2023 2:32 PM

## 2023-04-23 ENCOUNTER — Other Ambulatory Visit: Payer: Self-pay | Admitting: Nurse Practitioner

## 2023-04-23 ENCOUNTER — Other Ambulatory Visit: Payer: Self-pay | Admitting: Physical Medicine and Rehabilitation

## 2023-04-23 DIAGNOSIS — S12191A Other nondisplaced fracture of second cervical vertebra, initial encounter for closed fracture: Secondary | ICD-10-CM | POA: Diagnosis not present

## 2023-04-23 DIAGNOSIS — R197 Diarrhea, unspecified: Secondary | ICD-10-CM

## 2023-04-23 DIAGNOSIS — C7951 Secondary malignant neoplasm of bone: Secondary | ICD-10-CM

## 2023-04-23 DIAGNOSIS — Z515 Encounter for palliative care: Secondary | ICD-10-CM

## 2023-04-23 DIAGNOSIS — Z681 Body mass index (BMI) 19 or less, adult: Secondary | ICD-10-CM | POA: Diagnosis not present

## 2023-05-01 ENCOUNTER — Other Ambulatory Visit: Payer: Self-pay | Admitting: Hematology

## 2023-05-01 DIAGNOSIS — C7951 Secondary malignant neoplasm of bone: Secondary | ICD-10-CM

## 2023-05-02 ENCOUNTER — Other Ambulatory Visit: Payer: Self-pay

## 2023-05-02 ENCOUNTER — Telehealth: Payer: Self-pay | Admitting: Pharmacist

## 2023-05-02 ENCOUNTER — Encounter: Payer: Self-pay | Admitting: Hematology

## 2023-05-02 NOTE — Telephone Encounter (Signed)
Oral Chemotherapy Pharmacist Encounter   Spoke with patient today to follow up regarding patient's oral chemotherapy medication: Xeloda (capecitabine) and Tukysa (tucatinib)  Original Start date of Xeloda (capecitabine) AND Tukysa (tucatinib): 04/25/23  Patient states the first week of therapy she took capecitabine 1 tablet (500 mg total) by mouth twice daily for 7 days. Patient was intended to take 2 tablets (1000 mg total) by mouth twice daily for 7 days on, followed by 7 days off. Patient re-counseled on this and starting 05/09/23 she will increase to her intended capecitabine dose of 2 tablets (1000 mg total) by mouth twice daily for 7 days on, followed by 7 days off.   When following up with patient regarding the Matilde Haymaker, she states she only took 1 tablet (150 mg total) by mouth daily the first week of therapy. She was originally intended to start on dose titration at 1 tablet (150 mg total) by mouth twice daily for 7 days, then if tolerated increase to 1 tablet (150 mg total) by mouth in AM and 2 tablets (300 mg total) by mouth in PM for the next 7 days, and then if tolerated increase to goal dose of 2 tablets (300 mg total) by mouth twice daily (continuously) thereafter. Patient re-educated on the Tukysa titration schedule and she will increase to 1 tablet (150 mg total) by mouth twice daily for 7 days starting today (05/02/23).  Confirmed patient's email ([email or web address]) and will send detailed medication calendar for patient to have to aid with titration schedule of Tukysa as well as on/off weeks of Xeloda.   Patient expressed appreciation and understanding. Patient knows to call the office with questions or concerns.  Lenord Carbo, PharmD, BCPS, BCOP Hematology/Oncology Clinical Pharmacist [facility] Oral Chemotherapy Navigation Clinics [phone] 05/02/2023 1:14 PM

## 2023-05-03 ENCOUNTER — Other Ambulatory Visit: Payer: Self-pay

## 2023-05-03 ENCOUNTER — Ambulatory Visit: Payer: BC Managed Care – PPO | Admitting: Hematology

## 2023-05-03 ENCOUNTER — Inpatient Hospital Stay: Payer: BC Managed Care – PPO

## 2023-05-03 ENCOUNTER — Other Ambulatory Visit: Payer: BC Managed Care – PPO

## 2023-05-03 ENCOUNTER — Inpatient Hospital Stay (HOSPITAL_BASED_OUTPATIENT_CLINIC_OR_DEPARTMENT_OTHER): Payer: BC Managed Care – PPO | Admitting: Hematology

## 2023-05-03 ENCOUNTER — Inpatient Hospital Stay: Payer: BC Managed Care – PPO | Attending: Genetic Counselor

## 2023-05-03 ENCOUNTER — Encounter: Payer: Self-pay | Admitting: Hematology

## 2023-05-03 ENCOUNTER — Ambulatory Visit: Payer: BC Managed Care – PPO

## 2023-05-03 VITALS — BP 120/73 | HR 86 | Temp 97.6°F | Resp 16 | Ht 62.0 in | Wt 78.7 lb

## 2023-05-03 VITALS — BP 107/59 | HR 97 | Resp 12

## 2023-05-03 DIAGNOSIS — Z170 Estrogen receptor positive status [ER+]: Secondary | ICD-10-CM

## 2023-05-03 DIAGNOSIS — Z5112 Encounter for antineoplastic immunotherapy: Secondary | ICD-10-CM | POA: Insufficient documentation

## 2023-05-03 DIAGNOSIS — C787 Secondary malignant neoplasm of liver and intrahepatic bile duct: Secondary | ICD-10-CM | POA: Insufficient documentation

## 2023-05-03 DIAGNOSIS — C7951 Secondary malignant neoplasm of bone: Secondary | ICD-10-CM

## 2023-05-03 DIAGNOSIS — Z79899 Other long term (current) drug therapy: Secondary | ICD-10-CM | POA: Insufficient documentation

## 2023-05-03 DIAGNOSIS — C50312 Malignant neoplasm of lower-inner quadrant of left female breast: Secondary | ICD-10-CM | POA: Insufficient documentation

## 2023-05-03 DIAGNOSIS — G893 Neoplasm related pain (acute) (chronic): Secondary | ICD-10-CM

## 2023-05-03 DIAGNOSIS — C7931 Secondary malignant neoplasm of brain: Secondary | ICD-10-CM | POA: Diagnosis not present

## 2023-05-03 LAB — CBC WITH DIFFERENTIAL (CANCER CENTER ONLY)
Abs Immature Granulocytes: 0.03 10*3/uL (ref 0.00–0.07)
Basophils Absolute: 0 10*3/uL (ref 0.0–0.1)
Basophils Relative: 1 %
Eosinophils Absolute: 0 10*3/uL (ref 0.0–0.5)
Eosinophils Relative: 1 %
HCT: 41.8 % (ref 36.0–46.0)
Hemoglobin: 14.1 g/dL (ref 12.0–15.0)
Immature Granulocytes: 1 %
Lymphocytes Relative: 12 %
Lymphs Abs: 0.7 10*3/uL (ref 0.7–4.0)
MCH: 31.8 pg (ref 26.0–34.0)
MCHC: 33.7 g/dL (ref 30.0–36.0)
MCV: 94.1 fL (ref 80.0–100.0)
Monocytes Absolute: 0.6 10*3/uL (ref 0.1–1.0)
Monocytes Relative: 9 %
Neutro Abs: 4.8 10*3/uL (ref 1.7–7.7)
Neutrophils Relative %: 76 %
Platelet Count: 254 10*3/uL (ref 150–400)
RBC: 4.44 MIL/uL (ref 3.87–5.11)
RDW: 15.4 % (ref 11.5–15.5)
WBC Count: 6.2 10*3/uL (ref 4.0–10.5)
nRBC: 0 % (ref 0.0–0.2)

## 2023-05-03 LAB — CMP (CANCER CENTER ONLY)
ALT: 13 U/L (ref 0–44)
AST: 16 U/L (ref 15–41)
Albumin: 4.1 g/dL (ref 3.5–5.0)
Alkaline Phosphatase: 105 U/L (ref 38–126)
Anion gap: 5 (ref 5–15)
BUN: 11 mg/dL (ref 8–23)
CO2: 29 mmol/L (ref 22–32)
Calcium: 9.3 mg/dL (ref 8.9–10.3)
Chloride: 106 mmol/L (ref 98–111)
Creatinine: 0.42 mg/dL — ABNORMAL LOW (ref 0.44–1.00)
GFR, Estimated: >60 mL/min (ref >60)
Glucose, Bld: 99 mg/dL (ref 70–99)
Potassium: 3.8 mmol/L (ref 3.5–5.1)
Sodium: 140 mmol/L (ref 135–145)
Total Bilirubin: 0.5 mg/dL (ref 0.3–1.2)
Total Protein: 6.3 g/dL — ABNORMAL LOW (ref 6.5–8.1)

## 2023-05-03 MED ORDER — DIPHENHYDRAMINE HCL 25 MG PO CAPS
50.0000 mg | ORAL_CAPSULE | Freq: Once | ORAL | Status: AC
  Administered 2023-05-03: 50 mg via ORAL
  Filled 2023-05-03: qty 2

## 2023-05-03 MED ORDER — TRASTUZUMAB-ANNS CHEMO 150 MG IV SOLR
4.0000 mg/kg | Freq: Once | INTRAVENOUS | Status: AC
  Administered 2023-05-03: 150 mg via INTRAVENOUS
  Filled 2023-05-03: qty 7.14

## 2023-05-03 MED ORDER — SODIUM CHLORIDE 0.9 % IV SOLN: Freq: Once | INTRAVENOUS | Status: AC

## 2023-05-03 MED ORDER — SODIUM CHLORIDE 0.9% FLUSH
10.0000 mL | INTRAVENOUS | Status: DC | PRN
  Administered 2023-05-03: 10 mL

## 2023-05-03 MED ORDER — ACETAMINOPHEN 325 MG PO TABS
650.0000 mg | ORAL_TABLET | Freq: Once | ORAL | Status: AC
  Administered 2023-05-03: 650 mg via ORAL
  Filled 2023-05-03: qty 2

## 2023-05-03 MED ORDER — HEPARIN SOD (PORK) LOCK FLUSH 100 UNIT/ML IV SOLN
500.0000 [IU] | Freq: Once | INTRAVENOUS | Status: AC | PRN
  Administered 2023-05-03: 500 [IU]

## 2023-05-03 NOTE — Patient Instructions (Signed)
[facility]  Discharge Instructions: Thank you for choosing [facility] to provide your oncology and hematology care.   If you have a lab appointment with the Cancer Center, please go directly to the Cancer Center and check in at the registration area.   Wear comfortable clothing and clothing appropriate for easy access to any Portacath or PICC line.   We strive to give you quality time with your provider. You may need to reschedule your appointment if you arrive late (15 or more minutes).  Arriving late affects you and other patients whose appointments are after yours.  Also, if you miss three or more appointments without notifying the office, you may be dismissed from the clinic at the provider's discretion.      For prescription refill requests, have your pharmacy contact our office and allow 72 hours for refills to be completed.    Today you received the following chemotherapy and/or immunotherapy agents: Kanjinti.      To help prevent nausea and vomiting after your treatment, we encourage you to take your nausea medication as directed.  BELOW ARE SYMPTOMS THAT SHOULD BE REPORTED IMMEDIATELY: *FEVER GREATER THAN 100.4 F (38 C) OR HIGHER *CHILLS OR SWEATING *NAUSEA AND VOMITING THAT IS NOT CONTROLLED WITH YOUR NAUSEA MEDICATION *UNUSUAL SHORTNESS OF BREATH *UNUSUAL BRUISING OR BLEEDING *URINARY PROBLEMS (pain or burning when urinating, or frequent urination) *BOWEL PROBLEMS (unusual diarrhea, constipation, pain near the anus) TENDERNESS IN MOUTH AND THROAT WITH OR WITHOUT PRESENCE OF ULCERS (sore throat, sores in mouth, or a toothache) UNUSUAL RASH, SWELLING OR PAIN  UNUSUAL VAGINAL DISCHARGE OR ITCHING   Items with * indicate a potential emergency and should be followed up as soon as possible or go to the Emergency Department if any problems should occur.  Please show the CHEMOTHERAPY ALERT CARD or IMMUNOTHERAPY ALERT CARD at  check-in to the Emergency Department and triage nurse.  Should you have questions after your visit or need to cancel or reschedule your appointment, please contact [facility]  Dept: [phone]  and follow the prompts.  Office hours are 8:00 a.m. to 4:30 p.m. Monday - Friday. Please note that voicemails left after 4:00 p.m. may not be returned until the following business day.  We are closed weekends and major holidays. You have access to a nurse at all times for urgent questions. Please call the main number to the clinic Dept: [phone] and follow the prompts.   For any non-urgent questions, you may also contact your provider using MyChart. We now offer e-Visits for anyone 18 and older to request care online for non-urgent symptoms. For details visit [email or web address].   Also download the MyChart app! Go to the app store, search "MyChart", open the app, select [place], and log in with your MyChart username and password.   

## 2023-05-03 NOTE — Progress Notes (Signed)
[facility]   Telephone:([phone]) [phone] Fax:(336) [phone]   Clinic Follow up Note   Patient Care Team: Ardith Dark, MD as PCP - General (Family Medicine) Almond Lint, MD as Consulting Physician (General Surgery) Malachy Mood, MD as Consulting Physician (Hematology) Lonie Peak, MD as Attending Physician (Radiation Oncology) Pollyann Samples, NP as Nurse Practitioner (Nurse Practitioner)  Date of Service:  05/03/2023  CHIEF COMPLAINT: f/u of metastatic breast cancer   CURRENT THERAPY:  -Zometa q3 months -Tucatinib rapid dose  and capecitabine 2 tablets twice daily trastuzumab every 2 weeks  ASSESSMENT:  Martha [name] is a 62 y.o. female with   Metastatic breast cancer to bone and brain -Diagnosed in 06/2022 --PET scan from 08/03/2022 showed diffuse bone mets  -she underwent cervical laminectomy and fusion by Dr. Maisie Fus on 08/17/2022, biopsy confirmed metastatic breast cancer, ER/PR negative, HER2 positive. -due to cervical cord compression with myelopathy, she underwent a second cervical spine surgery on September 06, 2022, and completed inpatient rehabitation. -She has completed brain, cervical and lumbar spine radiation in Feb 2024 -She was recently hospitalized for altered mental status and confusion, probably secondary to medication -She started first line systemic chemotherapy with weekly Taxol and trastuzumab/Perjeta on 10/25/22. She has tolerated well so far and her pain has improved overall -PET scan from Jan 01, 2023 showed complete metabolic response, no hypermetabolic uptake in diffuse bone lesions, no other new lesions.  She is clinically also doing much better.  Given her excellent response to chemo, we will change her Taxol to 2 weeks on and 1 week off -She Unfortunately developed more more brain metastasis on recent brain MRI. -I personally reviewed her restaging PET scan from April 13, 2023, which showed stable disease, no new lesions. -Due to  her new brain metastasis, I plan to change her email to Tucatinib, capecitabine and trastuzumab, for better brain penetration. She started in late August 2024, tolerating well so far.  Her case was reviewed in CNS tumor board yesterday, Dr. Basilio Cairo plan to hold on whole brain radiation since she is starting new Her2 antibody and monitor her brain mets closely.  -continue zometa every 3 months     Malignant neoplasm of lower-inner quadrant of left breast in female, estrogen receptor positive ([name]) She was diagnosed in 12/2017. She is s/p left breast lumpectomy and adjuvant radiation.  -She started anti-estrogen therapy with letrozole on 05/2018. Tolerating well with no issues -lost f/u after visit in 04/2020 until her recurrence in 07/2022     Cancer related pain She is on low-dose oxycodone, she has developed a significant pain in the right rib cage, probably related to her bone metastasis  -f/u with palliative care clinic NP Nikki         PLAN: -lab review -CMP- review -She is tolerating oral capecitabine and Tucatinib well overall, currently on ramping dose of Tucatinib 1 tablet twice daily, will increase to 3 tablets a day in the next week, and continue for 2 weeks, before increase to full dose (2 tabs bid) - continue Trastuzumab q2 wk - proceed with treatment today  -lab/flush and treatment 9/19 -next echo around 9/20, will schedule     SUMMARY OF ONCOLOGIC HISTORY: Oncology History Overview Note  Cancer Staging Malignant neoplasm of lower-inner quadrant of left breast in female, estrogen receptor positive ([name]) Staging form: Breast, AJCC 8th Edition - Clinical stage from 01/16/2018: Stage IA (cT1c, cN0, cM0, G2, ER+, PR+, HER2-) - Signed by Malachy Mood, MD on  01/23/2018 - Pathologic: Stage IA (pT1c, pN1, cM0, G1, ER+, PR+, HER2-) - Signed by Lonie Peak, MD on 04/09/2018     Malignant neoplasm of lower-inner quadrant of left breast in female, estrogen receptor positive ([name])   01/15/2018 Mammogram   Diagnositc Mammogram 01/15/18  IMPRESSION: 1. Suspicious 1.2 x 1.4 x 1.3 cm mixed echogenicity mass left breast 7 o'clock position retroareolar location at the site of palpable concern.. 2. Indeterminate Within the left breast 7:30 o'clock retroareolar location, adjacent to the palpable mass, is a 0.5 x 0.4 x 0.5 cm oval circumscribed hypoechoic mass. 3. Indeterminate calcifications within the lateral left breast. Location of these calcifications is not definitely confirmed on the true lateral view.    01/16/2018 Initial Biopsy   Diagnosis 01/16/18 1. Breast, left, needle core biopsy, 7:30 o'clock (ribbon clip) - FIBROCYSTIC CHANGES WITH SCLEROSING ADENOSIS AND CALCIFICATIONS. - FIBROADENOMATOID CHANGE. - NO MALIGNANCY IDENTIFIED. 2. Breast, left, needle core biopsy, 7 o'clock position (coil clip) - INVASIVE MAMMARY CARCINOMA, MSBR GRADE I/II. - SEE MICROSCOPIC DESCRIPTION Microscopic Comment  ADDENDUM: Immunohistochemistry for E-Cadherin is strongly positive in the tumor consistent with ductal carcinoma. (JDP:ah 01/17/18)   01/16/2018 Receptors her2   Estrogen Receptor: 100%, POSITIVE, STRONG STAINING INTENSITY Progesterone Receptor: 50%, POSITIVE, STRONG STAINING INTENSITY Proliferation Marker Ki67: 20% HER2 Negative   01/16/2018 Cancer Staging   Staging form: Breast, AJCC 8th Edition - Clinical stage from 01/16/2018: Stage IA (cT1c, cN0, cM0, G2, ER+, PR+, HER2-) - Signed by Malachy Mood, MD on 01/23/2018   01/22/2018 Initial Diagnosis   Malignant neoplasm of lower-inner quadrant of left breast in female, estrogen receptor positive ([name])   02/21/2018 Surgery    LEFT BREAST LUMPECTOMY WITH AXILLARY LYMPH NODE BIOPSY by Dr. Donell Beers  02/21/18   02/21/2018 Pathology Results   Diagnosis 02/21/18 1. Breast, lumpectomy, Left - INVASIVE DUCTAL CARCINOMA, GRADE I, 1.6 CM. - DUCTAL CARCINOMA IN SITU, INTERMEDIATE NUCLEAR GRADE. - ANTERIOR AND MEDIAL RESECTION  MARGINS ARE POSITIVE FOR CARCINOMA. - NEGATIVE FOR LYMPHOVASCULAR OR PERINEURAL INVASION. - BACKGROUND BREAST TISSUE WITH FIBROCYSTIC CHANGE, INCLUDING SCLEROSING ADENOSIS. - BIOPSY SITE CHANGES. - SEE ONCOLOGY TABLE. 2. Lymph node, sentinel, biopsy, Left Axillary #1 - METASTATIC BREAST CARCINOMA TO A LYMPH NODE, 1.0 CM IN GREATEST DIMENSION, WITH EXTRANODAL EXTENSION (1/1). 3. Lymph node, sentinel, biopsy, Left Axillary #2 - LYMPH NODE, NEGATIVE FOR CARCINOMA (0/1).    02/21/2018 Miscellaneous   Mammaprint 02/21/18 Low Risk with 10-year risk of recurrnce at 10% -No potential signifcant chemotherapy benefit   03/20/2018 Pathology Results   RE-EXCISION OF BREAST LUMPECTOMY by Dr. Donell Beers  Diagnosis 03/20/18 1. Breast, excision, Left new anterior margin - FIBROCYSTIC CHANGES WITH ADENOSIS AND CALCIFICATIONS. - HEALING BIOPSY SITE. - THERE IS NO EVIDENCE OF MALIGNANCY. 2. Breast, excision, Left new medial margin - FIBROCYSTIC CHANGES WITH ADENOSIS AND CALCIFICATIONS. - HEALING BIOPSY SITE. - THERE IS NO EVIDENCE OF MALIGNANCY. Microscopic Comment 1. -2. The surgical resection margin(s) of the specimen were inked and microscopically evaluated. (JBK:kh 03-22-18)   04/09/2018 Cancer Staging   Staging form: Breast, AJCC 8th Edition - Pathologic: Stage IA (pT1c, pN1, cM0, G1, ER+, PR+, HER2-) - Signed by Lonie Peak, MD on 04/09/2018   04/22/2018 - 06/03/2018 Radiation Therapy   Radaiton with Dr. Basilio Cairo 04/22/18-06/03/18   05/2018 -  Anti-estrogen oral therapy   Letrozole 2.5mg  started 05/2018    Survivorship   Per Santiago Glad, NP    05/04/2022 Imaging    IMPRESSION: Cervical spondylosis, as described.   Nonspecific straightening  of the expected cervical lordosis.   07/24/2022 Imaging    IMPRESSION: 1. Extensive osseous metastatic disease with pathologic fracture at the base of dens and C2 right lateral mass. Extraosseous tumor at C1 and C2 likely impinging on the right C2 and C3  nerve roots. 2. Degenerative cord impingement at C4-5 to C6-7. Biforaminal impingement at C5-6 and C6-7.   08/03/2022 PET scan    IMPRESSION: 1. Large volume osseous metastasis. 2. Low right cervical and probable right axillary nodal metastasis. 3. Subtle heterogeneous activity throughout the liver with suggestion of small liver lesions (likely new compared to chest CT of 07/16/2020). Findings are overall moderately suspicious for hepatic metastasis. Pre and post contrast abdominal MRI (preferred) or CT could confirm. 4. Right-sided pleural thickening and trace pleural fluid. Right base airspace disease is favored to represent chronic atelectasis. 5. Aortic atherosclerosis (ICD10-I70.0) and emphysema (ICD10-J43.9).     08/11/2022 Genetic Testing   Negative genetic testing on the CancerNext-Expanded+RNAinsight panel.  The report date is August 11, 2022.  The CancerNext-Expanded gene panel offered by [facility] and includes sequencing and rearrangement analysis for the following 77 genes: AIP, ALK, APC*, ATM*, AXIN2, BAP1, BARD1, BLM, BMPR1A, BRCA1*, BRCA2*, BRIP1*, CDC73, CDH1*, CDK4, CDKN1B, CDKN2A, CHEK2*, CTNNA1, DICER1, FANCC, FH, FLCN, GALNT12, KIF1B, LZTR1, MAX, MEN1, MET, MLH1*, MSH2*, MSH3, MSH6*, MUTYH*, NBN, NF1*, NF2, NTHL1, PALB2*, PHOX2B, PMS2*, POT1, PRKAR1A, PTCH1, PTEN*, RAD51C*, RAD51D*, RB1, RECQL, RET, SDHA, SDHAF2, SDHB, SDHC, SDHD, SMAD4, SMARCA4, SMARCB1, SMARCE1, STK11, SUFU, TMEM127, TP53*, TSC1, TSC2, VHL and XRCC2 (sequencing and deletion/duplication); EGFR, EGLN1, HOXB13, KIT, MITF, PDGFRA, POLD1, and POLE (sequencing only); EPCAM and GREM1 (deletion/duplication only). DNA and RNA analyses performed for * genes.    Lobular carcinoma in situ (LCIS) of right breast  01/20/2020 Mammogram   Diagnostic Mammogram 01/20/20 IMPRESSION: 1.  Stable post lumpectomy changes of the left breast.   2. Suspicious microcalcifications over the right upper outer quadrant  spanning 3.6 cm.   02/02/2020 Initial Biopsy   Diagnosis 02/02/20 Breast, right, needle core biopsy, upper outer quadrant, x clip - LOBULAR CARCINOMA IN SITU WITH PLEOMORPHIC FEATURES AND CALCIFICATIONS, INVOLVING ADENOSIS. SEE NOTE Diagnosis Note Immunohistochemical stain for E-cadherin is negative in the lesional cells, consistent with a lobular phenotype. Immunostains for p63, SMM 1 and calponin do not show evidence of invasive carcinoma.    02/04/2020 Initial Diagnosis   Lobular carcinoma in situ (LCIS) of right breast   03/18/2020 Surgery   RIGHT BREAST LUMPECTOMY WITH RADIOACTIVE SEED LOCALIZATION by Dr Alvira Monday    03/18/2020 Pathology Results   FINAL MICROSCOPIC DIAGNOSIS:   A. BREAST, RIGHT, LUMPECTOMY:  - Pleomorphic lobular carcinoma in situ with calcifications and  underlying complex sclerosing lesion, adenosis and fibroadenomatoid  change.  - Margins of resection are not involved (Closest margins: < 1 mm,  anterior, posterior, inferior and medial).  - Biopsy site.    COMMENT:   P63, Calponin and SMM-1 demonstrate the presence of myoepithelium in the  select focus.    Metastatic breast cancer to bone and brain  05/04/2022 Imaging    IMPRESSION: Cervical spondylosis, as described.   Nonspecific straightening of the expected cervical lordosis.   07/24/2022 Imaging    IMPRESSION: 1. Extensive osseous metastatic disease with pathologic fracture at the base of dens and C2 right lateral mass. Extraosseous tumor at C1 and C2 likely impinging on the right C2 and C3 nerve roots. 2. Degenerative cord impingement at C4-5 to C6-7. Biforaminal impingement at C5-6 and C6-7.   08/02/2022  Initial Diagnosis   Cancer, metastatic to bone ([name])   08/03/2022 PET scan    IMPRESSION: 1. Large volume osseous metastasis. 2. Low right cervical and probable right axillary nodal metastasis. 3. Subtle heterogeneous activity throughout the liver with suggestion of small liver lesions  (likely new compared to chest CT of 07/16/2020). Findings are overall moderately suspicious for hepatic metastasis. Pre and post contrast abdominal MRI (preferred) or CT could confirm. 4. Right-sided pleural thickening and trace pleural fluid. Right base airspace disease is favored to represent chronic atelectasis. 5. Aortic atherosclerosis (ICD10-I70.0) and emphysema (ICD10-J43.9).     10/26/2022 - 04/12/2023 Chemotherapy   Patient is on Treatment Plan : BREAST Paclitaxel D1,8,15 + Trastuzumab D1 + Pertuzumab D1 q21d x 8 cycles / Trastuzumab D1 + Pertuzumab D1 q21d x 4 cycles     05/03/2023 -  Chemotherapy   Patient is on Treatment Plan : BREAST MAINTENANCE Trastuzumab IV (6) or SQ (600) D1 q21d X 11 Cycles        INTERVAL HISTORY:  Martha [name] is here for a follow up of metastatic breast cancer. She was last seen by me on 04/17/2023. She presents to the clinic accompanied by husband. Pt state that she is on her off week with the Xeloda. Her first week she di have some diarrhea and some fatigue, overall tolerating treatment well.   All other systems were reviewed with the patient and are negative.  MEDICAL HISTORY:  Past Medical History:  Diagnosis Date   Anxiety    Cancer ([name]) 2022   right breast LCIS   Cancer ([name]) 2019   left breast   Depression    Dysrhythmia    SVT, s/p ablation ~ 2012 in at [facility]   Ectopic pregnancy    Family history of breast cancer    Family history of melanoma    History of radiation therapy 04/22/18-06/03/18   Left Breast, left SCV, axilla 50 Gy in 25 fractions, Left breast boost 10 Gy in 5 fractions.    Hypothyroidism    Personal history of radiation therapy    PONV (postoperative nausea and vomiting)    Thyroid disease     SURGICAL HISTORY: Past Surgical History:  Procedure Laterality Date   APPENDECTOMY     BILATERAL SALPINGECTOMY     BREAST BIOPSY Right 2014   fibroadenoma   BREAST BIOPSY Left 01/16/2018    BREAST BIOPSY Right 02/02/2020   LCIS   BREAST BIOPSY Right 08/04/2020   x2 LCIS   BREAST BIOPSY Right 08/13/2020   x2   BREAST EXCISIONAL BIOPSY Left 1990   benign   BREAST EXCISIONAL BIOPSY Right 02/02/2020   LCIS   BREAST LUMPECTOMY Left 01/22/2018   BREAST LUMPECTOMY WITH AXILLARY LYMPH NODE BIOPSY Left 02/21/2018   Procedure: LEFT BREAST LUMPECTOMY WITH AXILLARY LYMPH NODE BIOPSY;  Surgeon: Almond Lint, MD;  Location: [facility] OR;  Service: General;  Laterality: Left;   BREAST LUMPECTOMY WITH RADIOACTIVE SEED LOCALIZATION Right 03/18/2020   Procedure: RIGHT BREAST LUMPECTOMY WITH RADIOACTIVE SEED LOCALIZATION;  Surgeon: Almond Lint, MD;  Location: [facility] OR;  Service: General;  Laterality: Right;   BREAST SURGERY Left 1993   cyst removed   ENDOMETRIAL ABLATION     EYE SURGERY     GLAUCOMA SURGERY Bilateral    IR IMAGING GUIDED PORT INSERTION  10/16/2022   POSTERIOR CERVICAL FUSION/FORAMINOTOMY N/A 08/17/2022   Procedure: Cervical One-Cervical Four POSTERIOR CERVICAL FUSION,  Cervical One LAMINECTOMY REDUCTION OF Cervical Two Fracture, Biopsy  of Right Cerical Two Pars  Lesion;  Surgeon: Bedelia Person, MD;  Location: [facility] OR;  Service: Neurosurgery;  Laterality: N/A;   POSTERIOR CERVICAL FUSION/FORAMINOTOMY N/A 09/06/2022   Procedure: Posterior Cervical Fusion, Foraminotomy , Cervical Five-Six, Cervical Six-Seven; Extension of  fusion Cervical Four- Thoracic One;  Surgeon: Bedelia Person, MD;  Location: [facility] OR;  Service: Neurosurgery;  Laterality: N/A;   RADIOACTIVE SEED GUIDED EXCISIONAL BREAST BIOPSY Right 04/28/2021   Procedure: RADIOACTIVE SEED GUIDED EXCISIONAL RIGHT BREAST BIOPSY X2;  Surgeon: Almond Lint, MD;  Location: [facility];  Service: General;  Laterality: Right;   RE-EXCISION OF BREAST LUMPECTOMY Left 03/20/2018   Procedure: RE-EXCISION OF BREAST LUMPECTOMY;  Surgeon: Almond Lint, MD;  Location: [facility];  Service: General;  Laterality:  Left;    I have reviewed the social history and family history with the patient and they are unchanged from previous note.  ALLERGIES:  is allergic to morphine and codeine.  MEDICATIONS:  Current Outpatient Medications  Medication Sig Dispense Refill   acetaminophen (TYLENOL) 500 MG tablet Take 2 tablets (1,000 mg total) by mouth every 8 (eight) hours. 30 tablet 0   capecitabine (XELODA) 500 MG tablet TAKE 2 TABLETS (1,000 MG) TWICE DAILY AFTER A MEAL FOR 7 DAYS ON, THEN 7 DAYS OFF. REPEAT EVERY 14 DAYS 56 tablet 0   citalopram (CELEXA) 40 MG tablet Take 1 tablet (40 mg total) by mouth daily. 30 tablet 3   diphenoxylate-atropine (LOMOTIL) 2.5-0.025 MG tablet TAKE 1 TABLET BY MOUTH 4 (FOUR) TIMES DAILY AS NEEDED FOR DIARRHEA OR LOOSE STOOLS. 60 tablet 0   levothyroxine (SYNTHROID) 75 MCG tablet Take 1 tablet (75 mcg total) by mouth daily before breakfast. 30 tablet 0   lidocaine (XYLOCAINE) 2 % solution Patient: Mix 1part 2% viscous lidocaine, 1part H20. Swallow 10mL of diluted mixture, before meals and at bedtime, up to QID 100 mL 3   Loperamide HCl (IMODIUM PO) Take by mouth as needed.     LORazepam (ATIVAN) 0.5 MG tablet Take 1 tablet (0.5 mg total) by mouth every 6 (six) hours as needed for anxiety (agitation). 30 tablet 0   Mouthwashes (MOUTH RINSE) LIQD solution 15 mLs by Mouth Rinse route as needed (for oral care).  0   nicotine (NICODERM CQ - DOSED IN MG/24 HR) 7 mg/24hr patch Place 1 patch (7 mg total) onto the skin daily. 28 patch 0   ondansetron (ZOFRAN) 8 MG tablet Take 1 tablet (8 mg total) by mouth every 8 (eight) hours as needed for nausea or vomiting. 20 tablet 1   OVER THE COUNTER MEDICATION Take 1 tablet by mouth daily. Protandim Supplement     oxyCODONE (OXY IR/ROXICODONE) 5 MG immediate release tablet Take 0.5-1 tablets (2.5-5 mg total) by mouth every 4 (four) hours as needed for moderate pain or severe pain (dyspnea). 30 tablet 0   pregabalin (LYRICA) 25 MG capsule  Take 1 capsule (25 mg total) by mouth 2 (two) times daily. 60 capsule 2   senna-docusate (SENOKOT-S) 8.6-50 MG tablet Take 1 tablet by mouth 2 (two) times daily. 60 tablet 0   tucatinib (TUKYSA) 150 MG tablet Take 2 tablets (300 mg total) by mouth 2 (two) times daily. Take as instructed per MD. 120 tablet 0   No current facility-administered medications for this visit.   Facility-Administered Medications Ordered in Other Visits  Medication Dose Route Frequency Provider Last Rate Last Admin   sodium chloride flush (NS) 0.9 % injection  10 mL  10 mL Intracatheter PRN Malachy Mood, MD   10 mL at 05/03/23 1046    PHYSICAL EXAMINATION: ECOG PERFORMANCE STATUS: 3 - Symptomatic, >50% confined to bed  Vitals:   05/03/23 0901  BP: 120/73  Pulse: 86  Resp: 16  Temp: 97.6 F (36.4 C)  SpO2: 100%   Wt Readings from Last 3 Encounters:  05/03/23 78 lb 11.2 oz (35.7 kg)  04/12/23 78 lb (35.4 kg)  04/10/23 79 lb 9.6 oz (36.1 kg)     GENERAL:alert, no distress and comfortable SKIN: skin color normal, no rashes or significant lesions EYES: normal, Conjunctiva are pink and non-injected, sclera clear  NEURO: alert & oriented x 3 with fluent speech LABORATORY DATA:  I have reviewed the data as listed    Latest Ref Rng & Units 05/03/2023    8:40 AM 04/12/2023    8:00 AM 03/29/2023    7:53 AM  CBC  WBC 4.0 - 10.5 K/uL 6.2  4.1  4.3   Hemoglobin 12.0 - 15.0 g/dL 16.1  09.6  04.5   Hematocrit 36.0 - 46.0 % 41.8  39.7  38.1   Platelets 150 - 400 K/uL 254  239  234         Latest Ref Rng & Units 05/03/2023    8:40 AM 04/12/2023    8:00 AM 03/29/2023    7:53 AM  CMP  Glucose 70 - 99 mg/dL 99  409  811   BUN 8 - 23 mg/dL 11  11  9    Creatinine 0.44 - 1.00 mg/dL 9.14  <7.82  9.56   Sodium 135 - 145 mmol/L 140  140  140   Potassium 3.5 - 5.1 mmol/L 3.8  3.7  3.7   Chloride 98 - 111 mmol/L 106  106  106   CO2 22 - 32 mmol/L 29  27  29    Calcium 8.9 - 10.3 mg/dL 9.3  8.6  9.0   Total Protein 6.5 - 8.1  g/dL 6.3  6.2  6.0   Total Bilirubin 0.3 - 1.2 mg/dL 0.5  0.4  0.5   Alkaline Phos 38 - 126 U/L 105  106  92   AST 15 - 41 U/L 16  27  17    ALT 0 - 44 U/L 13  21  13        RADIOGRAPHIC STUDIES: I have personally reviewed the radiological images as listed and agreed with the findings in the report. No results found.    Orders Placed This Encounter  Procedures   ECHOCARDIOGRAM COMPLETE    Standing Status:   Future    Standing Expiration Date:   05/02/2024    Order Specific Question:   Where should this test be performed    Answer:   [facility]    Order Specific Question:   Perflutren DEFINITY (image enhancing agent) should be administered unless hypersensitivity or allergy exist    Answer:   Administer Perflutren    Order Specific Question:   Is a special reader required? (athlete or structural heart)    Answer:   No    Order Specific Question:   Does this study need to be read by the Structural team/Level 3 readers?    Answer:   No    Order Specific Question:   Reason for exam-Echo    Answer:   Chemo  Z09   All questions were answered. The patient knows to call the clinic with any problems, questions or concerns.  No barriers to learning was detected. The total time spent in the appointment was 30 minutes.     Malachy Mood, MD 05/03/2023   Carolin Coy, CMA, am acting as scribe for Malachy Mood, MD.   I have reviewed the above documentation for accuracy and completeness, and I agree with the above.

## 2023-05-03 NOTE — Assessment & Plan Note (Addendum)
-  Diagnosed in 06/2022 --PET scan from 08/03/2022 showed diffuse bone mets  -she underwent cervical laminectomy and fusion by Dr. Maisie Fus on 08/17/2022, biopsy confirmed metastatic breast cancer, ER/PR negative, HER2 positive. -due to cervical cord compression with myelopathy, she underwent a second cervical spine surgery on September 06, 2022, and completed inpatient rehabitation. -She has completed brain, cervical and lumbar spine radiation in Feb 2024 -She was recently hospitalized for altered mental status and confusion, probably secondary to medication -She started first line systemic chemotherapy with weekly Taxol and trastuzumab/Perjeta on 10/25/22. She has tolerated well so far and her pain has improved overall -PET scan from Jan 01, 2023 showed complete metabolic response, no hypermetabolic uptake in diffuse bone lesions, no other new lesions.  She is clinically also doing much better.  Given her excellent response to chemo, we will change her Taxol to 2 weeks on and 1 week off -She Unfortunately developed more more brain metastasis on recent brain MRI. -I personally reviewed her restaging PET scan from April 13, 2023, which showed stable disease, no new lesions. -Due to her new brain metastasis, I plan to change her email to Tucatinib, capecitabine and trastuzumab, for better brain penetration. She started in late August 2024, tolerating well so far.  Her case was reviewed in CNS tumor board yesterday, Dr. Basilio Cairo plan to hold on whole brain radiation since she is starting new Her2 antibody and monitor her brain mets closely.  -continue zometa every 3 months

## 2023-05-03 NOTE — Assessment & Plan Note (Signed)
--  She was diagnosed in 12/2017. She is s/p left breast lumpectomy and adjuvant radiation.  -She started anti-estrogen therapy with letrozole on 05/2018. Tolerating well with no issues -lost f/u after visit in 04/2020 until her recurrence in 07/2022

## 2023-05-03 NOTE — Assessment & Plan Note (Signed)
-  She is on low-dose oxycodone, she has developed a significant pain in the right rib cage, probably related to her bone metastasis  -f/u with palliative care clinic NP Nikki  

## 2023-05-08 ENCOUNTER — Other Ambulatory Visit: Payer: Self-pay

## 2023-05-08 DIAGNOSIS — C7951 Secondary malignant neoplasm of bone: Secondary | ICD-10-CM

## 2023-05-08 MED ORDER — TUCATINIB 150 MG PO TABS
300.0000 mg | ORAL_TABLET | Freq: Two times a day (BID) | ORAL | 0 refills | Status: DC
Start: 2023-05-08 — End: 2023-05-17

## 2023-05-14 ENCOUNTER — Other Ambulatory Visit: Payer: Self-pay | Admitting: Nurse Practitioner

## 2023-05-14 ENCOUNTER — Ambulatory Visit (HOSPITAL_COMMUNITY)
Admission: RE | Admit: 2023-05-14 | Discharge: 2023-05-14 | Disposition: A | Discharge status: Home / Self Care | Payer: BC Managed Care – PPO | Source: Ambulatory Visit | Attending: Hematology | Admitting: Hematology

## 2023-05-14 DIAGNOSIS — C7931 Secondary malignant neoplasm of brain: Secondary | ICD-10-CM | POA: Insufficient documentation

## 2023-05-14 DIAGNOSIS — F17200 Nicotine dependence, unspecified, uncomplicated: Secondary | ICD-10-CM | POA: Diagnosis not present

## 2023-05-14 DIAGNOSIS — I700 Atherosclerosis of aorta: Secondary | ICD-10-CM | POA: Diagnosis not present

## 2023-05-14 DIAGNOSIS — C7951 Secondary malignant neoplasm of bone: Secondary | ICD-10-CM | POA: Diagnosis not present

## 2023-05-14 DIAGNOSIS — C50919 Malignant neoplasm of unspecified site of unspecified female breast: Secondary | ICD-10-CM | POA: Insufficient documentation

## 2023-05-14 DIAGNOSIS — Z0189 Encounter for other specified special examinations: Secondary | ICD-10-CM

## 2023-05-14 DIAGNOSIS — R197 Diarrhea, unspecified: Secondary | ICD-10-CM

## 2023-05-14 DIAGNOSIS — R4182 Altered mental status, unspecified: Secondary | ICD-10-CM | POA: Diagnosis not present

## 2023-05-14 DIAGNOSIS — Z515 Encounter for palliative care: Secondary | ICD-10-CM

## 2023-05-14 LAB — ECHOCARDIOGRAM COMPLETE
Area-P 1/2: 3.99 cm2
Calc EF: 64.8 %
S' Lateral: 2 cm
Single Plane A2C EF: 67.8 %
Single Plane A4C EF: 61.8 %

## 2023-05-14 MED ORDER — DIPHENOXYLATE-ATROPINE 2.5-0.025 MG PO TABS: 1.0000 | ORAL_TABLET | Freq: Four times a day (QID) | ORAL | 0 refills | Status: DC | PRN

## 2023-05-14 NOTE — Progress Notes (Signed)
  Echocardiogram 2D Echocardiogram has been performed.  Martha [name] 05/14/2023, 12:13 PM

## 2023-05-15 NOTE — Progress Notes (Signed)
Palliative Medicine [facility]  Telephone:([phone] Fax:([phone]) [phone]   Name: Brenn Evangelista Date: 05/15/2023 MRN: [id number]  DOB: 07-19-1961  Patient Care Team: Ardith Dark, MD as PCP - General (Family Medicine) Almond Lint, MD as Consulting Physician (General Surgery) Malachy Mood, MD as Consulting Physician (Hematology) Lonie Peak, MD as Attending Physician (Radiation Oncology) Pollyann Samples, NP as Nurse Practitioner (Nurse Practitioner)    INTERVAL HISTORY: Edmund Kien is a 62 y.o. female with oncologic medical history including ER+ breast cancer (01/2018), right breast cancer (04/2021), now with metastatic disease progression involving brain and liver, pathological fractures with osseous mets s/p brain radiation. .  Palliative ask to see for symptom management and goals of care.   SOCIAL HISTORY:    Mrs. Junes reports that she has been smoking cigarettes. She has a 20 pack-year smoking history. She has never used smokeless tobacco. She reports that she does not currently use alcohol after a past usage of about 28.0 standard drinks of alcohol per week. She reports that she does not use drugs.  ADVANCE DIRECTIVES:  Has documents on file  CODE STATUS: DNR  PAST MEDICAL HISTORY: Past Medical History:  Diagnosis Date   Anxiety    Cancer ([name]) 2022   right breast LCIS   Cancer ([name]) 2019   left breast   Depression    Dysrhythmia    SVT, s/p ablation ~ 2012 in [facility]   Ectopic pregnancy    Family history of breast cancer    Family history of melanoma    History of radiation therapy 04/22/18-06/03/18   Left Breast, left SCV, axilla 50 Gy in 25 fractions, Left breast boost 10 Gy in 5 fractions.    Hypothyroidism    Personal history of radiation therapy    PONV (postoperative nausea and vomiting)    Thyroid disease     ALLERGIES:  is allergic to morphine and codeine.  MEDICATIONS:  Current Outpatient  Medications  Medication Sig Dispense Refill   acetaminophen (TYLENOL) 500 MG tablet Take 2 tablets (1,000 mg total) by mouth every 8 (eight) hours. 30 tablet 0   capecitabine (XELODA) 500 MG tablet TAKE 2 TABLETS (1,000 MG) TWICE DAILY AFTER A MEAL FOR 7 DAYS ON, THEN 7 DAYS OFF. REPEAT EVERY 14 DAYS 56 tablet 0   citalopram (CELEXA) 40 MG tablet Take 1 tablet (40 mg total) by mouth daily. 30 tablet 3   diphenoxylate-atropine (LOMOTIL) 2.5-0.025 MG tablet Take 1 tablet by mouth 4 (four) times daily as needed for diarrhea or loose stools. 90 tablet 0   levothyroxine (SYNTHROID) 75 MCG tablet Take 1 tablet (75 mcg total) by mouth daily before breakfast. 30 tablet 0   lidocaine (XYLOCAINE) 2 % solution Patient: Mix 1part 2% viscous lidocaine, 1part H20. Swallow 10mL of diluted mixture, before meals and at bedtime, up to QID 100 mL 3   Loperamide HCl (IMODIUM PO) Take by mouth as needed.     LORazepam (ATIVAN) 0.5 MG tablet Take 1 tablet (0.5 mg total) by mouth every 6 (six) hours as needed for anxiety (agitation). 30 tablet 0   Mouthwashes (MOUTH RINSE) LIQD solution 15 mLs by Mouth Rinse route as needed (for oral care).  0   nicotine (NICODERM CQ - DOSED IN MG/24 HR) 7 mg/24hr patch Place 1 patch (7 mg total) onto the skin daily. 28 patch 0   ondansetron (ZOFRAN) 8 MG tablet Take 1 tablet (8 mg total)  by mouth every 8 (eight) hours as needed for nausea or vomiting. 20 tablet 1   OVER THE COUNTER MEDICATION Take 1 tablet by mouth daily. Protandim Supplement     oxyCODONE (OXY IR/ROXICODONE) 5 MG immediate release tablet Take 0.5-1 tablets (2.5-5 mg total) by mouth every 4 (four) hours as needed for moderate pain or severe pain (dyspnea). 30 tablet 0   pregabalin (LYRICA) 25 MG capsule Take 1 capsule (25 mg total) by mouth 2 (two) times daily. 60 capsule 2   senna-docusate (SENOKOT-S) 8.6-50 MG tablet Take 1 tablet by mouth 2 (two) times daily. 60 tablet 0   tucatinib (TUKYSA) 150 MG tablet Take 2  tablets (300 mg total) by mouth 2 (two) times daily. Take as instructed per MD. 120 tablet 0   No current facility-administered medications for this visit.    VITAL SIGNS: There were no vitals taken for this visit. There were no vitals filed for this visit.  Estimated body mass index is 14.39 kg/m as calculated from the following:   Height as of 05/03/23: 5\' 2"  (1.575 m).   Weight as of 05/03/23: 78 lb 11.2 oz (35.7 kg).   PERFORMANCE STATUS (ECOG) : 3 - Symptomatic, >50% confined to bed   Physical Exam General: NAD, thin   Cardiovascular: regular rate and rhythm Pulmonary: normal breathing pattern Abdomen: soft, nontender, + bowel sounds Extremities: no edema, no joint deformities Skin: no rashes, muscle wasting  Neurological: alert, oriented x 4  IMPRESSION: I saw Mrs. Flockhart during her infusion. No acute distress. Her husband is present. Denies nausea, vomiting. Is taking things one day at a time. Occasional diarrhea which is controlled with lomotil. Reports recent episode of constipation. This has since resolved.   Decreased appetite/weight loss Appetite is stable. Current weight stable at 74lbs. She is enjoying eating foods that she enjoys. Drinking daily protein shakes.    Pain Mrs. Buczek shares pain is minimal. Occasional aches and pains. Lyrica as prescribed. Tylenol as needed. Describes pain in sacral and back area. Some stiffness and tenderness to neck area.   No symptom management needs at this time. Will continue to support as needed.   PLAN: Continue intake of Ensure and soft, high-calorie foods. Continue Lyrica 25 mg twice daily. Tylenol as needed for mild aches and pain.  Protective skin barrier to sacral area. Ongoing goals of care discussions and symptom management Palliative will plan to see patient back in 2-4 weeks in collaboration to other oncology appointments.  Patient and husband knows to contact office sooner if needed.   Patient expressed  understanding and was in agreement with this plan. She also understands that She can call the clinic at any time with any questions, concerns, or complaints.      Any controlled substances utilized were prescribed in the context of palliative care. PDMP has been reviewed.   Visit consisted of counseling and education dealing with the complex and emotionally intense issues of symptom management and palliative care in the setting of serious and potentially life-threatening illness.Greater than 50%  of this time was spent counseling and coordinating care related to the above assessment and plan.  Willette Alma, AGPCNP-BC  Palliative Medicine Team/[facility]

## 2023-05-17 ENCOUNTER — Inpatient Hospital Stay (HOSPITAL_BASED_OUTPATIENT_CLINIC_OR_DEPARTMENT_OTHER): Payer: BC Managed Care – PPO

## 2023-05-17 ENCOUNTER — Inpatient Hospital Stay: Payer: BC Managed Care – PPO

## 2023-05-17 ENCOUNTER — Inpatient Hospital Stay (HOSPITAL_BASED_OUTPATIENT_CLINIC_OR_DEPARTMENT_OTHER): Payer: BC Managed Care – PPO | Admitting: Hematology

## 2023-05-17 ENCOUNTER — Inpatient Hospital Stay (HOSPITAL_BASED_OUTPATIENT_CLINIC_OR_DEPARTMENT_OTHER): Payer: BC Managed Care – PPO | Admitting: Nurse Practitioner

## 2023-05-17 ENCOUNTER — Encounter: Payer: Self-pay | Admitting: Hematology

## 2023-05-17 VITALS — BP 93/52 | HR 73 | Resp 17

## 2023-05-17 VITALS — BP 101/66 | HR 95 | Temp 97.9°F | Resp 17 | Ht 62.0 in | Wt 75.3 lb

## 2023-05-17 DIAGNOSIS — Z515 Encounter for palliative care: Secondary | ICD-10-CM

## 2023-05-17 DIAGNOSIS — C799 Secondary malignant neoplasm of unspecified site: Secondary | ICD-10-CM | POA: Diagnosis not present

## 2023-05-17 DIAGNOSIS — C787 Secondary malignant neoplasm of liver and intrahepatic bile duct: Secondary | ICD-10-CM | POA: Diagnosis not present

## 2023-05-17 DIAGNOSIS — C7951 Secondary malignant neoplasm of bone: Secondary | ICD-10-CM

## 2023-05-17 DIAGNOSIS — Z170 Estrogen receptor positive status [ER+]: Secondary | ICD-10-CM

## 2023-05-17 DIAGNOSIS — G893 Neoplasm related pain (acute) (chronic): Secondary | ICD-10-CM

## 2023-05-17 DIAGNOSIS — Z95828 Presence of other vascular implants and grafts: Secondary | ICD-10-CM

## 2023-05-17 DIAGNOSIS — C7931 Secondary malignant neoplasm of brain: Secondary | ICD-10-CM | POA: Diagnosis not present

## 2023-05-17 DIAGNOSIS — Z5112 Encounter for antineoplastic immunotherapy: Secondary | ICD-10-CM | POA: Diagnosis not present

## 2023-05-17 DIAGNOSIS — R530 Neoplastic (malignant) related fatigue: Secondary | ICD-10-CM

## 2023-05-17 DIAGNOSIS — Z79899 Other long term (current) drug therapy: Secondary | ICD-10-CM | POA: Diagnosis not present

## 2023-05-17 DIAGNOSIS — C50312 Malignant neoplasm of lower-inner quadrant of left female breast: Secondary | ICD-10-CM | POA: Diagnosis not present

## 2023-05-17 LAB — CMP (CANCER CENTER ONLY)
ALT: 13 U/L (ref 0–44)
AST: 18 U/L (ref 15–41)
Albumin: 4.2 g/dL (ref 3.5–5.0)
Alkaline Phosphatase: 94 U/L (ref 38–126)
Anion gap: 5 (ref 5–15)
BUN: 14 mg/dL (ref 8–23)
CO2: 29 mmol/L (ref 22–32)
Calcium: 9.2 mg/dL (ref 8.9–10.3)
Chloride: 104 mmol/L (ref 98–111)
Creatinine: 0.48 mg/dL (ref 0.44–1.00)
GFR, Estimated: >60 mL/min (ref >60)
Glucose, Bld: 134 mg/dL — ABNORMAL HIGH (ref 70–99)
Potassium: 3.8 mmol/L (ref 3.5–5.1)
Sodium: 138 mmol/L (ref 135–145)
Total Bilirubin: 0.7 mg/dL (ref 0.3–1.2)
Total Protein: 6.4 g/dL — ABNORMAL LOW (ref 6.5–8.1)

## 2023-05-17 LAB — CBC WITH DIFFERENTIAL (CANCER CENTER ONLY)
Abs Immature Granulocytes: 0.02 10*3/uL (ref 0.00–0.07)
Basophils Absolute: 0 10*3/uL (ref 0.0–0.1)
Basophils Relative: 0 %
Eosinophils Absolute: 0 10*3/uL (ref 0.0–0.5)
Eosinophils Relative: 1 %
HCT: 40.9 % (ref 36.0–46.0)
Hemoglobin: 13.8 g/dL (ref 12.0–15.0)
Immature Granulocytes: 0 %
Lymphocytes Relative: 9 %
Lymphs Abs: 0.6 10*3/uL — ABNORMAL LOW (ref 0.7–4.0)
MCH: 31.9 pg (ref 26.0–34.0)
MCHC: 33.7 g/dL (ref 30.0–36.0)
MCV: 94.5 fL (ref 80.0–100.0)
Monocytes Absolute: 0.4 10*3/uL (ref 0.1–1.0)
Monocytes Relative: 7 %
Neutro Abs: 5.2 10*3/uL (ref 1.7–7.7)
Neutrophils Relative %: 83 %
Platelet Count: 207 10*3/uL (ref 150–400)
RBC: 4.33 MIL/uL (ref 3.87–5.11)
RDW: 16.2 % — ABNORMAL HIGH (ref 11.5–15.5)
WBC Count: 6.2 10*3/uL (ref 4.0–10.5)
nRBC: 0 % (ref 0.0–0.2)

## 2023-05-17 MED ORDER — SODIUM CHLORIDE 0.9% FLUSH
10.0000 mL | INTRAVENOUS | Status: DC | PRN
  Administered 2023-05-17: 10 mL

## 2023-05-17 MED ORDER — SODIUM CHLORIDE 0.9 % IV SOLN: Freq: Once | INTRAVENOUS | Status: AC

## 2023-05-17 MED ORDER — HEPARIN SOD (PORK) LOCK FLUSH 100 UNIT/ML IV SOLN
500.0000 [IU] | Freq: Once | INTRAVENOUS | Status: AC | PRN
  Administered 2023-05-17: 500 [IU]

## 2023-05-17 MED ORDER — TRASTUZUMAB-ANNS CHEMO 150 MG IV SOLR
4.0000 mg/kg | Freq: Once | INTRAVENOUS | Status: AC
  Administered 2023-05-17: 150 mg via INTRAVENOUS
  Filled 2023-05-17: qty 7.14

## 2023-05-17 MED ORDER — DIPHENHYDRAMINE HCL 25 MG PO CAPS
50.0000 mg | ORAL_CAPSULE | Freq: Once | ORAL | Status: AC
  Administered 2023-05-17: 50 mg via ORAL
  Filled 2023-05-17: qty 2

## 2023-05-17 MED ORDER — ACETAMINOPHEN 325 MG PO TABS
650.0000 mg | ORAL_TABLET | Freq: Once | ORAL | Status: AC
  Administered 2023-05-17: 650 mg via ORAL
  Filled 2023-05-17: qty 2

## 2023-05-17 MED ORDER — SODIUM CHLORIDE 0.9% FLUSH
10.0000 mL | Freq: Once | INTRAVENOUS | Status: AC
  Administered 2023-05-17: 10 mL

## 2023-05-17 MED ORDER — CAPECITABINE 500 MG PO TABS
1000.0000 mg | ORAL_TABLET | Freq: Two times a day (BID) | ORAL | 0 refills | Status: DC
Start: 2023-05-17 — End: 2023-06-22

## 2023-05-17 MED ORDER — TUCATINIB 150 MG PO TABS: 300.0000 mg | ORAL_TABLET | Freq: Two times a day (BID) | ORAL | 2 refills | Status: DC

## 2023-05-17 NOTE — Progress Notes (Signed)
[facility]   [phone] [phone]   Clinic Follow up Note   Patient Care Team: Ardith Dark, MD as PCP - General (Family Medicine) Almond Lint, MD as Consulting Physician (General Surgery) Malachy Mood, MD as Consulting Physician (Hematology) Lonie Peak, MD as Attending Physician (Radiation Oncology) Pollyann Samples, NP as Nurse Practitioner (Nurse Practitioner)  Date of Service:  05/17/2023  CHIEF COMPLAINT: f/u of    ASSESSMENT:  Nisreen Houseknecht is a 62 y.o. female with   Metastatic breast cancer to bone and brain -Diagnosed in 06/2022 --PET scan from 08/03/2022 showed diffuse bone mets  -she underwent cervical laminectomy and fusion by Dr. Maisie Fus on 08/17/2022, biopsy confirmed metastatic breast cancer, ER/PR negative, HER2 positive. -due to cervical cord compression with myelopathy, she underwent a second cervical spine surgery on September 06, 2022, and completed inpatient rehabitation. -She has completed brain, cervical and lumbar spine radiation in Feb 2024 -She was recently hospitalized for altered mental status and confusion, probably secondary to medication -She started first line systemic chemotherapy with weekly Taxol and trastuzumab/Perjeta on 10/25/22. She has tolerated well so far and her pain has improved overall -PET scan from Jan 01, 2023 showed complete metabolic response, no hypermetabolic uptake in diffuse bone lesions, no other new lesions.  She is clinically also doing much better.  Given her excellent response to chemo, we will change her Taxol to 2 weeks on and 1 week off -She Unfortunately developed more more brain metastasis on recent brain MRI. -I personally reviewed her restaging PET scan from April 13, 2023, which showed stable disease, no new lesions. -Due to her new brain metastasis, I changeD her email to Tucatinib, capecitabine and trastuzumab, for better brain penetration. She started in late August 2024,  tolerating well so far.  Her case was reviewed in CNS tumor board yesterday, Dr. Basilio Cairo plan to hold on whole brain radiation since she is starting new Her2 antibody and monitor her brain mets closely.  -she is also on Zometa every 3 months      Metastatic Breast Cancer On Tucatinib and Capecitabine. No significant side effects reported. Tucatinib causing some fatigue. Diarrhea managed with prescription antidiarrheal. Brain metastasis being monitored closely. -Continue current regimen of Tucatinib and Capecitabine. -Brain MRI scheduled for 05/30/2023. -Whole body PET scan planned for November 2024.  Weight Loss Lost 3 pounds since last visit. Consuming protein drinks and kind bars for nutritional supplementation. -Consider adding Ensure Boost for additional calories. -Continue current nutritional supplementation efforts.  Pain Management Occasional pain in the neck from previous fusion and lower back due to bone protrusion. Managed with Tylenol and lidocaine as needed. -Continue current pain management regimen.  General Health Maintenance -Continue ZOMETA every three months. -Dental check-up with Dr. Naomie Dean. -Refill Capecitabine and Tucatinib prescriptions. -Next appointment with Lowella Bandy today for symptom management     PLAN -Patient is clinically stable, tolerating treatment well, will continue capecitabine and Tucatinib -Will proceed trastuzumab today, and changed to every 3 weeks after next dose -Follow-up in 2 weeks    SUMMARY OF ONCOLOGIC HISTORY: Oncology History Overview Note  Cancer Staging Malignant neoplasm of lower-inner quadrant of left breast in female, estrogen receptor positive ([name]) Staging form: Breast, AJCC 8th Edition - Clinical stage from 01/16/2018: Stage IA (cT1c, cN0, cM0, G2, ER+, PR+, HER2-) - Signed by Malachy Mood, MD on 01/23/2018 - Pathologic: Stage IA (pT1c, pN1, cM0, G1, ER+, PR+, HER2-) - Signed by Lonie Peak, MD on 04/09/2018  Malignant  neoplasm of lower-inner quadrant of left breast in female, estrogen receptor positive ([name])  01/15/2018 Mammogram   Diagnositc Mammogram 01/15/18  IMPRESSION: 1. Suspicious 1.2 x 1.4 x 1.3 cm mixed echogenicity mass left breast 7 o'clock position retroareolar location at the site of palpable concern.. 2. Indeterminate Within the left breast 7:30 o'clock retroareolar location, adjacent to the palpable mass, is a 0.5 x 0.4 x 0.5 cm oval circumscribed hypoechoic mass. 3. Indeterminate calcifications within the lateral left breast. Location of these calcifications is not definitely confirmed on the true lateral view.    01/16/2018 Initial Biopsy   Diagnosis 01/16/18 1. Breast, left, needle core biopsy, 7:30 o'clock (ribbon clip) - FIBROCYSTIC CHANGES WITH SCLEROSING ADENOSIS AND CALCIFICATIONS. - FIBROADENOMATOID CHANGE. - NO MALIGNANCY IDENTIFIED. 2. Breast, left, needle core biopsy, 7 o'clock position (coil clip) - INVASIVE MAMMARY CARCINOMA, MSBR GRADE I/II. - SEE MICROSCOPIC DESCRIPTION Microscopic Comment  ADDENDUM: Immunohistochemistry for E-Cadherin is strongly positive in the tumor consistent with ductal carcinoma. (JDP:ah 01/17/18)   01/16/2018 Receptors her2   Estrogen Receptor: 100%, POSITIVE, STRONG STAINING INTENSITY Progesterone Receptor: 50%, POSITIVE, STRONG STAINING INTENSITY Proliferation Marker Ki67: 20% HER2 Negative   01/16/2018 Cancer Staging   Staging form: Breast, AJCC 8th Edition - Clinical stage from 01/16/2018: Stage IA (cT1c, cN0, cM0, G2, ER+, PR+, HER2-) - Signed by Malachy Mood, MD on 01/23/2018   01/22/2018 Initial Diagnosis   Malignant neoplasm of lower-inner quadrant of left breast in female, estrogen receptor positive ([name])   02/21/2018 Surgery    LEFT BREAST LUMPECTOMY WITH AXILLARY LYMPH NODE BIOPSY by Dr. Donell Beers  02/21/18   02/21/2018 Pathology Results   Diagnosis 02/21/18 1. Breast, lumpectomy, Left - INVASIVE DUCTAL CARCINOMA, GRADE I, 1.6 CM. -  DUCTAL CARCINOMA IN SITU, INTERMEDIATE NUCLEAR GRADE. - ANTERIOR AND MEDIAL RESECTION MARGINS ARE POSITIVE FOR CARCINOMA. - NEGATIVE FOR LYMPHOVASCULAR OR PERINEURAL INVASION. - BACKGROUND BREAST TISSUE WITH FIBROCYSTIC CHANGE, INCLUDING SCLEROSING ADENOSIS. - BIOPSY SITE CHANGES. - SEE ONCOLOGY TABLE. 2. Lymph node, sentinel, biopsy, Left Axillary #1 - METASTATIC BREAST CARCINOMA TO A LYMPH NODE, 1.0 CM IN GREATEST DIMENSION, WITH EXTRANODAL EXTENSION (1/1). 3. Lymph node, sentinel, biopsy, Left Axillary #2 - LYMPH NODE, NEGATIVE FOR CARCINOMA (0/1).    02/21/2018 Miscellaneous   Mammaprint 02/21/18 Low Risk with 10-year risk of recurrnce at 10% -No potential signifcant chemotherapy benefit   03/20/2018 Pathology Results   RE-EXCISION OF BREAST LUMPECTOMY by Dr. Donell Beers  Diagnosis 03/20/18 1. Breast, excision, Left new anterior margin - FIBROCYSTIC CHANGES WITH ADENOSIS AND CALCIFICATIONS. - HEALING BIOPSY SITE. - THERE IS NO EVIDENCE OF MALIGNANCY. 2. Breast, excision, Left new medial margin - FIBROCYSTIC CHANGES WITH ADENOSIS AND CALCIFICATIONS. - HEALING BIOPSY SITE. - THERE IS NO EVIDENCE OF MALIGNANCY. Microscopic Comment 1. -2. The surgical resection margin(s) of the specimen were inked and microscopically evaluated. (JBK:kh 03-22-18)   04/09/2018 Cancer Staging   Staging form: Breast, AJCC 8th Edition - Pathologic: Stage IA (pT1c, pN1, cM0, G1, ER+, PR+, HER2-) - Signed by Lonie Peak, MD on 04/09/2018   04/22/2018 - 06/03/2018 Radiation Therapy   Radaiton with Dr. Basilio Cairo 04/22/18-06/03/18   05/2018 -  Anti-estrogen oral therapy   Letrozole 2.5mg  started 05/2018    Survivorship   Per Santiago Glad, NP    05/04/2022 Imaging    IMPRESSION: Cervical spondylosis, as described.   Nonspecific straightening of the expected cervical lordosis.   07/24/2022 Imaging    IMPRESSION: 1. Extensive osseous metastatic disease with pathologic fracture at the base  of dens and C2 right  lateral mass. Extraosseous tumor at C1 and C2 likely impinging on the right C2 and C3 nerve roots. 2. Degenerative cord impingement at C4-5 to C6-7. Biforaminal impingement at C5-6 and C6-7.   08/03/2022 PET scan    IMPRESSION: 1. Large volume osseous metastasis. 2. Low right cervical and probable right axillary nodal metastasis. 3. Subtle heterogeneous activity throughout the liver with suggestion of small liver lesions (likely new compared to chest CT of 07/16/2020). Findings are overall moderately suspicious for hepatic metastasis. Pre and post contrast abdominal MRI (preferred) or CT could confirm. 4. Right-sided pleural thickening and trace pleural fluid. Right base airspace disease is favored to represent chronic atelectasis. 5. Aortic atherosclerosis (ICD10-I70.0) and emphysema (ICD10-J43.9).     08/11/2022 Genetic Testing   Negative genetic testing on the CancerNext-Expanded+RNAinsight panel.  The report date is August 11, 2022.  The CancerNext-Expanded gene panel offered by [facility] and includes sequencing and rearrangement analysis for the following 77 genes: AIP, ALK, APC*, ATM*, AXIN2, BAP1, BARD1, BLM, BMPR1A, BRCA1*, BRCA2*, BRIP1*, CDC73, CDH1*, CDK4, CDKN1B, CDKN2A, CHEK2*, CTNNA1, DICER1, FANCC, FH, FLCN, GALNT12, KIF1B, LZTR1, MAX, MEN1, MET, MLH1*, MSH2*, MSH3, MSH6*, MUTYH*, NBN, NF1*, NF2, NTHL1, PALB2*, PHOX2B, PMS2*, POT1, PRKAR1A, PTCH1, PTEN*, RAD51C*, RAD51D*, RB1, RECQL, RET, SDHA, SDHAF2, SDHB, SDHC, SDHD, SMAD4, SMARCA4, SMARCB1, SMARCE1, STK11, SUFU, TMEM127, TP53*, TSC1, TSC2, VHL and XRCC2 (sequencing and deletion/duplication); EGFR, EGLN1, HOXB13, KIT, MITF, PDGFRA, POLD1, and POLE (sequencing only); EPCAM and GREM1 (deletion/duplication only). DNA and RNA analyses performed for * genes.    Lobular carcinoma in situ (LCIS) of right breast  01/20/2020 Mammogram   Diagnostic Mammogram 01/20/20 IMPRESSION: 1.  Stable post lumpectomy changes of the left  breast.   2. Suspicious microcalcifications over the right upper outer quadrant spanning 3.6 cm.   02/02/2020 Initial Biopsy   Diagnosis 02/02/20 Breast, right, needle core biopsy, upper outer quadrant, x clip - LOBULAR CARCINOMA IN SITU WITH PLEOMORPHIC FEATURES AND CALCIFICATIONS, INVOLVING ADENOSIS. SEE NOTE Diagnosis Note Immunohistochemical stain for E-cadherin is negative in the lesional cells, consistent with a lobular phenotype. Immunostains for p63, SMM 1 and calponin do not show evidence of invasive carcinoma.    02/04/2020 Initial Diagnosis   Lobular carcinoma in situ (LCIS) of right breast   03/18/2020 Surgery   RIGHT BREAST LUMPECTOMY WITH RADIOACTIVE SEED LOCALIZATION by Dr Alvira Monday    03/18/2020 Pathology Results   FINAL MICROSCOPIC DIAGNOSIS:   A. BREAST, RIGHT, LUMPECTOMY:  - Pleomorphic lobular carcinoma in situ with calcifications and  underlying complex sclerosing lesion, adenosis and fibroadenomatoid  change.  - Margins of resection are not involved (Closest margins: < 1 mm,  anterior, posterior, inferior and medial).  - Biopsy site.    COMMENT:   P63, Calponin and SMM-1 demonstrate the presence of myoepithelium in the  select focus.    Metastatic breast cancer to bone and brain  05/04/2022 Imaging    IMPRESSION: Cervical spondylosis, as described.   Nonspecific straightening of the expected cervical lordosis.   07/24/2022 Imaging    IMPRESSION: 1. Extensive osseous metastatic disease with pathologic fracture at the base of dens and C2 right lateral mass. Extraosseous tumor at C1 and C2 likely impinging on the right C2 and C3 nerve roots. 2. Degenerative cord impingement at C4-5 to C6-7. Biforaminal impingement at C5-6 and C6-7.   08/02/2022 Initial Diagnosis   Cancer, metastatic to bone ([name])   08/03/2022 PET scan    IMPRESSION: 1. Large volume osseous metastasis. 2.  Low right cervical and probable right axillary nodal metastasis. 3. Subtle  heterogeneous activity throughout the liver with suggestion of small liver lesions (likely new compared to chest CT of 07/16/2020). Findings are overall moderately suspicious for hepatic metastasis. Pre and post contrast abdominal MRI (preferred) or CT could confirm. 4. Right-sided pleural thickening and trace pleural fluid. Right base airspace disease is favored to represent chronic atelectasis. 5. Aortic atherosclerosis (ICD10-I70.0) and emphysema (ICD10-J43.9).     10/26/2022 - 04/12/2023 Chemotherapy   Patient is on Treatment Plan : BREAST Paclitaxel D1,8,15 + Trastuzumab D1 + Pertuzumab D1 q21d x 8 cycles / Trastuzumab D1 + Pertuzumab D1 q21d x 4 cycles     05/03/2023 -  Chemotherapy   Patient is on Treatment Plan : BREAST MAINTENANCE Trastuzumab IV (6) or SQ (600) D1 q21d X 11 Cycles        History of Present Illness   The patient, with a history of metastatic breast cancer, presents for a follow-up after a recent medication adjustment. She has been on Tucatinib for three weeks, currently taking two doses in the morning and two at night. She also takes Capecitabine, which she stops for a week every Wednesday. She reports no adverse effects from these medications, except for increased sleepiness.  The patient experiences diarrhea, which she describes as no worse than when she was on infusion chemo. This occurs twice a day, but not every day. She manages this with a prescription antidiarrheal, taking three doses a day.  She also reports pain in her neck from a previous fusion surgery and occasional discomfort in her spine, which she manages with Tylenol or lidocaine as needed. She has lost weight, which has led to a bone in her lower back sticking out and causing discomfort.  The patient's appetite has decreased, and she has lost about three pounds since her last visit. She is currently taking nutritional supplements, including two protein drinks a day and Kind bars, in addition to her  regular meals.      All other systems were reviewed with the patient and are negative.  MEDICAL HISTORY:  Past Medical History:  Diagnosis Date   Anxiety    Cancer ([name]) 2022   right breast LCIS   Cancer ([name]) 2019   left breast   Depression    Dysrhythmia    SVT, s/p ablation ~ 2012 in at [facility]   Ectopic pregnancy    Family history of breast cancer    Family history of melanoma    History of radiation therapy 04/22/18-06/03/18   Left Breast, left SCV, axilla 50 Gy in 25 fractions, Left breast boost 10 Gy in 5 fractions.    Hypothyroidism    Personal history of radiation therapy    PONV (postoperative nausea and vomiting)    Thyroid disease     SURGICAL HISTORY: Past Surgical History:  Procedure Laterality Date   APPENDECTOMY     BILATERAL SALPINGECTOMY     BREAST BIOPSY Right 2014   fibroadenoma   BREAST BIOPSY Left 01/16/2018   BREAST BIOPSY Right 02/02/2020   LCIS   BREAST BIOPSY Right 08/04/2020   x2 LCIS   BREAST BIOPSY Right 08/13/2020   x2   BREAST EXCISIONAL BIOPSY Left 1990   benign   BREAST EXCISIONAL BIOPSY Right 02/02/2020   LCIS   BREAST LUMPECTOMY Left 01/22/2018   BREAST LUMPECTOMY WITH AXILLARY LYMPH NODE BIOPSY Left 02/21/2018   Procedure: LEFT BREAST LUMPECTOMY WITH AXILLARY LYMPH NODE BIOPSY;  Surgeon:  Almond Lint, MD;  Location: [facility] OR;  Service: General;  Laterality: Left;   BREAST LUMPECTOMY WITH RADIOACTIVE SEED LOCALIZATION Right 03/18/2020   Procedure: RIGHT BREAST LUMPECTOMY WITH RADIOACTIVE SEED LOCALIZATION;  Surgeon: Almond Lint, MD;  Location: [facility] OR;  Service: General;  Laterality: Right;   BREAST SURGERY Left 1993   cyst removed   ENDOMETRIAL ABLATION     EYE SURGERY     GLAUCOMA SURGERY Bilateral    IR IMAGING GUIDED PORT INSERTION  10/16/2022   POSTERIOR CERVICAL FUSION/FORAMINOTOMY N/A 08/17/2022   Procedure: Cervical One-Cervical Four POSTERIOR CERVICAL FUSION,  Cervical One LAMINECTOMY REDUCTION OF Cervical  Two Fracture, Biopsy of Right Cerical Two Pars  Lesion;  Surgeon: Bedelia Person, MD;  Location: [facility] OR;  Service: Neurosurgery;  Laterality: N/A;   POSTERIOR CERVICAL FUSION/FORAMINOTOMY N/A 09/06/2022   Procedure: Posterior Cervical Fusion, Foraminotomy , Cervical Five-Six, Cervical Six-Seven; Extension of  fusion Cervical Four- Thoracic One;  Surgeon: Bedelia Person, MD;  Location: [facility] OR;  Service: Neurosurgery;  Laterality: N/A;   RADIOACTIVE SEED GUIDED EXCISIONAL BREAST BIOPSY Right 04/28/2021   Procedure: RADIOACTIVE SEED GUIDED EXCISIONAL RIGHT BREAST BIOPSY X2;  Surgeon: Almond Lint, MD;  Location: [facility];  Service: General;  Laterality: Right;   RE-EXCISION OF BREAST LUMPECTOMY Left 03/20/2018   Procedure: RE-EXCISION OF BREAST LUMPECTOMY;  Surgeon: Almond Lint, MD;  Location: [facility];  Service: General;  Laterality: Left;    I have reviewed the social history and family history with the patient and they are unchanged from previous note.  ALLERGIES:  is allergic to morphine and codeine.  MEDICATIONS:  Current Outpatient Medications  Medication Sig Dispense Refill   acetaminophen (TYLENOL) 500 MG tablet Take 2 tablets (1,000 mg total) by mouth every 8 (eight) hours. 30 tablet 0   capecitabine (XELODA) 500 MG tablet Take 2 tablets (1,000 mg total) by mouth 2 (two) times daily after a meal. TAKTAKE 2 TABLETS (1,000 MG) TWICE DAILY AFTER A MEAL FOR 7 DAYS ON, THEN 7 DAYS OFF. REPEAT EVERY 14 DAYS 56 tablet 0   citalopram (CELEXA) 40 MG tablet Take 1 tablet (40 mg total) by mouth daily. 30 tablet 3   diphenoxylate-atropine (LOMOTIL) 2.5-0.025 MG tablet Take 1 tablet by mouth 4 (four) times daily as needed for diarrhea or loose stools. 90 tablet 0   levothyroxine (SYNTHROID) 75 MCG tablet Take 1 tablet (75 mcg total) by mouth daily before breakfast. 30 tablet 0   lidocaine (XYLOCAINE) 2 % solution Patient: Mix 1part 2% viscous lidocaine, 1part  H20. Swallow 10mL of diluted mixture, before meals and at bedtime, up to QID 100 mL 3   Loperamide HCl (IMODIUM PO) Take by mouth as needed.     LORazepam (ATIVAN) 0.5 MG tablet Take 1 tablet (0.5 mg total) by mouth every 6 (six) hours as needed for anxiety (agitation). 30 tablet 0   Mouthwashes (MOUTH RINSE) LIQD solution 15 mLs by Mouth Rinse route as needed (for oral care).  0   nicotine (NICODERM CQ - DOSED IN MG/24 HR) 7 mg/24hr patch Place 1 patch (7 mg total) onto the skin daily. 28 patch 0   ondansetron (ZOFRAN) 8 MG tablet Take 1 tablet (8 mg total) by mouth every 8 (eight) hours as needed for nausea or vomiting. 20 tablet 1   OVER THE COUNTER MEDICATION Take 1 tablet by mouth daily. Protandim Supplement     oxyCODONE (OXY IR/ROXICODONE) 5 MG immediate release tablet Take 0.5-1  tablets (2.5-5 mg total) by mouth every 4 (four) hours as needed for moderate pain or severe pain (dyspnea). 30 tablet 0   pregabalin (LYRICA) 25 MG capsule Take 1 capsule (25 mg total) by mouth 2 (two) times daily. 60 capsule 2   senna-docusate (SENOKOT-S) 8.6-50 MG tablet Take 1 tablet by mouth 2 (two) times daily. 60 tablet 0   tucatinib (TUKYSA) 150 MG tablet Take 2 tablets (300 mg total) by mouth 2 (two) times daily. Take as instructed per MD. 120 tablet 2   No current facility-administered medications for this visit.   Facility-Administered Medications Ordered in Other Visits  Medication Dose Route Frequency Provider Last Rate Last Admin   sodium chloride flush (NS) 0.9 % injection 10 mL  10 mL Intracatheter PRN Malachy Mood, MD   10 mL at 05/17/23 1052    PHYSICAL EXAMINATION: ECOG PERFORMANCE STATUS: 2 - Symptomatic, <50% confined to bed  Vitals:   05/17/23 0850  BP: 101/66  Pulse: 95  Resp: 17  Temp: 97.9 F (36.6 C)  SpO2: 98%   Wt Readings from Last 3 Encounters:  05/17/23 75 lb 4.8 oz (34.2 kg)  05/03/23 78 lb 11.2 oz (35.7 kg)  04/12/23 78 lb (35.4 kg)     GENERAL:alert, no  distress and comfortable SKIN: skin color, texture, turgor are normal, no rashes or significant lesions EYES: normal, Conjunctiva are pink and non-injected, sclera clear NECK: supple, thyroid normal size, non-tender, without nodularity LYMPH:  no palpable lymphadenopathy in the cervical, axillary  LUNGS: clear to auscultation and percussion with normal breathing effort HEART: regular rate & rhythm and no murmurs and no lower extremity edema ABDOMEN:abdomen soft, non-tender and normal bowel sounds Musculoskeletal:no cyanosis of digits and no clubbing  NEURO: alert & oriented x 3 with fluent speech, no focal motor/sensory deficits    LABORATORY DATA:  I have reviewed the data as listed    Latest Ref Rng & Units 05/17/2023    8:22 AM 05/03/2023    8:40 AM 04/12/2023    8:00 AM  CBC  WBC 4.0 - 10.5 K/uL 6.2  6.2  4.1   Hemoglobin 12.0 - 15.0 g/dL 59.5  63.8  75.6   Hematocrit 36.0 - 46.0 % 40.9  41.8  39.7   Platelets 150 - 400 K/uL 207  254  239         Latest Ref Rng & Units 05/17/2023    8:22 AM 05/03/2023    8:40 AM 04/12/2023    8:00 AM  CMP  Glucose 70 - 99 mg/dL 433  99  295   BUN 8 - 23 mg/dL 14  11  11    Creatinine 0.44 - 1.00 mg/dL 1.88  4.16  <6.06   Sodium 135 - 145 mmol/L 138  140  140   Potassium 3.5 - 5.1 mmol/L 3.8  3.8  3.7   Chloride 98 - 111 mmol/L 104  106  106   CO2 22 - 32 mmol/L 29  29  27    Calcium 8.9 - 10.3 mg/dL 9.2  9.3  8.6   Total Protein 6.5 - 8.1 g/dL 6.4  6.3  6.2   Total Bilirubin 0.3 - 1.2 mg/dL 0.7  0.5  0.4   Alkaline Phos 38 - 126 U/L 94  105  106   AST 15 - 41 U/L 18  16  27    ALT 0 - 44 U/L 13  13  21        RADIOGRAPHIC STUDIES: I  have personally reviewed the radiological images as listed and agreed with the findings in the report. No results found.    No orders of the defined types were placed in this encounter.  All questions were answered. The patient knows to call the clinic with any problems, questions or concerns. No barriers  to learning was detected. The total time spent in the appointment was 30 minutes.     Malachy Mood, MD 05/17/2023

## 2023-05-17 NOTE — Patient Instructions (Signed)
[facility]  Discharge Instructions: Thank you for choosing [facility] to provide your oncology and hematology care.   If you have a lab appointment with the Cancer Center, please go directly to the Cancer Center and check in at the registration area.   Wear comfortable clothing and clothing appropriate for easy access to any Portacath or PICC line.   We strive to give you quality time with your provider. You may need to reschedule your appointment if you arrive late (15 or more minutes).  Arriving late affects you and other patients whose appointments are after yours.  Also, if you miss three or more appointments without notifying the office, you may be dismissed from the clinic at the provider's discretion.      For prescription refill requests, have your pharmacy contact our office and allow 72 hours for refills to be completed.    Today you received the following chemotherapy and/or immunotherapy agents: trastuzumab       To help prevent nausea and vomiting after your treatment, we encourage you to take your nausea medication as directed.  BELOW ARE SYMPTOMS THAT SHOULD BE REPORTED IMMEDIATELY: *FEVER GREATER THAN 100.4 F (38 C) OR HIGHER *CHILLS OR SWEATING *NAUSEA AND VOMITING THAT IS NOT CONTROLLED WITH YOUR NAUSEA MEDICATION *UNUSUAL SHORTNESS OF BREATH *UNUSUAL BRUISING OR BLEEDING *URINARY PROBLEMS (pain or burning when urinating, or frequent urination) *BOWEL PROBLEMS (unusual diarrhea, constipation, pain near the anus) TENDERNESS IN MOUTH AND THROAT WITH OR WITHOUT PRESENCE OF ULCERS (sore throat, sores in mouth, or a toothache) UNUSUAL RASH, SWELLING OR PAIN  UNUSUAL VAGINAL DISCHARGE OR ITCHING   Items with * indicate a potential emergency and should be followed up as soon as possible or go to the Emergency Department if any problems should occur.  Please show the CHEMOTHERAPY ALERT CARD or IMMUNOTHERAPY ALERT CARD at  check-in to the Emergency Department and triage nurse.  Should you have questions after your visit or need to cancel or reschedule your appointment, please contact [facility]  Dept: [phone]  and follow the prompts.  Office hours are 8:00 a.m. to 4:30 p.m. Monday - Friday. Please note that voicemails left after 4:00 p.m. may not be returned until the following business day.  We are closed weekends and major holidays. You have access to a nurse at all times for urgent questions. Please call the main number to the clinic Dept: [phone] and follow the prompts.   For any non-urgent questions, you may also contact your provider using MyChart. We now offer e-Visits for anyone 18 and older to request care online for non-urgent symptoms. For details visit [email or web address].   Also download the MyChart app! Go to the app store, search "MyChart", open the app, select [facility], and log in with your MyChart username and password.   

## 2023-05-17 NOTE — Assessment & Plan Note (Signed)
-  Diagnosed in 06/2022 --PET scan from 08/03/2022 showed diffuse bone mets  -she underwent cervical laminectomy and fusion by Dr. Maisie Fus on 08/17/2022, biopsy confirmed metastatic breast cancer, ER/PR negative, HER2 positive. -due to cervical cord compression with myelopathy, she underwent a second cervical spine surgery on September 06, 2022, and completed inpatient rehabitation. -She has completed brain, cervical and lumbar spine radiation in Feb 2024 -She was recently hospitalized for altered mental status and confusion, probably secondary to medication -She started first line systemic chemotherapy with weekly Taxol and trastuzumab/Perjeta on 10/25/22. She has tolerated well so far and her pain has improved overall -PET scan from Jan 01, 2023 showed complete metabolic response, no hypermetabolic uptake in diffuse bone lesions, no other new lesions.  She is clinically also doing much better.  Given her excellent response to chemo, we will change her Taxol to 2 weeks on and 1 week off -She Unfortunately developed more more brain metastasis on recent brain MRI. -I personally reviewed her restaging PET scan from April 13, 2023, which showed stable disease, no new lesions. -Due to her new brain metastasis, I changeD her email to Tucatinib, capecitabine and trastuzumab, for better brain penetration. She started in late August 2024, tolerating well so far.  Her case was reviewed in CNS tumor board yesterday, Dr. Basilio Cairo plan to hold on whole brain radiation since she is starting new Her2 antibody and monitor her brain mets closely.  -she is also on Zometa every 3 months

## 2023-05-18 ENCOUNTER — Encounter: Payer: Self-pay | Admitting: Hematology

## 2023-05-18 ENCOUNTER — Encounter: Payer: Self-pay | Admitting: Nurse Practitioner

## 2023-05-22 NOTE — Progress Notes (Signed)
Ms. Zoss presents today for follow-up after completing SRS treatment to her brain on 10/11/2022. She has an MRI scheduled for 10-2--24.   MR Brain W Wo Contrast 05/30/2023  IMPRESSION: Innumerable punctate enhancing metastases throughout the brain without significant interval change. No edema.  Recent neurologic symptoms, if any:  Seizures: {:18581} Headaches: {:18581} Nausea: {:18581} Dizziness/ataxia: {:18581} Difficulty with hand coordination: {:18581} Focal numbness/weakness: {:18581} Visual deficits/changes: {:18581} Confusion/Memory deficits: {:18581}  Other issues of note:

## 2023-05-24 ENCOUNTER — Ambulatory Visit: Payer: BC Managed Care – PPO | Admitting: Hematology

## 2023-05-24 ENCOUNTER — Other Ambulatory Visit: Payer: BC Managed Care – PPO

## 2023-05-24 ENCOUNTER — Ambulatory Visit: Payer: BC Managed Care – PPO

## 2023-05-30 ENCOUNTER — Ambulatory Visit (HOSPITAL_COMMUNITY)
Admission: RE | Admit: 2023-05-30 | Discharge: 2023-05-30 | Disposition: A | Discharge status: Home / Self Care | Payer: BC Managed Care – PPO | Source: Ambulatory Visit | Attending: Radiation Oncology | Admitting: Radiation Oncology

## 2023-05-30 DIAGNOSIS — G319 Degenerative disease of nervous system, unspecified: Secondary | ICD-10-CM | POA: Diagnosis not present

## 2023-05-30 DIAGNOSIS — C801 Malignant (primary) neoplasm, unspecified: Secondary | ICD-10-CM | POA: Diagnosis not present

## 2023-05-30 DIAGNOSIS — C7931 Secondary malignant neoplasm of brain: Secondary | ICD-10-CM | POA: Diagnosis not present

## 2023-05-30 MED ORDER — GADOBUTROL 1 MMOL/ML IV SOLN: 3.0000 mL | Freq: Once | INTRAVENOUS | Status: DC | PRN

## 2023-05-30 MED ORDER — GADOBUTROL 1 MMOL/ML IV SOLN
3.0000 mL | Freq: Once | INTRAVENOUS | Status: AC | PRN
  Administered 2023-05-30: 3 mL via INTRAVENOUS

## 2023-05-30 NOTE — Assessment & Plan Note (Addendum)
She was diagnosed in 12/2017. ER+/PR+/HER2- -She is s/p left breast lumpectomy and adjuvant radiation.  -She started anti-estrogen therapy with letrozole on 05/2018. Tolerating well with no issues -lost f/u after visit in 04/2020 until her recurrence in 07/2022

## 2023-05-30 NOTE — Assessment & Plan Note (Signed)
-  Diagnosed in 06/2022, ER-/PR-/HER2+ --PET scan from 08/03/2022 showed diffuse bone mets  -she underwent cervical laminectomy and fusion by Dr. Maisie Fus on 08/17/2022, biopsy confirmed metastatic breast cancer, ER/PR negative, HER2 positive. -due to cervical cord compression with myelopathy, she underwent a second cervical spine surgery on September 06, 2022, and completed inpatient rehabitation. -She has completed brain, cervical and lumbar spine radiation in Feb 2024 -She was recently hospitalized for altered mental status and confusion, probably secondary to medication -She started first line systemic chemotherapy with weekly Taxol and trastuzumab/Perjeta on 10/25/22. She has tolerated well so far and her pain has improved overall -PET scan from Jan 01, 2023 showed complete metabolic response, no hypermetabolic uptake in diffuse bone lesions, no other new lesions.  She is clinically also doing much better.  Given her excellent response to chemo, we will change her Taxol to 2 weeks on and 1 week off -She Unfortunately developed more more brain metastasis on recent brain MRI. -I personally reviewed her restaging PET scan from April 13, 2023, which showed stable disease, no new lesions. -Due to her new brain metastasis, I changeD her email to Tucatinib, capecitabine and trastuzumab, for better brain penetration. She started in late August 2024, tolerating well so far.  Her case was reviewed in CNS tumor board yesterday, Dr. Basilio Cairo plan to hold on whole brain radiation since she is starting new Her2 antibody and monitor her brain mets closely.  -she is also on Zometa every 3 months

## 2023-05-31 ENCOUNTER — Inpatient Hospital Stay: Payer: BC Managed Care – PPO

## 2023-05-31 ENCOUNTER — Inpatient Hospital Stay: Payer: BC Managed Care – PPO | Attending: Genetic Counselor

## 2023-05-31 ENCOUNTER — Encounter: Payer: Self-pay | Admitting: Hematology

## 2023-05-31 ENCOUNTER — Inpatient Hospital Stay (HOSPITAL_BASED_OUTPATIENT_CLINIC_OR_DEPARTMENT_OTHER): Payer: BC Managed Care – PPO | Admitting: Hematology

## 2023-05-31 VITALS — BP 97/48 | HR 74 | Temp 97.8°F | Resp 14

## 2023-05-31 VITALS — BP 114/69 | HR 87 | Temp 97.5°F | Resp 16 | Wt 75.6 lb

## 2023-05-31 DIAGNOSIS — Z79899 Other long term (current) drug therapy: Secondary | ICD-10-CM | POA: Diagnosis not present

## 2023-05-31 DIAGNOSIS — Z170 Estrogen receptor positive status [ER+]: Secondary | ICD-10-CM | POA: Diagnosis not present

## 2023-05-31 DIAGNOSIS — Z5112 Encounter for antineoplastic immunotherapy: Secondary | ICD-10-CM | POA: Insufficient documentation

## 2023-05-31 DIAGNOSIS — Z95828 Presence of other vascular implants and grafts: Secondary | ICD-10-CM

## 2023-05-31 DIAGNOSIS — Z171 Estrogen receptor negative status [ER-]: Secondary | ICD-10-CM | POA: Diagnosis not present

## 2023-05-31 DIAGNOSIS — C50312 Malignant neoplasm of lower-inner quadrant of left female breast: Secondary | ICD-10-CM

## 2023-05-31 DIAGNOSIS — C7951 Secondary malignant neoplasm of bone: Secondary | ICD-10-CM | POA: Diagnosis not present

## 2023-05-31 DIAGNOSIS — C7931 Secondary malignant neoplasm of brain: Secondary | ICD-10-CM | POA: Diagnosis not present

## 2023-05-31 DIAGNOSIS — C787 Secondary malignant neoplasm of liver and intrahepatic bile duct: Secondary | ICD-10-CM | POA: Insufficient documentation

## 2023-05-31 LAB — CBC WITH DIFFERENTIAL (CANCER CENTER ONLY)
Abs Immature Granulocytes: 0.02 10*3/uL (ref 0.00–0.07)
Basophils Absolute: 0 10*3/uL (ref 0.0–0.1)
Basophils Relative: 1 %
Eosinophils Absolute: 0 10*3/uL (ref 0.0–0.5)
Eosinophils Relative: 1 %
HCT: 43.4 % (ref 36.0–46.0)
Hemoglobin: 14.7 g/dL (ref 12.0–15.0)
Immature Granulocytes: 0 %
Lymphocytes Relative: 15 %
Lymphs Abs: 0.8 10*3/uL (ref 0.7–4.0)
MCH: 32.2 pg (ref 26.0–34.0)
MCHC: 33.9 g/dL (ref 30.0–36.0)
MCV: 95.2 fL (ref 80.0–100.0)
Monocytes Absolute: 0.4 10*3/uL (ref 0.1–1.0)
Monocytes Relative: 8 %
Neutro Abs: 4.1 10*3/uL (ref 1.7–7.7)
Neutrophils Relative %: 75 %
Platelet Count: 239 10*3/uL (ref 150–400)
RBC: 4.56 MIL/uL (ref 3.87–5.11)
RDW: 17.2 % — ABNORMAL HIGH (ref 11.5–15.5)
WBC Count: 5.4 10*3/uL (ref 4.0–10.5)
nRBC: 0 % (ref 0.0–0.2)

## 2023-05-31 LAB — CMP (CANCER CENTER ONLY)
ALT: 14 U/L (ref 0–44)
AST: 22 U/L (ref 15–41)
Albumin: 4.5 g/dL (ref 3.5–5.0)
Alkaline Phosphatase: 103 U/L (ref 38–126)
Anion gap: 6 (ref 5–15)
BUN: 12 mg/dL (ref 8–23)
CO2: 30 mmol/L (ref 22–32)
Calcium: 9.8 mg/dL (ref 8.9–10.3)
Chloride: 103 mmol/L (ref 98–111)
Creatinine: 0.51 mg/dL (ref 0.44–1.00)
GFR, Estimated: >60 mL/min (ref >60)
Glucose, Bld: 127 mg/dL — ABNORMAL HIGH (ref 70–99)
Potassium: 3.6 mmol/L (ref 3.5–5.1)
Sodium: 139 mmol/L (ref 135–145)
Total Bilirubin: 0.8 mg/dL (ref 0.3–1.2)
Total Protein: 6.9 g/dL (ref 6.5–8.1)

## 2023-05-31 MED ORDER — TRASTUZUMAB-ANNS CHEMO 150 MG IV SOLR
6.0000 mg/kg | Freq: Once | INTRAVENOUS | Status: AC
  Administered 2023-05-31: 210 mg via INTRAVENOUS
  Filled 2023-05-31: qty 10

## 2023-05-31 MED ORDER — SODIUM CHLORIDE 0.9 % IV SOLN: Freq: Once | INTRAVENOUS | Status: AC

## 2023-05-31 MED ORDER — DIPHENHYDRAMINE HCL 25 MG PO CAPS
50.0000 mg | ORAL_CAPSULE | Freq: Once | ORAL | Status: AC
  Administered 2023-05-31: 50 mg via ORAL
  Filled 2023-05-31: qty 2

## 2023-05-31 MED ORDER — SODIUM CHLORIDE 0.9% FLUSH
10.0000 mL | INTRAVENOUS | Status: DC | PRN
  Administered 2023-05-31: 10 mL

## 2023-05-31 MED ORDER — HEPARIN SOD (PORK) LOCK FLUSH 100 UNIT/ML IV SOLN
500.0000 [IU] | Freq: Once | INTRAVENOUS | Status: AC | PRN
  Administered 2023-05-31: 500 [IU]

## 2023-05-31 MED ORDER — ACETAMINOPHEN 325 MG PO TABS
650.0000 mg | ORAL_TABLET | Freq: Once | ORAL | Status: AC
  Administered 2023-05-31: 650 mg via ORAL
  Filled 2023-05-31: qty 2

## 2023-05-31 MED ORDER — SODIUM CHLORIDE 0.9% FLUSH
10.0000 mL | Freq: Once | INTRAVENOUS | Status: AC
  Administered 2023-05-31: 10 mL

## 2023-05-31 NOTE — Patient Instructions (Signed)
[facility]  Discharge Instructions: Thank you for choosing [facility] to provide your oncology and hematology care.   If you have a lab appointment with the Cancer Center, please go directly to the Cancer Center and check in at the registration area.   Wear comfortable clothing and clothing appropriate for easy access to any Portacath or PICC line.   We strive to give you quality time with your provider. You may need to reschedule your appointment if you arrive late (15 or more minutes).  Arriving late affects you and other patients whose appointments are after yours.  Also, if you miss three or more appointments without notifying the office, you may be dismissed from the clinic at the provider's discretion.      For prescription refill requests, have your pharmacy contact our office and allow 72 hours for refills to be completed.    Today you received the following chemotherapy and/or immunotherapy agents: Trastuzumab Ernestine Mcmurray)   To help prevent nausea and vomiting after your treatment, we encourage you to take your nausea medication as directed.  BELOW ARE SYMPTOMS THAT SHOULD BE REPORTED IMMEDIATELY: *FEVER GREATER THAN 100.4 F (38 C) OR HIGHER *CHILLS OR SWEATING *NAUSEA AND VOMITING THAT IS NOT CONTROLLED WITH YOUR NAUSEA MEDICATION *UNUSUAL SHORTNESS OF BREATH *UNUSUAL BRUISING OR BLEEDING *URINARY PROBLEMS (pain or burning when urinating, or frequent urination) *BOWEL PROBLEMS (unusual diarrhea, constipation, pain near the anus) TENDERNESS IN MOUTH AND THROAT WITH OR WITHOUT PRESENCE OF ULCERS (sore throat, sores in mouth, or a toothache) UNUSUAL RASH, SWELLING OR PAIN  UNUSUAL VAGINAL DISCHARGE OR ITCHING   Items with * indicate a potential emergency and should be followed up as soon as possible or go to the Emergency Department if any problems should occur.  Please show the CHEMOTHERAPY ALERT CARD or IMMUNOTHERAPY ALERT  CARD at check-in to the Emergency Department and triage nurse.  Should you have questions after your visit or need to cancel or reschedule your appointment, please contact [facility]  Dept: [phone]  and follow the prompts.  Office hours are 8:00 a.m. to 4:30 p.m. Monday - Friday. Please note that voicemails left after 4:00 p.m. may not be returned until the following business day.  We are closed weekends and major holidays. You have access to a nurse at all times for urgent questions. Please call the main number to the clinic Dept: [phone] and follow the prompts.   For any non-urgent questions, you may also contact your provider using MyChart. We now offer e-Visits for anyone 3 and older to request care online for non-urgent symptoms. For details visit [email or web address].   Also download the MyChart app! Go to the app store, search "MyChart", open the app, select [facility], and log in with your MyChart username and password.

## 2023-05-31 NOTE — Progress Notes (Signed)
[facility]   Telephone:(336) [phone] Fax:([phone]) [phone]   Clinic Follow up Note   Patient Care Team: Ardith Dark, MD as PCP - General (Family Medicine) Almond Lint, MD as Consulting Physician (General Surgery) Malachy Mood, MD as Consulting Physician (Hematology) Lonie Peak, MD as Attending Physician (Radiation Oncology) Pollyann Samples, NP as Nurse Practitioner (Nurse Practitioner)  Date of Service:  05/31/2023  CHIEF COMPLAINT: f/u of metastatic breast cancer  CURRENT THERAPY:  Capecitabine, Tucatinib and trastuzumab  Oncology History   Metastatic breast cancer to bone and brain -Diagnosed in 06/2022, ER-/PR-/HER2+ --PET scan from 08/03/2022 showed diffuse bone mets  -she underwent cervical laminectomy and fusion by Dr. Maisie Fus on 08/17/2022, biopsy confirmed metastatic breast cancer, ER/PR negative, HER2 positive. -due to cervical cord compression with myelopathy, she underwent a second cervical spine surgery on September 06, 2022, and completed inpatient rehabitation. -She has completed brain, cervical and lumbar spine radiation in Feb 2024 -She was recently hospitalized for altered mental status and confusion, probably secondary to medication -She started first line systemic chemotherapy with weekly Taxol and trastuzumab/Perjeta on 10/25/22. She has tolerated well so far and her pain has improved overall -PET scan from Jan 01, 2023 showed complete metabolic response, no hypermetabolic uptake in diffuse bone lesions, no other new lesions.  She is clinically also doing much better.  Given her excellent response to chemo, we will change her Taxol to 2 weeks on and 1 week off -She Unfortunately developed more more brain metastasis on recent brain MRI. -I personally reviewed her restaging PET scan from April 13, 2023, which showed stable disease, no new lesions. -Due to her new brain metastasis, I changeD her email to Tucatinib, capecitabine and trastuzumab, for  better brain penetration. She started in late August 2024, tolerating well so far.  Her case was reviewed in CNS tumor board yesterday, Dr. Basilio Cairo plan to hold on whole brain radiation since she is starting new Her2 antibody and monitor her brain mets closely.  -she is also on Zometa every 3 months    Malignant neoplasm of lower-inner quadrant of left breast in female, estrogen receptor positive ([name]) She was diagnosed in 12/2017. ER+/PR+/HER2- -She is s/p left breast lumpectomy and adjuvant radiation.  -She started anti-estrogen therapy with letrozole on 05/2018. Tolerating well with no issues -lost f/u after visit in 04/2020 until her recurrence in 07/2022       Assessment and Plan    Metastatic Breast Cancer Tolerating oral chemotherapy (Capecitabine/Xeloda) and oral antibody (Tucatinib/Tukysa) well. Minimal diarrhea managed with Lomotil. Brain MRI results pending to assess need for radiation therapy. -Continue Capecitabine/Xeloda two tablets twice a day for one week on, one week off. -Continue Tucatinib/Tukysa two tablets twice a day every day. -Continue Lomotil as needed for diarrhea. -Review brain MRI results with Dr. Basilio Cairo on 06/05/2023.  If there is disease progression in her brain, she would likely receive brain radiation. -Plan for whole body PET scan in early Nov 2024 for restaging.  Neuropathic Pain Managed with Lyrica twice a day. -Continue Lyrica twice a day.  General Health Maintenance -Encouraged to maintain small frequent meals and protein drinks for weight stabilization. -Continue physical activity as tolerated, including chair exercises and walking with walker. -Ensure safety measures in place at home to prevent falls, including use of shower chair and handles in shower. -Next infusion scheduled for 06/21/2023. -Follow-up appointment scheduled for 07/12/2023. PET scan one week before 11/14         SUMMARY OF  ONCOLOGIC HISTORY: Oncology History Overview Note   Cancer Staging Malignant neoplasm of lower-inner quadrant of left breast in female, estrogen receptor positive ([name]) Staging form: Breast, AJCC 8th Edition - Clinical stage from 01/16/2018: Stage IA (cT1c, cN0, cM0, G2, ER+, PR+, HER2-) - Signed by Malachy Mood, MD on 01/23/2018 - Pathologic: Stage IA (pT1c, pN1, cM0, G1, ER+, PR+, HER2-) - Signed by Lonie Peak, MD on 04/09/2018     Malignant neoplasm of lower-inner quadrant of left breast in female, estrogen receptor positive ([name])  01/15/2018 Mammogram   Diagnositc Mammogram 01/15/18  IMPRESSION: 1. Suspicious 1.2 x 1.4 x 1.3 cm mixed echogenicity mass left breast 7 o'clock position retroareolar location at the site of palpable concern.. 2. Indeterminate Within the left breast 7:30 o'clock retroareolar location, adjacent to the palpable mass, is a 0.5 x 0.4 x 0.5 cm oval circumscribed hypoechoic mass. 3. Indeterminate calcifications within the lateral left breast. Location of these calcifications is not definitely confirmed on the true lateral view.    01/16/2018 Initial Biopsy   Diagnosis 01/16/18 1. Breast, left, needle core biopsy, 7:30 o'clock (ribbon clip) - FIBROCYSTIC CHANGES WITH SCLEROSING ADENOSIS AND CALCIFICATIONS. - FIBROADENOMATOID CHANGE. - NO MALIGNANCY IDENTIFIED. 2. Breast, left, needle core biopsy, 7 o'clock position (coil clip) - INVASIVE MAMMARY CARCINOMA, MSBR GRADE I/II. - SEE MICROSCOPIC DESCRIPTION Microscopic Comment  ADDENDUM: Immunohistochemistry for E-Cadherin is strongly positive in the tumor consistent with ductal carcinoma. (JDP:ah 01/17/18)   01/16/2018 Receptors her2   Estrogen Receptor: 100%, POSITIVE, STRONG STAINING INTENSITY Progesterone Receptor: 50%, POSITIVE, STRONG STAINING INTENSITY Proliferation Marker Ki67: 20% HER2 Negative   01/16/2018 Cancer Staging   Staging form: Breast, AJCC 8th Edition - Clinical stage from 01/16/2018: Stage IA (cT1c, cN0, cM0, G2, ER+, PR+, HER2-) - Signed by  Malachy Mood, MD on 01/23/2018   01/22/2018 Initial Diagnosis   Malignant neoplasm of lower-inner quadrant of left breast in female, estrogen receptor positive ([name])   02/21/2018 Surgery    LEFT BREAST LUMPECTOMY WITH AXILLARY LYMPH NODE BIOPSY by Dr. Donell Beers  02/21/18   02/21/2018 Pathology Results   Diagnosis 02/21/18 1. Breast, lumpectomy, Left - INVASIVE DUCTAL CARCINOMA, GRADE I, 1.6 CM. - DUCTAL CARCINOMA IN SITU, INTERMEDIATE NUCLEAR GRADE. - ANTERIOR AND MEDIAL RESECTION MARGINS ARE POSITIVE FOR CARCINOMA. - NEGATIVE FOR LYMPHOVASCULAR OR PERINEURAL INVASION. - BACKGROUND BREAST TISSUE WITH FIBROCYSTIC CHANGE, INCLUDING SCLEROSING ADENOSIS. - BIOPSY SITE CHANGES. - SEE ONCOLOGY TABLE. 2. Lymph node, sentinel, biopsy, Left Axillary #1 - METASTATIC BREAST CARCINOMA TO A LYMPH NODE, 1.0 CM IN GREATEST DIMENSION, WITH EXTRANODAL EXTENSION (1/1). 3. Lymph node, sentinel, biopsy, Left Axillary #2 - LYMPH NODE, NEGATIVE FOR CARCINOMA (0/1).    02/21/2018 Miscellaneous   Mammaprint 02/21/18 Low Risk with 10-year risk of recurrnce at 10% -No potential signifcant chemotherapy benefit   03/20/2018 Pathology Results   RE-EXCISION OF BREAST LUMPECTOMY by Dr. Donell Beers  Diagnosis 03/20/18 1. Breast, excision, Left new anterior margin - FIBROCYSTIC CHANGES WITH ADENOSIS AND CALCIFICATIONS. - HEALING BIOPSY SITE. - THERE IS NO EVIDENCE OF MALIGNANCY. 2. Breast, excision, Left new medial margin - FIBROCYSTIC CHANGES WITH ADENOSIS AND CALCIFICATIONS. - HEALING BIOPSY SITE. - THERE IS NO EVIDENCE OF MALIGNANCY. Microscopic Comment 1. -2. The surgical resection margin(s) of the specimen were inked and microscopically evaluated. (JBK:kh 03-22-18)   04/09/2018 Cancer Staging   Staging form: Breast, AJCC 8th Edition - Pathologic: Stage IA (pT1c, pN1, cM0, G1, ER+, PR+, HER2-) - Signed by Lonie Peak, MD on 04/09/2018   04/22/2018 - 06/03/2018  Radiation Therapy   Radaiton with Dr. Basilio Cairo  04/22/18-06/03/18   05/2018 -  Anti-estrogen oral therapy   Letrozole 2.5mg  started 05/2018    Survivorship   Per Santiago Glad, NP    05/04/2022 Imaging    IMPRESSION: Cervical spondylosis, as described.   Nonspecific straightening of the expected cervical lordosis.   07/24/2022 Imaging    IMPRESSION: 1. Extensive osseous metastatic disease with pathologic fracture at the base of dens and C2 right lateral mass. Extraosseous tumor at C1 and C2 likely impinging on the right C2 and C3 nerve roots. 2. Degenerative cord impingement at C4-5 to C6-7. Biforaminal impingement at C5-6 and C6-7.   08/03/2022 PET scan    IMPRESSION: 1. Large volume osseous metastasis. 2. Low right cervical and probable right axillary nodal metastasis. 3. Subtle heterogeneous activity throughout the liver with suggestion of small liver lesions (likely new compared to chest CT of 07/16/2020). Findings are overall moderately suspicious for hepatic metastasis. Pre and post contrast abdominal MRI (preferred) or CT could confirm. 4. Right-sided pleural thickening and trace pleural fluid. Right base airspace disease is favored to represent chronic atelectasis. 5. Aortic atherosclerosis (ICD10-I70.0) and emphysema (ICD10-J43.9).     08/11/2022 Genetic Testing   Negative genetic testing on the CancerNext-Expanded+RNAinsight panel.  The report date is August 11, 2022.  The CancerNext-Expanded gene panel offered by [facility] and includes sequencing and rearrangement analysis for the following 77 genes: AIP, ALK, APC*, ATM*, AXIN2, BAP1, BARD1, BLM, BMPR1A, BRCA1*, BRCA2*, BRIP1*, CDC73, CDH1*, CDK4, CDKN1B, CDKN2A, CHEK2*, CTNNA1, DICER1, FANCC, FH, FLCN, GALNT12, KIF1B, LZTR1, MAX, MEN1, MET, MLH1*, MSH2*, MSH3, MSH6*, MUTYH*, NBN, NF1*, NF2, NTHL1, PALB2*, PHOX2B, PMS2*, POT1, PRKAR1A, PTCH1, PTEN*, RAD51C*, RAD51D*, RB1, RECQL, RET, SDHA, SDHAF2, SDHB, SDHC, SDHD, SMAD4, SMARCA4, SMARCB1, SMARCE1, STK11,  SUFU, TMEM127, TP53*, TSC1, TSC2, VHL and XRCC2 (sequencing and deletion/duplication); EGFR, EGLN1, HOXB13, KIT, MITF, PDGFRA, POLD1, and POLE (sequencing only); EPCAM and GREM1 (deletion/duplication only). DNA and RNA analyses performed for * genes.    Lobular carcinoma in situ (LCIS) of right breast  01/20/2020 Mammogram   Diagnostic Mammogram 01/20/20 IMPRESSION: 1.  Stable post lumpectomy changes of the left breast.   2. Suspicious microcalcifications over the right upper outer quadrant spanning 3.6 cm.   02/02/2020 Initial Biopsy   Diagnosis 02/02/20 Breast, right, needle core biopsy, upper outer quadrant, x clip - LOBULAR CARCINOMA IN SITU WITH PLEOMORPHIC FEATURES AND CALCIFICATIONS, INVOLVING ADENOSIS. SEE NOTE Diagnosis Note Immunohistochemical stain for E-cadherin is negative in the lesional cells, consistent with a lobular phenotype. Immunostains for p63, SMM 1 and calponin do not show evidence of invasive carcinoma.    02/04/2020 Initial Diagnosis   Lobular carcinoma in situ (LCIS) of right breast   03/18/2020 Surgery   RIGHT BREAST LUMPECTOMY WITH RADIOACTIVE SEED LOCALIZATION by Dr Alvira Monday    03/18/2020 Pathology Results   FINAL MICROSCOPIC DIAGNOSIS:   A. BREAST, RIGHT, LUMPECTOMY:  - Pleomorphic lobular carcinoma in situ with calcifications and  underlying complex sclerosing lesion, adenosis and fibroadenomatoid  change.  - Margins of resection are not involved (Closest margins: < 1 mm,  anterior, posterior, inferior and medial).  - Biopsy site.    COMMENT:   P63, Calponin and SMM-1 demonstrate the presence of myoepithelium in the  select focus.    Metastatic breast cancer to bone and brain  05/04/2022 Imaging    IMPRESSION: Cervical spondylosis, as described.   Nonspecific straightening of the expected cervical lordosis.   07/24/2022 Imaging    IMPRESSION: 1.  Extensive osseous metastatic disease with pathologic fracture at the base of dens and C2 right  lateral mass. Extraosseous tumor at C1 and C2 likely impinging on the right C2 and C3 nerve roots. 2. Degenerative cord impingement at C4-5 to C6-7. Biforaminal impingement at C5-6 and C6-7.   08/02/2022 Initial Diagnosis   Cancer, metastatic to bone ([name])   08/03/2022 PET scan    IMPRESSION: 1. Large volume osseous metastasis. 2. Low right cervical and probable right axillary nodal metastasis. 3. Subtle heterogeneous activity throughout the liver with suggestion of small liver lesions (likely new compared to chest CT of 07/16/2020). Findings are overall moderately suspicious for hepatic metastasis. Pre and post contrast abdominal MRI (preferred) or CT could confirm. 4. Right-sided pleural thickening and trace pleural fluid. Right base airspace disease is favored to represent chronic atelectasis. 5. Aortic atherosclerosis (ICD10-I70.0) and emphysema (ICD10-J43.9).     10/26/2022 - 04/12/2023 Chemotherapy   Patient is on Treatment Plan : BREAST Paclitaxel D1,8,15 + Trastuzumab D1 + Pertuzumab D1 q21d x 8 cycles / Trastuzumab D1 + Pertuzumab D1 q21d x 4 cycles     05/03/2023 -  Chemotherapy   Patient is on Treatment Plan : BREAST MAINTENANCE Trastuzumab IV (6) or SQ (600) D1 q21d X 11 Cycles        Discussed the use of AI scribe software for clinical note transcription with the patient, who gave verbal consent to proceed.  History of Present Illness   The patient, a 62 year old female with metastatic breast cancer, presents for a routine follow-up. She experiences minimal diarrhea, which is managed with Lomotil. She also takes Lyrica twice daily for nerve pain related to spinal metastases. She denies taking oxycodone for pain. She reports a good appetite, eating small meals throughout the day, and drinking at least two protein drinks daily. She has noticed a slight weight loss since starting the oral chemotherapy, which initially caused daily diarrhea. She reports a bump on her back from  the bone metastases and occasional neck pain when sitting up for long periods. She denies any skin breakdown. She is mobile with a walker and able to perform activities of daily living independently. She has had safety handles installed in her shower at home.         All other systems were reviewed with the patient and are negative.  MEDICAL HISTORY:  Past Medical History:  Diagnosis Date   Anxiety    Cancer ([name]) 2022   right breast LCIS   Cancer ([name]) 2019   left breast   Depression    Dysrhythmia    SVT, s/p ablation ~ 2012 in at [facility]   Ectopic pregnancy    Family history of breast cancer    Family history of melanoma    History of radiation therapy 04/22/18-06/03/18   Left Breast, left SCV, axilla 50 Gy in 25 fractions, Left breast boost 10 Gy in 5 fractions.    Hypothyroidism    Personal history of radiation therapy    PONV (postoperative nausea and vomiting)    Thyroid disease     SURGICAL HISTORY: Past Surgical History:  Procedure Laterality Date   APPENDECTOMY     BILATERAL SALPINGECTOMY     BREAST BIOPSY Right 2014   fibroadenoma   BREAST BIOPSY Left 01/16/2018   BREAST BIOPSY Right 02/02/2020   LCIS   BREAST BIOPSY Right 08/04/2020   x2 LCIS   BREAST BIOPSY Right 08/13/2020   x2   BREAST EXCISIONAL BIOPSY Left 1990  benign   BREAST EXCISIONAL BIOPSY Right 02/02/2020   LCIS   BREAST LUMPECTOMY Left 01/22/2018   BREAST LUMPECTOMY WITH AXILLARY LYMPH NODE BIOPSY Left 02/21/2018   Procedure: LEFT BREAST LUMPECTOMY WITH AXILLARY LYMPH NODE BIOPSY;  Surgeon: Almond Lint, MD;  Location: [facility] OR;  Service: General;  Laterality: Left;   BREAST LUMPECTOMY WITH RADIOACTIVE SEED LOCALIZATION Right 03/18/2020   Procedure: RIGHT BREAST LUMPECTOMY WITH RADIOACTIVE SEED LOCALIZATION;  Surgeon: Almond Lint, MD;  Location: [facility] OR;  Service: General;  Laterality: Right;   BREAST SURGERY Left 1993   cyst removed   ENDOMETRIAL ABLATION     EYE SURGERY      GLAUCOMA SURGERY Bilateral    IR IMAGING GUIDED PORT INSERTION  10/16/2022   POSTERIOR CERVICAL FUSION/FORAMINOTOMY N/A 08/17/2022   Procedure: Cervical One-Cervical Four POSTERIOR CERVICAL FUSION,  Cervical One LAMINECTOMY REDUCTION OF Cervical Two Fracture, Biopsy of Right Cerical Two Pars  Lesion;  Surgeon: Bedelia Person, MD;  Location: [facility] OR;  Service: Neurosurgery;  Laterality: N/A;   POSTERIOR CERVICAL FUSION/FORAMINOTOMY N/A 09/06/2022   Procedure: Posterior Cervical Fusion, Foraminotomy , Cervical Five-Six, Cervical Six-Seven; Extension of  fusion Cervical Four- Thoracic One;  Surgeon: Bedelia Person, MD;  Location: [facility] OR;  Service: Neurosurgery;  Laterality: N/A;   RADIOACTIVE SEED GUIDED EXCISIONAL BREAST BIOPSY Right 04/28/2021   Procedure: RADIOACTIVE SEED GUIDED EXCISIONAL RIGHT BREAST BIOPSY X2;  Surgeon: Almond Lint, MD;  Location: [facility];  Service: General;  Laterality: Right;   RE-EXCISION OF BREAST LUMPECTOMY Left 03/20/2018   Procedure: RE-EXCISION OF BREAST LUMPECTOMY;  Surgeon: Almond Lint, MD;  Location: [facility];  Service: General;  Laterality: Left;    I have reviewed the social history and family history with the patient and they are unchanged from previous note.  ALLERGIES:  is allergic to morphine and codeine.  MEDICATIONS:  Current Outpatient Medications  Medication Sig Dispense Refill   acetaminophen (TYLENOL) 500 MG tablet Take 2 tablets (1,000 mg total) by mouth every 8 (eight) hours. 30 tablet 0   capecitabine (XELODA) 500 MG tablet Take 2 tablets (1,000 mg total) by mouth 2 (two) times daily after a meal. TAKTAKE 2 TABLETS (1,000 MG) TWICE DAILY AFTER A MEAL FOR 7 DAYS ON, THEN 7 DAYS OFF. REPEAT EVERY 14 DAYS 56 tablet 0   citalopram (CELEXA) 40 MG tablet Take 1 tablet (40 mg total) by mouth daily. 30 tablet 3   diphenoxylate-atropine (LOMOTIL) 2.5-0.025 MG tablet Take 1 tablet by mouth 4 (four) times daily as  needed for diarrhea or loose stools. 90 tablet 0   levothyroxine (SYNTHROID) 75 MCG tablet Take 1 tablet (75 mcg total) by mouth daily before breakfast. 30 tablet 0   lidocaine (XYLOCAINE) 2 % solution Patient: Mix 1part 2% viscous lidocaine, 1part H20. Swallow 10mL of diluted mixture, before meals and at bedtime, up to QID 100 mL 3   Loperamide HCl (IMODIUM PO) Take by mouth as needed.     LORazepam (ATIVAN) 0.5 MG tablet Take 1 tablet (0.5 mg total) by mouth every 6 (six) hours as needed for anxiety (agitation). 30 tablet 0   Mouthwashes (MOUTH RINSE) LIQD solution 15 mLs by Mouth Rinse route as needed (for oral care).  0   nicotine (NICODERM CQ - DOSED IN MG/24 HR) 7 mg/24hr patch Place 1 patch (7 mg total) onto the skin daily. 28 patch 0   ondansetron (ZOFRAN) 8 MG tablet Take 1 tablet (8 mg total) by mouth  every 8 (eight) hours as needed for nausea or vomiting. 20 tablet 1   OVER THE COUNTER MEDICATION Take 1 tablet by mouth daily. Protandim Supplement     oxyCODONE (OXY IR/ROXICODONE) 5 MG immediate release tablet Take 0.5-1 tablets (2.5-5 mg total) by mouth every 4 (four) hours as needed for moderate pain or severe pain (dyspnea). 30 tablet 0   pregabalin (LYRICA) 25 MG capsule Take 1 capsule (25 mg total) by mouth 2 (two) times daily. 60 capsule 2   senna-docusate (SENOKOT-S) 8.6-50 MG tablet Take 1 tablet by mouth 2 (two) times daily. 60 tablet 0   tucatinib (TUKYSA) 150 MG tablet Take 2 tablets (300 mg total) by mouth 2 (two) times daily. Take as instructed per MD. 120 tablet 2   No current facility-administered medications for this visit.   Facility-Administered Medications Ordered in Other Visits  Medication Dose Route Frequency Provider Last Rate Last Admin   sodium chloride flush (NS) 0.9 % injection 10 mL  10 mL Intracatheter PRN Malachy Mood, MD   10 mL at 05/31/23 1032    PHYSICAL EXAMINATION: ECOG PERFORMANCE STATUS: 2 - Symptomatic, <50% confined to bed  Vitals:    05/31/23 0850  BP: 114/69  Pulse: 87  Resp: 16  Temp: (!) 97.5 F (36.4 C)  SpO2: 96%   Wt Readings from Last 3 Encounters:  05/31/23 75 lb 9.6 oz (34.3 kg)  05/17/23 75 lb 4.8 oz (34.2 kg)  05/03/23 78 lb 11.2 oz (35.7 kg)     GENERAL:alert, no distress and comfortable SKIN: skin color, texture, turgor are normal, no rashes or significant lesions EYES: normal, Conjunctiva are pink and non-injected, sclera clear NECK: supple, thyroid normal size, non-tender, without nodularity LYMPH:  no palpable lymphadenopathy in the cervical, axillary  LUNGS: clear to auscultation and percussion with normal breathing effort HEART: regular rate & rhythm and no murmurs and no lower extremity edema ABDOMEN:abdomen soft, non-tender and normal bowel sounds Musculoskeletal:no cyanosis of digits and no clubbing  NEURO: alert & oriented x 3 with fluent speech, no focal motor/sensory deficits    LABORATORY DATA:  I have reviewed the data as listed    Latest Ref Rng & Units 05/31/2023    8:20 AM 05/17/2023    8:22 AM 05/03/2023    8:40 AM  CBC  WBC 4.0 - 10.5 K/uL 5.4  6.2  6.2   Hemoglobin 12.0 - 15.0 g/dL 78.2  95.6  21.3   Hematocrit 36.0 - 46.0 % 43.4  40.9  41.8   Platelets 150 - 400 K/uL 239  207  254         Latest Ref Rng & Units 05/31/2023    8:20 AM 05/17/2023    8:22 AM 05/03/2023    8:40 AM  CMP  Glucose 70 - 99 mg/dL 086  578  99   BUN 8 - 23 mg/dL 12  14  11    Creatinine 0.44 - 1.00 mg/dL 4.69  6.29  5.28   Sodium 135 - 145 mmol/L 139  138  140   Potassium 3.5 - 5.1 mmol/L 3.6  3.8  3.8   Chloride 98 - 111 mmol/L 103  104  106   CO2 22 - 32 mmol/L 30  29  29    Calcium 8.9 - 10.3 mg/dL 9.8  9.2  9.3   Total Protein 6.5 - 8.1 g/dL 6.9  6.4  6.3   Total Bilirubin 0.3 - 1.2 mg/dL 0.8  0.7  0.5  Alkaline Phos 38 - 126 U/L 103  94  105   AST 15 - 41 U/L 22  18  16    ALT 0 - 44 U/L 14  13  13        RADIOGRAPHIC STUDIES: I have personally reviewed the radiological images as  listed and agreed with the findings in the report. MR Brain W Wo Contrast  Result Date: 05/31/2023 CLINICAL DATA:  Brain metastases, assess treatment response. Breast cancer. EXAM: MRI HEAD WITHOUT AND WITH CONTRAST TECHNIQUE: Multiplanar, multiecho pulse sequences of the brain and surrounding structures were obtained without and with intravenous contrast. CONTRAST:  3mL GADAVIST GADOBUTROL 1 MMOL/ML IV SOLN COMPARISON:  Head MRI 03/31/2023 FINDINGS: Brain: A 7 mm peripherally enhancing lesion in the left cerebellopontine angle is unchanged (series 1200, image 149). Innumerable punctate enhancing lesions are again seen throughout the brain, primarily measuring 3 mm or less in size. The greatest concentration of lesions is again noted to be within the basal ganglia bilaterally, however scattered lesions are also present peripherally throughout both cerebral hemispheres and in the cerebellum. Some lesions are minimally more or less conspicuous than on the prior study, however no convincing overall progression is evident. There is no associated edema. No acute infarct, intracranial hemorrhage, midline shift, hydrocephalus, or extra-axial fluid collection is evident. Scattered punctate T2 hyperintensities in the cerebral white matter are unchanged and nonspecific but compatible with minimal chronic small vessel ischemic disease. There is mild generalized cerebral atrophy. Vascular: Major intracranial arterial flow voids are preserved. Unchanged absence of a normal flow void in the left sigmoid sinus which may reflect slow flow or occlusion. Skull and upper cervical spine: Similar appearance of diffusely heterogeneous bone marrow signal with multiple T1 hypointense skull lesions including in the clivus in the setting of known osseous metastatic disease. Partially visualized extensive posterior spinal fusion in the cervical spine. Sinuses/Orbits: Unremarkable orbits. Clear paranasal sinuses. Persistent moderate right  mastoid effusion. Other: None. IMPRESSION: Innumerable punctate enhancing metastases throughout the brain without significant interval change. No edema. Electronically Signed   By: Sebastian Ache M.D.   On: 05/31/2023 09:09      Orders Placed This Encounter  Procedures   NM PET Image Restage (PS) Skull Base to Thigh (F-18 FDG)    Standing Status:   Future    Standing Expiration Date:   05/30/2024    Order Specific Question:   If indicated for the ordered procedure, I authorize the administration of a radiopharmaceutical per Radiology protocol    Answer:   Yes    Order Specific Question:   Preferred imaging location?    Answer:   [facility]    Order Specific Question:   Radiology Contrast Protocol - do NOT remove file path    Answer:   [email or web address]   All questions were answered. The patient knows to call the clinic with any problems, questions or concerns. No barriers to learning was detected. The total time spent in the appointment was 25 minutes.     Malachy Mood, MD 05/31/2023

## 2023-06-04 ENCOUNTER — Other Ambulatory Visit: Payer: Self-pay | Admitting: Radiation Therapy

## 2023-06-04 ENCOUNTER — Inpatient Hospital Stay: Payer: BC Managed Care – PPO

## 2023-06-04 DIAGNOSIS — C7931 Secondary malignant neoplasm of brain: Secondary | ICD-10-CM

## 2023-06-05 ENCOUNTER — Encounter: Payer: Self-pay | Admitting: Radiation Oncology

## 2023-06-05 ENCOUNTER — Ambulatory Visit
Admission: RE | Admit: 2023-06-05 | Discharge: 2023-06-05 | Disposition: A | Discharge status: Home / Self Care | Payer: BC Managed Care – PPO | Source: Ambulatory Visit | Attending: Radiation Oncology | Admitting: Radiation Oncology

## 2023-06-05 VITALS — BP 109/77 | HR 75 | Temp 97.6°F | Resp 18 | Ht 62.0 in | Wt 76.0 lb

## 2023-06-05 DIAGNOSIS — Z79899 Other long term (current) drug therapy: Secondary | ICD-10-CM | POA: Diagnosis not present

## 2023-06-05 DIAGNOSIS — F17210 Nicotine dependence, cigarettes, uncomplicated: Secondary | ICD-10-CM | POA: Insufficient documentation

## 2023-06-05 DIAGNOSIS — I2510 Atherosclerotic heart disease of native coronary artery without angina pectoris: Secondary | ICD-10-CM | POA: Insufficient documentation

## 2023-06-05 DIAGNOSIS — C7951 Secondary malignant neoplasm of bone: Secondary | ICD-10-CM | POA: Diagnosis not present

## 2023-06-05 DIAGNOSIS — Z79890 Hormone replacement therapy (postmenopausal): Secondary | ICD-10-CM | POA: Insufficient documentation

## 2023-06-05 DIAGNOSIS — I700 Atherosclerosis of aorta: Secondary | ICD-10-CM | POA: Diagnosis not present

## 2023-06-05 DIAGNOSIS — C7931 Secondary malignant neoplasm of brain: Secondary | ICD-10-CM | POA: Diagnosis not present

## 2023-06-05 DIAGNOSIS — C50312 Malignant neoplasm of lower-inner quadrant of left female breast: Secondary | ICD-10-CM | POA: Diagnosis not present

## 2023-06-05 DIAGNOSIS — Z170 Estrogen receptor positive status [ER+]: Secondary | ICD-10-CM | POA: Diagnosis not present

## 2023-06-05 NOTE — Progress Notes (Signed)
Radiation Oncology         [phone] ________________________________  Name: Martha [name] MRN: [id number]  Date: 06/05/2023  DOB: June 07, 1961  Follow-Up Visit Note  Outpatient  CC: Ardith Dark, MD  Ardith Dark, MD  Diagnosis and Prior Radiotherapy: C79.31    ICD-10-CM   1. Metastasis to brain ([name])  C79.31      CHIEF COMPLAINT: Here for follow-up and surveillance of brain and bone cancer.  [name] presents today for follow-up after completing SRS treatment to her brain on 10/11/2022. She has an MRI scheduled for 10-2--24.    Patient returns today to review her most recent MRI of the brain from 05/30/2023. Imaging demonstrated innumerable punctate enhancing metastases throughout the brain without significant interval change. She is here with her supportive husband today. She states she is doing "great" and denies any headaches, seizures, dizziness, or changes in her memory, or cognitive deficits.   Recent neurologic symptoms, if any:  Seizures: no Headaches: no Nausea: no Dizziness/ataxia: no Difficulty with hand coordination: no Focal numbness/weakness: yes, chronic neuropathy in right hand Visual deficits/changes: no Confusion/Memory deficits: no                          ALLERGIES:  is allergic to morphine and codeine.  Meds: Current Outpatient Medications  Medication Sig Dispense Refill   acetaminophen (TYLENOL) 500 MG tablet Take 2 tablets (1,000 mg total) by mouth every 8 (eight) hours. 30 tablet 0   capecitabine (XELODA) 500 MG tablet Take 2 tablets (1,000 mg total) by mouth 2 (two) times daily after a meal. TAKTAKE 2 TABLETS (1,000 MG) TWICE DAILY AFTER A MEAL FOR 7 DAYS ON, THEN 7 DAYS OFF. REPEAT EVERY 14 DAYS 56 tablet 0   citalopram (CELEXA) 40 MG tablet Take 1 tablet (40 mg total) by mouth daily. 30 tablet 3   diphenoxylate-atropine (LOMOTIL) 2.5-0.025 MG tablet Take 1 tablet by mouth 4 (four) times daily as needed for diarrhea or loose  stools. 90 tablet 0   levothyroxine (SYNTHROID) 75 MCG tablet Take 1 tablet (75 mcg total) by mouth daily before breakfast. 30 tablet 0   Loperamide HCl (IMODIUM PO) Take by mouth as needed.     LORazepam (ATIVAN) 0.5 MG tablet Take 1 tablet (0.5 mg total) by mouth every 6 (six) hours as needed for anxiety (agitation). 30 tablet 0   Mouthwashes (MOUTH RINSE) LIQD solution 15 mLs by Mouth Rinse route as needed (for oral care).  0   nicotine (NICODERM CQ - DOSED IN MG/24 HR) 7 mg/24hr patch Place 1 patch (7 mg total) onto the skin daily. 28 patch 0   ondansetron (ZOFRAN) 8 MG tablet Take 1 tablet (8 mg total) by mouth every 8 (eight) hours as needed for nausea or vomiting. 20 tablet 1   OVER THE COUNTER MEDICATION Take 1 tablet by mouth daily. Protandim Supplement     oxyCODONE (OXY IR/ROXICODONE) 5 MG immediate release tablet Take 0.5-1 tablets (2.5-5 mg total) by mouth every 4 (four) hours as needed for moderate pain or severe pain (dyspnea). 30 tablet 0   pregabalin (LYRICA) 25 MG capsule Take 1 capsule (25 mg total) by mouth 2 (two) times daily. 60 capsule 2   senna-docusate (SENOKOT-S) 8.6-50 MG tablet Take 1 tablet by mouth 2 (two) times daily. 60 tablet 0   tucatinib (TUKYSA) 150 MG tablet Take 2 tablets (300 mg total) by mouth 2 (two) times daily. Take as  diffusely heterogeneous bone marrow signal with multiple T1 hypointense skull lesions including in the clivus in the setting of known osseous metastatic disease. Partially visualized extensive posterior spinal fusion in the cervical spine. Sinuses/Orbits: Unremarkable orbits. Clear paranasal sinuses. Persistent moderate right mastoid effusion. Other: None. IMPRESSION: Innumerable punctate enhancing metastases throughout the brain without significant interval change. No edema. Electronically Signed   By: Sebastian Ache M.D.   On: 05/31/2023 09:09   ECHOCARDIOGRAM COMPLETE  Result Date: 05/14/2023    ECHOCARDIOGRAM REPORT   Patient Name:   Martha [name] Date of Exam: 05/14/2023 Medical Rec #:  [id number]             Height:       62.0 in Accession #:    [id number]            Weight:       78.7 lb Date of Birth:  Mar 16, 1961             BSA:          1.286 m  Patient Age:    62 years              BP:           104/66 mmHg Patient Gender: F                     HR:           94 bpm. Exam Location:  Outpatient Procedure: 2D Echo, 3D Echo, Cardiac Doppler, Color Doppler and Strain Analysis Indications:    Z51.11 Encounter for antineoplastic chemotheraphy  History:        Patient has prior history of Echocardiogram examinations, most                 recent 02/16/2023. Signs/Symptoms:Altered Mental Status; Risk                 Factors:Current Smoker. Meatstatic breast cancer. Brain mets.  Sonographer:    Sheralyn Boatman RDCS Referring Phys: [id number] [facility]  Sonographer Comments: Technically difficult study due to poor echo windows and suboptimal parasternal window. Patient has extremely thin habitus. IMPRESSIONS  1. Left ventricular ejection fraction, by estimation, is 55 to 60%. The left ventricle has normal function. The left ventricle has no regional wall motion abnormalities. Left ventricular diastolic parameters are consistent with Grade I diastolic dysfunction (impaired relaxation). The average left ventricular global longitudinal strain is -15.9 %. The global longitudinal strain is normal.  2. Right ventricular systolic function is normal. The right ventricular size is normal. There is normal pulmonary artery systolic pressure.  3. The mitral valve is abnormal. No evidence of mitral valve regurgitation. No evidence of mitral stenosis.  4. The aortic valve is tricuspid. There is mild calcification of the aortic valve. There is mild thickening of the aortic valve. Aortic valve regurgitation is not visualized. Aortic valve sclerosis is present, with no evidence of aortic valve stenosis.  5. Prominent descending thoracic aorta seen on para sternal images with calcific atherosclerosis . There is Severe (Grade IV) atheroma plaque involving the descending aorta. FINDINGS  Left Ventricle: Left ventricular ejection fraction, by estimation, is 55 to 60%. The left ventricle has normal  function. The left ventricle has no regional wall motion abnormalities. The average left ventricular global longitudinal strain is -15.9 %. The global longitudinal strain is normal. The left ventricular internal cavity size was small. There is no left ventricular hypertrophy. Left ventricular diastolic parameters are consistent  Radiation Oncology         [phone] ________________________________  Name: Martha [name] MRN: [id number]  Date: 06/05/2023  DOB: June 07, 1961  Follow-Up Visit Note  Outpatient  CC: Ardith Dark, MD  Ardith Dark, MD  Diagnosis and Prior Radiotherapy: C79.31    ICD-10-CM   1. Metastasis to brain ([name])  C79.31      CHIEF COMPLAINT: Here for follow-up and surveillance of brain and bone cancer.  [name] presents today for follow-up after completing SRS treatment to her brain on 10/11/2022. She has an MRI scheduled for 10-2--24.    Patient returns today to review her most recent MRI of the brain from 05/30/2023. Imaging demonstrated innumerable punctate enhancing metastases throughout the brain without significant interval change. She is here with her supportive husband today. She states she is doing "great" and denies any headaches, seizures, dizziness, or changes in her memory, or cognitive deficits.   Recent neurologic symptoms, if any:  Seizures: no Headaches: no Nausea: no Dizziness/ataxia: no Difficulty with hand coordination: no Focal numbness/weakness: yes, chronic neuropathy in right hand Visual deficits/changes: no Confusion/Memory deficits: no                          ALLERGIES:  is allergic to morphine and codeine.  Meds: Current Outpatient Medications  Medication Sig Dispense Refill   acetaminophen (TYLENOL) 500 MG tablet Take 2 tablets (1,000 mg total) by mouth every 8 (eight) hours. 30 tablet 0   capecitabine (XELODA) 500 MG tablet Take 2 tablets (1,000 mg total) by mouth 2 (two) times daily after a meal. TAKTAKE 2 TABLETS (1,000 MG) TWICE DAILY AFTER A MEAL FOR 7 DAYS ON, THEN 7 DAYS OFF. REPEAT EVERY 14 DAYS 56 tablet 0   citalopram (CELEXA) 40 MG tablet Take 1 tablet (40 mg total) by mouth daily. 30 tablet 3   diphenoxylate-atropine (LOMOTIL) 2.5-0.025 MG tablet Take 1 tablet by mouth 4 (four) times daily as needed for diarrhea or loose  stools. 90 tablet 0   levothyroxine (SYNTHROID) 75 MCG tablet Take 1 tablet (75 mcg total) by mouth daily before breakfast. 30 tablet 0   Loperamide HCl (IMODIUM PO) Take by mouth as needed.     LORazepam (ATIVAN) 0.5 MG tablet Take 1 tablet (0.5 mg total) by mouth every 6 (six) hours as needed for anxiety (agitation). 30 tablet 0   Mouthwashes (MOUTH RINSE) LIQD solution 15 mLs by Mouth Rinse route as needed (for oral care).  0   nicotine (NICODERM CQ - DOSED IN MG/24 HR) 7 mg/24hr patch Place 1 patch (7 mg total) onto the skin daily. 28 patch 0   ondansetron (ZOFRAN) 8 MG tablet Take 1 tablet (8 mg total) by mouth every 8 (eight) hours as needed for nausea or vomiting. 20 tablet 1   OVER THE COUNTER MEDICATION Take 1 tablet by mouth daily. Protandim Supplement     oxyCODONE (OXY IR/ROXICODONE) 5 MG immediate release tablet Take 0.5-1 tablets (2.5-5 mg total) by mouth every 4 (four) hours as needed for moderate pain or severe pain (dyspnea). 30 tablet 0   pregabalin (LYRICA) 25 MG capsule Take 1 capsule (25 mg total) by mouth 2 (two) times daily. 60 capsule 2   senna-docusate (SENOKOT-S) 8.6-50 MG tablet Take 1 tablet by mouth 2 (two) times daily. 60 tablet 0   tucatinib (TUKYSA) 150 MG tablet Take 2 tablets (300 mg total) by mouth 2 (two) times daily. Take as  Radiation Oncology         [phone] ________________________________  Name: Martha [name] MRN: [id number]  Date: 06/05/2023  DOB: June 07, 1961  Follow-Up Visit Note  Outpatient  CC: Ardith Dark, MD  Ardith Dark, MD  Diagnosis and Prior Radiotherapy: C79.31    ICD-10-CM   1. Metastasis to brain ([name])  C79.31      CHIEF COMPLAINT: Here for follow-up and surveillance of brain and bone cancer.  [name] presents today for follow-up after completing SRS treatment to her brain on 10/11/2022. She has an MRI scheduled for 10-2--24.    Patient returns today to review her most recent MRI of the brain from 05/30/2023. Imaging demonstrated innumerable punctate enhancing metastases throughout the brain without significant interval change. She is here with her supportive husband today. She states she is doing "great" and denies any headaches, seizures, dizziness, or changes in her memory, or cognitive deficits.   Recent neurologic symptoms, if any:  Seizures: no Headaches: no Nausea: no Dizziness/ataxia: no Difficulty with hand coordination: no Focal numbness/weakness: yes, chronic neuropathy in right hand Visual deficits/changes: no Confusion/Memory deficits: no                          ALLERGIES:  is allergic to morphine and codeine.  Meds: Current Outpatient Medications  Medication Sig Dispense Refill   acetaminophen (TYLENOL) 500 MG tablet Take 2 tablets (1,000 mg total) by mouth every 8 (eight) hours. 30 tablet 0   capecitabine (XELODA) 500 MG tablet Take 2 tablets (1,000 mg total) by mouth 2 (two) times daily after a meal. TAKTAKE 2 TABLETS (1,000 MG) TWICE DAILY AFTER A MEAL FOR 7 DAYS ON, THEN 7 DAYS OFF. REPEAT EVERY 14 DAYS 56 tablet 0   citalopram (CELEXA) 40 MG tablet Take 1 tablet (40 mg total) by mouth daily. 30 tablet 3   diphenoxylate-atropine (LOMOTIL) 2.5-0.025 MG tablet Take 1 tablet by mouth 4 (four) times daily as needed for diarrhea or loose  stools. 90 tablet 0   levothyroxine (SYNTHROID) 75 MCG tablet Take 1 tablet (75 mcg total) by mouth daily before breakfast. 30 tablet 0   Loperamide HCl (IMODIUM PO) Take by mouth as needed.     LORazepam (ATIVAN) 0.5 MG tablet Take 1 tablet (0.5 mg total) by mouth every 6 (six) hours as needed for anxiety (agitation). 30 tablet 0   Mouthwashes (MOUTH RINSE) LIQD solution 15 mLs by Mouth Rinse route as needed (for oral care).  0   nicotine (NICODERM CQ - DOSED IN MG/24 HR) 7 mg/24hr patch Place 1 patch (7 mg total) onto the skin daily. 28 patch 0   ondansetron (ZOFRAN) 8 MG tablet Take 1 tablet (8 mg total) by mouth every 8 (eight) hours as needed for nausea or vomiting. 20 tablet 1   OVER THE COUNTER MEDICATION Take 1 tablet by mouth daily. Protandim Supplement     oxyCODONE (OXY IR/ROXICODONE) 5 MG immediate release tablet Take 0.5-1 tablets (2.5-5 mg total) by mouth every 4 (four) hours as needed for moderate pain or severe pain (dyspnea). 30 tablet 0   pregabalin (LYRICA) 25 MG capsule Take 1 capsule (25 mg total) by mouth 2 (two) times daily. 60 capsule 2   senna-docusate (SENOKOT-S) 8.6-50 MG tablet Take 1 tablet by mouth 2 (two) times daily. 60 tablet 0   tucatinib (TUKYSA) 150 MG tablet Take 2 tablets (300 mg total) by mouth 2 (two) times daily. Take as  diffusely heterogeneous bone marrow signal with multiple T1 hypointense skull lesions including in the clivus in the setting of known osseous metastatic disease. Partially visualized extensive posterior spinal fusion in the cervical spine. Sinuses/Orbits: Unremarkable orbits. Clear paranasal sinuses. Persistent moderate right mastoid effusion. Other: None. IMPRESSION: Innumerable punctate enhancing metastases throughout the brain without significant interval change. No edema. Electronically Signed   By: Sebastian Ache M.D.   On: 05/31/2023 09:09   ECHOCARDIOGRAM COMPLETE  Result Date: 05/14/2023    ECHOCARDIOGRAM REPORT   Patient Name:   Martha [name] Date of Exam: 05/14/2023 Medical Rec #:  [id number]             Height:       62.0 in Accession #:    [id number]            Weight:       78.7 lb Date of Birth:  Mar 16, 1961             BSA:          1.286 m  Patient Age:    62 years              BP:           104/66 mmHg Patient Gender: F                     HR:           94 bpm. Exam Location:  Outpatient Procedure: 2D Echo, 3D Echo, Cardiac Doppler, Color Doppler and Strain Analysis Indications:    Z51.11 Encounter for antineoplastic chemotheraphy  History:        Patient has prior history of Echocardiogram examinations, most                 recent 02/16/2023. Signs/Symptoms:Altered Mental Status; Risk                 Factors:Current Smoker. Meatstatic breast cancer. Brain mets.  Sonographer:    Sheralyn Boatman RDCS Referring Phys: [id number] [facility]  Sonographer Comments: Technically difficult study due to poor echo windows and suboptimal parasternal window. Patient has extremely thin habitus. IMPRESSIONS  1. Left ventricular ejection fraction, by estimation, is 55 to 60%. The left ventricle has normal function. The left ventricle has no regional wall motion abnormalities. Left ventricular diastolic parameters are consistent with Grade I diastolic dysfunction (impaired relaxation). The average left ventricular global longitudinal strain is -15.9 %. The global longitudinal strain is normal.  2. Right ventricular systolic function is normal. The right ventricular size is normal. There is normal pulmonary artery systolic pressure.  3. The mitral valve is abnormal. No evidence of mitral valve regurgitation. No evidence of mitral stenosis.  4. The aortic valve is tricuspid. There is mild calcification of the aortic valve. There is mild thickening of the aortic valve. Aortic valve regurgitation is not visualized. Aortic valve sclerosis is present, with no evidence of aortic valve stenosis.  5. Prominent descending thoracic aorta seen on para sternal images with calcific atherosclerosis . There is Severe (Grade IV) atheroma plaque involving the descending aorta. FINDINGS  Left Ventricle: Left ventricular ejection fraction, by estimation, is 55 to 60%. The left ventricle has normal  function. The left ventricle has no regional wall motion abnormalities. The average left ventricular global longitudinal strain is -15.9 %. The global longitudinal strain is normal. The left ventricular internal cavity size was small. There is no left ventricular hypertrophy. Left ventricular diastolic parameters are consistent

## 2023-06-11 ENCOUNTER — Other Ambulatory Visit: Payer: Self-pay

## 2023-06-11 DIAGNOSIS — M792 Neuralgia and neuritis, unspecified: Secondary | ICD-10-CM

## 2023-06-11 DIAGNOSIS — Z515 Encounter for palliative care: Secondary | ICD-10-CM

## 2023-06-11 DIAGNOSIS — C7951 Secondary malignant neoplasm of bone: Secondary | ICD-10-CM

## 2023-06-11 MED ORDER — PREGABALIN 25 MG PO CAPS: 25.0000 mg | ORAL_CAPSULE | Freq: Two times a day (BID) | ORAL | 2 refills | Status: DC

## 2023-06-12 ENCOUNTER — Other Ambulatory Visit: Payer: Self-pay | Admitting: Nurse Practitioner

## 2023-06-12 DIAGNOSIS — Z515 Encounter for palliative care: Secondary | ICD-10-CM

## 2023-06-12 DIAGNOSIS — R197 Diarrhea, unspecified: Secondary | ICD-10-CM

## 2023-06-12 DIAGNOSIS — C7951 Secondary malignant neoplasm of bone: Secondary | ICD-10-CM

## 2023-06-12 MED ORDER — DIPHENOXYLATE-ATROPINE 2.5-0.025 MG PO TABS: 1.0000 | ORAL_TABLET | Freq: Four times a day (QID) | ORAL | 0 refills | Status: DC | PRN

## 2023-06-14 ENCOUNTER — Ambulatory Visit: Payer: BC Managed Care – PPO | Admitting: Hematology

## 2023-06-14 ENCOUNTER — Other Ambulatory Visit: Payer: BC Managed Care – PPO

## 2023-06-14 ENCOUNTER — Ambulatory Visit: Payer: BC Managed Care – PPO

## 2023-06-20 NOTE — Progress Notes (Unsigned)
Patient Care Team: Ardith Dark, MD as PCP - General (Family Medicine) Almond Lint, MD as Consulting Physician (General Surgery) Malachy Mood, MD as Consulting Physician (Hematology) Lonie Peak, MD as Attending Physician (Radiation Oncology) Pollyann Samples, NP as Nurse Practitioner (Nurse Practitioner)   CHIEF COMPLAINT: Follow up metastatic breast cancer   Oncology History Overview Note  Cancer Staging Malignant neoplasm of lower-inner quadrant of left breast in female, estrogen receptor positive [facility]) Staging form: Breast, AJCC 8th Edition - Clinical stage from 01/16/2018: Stage IA (cT1c, cN0, cM0, G2, ER+, PR+, HER2-) - Signed by Malachy Mood, MD on 01/23/2018 - Pathologic: Stage IA (pT1c, pN1, cM0, G1, ER+, PR+, HER2-) - Signed by Lonie Peak, MD on 04/09/2018     Malignant neoplasm of lower-inner quadrant of left breast in female, estrogen receptor positive ([name])  01/15/2018 Mammogram   Diagnositc Mammogram 01/15/18  IMPRESSION: 1. Suspicious 1.2 x 1.4 x 1.3 cm mixed echogenicity mass left breast 7 o'clock position retroareolar location at the site of palpable concern.. 2. Indeterminate Within the left breast 7:30 o'clock retroareolar location, adjacent to the palpable mass, is a 0.5 x 0.4 x 0.5 cm oval circumscribed hypoechoic mass. 3. Indeterminate calcifications within the lateral left breast. Location of these calcifications is not definitely confirmed on the true lateral view.    01/16/2018 Initial Biopsy   Diagnosis 01/16/18 1. Breast, left, needle core biopsy, 7:30 o'clock (ribbon clip) - FIBROCYSTIC CHANGES WITH SCLEROSING ADENOSIS AND CALCIFICATIONS. - FIBROADENOMATOID CHANGE. - NO MALIGNANCY IDENTIFIED. 2. Breast, left, needle core biopsy, 7 o'clock position (coil clip) - INVASIVE MAMMARY CARCINOMA, MSBR GRADE I/II. - SEE MICROSCOPIC DESCRIPTION Microscopic Comment  ADDENDUM: Immunohistochemistry for E-Cadherin is strongly positive in the tumor  consistent with ductal carcinoma. (JDP:ah 01/17/18)   01/16/2018 Receptors her2   Estrogen Receptor: 100%, POSITIVE, STRONG STAINING INTENSITY Progesterone Receptor: 50%, POSITIVE, STRONG STAINING INTENSITY Proliferation Marker Ki67: 20% HER2 Negative   01/16/2018 Cancer Staging   Staging form: Breast, AJCC 8th Edition - Clinical stage from 01/16/2018: Stage IA (cT1c, cN0, cM0, G2, ER+, PR+, HER2-) - Signed by Malachy Mood, MD on 01/23/2018   01/22/2018 Initial Diagnosis   Malignant neoplasm of lower-inner quadrant of left breast in female, estrogen receptor positive ([name])   02/21/2018 Surgery    LEFT BREAST LUMPECTOMY WITH AXILLARY LYMPH NODE BIOPSY by Dr. Donell Beers  02/21/18   02/21/2018 Pathology Results   Diagnosis 02/21/18 1. Breast, lumpectomy, Left - INVASIVE DUCTAL CARCINOMA, GRADE I, 1.6 CM. - DUCTAL CARCINOMA IN SITU, INTERMEDIATE NUCLEAR GRADE. - ANTERIOR AND MEDIAL RESECTION MARGINS ARE POSITIVE FOR CARCINOMA. - NEGATIVE FOR LYMPHOVASCULAR OR PERINEURAL INVASION. - BACKGROUND BREAST TISSUE WITH FIBROCYSTIC CHANGE, INCLUDING SCLEROSING ADENOSIS. - BIOPSY SITE CHANGES. - SEE ONCOLOGY TABLE. 2. Lymph node, sentinel, biopsy, Left Axillary #1 - METASTATIC BREAST CARCINOMA TO A LYMPH NODE, 1.0 CM IN GREATEST DIMENSION, WITH EXTRANODAL EXTENSION (1/1). 3. Lymph node, sentinel, biopsy, Left Axillary #2 - LYMPH NODE, NEGATIVE FOR CARCINOMA (0/1).    02/21/2018 Miscellaneous   Mammaprint 02/21/18 Low Risk with 10-year risk of recurrnce at 10% -No potential signifcant chemotherapy benefit   03/20/2018 Pathology Results   RE-EXCISION OF BREAST LUMPECTOMY by Dr. Donell Beers  Diagnosis 03/20/18 1. Breast, excision, Left new anterior margin - FIBROCYSTIC CHANGES WITH ADENOSIS AND CALCIFICATIONS. - HEALING BIOPSY SITE. - THERE IS NO EVIDENCE OF MALIGNANCY. 2. Breast, excision, Left new medial margin - FIBROCYSTIC CHANGES WITH ADENOSIS AND CALCIFICATIONS. - HEALING BIOPSY SITE. - THERE IS NO  EVIDENCE OF  MALIGNANCY. Microscopic Comment 1. -2. The surgical resection margin(s) of the specimen were inked and microscopically evaluated. (JBK:kh 03-22-18)   04/09/2018 Cancer Staging   Staging form: Breast, AJCC 8th Edition - Pathologic: Stage IA (pT1c, pN1, cM0, G1, ER+, PR+, HER2-) - Signed by Lonie Peak, MD on 04/09/2018   04/22/2018 - 06/03/2018 Radiation Therapy   Radaiton with Dr. Basilio Cairo 04/22/18-06/03/18   05/2018 -  Anti-estrogen oral therapy   Letrozole 2.5mg  started 05/2018    Survivorship   Per Santiago Glad, NP    05/04/2022 Imaging    IMPRESSION: Cervical spondylosis, as described.   Nonspecific straightening of the expected cervical lordosis.   07/24/2022 Imaging    IMPRESSION: 1. Extensive osseous metastatic disease with pathologic fracture at the base of dens and C2 right lateral mass. Extraosseous tumor at C1 and C2 likely impinging on the right C2 and C3 nerve roots. 2. Degenerative cord impingement at C4-5 to C6-7. Biforaminal impingement at C5-6 and C6-7.   08/03/2022 PET scan    IMPRESSION: 1. Large volume osseous metastasis. 2. Low right cervical and probable right axillary nodal metastasis. 3. Subtle heterogeneous activity throughout the liver with suggestion of small liver lesions (likely new compared to chest CT of 07/16/2020). Findings are overall moderately suspicious for hepatic metastasis. Pre and post contrast abdominal MRI (preferred) or CT could confirm. 4. Right-sided pleural thickening and trace pleural fluid. Right base airspace disease is favored to represent chronic atelectasis. 5. Aortic atherosclerosis (ICD10-I70.0) and emphysema (ICD10-J43.9).     08/11/2022 Genetic Testing   Negative genetic testing on the CancerNext-Expanded+RNAinsight panel.  The report date is August 11, 2022.  The CancerNext-Expanded gene panel offered by [facility] and includes sequencing and rearrangement analysis for the following 77 genes: AIP,  ALK, APC*, ATM*, AXIN2, BAP1, BARD1, BLM, BMPR1A, BRCA1*, BRCA2*, BRIP1*, CDC73, CDH1*, CDK4, CDKN1B, CDKN2A, CHEK2*, CTNNA1, DICER1, FANCC, FH, FLCN, GALNT12, KIF1B, LZTR1, MAX, MEN1, MET, MLH1*, MSH2*, MSH3, MSH6*, MUTYH*, NBN, NF1*, NF2, NTHL1, PALB2*, PHOX2B, PMS2*, POT1, PRKAR1A, PTCH1, PTEN*, RAD51C*, RAD51D*, RB1, RECQL, RET, SDHA, SDHAF2, SDHB, SDHC, SDHD, SMAD4, SMARCA4, SMARCB1, SMARCE1, STK11, SUFU, TMEM127, TP53*, TSC1, TSC2, VHL and XRCC2 (sequencing and deletion/duplication); EGFR, EGLN1, HOXB13, KIT, MITF, PDGFRA, POLD1, and POLE (sequencing only); EPCAM and GREM1 (deletion/duplication only). DNA and RNA analyses performed for * genes.    Lobular carcinoma in situ (LCIS) of right breast  01/20/2020 Mammogram   Diagnostic Mammogram 01/20/20 IMPRESSION: 1.  Stable post lumpectomy changes of the left breast.   2. Suspicious microcalcifications over the right upper outer quadrant spanning 3.6 cm.   02/02/2020 Initial Biopsy   Diagnosis 02/02/20 Breast, right, needle core biopsy, upper outer quadrant, x clip - LOBULAR CARCINOMA IN SITU WITH PLEOMORPHIC FEATURES AND CALCIFICATIONS, INVOLVING ADENOSIS. SEE NOTE Diagnosis Note Immunohistochemical stain for E-cadherin is negative in the lesional cells, consistent with a lobular phenotype. Immunostains for p63, SMM 1 and calponin do not show evidence of invasive carcinoma.    02/04/2020 Initial Diagnosis   Lobular carcinoma in situ (LCIS) of right breast   03/18/2020 Surgery   RIGHT BREAST LUMPECTOMY WITH RADIOACTIVE SEED LOCALIZATION by Dr Alvira Monday    03/18/2020 Pathology Results   FINAL MICROSCOPIC DIAGNOSIS:   A. BREAST, RIGHT, LUMPECTOMY:  - Pleomorphic lobular carcinoma in situ with calcifications and  underlying complex sclerosing lesion, adenosis and fibroadenomatoid  change.  - Margins of resection are not involved (Closest margins: < 1 mm,  anterior, posterior, inferior and medial).  - Biopsy site.    COMMENT:  P63,  Calponin and SMM-1 demonstrate the presence of myoepithelium in the  select focus.    Metastatic breast cancer to bone and brain  05/04/2022 Imaging    IMPRESSION: Cervical spondylosis, as described.   Nonspecific straightening of the expected cervical lordosis.   07/24/2022 Imaging    IMPRESSION: 1. Extensive osseous metastatic disease with pathologic fracture at the base of dens and C2 right lateral mass. Extraosseous tumor at C1 and C2 likely impinging on the right C2 and C3 nerve roots. 2. Degenerative cord impingement at C4-5 to C6-7. Biforaminal impingement at C5-6 and C6-7.   08/02/2022 Initial Diagnosis   Cancer, metastatic to bone ([name])   08/03/2022 PET scan    IMPRESSION: 1. Large volume osseous metastasis. 2. Low right cervical and probable right axillary nodal metastasis. 3. Subtle heterogeneous activity throughout the liver with suggestion of small liver lesions (likely new compared to chest CT of 07/16/2020). Findings are overall moderately suspicious for hepatic metastasis. Pre and post contrast abdominal MRI (preferred) or CT could confirm. 4. Right-sided pleural thickening and trace pleural fluid. Right base airspace disease is favored to represent chronic atelectasis. 5. Aortic atherosclerosis (ICD10-I70.0) and emphysema (ICD10-J43.9).     10/26/2022 - 04/12/2023 Chemotherapy   Patient is on Treatment Plan : BREAST Paclitaxel D1,8,15 + Trastuzumab D1 + Pertuzumab D1 q21d x 8 cycles / Trastuzumab D1 + Pertuzumab D1 q21d x 4 cycles     05/03/2023 -  Chemotherapy   Patient is on Treatment Plan : BREAST MAINTENANCE Trastuzumab IV (6) or SQ (600) D1 q21d X 11 Cycles        CURRENT THERAPY:  -Zometa q3 months -starting 09/2022: Paclitaxel D1,8,15 + Trastuzumabd D1 + Pertuzumab D1 q21d  -Changed to taxol 2 weeks on/1 week off and q3 weeks Trastuzumab/Pertuzumab on 02/08/23  INTERVAL HISTORY Martha [name] returns for follow up as scheduled. Last seen by Dr. Mosetta Putt  05/31/23. She continues tucatinib, herceptin, perjeta.   ROS   Past Medical History:  Diagnosis Date   Anxiety    Cancer ([name]) 2022   right breast LCIS   Cancer ([name]) 2019   left breast   Depression    Dysrhythmia    SVT, s/p ablation ~ 2012 in at [facility]   Ectopic pregnancy    Family history of breast cancer    Family history of melanoma    History of radiation therapy 04/22/18-06/03/18   Left Breast, left SCV, axilla 50 Gy in 25 fractions, Left breast boost 10 Gy in 5 fractions.    Hypothyroidism    Personal history of radiation therapy    PONV (postoperative nausea and vomiting)    Thyroid disease      Past Surgical History:  Procedure Laterality Date   APPENDECTOMY     BILATERAL SALPINGECTOMY     BREAST BIOPSY Right 2014   fibroadenoma   BREAST BIOPSY Left 01/16/2018   BREAST BIOPSY Right 02/02/2020   LCIS   BREAST BIOPSY Right 08/04/2020   x2 LCIS   BREAST BIOPSY Right 08/13/2020   x2   BREAST EXCISIONAL BIOPSY Left 1990   benign   BREAST EXCISIONAL BIOPSY Right 02/02/2020   LCIS   BREAST LUMPECTOMY Left 01/22/2018   BREAST LUMPECTOMY WITH AXILLARY LYMPH NODE BIOPSY Left 02/21/2018   Procedure: LEFT BREAST LUMPECTOMY WITH AXILLARY LYMPH NODE BIOPSY;  Surgeon: Almond Lint, MD;  Location: [facility] OR;  Service: General;  Laterality: Left;   BREAST LUMPECTOMY WITH RADIOACTIVE SEED LOCALIZATION Right 03/18/2020   Procedure:  RIGHT BREAST LUMPECTOMY WITH RADIOACTIVE SEED LOCALIZATION;  Surgeon: Almond Lint, MD;  Location: [facility] OR;  Service: General;  Laterality: Right;   BREAST SURGERY Left 1993   cyst removed   ENDOMETRIAL ABLATION     EYE SURGERY     GLAUCOMA SURGERY Bilateral    IR IMAGING GUIDED PORT INSERTION  10/16/2022   POSTERIOR CERVICAL FUSION/FORAMINOTOMY N/A 08/17/2022   Procedure: Cervical One-Cervical Four POSTERIOR CERVICAL FUSION,  Cervical One LAMINECTOMY REDUCTION OF Cervical Two Fracture, Biopsy of Right Cerical Two Pars  Lesion;   Surgeon: Bedelia Person, MD;  Location: [facility] OR;  Service: Neurosurgery;  Laterality: N/A;   POSTERIOR CERVICAL FUSION/FORAMINOTOMY N/A 09/06/2022   Procedure: Posterior Cervical Fusion, Foraminotomy , Cervical Five-Six, Cervical Six-Seven; Extension of  fusion Cervical Four- Thoracic One;  Surgeon: Bedelia Person, MD;  Location: [facility] OR;  Service: Neurosurgery;  Laterality: N/A;   RADIOACTIVE SEED GUIDED EXCISIONAL BREAST BIOPSY Right 04/28/2021   Procedure: RADIOACTIVE SEED GUIDED EXCISIONAL RIGHT BREAST BIOPSY X2;  Surgeon: Almond Lint, MD;  Location: [facility];  Service: General;  Laterality: Right;   RE-EXCISION OF BREAST LUMPECTOMY Left 03/20/2018   Procedure: RE-EXCISION OF BREAST LUMPECTOMY;  Surgeon: Almond Lint, MD;  Location:  SURGERY CENTER;  Service: General;  Laterality: Left;     Outpatient Encounter Medications as of 06/21/2023  Medication Sig   acetaminophen (TYLENOL) 500 MG tablet Take 2 tablets (1,000 mg total) by mouth every 8 (eight) hours.   capecitabine (XELODA) 500 MG tablet Take 2 tablets (1,000 mg total) by mouth 2 (two) times daily after a meal. TAKTAKE 2 TABLETS (1,000 MG) TWICE DAILY AFTER A MEAL FOR 7 DAYS ON, THEN 7 DAYS OFF. REPEAT EVERY 14 DAYS   citalopram (CELEXA) 40 MG tablet Take 1 tablet (40 mg total) by mouth daily.   diphenoxylate-atropine (LOMOTIL) 2.5-0.025 MG tablet Take 1 tablet by mouth 4 (four) times daily as needed for diarrhea or loose stools.   levothyroxine (SYNTHROID) 75 MCG tablet Take 1 tablet (75 mcg total) by mouth daily before breakfast.   lidocaine (XYLOCAINE) 2 % solution Patient: Mix 1part 2% viscous lidocaine, 1part H20. Swallow 10mL of diluted mixture, before meals and at bedtime, up to QID   Loperamide HCl (IMODIUM PO) Take by mouth as needed.   LORazepam (ATIVAN) 0.5 MG tablet Take 1 tablet (0.5 mg total) by mouth every 6 (six) hours as needed for anxiety (agitation).   Mouthwashes (MOUTH RINSE)  LIQD solution 15 mLs by Mouth Rinse route as needed (for oral care).   nicotine (NICODERM CQ - DOSED IN MG/24 HR) 7 mg/24hr patch Place 1 patch (7 mg total) onto the skin daily.   ondansetron (ZOFRAN) 8 MG tablet Take 1 tablet (8 mg total) by mouth every 8 (eight) hours as needed for nausea or vomiting.   OVER THE COUNTER MEDICATION Take 1 tablet by mouth daily. Protandim Supplement   oxyCODONE (OXY IR/ROXICODONE) 5 MG immediate release tablet Take 0.5-1 tablets (2.5-5 mg total) by mouth every 4 (four) hours as needed for moderate pain or severe pain (dyspnea).   pregabalin (LYRICA) 25 MG capsule Take 1 capsule (25 mg total) by mouth 2 (two) times daily.   senna-docusate (SENOKOT-S) 8.6-50 MG tablet Take 1 tablet by mouth 2 (two) times daily.   tucatinib (TUKYSA) 150 MG tablet Take 2 tablets (300 mg total) by mouth 2 (two) times daily. Take as instructed per MD.   No facility-administered encounter medications on file as of  06/21/2023.     There were no vitals filed for this visit. There is no height or weight on file to calculate BMI.   PHYSICAL EXAM GENERAL:alert, no distress and comfortable SKIN: no rash  EYES: sclera clear NECK: without mass LYMPH:  no palpable cervical or supraclavicular lymphadenopathy  LUNGS: clear with normal breathing effort HEART: regular rate & rhythm, no lower extremity edema ABDOMEN: abdomen soft, non-tender and normal bowel sounds NEURO: alert & oriented x 3 with fluent speech, no focal motor/sensory deficits Breast exam:  PAC without erythema    CBC    Component Value Date/Time   WBC 5.4 05/31/2023 0820   WBC 10.3 10/19/2022 0500   RBC 4.56 05/31/2023 0820   HGB 14.7 05/31/2023 0820   HCT 43.4 05/31/2023 0820   PLT 239 05/31/2023 0820   MCV 95.2 05/31/2023 0820   MCH 32.2 05/31/2023 0820   MCHC 33.9 05/31/2023 0820   RDW 17.2 (H) 05/31/2023 0820   LYMPHSABS 0.8 05/31/2023 0820   MONOABS 0.4 05/31/2023 0820   EOSABS 0.0 05/31/2023 0820    BASOSABS 0.0 05/31/2023 0820     CMP     Component Value Date/Time   NA 139 05/31/2023 0820   K 3.6 05/31/2023 0820   CL 103 05/31/2023 0820   CO2 30 05/31/2023 0820   GLUCOSE 127 (H) 05/31/2023 0820   BUN 12 05/31/2023 0820   CREATININE 0.51 05/31/2023 0820   CALCIUM 9.8 05/31/2023 0820   PROT 6.9 05/31/2023 0820   ALBUMIN 4.5 05/31/2023 0820   AST 22 05/31/2023 0820   ALT 14 05/31/2023 0820   ALKPHOS 103 05/31/2023 0820   BILITOT 0.8 05/31/2023 0820   GFRNONAA >60 05/31/2023 0820   GFRAA >60 05/21/2020 1350     ASSESSMENT & PLAN::Martha [name] is a 62 y.o. female with    Metastatic breast cancer to bone and brain, ER/PR - HER2 + -Diagnosed in 06/2022 -PET  08/03/2022 showed diffuse bone mets  -s/p cervical laminectomy and fusion by Dr. Maisie Fus on 08/17/2022, biopsy confirmed metastatic breast cancer, ER/PR negative, HER2 positive. -due to cervical cord compression with myelopathy, she underwent a second cervical spine surgery on 09/06/2022, and completed inpatient rehabitation. -S/p brain, cervical and lumbar spine radiation in 09/2022 -She started first line systemic chemotherapy with weekly Taxol and trastuzumab/Perjeta on 10/25/22. She has tolerated well so far and her pain has improved overall -PET 01/01/2023 showed complete metabolic response, no hypermetabolic uptake in diffuse bone lesions, no other new lesions.  She is clinically also doing much better.  Given her excellent response to chemo, she changed to Taxol 2 weeks on/ 1 week off (and continues q3 weeks kanjinti/perjeta) as of 02/08/23 -Martha [name] appears stable. Tolerating chemo with diarrhea which is well managed with supportive care at home. She is able to recover and function, PS is fair -Unfortunately recent brain MRI shows diffuse, innumerable new metastatic disease. She met with Dr. Basilio Cairo and is open to whole brain radiation but holding for now -PET 04/13/23 was stable -Changed to Xeloda/Tucatinib and  Trastuzumab to penetrate BBB better starting 03/2023 and zontinues zometa q3 months     Malignant neoplasm of lower-inner quadrant of left breast in female, estrogen receptor positive She was diagnosed in 12/2017. She is s/p left breast lumpectomy and adjuvant radiation.  -She started anti-estrogen therapy with letrozole on 05/2018. Tolerating well with no issues but is no longer taking -lost f/u after visit in 04/2020 until her recurrence in 07/2022  Cancer related pain -2/2 bone metastasis, improved after RT -Currently managed on Cymbalta, lyrica, extra strength tylenol, and rare oxycodone  -f/u with palliative care clinic NP Nikki       PLAN:  No orders of the defined types were placed in this encounter.     All questions were answered. The patient knows to call the clinic with any problems, questions or concerns. No barriers to learning were detected. I spent *** counseling the patient face to face. The total time spent in the appointment was *** and more than 50% was on counseling, review of test results, and coordination of care.   Santiago Glad, NP-C @DATE @

## 2023-06-20 NOTE — Progress Notes (Unsigned)
Palliative Medicine [facility]  Telephone:([phone] Fax:([phone]) [phone]   Name: Brittan Pavon Date: 06/20/2023 MRN: [id number]  DOB: [birth year]  Patient Care Team: Ardith Dark, MD as PCP - General (Family Medicine) Almond Lint, MD as Consulting Physician (General Surgery) Malachy Mood, MD as Consulting Physician (Hematology) Lonie Peak, MD as Attending Physician (Radiation Oncology) Pollyann Samples, NP as Nurse Practitioner (Nurse Practitioner)    INTERVAL HISTORY: Jenille Kahane is a 62 y.o. female with oncologic medical history including ER+ breast cancer (01/2018), right breast cancer (04/2021), now with metastatic disease progression involving brain and liver, pathological fractures with osseous mets s/p brain radiation. .  Palliative ask to see for symptom management and goals of care.   SOCIAL HISTORY:    Mrs. Butka reports that she has been smoking cigarettes. She has a 20 pack-year smoking history. She has never used smokeless tobacco. She reports that she does not currently use alcohol after a past usage of about 28.0 standard drinks of alcohol per week. She reports that she does not use drugs.  ADVANCE DIRECTIVES:  Has documents on file  CODE STATUS: DNR  PAST MEDICAL HISTORY: Past Medical History:  Diagnosis Date  . Anxiety   . Cancer [facility]) 2022   right breast LCIS  . Cancer ([name]) 2019   left breast  . Depression   . Dysrhythmia    SVT, s/p ablation ~ 2012 in at [facility]  . Ectopic pregnancy   . Family history of breast cancer   . Family history of melanoma   . History of radiation therapy 04/22/18-06/03/18   Left Breast, left SCV, axilla 50 Gy in 25 fractions, Left breast boost 10 Gy in 5 fractions.   . Hypothyroidism   . Personal history of radiation therapy   . PONV (postoperative nausea and vomiting)   . Thyroid disease     ALLERGIES:  is allergic to morphine and codeine.  MEDICATIONS:   Current Outpatient Medications  Medication Sig Dispense Refill  . acetaminophen (TYLENOL) 500 MG tablet Take 2 tablets (1,000 mg total) by mouth every 8 (eight) hours. 30 tablet 0  . capecitabine (XELODA) 500 MG tablet Take 2 tablets (1,000 mg total) by mouth 2 (two) times daily after a meal. TAKTAKE 2 TABLETS (1,000 MG) TWICE DAILY AFTER A MEAL FOR 7 DAYS ON, THEN 7 DAYS OFF. REPEAT EVERY 14 DAYS 56 tablet 0  . citalopram (CELEXA) 40 MG tablet Take 1 tablet (40 mg total) by mouth daily. 30 tablet 3  . diphenoxylate-atropine (LOMOTIL) 2.5-0.025 MG tablet Take 1 tablet by mouth 4 (four) times daily as needed for diarrhea or loose stools. 90 tablet 0  . levothyroxine (SYNTHROID) 75 MCG tablet Take 1 tablet (75 mcg total) by mouth daily before breakfast. 30 tablet 0  . lidocaine (XYLOCAINE) 2 % solution Patient: Mix 1part 2% viscous lidocaine, 1part H20. Swallow 10mL of diluted mixture, before meals and at bedtime, up to QID 100 mL 3  . Loperamide HCl (IMODIUM PO) Take by mouth as needed.    Marland Kitchen LORazepam (ATIVAN) 0.5 MG tablet Take 1 tablet (0.5 mg total) by mouth every 6 (six) hours as needed for anxiety (agitation). 30 tablet 0  . Mouthwashes (MOUTH RINSE) LIQD solution 15 mLs by Mouth Rinse route as needed (for oral care).  0  . nicotine (NICODERM CQ - DOSED IN MG/24 HR) 7 mg/24hr patch Place 1 patch (7 mg total) onto the skin daily. 28  patch 0  . ondansetron (ZOFRAN) 8 MG tablet Take 1 tablet (8 mg total) by mouth every 8 (eight) hours as needed for nausea or vomiting. 20 tablet 1  . OVER THE COUNTER MEDICATION Take 1 tablet by mouth daily. Protandim Supplement    . oxyCODONE (OXY IR/ROXICODONE) 5 MG immediate release tablet Take 0.5-1 tablets (2.5-5 mg total) by mouth every 4 (four) hours as needed for moderate pain or severe pain (dyspnea). 30 tablet 0  . pregabalin (LYRICA) 25 MG capsule Take 1 capsule (25 mg total) by mouth 2 (two) times daily. 60 capsule 2  . senna-docusate (SENOKOT-S)  8.6-50 MG tablet Take 1 tablet by mouth 2 (two) times daily. 60 tablet 0  . tucatinib (TUKYSA) 150 MG tablet Take 2 tablets (300 mg total) by mouth 2 (two) times daily. Take as instructed per MD. 120 tablet 2   No current facility-administered medications for this visit.    VITAL SIGNS: There were no vitals taken for this visit. There were no vitals filed for this visit.  Estimated body mass index is 13.9 kg/m as calculated from the following:   Height as of 06/05/23: 5\' 2"  (1.575 m).   Weight as of 06/05/23: 76 lb (34.5 kg).   PERFORMANCE STATUS (ECOG) : 3 - Symptomatic, >50% confined to bed   Physical Exam General: NAD, thin   Cardiovascular: regular rate and rhythm Pulmonary: normal breathing pattern Abdomen: soft, nontender, + bowel sounds Extremities: no edema, no joint deformities Skin: no rashes, muscle wasting  Neurological: alert, oriented x 4  IMPRESSION: I saw Mrs. Damon during her infusion. No acute distress. Her husband is present. Denies nausea, vomiting. Is taking things one day at a time. Occasional diarrhea which is controlled with lomotil. Reports recent episode of constipation. This has since resolved.   Decreased appetite/weight loss Appetite is stable. Current weight stable at 74lbs. She is enjoying eating foods that she enjoys. Drinking daily protein shakes.    Pain Mrs. Manderfield shares pain is minimal. Occasional aches and pains. Lyrica as prescribed. Tylenol as needed. Describes pain in sacral and back area. Some stiffness and tenderness to neck area.   No symptom management needs at this time. Will continue to support as needed.   PLAN: Continue intake of Ensure and soft, high-calorie foods. Continue Lyrica 25 mg twice daily. Tylenol as needed for mild aches and pain.  Protective skin barrier to sacral area. Ongoing goals of care discussions and symptom management Palliative will plan to see patient back in 2-4 weeks in collaboration to other oncology  appointments.  Patient and husband knows to contact office sooner if needed.   Patient expressed understanding and was in agreement with this plan. She also understands that She can call the clinic at any time with any questions, concerns, or complaints.      Any controlled substances utilized were prescribed in the context of palliative care. PDMP has been reviewed.   Visit consisted of counseling and education dealing with the complex and emotionally intense issues of symptom management and palliative care in the setting of serious and potentially life-threatening illness.Greater than 50%  of this time was spent counseling and coordinating care related to the above assessment and plan.  Willette Alma, AGPCNP-BC  Palliative Medicine Team/[facility]

## 2023-06-21 ENCOUNTER — Encounter: Payer: Self-pay | Admitting: Nurse Practitioner

## 2023-06-21 ENCOUNTER — Inpatient Hospital Stay (HOSPITAL_BASED_OUTPATIENT_CLINIC_OR_DEPARTMENT_OTHER): Payer: BC Managed Care – PPO | Admitting: Nurse Practitioner

## 2023-06-21 ENCOUNTER — Inpatient Hospital Stay: Payer: BC Managed Care – PPO

## 2023-06-21 ENCOUNTER — Encounter: Payer: BC Managed Care – PPO | Admitting: Dietician

## 2023-06-21 DIAGNOSIS — Z171 Estrogen receptor negative status [ER-]: Secondary | ICD-10-CM | POA: Diagnosis not present

## 2023-06-21 DIAGNOSIS — C7931 Secondary malignant neoplasm of brain: Secondary | ICD-10-CM | POA: Diagnosis not present

## 2023-06-21 DIAGNOSIS — Z515 Encounter for palliative care: Secondary | ICD-10-CM

## 2023-06-21 DIAGNOSIS — C50312 Malignant neoplasm of lower-inner quadrant of left female breast: Secondary | ICD-10-CM | POA: Diagnosis not present

## 2023-06-21 DIAGNOSIS — G893 Neoplasm related pain (acute) (chronic): Secondary | ICD-10-CM

## 2023-06-21 DIAGNOSIS — Z170 Estrogen receptor positive status [ER+]: Secondary | ICD-10-CM

## 2023-06-21 DIAGNOSIS — C7951 Secondary malignant neoplasm of bone: Secondary | ICD-10-CM | POA: Diagnosis not present

## 2023-06-21 DIAGNOSIS — Z79899 Other long term (current) drug therapy: Secondary | ICD-10-CM | POA: Diagnosis not present

## 2023-06-21 DIAGNOSIS — Z5112 Encounter for antineoplastic immunotherapy: Secondary | ICD-10-CM | POA: Diagnosis not present

## 2023-06-21 DIAGNOSIS — Z95828 Presence of other vascular implants and grafts: Secondary | ICD-10-CM

## 2023-06-21 DIAGNOSIS — R530 Neoplastic (malignant) related fatigue: Secondary | ICD-10-CM | POA: Diagnosis not present

## 2023-06-21 DIAGNOSIS — C787 Secondary malignant neoplasm of liver and intrahepatic bile duct: Secondary | ICD-10-CM | POA: Diagnosis not present

## 2023-06-21 LAB — CBC WITH DIFFERENTIAL (CANCER CENTER ONLY)
Abs Immature Granulocytes: 0.02 10*3/uL (ref 0.00–0.07)
Basophils Absolute: 0 10*3/uL (ref 0.0–0.1)
Basophils Relative: 1 %
Eosinophils Absolute: 0.1 10*3/uL (ref 0.0–0.5)
Eosinophils Relative: 1 %
HCT: 43.8 % (ref 36.0–46.0)
Hemoglobin: 14.9 g/dL (ref 12.0–15.0)
Immature Granulocytes: 0 %
Lymphocytes Relative: 12 %
Lymphs Abs: 0.7 10*3/uL (ref 0.7–4.0)
MCH: 32.6 pg (ref 26.0–34.0)
MCHC: 34 g/dL (ref 30.0–36.0)
MCV: 95.8 fL (ref 80.0–100.0)
Monocytes Absolute: 0.5 10*3/uL (ref 0.1–1.0)
Monocytes Relative: 9 %
Neutro Abs: 4.1 10*3/uL (ref 1.7–7.7)
Neutrophils Relative %: 77 %
Platelet Count: 206 10*3/uL (ref 150–400)
RBC: 4.57 MIL/uL (ref 3.87–5.11)
RDW: 18.2 % — ABNORMAL HIGH (ref 11.5–15.5)
WBC Count: 5.4 10*3/uL (ref 4.0–10.5)
nRBC: 0 % (ref 0.0–0.2)

## 2023-06-21 LAB — CMP (CANCER CENTER ONLY)
ALT: 17 U/L (ref 0–44)
AST: 21 U/L (ref 15–41)
Albumin: 4.3 g/dL (ref 3.5–5.0)
Alkaline Phosphatase: 92 U/L (ref 38–126)
Anion gap: 5 (ref 5–15)
BUN: 16 mg/dL (ref 8–23)
CO2: 30 mmol/L (ref 22–32)
Calcium: 9.6 mg/dL (ref 8.9–10.3)
Chloride: 103 mmol/L (ref 98–111)
Creatinine: 0.46 mg/dL (ref 0.44–1.00)
GFR, Estimated: >60 mL/min (ref >60)
Glucose, Bld: 112 mg/dL — ABNORMAL HIGH (ref 70–99)
Potassium: 4 mmol/L (ref 3.5–5.1)
Sodium: 138 mmol/L (ref 135–145)
Total Bilirubin: 0.6 mg/dL (ref 0.3–1.2)
Total Protein: 6.6 g/dL (ref 6.5–8.1)

## 2023-06-21 MED ORDER — SODIUM CHLORIDE 0.9 % IV SOLN
6.0000 mg/kg | Freq: Once | INTRAVENOUS | Status: AC
  Administered 2023-06-21: 210 mg via INTRAVENOUS
  Filled 2023-06-21: qty 10

## 2023-06-21 MED ORDER — SODIUM CHLORIDE 0.9% FLUSH
10.0000 mL | INTRAVENOUS | Status: DC | PRN
  Administered 2023-06-21: 10 mL

## 2023-06-21 MED ORDER — ACETAMINOPHEN 325 MG PO TABS
650.0000 mg | ORAL_TABLET | Freq: Once | ORAL | Status: AC
  Administered 2023-06-21: 650 mg via ORAL
  Filled 2023-06-21: qty 2

## 2023-06-21 MED ORDER — SODIUM CHLORIDE 0.9% FLUSH
10.0000 mL | Freq: Once | INTRAVENOUS | Status: AC
  Administered 2023-06-21: 10 mL

## 2023-06-21 MED ORDER — HEPARIN SOD (PORK) LOCK FLUSH 100 UNIT/ML IV SOLN
500.0000 [IU] | Freq: Once | INTRAVENOUS | Status: AC | PRN
  Administered 2023-06-21: 500 [IU]

## 2023-06-21 MED ORDER — SODIUM CHLORIDE 0.9 % IV SOLN: Freq: Once | INTRAVENOUS | Status: AC

## 2023-06-21 MED ORDER — DIPHENHYDRAMINE HCL 25 MG PO CAPS
50.0000 mg | ORAL_CAPSULE | Freq: Once | ORAL | Status: AC
  Administered 2023-06-21: 50 mg via ORAL
  Filled 2023-06-21: qty 2

## 2023-06-21 MED ORDER — ZOLEDRONIC ACID 4 MG/100ML IV SOLN
4.0000 mg | Freq: Once | INTRAVENOUS | Status: AC
  Administered 2023-06-21: 4 mg via INTRAVENOUS
  Filled 2023-06-21: qty 100

## 2023-06-22 ENCOUNTER — Other Ambulatory Visit: Payer: Self-pay | Admitting: Hematology

## 2023-06-22 DIAGNOSIS — C7951 Secondary malignant neoplasm of bone: Secondary | ICD-10-CM

## 2023-06-22 NOTE — Telephone Encounter (Signed)
Confirmed with Dr. Mosetta Putt that the pt's dose is 1,000mg  BID for 7 days on and then 7 days OFF repeat Q14 days

## 2023-07-05 ENCOUNTER — Encounter (HOSPITAL_COMMUNITY)
Admission: RE | Admit: 2023-07-05 | Discharge: 2023-07-05 | Disposition: A | Discharge status: Home / Self Care | Payer: BC Managed Care – PPO | Source: Ambulatory Visit | Attending: Hematology | Admitting: Hematology

## 2023-07-05 DIAGNOSIS — C7951 Secondary malignant neoplasm of bone: Secondary | ICD-10-CM | POA: Insufficient documentation

## 2023-07-05 DIAGNOSIS — C50919 Malignant neoplasm of unspecified site of unspecified female breast: Secondary | ICD-10-CM | POA: Diagnosis not present

## 2023-07-05 LAB — GLUCOSE, CAPILLARY: Glucose-Capillary: 100 mg/dL — ABNORMAL HIGH (ref 70–99)

## 2023-07-05 MED ORDER — FLUDEOXYGLUCOSE F - 18 (FDG) INJECTION
5.0000 | Freq: Once | INTRAVENOUS | Status: AC
  Administered 2023-07-05: 5 via INTRAVENOUS

## 2023-07-07 ENCOUNTER — Other Ambulatory Visit: Payer: Self-pay | Admitting: Hematology

## 2023-07-07 DIAGNOSIS — C7951 Secondary malignant neoplasm of bone: Secondary | ICD-10-CM

## 2023-07-09 ENCOUNTER — Encounter: Payer: Self-pay | Admitting: Hematology

## 2023-07-09 MED ORDER — CAPECITABINE 500 MG PO TABS: ORAL_TABLET | ORAL | 0 refills | Status: DC

## 2023-07-12 ENCOUNTER — Inpatient Hospital Stay (HOSPITAL_BASED_OUTPATIENT_CLINIC_OR_DEPARTMENT_OTHER): Payer: BC Managed Care – PPO | Admitting: Hematology

## 2023-07-12 ENCOUNTER — Inpatient Hospital Stay: Payer: BC Managed Care – PPO

## 2023-07-12 ENCOUNTER — Inpatient Hospital Stay: Payer: BC Managed Care – PPO | Admitting: Dietician

## 2023-07-12 ENCOUNTER — Other Ambulatory Visit: Payer: Self-pay | Admitting: Nurse Practitioner

## 2023-07-12 ENCOUNTER — Inpatient Hospital Stay: Payer: BC Managed Care – PPO | Attending: Genetic Counselor

## 2023-07-12 ENCOUNTER — Other Ambulatory Visit: Payer: Self-pay

## 2023-07-12 VITALS — BP 109/69 | HR 93 | Temp 97.7°F | Resp 16 | Ht 62.0 in | Wt 74.0 lb

## 2023-07-12 DIAGNOSIS — C50312 Malignant neoplasm of lower-inner quadrant of left female breast: Secondary | ICD-10-CM | POA: Insufficient documentation

## 2023-07-12 DIAGNOSIS — Z515 Encounter for palliative care: Secondary | ICD-10-CM

## 2023-07-12 DIAGNOSIS — Z5112 Encounter for antineoplastic immunotherapy: Secondary | ICD-10-CM | POA: Diagnosis not present

## 2023-07-12 DIAGNOSIS — C7951 Secondary malignant neoplasm of bone: Secondary | ICD-10-CM | POA: Insufficient documentation

## 2023-07-12 DIAGNOSIS — Z23 Encounter for immunization: Secondary | ICD-10-CM | POA: Insufficient documentation

## 2023-07-12 DIAGNOSIS — Z95828 Presence of other vascular implants and grafts: Secondary | ICD-10-CM

## 2023-07-12 DIAGNOSIS — F32A Depression, unspecified: Secondary | ICD-10-CM

## 2023-07-12 DIAGNOSIS — Z171 Estrogen receptor negative status [ER-]: Secondary | ICD-10-CM | POA: Diagnosis not present

## 2023-07-12 DIAGNOSIS — C7931 Secondary malignant neoplasm of brain: Secondary | ICD-10-CM | POA: Diagnosis not present

## 2023-07-12 DIAGNOSIS — Z79899 Other long term (current) drug therapy: Secondary | ICD-10-CM | POA: Diagnosis not present

## 2023-07-12 DIAGNOSIS — M792 Neuralgia and neuritis, unspecified: Secondary | ICD-10-CM

## 2023-07-12 LAB — CBC WITH DIFFERENTIAL (CANCER CENTER ONLY)
Abs Immature Granulocytes: 0.02 10*3/uL (ref 0.00–0.07)
Basophils Absolute: 0 10*3/uL (ref 0.0–0.1)
Basophils Relative: 0 %
Eosinophils Absolute: 0.1 10*3/uL (ref 0.0–0.5)
Eosinophils Relative: 1 %
HCT: 44 % (ref 36.0–46.0)
Hemoglobin: 15.1 g/dL — ABNORMAL HIGH (ref 12.0–15.0)
Immature Granulocytes: 0 %
Lymphocytes Relative: 16 %
Lymphs Abs: 0.8 10*3/uL (ref 0.7–4.0)
MCH: 33.6 pg (ref 26.0–34.0)
MCHC: 34.3 g/dL (ref 30.0–36.0)
MCV: 97.8 fL (ref 80.0–100.0)
Monocytes Absolute: 0.3 10*3/uL (ref 0.1–1.0)
Monocytes Relative: 7 %
Neutro Abs: 3.6 10*3/uL (ref 1.7–7.7)
Neutrophils Relative %: 76 %
Platelet Count: 232 10*3/uL (ref 150–400)
RBC: 4.5 MIL/uL (ref 3.87–5.11)
RDW: 17 % — ABNORMAL HIGH (ref 11.5–15.5)
WBC Count: 4.8 10*3/uL (ref 4.0–10.5)
nRBC: 0 % (ref 0.0–0.2)

## 2023-07-12 LAB — CMP (CANCER CENTER ONLY)
ALT: 19 U/L (ref 0–44)
AST: 23 U/L (ref 15–41)
Albumin: 4.1 g/dL (ref 3.5–5.0)
Alkaline Phosphatase: 114 U/L (ref 38–126)
Anion gap: 7 (ref 5–15)
BUN: 13 mg/dL (ref 8–23)
CO2: 30 mmol/L (ref 22–32)
Calcium: 9.5 mg/dL (ref 8.9–10.3)
Chloride: 102 mmol/L (ref 98–111)
Creatinine: 0.4 mg/dL — ABNORMAL LOW (ref 0.44–1.00)
GFR, Estimated: >60 mL/min (ref >60)
Glucose, Bld: 158 mg/dL — ABNORMAL HIGH (ref 70–99)
Potassium: 3.7 mmol/L (ref 3.5–5.1)
Sodium: 139 mmol/L (ref 135–145)
Total Bilirubin: 0.4 mg/dL (ref <1.2)
Total Protein: 6.7 g/dL (ref 6.5–8.1)

## 2023-07-12 MED ORDER — INFLUENZA VIRUS VACC SPLIT PF (FLUZONE) 0.5 ML IM SUSY
0.5000 mL | PREFILLED_SYRINGE | Freq: Once | INTRAMUSCULAR | Status: AC
  Administered 2023-07-12: 0.5 mL via INTRAMUSCULAR
  Filled 2023-07-12: qty 0.5

## 2023-07-12 MED ORDER — ACETAMINOPHEN 325 MG PO TABS
650.0000 mg | ORAL_TABLET | Freq: Once | ORAL | Status: AC
  Administered 2023-07-12: 650 mg via ORAL
  Filled 2023-07-12: qty 2

## 2023-07-12 MED ORDER — DIPHENHYDRAMINE HCL 25 MG PO CAPS
50.0000 mg | ORAL_CAPSULE | Freq: Once | ORAL | Status: AC
  Administered 2023-07-12: 50 mg via ORAL
  Filled 2023-07-12: qty 2

## 2023-07-12 MED ORDER — TUCATINIB 150 MG PO TABS: 300.0000 mg | ORAL_TABLET | Freq: Two times a day (BID) | ORAL | 2 refills | Status: DC

## 2023-07-12 MED ORDER — SODIUM CHLORIDE 0.9 % IV SOLN: Freq: Once | INTRAVENOUS | Status: AC

## 2023-07-12 MED ORDER — SODIUM CHLORIDE 0.9% FLUSH
10.0000 mL | Freq: Once | INTRAVENOUS | Status: AC | PRN
  Administered 2023-07-12: 10 mL

## 2023-07-12 MED ORDER — TRASTUZUMAB-ANNS CHEMO 150 MG IV SOLR
6.0000 mg/kg | Freq: Once | INTRAVENOUS | Status: AC
  Administered 2023-07-12: 210 mg via INTRAVENOUS
  Filled 2023-07-12: qty 10

## 2023-07-12 MED ORDER — HEPARIN SOD (PORK) LOCK FLUSH 100 UNIT/ML IV SOLN
500.0000 [IU] | Freq: Once | INTRAVENOUS | Status: AC | PRN
  Administered 2023-07-12: 500 [IU]

## 2023-07-12 MED ORDER — SODIUM CHLORIDE 0.9% FLUSH
10.0000 mL | INTRAVENOUS | Status: DC | PRN
  Administered 2023-07-12: 10 mL

## 2023-07-12 NOTE — Assessment & Plan Note (Signed)
-  Diagnosed in 06/2022, ER-/PR-/HER2+ --PET scan from 08/03/2022 showed diffuse bone mets  -she underwent cervical laminectomy and fusion by Dr. Maisie Fus on 08/17/2022, biopsy confirmed metastatic breast cancer, ER/PR negative, HER2 positive. -due to cervical cord compression with myelopathy, she underwent a second cervical spine surgery on September 06, 2022, and completed inpatient rehabitation. -She has completed brain, cervical and lumbar spine radiation in Feb 2024 -She was recently hospitalized for altered mental status and confusion, probably secondary to medication -She started first line systemic chemotherapy with weekly Taxol and trastuzumab/Perjeta on 10/25/22. She has tolerated well so far and her pain has improved overall -PET scan from Jan 01, 2023 showed complete metabolic response, no hypermetabolic uptake in diffuse bone lesions, no other new lesions.  She is clinically also doing much better.  Given her excellent response to chemo, we will change her Taxol to 2 weeks on and 1 week off -She Unfortunately developed more more brain metastasis on recent brain MRI. -I personally reviewed her restaging PET scan from April 13, 2023, which showed stable disease, no new lesions. -Due to her new brain metastasis, I changeD her email to Tucatinib, capecitabine and trastuzumab, for better brain penetration. She started in late August 2024, tolerating well so far.  Her case was reviewed in CNS tumor board, Dr. Basilio Cairo plan to hold on whole brain radiation since she is starting new Her2 antibody and monitor her brain mets closely.  -she is also on Zometa every 3 months

## 2023-07-12 NOTE — Patient Instructions (Signed)
[facility] - A DEPT OF [facility]  Discharge Instructions: Thank you for choosing [facility] to provide your oncology and hematology care.   If you have a lab appointment with the Cancer Center, please go directly to the Cancer Center and check in at the registration area.   Wear comfortable clothing and clothing appropriate for easy access to any Portacath or PICC line.   We strive to give you quality time with your provider. You may need to reschedule your appointment if you arrive late (15 or more minutes).  Arriving late affects you and other patients whose appointments are after yours.  Also, if you miss three or more appointments without notifying the office, you may be dismissed from the clinic at the provider's discretion.      For prescription refill requests, have your pharmacy contact our office and allow 72 hours for refills to be completed.    Today you received the following chemotherapy and/or immunotherapy agents: Kanjinti      To help prevent nausea and vomiting after your treatment, we encourage you to take your nausea medication as directed.  BELOW ARE SYMPTOMS THAT SHOULD BE REPORTED IMMEDIATELY: *FEVER GREATER THAN 100.4 F (38 C) OR HIGHER *CHILLS OR SWEATING *NAUSEA AND VOMITING THAT IS NOT CONTROLLED WITH YOUR NAUSEA MEDICATION *UNUSUAL SHORTNESS OF BREATH *UNUSUAL BRUISING OR BLEEDING *URINARY PROBLEMS (pain or burning when urinating, or frequent urination) *BOWEL PROBLEMS (unusual diarrhea, constipation, pain near the anus) TENDERNESS IN MOUTH AND THROAT WITH OR WITHOUT PRESENCE OF ULCERS (sore throat, sores in mouth, or a toothache) UNUSUAL RASH, SWELLING OR PAIN  UNUSUAL VAGINAL DISCHARGE OR ITCHING   Items with * indicate a potential emergency and should be followed up as soon as possible or go to the Emergency Department if any problems should occur.  Please show the CHEMOTHERAPY ALERT CARD or IMMUNOTHERAPY  ALERT CARD at check-in to the Emergency Department and triage nurse.  Should you have questions after your visit or need to cancel or reschedule your appointment, please contact [place] [facility] [facility] - A DEPT OF [facility]  Dept: [phone]  and follow the prompts.  Office hours are 8:00 a.m. to 4:30 p.m. Monday - Friday. Please note that voicemails left after 4:00 p.m. may not be returned until the following business day.  We are closed weekends and major holidays. You have access to a nurse at all times for urgent questions. Please call the main number to the clinic Dept: [phone] and follow the prompts.   For any non-urgent questions, you may also contact your provider using MyChart. We now offer e-Visits for anyone 29 and older to request care online for non-urgent symptoms. For details visit [email or web address].   Also download the MyChart app! Go to the app store, search "MyChart", open the app, select [place], and log in with your MyChart username and password.

## 2023-07-12 NOTE — Progress Notes (Unsigned)
Nutrition Follow-up:  Patient with metastatic breast cancer. She is receiving paclitaxel/perjeta/kanjinti q21d.       Medications: reviewed   Labs: reviewed   Anthropometrics: Wt 74 lb today - trending down    NUTRITION DIAGNOSIS: Inadequate oral intake continues      INTERVENTION: ***    MONITORING, EVALUATION, GOAL: wt trends, intake   NEXT VISIT: ***

## 2023-07-12 NOTE — Progress Notes (Signed)
[facility]   Telephone:(336) [phone] Fax:(336) [phone]   Clinic Follow up Note   Patient Care Team: Ardith Dark, MD as PCP - General (Family Medicine) Almond Lint, MD as Consulting Physician (General Surgery) Malachy Mood, MD as Consulting Physician (Hematology) Lonie Peak, MD as Attending Physician (Radiation Oncology) Pollyann Samples, NP as Nurse Practitioner (Nurse Practitioner)  Date of Service:  07/12/2023  CHIEF COMPLAINT: f/u of metastatic breast cancer  CURRENT THERAPY:  Second line chemotherapy with Xeloda, Tucatinib and trastuzumab  Oncology History   Metastatic breast cancer to bone and brain -Diagnosed in 06/2022, ER-/PR-/HER2+ --PET scan from 08/03/2022 showed diffuse bone mets  -she underwent cervical laminectomy and fusion by Dr. Maisie Fus on 08/17/2022, biopsy confirmed metastatic breast cancer, ER/PR negative, HER2 positive. -due to cervical cord compression with myelopathy, she underwent a second cervical spine surgery on September 06, 2022, and completed inpatient rehabitation. -She has completed brain, cervical and lumbar spine radiation in Feb 2024 -She was recently hospitalized for altered mental status and confusion, probably secondary to medication -She started first line systemic chemotherapy with weekly Taxol and trastuzumab/Perjeta on 10/25/22. She has tolerated well so far and her pain has improved overall -PET scan from Jan 01, 2023 showed complete metabolic response, no hypermetabolic uptake in diffuse bone lesions, no other new lesions.  She is clinically also doing much better.  Given her excellent response to chemo, we will change her Taxol to 2 weeks on and 1 week off -She Unfortunately developed more more brain metastasis on recent brain MRI. -I personally reviewed her restaging PET scan from April 13, 2023, which showed stable disease, no new lesions. -Due to her new brain metastasis, I changeD her email to Tucatinib, capecitabine  and trastuzumab, for better brain penetration. She started in late August 2024, tolerating well so far.  Her case was reviewed in CNS tumor board, Dr. Basilio Cairo plan to hold on whole brain radiation since she is starting new Her2 antibody and monitor her brain mets closely.  -she is also on Zometa every 3 months    Assessment and Plan    Metastatic Breast Cancer Follow-up for metastatic breast cancer. Currently on Xeloda (capecitabine), Tucatinib and trastuzumab. Reports improved gastrointestinal function and energy levels. Recent PET scan shows no new metastasis to me, the formal report is still pending; brain MRI stable. Blood counts normal. Discussed cardiotoxicity risk of Herceptin (trastuzumab). - Continue Xeloda (capecitabine) regimen - Proceed with Herceptin (trastuzumab) infusion today and continue every three weeks - Monitor heart function with echocardiogram every three months - Order echocardiogram for mid-December - Schedule follow-up MRI in December - Follow-up with neuro-oncologist Dr. Barbaraann Cao in January  General Health Maintenance Requires vaccinations and nutritional support. Has not received flu shot; needs higher caloric intake. - Administer flu shot today - Recommend Ensure Boost for higher calorie intake - Advise to get COVID and RSV vaccines at local pharmacy  Bone met -Continue calcium and vitamin D supplement -Continue Zometa every 3 months  Plan -Will continue current therapy, proceed with trastuzumab infusion today and continue every 3 weeks - Next appointment on December 5th -PET scan report is not back yet, will call her if needed - Follow-up with Lacie in three weeks.         SUMMARY OF ONCOLOGIC HISTORY: Oncology History Overview Note  Cancer Staging Malignant neoplasm of lower-inner quadrant of left breast in female, estrogen receptor positive ([name]) Staging form: Breast, AJCC 8th Edition - Clinical stage from 01/16/2018: Stage  IA (cT1c, cN0, cM0, G2,  ER+, PR+, HER2-) - Signed by Malachy Mood, MD on 01/23/2018 - Pathologic: Stage IA (pT1c, pN1, cM0, G1, ER+, PR+, HER2-) - Signed by Lonie Peak, MD on 04/09/2018     Malignant neoplasm of lower-inner quadrant of left breast in female, estrogen receptor positive ([name])  01/15/2018 Mammogram   Diagnositc Mammogram 01/15/18  IMPRESSION: 1. Suspicious 1.2 x 1.4 x 1.3 cm mixed echogenicity mass left breast 7 o'clock position retroareolar location at the site of palpable concern.. 2. Indeterminate Within the left breast 7:30 o'clock retroareolar location, adjacent to the palpable mass, is a 0.5 x 0.4 x 0.5 cm oval circumscribed hypoechoic mass. 3. Indeterminate calcifications within the lateral left breast. Location of these calcifications is not definitely confirmed on the true lateral view.    01/16/2018 Initial Biopsy   Diagnosis 01/16/18 1. Breast, left, needle core biopsy, 7:30 o'clock (ribbon clip) - FIBROCYSTIC CHANGES WITH SCLEROSING ADENOSIS AND CALCIFICATIONS. - FIBROADENOMATOID CHANGE. - NO MALIGNANCY IDENTIFIED. 2. Breast, left, needle core biopsy, 7 o'clock position (coil clip) - INVASIVE MAMMARY CARCINOMA, MSBR GRADE I/II. - SEE MICROSCOPIC DESCRIPTION Microscopic Comment  ADDENDUM: Immunohistochemistry for E-Cadherin is strongly positive in the tumor consistent with ductal carcinoma. (JDP:ah 01/17/18)   01/16/2018 Receptors her2   Estrogen Receptor: 100%, POSITIVE, STRONG STAINING INTENSITY Progesterone Receptor: 50%, POSITIVE, STRONG STAINING INTENSITY Proliferation Marker Ki67: 20% HER2 Negative   01/16/2018 Cancer Staging   Staging form: Breast, AJCC 8th Edition - Clinical stage from 01/16/2018: Stage IA (cT1c, cN0, cM0, G2, ER+, PR+, HER2-) - Signed by Malachy Mood, MD on 01/23/2018   01/22/2018 Initial Diagnosis   Malignant neoplasm of lower-inner quadrant of left breast in female, estrogen receptor positive ([name])   02/21/2018 Surgery    LEFT BREAST LUMPECTOMY WITH  AXILLARY LYMPH NODE BIOPSY by Dr. Donell Beers  02/21/18   02/21/2018 Pathology Results   Diagnosis 02/21/18 1. Breast, lumpectomy, Left - INVASIVE DUCTAL CARCINOMA, GRADE I, 1.6 CM. - DUCTAL CARCINOMA IN SITU, INTERMEDIATE NUCLEAR GRADE. - ANTERIOR AND MEDIAL RESECTION MARGINS ARE POSITIVE FOR CARCINOMA. - NEGATIVE FOR LYMPHOVASCULAR OR PERINEURAL INVASION. - BACKGROUND BREAST TISSUE WITH FIBROCYSTIC CHANGE, INCLUDING SCLEROSING ADENOSIS. - BIOPSY SITE CHANGES. - SEE ONCOLOGY TABLE. 2. Lymph node, sentinel, biopsy, Left Axillary #1 - METASTATIC BREAST CARCINOMA TO A LYMPH NODE, 1.0 CM IN GREATEST DIMENSION, WITH EXTRANODAL EXTENSION (1/1). 3. Lymph node, sentinel, biopsy, Left Axillary #2 - LYMPH NODE, NEGATIVE FOR CARCINOMA (0/1).    02/21/2018 Miscellaneous   Mammaprint 02/21/18 Low Risk with 10-year risk of recurrnce at 10% -No potential signifcant chemotherapy benefit   03/20/2018 Pathology Results   RE-EXCISION OF BREAST LUMPECTOMY by Dr. Donell Beers  Diagnosis 03/20/18 1. Breast, excision, Left new anterior margin - FIBROCYSTIC CHANGES WITH ADENOSIS AND CALCIFICATIONS. - HEALING BIOPSY SITE. - THERE IS NO EVIDENCE OF MALIGNANCY. 2. Breast, excision, Left new medial margin - FIBROCYSTIC CHANGES WITH ADENOSIS AND CALCIFICATIONS. - HEALING BIOPSY SITE. - THERE IS NO EVIDENCE OF MALIGNANCY. Microscopic Comment 1. -2. The surgical resection margin(s) of the specimen were inked and microscopically evaluated. (JBK:kh 03-22-18)   04/09/2018 Cancer Staging   Staging form: Breast, AJCC 8th Edition - Pathologic: Stage IA (pT1c, pN1, cM0, G1, ER+, PR+, HER2-) - Signed by Lonie Peak, MD on 04/09/2018   04/22/2018 - 06/03/2018 Radiation Therapy   Radaiton with Dr. Basilio Cairo 04/22/18-06/03/18   05/2018 -  Anti-estrogen oral therapy   Letrozole 2.5mg  started 05/2018    Survivorship   Per Santiago Glad, NP  05/04/2022 Imaging    IMPRESSION: Cervical spondylosis, as described.   Nonspecific  straightening of the expected cervical lordosis.   07/24/2022 Imaging    IMPRESSION: 1. Extensive osseous metastatic disease with pathologic fracture at the base of dens and C2 right lateral mass. Extraosseous tumor at C1 and C2 likely impinging on the right C2 and C3 nerve roots. 2. Degenerative cord impingement at C4-5 to C6-7. Biforaminal impingement at C5-6 and C6-7.   08/03/2022 PET scan    IMPRESSION: 1. Large volume osseous metastasis. 2. Low right cervical and probable right axillary nodal metastasis. 3. Subtle heterogeneous activity throughout the liver with suggestion of small liver lesions (likely new compared to chest CT of 07/16/2020). Findings are overall moderately suspicious for hepatic metastasis. Pre and post contrast abdominal MRI (preferred) or CT could confirm. 4. Right-sided pleural thickening and trace pleural fluid. Right base airspace disease is favored to represent chronic atelectasis. 5. Aortic atherosclerosis (ICD10-I70.0) and emphysema (ICD10-J43.9).     08/11/2022 Genetic Testing   Negative genetic testing on the CancerNext-Expanded+RNAinsight panel.  The report date is August 11, 2022.  The CancerNext-Expanded gene panel offered by [facility] and includes sequencing and rearrangement analysis for the following 77 genes: AIP, ALK, APC*, ATM*, AXIN2, BAP1, BARD1, BLM, BMPR1A, BRCA1*, BRCA2*, BRIP1*, CDC73, CDH1*, CDK4, CDKN1B, CDKN2A, CHEK2*, CTNNA1, DICER1, FANCC, FH, FLCN, GALNT12, KIF1B, LZTR1, MAX, MEN1, MET, MLH1*, MSH2*, MSH3, MSH6*, MUTYH*, NBN, NF1*, NF2, NTHL1, PALB2*, PHOX2B, PMS2*, POT1, PRKAR1A, PTCH1, PTEN*, RAD51C*, RAD51D*, RB1, RECQL, RET, SDHA, SDHAF2, SDHB, SDHC, SDHD, SMAD4, SMARCA4, SMARCB1, SMARCE1, STK11, SUFU, TMEM127, TP53*, TSC1, TSC2, VHL and XRCC2 (sequencing and deletion/duplication); EGFR, EGLN1, HOXB13, KIT, MITF, PDGFRA, POLD1, and POLE (sequencing only); EPCAM and GREM1 (deletion/duplication only). DNA and RNA analyses  performed for * genes.    Lobular carcinoma in situ (LCIS) of right breast  01/20/2020 Mammogram   Diagnostic Mammogram 01/20/20 IMPRESSION: 1.  Stable post lumpectomy changes of the left breast.   2. Suspicious microcalcifications over the right upper outer quadrant spanning 3.6 cm.   02/02/2020 Initial Biopsy   Diagnosis 02/02/20 Breast, right, needle core biopsy, upper outer quadrant, x clip - LOBULAR CARCINOMA IN SITU WITH PLEOMORPHIC FEATURES AND CALCIFICATIONS, INVOLVING ADENOSIS. SEE NOTE Diagnosis Note Immunohistochemical stain for E-cadherin is negative in the lesional cells, consistent with a lobular phenotype. Immunostains for p63, SMM 1 and calponin do not show evidence of invasive carcinoma.    02/04/2020 Initial Diagnosis   Lobular carcinoma in situ (LCIS) of right breast   03/18/2020 Surgery   RIGHT BREAST LUMPECTOMY WITH RADIOACTIVE SEED LOCALIZATION by Dr Alvira Monday    03/18/2020 Pathology Results   FINAL MICROSCOPIC DIAGNOSIS:   A. BREAST, RIGHT, LUMPECTOMY:  - Pleomorphic lobular carcinoma in situ with calcifications and  underlying complex sclerosing lesion, adenosis and fibroadenomatoid  change.  - Margins of resection are not involved (Closest margins: < 1 mm,  anterior, posterior, inferior and medial).  - Biopsy site.    COMMENT:   P63, Calponin and SMM-1 demonstrate the presence of myoepithelium in the  select focus.    Metastatic breast cancer to bone and brain  05/04/2022 Imaging    IMPRESSION: Cervical spondylosis, as described.   Nonspecific straightening of the expected cervical lordosis.   07/24/2022 Imaging    IMPRESSION: 1. Extensive osseous metastatic disease with pathologic fracture at the base of dens and C2 right lateral mass. Extraosseous tumor at C1 and C2 likely impinging on the right C2 and C3 nerve roots. 2. Degenerative cord  impingement at C4-5 to C6-7. Biforaminal impingement at C5-6 and C6-7.   08/02/2022 Initial Diagnosis    Cancer, metastatic to bone ([name])   08/03/2022 PET scan    IMPRESSION: 1. Large volume osseous metastasis. 2. Low right cervical and probable right axillary nodal metastasis. 3. Subtle heterogeneous activity throughout the liver with suggestion of small liver lesions (likely new compared to chest CT of 07/16/2020). Findings are overall moderately suspicious for hepatic metastasis. Pre and post contrast abdominal MRI (preferred) or CT could confirm. 4. Right-sided pleural thickening and trace pleural fluid. Right base airspace disease is favored to represent chronic atelectasis. 5. Aortic atherosclerosis (ICD10-I70.0) and emphysema (ICD10-J43.9).     10/26/2022 - 04/12/2023 Chemotherapy   Patient is on Treatment Plan : BREAST Paclitaxel D1,8,15 + Trastuzumab D1 + Pertuzumab D1 q21d x 8 cycles / Trastuzumab D1 + Pertuzumab D1 q21d x 4 cycles     05/03/2023 -  Chemotherapy   Patient is on Treatment Plan : BREAST MAINTENANCE Trastuzumab IV (6) or SQ (600) D1 q21d X 11 Cycles        Discussed the use of AI scribe software for clinical note transcription with the patient, who gave verbal consent to proceed.  History of Present Illness   A 62 year old patient with a known history of metastatic breast cancer presents for a routine follow-up. The patient reports feeling well and has adjusted to her current chemotherapy regimen, which includes Loza and tizanidine. She has not experienced any adverse effects from these medications. The patient's gastrointestinal symptoms have improved significantly, with her bowel movements returning to almost normal. She has also noticed hair regrowth, which she is pleased with.  The patient reports feeling energetic and has resumed normal activities around the house. She has been able to stay awake throughout the day without needing naps, and her sleep at night is good. The patient and her companion have been actively looking at houses, indicating an improvement in  her overall health and energy levels.  The patient also mentions a recent PET scan and a brain scan done last month. She is due for a follow-up MRI in December and has an appointment with a neuro-oncologist in January. The patient is also planning to move houses, indicating a positive outlook and planning for the future.         All other systems were reviewed with the patient and are negative.  MEDICAL HISTORY:  Past Medical History:  Diagnosis Date   Anxiety    Cancer ([name]) 2022   right breast LCIS   Cancer ([name]) 2019   left breast   Depression    Dysrhythmia    SVT, s/p ablation ~ 2012 in at [facility]   Ectopic pregnancy    Family history of breast cancer    Family history of melanoma    History of radiation therapy 04/22/18-06/03/18   Left Breast, left SCV, axilla 50 Gy in 25 fractions, Left breast boost 10 Gy in 5 fractions.    Hypothyroidism    Personal history of radiation therapy    PONV (postoperative nausea and vomiting)    Thyroid disease     SURGICAL HISTORY: Past Surgical History:  Procedure Laterality Date   APPENDECTOMY     BILATERAL SALPINGECTOMY     BREAST BIOPSY Right 2014   fibroadenoma   BREAST BIOPSY Left 01/16/2018   BREAST BIOPSY Right 02/02/2020   LCIS   BREAST BIOPSY Right 08/04/2020   x2 LCIS   BREAST BIOPSY Right 08/13/2020  x2   BREAST EXCISIONAL BIOPSY Left 1990   benign   BREAST EXCISIONAL BIOPSY Right 02/02/2020   LCIS   BREAST LUMPECTOMY Left 01/22/2018   BREAST LUMPECTOMY WITH AXILLARY LYMPH NODE BIOPSY Left 02/21/2018   Procedure: LEFT BREAST LUMPECTOMY WITH AXILLARY LYMPH NODE BIOPSY;  Surgeon: Almond Lint, MD;  Location: [facility] OR;  Service: General;  Laterality: Left;   BREAST LUMPECTOMY WITH RADIOACTIVE SEED LOCALIZATION Right 03/18/2020   Procedure: RIGHT BREAST LUMPECTOMY WITH RADIOACTIVE SEED LOCALIZATION;  Surgeon: Almond Lint, MD;  Location: [facility] OR;  Service: General;  Laterality: Right;   BREAST SURGERY Left  1993   cyst removed   ENDOMETRIAL ABLATION     EYE SURGERY     GLAUCOMA SURGERY Bilateral    IR IMAGING GUIDED PORT INSERTION  10/16/2022   POSTERIOR CERVICAL FUSION/FORAMINOTOMY N/A 08/17/2022   Procedure: Cervical One-Cervical Four POSTERIOR CERVICAL FUSION,  Cervical One LAMINECTOMY REDUCTION OF Cervical Two Fracture, Biopsy of Right Cerical Two Pars  Lesion;  Surgeon: Bedelia Person, MD;  Location: [facility] OR;  Service: Neurosurgery;  Laterality: N/A;   POSTERIOR CERVICAL FUSION/FORAMINOTOMY N/A 09/06/2022   Procedure: Posterior Cervical Fusion, Foraminotomy , Cervical Five-Six, Cervical Six-Seven; Extension of  fusion Cervical Four- Thoracic One;  Surgeon: Bedelia Person, MD;  Location: [facility] OR;  Service: Neurosurgery;  Laterality: N/A;   RADIOACTIVE SEED GUIDED EXCISIONAL BREAST BIOPSY Right 04/28/2021   Procedure: RADIOACTIVE SEED GUIDED EXCISIONAL RIGHT BREAST BIOPSY X2;  Surgeon: Almond Lint, MD;  Location: [facility];  Service: General;  Laterality: Right;   RE-EXCISION OF BREAST LUMPECTOMY Left 03/20/2018   Procedure: RE-EXCISION OF BREAST LUMPECTOMY;  Surgeon: Almond Lint, MD;  Location: [facility];  Service: General;  Laterality: Left;    I have reviewed the social history and family history with the patient and they are unchanged from previous note.  ALLERGIES:  is allergic to morphine and codeine.  MEDICATIONS:  Current Outpatient Medications  Medication Sig Dispense Refill   acetaminophen (TYLENOL) 500 MG tablet Take 2 tablets (1,000 mg total) by mouth every 8 (eight) hours. 30 tablet 0   capecitabine (XELODA) 500 MG tablet Take 2 tablets po BID after meals. Take for 7 days on then 7 days off. 56 tablet 0   citalopram (CELEXA) 40 MG tablet Take 1 tablet (40 mg total) by mouth daily. 30 tablet 3   diphenoxylate-atropine (LOMOTIL) 2.5-0.025 MG tablet Take 1 tablet by mouth 4 (four) times daily as needed for diarrhea or loose stools. 90 tablet 0    levothyroxine (SYNTHROID) 75 MCG tablet Take 1 tablet (75 mcg total) by mouth daily before breakfast. 30 tablet 0   lidocaine (XYLOCAINE) 2 % solution Patient: Mix 1part 2% viscous lidocaine, 1part H20. Swallow 10mL of diluted mixture, before meals and at bedtime, up to QID 100 mL 3   Loperamide HCl (IMODIUM PO) Take by mouth as needed.     LORazepam (ATIVAN) 0.5 MG tablet Take 1 tablet (0.5 mg total) by mouth every 6 (six) hours as needed for anxiety (agitation). 30 tablet 0   Mouthwashes (MOUTH RINSE) LIQD solution 15 mLs by Mouth Rinse route as needed (for oral care).  0   nicotine (NICODERM CQ - DOSED IN MG/24 HR) 7 mg/24hr patch Place 1 patch (7 mg total) onto the skin daily. 28 patch 0   ondansetron (ZOFRAN) 8 MG tablet Take 1 tablet (8 mg total) by mouth every 8 (eight) hours as needed for nausea or vomiting. 20  tablet 1   OVER THE COUNTER MEDICATION Take 1 tablet by mouth daily. Protandim Supplement     oxyCODONE (OXY IR/ROXICODONE) 5 MG immediate release tablet Take 0.5-1 tablets (2.5-5 mg total) by mouth every 4 (four) hours as needed for moderate pain or severe pain (dyspnea). 30 tablet 0   pregabalin (LYRICA) 25 MG capsule Take 1 capsule (25 mg total) by mouth 2 (two) times daily. 60 capsule 2   senna-docusate (SENOKOT-S) 8.6-50 MG tablet Take 1 tablet by mouth 2 (two) times daily. 60 tablet 0   tucatinib (TUKYSA) 150 MG tablet Take 2 tablets (300 mg total) by mouth 2 (two) times daily. Take as instructed per MD. 120 tablet 2   No current facility-administered medications for this visit.    PHYSICAL EXAMINATION: ECOG PERFORMANCE STATUS: 2 - Symptomatic, <50% confined to bed  Vitals:   07/12/23 0827  BP: 109/69  Pulse: 93  Resp: 16  Temp: 97.7 F (36.5 C)  SpO2: 100%   Wt Readings from Last 3 Encounters:  07/12/23 74 lb (33.6 kg)  06/21/23 75 lb 7 oz (34.2 kg)  06/05/23 76 lb (34.5 kg)     GENERAL:alert, no distress and comfortable SKIN: skin color, texture,  turgor are normal, no rashes or significant lesions EYES: normal, Conjunctiva are pink and non-injected, sclera clear NECK: supple, thyroid normal size, non-tender, without nodularity LYMPH:  no palpable lymphadenopathy in the cervical, axillary  LUNGS: clear to auscultation and percussion with normal breathing effort HEART: regular rate & rhythm and no murmurs and no lower extremity edema ABDOMEN:abdomen soft, non-tender and normal bowel sounds Musculoskeletal:no cyanosis of digits and no clubbing  NEURO: alert & oriented x 3 with fluent speech, no focal motor/sensory deficits    LABORATORY DATA:  I have reviewed the data as listed    Latest Ref Rng & Units 07/12/2023    8:05 AM 06/21/2023    8:09 AM 05/31/2023    8:20 AM  CBC  WBC 4.0 - 10.5 K/uL 4.8  5.4  5.4   Hemoglobin 12.0 - 15.0 g/dL 08.6  57.8  46.9   Hematocrit 36.0 - 46.0 % 44.0  43.8  43.4   Platelets 150 - 400 K/uL 232  206  239         Latest Ref Rng & Units 07/12/2023    8:05 AM 06/21/2023    8:09 AM 05/31/2023    8:20 AM  CMP  Glucose 70 - 99 mg/dL 629  528  413   BUN 8 - 23 mg/dL 13  16  12    Creatinine 0.44 - 1.00 mg/dL 2.44  0.10  2.72   Sodium 135 - 145 mmol/L 139  138  139   Potassium 3.5 - 5.1 mmol/L 3.7  4.0  3.6   Chloride 98 - 111 mmol/L 102  103  103   CO2 22 - 32 mmol/L 30  30  30    Calcium 8.9 - 10.3 mg/dL 9.5  9.6  9.8   Total Protein 6.5 - 8.1 g/dL 6.7  6.6  6.9   Total Bilirubin <1.2 mg/dL 0.4  0.6  0.8   Alkaline Phos 38 - 126 U/L 114  92  103   AST 15 - 41 U/L 23  21  22    ALT 0 - 44 U/L 19  17  14        RADIOGRAPHIC STUDIES: I have personally reviewed the radiological images as listed and agreed with the findings in the report. No results  found.    Orders Placed This Encounter  Procedures   ECHOCARDIOGRAM COMPLETE    Standing Status:   Future    Standing Expiration Date:   07/11/2024    Order Specific Question:   Where should this test be performed    Answer:   [facility]     Order Specific Question:   Perflutren DEFINITY (image enhancing agent) should be administered unless hypersensitivity or allergy exist    Answer:   Administer Perflutren    Order Specific Question:   Reason for exam-Echo    Answer:   Chemo  [id number]   All questions were answered. The patient knows to call the clinic with any problems, questions or concerns. No barriers to learning was detected. The total time spent in the appointment was 25 minutes.     Malachy Mood, MD 07/12/2023

## 2023-07-19 ENCOUNTER — Other Ambulatory Visit: Payer: Self-pay

## 2023-07-19 DIAGNOSIS — R197 Diarrhea, unspecified: Secondary | ICD-10-CM

## 2023-07-19 DIAGNOSIS — Z515 Encounter for palliative care: Secondary | ICD-10-CM

## 2023-07-19 DIAGNOSIS — C7951 Secondary malignant neoplasm of bone: Secondary | ICD-10-CM

## 2023-07-19 MED ORDER — DIPHENOXYLATE-ATROPINE 2.5-0.025 MG PO TABS: 1.0000 | ORAL_TABLET | Freq: Four times a day (QID) | ORAL | 0 refills | Status: DC | PRN

## 2023-07-24 ENCOUNTER — Other Ambulatory Visit: Payer: Self-pay | Admitting: Nurse Practitioner

## 2023-07-24 DIAGNOSIS — C7951 Secondary malignant neoplasm of bone: Secondary | ICD-10-CM

## 2023-07-27 NOTE — Progress Notes (Signed)
Palliative Medicine [facility]  [phone]) [phone] [phone]) [phone]   Name: Martha [name] Date: 07/27/2023 MRN: [id number]  DOB: 25-Oct-1960  Patient Care Team: Ardith Dark, MD as PCP - General (Family Medicine) Almond Lint, MD as Consulting Physician (General Surgery) Malachy Mood, MD as Consulting Physician (Hematology) Lonie Peak, MD as Attending Physician (Radiation Oncology) Pollyann Samples, NP as Nurse Practitioner (Nurse Practitioner)    INTERVAL HISTORY: Martha [name] is a 62 y.o. female with oncologic medical history including ER+ breast cancer (01/2018), right breast cancer (04/2021), now with metastatic disease progression involving brain and liver, pathological fractures with osseous mets s/p brain radiation. .  Palliative ask to see for symptom management and goals of care.   SOCIAL HISTORY:    Martha [name] reports that she has been smoking cigarettes. She has a 20 pack-year smoking history. She has never used smokeless tobacco. She reports that she does not currently use alcohol after a past usage of about 28.0 standard drinks of alcohol per week. She reports that she does not use drugs.  ADVANCE DIRECTIVES:  Has documents on file  CODE STATUS: DNR  PAST MEDICAL HISTORY: Past Medical History:  Diagnosis Date   Anxiety    Cancer ([name]) 2022   right breast LCIS   Cancer ([name]) 2019   left breast   Depression    Dysrhythmia    SVT, s/p ablation ~ 2012 in at [facility]   Ectopic pregnancy    Family history of breast cancer    Family history of melanoma    History of radiation therapy 04/22/18-06/03/18   Left Breast, left SCV, axilla 50 Gy in 25 fractions, Left breast boost 10 Gy in 5 fractions.    Hypothyroidism    Personal history of radiation therapy    PONV (postoperative nausea and vomiting)    Thyroid disease     ALLERGIES:  is allergic to morphine and codeine.  MEDICATIONS:  Current Outpatient  Medications  Medication Sig Dispense Refill   acetaminophen (TYLENOL) 500 MG tablet Take 2 tablets (1,000 mg total) by mouth every 8 (eight) hours. 30 tablet 0   capecitabine (XELODA) 500 MG tablet TAKE 2 TABLETS TWICE DAILY FOR 7 DAYS ON THEN 7 DAYS OFF. TAKE AFTER MEALS 56 tablet 0   citalopram (CELEXA) 40 MG tablet TAKE 1 TABLET BY MOUTH EVERY DAY 30 tablet 3   diphenoxylate-atropine (LOMOTIL) 2.5-0.025 MG tablet Take 1 tablet by mouth 4 (four) times daily as needed for diarrhea or loose stools. 90 tablet 0   levothyroxine (SYNTHROID) 75 MCG tablet Take 1 tablet (75 mcg total) by mouth daily before breakfast. 30 tablet 0   lidocaine (XYLOCAINE) 2 % solution Patient: Mix 1part 2% viscous lidocaine, 1part H20. Swallow 10mL of diluted mixture, before meals and at bedtime, up to QID 100 mL 3   Loperamide HCl (IMODIUM PO) Take by mouth as needed.     LORazepam (ATIVAN) 0.5 MG tablet Take 1 tablet (0.5 mg total) by mouth every 6 (six) hours as needed for anxiety (agitation). 30 tablet 0   Mouthwashes (MOUTH RINSE) LIQD solution 15 mLs by Mouth Rinse route as needed (for oral care).  0   nicotine (NICODERM CQ - DOSED IN MG/24 HR) 7 mg/24hr patch Place 1 patch (7 mg total) onto the skin daily. 28 patch 0   ondansetron (ZOFRAN) 8 MG tablet Take 1 tablet (8 mg total) by mouth every 8 (eight) hours as needed  for nausea or vomiting. 20 tablet 1   OVER THE COUNTER MEDICATION Take 1 tablet by mouth daily. Protandim Supplement     oxyCODONE (OXY IR/ROXICODONE) 5 MG immediate release tablet Take 0.5-1 tablets (2.5-5 mg total) by mouth every 4 (four) hours as needed for moderate pain or severe pain (dyspnea). 30 tablet 0   pregabalin (LYRICA) 25 MG capsule Take 1 capsule (25 mg total) by mouth 2 (two) times daily. 60 capsule 2   senna-docusate (SENOKOT-S) 8.6-50 MG tablet Take 1 tablet by mouth 2 (two) times daily. 60 tablet 0   tucatinib (TUKYSA) 150 MG tablet Take 2 tablets (300 mg total) by mouth 2 (two)  times daily. Take as instructed per MD. 120 tablet 2   No current facility-administered medications for this visit.    VITAL SIGNS: There were no vitals taken for this visit. There were no vitals filed for this visit.  Estimated body mass index is 13.53 kg/m as calculated from the following:   Height as of 07/12/23: 5\' 2"  (1.575 m).   Weight as of 07/12/23: 74 lb (33.6 kg).   PERFORMANCE STATUS (ECOG) : 3 - Symptomatic, >50% confined to bed   Physical Exam General: NAD, thin   Cardiovascular: regular rate and rhythm Pulmonary: normal breathing pattern Abdomen: soft, nontender, + bowel sounds Extremities: no edema, no joint deformities Skin: no rashes, muscle wasting  Neurological: alert, oriented x 4  IMPRESSION: Martha [name] presented to clinic for follow-up. No acute distress. Her husband is present. Denies nausea, vomiting. Is taking things one day at a time. Occasional diarrhea which is controlled with lomotil. Patient shares they are in the process of selling their home and closing on their new home. She is looking forward to a fresh start at the new year.   Decreased appetite/weight loss Patient reports improvement in appetite. Current weight stable at 75lbs. She is enjoying eating foods that she enjoys. Drinking daily protein shakes.    Pain Martha [name] shares pain is minimal. Occasional aches and pains. Lyrica as prescribed. Tylenol as needed. Describes pain in sacral and back area. Some stiffness and tenderness to neck area.   No symptom management needs at this time. Will continue to support as needed.   PLAN: Continue intake of Ensure and soft, high-calorie foods. Continue Lyrica 25 mg twice daily. Tylenol as needed for mild aches and pain.  Protective skin barrier to sacral area. Ongoing goals of care discussions and symptom management Palliative will plan to see patient back in 4-6 weeks in collaboration to other oncology appointments.  Patient and husband knows  to contact office sooner if needed.   Patient expressed understanding and was in agreement with this plan. She also understands that She can call the clinic at any time with any questions, concerns, or complaints.      Any controlled substances utilized were prescribed in the context of palliative care. PDMP has been reviewed.   Visit consisted of counseling and education dealing with the complex and emotionally intense issues of symptom management and palliative care in the setting of serious and potentially life-threatening illness.  Willette Alma, AGPCNP-BC  Palliative Medicine Team/[facility]

## 2023-08-02 ENCOUNTER — Inpatient Hospital Stay (HOSPITAL_BASED_OUTPATIENT_CLINIC_OR_DEPARTMENT_OTHER): Payer: BC Managed Care – PPO | Admitting: Nurse Practitioner

## 2023-08-02 ENCOUNTER — Inpatient Hospital Stay: Payer: BC Managed Care – PPO | Attending: Genetic Counselor

## 2023-08-02 ENCOUNTER — Inpatient Hospital Stay: Payer: BC Managed Care – PPO

## 2023-08-02 ENCOUNTER — Encounter: Payer: Self-pay | Admitting: Nurse Practitioner

## 2023-08-02 VITALS — BP 118/60 | HR 87 | Temp 98.0°F | Resp 17 | Wt 75.2 lb

## 2023-08-02 DIAGNOSIS — C7951 Secondary malignant neoplasm of bone: Secondary | ICD-10-CM

## 2023-08-02 DIAGNOSIS — C787 Secondary malignant neoplasm of liver and intrahepatic bile duct: Secondary | ICD-10-CM | POA: Diagnosis not present

## 2023-08-02 DIAGNOSIS — C7931 Secondary malignant neoplasm of brain: Secondary | ICD-10-CM | POA: Insufficient documentation

## 2023-08-02 DIAGNOSIS — R530 Neoplastic (malignant) related fatigue: Secondary | ICD-10-CM | POA: Diagnosis not present

## 2023-08-02 DIAGNOSIS — R197 Diarrhea, unspecified: Secondary | ICD-10-CM

## 2023-08-02 DIAGNOSIS — Z5112 Encounter for antineoplastic immunotherapy: Secondary | ICD-10-CM | POA: Insufficient documentation

## 2023-08-02 DIAGNOSIS — Z170 Estrogen receptor positive status [ER+]: Secondary | ICD-10-CM | POA: Insufficient documentation

## 2023-08-02 DIAGNOSIS — C50312 Malignant neoplasm of lower-inner quadrant of left female breast: Secondary | ICD-10-CM | POA: Diagnosis not present

## 2023-08-02 DIAGNOSIS — G893 Neoplasm related pain (acute) (chronic): Secondary | ICD-10-CM

## 2023-08-02 DIAGNOSIS — C799 Secondary malignant neoplasm of unspecified site: Secondary | ICD-10-CM

## 2023-08-02 DIAGNOSIS — Z9221 Personal history of antineoplastic chemotherapy: Secondary | ICD-10-CM | POA: Insufficient documentation

## 2023-08-02 DIAGNOSIS — Z515 Encounter for palliative care: Secondary | ICD-10-CM | POA: Diagnosis not present

## 2023-08-02 DIAGNOSIS — Z923 Personal history of irradiation: Secondary | ICD-10-CM | POA: Insufficient documentation

## 2023-08-02 LAB — CBC WITH DIFFERENTIAL (CANCER CENTER ONLY)
Abs Immature Granulocytes: 0.02 10*3/uL (ref 0.00–0.07)
Basophils Absolute: 0 10*3/uL (ref 0.0–0.1)
Basophils Relative: 0 %
Eosinophils Absolute: 0.1 10*3/uL (ref 0.0–0.5)
Eosinophils Relative: 2 %
HCT: 43 % (ref 36.0–46.0)
Hemoglobin: 14.7 g/dL (ref 12.0–15.0)
Immature Granulocytes: 0 %
Lymphocytes Relative: 10 %
Lymphs Abs: 0.7 10*3/uL (ref 0.7–4.0)
MCH: 33.8 pg (ref 26.0–34.0)
MCHC: 34.2 g/dL (ref 30.0–36.0)
MCV: 98.9 fL (ref 80.0–100.0)
Monocytes Absolute: 0.6 10*3/uL (ref 0.1–1.0)
Monocytes Relative: 8 %
Neutro Abs: 6.1 10*3/uL (ref 1.7–7.7)
Neutrophils Relative %: 80 %
Platelet Count: 289 10*3/uL (ref 150–400)
RBC: 4.35 MIL/uL (ref 3.87–5.11)
RDW: 15.9 % — ABNORMAL HIGH (ref 11.5–15.5)
WBC Count: 7.6 10*3/uL (ref 4.0–10.5)
nRBC: 0 % (ref 0.0–0.2)

## 2023-08-02 LAB — CMP (CANCER CENTER ONLY)
ALT: 17 U/L (ref 0–44)
AST: 18 U/L (ref 15–41)
Albumin: 4 g/dL (ref 3.5–5.0)
Alkaline Phosphatase: 133 U/L — ABNORMAL HIGH (ref 38–126)
Anion gap: 6 (ref 5–15)
BUN: 13 mg/dL (ref 8–23)
CO2: 29 mmol/L (ref 22–32)
Calcium: 9.7 mg/dL (ref 8.9–10.3)
Chloride: 102 mmol/L (ref 98–111)
Creatinine: 0.46 mg/dL (ref 0.44–1.00)
GFR, Estimated: >60 mL/min (ref >60)
Glucose, Bld: 122 mg/dL — ABNORMAL HIGH (ref 70–99)
Potassium: 4 mmol/L (ref 3.5–5.1)
Sodium: 137 mmol/L (ref 135–145)
Total Bilirubin: 0.7 mg/dL (ref <1.2)
Total Protein: 6.5 g/dL (ref 6.5–8.1)

## 2023-08-02 MED ORDER — SODIUM CHLORIDE 0.9 % IV SOLN
Freq: Once | INTRAVENOUS | Status: AC
Start: 2023-08-02 — End: 2023-08-02

## 2023-08-02 MED ORDER — DIPHENHYDRAMINE HCL 25 MG PO CAPS
50.0000 mg | ORAL_CAPSULE | Freq: Once | ORAL | Status: AC
  Administered 2023-08-02: 50 mg via ORAL
  Filled 2023-08-02: qty 2

## 2023-08-02 MED ORDER — SODIUM CHLORIDE 0.9% FLUSH
10.0000 mL | INTRAVENOUS | Status: DC | PRN
  Administered 2023-08-02: 10 mL

## 2023-08-02 MED ORDER — ACETAMINOPHEN 325 MG PO TABS
650.0000 mg | ORAL_TABLET | Freq: Once | ORAL | Status: AC
  Administered 2023-08-02: 650 mg via ORAL
  Filled 2023-08-02: qty 2

## 2023-08-02 MED ORDER — TRASTUZUMAB-ANNS CHEMO 150 MG IV SOLR
6.0000 mg/kg | Freq: Once | INTRAVENOUS | Status: AC
  Administered 2023-08-02: 210 mg via INTRAVENOUS
  Filled 2023-08-02: qty 10

## 2023-08-02 MED ORDER — HEPARIN SOD (PORK) LOCK FLUSH 100 UNIT/ML IV SOLN
500.0000 [IU] | Freq: Once | INTRAVENOUS | Status: AC | PRN
Start: 2023-08-02 — End: 2023-08-02
  Administered 2023-08-02: 500 [IU]

## 2023-08-02 NOTE — Progress Notes (Signed)
Patient Care Team: Ardith Dark, MD as PCP - General (Family Medicine) Almond Lint, MD as Consulting Physician (General Surgery) Malachy Mood, MD as Consulting Physician (Hematology) Lonie Peak, MD as Attending Physician (Radiation Oncology) Pollyann Samples, NP as Nurse Practitioner (Nurse Practitioner)   CHIEF COMPLAINT: Follow-up metastatic breast cancer  Oncology History Overview Note  Cancer Staging Malignant neoplasm of lower-inner quadrant of left breast in female, estrogen receptor positive [facility]) Staging form: Breast, AJCC 8th Edition - Clinical stage from 01/16/2018: Stage IA (cT1c, cN0, cM0, G2, ER+, PR+, HER2-) - Signed by Malachy Mood, MD on 01/23/2018 - Pathologic: Stage IA (pT1c, pN1, cM0, G1, ER+, PR+, HER2-) - Signed by Lonie Peak, MD on 04/09/2018     Malignant neoplasm of lower-inner quadrant of left breast in female, estrogen receptor positive ([name])  01/15/2018 Mammogram   Diagnositc Mammogram 01/15/18  IMPRESSION: 1. Suspicious 1.2 x 1.4 x 1.3 cm mixed echogenicity mass left breast 7 o'clock position retroareolar location at the site of palpable concern.. 2. Indeterminate Within the left breast 7:30 o'clock retroareolar location, adjacent to the palpable mass, is a 0.5 x 0.4 x 0.5 cm oval circumscribed hypoechoic mass. 3. Indeterminate calcifications within the lateral left breast. Location of these calcifications is not definitely confirmed on the true lateral view.    01/16/2018 Initial Biopsy   Diagnosis 01/16/18 1. Breast, left, needle core biopsy, 7:30 o'clock (ribbon clip) - FIBROCYSTIC CHANGES WITH SCLEROSING ADENOSIS AND CALCIFICATIONS. - FIBROADENOMATOID CHANGE. - NO MALIGNANCY IDENTIFIED. 2. Breast, left, needle core biopsy, 7 o'clock position (coil clip) - INVASIVE MAMMARY CARCINOMA, MSBR GRADE I/II. - SEE MICROSCOPIC DESCRIPTION Microscopic Comment  ADDENDUM: Immunohistochemistry for E-Cadherin is strongly positive in the tumor consistent  with ductal carcinoma. (JDP:ah 01/17/18)   01/16/2018 Receptors her2   Estrogen Receptor: 100%, POSITIVE, STRONG STAINING INTENSITY Progesterone Receptor: 50%, POSITIVE, STRONG STAINING INTENSITY Proliferation Marker Ki67: 20% HER2 Negative   01/16/2018 Cancer Staging   Staging form: Breast, AJCC 8th Edition - Clinical stage from 01/16/2018: Stage IA (cT1c, cN0, cM0, G2, ER+, PR+, HER2-) - Signed by Malachy Mood, MD on 01/23/2018   01/22/2018 Initial Diagnosis   Malignant neoplasm of lower-inner quadrant of left breast in female, estrogen receptor positive ([name])   02/21/2018 Surgery    LEFT BREAST LUMPECTOMY WITH AXILLARY LYMPH NODE BIOPSY by Dr. Donell Beers  02/21/18   02/21/2018 Pathology Results   Diagnosis 02/21/18 1. Breast, lumpectomy, Left - INVASIVE DUCTAL CARCINOMA, GRADE I, 1.6 CM. - DUCTAL CARCINOMA IN SITU, INTERMEDIATE NUCLEAR GRADE. - ANTERIOR AND MEDIAL RESECTION MARGINS ARE POSITIVE FOR CARCINOMA. - NEGATIVE FOR LYMPHOVASCULAR OR PERINEURAL INVASION. - BACKGROUND BREAST TISSUE WITH FIBROCYSTIC CHANGE, INCLUDING SCLEROSING ADENOSIS. - BIOPSY SITE CHANGES. - SEE ONCOLOGY TABLE. 2. Lymph node, sentinel, biopsy, Left Axillary #1 - METASTATIC BREAST CARCINOMA TO A LYMPH NODE, 1.0 CM IN GREATEST DIMENSION, WITH EXTRANODAL EXTENSION (1/1). 3. Lymph node, sentinel, biopsy, Left Axillary #2 - LYMPH NODE, NEGATIVE FOR CARCINOMA (0/1).    02/21/2018 Miscellaneous   Mammaprint 02/21/18 Low Risk with 10-year risk of recurrnce at 10% -No potential signifcant chemotherapy benefit   03/20/2018 Pathology Results   RE-EXCISION OF BREAST LUMPECTOMY by Dr. Donell Beers  Diagnosis 03/20/18 1. Breast, excision, Left new anterior margin - FIBROCYSTIC CHANGES WITH ADENOSIS AND CALCIFICATIONS. - HEALING BIOPSY SITE. - THERE IS NO EVIDENCE OF MALIGNANCY. 2. Breast, excision, Left new medial margin - FIBROCYSTIC CHANGES WITH ADENOSIS AND CALCIFICATIONS. - HEALING BIOPSY SITE. - THERE IS NO EVIDENCE OF  MALIGNANCY. Microscopic  Comment 1. -2. The surgical resection margin(s) of the specimen were inked and microscopically evaluated. (JBK:kh 03-22-18)   04/09/2018 Cancer Staging   Staging form: Breast, AJCC 8th Edition - Pathologic: Stage IA (pT1c, pN1, cM0, G1, ER+, PR+, HER2-) - Signed by Lonie Peak, MD on 04/09/2018   04/22/2018 - 06/03/2018 Radiation Therapy   Radaiton with Dr. Basilio Cairo 04/22/18-06/03/18   05/2018 -  Anti-estrogen oral therapy   Letrozole 2.5mg  started 05/2018    Survivorship   Per Santiago Glad, NP    05/04/2022 Imaging    IMPRESSION: Cervical spondylosis, as described.   Nonspecific straightening of the expected cervical lordosis.   07/24/2022 Imaging    IMPRESSION: 1. Extensive osseous metastatic disease with pathologic fracture at the base of dens and C2 right lateral mass. Extraosseous tumor at C1 and C2 likely impinging on the right C2 and C3 nerve roots. 2. Degenerative cord impingement at C4-5 to C6-7. Biforaminal impingement at C5-6 and C6-7.   08/03/2022 PET scan    IMPRESSION: 1. Large volume osseous metastasis. 2. Low right cervical and probable right axillary nodal metastasis. 3. Subtle heterogeneous activity throughout the liver with suggestion of small liver lesions (likely new compared to chest CT of 07/16/2020). Findings are overall moderately suspicious for hepatic metastasis. Pre and post contrast abdominal MRI (preferred) or CT could confirm. 4. Right-sided pleural thickening and trace pleural fluid. Right base airspace disease is favored to represent chronic atelectasis. 5. Aortic atherosclerosis (ICD10-I70.0) and emphysema (ICD10-J43.9).     08/11/2022 Genetic Testing   Negative genetic testing on the CancerNext-Expanded+RNAinsight panel.  The report date is August 11, 2022.  The CancerNext-Expanded gene panel offered by [facility] and includes sequencing and rearrangement analysis for the following 77 genes: AIP, ALK, APC*,  ATM*, AXIN2, BAP1, BARD1, BLM, BMPR1A, BRCA1*, BRCA2*, BRIP1*, CDC73, CDH1*, CDK4, CDKN1B, CDKN2A, CHEK2*, CTNNA1, DICER1, FANCC, FH, FLCN, GALNT12, KIF1B, LZTR1, MAX, MEN1, MET, MLH1*, MSH2*, MSH3, MSH6*, MUTYH*, NBN, NF1*, NF2, NTHL1, PALB2*, PHOX2B, PMS2*, POT1, PRKAR1A, PTCH1, PTEN*, RAD51C*, RAD51D*, RB1, RECQL, RET, SDHA, SDHAF2, SDHB, SDHC, SDHD, SMAD4, SMARCA4, SMARCB1, SMARCE1, STK11, SUFU, TMEM127, TP53*, TSC1, TSC2, VHL and XRCC2 (sequencing and deletion/duplication); EGFR, EGLN1, HOXB13, KIT, MITF, PDGFRA, POLD1, and POLE (sequencing only); EPCAM and GREM1 (deletion/duplication only). DNA and RNA analyses performed for * genes.    Lobular carcinoma in situ (LCIS) of right breast  01/20/2020 Mammogram   Diagnostic Mammogram 01/20/20 IMPRESSION: 1.  Stable post lumpectomy changes of the left breast.   2. Suspicious microcalcifications over the right upper outer quadrant spanning 3.6 cm.   02/02/2020 Initial Biopsy   Diagnosis 02/02/20 Breast, right, needle core biopsy, upper outer quadrant, x clip - LOBULAR CARCINOMA IN SITU WITH PLEOMORPHIC FEATURES AND CALCIFICATIONS, INVOLVING ADENOSIS. SEE NOTE Diagnosis Note Immunohistochemical stain for E-cadherin is negative in the lesional cells, consistent with a lobular phenotype. Immunostains for p63, SMM 1 and calponin do not show evidence of invasive carcinoma.    02/04/2020 Initial Diagnosis   Lobular carcinoma in situ (LCIS) of right breast   03/18/2020 Surgery   RIGHT BREAST LUMPECTOMY WITH RADIOACTIVE SEED LOCALIZATION by Dr Alvira Monday    03/18/2020 Pathology Results   FINAL MICROSCOPIC DIAGNOSIS:   A. BREAST, RIGHT, LUMPECTOMY:  - Pleomorphic lobular carcinoma in situ with calcifications and  underlying complex sclerosing lesion, adenosis and fibroadenomatoid  change.  - Margins of resection are not involved (Closest margins: < 1 mm,  anterior, posterior, inferior and medial).  - Biopsy site.    COMMENT:  P63, Calponin and SMM-1  demonstrate the presence of myoepithelium in the  select focus.    Metastatic breast cancer to bone and brain  05/04/2022 Imaging    IMPRESSION: Cervical spondylosis, as described.   Nonspecific straightening of the expected cervical lordosis.   07/24/2022 Imaging    IMPRESSION: 1. Extensive osseous metastatic disease with pathologic fracture at the base of dens and C2 right lateral mass. Extraosseous tumor at C1 and C2 likely impinging on the right C2 and C3 nerve roots. 2. Degenerative cord impingement at C4-5 to C6-7. Biforaminal impingement at C5-6 and C6-7.   08/02/2022 Initial Diagnosis   Cancer, metastatic to bone ([name])   08/03/2022 PET scan    IMPRESSION: 1. Large volume osseous metastasis. 2. Low right cervical and probable right axillary nodal metastasis. 3. Subtle heterogeneous activity throughout the liver with suggestion of small liver lesions (likely new compared to chest CT of 07/16/2020). Findings are overall moderately suspicious for hepatic metastasis. Pre and post contrast abdominal MRI (preferred) or CT could confirm. 4. Right-sided pleural thickening and trace pleural fluid. Right base airspace disease is favored to represent chronic atelectasis. 5. Aortic atherosclerosis (ICD10-I70.0) and emphysema (ICD10-J43.9).     10/26/2022 - 04/12/2023 Chemotherapy   Patient is on Treatment Plan : BREAST Paclitaxel D1,8,15 + Trastuzumab D1 + Pertuzumab D1 q21d x 8 cycles / Trastuzumab D1 + Pertuzumab D1 q21d x 4 cycles     05/03/2023 -  Chemotherapy   Patient is on Treatment Plan : BREAST MAINTENANCE Trastuzumab IV (6) or SQ (600) D1 q21d X 11 Cycles        CURRENT THERAPY:  -Zometa q3 months -starting 09/2022: Paclitaxel D1,8,15 + Trastuzumabd D1 + Pertuzumab D1 q21d  -Changed to second line Xeloda (100 mg BID for 7 days on/ 7 days off), Tucatinib ( 2 tabs BID) and Trastuzumab q3 weeks, starting 03/2023  INTERVAL HISTORY Martha [name] returns for follow-up as  scheduled.  She continues Tucatinib, Xeloda, and trastuzumab.  Ran out of Xeloda for a couple days but is back on track and is coming to the end of the current med week. Tolerating well with stable diarrhea.  They closed on a house and will move soon, has someone to help her pack up.  Pain is stable, well-managed on the current regimen.  Denies any changes or new/specific concerns.  ROS  All other systems reviewed and negative  Past Medical History:  Diagnosis Date   Anxiety    Cancer ([name]) 2022   right breast LCIS   Cancer ([name]) 2019   left breast   Depression    Dysrhythmia    SVT, s/p ablation ~ 2012 in at [facility]   Ectopic pregnancy    Family history of breast cancer    Family history of melanoma    History of radiation therapy 04/22/18-06/03/18   Left Breast, left SCV, axilla 50 Gy in 25 fractions, Left breast boost 10 Gy in 5 fractions.    Hypothyroidism    Personal history of radiation therapy    PONV (postoperative nausea and vomiting)    Thyroid disease      Past Surgical History:  Procedure Laterality Date   APPENDECTOMY     BILATERAL SALPINGECTOMY     BREAST BIOPSY Right 2014   fibroadenoma   BREAST BIOPSY Left 01/16/2018   BREAST BIOPSY Right 02/02/2020   LCIS   BREAST BIOPSY Right 08/04/2020   x2 LCIS   BREAST BIOPSY Right 08/13/2020   x2  BREAST EXCISIONAL BIOPSY Left 1990   benign   BREAST EXCISIONAL BIOPSY Right 02/02/2020   LCIS   BREAST LUMPECTOMY Left 01/22/2018   BREAST LUMPECTOMY WITH AXILLARY LYMPH NODE BIOPSY Left 02/21/2018   Procedure: LEFT BREAST LUMPECTOMY WITH AXILLARY LYMPH NODE BIOPSY;  Surgeon: Almond Lint, MD;  Location: [facility] OR;  Service: General;  Laterality: Left;   BREAST LUMPECTOMY WITH RADIOACTIVE SEED LOCALIZATION Right 03/18/2020   Procedure: RIGHT BREAST LUMPECTOMY WITH RADIOACTIVE SEED LOCALIZATION;  Surgeon: Almond Lint, MD;  Location: [facility] OR;  Service: General;  Laterality: Right;   BREAST SURGERY Left 1993    cyst removed   ENDOMETRIAL ABLATION     EYE SURGERY     GLAUCOMA SURGERY Bilateral    IR IMAGING GUIDED PORT INSERTION  10/16/2022   POSTERIOR CERVICAL FUSION/FORAMINOTOMY N/A 08/17/2022   Procedure: Cervical One-Cervical Four POSTERIOR CERVICAL FUSION,  Cervical One LAMINECTOMY REDUCTION OF Cervical Two Fracture, Biopsy of Right Cerical Two Pars  Lesion;  Surgeon: Bedelia Person, MD;  Location: [facility] OR;  Service: Neurosurgery;  Laterality: N/A;   POSTERIOR CERVICAL FUSION/FORAMINOTOMY N/A 09/06/2022   Procedure: Posterior Cervical Fusion, Foraminotomy , Cervical Five-Six, Cervical Six-Seven; Extension of  fusion Cervical Four- Thoracic One;  Surgeon: Bedelia Person, MD;  Location: [facility] OR;  Service: Neurosurgery;  Laterality: N/A;   RADIOACTIVE SEED GUIDED EXCISIONAL BREAST BIOPSY Right 04/28/2021   Procedure: RADIOACTIVE SEED GUIDED EXCISIONAL RIGHT BREAST BIOPSY X2;  Surgeon: Almond Lint, MD;  Location: [facility];  Service: General;  Laterality: Right;   RE-EXCISION OF BREAST LUMPECTOMY Left 03/20/2018   Procedure: RE-EXCISION OF BREAST LUMPECTOMY;  Surgeon: Almond Lint, MD;  Location:  SURGERY CENTER;  Service: General;  Laterality: Left;     Outpatient Encounter Medications as of 08/02/2023  Medication Sig   acetaminophen (TYLENOL) 500 MG tablet Take 2 tablets (1,000 mg total) by mouth every 8 (eight) hours.   capecitabine (XELODA) 500 MG tablet TAKE 2 TABLETS TWICE DAILY FOR 7 DAYS ON THEN 7 DAYS OFF. TAKE AFTER MEALS   citalopram (CELEXA) 40 MG tablet TAKE 1 TABLET BY MOUTH EVERY DAY   diphenoxylate-atropine (LOMOTIL) 2.5-0.025 MG tablet Take 1 tablet by mouth 4 (four) times daily as needed for diarrhea or loose stools.   levothyroxine (SYNTHROID) 75 MCG tablet Take 1 tablet (75 mcg total) by mouth daily before breakfast.   lidocaine (XYLOCAINE) 2 % solution Patient: Mix 1part 2% viscous lidocaine, 1part H20. Swallow 10mL of diluted mixture, before  meals and at bedtime, up to QID   Loperamide HCl (IMODIUM PO) Take by mouth as needed.   LORazepam (ATIVAN) 0.5 MG tablet Take 1 tablet (0.5 mg total) by mouth every 6 (six) hours as needed for anxiety (agitation).   Mouthwashes (MOUTH RINSE) LIQD solution 15 mLs by Mouth Rinse route as needed (for oral care).   nicotine (NICODERM CQ - DOSED IN MG/24 HR) 7 mg/24hr patch Place 1 patch (7 mg total) onto the skin daily.   ondansetron (ZOFRAN) 8 MG tablet Take 1 tablet (8 mg total) by mouth every 8 (eight) hours as needed for nausea or vomiting.   OVER THE COUNTER MEDICATION Take 1 tablet by mouth daily. Protandim Supplement   oxyCODONE (OXY IR/ROXICODONE) 5 MG immediate release tablet Take 0.5-1 tablets (2.5-5 mg total) by mouth every 4 (four) hours as needed for moderate pain or severe pain (dyspnea).   pregabalin (LYRICA) 25 MG capsule Take 1 capsule (25 mg total) by mouth 2 (two) times  daily.   senna-docusate (SENOKOT-S) 8.6-50 MG tablet Take 1 tablet by mouth 2 (two) times daily.   tucatinib (TUKYSA) 150 MG tablet Take 2 tablets (300 mg total) by mouth 2 (two) times daily. Take as instructed per MD.   No facility-administered encounter medications on file as of 08/02/2023.     Today's Vitals   08/02/23 0831 08/02/23 0832  BP: 118/60   Pulse: 87   Resp: 17   Temp: 98 F (36.7 C)   TempSrc: Temporal   SpO2: 99%   Weight: 75 lb 3.2 oz (34.1 kg)   PainSc:  0-No pain   Body mass index is 13.75 kg/m.   PHYSICAL EXAM GENERAL:alert, no distress and comfortable SKIN: no rash  EYES: sclera clear LUNGS: clear with normal breathing effort HEART: regular rate & rhythm, no lower extremity edema ABDOMEN: abdomen soft, non-tender and normal bowel sounds NEURO: alert & oriented x 3 with fluent speech    CBC    Component Value Date/Time   WBC 7.6 08/02/2023 0803   WBC 10.3 10/19/2022 0500   RBC 4.35 08/02/2023 0803   HGB 14.7 08/02/2023 0803   HCT 43.0 08/02/2023 0803   PLT 289  08/02/2023 0803   MCV 98.9 08/02/2023 0803   MCH 33.8 08/02/2023 0803   MCHC 34.2 08/02/2023 0803   RDW 15.9 (H) 08/02/2023 0803   LYMPHSABS 0.7 08/02/2023 0803   MONOABS 0.6 08/02/2023 0803   EOSABS 0.1 08/02/2023 0803   BASOSABS 0.0 08/02/2023 0803     CMP     Component Value Date/Time   NA 137 08/02/2023 0803   K 4.0 08/02/2023 0803   CL 102 08/02/2023 0803   CO2 29 08/02/2023 0803   GLUCOSE 122 (H) 08/02/2023 0803   BUN 13 08/02/2023 0803   CREATININE 0.46 08/02/2023 0803   CALCIUM 9.7 08/02/2023 0803   PROT 6.5 08/02/2023 0803   ALBUMIN 4.0 08/02/2023 0803   AST 18 08/02/2023 0803   ALT 17 08/02/2023 0803   ALKPHOS 133 (H) 08/02/2023 0803   BILITOT 0.7 08/02/2023 0803   GFRNONAA >60 08/02/2023 0803   GFRAA >60 05/21/2020 1350     ASSESSMENT & PLAN:Martha [name] is a 62 y.o. female with    Metastatic breast cancer to bone and brain, ER/PR - HER2 + -Diagnosed in 06/2022 -PET  08/03/2022 showed diffuse bone mets  -s/p cervical laminectomy and fusion by Dr. Maisie Fus on 08/17/2022, biopsy confirmed metastatic breast cancer, ER/PR negative, HER2 positive. -due to cervical cord compression with myelopathy, she underwent a second cervical spine surgery on 09/06/2022, and completed inpatient rehabitation. -S/p brain, cervical and lumbar spine radiation in 09/2022 -She started first line systemic chemotherapy with weekly Taxol and trastuzumab/Perjeta on 10/25/22.  -She tolerated well and achieved complete metabolic response on PET, but unfortunately developed diffuse, innumerable new brain metastasis -She met with Dr. Basilio Cairo and is open to whole brain radiation but holding for now -Changed to Second line Xeloda (BID 7 days on/7 days off), Tucatinib (BID) and Trastuzumab (q3 weeks) to penetrate BBB better starting 03/2023 and ontinues zometa q3 months   -PET scan 07/05/2023 stable, showed no recurrent metabolically active metastatic disease.  Continue the current regimen -Ms.  Bow appears stable, tolerating treatment well overall.  Side effects are stable and adequately managed with supportive care at home.  He is able to recover and function and maintain adequate performance status.  There is no clinical evidence of disease progression -Labs reviewed, adequate to continue the current  regimen, Kanjinti today, then 4 weeks off for Christmas.  -Echo next week, restaging MRI in January with f/up with Dr. Barbaraann Cao -F/up with med onc in 4 weeks with next Kanjinti  Malignant neoplasm of lower-inner quadrant of left breast in female, estrogen receptor positive She was diagnosed in 12/2017. She is s/p left breast lumpectomy and adjuvant radiation.  -She started anti-estrogen therapy with letrozole on 05/2018. Tolerating well with no issues but is no longer taking -lost f/u after visit in 04/2020 until her recurrence in 07/2022    Cancer related pain -2/2 bone metastasis, improved after RT -Currently managed on Cymbalta, lyrica, extra strength tylenol, and rare oxycodone  -f/u with palliative care clinic NP Nikki       PLAN: -Labs reviewed, adequate to continue the current regimen -Kanjinti today, then 4 weeks off for Christmas -Palliative care visit today as scheduled -Echo next week, restaging MRI in January with f/up with Dr. Barbaraann Cao 1/7 -F/up with med onc in 4 weeks with next Kanjinti     All questions were answered. The patient knows to call the clinic with any problems, questions or concerns. No barriers to learning were detected.  Santiago Glad, NP-C 08/02/2023

## 2023-08-02 NOTE — Patient Instructions (Signed)
[facility] - A DEPT OF [facility]  Discharge Instructions: Thank you for choosing [facility] to provide your oncology and hematology care.   If you have a lab appointment with the Cancer Center, please go directly to the Cancer Center and check in at the registration area.   Wear comfortable clothing and clothing appropriate for easy access to any Portacath or PICC line.   We strive to give you quality time with your provider. You may need to reschedule your appointment if you arrive late (15 or more minutes).  Arriving late affects you and other patients whose appointments are after yours.  Also, if you miss three or more appointments without notifying the office, you may be dismissed from the clinic at the provider's discretion.      For prescription refill requests, have your pharmacy contact our office and allow 72 hours for refills to be completed.    Today you received the following chemotherapy and/or immunotherapy agents : Trastuzumab      To help prevent nausea and vomiting after your treatment, we encourage you to take your nausea medication as directed.  BELOW ARE SYMPTOMS THAT SHOULD BE REPORTED IMMEDIATELY: *FEVER GREATER THAN 100.4 F (38 C) OR HIGHER *CHILLS OR SWEATING *NAUSEA AND VOMITING THAT IS NOT CONTROLLED WITH YOUR NAUSEA MEDICATION *UNUSUAL SHORTNESS OF BREATH *UNUSUAL BRUISING OR BLEEDING *URINARY PROBLEMS (pain or burning when urinating, or frequent urination) *BOWEL PROBLEMS (unusual diarrhea, constipation, pain near the anus) TENDERNESS IN MOUTH AND THROAT WITH OR WITHOUT PRESENCE OF ULCERS (sore throat, sores in mouth, or a toothache) UNUSUAL RASH, SWELLING OR PAIN  UNUSUAL VAGINAL DISCHARGE OR ITCHING   Items with * indicate a potential emergency and should be followed up as soon as possible or go to the Emergency Department if any problems should occur.  Please show the CHEMOTHERAPY ALERT CARD or  IMMUNOTHERAPY ALERT CARD at check-in to the Emergency Department and triage nurse.  Should you have questions after your visit or need to cancel or reschedule your appointment, please contact [facility] - A DEPT OF [facility]  Dept: [phone]  and follow the prompts.  Office hours are 8:00 a.m. to 4:30 p.m. Monday - Friday. Please note that voicemails left after 4:00 p.m. may not be returned until the following business day.  We are closed weekends and major holidays. You have access to a nurse at all times for urgent questions. Please call the main number to the clinic Dept: [phone] and follow the prompts.   For any non-urgent questions, you may also contact your provider using MyChart. We now offer e-Visits for anyone 24 and older to request care online for non-urgent symptoms. For details visit [email or web address].   Also download the MyChart app! Go to the app store, search "MyChart", open the app, select [facility], and log in with your MyChart username and password.

## 2023-08-03 ENCOUNTER — Encounter: Payer: Self-pay | Admitting: Hematology

## 2023-08-03 ENCOUNTER — Encounter: Payer: Self-pay | Admitting: Nurse Practitioner

## 2023-08-05 ENCOUNTER — Other Ambulatory Visit: Payer: Self-pay | Admitting: Nurse Practitioner

## 2023-08-05 DIAGNOSIS — Z515 Encounter for palliative care: Secondary | ICD-10-CM

## 2023-08-05 DIAGNOSIS — C7951 Secondary malignant neoplasm of bone: Secondary | ICD-10-CM

## 2023-08-05 DIAGNOSIS — F32A Depression, unspecified: Secondary | ICD-10-CM

## 2023-08-05 DIAGNOSIS — M792 Neuralgia and neuritis, unspecified: Secondary | ICD-10-CM

## 2023-08-05 MED ORDER — CAPECITABINE 500 MG PO TABS: ORAL_TABLET | ORAL | 0 refills | Status: DC

## 2023-08-05 MED ORDER — PREGABALIN 25 MG PO CAPS: 25.0000 mg | ORAL_CAPSULE | Freq: Two times a day (BID) | ORAL | 2 refills | Status: DC

## 2023-08-06 ENCOUNTER — Other Ambulatory Visit (HOSPITAL_COMMUNITY): Payer: Self-pay

## 2023-08-08 ENCOUNTER — Other Ambulatory Visit: Payer: Self-pay | Admitting: Nurse Practitioner

## 2023-08-08 DIAGNOSIS — R197 Diarrhea, unspecified: Secondary | ICD-10-CM

## 2023-08-08 DIAGNOSIS — Z515 Encounter for palliative care: Secondary | ICD-10-CM

## 2023-08-08 DIAGNOSIS — C7951 Secondary malignant neoplasm of bone: Secondary | ICD-10-CM

## 2023-08-08 MED ORDER — DIPHENOXYLATE-ATROPINE 2.5-0.025 MG PO TABS: 1.0000 | ORAL_TABLET | Freq: Four times a day (QID) | ORAL | 0 refills | Status: DC | PRN

## 2023-08-09 ENCOUNTER — Ambulatory Visit (HOSPITAL_COMMUNITY)
Admission: RE | Admit: 2023-08-09 | Discharge: 2023-08-09 | Disposition: A | Discharge status: Home / Self Care | Payer: BC Managed Care – PPO | Source: Ambulatory Visit | Attending: Hematology | Admitting: Hematology

## 2023-08-09 DIAGNOSIS — C7951 Secondary malignant neoplasm of bone: Secondary | ICD-10-CM | POA: Diagnosis not present

## 2023-08-09 DIAGNOSIS — Z5181 Encounter for therapeutic drug level monitoring: Secondary | ICD-10-CM | POA: Diagnosis not present

## 2023-08-09 DIAGNOSIS — Z0189 Encounter for other specified special examinations: Secondary | ICD-10-CM

## 2023-08-09 LAB — ECHOCARDIOGRAM COMPLETE
Area-P 1/2: 3.58 cm2
Calc EF: 42.9 %
S' Lateral: 2.5 cm
Single Plane A2C EF: 42 %
Single Plane A4C EF: 42.3 %

## 2023-08-13 ENCOUNTER — Encounter: Payer: Self-pay | Admitting: Nurse Practitioner

## 2023-08-14 ENCOUNTER — Encounter: Payer: Self-pay | Admitting: Hematology

## 2023-08-15 ENCOUNTER — Other Ambulatory Visit: Payer: Self-pay | Admitting: Nurse Practitioner

## 2023-08-15 DIAGNOSIS — C7951 Secondary malignant neoplasm of bone: Secondary | ICD-10-CM

## 2023-08-23 ENCOUNTER — Other Ambulatory Visit: Payer: Self-pay | Admitting: Radiation Therapy

## 2023-08-26 ENCOUNTER — Other Ambulatory Visit: Payer: Self-pay | Admitting: Nurse Practitioner

## 2023-08-26 DIAGNOSIS — C7951 Secondary malignant neoplasm of bone: Secondary | ICD-10-CM

## 2023-08-27 ENCOUNTER — Encounter: Payer: Self-pay | Admitting: Hematology

## 2023-08-27 ENCOUNTER — Other Ambulatory Visit: Payer: Self-pay | Admitting: Hematology

## 2023-08-27 DIAGNOSIS — C7951 Secondary malignant neoplasm of bone: Secondary | ICD-10-CM

## 2023-08-27 MED ORDER — CAPECITABINE 500 MG PO TABS: ORAL_TABLET | ORAL | 0 refills | Status: DC

## 2023-08-30 ENCOUNTER — Other Ambulatory Visit: Payer: Self-pay | Admitting: Family Medicine

## 2023-08-30 ENCOUNTER — Ambulatory Visit (HOSPITAL_COMMUNITY)
Admission: RE | Admit: 2023-08-30 | Discharge: 2023-08-30 | Disposition: A | Discharge status: Home / Self Care | Payer: BC Managed Care – PPO | Source: Ambulatory Visit | Attending: Radiation Oncology | Admitting: Radiation Oncology

## 2023-08-30 ENCOUNTER — Other Ambulatory Visit (HOSPITAL_COMMUNITY): Payer: Self-pay

## 2023-08-30 ENCOUNTER — Inpatient Hospital Stay: Payer: BC Managed Care – PPO

## 2023-08-30 ENCOUNTER — Other Ambulatory Visit: Payer: Self-pay

## 2023-08-30 ENCOUNTER — Inpatient Hospital Stay: Payer: BC Managed Care – PPO | Admitting: Hematology

## 2023-08-30 DIAGNOSIS — C7931 Secondary malignant neoplasm of brain: Secondary | ICD-10-CM | POA: Diagnosis not present

## 2023-08-30 DIAGNOSIS — R220 Localized swelling, mass and lump, head: Secondary | ICD-10-CM | POA: Diagnosis not present

## 2023-08-30 DIAGNOSIS — C801 Malignant (primary) neoplasm, unspecified: Secondary | ICD-10-CM | POA: Diagnosis not present

## 2023-08-30 MED ORDER — GADOBUTROL 1 MMOL/ML IV SOLN
3.0000 mL | Freq: Once | INTRAVENOUS | Status: AC | PRN
  Administered 2023-08-30: 3 mL via INTRAVENOUS

## 2023-09-04 ENCOUNTER — Inpatient Hospital Stay: Payer: BC Managed Care – PPO | Attending: Genetic Counselor | Admitting: Internal Medicine

## 2023-09-04 VITALS — BP 109/52 | HR 84 | Temp 97.7°F | Resp 16 | Ht 62.0 in | Wt 74.3 lb

## 2023-09-04 DIAGNOSIS — C7951 Secondary malignant neoplasm of bone: Secondary | ICD-10-CM | POA: Insufficient documentation

## 2023-09-04 DIAGNOSIS — C7931 Secondary malignant neoplasm of brain: Secondary | ICD-10-CM | POA: Diagnosis not present

## 2023-09-04 DIAGNOSIS — C50312 Malignant neoplasm of lower-inner quadrant of left female breast: Secondary | ICD-10-CM | POA: Insufficient documentation

## 2023-09-04 DIAGNOSIS — C787 Secondary malignant neoplasm of liver and intrahepatic bile duct: Secondary | ICD-10-CM | POA: Diagnosis not present

## 2023-09-04 DIAGNOSIS — Z5112 Encounter for antineoplastic immunotherapy: Secondary | ICD-10-CM | POA: Diagnosis not present

## 2023-09-04 DIAGNOSIS — Z9221 Personal history of antineoplastic chemotherapy: Secondary | ICD-10-CM | POA: Insufficient documentation

## 2023-09-04 DIAGNOSIS — Z79899 Other long term (current) drug therapy: Secondary | ICD-10-CM | POA: Diagnosis not present

## 2023-09-04 DIAGNOSIS — Z170 Estrogen receptor positive status [ER+]: Secondary | ICD-10-CM | POA: Diagnosis not present

## 2023-09-04 DIAGNOSIS — Z923 Personal history of irradiation: Secondary | ICD-10-CM | POA: Diagnosis not present

## 2023-09-04 NOTE — Progress Notes (Signed)
 Palliative Medicine [facility], [facility], [facility]  [phone] [phone]   Name: Martha [name] Date: 09/04/2023 MRN: [id number]  DOB: 1960-12-04  Patient Care Team: [name], MD as PCP - General (Family Medicine) [name], MD as Consulting Physician (General Surgery) [name], MD as Consulting Physician (Hematology) [name], MD as Attending Physician (Radiation Oncology) [name], [name], NP as Nurse Practitioner (Nurse Practitioner)    INTERVAL HISTORY: Martha [name] is a 63 y.o. female with oncologic medical history including ER+ breast cancer (01/2018), right breast cancer (04/2021), now with metastatic disease progression involving brain and liver, pathological fractures with osseous mets s/p brain radiation. .  Palliative ask to see for symptom management and goals of care.   SOCIAL HISTORY:    Martha [name] reports that she has been smoking cigarettes. She has a 20 pack-year smoking history. She has never used smokeless tobacco. She reports that she does not currently use alcohol after a past usage of about 28.0 standard drinks of alcohol per week. She reports that she does not use drugs.  ADVANCE DIRECTIVES:  Has documents on file  CODE STATUS: DNR  PAST MEDICAL HISTORY: Past Medical History:  Diagnosis Date   Anxiety    Cancer ([name]) 2022   right breast LCIS   Cancer ([name]) 2019   left breast   Depression    Dysrhythmia    SVT, s/p ablation ~ 2012 in at [facility]   Ectopic pregnancy    Family history of breast cancer    Family history of melanoma    History of radiation therapy 04/22/18-06/03/18   Left Breast, left SCV, axilla 50 Gy in 25 fractions, Left breast boost 10 Gy in 5 fractions.    Hypothyroidism    Personal history of radiation therapy    PONV (postoperative nausea and vomiting)    Thyroid  disease     ALLERGIES:  is allergic to morphine and codeine.  MEDICATIONS:  Current Outpatient  Medications  Medication Sig Dispense Refill   acetaminophen  (TYLENOL ) 500 MG tablet Take 2 tablets (1,000 mg total) by mouth every 8 (eight) hours. 30 tablet 0   capecitabine  (XELODA ) 500 MG tablet TAKE 2 TABLETS TWICE DAILY FOR 7 DAYS ON THEN 7 DAYS OFF. TAKE AFTER MEALS 56 tablet 0   citalopram  (CELEXA ) 40 MG tablet TAKE 1 TABLET BY MOUTH EVERY DAY 90 tablet 2   diphenoxylate -atropine  (LOMOTIL ) 2.5-0.025 MG tablet Take 1 tablet by mouth 4 (four) times daily as needed for diarrhea or loose stools. 90 tablet 0   levothyroxine  (SYNTHROID ) 75 MCG tablet Take 1 tablet (75 mcg total) by mouth daily before breakfast. 30 tablet 0   lidocaine  (XYLOCAINE ) 2 % solution Patient: Mix 1part 2% viscous lidocaine , 1part H20. Swallow 10mL of diluted mixture, before meals and at bedtime, up to QID 100 mL 3   Loperamide  HCl (IMODIUM  PO) Take by mouth as needed.     LORazepam  (ATIVAN ) 0.5 MG tablet Take 1 tablet (0.5 mg total) by mouth every 6 (six) hours as needed for anxiety (agitation). 30 tablet 0   Mouthwashes (MOUTH RINSE) LIQD solution 15 mLs by Mouth Rinse route as needed (for oral care).  0   nicotine  (NICODERM CQ  - DOSED IN MG/24 HR) 7 mg/24hr patch Place 1 patch (7 mg total) onto the skin daily. 28 patch 0   ondansetron  (ZOFRAN ) 8 MG tablet Take 1 tablet (8 mg total) by mouth every 8 (eight) hours as needed  for nausea or vomiting. 20 tablet 1   OVER THE COUNTER MEDICATION Take 1 tablet by mouth daily. Protandim Supplement     oxyCODONE  (OXY IR/ROXICODONE ) 5 MG immediate release tablet Take 0.5-1 tablets (2.5-5 mg total) by mouth every 4 (four) hours as needed for moderate pain or severe pain (dyspnea). 30 tablet 0   pregabalin  (LYRICA ) 25 MG capsule Take 1 capsule (25 mg total) by mouth 2 (two) times daily. 60 capsule 2   senna-docusate (SENOKOT-S) 8.6-50 MG tablet Take 1 tablet by mouth 2 (two) times daily. 60 tablet 0   tucatinib  (TUKYSA ) 150 MG tablet Take 2 tablets (300 mg total) by mouth 2 (two)  times daily. Take as instructed per MD. 120 tablet 2   No current facility-administered medications for this visit.    VITAL SIGNS: There were no vitals taken for this visit. There were no vitals filed for this visit.  Estimated body mass index is 13.59 kg/m as calculated from the following:   Height as of 09/04/23: 5' 2 (1.575 m).   Weight as of 09/04/23: 74 lb 4.8 oz (33.7 kg).   PERFORMANCE STATUS (ECOG) : 3 - Symptomatic, >50% confined to bed   Physical Exam General: NAD, thin   Cardiovascular: regular rate and rhythm Pulmonary: normal breathing pattern Abdomen: soft, nontender, + bowel sounds Extremities: no edema, no joint deformities Skin: no rashes, muscle wasting  Neurological: alert, oriented x 4  IMPRESSION:  I saw [name] during her infusion. No acute distress. She is doing well overall. Her husband is present. Preparing to move into their new home. The patient, with a history of cancer, reports overall good health and no new symptoms.   [name] shares recent visit with Dr. Buckley and of being considered and classified as a cancer survivor, which has brought them significant joy. They are currently on a regimen of pregabalin  which she tolerates well.   No needs at this time. We will continue to support and follow as needed.  Decreased appetite/weight loss Patient reports improvement in appetite. Current weight stable at 73lbs. She is enjoying eating foods that she enjoys. Drinking daily protein shakes.    Pain Mrs. Bardwell shares pain is minimal. Occasional aches and pains. Lyrica  as prescribed. Tylenol  as needed. Describes pain in sacral and back area. Some stiffness and tenderness to neck area.   No symptom management needs at this time. Will continue to support as needed.   PLAN: Continue intake of Ensure and soft, high-calorie foods. Continue Lyrica  25 mg twice daily. Tylenol  as needed for mild aches and pain.  Protective skin barrier to sacral area. Ongoing  goals of care discussions and symptom management Palliative will plan to see patient back in 4-6 weeks in collaboration to other oncology appointments.  Patient and husband knows to contact office sooner if needed.   Patient expressed understanding and was in agreement with this plan. She also understands that She can call the clinic at any time with any questions, concerns, or complaints.      Any controlled substances utilized were prescribed in the context of palliative care. PDMP has been reviewed.   Visit consisted of counseling and education dealing with the complex and emotionally intense issues of symptom management and palliative care in the setting of serious and potentially life-threatening illness.  Levon Borer, AGPCNP-BC  Palliative Medicine Team/[facility]

## 2023-09-04 NOTE — Progress Notes (Signed)
 [facility] [address]  [place], [place] [address] [phone]   New Patient Evaluation  Date of Service: 09/04/23 Patient Name: Martha [name] Patient MRN: [id number] Patient DOB: 01/18/1961 Provider: Arthea MARLA Manns, MD  Identifying Statement:  Martha [name] is a 63 y.o. female with Secondary cancer of brain [facility]) who presents for initial consultation and evaluation regarding cancer associated neurologic deficits.    Referring Provider: [name], MD [address] [place],  [place] [address]  Primary Cancer:  Oncologic History: Oncology History Overview Note  Cancer Staging Malignant neoplasm of lower-inner quadrant of left breast in female, estrogen receptor positive ([name]) Staging form: Breast, AJCC 8th Edition - Clinical stage from 01/16/2018: Stage IA (cT1c, cN0, cM0, G2, ER+, PR+, HER2-) - Signed by [name], MD on 01/23/2018 - Pathologic: Stage IA (pT1c, pN1, cM0, G1, ER+, PR+, HER2-) - Signed by [name], MD on 04/09/2018     Malignant neoplasm of lower-inner quadrant of left breast in female, estrogen receptor positive ([name])  01/15/2018 Mammogram   Diagnositc Mammogram 01/15/18  IMPRESSION: 1. Suspicious 1.2 x 1.4 x 1.3 cm mixed echogenicity mass left breast 7 o'clock position retroareolar location at the site of palpable concern.. 2. Indeterminate Within the left breast 7:30 o'clock retroareolar location, adjacent to the palpable mass, is a 0.5 x 0.4 x 0.5 cm oval circumscribed hypoechoic mass. 3. Indeterminate calcifications within the lateral left breast. Location of these calcifications is not definitely confirmed on the true lateral view.    01/16/2018 Initial Biopsy   Diagnosis 01/16/18 1. Breast, left, needle core biopsy, 7:30 o'clock (ribbon clip) - FIBROCYSTIC CHANGES WITH SCLEROSING ADENOSIS AND CALCIFICATIONS. - FIBROADENOMATOID CHANGE. - NO MALIGNANCY IDENTIFIED. 2. Breast, left,  needle core biopsy, 7 o'clock position (coil clip) - INVASIVE MAMMARY CARCINOMA, MSBR GRADE I/II. - SEE MICROSCOPIC DESCRIPTION Microscopic Comment  ADDENDUM: Immunohistochemistry for E-Cadherin is strongly positive in the tumor consistent with ductal carcinoma. (JDP:ah 01/17/18)   01/16/2018 Receptors her2   Estrogen Receptor: 100%, POSITIVE, STRONG STAINING INTENSITY Progesterone Receptor: 50%, POSITIVE, STRONG STAINING INTENSITY Proliferation Marker Ki67: 20% HER2 Negative   01/16/2018 Cancer Staging   Staging form: Breast, AJCC 8th Edition - Clinical stage from 01/16/2018: Stage IA (cT1c, cN0, cM0, G2, ER+, PR+, HER2-) - Signed by [name], MD on 01/23/2018   01/22/2018 Initial Diagnosis   Malignant neoplasm of lower-inner quadrant of left breast in female, estrogen receptor positive ([name])   02/21/2018 Surgery    LEFT BREAST LUMPECTOMY WITH AXILLARY LYMPH NODE BIOPSY by Dr. Aron  02/21/18   02/21/2018 Pathology Results   Diagnosis 02/21/18 1. Breast, lumpectomy, Left - INVASIVE DUCTAL CARCINOMA, GRADE I, 1.6 CM. - DUCTAL CARCINOMA IN SITU, INTERMEDIATE NUCLEAR GRADE. - ANTERIOR AND MEDIAL RESECTION MARGINS ARE POSITIVE FOR CARCINOMA. - NEGATIVE FOR LYMPHOVASCULAR OR PERINEURAL INVASION. - BACKGROUND BREAST TISSUE WITH FIBROCYSTIC CHANGE, INCLUDING SCLEROSING ADENOSIS. - BIOPSY SITE CHANGES. - SEE ONCOLOGY TABLE. 2. Lymph node, sentinel, biopsy, Left Axillary #1 - METASTATIC BREAST CARCINOMA TO A LYMPH NODE, 1.0 CM IN GREATEST DIMENSION, WITH EXTRANODAL EXTENSION (1/1). 3. Lymph node, sentinel, biopsy, Left Axillary #2 - LYMPH NODE, NEGATIVE FOR CARCINOMA (0/1).    02/21/2018 Miscellaneous   Mammaprint 02/21/18 Low Risk with 10-year risk of recurrnce at 10% -No potential signifcant chemotherapy benefit   03/20/2018 Pathology Results   RE-EXCISION OF BREAST LUMPECTOMY by Dr. Aron  Diagnosis 03/20/18 1. Breast, excision, Left new anterior margin - FIBROCYSTIC CHANGES WITH  ADENOSIS AND CALCIFICATIONS. - HEALING BIOPSY SITE. - THERE IS NO EVIDENCE OF MALIGNANCY. 2. Breast, excision, Left new medial margin - FIBROCYSTIC CHANGES WITH ADENOSIS AND CALCIFICATIONS. - HEALING BIOPSY SITE. - THERE IS NO EVIDENCE OF MALIGNANCY. Microscopic Comment 1. -2. The surgical resection margin(s) of the specimen were inked and microscopically evaluated. (JBK:kh 03-22-18)   04/09/2018 Cancer Staging   Staging form: Breast, AJCC 8th Edition - Pathologic: Stage IA (pT1c, pN1, cM0, G1, ER+, PR+, HER2-) - Signed by [name], MD on 04/09/2018   04/22/2018 - 06/03/2018 Radiation Therapy   Radaiton with Dr. Izell 04/22/18-06/03/18   05/2018 -  Anti-estrogen oral therapy   Letrozole  2.5mg  started 05/2018    Survivorship   Per Mayme Silversmith, NP    05/04/2022 Imaging    IMPRESSION: Cervical spondylosis, as described.   Nonspecific straightening of the expected cervical lordosis.   07/24/2022 Imaging    IMPRESSION: 1. Extensive osseous metastatic disease with pathologic fracture at the base of dens and C2 right lateral mass. Extraosseous tumor at C1 and C2 likely impinging on the right C2 and C3 nerve roots. 2. Degenerative cord impingement at C4-5 to C6-7. Biforaminal impingement at C5-6 and C6-7.   08/03/2022 PET scan    IMPRESSION: 1. Large volume osseous metastasis. 2. Low right cervical and probable right axillary nodal metastasis. 3. Subtle heterogeneous activity throughout the liver with suggestion of small liver lesions (likely new compared to chest CT of 07/16/2020). Findings are overall moderately suspicious for hepatic metastasis. Pre and post contrast abdominal MRI (preferred) or CT could confirm. 4. Right-sided pleural thickening and trace pleural fluid. Right base airspace disease is favored to represent chronic atelectasis. 5. Aortic atherosclerosis (ICD10-I70.0) and emphysema (ICD10-J43.9).     08/11/2022 Genetic Testing   Negative genetic testing on  the CancerNext-Expanded+RNAinsight panel.  The report date is August 11, 2022.  The CancerNext-Expanded gene panel offered by [facility] and includes sequencing and rearrangement analysis for the following 77 genes: AIP, ALK, APC*, ATM*, AXIN2, BAP1, BARD1, BLM, BMPR1A, BRCA1*, BRCA2*, BRIP1*, CDC73, CDH1*, CDK4, CDKN1B, CDKN2A, CHEK2*, CTNNA1, DICER1, FANCC, FH, FLCN, GALNT12, KIF1B, LZTR1, MAX, MEN1, MET, MLH1*, MSH2*, MSH3, MSH6*, MUTYH*, NBN, NF1*, NF2, NTHL1, PALB2*, PHOX2B, PMS2*, POT1, PRKAR1A, PTCH1, PTEN*, RAD51C*, RAD51D*, RB1, RECQL, RET, SDHA, SDHAF2, SDHB, SDHC, SDHD, SMAD4, SMARCA4, SMARCB1, SMARCE1, STK11, SUFU, TMEM127, TP53*, TSC1, TSC2, VHL and XRCC2 (sequencing and deletion/duplication); EGFR, EGLN1, HOXB13, KIT, MITF, PDGFRA, POLD1, and POLE (sequencing only); EPCAM and GREM1 (deletion/duplication only). DNA and RNA analyses performed for * genes.    Lobular carcinoma in situ (LCIS) of right breast  01/20/2020 Mammogram   Diagnostic Mammogram 01/20/20 IMPRESSION: 1.  Stable post lumpectomy changes of the left breast.   2. Suspicious microcalcifications over the right upper outer quadrant spanning 3.6 cm.   02/02/2020 Initial Biopsy   Diagnosis 02/02/20 Breast, right, needle core biopsy, upper outer quadrant, x clip - LOBULAR CARCINOMA IN SITU WITH PLEOMORPHIC FEATURES AND CALCIFICATIONS, INVOLVING ADENOSIS. SEE NOTE Diagnosis Note Immunohistochemical stain for E-cadherin is negative in the lesional cells, consistent with a lobular phenotype. Immunostains for p63, SMM 1 and calponin do not show evidence of invasive carcinoma.    02/04/2020 Initial Diagnosis   Lobular carcinoma in situ (LCIS) of right breast   03/18/2020 Surgery   RIGHT BREAST LUMPECTOMY WITH RADIOACTIVE SEED LOCALIZATION by Dr Marla    03/18/2020 Pathology Results   FINAL MICROSCOPIC DIAGNOSIS:   A. BREAST, RIGHT, LUMPECTOMY:  - Pleomorphic lobular carcinoma in situ with calcifications  and  underlying  complex sclerosing lesion, adenosis and fibroadenomatoid  change.  - Margins of resection are not involved (Closest margins: < 1 mm,  anterior, posterior, inferior and medial).  - Biopsy site.    COMMENT:   P63, Calponin and SMM-1 demonstrate the presence of myoepithelium in the  select focus.    Metastatic breast cancer to bone and brain  05/04/2022 Imaging    IMPRESSION: Cervical spondylosis, as described.   Nonspecific straightening of the expected cervical lordosis.   07/24/2022 Imaging    IMPRESSION: 1. Extensive osseous metastatic disease with pathologic fracture at the base of dens and C2 right lateral mass. Extraosseous tumor at C1 and C2 likely impinging on the right C2 and C3 nerve roots. 2. Degenerative cord impingement at C4-5 to C6-7. Biforaminal impingement at C5-6 and C6-7.   08/02/2022 Initial Diagnosis   Cancer, metastatic to bone ([name])   08/03/2022 PET scan    IMPRESSION: 1. Large volume osseous metastasis. 2. Low right cervical and probable right axillary nodal metastasis. 3. Subtle heterogeneous activity throughout the liver with suggestion of small liver lesions (likely new compared to chest CT of 07/16/2020). Findings are overall moderately suspicious for hepatic metastasis. Pre and post contrast abdominal MRI (preferred) or CT could confirm. 4. Right-sided pleural thickening and trace pleural fluid. Right base airspace disease is favored to represent chronic atelectasis. 5. Aortic atherosclerosis (ICD10-I70.0) and emphysema (ICD10-J43.9).     10/26/2022 - 04/12/2023 Chemotherapy   Patient is on Treatment Plan : BREAST Paclitaxel  D1,8,15 + Trastuzumab  D1 + Pertuzumab  D1 q21d x 8 cycles / Trastuzumab  D1 + Pertuzumab  D1 q21d x 4 cycles     05/03/2023 -  Chemotherapy   Patient is on Treatment Plan : BREAST MAINTENANCE Trastuzumab  IV (6) or SQ (600) D1 q21d X 11 Cycles      CNS Oncologic History 09/20/22: SRSx22 with Dr. Izell   History of Present  Illness: The patient's records from the referring physician were obtained and reviewed and the patient interviewed to confirm this HPI.  Martha [name] presents today for follow up after recent MRI brain.  She describes no new or progressive neurologic deficits.  Gait is now independent.  Still experiences moderate pain in neck and lower back.  Continues to follow with Dr. Lanny for breast cancer, is taking   Medications: Current Outpatient Medications on File Prior to Visit  Medication Sig Dispense Refill   acetaminophen  (TYLENOL ) 500 MG tablet Take 2 tablets (1,000 mg total) by mouth every 8 (eight) hours. 30 tablet 0   capecitabine  (XELODA ) 500 MG tablet TAKE 2 TABLETS TWICE DAILY FOR 7 DAYS ON THEN 7 DAYS OFF. TAKE AFTER MEALS 56 tablet 0   citalopram  (CELEXA ) 40 MG tablet TAKE 1 TABLET BY MOUTH EVERY DAY 90 tablet 2   diphenoxylate -atropine  (LOMOTIL ) 2.5-0.025 MG tablet Take 1 tablet by mouth 4 (four) times daily as needed for diarrhea or loose stools. 90 tablet 0   levothyroxine  (SYNTHROID ) 75 MCG tablet Take 1 tablet (75 mcg total) by mouth daily before breakfast. 30 tablet 0   lidocaine  (XYLOCAINE ) 2 % solution Patient: Mix 1part 2% viscous lidocaine , 1part H20. Swallow 10mL of diluted mixture, before meals and at bedtime, up to QID 100 mL 3   Loperamide  HCl (IMODIUM  PO) Take by mouth as needed.     LORazepam  (ATIVAN ) 0.5 MG tablet Take 1 tablet (0.5 mg total) by mouth every 6 (six) hours as needed for anxiety (agitation). 30 tablet 0  Mouthwashes (MOUTH RINSE) LIQD solution 15 mLs by Mouth Rinse route as needed (for oral care).  0   nicotine  (NICODERM CQ  - DOSED IN MG/24 HR) 7 mg/24hr patch Place 1 patch (7 mg total) onto the skin daily. 28 patch 0   ondansetron  (ZOFRAN ) 8 MG tablet Take 1 tablet (8 mg total) by mouth every 8 (eight) hours as needed for nausea or vomiting. 20 tablet 1   OVER THE COUNTER MEDICATION Take 1 tablet by mouth daily. Protandim Supplement      oxyCODONE  (OXY IR/ROXICODONE ) 5 MG immediate release tablet Take 0.5-1 tablets (2.5-5 mg total) by mouth every 4 (four) hours as needed for moderate pain or severe pain (dyspnea). 30 tablet 0   pregabalin  (LYRICA ) 25 MG capsule Take 1 capsule (25 mg total) by mouth 2 (two) times daily. 60 capsule 2   senna-docusate (SENOKOT-S) 8.6-50 MG tablet Take 1 tablet by mouth 2 (two) times daily. 60 tablet 0   tucatinib  (TUKYSA ) 150 MG tablet Take 2 tablets (300 mg total) by mouth 2 (two) times daily. Take as instructed per MD. 120 tablet 2   No current facility-administered medications on file prior to visit.    Allergies:  Allergies  Allergen Reactions   Morphine And Codeine Other (See Comments)    Makes her angry   Past Medical History:  Past Medical History:  Diagnosis Date   Anxiety    Cancer ([name]) 2022   right breast LCIS   Cancer ([name]) 2019   left breast   Depression    Dysrhythmia    SVT, s/p ablation ~ 2012 in [facility]   Ectopic pregnancy    Family history of breast cancer    Family history of melanoma    History of radiation therapy 04/22/18-06/03/18   Left Breast, left SCV, axilla 50 Gy in 25 fractions, Left breast boost 10 Gy in 5 fractions.    Hypothyroidism    Personal history of radiation therapy    PONV (postoperative nausea and vomiting)    Thyroid  disease    Past Surgical History:  Past Surgical History:  Procedure Laterality Date   APPENDECTOMY     BILATERAL SALPINGECTOMY     BREAST BIOPSY Right 2014   fibroadenoma   BREAST BIOPSY Left 01/16/2018   BREAST BIOPSY Right 02/02/2020   LCIS   BREAST BIOPSY Right 08/04/2020   x2 LCIS   BREAST BIOPSY Right 08/13/2020   x2   BREAST EXCISIONAL BIOPSY Left 1990   benign   BREAST EXCISIONAL BIOPSY Right 02/02/2020   LCIS   BREAST LUMPECTOMY Left 01/22/2018   BREAST LUMPECTOMY WITH AXILLARY LYMPH NODE BIOPSY Left 02/21/2018   Procedure: LEFT BREAST LUMPECTOMY WITH AXILLARY LYMPH NODE BIOPSY;   Surgeon: [name], MD;  Location: [facility] OR;  Service: General;  Laterality: Left;   BREAST LUMPECTOMY WITH RADIOACTIVE SEED LOCALIZATION Right 03/18/2020   Procedure: RIGHT BREAST LUMPECTOMY WITH RADIOACTIVE SEED LOCALIZATION;  Surgeon: [name], MD;  Location: [facility] OR;  Service: General;  Laterality: Right;   BREAST SURGERY Left 1993   cyst removed   ENDOMETRIAL ABLATION     EYE SURGERY     GLAUCOMA SURGERY Bilateral    IR IMAGING GUIDED PORT INSERTION  10/16/2022   POSTERIOR CERVICAL FUSION/FORAMINOTOMY N/A 08/17/2022   Procedure: Cervical One-Cervical Four POSTERIOR CERVICAL FUSION,  Cervical One LAMINECTOMY REDUCTION OF Cervical Two Fracture, Biopsy of Right Cerical Two Pars  Lesion;  Surgeon: Debby Dorn MATSU, MD;  Location: [facility] OR;  Service: Neurosurgery;  Laterality: N/A;   POSTERIOR CERVICAL FUSION/FORAMINOTOMY N/A 09/06/2022   Procedure: Posterior Cervical Fusion, Foraminotomy , Cervical Five-Six, Cervical Six-Seven; Extension of  fusion Cervical Four- Thoracic One;  Surgeon: Debby Dorn MATSU, MD;  Location: [facility];  Service: Neurosurgery;  Laterality: N/A;   RADIOACTIVE SEED GUIDED EXCISIONAL BREAST BIOPSY Right 04/28/2021   Procedure: RADIOACTIVE SEED GUIDED EXCISIONAL RIGHT BREAST BIOPSY X2;  Surgeon: [name], MD;  Location: [facility];  Service: General;  Laterality: Right;   RE-EXCISION OF BREAST LUMPECTOMY Left 03/20/2018   Procedure: RE-EXCISION OF BREAST LUMPECTOMY;  Surgeon: [name], MD;  Location:  SURGERY CENTER;  Service: General;  Laterality: Left;   Social History:  Social History   Socioeconomic History   Marital status: Married    Spouse name: Not on file   Number of children: 0   Years of education: Not on file   Highest education level: Not on file  Occupational History   Not on file  Tobacco Use   Smoking status: Every Day    Current packs/day: 0.50    Average packs/day: 0.5 packs/day for 40.0 years (20.0 ttl  pk-yrs)    Types: Cigarettes   Smokeless tobacco: Never  Vaping Use   Vaping status: Former   Quit date: 09/27/2013  Substance and Sexual Activity   Alcohol use: Not Currently    Alcohol/week: 28.0 standard drinks of alcohol    Types: 28 Cans of beer per week    Comment: drinks 4 beers daily   Drug use: No   Sexual activity: Yes    Birth control/protection: Post-menopausal    Comment: endometrial ablasion  Other Topics Concern   Not on file  Social History Narrative   1 stepdaughter   Social Drivers of Health   Financial Resource Strain: Not on file  Food Insecurity: No Food Insecurity (10/23/2022)   Hunger Vital Sign    Worried About Running Out of Food in the Last Year: Never true    Ran Out of Food in the Last Year: Never true  Transportation Needs: No Transportation Needs (10/23/2022)   PRAPARE - Transportation    Lack of Transportation (Medical): No    Lack of Transportation (Non-Medical): No  Physical Activity: Not on file  Stress: Not on file  Social Connections: Not on file  Intimate Partner Violence: Not At Risk (09/04/2022)   Humiliation, Afraid, Rape, and Kick questionnaire    Fear of Current or Ex-Partner: No    Emotionally Abused: No    Physically Abused: No    Sexually Abused: No   Family History:  Family History  Problem Relation Age of Onset   Heart disease Mother    Pneumonia Father    Hyperlipidemia Sister    Melanoma Sister 23       hx of 5 melanomas   Lung cancer Maternal Uncle        mesothelioma   Pneumonia Maternal Uncle    Dementia Paternal Grandmother    Lung disease Paternal Grandfather    Breast cancer Other        MGMs sister dx < 50   Breast cancer Other        PGFs sister dx < 50   Breast cancer Cousin        mother's mat first cousin, dx < 50    Review of Systems: Constitutional: Doesn't report fevers, chills or abnormal weight loss Eyes: Doesn't report blurriness of vision Ears, nose, mouth, throat, and face:  Doesn't report  sore throat Respiratory: Doesn't report cough, dyspnea or wheezes Cardiovascular: Doesn't report palpitation, chest discomfort  Gastrointestinal:  Doesn't report nausea, constipation, diarrhea GU: Doesn't report incontinence Skin: Doesn't report skin rashes Neurological: Per HPI Musculoskeletal: Doesn't report joint pain Behavioral/Psych: Doesn't report anxiety  Physical Exam: Vitals:   09/04/23 0904  BP: (!) 109/52  Pulse: 84  Resp: 16  Temp: 97.7 F (36.5 C)  SpO2: 98%   KPS: 90. General: Alert, cooperative, pleasant, in no acute distress Head: Normal EENT: No conjunctival injection or scleral icterus.  Lungs: Resp effort normal Cardiac: Regular rate Abdomen: Non-distended abdomen Skin: No rashes cyanosis or petechiae. Extremities: No clubbing or edema  Neurologic Exam: Mental Status: Awake, alert, attentive to examiner. Oriented to self and environment. Language is fluent with intact comprehension.  Cranial Nerves: Visual acuity is grossly normal. Visual fields are full. Extra-ocular movements intact. No ptosis. Face is symmetric Motor: Tone and bulk are normal. Power is full in both arms and legs. Reflexes are symmetric, no pathologic reflexes present.  Sensory: Intact to light touch Gait: Normal.   Labs: I have reviewed the data as listed    Component Value Date/Time   NA 137 08/02/2023 0803   K 4.0 08/02/2023 0803   CL 102 08/02/2023 0803   CO2 29 08/02/2023 0803   GLUCOSE 122 (H) 08/02/2023 0803   BUN 13 08/02/2023 0803   CREATININE 0.46 08/02/2023 0803   CALCIUM 9.7 08/02/2023 0803   PROT 6.5 08/02/2023 0803   ALBUMIN 4.0 08/02/2023 0803   AST 18 08/02/2023 0803   ALT 17 08/02/2023 0803   ALKPHOS 133 (H) 08/02/2023 0803   BILITOT 0.7 08/02/2023 0803   GFRNONAA >60 08/02/2023 0803   GFRAA >60 05/21/2020 1350   Lab Results  Component Value Date   WBC 7.6 08/02/2023   NEUTROABS 6.1 08/02/2023   HGB 14.7 08/02/2023   HCT 43.0 08/02/2023   MCV 98.9  08/02/2023   PLT 289 08/02/2023    Imaging:  MR Brain W Wo Contrast Result Date: 08/30/2023 CLINICAL DATA:  Brain metastases, assess treatment response 3T SRS Protocol EXAM: MRI HEAD WITHOUT AND WITH CONTRAST TECHNIQUE: Multiplanar, multiecho pulse sequences of the brain and surrounding structures were obtained without and with intravenous contrast. CONTRAST:  3mL GADAVIST  GADOBUTROL  1 MMOL/ML IV SOLN COMPARISON:  MRI head May 30, 2023. FINDINGS: Brain: Unchanged size of a left CP angle enhancing mass which measures 7 mm on series 100, image 127. Overall, decreased number and conspicuity of punctate enhancing lesions in the brain. A few lesions are not substantially changed such as index 3 mm lesion in the left temporal lobe (series 1100, image 170) and a 2 mm lesion the right parietal lobe (image 199). No evidence of acute infarct, acute hemorrhage, midline shift or hydrocephalus. Vascular: Major arterial flow voids are maintained. Skull and upper cervical spine: Partially imaged cervical fusion. Similar diffusely heterogeneous bone marrow and T1 hypointensity in the clivus, compatible with bony metastatic disease. Sinuses/Orbits: Clear sinuses.  No acute orbital findings. Other: Trace right mastoid effusion. IMPRESSION: Overall, decreased number and conspicuity of punctate metastases, compatible with treatment response. A few lesions are not substantially changed. Electronically Signed   By: Gilmore GORMAN Molt M.D.   On: 08/30/2023 19:36   ECHOCARDIOGRAM COMPLETE Result Date: 08/09/2023    ECHOCARDIOGRAM REPORT   Patient Name:   Martha [name] Date of Exam: 08/09/2023 Medical Rec #:  [id number]  Height:       62.0 in Accession #:    [id number]            Weight:       75.2 lb Date of Birth:  06/29/1961             BSA:          1.261 m Patient Age:    62 years              BP:           110/71 mmHg Patient Gender: F                     HR:           81 bpm. Exam Location:  Outpatient  Procedure: 2D Echo, 3D Echo, Cardiac Doppler, Color Doppler and Strain Analysis Indications:    Chemo  History:        Patient has prior history of Echocardiogram examinations, most                 recent 05/14/2023.  Sonographer:    Tinnie Gosling RDCS Referring Phys: [id number] ONITA MATTOCK IMPRESSIONS  1. Left ventricular ejection fraction, by estimation, is 55 to 60%. The left ventricle has normal function. The left ventricle has no regional wall motion abnormalities. Left ventricular diastolic parameters are indeterminate.  2. Right ventricular systolic function is normal. The right ventricular size is normal.  3. The mitral valve is normal in structure. No evidence of mitral valve regurgitation. No evidence of mitral stenosis.  4. The aortic valve is normal in structure. Aortic valve regurgitation is not visualized. No aortic stenosis is present.  5. The inferior vena cava is normal in size with greater than 50% respiratory variability, suggesting right atrial pressure of 3 mmHg. FINDINGS  Left Ventricle: Left ventricular ejection fraction, by estimation, is 55 to 60%. The left ventricle has normal function. The left ventricle has no regional wall motion abnormalities. Global longitudinal strain performed but not reported based on interpreter judgement due to suboptimal tracking. The left ventricular internal cavity size was normal in size. There is no left ventricular hypertrophy. Left ventricular diastolic parameters are indeterminate. Right Ventricle: The right ventricular size is normal. No increase in right ventricular wall thickness. Right ventricular systolic function is normal. Left Atrium: Left atrial size was normal in size. Right Atrium: Right atrial size was normal in size. Pericardium: There is no evidence of pericardial effusion. Mitral Valve: The mitral valve is normal in structure. No evidence of mitral valve regurgitation. No evidence of mitral valve stenosis. Tricuspid Valve: The tricuspid valve is  normal in structure. Tricuspid valve regurgitation is not demonstrated. No evidence of tricuspid stenosis. Aortic Valve: The aortic valve is normal in structure. Aortic valve regurgitation is not visualized. No aortic stenosis is present. Pulmonic Valve: The pulmonic valve was normal in structure. Pulmonic valve regurgitation is not visualized. No evidence of pulmonic stenosis. Aorta: The aortic root is normal in size and structure. Venous: The inferior vena cava is normal in size with greater than 50% respiratory variability, suggesting right atrial pressure of 3 mmHg. IAS/Shunts: No atrial level shunt detected by color flow Doppler.  LEFT VENTRICLE PLAX 2D LVIDd:         3.80 cm     Diastology LVIDs:         2.50 cm     LV e' medial:    5.33 cm/s LV PW:  0.80 cm     LV E/e' medial:  10.8 LV IVS:        0.80 cm     LV e' lateral:   7.83 cm/s LVOT diam:     1.80 cm     LV E/e' lateral: 7.3 LV SV:         37 LV SV Index:   30 LVOT Area:     2.54 cm  LV Volumes (MOD) LV vol d, MOD A2C: 66.5 ml LV vol d, MOD A4C: 59.1 ml LV vol s, MOD A2C: 38.6 ml LV vol s, MOD A4C: 34.1 ml LV SV MOD A2C:     27.9 ml LV SV MOD A4C:     59.1 ml LV SV MOD BP:      29.2 ml RIGHT VENTRICLE            IVC RV S prime:     7.72 cm/s  IVC diam: 1.40 cm LEFT ATRIUM         Index LA diam:    2.30 cm 1.82 cm/m  AORTIC VALVE LVOT Vmax:   73.10 cm/s LVOT Vmean:  45.000 cm/s LVOT VTI:    0.147 m  AORTA Ao Root diam: 2.80 cm MITRAL VALVE MV Area (PHT): 3.58 cm    SHUNTS MV Decel Time: 212 msec    Systemic VTI:  0.15 m MV E velocity: 57.40 cm/s  Systemic Diam: 1.80 cm MV A velocity: 58.30 cm/s MV E/A ratio:  0.98 Kardie Tobb DO Electronically signed by Dub Huntsman DO Signature Date/Time: 08/09/2023/2:40:25 PM    Final     [facility] Clinician Interpretation: I have personally reviewed the radiological images as listed.  My interpretation, in the context of the patient's clinical presentation, is stable disease   Assessment/Plan Secondary  cancer of brain ([name])  Manjot Beumer is clinically stable today.  MRI brain demonstrates stable findings.  Recommended continued imaging surveillance.  We spent twenty additional minutes teaching regarding the natural history, biology, and historical experience in the treatment of neurologic complications of cancer.   We appreciate the opportunity to participate in the care of Shakiah Wester.   We ask that Lainee Lehrman return to clinic in 4 months following next brain MRI, or sooner as needed.  All questions were answered. The patient knows to call the clinic with any problems, questions or concerns. No barriers to learning were detected.  The total time spent in the encounter was 40 minutes and more than 50% was on counseling and review of test results   Arthea MARLA Manns, MD Medical Director of Neuro-Oncology [facility] 09/04/23 8:59 AM

## 2023-09-05 ENCOUNTER — Telehealth: Payer: BC Managed Care – PPO

## 2023-09-05 ENCOUNTER — Telehealth: Payer: Self-pay | Admitting: Hematology

## 2023-09-05 NOTE — Assessment & Plan Note (Signed)
 She was diagnosed in 12/2017. ER+/PR+/HER2- -She is s/p left breast lumpectomy and adjuvant radiation.  -She started anti-estrogen therapy with letrozole on 05/2018. Tolerating well with no issues -lost f/u after visit in 04/2020 until her recurrence in 07/2022

## 2023-09-05 NOTE — Assessment & Plan Note (Signed)
-  Diagnosed in 06/2022, ER-/PR-/HER2+ --PET scan from 08/03/2022 showed diffuse bone mets  -she underwent cervical laminectomy and fusion by Dr. Maisie Fus on 08/17/2022, biopsy confirmed metastatic breast cancer, ER/PR negative, HER2 positive. -due to cervical cord compression with myelopathy, she underwent a second cervical spine surgery on September 06, 2022, and completed inpatient rehabitation. -She has completed brain, cervical and lumbar spine radiation in Feb 2024 -She was recently hospitalized for altered mental status and confusion, probably secondary to medication -She started first line systemic chemotherapy with weekly Taxol and trastuzumab/Perjeta on 10/25/22. She has tolerated well so far and her pain has improved overall -PET scan from Jan 01, 2023 showed complete metabolic response, no hypermetabolic uptake in diffuse bone lesions, no other new lesions.  She is clinically also doing much better.  Given her excellent response to chemo, we will change her Taxol to 2 weeks on and 1 week off -She Unfortunately developed more more brain metastasis on recent brain MRI. -I personally reviewed her restaging PET scan from April 13, 2023, which showed stable disease, no new lesions. -Due to her new brain metastasis, I changeD her email to Tucatinib, capecitabine and trastuzumab, for better brain penetration. She started in late August 2024, tolerating well so far.  Her case was reviewed in CNS tumor board, Dr. Basilio Cairo plan to hold on whole brain radiation since she is starting new Her2 antibody and monitor her brain mets closely.  -she is also on Zometa every 3 months

## 2023-09-05 NOTE — Telephone Encounter (Signed)
 Martha [name]

## 2023-09-06 ENCOUNTER — Encounter: Payer: Self-pay | Admitting: Hematology

## 2023-09-06 ENCOUNTER — Inpatient Hospital Stay (HOSPITAL_BASED_OUTPATIENT_CLINIC_OR_DEPARTMENT_OTHER): Payer: BC Managed Care – PPO | Admitting: Hematology

## 2023-09-06 ENCOUNTER — Inpatient Hospital Stay: Payer: BC Managed Care – PPO

## 2023-09-06 ENCOUNTER — Inpatient Hospital Stay (HOSPITAL_BASED_OUTPATIENT_CLINIC_OR_DEPARTMENT_OTHER): Payer: BC Managed Care – PPO | Admitting: Nurse Practitioner

## 2023-09-06 VITALS — BP 131/71 | HR 83 | Temp 98.2°F | Resp 15 | Wt 73.9 lb

## 2023-09-06 DIAGNOSIS — C50312 Malignant neoplasm of lower-inner quadrant of left female breast: Secondary | ICD-10-CM

## 2023-09-06 DIAGNOSIS — Z5112 Encounter for antineoplastic immunotherapy: Secondary | ICD-10-CM | POA: Diagnosis not present

## 2023-09-06 DIAGNOSIS — Z515 Encounter for palliative care: Secondary | ICD-10-CM | POA: Diagnosis not present

## 2023-09-06 DIAGNOSIS — C799 Secondary malignant neoplasm of unspecified site: Secondary | ICD-10-CM

## 2023-09-06 DIAGNOSIS — C7951 Secondary malignant neoplasm of bone: Secondary | ICD-10-CM | POA: Diagnosis not present

## 2023-09-06 DIAGNOSIS — G893 Neoplasm related pain (acute) (chronic): Secondary | ICD-10-CM

## 2023-09-06 DIAGNOSIS — Z923 Personal history of irradiation: Secondary | ICD-10-CM | POA: Diagnosis not present

## 2023-09-06 DIAGNOSIS — C7931 Secondary malignant neoplasm of brain: Secondary | ICD-10-CM | POA: Diagnosis not present

## 2023-09-06 DIAGNOSIS — C787 Secondary malignant neoplasm of liver and intrahepatic bile duct: Secondary | ICD-10-CM | POA: Diagnosis not present

## 2023-09-06 DIAGNOSIS — Z170 Estrogen receptor positive status [ER+]: Secondary | ICD-10-CM

## 2023-09-06 DIAGNOSIS — Z9221 Personal history of antineoplastic chemotherapy: Secondary | ICD-10-CM | POA: Diagnosis not present

## 2023-09-06 DIAGNOSIS — Z79899 Other long term (current) drug therapy: Secondary | ICD-10-CM | POA: Diagnosis not present

## 2023-09-06 LAB — CMP (CANCER CENTER ONLY)
ALT: 13 U/L (ref 0–44)
AST: 20 U/L (ref 15–41)
Albumin: 4.1 g/dL (ref 3.5–5.0)
Alkaline Phosphatase: 112 U/L (ref 38–126)
Anion gap: 5 (ref 5–15)
BUN: 11 mg/dL (ref 8–23)
CO2: 31 mmol/L (ref 22–32)
Calcium: 9.3 mg/dL (ref 8.9–10.3)
Chloride: 101 mmol/L (ref 98–111)
Creatinine: 0.48 mg/dL (ref 0.44–1.00)
GFR, Estimated: >60 mL/min (ref >60)
Glucose, Bld: 121 mg/dL — ABNORMAL HIGH (ref 70–99)
Potassium: 3.9 mmol/L (ref 3.5–5.1)
Sodium: 137 mmol/L (ref 135–145)
Total Bilirubin: 0.8 mg/dL (ref 0.0–1.2)
Total Protein: 6.7 g/dL (ref 6.5–8.1)

## 2023-09-06 LAB — CBC WITH DIFFERENTIAL (CANCER CENTER ONLY)
Abs Immature Granulocytes: 0.01 10*3/uL (ref 0.00–0.07)
Basophils Absolute: 0 10*3/uL (ref 0.0–0.1)
Basophils Relative: 1 %
Eosinophils Absolute: 0 10*3/uL (ref 0.0–0.5)
Eosinophils Relative: 1 %
HCT: 40.5 % (ref 36.0–46.0)
Hemoglobin: 14.2 g/dL (ref 12.0–15.0)
Immature Granulocytes: 0 %
Lymphocytes Relative: 10 %
Lymphs Abs: 0.6 10*3/uL — ABNORMAL LOW (ref 0.7–4.0)
MCH: 34.4 pg — ABNORMAL HIGH (ref 26.0–34.0)
MCHC: 35.1 g/dL (ref 30.0–36.0)
MCV: 98.1 fL (ref 80.0–100.0)
Monocytes Absolute: 0.5 10*3/uL (ref 0.1–1.0)
Monocytes Relative: 8 %
Neutro Abs: 5.2 10*3/uL (ref 1.7–7.7)
Neutrophils Relative %: 80 %
Platelet Count: 232 10*3/uL (ref 150–400)
RBC: 4.13 MIL/uL (ref 3.87–5.11)
RDW: 15.8 % — ABNORMAL HIGH (ref 11.5–15.5)
WBC Count: 6.3 10*3/uL (ref 4.0–10.5)
nRBC: 0 % (ref 0.0–0.2)

## 2023-09-06 MED ORDER — SODIUM CHLORIDE 0.9 % IV SOLN: Freq: Once | INTRAVENOUS | Status: AC

## 2023-09-06 MED ORDER — DIPHENHYDRAMINE HCL 25 MG PO CAPS
50.0000 mg | ORAL_CAPSULE | Freq: Once | ORAL | Status: AC
  Administered 2023-09-06: 50 mg via ORAL
  Filled 2023-09-06: qty 2

## 2023-09-06 MED ORDER — SODIUM CHLORIDE 0.9% FLUSH
10.0000 mL | Freq: Once | INTRAVENOUS | Status: AC
Start: 2023-09-06 — End: 2023-09-06
  Administered 2023-09-06: 10 mL via INTRAVENOUS

## 2023-09-06 MED ORDER — SODIUM CHLORIDE 0.9% FLUSH
10.0000 mL | INTRAVENOUS | Status: DC | PRN
  Administered 2023-09-06: 10 mL

## 2023-09-06 MED ORDER — TRASTUZUMAB-ANNS CHEMO 150 MG IV SOLR
8.0000 mg/kg | Freq: Once | INTRAVENOUS | Status: AC
  Administered 2023-09-06: 300 mg via INTRAVENOUS
  Filled 2023-09-06: qty 14.29

## 2023-09-06 MED ORDER — ACETAMINOPHEN 325 MG PO TABS
650.0000 mg | ORAL_TABLET | Freq: Once | ORAL | Status: AC
  Administered 2023-09-06: 650 mg via ORAL
  Filled 2023-09-06: qty 2

## 2023-09-06 MED ORDER — HEPARIN SOD (PORK) LOCK FLUSH 100 UNIT/ML IV SOLN
500.0000 [IU] | Freq: Once | INTRAVENOUS | Status: AC | PRN
  Administered 2023-09-06: 500 [IU]

## 2023-09-06 NOTE — Patient Instructions (Signed)

## 2023-09-06 NOTE — Progress Notes (Signed)
 [facility]   [phone] Fax:([phone]   Clinic Follow up Note   Patient Care Team: [name], MD as PCP - General (Family Medicine) [name], MD as Consulting Physician (General Surgery) [name], MD as Consulting Physician (Hematology) [name], MD as Attending Physician (Radiation Oncology) [name], [name], NP as Nurse Practitioner (Nurse Practitioner)  Date of Service:  09/06/2023  CHIEF COMPLAINT: f/u of metastatic breast cancer  CURRENT THERAPY:  Trastuzumab  every 3 weeks, Xeloda  and Tucatinib   Oncology History   Malignant neoplasm of lower-inner quadrant of left breast in female, estrogen receptor positive ([name]) She was diagnosed in 12/2017. ER+/PR+/HER2- -She is s/p left breast lumpectomy and adjuvant radiation.  -She started anti-estrogen therapy with letrozole  on 05/2018. Tolerating well with no issues -lost f/u after visit in 04/2020 until her recurrence in 07/2022     Metastatic breast cancer to bone and brain -Diagnosed in 06/2022, ER-/PR-/HER2+ --PET scan from 08/03/2022 showed diffuse bone mets  -she underwent cervical laminectomy and fusion by Dr. Debby on 08/17/2022, biopsy confirmed metastatic breast cancer, ER/PR negative, HER2 positive. -due to cervical cord compression with myelopathy, she underwent a second cervical spine surgery on September 06, 2022, and completed inpatient rehabitation. -She has completed brain, cervical and lumbar spine radiation in Feb 2024 -She was recently hospitalized for altered mental status and confusion, probably secondary to medication -She started first line systemic chemotherapy with weekly Taxol  and trastuzumab /Perjeta  on 10/25/22. She has tolerated well so far and her pain has improved overall -PET scan from Jan 01, 2023 showed complete metabolic response, no hypermetabolic uptake in diffuse bone lesions, no other new lesions.  She is clinically also doing much better.  Given her  excellent response to chemo, we will change her Taxol  to 2 weeks on and 1 week off -She Unfortunately developed more more brain metastasis on recent brain MRI. -I personally reviewed her restaging PET scan from April 13, 2023, which showed stable disease, no new lesions. -Due to her new brain metastasis, I changeD her email to Tucatinib , capecitabine  and trastuzumab , for better brain penetration. She started in late August 2024, tolerating well so far.  Her case was reviewed in CNS tumor board, Dr. Izell plan to hold on whole brain radiation since she is starting new Her2 antibody and monitor her brain mets closely.  -she is also on Zometa  every 3 months     Assessment and Plan    Metastatic Breast Cancer Follow-up for metastatic breast cancer. Reports increased energy and daily activities. Weight stable at 74 kg. On Xeloda  (capecitabine ) and trastuzumab . Experiences chemotherapy-induced diarrhea every other day. No nausea or skin issues. Manageable back pain. Normal blood counts. Last PET scan in November was good. Next PET scan in March, MRI on May 1st. Discussed risks and benefits of current treatment, including gastrointestinal side effects and weight maintenance. Adjusted trastuzumab  dose due to delay. -Recent brain MRI showed good response to treatment, will continue current regimen - Continue Xeloda  (capecitabine ) 2 tablets twice a day with 7 days off - Proceed with trastuzumab  infusion today and continue every three weeks - Schedule PET scan for March - MRI scheduled for May 1st - Follow-up with Dr. Fazlow on May 6th - Use lidocaine  cream for back pain - Encourage use of a pillow for back support  Diarrhea Chemotherapy-induced diarrhea every other day. Managing with medication taken three times a day. Emphasized hydration and monitoring for dehydration. - Continue current medication for diarrhea three times  a day  General Health Maintenance Maintaining a bland diet with carbs and  protein to manage gastrointestinal symptoms. Challenged with weight gain. Discussed nutrient-dense foods and frequent, larger portions to aid in weight gain. - Encourage eating larger portions more frequently - Focus on nutrient-dense foods to aid in weight gain  PLAN -Lab reviewed, adequate for treatment, will proceed with trastuzumab  today and continue every 3 weeks - Follow-up in three weeks for next trastuzumab  infusion - PET scan in March, will order on next visit -Continue Xeloda  and tucatinib  at home     SUMMARY OF ONCOLOGIC HISTORY: Oncology History Overview Note  Cancer Staging Malignant neoplasm of lower-inner quadrant of left breast in female, estrogen receptor positive ([name]) Staging form: Breast, AJCC 8th Edition - Clinical stage from 01/16/2018: Stage IA (cT1c, cN0, cM0, G2, ER+, PR+, HER2-) - Signed by [name], MD on 01/23/2018 - Pathologic: Stage IA (pT1c, pN1, cM0, G1, ER+, PR+, HER2-) - Signed by [name], MD on 04/09/2018     Malignant neoplasm of lower-inner quadrant of left breast in female, estrogen receptor positive ([name])  01/15/2018 Mammogram   Diagnositc Mammogram 01/15/18  IMPRESSION: 1. Suspicious 1.2 x 1.4 x 1.3 cm mixed echogenicity mass left breast 7 o'clock position retroareolar location at the site of palpable concern.. 2. Indeterminate Within the left breast 7:30 o'clock retroareolar location, adjacent to the palpable mass, is a 0.5 x 0.4 x 0.5 cm oval circumscribed hypoechoic mass. 3. Indeterminate calcifications within the lateral left breast. Location of these calcifications is not definitely confirmed on the true lateral view.    01/16/2018 Initial Biopsy   Diagnosis 01/16/18 1. Breast, left, needle core biopsy, 7:30 o'clock (ribbon clip) - FIBROCYSTIC CHANGES WITH SCLEROSING ADENOSIS AND CALCIFICATIONS. - FIBROADENOMATOID CHANGE. - NO MALIGNANCY IDENTIFIED. 2. Breast, left, needle core biopsy, 7 o'clock position (coil clip) - INVASIVE  MAMMARY CARCINOMA, MSBR GRADE I/II. - SEE MICROSCOPIC DESCRIPTION Microscopic Comment  ADDENDUM: Immunohistochemistry for E-Cadherin is strongly positive in the tumor consistent with ductal carcinoma. (JDP:ah 01/17/18)   01/16/2018 Receptors her2   Estrogen Receptor: 100%, POSITIVE, STRONG STAINING INTENSITY Progesterone Receptor: 50%, POSITIVE, STRONG STAINING INTENSITY Proliferation Marker Ki67: 20% HER2 Negative   01/16/2018 Cancer Staging   Staging form: Breast, AJCC 8th Edition - Clinical stage from 01/16/2018: Stage IA (cT1c, cN0, cM0, G2, ER+, PR+, HER2-) - Signed by [name], MD on 01/23/2018   01/22/2018 Initial Diagnosis   Malignant neoplasm of lower-inner quadrant of left breast in female, estrogen receptor positive ([name])   02/21/2018 Surgery    LEFT BREAST LUMPECTOMY WITH AXILLARY LYMPH NODE BIOPSY by Dr. Aron  02/21/18   02/21/2018 Pathology Results   Diagnosis 02/21/18 1. Breast, lumpectomy, Left - INVASIVE DUCTAL CARCINOMA, GRADE I, 1.6 CM. - DUCTAL CARCINOMA IN SITU, INTERMEDIATE NUCLEAR GRADE. - ANTERIOR AND MEDIAL RESECTION MARGINS ARE POSITIVE FOR CARCINOMA. - NEGATIVE FOR LYMPHOVASCULAR OR PERINEURAL INVASION. - BACKGROUND BREAST TISSUE WITH FIBROCYSTIC CHANGE, INCLUDING SCLEROSING ADENOSIS. - BIOPSY SITE CHANGES. - SEE ONCOLOGY TABLE. 2. Lymph node, sentinel, biopsy, Left Axillary #1 - METASTATIC BREAST CARCINOMA TO A LYMPH NODE, 1.0 CM IN GREATEST DIMENSION, WITH EXTRANODAL EXTENSION (1/1). 3. Lymph node, sentinel, biopsy, Left Axillary #2 - LYMPH NODE, NEGATIVE FOR CARCINOMA (0/1).    02/21/2018 Miscellaneous   Mammaprint 02/21/18 Low Risk with 10-year risk of recurrnce at 10% -No potential signifcant chemotherapy benefit   03/20/2018 Pathology Results   RE-EXCISION OF BREAST LUMPECTOMY by Dr. Aron  Diagnosis 03/20/18 1. Breast, excision, Left new anterior  margin - FIBROCYSTIC CHANGES WITH ADENOSIS AND CALCIFICATIONS. - HEALING BIOPSY SITE. - THERE  IS NO EVIDENCE OF MALIGNANCY. 2. Breast, excision, Left new medial margin - FIBROCYSTIC CHANGES WITH ADENOSIS AND CALCIFICATIONS. - HEALING BIOPSY SITE. - THERE IS NO EVIDENCE OF MALIGNANCY. Microscopic Comment 1. -2. The surgical resection margin(s) of the specimen were inked and microscopically evaluated. (JBK:kh 03-22-18)   04/09/2018 Cancer Staging   Staging form: Breast, AJCC 8th Edition - Pathologic: Stage IA (pT1c, pN1, cM0, G1, ER+, PR+, HER2-) - Signed by [name], MD on 04/09/2018   04/22/2018 - 06/03/2018 Radiation Therapy   Radaiton with Dr. Izell 04/22/18-06/03/18   05/2018 -  Anti-estrogen oral therapy   Letrozole  2.5mg  started 05/2018    Survivorship   Per Mayme Silversmith, NP    05/04/2022 Imaging    IMPRESSION: Cervical spondylosis, as described.   Nonspecific straightening of the expected cervical lordosis.   07/24/2022 Imaging    IMPRESSION: 1. Extensive osseous metastatic disease with pathologic fracture at the base of dens and C2 right lateral mass. Extraosseous tumor at C1 and C2 likely impinging on the right C2 and C3 nerve roots. 2. Degenerative cord impingement at C4-5 to C6-7. Biforaminal impingement at C5-6 and C6-7.   08/03/2022 PET scan    IMPRESSION: 1. Large volume osseous metastasis. 2. Low right cervical and probable right axillary nodal metastasis. 3. Subtle heterogeneous activity throughout the liver with suggestion of small liver lesions (likely new compared to chest CT of 07/16/2020). Findings are overall moderately suspicious for hepatic metastasis. Pre and post contrast abdominal MRI (preferred) or CT could confirm. 4. Right-sided pleural thickening and trace pleural fluid. Right base airspace disease is favored to represent chronic atelectasis. 5. Aortic atherosclerosis (ICD10-I70.0) and emphysema (ICD10-J43.9).     08/11/2022 Genetic Testing   Negative genetic testing on the CancerNext-Expanded+RNAinsight panel.  The report date is  August 11, 2022.  The CancerNext-Expanded gene panel offered by [facility] and includes sequencing and rearrangement analysis for the following 77 genes: AIP, ALK, APC*, ATM*, AXIN2, BAP1, BARD1, BLM, BMPR1A, BRCA1*, BRCA2*, BRIP1*, CDC73, CDH1*, CDK4, CDKN1B, CDKN2A, CHEK2*, CTNNA1, DICER1, FANCC, FH, FLCN, GALNT12, KIF1B, LZTR1, MAX, MEN1, MET, MLH1*, MSH2*, MSH3, MSH6*, MUTYH*, NBN, NF1*, NF2, NTHL1, PALB2*, PHOX2B, PMS2*, POT1, PRKAR1A, PTCH1, PTEN*, RAD51C*, RAD51D*, RB1, RECQL, RET, SDHA, SDHAF2, SDHB, SDHC, SDHD, SMAD4, SMARCA4, SMARCB1, SMARCE1, STK11, SUFU, TMEM127, TP53*, TSC1, TSC2, VHL and XRCC2 (sequencing and deletion/duplication); EGFR, EGLN1, HOXB13, KIT, MITF, PDGFRA, POLD1, and POLE (sequencing only); EPCAM and GREM1 (deletion/duplication only). DNA and RNA analyses performed for * genes.    Lobular carcinoma in situ (LCIS) of right breast  01/20/2020 Mammogram   Diagnostic Mammogram 01/20/20 IMPRESSION: 1.  Stable post lumpectomy changes of the left breast.   2. Suspicious microcalcifications over the right upper outer quadrant spanning 3.6 cm.   02/02/2020 Initial Biopsy   Diagnosis 02/02/20 Breast, right, needle core biopsy, upper outer quadrant, x clip - LOBULAR CARCINOMA IN SITU WITH PLEOMORPHIC FEATURES AND CALCIFICATIONS, INVOLVING ADENOSIS. SEE NOTE Diagnosis Note Immunohistochemical stain for E-cadherin is negative in the lesional cells, consistent with a lobular phenotype. Immunostains for p63, SMM 1 and calponin do not show evidence of invasive carcinoma.    02/04/2020 Initial Diagnosis   Lobular carcinoma in situ (LCIS) of right breast   03/18/2020 Surgery   RIGHT BREAST LUMPECTOMY WITH RADIOACTIVE SEED LOCALIZATION by Dr Marla    03/18/2020 Pathology Results   FINAL MICROSCOPIC DIAGNOSIS:   A. BREAST, RIGHT, LUMPECTOMY:  - Pleomorphic  lobular carcinoma in situ with calcifications and  underlying complex sclerosing lesion, adenosis and fibroadenomatoid   change.  - Margins of resection are not involved (Closest margins: < 1 mm,  anterior, posterior, inferior and medial).  - Biopsy site.    COMMENT:   P63, Calponin and SMM-1 demonstrate the presence of myoepithelium in the  select focus.    Metastatic breast cancer to bone and brain  05/04/2022 Imaging    IMPRESSION: Cervical spondylosis, as described.   Nonspecific straightening of the expected cervical lordosis.   07/24/2022 Imaging    IMPRESSION: 1. Extensive osseous metastatic disease with pathologic fracture at the base of dens and C2 right lateral mass. Extraosseous tumor at C1 and C2 likely impinging on the right C2 and C3 nerve roots. 2. Degenerative cord impingement at C4-5 to C6-7. Biforaminal impingement at C5-6 and C6-7.   08/02/2022 Initial Diagnosis   Cancer, metastatic to bone ([name])   08/03/2022 PET scan    IMPRESSION: 1. Large volume osseous metastasis. 2. Low right cervical and probable right axillary nodal metastasis. 3. Subtle heterogeneous activity throughout the liver with suggestion of small liver lesions (likely new compared to chest CT of 07/16/2020). Findings are overall moderately suspicious for hepatic metastasis. Pre and post contrast abdominal MRI (preferred) or CT could confirm. 4. Right-sided pleural thickening and trace pleural fluid. Right base airspace disease is favored to represent chronic atelectasis. 5. Aortic atherosclerosis (ICD10-I70.0) and emphysema (ICD10-J43.9).     10/26/2022 - 04/12/2023 Chemotherapy   Patient is on Treatment Plan : BREAST Paclitaxel  D1,8,15 + Trastuzumab  D1 + Pertuzumab  D1 q21d x 8 cycles / Trastuzumab  D1 + Pertuzumab  D1 q21d x 4 cycles     05/03/2023 -  Chemotherapy   Patient is on Treatment Plan : BREAST MAINTENANCE Trastuzumab  IV (6) or SQ (600) D1 q21d X 11 Cycles        Discussed the use of AI scribe software for clinical note transcription with the patient, who gave verbal consent to  proceed.  History of Present Illness   The patient is a 63 year old woman with a history of metastatic breast cancer, who presents for a routine follow-up visit. She reports feeling well overall, with increased energy and activity levels. However, she has been experiencing lower back pain, which she attributes to increased physical activity. She denies any significant pain and is not using a walker for mobility. She also reports having diarrhea every other day, which she manages with medication. She denies any nausea or other side effects from her oral chemotherapy. She has been trying to gain weight by eating larger portions more frequently, but has been struggling to do so. Her weight has remained stable at 74 kg. She also mentions that she is scheduled for an MRI in May.         All other systems were reviewed with the patient and are negative.  MEDICAL HISTORY:  Past Medical History:  Diagnosis Date   Anxiety    Cancer ([name]) 2022   right breast LCIS   Cancer ([name]) 2019   left breast   Depression    Dysrhythmia    SVT, s/p ablation ~ 2012 in at [facility]   Ectopic pregnancy    Family history of breast cancer    Family history of melanoma    History of radiation therapy 04/22/18-06/03/18   Left Breast, left SCV, axilla 50 Gy in 25 fractions, Left breast boost 10 Gy in 5 fractions.    Hypothyroidism  Personal history of radiation therapy    PONV (postoperative nausea and vomiting)    Thyroid  disease     SURGICAL HISTORY: Past Surgical History:  Procedure Laterality Date   APPENDECTOMY     BILATERAL SALPINGECTOMY     BREAST BIOPSY Right 2014   fibroadenoma   BREAST BIOPSY Left 01/16/2018   BREAST BIOPSY Right 02/02/2020   LCIS   BREAST BIOPSY Right 08/04/2020   x2 LCIS   BREAST BIOPSY Right 08/13/2020   x2   BREAST EXCISIONAL BIOPSY Left 1990   benign   BREAST EXCISIONAL BIOPSY Right 02/02/2020   LCIS   BREAST LUMPECTOMY Left 01/22/2018   BREAST  LUMPECTOMY WITH AXILLARY LYMPH NODE BIOPSY Left 02/21/2018   Procedure: LEFT BREAST LUMPECTOMY WITH AXILLARY LYMPH NODE BIOPSY;  Surgeon: [name], MD;  Location: [facility] OR;  Service: General;  Laterality: Left;   BREAST LUMPECTOMY WITH RADIOACTIVE SEED LOCALIZATION Right 03/18/2020   Procedure: RIGHT BREAST LUMPECTOMY WITH RADIOACTIVE SEED LOCALIZATION;  Surgeon: [name], MD;  Location: [facility] OR;  Service: General;  Laterality: Right;   BREAST SURGERY Left 1993   cyst removed   ENDOMETRIAL ABLATION     EYE SURGERY     GLAUCOMA SURGERY Bilateral    IR IMAGING GUIDED PORT INSERTION  10/16/2022   POSTERIOR CERVICAL FUSION/FORAMINOTOMY N/A 08/17/2022   Procedure: Cervical One-Cervical Four POSTERIOR CERVICAL FUSION,  Cervical One LAMINECTOMY REDUCTION OF Cervical Two Fracture, Biopsy of Right Cerical Two Pars  Lesion;  Surgeon: Debby Dorn MATSU, MD;  Location: [facility] OR;  Service: Neurosurgery;  Laterality: N/A;   POSTERIOR CERVICAL FUSION/FORAMINOTOMY N/A 09/06/2022   Procedure: Posterior Cervical Fusion, Foraminotomy , Cervical Five-Six, Cervical Six-Seven; Extension of  fusion Cervical Four- Thoracic One;  Surgeon: Debby Dorn MATSU, MD;  Location[facility] OR;  Service: Neurosurgery;  Laterality: N/A;   RADIOACTIVE SEED GUIDED EXCISIONAL BREAST BIOPSY Right 04/28/2021   Procedure: RADIOACTIVE SEED GUIDED EXCISIONAL RIGHT BREAST BIOPSY X2;  Surgeon: [name], MD;  Location: [facility];  Service: General;  Laterality: Right;   RE-EXCISION OF BREAST LUMPECTOMY Left 03/20/2018   Procedure: RE-EXCISION OF BREAST LUMPECTOMY;  Surgeon: [name], MD;  Location: [facility];  Service: General;  Laterality: Left;    I have reviewed the social history and family history with the patient and they are unchanged from previous note.  ALLERGIES:  is allergic to morphine and codeine.  MEDICATIONS:  Current Outpatient Medications  Medication Sig Dispense Refill    acetaminophen  (TYLENOL ) 500 MG tablet Take 2 tablets (1,000 mg total) by mouth every 8 (eight) hours. 30 tablet 0   capecitabine  (XELODA ) 500 MG tablet TAKE 2 TABLETS TWICE DAILY FOR 7 DAYS ON THEN 7 DAYS OFF. TAKE AFTER MEALS 56 tablet 0   citalopram  (CELEXA ) 40 MG tablet TAKE 1 TABLET BY MOUTH EVERY DAY 90 tablet 2   diphenoxylate -atropine  (LOMOTIL ) 2.5-0.025 MG tablet Take 1 tablet by mouth 4 (four) times daily as needed for diarrhea or loose stools. 90 tablet 0   levothyroxine  (SYNTHROID ) 75 MCG tablet Take 1 tablet (75 mcg total) by mouth daily before breakfast. 30 tablet 0   lidocaine  (XYLOCAINE ) 2 % solution Patient: Mix 1part 2% viscous lidocaine , 1part H20. Swallow 10mL of diluted mixture, before meals and at bedtime, up to QID 100 mL 3   Loperamide  HCl (IMODIUM  PO) Take by mouth as needed.     LORazepam  (ATIVAN ) 0.5 MG tablet Take 1 tablet (0.5 mg total) by mouth every 6 (  six) hours as needed for anxiety (agitation). 30 tablet 0   Mouthwashes (MOUTH RINSE) LIQD solution 15 mLs by Mouth Rinse route as needed (for oral care).  0   nicotine  (NICODERM CQ  - DOSED IN MG/24 HR) 7 mg/24hr patch Place 1 patch (7 mg total) onto the skin daily. 28 patch 0   ondansetron  (ZOFRAN ) 8 MG tablet Take 1 tablet (8 mg total) by mouth every 8 (eight) hours as needed for nausea or vomiting. 20 tablet 1   OVER THE COUNTER MEDICATION Take 1 tablet by mouth daily. Protandim Supplement     oxyCODONE  (OXY IR/ROXICODONE ) 5 MG immediate release tablet Take 0.5-1 tablets (2.5-5 mg total) by mouth every 4 (four) hours as needed for moderate pain or severe pain (dyspnea). 30 tablet 0   pregabalin  (LYRICA ) 25 MG capsule Take 1 capsule (25 mg total) by mouth 2 (two) times daily. 60 capsule 2   senna-docusate (SENOKOT-S) 8.6-50 MG tablet Take 1 tablet by mouth 2 (two) times daily. 60 tablet 0   tucatinib  (TUKYSA ) 150 MG tablet Take 2 tablets (300 mg total) by mouth 2 (two) times daily. Take as instructed per MD. 120  tablet 2   No current facility-administered medications for this visit.   Facility-Administered Medications Ordered in Other Visits  Medication Dose Route Frequency Provider Last Rate Last Admin   sodium chloride  flush (NS) 0.9 % injection 10 mL  10 mL Intracatheter PRN [name], [name], NP   10 mL at 09/06/23 1023    PHYSICAL EXAMINATION: ECOG PERFORMANCE STATUS: 2 - Symptomatic, <50% confined to bed  Vitals:   09/06/23 0814  BP: 131/71  Pulse: 83  Resp: 15  Temp: 98.2 F (36.8 C)  SpO2: 96%   Wt Readings from Last 3 Encounters:  09/06/23 73 lb 14.4 oz (33.5 kg)  09/04/23 74 lb 4.8 oz (33.7 kg)  08/02/23 75 lb 3.2 oz (34.1 kg)     GENERAL:alert, no distress and comfortable SKIN: skin color, texture, turgor are normal, no rashes or significant lesions EYES: normal, Conjunctiva are pink and non-injected, sclera clear NECK: supple, thyroid  normal size, non-tender, without nodularity LYMPH:  no palpable lymphadenopathy in the cervical, axillary  LUNGS: clear to auscultation and percussion with normal breathing effort HEART: regular rate & rhythm and no murmurs and no lower extremity edema ABDOMEN:abdomen soft, non-tender and normal bowel sounds Musculoskeletal:no cyanosis of digits and no clubbing  NEURO: alert & oriented x 3 with fluent speech, no focal motor/sensory deficits   LABORATORY DATA:  I have reviewed the data as listed    Latest Ref Rng & Units 09/06/2023    7:58 AM 08/02/2023    8:03 AM 07/12/2023    8:05 AM  CBC  WBC 4.0 - 10.5 K/uL 6.3  7.6  4.8   Hemoglobin 12.0 - 15.0 g/dL 85.7  85.2  84.8   Hematocrit 36.0 - 46.0 % 40.5  43.0  44.0   Platelets 150 - 400 K/uL 232  289  232         Latest Ref Rng & Units 09/06/2023    7:58 AM 08/02/2023    8:03 AM 07/12/2023    8:05 AM  CMP  Glucose 70 - 99 mg/dL 878  877  841   BUN 8 - 23 mg/dL 11  13  13    Creatinine 0.44 - 1.00 mg/dL 9.51  9.53  9.59   Sodium 135 - 145 mmol/L 137  137  139   Potassium 3.5 - 5.1  mmol/L 3.9  4.0  3.7   Chloride 98 - 111 mmol/L 101  102  102   CO2 22 - 32 mmol/L 31  29  30    Calcium 8.9 - 10.3 mg/dL 9.3  9.7  9.5   Total Protein 6.5 - 8.1 g/dL 6.7  6.5  6.7   Total Bilirubin 0.0 - 1.2 mg/dL 0.8  0.7  0.4   Alkaline Phos 38 - 126 U/L 112  133  114   AST 15 - 41 U/L 20  18  23    ALT 0 - 44 U/L 13  17  19        RADIOGRAPHIC STUDIES: I have personally reviewed the radiological images as listed and agreed with the findings in the report. No results found.    No orders of the defined types were placed in this encounter.  All questions were answered. The patient knows to call the clinic with any problems, questions or concerns. No barriers to learning was detected. The total time spent in the appointment was 25 minutes.     Onita Mattock, MD 09/06/2023

## 2023-09-08 ENCOUNTER — Encounter: Payer: Self-pay | Admitting: Nurse Practitioner

## 2023-09-08 ENCOUNTER — Encounter: Payer: Self-pay | Admitting: Hematology

## 2023-09-10 ENCOUNTER — Other Ambulatory Visit: Payer: Self-pay | Admitting: Nurse Practitioner

## 2023-09-10 DIAGNOSIS — Z515 Encounter for palliative care: Secondary | ICD-10-CM

## 2023-09-10 DIAGNOSIS — R197 Diarrhea, unspecified: Secondary | ICD-10-CM

## 2023-09-10 DIAGNOSIS — C7951 Secondary malignant neoplasm of bone: Secondary | ICD-10-CM

## 2023-09-10 MED ORDER — OXYCODONE HCL 5 MG PO TABS: 2.5000 mg | ORAL_TABLET | ORAL | 0 refills | Status: DC | PRN

## 2023-09-10 MED ORDER — DIPHENOXYLATE-ATROPINE 2.5-0.025 MG PO TABS: 1.0000 | ORAL_TABLET | Freq: Four times a day (QID) | ORAL | 0 refills | Status: DC | PRN

## 2023-09-12 ENCOUNTER — Other Ambulatory Visit (HOSPITAL_COMMUNITY): Payer: Self-pay

## 2023-09-17 ENCOUNTER — Other Ambulatory Visit: Payer: Self-pay | Admitting: Nurse Practitioner

## 2023-09-17 DIAGNOSIS — C7951 Secondary malignant neoplasm of bone: Secondary | ICD-10-CM

## 2023-09-20 ENCOUNTER — Other Ambulatory Visit: Payer: BC Managed Care – PPO

## 2023-09-20 ENCOUNTER — Ambulatory Visit: Payer: BC Managed Care – PPO

## 2023-09-20 ENCOUNTER — Ambulatory Visit: Payer: BC Managed Care – PPO | Admitting: Hematology

## 2023-09-23 ENCOUNTER — Other Ambulatory Visit: Payer: Self-pay | Admitting: Nurse Practitioner

## 2023-09-23 DIAGNOSIS — C7951 Secondary malignant neoplasm of bone: Secondary | ICD-10-CM

## 2023-09-24 ENCOUNTER — Encounter: Payer: Self-pay | Admitting: Hematology

## 2023-09-24 MED ORDER — CAPECITABINE 500 MG PO TABS: ORAL_TABLET | ORAL | 0 refills | Status: DC

## 2023-09-26 ENCOUNTER — Other Ambulatory Visit: Payer: Self-pay | Admitting: Nurse Practitioner

## 2023-09-26 DIAGNOSIS — C7951 Secondary malignant neoplasm of bone: Secondary | ICD-10-CM

## 2023-09-26 NOTE — Assessment & Plan Note (Addendum)
-  Diagnosed in 06/2022, ER-/PR-/HER2+ --PET scan from 08/03/2022 showed diffuse bone mets  -she underwent cervical laminectomy and fusion by Dr. Maisie Fus on 08/17/2022, biopsy confirmed metastatic breast cancer, ER/PR negative, HER2 positive. -due to cervical cord compression with myelopathy, she underwent a second cervical spine surgery on September 06, 2022, and completed inpatient rehabitation. -She has completed brain, cervical and lumbar spine radiation in Feb 2024 -She was recently hospitalized for altered mental status and confusion, probably secondary to medication -She started first line systemic chemotherapy with weekly Taxol and trastuzumab/Perjeta on 10/25/22. She has tolerated well so far and her pain has improved overall -PET scan from Jan 01, 2023 showed complete metabolic response, no hypermetabolic uptake in diffuse bone lesions, no other new lesions.  She is clinically also doing much better.  Given her excellent response to chemo, we will change her Taxol to 2 weeks on and 1 week off -She Unfortunately developed more more brain metastasis on recent brain MRI. -I personally reviewed her restaging PET scan from April 13, 2023, which showed stable disease, no new lesions. -Due to her new brain metastasis, her treatment was changed to Tucatinib, capecitabine and trastuzumab, for better brain penetration. She started in late August 2024, tolerating well so far.  Her case was reviewed in CNS tumor board, Dr. Basilio Cairo plan to hold on whole brain radiation since she is starting new Her2 antibody and monitor her brain mets closely.  -she is also on Zometa every 3 months -09/27/2023 - doing well in general. Will continue Tucatinib and capecitabine daily at home.  Proceed with trastuzumab today and again every 3 weeks.  Zometa given today.

## 2023-09-26 NOTE — Assessment & Plan Note (Addendum)
She was diagnosed in 12/2017. ER+/PR+/HER2- -She is s/p left breast lumpectomy and adjuvant radiation.  -She started anti-estrogen therapy with letrozole on 05/2018. Tolerating well with no issues -lost f/u after visit in 04/2020 until her recurrence in 07/2022

## 2023-09-26 NOTE — Progress Notes (Signed)
Patient Care Team: Ardith Dark, MD as PCP - General (Family Medicine) Almond Lint, MD as Consulting Physician (General Surgery) Malachy Mood, MD as Consulting Physician (Hematology) Lonie Peak, MD as Attending Physician (Radiation Oncology) Pollyann Samples, NP as Nurse Practitioner (Nurse Practitioner)  Clinic Day: 1/302025  Referring physician: Malachy Mood, MD  ASSESSMENT & PLAN:   Assessment & Plan: Malignant neoplasm of lower-inner quadrant of left breast in female, estrogen receptor positive ([name]) She was diagnosed in 12/2017. ER+/PR+/HER2- -She is s/p left breast lumpectomy and adjuvant radiation.  -She started anti-estrogen therapy with letrozole on 05/2018. Tolerating well with no issues -lost f/u after visit in 04/2020 until her recurrence in 07/2022       Metastatic breast cancer to bone and brain -Diagnosed in 06/2022, ER-/PR-/HER2+ --PET scan from 08/03/2022 showed diffuse bone mets  -she underwent cervical laminectomy and fusion by Dr. Maisie Fus on 08/17/2022, biopsy confirmed metastatic breast cancer, ER/PR negative, HER2 positive. -due to cervical cord compression with myelopathy, she underwent a second cervical spine surgery on September 06, 2022, and completed inpatient rehabitation. -She has completed brain, cervical and lumbar spine radiation in Feb 2024 -She was recently hospitalized for altered mental status and confusion, probably secondary to medication -She started first line systemic chemotherapy with weekly Taxol and trastuzumab/Perjeta on 10/25/22. She has tolerated well so far and her pain has improved overall -PET scan from Jan 01, 2023 showed complete metabolic response, no hypermetabolic uptake in diffuse bone lesions, no other new lesions.  She is clinically also doing much better.  Given her excellent response to chemo, we will change her Taxol to 2 weeks on and 1 week off -She Unfortunately developed more more brain metastasis on recent brain MRI. -I  personally reviewed her restaging PET scan from April 13, 2023, which showed stable disease, no new lesions. -Due to her new brain metastasis, her treatment was changed to Tucatinib, capecitabine and trastuzumab, for better brain penetration. She started in late August 2024, tolerating well so far.  Her case was reviewed in CNS tumor board, Dr. Basilio Cairo plan to hold on whole brain radiation since she is starting new Her2 antibody and monitor her brain mets closely.  -she is also on Zometa every 3 months     Plan: Labs reviewed  -CBC showing WBC 5.8; Hgb 15.0; Hct 43.8; Plt 217; Anc 4.3 -CMP - K 4.2; glucose 103; BUN 15; Creatinine 0.40; eGFR >60; Ca 9.5; LFTs normal.   Tolerating treatments well overall.  States she is feeling better the past few months. She should continue capecitabine and Tucatinib daily at home.  Pharmacy has reached out to both [facility] and [facility] and patient should be receiving these medications in next several days. Patient presentation and labs are satisfactory for treatment today.  Proceed with trastuzumab and Zometa today.  Labs/flush, follow-up with Dr. Mosetta Putt and treatment in 3 weeks as scheduled.    The patient understands the plans discussed today and is in agreement with them.  She knows to contact our office if she develops concerns prior to her next appointment.  I provided 25 minutes of face-to-face time during this encounter and > 50% was spent counseling as documented under my assessment and plan.    Carlean Jews, NP  [facility] - A DEPT OF [facility] [address] [place] [address] Dept: [phone] Dept Fax: [phone]   No orders of the defined types were  placed in this encounter.     CHIEF COMPLAINT:  CC: Left breast cancer, estrogen receptor positive  Current Treatment: Trastuzumab every 3 weeks, Xeloda, and Tucatinib.  Zometa every 3 months.  INTERVAL HISTORY:   Martha [name] is here today for repeat clinical assessment.  He was last seen by Dr. Mosetta Putt on 09/06/2023.  She reports tolerating this treatment well overall.  She does have some manageable low back pain.  She states her appetite is improved.  She is eating foods high in protein, carbohydrates, and calories.  She denies nausea or vomiting.  She does have intermittent diarrhea.  Generally takes Lomotil 3 times daily.  She will take it a fourth time if needed for severe diarrhea.  She does have baseline neuropathy in hands and feet which is worse on the left side than the right.  She denies fevers or chills. She denies pain. Her appetite is good. Her weight has increased 1 pounds over last 3 weeks .  I have reviewed the past medical history, past surgical history, social history and family history with the patient and they are unchanged from previous note.  ALLERGIES:  is allergic to morphine and codeine.  MEDICATIONS:  Current Outpatient Medications  Medication Sig Dispense Refill   acetaminophen (TYLENOL) 500 MG tablet Take 2 tablets (1,000 mg total) by mouth every 8 (eight) hours. 30 tablet 0   capecitabine (XELODA) 500 MG tablet TAKE 2 TABLETS TWICE DAILY FOR 7 DAYS ON THEN 7 DAYS OFF. TAKE AFTER MEALS 168 tablet 0   citalopram (CELEXA) 40 MG tablet TAKE 1 TABLET BY MOUTH EVERY DAY 90 tablet 2   diphenoxylate-atropine (LOMOTIL) 2.5-0.025 MG tablet Take 1 tablet by mouth 4 (four) times daily as needed for diarrhea or loose stools. 90 tablet 0   levothyroxine (SYNTHROID) 75 MCG tablet Take 1 tablet (75 mcg total) by mouth daily before breakfast. 30 tablet 0   lidocaine (XYLOCAINE) 2 % solution Patient: Mix 1part 2% viscous lidocaine, 1part H20. Swallow 10mL of diluted mixture, before meals and at bedtime, up to QID 100 mL 3   Loperamide HCl (IMODIUM PO) Take by mouth as needed.     LORazepam (ATIVAN) 0.5 MG tablet Take 1 tablet (0.5 mg total) by mouth every 6 (six) hours as needed for anxiety  (agitation). 30 tablet 0   Mouthwashes (MOUTH RINSE) LIQD solution 15 mLs by Mouth Rinse route as needed (for oral care).  0   nicotine (NICODERM CQ - DOSED IN MG/24 HR) 7 mg/24hr patch Place 1 patch (7 mg total) onto the skin daily. 28 patch 0   ondansetron (ZOFRAN) 8 MG tablet Take 1 tablet (8 mg total) by mouth every 8 (eight) hours as needed for nausea or vomiting. 20 tablet 1   OVER THE COUNTER MEDICATION Take 1 tablet by mouth daily. Protandim Supplement     oxyCODONE (OXY IR/ROXICODONE) 5 MG immediate release tablet Take 0.5-1 tablets (2.5-5 mg total) by mouth every 4 (four) hours as needed for moderate pain (pain score 4-6) or severe pain (pain score 7-10) (dyspnea). 30 tablet 0   pregabalin (LYRICA) 25 MG capsule Take 1 capsule (25 mg total) by mouth 2 (two) times daily. 60 capsule 2   senna-docusate (SENOKOT-S) 8.6-50 MG tablet Take 1 tablet by mouth 2 (two) times daily. 60 tablet 0   tucatinib (TUKYSA) 150 MG tablet Take 2 tablets (300 mg total) by mouth 2 (two) times daily. Take as instructed per MD. 120 tablet 2   No current  facility-administered medications for this visit.    HISTORY OF PRESENT ILLNESS:   Oncology History Overview Note  Cancer Staging Malignant neoplasm of lower-inner quadrant of left breast in female, estrogen receptor positive ([name]) Staging form: Breast, AJCC 8th Edition - Clinical stage from 01/16/2018: Stage IA (cT1c, cN0, cM0, G2, ER+, PR+, HER2-) - Signed by Malachy Mood, MD on 01/23/2018 - Pathologic: Stage IA (pT1c, pN1, cM0, G1, ER+, PR+, HER2-) - Signed by Lonie Peak, MD on 04/09/2018     Malignant neoplasm of lower-inner quadrant of left breast in female, estrogen receptor positive ([name])  01/15/2018 Mammogram   Diagnositc Mammogram 01/15/18  IMPRESSION: 1. Suspicious 1.2 x 1.4 x 1.3 cm mixed echogenicity mass left breast 7 o'clock position retroareolar location at the site of palpable concern.. 2. Indeterminate Within the left breast 7:30 o'clock  retroareolar location, adjacent to the palpable mass, is a 0.5 x 0.4 x 0.5 cm oval circumscribed hypoechoic mass. 3. Indeterminate calcifications within the lateral left breast. Location of these calcifications is not definitely confirmed on the true lateral view.    01/16/2018 Initial Biopsy   Diagnosis 01/16/18 1. Breast, left, needle core biopsy, 7:30 o'clock (ribbon clip) - FIBROCYSTIC CHANGES WITH SCLEROSING ADENOSIS AND CALCIFICATIONS. - FIBROADENOMATOID CHANGE. - NO MALIGNANCY IDENTIFIED. 2. Breast, left, needle core biopsy, 7 o'clock position (coil clip) - INVASIVE MAMMARY CARCINOMA, MSBR GRADE I/II. - SEE MICROSCOPIC DESCRIPTION Microscopic Comment  ADDENDUM: Immunohistochemistry for E-Cadherin is strongly positive in the tumor consistent with ductal carcinoma. (JDP:ah 01/17/18)   01/16/2018 Receptors her2   Estrogen Receptor: 100%, POSITIVE, STRONG STAINING INTENSITY Progesterone Receptor: 50%, POSITIVE, STRONG STAINING INTENSITY Proliferation Marker Ki67: 20% HER2 Negative   01/16/2018 Cancer Staging   Staging form: Breast, AJCC 8th Edition - Clinical stage from 01/16/2018: Stage IA (cT1c, cN0, cM0, G2, ER+, PR+, HER2-) - Signed by Malachy Mood, MD on 01/23/2018   01/22/2018 Initial Diagnosis   Malignant neoplasm of lower-inner quadrant of left breast in female, estrogen receptor positive ([name])   02/21/2018 Surgery    LEFT BREAST LUMPECTOMY WITH AXILLARY LYMPH NODE BIOPSY by Dr. Donell Beers  02/21/18   02/21/2018 Pathology Results   Diagnosis 02/21/18 1. Breast, lumpectomy, Left - INVASIVE DUCTAL CARCINOMA, GRADE I, 1.6 CM. - DUCTAL CARCINOMA IN SITU, INTERMEDIATE NUCLEAR GRADE. - ANTERIOR AND MEDIAL RESECTION MARGINS ARE POSITIVE FOR CARCINOMA. - NEGATIVE FOR LYMPHOVASCULAR OR PERINEURAL INVASION. - BACKGROUND BREAST TISSUE WITH FIBROCYSTIC CHANGE, INCLUDING SCLEROSING ADENOSIS. - BIOPSY SITE CHANGES. - SEE ONCOLOGY TABLE. 2. Lymph node, sentinel, biopsy, Left Axillary #1 -  METASTATIC BREAST CARCINOMA TO A LYMPH NODE, 1.0 CM IN GREATEST DIMENSION, WITH EXTRANODAL EXTENSION (1/1). 3. Lymph node, sentinel, biopsy, Left Axillary #2 - LYMPH NODE, NEGATIVE FOR CARCINOMA (0/1).    02/21/2018 Miscellaneous   Mammaprint 02/21/18 Low Risk with 10-year risk of recurrnce at 10% -No potential signifcant chemotherapy benefit   03/20/2018 Pathology Results   RE-EXCISION OF BREAST LUMPECTOMY by Dr. Donell Beers  Diagnosis 03/20/18 1. Breast, excision, Left new anterior margin - FIBROCYSTIC CHANGES WITH ADENOSIS AND CALCIFICATIONS. - HEALING BIOPSY SITE. - THERE IS NO EVIDENCE OF MALIGNANCY. 2. Breast, excision, Left new medial margin - FIBROCYSTIC CHANGES WITH ADENOSIS AND CALCIFICATIONS. - HEALING BIOPSY SITE. - THERE IS NO EVIDENCE OF MALIGNANCY. Microscopic Comment 1. -2. The surgical resection margin(s) of the specimen were inked and microscopically evaluated. (JBK:kh 03-22-18)   04/09/2018 Cancer Staging   Staging form: Breast, AJCC 8th Edition - Pathologic: Stage IA (pT1c, pN1, cM0, G1, ER+, PR+, HER2-) -  Signed by Lonie Peak, MD on 04/09/2018   04/22/2018 - 06/03/2018 Radiation Therapy   Radaiton with Dr. Basilio Cairo 04/22/18-06/03/18   05/2018 -  Anti-estrogen oral therapy   Letrozole 2.5mg  started 05/2018    Survivorship   Per Santiago Glad, NP    05/04/2022 Imaging    IMPRESSION: Cervical spondylosis, as described.   Nonspecific straightening of the expected cervical lordosis.   07/24/2022 Imaging    IMPRESSION: 1. Extensive osseous metastatic disease with pathologic fracture at the base of dens and C2 right lateral mass. Extraosseous tumor at C1 and C2 likely impinging on the right C2 and C3 nerve roots. 2. Degenerative cord impingement at C4-5 to C6-7. Biforaminal impingement at C5-6 and C6-7.   08/03/2022 PET scan    IMPRESSION: 1. Large volume osseous metastasis. 2. Low right cervical and probable right axillary nodal metastasis. 3. Subtle  heterogeneous activity throughout the liver with suggestion of small liver lesions (likely new compared to chest CT of 07/16/2020). Findings are overall moderately suspicious for hepatic metastasis. Pre and post contrast abdominal MRI (preferred) or CT could confirm. 4. Right-sided pleural thickening and trace pleural fluid. Right base airspace disease is favored to represent chronic atelectasis. 5. Aortic atherosclerosis (ICD10-I70.0) and emphysema (ICD10-J43.9).     08/11/2022 Genetic Testing   Negative genetic testing on the CancerNext-Expanded+RNAinsight panel.  The report date is August 11, 2022.  The CancerNext-Expanded gene panel offered by [facility] and includes sequencing and rearrangement analysis for the following 77 genes: AIP, ALK, APC*, ATM*, AXIN2, BAP1, BARD1, BLM, BMPR1A, BRCA1*, BRCA2*, BRIP1*, CDC73, CDH1*, CDK4, CDKN1B, CDKN2A, CHEK2*, CTNNA1, DICER1, FANCC, FH, FLCN, GALNT12, KIF1B, LZTR1, MAX, MEN1, MET, MLH1*, MSH2*, MSH3, MSH6*, MUTYH*, NBN, NF1*, NF2, NTHL1, PALB2*, PHOX2B, PMS2*, POT1, PRKAR1A, PTCH1, PTEN*, RAD51C*, RAD51D*, RB1, RECQL, RET, SDHA, SDHAF2, SDHB, SDHC, SDHD, SMAD4, SMARCA4, SMARCB1, SMARCE1, STK11, SUFU, TMEM127, TP53*, TSC1, TSC2, VHL and XRCC2 (sequencing and deletion/duplication); EGFR, EGLN1, HOXB13, KIT, MITF, PDGFRA, POLD1, and POLE (sequencing only); EPCAM and GREM1 (deletion/duplication only). DNA and RNA analyses performed for * genes.    Lobular carcinoma in situ (LCIS) of right breast  01/20/2020 Mammogram   Diagnostic Mammogram 01/20/20 IMPRESSION: 1.  Stable post lumpectomy changes of the left breast.   2. Suspicious microcalcifications over the right upper outer quadrant spanning 3.6 cm.   02/02/2020 Initial Biopsy   Diagnosis 02/02/20 Breast, right, needle core biopsy, upper outer quadrant, x clip - LOBULAR CARCINOMA IN SITU WITH PLEOMORPHIC FEATURES AND CALCIFICATIONS, INVOLVING ADENOSIS. SEE NOTE Diagnosis  Note Immunohistochemical stain for E-cadherin is negative in the lesional cells, consistent with a lobular phenotype. Immunostains for p63, SMM 1 and calponin do not show evidence of invasive carcinoma.    02/04/2020 Initial Diagnosis   Lobular carcinoma in situ (LCIS) of right breast   03/18/2020 Surgery   RIGHT BREAST LUMPECTOMY WITH RADIOACTIVE SEED LOCALIZATION by Dr Alvira Monday    03/18/2020 Pathology Results   FINAL MICROSCOPIC DIAGNOSIS:   A. BREAST, RIGHT, LUMPECTOMY:  - Pleomorphic lobular carcinoma in situ with calcifications and  underlying complex sclerosing lesion, adenosis and fibroadenomatoid  change.  - Margins of resection are not involved (Closest margins: < 1 mm,  anterior, posterior, inferior and medial).  - Biopsy site.    COMMENT:   P63, Calponin and SMM-1 demonstrate the presence of myoepithelium in the  select focus.    Metastatic breast cancer to bone and brain  05/04/2022 Imaging    IMPRESSION: Cervical spondylosis, as described.   Nonspecific straightening of the  expected cervical lordosis.   07/24/2022 Imaging    IMPRESSION: 1. Extensive osseous metastatic disease with pathologic fracture at the base of dens and C2 right lateral mass. Extraosseous tumor at C1 and C2 likely impinging on the right C2 and C3 nerve roots. 2. Degenerative cord impingement at C4-5 to C6-7. Biforaminal impingement at C5-6 and C6-7.   08/02/2022 Initial Diagnosis   Cancer, metastatic to bone ([name])   08/03/2022 PET scan    IMPRESSION: 1. Large volume osseous metastasis. 2. Low right cervical and probable right axillary nodal metastasis. 3. Subtle heterogeneous activity throughout the liver with suggestion of small liver lesions (likely new compared to chest CT of 07/16/2020). Findings are overall moderately suspicious for hepatic metastasis. Pre and post contrast abdominal MRI (preferred) or CT could confirm. 4. Right-sided pleural thickening and trace pleural fluid.  Right base airspace disease is favored to represent chronic atelectasis. 5. Aortic atherosclerosis (ICD10-I70.0) and emphysema (ICD10-J43.9).     10/26/2022 - 04/12/2023 Chemotherapy   Patient is on Treatment Plan : BREAST Paclitaxel D1,8,15 + Trastuzumab D1 + Pertuzumab D1 q21d x 8 cycles / Trastuzumab D1 + Pertuzumab D1 q21d x 4 cycles     05/03/2023 -  Chemotherapy   Patient is on Treatment Plan : BREAST MAINTENANCE Trastuzumab IV (6) or SQ (600) D1 q21d X 11 Cycles         REVIEW OF SYSTEMS:   Constitutional: Denies fevers, chills or abnormal weight loss Eyes: Denies blurriness of vision Ears, nose, mouth, throat, and face: Denies mucositis or sore throat Respiratory: Denies cough, dyspnea or wheezes Cardiovascular: Denies palpitation, chest discomfort or lower extremity swelling Gastrointestinal:  Denies nausea, heartburn or change in bowel habits Skin: Denies abnormal skin rashes Lymphatics: Denies new lymphadenopathy or easy bruising Neurological:Denies numbness, tingling or new weaknesses Behavioral/Psych: Mood is stable, no new changes  All other systems were reviewed with the patient and are negative.   VITALS:   Today's Vitals   09/27/23 0859 09/27/23 0906  BP:  (!) 106/54  Pulse:  82  Temp:  98.2 F (36.8 C)  TempSrc:  Temporal  SpO2:  100%  Weight:  74 lb 8 oz (33.8 kg)  Height:  5\' 2"  (1.575 m)  PainSc: 0-No pain    Body mass index is 13.63 kg/m.   Wt Readings from Last 3 Encounters:  09/27/23 74 lb 8 oz (33.8 kg)  09/06/23 73 lb 14.4 oz (33.5 kg)  09/04/23 74 lb 4.8 oz (33.7 kg)    Body mass index is 13.63 kg/m.  Performance status (ECOG): 1 - Symptomatic but completely ambulatory  PHYSICAL EXAM:   GENERAL:alert, no distress and comfortable SKIN: skin color, texture, turgor are normal, no rashes or significant lesions EYES: normal, Conjunctiva are pink and non-injected, sclera clear OROPHARYNX:no exudate, no erythema and lips, buccal mucosa, and  tongue normal  NECK: supple, thyroid normal size, non-tender, without nodularity LYMPH:  no palpable lymphadenopathy in the cervical, axillary or inguinal LUNGS: clear to auscultation and percussion with normal breathing effort HEART: regular rate & rhythm and no murmurs and no lower extremity edema ABDOMEN:abdomen soft, non-tender and normal bowel sounds Musculoskeletal:no cyanosis of digits and no clubbing  NEURO: alert & oriented x 3 with fluent speech, no focal motor/sensory deficits  LABORATORY DATA:  I have reviewed the data as listed    Component Value Date/Time   NA 138 09/27/2023 0805   K 4.2 09/27/2023 0805   CL 103 09/27/2023 0805   CO2 32 09/27/2023 0805  GLUCOSE 103 (H) 09/27/2023 0805   BUN 15 09/27/2023 0805   CREATININE 0.40 (L) 09/27/2023 0805   CALCIUM 9.5 09/27/2023 0805   PROT 6.6 09/27/2023 0805   ALBUMIN 4.2 09/27/2023 0805   AST 18 09/27/2023 0805   ALT 12 09/27/2023 0805   ALKPHOS 108 09/27/2023 0805   BILITOT 0.4 09/27/2023 0805   GFRNONAA >60 09/27/2023 0805   GFRAA >60 05/21/2020 1350    Lab Results  Component Value Date   WBC 5.8 09/27/2023   NEUTROABS 4.3 09/27/2023   HGB 15.0 09/27/2023   HCT 43.8 09/27/2023   MCV 99.5 09/27/2023   PLT 217 09/27/2023

## 2023-09-27 ENCOUNTER — Telehealth: Payer: Self-pay | Admitting: *Deleted

## 2023-09-27 ENCOUNTER — Inpatient Hospital Stay: Payer: BC Managed Care – PPO

## 2023-09-27 ENCOUNTER — Telehealth: Payer: Self-pay | Admitting: Pharmacy Technician

## 2023-09-27 ENCOUNTER — Other Ambulatory Visit (HOSPITAL_COMMUNITY): Payer: Self-pay

## 2023-09-27 ENCOUNTER — Inpatient Hospital Stay (HOSPITAL_BASED_OUTPATIENT_CLINIC_OR_DEPARTMENT_OTHER): Payer: BC Managed Care – PPO | Admitting: Nurse Practitioner

## 2023-09-27 VITALS — BP 124/58 | HR 80 | Resp 14

## 2023-09-27 VITALS — BP 106/54 | HR 82 | Temp 98.2°F | Ht 62.0 in | Wt 74.5 lb

## 2023-09-27 DIAGNOSIS — Z923 Personal history of irradiation: Secondary | ICD-10-CM | POA: Diagnosis not present

## 2023-09-27 DIAGNOSIS — Z9221 Personal history of antineoplastic chemotherapy: Secondary | ICD-10-CM | POA: Diagnosis not present

## 2023-09-27 DIAGNOSIS — C7951 Secondary malignant neoplasm of bone: Secondary | ICD-10-CM

## 2023-09-27 DIAGNOSIS — Z95828 Presence of other vascular implants and grafts: Secondary | ICD-10-CM

## 2023-09-27 DIAGNOSIS — Z5112 Encounter for antineoplastic immunotherapy: Secondary | ICD-10-CM | POA: Diagnosis not present

## 2023-09-27 DIAGNOSIS — C787 Secondary malignant neoplasm of liver and intrahepatic bile duct: Secondary | ICD-10-CM | POA: Diagnosis not present

## 2023-09-27 DIAGNOSIS — C50312 Malignant neoplasm of lower-inner quadrant of left female breast: Secondary | ICD-10-CM | POA: Diagnosis not present

## 2023-09-27 DIAGNOSIS — Z170 Estrogen receptor positive status [ER+]: Secondary | ICD-10-CM

## 2023-09-27 DIAGNOSIS — C7931 Secondary malignant neoplasm of brain: Secondary | ICD-10-CM | POA: Diagnosis not present

## 2023-09-27 DIAGNOSIS — Z79899 Other long term (current) drug therapy: Secondary | ICD-10-CM | POA: Diagnosis not present

## 2023-09-27 LAB — CBC WITH DIFFERENTIAL (CANCER CENTER ONLY)
Abs Immature Granulocytes: 0.01 10*3/uL (ref 0.00–0.07)
Basophils Absolute: 0 10*3/uL (ref 0.0–0.1)
Basophils Relative: 1 %
Eosinophils Absolute: 0.1 10*3/uL (ref 0.0–0.5)
Eosinophils Relative: 1 %
HCT: 43.8 % (ref 36.0–46.0)
Hemoglobin: 15 g/dL (ref 12.0–15.0)
Immature Granulocytes: 0 %
Lymphocytes Relative: 15 %
Lymphs Abs: 0.9 10*3/uL (ref 0.7–4.0)
MCH: 34.1 pg — ABNORMAL HIGH (ref 26.0–34.0)
MCHC: 34.2 g/dL (ref 30.0–36.0)
MCV: 99.5 fL (ref 80.0–100.0)
Monocytes Absolute: 0.5 10*3/uL (ref 0.1–1.0)
Monocytes Relative: 8 %
Neutro Abs: 4.3 10*3/uL (ref 1.7–7.7)
Neutrophils Relative %: 75 %
Platelet Count: 217 10*3/uL (ref 150–400)
RBC: 4.4 MIL/uL (ref 3.87–5.11)
RDW: 15.9 % — ABNORMAL HIGH (ref 11.5–15.5)
WBC Count: 5.8 10*3/uL (ref 4.0–10.5)
nRBC: 0 % (ref 0.0–0.2)

## 2023-09-27 LAB — CMP (CANCER CENTER ONLY)
ALT: 12 U/L (ref 0–44)
AST: 18 U/L (ref 15–41)
Albumin: 4.2 g/dL (ref 3.5–5.0)
Alkaline Phosphatase: 108 U/L (ref 38–126)
Anion gap: 3 — ABNORMAL LOW (ref 5–15)
BUN: 15 mg/dL (ref 8–23)
CO2: 32 mmol/L (ref 22–32)
Calcium: 9.5 mg/dL (ref 8.9–10.3)
Chloride: 103 mmol/L (ref 98–111)
Creatinine: 0.4 mg/dL — ABNORMAL LOW (ref 0.44–1.00)
GFR, Estimated: >60 mL/min (ref >60)
Glucose, Bld: 103 mg/dL — ABNORMAL HIGH (ref 70–99)
Potassium: 4.2 mmol/L (ref 3.5–5.1)
Sodium: 138 mmol/L (ref 135–145)
Total Bilirubin: 0.4 mg/dL (ref 0.0–1.2)
Total Protein: 6.6 g/dL (ref 6.5–8.1)

## 2023-09-27 MED ORDER — TRASTUZUMAB-ANNS CHEMO 150 MG IV SOLR
6.0000 mg/kg | Freq: Once | INTRAVENOUS | Status: AC
  Administered 2023-09-27: 210 mg via INTRAVENOUS
  Filled 2023-09-27: qty 10

## 2023-09-27 MED ORDER — SODIUM CHLORIDE 0.9% FLUSH
10.0000 mL | Freq: Once | INTRAVENOUS | Status: AC
  Administered 2023-09-27: 10 mL

## 2023-09-27 MED ORDER — SODIUM CHLORIDE 0.9 % IV SOLN: Freq: Once | INTRAVENOUS | Status: AC

## 2023-09-27 MED ORDER — HEPARIN SOD (PORK) LOCK FLUSH 100 UNIT/ML IV SOLN
500.0000 [IU] | Freq: Once | INTRAVENOUS | Status: AC | PRN
  Administered 2023-09-27: 500 [IU]

## 2023-09-27 MED ORDER — DIPHENHYDRAMINE HCL 25 MG PO CAPS
50.0000 mg | ORAL_CAPSULE | Freq: Once | ORAL | Status: AC
  Administered 2023-09-27: 50 mg via ORAL
  Filled 2023-09-27: qty 2

## 2023-09-27 MED ORDER — ACETAMINOPHEN 325 MG PO TABS
650.0000 mg | ORAL_TABLET | Freq: Once | ORAL | Status: AC
Start: 2023-09-27 — End: 2023-09-27
  Administered 2023-09-27: 650 mg via ORAL
  Filled 2023-09-27: qty 2

## 2023-09-27 MED ORDER — SODIUM CHLORIDE 0.9% FLUSH
10.0000 mL | INTRAVENOUS | Status: DC | PRN
  Administered 2023-09-27: 10 mL

## 2023-09-27 MED ORDER — ZOLEDRONIC ACID 4 MG/100ML IV SOLN
4.0000 mg | Freq: Once | INTRAVENOUS | Status: AC
  Administered 2023-09-27: 4 mg via INTRAVENOUS
  Filled 2023-09-27: qty 100

## 2023-09-27 NOTE — Telephone Encounter (Signed)
Message left for Martha [name], [facility]), [phone] (home) to stop by forms office [phone] to sign ROI, [facility] Cover sheet and drop off husband's FMLA form or for further discussion of needs with other form staff.  Secure Chat received to speak with you however this nurse working remotely is unable to assist.  Return phone numbers provided for form staff.

## 2023-09-27 NOTE — Telephone Encounter (Signed)
Oral Oncology Patient Advocate Encounter  Patient called back to notify her Martha [name] is now scheduled for delivery 09/28/23.  Jinger Neighbors, CPhT-Adv Oncology Pharmacy Patient Advocate [facility] Direct Number: [phone]  Fax: [phone]

## 2023-09-27 NOTE — Telephone Encounter (Signed)
Oral Oncology Patient Advocate Encounter  Spoke with the patient and confirmed she has a shipment of capecitabine scheduled to be sent from [facility] 10/02/23.   Patient had a letter from [employer] confirming her continued eligibility in their copay assistance program for her Matilde Haymaker. I advised her this was not a notification of a refill simply a confirmation she still has coverage for her copay on Tukysa. I provided her with the phone number for [facility] pharmacy to call and order her next refill of Tukysa.  Patient's husband was also present during the call. I provided my phone number to call back if there are any issues scheduling the refill of Tukysa.  Jinger Neighbors, CPhT-Adv Oncology Pharmacy Patient Advocate [facility] Direct Number: [phone]  Fax: [phone]

## 2023-09-27 NOTE — Patient Instructions (Signed)
[facility] - A DEPT OF [facility]  Discharge Instructions: Thank you for choosing [facility] to provide your oncology and hematology care.   If you have a lab appointment with the Cancer Center, please go directly to the Cancer Center and check in at the registration area.   Wear comfortable clothing and clothing appropriate for easy access to any Portacath or PICC line.   We strive to give you quality time with your provider. You may need to reschedule your appointment if you arrive late (15 or more minutes).  Arriving late affects you and other patients whose appointments are after yours.  Also, if you miss three or more appointments without notifying the office, you may be dismissed from the clinic at the provider's discretion.      For prescription refill requests, have your pharmacy contact our office and allow 72 hours for refills to be completed.    Today you received the following chemotherapy and/or immunotherapy agents: Kanjinti      To help prevent nausea and vomiting after your treatment, we encourage you to take your nausea medication as directed.  BELOW ARE SYMPTOMS THAT SHOULD BE REPORTED IMMEDIATELY: *FEVER GREATER THAN 100.4 F (38 C) OR HIGHER *CHILLS OR SWEATING *NAUSEA AND VOMITING THAT IS NOT CONTROLLED WITH YOUR NAUSEA MEDICATION *UNUSUAL SHORTNESS OF BREATH *UNUSUAL BRUISING OR BLEEDING *URINARY PROBLEMS (pain or burning when urinating, or frequent urination) *BOWEL PROBLEMS (unusual diarrhea, constipation, pain near the anus) TENDERNESS IN MOUTH AND THROAT WITH OR WITHOUT PRESENCE OF ULCERS (sore throat, sores in mouth, or a toothache) UNUSUAL RASH, SWELLING OR PAIN  UNUSUAL VAGINAL DISCHARGE OR ITCHING   Items with * indicate a potential emergency and should be followed up as soon as possible or go to the Emergency Department if any problems should occur.  Please show the CHEMOTHERAPY ALERT CARD or IMMUNOTHERAPY  ALERT CARD at check-in to the Emergency Department and triage nurse.  Should you have questions after your visit or need to cancel or reschedule your appointment, please contact [facility] - A DEPT OF [facility]  Dept: [phone]  and follow the prompts.  Office hours are 8:00 a.m. to 4:30 p.m. Monday - Friday. Please note that voicemails left after 4:00 p.m. may not be returned until the following business day.  We are closed weekends and major holidays. You have access to a nurse at all times for urgent questions. Please call the main number to the clinic Dept: [phone] and follow the prompts.   For any non-urgent questions, you may also contact your provider using MyChart. We now offer e-Visits for anyone 46 and older to request care online for non-urgent symptoms. For details visit [email or web address].   Also download the MyChart app! Go to the app store, search "MyChart", open the app, select [place], and log in with your MyChart username and password.

## 2023-09-30 ENCOUNTER — Encounter: Payer: Self-pay | Admitting: Nurse Practitioner

## 2023-09-30 ENCOUNTER — Encounter: Payer: Self-pay | Admitting: Hematology

## 2023-10-07 ENCOUNTER — Other Ambulatory Visit: Payer: Self-pay | Admitting: Nurse Practitioner

## 2023-10-07 DIAGNOSIS — Z515 Encounter for palliative care: Secondary | ICD-10-CM

## 2023-10-07 DIAGNOSIS — R197 Diarrhea, unspecified: Secondary | ICD-10-CM

## 2023-10-07 DIAGNOSIS — M792 Neuralgia and neuritis, unspecified: Secondary | ICD-10-CM

## 2023-10-07 DIAGNOSIS — C7951 Secondary malignant neoplasm of bone: Secondary | ICD-10-CM

## 2023-10-07 MED ORDER — PREGABALIN 25 MG PO CAPS: 25.0000 mg | ORAL_CAPSULE | Freq: Two times a day (BID) | ORAL | 2 refills | Status: DC

## 2023-10-07 MED ORDER — DIPHENOXYLATE-ATROPINE 2.5-0.025 MG PO TABS: 1.0000 | ORAL_TABLET | Freq: Four times a day (QID) | ORAL | 0 refills | Status: DC | PRN

## 2023-10-07 MED ORDER — LOPERAMIDE HCL 2 MG PO TABS: 2.0000 mg | ORAL_TABLET | Freq: Four times a day (QID) | ORAL | 0 refills | Status: AC | PRN

## 2023-10-11 ENCOUNTER — Ambulatory Visit: Payer: BC Managed Care – PPO

## 2023-10-11 ENCOUNTER — Encounter: Payer: Self-pay | Admitting: Hematology

## 2023-10-11 ENCOUNTER — Ambulatory Visit: Payer: BC Managed Care – PPO | Admitting: Hematology

## 2023-10-11 ENCOUNTER — Other Ambulatory Visit: Payer: BC Managed Care – PPO

## 2023-10-12 ENCOUNTER — Telehealth: Payer: Self-pay

## 2023-10-12 NOTE — Telephone Encounter (Signed)
Notified Patient of completion of FMLA forms for Spouse as requested. Copy of forms placed for pick-up as requested. No other needs or concerns noted at this time.

## 2023-10-12 NOTE — Progress Notes (Signed)
Palliative Medicine [facility]  [phone] [phone]   Name: Martha [name] Date: 10/12/2023 MRN: [id number]  DOB: 10/02/1960  Patient Care Team: Ardith Dark, MD as PCP - General (Family Medicine) Almond Lint, MD as Consulting Physician (General Surgery) Malachy Mood, MD as Consulting Physician (Hematology) Lonie Peak, MD as Attending Physician (Radiation Oncology) Pollyann Samples, NP as Nurse Practitioner (Nurse Practitioner)    INTERVAL HISTORY: Martha [name] is a 63 y.o. female with oncologic medical history including ER+ breast cancer (01/2018), right breast cancer (04/2021), now with metastatic disease progression involving brain and liver, pathological fractures with osseous mets s/p brain radiation. .  Palliative ask to see for symptom management and goals of care.   SOCIAL HISTORY:    Martha [name] reports that she has been smoking cigarettes. She has a 20 pack-year smoking history. She has never used smokeless tobacco. She reports that she does not currently use alcohol after a past usage of about 28.0 standard drinks of alcohol per week. She reports that she does not use drugs.  ADVANCE DIRECTIVES:  Has documents on file  CODE STATUS: DNR  PAST MEDICAL HISTORY: Past Medical History:  Diagnosis Date   Anxiety    Cancer ([name]) 2022   right breast LCIS   Cancer ([name]) 2019   left breast   Depression    Dysrhythmia    SVT, s/p ablation ~ 2012 in at [facility]   Ectopic pregnancy    Family history of breast cancer    Family history of melanoma    History of radiation therapy 04/22/18-06/03/18   Left Breast, left SCV, axilla 50 Gy in 25 fractions, Left breast boost 10 Gy in 5 fractions.    Hypothyroidism    Personal history of radiation therapy    PONV (postoperative nausea and vomiting)    Thyroid disease     ALLERGIES:  is allergic to morphine and codeine.  MEDICATIONS:  Current Outpatient  Medications  Medication Sig Dispense Refill   acetaminophen (TYLENOL) 500 MG tablet Take 2 tablets (1,000 mg total) by mouth every 8 (eight) hours. 30 tablet 0   capecitabine (XELODA) 500 MG tablet TAKE 2 TABLETS TWICE DAILY FOR 7 DAYS ON THEN 7 DAYS OFF. TAKE AFTER MEALS 168 tablet 0   citalopram (CELEXA) 40 MG tablet TAKE 1 TABLET BY MOUTH EVERY DAY 90 tablet 2   diphenoxylate-atropine (LOMOTIL) 2.5-0.025 MG tablet Take 1 tablet by mouth 4 (four) times daily as needed for diarrhea or loose stools. 90 tablet 0   levothyroxine (SYNTHROID) 75 MCG tablet Take 1 tablet (75 mcg total) by mouth daily before breakfast. 30 tablet 0   lidocaine (XYLOCAINE) 2 % solution Patient: Mix 1part 2% viscous lidocaine, 1part H20. Swallow 10mL of diluted mixture, before meals and at bedtime, up to QID 100 mL 3   loperamide (IMODIUM A-D) 2 MG tablet Take 1 tablet (2 mg total) by mouth 4 (four) times daily as needed for diarrhea or loose stools. 90 tablet 0   LORazepam (ATIVAN) 0.5 MG tablet Take 1 tablet (0.5 mg total) by mouth every 6 (six) hours as needed for anxiety (agitation). 30 tablet 0   Mouthwashes (MOUTH RINSE) LIQD solution 15 mLs by Mouth Rinse route as needed (for oral care).  0   nicotine (NICODERM CQ - DOSED IN MG/24 HR) 7 mg/24hr patch Place 1 patch (7 mg total) onto the skin daily. 28 patch 0   ondansetron (ZOFRAN)  8 MG tablet Take 1 tablet (8 mg total) by mouth every 8 (eight) hours as needed for nausea or vomiting. 20 tablet 1   OVER THE COUNTER MEDICATION Take 1 tablet by mouth daily. Protandim Supplement     oxyCODONE (OXY IR/ROXICODONE) 5 MG immediate release tablet Take 0.5-1 tablets (2.5-5 mg total) by mouth every 4 (four) hours as needed for moderate pain (pain score 4-6) or severe pain (pain score 7-10) (dyspnea). 30 tablet 0   pregabalin (LYRICA) 25 MG capsule Take 1 capsule (25 mg total) by mouth 2 (two) times daily. 60 capsule 2   senna-docusate (SENOKOT-S) 8.6-50 MG tablet Take 1  tablet by mouth 2 (two) times daily. 60 tablet 0   tucatinib (TUKYSA) 150 MG tablet Take 2 tablets (300 mg total) by mouth 2 (two) times daily. Take as instructed per MD. 120 tablet 2   No current facility-administered medications for this visit.    VITAL SIGNS: There were no vitals taken for this visit. There were no vitals filed for this visit.  Estimated body mass index is 13.63 kg/m as calculated from the following:   Height as of 09/27/23: 5\' 2"  (1.575 m).   Weight as of 09/27/23: 74 lb 8 oz (33.8 kg).   PERFORMANCE STATUS (ECOG) : 3 - Symptomatic, >50% confined to bed   Physical Exam General: NAD, thin   Cardiovascular: regular rate and rhythm Pulmonary: normal breathing pattern Abdomen: soft, nontender, + bowel sounds Extremities: no edema, no joint deformities Skin: no rashes, muscle wasting  Neurological: alert, oriented x 4  IMPRESSION:  I saw Martha [name] during her infusion. No acute distress. She is doing well overall. Her husband is present. Shares they have moved into their new home. She is appreciative of how well she is feeling as this has allowed her to do some things around the house. No concerns with nausea, vomiting, constipation. Occasional diarrhea which is controlled with Imodium or Lomotil. Her appetite is improving although she is concerned with no significant weight gain. Encouraged patient to continue eating as much as possible.   Denies pain or discomfort. What discomfort she does have which is neuropathic in nature is well controlled with Lyrica. She takes Tylenol as needed for minor aches. They have installed grab bars in the shower and she has been able to shower independently. Looking forward to spending some time outside as weather gets warmer. Martha [name] is remaining active. They have upstairs in their home although her living quarters is downstairs. She will often climb he stairs specifically for strengthening and exercise.   We will continue to support and  follow as needed.   PLAN: Continue intake of Ensure and soft, high-calorie foods. Continue Lyrica 25 mg twice daily. Tylenol as needed for mild aches and pain.  Ongoing goals of care discussions and symptom management as needed. Palliative will plan to see patient back in 4-6 weeks in collaboration to other oncology appointments.  Patient and husband knows to contact office sooner if needed.  Patient expressed understanding and was in agreement with this plan. She also understands that She can call the clinic at any time with any questions, concerns, or complaints.      Any controlled substances utilized were prescribed in the context of palliative care. PDMP has been reviewed.   Visit consisted of counseling and education dealing with the complex and emotionally intense issues of symptom management and palliative care in the setting of serious and potentially life-threatening illness.  Willette Alma, AGPCNP-BC  Palliative Medicine Team/[facility]

## 2023-10-17 NOTE — Assessment & Plan Note (Signed)
-  Diagnosed in 06/2022, ER-/PR-/HER2+ --PET scan from 08/03/2022 showed diffuse bone mets  -she underwent cervical laminectomy and fusion by Dr. Maisie Fus on 08/17/2022, biopsy confirmed metastatic breast cancer, ER/PR negative, HER2 positive. -due to cervical cord compression with myelopathy, she underwent a second cervical spine surgery on September 06, 2022, and completed inpatient rehabitation. -She has completed brain, cervical and lumbar spine radiation in Feb 2024 -She was recently hospitalized for altered mental status and confusion, probably secondary to medication -She started first line systemic chemotherapy with weekly Taxol and trastuzumab/Perjeta on 10/25/22. She has tolerated well so far and her pain has improved overall -PET scan from Jan 01, 2023 showed complete metabolic response, no hypermetabolic uptake in diffuse bone lesions, no other new lesions.  She is clinically also doing much better.  Given her excellent response to chemo, we will change her Taxol to 2 weeks on and 1 week off -She Unfortunately developed more more brain metastasis on recent brain MRI. -I personally reviewed her restaging PET scan from April 13, 2023, which showed stable disease, no new lesions. -Due to her new brain metastasis, I changed her therapy to Tucatinib, capecitabine and trastuzumab, for better brain penetration. She started in late August 2024, tolerating well so far.  Her case was reviewed in CNS tumor board, Dr. Basilio Cairo plan to hold on whole brain radiation since she is starting new Her2 antibody and monitor her brain mets closely.  -restaging MRI brian 08/30/2023 showed partial response, PET in 06/2023 showed no hypermetabolic lesions  -she is also on Zometa every 3 months

## 2023-10-18 ENCOUNTER — Inpatient Hospital Stay: Payer: BC Managed Care – PPO

## 2023-10-18 ENCOUNTER — Inpatient Hospital Stay (HOSPITAL_BASED_OUTPATIENT_CLINIC_OR_DEPARTMENT_OTHER): Payer: BC Managed Care – PPO | Admitting: Nurse Practitioner

## 2023-10-18 ENCOUNTER — Inpatient Hospital Stay: Payer: BC Managed Care – PPO | Attending: Genetic Counselor

## 2023-10-18 ENCOUNTER — Inpatient Hospital Stay (HOSPITAL_BASED_OUTPATIENT_CLINIC_OR_DEPARTMENT_OTHER): Payer: BC Managed Care – PPO | Admitting: Hematology

## 2023-10-18 ENCOUNTER — Encounter: Payer: Self-pay | Admitting: Nurse Practitioner

## 2023-10-18 ENCOUNTER — Encounter: Payer: Self-pay | Admitting: Hematology

## 2023-10-18 VITALS — BP 129/62 | HR 89 | Temp 97.9°F | Resp 16 | Wt 74.7 lb

## 2023-10-18 DIAGNOSIS — C7951 Secondary malignant neoplasm of bone: Secondary | ICD-10-CM

## 2023-10-18 DIAGNOSIS — Z1722 Progesterone receptor negative status: Secondary | ICD-10-CM | POA: Diagnosis not present

## 2023-10-18 DIAGNOSIS — Z171 Estrogen receptor negative status [ER-]: Secondary | ICD-10-CM | POA: Diagnosis not present

## 2023-10-18 DIAGNOSIS — C787 Secondary malignant neoplasm of liver and intrahepatic bile duct: Secondary | ICD-10-CM | POA: Insufficient documentation

## 2023-10-18 DIAGNOSIS — C7931 Secondary malignant neoplasm of brain: Secondary | ICD-10-CM | POA: Diagnosis not present

## 2023-10-18 DIAGNOSIS — Z1732 Human epidermal growth factor receptor 2 negative status: Secondary | ICD-10-CM | POA: Diagnosis not present

## 2023-10-18 DIAGNOSIS — Z95828 Presence of other vascular implants and grafts: Secondary | ICD-10-CM

## 2023-10-18 DIAGNOSIS — C799 Secondary malignant neoplasm of unspecified site: Secondary | ICD-10-CM

## 2023-10-18 DIAGNOSIS — Z515 Encounter for palliative care: Secondary | ICD-10-CM

## 2023-10-18 DIAGNOSIS — C50312 Malignant neoplasm of lower-inner quadrant of left female breast: Secondary | ICD-10-CM | POA: Diagnosis not present

## 2023-10-18 DIAGNOSIS — Z5112 Encounter for antineoplastic immunotherapy: Secondary | ICD-10-CM | POA: Diagnosis not present

## 2023-10-18 DIAGNOSIS — R634 Abnormal weight loss: Secondary | ICD-10-CM

## 2023-10-18 DIAGNOSIS — R530 Neoplastic (malignant) related fatigue: Secondary | ICD-10-CM

## 2023-10-18 DIAGNOSIS — R197 Diarrhea, unspecified: Secondary | ICD-10-CM

## 2023-10-18 LAB — CMP (CANCER CENTER ONLY)
ALT: 15 U/L (ref 0–44)
AST: 27 U/L (ref 15–41)
Albumin: 4.4 g/dL (ref 3.5–5.0)
Alkaline Phosphatase: 99 U/L (ref 38–126)
Anion gap: 2 — ABNORMAL LOW (ref 5–15)
BUN: 14 mg/dL (ref 8–23)
CO2: 33 mmol/L — ABNORMAL HIGH (ref 22–32)
Calcium: 9.5 mg/dL (ref 8.9–10.3)
Chloride: 102 mmol/L (ref 98–111)
Creatinine: 0.42 mg/dL — ABNORMAL LOW (ref 0.44–1.00)
GFR, Estimated: >60 mL/min (ref >60)
Glucose, Bld: 93 mg/dL (ref 70–99)
Potassium: 4.2 mmol/L (ref 3.5–5.1)
Sodium: 137 mmol/L (ref 135–145)
Total Bilirubin: 0.7 mg/dL (ref 0.0–1.2)
Total Protein: 6.6 g/dL (ref 6.5–8.1)

## 2023-10-18 LAB — CBC WITH DIFFERENTIAL (CANCER CENTER ONLY)
Abs Immature Granulocytes: 0.02 10*3/uL (ref 0.00–0.07)
Basophils Absolute: 0 10*3/uL (ref 0.0–0.1)
Basophils Relative: 0 %
Eosinophils Absolute: 0 10*3/uL (ref 0.0–0.5)
Eosinophils Relative: 1 %
HCT: 44.5 % (ref 36.0–46.0)
Hemoglobin: 15.2 g/dL — ABNORMAL HIGH (ref 12.0–15.0)
Immature Granulocytes: 0 %
Lymphocytes Relative: 12 %
Lymphs Abs: 0.9 10*3/uL (ref 0.7–4.0)
MCH: 35.2 pg — ABNORMAL HIGH (ref 26.0–34.0)
MCHC: 34.2 g/dL (ref 30.0–36.0)
MCV: 103 fL — ABNORMAL HIGH (ref 80.0–100.0)
Monocytes Absolute: 0.6 10*3/uL (ref 0.1–1.0)
Monocytes Relative: 8 %
Neutro Abs: 5.7 10*3/uL (ref 1.7–7.7)
Neutrophils Relative %: 79 %
Platelet Count: 217 10*3/uL (ref 150–400)
RBC: 4.32 MIL/uL (ref 3.87–5.11)
RDW: 17.1 % — ABNORMAL HIGH (ref 11.5–15.5)
WBC Count: 7.3 10*3/uL (ref 4.0–10.5)
nRBC: 0 % (ref 0.0–0.2)

## 2023-10-18 MED ORDER — ACETAMINOPHEN 325 MG PO TABS
650.0000 mg | ORAL_TABLET | Freq: Once | ORAL | Status: AC
  Administered 2023-10-18: 650 mg via ORAL
  Filled 2023-10-18: qty 2

## 2023-10-18 MED ORDER — SODIUM CHLORIDE 0.9 % IV SOLN
6.0000 mg/kg | Freq: Once | INTRAVENOUS | Status: AC
  Administered 2023-10-18: 210 mg via INTRAVENOUS
  Filled 2023-10-18: qty 10

## 2023-10-18 MED ORDER — SODIUM CHLORIDE 0.9% FLUSH
10.0000 mL | Freq: Once | INTRAVENOUS | Status: AC
  Administered 2023-10-18: 10 mL

## 2023-10-18 MED ORDER — SODIUM CHLORIDE 0.9 % IV SOLN
Freq: Once | INTRAVENOUS | Status: AC
Start: 2023-10-18 — End: 2023-10-18

## 2023-10-18 MED ORDER — DIPHENHYDRAMINE HCL 25 MG PO CAPS
50.0000 mg | ORAL_CAPSULE | Freq: Once | ORAL | Status: AC
  Administered 2023-10-18: 50 mg via ORAL
  Filled 2023-10-18: qty 2

## 2023-10-18 MED ORDER — CAPECITABINE 500 MG PO TABS: ORAL_TABLET | ORAL | 2 refills | Status: DC

## 2023-10-18 NOTE — Progress Notes (Signed)
[facility]   [phone] [phone]   Clinic Follow up Note   Patient Care Team: Ardith Dark, MD as PCP - General (Family Medicine) Almond Lint, MD as Consulting Physician (General Surgery) Malachy Mood, MD as Consulting Physician (Hematology) Lonie Peak, MD as Attending Physician (Radiation Oncology) Pollyann Samples, NP as Nurse Practitioner (Nurse Practitioner)  Date of Service:  10/18/2023  CHIEF COMPLAINT: f/u of breast cancer  CURRENT THERAPY:  Trastuzumab every 3 weeks, Xeloda and Tucatinib   Oncology History   Metastatic breast cancer to bone and brain -Diagnosed in 06/2022, ER-/PR-/HER2+ --PET scan from 08/03/2022 showed diffuse bone mets  -she underwent cervical laminectomy and fusion by Dr. Maisie Fus on 08/17/2022, biopsy confirmed metastatic breast cancer, ER/PR negative, HER2 positive. -due to cervical cord compression with myelopathy, she underwent a second cervical spine surgery on September 06, 2022, and completed inpatient rehabitation. -She has completed brain, cervical and lumbar spine radiation in Feb 2024 -She was recently hospitalized for altered mental status and confusion, probably secondary to medication -She started first line systemic chemotherapy with weekly Taxol and trastuzumab/Perjeta on 10/25/22. She has tolerated well so far and her pain has improved overall -PET scan from Jan 01, 2023 showed complete metabolic response, no hypermetabolic uptake in diffuse bone lesions, no other new lesions.  She is clinically also doing much better.  Given her excellent response to chemo, we will change her Taxol to 2 weeks on and 1 week off -She Unfortunately developed more more brain metastasis on recent brain MRI. -I personally reviewed her restaging PET scan from April 13, 2023, which showed stable disease, no new lesions. -Due to her new brain metastasis, I changed her therapy to Tucatinib, capecitabine and trastuzumab, for  better brain penetration. She started in late August 2024, tolerating well so far.  Her case was reviewed in CNS tumor board, Dr. Basilio Cairo plan to hold on whole brain radiation since she is starting new Her2 antibody and monitor her brain mets closely.  -restaging MRI brian 08/30/2023 showed partial response, PET in 06/2023 showed no hypermetabolic lesions  -she is also on Zometa every 3 months    Assessment and Plan    Metastatic Breast Cancer with brain and bone metastasis 63 year old female with metastatic breast cancer on Xeloda (capecitabine) and tucatinib. Reports improved appetite, increased physical activity, and no recent falls. Mild back pain managed with occasional Tylenol. No significant side effects from Xeloda, except for a bruise from thin skin. Brain MRI in January showed stable disease with some spots potentially being scars. Last PET scan in November was stable. Discussed treatable nature of her condition, positively impacting her outlook and adherence to treatment. - Continue Xeloda (capecitabine) 500 mg, two tablets twice a day, one week on, one week off - Refill Xeloda prescription with two weeks supply and two refills - Continue tucatinib twice a day - Take vitamin C to strengthen blood vessels - Encourage weight gain through diet - Order PET scan in April - Schedule follow-up appointment in two months - Infusion scheduled for 12:15 PM today  General Health Maintenance Maintaining good physical activity and balance, performing daily activities such as laundry, and no recent falls. Eating well and reports overall improvement in condition. - Encourage continued physical activity and safe practices to prevent falls - Monitor weight and encourage a balanced diet to support overall health  Plan -Lab reviewed, adequate for treatment, will proceed trastuzumab today and continue every 3 weeks -She will continue Xeloda  and Tucatinib at home at the same dose -Follow-up in 6 weeks  with PET scan 1 week before        SUMMARY OF ONCOLOGIC HISTORY: Oncology History Overview Note  Cancer Staging Malignant neoplasm of lower-inner quadrant of left breast in female, estrogen receptor positive ([name]) Staging form: Breast, AJCC 8th Edition - Clinical stage from 01/16/2018: Stage IA (cT1c, cN0, cM0, G2, ER+, PR+, HER2-) - Signed by Malachy Mood, MD on 01/23/2018 - Pathologic: Stage IA (pT1c, pN1, cM0, G1, ER+, PR+, HER2-) - Signed by Lonie Peak, MD on 04/09/2018     Malignant neoplasm of lower-inner quadrant of left breast in female, estrogen receptor positive ([name])  01/15/2018 Mammogram   Diagnositc Mammogram 01/15/18  IMPRESSION: 1. Suspicious 1.2 x 1.4 x 1.3 cm mixed echogenicity mass left breast 7 o'clock position retroareolar location at the site of palpable concern.. 2. Indeterminate Within the left breast 7:30 o'clock retroareolar location, adjacent to the palpable mass, is a 0.5 x 0.4 x 0.5 cm oval circumscribed hypoechoic mass. 3. Indeterminate calcifications within the lateral left breast. Location of these calcifications is not definitely confirmed on the true lateral view.    01/16/2018 Initial Biopsy   Diagnosis 01/16/18 1. Breast, left, needle core biopsy, 7:30 o'clock (ribbon clip) - FIBROCYSTIC CHANGES WITH SCLEROSING ADENOSIS AND CALCIFICATIONS. - FIBROADENOMATOID CHANGE. - NO MALIGNANCY IDENTIFIED. 2. Breast, left, needle core biopsy, 7 o'clock position (coil clip) - INVASIVE MAMMARY CARCINOMA, MSBR GRADE I/II. - SEE MICROSCOPIC DESCRIPTION Microscopic Comment  ADDENDUM: Immunohistochemistry for E-Cadherin is strongly positive in the tumor consistent with ductal carcinoma. (JDP:ah 01/17/18)   01/16/2018 Receptors her2   Estrogen Receptor: 100%, POSITIVE, STRONG STAINING INTENSITY Progesterone Receptor: 50%, POSITIVE, STRONG STAINING INTENSITY Proliferation Marker Ki67: 20% HER2 Negative   01/16/2018 Cancer Staging   Staging form: Breast, AJCC  8th Edition - Clinical stage from 01/16/2018: Stage IA (cT1c, cN0, cM0, G2, ER+, PR+, HER2-) - Signed by Malachy Mood, MD on 01/23/2018   01/22/2018 Initial Diagnosis   Malignant neoplasm of lower-inner quadrant of left breast in female, estrogen receptor positive ([name])   02/21/2018 Surgery    LEFT BREAST LUMPECTOMY WITH AXILLARY LYMPH NODE BIOPSY by Dr. Donell Beers  02/21/18   02/21/2018 Pathology Results   Diagnosis 02/21/18 1. Breast, lumpectomy, Left - INVASIVE DUCTAL CARCINOMA, GRADE I, 1.6 CM. - DUCTAL CARCINOMA IN SITU, INTERMEDIATE NUCLEAR GRADE. - ANTERIOR AND MEDIAL RESECTION MARGINS ARE POSITIVE FOR CARCINOMA. - NEGATIVE FOR LYMPHOVASCULAR OR PERINEURAL INVASION. - BACKGROUND BREAST TISSUE WITH FIBROCYSTIC CHANGE, INCLUDING SCLEROSING ADENOSIS. - BIOPSY SITE CHANGES. - SEE ONCOLOGY TABLE. 2. Lymph node, sentinel, biopsy, Left Axillary #1 - METASTATIC BREAST CARCINOMA TO A LYMPH NODE, 1.0 CM IN GREATEST DIMENSION, WITH EXTRANODAL EXTENSION (1/1). 3. Lymph node, sentinel, biopsy, Left Axillary #2 - LYMPH NODE, NEGATIVE FOR CARCINOMA (0/1).    02/21/2018 Miscellaneous   Mammaprint 02/21/18 Low Risk with 10-year risk of recurrnce at 10% -No potential signifcant chemotherapy benefit   03/20/2018 Pathology Results   RE-EXCISION OF BREAST LUMPECTOMY by Dr. Donell Beers  Diagnosis 03/20/18 1. Breast, excision, Left new anterior margin - FIBROCYSTIC CHANGES WITH ADENOSIS AND CALCIFICATIONS. - HEALING BIOPSY SITE. - THERE IS NO EVIDENCE OF MALIGNANCY. 2. Breast, excision, Left new medial margin - FIBROCYSTIC CHANGES WITH ADENOSIS AND CALCIFICATIONS. - HEALING BIOPSY SITE. - THERE IS NO EVIDENCE OF MALIGNANCY. Microscopic Comment 1. -2. The surgical resection margin(s) of the specimen were inked and microscopically evaluated. (JBK:kh 03-22-18)   04/09/2018 Cancer Staging   Staging form: Breast,  AJCC 8th Edition - Pathologic: Stage IA (pT1c, pN1, cM0, G1, ER+, PR+, HER2-) - Signed by Lonie Peak, MD on 04/09/2018   04/22/2018 - 06/03/2018 Radiation Therapy   Radaiton with Dr. Basilio Cairo 04/22/18-06/03/18   05/2018 -  Anti-estrogen oral therapy   Letrozole 2.5mg  started 05/2018    Survivorship   Per Santiago Glad, NP    05/04/2022 Imaging    IMPRESSION: Cervical spondylosis, as described.   Nonspecific straightening of the expected cervical lordosis.   07/24/2022 Imaging    IMPRESSION: 1. Extensive osseous metastatic disease with pathologic fracture at the base of dens and C2 right lateral mass. Extraosseous tumor at C1 and C2 likely impinging on the right C2 and C3 nerve roots. 2. Degenerative cord impingement at C4-5 to C6-7. Biforaminal impingement at C5-6 and C6-7.   08/03/2022 PET scan    IMPRESSION: 1. Large volume osseous metastasis. 2. Low right cervical and probable right axillary nodal metastasis. 3. Subtle heterogeneous activity throughout the liver with suggestion of small liver lesions (likely new compared to chest CT of 07/16/2020). Findings are overall moderately suspicious for hepatic metastasis. Pre and post contrast abdominal MRI (preferred) or CT could confirm. 4. Right-sided pleural thickening and trace pleural fluid. Right base airspace disease is favored to represent chronic atelectasis. 5. Aortic atherosclerosis (ICD10-I70.0) and emphysema (ICD10-J43.9).     08/11/2022 Genetic Testing   Negative genetic testing on the CancerNext-Expanded+RNAinsight panel.  The report date is August 11, 2022.  The CancerNext-Expanded gene panel offered by [facility] and includes sequencing and rearrangement analysis for the following 77 genes: AIP, ALK, APC*, ATM*, AXIN2, BAP1, BARD1, BLM, BMPR1A, BRCA1*, BRCA2*, BRIP1*, CDC73, CDH1*, CDK4, CDKN1B, CDKN2A, CHEK2*, CTNNA1, DICER1, FANCC, FH, FLCN, GALNT12, KIF1B, LZTR1, MAX, MEN1, MET, MLH1*, MSH2*, MSH3, MSH6*, MUTYH*, NBN, NF1*, NF2, NTHL1, PALB2*, PHOX2B, PMS2*, POT1, PRKAR1A, PTCH1, PTEN*, RAD51C*, RAD51D*,  RB1, RECQL, RET, SDHA, SDHAF2, SDHB, SDHC, SDHD, SMAD4, SMARCA4, SMARCB1, SMARCE1, STK11, SUFU, TMEM127, TP53*, TSC1, TSC2, VHL and XRCC2 (sequencing and deletion/duplication); EGFR, EGLN1, HOXB13, KIT, MITF, PDGFRA, POLD1, and POLE (sequencing only); EPCAM and GREM1 (deletion/duplication only). DNA and RNA analyses performed for * genes.    Lobular carcinoma in situ (LCIS) of right breast  01/20/2020 Mammogram   Diagnostic Mammogram 01/20/20 IMPRESSION: 1.  Stable post lumpectomy changes of the left breast.   2. Suspicious microcalcifications over the right upper outer quadrant spanning 3.6 cm.   02/02/2020 Initial Biopsy   Diagnosis 02/02/20 Breast, right, needle core biopsy, upper outer quadrant, x clip - LOBULAR CARCINOMA IN SITU WITH PLEOMORPHIC FEATURES AND CALCIFICATIONS, INVOLVING ADENOSIS. SEE NOTE Diagnosis Note Immunohistochemical stain for E-cadherin is negative in the lesional cells, consistent with a lobular phenotype. Immunostains for p63, SMM 1 and calponin do not show evidence of invasive carcinoma.    02/04/2020 Initial Diagnosis   Lobular carcinoma in situ (LCIS) of right breast   03/18/2020 Surgery   RIGHT BREAST LUMPECTOMY WITH RADIOACTIVE SEED LOCALIZATION by Dr Alvira Monday    03/18/2020 Pathology Results   FINAL MICROSCOPIC DIAGNOSIS:   A. BREAST, RIGHT, LUMPECTOMY:  - Pleomorphic lobular carcinoma in situ with calcifications and  underlying complex sclerosing lesion, adenosis and fibroadenomatoid  change.  - Margins of resection are not involved (Closest margins: < 1 mm,  anterior, posterior, inferior and medial).  - Biopsy site.    COMMENT:   P63, Calponin and SMM-1 demonstrate the presence of myoepithelium in the  select focus.    Metastatic breast cancer to bone and brain  05/04/2022  Imaging    IMPRESSION: Cervical spondylosis, as described.   Nonspecific straightening of the expected cervical lordosis.   07/24/2022 Imaging    IMPRESSION: 1. Extensive  osseous metastatic disease with pathologic fracture at the base of dens and C2 right lateral mass. Extraosseous tumor at C1 and C2 likely impinging on the right C2 and C3 nerve roots. 2. Degenerative cord impingement at C4-5 to C6-7. Biforaminal impingement at C5-6 and C6-7.   08/02/2022 Initial Diagnosis   Cancer, metastatic to bone ([name])   08/03/2022 PET scan    IMPRESSION: 1. Large volume osseous metastasis. 2. Low right cervical and probable right axillary nodal metastasis. 3. Subtle heterogeneous activity throughout the liver with suggestion of small liver lesions (likely new compared to chest CT of 07/16/2020). Findings are overall moderately suspicious for hepatic metastasis. Pre and post contrast abdominal MRI (preferred) or CT could confirm. 4. Right-sided pleural thickening and trace pleural fluid. Right base airspace disease is favored to represent chronic atelectasis. 5. Aortic atherosclerosis (ICD10-I70.0) and emphysema (ICD10-J43.9).     10/26/2022 - 04/12/2023 Chemotherapy   Patient is on Treatment Plan : BREAST Paclitaxel D1,8,15 + Trastuzumab D1 + Pertuzumab D1 q21d x 8 cycles / Trastuzumab D1 + Pertuzumab D1 q21d x 4 cycles     05/03/2023 -  Chemotherapy   Patient is on Treatment Plan : BREAST MAINTENANCE Trastuzumab IV (6) or SQ (600) D1 q21d X 11 Cycles        Discussed the use of AI scribe software for clinical note transcription with the patient, who gave verbal consent to proceed.  History of Present Illness   The patient, a 62 year old female with metastatic breast cancer, reports an overall improvement in her condition. She notes an increase in appetite and activity, with the ability to perform tasks such as bending, walking up and down stairs, and doing laundry. She also reports improved balance and no recent falls. She denies any significant pain, taking only one Tylenol a day for mild back discomfort. She mentions that her diarrhea has improved significantly.  She is currently on a chemotherapy regimen with Xeloda (capecitabine) and Tucatinib, which she tolerates well with no significant side effects. She also mentions a recent brain scan that showed positive results, with some spots potentially being scars rather than cancer.         All other systems were reviewed with the patient and are negative.  MEDICAL HISTORY:  Past Medical History:  Diagnosis Date   Anxiety    Cancer ([name]) 2022   right breast LCIS   Cancer ([name]) 2019   left breast   Depression    Dysrhythmia    SVT, s/p ablation ~ 2012 in at [facility]   Ectopic pregnancy    Family history of breast cancer    Family history of melanoma    History of radiation therapy 04/22/18-06/03/18   Left Breast, left SCV, axilla 50 Gy in 25 fractions, Left breast boost 10 Gy in 5 fractions.    Hypothyroidism    Personal history of radiation therapy    PONV (postoperative nausea and vomiting)    Thyroid disease     SURGICAL HISTORY: Past Surgical History:  Procedure Laterality Date   APPENDECTOMY     BILATERAL SALPINGECTOMY     BREAST BIOPSY Right 2014   fibroadenoma   BREAST BIOPSY Left 01/16/2018   BREAST BIOPSY Right 02/02/2020   LCIS   BREAST BIOPSY Right 08/04/2020   x2 LCIS   BREAST BIOPSY Right  08/13/2020   x2   BREAST EXCISIONAL BIOPSY Left 1990   benign   BREAST EXCISIONAL BIOPSY Right 02/02/2020   LCIS   BREAST LUMPECTOMY Left 01/22/2018   BREAST LUMPECTOMY WITH AXILLARY LYMPH NODE BIOPSY Left 02/21/2018   Procedure: LEFT BREAST LUMPECTOMY WITH AXILLARY LYMPH NODE BIOPSY;  Surgeon: Almond Lint, MD;  Location: [facility] OR;  Service: General;  Laterality: Left;   BREAST LUMPECTOMY WITH RADIOACTIVE SEED LOCALIZATION Right 03/18/2020   Procedure: RIGHT BREAST LUMPECTOMY WITH RADIOACTIVE SEED LOCALIZATION;  Surgeon: Almond Lint, MD;  Location: [facility] OR;  Service: General;  Laterality: Right;   BREAST SURGERY Left 1993   cyst removed   ENDOMETRIAL ABLATION      EYE SURGERY     GLAUCOMA SURGERY Bilateral    IR IMAGING GUIDED PORT INSERTION  10/16/2022   POSTERIOR CERVICAL FUSION/FORAMINOTOMY N/A 08/17/2022   Procedure: Cervical One-Cervical Four POSTERIOR CERVICAL FUSION,  Cervical One LAMINECTOMY REDUCTION OF Cervical Two Fracture, Biopsy of Right Cerical Two Pars  Lesion;  Surgeon: Bedelia Person, MD;  Location: [facility] OR;  Service: Neurosurgery;  Laterality: N/A;   POSTERIOR CERVICAL FUSION/FORAMINOTOMY N/A 09/06/2022   Procedure: Posterior Cervical Fusion, Foraminotomy , Cervical Five-Six, Cervical Six-Seven; Extension of  fusion Cervical Four- Thoracic One;  Surgeon: Bedelia Person, MD;  Location: [facility] OR;  Service: Neurosurgery;  Laterality: N/A;   RADIOACTIVE SEED GUIDED EXCISIONAL BREAST BIOPSY Right 04/28/2021   Procedure: RADIOACTIVE SEED GUIDED EXCISIONAL RIGHT BREAST BIOPSY X2;  Surgeon: Almond Lint, MD;  Location: [facility];  Service: General;  Laterality: Right;   RE-EXCISION OF BREAST LUMPECTOMY Left 03/20/2018   Procedure: RE-EXCISION OF BREAST LUMPECTOMY;  Surgeon: Almond Lint, MD;  Location: [facility];  Service: General;  Laterality: Left;    I have reviewed the social history and family history with the patient and they are unchanged from previous note.  ALLERGIES:  is allergic to morphine and codeine.  MEDICATIONS:  Current Outpatient Medications  Medication Sig Dispense Refill   acetaminophen (TYLENOL) 500 MG tablet Take 2 tablets (1,000 mg total) by mouth every 8 (eight) hours. 30 tablet 0   capecitabine (XELODA) 500 MG tablet TAKE 2 TABLETS TWICE DAILY FOR 7 DAYS ON THEN 7 DAYS OFF. TAKE AFTER MEALS 56 tablet 2   citalopram (CELEXA) 40 MG tablet TAKE 1 TABLET BY MOUTH EVERY DAY 90 tablet 2   diphenoxylate-atropine (LOMOTIL) 2.5-0.025 MG tablet Take 1 tablet by mouth 4 (four) times daily as needed for diarrhea or loose stools. 90 tablet 0   levothyroxine (SYNTHROID) 75 MCG tablet Take 1 tablet  (75 mcg total) by mouth daily before breakfast. 30 tablet 0   lidocaine (XYLOCAINE) 2 % solution Patient: Mix 1part 2% viscous lidocaine, 1part H20. Swallow 10mL of diluted mixture, before meals and at bedtime, up to QID 100 mL 3   loperamide (IMODIUM A-D) 2 MG tablet Take 1 tablet (2 mg total) by mouth 4 (four) times daily as needed for diarrhea or loose stools. 90 tablet 0   LORazepam (ATIVAN) 0.5 MG tablet Take 1 tablet (0.5 mg total) by mouth every 6 (six) hours as needed for anxiety (agitation). 30 tablet 0   Mouthwashes (MOUTH RINSE) LIQD solution 15 mLs by Mouth Rinse route as needed (for oral care).  0   nicotine (NICODERM CQ - DOSED IN MG/24 HR) 7 mg/24hr patch Place 1 patch (7 mg total) onto the skin daily. 28 patch 0   ondansetron (ZOFRAN) 8 MG tablet Take  1 tablet (8 mg total) by mouth every 8 (eight) hours as needed for nausea or vomiting. 20 tablet 1   OVER THE COUNTER MEDICATION Take 1 tablet by mouth daily. Protandim Supplement     oxyCODONE (OXY IR/ROXICODONE) 5 MG immediate release tablet Take 0.5-1 tablets (2.5-5 mg total) by mouth every 4 (four) hours as needed for moderate pain (pain score 4-6) or severe pain (pain score 7-10) (dyspnea). 30 tablet 0   pregabalin (LYRICA) 25 MG capsule Take 1 capsule (25 mg total) by mouth 2 (two) times daily. 60 capsule 2   senna-docusate (SENOKOT-S) 8.6-50 MG tablet Take 1 tablet by mouth 2 (two) times daily. 60 tablet 0   tucatinib (TUKYSA) 150 MG tablet Take 2 tablets (300 mg total) by mouth 2 (two) times daily. Take as instructed per MD. 120 tablet 2   No current facility-administered medications for this visit.    PHYSICAL EXAMINATION: ECOG PERFORMANCE STATUS: 2 - Symptomatic, <50% confined to bed  Vitals:   10/18/23 1137  BP: 129/62  Pulse: 89  Resp: 16  Temp: 97.9 F (36.6 C)  SpO2: 100%   Wt Readings from Last 3 Encounters:  10/18/23 74 lb 11.2 oz (33.9 kg)  09/27/23 74 lb 8 oz (33.8 kg)  09/06/23 73 lb 14.4 oz (33.5  kg)     GENERAL:alert, no distress and comfortable SKIN: skin color, texture, turgor are normal, no rashes or significant lesions EYES: normal, Conjunctiva are pink and non-injected, sclera clear NECK: supple, thyroid normal size, non-tender, without nodularity LYMPH:  no palpable lymphadenopathy in the cervical, axillary  LUNGS: clear to auscultation and percussion with normal breathing effort HEART: regular rate & rhythm and no murmurs and no lower extremity edema ABDOMEN:abdomen soft, non-tender and normal bowel sounds Musculoskeletal:no cyanosis of digits and no clubbing  NEURO: alert & oriented x 3 with fluent speech, no focal motor/sensory deficits   LABORATORY DATA:  I have reviewed the data as listed    Latest Ref Rng & Units 10/18/2023   11:21 AM 09/27/2023    8:05 AM 09/06/2023    7:58 AM  CBC  WBC 4.0 - 10.5 K/uL 7.3  5.8  6.3   Hemoglobin 12.0 - 15.0 g/dL 09.8  11.9  14.7   Hematocrit 36.0 - 46.0 % 44.5  43.8  40.5   Platelets 150 - 400 K/uL 217  217  232         Latest Ref Rng & Units 10/18/2023   11:21 AM 09/27/2023    8:05 AM 09/06/2023    7:58 AM  CMP  Glucose 70 - 99 mg/dL 93  829  562   BUN 8 - 23 mg/dL 14  15  11    Creatinine 0.44 - 1.00 mg/dL 1.30  8.65  7.84   Sodium 135 - 145 mmol/L 137  138  137   Potassium 3.5 - 5.1 mmol/L 4.2  4.2  3.9   Chloride 98 - 111 mmol/L 102  103  101   CO2 22 - 32 mmol/L 33  32  31   Calcium 8.9 - 10.3 mg/dL 9.5  9.5  9.3   Total Protein 6.5 - 8.1 g/dL 6.6  6.6  6.7   Total Bilirubin 0.0 - 1.2 mg/dL 0.7  0.4  0.8   Alkaline Phos 38 - 126 U/L 99  108  112   AST 15 - 41 U/L 27  18  20    ALT 0 - 44 U/L 15  12  13  RADIOGRAPHIC STUDIES: I have personally reviewed the radiological images as listed and agreed with the findings in the report. No results found.    Orders Placed This Encounter  Procedures   NM PET Image Restag (PS) Skull Base To Thigh    Standing Status:   Future    Expected Date:   11/22/2023     Expiration Date:   10/17/2024    If indicated for the ordered procedure, I authorize the administration of a radiopharmaceutical per Radiology protocol:   Yes    Preferred imaging location?:   [facility]   All questions were answered. The patient knows to call the clinic with any problems, questions or concerns. No barriers to learning was detected. The total time spent in the appointment was 25 minutes.     Malachy Mood, MD 10/18/2023

## 2023-10-26 ENCOUNTER — Other Ambulatory Visit: Payer: Self-pay

## 2023-10-26 DIAGNOSIS — C7951 Secondary malignant neoplasm of bone: Secondary | ICD-10-CM

## 2023-10-26 MED ORDER — TUCATINIB 150 MG PO TABS: 300.0000 mg | ORAL_TABLET | Freq: Two times a day (BID) | ORAL | 2 refills | Status: DC

## 2023-10-29 ENCOUNTER — Other Ambulatory Visit: Payer: Self-pay | Admitting: Nurse Practitioner

## 2023-11-02 ENCOUNTER — Other Ambulatory Visit: Payer: Self-pay | Admitting: Family Medicine

## 2023-11-04 ENCOUNTER — Other Ambulatory Visit: Payer: Self-pay | Admitting: Nurse Practitioner

## 2023-11-04 DIAGNOSIS — R197 Diarrhea, unspecified: Secondary | ICD-10-CM

## 2023-11-04 DIAGNOSIS — M792 Neuralgia and neuritis, unspecified: Secondary | ICD-10-CM

## 2023-11-04 DIAGNOSIS — Z515 Encounter for palliative care: Secondary | ICD-10-CM

## 2023-11-04 DIAGNOSIS — C7951 Secondary malignant neoplasm of bone: Secondary | ICD-10-CM

## 2023-11-04 MED ORDER — PREGABALIN 25 MG PO CAPS
25.0000 mg | ORAL_CAPSULE | Freq: Two times a day (BID) | ORAL | 2 refills | Status: DC
Start: 2023-11-04 — End: 2024-01-10

## 2023-11-04 MED ORDER — DIPHENOXYLATE-ATROPINE 2.5-0.025 MG PO TABS: 1.0000 | ORAL_TABLET | Freq: Four times a day (QID) | ORAL | 0 refills | Status: DC | PRN

## 2023-11-05 ENCOUNTER — Encounter: Payer: Self-pay | Admitting: Family Medicine

## 2023-11-05 ENCOUNTER — Other Ambulatory Visit: Payer: Self-pay | Admitting: Physical Medicine and Rehabilitation

## 2023-11-06 ENCOUNTER — Other Ambulatory Visit: Payer: Self-pay

## 2023-11-08 ENCOUNTER — Encounter: Payer: Self-pay | Admitting: Family Medicine

## 2023-11-08 ENCOUNTER — Other Ambulatory Visit: Payer: BC Managed Care – PPO

## 2023-11-08 ENCOUNTER — Inpatient Hospital Stay: Payer: BC Managed Care – PPO | Attending: Genetic Counselor

## 2023-11-08 ENCOUNTER — Ambulatory Visit: Payer: BC Managed Care – PPO | Admitting: Hematology

## 2023-11-08 ENCOUNTER — Ambulatory Visit (INDEPENDENT_AMBULATORY_CARE_PROVIDER_SITE_OTHER): Payer: Self-pay | Admitting: Family Medicine

## 2023-11-08 ENCOUNTER — Inpatient Hospital Stay: Payer: Self-pay

## 2023-11-08 ENCOUNTER — Encounter: Payer: Self-pay | Admitting: Hematology

## 2023-11-08 VITALS — BP 94/69 | HR 83 | Temp 98.0°F | Ht 62.0 in | Wt 73.4 lb

## 2023-11-08 VITALS — BP 131/67 | HR 76 | Temp 97.8°F | Resp 18 | Wt 73.5 lb

## 2023-11-08 DIAGNOSIS — Z170 Estrogen receptor positive status [ER+]: Secondary | ICD-10-CM

## 2023-11-08 DIAGNOSIS — C7951 Secondary malignant neoplasm of bone: Secondary | ICD-10-CM | POA: Diagnosis not present

## 2023-11-08 DIAGNOSIS — Z171 Estrogen receptor negative status [ER-]: Secondary | ICD-10-CM | POA: Diagnosis not present

## 2023-11-08 DIAGNOSIS — Z1722 Progesterone receptor negative status: Secondary | ICD-10-CM | POA: Insufficient documentation

## 2023-11-08 DIAGNOSIS — Z1731 Human epidermal growth factor receptor 2 positive status: Secondary | ICD-10-CM | POA: Diagnosis not present

## 2023-11-08 DIAGNOSIS — C50312 Malignant neoplasm of lower-inner quadrant of left female breast: Secondary | ICD-10-CM

## 2023-11-08 DIAGNOSIS — E039 Hypothyroidism, unspecified: Secondary | ICD-10-CM | POA: Diagnosis not present

## 2023-11-08 DIAGNOSIS — C7931 Secondary malignant neoplasm of brain: Secondary | ICD-10-CM | POA: Diagnosis not present

## 2023-11-08 DIAGNOSIS — Z5112 Encounter for antineoplastic immunotherapy: Secondary | ICD-10-CM | POA: Diagnosis not present

## 2023-11-08 DIAGNOSIS — G893 Neoplasm related pain (acute) (chronic): Secondary | ICD-10-CM | POA: Insufficient documentation

## 2023-11-08 DIAGNOSIS — Z79899 Other long term (current) drug therapy: Secondary | ICD-10-CM | POA: Diagnosis not present

## 2023-11-08 DIAGNOSIS — Z95828 Presence of other vascular implants and grafts: Secondary | ICD-10-CM

## 2023-11-08 LAB — TSH: TSH: 1.883 u[IU]/mL (ref 0.350–4.500)

## 2023-11-08 LAB — CMP (CANCER CENTER ONLY)
ALT: 11 U/L (ref 0–44)
AST: 15 U/L (ref 15–41)
Albumin: 4.5 g/dL (ref 3.5–5.0)
Alkaline Phosphatase: 82 U/L (ref 38–126)
Anion gap: 4 — ABNORMAL LOW (ref 5–15)
BUN: 9 mg/dL (ref 8–23)
CO2: 31 mmol/L (ref 22–32)
Calcium: 9 mg/dL (ref 8.9–10.3)
Chloride: 102 mmol/L (ref 98–111)
Creatinine: 0.48 mg/dL (ref 0.44–1.00)
GFR, Estimated: >60 mL/min (ref >60)
Glucose, Bld: 116 mg/dL — ABNORMAL HIGH (ref 70–99)
Potassium: 3.6 mmol/L (ref 3.5–5.1)
Sodium: 137 mmol/L (ref 135–145)
Total Bilirubin: 0.8 mg/dL (ref 0.0–1.2)
Total Protein: 6.8 g/dL (ref 6.5–8.1)

## 2023-11-08 LAB — CBC WITH DIFFERENTIAL (CANCER CENTER ONLY)
Abs Immature Granulocytes: 0.02 10*3/uL (ref 0.00–0.07)
Basophils Absolute: 0 10*3/uL (ref 0.0–0.1)
Basophils Relative: 1 %
Eosinophils Absolute: 0 10*3/uL (ref 0.0–0.5)
Eosinophils Relative: 0 %
HCT: 43.7 % (ref 36.0–46.0)
Hemoglobin: 15.2 g/dL — ABNORMAL HIGH (ref 12.0–15.0)
Immature Granulocytes: 0 %
Lymphocytes Relative: 14 %
Lymphs Abs: 0.8 10*3/uL (ref 0.7–4.0)
MCH: 34.9 pg — ABNORMAL HIGH (ref 26.0–34.0)
MCHC: 34.8 g/dL (ref 30.0–36.0)
MCV: 100.5 fL — ABNORMAL HIGH (ref 80.0–100.0)
Monocytes Absolute: 0.4 10*3/uL (ref 0.1–1.0)
Monocytes Relative: 7 %
Neutro Abs: 4.7 10*3/uL (ref 1.7–7.7)
Neutrophils Relative %: 78 %
Platelet Count: 202 10*3/uL (ref 150–400)
RBC: 4.35 MIL/uL (ref 3.87–5.11)
RDW: 16.6 % — ABNORMAL HIGH (ref 11.5–15.5)
WBC Count: 6 10*3/uL (ref 4.0–10.5)
nRBC: 0 % (ref 0.0–0.2)

## 2023-11-08 MED ORDER — TRASTUZUMAB-ANNS CHEMO 150 MG IV SOLR
6.0000 mg/kg | Freq: Once | INTRAVENOUS | Status: AC
  Administered 2023-11-08: 210 mg via INTRAVENOUS
  Filled 2023-11-08: qty 10

## 2023-11-08 MED ORDER — DIPHENHYDRAMINE HCL 25 MG PO CAPS
50.0000 mg | ORAL_CAPSULE | Freq: Once | ORAL | Status: AC
  Administered 2023-11-08: 50 mg via ORAL
  Filled 2023-11-08: qty 2

## 2023-11-08 MED ORDER — SODIUM CHLORIDE 0.9% FLUSH
10.0000 mL | Freq: Once | INTRAVENOUS | Status: AC
  Administered 2023-11-08: 10 mL

## 2023-11-08 MED ORDER — LEVOTHYROXINE SODIUM 75 MCG PO TABS: 75.0000 ug | ORAL_TABLET | Freq: Every day | ORAL | 3 refills | Status: AC

## 2023-11-08 MED ORDER — HEPARIN SOD (PORK) LOCK FLUSH 100 UNIT/ML IV SOLN
500.0000 [IU] | Freq: Once | INTRAVENOUS | Status: AC | PRN
  Administered 2023-11-08: 500 [IU]

## 2023-11-08 MED ORDER — ACETAMINOPHEN 325 MG PO TABS
650.0000 mg | ORAL_TABLET | Freq: Once | ORAL | Status: AC
  Administered 2023-11-08: 650 mg via ORAL
  Filled 2023-11-08: qty 2

## 2023-11-08 MED ORDER — SODIUM CHLORIDE 0.9% FLUSH
10.0000 mL | INTRAVENOUS | Status: DC | PRN
  Administered 2023-11-08: 10 mL

## 2023-11-08 MED ORDER — SODIUM CHLORIDE 0.9 % IV SOLN: Freq: Once | INTRAVENOUS | Status: AC

## 2023-11-08 NOTE — Assessment & Plan Note (Signed)
 Last TSH was over a year ago.  She will be having labs done this morning at oncology.  Will try to add on TSH.  Continue levothyroxine 75 mcg daily.  Will refill today.

## 2023-11-08 NOTE — Patient Instructions (Signed)

## 2023-11-08 NOTE — Progress Notes (Signed)
   Martha [name] is a 63 y.o. female who presents today for an office visit.  Assessment/Plan:  Chronic Problems Addressed Today: Hypothyroidism Last TSH was over a year ago.  She will be having labs done this morning at oncology.  Will try to add on TSH.  Continue levothyroxine 75 mcg daily.  Will refill today.  Metastatic Breast Cancer Had lengthy discussion with patient and her husband today regarding her metastatic breast cancer and course over the last 18 months or so.  Overall she has good support in place.  She is tolerating her treatment well per oncology.  Her pain is manageable though did discuss would be reasonable for her to increase dose of Lyrica if needed.  She can discuss this further with palliative care.  Continue management per oncology.    Subjective:  HPI:  See A/P for status of chronic conditions.  Patient is here today for follow-up.  She needs a refill levothyroxine.  I last saw her over a year ago.  Since her last visit she was diagnosed with metastatic breast cancer and has been following with oncology for this.  She also recently established with palliative care.  Overall she feels like she is doing as best as she can.  She is looking forward to the warmer weather.  Her pain is currently managed via palliative care with Lyrica 25 mg twice daily.  She is also on chemotherapy per oncology.  She did have some complication with the diarrhea but this has improved.       Objective:  Physical Exam: BP 94/69   Pulse 83   Temp 98 F (36.7 C) (Temporal)   Ht 5\' 2"  (1.575 m)   Wt 73 lb 6.4 oz (33.3 kg)   SpO2 98%   BMI 13.43 kg/m   Gen: No acute distress, resting comfortably CV: Regular rate and rhythm with no murmurs appreciated Pulm: Normal work of breathing, clear to auscultation bilaterally with no crackles, wheezes, or rhonchi Neuro: Grossly normal, moves all extremities Psych: Normal affect and thought content      Martha [name] M. Jimmey Ralph, MD 11/08/2023  10:16 AM

## 2023-11-09 ENCOUNTER — Encounter: Payer: Self-pay | Admitting: Family Medicine

## 2023-11-09 NOTE — Progress Notes (Signed)
 Her thyroid level is at goal.  She can continue her current dose of thyroid replacement.  We can recheck in a year.

## 2023-11-22 ENCOUNTER — Ambulatory Visit (HOSPITAL_COMMUNITY)
Admission: RE | Admit: 2023-11-22 | Discharge: 2023-11-22 | Disposition: A | Discharge status: Home / Self Care | Payer: Self-pay | Source: Ambulatory Visit | Attending: Hematology | Admitting: Hematology

## 2023-11-22 DIAGNOSIS — C7951 Secondary malignant neoplasm of bone: Secondary | ICD-10-CM | POA: Insufficient documentation

## 2023-11-22 LAB — GLUCOSE, CAPILLARY: Glucose-Capillary: 91 mg/dL (ref 70–99)

## 2023-11-22 MED ORDER — FLUDEOXYGLUCOSE F - 18 (FDG) INJECTION
5.0000 | Freq: Once | INTRAVENOUS | Status: AC
  Administered 2023-11-22: 4.94 via INTRAVENOUS

## 2023-11-29 ENCOUNTER — Other Ambulatory Visit: Payer: Self-pay

## 2023-11-29 ENCOUNTER — Inpatient Hospital Stay: Payer: BC Managed Care – PPO

## 2023-11-29 ENCOUNTER — Encounter: Payer: Self-pay | Admitting: Hematology

## 2023-11-29 ENCOUNTER — Other Ambulatory Visit: Payer: Self-pay | Admitting: *Deleted

## 2023-11-29 ENCOUNTER — Inpatient Hospital Stay (HOSPITAL_BASED_OUTPATIENT_CLINIC_OR_DEPARTMENT_OTHER): Payer: BC Managed Care – PPO | Admitting: Hematology

## 2023-11-29 ENCOUNTER — Encounter: Payer: Self-pay | Admitting: Nurse Practitioner

## 2023-11-29 ENCOUNTER — Inpatient Hospital Stay (HOSPITAL_BASED_OUTPATIENT_CLINIC_OR_DEPARTMENT_OTHER): Payer: Self-pay | Admitting: Nurse Practitioner

## 2023-11-29 ENCOUNTER — Inpatient Hospital Stay: Payer: BC Managed Care – PPO | Attending: Genetic Counselor

## 2023-11-29 VITALS — BP 116/66 | HR 80 | Temp 98.8°F | Resp 16 | Ht 62.0 in | Wt 75.0 lb

## 2023-11-29 DIAGNOSIS — C50312 Malignant neoplasm of lower-inner quadrant of left female breast: Secondary | ICD-10-CM | POA: Diagnosis not present

## 2023-11-29 DIAGNOSIS — Z170 Estrogen receptor positive status [ER+]: Secondary | ICD-10-CM

## 2023-11-29 DIAGNOSIS — M792 Neuralgia and neuritis, unspecified: Secondary | ICD-10-CM | POA: Diagnosis not present

## 2023-11-29 DIAGNOSIS — Z5112 Encounter for antineoplastic immunotherapy: Secondary | ICD-10-CM | POA: Diagnosis not present

## 2023-11-29 DIAGNOSIS — C7951 Secondary malignant neoplasm of bone: Secondary | ICD-10-CM | POA: Insufficient documentation

## 2023-11-29 DIAGNOSIS — R530 Neoplastic (malignant) related fatigue: Secondary | ICD-10-CM

## 2023-11-29 DIAGNOSIS — C7931 Secondary malignant neoplasm of brain: Secondary | ICD-10-CM | POA: Insufficient documentation

## 2023-11-29 DIAGNOSIS — G893 Neoplasm related pain (acute) (chronic): Secondary | ICD-10-CM | POA: Diagnosis not present

## 2023-11-29 DIAGNOSIS — Z515 Encounter for palliative care: Secondary | ICD-10-CM | POA: Diagnosis not present

## 2023-11-29 DIAGNOSIS — Z95828 Presence of other vascular implants and grafts: Secondary | ICD-10-CM

## 2023-11-29 DIAGNOSIS — C799 Secondary malignant neoplasm of unspecified site: Secondary | ICD-10-CM | POA: Diagnosis not present

## 2023-11-29 DIAGNOSIS — Z171 Estrogen receptor negative status [ER-]: Secondary | ICD-10-CM | POA: Insufficient documentation

## 2023-11-29 DIAGNOSIS — Z79899 Other long term (current) drug therapy: Secondary | ICD-10-CM | POA: Insufficient documentation

## 2023-11-29 LAB — CBC WITH DIFFERENTIAL (CANCER CENTER ONLY)
Abs Immature Granulocytes: 0.02 10*3/uL (ref 0.00–0.07)
Basophils Absolute: 0.1 10*3/uL (ref 0.0–0.1)
Basophils Relative: 1 %
Eosinophils Absolute: 0 10*3/uL (ref 0.0–0.5)
Eosinophils Relative: 1 %
HCT: 41.2 % (ref 36.0–46.0)
Hemoglobin: 14.4 g/dL (ref 12.0–15.0)
Immature Granulocytes: 0 %
Lymphocytes Relative: 16 %
Lymphs Abs: 1.1 10*3/uL (ref 0.7–4.0)
MCH: 35 pg — ABNORMAL HIGH (ref 26.0–34.0)
MCHC: 35 g/dL (ref 30.0–36.0)
MCV: 100 fL (ref 80.0–100.0)
Monocytes Absolute: 0.5 10*3/uL (ref 0.1–1.0)
Monocytes Relative: 8 %
Neutro Abs: 5.1 10*3/uL (ref 1.7–7.7)
Neutrophils Relative %: 74 %
Platelet Count: 180 10*3/uL (ref 150–400)
RBC: 4.12 MIL/uL (ref 3.87–5.11)
RDW: 15.8 % — ABNORMAL HIGH (ref 11.5–15.5)
WBC Count: 6.8 10*3/uL (ref 4.0–10.5)
nRBC: 0 % (ref 0.0–0.2)

## 2023-11-29 LAB — CMP (CANCER CENTER ONLY)
ALT: 10 U/L (ref 0–44)
AST: 16 U/L (ref 15–41)
Albumin: 4.4 g/dL (ref 3.5–5.0)
Alkaline Phosphatase: 81 U/L (ref 38–126)
Anion gap: 4 — ABNORMAL LOW (ref 5–15)
BUN: 9 mg/dL (ref 8–23)
CO2: 32 mmol/L (ref 22–32)
Calcium: 9.1 mg/dL (ref 8.9–10.3)
Chloride: 102 mmol/L (ref 98–111)
Creatinine: 0.48 mg/dL (ref 0.44–1.00)
GFR, Estimated: >60 mL/min (ref >60)
Glucose, Bld: 92 mg/dL (ref 70–99)
Potassium: 4.1 mmol/L (ref 3.5–5.1)
Sodium: 138 mmol/L (ref 135–145)
Total Bilirubin: 0.8 mg/dL (ref 0.0–1.2)
Total Protein: 6.6 g/dL (ref 6.5–8.1)

## 2023-11-29 MED ORDER — HEPARIN SOD (PORK) LOCK FLUSH 100 UNIT/ML IV SOLN
500.0000 [IU] | Freq: Once | INTRAVENOUS | Status: AC | PRN
  Administered 2023-11-29: 500 [IU]

## 2023-11-29 MED ORDER — HEPARIN SOD (PORK) LOCK FLUSH 100 UNIT/ML IV SOLN: 250.0000 [IU] | Freq: Once | INTRAVENOUS | Status: DC | PRN

## 2023-11-29 MED ORDER — SODIUM CHLORIDE 0.9% FLUSH
10.0000 mL | Freq: Once | INTRAVENOUS | Status: AC
Start: 2023-11-29 — End: 2023-11-29
  Administered 2023-11-29: 10 mL

## 2023-11-29 MED ORDER — ACETAMINOPHEN 325 MG PO TABS
650.0000 mg | ORAL_TABLET | Freq: Once | ORAL | Status: AC
  Administered 2023-11-29: 650 mg via ORAL
  Filled 2023-11-29: qty 2

## 2023-11-29 MED ORDER — TRASTUZUMAB-ANNS CHEMO 150 MG IV SOLR
6.0000 mg/kg | Freq: Once | INTRAVENOUS | Status: AC
  Administered 2023-11-29: 210 mg via INTRAVENOUS
  Filled 2023-11-29: qty 10

## 2023-11-29 MED ORDER — SODIUM CHLORIDE 0.9% FLUSH
10.0000 mL | INTRAVENOUS | Status: DC | PRN
  Administered 2023-11-29: 10 mL

## 2023-11-29 MED ORDER — SODIUM CHLORIDE 0.9% FLUSH
3.0000 mL | INTRAVENOUS | Status: DC | PRN
Start: 2023-11-29 — End: 2023-11-29

## 2023-11-29 MED ORDER — DIPHENHYDRAMINE HCL 25 MG PO CAPS
50.0000 mg | ORAL_CAPSULE | Freq: Once | ORAL | Status: AC
  Administered 2023-11-29: 50 mg via ORAL
  Filled 2023-11-29: qty 2

## 2023-11-29 MED ORDER — ALTEPLASE 2 MG IJ SOLR: 2.0000 mg | Freq: Once | INTRAMUSCULAR | Status: DC | PRN

## 2023-11-29 MED ORDER — SODIUM CHLORIDE 0.9 % IV SOLN
Freq: Once | INTRAVENOUS | Status: AC
Start: 2023-11-29 — End: 2023-11-29

## 2023-11-29 NOTE — Assessment & Plan Note (Signed)
-  She is on low-dose oxycodone, she has developed a significant pain in the right rib cage, probably related to her bone metastasis  -f/u with palliative care clinic NP Nikki  

## 2023-11-29 NOTE — Assessment & Plan Note (Signed)
 She was diagnosed in 12/2017. ER+/PR+/HER2- -She is s/p left breast lumpectomy and adjuvant radiation.  -She started anti-estrogen therapy with letrozole on 05/2018. Tolerating well with no issues -lost f/u after visit in 04/2020 until her recurrence in 07/2022

## 2023-11-29 NOTE — Progress Notes (Signed)
 [facility]   Telephone:([phone] Fax:([phone]) [phone]   Clinic Follow up Note   Patient Care Team: Ardith Dark, MD as PCP - General (Family Medicine) Almond Lint, MD as Consulting Physician (General Surgery) Malachy Mood, MD as Consulting Physician (Hematology) Lonie Peak, MD as Attending Physician (Radiation Oncology) Pollyann Samples, NP as Nurse Practitioner (Nurse Practitioner)  Date of Service:  11/29/2023  CHIEF COMPLAINT: f/u of metastatic breast cancer  CURRENT THERAPY:  Xeloda, Tucatinib and trastuzumab  Oncology History   Metastatic breast cancer to bone and brain -Diagnosed in 06/2022, ER-/PR-/HER2+ --PET scan from 08/03/2022 showed diffuse bone mets  -she underwent cervical laminectomy and fusion by Dr. Maisie Fus on 08/17/2022, biopsy confirmed metastatic breast cancer, ER/PR negative, HER2 positive. -due to cervical cord compression with myelopathy, she underwent a second cervical spine surgery on September 06, 2022, and completed inpatient rehabitation. -She has completed brain, cervical and lumbar spine radiation in Feb 2024 -She was recently hospitalized for altered mental status and confusion, probably secondary to medication -She started first line systemic chemotherapy with weekly Taxol and trastuzumab/Perjeta on 10/25/22. She has tolerated well so far and her pain has improved overall -PET scan from Jan 01, 2023 showed complete metabolic response, no hypermetabolic uptake in diffuse bone lesions, no other new lesions.  She is clinically also doing much better.  Given her excellent response to chemo, we will change her Taxol to 2 weeks on and 1 week off -She Unfortunately developed more more brain metastasis on recent brain MRI. -I personally reviewed her restaging PET scan from April 13, 2023, which showed stable disease, no new lesions. -Due to her new brain metastasis, I changed her therapy to Tucatinib, capecitabine and trastuzumab, for better  brain penetration. She started in late August 2024, tolerating well so far.  Her case was reviewed in CNS tumor board, Dr. Basilio Cairo plan to hold on whole brain radiation since she is starting new Her2 antibody and monitor her brain mets closely.  -restaging MRI brian 08/30/2023 showed partial response, PET in 06/2023 showed no hypermetabolic lesions  -she is also on Zometa every 3 months    Cancer related pain She is on low-dose oxycodone, she has developed a significant pain in the right rib cage, probably related to her bone metastasis  -f/u with palliative care clinic NP Nikki       Malignant neoplasm of lower-inner quadrant of left breast in female, estrogen receptor positive ([name]) She was diagnosed in 12/2017. ER+/PR+/HER2- -She is s/p left breast lumpectomy and adjuvant radiation.  -She started anti-estrogen therapy with letrozole on 05/2018. Tolerating well with no issues -lost f/u after visit in 04/2020 until her recurrence in 07/2022     Assessment and Plan    Metastatic breast cancer She has metastatic breast cancer with well-managed disease as evidenced by the recent PET scan. She is clinically doing well with minimal pain and improved functional status. She is tolerating her current oral medications, Decadron and Xeloda, without significant nausea, and does not require preemptive anti-nausea medication. - Continue current therapy with Decadron and Xeloda.  Bone metastasis She has bone metastasis associated with her breast cancer. She is due for her next dose of Zometa at the end of April. - Administer Zometa at the end of April.      Plan -I personally reviewed her restaging PET scan images, stable disease to me, with minimal residual uptake in her bone lesions, we will call her when the formal radiology report is  back -Continue current therapy -Will order echocardiogram before next infusion in 3 weeks -Follow-up in 3 weeks   SUMMARY OF ONCOLOGIC HISTORY: Oncology History  Overview Note  Cancer Staging Malignant neoplasm of lower-inner quadrant of left breast in female, estrogen receptor positive ([name]) Staging form: Breast, AJCC 8th Edition - Clinical stage from 01/16/2018: Stage IA (cT1c, cN0, cM0, G2, ER+, PR+, HER2-) - Signed by Malachy Mood, MD on 01/23/2018 - Pathologic: Stage IA (pT1c, pN1, cM0, G1, ER+, PR+, HER2-) - Signed by Lonie Peak, MD on 04/09/2018     Malignant neoplasm of lower-inner quadrant of left breast in female, estrogen receptor positive ([name])  01/15/2018 Mammogram   Diagnositc Mammogram 01/15/18  IMPRESSION: 1. Suspicious 1.2 x 1.4 x 1.3 cm mixed echogenicity mass left breast 7 o'clock position retroareolar location at the site of palpable concern.. 2. Indeterminate Within the left breast 7:30 o'clock retroareolar location, adjacent to the palpable mass, is a 0.5 x 0.4 x 0.5 cm oval circumscribed hypoechoic mass. 3. Indeterminate calcifications within the lateral left breast. Location of these calcifications is not definitely confirmed on the true lateral view.    01/16/2018 Initial Biopsy   Diagnosis 01/16/18 1. Breast, left, needle core biopsy, 7:30 o'clock (ribbon clip) - FIBROCYSTIC CHANGES WITH SCLEROSING ADENOSIS AND CALCIFICATIONS. - FIBROADENOMATOID CHANGE. - NO MALIGNANCY IDENTIFIED. 2. Breast, left, needle core biopsy, 7 o'clock position (coil clip) - INVASIVE MAMMARY CARCINOMA, MSBR GRADE I/II. - SEE MICROSCOPIC DESCRIPTION Microscopic Comment  ADDENDUM: Immunohistochemistry for E-Cadherin is strongly positive in the tumor consistent with ductal carcinoma. (JDP:ah 01/17/18)   01/16/2018 Receptors her2   Estrogen Receptor: 100%, POSITIVE, STRONG STAINING INTENSITY Progesterone Receptor: 50%, POSITIVE, STRONG STAINING INTENSITY Proliferation Marker Ki67: 20% HER2 Negative   01/16/2018 Cancer Staging   Staging form: Breast, AJCC 8th Edition - Clinical stage from 01/16/2018: Stage IA (cT1c, cN0, cM0, G2, ER+, PR+,  HER2-) - Signed by Malachy Mood, MD on 01/23/2018   01/22/2018 Initial Diagnosis   Malignant neoplasm of lower-inner quadrant of left breast in female, estrogen receptor positive ([name])   02/21/2018 Surgery    LEFT BREAST LUMPECTOMY WITH AXILLARY LYMPH NODE BIOPSY by Dr. Donell Beers  02/21/18   02/21/2018 Pathology Results   Diagnosis 02/21/18 1. Breast, lumpectomy, Left - INVASIVE DUCTAL CARCINOMA, GRADE I, 1.6 CM. - DUCTAL CARCINOMA IN SITU, INTERMEDIATE NUCLEAR GRADE. - ANTERIOR AND MEDIAL RESECTION MARGINS ARE POSITIVE FOR CARCINOMA. - NEGATIVE FOR LYMPHOVASCULAR OR PERINEURAL INVASION. - BACKGROUND BREAST TISSUE WITH FIBROCYSTIC CHANGE, INCLUDING SCLEROSING ADENOSIS. - BIOPSY SITE CHANGES. - SEE ONCOLOGY TABLE. 2. Lymph node, sentinel, biopsy, Left Axillary #1 - METASTATIC BREAST CARCINOMA TO A LYMPH NODE, 1.0 CM IN GREATEST DIMENSION, WITH EXTRANODAL EXTENSION (1/1). 3. Lymph node, sentinel, biopsy, Left Axillary #2 - LYMPH NODE, NEGATIVE FOR CARCINOMA (0/1).    02/21/2018 Miscellaneous   Mammaprint 02/21/18 Low Risk with 10-year risk of recurrnce at 10% -No potential signifcant chemotherapy benefit   03/20/2018 Pathology Results   RE-EXCISION OF BREAST LUMPECTOMY by Dr. Donell Beers  Diagnosis 03/20/18 1. Breast, excision, Left new anterior margin - FIBROCYSTIC CHANGES WITH ADENOSIS AND CALCIFICATIONS. - HEALING BIOPSY SITE. - THERE IS NO EVIDENCE OF MALIGNANCY. 2. Breast, excision, Left new medial margin - FIBROCYSTIC CHANGES WITH ADENOSIS AND CALCIFICATIONS. - HEALING BIOPSY SITE. - THERE IS NO EVIDENCE OF MALIGNANCY. Microscopic Comment 1. -2. The surgical resection margin(s) of the specimen were inked and microscopically evaluated. (JBK:kh 03-22-18)   04/09/2018 Cancer Staging   Staging form: Breast, AJCC 8th Edition - Pathologic: Stage  IA (pT1c, pN1, cM0, G1, ER+, PR+, HER2-) - Signed by Lonie Peak, MD on 04/09/2018   04/22/2018 - 06/03/2018 Radiation Therapy   Radaiton with Dr.  Basilio Cairo 04/22/18-06/03/18   05/2018 -  Anti-estrogen oral therapy   Letrozole 2.5mg  started 05/2018    Survivorship   Per Santiago Glad, NP    05/04/2022 Imaging    IMPRESSION: Cervical spondylosis, as described.   Nonspecific straightening of the expected cervical lordosis.   07/24/2022 Imaging    IMPRESSION: 1. Extensive osseous metastatic disease with pathologic fracture at the base of dens and C2 right lateral mass. Extraosseous tumor at C1 and C2 likely impinging on the right C2 and C3 nerve roots. 2. Degenerative cord impingement at C4-5 to C6-7. Biforaminal impingement at C5-6 and C6-7.   08/03/2022 PET scan    IMPRESSION: 1. Large volume osseous metastasis. 2. Low right cervical and probable right axillary nodal metastasis. 3. Subtle heterogeneous activity throughout the liver with suggestion of small liver lesions (likely new compared to chest CT of 07/16/2020). Findings are overall moderately suspicious for hepatic metastasis. Pre and post contrast abdominal MRI (preferred) or CT could confirm. 4. Right-sided pleural thickening and trace pleural fluid. Right base airspace disease is favored to represent chronic atelectasis. 5. Aortic atherosclerosis (ICD10-I70.0) and emphysema (ICD10-J43.9).     08/11/2022 Genetic Testing   Negative genetic testing on the CancerNext-Expanded+RNAinsight panel.  The report date is August 11, 2022.  The CancerNext-Expanded gene panel offered by [facility] and includes sequencing and rearrangement analysis for the following 77 genes: AIP, ALK, APC*, ATM*, AXIN2, BAP1, BARD1, BLM, BMPR1A, BRCA1*, BRCA2*, BRIP1*, CDC73, CDH1*, CDK4, CDKN1B, CDKN2A, CHEK2*, CTNNA1, DICER1, FANCC, FH, FLCN, GALNT12, KIF1B, LZTR1, MAX, MEN1, MET, MLH1*, MSH2*, MSH3, MSH6*, MUTYH*, NBN, NF1*, NF2, NTHL1, PALB2*, PHOX2B, PMS2*, POT1, PRKAR1A, PTCH1, PTEN*, RAD51C*, RAD51D*, RB1, RECQL, RET, SDHA, SDHAF2, SDHB, SDHC, SDHD, SMAD4, SMARCA4, SMARCB1, SMARCE1,  STK11, SUFU, TMEM127, TP53*, TSC1, TSC2, VHL and XRCC2 (sequencing and deletion/duplication); EGFR, EGLN1, HOXB13, KIT, MITF, PDGFRA, POLD1, and POLE (sequencing only); EPCAM and GREM1 (deletion/duplication only). DNA and RNA analyses performed for * genes.    Lobular carcinoma in situ (LCIS) of right breast  01/20/2020 Mammogram   Diagnostic Mammogram 01/20/20 IMPRESSION: 1.  Stable post lumpectomy changes of the left breast.   2. Suspicious microcalcifications over the right upper outer quadrant spanning 3.6 cm.   02/02/2020 Initial Biopsy   Diagnosis 02/02/20 Breast, right, needle core biopsy, upper outer quadrant, x clip - LOBULAR CARCINOMA IN SITU WITH PLEOMORPHIC FEATURES AND CALCIFICATIONS, INVOLVING ADENOSIS. SEE NOTE Diagnosis Note Immunohistochemical stain for E-cadherin is negative in the lesional cells, consistent with a lobular phenotype. Immunostains for p63, SMM 1 and calponin do not show evidence of invasive carcinoma.    02/04/2020 Initial Diagnosis   Lobular carcinoma in situ (LCIS) of right breast   03/18/2020 Surgery   RIGHT BREAST LUMPECTOMY WITH RADIOACTIVE SEED LOCALIZATION by Dr Alvira Monday    03/18/2020 Pathology Results   FINAL MICROSCOPIC DIAGNOSIS:   A. BREAST, RIGHT, LUMPECTOMY:  - Pleomorphic lobular carcinoma in situ with calcifications and  underlying complex sclerosing lesion, adenosis and fibroadenomatoid  change.  - Margins of resection are not involved (Closest margins: < 1 mm,  anterior, posterior, inferior and medial).  - Biopsy site.    COMMENT:   P63, Calponin and SMM-1 demonstrate the presence of myoepithelium in the  select focus.    Metastatic breast cancer to bone and brain  05/04/2022 Imaging    IMPRESSION: Cervical  spondylosis, as described.   Nonspecific straightening of the expected cervical lordosis.   07/24/2022 Imaging    IMPRESSION: 1. Extensive osseous metastatic disease with pathologic fracture at the base of dens and C2  right lateral mass. Extraosseous tumor at C1 and C2 likely impinging on the right C2 and C3 nerve roots. 2. Degenerative cord impingement at C4-5 to C6-7. Biforaminal impingement at C5-6 and C6-7.   08/02/2022 Initial Diagnosis   Cancer, metastatic to bone ([name])   08/03/2022 PET scan    IMPRESSION: 1. Large volume osseous metastasis. 2. Low right cervical and probable right axillary nodal metastasis. 3. Subtle heterogeneous activity throughout the liver with suggestion of small liver lesions (likely new compared to chest CT of 07/16/2020). Findings are overall moderately suspicious for hepatic metastasis. Pre and post contrast abdominal MRI (preferred) or CT could confirm. 4. Right-sided pleural thickening and trace pleural fluid. Right base airspace disease is favored to represent chronic atelectasis. 5. Aortic atherosclerosis (ICD10-I70.0) and emphysema (ICD10-J43.9).     10/26/2022 - 04/12/2023 Chemotherapy   Patient is on Treatment Plan : BREAST Paclitaxel D1,8,15 + Trastuzumab D1 + Pertuzumab D1 q21d x 8 cycles / Trastuzumab D1 + Pertuzumab D1 q21d x 4 cycles     05/03/2023 -  Chemotherapy   Patient is on Treatment Plan : BREAST MAINTENANCE Trastuzumab IV (6) or SQ (600) D1 q21d X 11 Cycles        Discussed the use of AI scribe software for clinical note transcription with the patient, who gave verbal consent to proceed.  History of Present Illness   A 63 year old patient with a known diagnosis of metastatic breast cancer presents for a follow-up visit. She reports significant improvement in her functional status and minimal pain. She has been able to resume her usual activities, including driving. She is currently on oral medications, Decadron and Xeloda, which she tolerates well. She does not require anti-nausea medication before taking Decadron, indicating well-controlled nausea. The patient also has bone metastasis and is due for Zometa treatment at the end of April. A recent  PET scan showed stable disease, suggesting the current therapy is effective.         All other systems were reviewed with the patient and are negative.  MEDICAL HISTORY:  Past Medical History:  Diagnosis Date   Anxiety    Cancer ([name]) 2022   right breast LCIS   Cancer ([name]) 2019   left breast   Depression    Dysrhythmia    SVT, s/p ablation ~ 2012 in at [facility]   Ectopic pregnancy    Family history of breast cancer    Family history of melanoma    History of radiation therapy 04/22/18-06/03/18   Left Breast, left SCV, axilla 50 Gy in 25 fractions, Left breast boost 10 Gy in 5 fractions.    Hypothyroidism    Personal history of radiation therapy    PONV (postoperative nausea and vomiting)    Thyroid disease     SURGICAL HISTORY: Past Surgical History:  Procedure Laterality Date   APPENDECTOMY     BILATERAL SALPINGECTOMY     BREAST BIOPSY Right 2014   fibroadenoma   BREAST BIOPSY Left 01/16/2018   BREAST BIOPSY Right 02/02/2020   LCIS   BREAST BIOPSY Right 08/04/2020   x2 LCIS   BREAST BIOPSY Right 08/13/2020   x2   BREAST EXCISIONAL BIOPSY Left 1990   benign   BREAST EXCISIONAL BIOPSY Right 02/02/2020   LCIS   BREAST  LUMPECTOMY Left 01/22/2018   BREAST LUMPECTOMY WITH AXILLARY LYMPH NODE BIOPSY Left 02/21/2018   Procedure: LEFT BREAST LUMPECTOMY WITH AXILLARY LYMPH NODE BIOPSY;  Surgeon: Almond Lint, MD;  Location: [facility] OR;  Service: General;  Laterality: Left;   BREAST LUMPECTOMY WITH RADIOACTIVE SEED LOCALIZATION Right 03/18/2020   Procedure: RIGHT BREAST LUMPECTOMY WITH RADIOACTIVE SEED LOCALIZATION;  Surgeon: Almond Lint, MD;  Location: [facility] OR;  Service: General;  Laterality: Right;   BREAST SURGERY Left 1993   cyst removed   ENDOMETRIAL ABLATION     EYE SURGERY     GLAUCOMA SURGERY Bilateral    IR IMAGING GUIDED PORT INSERTION  10/16/2022   POSTERIOR CERVICAL FUSION/FORAMINOTOMY N/A 08/17/2022   Procedure: Cervical One-Cervical Four  POSTERIOR CERVICAL FUSION,  Cervical One LAMINECTOMY REDUCTION OF Cervical Two Fracture, Biopsy of Right Cerical Two Pars  Lesion;  Surgeon: Bedelia Person, MD;  Location: [facility] OR;  Service: Neurosurgery;  Laterality: N/A;   POSTERIOR CERVICAL FUSION/FORAMINOTOMY N/A 09/06/2022   Procedure: Posterior Cervical Fusion, Foraminotomy , Cervical Five-Six, Cervical Six-Seven; Extension of  fusion Cervical Four- Thoracic One;  Surgeon: Bedelia Person, MD;  Location: [facility] OR;  Service: Neurosurgery;  Laterality: N/A;   RADIOACTIVE SEED GUIDED EXCISIONAL BREAST BIOPSY Right 04/28/2021   Procedure: RADIOACTIVE SEED GUIDED EXCISIONAL RIGHT BREAST BIOPSY X2;  Surgeon: Almond Lint, MD;  Location: [facility];  Service: General;  Laterality: Right;   RE-EXCISION OF BREAST LUMPECTOMY Left 03/20/2018   Procedure: RE-EXCISION OF BREAST LUMPECTOMY;  Surgeon: Almond Lint, MD;  Location: [facility];  Service: General;  Laterality: Left;    I have reviewed the social history and family history with the patient and they are unchanged from previous note.  ALLERGIES:  is allergic to morphine and codeine.  MEDICATIONS:  Current Outpatient Medications  Medication Sig Dispense Refill   acetaminophen (TYLENOL) 500 MG tablet Take 2 tablets (1,000 mg total) by mouth every 8 (eight) hours. 30 tablet 0   capecitabine (XELODA) 500 MG tablet TAKE 2 TABLETS TWICE DAILY FOR 7 DAYS ON THEN 7 DAYS OFF. TAKE AFTER MEALS 56 tablet 2   citalopram (CELEXA) 40 MG tablet TAKE 1 TABLET BY MOUTH EVERY DAY 90 tablet 2   diphenoxylate-atropine (LOMOTIL) 2.5-0.025 MG tablet Take 1 tablet by mouth 4 (four) times daily as needed for diarrhea or loose stools. 90 tablet 0   levothyroxine (SYNTHROID) 75 MCG tablet Take 1 tablet (75 mcg total) by mouth daily before breakfast. 90 tablet 3   lidocaine (XYLOCAINE) 2 % solution Patient: Mix 1part 2% viscous lidocaine, 1part H20. Swallow 10mL of diluted mixture,  before meals and at bedtime, up to QID 100 mL 3   loperamide (IMODIUM A-D) 2 MG tablet Take 1 tablet (2 mg total) by mouth 4 (four) times daily as needed for diarrhea or loose stools. 90 tablet 0   LORazepam (ATIVAN) 0.5 MG tablet Take 1 tablet (0.5 mg total) by mouth every 6 (six) hours as needed for anxiety (agitation). 30 tablet 0   Mouthwashes (MOUTH RINSE) LIQD solution 15 mLs by Mouth Rinse route as needed (for oral care).  0   nicotine (NICODERM CQ - DOSED IN MG/24 HR) 7 mg/24hr patch Place 1 patch (7 mg total) onto the skin daily. 28 patch 0   ondansetron (ZOFRAN) 8 MG tablet Take 1 tablet (8 mg total) by mouth every 8 (eight) hours as needed for nausea or vomiting. 20 tablet 1   OVER THE COUNTER MEDICATION Take  1 tablet by mouth daily. Protandim Supplement     pregabalin (LYRICA) 25 MG capsule Take 1 capsule (25 mg total) by mouth 2 (two) times daily. 60 capsule 2   senna-docusate (SENOKOT-S) 8.6-50 MG tablet Take 1 tablet by mouth 2 (two) times daily. 60 tablet 0   tucatinib (TUKYSA) 150 MG tablet Take 2 tablets (300 mg total) by mouth 2 (two) times daily. Take as instructed per MD. 120 tablet 2   No current facility-administered medications for this visit.   Facility-Administered Medications Ordered in Other Visits  Medication Dose Route Frequency Provider Last Rate Last Admin   alteplase (CATHFLO ACTIVASE) injection 2 mg  2 mg Intracatheter Once PRN Malachy Mood, MD       heparin lock flush 100 unit/mL  250 Units Intracatheter Once PRN Malachy Mood, MD       sodium chloride flush (NS) 0.9 % injection 10 mL  10 mL Intracatheter PRN Malachy Mood, MD   10 mL at 11/29/23 1142   sodium chloride flush (NS) 0.9 % injection 3 mL  3 mL Intracatheter PRN Malachy Mood, MD        PHYSICAL EXAMINATION: ECOG PERFORMANCE STATUS: 2  Vitals:   11/29/23 0959  BP: 116/66  Pulse: 80  Resp: 16  Temp: 98.8 F (37.1 C)  SpO2: 100%   Wt Readings from Last 3 Encounters:  11/29/23 75 lb (34 kg)  11/08/23 73  lb 8 oz (33.3 kg)  11/08/23 73 lb 6.4 oz (33.3 kg)     GENERAL:alert, no distress and comfortable SKIN: skin color, texture, turgor are normal, no rashes or significant lesions EYES: normal, Conjunctiva are pink and non-injected, sclera clear NECK: supple, thyroid normal size, non-tender, without nodularity LYMPH:  no palpable lymphadenopathy in the cervical, axillary  LUNGS: clear to auscultation and percussion with normal breathing effort HEART: regular rate & rhythm and no murmurs and no lower extremity edema ABDOMEN:abdomen soft, non-tender and normal bowel sounds Musculoskeletal:no cyanosis of digits and no clubbing  NEURO: alert & oriented x 3 with fluent speech, no focal motor/sensory deficits    LABORATORY DATA:  I have reviewed the data as listed    Latest Ref Rng & Units 11/29/2023    9:44 AM 11/08/2023   11:11 AM 10/18/2023   11:21 AM  CBC  WBC 4.0 - 10.5 K/uL 6.8  6.0  7.3   Hemoglobin 12.0 - 15.0 g/dL 14.7  82.9  56.2   Hematocrit 36.0 - 46.0 % 41.2  43.7  44.5   Platelets 150 - 400 K/uL 180  202  217         Latest Ref Rng & Units 11/29/2023    9:44 AM 11/08/2023   11:11 AM 10/18/2023   11:21 AM  CMP  Glucose 70 - 99 mg/dL 92  130  93   BUN 8 - 23 mg/dL 9  9  14    Creatinine 0.44 - 1.00 mg/dL 8.65  7.84  6.96   Sodium 135 - 145 mmol/L 138  137  137   Potassium 3.5 - 5.1 mmol/L 4.1  3.6  4.2   Chloride 98 - 111 mmol/L 102  102  102   CO2 22 - 32 mmol/L 32  31  33   Calcium 8.9 - 10.3 mg/dL 9.1  9.0  9.5   Total Protein 6.5 - 8.1 g/dL 6.6  6.8  6.6   Total Bilirubin 0.0 - 1.2 mg/dL 0.8  0.8  0.7   Alkaline Phos 38 -  126 U/L 81  82  99   AST 15 - 41 U/L 16  15  27    ALT 0 - 44 U/L 10  11  15        RADIOGRAPHIC STUDIES: I have personally reviewed the radiological images as listed and agreed with the findings in the report. No results found.    No orders of the defined types were placed in this encounter.  All questions were answered. The patient knows  to call the clinic with any problems, questions or concerns. No barriers to learning was detected. The total time spent in the appointment was 30 minutes.     Malachy Mood, MD 11/29/2023

## 2023-11-29 NOTE — Patient Instructions (Signed)
 [facility] - A DEPT OF [facility]  Discharge Instructions: Thank you for choosing [facility] to provide your oncology and hematology care.   If you have a lab appointment with the Cancer Center, please go directly to the Cancer Center and check in at the registration area.   Wear comfortable clothing and clothing appropriate for easy access to any Portacath or PICC line.   We strive to give you quality time with your provider. You may need to reschedule your appointment if you arrive late (15 or more minutes).  Arriving late affects you and other patients whose appointments are after yours.  Also, if you miss three or more appointments without notifying the office, you may be dismissed from the clinic at the provider's discretion.      For prescription refill requests, have your pharmacy contact our office and allow 72 hours for refills to be completed.    Today you received the following chemotherapy and/or immunotherapy agents: Kanjinti      To help prevent nausea and vomiting after your treatment, we encourage you to take your nausea medication as directed.  BELOW ARE SYMPTOMS THAT SHOULD BE REPORTED IMMEDIATELY: *FEVER GREATER THAN 100.4 F (38 C) OR HIGHER *CHILLS OR SWEATING *NAUSEA AND VOMITING THAT IS NOT CONTROLLED WITH YOUR NAUSEA MEDICATION *UNUSUAL SHORTNESS OF BREATH *UNUSUAL BRUISING OR BLEEDING *URINARY PROBLEMS (pain or burning when urinating, or frequent urination) *BOWEL PROBLEMS (unusual diarrhea, constipation, pain near the anus) TENDERNESS IN MOUTH AND THROAT WITH OR WITHOUT PRESENCE OF ULCERS (sore throat, sores in mouth, or a toothache) UNUSUAL RASH, SWELLING OR PAIN  UNUSUAL VAGINAL DISCHARGE OR ITCHING   Items with * indicate a potential emergency and should be followed up as soon as possible or go to the Emergency Department if any problems should occur.  Please show the CHEMOTHERAPY ALERT CARD or IMMUNOTHERAPY  ALERT CARD at check-in to the Emergency Department and triage nurse.  Should you have questions after your visit or need to cancel or reschedule your appointment, please contact [facility] - A DEPT OF [facility]  Dept: [phone]  and follow the prompts.  Office hours are 8:00 a.m. to 4:30 p.m. Monday - Friday. Please note that voicemails left after 4:00 p.m. may not be returned until the following business day.  We are closed weekends and major holidays. You have access to a nurse at all times for urgent questions. Please call the main number to the clinic Dept: [phone] and follow the prompts.   For any non-urgent questions, you may also contact your provider using MyChart. We now offer e-Visits for anyone 60 and older to request care online for non-urgent symptoms. For details visit [email or web address].   Also download the MyChart app! Go to the app store, search "MyChart", open the app, select [facility], and log in with your MyChart username and password.

## 2023-11-29 NOTE — Progress Notes (Signed)
 Palliative Medicine [facility]  Telephone:([phone]) [phone] Fax:([phone]) [phone]   Name: Martha [name] Date: 11/29/2023 MRN: [id number]  DOB: 09/07/1960  Patient Care Team: Ardith Dark, MD as PCP - General (Family Medicine) Almond Lint, MD as Consulting Physician (General Surgery) Malachy Mood, MD as Consulting Physician (Hematology) Lonie Peak, MD as Attending Physician (Radiation Oncology) Pollyann Samples, NP as Nurse Practitioner (Nurse Practitioner)    INTERVAL HISTORY: Martha [name] is a 62 y.o. female with oncologic medical history including ER+ breast cancer (01/2018), right breast cancer (04/2021), now with metastatic disease progression involving brain and liver, pathological fractures with osseous mets s/p brain radiation. .  Palliative ask to see for symptom management and goals of care.   SOCIAL HISTORY:    Martha [name] reports that she has been smoking cigarettes. She has a 20 pack-year smoking history. She has never used smokeless tobacco. She reports that she does not currently use alcohol after a past usage of about 28.0 standard drinks of alcohol per week. She reports that she does not use drugs.  ADVANCE DIRECTIVES:  Has documents on file  CODE STATUS: DNR  PAST MEDICAL HISTORY: Past Medical History:  Diagnosis Date   Anxiety    Cancer ([name]) 2022   right breast LCIS   Cancer ([name]) 2019   left breast   Depression    Dysrhythmia    SVT, s/p ablation ~ 2012 in at [facility]   Ectopic pregnancy    Family history of breast cancer    Family history of melanoma    History of radiation therapy 04/22/18-06/03/18   Left Breast, left SCV, axilla 50 Gy in 25 fractions, Left breast boost 10 Gy in 5 fractions.    Hypothyroidism    Personal history of radiation therapy    PONV (postoperative nausea and vomiting)    Thyroid disease     ALLERGIES:  is allergic to morphine and codeine.  MEDICATIONS:  Current Outpatient  Medications  Medication Sig Dispense Refill   acetaminophen (TYLENOL) 500 MG tablet Take 2 tablets (1,000 mg total) by mouth every 8 (eight) hours. 30 tablet 0   capecitabine (XELODA) 500 MG tablet TAKE 2 TABLETS TWICE DAILY FOR 7 DAYS ON THEN 7 DAYS OFF. TAKE AFTER MEALS 56 tablet 2   citalopram (CELEXA) 40 MG tablet TAKE 1 TABLET BY MOUTH EVERY DAY 90 tablet 2   diphenoxylate-atropine (LOMOTIL) 2.5-0.025 MG tablet Take 1 tablet by mouth 4 (four) times daily as needed for diarrhea or loose stools. 90 tablet 0   levothyroxine (SYNTHROID) 75 MCG tablet Take 1 tablet (75 mcg total) by mouth daily before breakfast. 90 tablet 3   lidocaine (XYLOCAINE) 2 % solution Patient: Mix 1part 2% viscous lidocaine, 1part H20. Swallow 10mL of diluted mixture, before meals and at bedtime, up to QID 100 mL 3   loperamide (IMODIUM A-D) 2 MG tablet Take 1 tablet (2 mg total) by mouth 4 (four) times daily as needed for diarrhea or loose stools. 90 tablet 0   LORazepam (ATIVAN) 0.5 MG tablet Take 1 tablet (0.5 mg total) by mouth every 6 (six) hours as needed for anxiety (agitation). 30 tablet 0   Mouthwashes (MOUTH RINSE) LIQD solution 15 mLs by Mouth Rinse route as needed (for oral care).  0   nicotine (NICODERM CQ - DOSED IN MG/24 HR) 7 mg/24hr patch Place 1 patch (7 mg total) onto the skin daily. 28 patch 0   ondansetron (ZOFRAN)  8 MG tablet Take 1 tablet (8 mg total) by mouth every 8 (eight) hours as needed for nausea or vomiting. 20 tablet 1   OVER THE COUNTER MEDICATION Take 1 tablet by mouth daily. Protandim Supplement     pregabalin (LYRICA) 25 MG capsule Take 1 capsule (25 mg total) by mouth 2 (two) times daily. 60 capsule 2   senna-docusate (SENOKOT-S) 8.6-50 MG tablet Take 1 tablet by mouth 2 (two) times daily. 60 tablet 0   tucatinib (TUKYSA) 150 MG tablet Take 2 tablets (300 mg total) by mouth 2 (two) times daily. Take as instructed per MD. 120 tablet 2   No current facility-administered medications  for this visit.   Facility-Administered Medications Ordered in Other Visits  Medication Dose Route Frequency Provider Last Rate Last Admin   alteplase (CATHFLO ACTIVASE) injection 2 mg  2 mg Intracatheter Once PRN Malachy Mood, MD       heparin lock flush 100 unit/mL  500 Units Intracatheter Once PRN Malachy Mood, MD       heparin lock flush 100 unit/mL  250 Units Intracatheter Once PRN Malachy Mood, MD       sodium chloride flush (NS) 0.9 % injection 10 mL  10 mL Intracatheter PRN Malachy Mood, MD       sodium chloride flush (NS) 0.9 % injection 3 mL  3 mL Intracatheter PRN Malachy Mood, MD        VITAL SIGNS: There were no vitals taken for this visit. There were no vitals filed for this visit.  Estimated body mass index is 13.72 kg/m as calculated from the following:   Height as of an earlier encounter on 11/29/23: 5\' 2"  (1.575 m).   Weight as of an earlier encounter on 11/29/23: 75 lb (34 kg).   PERFORMANCE STATUS (ECOG) : 3 - Symptomatic, >50% confined to bed   Physical Exam General: NAD, thin   Cardiovascular: regular rate and rhythm Pulmonary: normal breathing pattern Extremities: no edema, no joint deformities Skin: no rashes, muscle wasting  Neurological: alert, oriented x 4  IMPRESSION:  Martha [name] presents to clinic for follow-up. No acute distress. She is accompanied by her husband, Martha [name]. No acute distress. Denies concerns with nausea, vomiting, constipation, or diarrhea. Is remaining active. Continues to get situated in their new home. Occasional diarrhea which is controlled with Imodium or Lomotil. Her appetite is improving. Weight is up to 75lbs from 73lbs on 3/13.   Denies pain or discomfort. What discomfort she does have which is neuropathic in nature is well controlled with Lyrica. She takes Tylenol as needed for minor aches. They have installed grab bars in the shower and she has been able to shower independently. Looking forward to spending some time outside as weather gets  warmer.   We will continue to support and follow as needed.  PLAN: Continue intake of Ensure and soft, high-calorie foods. Continue Lyrica 25 mg twice daily. Tylenol as needed for mild aches and pain.  Ongoing goals of care discussions and symptom management as needed. Palliative will plan to see patient back in 4-6 weeks in collaboration to other oncology appointments.  Patient and husband knows to contact office sooner if needed.  Patient expressed understanding and was in agreement with this plan. She also understands that She can call the clinic at any time with any questions, concerns, or complaints.    Any controlled substances utilized were prescribed in the context of palliative care. PDMP has been reviewed.   Visit consisted of  counseling and education dealing with the complex and emotionally intense issues of symptom management and palliative care in the setting of serious and potentially life-threatening illness.  Willette Alma, AGPCNP-BC  Palliative Medicine Team/[facility]

## 2023-11-29 NOTE — Assessment & Plan Note (Signed)
-  Diagnosed in 06/2022, ER-/PR-/HER2+ --PET scan from 08/03/2022 showed diffuse bone mets  -she underwent cervical laminectomy and fusion by Dr. Maisie Fus on 08/17/2022, biopsy confirmed metastatic breast cancer, ER/PR negative, HER2 positive. -due to cervical cord compression with myelopathy, she underwent a second cervical spine surgery on September 06, 2022, and completed inpatient rehabitation. -She has completed brain, cervical and lumbar spine radiation in Feb 2024 -She was recently hospitalized for altered mental status and confusion, probably secondary to medication -She started first line systemic chemotherapy with weekly Taxol and trastuzumab/Perjeta on 10/25/22. She has tolerated well so far and her pain has improved overall -PET scan from Jan 01, 2023 showed complete metabolic response, no hypermetabolic uptake in diffuse bone lesions, no other new lesions.  She is clinically also doing much better.  Given her excellent response to chemo, we will change her Taxol to 2 weeks on and 1 week off -She Unfortunately developed more more brain metastasis on recent brain MRI. -I personally reviewed her restaging PET scan from April 13, 2023, which showed stable disease, no new lesions. -Due to her new brain metastasis, I changed her therapy to Tucatinib, capecitabine and trastuzumab, for better brain penetration. She started in late August 2024, tolerating well so far.  Her case was reviewed in CNS tumor board, Dr. Basilio Cairo plan to hold on whole brain radiation since she is starting new Her2 antibody and monitor her brain mets closely.  -restaging MRI brian 08/30/2023 showed partial response, PET in 06/2023 showed no hypermetabolic lesions  -she is also on Zometa every 3 months

## 2023-12-03 ENCOUNTER — Other Ambulatory Visit: Payer: Self-pay | Admitting: Nurse Practitioner

## 2023-12-03 ENCOUNTER — Other Ambulatory Visit: Payer: Self-pay

## 2023-12-03 DIAGNOSIS — Z515 Encounter for palliative care: Secondary | ICD-10-CM

## 2023-12-03 DIAGNOSIS — C7951 Secondary malignant neoplasm of bone: Secondary | ICD-10-CM

## 2023-12-03 DIAGNOSIS — R197 Diarrhea, unspecified: Secondary | ICD-10-CM

## 2023-12-03 MED ORDER — DIPHENOXYLATE-ATROPINE 2.5-0.025 MG PO TABS: 1.0000 | ORAL_TABLET | Freq: Four times a day (QID) | ORAL | 3 refills | Status: DC | PRN

## 2023-12-19 ENCOUNTER — Ambulatory Visit (HOSPITAL_COMMUNITY)
Admission: RE | Admit: 2023-12-19 | Discharge: 2023-12-19 | Disposition: A | Discharge status: Home / Self Care | Source: Ambulatory Visit | Attending: Hematology | Admitting: Hematology

## 2023-12-19 ENCOUNTER — Other Ambulatory Visit: Payer: Self-pay | Admitting: Hematology

## 2023-12-19 DIAGNOSIS — C7951 Secondary malignant neoplasm of bone: Secondary | ICD-10-CM | POA: Diagnosis not present

## 2023-12-19 DIAGNOSIS — Z5111 Encounter for antineoplastic chemotherapy: Secondary | ICD-10-CM | POA: Insufficient documentation

## 2023-12-19 DIAGNOSIS — C7931 Secondary malignant neoplasm of brain: Secondary | ICD-10-CM | POA: Insufficient documentation

## 2023-12-19 DIAGNOSIS — Z0189 Encounter for other specified special examinations: Secondary | ICD-10-CM | POA: Diagnosis not present

## 2023-12-19 DIAGNOSIS — I358 Other nonrheumatic aortic valve disorders: Secondary | ICD-10-CM | POA: Insufficient documentation

## 2023-12-19 LAB — ECHOCARDIOGRAM COMPLETE
Area-P 1/2: 2.62 cm2
Calc EF: 64.7 %
S' Lateral: 2.1 cm
Single Plane A2C EF: 64.1 %
Single Plane A4C EF: 66.3 %

## 2023-12-19 NOTE — Progress Notes (Signed)
  Echocardiogram 2D Echocardiogram has been performed.  Royden Corin 12/19/2023, 12:01 PM

## 2023-12-19 NOTE — Assessment & Plan Note (Signed)
 She was diagnosed in 12/2017. ER+/PR+/HER2- -She is s/p left breast lumpectomy and adjuvant radiation.  -She started anti-estrogen therapy with letrozole on 05/2018. Tolerating well with no issues -lost f/u after visit in 04/2020 until her recurrence in 07/2022

## 2023-12-19 NOTE — Assessment & Plan Note (Signed)
-  Diagnosed in 06/2022, ER-/PR-/HER2+ --PET scan from 08/03/2022 showed diffuse bone mets  -she underwent cervical laminectomy and fusion by Dr. Maisie Fus on 08/17/2022, biopsy confirmed metastatic breast cancer, ER/PR negative, HER2 positive. -due to cervical cord compression with myelopathy, she underwent a second cervical spine surgery on September 06, 2022, and completed inpatient rehabitation. -She has completed brain, cervical and lumbar spine radiation in Feb 2024 -She was recently hospitalized for altered mental status and confusion, probably secondary to medication -She started first line systemic chemotherapy with weekly Taxol and trastuzumab/Perjeta on 10/25/22. She has tolerated well so far and her pain has improved overall -PET scan from Jan 01, 2023 showed complete metabolic response, no hypermetabolic uptake in diffuse bone lesions, no other new lesions.  She is clinically also doing much better.  Given her excellent response to chemo, we will change her Taxol to 2 weeks on and 1 week off -She Unfortunately developed more more brain metastasis on recent brain MRI. -I personally reviewed her restaging PET scan from April 13, 2023, which showed stable disease, no new lesions. -Due to her new brain metastasis, I changed her therapy to Tucatinib, capecitabine and trastuzumab, for better brain penetration. She started in late August 2024, tolerating well so far.  Her case was reviewed in CNS tumor board, Dr. Basilio Cairo plan to hold on whole brain radiation since she is starting new Her2 antibody and monitor her brain mets closely.  -restaging MRI brian 08/30/2023 showed partial response, PET in 06/2023 showed no hypermetabolic lesions  -she is also on Zometa every 3 months

## 2023-12-20 ENCOUNTER — Inpatient Hospital Stay (HOSPITAL_BASED_OUTPATIENT_CLINIC_OR_DEPARTMENT_OTHER): Payer: Self-pay | Admitting: Hematology

## 2023-12-20 ENCOUNTER — Inpatient Hospital Stay: Payer: Self-pay

## 2023-12-20 ENCOUNTER — Encounter: Payer: Self-pay | Admitting: Hematology

## 2023-12-20 VITALS — BP 118/78 | HR 79 | Temp 98.9°F | Resp 20 | Ht 62.0 in | Wt 74.9 lb

## 2023-12-20 DIAGNOSIS — Z170 Estrogen receptor positive status [ER+]: Secondary | ICD-10-CM

## 2023-12-20 DIAGNOSIS — Z171 Estrogen receptor negative status [ER-]: Secondary | ICD-10-CM | POA: Diagnosis not present

## 2023-12-20 DIAGNOSIS — C50312 Malignant neoplasm of lower-inner quadrant of left female breast: Secondary | ICD-10-CM

## 2023-12-20 DIAGNOSIS — Z5112 Encounter for antineoplastic immunotherapy: Secondary | ICD-10-CM | POA: Diagnosis not present

## 2023-12-20 DIAGNOSIS — C7951 Secondary malignant neoplasm of bone: Secondary | ICD-10-CM

## 2023-12-20 DIAGNOSIS — Z79899 Other long term (current) drug therapy: Secondary | ICD-10-CM | POA: Diagnosis not present

## 2023-12-20 DIAGNOSIS — Z95828 Presence of other vascular implants and grafts: Secondary | ICD-10-CM

## 2023-12-20 DIAGNOSIS — C7931 Secondary malignant neoplasm of brain: Secondary | ICD-10-CM | POA: Diagnosis not present

## 2023-12-20 LAB — CMP (CANCER CENTER ONLY)
ALT: 11 U/L (ref 0–44)
AST: 18 U/L (ref 15–41)
Albumin: 4.4 g/dL (ref 3.5–5.0)
Alkaline Phosphatase: 94 U/L (ref 38–126)
Anion gap: 1 — ABNORMAL LOW (ref 5–15)
BUN: 8 mg/dL (ref 8–23)
CO2: 33 mmol/L — ABNORMAL HIGH (ref 22–32)
Calcium: 9.3 mg/dL (ref 8.9–10.3)
Chloride: 103 mmol/L (ref 98–111)
Creatinine: 0.49 mg/dL (ref 0.44–1.00)
GFR, Estimated: >60 mL/min (ref >60)
Glucose, Bld: 92 mg/dL (ref 70–99)
Potassium: 3.9 mmol/L (ref 3.5–5.1)
Sodium: 137 mmol/L (ref 135–145)
Total Bilirubin: 0.6 mg/dL (ref 0.0–1.2)
Total Protein: 6.7 g/dL (ref 6.5–8.1)

## 2023-12-20 LAB — CBC WITH DIFFERENTIAL (CANCER CENTER ONLY)
Abs Immature Granulocytes: 0.03 10*3/uL (ref 0.00–0.07)
Basophils Absolute: 0 10*3/uL (ref 0.0–0.1)
Basophils Relative: 1 %
Eosinophils Absolute: 0.1 10*3/uL (ref 0.0–0.5)
Eosinophils Relative: 1 %
HCT: 41.8 % (ref 36.0–46.0)
Hemoglobin: 14.7 g/dL (ref 12.0–15.0)
Immature Granulocytes: 1 %
Lymphocytes Relative: 17 %
Lymphs Abs: 1 10*3/uL (ref 0.7–4.0)
MCH: 35.2 pg — ABNORMAL HIGH (ref 26.0–34.0)
MCHC: 35.2 g/dL (ref 30.0–36.0)
MCV: 100 fL (ref 80.0–100.0)
Monocytes Absolute: 0.5 10*3/uL (ref 0.1–1.0)
Monocytes Relative: 9 %
Neutro Abs: 4 10*3/uL (ref 1.7–7.7)
Neutrophils Relative %: 71 %
Platelet Count: 212 10*3/uL (ref 150–400)
RBC: 4.18 MIL/uL (ref 3.87–5.11)
RDW: 15.4 % (ref 11.5–15.5)
WBC Count: 5.6 10*3/uL (ref 4.0–10.5)
nRBC: 0 % (ref 0.0–0.2)

## 2023-12-20 MED ORDER — ZOLEDRONIC ACID 4 MG/100ML IV SOLN
4.0000 mg | Freq: Once | INTRAVENOUS | Status: AC
  Administered 2023-12-20: 4 mg via INTRAVENOUS
  Filled 2023-12-20: qty 100

## 2023-12-20 MED ORDER — SODIUM CHLORIDE 0.9 % IV SOLN: Freq: Once | INTRAVENOUS | Status: DC

## 2023-12-20 MED ORDER — TRASTUZUMAB-ANNS CHEMO 150 MG IV SOLR
6.0000 mg/kg | Freq: Once | INTRAVENOUS | Status: AC
  Administered 2023-12-20: 210 mg via INTRAVENOUS
  Filled 2023-12-20: qty 10

## 2023-12-20 MED ORDER — SODIUM CHLORIDE 0.9% FLUSH
10.0000 mL | Freq: Once | INTRAVENOUS | Status: AC
  Administered 2023-12-20: 10 mL

## 2023-12-20 MED ORDER — SODIUM CHLORIDE 0.9 % IV SOLN: Freq: Once | INTRAVENOUS | Status: AC

## 2023-12-20 MED ORDER — ACETAMINOPHEN 325 MG PO TABS
650.0000 mg | ORAL_TABLET | Freq: Once | ORAL | Status: AC
  Administered 2023-12-20: 650 mg via ORAL
  Filled 2023-12-20: qty 2

## 2023-12-20 MED ORDER — DIPHENHYDRAMINE HCL 25 MG PO CAPS
50.0000 mg | ORAL_CAPSULE | Freq: Once | ORAL | Status: AC
Start: 2023-12-20 — End: 2023-12-20
  Administered 2023-12-20: 50 mg via ORAL
  Filled 2023-12-20: qty 2

## 2023-12-20 NOTE — Progress Notes (Signed)
 [facility]   [phone] [phone]   Clinic Follow up Note   Patient Care Team: Rodney Clamp, MD as PCP - General (Family Medicine) Lockie Rima, MD as Consulting Physician (General Surgery) Sonja Kay, MD as Consulting Physician (Hematology) Colie Dawes, MD as Attending Physician (Radiation Oncology) [name], [name], NP as Nurse Practitioner (Nurse Practitioner)  Date of Service:  12/20/2023  CHIEF COMPLAINT: f/u of metastatic breast cancer  CURRENT THERAPY:  Xeloda , Tucatinib  and trastuzumab   Zometa  every 3 months  Oncology History   Metastatic breast cancer to bone and brain -Diagnosed in 06/2022, ER-/PR-/HER2+ --PET scan from 08/03/2022 showed diffuse bone mets  -she underwent cervical laminectomy and fusion by Dr. Andy Bannister on 08/17/2022, biopsy confirmed metastatic breast cancer, ER/PR negative, HER2 positive. -due to cervical cord compression with myelopathy, she underwent a second cervical spine surgery on September 06, 2022, and completed inpatient rehabitation. -She has completed brain, cervical and lumbar spine radiation in Feb 2024 -She was recently hospitalized for altered mental status and confusion, probably secondary to medication -She started first line systemic chemotherapy with weekly Taxol  and trastuzumab /Perjeta  on 10/25/22. She has tolerated well so far and her pain has improved overall -PET scan from Jan 01, 2023 showed complete metabolic response, no hypermetabolic uptake in diffuse bone lesions, no other new lesions.  She is clinically also doing much better.  Given her excellent response to chemo, we will change her Taxol  to 2 weeks on and 1 week off -She Unfortunately developed more more brain metastasis on recent brain MRI. -I personally reviewed her restaging PET scan from April 13, 2023, which showed stable disease, no new lesions. -Due to her new brain metastasis, I changed her therapy to Tucatinib , capecitabine  and  trastuzumab , for better brain penetration. She started in late August 2024, tolerating well so far.  Her case was reviewed in CNS tumor board, Dr. Lurena Sally plan to hold on whole brain radiation since she is starting new Her2 antibody and monitor her brain mets closely.  -restaging MRI brian 08/30/2023 showed partial response, PET in 06/2023 showed no hypermetabolic lesions  -she is also on Zometa  every 3 months    Malignant neoplasm of lower-inner quadrant of left breast in female, estrogen receptor positive ([name]) She was diagnosed in 12/2017. ER+/PR+/HER2- -She is s/p left breast lumpectomy and adjuvant radiation.  -She started anti-estrogen therapy with letrozole  on 05/2018. Tolerating well with no issues -lost f/u after visit in 04/2020 until her recurrence in 07/2022     Assessment & Plan Metastatic breast cancer Metastatic breast cancer is well-controlled with the current treatment regimen. Recent PET scan showed no significant activity, indicating effective management. She tolerates treatment well with stable blood counts and no significant side effects. Echocardiogram results are normal, indicating no cardiac side effects from HER2 antibody therapy. She reports improved appetite and weight stability, with efforts to gain weight through high-calorie supplements and diet. No significant pain reported, only occasional acetaminophen  use for muscle discomfort. Diarrhea is managed with loperamide , and neuropathy is improving. - Continue current treatment regimen with Tequinsa and chemotherapy every other week. - Schedule PET scan for July or early August, contingent on brain MRI results. - Administer Zemaira infusion today and continue every three months. - Start calcium and vitamin D supplementation, with double calcium intake for the next month. - Monitor cardiac function with echocardiogram every three months, potentially extending to every six months after a couple of years.  Peripheral  neuropathy Peripheral neuropathy is improving,  likely related to previous chemotherapy and neck surgery. She reports feeling stronger with fewer neuropathic symptoms. - Monitor neuropathy symptoms and continue current management.  Plan - She is tolerating treatment well, will continue current therapy - Lab and recent echo reviewed, will proceed trastuzumab  infusion today and continue every 3 weeks - Proceed with Zometa  today and continue every 3 months - Follow-up in 3 weeks   SUMMARY OF ONCOLOGIC HISTORY: Oncology History Overview Note  Cancer Staging Malignant neoplasm of lower-inner quadrant of left breast in female, estrogen receptor positive ([name]) Staging form: Breast, AJCC 8th Edition - Clinical stage from 01/16/2018: Stage IA (cT1c, cN0, cM0, G2, ER+, PR+, HER2-) - Signed by Sonja Evaro, MD on 01/23/2018 - Pathologic: Stage IA (pT1c, pN1, cM0, G1, ER+, PR+, HER2-) - Signed by Colie Dawes, MD on 04/09/2018     Malignant neoplasm of lower-inner quadrant of left breast in female, estrogen receptor positive ([name])  01/15/2018 Mammogram   Diagnositc Mammogram 01/15/18  IMPRESSION: 1. Suspicious 1.2 x 1.4 x 1.3 cm mixed echogenicity mass left breast 7 o'clock position retroareolar location at the site of palpable concern.. 2. Indeterminate Within the left breast 7:30 o'clock retroareolar location, adjacent to the palpable mass, is a 0.5 x 0.4 x 0.5 cm oval circumscribed hypoechoic mass. 3. Indeterminate calcifications within the lateral left breast. Location of these calcifications is not definitely confirmed on the true lateral view.    01/16/2018 Initial Biopsy   Diagnosis 01/16/18 1. Breast, left, needle core biopsy, 7:30 o'clock (ribbon clip) - FIBROCYSTIC CHANGES WITH SCLEROSING ADENOSIS AND CALCIFICATIONS. - FIBROADENOMATOID CHANGE. - NO MALIGNANCY IDENTIFIED. 2. Breast, left, needle core biopsy, 7 o'clock position (coil clip) - INVASIVE MAMMARY CARCINOMA, MSBR GRADE  I/II. - SEE MICROSCOPIC DESCRIPTION Microscopic Comment  ADDENDUM: Immunohistochemistry for E-Cadherin is strongly positive in the tumor consistent with ductal carcinoma. (JDP:ah 01/17/18)   01/16/2018 Receptors her2   Estrogen Receptor: 100%, POSITIVE, STRONG STAINING INTENSITY Progesterone Receptor: 50%, POSITIVE, STRONG STAINING INTENSITY Proliferation Marker Ki67: 20% HER2 Negative   01/16/2018 Cancer Staging   Staging form: Breast, AJCC 8th Edition - Clinical stage from 01/16/2018: Stage IA (cT1c, cN0, cM0, G2, ER+, PR+, HER2-) - Signed by Sonja Warba, MD on 01/23/2018   01/22/2018 Initial Diagnosis   Malignant neoplasm of lower-inner quadrant of left breast in female, estrogen receptor positive ([name])   02/21/2018 Surgery    LEFT BREAST LUMPECTOMY WITH AXILLARY LYMPH NODE BIOPSY by Dr. Cherlynn Cornfield  02/21/18   02/21/2018 Pathology Results   Diagnosis 02/21/18 1. Breast, lumpectomy, Left - INVASIVE DUCTAL CARCINOMA, GRADE I, 1.6 CM. - DUCTAL CARCINOMA IN SITU, INTERMEDIATE NUCLEAR GRADE. - ANTERIOR AND MEDIAL RESECTION MARGINS ARE POSITIVE FOR CARCINOMA. - NEGATIVE FOR LYMPHOVASCULAR OR PERINEURAL INVASION. - BACKGROUND BREAST TISSUE WITH FIBROCYSTIC CHANGE, INCLUDING SCLEROSING ADENOSIS. - BIOPSY SITE CHANGES. - SEE ONCOLOGY TABLE. 2. Lymph node, sentinel, biopsy, Left Axillary #1 - METASTATIC BREAST CARCINOMA TO A LYMPH NODE, 1.0 CM IN GREATEST DIMENSION, WITH EXTRANODAL EXTENSION (1/1). 3. Lymph node, sentinel, biopsy, Left Axillary #2 - LYMPH NODE, NEGATIVE FOR CARCINOMA (0/1).    02/21/2018 Miscellaneous   Mammaprint 02/21/18 Low Risk with 10-year risk of recurrnce at 10% -No potential signifcant chemotherapy benefit   03/20/2018 Pathology Results   RE-EXCISION OF BREAST LUMPECTOMY by Dr. Cherlynn Cornfield  Diagnosis 03/20/18 1. Breast, excision, Left new anterior margin - FIBROCYSTIC CHANGES WITH ADENOSIS AND CALCIFICATIONS. - HEALING BIOPSY SITE. - THERE IS NO EVIDENCE OF  MALIGNANCY. 2. Breast, excision, Left new medial margin - FIBROCYSTIC  CHANGES WITH ADENOSIS AND CALCIFICATIONS. - HEALING BIOPSY SITE. - THERE IS NO EVIDENCE OF MALIGNANCY. Microscopic Comment 1. -2. The surgical resection margin(s) of the specimen were inked and microscopically evaluated. (JBK:kh 03-22-18)   04/09/2018 Cancer Staging   Staging form: Breast, AJCC 8th Edition - Pathologic: Stage IA (pT1c, pN1, cM0, G1, ER+, PR+, HER2-) - Signed by Colie Dawes, MD on 04/09/2018   04/22/2018 - 06/03/2018 Radiation Therapy   Radaiton with Dr. Lurena Sally 04/22/18-06/03/18   05/2018 -  Anti-estrogen oral therapy   Letrozole  2.5mg  started 05/2018    Survivorship   Per Delvin File, NP    05/04/2022 Imaging    IMPRESSION: Cervical spondylosis, as described.   Nonspecific straightening of the expected cervical lordosis.   07/24/2022 Imaging    IMPRESSION: 1. Extensive osseous metastatic disease with pathologic fracture at the base of dens and C2 right lateral mass. Extraosseous tumor at C1 and C2 likely impinging on the right C2 and C3 nerve roots. 2. Degenerative cord impingement at C4-5 to C6-7. Biforaminal impingement at C5-6 and C6-7.   08/03/2022 PET scan    IMPRESSION: 1. Large volume osseous metastasis. 2. Low right cervical and probable right axillary nodal metastasis. 3. Subtle heterogeneous activity throughout the liver with suggestion of small liver lesions (likely new compared to chest CT of 07/16/2020). Findings are overall moderately suspicious for hepatic metastasis. Pre and post contrast abdominal MRI (preferred) or CT could confirm. 4. Right-sided pleural thickening and trace pleural fluid. Right base airspace disease is favored to represent chronic atelectasis. 5. Aortic atherosclerosis (ICD10-I70.0) and emphysema (ICD10-J43.9).     08/11/2022 Genetic Testing   Negative genetic testing on the CancerNext-Expanded+RNAinsight panel.  The report date is August 11, 2022.  The CancerNext-Expanded gene panel offered by [facility] and includes sequencing and rearrangement analysis for the following 77 genes: AIP, ALK, APC*, ATM*, AXIN2, BAP1, BARD1, BLM, BMPR1A, BRCA1*, BRCA2*, BRIP1*, CDC73, CDH1*, CDK4, CDKN1B, CDKN2A, CHEK2*, CTNNA1, DICER1, FANCC, FH, FLCN, GALNT12, KIF1B, LZTR1, MAX, MEN1, MET, MLH1*, MSH2*, MSH3, MSH6*, MUTYH*, NBN, NF1*, NF2, NTHL1, PALB2*, PHOX2B, PMS2*, POT1, PRKAR1A, PTCH1, PTEN*, RAD51C*, RAD51D*, RB1, RECQL, RET, SDHA, SDHAF2, SDHB, SDHC, SDHD, SMAD4, SMARCA4, SMARCB1, SMARCE1, STK11, SUFU, TMEM127, TP53*, TSC1, TSC2, VHL and XRCC2 (sequencing and deletion/duplication); EGFR, EGLN1, HOXB13, KIT, MITF, PDGFRA, POLD1, and POLE (sequencing only); EPCAM and GREM1 (deletion/duplication only). DNA and RNA analyses performed for * genes.    Lobular carcinoma in situ (LCIS) of right breast  01/20/2020 Mammogram   Diagnostic Mammogram 01/20/20 IMPRESSION: 1.  Stable post lumpectomy changes of the left breast.   2. Suspicious microcalcifications over the right upper outer quadrant spanning 3.6 cm.   02/02/2020 Initial Biopsy   Diagnosis 02/02/20 Breast, right, needle core biopsy, upper outer quadrant, x clip - LOBULAR CARCINOMA IN SITU WITH PLEOMORPHIC FEATURES AND CALCIFICATIONS, INVOLVING ADENOSIS. SEE NOTE Diagnosis Note Immunohistochemical stain for E-cadherin is negative in the lesional cells, consistent with a lobular phenotype. Immunostains for p63, SMM 1 and calponin do not show evidence of invasive carcinoma.    02/04/2020 Initial Diagnosis   Lobular carcinoma in situ (LCIS) of right breast   03/18/2020 Surgery   RIGHT BREAST LUMPECTOMY WITH RADIOACTIVE SEED LOCALIZATION by Dr Unknown Garbe    03/18/2020 Pathology Results   FINAL MICROSCOPIC DIAGNOSIS:   A. BREAST, RIGHT, LUMPECTOMY:  - Pleomorphic lobular carcinoma in situ with calcifications and  underlying complex sclerosing lesion, adenosis and fibroadenomatoid  change.  -  Margins of resection are not involved (Closest margins: <  1 mm,  anterior, posterior, inferior and medial).  - Biopsy site.    COMMENT:   P63, Calponin and SMM-1 demonstrate the presence of myoepithelium in the  select focus.    Metastatic breast cancer to bone and brain  05/04/2022 Imaging    IMPRESSION: Cervical spondylosis, as described.   Nonspecific straightening of the expected cervical lordosis.   07/24/2022 Imaging    IMPRESSION: 1. Extensive osseous metastatic disease with pathologic fracture at the base of dens and C2 right lateral mass. Extraosseous tumor at C1 and C2 likely impinging on the right C2 and C3 nerve roots. 2. Degenerative cord impingement at C4-5 to C6-7. Biforaminal impingement at C5-6 and C6-7.   08/02/2022 Initial Diagnosis   Cancer, metastatic to bone ([name])   08/03/2022 PET scan    IMPRESSION: 1. Large volume osseous metastasis. 2. Low right cervical and probable right axillary nodal metastasis. 3. Subtle heterogeneous activity throughout the liver with suggestion of small liver lesions (likely new compared to chest CT of 07/16/2020). Findings are overall moderately suspicious for hepatic metastasis. Pre and post contrast abdominal MRI (preferred) or CT could confirm. 4. Right-sided pleural thickening and trace pleural fluid. Right base airspace disease is favored to represent chronic atelectasis. 5. Aortic atherosclerosis (ICD10-I70.0) and emphysema (ICD10-J43.9).     10/26/2022 - 04/12/2023 Chemotherapy   Patient is on Treatment Plan : BREAST Paclitaxel  D1,8,15 + Trastuzumab  D1 + Pertuzumab  D1 q21d x 8 cycles / Trastuzumab  D1 + Pertuzumab  D1 q21d x 4 cycles     05/03/2023 -  Chemotherapy   Patient is on Treatment Plan : BREAST MAINTENANCE Trastuzumab  IV (6) or SQ (600) D1 q21d X 11 Cycles        Discussed the use of AI scribe software for clinical note transcription with the patient, who gave verbal consent to proceed.  History of Present  Illness The patient, a 63 year old female with metastatic breast cancer, presents for a routine follow-up. She reports feeling stronger and doing more activities. She denies any pain, only taking Tylenol  occasionally for muscle discomfort due to overexertion. She has discontinued narcotics and reports an improved appetite, consuming more portions and trying to gain weight. She has been supplementing her diet with high-calorie nutritional drinks. She reports difficulty swallowing bread, but otherwise, her diet is unrestricted.  The patient's last scan, a PET scan, showed no activity, indicating well-controlled cancer. She also reports controlled diarrhea with Lomedia, taken four times a day. She is on a regimen of Tequinsa, taken two tablets twice a day, and chemotherapy, taken two weeks on and two weeks off. She reports no issues with her hands and improving neuropathy, which she attributes to a previous neck surgery. She also reports a stable weight and no dental issues.     All other systems were reviewed with the patient and are negative.  MEDICAL HISTORY:  Past Medical History:  Diagnosis Date   Anxiety    Cancer ([name]) 2022   right breast LCIS   Cancer ([name]) 2019   left breast   Depression    Dysrhythmia    SVT, s/p ablation ~ 2012 in [facility]   Ectopic pregnancy    Family history of breast cancer    Family history of melanoma    History of radiation therapy 04/22/18-06/03/18   Left Breast, left SCV, axilla 50 Gy in 25 fractions, Left breast boost 10 Gy in 5 fractions.    Hypothyroidism    Personal history of radiation therapy  PONV (postoperative nausea and vomiting)    Thyroid  disease     SURGICAL HISTORY: Past Surgical History:  Procedure Laterality Date   APPENDECTOMY     BILATERAL SALPINGECTOMY     BREAST BIOPSY Right 2014   fibroadenoma   BREAST BIOPSY Left 01/16/2018   BREAST BIOPSY Right 02/02/2020   LCIS   BREAST BIOPSY Right 08/04/2020   x2  LCIS   BREAST BIOPSY Right 08/13/2020   x2   BREAST EXCISIONAL BIOPSY Left 1990   benign   BREAST EXCISIONAL BIOPSY Right 02/02/2020   LCIS   BREAST LUMPECTOMY Left 01/22/2018   BREAST LUMPECTOMY WITH AXILLARY LYMPH NODE BIOPSY Left 02/21/2018   Procedure: LEFT BREAST LUMPECTOMY WITH AXILLARY LYMPH NODE BIOPSY;  Surgeon: Lockie Rima, MD;  Location: [facility] OR;  Service: General;  Laterality: Left;   BREAST LUMPECTOMY WITH RADIOACTIVE SEED LOCALIZATION Right 03/18/2020   Procedure: RIGHT BREAST LUMPECTOMY WITH RADIOACTIVE SEED LOCALIZATION;  Surgeon: Lockie Rima, MD;  Location: [facility] OR;  Service: General;  Laterality: Right;   BREAST SURGERY Left 1993   cyst removed   ENDOMETRIAL ABLATION     EYE SURGERY     GLAUCOMA SURGERY Bilateral    IR IMAGING GUIDED PORT INSERTION  10/16/2022   POSTERIOR CERVICAL FUSION/FORAMINOTOMY N/A 08/17/2022   Procedure: Cervical One-Cervical Four POSTERIOR CERVICAL FUSION,  Cervical One LAMINECTOMY REDUCTION OF Cervical Two Fracture, Biopsy of Right Cerical Two Pars  Lesion;  Surgeon: Van Gelinas, MD;  Location: [facility];  Service: Neurosurgery;  Laterality: N/A;   POSTERIOR CERVICAL FUSION/FORAMINOTOMY N/A 09/06/2022   Procedure: Posterior Cervical Fusion, Foraminotomy , Cervical Five-Six, Cervical Six-Seven; Extension of  fusion Cervical Four- Thoracic One;  Surgeon: Van Gelinas, MD;  Location: [facility] OR;  Service: Neurosurgery;  Laterality: N/A;   RADIOACTIVE SEED GUIDED EXCISIONAL BREAST BIOPSY Right 04/28/2021   Procedure: RADIOACTIVE SEED GUIDED EXCISIONAL RIGHT BREAST BIOPSY X2;  Surgeon: Lockie Rima, MD;  Location: [facility];  Service: General;  Laterality: Right;   RE-EXCISION OF BREAST LUMPECTOMY Left 03/20/2018   Procedure: RE-EXCISION OF BREAST LUMPECTOMY;  Surgeon: Lockie Rima, MD;  Location:  SURGERY CENTER;  Service: General;  Laterality: Left;    I have reviewed the social history and family history with the  patient and they are unchanged from previous note.  ALLERGIES:  is allergic to morphine and codeine.  MEDICATIONS:  Current Outpatient Medications  Medication Sig Dispense Refill   acetaminophen  (TYLENOL ) 500 MG tablet Take 2 tablets (1,000 mg total) by mouth every 8 (eight) hours. 30 tablet 0   capecitabine  (XELODA ) 500 MG tablet TAKE 2 TABLETS TWICE DAILY FOR 7 DAYS ON THEN 7 DAYS OFF. TAKE AFTER MEALS 56 tablet 2   citalopram  (CELEXA ) 40 MG tablet TAKE 1 TABLET BY MOUTH EVERY DAY 90 tablet 2   diphenoxylate -atropine  (LOMOTIL ) 2.5-0.025 MG tablet Take 1 tablet by mouth 4 (four) times daily as needed for diarrhea or loose stools. 90 tablet 3   levothyroxine  (SYNTHROID ) 75 MCG tablet Take 1 tablet (75 mcg total) by mouth daily before breakfast. 90 tablet 3   lidocaine  (XYLOCAINE ) 2 % solution Patient: Mix 1part 2% viscous lidocaine , 1part H20. Swallow 10mL of diluted mixture, before meals and at bedtime, up to QID 100 mL 3   loperamide  (IMODIUM  A-D) 2 MG tablet Take 1 tablet (2 mg total) by mouth 4 (four) times daily as needed for diarrhea or loose stools. 90 tablet 0   LORazepam  (ATIVAN ) 0.5 MG tablet Take  1 tablet (0.5 mg total) by mouth every 6 (six) hours as needed for anxiety (agitation). 30 tablet 0   Mouthwashes (MOUTH RINSE) LIQD solution 15 mLs by Mouth Rinse route as needed (for oral care).  0   nicotine  (NICODERM CQ  - DOSED IN MG/24 HR) 7 mg/24hr patch Place 1 patch (7 mg total) onto the skin daily. 28 patch 0   ondansetron  (ZOFRAN ) 8 MG tablet Take 1 tablet (8 mg total) by mouth every 8 (eight) hours as needed for nausea or vomiting. 20 tablet 1   OVER THE COUNTER MEDICATION Take 1 tablet by mouth daily. Protandim Supplement     pregabalin  (LYRICA ) 25 MG capsule Take 1 capsule (25 mg total) by mouth 2 (two) times daily. 60 capsule 2   senna-docusate (SENOKOT-S) 8.6-50 MG tablet Take 1 tablet by mouth 2 (two) times daily. 60 tablet 0   tucatinib  (TUKYSA ) 150 MG tablet Take 2  tablets (300 mg total) by mouth 2 (two) times daily. Take as instructed per MD. 120 tablet 2   No current facility-administered medications for this visit.   Facility-Administered Medications Ordered in Other Visits  Medication Dose Route Frequency Provider Last Rate Last Admin   0.9 %  sodium chloride  infusion   Intravenous Once Sonja Winchester, MD        PHYSICAL EXAMINATION: ECOG PERFORMANCE STATUS: 2 - Symptomatic, <50% confined to bed  Vitals:   12/20/23 1036  BP: 118/78  Pulse: 79  Resp: 20  Temp: 98.9 F (37.2 C)  SpO2: 97%   Wt Readings from Last 3 Encounters:  12/20/23 74 lb 14.4 oz (34 kg)  11/29/23 75 lb (34 kg)  11/08/23 73 lb 8 oz (33.3 kg)     GENERAL:alert, no distress and comfortable SKIN: skin color, texture, turgor are normal, no rashes or significant lesions EYES: normal, Conjunctiva are pink and non-injected, sclera clear NECK: supple, thyroid  normal size, non-tender, without nodularity LYMPH:  no palpable lymphadenopathy in the cervical, axillary  LUNGS: clear to auscultation and percussion with normal breathing effort HEART: regular rate & rhythm and no murmurs and no lower extremity edema ABDOMEN:abdomen soft, non-tender and normal bowel sounds Musculoskeletal:no cyanosis of digits and no clubbing  NEURO: alert & oriented x 3 with fluent speech, no focal motor/sensory deficits  Physical Exam    LABORATORY DATA:  I have reviewed the data as listed    Latest Ref Rng & Units 12/20/2023   10:14 AM 11/29/2023    9:44 AM 11/08/2023   11:11 AM  CBC  WBC 4.0 - 10.5 K/uL 5.6  6.8  6.0   Hemoglobin 12.0 - 15.0 g/dL 09.8  11.9  14.7   Hematocrit 36.0 - 46.0 % 41.8  41.2  43.7   Platelets 150 - 400 K/uL 212  180  202         Latest Ref Rng & Units 12/20/2023   10:14 AM 11/29/2023    9:44 AM 11/08/2023   11:11 AM  CMP  Glucose 70 - 99 mg/dL 92  92  829   BUN 8 - 23 mg/dL 8  9  9    Creatinine 0.44 - 1.00 mg/dL 5.62  1.30  8.65   Sodium 135 - 145 mmol/L 137   138  137   Potassium 3.5 - 5.1 mmol/L 3.9  4.1  3.6   Chloride 98 - 111 mmol/L 103  102  102   CO2 22 - 32 mmol/L 33  32  31   Calcium 8.9 -  10.3 mg/dL 9.3  9.1  9.0   Total Protein 6.5 - 8.1 g/dL 6.7  6.6  6.8   Total Bilirubin 0.0 - 1.2 mg/dL 0.6  0.8  0.8   Alkaline Phos 38 - 126 U/L 94  81  82   AST 15 - 41 U/L 18  16  15    ALT 0 - 44 U/L 11  10  11        RADIOGRAPHIC STUDIES: I have personally reviewed the radiological images as listed and agreed with the findings in the report. ECHOCARDIOGRAM COMPLETE Result Date: 12/19/2023    ECHOCARDIOGRAM REPORT   Patient Name:   MARGRETE DELUDE [facility] Date of Exam: 12/19/2023 Medical Rec #:  [id number]             Height:       62.0 in Accession #:    [id number]            Weight:       75.0 lb Date of Birth:  09-17-1960             BSA:          1.260 m Patient Age:    62 years              BP:           116/66 mmHg Patient Gender: F                     HR:           82 bpm. Exam Location:  Outpatient Procedure: 2D Echo, Cardiac Doppler, Color Doppler and Strain Analysis (Both            Spectral and Color Flow Doppler were utilized during procedure). Indications:    Z51.11 Encounter for antineoplastic chemotheraphy  History:        Patient has prior history of Echocardiogram examinations, most                 recent 08/09/2023. Signs/Symptoms:Altered Mental Status; Risk                 Factors:Current Smoker. Breast cancer. Metastatic brain cancer.  Sonographer:    Raynelle Callow RDCS Referring Phys: [id number] [facility]  Sonographer Comments: Technically difficult study due to poor echo windows. Difficult study due to extreemly thin habitus. IMPRESSIONS  1. Left ventricular ejection fraction, by estimation, is 60 to 65%. The left ventricle has normal function. The left ventricle has no regional wall motion abnormalities. Left ventricular diastolic parameters are consistent with Grade I diastolic dysfunction (impaired relaxation). The average left ventricular  global longitudinal strain is -20.0 %. The global longitudinal strain is normal.  2. Right ventricular systolic function is normal. The right ventricular size is normal. There is normal pulmonary artery systolic pressure.  3. The mitral valve is normal in structure. Trivial mitral valve regurgitation. No evidence of mitral stenosis.  4. The aortic valve is normal in structure. There is mild calcification of the aortic valve. Aortic valve regurgitation is not visualized. No aortic stenosis is present.  5. The inferior vena cava is normal in size with greater than 50% respiratory variability, suggesting right atrial pressure of 3 mmHg. FINDINGS  Left Ventricle: Left ventricular ejection fraction, by estimation, is 60 to 65%. The left ventricle has normal function. The left ventricle has no regional wall motion abnormalities. The average left ventricular global longitudinal strain is -20.0 %. Strain was performed and the global longitudinal strain  is normal. The left ventricular internal cavity size was normal in size. There is no left ventricular hypertrophy. Left ventricular diastolic parameters are consistent with Grade I diastolic dysfunction (impaired relaxation). Right Ventricle: The right ventricular size is normal. No increase in right ventricular wall thickness. Right ventricular systolic function is normal. There is normal pulmonary artery systolic pressure. The tricuspid regurgitant velocity is 2.44 m/s, and  with an assumed right atrial pressure of 3 mmHg, the estimated right ventricular systolic pressure is 26.8 mmHg. Left Atrium: Left atrial size was normal in size. Right Atrium: Right atrial size was normal in size. Pericardium: There is no evidence of pericardial effusion. Mitral Valve: The mitral valve is normal in structure. Trivial mitral valve regurgitation. No evidence of mitral valve stenosis. Tricuspid Valve: The tricuspid valve is normal in structure. Tricuspid valve regurgitation is trivial. No  evidence of tricuspid stenosis. Aortic Valve: The aortic valve is normal in structure. There is mild calcification of the aortic valve. Aortic valve regurgitation is not visualized. No aortic stenosis is present. Pulmonic Valve: The pulmonic valve was grossly normal. Pulmonic valve regurgitation is not visualized. No evidence of pulmonic stenosis. Aorta: The aortic root is normal in size and structure. Venous: The inferior vena cava is normal in size with greater than 50% respiratory variability, suggesting right atrial pressure of 3 mmHg. IAS/Shunts: No atrial level shunt detected by color flow Doppler.  LEFT VENTRICLE PLAX 2D LVIDd:         3.20 cm     Diastology LVIDs:         2.10 cm     LV e' medial:    4.35 cm/s LV PW:         0.80 cm     LV E/e' medial:  14.0 LV IVS:        1.00 cm     LV e' lateral:   10.60 cm/s LVOT diam:     2.00 cm     LV E/e' lateral: 5.7 LV SV:         45 LV SV Index:   35          2D Longitudinal Strain LVOT Area:     3.14 cm    2D Strain GLS Avg:     -20.0 %  LV Volumes (MOD) LV vol d, MOD A2C: 21.6 ml LV vol d, MOD A4C: 43.6 ml LV vol s, MOD A2C: 7.8 ml LV vol s, MOD A4C: 14.7 ml LV SV MOD A2C:     13.8 ml LV SV MOD A4C:     43.6 ml LV SV MOD BP:      22.9 ml RIGHT VENTRICLE            IVC RV S prime:     7.07 cm/s  IVC diam: 1.40 cm TAPSE (M-mode): 0.8 cm LEFT ATRIUM           Index        RIGHT ATRIUM          Index LA diam:      2.60 cm 2.06 cm/m   RA Area:     3.48 cm LA Vol (A2C): 3.8 ml  2.99 ml/m   RA Volume:   4.05 ml  3.21 ml/m LA Vol (A4C): 18.7 ml 14.84 ml/m  AORTIC VALVE LVOT Vmax:   69.80 cm/s LVOT Vmean:  47.700 cm/s LVOT VTI:    0.142 m  AORTA Ao Root diam: 2.90 cm Ao Asc diam:  3.20  cm MITRAL VALVE               TRICUSPID VALVE MV Area (PHT): 2.62 cm    TR Peak grad:   23.8 mmHg MV Decel Time: 289 msec    TR Vmax:        244.00 cm/s MV E velocity: 60.80 cm/s MV A velocity: 64.70 cm/s  SHUNTS MV E/A ratio:  0.94        Systemic VTI:  0.14 m                             Systemic Diam: 2.00 cm Jules Oar MD Electronically signed by Jules Oar MD Signature Date/Time: 12/19/2023/12:03:37 PM    Final       No orders of the defined types were placed in this encounter.  All questions were answered. The patient knows to call the clinic with any problems, questions or concerns. No barriers to learning was detected. The total time spent in the appointment was 25 minutes.     Sonja Modale, MD 12/20/2023

## 2023-12-20 NOTE — Patient Instructions (Signed)
 [facility] - A DEPT OF [facility]  Discharge Instructions: Thank you for choosing [facility] to provide your oncology and hematology care.   If you have a lab appointment with the Cancer Center, please go directly to the Cancer Center and check in at the registration area.   Wear comfortable clothing and clothing appropriate for easy access to any Portacath or PICC line.   We strive to give you quality time with your provider. You may need to reschedule your appointment if you arrive late (15 or more minutes).  Arriving late affects you and other patients whose appointments are after yours.  Also, if you miss three or more appointments without notifying the office, you may be dismissed from the clinic at the provider's discretion.      For prescription refill requests, have your pharmacy contact our office and allow 72 hours for refills to be completed.    Today you received the following chemotherapy and/or immunotherapy agents: Trastuzumab       To help prevent nausea and vomiting after your treatment, we encourage you to take your nausea medication as directed.  BELOW ARE SYMPTOMS THAT SHOULD BE REPORTED IMMEDIATELY: *FEVER GREATER THAN 100.4 F (38 C) OR HIGHER *CHILLS OR SWEATING *NAUSEA AND VOMITING THAT IS NOT CONTROLLED WITH YOUR NAUSEA MEDICATION *UNUSUAL SHORTNESS OF BREATH *UNUSUAL BRUISING OR BLEEDING *URINARY PROBLEMS (pain or burning when urinating, or frequent urination) *BOWEL PROBLEMS (unusual diarrhea, constipation, pain near the anus) TENDERNESS IN MOUTH AND THROAT WITH OR WITHOUT PRESENCE OF ULCERS (sore throat, sores in mouth, or a toothache) UNUSUAL RASH, SWELLING OR PAIN  UNUSUAL VAGINAL DISCHARGE OR ITCHING   Items with * indicate a potential emergency and should be followed up as soon as possible or go to the Emergency Department if any problems should occur.  Please show the CHEMOTHERAPY ALERT CARD or  IMMUNOTHERAPY ALERT CARD at check-in to the Emergency Department and triage nurse.  Should you have questions after your visit or need to cancel or reschedule your appointment, please contact [facility] - A DEPT [facility]  Dept: [phone]  and follow the prompts.  Office hours are 8:00 a.m. to 4:30 p.m. Monday - Friday. Please note that voicemails left after 4:00 p.m. may not be returned until the following business day.  We are closed weekends and major holidays. You have access to a nurse at all times for urgent questions. Please call the main number to the clinic Dept: [phone] and follow the prompts.   For any non-urgent questions, you may also contact your provider using MyChart. We now offer e-Visits for anyone 46 and older to request care online for non-urgent symptoms. For details visit [email or web address].   Also download the MyChart app! Go to the app store, search "MyChart", open the app, select [place], and log in with your MyChart username and password.  Zoledronic  Acid Injection (Cancer) What is this medication? ZOLEDRONIC  ACID (ZOE le dron ik AS id) treats high calcium levels in the blood caused by cancer. It may also be used with chemotherapy to treat weakened bones caused by cancer. It works by slowing down the release of calcium from bones. This lowers calcium levels in your blood. It also makes your bones stronger and less likely to break (fracture). It belongs to a group of medications called bisphosphonates. This medicine may be used for other purposes; ask your health care provider  or pharmacist if you have questions. COMMON BRAND NAME(S): Zometa , Zometa  Powder What should I tell my care team before I take this medication? They need to know if you have any of these conditions: Dehydration Dental disease Kidney disease Liver disease Low levels of calcium in the blood Lung or breathing disease, such as asthma Receiving  steroids, such as dexamethasone  or prednisone  An unusual or allergic reaction to zoledronic  acid, other medications, foods, dyes, or preservatives Pregnant or trying to get pregnant Breast-feeding How should I use this medication? This medication is injected into a vein. It is given by your care team in a hospital or clinic setting. Talk to your care team about the use of this medication in children. Special care may be needed. Overdosage: If you think you have taken too much of this medicine contact a poison control center or emergency room at once. NOTE: This medicine is only for you. Do not share this medicine with others. What if I miss a dose? Keep appointments for follow-up doses. It is important not to miss your dose. Call your care team if you are unable to keep an appointment. What may interact with this medication? Certain antibiotics given by injection Diuretics, such as bumetanide, furosemide NSAIDs, medications for pain and inflammation, such as ibuprofen or naproxen Teriparatide Thalidomide This list may not describe all possible interactions. Give your health care provider a list of all the medicines, herbs, non-prescription drugs, or dietary supplements you use. Also tell them if you smoke, drink alcohol, or use illegal drugs. Some items may interact with your medicine. What should I watch for while using this medication? Visit your care team for regular checks on your progress. It may be some time before you see the benefit from this medication. Some people who take this medication have severe bone, joint, or muscle pain. This medication may also increase your risk for jaw problems or a broken thigh bone. Tell your care team right away if you have severe pain in your jaw, bones, joints, or muscles. Tell you care team if you have any pain that does not go away or that gets worse. Tell your dentist and dental surgeon that you are taking this medication. You should not have major  dental surgery while on this medication. See your dentist to have a dental exam and fix any dental problems before starting this medication. Take good care of your teeth while on this medication. Make sure you see your dentist for regular follow-up appointments. You should make sure you get enough calcium and vitamin D while you are taking this medication. Discuss the foods you eat and the vitamins you take with your care team. Check with your care team if you have severe diarrhea, nausea, and vomiting, or if you sweat a lot. The loss of too much body fluid may make it dangerous for you to take this medication. You may need bloodwork while taking this medication. Talk to your care team if you wish to become pregnant or think you might be pregnant. This medication can cause serious birth defects. What side effects may I notice from receiving this medication? Side effects that you should report to your care team as soon as possible: Allergic reactions--skin rash, itching, hives, swelling of the face, lips, tongue, or throat Kidney injury--decrease in the amount of urine, swelling of the ankles, hands, or feet Low calcium level--muscle pain or cramps, confusion, tingling, or numbness in the hands or feet Osteonecrosis of the jaw--pain, swelling, or redness  in the mouth, numbness of the jaw, poor healing after dental work, unusual discharge from the mouth, visible bones in the mouth Severe bone, joint, or muscle pain Side effects that usually do not require medical attention (report to your care team if they continue or are bothersome): Constipation Fatigue Fever Loss of appetite Nausea Stomach pain This list may not describe all possible side effects. Call your doctor for medical advice about side effects. You may report side effects to FDA at 1-800-FDA-1088. Where should I keep my medication? This medication is given in a hospital or clinic. It will not be stored at home. NOTE: This sheet is a  summary. It may not cover all possible information. If you have questions about this medicine, talk to your doctor, pharmacist, or health care provider.  2024 [employer]vier/Gold Standard (2021-10-07 00:00:00)

## 2023-12-27 ENCOUNTER — Ambulatory Visit
Admission: RE | Admit: 2023-12-27 | Discharge: 2023-12-27 | Disposition: A | Discharge status: Home / Self Care | Payer: BC Managed Care – PPO | Source: Ambulatory Visit | Attending: Internal Medicine | Admitting: Internal Medicine

## 2023-12-27 DIAGNOSIS — C7931 Secondary malignant neoplasm of brain: Secondary | ICD-10-CM | POA: Diagnosis not present

## 2023-12-27 DIAGNOSIS — Z853 Personal history of malignant neoplasm of breast: Secondary | ICD-10-CM | POA: Diagnosis not present

## 2023-12-27 MED ORDER — GADOPICLENOL 0.5 MMOL/ML IV SOLN
4.0000 mL | Freq: Once | INTRAVENOUS | Status: AC | PRN
Start: 2023-12-27 — End: 2023-12-27
  Administered 2023-12-27: 4 mL via INTRAVENOUS

## 2023-12-31 ENCOUNTER — Inpatient Hospital Stay: Payer: BC Managed Care – PPO

## 2024-01-01 ENCOUNTER — Ambulatory Visit
Admission: RE | Admit: 2024-01-01 | Discharge: 2024-01-01 | Disposition: A | Discharge status: Home / Self Care | Source: Ambulatory Visit | Attending: Radiation Oncology | Admitting: Radiation Oncology

## 2024-01-01 ENCOUNTER — Inpatient Hospital Stay: Payer: Self-pay | Admitting: Nurse Practitioner

## 2024-01-01 ENCOUNTER — Ambulatory Visit

## 2024-01-01 ENCOUNTER — Inpatient Hospital Stay: Payer: BC Managed Care – PPO | Attending: Genetic Counselor | Admitting: Internal Medicine

## 2024-01-01 ENCOUNTER — Encounter: Payer: Self-pay | Admitting: Radiation Oncology

## 2024-01-01 VITALS — BP 119/63 | HR 76 | Temp 97.4°F | Resp 18 | Ht 62.0 in | Wt 74.2 lb

## 2024-01-01 DIAGNOSIS — C50312 Malignant neoplasm of lower-inner quadrant of left female breast: Secondary | ICD-10-CM | POA: Insufficient documentation

## 2024-01-01 DIAGNOSIS — Z170 Estrogen receptor positive status [ER+]: Secondary | ICD-10-CM | POA: Diagnosis not present

## 2024-01-01 DIAGNOSIS — C7931 Secondary malignant neoplasm of brain: Secondary | ICD-10-CM | POA: Diagnosis not present

## 2024-01-01 DIAGNOSIS — C7951 Secondary malignant neoplasm of bone: Secondary | ICD-10-CM | POA: Diagnosis not present

## 2024-01-01 DIAGNOSIS — Z79899 Other long term (current) drug therapy: Secondary | ICD-10-CM | POA: Insufficient documentation

## 2024-01-01 DIAGNOSIS — C787 Secondary malignant neoplasm of liver and intrahepatic bile duct: Secondary | ICD-10-CM | POA: Insufficient documentation

## 2024-01-01 DIAGNOSIS — F17210 Nicotine dependence, cigarettes, uncomplicated: Secondary | ICD-10-CM | POA: Diagnosis not present

## 2024-01-01 DIAGNOSIS — Z5112 Encounter for antineoplastic immunotherapy: Secondary | ICD-10-CM | POA: Insufficient documentation

## 2024-01-01 NOTE — Progress Notes (Signed)
 Martha [name]

## 2024-01-01 NOTE — Progress Notes (Signed)
 [facility] [address]  [place], [place] [address] [phone]   Interval Evaluation  Date of Service: 01/01/24 Patient Name: Martha [name] Patient MRN: [id number] Patient DOB: Dec 15, 1960 Provider: Mamie Searles, MD  Identifying Statement:  Martha [name] is a 63 y.o. female with Metastatic breast cancer to bone and brain    Primary Cancer:  Oncologic History: Oncology History Overview Note  Cancer Staging Malignant neoplasm of lower-inner quadrant of left breast in female, estrogen receptor positive ([name]) Staging form: Breast, AJCC 8th Edition - Clinical stage from 01/16/2018: Stage IA (cT1c, cN0, cM0, G2, ER+, PR+, HER2-) - Signed by Sonja [place], MD on 01/23/2018 - Pathologic: Stage IA (pT1c, pN1, cM0, G1, ER+, PR+, HER2-) - Signed by Colie Dawes, MD on 04/09/2018     Malignant neoplasm of lower-inner quadrant of left breast in female, estrogen receptor positive ([name])  01/15/2018 Mammogram   Diagnositc Mammogram 01/15/18  IMPRESSION: 1. Suspicious 1.2 x 1.4 x 1.3 cm mixed echogenicity mass left breast 7 o'clock position retroareolar location at the site of palpable concern.. 2. Indeterminate Within the left breast 7:30 o'clock retroareolar location, adjacent to the palpable mass, is a 0.5 x 0.4 x 0.5 cm oval circumscribed hypoechoic mass. 3. Indeterminate calcifications within the lateral left breast. Location of these calcifications is not definitely confirmed on the true lateral view.    01/16/2018 Initial Biopsy   Diagnosis 01/16/18 1. Breast, left, needle core biopsy, 7:30 o'clock (ribbon clip) - FIBROCYSTIC CHANGES WITH SCLEROSING ADENOSIS AND CALCIFICATIONS. - FIBROADENOMATOID CHANGE. - NO MALIGNANCY IDENTIFIED. 2. Breast, left, needle core biopsy, 7 o'clock position (coil clip) - INVASIVE MAMMARY CARCINOMA, MSBR GRADE I/II. - SEE MICROSCOPIC DESCRIPTION Microscopic Comment  ADDENDUM:  Immunohistochemistry for E-Cadherin is strongly positive in the tumor consistent with ductal carcinoma. (JDP:ah 01/17/18)   01/16/2018 Receptors her2   Estrogen Receptor: 100%, POSITIVE, STRONG STAINING INTENSITY Progesterone Receptor: 50%, POSITIVE, STRONG STAINING INTENSITY Proliferation Marker Ki67: 20% HER2 Negative   01/16/2018 Cancer Staging   Staging form: Breast, AJCC 8th Edition - Clinical stage from 01/16/2018: Stage IA (cT1c, cN0, cM0, G2, ER+, PR+, HER2-) - Signed by Sonja [place], MD on 01/23/2018   01/22/2018 Initial Diagnosis   Malignant neoplasm of lower-inner quadrant of left breast in female, estrogen receptor positive ([name])   02/21/2018 Surgery    LEFT BREAST LUMPECTOMY WITH AXILLARY LYMPH NODE BIOPSY by Dr. Cherlynn Cornfield  02/21/18   02/21/2018 Pathology Results   Diagnosis 02/21/18 1. Breast, lumpectomy, Left - INVASIVE DUCTAL CARCINOMA, GRADE I, 1.6 CM. - DUCTAL CARCINOMA IN SITU, INTERMEDIATE NUCLEAR GRADE. - ANTERIOR AND MEDIAL RESECTION MARGINS ARE POSITIVE FOR CARCINOMA. - NEGATIVE FOR LYMPHOVASCULAR OR PERINEURAL INVASION. - BACKGROUND BREAST TISSUE WITH FIBROCYSTIC CHANGE, INCLUDING SCLEROSING ADENOSIS. - BIOPSY SITE CHANGES. - SEE ONCOLOGY TABLE. 2. Lymph node, sentinel, biopsy, Left Axillary #1 - METASTATIC BREAST CARCINOMA TO A LYMPH NODE, 1.0 CM IN GREATEST DIMENSION, WITH EXTRANODAL EXTENSION (1/1). 3. Lymph node, sentinel, biopsy, Left Axillary #2 - LYMPH NODE, NEGATIVE FOR CARCINOMA (0/1).    02/21/2018 Miscellaneous   Mammaprint 02/21/18 Low Risk with 10-year risk of recurrnce at 10% -No potential signifcant chemotherapy benefit   03/20/2018 Pathology Results   RE-EXCISION OF BREAST LUMPECTOMY by Dr. Cherlynn Cornfield  Diagnosis 03/20/18 1. Breast, excision, Left new anterior margin - FIBROCYSTIC CHANGES WITH ADENOSIS AND CALCIFICATIONS. - HEALING BIOPSY SITE. - THERE IS NO EVIDENCE OF MALIGNANCY. 2. Breast, excision, Left new medial margin - FIBROCYSTIC CHANGES WITH  ADENOSIS AND CALCIFICATIONS. - HEALING BIOPSY SITE. - THERE IS NO EVIDENCE OF MALIGNANCY. Microscopic Comment 1. -2. The surgical resection margin(s) of the specimen were inked and microscopically evaluated. (JBK:kh 03-22-18)   04/09/2018 Cancer Staging   Staging form: Breast, AJCC 8th Edition - Pathologic: Stage IA (pT1c, pN1, cM0, G1, ER+, PR+, HER2-) - Signed by Colie Dawes, MD on 04/09/2018   04/22/2018 - 06/03/2018 Radiation Therapy   Radaiton with Dr. Lurena Sally 04/22/18-06/03/18   05/2018 -  Anti-estrogen oral therapy   Letrozole  2.5mg  started 05/2018    Survivorship   Per Delvin File, NP    05/04/2022 Imaging    IMPRESSION: Cervical spondylosis, as described.   Nonspecific straightening of the expected cervical lordosis.   07/24/2022 Imaging    IMPRESSION: 1. Extensive osseous metastatic disease with pathologic fracture at the base of dens and C2 right lateral mass. Extraosseous tumor at C1 and C2 likely impinging on the right C2 and C3 nerve roots. 2. Degenerative cord impingement at C4-5 to C6-7. Biforaminal impingement at C5-6 and C6-7.   08/03/2022 PET scan    IMPRESSION: 1. Large volume osseous metastasis. 2. Low right cervical and probable right axillary nodal metastasis. 3. Subtle heterogeneous activity throughout the liver with suggestion of small liver lesions (likely new compared to chest CT of 07/16/2020). Findings are overall moderately suspicious for hepatic metastasis. Pre and post contrast abdominal MRI (preferred) or CT could confirm. 4. Right-sided pleural thickening and trace pleural fluid. Right base airspace disease is favored to represent chronic atelectasis. 5. Aortic atherosclerosis (ICD10-I70.0) and emphysema (ICD10-J43.9).     08/11/2022 Genetic Testing   Negative genetic testing on the CancerNext-Expanded+RNAinsight panel.  The report date is August 11, 2022.  The CancerNext-Expanded gene panel offered by [facility] and includes  sequencing and rearrangement analysis for the following 77 genes: AIP, ALK, APC*, ATM*, AXIN2, BAP1, BARD1, BLM, BMPR1A, BRCA1*, BRCA2*, BRIP1*, CDC73, CDH1*, CDK4, CDKN1B, CDKN2A, CHEK2*, CTNNA1, DICER1, FANCC, FH, FLCN, GALNT12, KIF1B, LZTR1, MAX, MEN1, MET, MLH1*, MSH2*, MSH3, MSH6*, MUTYH*, NBN, NF1*, NF2, NTHL1, PALB2*, PHOX2B, PMS2*, POT1, PRKAR1A, PTCH1, PTEN*, RAD51C*, RAD51D*, RB1, RECQL, RET, SDHA, SDHAF2, SDHB, SDHC, SDHD, SMAD4, SMARCA4, SMARCB1, SMARCE1, STK11, SUFU, TMEM127, TP53*, TSC1, TSC2, VHL and XRCC2 (sequencing and deletion/duplication); EGFR, EGLN1, HOXB13, KIT, MITF, PDGFRA, POLD1, and POLE (sequencing only); EPCAM and GREM1 (deletion/duplication only). DNA and RNA analyses performed for * genes.    Lobular carcinoma in situ (LCIS) of right breast  01/20/2020 Mammogram   Diagnostic Mammogram 01/20/20 IMPRESSION: 1.  Stable post lumpectomy changes of the left breast.   2. Suspicious microcalcifications over the right upper outer quadrant spanning 3.6 cm.   02/02/2020 Initial Biopsy   Diagnosis 02/02/20 Breast, right, needle core biopsy, upper outer quadrant, x clip - LOBULAR CARCINOMA IN SITU WITH PLEOMORPHIC FEATURES AND CALCIFICATIONS, INVOLVING ADENOSIS. SEE NOTE Diagnosis Note Immunohistochemical stain for E-cadherin is negative in the lesional cells, consistent with a lobular phenotype. Immunostains for p63, SMM 1 and calponin do not show evidence of invasive carcinoma.    02/04/2020 Initial Diagnosis   Lobular carcinoma in situ (LCIS) of right breast   03/18/2020 Surgery   RIGHT BREAST LUMPECTOMY WITH RADIOACTIVE SEED LOCALIZATION by Dr Unknown Garbe    03/18/2020 Pathology Results   FINAL MICROSCOPIC DIAGNOSIS:   A. BREAST, RIGHT, LUMPECTOMY:  - Pleomorphic lobular carcinoma in situ with calcifications and  underlying complex sclerosing lesion, adenosis and fibroadenomatoid  change.  - Margins of resection are not involved (Closest margins: < 1 mm,  anterior, posterior,  inferior and medial).  - Biopsy site.    COMMENT:   P63, Calponin and SMM-1 demonstrate the presence of myoepithelium in the  select focus.    Metastatic breast cancer to bone and brain  05/04/2022 Imaging    IMPRESSION: Cervical spondylosis, as described.   Nonspecific straightening of the expected cervical lordosis.   07/24/2022 Imaging    IMPRESSION: 1. Extensive osseous metastatic disease with pathologic fracture at the base of dens and C2 right lateral mass. Extraosseous tumor at C1 and C2 likely impinging on the right C2 and C3 nerve roots. 2. Degenerative cord impingement at C4-5 to C6-7. Biforaminal impingement at C5-6 and C6-7.   08/02/2022 Initial Diagnosis   Cancer, metastatic to bone ([name])   08/03/2022 PET scan    IMPRESSION: 1. Large volume osseous metastasis. 2. Low right cervical and probable right axillary nodal metastasis. 3. Subtle heterogeneous activity throughout the liver with suggestion of small liver lesions (likely new compared to chest CT of 07/16/2020). Findings are overall moderately suspicious for hepatic metastasis. Pre and post contrast abdominal MRI (preferred) or CT could confirm. 4. Right-sided pleural thickening and trace pleural fluid. Right base airspace disease is favored to represent chronic atelectasis. 5. Aortic atherosclerosis (ICD10-I70.0) and emphysema (ICD10-J43.9).     10/26/2022 - 04/12/2023 Chemotherapy   Patient is on Treatment Plan : BREAST Paclitaxel  D1,8,15 + Trastuzumab  D1 + Pertuzumab  D1 q21d x 8 cycles / Trastuzumab  D1 + Pertuzumab  D1 q21d x 4 cycles     05/03/2023 -  Chemotherapy   Patient is on Treatment Plan : BREAST MAINTENANCE Trastuzumab  IV (6) or SQ (600) D1 q21d X 11 Cycles      CNS Oncologic History 09/20/22: SRSx22 with Dr. Lurena Sally   Interval History: Martha [name] presents today for follow up after MRI study.  She describes improvements with overall energy, gait and balance.  She is hardly needing  the walker at all.  Doing well with treatments for breast cancer with Dr. Maryalice Smaller.  No headaches or seizures.   H+P (09/04/23) Patient presents today for follow up after recent MRI brain.  She describes no new or progressive neurologic deficits.  Gait is now independent.  Still experiences moderate pain in neck and lower back.  Continues to follow with Dr. Maryalice Smaller for breast cancer, is taking   Medications: Current Outpatient Medications on File Prior to Visit  Medication Sig Dispense Refill   acetaminophen  (TYLENOL ) 500 MG tablet Take 2 tablets (1,000 mg total) by mouth every 8 (eight) hours. 30 tablet 0   capecitabine  (XELODA ) 500 MG tablet TAKE 2 TABLETS TWICE DAILY FOR 7 DAYS ON THEN 7 DAYS OFF. TAKE AFTER MEALS 56 tablet 2   citalopram  (CELEXA ) 40 MG tablet TAKE 1 TABLET BY MOUTH EVERY DAY 90 tablet 2   diphenoxylate -atropine  (LOMOTIL ) 2.5-0.025 MG tablet Take 1 tablet by mouth 4 (four) times daily as needed for diarrhea or loose stools. 90 tablet 3   levothyroxine  (SYNTHROID ) 75 MCG tablet Take 1 tablet (75 mcg total) by mouth daily before breakfast. 90 tablet 3   Mouthwashes (MOUTH RINSE) LIQD solution 15 mLs by Mouth Rinse route as needed (for oral care).  0   nicotine  (NICODERM CQ  - DOSED IN MG/24 HR) 7 mg/24hr patch Place 1 patch (7 mg total) onto the skin daily. 28 patch 0   OVER THE COUNTER MEDICATION Take 1 tablet by mouth daily. Protandim Supplement     pregabalin  (LYRICA ) 25 MG capsule Take 1 capsule (25  mg total) by mouth 2 (two) times daily. 60 capsule 2   tucatinib  (TUKYSA ) 150 MG tablet Take 2 tablets (300 mg total) by mouth 2 (two) times daily. Take as instructed per MD. 120 tablet 2   lidocaine  (XYLOCAINE ) 2 % solution Patient: Mix 1part 2% viscous lidocaine , 1part H20. Swallow 10mL of diluted mixture, before meals and at bedtime, up to QID (Patient not taking: Reported on 01/01/2024) 100 mL 3   loperamide  (IMODIUM  A-D) 2 MG tablet Take 1 tablet (2 mg total) by mouth 4 (four) times  daily as needed for diarrhea or loose stools. 90 tablet 0   LORazepam  (ATIVAN ) 0.5 MG tablet Take 1 tablet (0.5 mg total) by mouth every 6 (six) hours as needed for anxiety (agitation). (Patient not taking: Reported on 01/01/2024) 30 tablet 0   ondansetron  (ZOFRAN ) 8 MG tablet Take 1 tablet (8 mg total) by mouth every 8 (eight) hours as needed for nausea or vomiting. (Patient not taking: Reported on 01/01/2024) 20 tablet 1   senna-docusate (SENOKOT-S) 8.6-50 MG tablet Take 1 tablet by mouth 2 (two) times daily. (Patient not taking: Reported on 01/01/2024) 60 tablet 0   No current facility-administered medications on file prior to visit.    Allergies:  Allergies  Allergen Reactions   Morphine And Codeine Other (See Comments)    "Makes her angry"   Past Medical History:  Past Medical History:  Diagnosis Date   Anxiety    Cancer ([name]) 2022   right breast LCIS   Cancer ([name]) 2019   left breast   Depression    Dysrhythmia    SVT, s/p ablation ~ 2012 in at [facility]   Ectopic pregnancy    Family history of breast cancer    Family history of melanoma    History of radiation therapy 04/22/18-06/03/18   Left Breast, left SCV, axilla 50 Gy in 25 fractions, Left breast boost 10 Gy in 5 fractions.    Hypothyroidism    Personal history of radiation therapy    PONV (postoperative nausea and vomiting)    Thyroid  disease    Past Surgical History:  Past Surgical History:  Procedure Laterality Date   APPENDECTOMY     BILATERAL SALPINGECTOMY     BREAST BIOPSY Right 2014   fibroadenoma   BREAST BIOPSY Left 01/16/2018   BREAST BIOPSY Right 02/02/2020   LCIS   BREAST BIOPSY Right 08/04/2020   x2 LCIS   BREAST BIOPSY Right 08/13/2020   x2   BREAST EXCISIONAL BIOPSY Left 1990   benign   BREAST EXCISIONAL BIOPSY Right 02/02/2020   LCIS   BREAST LUMPECTOMY Left 01/22/2018   BREAST LUMPECTOMY WITH AXILLARY LYMPH NODE BIOPSY Left 02/21/2018   Procedure: LEFT BREAST LUMPECTOMY WITH  AXILLARY LYMPH NODE BIOPSY;  Surgeon: Lockie Rima, MD;  Location: [facility] OR;  Service: General;  Laterality: Left;   BREAST LUMPECTOMY WITH RADIOACTIVE SEED LOCALIZATION Right 03/18/2020   Procedure: RIGHT BREAST LUMPECTOMY WITH RADIOACTIVE SEED LOCALIZATION;  Surgeon: Lockie Rima, MD;  Location: [facility] OR;  Service: General;  Laterality: Right;   BREAST SURGERY Left 1993   cyst removed   ENDOMETRIAL ABLATION     EYE SURGERY     GLAUCOMA SURGERY Bilateral    IR IMAGING GUIDED PORT INSERTION  10/16/2022   POSTERIOR CERVICAL FUSION/FORAMINOTOMY N/A 08/17/2022   Procedure: Cervical One-Cervical Four POSTERIOR CERVICAL FUSION,  Cervical One LAMINECTOMY REDUCTION OF Cervical Two Fracture, Biopsy of Right Cerical Two Pars  Lesion;  Surgeon: Andy Bannister,  Cleatrice Curl, MD;  Location: [facility] OR;  Service: Neurosurgery;  Laterality: N/A;   POSTERIOR CERVICAL FUSION/FORAMINOTOMY N/A 09/06/2022   Procedure: Posterior Cervical Fusion, Foraminotomy , Cervical Five-Six, Cervical Six-Seven; Extension of  fusion Cervical Four- Thoracic One;  Surgeon: Van Gelinas, MD;  Location: [facility] OR;  Service: Neurosurgery;  Laterality: N/A;   RADIOACTIVE SEED GUIDED EXCISIONAL BREAST BIOPSY Right 04/28/2021   Procedure: RADIOACTIVE SEED GUIDED EXCISIONAL RIGHT BREAST BIOPSY X2;  Surgeon: Lockie Rima, MD;  Location: [facility];  Service: General;  Laterality: Right;   RE-EXCISION OF BREAST LUMPECTOMY Left 03/20/2018   Procedure: RE-EXCISION OF BREAST LUMPECTOMY;  Surgeon: Lockie Rima, MD;  Location: [facility];  Service: General;  Laterality: Left;   Social History:  Social History   Socioeconomic History   Marital status: Married    Spouse name: Not on file   Number of children: 0   Years of education: Not on file   Highest education level: Not on file  Occupational History   Not on file  Tobacco Use   Smoking status: Every Day    Current packs/day: 0.50    Average packs/day: 0.5 packs/day  for 40.0 years (20.0 ttl pk-yrs)    Types: Cigarettes   Smokeless tobacco: Never  Vaping Use   Vaping status: Former   Quit date: 09/27/2013  Substance and Sexual Activity   Alcohol use: Not Currently    Alcohol/week: 28.0 standard drinks of alcohol    Types: 28 Cans of beer per week    Comment: drinks 4 beers daily   Drug use: No   Sexual activity: Yes    Birth control/protection: Post-menopausal    Comment: endometrial ablasion  Other Topics Concern   Not on file  Social History Narrative   1 stepdaughter   Social Drivers of Health   Financial Resource Strain: Not on file  Food Insecurity: No Food Insecurity (10/23/2022)   Hunger Vital Sign    Worried About Running Out of Food in the Last Year: Never true    Ran Out of Food in the Last Year: Never true  Transportation Needs: No Transportation Needs (10/23/2022)   PRAPARE - Transportation    Lack of Transportation (Medical): No    Lack of Transportation (Non-Medical): No  Physical Activity: Not on file  Stress: Not on file  Social Connections: Not on file  Intimate Partner Violence: Not At Risk (09/04/2022)   Humiliation, Afraid, Rape, and Kick questionnaire    Fear of Current or Ex-Partner: No    Emotionally Abused: No    Physically Abused: No    Sexually Abused: No   Family History:  Family History  Problem Relation Age of Onset   Heart disease Mother    Pneumonia Father    Hyperlipidemia Sister    Melanoma Sister 60       hx of 5 melanomas   Lung cancer Maternal Uncle        mesothelioma   Pneumonia Maternal Uncle    Dementia Paternal Grandmother    Lung disease Paternal Grandfather    Breast cancer Other        MGMs sister dx < 50   Breast cancer Other        PGFs sister dx < 50   Breast cancer Cousin        mother's mat first cousin, dx < 50    Review of Systems: Constitutional: Doesn't report fevers, chills or abnormal weight loss Eyes: Doesn't report blurriness  of vision Ears, nose, mouth, throat,  and face: Doesn't report sore throat Respiratory: Doesn't report cough, dyspnea or wheezes Cardiovascular: Doesn't report palpitation, chest discomfort  Gastrointestinal:  Doesn't report nausea, constipation, diarrhea GU: Doesn't report incontinence Skin: Doesn't report skin rashes Neurological: Per HPI Musculoskeletal: Doesn't report joint pain Behavioral/Psych: Doesn't report anxiety  Physical Exam: Vitals:   01/01/24 0925  BP: 119/63  Pulse: 76  Resp: 18  Temp: (!) 97.4 F (36.3 C)  SpO2: 98%   KPS: 90. General: Alert, cooperative, pleasant, in no acute distress Head: Normal EENT: No conjunctival injection or scleral icterus.  Lungs: Resp effort normal Cardiac: Regular rate Abdomen: Non-distended abdomen Skin: No rashes cyanosis or petechiae. Extremities: No clubbing or edema  Neurologic Exam: Mental Status: Awake, alert, attentive to examiner. Oriented to self and environment. Language is fluent with intact comprehension.  Cranial Nerves: Visual acuity is grossly normal. Visual fields are full. Extra-ocular movements intact. No ptosis. Face is symmetric Motor: Tone and bulk are normal. Power is full in both arms and legs. Reflexes are symmetric, no pathologic reflexes present.  Sensory: Intact to light touch Gait: Normal.   Labs: I have reviewed the data as listed    Component Value Date/Time   NA 137 12/20/2023 1014   K 3.9 12/20/2023 1014   CL 103 12/20/2023 1014   CO2 33 (H) 12/20/2023 1014   GLUCOSE 92 12/20/2023 1014   BUN 8 12/20/2023 1014   CREATININE 0.49 12/20/2023 1014   CALCIUM 9.3 12/20/2023 1014   PROT 6.7 12/20/2023 1014   ALBUMIN 4.4 12/20/2023 1014   AST 18 12/20/2023 1014   ALT 11 12/20/2023 1014   ALKPHOS 94 12/20/2023 1014   BILITOT 0.6 12/20/2023 1014   GFRNONAA >60 12/20/2023 1014   GFRAA >60 05/21/2020 1350   Lab Results  Component Value Date   WBC 5.6 12/20/2023   NEUTROABS 4.0 12/20/2023   HGB 14.7 12/20/2023   HCT 41.8  12/20/2023   MCV 100.0 12/20/2023   PLT 212 12/20/2023    Imaging:  MR BRAIN W WO CONTRAST Result Date: 12/28/2023 CLINICAL DATA:  Brain/CNS neoplasm, assess treatment response. History of metastatic breast cancer. EXAM: MRI HEAD WITHOUT AND WITH CONTRAST TECHNIQUE: Multiplanar, multiecho pulse sequences of the brain and surrounding structures were obtained without and with intravenous contrast. CONTRAST:  4 mL Vueway  COMPARISON:  Head MRI 08/30/2023 and 05/30/2023 FINDINGS: Brain: Innumerable punctate enhancing lesions are present throughout the cerebellum in both cerebral hemispheres and have greatly increased in number from the prior studies. Some pre-existing lesions have increased in size, for example 4 mm lesions in the right lentiform nucleus and posterosuperior left temporal lobe (series 13, images 64 and 72, previously 2-3 mm each). Extensive bilateral basal ganglia involvement is again noted with lesions being confluent in these regions. There is no significant brain edema. No acute infarct, intracranial hemorrhage, midline shift, hydrocephalus, or extra-axial fluid collection is identified. There is a background of mild chronic small vessel ischemia in the cerebral white matter. A 7 mm peripherally enhancing lesion in the left cerebellopontine angle cistern is unchanged (series 14, image 36). Vascular: Major intracranial vascular flow voids are preserved. Skull and upper cervical spine: Similar appearance of diffusely heterogeneous bone marrow signal including in the clivus compatible with known metastatic disease. Partially visualized posterior cervical spinal fusion. Sinuses/Orbits: Unremarkable orbits. Paranasal sinuses and mastoid air cells are clear. Other: None. IMPRESSION: Progressive intracranial metastatic disease with innumerable predominantly punctate enhancing lesions. No edema. Electronically Signed  By: Aundra Lee M.D.   On: 12/28/2023 13:36   ECHOCARDIOGRAM COMPLETE Result  Date: 12/19/2023    ECHOCARDIOGRAM REPORT   Patient Name:   Martha [name] [facility] Date of Exam: 12/19/2023 Medical Rec #:  [id number]             Height:       62.0 in Accession #:    [id number]            Weight:       75.0 lb Date of Birth:  [birth year]             BSA:          1.260 m Patient Age:    62 years              BP:           116/66 mmHg Patient Gender: F                     HR:           82 bpm. Exam Location:  Outpatient Procedure: 2D Echo, Cardiac Doppler, Color Doppler and Strain Analysis (Both            Spectral and Color Flow Doppler were utilized during procedure). Indications:    Z51.11 Encounter for antineoplastic chemotheraphy  History:        Patient has prior history of Echocardiogram examinations, most                 recent 08/09/2023. Signs/Symptoms:Altered Mental Status; Risk                 Factors:Current Smoker. Breast cancer. Metastatic brain cancer.  Sonographer:    Raynelle Callow RDCS Referring Phys: [id number] [facility]  Sonographer Comments: Technically difficult study due to poor echo windows. Difficult study due to extreemly thin habitus. IMPRESSIONS  1. Left ventricular ejection fraction, by estimation, is 60 to 65%. The left ventricle has normal function. The left ventricle has no regional wall motion abnormalities. Left ventricular diastolic parameters are consistent with Grade I diastolic dysfunction (impaired relaxation). The average left ventricular global longitudinal strain is -20.0 %. The global longitudinal strain is normal.  2. Right ventricular systolic function is normal. The right ventricular size is normal. There is normal pulmonary artery systolic pressure.  3. The mitral valve is normal in structure. Trivial mitral valve regurgitation. No evidence of mitral stenosis.  4. The aortic valve is normal in structure. There is mild calcification of the aortic valve. Aortic valve regurgitation is not visualized. No aortic stenosis is present.  5. The inferior vena cava is normal  in size with greater than 50% respiratory variability, suggesting right atrial pressure of 3 mmHg. FINDINGS  Left Ventricle: Left ventricular ejection fraction, by estimation, is 60 to 65%. The left ventricle has normal function. The left ventricle has no regional wall motion abnormalities. The average left ventricular global longitudinal strain is -20.0 %. Strain was performed and the global longitudinal strain is normal. The left ventricular internal cavity size was normal in size. There is no left ventricular hypertrophy. Left ventricular diastolic parameters are consistent with Grade I diastolic dysfunction (impaired relaxation). Right Ventricle: The right ventricular size is normal. No increase in right ventricular wall thickness. Right ventricular systolic function is normal. There is normal pulmonary artery systolic pressure. The tricuspid regurgitant velocity is 2.44 m/s, and  with an assumed right atrial pressure of 3 mmHg, the estimated right ventricular systolic  pressure is 26.8 mmHg. Left Atrium: Left atrial size was normal in size. Right Atrium: Right atrial size was normal in size. Pericardium: There is no evidence of pericardial effusion. Mitral Valve: The mitral valve is normal in structure. Trivial mitral valve regurgitation. No evidence of mitral valve stenosis. Tricuspid Valve: The tricuspid valve is normal in structure. Tricuspid valve regurgitation is trivial. No evidence of tricuspid stenosis. Aortic Valve: The aortic valve is normal in structure. There is mild calcification of the aortic valve. Aortic valve regurgitation is not visualized. No aortic stenosis is present. Pulmonic Valve: The pulmonic valve was grossly normal. Pulmonic valve regurgitation is not visualized. No evidence of pulmonic stenosis. Aorta: The aortic root is normal in size and structure. Venous: The inferior vena cava is normal in size with greater than 50% respiratory variability, suggesting right atrial pressure of 3  mmHg. IAS/Shunts: No atrial level shunt detected by color flow Doppler.  LEFT VENTRICLE PLAX 2D LVIDd:         3.20 cm     Diastology LVIDs:         2.10 cm     LV e' medial:    4.35 cm/s LV PW:         0.80 cm     LV E/e' medial:  14.0 LV IVS:        1.00 cm     LV e' lateral:   10.60 cm/s LVOT diam:     2.00 cm     LV E/e' lateral: 5.7 LV SV:         45 LV SV Index:   35          2D Longitudinal Strain LVOT Area:     3.14 cm    2D Strain GLS Avg:     -20.0 %  LV Volumes (MOD) LV vol d, MOD A2C: 21.6 ml LV vol d, MOD A4C: 43.6 ml LV vol s, MOD A2C: 7.8 ml LV vol s, MOD A4C: 14.7 ml LV SV MOD A2C:     13.8 ml LV SV MOD A4C:     43.6 ml LV SV MOD BP:      22.9 ml RIGHT VENTRICLE            IVC RV S prime:     7.07 cm/s  IVC diam: 1.40 cm TAPSE (M-mode): 0.8 cm LEFT ATRIUM           Index        RIGHT ATRIUM          Index LA diam:      2.60 cm 2.06 cm/m   RA Area:     3.48 cm LA Vol (A2C): 3.8 ml  2.99 ml/m   RA Volume:   4.05 ml  3.21 ml/m LA Vol (A4C): 18.7 ml 14.84 ml/m  AORTIC VALVE LVOT Vmax:   69.80 cm/s LVOT Vmean:  47.700 cm/s LVOT VTI:    0.142 m  AORTA Ao Root diam: 2.90 cm Ao Asc diam:  3.20 cm MITRAL VALVE               TRICUSPID VALVE MV Area (PHT): 2.62 cm    TR Peak grad:   23.8 mmHg MV Decel Time: 289 msec    TR Vmax:        244.00 cm/s MV E velocity: 60.80 cm/s MV A velocity: 64.70 cm/s  SHUNTS MV E/A ratio:  0.94        Systemic VTI:  0.14 m  Systemic Diam: 2.00 cm Jules Oar MD Electronically signed by Jules Oar MD Signature Date/Time: 12/19/2023/12:03:37 PM    Final     [facility] Clinician Interpretation: I have personally reviewed the radiological images as listed.  My interpretation, in the context of the patient's clinical presentation, is stable disease   Assessment/Plan Metastatic breast cancer to bone and brain  Martha [name] is clinically stable today.  MRI brain demonstrates progression of disease, with recurrence of "miliary"  metastatic pattern initially seen in August 2024.  At that time, she had elected for trial of Tucatinib  therapy for CNS penetrance; this led to good control of disease for 9 months until this current study.    Case was discussed in CNS tumor board this week, recommended transitioning to WBRT for salvage.  She understands the risks, including cognitive changes, memory impairments, and radiation necrosis from metastatic foci previously treated with radiosurgery.     She is agreeable to meet with radiation oncology team to discuss steps forward.  Will con't to follow with Dr. Maryalice Smaller in the meantime.  We appreciate the opportunity to participate in the care of Martha [name].   We ask that Martha [name] return to clinic in 3- 4 months following next brain MRI, or sooner as needed.  All questions were answered. The patient knows to call the clinic with any problems, questions or concerns. No barriers to learning were detected.  The total time spent in the encounter was 40 minutes and more than 50% was on counseling and review of test results   Mamie Searles, MD Medical Director of Neuro-Oncology [facility] at [place] 01/01/24 11:00 AM

## 2024-01-01 NOTE — Progress Notes (Signed)
 Radiation Oncology         [phone] ________________________________  Name: Martha [name] MRN: [id number]  Date: 01/01/2024  DOB: 05/13/1961  RECONSULT Note  Outpatient  CC: Rodney Clamp, MD  Mamie Searles, MD  Diagnosis and Prior Radiotherapy: C79.31    ICD-10-CM   1. Metastatic breast cancer to bone and brain  C79.51     2. Secondary cancer of brain ([name])  C79.31      CHIEF COMPLAINT: Here for follow-up and surveillance of brain metastases  Narrative:   I met with Martha [name] today after she was discussed yesterday our central nervous system tumor board.  Her brain MRI showed innumerable small brain metastases that are progressive outside of the previous radiosurgery targets.  While her brain disease had originally been controlled with systemic therapy it looks as though it has now broken through the effects of the systemic therapy.  The consensus at tumor board was that she should be considered for whole brain radiation if her purse performance status allows.  She was examined by neuro-oncology today and actually is doing relatively well with good systemic maintenance of her metastatic cancer while taking Xeloda , Tucatinib  and trastuzumab .  Last PET scan showed excellent control of disease in late March.   She is ambulating more independently and is not using her walker.  She reports chronic weakness in her right hand related to past cervical spine metastatic disease/surgery.  PREVIOUS RADIATION THERAPY:    Radiation treatment dates:   04/22/2018 - 06/03/2018 Site/dose:    1. Left Breast / 50 Gy in 25 fractions 2. Left SCV, axilla nodes / 50 Gy in 25 fractions 3. Left Breast Boost / 10 Gy in 5 fractions Beams/energy:    1. 3D / 6X Photon 2. 3D / 10X, 6X Photon 3. isodose / 6X Photon  First Treatment Date: 2022-09-20 - Last Treatment Date: 2022-09-20  Plan Name: Brain_SRS Site: Brain Technique: SBRT/SRT-IMRT Mode: Photon Dose Per Fraction: 20  Gy Prescribed Dose (Delivered / Prescribed): 20 Gy / 20 Gy Prescribed Fxs (Delivered / Prescribed): 1 / 1   First Treatment Date: 2022-09-20 - Last Treatment Date: 2022-10-13  Plan Name: Spine_C_SUP Site: Cervical Spine Technique: 3D Mode: Photon Dose Per Fraction: 3 Gy Prescribed Dose (Delivered / Prescribed): 30 Gy / 30 Gy Prescribed Fxs (Delivered / Prescribed): 10 / 10   Plan Name: Brain_SRS Site: Brain Technique: SBRT/SRT-IMRT Mode: Photon Dose Per Fraction: 20 Gy Prescribed Dose (Delivered / Prescribed): 20 Gy / 20 Gy Prescribed Fxs (Delivered / Prescribed): 1 / 1   Plan Name: Spine_C_INF Site: Cervical Spine Technique: 3D Mode: Photon Dose Per Fraction: 3 Gy Prescribed Dose (Delivered / Prescribed): 30 Gy / 30 Gy Prescribed Fxs (Delivered / Prescribed): 10 / 10   Plan Name: Spine_L5-S3 Site: Lumbar Spine Technique: 3D Mode: Photon Dose Per Fraction: 8 Gy Prescribed Dose (Delivered / Prescribed): 8 Gy / 8 Gy Prescribed Fxs (Delivered / Prescribed): 1 / 1    ALLERGIES:  is allergic to morphine and codeine.  Meds: Current Outpatient Medications  Medication Sig Dispense Refill   acetaminophen  (TYLENOL ) 500 MG tablet Take 2 tablets (1,000 mg total) by mouth every 8 (eight) hours. 30 tablet 0   capecitabine  (XELODA ) 500 MG tablet TAKE 2 TABLETS TWICE DAILY FOR 7 DAYS ON THEN 7 DAYS OFF. TAKE AFTER MEALS 56 tablet 2   citalopram  (CELEXA ) 40 MG tablet TAKE 1 TABLET BY MOUTH EVERY DAY 90 tablet 2   diphenoxylate -atropine  (LOMOTIL ) 2.5-0.025  MG tablet Take 1 tablet by mouth 4 (four) times daily as needed for diarrhea or loose stools. 90 tablet 3   levothyroxine  (SYNTHROID ) 75 MCG tablet Take 1 tablet (75 mcg total) by mouth daily before breakfast. 90 tablet 3   lidocaine  (XYLOCAINE ) 2 % solution Patient: Mix 1part 2% viscous lidocaine , 1part H20. Swallow 10mL of diluted mixture, before meals and at bedtime, up to QID (Patient not taking: Reported on 01/01/2024) 100 mL  3   loperamide  (IMODIUM  A-D) 2 MG tablet Take 1 tablet (2 mg total) by mouth 4 (four) times daily as needed for diarrhea or loose stools. 90 tablet 0   LORazepam  (ATIVAN ) 0.5 MG tablet Take 1 tablet (0.5 mg total) by mouth every 6 (six) hours as needed for anxiety (agitation). (Patient not taking: Reported on 01/01/2024) 30 tablet 0   Mouthwashes (MOUTH RINSE) LIQD solution 15 mLs by Mouth Rinse route as needed (for oral care).  0   nicotine  (NICODERM CQ  - DOSED IN MG/24 HR) 7 mg/24hr patch Place 1 patch (7 mg total) onto the skin daily. 28 patch 0   ondansetron  (ZOFRAN ) 8 MG tablet Take 1 tablet (8 mg total) by mouth every 8 (eight) hours as needed for nausea or vomiting. (Patient not taking: Reported on 01/01/2024) 20 tablet 1   OVER THE COUNTER MEDICATION Take 1 tablet by mouth daily. Protandim Supplement     pregabalin  (LYRICA ) 25 MG capsule Take 1 capsule (25 mg total) by mouth 2 (two) times daily. 60 capsule 2   senna-docusate (SENOKOT-S) 8.6-50 MG tablet Take 1 tablet by mouth 2 (two) times daily. (Patient not taking: Reported on 01/01/2024) 60 tablet 0   tucatinib  (TUKYSA ) 150 MG tablet Take 2 tablets (300 mg total) by mouth 2 (two) times daily. Take as instructed per MD. 120 tablet 2   No current facility-administered medications for this encounter.    Physical Findings: Gen: The patient is in no acute distress. Patient is alert and oriented. Thin HEENT: facial symmetry, no thrush; resolved alopecia. Neck: Muscle atrophy in neck Musculoskeletal: symmetric strength throughout (except some weakness in right hand), ambulating independently.  Ext: No edema Neuro: intact sensation, CN grossly intact. No obvious neurologic focalities  KPS = 70  100 - Normal; no complaints; no evidence of disease. 90   - Able to carry on normal activity; minor signs or symptoms of disease. 80   - Normal activity with effort; some signs or symptoms of disease. 79   - Cares for self; unable to carry on normal  activity or to do active work. 60   - Requires occasional assistance, but is able to care for most of his personal needs. 50   - Requires considerable assistance and frequent medical care. 40   - Disabled; requires special care and assistance. 30   - Severely disabled; hospital admission is indicated although death not imminent. 20   - Very sick; hospital admission necessary; active supportive treatment necessary. 10   - Moribund; fatal processes progressing rapidly. 0     - Dead  Karnofsky DA, Abelmann WH, Craver LS and Burchenal [facility] [phone]) The use of the nitrogen mustards in the palliative treatment of carcinoma: with particular reference to bronchogenic carcinoma Cancer 1 [id number]   Lab Findings: Lab Results  Component Value Date   WBC 5.6 12/20/2023   HGB 14.7 12/20/2023   HCT 41.8 12/20/2023   MCV 100.0 12/20/2023   PLT 212 12/20/2023    Radiographic Findings: MR BRAIN W  WO CONTRAST Result Date: 12/28/2023 CLINICAL DATA:  Brain/CNS neoplasm, assess treatment response. History of metastatic breast cancer. EXAM: MRI HEAD WITHOUT AND WITH CONTRAST TECHNIQUE: Multiplanar, multiecho pulse sequences of the brain and surrounding structures were obtained without and with intravenous contrast. CONTRAST:  4 mL Vueway  COMPARISON:  Head MRI 08/30/2023 and 05/30/2023 FINDINGS: Brain: Innumerable punctate enhancing lesions are present throughout the cerebellum in both cerebral hemispheres and have greatly increased in number from the prior studies. Some pre-existing lesions have increased in size, for example 4 mm lesions in the right lentiform nucleus and posterosuperior left temporal lobe (series 13, images 64 and 72, previously 2-3 mm each). Extensive bilateral basal ganglia involvement is again noted with lesions being confluent in these regions. There is no significant brain edema. No acute infarct, intracranial hemorrhage, midline shift, hydrocephalus, or extra-axial fluid collection is identified.  There is a background of mild chronic small vessel ischemia in the cerebral white matter. A 7 mm peripherally enhancing lesion in the left cerebellopontine angle cistern is unchanged (series 14, image 36). Vascular: Major intracranial vascular flow voids are preserved. Skull and upper cervical spine: Similar appearance of diffusely heterogeneous bone marrow signal including in the clivus compatible with known metastatic disease. Partially visualized posterior cervical spinal fusion. Sinuses/Orbits: Unremarkable orbits. Paranasal sinuses and mastoid air cells are clear. Other: None. IMPRESSION: Progressive intracranial metastatic disease with innumerable predominantly punctate enhancing lesions. No edema. Electronically Signed   By: Aundra Lee M.D.   On: 12/28/2023 13:36   ECHOCARDIOGRAM COMPLETE Result Date: 12/19/2023    ECHOCARDIOGRAM REPORT   Patient Name:   Martha [name] [facility] Date of Exam: 12/19/2023 Medical Rec #:  [id number]             Height:       62.0 in Accession #:    [id number]            Weight:       75.0 lb Date of Birth:  1961/05/08             BSA:          1.260 m Patient Age:    62 years              BP:           116/66 mmHg Patient Gender: F                     HR:           82 bpm. Exam Location:  Outpatient Procedure: 2D Echo, Cardiac Doppler, Color Doppler and Strain Analysis (Both            Spectral and Color Flow Doppler were utilized during procedure). Indications:    Z51.11 Encounter for antineoplastic chemotheraphy  History:        Patient has prior history of Echocardiogram examinations, most                 recent 08/09/2023. Signs/Symptoms:Altered Mental Status; Risk                 Factors:Current Smoker. Breast cancer. Metastatic brain cancer.  Sonographer:    Raynelle Callow RDCS Referring Phys: [id number] [facility]  Sonographer Comments: Technically difficult study due to poor echo windows. Difficult study due to extreemly thin habitus. IMPRESSIONS  1. Left ventricular ejection  fraction, by estimation, is 60 to 65%. The left ventricle has normal function. The left ventricle has no regional wall motion abnormalities. Left ventricular  diastolic parameters are consistent with Grade I diastolic dysfunction (impaired relaxation). The average left ventricular global longitudinal strain is -20.0 %. The global longitudinal strain is normal.  2. Right ventricular systolic function is normal. The right ventricular size is normal. There is normal pulmonary artery systolic pressure.  3. The mitral valve is normal in structure. Trivial mitral valve regurgitation. No evidence of mitral stenosis.  4. The aortic valve is normal in structure. There is mild calcification of the aortic valve. Aortic valve regurgitation is not visualized. No aortic stenosis is present.  5. The inferior vena cava is normal in size with greater than 50% respiratory variability, suggesting right atrial pressure of 3 mmHg. FINDINGS  Left Ventricle: Left ventricular ejection fraction, by estimation, is 60 to 65%. The left ventricle has normal function. The left ventricle has no regional wall motion abnormalities. The average left ventricular global longitudinal strain is -20.0 %. Strain was performed and the global longitudinal strain is normal. The left ventricular internal cavity size was normal in size. There is no left ventricular hypertrophy. Left ventricular diastolic parameters are consistent with Grade I diastolic dysfunction (impaired relaxation). Right Ventricle: The right ventricular size is normal. No increase in right ventricular wall thickness. Right ventricular systolic function is normal. There is normal pulmonary artery systolic pressure. The tricuspid regurgitant velocity is 2.44 m/s, and  with an assumed right atrial pressure of 3 mmHg, the estimated right ventricular systolic pressure is 26.8 mmHg. Left Atrium: Left atrial size was normal in size. Right Atrium: Right atrial size was normal in size. Pericardium:  There is no evidence of pericardial effusion. Mitral Valve: The mitral valve is normal in structure. Trivial mitral valve regurgitation. No evidence of mitral valve stenosis. Tricuspid Valve: The tricuspid valve is normal in structure. Tricuspid valve regurgitation is trivial. No evidence of tricuspid stenosis. Aortic Valve: The aortic valve is normal in structure. There is mild calcification of the aortic valve. Aortic valve regurgitation is not visualized. No aortic stenosis is present. Pulmonic Valve: The pulmonic valve was grossly normal. Pulmonic valve regurgitation is not visualized. No evidence of pulmonic stenosis. Aorta: The aortic root is normal in size and structure. Venous: The inferior vena cava is normal in size with greater than 50% respiratory variability, suggesting right atrial pressure of 3 mmHg. IAS/Shunts: No atrial level shunt detected by color flow Doppler.  LEFT VENTRICLE PLAX 2D LVIDd:         3.20 cm     Diastology LVIDs:         2.10 cm     LV e' medial:    4.35 cm/s LV PW:         0.80 cm     LV E/e' medial:  14.0 LV IVS:        1.00 cm     LV e' lateral:   10.60 cm/s LVOT diam:     2.00 cm     LV E/e' lateral: 5.7 LV SV:         45 LV SV Index:   35          2D Longitudinal Strain LVOT Area:     3.14 cm    2D Strain GLS Avg:     -20.0 %  LV Volumes (MOD) LV vol d, MOD A2C: 21.6 ml LV vol d, MOD A4C: 43.6 ml LV vol s, MOD A2C: 7.8 ml LV vol s, MOD A4C: 14.7 ml LV SV MOD A2C:     13.8 ml LV  SV MOD A4C:     43.6 ml LV SV MOD BP:      22.9 ml RIGHT VENTRICLE            IVC RV S prime:     7.07 cm/s  IVC diam: 1.40 cm TAPSE (M-mode): 0.8 cm LEFT ATRIUM           Index        RIGHT ATRIUM          Index LA diam:      2.60 cm 2.06 cm/m   RA Area:     3.48 cm LA Vol (A2C): 3.8 ml  2.99 ml/m   RA Volume:   4.05 ml  3.21 ml/m LA Vol (A4C): 18.7 ml 14.84 ml/m  AORTIC VALVE LVOT Vmax:   69.80 cm/s LVOT Vmean:  47.700 cm/s LVOT VTI:    0.142 m  AORTA Ao Root diam: 2.90 cm Ao Asc diam:  3.20 cm  MITRAL VALVE               TRICUSPID VALVE MV Area (PHT): 2.62 cm    TR Peak grad:   23.8 mmHg MV Decel Time: 289 msec    TR Vmax:        244.00 cm/s MV E velocity: 60.80 cm/s MV A velocity: 64.70 cm/s  SHUNTS MV E/A ratio:  0.94        Systemic VTI:  0.14 m                            Systemic Diam: 2.00 cm Jules Oar MD Electronically signed by Jules Oar MD Signature Date/Time: 12/19/2023/12:03:37 PM    Final       Impression/Plan:   Today, I talked to the patient about the findings and work-up thus far.  We discussed the patient's diagnosis of progressive brain metastases with innumerable lesions in the brain after previous SRS and general treatment for this, highlighting the role of radiotherapy in the management.  We discussed the available radiation techniques, and focused on the details of logistics and delivery.   Since her disease has become refractory to systemic therapy we are at a juncture where it is reasonable for her to undergo whole brain radiation therapy.  She will continue systemic therapy as it is controlling her other sites of disease well as evidenced by recent PET scan.  I did advise her to hold capecitabine  while she goes the radiation therapy to her brain and Dr. Maryalice Smaller will remind her of this at her appointment in a week and a half.  We will schedule her for radiation planning in the near future.  We discussed the risks, benefits, and side effects of radiotherapy. Side effects may include but not necessarily be limited to: Skin irritation, fatigue, hair loss, headache, nausea, cognitive decline, injury to the brain/brainstem; no guarantees of treatment were given. A consent form was signed and placed in the patient's medical record. The patient was encouraged to ask questions that I answered to the best of my ability.     On date of service, in total, I spent 30 minutes on this encounter. Patient was seen in person.  _____________________________________     Colie Dawes, MD

## 2024-01-03 ENCOUNTER — Telehealth: Payer: Self-pay | Admitting: Internal Medicine

## 2024-01-03 NOTE — Telephone Encounter (Signed)
 Martha [name] scheduled her appointment.

## 2024-01-07 ENCOUNTER — Telehealth: Payer: Self-pay

## 2024-01-07 NOTE — Telephone Encounter (Signed)
 Called the pt to let them we needed an update ROI on file. I emailed the pt the form, and waiting for signature.

## 2024-01-08 ENCOUNTER — Other Ambulatory Visit: Payer: Self-pay

## 2024-01-08 ENCOUNTER — Ambulatory Visit
Admission: RE | Admit: 2024-01-08 | Discharge: 2024-01-08 | Disposition: A | Discharge status: Home / Self Care | Source: Ambulatory Visit | Attending: Radiation Oncology | Admitting: Radiation Oncology

## 2024-01-08 DIAGNOSIS — Z510 Encounter for antineoplastic radiation therapy: Secondary | ICD-10-CM | POA: Insufficient documentation

## 2024-01-08 DIAGNOSIS — C7951 Secondary malignant neoplasm of bone: Secondary | ICD-10-CM

## 2024-01-08 DIAGNOSIS — C7931 Secondary malignant neoplasm of brain: Secondary | ICD-10-CM | POA: Insufficient documentation

## 2024-01-08 DIAGNOSIS — C50312 Malignant neoplasm of lower-inner quadrant of left female breast: Secondary | ICD-10-CM | POA: Diagnosis not present

## 2024-01-08 DIAGNOSIS — Z170 Estrogen receptor positive status [ER+]: Secondary | ICD-10-CM | POA: Diagnosis not present

## 2024-01-08 MED ORDER — TUCATINIB 150 MG PO TABS: 300.0000 mg | ORAL_TABLET | Freq: Two times a day (BID) | ORAL | 2 refills | Status: DC

## 2024-01-09 NOTE — Progress Notes (Unsigned)
 Symptom Management Consult Note [facility]    Patient Care Team: Rodney Clamp, MD as PCP - General (Family Medicine) Lockie Rima, MD as Consulting Physician (General Surgery) Sonja Lineville, MD as Consulting Physician (Hematology) Colie Dawes, MD as Attending Physician (Radiation Oncology) [name], [name], NP as Nurse Practitioner (Nurse Practitioner)    Name / MRN / DOB: Martha [name]  [id number]  02-17-1961   Date of visit: 01/10/2024   Chief Complaint/Reason for visit: toxicity check   Current Therapy: Xeloda , Tucatinib  and trastuzumab   Zometa  every 3 months  Last treatment:  Day 1   Cycle 12 on 12/20/23    ASSESSMENT AND PLAN Patient is a 63 y.o. female with oncologic history of metastatic breast cancer to bone and brain followed by Dr. Maryalice Smaller.  I have viewed most recent oncology note and lab work.  #Metastatic breast cancer to bone and brain - Whole brain radiation to start next week. Patient aware she needs to  hold xeloda  while on radiation per rad onc MD - Patient tolerating treatment well and has no complaints today. Labs and vitals are within parameters for treatment today.  - Next appointment with oncologist is 01/31/24  #Peripheral neuropathy - stable per patient. - Patient see palliative care NP today and Lyrica  was increased to 3x daily.    HEME/ONC HISTORY Oncology History Overview Note  Cancer Staging Malignant neoplasm of lower-inner quadrant of left breast in female, estrogen receptor positive ([name]) Staging form: Breast, AJCC 8th Edition - Clinical stage from 01/16/2018: Stage IA (cT1c, cN0, cM0, G2, ER+, PR+, HER2-) - Signed by Sonja Monmouth, MD on 01/23/2018 - Pathologic: Stage IA (pT1c, pN1, cM0, G1, ER+, PR+, HER2-) - Signed by Colie Dawes, MD on 04/09/2018     Malignant neoplasm of lower-inner quadrant of left breast in female, estrogen receptor positive ([name])  01/15/2018 Mammogram   Diagnositc Mammogram 01/15/18   IMPRESSION: 1. Suspicious 1.2 x 1.4 x 1.3 cm mixed echogenicity mass left breast 7 o'clock position retroareolar location at the site of palpable concern.. 2. Indeterminate Within the left breast 7:30 o'clock retroareolar location, adjacent to the palpable mass, is a 0.5 x 0.4 x 0.5 cm oval circumscribed hypoechoic mass. 3. Indeterminate calcifications within the lateral left breast. Location of these calcifications is not definitely confirmed on the true lateral view.    01/16/2018 Initial Biopsy   Diagnosis 01/16/18 1. Breast, left, needle core biopsy, 7:30 o'clock (ribbon clip) - FIBROCYSTIC CHANGES WITH SCLEROSING ADENOSIS AND CALCIFICATIONS. - FIBROADENOMATOID CHANGE. - NO MALIGNANCY IDENTIFIED. 2. Breast, left, needle core biopsy, 7 o'clock position (coil clip) - INVASIVE MAMMARY CARCINOMA, MSBR GRADE I/II. - SEE MICROSCOPIC DESCRIPTION Microscopic Comment  ADDENDUM: Immunohistochemistry for E-Cadherin is strongly positive in the tumor consistent with ductal carcinoma. (JDP:ah 01/17/18)   01/16/2018 Receptors her2   Estrogen Receptor: 100%, POSITIVE, STRONG STAINING INTENSITY Progesterone Receptor: 50%, POSITIVE, STRONG STAINING INTENSITY Proliferation Marker Ki67: 20% HER2 Negative   01/16/2018 Cancer Staging   Staging form: Breast, AJCC 8th Edition - Clinical stage from 01/16/2018: Stage IA (cT1c, cN0, cM0, G2, ER+, PR+, HER2-) - Signed by Sonja Indianola, MD on 01/23/2018   01/22/2018 Initial Diagnosis   Malignant neoplasm of lower-inner quadrant of left breast in female, estrogen receptor positive ([name])   02/21/2018 Surgery    LEFT BREAST LUMPECTOMY WITH AXILLARY LYMPH NODE BIOPSY by Dr. Cherlynn Cornfield  02/21/18   02/21/2018 Pathology Results   Diagnosis 02/21/18 1. Breast, lumpectomy, Left - INVASIVE DUCTAL CARCINOMA, GRADE  I, 1.6 CM. - DUCTAL CARCINOMA IN SITU, INTERMEDIATE NUCLEAR GRADE. - ANTERIOR AND MEDIAL RESECTION MARGINS ARE POSITIVE FOR CARCINOMA. - NEGATIVE FOR  LYMPHOVASCULAR OR PERINEURAL INVASION. - BACKGROUND BREAST TISSUE WITH FIBROCYSTIC CHANGE, INCLUDING SCLEROSING ADENOSIS. - BIOPSY SITE CHANGES. - SEE ONCOLOGY TABLE. 2. Lymph node, sentinel, biopsy, Left Axillary #1 - METASTATIC BREAST CARCINOMA TO A LYMPH NODE, 1.0 CM IN GREATEST DIMENSION, WITH EXTRANODAL EXTENSION (1/1). 3. Lymph node, sentinel, biopsy, Left Axillary #2 - LYMPH NODE, NEGATIVE FOR CARCINOMA (0/1).    02/21/2018 Miscellaneous   Mammaprint 02/21/18 Low Risk with 10-year risk of recurrnce at 10% -No potential signifcant chemotherapy benefit   03/20/2018 Pathology Results   RE-EXCISION OF BREAST LUMPECTOMY by Dr. Cherlynn Cornfield  Diagnosis 03/20/18 1. Breast, excision, Left new anterior margin - FIBROCYSTIC CHANGES WITH ADENOSIS AND CALCIFICATIONS. - HEALING BIOPSY SITE. - THERE IS NO EVIDENCE OF MALIGNANCY. 2. Breast, excision, Left new medial margin - FIBROCYSTIC CHANGES WITH ADENOSIS AND CALCIFICATIONS. - HEALING BIOPSY SITE. - THERE IS NO EVIDENCE OF MALIGNANCY. Microscopic Comment 1. -2. The surgical resection margin(s) of the specimen were inked and microscopically evaluated. (JBK:kh 03-22-18)   04/09/2018 Cancer Staging   Staging form: Breast, AJCC 8th Edition - Pathologic: Stage IA (pT1c, pN1, cM0, G1, ER+, PR+, HER2-) - Signed by Colie Dawes, MD on 04/09/2018   04/22/2018 - 06/03/2018 Radiation Therapy   Radaiton with Dr. Lurena Sally 04/22/18-06/03/18   05/2018 -  Anti-estrogen oral therapy   Letrozole  2.5mg  started 05/2018    Survivorship   Per Delvin File, NP    05/04/2022 Imaging    IMPRESSION: Cervical spondylosis, as described.   Nonspecific straightening of the expected cervical lordosis.   07/24/2022 Imaging    IMPRESSION: 1. Extensive osseous metastatic disease with pathologic fracture at the base of dens and C2 right lateral mass. Extraosseous tumor at C1 and C2 likely impinging on the right C2 and C3 nerve roots. 2. Degenerative cord impingement at  C4-5 to C6-7. Biforaminal impingement at C5-6 and C6-7.   08/03/2022 PET scan    IMPRESSION: 1. Large volume osseous metastasis. 2. Low right cervical and probable right axillary nodal metastasis. 3. Subtle heterogeneous activity throughout the liver with suggestion of small liver lesions (likely new compared to chest CT of 07/16/2020). Findings are overall moderately suspicious for hepatic metastasis. Pre and post contrast abdominal MRI (preferred) or CT could confirm. 4. Right-sided pleural thickening and trace pleural fluid. Right base airspace disease is favored to represent chronic atelectasis. 5. Aortic atherosclerosis (ICD10-I70.0) and emphysema (ICD10-J43.9).     08/11/2022 Genetic Testing   Negative genetic testing on the CancerNext-Expanded+RNAinsight panel.  The report date is August 11, 2022.  The CancerNext-Expanded gene panel offered by [facility] and includes sequencing and rearrangement analysis for the following 77 genes: AIP, ALK, APC*, ATM*, AXIN2, BAP1, BARD1, BLM, BMPR1A, BRCA1*, BRCA2*, BRIP1*, CDC73, CDH1*, CDK4, CDKN1B, CDKN2A, CHEK2*, CTNNA1, DICER1, FANCC, FH, FLCN, GALNT12, KIF1B, LZTR1, MAX, MEN1, MET, MLH1*, MSH2*, MSH3, MSH6*, MUTYH*, NBN, NF1*, NF2, NTHL1, PALB2*, PHOX2B, PMS2*, POT1, PRKAR1A, PTCH1, PTEN*, RAD51C*, RAD51D*, RB1, RECQL, RET, SDHA, SDHAF2, SDHB, SDHC, SDHD, SMAD4, SMARCA4, SMARCB1, SMARCE1, STK11, SUFU, TMEM127, TP53*, TSC1, TSC2, VHL and XRCC2 (sequencing and deletion/duplication); EGFR, EGLN1, HOXB13, KIT, MITF, PDGFRA, POLD1, and POLE (sequencing only); EPCAM and GREM1 (deletion/duplication only). DNA and RNA analyses performed for * genes.    Lobular carcinoma in situ (LCIS) of right breast  01/20/2020 Mammogram   Diagnostic Mammogram 01/20/20 IMPRESSION: 1.  Stable post lumpectomy changes of the  left breast.   2. Suspicious microcalcifications over the right upper outer quadrant spanning 3.6 cm.   02/02/2020 Initial Biopsy    Diagnosis 02/02/20 Breast, right, needle core biopsy, upper outer quadrant, x clip - LOBULAR CARCINOMA IN SITU WITH PLEOMORPHIC FEATURES AND CALCIFICATIONS, INVOLVING ADENOSIS. SEE NOTE Diagnosis Note Immunohistochemical stain for E-cadherin is negative in the lesional cells, consistent with a lobular phenotype. Immunostains for p63, SMM 1 and calponin do not show evidence of invasive carcinoma.    02/04/2020 Initial Diagnosis   Lobular carcinoma in situ (LCIS) of right breast   03/18/2020 Surgery   RIGHT BREAST LUMPECTOMY WITH RADIOACTIVE SEED LOCALIZATION by Dr Unknown Garbe    03/18/2020 Pathology Results   FINAL MICROSCOPIC DIAGNOSIS:   A. BREAST, RIGHT, LUMPECTOMY:  - Pleomorphic lobular carcinoma in situ with calcifications and  underlying complex sclerosing lesion, adenosis and fibroadenomatoid  change.  - Margins of resection are not involved (Closest margins: < 1 mm,  anterior, posterior, inferior and medial).  - Biopsy site.    COMMENT:   P63, Calponin and SMM-1 demonstrate the presence of myoepithelium in the  select focus.    Metastatic breast cancer to bone and brain  05/04/2022 Imaging    IMPRESSION: Cervical spondylosis, as described.   Nonspecific straightening of the expected cervical lordosis.   07/24/2022 Imaging    IMPRESSION: 1. Extensive osseous metastatic disease with pathologic fracture at the base of dens and C2 right lateral mass. Extraosseous tumor at C1 and C2 likely impinging on the right C2 and C3 nerve roots. 2. Degenerative cord impingement at C4-5 to C6-7. Biforaminal impingement at C5-6 and C6-7.   08/02/2022 Initial Diagnosis   Cancer, metastatic to bone ([name])   08/03/2022 PET scan    IMPRESSION: 1. Large volume osseous metastasis. 2. Low right cervical and probable right axillary nodal metastasis. 3. Subtle heterogeneous activity throughout the liver with suggestion of small liver lesions (likely new compared to chest CT of 07/16/2020).  Findings are overall moderately suspicious for hepatic metastasis. Pre and post contrast abdominal MRI (preferred) or CT could confirm. 4. Right-sided pleural thickening and trace pleural fluid. Right base airspace disease is favored to represent chronic atelectasis. 5. Aortic atherosclerosis (ICD10-I70.0) and emphysema (ICD10-J43.9).     10/26/2022 - 04/12/2023 Chemotherapy   Patient is on Treatment Plan : BREAST Paclitaxel  D1,8,15 + Trastuzumab  D1 + Pertuzumab  D1 q21d x 8 cycles / Trastuzumab  D1 + Pertuzumab  D1 q21d x 4 cycles     05/03/2023 -  Chemotherapy   Patient is on Treatment Plan : BREAST MAINTENANCE Trastuzumab  IV (6) or SQ (600) D1 q21d X 11 Cycles         INTERVAL HISTORY  Discussed the use of AI scribe software for clinical note transcription with the patient, who gave verbal consent to proceed.    Martha [name] is a 63 y.o. female with oncologic history as above presenting to [facility] today with chief complaint of toxicity check. Accompanied to clinic today by spouse who provides additional history.  Patient has no complaints today. She is feeling well overall and is pleased with how active she has been recently. She denies rash, diarrhea, nausea. She has fatigue that is manageable with a nap.She is taking Xeloda  this week as prescribed. She is also seeing our palliative care team today.   ROS  All other systems are reviewed and are negative for acute change except as noted in the HPI.    Allergies  Allergen Reactions   Morphine And Codeine  Other (See Comments)    "Makes her angry"     Past Medical History:  Diagnosis Date   Anxiety    Cancer ([name]) 2022   right breast LCIS   Cancer ([name]) 2019   left breast   Depression    Dysrhythmia    SVT, s/p ablation ~ 2012 in at [facility]   Ectopic pregnancy    Family history of breast cancer    Family history of melanoma    History of radiation therapy 04/22/18-06/03/18   Left Breast, left SCV,  axilla 50 Gy in 25 fractions, Left breast boost 10 Gy in 5 fractions.    Hypothyroidism    Personal history of radiation therapy    PONV (postoperative nausea and vomiting)    Thyroid  disease      Past Surgical History:  Procedure Laterality Date   APPENDECTOMY     BILATERAL SALPINGECTOMY     BREAST BIOPSY Right 2014   fibroadenoma   BREAST BIOPSY Left 01/16/2018   BREAST BIOPSY Right 02/02/2020   LCIS   BREAST BIOPSY Right 08/04/2020   x2 LCIS   BREAST BIOPSY Right 08/13/2020   x2   BREAST EXCISIONAL BIOPSY Left 1990   benign   BREAST EXCISIONAL BIOPSY Right 02/02/2020   LCIS   BREAST LUMPECTOMY Left 01/22/2018   BREAST LUMPECTOMY WITH AXILLARY LYMPH NODE BIOPSY Left 02/21/2018   Procedure: LEFT BREAST LUMPECTOMY WITH AXILLARY LYMPH NODE BIOPSY;  Surgeon: Lockie Rima, MD;  Location: [facility] OR;  Service: General;  Laterality: Left;   BREAST LUMPECTOMY WITH RADIOACTIVE SEED LOCALIZATION Right 03/18/2020   Procedure: RIGHT BREAST LUMPECTOMY WITH RADIOACTIVE SEED LOCALIZATION;  Surgeon: Lockie Rima, MD;  Location: [facility] OR;  Service: General;  Laterality: Right;   BREAST SURGERY Left 1993   cyst removed   ENDOMETRIAL ABLATION     EYE SURGERY     GLAUCOMA SURGERY Bilateral    IR IMAGING GUIDED PORT INSERTION  10/16/2022   POSTERIOR CERVICAL FUSION/FORAMINOTOMY N/A 08/17/2022   Procedure: Cervical One-Cervical Four POSTERIOR CERVICAL FUSION,  Cervical One LAMINECTOMY REDUCTION OF Cervical Two Fracture, Biopsy of Right Cerical Two Pars  Lesion;  Surgeon: Van Gelinas, MD;  Location: [facility] OR;  Service: Neurosurgery;  Laterality: N/A;   POSTERIOR CERVICAL FUSION/FORAMINOTOMY N/A 09/06/2022   Procedure: Posterior Cervical Fusion, Foraminotomy , Cervical Five-Six, Cervical Six-Seven; Extension of  fusion Cervical Four- Thoracic One;  Surgeon: Van Gelinas, MD;  Location: [facility] OR;  Service: Neurosurgery;  Laterality: N/A;   RADIOACTIVE SEED GUIDED EXCISIONAL BREAST BIOPSY Right  04/28/2021   Procedure: RADIOACTIVE SEED GUIDED EXCISIONAL RIGHT BREAST BIOPSY X2;  Surgeon: Lockie Rima, MD;  Location: [facility];  Service: General;  Laterality: Right;   RE-EXCISION OF BREAST LUMPECTOMY Left 03/20/2018   Procedure: RE-EXCISION OF BREAST LUMPECTOMY;  Surgeon: Lockie Rima, MD;  Location: [facility];  Service: General;  Laterality: Left;    Social History   Socioeconomic History   Marital status: Married    Spouse name: Not on file   Number of children: 0   Years of education: Not on file   Highest education level: Not on file  Occupational History   Not on file  Tobacco Use   Smoking status: Every Day    Current packs/day: 0.50    Average packs/day: 0.5 packs/day for 40.0 years (20.0 ttl pk-yrs)    Types: Cigarettes   Smokeless tobacco: Never  Vaping Use   Vaping status: Former   Quit  date: 09/27/2013  Substance and Sexual Activity   Alcohol use: Not Currently    Alcohol/week: 28.0 standard drinks of alcohol    Types: 28 Cans of beer per week    Comment: drinks 4 beers daily   Drug use: No   Sexual activity: Yes    Birth control/protection: Post-menopausal    Comment: endometrial ablasion  Other Topics Concern   Not on file  Social History Narrative   1 stepdaughter   Social Drivers of Health   Financial Resource Strain: Not on file  Food Insecurity: No Food Insecurity (10/23/2022)   Hunger Vital Sign    Worried About Running Out of Food in the Last Year: Never true    Ran Out of Food in the Last Year: Never true  Transportation Needs: No Transportation Needs (10/23/2022)   PRAPARE - Administrator, Civil Service (Medical): No    Lack of Transportation (Non-Medical): No  Physical Activity: Not on file  Stress: Not on file  Social Connections: Not on file  Intimate Partner Violence: Not At Risk (09/04/2022)   Humiliation, Afraid, Rape, and Kick questionnaire    Fear of Current or Ex-Partner: No     Emotionally Abused: No    Physically Abused: No    Sexually Abused: No    Family History  Problem Relation Age of Onset   Heart disease Mother    Pneumonia Father    Hyperlipidemia Sister    Melanoma Sister 48       hx of 5 melanomas   Lung cancer Maternal Uncle        mesothelioma   Pneumonia Maternal Uncle    Dementia Paternal Grandmother    Lung disease Paternal Grandfather    Breast cancer Other        MGMs sister dx < 50   Breast cancer Other        PGFs sister dx < 50   Breast cancer Cousin        mother's mat first cousin, dx < 50     Current Outpatient Medications:    acetaminophen  (TYLENOL ) 500 MG tablet, Take 2 tablets (1,000 mg total) by mouth every 8 (eight) hours., Disp: 30 tablet, Rfl: 0   capecitabine  (XELODA ) 500 MG tablet, TAKE 2 TABLETS TWICE DAILY FOR 7 DAYS ON THEN 7 DAYS OFF. TAKE AFTER MEALS, Disp: 56 tablet, Rfl: 2   citalopram  (CELEXA ) 40 MG tablet, TAKE 1 TABLET BY MOUTH EVERY DAY, Disp: 90 tablet, Rfl: 2   diphenoxylate -atropine  (LOMOTIL ) 2.5-0.025 MG tablet, Take 1 tablet by mouth 4 (four) times daily as needed for diarrhea or loose stools., Disp: 90 tablet, Rfl: 3   levothyroxine  (SYNTHROID ) 75 MCG tablet, Take 1 tablet (75 mcg total) by mouth daily before breakfast., Disp: 90 tablet, Rfl: 3   loperamide  (IMODIUM  A-D) 2 MG tablet, Take 1 tablet (2 mg total) by mouth 4 (four) times daily as needed for diarrhea or loose stools., Disp: 90 tablet, Rfl: 0   Mouthwashes (MOUTH RINSE) LIQD solution, 15 mLs by Mouth Rinse route as needed (for oral care)., Disp: , Rfl: 0   nicotine  (NICODERM CQ  - DOSED IN MG/24 HR) 7 mg/24hr patch, Place 1 patch (7 mg total) onto the skin daily., Disp: 28 patch, Rfl: 0   ondansetron  (ZOFRAN ) 8 MG tablet, Take 1 tablet (8 mg total) by mouth every 8 (eight) hours as needed for nausea or vomiting., Disp: 20 tablet, Rfl: 1   OVER THE COUNTER MEDICATION, Take 1  tablet by mouth daily. Protandim Supplement, Disp: , Rfl:    pregabalin   (LYRICA ) 25 MG capsule, Take 1 capsule (25 mg total) by mouth 3 (three) times daily., Disp: 90 capsule, Rfl: 3   tucatinib  (TUKYSA ) 150 MG tablet, Take 2 tablets (300 mg total) by mouth 2 (two) times daily. Take as instructed per MD., Disp: 120 tablet, Rfl: 2  PHYSICAL EXAM ECOG FS:1 - Symptomatic but completely ambulatory    Vitals:   01/10/24 1054  BP: 130/78  Pulse: 79  Resp: 17  Temp: 98.1 F (36.7 C)  TempSrc: Temporal  SpO2: 96%  Weight: 76 lb 3.2 oz (34.6 kg)  Height: 5\' 2"  (1.575 m)   Physical Exam Vitals and nursing note reviewed.  Constitutional:      Appearance: She is not ill-appearing or toxic-appearing.  HENT:     Head: Normocephalic.  Eyes:     Conjunctiva/sclera: Conjunctivae normal.  Cardiovascular:     Rate and Rhythm: Normal rate and regular rhythm.     Pulses: Normal pulses.     Heart sounds: Normal heart sounds.  Pulmonary:     Effort: Pulmonary effort is normal.     Breath sounds: Normal breath sounds.  Chest:     Comments: PA-C in right upper chest without signs of infection.  Abdominal:     General: There is no distension.  Musculoskeletal:     Cervical back: Normal range of motion.  Skin:    General: Skin is warm and dry.  Neurological:     Mental Status: She is alert.        LABORATORY DATA I have reviewed the data as listed    Latest Ref Rng & Units 01/10/2024   10:39 AM 12/20/2023   10:14 AM 11/29/2023    9:44 AM  CBC  WBC 4.0 - 10.5 K/uL 6.4  5.6  6.8   Hemoglobin 12.0 - 15.0 g/dL 16.1  09.6  04.5   Hematocrit 36.0 - 46.0 % 42.0  41.8  41.2   Platelets 150 - 400 K/uL 168  212  180         Latest Ref Rng & Units 01/10/2024   10:39 AM 12/20/2023   10:14 AM 11/29/2023    9:44 AM  CMP  Glucose 70 - 99 mg/dL 91  92  92   BUN 8 - 23 mg/dL 11  8  9    Creatinine 0.44 - 1.00 mg/dL 4.09  8.11  9.14   Sodium 135 - 145 mmol/L 137  137  138   Potassium 3.5 - 5.1 mmol/L 4.1  3.9  4.1   Chloride 98 - 111 mmol/L 101  103  102   CO2 22 -  32 mmol/L 32  33  32   Calcium 8.9 - 10.3 mg/dL 9.3  9.3  9.1   Total Protein 6.5 - 8.1 g/dL 6.8  6.7  6.6   Total Bilirubin 0.0 - 1.2 mg/dL 0.8  0.6  0.8   Alkaline Phos 38 - 126 U/L 77  94  81   AST 15 - 41 U/L 15  18  16    ALT 0 - 44 U/L 10  11  10         RADIOGRAPHIC STUDIES (from last 24 hours if applicable) I have personally reviewed the radiological images as listed and agreed with the findings in the report. No results found.      Visit Diagnosis: 1. Port-A-Cath in place   2. Cancer, metastatic to bone (  [name])   3. Malignant neoplasm of lower-inner quadrant of left breast in female, estrogen receptor positive ([name])   4. Neuropathic pain      No orders of the defined types were placed in this encounter.   All questions were answered. The patient knows to call the clinic with any problems, questions or concerns. No barriers to learning was detected.  A total of more than 20 minutes were spent on this encounter with face-to-face time and non-face-to-face time, including preparing to see the patient, ordering tests and/or medications, counseling the patient and coordination of care as outlined above.    Thank you for allowing me to participate in the care of this patient.    Zuriah Bordas E  Walisiewicz, PA-C Department of Hematology/Oncology [facility] at [facility] Phone: [phone]  [phone]    01/10/2024 12:56 PM

## 2024-01-10 ENCOUNTER — Inpatient Hospital Stay (HOSPITAL_BASED_OUTPATIENT_CLINIC_OR_DEPARTMENT_OTHER): Admitting: Nurse Practitioner

## 2024-01-10 ENCOUNTER — Inpatient Hospital Stay

## 2024-01-10 ENCOUNTER — Encounter: Payer: Self-pay | Admitting: Nurse Practitioner

## 2024-01-10 ENCOUNTER — Inpatient Hospital Stay (HOSPITAL_BASED_OUTPATIENT_CLINIC_OR_DEPARTMENT_OTHER): Admitting: Physician Assistant

## 2024-01-10 ENCOUNTER — Telehealth: Payer: Self-pay

## 2024-01-10 VITALS — BP 130/78 | HR 79 | Temp 98.1°F | Resp 17 | Ht 62.0 in | Wt 76.2 lb

## 2024-01-10 DIAGNOSIS — C7951 Secondary malignant neoplasm of bone: Secondary | ICD-10-CM

## 2024-01-10 DIAGNOSIS — Z170 Estrogen receptor positive status [ER+]: Secondary | ICD-10-CM

## 2024-01-10 DIAGNOSIS — C50312 Malignant neoplasm of lower-inner quadrant of left female breast: Secondary | ICD-10-CM | POA: Diagnosis not present

## 2024-01-10 DIAGNOSIS — Z95828 Presence of other vascular implants and grafts: Secondary | ICD-10-CM

## 2024-01-10 DIAGNOSIS — Z515 Encounter for palliative care: Secondary | ICD-10-CM

## 2024-01-10 DIAGNOSIS — F17210 Nicotine dependence, cigarettes, uncomplicated: Secondary | ICD-10-CM | POA: Diagnosis not present

## 2024-01-10 DIAGNOSIS — G893 Neoplasm related pain (acute) (chronic): Secondary | ICD-10-CM | POA: Diagnosis not present

## 2024-01-10 DIAGNOSIS — Z79899 Other long term (current) drug therapy: Secondary | ICD-10-CM | POA: Diagnosis not present

## 2024-01-10 DIAGNOSIS — C787 Secondary malignant neoplasm of liver and intrahepatic bile duct: Secondary | ICD-10-CM | POA: Diagnosis not present

## 2024-01-10 DIAGNOSIS — Z5112 Encounter for antineoplastic immunotherapy: Secondary | ICD-10-CM | POA: Diagnosis not present

## 2024-01-10 DIAGNOSIS — R530 Neoplastic (malignant) related fatigue: Secondary | ICD-10-CM

## 2024-01-10 DIAGNOSIS — M792 Neuralgia and neuritis, unspecified: Secondary | ICD-10-CM

## 2024-01-10 DIAGNOSIS — C7931 Secondary malignant neoplasm of brain: Secondary | ICD-10-CM | POA: Diagnosis not present

## 2024-01-10 LAB — CBC WITH DIFFERENTIAL (CANCER CENTER ONLY)
Abs Immature Granulocytes: 0.02 10*3/uL (ref 0.00–0.07)
Basophils Absolute: 0 10*3/uL (ref 0.0–0.1)
Basophils Relative: 1 %
Eosinophils Absolute: 0.1 10*3/uL (ref 0.0–0.5)
Eosinophils Relative: 1 %
HCT: 42 % (ref 36.0–46.0)
Hemoglobin: 14.9 g/dL (ref 12.0–15.0)
Immature Granulocytes: 0 %
Lymphocytes Relative: 19 %
Lymphs Abs: 1.2 10*3/uL (ref 0.7–4.0)
MCH: 35.4 pg — ABNORMAL HIGH (ref 26.0–34.0)
MCHC: 35.5 g/dL (ref 30.0–36.0)
MCV: 99.8 fL (ref 80.0–100.0)
Monocytes Absolute: 0.5 10*3/uL (ref 0.1–1.0)
Monocytes Relative: 8 %
Neutro Abs: 4.5 10*3/uL (ref 1.7–7.7)
Neutrophils Relative %: 71 %
Platelet Count: 168 10*3/uL (ref 150–400)
RBC: 4.21 MIL/uL (ref 3.87–5.11)
RDW: 14.8 % (ref 11.5–15.5)
WBC Count: 6.4 10*3/uL (ref 4.0–10.5)
nRBC: 0 % (ref 0.0–0.2)

## 2024-01-10 LAB — CMP (CANCER CENTER ONLY)
ALT: 10 U/L (ref 0–44)
AST: 15 U/L (ref 15–41)
Albumin: 4.5 g/dL (ref 3.5–5.0)
Alkaline Phosphatase: 77 U/L (ref 38–126)
Anion gap: 4 — ABNORMAL LOW (ref 5–15)
BUN: 11 mg/dL (ref 8–23)
CO2: 32 mmol/L (ref 22–32)
Calcium: 9.3 mg/dL (ref 8.9–10.3)
Chloride: 101 mmol/L (ref 98–111)
Creatinine: 0.52 mg/dL (ref 0.44–1.00)
GFR, Estimated: >60 mL/min (ref >60)
Glucose, Bld: 91 mg/dL (ref 70–99)
Potassium: 4.1 mmol/L (ref 3.5–5.1)
Sodium: 137 mmol/L (ref 135–145)
Total Bilirubin: 0.8 mg/dL (ref 0.0–1.2)
Total Protein: 6.8 g/dL (ref 6.5–8.1)

## 2024-01-10 MED ORDER — TRASTUZUMAB-ANNS CHEMO 150 MG IV SOLR
6.0000 mg/kg | Freq: Once | INTRAVENOUS | Status: AC
  Administered 2024-01-10: 210 mg via INTRAVENOUS
  Filled 2024-01-10: qty 10

## 2024-01-10 MED ORDER — SODIUM CHLORIDE 0.9% FLUSH
10.0000 mL | Freq: Once | INTRAVENOUS | Status: AC
  Administered 2024-01-10: 10 mL

## 2024-01-10 MED ORDER — SODIUM CHLORIDE 0.9% FLUSH
10.0000 mL | INTRAVENOUS | Status: DC | PRN
  Administered 2024-01-10: 10 mL

## 2024-01-10 MED ORDER — SODIUM CHLORIDE 0.9 % IV SOLN: Freq: Once | INTRAVENOUS | Status: AC

## 2024-01-10 MED ORDER — PREGABALIN 25 MG PO CAPS: 25.0000 mg | ORAL_CAPSULE | Freq: Three times a day (TID) | ORAL | 3 refills | Status: DC

## 2024-01-10 MED ORDER — HEPARIN SOD (PORK) LOCK FLUSH 100 UNIT/ML IV SOLN
500.0000 [IU] | Freq: Once | INTRAVENOUS | Status: AC | PRN
  Administered 2024-01-10: 500 [IU]

## 2024-01-10 MED ORDER — ACETAMINOPHEN 325 MG PO TABS
650.0000 mg | ORAL_TABLET | Freq: Once | ORAL | Status: AC
  Administered 2024-01-10: 650 mg via ORAL
  Filled 2024-01-10: qty 2

## 2024-01-10 MED ORDER — DIPHENHYDRAMINE HCL 25 MG PO CAPS
50.0000 mg | ORAL_CAPSULE | Freq: Once | ORAL | Status: AC
  Administered 2024-01-10: 50 mg via ORAL
  Filled 2024-01-10: qty 2

## 2024-01-10 NOTE — Telephone Encounter (Signed)
 LVM for Mr. Martha [name] regarding his FMLA forms on behalf of his wife. FMLA form are completed,faxed and confirmation received.

## 2024-01-10 NOTE — Patient Instructions (Signed)
 [facility] - A DEPT OF [facility]  Discharge Instructions: Thank you for choosing [facility] to provide your oncology and hematology care.   If you have a lab appointment with the Cancer Center, please go directly to the Cancer Center and check in at the registration area.   Wear comfortable clothing and clothing appropriate for easy access to any Portacath or PICC line.   We strive to give you quality time with your provider. You may need to reschedule your appointment if you arrive late (15 or more minutes).  Arriving late affects you and other patients whose appointments are after yours.  Also, if you miss three or more appointments without notifying the office, you may be dismissed from the clinic at the provider's discretion.      For prescription refill requests, have your pharmacy contact our office and allow 72 hours for refills to be completed.    Today you received the following chemotherapy and/or immunotherapy agents: Kanjinti      To help prevent nausea and vomiting after your treatment, we encourage you to take your nausea medication as directed.  BELOW ARE SYMPTOMS THAT SHOULD BE REPORTED IMMEDIATELY: *FEVER GREATER THAN 100.4 F (38 C) OR HIGHER *CHILLS OR SWEATING *NAUSEA AND VOMITING THAT IS NOT CONTROLLED WITH YOUR NAUSEA MEDICATION *UNUSUAL SHORTNESS OF BREATH *UNUSUAL BRUISING OR BLEEDING *URINARY PROBLEMS (pain or burning when urinating, or frequent urination) *BOWEL PROBLEMS (unusual diarrhea, constipation, pain near the anus) TENDERNESS IN MOUTH AND THROAT WITH OR WITHOUT PRESENCE OF ULCERS (sore throat, sores in mouth, or a toothache) UNUSUAL RASH, SWELLING OR PAIN  UNUSUAL VAGINAL DISCHARGE OR ITCHING   Items with * indicate a potential emergency and should be followed up as soon as possible or go to the Emergency Department if any problems should occur.  Please show the CHEMOTHERAPY ALERT CARD or IMMUNOTHERAPY  ALERT CARD at check-in to the Emergency Department and triage nurse.  Should you have questions after your visit or need to cancel or reschedule your appointment, please contact [facility] - A DEPT OF [facility]  Dept: [phone]  and follow the prompts.  Office hours are 8:00 a.m. to 4:30 p.m. Monday - Friday. Please note that voicemails left after 4:00 p.m. may not be returned until the following business day.  We are closed weekends and major holidays. You have access to a nurse at all times for urgent questions. Please call the main number to the clinic Dept: [phone] and follow the prompts.   For any non-urgent questions, you may also contact your provider using MyChart. We now offer e-Visits for anyone 60 and older to request care online for non-urgent symptoms. For details visit [email or web address].   Also download the MyChart app! Go to the app store, search "MyChart", open the app, select [facility], and log in with your MyChart username and password.

## 2024-01-10 NOTE — Progress Notes (Signed)
 Palliative Medicine [facility]  Telephone:(336) [phone] [phone]) [phone]   Name: Martha [name] Martha [name] [name] Date: 01/10/2024 MRN: [id number]  DOB: 12/26/1960  Martha [name] [name] Care Team: Martha [name] Clamp, MD as PCP - General (Family Medicine) Martha [name] Rima, MD as Consulting Physician (General Surgery) Martha Kearney, MD as Consulting Physician (Hematology) Martha Dawes, MD as Attending Physician (Radiation Oncology) Martha [name] [name], Martha K, NP as Nurse Practitioner (Nurse Practitioner)    INTERVAL HISTORY: Martha [name] Martha [name] [name] is a 63 y.o. female with oncologic medical history including ER+ breast cancer (01/2018), right breast cancer (04/2021), now with metastatic disease progression involving brain and liver, pathological fractures with osseous mets s/p brain radiation. .  Palliative ask to see for symptom management and goals of care.   SOCIAL HISTORY:     reports that she has been smoking cigarettes. She has a 20 pack-year smoking history. She has never used smokeless tobacco. She reports that she does not currently use alcohol after a past usage of about 28.0 standard drinks of alcohol per week. She reports that she does not use drugs.  ADVANCE DIRECTIVES:  On file   CODE STATUS: DNR  PAST MEDICAL HISTORY: Past Medical History:  Diagnosis Date   Anxiety    Cancer ([name]) 2022   right breast LCIS   Cancer ([name]) 2019   left breast   Depression    Dysrhythmia    SVT, s/p ablation ~ 2012 in at [facility]   Ectopic pregnancy    Family history of breast cancer    Family history of melanoma    History of radiation therapy 04/22/18-06/03/18   Left Breast, left SCV, axilla 50 Gy in 25 fractions, Left breast boost 10 Gy in 5 fractions.    Hypothyroidism    Personal history of radiation therapy    PONV (postoperative nausea and vomiting)    Thyroid  disease     ALLERGIES:  is allergic to morphine and codeine.  MEDICATIONS:  Current Outpatient Medications  Medication  Sig Dispense Refill   acetaminophen  (TYLENOL ) 500 MG tablet Take 2 tablets (1,000 mg total) by mouth every 8 (eight) hours. 30 tablet 0   capecitabine  (XELODA ) 500 MG tablet TAKE 2 TABLETS TWICE DAILY FOR 7 DAYS ON THEN 7 DAYS OFF. TAKE AFTER MEALS 56 tablet 2   citalopram  (CELEXA ) 40 MG tablet TAKE 1 TABLET BY MOUTH EVERY [name] 90 tablet 2   diphenoxylate -atropine  (LOMOTIL ) 2.5-0.025 MG tablet Take 1 tablet by mouth 4 (four) times daily as needed for diarrhea or loose stools. 90 tablet 3   levothyroxine  (SYNTHROID ) 75 MCG tablet Take 1 tablet (75 mcg total) by mouth daily before breakfast. 90 tablet 3   loperamide  (IMODIUM  A-D) 2 MG tablet Take 1 tablet (2 mg total) by mouth 4 (four) times daily as needed for diarrhea or loose stools. 90 tablet 0   Mouthwashes (MOUTH RINSE) LIQD solution 15 mLs by Mouth Rinse route as needed (for oral care).  0   nicotine  (NICODERM CQ  - DOSED IN MG/24 HR) 7 mg/24hr patch Place 1 patch (7 mg total) onto Martha [name] Martha [name] [name] daily. 28 patch 0   ondansetron  (ZOFRAN ) 8 MG tablet Take 1 tablet (8 mg total) by mouth every 8 (eight) hours as needed for nausea or vomiting. 20 tablet 1   OVER Martha [name] COUNTER MEDICATION Take 1 tablet by mouth daily. Protandim Supplement     tucatinib  (TUKYSA ) 150 MG tablet Take 2 tablets (300 mg total) by mouth 2 (two) times daily. Take as  instructed per MD. 120 tablet 2   pregabalin  (LYRICA ) 25 MG capsule Take 1 capsule (25 mg total) by mouth 3 (three) times daily. 90 capsule 3   No current facility-administered medications for this visit.    VITAL SIGNS: There were no vitals taken for this visit. There were no vitals filed for this visit.  Estimated body mass index is 13.94 kg/m as calculated from Martha [name] following:   Height as of an earlier encounter on 01/10/24: 5\' 2"  (1.575 m).   Weight as of an earlier encounter on 01/10/24: 76 lb 3.2 oz (34.6 kg).   PERFORMANCE STATUS (ECOG) : 1 - Symptomatic but completely ambulatory   Physical Exam General:  NAD Cardiovascular: regular rate and rhythm Pulmonary: normal breathing pattern Extremities: no edema, no joint deformities [name]: no rashes Neurological: AAO x3  IMPRESSION: Discussed Martha [name] use of AI scribe software for clinical note transcription with Martha [name] Martha [name] [name], who gave verbal consent to proceed.  History of Present Illness Martha [name] Martha [name] [name] "Martha [name]" is a 63 year old female who presents for symptom management follow-up. No acute distress. She is accompanied by her husband. Denies concerns with nausea, vomiting, constipation, or diarrhea. Occasional fatigue however remains as active as possible.   She is scheduled to begin whole brain radiation therapy next week, which will last for fifteen days with a break for Martha [name] Martha [name] [name]. She has completed Martha [name] necessary CT and mask fitting. She describes this upcoming treatment as potentially Martha [name] easiest she has had, noting it will last about eight minutes per session and does not require changing clothes. Martha [name] [name] is realistic in her understanding and continues to remain hopeful.   She has been managing neuropathy with pregabalin , previously taking 50 mg twice a [name]. Feels symptoms have increased over Martha [name] past several weeks. We discussed increasing pregabalin  to 50 mg three times daily.   Overall Martha [name] feels well and continues to take things one [name] at a times.   I discussed Martha [name] importance of continued conversation with family and their medical providers regarding overall plan of care and treatment options, ensuring decisions are within Martha [name] context of Martha [name] patients values and GOCs. Assessment & Plan Radiation therapy for brain cancer Radiation therapy scheduled for fifteen days with [name] break. Mask and CT preparation completed. - Commence radiation therapy next week. - Include [name] break in schedule. - Complete therapy in fifteen days with eight-minute sessions.  Neuropathy Pregabalin  dosage increased due to insufficient relief. -  Adjust pregabalin  dosage to one capsule (50mg ) three times a [name]. - Notify pharmacy of dosage change.  I will see Martha [name] [name] back in 3-4 weeks. Sooner if needed.   Martha [name] [name] expressed understanding and was in agreement with this plan. She also understands that She can call Martha [name] clinic at any time with any questions, concerns, or complaints.   Any controlled substances utilized were prescribed in Martha [name] context of palliative care. PDMP has been reviewed.   Visit consisted of counseling and education dealing with Martha [name] complex and emotionally intense issues of symptom management and palliative care in Martha [name] setting of serious and potentially life-threatening illness.  Dellia Ferguson, AGPCNP-BC  Palliative Medicine Team/[facility]

## 2024-01-14 ENCOUNTER — Other Ambulatory Visit: Payer: Self-pay

## 2024-01-14 ENCOUNTER — Ambulatory Visit
Admission: RE | Admit: 2024-01-14 | Discharge: 2024-01-14 | Disposition: A | Discharge status: Home / Self Care | Source: Ambulatory Visit | Attending: Radiation Oncology | Admitting: Radiation Oncology

## 2024-01-14 DIAGNOSIS — Z510 Encounter for antineoplastic radiation therapy: Secondary | ICD-10-CM | POA: Diagnosis not present

## 2024-01-14 DIAGNOSIS — C7931 Secondary malignant neoplasm of brain: Secondary | ICD-10-CM | POA: Diagnosis not present

## 2024-01-14 LAB — RAD ONC ARIA SESSION SUMMARY
Course Elapsed Days: 0
Plan Fractions Treated to Date: 1
Plan Prescribed Dose Per Fraction: 2.5 Gy
Plan Total Fractions Prescribed: 14
Plan Total Prescribed Dose: 35 Gy
Reference Point Dosage Given to Date: 2.5 Gy
Reference Point Session Dosage Given: 2.5 Gy
Session Number: 1

## 2024-01-14 MED ORDER — SONAFINE EX EMUL
1.0000 | Freq: Two times a day (BID) | CUTANEOUS | Status: DC
  Administered 2024-01-14: 1 via TOPICAL

## 2024-01-15 ENCOUNTER — Ambulatory Visit
Admission: RE | Admit: 2024-01-15 | Discharge: 2024-01-15 | Disposition: A | Discharge status: Home / Self Care | Source: Ambulatory Visit | Attending: Radiation Oncology | Admitting: Radiation Oncology

## 2024-01-15 ENCOUNTER — Telehealth: Payer: Self-pay

## 2024-01-15 ENCOUNTER — Other Ambulatory Visit: Payer: Self-pay

## 2024-01-15 DIAGNOSIS — C7931 Secondary malignant neoplasm of brain: Secondary | ICD-10-CM | POA: Diagnosis not present

## 2024-01-15 DIAGNOSIS — Z510 Encounter for antineoplastic radiation therapy: Secondary | ICD-10-CM | POA: Diagnosis not present

## 2024-01-15 LAB — RAD ONC ARIA SESSION SUMMARY
Course Elapsed Days: 1
Plan Fractions Treated to Date: 2
Plan Prescribed Dose Per Fraction: 2.5 Gy
Plan Total Fractions Prescribed: 14
Plan Total Prescribed Dose: 35 Gy
Reference Point Dosage Given to Date: 5 Gy
Reference Point Session Dosage Given: 2.5 Gy
Session Number: 2

## 2024-01-15 NOTE — Telephone Encounter (Signed)
 Notified the pt regarding her husband completed FMLA forms. Pt state that she will pick them up on tomorrow 01/16/2024 after her appointment. No questions or concerns at this time.

## 2024-01-16 ENCOUNTER — Ambulatory Visit
Admission: RE | Admit: 2024-01-16 | Discharge: 2024-01-16 | Disposition: A | Discharge status: Home / Self Care | Source: Ambulatory Visit | Attending: Radiation Oncology | Admitting: Radiation Oncology

## 2024-01-16 ENCOUNTER — Telehealth: Payer: Self-pay

## 2024-01-16 ENCOUNTER — Other Ambulatory Visit: Payer: Self-pay

## 2024-01-16 DIAGNOSIS — C7931 Secondary malignant neoplasm of brain: Secondary | ICD-10-CM | POA: Diagnosis not present

## 2024-01-16 DIAGNOSIS — Z510 Encounter for antineoplastic radiation therapy: Secondary | ICD-10-CM | POA: Diagnosis not present

## 2024-01-16 LAB — RAD ONC ARIA SESSION SUMMARY
Course Elapsed Days: 2
Plan Fractions Treated to Date: 3
Plan Prescribed Dose Per Fraction: 2.5 Gy
Plan Total Fractions Prescribed: 14
Plan Total Prescribed Dose: 35 Gy
Reference Point Dosage Given to Date: 7.5 Gy
Reference Point Session Dosage Given: 2.5 Gy
Session Number: 3

## 2024-01-16 NOTE — Telephone Encounter (Signed)
 Notified the patient regarding her husband FMLA paper are ready for pick up. Pt verbalized understanding. She willpick up tomorrow after her appointment 01/17/2024.

## 2024-01-17 ENCOUNTER — Ambulatory Visit
Admission: RE | Admit: 2024-01-17 | Discharge: 2024-01-17 | Disposition: A | Discharge status: Home / Self Care | Source: Ambulatory Visit | Attending: Radiation Oncology | Admitting: Radiation Oncology

## 2024-01-17 ENCOUNTER — Other Ambulatory Visit: Payer: Self-pay

## 2024-01-17 DIAGNOSIS — Z510 Encounter for antineoplastic radiation therapy: Secondary | ICD-10-CM | POA: Diagnosis not present

## 2024-01-17 DIAGNOSIS — C7931 Secondary malignant neoplasm of brain: Secondary | ICD-10-CM | POA: Diagnosis not present

## 2024-01-17 LAB — RAD ONC ARIA SESSION SUMMARY
Course Elapsed Days: 3
Plan Fractions Treated to Date: 4
Plan Prescribed Dose Per Fraction: 2.5 Gy
Plan Total Fractions Prescribed: 14
Plan Total Prescribed Dose: 35 Gy
Reference Point Dosage Given to Date: 10 Gy
Reference Point Session Dosage Given: 2.5 Gy
Session Number: 4

## 2024-01-18 ENCOUNTER — Other Ambulatory Visit: Payer: Self-pay

## 2024-01-18 ENCOUNTER — Ambulatory Visit
Admission: RE | Admit: 2024-01-18 | Discharge: 2024-01-18 | Disposition: A | Discharge status: Home / Self Care | Source: Ambulatory Visit | Attending: Radiation Oncology | Admitting: Radiation Oncology

## 2024-01-18 DIAGNOSIS — Z170 Estrogen receptor positive status [ER+]: Secondary | ICD-10-CM | POA: Diagnosis not present

## 2024-01-18 DIAGNOSIS — C7931 Secondary malignant neoplasm of brain: Secondary | ICD-10-CM | POA: Diagnosis not present

## 2024-01-18 DIAGNOSIS — C50312 Malignant neoplasm of lower-inner quadrant of left female breast: Secondary | ICD-10-CM | POA: Diagnosis not present

## 2024-01-18 DIAGNOSIS — Z510 Encounter for antineoplastic radiation therapy: Secondary | ICD-10-CM | POA: Diagnosis not present

## 2024-01-18 LAB — RAD ONC ARIA SESSION SUMMARY
Course Elapsed Days: 4
Plan Fractions Treated to Date: 5
Plan Prescribed Dose Per Fraction: 2.5 Gy
Plan Total Fractions Prescribed: 14
Plan Total Prescribed Dose: 35 Gy
Reference Point Dosage Given to Date: 12.5 Gy
Reference Point Session Dosage Given: 2.5 Gy
Session Number: 5

## 2024-01-22 ENCOUNTER — Other Ambulatory Visit: Payer: Self-pay

## 2024-01-22 ENCOUNTER — Ambulatory Visit
Admission: RE | Admit: 2024-01-22 | Discharge: 2024-01-22 | Disposition: A | Discharge status: Home / Self Care | Source: Ambulatory Visit | Attending: Radiation Oncology

## 2024-01-22 DIAGNOSIS — Z510 Encounter for antineoplastic radiation therapy: Secondary | ICD-10-CM | POA: Diagnosis not present

## 2024-01-22 DIAGNOSIS — C7931 Secondary malignant neoplasm of brain: Secondary | ICD-10-CM | POA: Diagnosis not present

## 2024-01-22 LAB — RAD ONC ARIA SESSION SUMMARY
Course Elapsed Days: 8
Plan Fractions Treated to Date: 6
Plan Prescribed Dose Per Fraction: 2.5 Gy
Plan Total Fractions Prescribed: 14
Plan Total Prescribed Dose: 35 Gy
Reference Point Dosage Given to Date: 15 Gy
Reference Point Session Dosage Given: 2.5 Gy
Session Number: 6

## 2024-01-23 ENCOUNTER — Other Ambulatory Visit: Payer: Self-pay

## 2024-01-23 ENCOUNTER — Ambulatory Visit
Admission: RE | Admit: 2024-01-23 | Discharge: 2024-01-23 | Disposition: A | Discharge status: Home / Self Care | Source: Ambulatory Visit | Attending: Radiation Oncology

## 2024-01-23 DIAGNOSIS — Z510 Encounter for antineoplastic radiation therapy: Secondary | ICD-10-CM | POA: Diagnosis not present

## 2024-01-23 DIAGNOSIS — C7931 Secondary malignant neoplasm of brain: Secondary | ICD-10-CM | POA: Diagnosis not present

## 2024-01-23 LAB — RAD ONC ARIA SESSION SUMMARY
Course Elapsed Days: 9
Plan Fractions Treated to Date: 7
Plan Prescribed Dose Per Fraction: 2.5 Gy
Plan Total Fractions Prescribed: 14
Plan Total Prescribed Dose: 35 Gy
Reference Point Dosage Given to Date: 17.5 Gy
Reference Point Session Dosage Given: 2.5 Gy
Session Number: 7

## 2024-01-24 ENCOUNTER — Inpatient Hospital Stay (HOSPITAL_BASED_OUTPATIENT_CLINIC_OR_DEPARTMENT_OTHER): Admitting: Nurse Practitioner

## 2024-01-24 ENCOUNTER — Encounter: Payer: Self-pay | Admitting: Nurse Practitioner

## 2024-01-24 ENCOUNTER — Ambulatory Visit
Admission: RE | Admit: 2024-01-24 | Discharge: 2024-01-24 | Disposition: A | Discharge status: Home / Self Care | Source: Ambulatory Visit | Attending: Radiation Oncology

## 2024-01-24 ENCOUNTER — Other Ambulatory Visit: Payer: Self-pay

## 2024-01-24 VITALS — BP 91/62 | HR 89 | Temp 97.9°F | Resp 18 | Wt 74.9 lb

## 2024-01-24 DIAGNOSIS — R530 Neoplastic (malignant) related fatigue: Secondary | ICD-10-CM

## 2024-01-24 DIAGNOSIS — G893 Neoplasm related pain (acute) (chronic): Secondary | ICD-10-CM | POA: Diagnosis not present

## 2024-01-24 DIAGNOSIS — Z515 Encounter for palliative care: Secondary | ICD-10-CM

## 2024-01-24 DIAGNOSIS — Z510 Encounter for antineoplastic radiation therapy: Secondary | ICD-10-CM | POA: Diagnosis not present

## 2024-01-24 DIAGNOSIS — Z170 Estrogen receptor positive status [ER+]: Secondary | ICD-10-CM

## 2024-01-24 DIAGNOSIS — Z7189 Other specified counseling: Secondary | ICD-10-CM

## 2024-01-24 DIAGNOSIS — C50312 Malignant neoplasm of lower-inner quadrant of left female breast: Secondary | ICD-10-CM

## 2024-01-24 DIAGNOSIS — C7931 Secondary malignant neoplasm of brain: Secondary | ICD-10-CM | POA: Diagnosis not present

## 2024-01-24 DIAGNOSIS — C7951 Secondary malignant neoplasm of bone: Secondary | ICD-10-CM

## 2024-01-24 LAB — RAD ONC ARIA SESSION SUMMARY
Course Elapsed Days: 10
Plan Fractions Treated to Date: 8
Plan Prescribed Dose Per Fraction: 2.5 Gy
Plan Total Fractions Prescribed: 14
Plan Total Prescribed Dose: 35 Gy
Reference Point Dosage Given to Date: 20 Gy
Reference Point Session Dosage Given: 2.5 Gy
Session Number: 8

## 2024-01-24 NOTE — Progress Notes (Signed)
 Palliative Medicine [facility]  Telephone:(336) [phone] Fax:(336) [phone]   Name: Martha [name] Date: 01/24/2024 MRN: [id number]  DOB: 06/12/1961  Patient Care Team: Rodney Clamp, MD as PCP - General (Family Medicine) Lockie Rima, MD as Consulting Physician (General Surgery) Sonja [place], MD as Consulting Physician (Hematology) Colie Dawes, MD as Attending Physician (Radiation Oncology) [name], [name], NP as Nurse Practitioner (Nurse Practitioner)    INTERVAL HISTORY: Martha [name] is a 63 y.o. female with oncologic medical history including ER+ breast cancer (01/2018), right breast cancer (04/2021), now with metastatic disease progression involving brain and liver, pathological fractures with osseous mets s/p brain radiation. .  Palliative ask to see for symptom management and goals of care.   SOCIAL HISTORY:     reports that she has been smoking cigarettes. She has a 20 pack-year smoking history. She has never used smokeless tobacco. She reports that she does not currently use alcohol after a past usage of about 28.0 standard drinks of alcohol per week. She reports that she does not use drugs.  ADVANCE DIRECTIVES:  On file   CODE STATUS: DNR  PAST MEDICAL HISTORY: Past Medical History:  Diagnosis Date   Anxiety    Cancer ([name]) 2022   right breast LCIS   Cancer ([name]) 2019   left breast   Depression    Dysrhythmia    SVT, s/p ablation ~ 2012 in at [facility]   Ectopic pregnancy    Family history of breast cancer    Family history of melanoma    History of radiation therapy 04/22/18-06/03/18   Left Breast, left SCV, axilla 50 Gy in 25 fractions, Left breast boost 10 Gy in 5 fractions.    Hypothyroidism    Personal history of radiation therapy    PONV (postoperative nausea and vomiting)    Thyroid  disease     ALLERGIES:  is allergic to morphine and codeine.  MEDICATIONS:  Current Outpatient Medications  Medication  Sig Dispense Refill   acetaminophen  (TYLENOL ) 500 MG tablet Take 2 tablets (1,000 mg total) by mouth every 8 (eight) hours. 30 tablet 0   capecitabine  (XELODA ) 500 MG tablet TAKE 2 TABLETS TWICE DAILY FOR 7 DAYS ON THEN 7 DAYS OFF. TAKE AFTER MEALS 56 tablet 2   citalopram  (CELEXA ) 40 MG tablet TAKE 1 TABLET BY MOUTH EVERY DAY 90 tablet 2   diphenoxylate -atropine  (LOMOTIL ) 2.5-0.025 MG tablet Take 1 tablet by mouth 4 (four) times daily as needed for diarrhea or loose stools. 90 tablet 3   levothyroxine  (SYNTHROID ) 75 MCG tablet Take 1 tablet (75 mcg total) by mouth daily before breakfast. 90 tablet 3   loperamide  (IMODIUM  A-D) 2 MG tablet Take 1 tablet (2 mg total) by mouth 4 (four) times daily as needed for diarrhea or loose stools. 90 tablet 0   Mouthwashes (MOUTH RINSE) LIQD solution 15 mLs by Mouth Rinse route as needed (for oral care).  0   nicotine  (NICODERM CQ  - DOSED IN MG/24 HR) 7 mg/24hr patch Place 1 patch (7 mg total) onto the skin daily. 28 patch 0   ondansetron  (ZOFRAN ) 8 MG tablet Take 1 tablet (8 mg total) by mouth every 8 (eight) hours as needed for nausea or vomiting. 20 tablet 1   OVER THE COUNTER MEDICATION Take 1 tablet by mouth daily. Protandim Supplement     pregabalin  (LYRICA ) 25 MG capsule Take 1 capsule (25 mg total) by mouth 3 (three) times daily. 90 capsule  3   tucatinib  (TUKYSA ) 150 MG tablet Take 2 tablets (300 mg total) by mouth 2 (two) times daily. Take as instructed per MD. 120 tablet 2   No current facility-administered medications for this visit.    VITAL SIGNS: BP 91/62   Pulse 89   Temp 97.9 F (36.6 C)   Resp 18   Wt 74 lb 14.4 oz (34 kg)   SpO2 96%   BMI 13.70 kg/m  Filed Weights   01/24/24 0839  Weight: 74 lb 14.4 oz (34 kg)    Estimated body mass index is 13.7 kg/m as calculated from the following:   Height as of 01/10/24: 5\' 2"  (1.575 m).   Weight as of this encounter: 74 lb 14.4 oz (34 kg).   PERFORMANCE STATUS (ECOG) : 1 - Symptomatic  but completely ambulatory   Physical Exam General: NAD Cardiovascular: regular rate and rhythm Pulmonary: normal breathing pattern Extremities: no edema, no joint deformities Skin: no rashes Neurological: AAO x3  IMPRESSION: Discussed the use of AI scribe software for clinical note transcription with the patient, who gave verbal consent to proceed.  History of Present Illness Martha [name] "Martha [name]" is a 63 year old female undergoing whole brain radiation therapy who presents for follow-up. She is tolerating treatment without difficulty. She expresses appreciation of increase in her independency. Martha [name] has been able to drive herself to her appointments over the past week. She has not been able to drive herself in over a year. Doing fine and enjoying ability to do so.   She is currently in her second week of whole brain radiation therapy and has experienced no side effects. Her appetite and energy levels remain good, and she does not require any medication refills at this time. Neuropathy controlled with pregablain. Patient tolerating without difficulty.   She has a persistent cough, which she attributes to pollen, and is unable to expectorate any mucus. She inquires about the use of Mucinex  to alleviate her symptoms.  Her sister has lung cancer and has contracted COVID-19 twice, in March and May. She acknowledges the impact of her condition on her caregiver in the setting of cancer, who has been significantly affected by the caregiving responsibilities. Emotional support provided as we discussed cancer care and interruptions in daily life as a whole.   All symptoms well managed. No adjustments at this time. All questions answered and support provided.   I discussed the importance of continued conversation with family and their medical providers regarding overall plan of care and treatment options, ensuring decisions are within the context of the patients values and GOCs. Assessment &  Plan  Neuropathy Pregabalin  dosing tolerated. Discomfort much improved. - Continue pregabalin  dosage to one capsule (50mg ) three times a day. - Notify pharmacy of dosage change.  Whole brain radiation therapy Undergoing treatment with stable energy levels and appetite, managing independently without side effects. - Continue whole brain radiation therapy. - Monitor for delayed side effects post-treatment.  Cough Mild cough likely due to pollen exposure, non-productive, no sputum color change. - Advise use of Mucinex . - Instruct to report if sputum changes to green or yellow.  Quality of Life QOL much improved. Decreased symptom burden. Patient able to drive herself to appointments which she has not been able to do for many months.   I will see patient back in 3-4 weeks. Sooner if needed.   Patient expressed understanding and was in agreement with this plan. She also understands that She can call the clinic  at any time with any questions, concerns, or complaints.   Any controlled substances utilized were prescribed in the context of palliative care. PDMP has been reviewed.   Visit consisted of counseling and education dealing with the complex and emotionally intense issues of symptom management and palliative care in the setting of serious and potentially life-threatening illness.  Dellia Ferguson, AGPCNP-BC  Palliative Medicine Team/[facility]

## 2024-01-25 ENCOUNTER — Other Ambulatory Visit: Payer: Self-pay

## 2024-01-25 ENCOUNTER — Ambulatory Visit
Admission: RE | Admit: 2024-01-25 | Discharge: 2024-01-25 | Disposition: A | Discharge status: Home / Self Care | Source: Ambulatory Visit | Attending: Radiation Oncology | Admitting: Radiation Oncology

## 2024-01-25 DIAGNOSIS — Z510 Encounter for antineoplastic radiation therapy: Secondary | ICD-10-CM | POA: Diagnosis not present

## 2024-01-25 DIAGNOSIS — C7931 Secondary malignant neoplasm of brain: Secondary | ICD-10-CM | POA: Diagnosis not present

## 2024-01-25 LAB — RAD ONC ARIA SESSION SUMMARY
Course Elapsed Days: 11
Plan Fractions Treated to Date: 9
Plan Prescribed Dose Per Fraction: 2.5 Gy
Plan Total Fractions Prescribed: 14
Plan Total Prescribed Dose: 35 Gy
Reference Point Dosage Given to Date: 22.5 Gy
Reference Point Session Dosage Given: 2.5 Gy
Session Number: 9

## 2024-01-28 ENCOUNTER — Ambulatory Visit
Admission: RE | Admit: 2024-01-28 | Discharge: 2024-01-28 | Disposition: A | Discharge status: Home / Self Care | Source: Ambulatory Visit | Attending: Radiation Oncology | Admitting: Radiation Oncology

## 2024-01-28 ENCOUNTER — Other Ambulatory Visit: Payer: Self-pay

## 2024-01-28 DIAGNOSIS — C50312 Malignant neoplasm of lower-inner quadrant of left female breast: Secondary | ICD-10-CM | POA: Insufficient documentation

## 2024-01-28 DIAGNOSIS — C7951 Secondary malignant neoplasm of bone: Secondary | ICD-10-CM | POA: Diagnosis not present

## 2024-01-28 DIAGNOSIS — Z510 Encounter for antineoplastic radiation therapy: Secondary | ICD-10-CM | POA: Diagnosis not present

## 2024-01-28 DIAGNOSIS — Z5112 Encounter for antineoplastic immunotherapy: Secondary | ICD-10-CM | POA: Diagnosis not present

## 2024-01-28 DIAGNOSIS — Z79899 Other long term (current) drug therapy: Secondary | ICD-10-CM | POA: Diagnosis not present

## 2024-01-28 DIAGNOSIS — C7931 Secondary malignant neoplasm of brain: Secondary | ICD-10-CM | POA: Insufficient documentation

## 2024-01-28 DIAGNOSIS — Z923 Personal history of irradiation: Secondary | ICD-10-CM | POA: Insufficient documentation

## 2024-01-28 DIAGNOSIS — Z170 Estrogen receptor positive status [ER+]: Secondary | ICD-10-CM | POA: Diagnosis not present

## 2024-01-28 LAB — RAD ONC ARIA SESSION SUMMARY
Course Elapsed Days: 14
Plan Fractions Treated to Date: 10
Plan Prescribed Dose Per Fraction: 2.5 Gy
Plan Total Fractions Prescribed: 14
Plan Total Prescribed Dose: 35 Gy
Reference Point Dosage Given to Date: 25 Gy
Reference Point Session Dosage Given: 2.5 Gy
Session Number: 10

## 2024-01-29 ENCOUNTER — Other Ambulatory Visit: Payer: Self-pay

## 2024-01-29 ENCOUNTER — Ambulatory Visit
Admission: RE | Admit: 2024-01-29 | Discharge: 2024-01-29 | Disposition: A | Discharge status: Home / Self Care | Source: Ambulatory Visit | Attending: Radiation Oncology

## 2024-01-29 DIAGNOSIS — Z170 Estrogen receptor positive status [ER+]: Secondary | ICD-10-CM | POA: Diagnosis not present

## 2024-01-29 DIAGNOSIS — Z923 Personal history of irradiation: Secondary | ICD-10-CM | POA: Diagnosis not present

## 2024-01-29 DIAGNOSIS — Z510 Encounter for antineoplastic radiation therapy: Secondary | ICD-10-CM | POA: Diagnosis not present

## 2024-01-29 DIAGNOSIS — C7951 Secondary malignant neoplasm of bone: Secondary | ICD-10-CM | POA: Diagnosis not present

## 2024-01-29 DIAGNOSIS — Z79899 Other long term (current) drug therapy: Secondary | ICD-10-CM | POA: Diagnosis not present

## 2024-01-29 DIAGNOSIS — C50312 Malignant neoplasm of lower-inner quadrant of left female breast: Secondary | ICD-10-CM | POA: Diagnosis not present

## 2024-01-29 DIAGNOSIS — Z5112 Encounter for antineoplastic immunotherapy: Secondary | ICD-10-CM | POA: Diagnosis not present

## 2024-01-29 DIAGNOSIS — C7931 Secondary malignant neoplasm of brain: Secondary | ICD-10-CM | POA: Diagnosis not present

## 2024-01-29 LAB — RAD ONC ARIA SESSION SUMMARY
Course Elapsed Days: 15
Plan Fractions Treated to Date: 11
Plan Prescribed Dose Per Fraction: 2.5 Gy
Plan Total Fractions Prescribed: 14
Plan Total Prescribed Dose: 35 Gy
Reference Point Dosage Given to Date: 27.5 Gy
Reference Point Session Dosage Given: 2.5 Gy
Session Number: 11

## 2024-01-30 ENCOUNTER — Ambulatory Visit
Admission: RE | Admit: 2024-01-30 | Discharge: 2024-01-30 | Disposition: A | Discharge status: Home / Self Care | Source: Ambulatory Visit | Attending: Radiation Oncology

## 2024-01-30 ENCOUNTER — Other Ambulatory Visit: Payer: Self-pay

## 2024-01-30 DIAGNOSIS — C7931 Secondary malignant neoplasm of brain: Secondary | ICD-10-CM | POA: Diagnosis not present

## 2024-01-30 DIAGNOSIS — Z170 Estrogen receptor positive status [ER+]: Secondary | ICD-10-CM | POA: Diagnosis not present

## 2024-01-30 DIAGNOSIS — C7951 Secondary malignant neoplasm of bone: Secondary | ICD-10-CM | POA: Diagnosis not present

## 2024-01-30 DIAGNOSIS — C50312 Malignant neoplasm of lower-inner quadrant of left female breast: Secondary | ICD-10-CM | POA: Diagnosis not present

## 2024-01-30 DIAGNOSIS — Z510 Encounter for antineoplastic radiation therapy: Secondary | ICD-10-CM | POA: Diagnosis not present

## 2024-01-30 DIAGNOSIS — Z79899 Other long term (current) drug therapy: Secondary | ICD-10-CM | POA: Diagnosis not present

## 2024-01-30 DIAGNOSIS — Z923 Personal history of irradiation: Secondary | ICD-10-CM | POA: Diagnosis not present

## 2024-01-30 DIAGNOSIS — Z5112 Encounter for antineoplastic immunotherapy: Secondary | ICD-10-CM | POA: Diagnosis not present

## 2024-01-30 LAB — RAD ONC ARIA SESSION SUMMARY
Course Elapsed Days: 16
Plan Fractions Treated to Date: 12
Plan Prescribed Dose Per Fraction: 2.5 Gy
Plan Total Fractions Prescribed: 14
Plan Total Prescribed Dose: 35 Gy
Reference Point Dosage Given to Date: 30 Gy
Reference Point Session Dosage Given: 2.5 Gy
Session Number: 12

## 2024-01-31 ENCOUNTER — Inpatient Hospital Stay (HOSPITAL_BASED_OUTPATIENT_CLINIC_OR_DEPARTMENT_OTHER): Admitting: Hematology

## 2024-01-31 ENCOUNTER — Inpatient Hospital Stay

## 2024-01-31 ENCOUNTER — Inpatient Hospital Stay: Attending: Genetic Counselor

## 2024-01-31 ENCOUNTER — Other Ambulatory Visit: Payer: Self-pay

## 2024-01-31 ENCOUNTER — Encounter: Payer: Self-pay | Admitting: Hematology

## 2024-01-31 ENCOUNTER — Ambulatory Visit
Admission: RE | Admit: 2024-01-31 | Discharge: 2024-01-31 | Disposition: A | Discharge status: Home / Self Care | Source: Ambulatory Visit | Attending: Radiation Oncology | Admitting: Radiation Oncology

## 2024-01-31 VITALS — BP 115/77 | HR 83 | Temp 98.3°F | Resp 15 | Ht 62.0 in | Wt 73.7 lb

## 2024-01-31 VITALS — BP 116/63 | HR 70 | Resp 14

## 2024-01-31 DIAGNOSIS — C7951 Secondary malignant neoplasm of bone: Secondary | ICD-10-CM | POA: Diagnosis not present

## 2024-01-31 DIAGNOSIS — Z95828 Presence of other vascular implants and grafts: Secondary | ICD-10-CM

## 2024-01-31 DIAGNOSIS — C50312 Malignant neoplasm of lower-inner quadrant of left female breast: Secondary | ICD-10-CM | POA: Diagnosis not present

## 2024-01-31 DIAGNOSIS — Z170 Estrogen receptor positive status [ER+]: Secondary | ICD-10-CM | POA: Diagnosis not present

## 2024-01-31 DIAGNOSIS — Z5112 Encounter for antineoplastic immunotherapy: Secondary | ICD-10-CM | POA: Diagnosis not present

## 2024-01-31 DIAGNOSIS — Z923 Personal history of irradiation: Secondary | ICD-10-CM | POA: Diagnosis not present

## 2024-01-31 DIAGNOSIS — C7931 Secondary malignant neoplasm of brain: Secondary | ICD-10-CM | POA: Diagnosis not present

## 2024-01-31 DIAGNOSIS — Z510 Encounter for antineoplastic radiation therapy: Secondary | ICD-10-CM | POA: Diagnosis not present

## 2024-01-31 DIAGNOSIS — Z79899 Other long term (current) drug therapy: Secondary | ICD-10-CM | POA: Diagnosis not present

## 2024-01-31 LAB — CMP (CANCER CENTER ONLY)
ALT: 29 U/L (ref 0–44)
AST: 20 U/L (ref 15–41)
Albumin: 4.6 g/dL (ref 3.5–5.0)
Alkaline Phosphatase: 109 U/L (ref 38–126)
Anion gap: 4 — ABNORMAL LOW (ref 5–15)
BUN: 11 mg/dL (ref 8–23)
CO2: 31 mmol/L (ref 22–32)
Calcium: 9.2 mg/dL (ref 8.9–10.3)
Chloride: 102 mmol/L (ref 98–111)
Creatinine: 0.56 mg/dL (ref 0.44–1.00)
GFR, Estimated: >60 mL/min (ref >60)
Glucose, Bld: 101 mg/dL — ABNORMAL HIGH (ref 70–99)
Potassium: 4.2 mmol/L (ref 3.5–5.1)
Sodium: 137 mmol/L (ref 135–145)
Total Bilirubin: 1 mg/dL (ref 0.0–1.2)
Total Protein: 7 g/dL (ref 6.5–8.1)

## 2024-01-31 LAB — RAD ONC ARIA SESSION SUMMARY
Course Elapsed Days: 17
Plan Fractions Treated to Date: 13
Plan Prescribed Dose Per Fraction: 2.5 Gy
Plan Total Fractions Prescribed: 14
Plan Total Prescribed Dose: 35 Gy
Reference Point Dosage Given to Date: 32.5 Gy
Reference Point Session Dosage Given: 2.5 Gy
Session Number: 13

## 2024-01-31 LAB — CBC WITH DIFFERENTIAL (CANCER CENTER ONLY)
Abs Immature Granulocytes: 0.03 10*3/uL (ref 0.00–0.07)
Basophils Absolute: 0.1 10*3/uL (ref 0.0–0.1)
Basophils Relative: 1 %
Eosinophils Absolute: 0.1 10*3/uL (ref 0.0–0.5)
Eosinophils Relative: 1 %
HCT: 43.4 % (ref 36.0–46.0)
Hemoglobin: 15 g/dL (ref 12.0–15.0)
Immature Granulocytes: 1 %
Lymphocytes Relative: 16 %
Lymphs Abs: 0.9 10*3/uL (ref 0.7–4.0)
MCH: 34.6 pg — ABNORMAL HIGH (ref 26.0–34.0)
MCHC: 34.6 g/dL (ref 30.0–36.0)
MCV: 100.2 fL — ABNORMAL HIGH (ref 80.0–100.0)
Monocytes Absolute: 0.5 10*3/uL (ref 0.1–1.0)
Monocytes Relative: 9 %
Neutro Abs: 3.8 10*3/uL (ref 1.7–7.7)
Neutrophils Relative %: 72 %
Platelet Count: 158 10*3/uL (ref 150–400)
RBC: 4.33 MIL/uL (ref 3.87–5.11)
RDW: 14.6 % (ref 11.5–15.5)
WBC Count: 5.3 10*3/uL (ref 4.0–10.5)
nRBC: 0 % (ref 0.0–0.2)

## 2024-01-31 MED ORDER — SODIUM CHLORIDE 0.9% FLUSH
10.0000 mL | INTRAVENOUS | Status: DC | PRN
  Administered 2024-01-31: 10 mL

## 2024-01-31 MED ORDER — ACETAMINOPHEN 325 MG PO TABS
650.0000 mg | ORAL_TABLET | Freq: Once | ORAL | Status: AC
  Administered 2024-01-31: 650 mg via ORAL
  Filled 2024-01-31: qty 2

## 2024-01-31 MED ORDER — CAPECITABINE 500 MG PO TABS: ORAL_TABLET | ORAL | 0 refills | Status: DC

## 2024-01-31 MED ORDER — TRASTUZUMAB-ANNS CHEMO 150 MG IV SOLR
6.0000 mg/kg | Freq: Once | INTRAVENOUS | Status: AC
  Administered 2024-01-31: 210 mg via INTRAVENOUS
  Filled 2024-01-31: qty 10

## 2024-01-31 MED ORDER — SODIUM CHLORIDE 0.9% FLUSH
10.0000 mL | Freq: Once | INTRAVENOUS | Status: AC
  Administered 2024-01-31: 10 mL

## 2024-01-31 MED ORDER — SODIUM CHLORIDE 0.9 % IV SOLN: Freq: Once | INTRAVENOUS | Status: AC

## 2024-01-31 MED ORDER — DIPHENHYDRAMINE HCL 25 MG PO CAPS
50.0000 mg | ORAL_CAPSULE | Freq: Once | ORAL | Status: AC
  Administered 2024-01-31: 50 mg via ORAL
  Filled 2024-01-31: qty 2

## 2024-01-31 MED ORDER — HEPARIN SOD (PORK) LOCK FLUSH 100 UNIT/ML IV SOLN
500.0000 [IU] | Freq: Once | INTRAVENOUS | Status: AC | PRN
  Administered 2024-01-31: 500 [IU]

## 2024-01-31 NOTE — Assessment & Plan Note (Addendum)
-  Diagnosed in 06/2022, ER-/PR-/HER2+ --PET scan from 08/03/2022 showed diffuse bone mets  -she underwent cervical laminectomy and fusion by Dr. Andy Bannister on 08/17/2022, biopsy confirmed metastatic breast cancer, ER/PR negative, HER2 positive. -due to cervical cord compression with myelopathy, she underwent a second cervical spine surgery on September 06, 2022, and completed inpatient rehabitation. -She has completed brain, cervical and lumbar spine radiation in Feb 2024 -She was recently hospitalized for altered mental status and confusion, probably secondary to medication -She started first line systemic chemotherapy with weekly Taxol  and trastuzumab /Perjeta  on 10/25/22. She has tolerated well so far and her pain has improved overall -PET scan from Jan 01, 2023 showed complete metabolic response, no hypermetabolic uptake in diffuse bone lesions, no other new lesions.  She is clinically also doing much better.  Given her excellent response to chemo, we will change her Taxol  to 2 weeks on and 1 week off -She Unfortunately developed more more brain metastasis on recent brain MRI. -I personally reviewed her restaging PET scan from April 13, 2023, which showed stable disease, no new lesions. -Due to her new brain metastasis, I changed her therapy to Tucatinib , capecitabine  and trastuzumab , for better brain penetration. She started in late August 2024, tolerating well so far.  Her case was reviewed in CNS tumor board, Dr. Lurena Sally plan to hold on whole brain radiation since she is starting new Her2 antibody and monitor her brain mets closely.  -restaging MRI brian 08/30/2023 showed partial response, PET in 06/2023 showed no hypermetabolic lesions  -brain MRI 12/27/2023 showed progressive brain mets, she started whole brain RT on 5/19 -she is also on Zometa  every 3 months

## 2024-01-31 NOTE — Patient Instructions (Signed)
 [facility] - A DEPT OF [facility]  Discharge Instructions: Thank you for choosing [facility] to provide your oncology and hematology care.   If you have a lab appointment with the Cancer Center, please go directly to the Cancer Center and check in at the registration area.   Wear comfortable clothing and clothing appropriate for easy access to any Portacath or PICC line.   We strive to give you quality time with your provider. You may need to reschedule your appointment if you arrive late (15 or more minutes).  Arriving late affects you and other patients whose appointments are after yours.  Also, if you miss three or more appointments without notifying the office, you may be dismissed from the clinic at the provider's discretion.      For prescription refill requests, have your pharmacy contact our office and allow 72 hours for refills to be completed.    Today you received the following chemotherapy and/or immunotherapy agents: Kanjinti      To help prevent nausea and vomiting after your treatment, we encourage you to take your nausea medication as directed.  BELOW ARE SYMPTOMS THAT SHOULD BE REPORTED IMMEDIATELY: *FEVER GREATER THAN 100.4 F (38 C) OR HIGHER *CHILLS OR SWEATING *NAUSEA AND VOMITING THAT IS NOT CONTROLLED WITH YOUR NAUSEA MEDICATION *UNUSUAL SHORTNESS OF BREATH *UNUSUAL BRUISING OR BLEEDING *URINARY PROBLEMS (pain or burning when urinating, or frequent urination) *BOWEL PROBLEMS (unusual diarrhea, constipation, pain near the anus) TENDERNESS IN MOUTH AND THROAT WITH OR WITHOUT PRESENCE OF ULCERS (sore throat, sores in mouth, or a toothache) UNUSUAL RASH, SWELLING OR PAIN  UNUSUAL VAGINAL DISCHARGE OR ITCHING   Items with * indicate a potential emergency and should be followed up as soon as possible or go to the Emergency Department if any problems should occur.  Please show the CHEMOTHERAPY ALERT CARD or IMMUNOTHERAPY  ALERT CARD at check-in to the Emergency Department and triage nurse.  Should you have questions after your visit or need to cancel or reschedule your appointment, please contact [facility] - A DEPT OF [facility]  Dept: [phone]  and follow the prompts.  Office hours are 8:00 a.m. to 4:30 p.m. Monday - Friday. Please note that voicemails left after 4:00 p.m. may not be returned until the following business day.  We are closed weekends and major holidays. You have access to a nurse at all times for urgent questions. Please call the main number to the clinic Dept: [phone] and follow the prompts.   For any non-urgent questions, you may also contact your provider using MyChart. We now offer e-Visits for anyone 60 and older to request care online for non-urgent symptoms. For details visit [email or web address].   Also download the MyChart app! Go to the app store, search "MyChart", open the app, select [facility], and log in with your MyChart username and password.

## 2024-01-31 NOTE — Progress Notes (Signed)
 [facility]   Telephone:([phone] Fax:(336) [phone]   Clinic Follow up Note   Patient Care Team: Rodney Clamp, MD as PCP - General (Family Medicine) Lockie Rima, MD as Consulting Physician (General Surgery) Sonja [place], MD as Consulting Physician (Hematology) Colie Dawes, MD as Attending Physician (Radiation Oncology) [name], [name], NP as Nurse Practitioner (Nurse Practitioner)  Date of Service:  01/31/2024  CHIEF COMPLAINT: f/u of metastatic breast cancer  CURRENT THERAPY:  Whole brain radiation Tucatinib  and trastuzumab , capecitabine  is on hold during her radiation  Oncology History   Metastatic breast cancer to bone and brain -Diagnosed in 06/2022, ER-/PR-/HER2+ --PET scan from 08/03/2022 showed diffuse bone mets  -she underwent cervical laminectomy and fusion by Dr. Andy Bannister on 08/17/2022, biopsy confirmed metastatic breast cancer, ER/PR negative, HER2 positive. -due to cervical cord compression with myelopathy, she underwent a second cervical spine surgery on September 06, 2022, and completed inpatient rehabitation. -She has completed brain, cervical and lumbar spine radiation in Feb 2024 -She was recently hospitalized for altered mental status and confusion, probably secondary to medication -She started first line systemic chemotherapy with weekly Taxol  and trastuzumab /Perjeta  on 10/25/22. She has tolerated well so far and her pain has improved overall -PET scan from Jan 01, 2023 showed complete metabolic response, no hypermetabolic uptake in diffuse bone lesions, no other new lesions.  She is clinically also doing much better.  Given her excellent response to chemo, we will change her Taxol  to 2 weeks on and 1 week off -She Unfortunately developed more more brain metastasis on recent brain MRI. -I personally reviewed her restaging PET scan from April 13, 2023, which showed stable disease, no new lesions. -Due to her new brain metastasis, I changed her  therapy to Tucatinib , capecitabine  and trastuzumab , for better brain penetration. She started in late August 2024, tolerating well so far.  Her case was reviewed in CNS tumor board, Dr. Lurena Sally plan to hold on whole brain radiation since she is starting new Her2 antibody and monitor her brain mets closely.  -restaging MRI brian 08/30/2023 showed partial response, PET in 06/2023 showed no hypermetabolic lesions  -brain MRI 12/27/2023 showed progressive brain mets, she started whole brain RT on 5/19 -she is also on Zometa  every 3 months   Assessment & Plan Metastatic breast cancer with brain metastasis Metastatic breast cancer with progression in brain metastasis, currently undergoing brain radiation therapy. Radiation therapy is well-tolerated with increased fatigue and alopecia. No cephalalgia reported. Last PET scan on March 27 was stable, but a follow-up scan is planned to assess current status. Capecitabine  was held during radiation therapy and will be restarted if the upcoming scan is favorable. - Complete brain radiation therapy on February 01, 2024. - Order PET scan for the first week of July 2025. - Restart capecitabine  on February 11, 2024, with a regimen of two tablets twice a day for seven days on, seven days off, if scan results are favorable. - Continue tucatinib  twice daily. - Schedule MRI in August 2025. - Coordinate lab work with PET scan using port for contrast. - Continue trastuzumab  infusions every three weeks.  Fatigue due to cancer treatment Fatigue increased by approximately 20% since starting radiation therapy. Fatigue is a common side effect of radiation therapy and is expected to improve after completion of treatment. - Allow a break after completing radiation therapy to recover from fatigue.  Controlled diarrhea Diarrhea is currently controlled. No new episodes reported.  Plan - She is tolerating whole brain  radiation well with mild fatigue - Will continue Tucatinib  and  trastuzumab   - She will restart capecitabine  on Monday 6/16 - Follow-up in 3 weeks - Restaging PET scan in 5 weeks   SUMMARY OF ONCOLOGIC HISTORY: Oncology History Overview Note  Cancer Staging Malignant neoplasm of lower-inner quadrant of left breast in female, estrogen receptor positive ([name]) Staging form: Breast, AJCC 8th Edition - Clinical stage from 01/16/2018: Stage IA (cT1c, cN0, cM0, G2, ER+, PR+, HER2-) - Signed by Sonja Dobson, MD on 01/23/2018 - Pathologic: Stage IA (pT1c, pN1, cM0, G1, ER+, PR+, HER2-) - Signed by Colie Dawes, MD on 04/09/2018     Malignant neoplasm of lower-inner quadrant of left breast in female, estrogen receptor positive ([name])  01/15/2018 Mammogram   Diagnositc Mammogram 01/15/18  IMPRESSION: 1. Suspicious 1.2 x 1.4 x 1.3 cm mixed echogenicity mass left breast 7 o'clock position retroareolar location at the site of palpable concern.. 2. Indeterminate Within the left breast 7:30 o'clock retroareolar location, adjacent to the palpable mass, is a 0.5 x 0.4 x 0.5 cm oval circumscribed hypoechoic mass. 3. Indeterminate calcifications within the lateral left breast. Location of these calcifications is not definitely confirmed on the true lateral view.    01/16/2018 Initial Biopsy   Diagnosis 01/16/18 1. Breast, left, needle core biopsy, 7:30 o'clock (ribbon clip) - FIBROCYSTIC CHANGES WITH SCLEROSING ADENOSIS AND CALCIFICATIONS. - FIBROADENOMATOID CHANGE. - NO MALIGNANCY IDENTIFIED. 2. Breast, left, needle core biopsy, 7 o'clock position (coil clip) - INVASIVE MAMMARY CARCINOMA, MSBR GRADE I/II. - SEE MICROSCOPIC DESCRIPTION Microscopic Comment  ADDENDUM: Immunohistochemistry for E-Cadherin is strongly positive in the tumor consistent with ductal carcinoma. (JDP:ah 01/17/18)   01/16/2018 Receptors her2   Estrogen Receptor: 100%, POSITIVE, STRONG STAINING INTENSITY Progesterone Receptor: 50%, POSITIVE, STRONG STAINING INTENSITY Proliferation Marker  Ki67: 20% HER2 Negative   01/16/2018 Cancer Staging   Staging form: Breast, AJCC 8th Edition - Clinical stage from 01/16/2018: Stage IA (cT1c, cN0, cM0, G2, ER+, PR+, HER2-) - Signed by Sonja [place], MD on 01/23/2018   01/22/2018 Initial Diagnosis   Malignant neoplasm of lower-inner quadrant of left breast in female, estrogen receptor positive ([name])   02/21/2018 Surgery    LEFT BREAST LUMPECTOMY WITH AXILLARY LYMPH NODE BIOPSY by Dr. Cherlynn Cornfield  02/21/18   02/21/2018 Pathology Results   Diagnosis 02/21/18 1. Breast, lumpectomy, Left - INVASIVE DUCTAL CARCINOMA, GRADE I, 1.6 CM. - DUCTAL CARCINOMA IN SITU, INTERMEDIATE NUCLEAR GRADE. - ANTERIOR AND MEDIAL RESECTION MARGINS ARE POSITIVE FOR CARCINOMA. - NEGATIVE FOR LYMPHOVASCULAR OR PERINEURAL INVASION. - BACKGROUND BREAST TISSUE WITH FIBROCYSTIC CHANGE, INCLUDING SCLEROSING ADENOSIS. - BIOPSY SITE CHANGES. - SEE ONCOLOGY TABLE. 2. Lymph node, sentinel, biopsy, Left Axillary #1 - METASTATIC BREAST CARCINOMA TO A LYMPH NODE, 1.0 CM IN GREATEST DIMENSION, WITH EXTRANODAL EXTENSION (1/1). 3. Lymph node, sentinel, biopsy, Left Axillary #2 - LYMPH NODE, NEGATIVE FOR CARCINOMA (0/1).    02/21/2018 Miscellaneous   Mammaprint 02/21/18 Low Risk with 10-year risk of recurrnce at 10% -No potential signifcant chemotherapy benefit   03/20/2018 Pathology Results   RE-EXCISION OF BREAST LUMPECTOMY by Dr. Cherlynn Cornfield  Diagnosis 03/20/18 1. Breast, excision, Left new anterior margin - FIBROCYSTIC CHANGES WITH ADENOSIS AND CALCIFICATIONS. - HEALING BIOPSY SITE. - THERE IS NO EVIDENCE OF MALIGNANCY. 2. Breast, excision, Left new medial margin - FIBROCYSTIC CHANGES WITH ADENOSIS AND CALCIFICATIONS. - HEALING BIOPSY SITE. - THERE IS NO EVIDENCE OF MALIGNANCY. Microscopic Comment 1. -2. The surgical resection margin(s) of the specimen were inked and microscopically evaluated. (JBK:kh 03-22-18)  04/09/2018 Cancer Staging   Staging form: Breast, AJCC 8th  Edition - Pathologic: Stage IA (pT1c, pN1, cM0, G1, ER+, PR+, HER2-) - Signed by Colie Dawes, MD on 04/09/2018   04/22/2018 - 06/03/2018 Radiation Therapy   Radaiton with Dr. Lurena Sally 04/22/18-06/03/18   05/2018 -  Anti-estrogen oral therapy   Letrozole  2.5mg  started 05/2018    Survivorship   Per Delvin File, NP    05/04/2022 Imaging    IMPRESSION: Cervical spondylosis, as described.   Nonspecific straightening of the expected cervical lordosis.   07/24/2022 Imaging    IMPRESSION: 1. Extensive osseous metastatic disease with pathologic fracture at the base of dens and C2 right lateral mass. Extraosseous tumor at C1 and C2 likely impinging on the right C2 and C3 nerve roots. 2. Degenerative cord impingement at C4-5 to C6-7. Biforaminal impingement at C5-6 and C6-7.   08/03/2022 PET scan    IMPRESSION: 1. Large volume osseous metastasis. 2. Low right cervical and probable right axillary nodal metastasis. 3. Subtle heterogeneous activity throughout the liver with suggestion of small liver lesions (likely new compared to chest CT of 07/16/2020). Findings are overall moderately suspicious for hepatic metastasis. Pre and post contrast abdominal MRI (preferred) or CT could confirm. 4. Right-sided pleural thickening and trace pleural fluid. Right base airspace disease is favored to represent chronic atelectasis. 5. Aortic atherosclerosis (ICD10-I70.0) and emphysema (ICD10-J43.9).     08/11/2022 Genetic Testing   Negative genetic testing on the CancerNext-Expanded+RNAinsight panel.  The report date is August 11, 2022.  The CancerNext-Expanded gene panel offered by [facility] and includes sequencing and rearrangement analysis for the following 77 genes: AIP, ALK, APC*, ATM*, AXIN2, BAP1, BARD1, BLM, BMPR1A, BRCA1*, BRCA2*, BRIP1*, CDC73, CDH1*, CDK4, CDKN1B, CDKN2A, CHEK2*, CTNNA1, DICER1, FANCC, FH, FLCN, GALNT12, KIF1B, LZTR1, MAX, MEN1, MET, MLH1*, MSH2*, MSH3, MSH6*, MUTYH*,  NBN, NF1*, NF2, NTHL1, PALB2*, PHOX2B, PMS2*, POT1, PRKAR1A, PTCH1, PTEN*, RAD51C*, RAD51D*, RB1, RECQL, RET, SDHA, SDHAF2, SDHB, SDHC, SDHD, SMAD4, SMARCA4, SMARCB1, SMARCE1, STK11, SUFU, TMEM127, TP53*, TSC1, TSC2, VHL and XRCC2 (sequencing and deletion/duplication); EGFR, EGLN1, HOXB13, KIT, MITF, PDGFRA, POLD1, and POLE (sequencing only); EPCAM and GREM1 (deletion/duplication only). DNA and RNA analyses performed for * genes.    Lobular carcinoma in situ (LCIS) of right breast  01/20/2020 Mammogram   Diagnostic Mammogram 01/20/20 IMPRESSION: 1.  Stable post lumpectomy changes of the left breast.   2. Suspicious microcalcifications over the right upper outer quadrant spanning 3.6 cm.   02/02/2020 Initial Biopsy   Diagnosis 02/02/20 Breast, right, needle core biopsy, upper outer quadrant, x clip - LOBULAR CARCINOMA IN SITU WITH PLEOMORPHIC FEATURES AND CALCIFICATIONS, INVOLVING ADENOSIS. SEE NOTE Diagnosis Note Immunohistochemical stain for E-cadherin is negative in the lesional cells, consistent with a lobular phenotype. Immunostains for p63, SMM 1 and calponin do not show evidence of invasive carcinoma.    02/04/2020 Initial Diagnosis   Lobular carcinoma in situ (LCIS) of right breast   03/18/2020 Surgery   RIGHT BREAST LUMPECTOMY WITH RADIOACTIVE SEED LOCALIZATION by Dr Unknown Garbe    03/18/2020 Pathology Results   FINAL MICROSCOPIC DIAGNOSIS:   A. BREAST, RIGHT, LUMPECTOMY:  - Pleomorphic lobular carcinoma in situ with calcifications and  underlying complex sclerosing lesion, adenosis and fibroadenomatoid  change.  - Margins of resection are not involved (Closest margins: < 1 mm,  anterior, posterior, inferior and medial).  - Biopsy site.    COMMENT:   P63, Calponin and SMM-1 demonstrate the presence of myoepithelium in the  select focus.    Metastatic  breast cancer to bone and brain  05/04/2022 Imaging    IMPRESSION: Cervical spondylosis, as described.   Nonspecific  straightening of the expected cervical lordosis.   07/24/2022 Imaging    IMPRESSION: 1. Extensive osseous metastatic disease with pathologic fracture at the base of dens and C2 right lateral mass. Extraosseous tumor at C1 and C2 likely impinging on the right C2 and C3 nerve roots. 2. Degenerative cord impingement at C4-5 to C6-7. Biforaminal impingement at C5-6 and C6-7.   08/02/2022 Initial Diagnosis   Cancer, metastatic to bone ([name])   08/03/2022 PET scan    IMPRESSION: 1. Large volume osseous metastasis. 2. Low right cervical and probable right axillary nodal metastasis. 3. Subtle heterogeneous activity throughout the liver with suggestion of small liver lesions (likely new compared to chest CT of 07/16/2020). Findings are overall moderately suspicious for hepatic metastasis. Pre and post contrast abdominal MRI (preferred) or CT could confirm. 4. Right-sided pleural thickening and trace pleural fluid. Right base airspace disease is favored to represent chronic atelectasis. 5. Aortic atherosclerosis (ICD10-I70.0) and emphysema (ICD10-J43.9).     10/26/2022 - 04/12/2023 Chemotherapy   Patient is on Treatment Plan : BREAST Paclitaxel  D1,8,15 + Trastuzumab  D1 + Pertuzumab  D1 q21d x 8 cycles / Trastuzumab  D1 + Pertuzumab  D1 q21d x 4 cycles     05/03/2023 -  Chemotherapy   Patient is on Treatment Plan : BREAST MAINTENANCE Trastuzumab  IV (6) or SQ (600) D1 q21d X 11 Cycles        Discussed the use of AI scribe software for clinical note transcription with the patient, who gave verbal consent to proceed.  History of Present Illness Martha Hally "[name]" is a 63 year old female with metastatic breast cancer who presents for follow-up.  She is undergoing brain radiation therapy, scheduled to complete on February 01, 2024, and experiences increased fatigue and hair loss. No headaches are present. She takes Tucatinib  twice daily and has stopped Xeloda . Her last PET scan on November 22, 2023, was favorable, but a recent brain MRI showed some progression, prompting radiation therapy. Energy levels have decreased by 20% since starting radiation. No new issues with her chest, stomach, or back are noted, and her skin condition is stable. She is not experiencing swelling or significant back pain and is not on pain medication. Diarrhea is controlled with Lomedia.     All other systems were reviewed with the patient and are negative.  MEDICAL HISTORY:  Past Medical History:  Diagnosis Date   Anxiety    Cancer ([name]) 2022   right breast LCIS   Cancer ([name]) 2019   left breast   Depression    Dysrhythmia    SVT, s/p ablation ~ 2012 in at [facility]   Ectopic pregnancy    Family history of breast cancer    Family history of melanoma    History of radiation therapy 04/22/18-06/03/18   Left Breast, left SCV, axilla 50 Gy in 25 fractions, Left breast boost 10 Gy in 5 fractions.    Hypothyroidism    Personal history of radiation therapy    PONV (postoperative nausea and vomiting)    Thyroid  disease     SURGICAL HISTORY: Past Surgical History:  Procedure Laterality Date   APPENDECTOMY     BILATERAL SALPINGECTOMY     BREAST BIOPSY Right 2014   fibroadenoma   BREAST BIOPSY Left 01/16/2018   BREAST BIOPSY Right 02/02/2020   LCIS   BREAST BIOPSY Right 08/04/2020  x2 LCIS   BREAST BIOPSY Right 08/13/2020   x2   BREAST EXCISIONAL BIOPSY Left 1990   benign   BREAST EXCISIONAL BIOPSY Right 02/02/2020   LCIS   BREAST LUMPECTOMY Left 01/22/2018   BREAST LUMPECTOMY WITH AXILLARY LYMPH NODE BIOPSY Left 02/21/2018   Procedure: LEFT BREAST LUMPECTOMY WITH AXILLARY LYMPH NODE BIOPSY;  Surgeon: Lockie Rima, MD;  Location: [facility] OR;  Service: General;  Laterality: Left;   BREAST LUMPECTOMY WITH RADIOACTIVE SEED LOCALIZATION Right 03/18/2020   Procedure: RIGHT BREAST LUMPECTOMY WITH RADIOACTIVE SEED LOCALIZATION;  Surgeon: Lockie Rima, MD;  Location: [facility] OR;  Service:  General;  Laterality: Right;   BREAST SURGERY Left 1993   cyst removed   ENDOMETRIAL ABLATION     EYE SURGERY     GLAUCOMA SURGERY Bilateral    IR IMAGING GUIDED PORT INSERTION  10/16/2022   POSTERIOR CERVICAL FUSION/FORAMINOTOMY N/A 08/17/2022   Procedure: Cervical One-Cervical Four POSTERIOR CERVICAL FUSION,  Cervical One LAMINECTOMY REDUCTION OF Cervical Two Fracture, Biopsy of Right Cerical Two Pars  Lesion;  Surgeon: Van Gelinas, MD;  Location: [facility] OR;  Service: Neurosurgery;  Laterality: N/A;   POSTERIOR CERVICAL FUSION/FORAMINOTOMY N/A 09/06/2022   Procedure: Posterior Cervical Fusion, Foraminotomy , Cervical Five-Six, Cervical Six-Seven; Extension of  fusion Cervical Four- Thoracic One;  Surgeon: Van Gelinas, MD;  Location: [facility] OR;  Service: Neurosurgery;  Laterality: N/A;   RADIOACTIVE SEED GUIDED EXCISIONAL BREAST BIOPSY Right 04/28/2021   Procedure: RADIOACTIVE SEED GUIDED EXCISIONAL RIGHT BREAST BIOPSY X2;  Surgeon: Lockie Rima, MD;  Location: [facility];  Service: General;  Laterality: Right;   RE-EXCISION OF BREAST LUMPECTOMY Left 03/20/2018   Procedure: RE-EXCISION OF BREAST LUMPECTOMY;  Surgeon: Lockie Rima, MD;  Location: [facility];  Service: General;  Laterality: Left;    I have reviewed the social history and family history with the patient and they are unchanged from previous note.  ALLERGIES:  is allergic to morphine and codeine.  MEDICATIONS:  Current Outpatient Medications  Medication Sig Dispense Refill   acetaminophen  (TYLENOL ) 500 MG tablet Take 2 tablets (1,000 mg total) by mouth every 8 (eight) hours. 30 tablet 0   citalopram  (CELEXA ) 40 MG tablet TAKE 1 TABLET BY MOUTH EVERY DAY 90 tablet 2   diphenoxylate -atropine  (LOMOTIL ) 2.5-0.025 MG tablet Take 1 tablet by mouth 4 (four) times daily as needed for diarrhea or loose stools. 90 tablet 3   levothyroxine  (SYNTHROID ) 75 MCG tablet Take 1 tablet (75 mcg total) by mouth  daily before breakfast. 90 tablet 3   loperamide  (IMODIUM  A-D) 2 MG tablet Take 1 tablet (2 mg total) by mouth 4 (four) times daily as needed for diarrhea or loose stools. 90 tablet 0   Mouthwashes (MOUTH RINSE) LIQD solution 15 mLs by Mouth Rinse route as needed (for oral care).  0   ondansetron  (ZOFRAN ) 8 MG tablet Take 1 tablet (8 mg total) by mouth every 8 (eight) hours as needed for nausea or vomiting. 20 tablet 1   OVER THE COUNTER MEDICATION Take 1 tablet by mouth daily. Protandim Supplement     pregabalin  (LYRICA ) 25 MG capsule Take 1 capsule (25 mg total) by mouth 3 (three) times daily. 90 capsule 3   tucatinib  (TUKYSA ) 150 MG tablet Take 2 tablets (300 mg total) by mouth 2 (two) times daily. Take as instructed per MD. 120 tablet 2   capecitabine  (XELODA ) 500 MG tablet TAKE 2 TABLETS TWICE DAILY FOR 7 DAYS ON THEN 7 DAYS  OFF. TAKE AFTER MEALS 56 tablet 0   nicotine  (NICODERM CQ  - DOSED IN MG/24 HR) 7 mg/24hr patch Place 1 patch (7 mg total) onto the skin daily. 28 patch 0   No current facility-administered medications for this visit.    PHYSICAL EXAMINATION: ECOG PERFORMANCE STATUS: 2 - Symptomatic, <50% confined to bed  Vitals:   01/31/24 1007  BP: 115/77  Pulse: 83  Resp: 15  Temp: 98.3 F (36.8 C)  SpO2: 97%   Wt Readings from Last 3 Encounters:  01/31/24 73 lb 11.2 oz (33.4 kg)  01/24/24 74 lb 14.4 oz (34 kg)  01/10/24 76 lb 3.2 oz (34.6 kg)     GENERAL:alert, no distress and comfortable SKIN: skin color, texture, turgor are normal, no rashes or significant lesions EYES: normal, Conjunctiva are pink and non-injected, sclera clear NECK: supple, thyroid  normal size, non-tender, without nodularity LYMPH:  no palpable lymphadenopathy in the cervical, axillary  LUNGS: clear to auscultation and percussion with normal breathing effort HEART: regular rate & rhythm and no murmurs and no lower extremity edema ABDOMEN:abdomen soft, non-tender and normal bowel  sounds Musculoskeletal:no cyanosis of digits and no clubbing  NEURO: alert & oriented x 3 with fluent speech, no focal motor/sensory deficits  Physical Exam    LABORATORY DATA:  I have reviewed the data as listed    Latest Ref Rng & Units 01/31/2024    9:54 AM 01/10/2024   10:39 AM 12/20/2023   10:14 AM  CBC  WBC 4.0 - 10.5 K/uL 5.3  6.4  5.6   Hemoglobin 12.0 - 15.0 g/dL 40.9  81.1  91.4   Hematocrit 36.0 - 46.0 % 43.4  42.0  41.8   Platelets 150 - 400 K/uL 158  168  212         Latest Ref Rng & Units 01/31/2024    9:54 AM 01/10/2024   10:39 AM 12/20/2023   10:14 AM  CMP  Glucose 70 - 99 mg/dL 782  91  92   BUN 8 - 23 mg/dL 11  11  8    Creatinine 0.44 - 1.00 mg/dL 9.56  2.13  0.86   Sodium 135 - 145 mmol/L 137  137  137   Potassium 3.5 - 5.1 mmol/L 4.2  4.1  3.9   Chloride 98 - 111 mmol/L 102  101  103   CO2 22 - 32 mmol/L 31  32  33   Calcium 8.9 - 10.3 mg/dL 9.2  9.3  9.3   Total Protein 6.5 - 8.1 g/dL 7.0  6.8  6.7   Total Bilirubin 0.0 - 1.2 mg/dL 1.0  0.8  0.6   Alkaline Phos 38 - 126 U/L 109  77  94   AST 15 - 41 U/L 20  15  18    ALT 0 - 44 U/L 29  10  11        RADIOGRAPHIC STUDIES: I have personally reviewed the radiological images as listed and agreed with the findings in the report. No results found.    Orders Placed This Encounter  Procedures   NM PET Image Restag (PS) Skull Base To Thigh    Standing Status:   Future    Expected Date:   03/01/2024    Expiration Date:   01/30/2025    If indicated for the ordered procedure, I authorize the administration of a radiopharmaceutical per Radiology protocol:   Yes    Preferred imaging location?:   [facility]   All questions were  answered. The patient knows to call the clinic with any problems, questions or concerns. No barriers to learning was detected. The total time spent in the appointment was 30 minutes, including review of chart and various tests results, discussions about plan of care and coordination of  care plan     Sonja Rosebud, MD 01/31/2024

## 2024-02-01 ENCOUNTER — Ambulatory Visit
Admission: RE | Admit: 2024-02-01 | Discharge: 2024-02-01 | Disposition: A | Discharge status: Home / Self Care | Source: Ambulatory Visit | Attending: Radiation Oncology | Admitting: Radiation Oncology

## 2024-02-01 ENCOUNTER — Other Ambulatory Visit: Payer: Self-pay

## 2024-02-01 DIAGNOSIS — C7951 Secondary malignant neoplasm of bone: Secondary | ICD-10-CM | POA: Diagnosis not present

## 2024-02-01 DIAGNOSIS — Z170 Estrogen receptor positive status [ER+]: Secondary | ICD-10-CM | POA: Diagnosis not present

## 2024-02-01 DIAGNOSIS — Z5112 Encounter for antineoplastic immunotherapy: Secondary | ICD-10-CM | POA: Diagnosis not present

## 2024-02-01 DIAGNOSIS — C50312 Malignant neoplasm of lower-inner quadrant of left female breast: Secondary | ICD-10-CM | POA: Diagnosis not present

## 2024-02-01 DIAGNOSIS — Z79899 Other long term (current) drug therapy: Secondary | ICD-10-CM | POA: Diagnosis not present

## 2024-02-01 DIAGNOSIS — Z923 Personal history of irradiation: Secondary | ICD-10-CM | POA: Diagnosis not present

## 2024-02-01 DIAGNOSIS — C7931 Secondary malignant neoplasm of brain: Secondary | ICD-10-CM | POA: Diagnosis not present

## 2024-02-01 DIAGNOSIS — Z510 Encounter for antineoplastic radiation therapy: Secondary | ICD-10-CM | POA: Diagnosis not present

## 2024-02-01 LAB — RAD ONC ARIA SESSION SUMMARY
Course Elapsed Days: 18
Plan Fractions Treated to Date: 14
Plan Prescribed Dose Per Fraction: 2.5 Gy
Plan Total Fractions Prescribed: 14
Plan Total Prescribed Dose: 35 Gy
Reference Point Dosage Given to Date: 35 Gy
Reference Point Session Dosage Given: 2.5 Gy
Session Number: 14

## 2024-02-04 ENCOUNTER — Other Ambulatory Visit: Payer: Self-pay | Admitting: Nurse Practitioner

## 2024-02-04 DIAGNOSIS — M792 Neuralgia and neuritis, unspecified: Secondary | ICD-10-CM

## 2024-02-04 DIAGNOSIS — C7951 Secondary malignant neoplasm of bone: Secondary | ICD-10-CM

## 2024-02-04 DIAGNOSIS — Z515 Encounter for palliative care: Secondary | ICD-10-CM

## 2024-02-04 MED ORDER — PREGABALIN 25 MG PO CAPS
25.0000 mg | ORAL_CAPSULE | Freq: Three times a day (TID) | ORAL | 3 refills | Status: DC
Start: 2024-02-04 — End: 2024-06-16

## 2024-02-04 NOTE — Radiation Completion Notes (Signed)
 Patient Name: Martha [name], Martha [name] MRN: [id number] Date of Birth: 01/05/1961 Referring Physician: Carloyn Chi, M.D. Date of Service: 2024-02-04 Radiation Oncologist: Colie Dawes, M.D. [facility]                             RADIATION ONCOLOGY END OF TREATMENT NOTE     Diagnosis: C79.31 Secondary malignant neoplasm of brain Staging on 2018-04-09: Malignant neoplasm of lower-inner quadrant of left breast in female, estrogen receptor positive ([name]) T=pT1c, N=pN1, M=cM0 Staging on 2018-01-16: Malignant neoplasm of lower-inner quadrant of left breast in female, estrogen receptor positive ([name]) T=cT1c, N=cN0, M=cM0 Intent: Palliative     ==========DELIVERED PLANS==========  First Treatment Date: 2024-01-14 Last Treatment Date: 2024-02-01   Plan Name: Brain_Whole Site: Brain Technique: Isodose Plan Mode: Photon Dose Per Fraction: 2.5 Gy Prescribed Dose (Delivered / Prescribed): 35 Gy / 35 Gy Prescribed Fxs (Delivered / Prescribed): 14 / 14     ==========ON TREATMENT VISIT DATES========== 2024-01-14, 2024-01-28, 2024-02-01     ==========UPCOMING VISITS========== 04/15/2024 [facility] EST PT 30 Mamie Searles, MD  04/14/2024 [facility] TUMOR BOARD [facility] CONFERENCE  04/10/2024 [facility]-315 MRI MR BRAIN WWO CONTRAST-GI GI-315 MR 3  03/13/2024 [facility] PALLIATIVE CARE    [facility] PALLIATIVE CARE  03/13/2024 [facility] PORT FLUSH W/LAB [facility] FLUSH  03/13/2024 [facility] INFUSION 1HR30MIN (90) [facility] INFUSION  03/13/2024 [facility] EST PT 20 Sonja [place], MD  03/06/2024 [facility] EST PT 20 Sonja Luckey, MD  03/06/2024 [facility] PORT FLUSH W/LAB [facility] FLUSH  02/28/2024 [facility]IATION ONC FOLLOW UP 20 Colie Dawes, MD  02/21/2024 [facility] PALLIATIVE CARE    [facility] PALLIATIVE CARE  02/21/2024 [facility] ON[facility] PALLIATIVE CARE     [facility] PALLIATIVE CARE  02/21/2024 [facility] ON[facility] EST PT 20 Sonja [place], MD  02/21/2024 [facility] INFUSION 1HR30MIN (90) [facility] INFUSION  02/21/2024 [facility] PORT FLUSH W/LAB [facility] FLUSH        ==========APPENDIX - ON TREATMENT VISIT NOTES==========   See weekly On Treatment Notes in Epic for details in the Media tab (listed as Progress notes on the On Treatment Visit Dates listed above).

## 2024-02-07 ENCOUNTER — Other Ambulatory Visit: Payer: Self-pay

## 2024-02-14 ENCOUNTER — Other Ambulatory Visit: Payer: Self-pay

## 2024-02-14 MED ORDER — ONDANSETRON HCL 8 MG PO TABS: 8.0000 mg | ORAL_TABLET | Freq: Three times a day (TID) | ORAL | 2 refills | Status: AC | PRN

## 2024-02-18 ENCOUNTER — Encounter (HOSPITAL_COMMUNITY): Admission: RE | Admit: 2024-02-18 | Source: Ambulatory Visit

## 2024-02-19 ENCOUNTER — Encounter (HOSPITAL_COMMUNITY)
Admission: RE | Admit: 2024-02-19 | Discharge: 2024-02-19 | Disposition: A | Discharge status: Home / Self Care | Source: Ambulatory Visit | Attending: Hematology | Admitting: Hematology

## 2024-02-19 DIAGNOSIS — C7981 Secondary malignant neoplasm of breast: Secondary | ICD-10-CM | POA: Diagnosis not present

## 2024-02-19 DIAGNOSIS — C7951 Secondary malignant neoplasm of bone: Secondary | ICD-10-CM | POA: Diagnosis not present

## 2024-02-19 LAB — GLUCOSE, CAPILLARY: Glucose-Capillary: 116 mg/dL — ABNORMAL HIGH (ref 70–99)

## 2024-02-19 MED ORDER — FLUDEOXYGLUCOSE F - 18 (FDG) INJECTION
5.2200 | Freq: Once | INTRAVENOUS | Status: AC
  Administered 2024-02-19: 5.22 via INTRAVENOUS

## 2024-02-21 ENCOUNTER — Encounter: Payer: Self-pay | Admitting: Hematology

## 2024-02-21 ENCOUNTER — Inpatient Hospital Stay

## 2024-02-21 ENCOUNTER — Encounter: Payer: Self-pay | Admitting: Nurse Practitioner

## 2024-02-21 ENCOUNTER — Encounter

## 2024-02-21 ENCOUNTER — Inpatient Hospital Stay: Admitting: Hematology

## 2024-02-21 ENCOUNTER — Inpatient Hospital Stay (HOSPITAL_BASED_OUTPATIENT_CLINIC_OR_DEPARTMENT_OTHER): Admitting: Nurse Practitioner

## 2024-02-21 ENCOUNTER — Other Ambulatory Visit: Payer: Self-pay

## 2024-02-21 VITALS — BP 118/92 | HR 89 | Temp 97.6°F | Resp 16 | Ht 62.0 in | Wt 71.6 lb

## 2024-02-21 DIAGNOSIS — C50312 Malignant neoplasm of lower-inner quadrant of left female breast: Secondary | ICD-10-CM

## 2024-02-21 DIAGNOSIS — Z170 Estrogen receptor positive status [ER+]: Secondary | ICD-10-CM | POA: Diagnosis not present

## 2024-02-21 DIAGNOSIS — C7951 Secondary malignant neoplasm of bone: Secondary | ICD-10-CM

## 2024-02-21 DIAGNOSIS — C7931 Secondary malignant neoplasm of brain: Secondary | ICD-10-CM | POA: Diagnosis not present

## 2024-02-21 DIAGNOSIS — Z79899 Other long term (current) drug therapy: Secondary | ICD-10-CM | POA: Diagnosis not present

## 2024-02-21 DIAGNOSIS — C799 Secondary malignant neoplasm of unspecified site: Secondary | ICD-10-CM

## 2024-02-21 DIAGNOSIS — M792 Neuralgia and neuritis, unspecified: Secondary | ICD-10-CM

## 2024-02-21 DIAGNOSIS — Z515 Encounter for palliative care: Secondary | ICD-10-CM | POA: Diagnosis not present

## 2024-02-21 DIAGNOSIS — G893 Neoplasm related pain (acute) (chronic): Secondary | ICD-10-CM | POA: Diagnosis not present

## 2024-02-21 DIAGNOSIS — Z95828 Presence of other vascular implants and grafts: Secondary | ICD-10-CM

## 2024-02-21 DIAGNOSIS — Z510 Encounter for antineoplastic radiation therapy: Secondary | ICD-10-CM | POA: Diagnosis not present

## 2024-02-21 DIAGNOSIS — Z923 Personal history of irradiation: Secondary | ICD-10-CM | POA: Diagnosis not present

## 2024-02-21 DIAGNOSIS — Z5112 Encounter for antineoplastic immunotherapy: Secondary | ICD-10-CM | POA: Diagnosis not present

## 2024-02-21 LAB — CMP (CANCER CENTER ONLY)
ALT: 7 U/L (ref 0–44)
AST: 13 U/L — ABNORMAL LOW (ref 15–41)
Albumin: 4.6 g/dL (ref 3.5–5.0)
Alkaline Phosphatase: 64 U/L (ref 38–126)
Anion gap: 5 (ref 5–15)
BUN: 9 mg/dL (ref 8–23)
CO2: 32 mmol/L (ref 22–32)
Calcium: 9.5 mg/dL (ref 8.9–10.3)
Chloride: 99 mmol/L (ref 98–111)
Creatinine: 0.49 mg/dL (ref 0.44–1.00)
GFR, Estimated: >60 mL/min (ref >60)
Glucose, Bld: 108 mg/dL — ABNORMAL HIGH (ref 70–99)
Potassium: 3.8 mmol/L (ref 3.5–5.1)
Sodium: 136 mmol/L (ref 135–145)
Total Bilirubin: 0.8 mg/dL (ref 0.0–1.2)
Total Protein: 6.8 g/dL (ref 6.5–8.1)

## 2024-02-21 LAB — CBC WITH DIFFERENTIAL (CANCER CENTER ONLY)
Abs Immature Granulocytes: 0.02 10*3/uL (ref 0.00–0.07)
Basophils Absolute: 0.1 10*3/uL (ref 0.0–0.1)
Basophils Relative: 1 %
Eosinophils Absolute: 0 10*3/uL (ref 0.0–0.5)
Eosinophils Relative: 1 %
HCT: 44 % (ref 36.0–46.0)
Hemoglobin: 15.8 g/dL — ABNORMAL HIGH (ref 12.0–15.0)
Immature Granulocytes: 0 %
Lymphocytes Relative: 14 %
Lymphs Abs: 0.8 10*3/uL (ref 0.7–4.0)
MCH: 35.1 pg — ABNORMAL HIGH (ref 26.0–34.0)
MCHC: 35.9 g/dL (ref 30.0–36.0)
MCV: 97.8 fL (ref 80.0–100.0)
Monocytes Absolute: 0.4 10*3/uL (ref 0.1–1.0)
Monocytes Relative: 8 %
Neutro Abs: 4.2 10*3/uL (ref 1.7–7.7)
Neutrophils Relative %: 76 %
Platelet Count: 188 10*3/uL (ref 150–400)
RBC: 4.5 MIL/uL (ref 3.87–5.11)
RDW: 14.2 % (ref 11.5–15.5)
WBC Count: 5.6 10*3/uL (ref 4.0–10.5)
nRBC: 0 % (ref 0.0–0.2)

## 2024-02-21 MED ORDER — HEPARIN SOD (PORK) LOCK FLUSH 100 UNIT/ML IV SOLN
500.0000 [IU] | Freq: Once | INTRAVENOUS | Status: AC | PRN
  Administered 2024-02-21: 500 [IU]

## 2024-02-21 MED ORDER — TRASTUZUMAB-ANNS CHEMO 150 MG IV SOLR
6.0000 mg/kg | Freq: Once | INTRAVENOUS | Status: AC
  Administered 2024-02-21: 210 mg via INTRAVENOUS
  Filled 2024-02-21: qty 10

## 2024-02-21 MED ORDER — DIPHENHYDRAMINE HCL 25 MG PO CAPS
50.0000 mg | ORAL_CAPSULE | Freq: Once | ORAL | Status: AC
  Administered 2024-02-21: 25 mg via ORAL
  Filled 2024-02-21: qty 2

## 2024-02-21 MED ORDER — SODIUM CHLORIDE 0.9 % IV SOLN: Freq: Once | INTRAVENOUS | Status: AC

## 2024-02-21 MED ORDER — CAPECITABINE 500 MG PO TABS: ORAL_TABLET | ORAL | 0 refills | Status: DC

## 2024-02-21 MED ORDER — ACETAMINOPHEN 325 MG PO TABS
650.0000 mg | ORAL_TABLET | Freq: Once | ORAL | Status: AC
  Administered 2024-02-21: 650 mg via ORAL
  Filled 2024-02-21: qty 2

## 2024-02-21 MED ORDER — SODIUM CHLORIDE 0.9% FLUSH
10.0000 mL | Freq: Once | INTRAVENOUS | Status: AC | PRN
  Administered 2024-02-21: 10 mL

## 2024-02-21 MED ORDER — SODIUM CHLORIDE 0.9% FLUSH
10.0000 mL | INTRAVENOUS | Status: DC | PRN
Start: 2024-02-21 — End: 2024-02-21
  Administered 2024-02-21: 10 mL

## 2024-02-21 NOTE — Patient Instructions (Signed)
 [facility] - A DEPT OF [facility]  Discharge Instructions: Thank you for choosing [facility] to provide your oncology and hematology care.   If you have a lab appointment with the Cancer Center, please go directly to the Cancer Center and check in at the registration area.   Wear comfortable clothing and clothing appropriate for easy access to any Portacath or PICC line.   We strive to give you quality time with your provider. You may need to reschedule your appointment if you arrive late (15 or more minutes).  Arriving late affects you and other patients whose appointments are after yours.  Also, if you miss three or more appointments without notifying the office, you may be dismissed from the clinic at the provider's discretion.      For prescription refill requests, have your pharmacy contact our office and allow 72 hours for refills to be completed.    Today you received the following chemotherapy and/or immunotherapy agents: Kanjinti      To help prevent nausea and vomiting after your treatment, we encourage you to take your nausea medication as directed.  BELOW ARE SYMPTOMS THAT SHOULD BE REPORTED IMMEDIATELY: *FEVER GREATER THAN 100.4 F (38 C) OR HIGHER *CHILLS OR SWEATING *NAUSEA AND VOMITING THAT IS NOT CONTROLLED WITH YOUR NAUSEA MEDICATION *UNUSUAL SHORTNESS OF BREATH *UNUSUAL BRUISING OR BLEEDING *URINARY PROBLEMS (pain or burning when urinating, or frequent urination) *BOWEL PROBLEMS (unusual diarrhea, constipation, pain near the anus) TENDERNESS IN MOUTH AND THROAT WITH OR WITHOUT PRESENCE OF ULCERS (sore throat, sores in mouth, or a toothache) UNUSUAL RASH, SWELLING OR PAIN  UNUSUAL VAGINAL DISCHARGE OR ITCHING   Items with * indicate a potential emergency and should be followed up as soon as possible or go to the Emergency Department if any problems should occur.  Please show the CHEMOTHERAPY ALERT CARD or IMMUNOTHERAPY  ALERT CARD at check-in to the Emergency Department and triage nurse.  Should you have questions after your visit or need to cancel or reschedule your appointment, please contact [facility] - A DEPT OF [facility]  Dept: [phone]  and follow the prompts.  Office hours are 8:00 a.m. to 4:30 p.m. Monday - Friday. Please note that voicemails left after 4:00 p.m. may not be returned until the following business day.  We are closed weekends and major holidays. You have access to a nurse at all times for urgent questions. Please call the main number to the clinic Dept: [phone] and follow the prompts.   For any non-urgent questions, you may also contact your provider using MyChart. We now offer e-Visits for anyone 60 and older to request care online for non-urgent symptoms. For details visit [email or web address].   Also download the MyChart app! Go to the app store, search "MyChart", open the app, select [facility], and log in with your MyChart username and password.

## 2024-02-21 NOTE — Progress Notes (Signed)
 Palliative Medicine [facility]  [phone] [phone]   Name: Martha [name] Date: 02/21/2024 MRN: [id number]  DOB: Apr 12, 1961  Patient Care Team: [name], MD as PCP - General (Family Medicine) [name], MD as Consulting Physician (General Surgery) [name], MD as Consulting Physician (Hematology) [name], MD as Attending Physician (Radiation Oncology) [name], [name], NP as Nurse Practitioner (Nurse Practitioner)    INTERVAL HISTORY: Martha [name] is a 63 y.o. female with oncologic medical history including ER+ breast cancer (01/2018), right breast cancer (04/2021), now with metastatic disease progression involving brain and liver, pathological fractures with osseous mets s/p brain radiation. .  Palliative ask to see for symptom management and goals of care.   SOCIAL HISTORY:     reports that she has been smoking cigarettes. She has a 20 pack-year smoking history. She has never used smokeless tobacco. She reports that she does not currently use alcohol after a past usage of about 28.0 standard drinks of alcohol per week. She reports that she does not use drugs.  ADVANCE DIRECTIVES:  On file   CODE STATUS: DNR  PAST MEDICAL HISTORY: Past Medical History:  Diagnosis Date   Anxiety    Cancer ([name]) 2022   right breast LCIS   Cancer ([name]) 2019   left breast   Depression    Dysrhythmia    SVT, s/p ablation ~ 2012 in at [facility]   Ectopic pregnancy    Family history of breast cancer    Family history of melanoma    History of radiation therapy 04/22/18-06/03/18   Left Breast, left SCV, axilla 50 Gy in 25 fractions, Left breast boost 10 Gy in 5 fractions.    Hypothyroidism    Personal history of radiation therapy    PONV (postoperative nausea and vomiting)    Thyroid  disease     ALLERGIES:  is allergic to morphine and codeine.  MEDICATIONS:  Current Outpatient Medications  Medication  Sig Dispense Refill   acetaminophen  (TYLENOL ) 500 MG tablet Take 2 tablets (1,000 mg total) by mouth every 8 (eight) hours. 30 tablet 0   capecitabine  (XELODA ) 500 MG tablet TAKE 2 TABLETS TWICE DAILY FOR 7 DAYS ON THEN 7 DAYS OFF. TAKE AFTER MEALS 56 tablet 0   citalopram  (CELEXA ) 40 MG tablet TAKE 1 TABLET BY MOUTH EVERY DAY 90 tablet 2   diphenoxylate -atropine  (LOMOTIL ) 2.5-0.025 MG tablet Take 1 tablet by mouth 4 (four) times daily as needed for diarrhea or loose stools. 90 tablet 3   levothyroxine  (SYNTHROID ) 75 MCG tablet Take 1 tablet (75 mcg total) by mouth daily before breakfast. 90 tablet 3   loperamide  (IMODIUM  A-D) 2 MG tablet Take 1 tablet (2 mg total) by mouth 4 (four) times daily as needed for diarrhea or loose stools. 90 tablet 0   Mouthwashes (MOUTH RINSE) LIQD solution 15 mLs by Mouth Rinse route as needed (for oral care).  0   nicotine  (NICODERM CQ  - DOSED IN MG/24 HR) 7 mg/24hr patch Place 1 patch (7 mg total) onto the skin daily. 28 patch 0   ondansetron  (ZOFRAN ) 8 MG tablet Take 1 tablet (8 mg total) by mouth every 8 (eight) hours as needed for nausea or vomiting. 20 tablet 2   OVER THE COUNTER MEDICATION Take 1 tablet by mouth daily. Protandim Supplement     pregabalin  (LYRICA ) 25 MG capsule Take 1 capsule (25 mg total) by mouth 3 (three) times daily. 90 capsule  3   tucatinib  (TUKYSA ) 150 MG tablet Take 2 tablets (300 mg total) by mouth 2 (two) times daily. Take as instructed per MD. 120 tablet 2   No current facility-administered medications for this visit.   Facility-Administered Medications Ordered in Other Visits  Medication Dose Route Frequency Provider Last Rate Last Admin   heparin  lock flush 100 unit/mL  500 Units Intracatheter Once PRN [name], MD       sodium chloride  flush (NS) 0.9 % injection 10 mL  10 mL Intracatheter PRN [name], MD        VITAL SIGNS: There were no vitals taken for this visit. There were no vitals filed for this visit.   Estimated  body mass index is 13.1 kg/m as calculated from the following:   Height as of an earlier encounter on 02/21/24: 5' 2 (1.575 m).   Weight as of an earlier encounter on 02/21/24: 71 lb 9.6 oz (32.5 kg).   PERFORMANCE STATUS (ECOG) : 1 - Symptomatic but completely ambulatory   Physical Exam General: NAD Cardiovascular: regular rate and rhythm Pulmonary: normal breathing pattern Extremities: no edema, no joint deformities Skin: no rashes Neurological: AAO x3  IMPRESSION: Discussed the use of AI scribe software for clinical note transcription with the patient, who gave verbal consent to proceed.  History of Present Illness Martha [name] is a 63 year old female with metastatic cancer who presents to clinic for follow-up. She is doing well overall. Denies concerns for nausea, vomiting, constipation, or diarrhea. Patient shares recent girls trip to [facility] which she enjoyed. States due to heat and humidity did not spend time on the beach however enjoyed relaxation time with her girlfriends.   Occasional fatigue however she is remaining as active as possible. Pain and neuropathic symptoms are well controlled on Pregabalin . No adjustments to regimen at this time.   All symptoms well managed. All questions answered and support provided.   I discussed the importance of continued conversation with family and their medical providers regarding overall plan of care and treatment options, ensuring decisions are within the context of the patients values and GOCs. Assessment & Plan  Neuropathy Pregabalin  dosing tolerated. Discomfort much improved. - Continue pregabalin  dosage to one capsule (25mg ) three times a day.  Quality of Life QOL much improved. Decreased symptom burden.   I will see patient back in 3-4 weeks. Sooner if needed.   Patient expressed understanding and was in agreement with this plan. She also understands that She can call the clinic at any time with any  questions, concerns, or complaints.   Any controlled substances utilized were prescribed in the context of palliative care. PDMP has been reviewed.   Visit consisted of counseling and education dealing with the complex and emotionally intense issues of symptom management and palliative care in the setting of serious and potentially life-threatening illness.  Levon Borer, AGPCNP-BC  Palliative Medicine Team/[facility]

## 2024-02-21 NOTE — Assessment & Plan Note (Signed)
-  Diagnosed in 06/2022, ER-/PR-/HER2+ --PET scan from 08/03/2022 showed diffuse bone mets  -she underwent cervical laminectomy and fusion by Dr. Debby on 08/17/2022, biopsy confirmed metastatic breast cancer, ER/PR negative, HER2 positive. -due to cervical cord compression with myelopathy, she underwent a second cervical spine surgery on September 06, 2022, and completed inpatient rehabitation. -She has completed brain, cervical and lumbar spine radiation in Feb 2024 -She was recently hospitalized for altered mental status and confusion, probably secondary to medication -She started first line systemic chemotherapy with weekly Taxol  and trastuzumab /Perjeta  on 10/25/22. She has tolerated well so far and her pain has improved overall -PET scan from Jan 01, 2023 showed complete metabolic response, no hypermetabolic uptake in diffuse bone lesions, no other new lesions.  She is clinically also doing much better.  Given her excellent response to chemo, we will change her Taxol  to 2 weeks on and 1 week off -She Unfortunately developed more more brain metastasis on recent brain MRI. -I personally reviewed her restaging PET scan from April 13, 2023, which showed stable disease, no new lesions. -Due to her new brain metastasis, I changed her therapy to Tucatinib , capecitabine  and trastuzumab , for better brain penetration. She started in late August 2024, tolerating well so far.  Her case was reviewed in CNS tumor board, Dr. Izell plan to hold on whole brain radiation since she is starting new Her2 antibody and monitor her brain mets closely.  -restaging MRI brian 08/30/2023 showed partial response, PET in 06/2023 showed no hypermetabolic lesions  -brain MRI 12/27/2023 showed progressive brain mets, she started whole brain RT on 5/19 and completed on 02/01/2024  -she is also on Zometa  every 3 months

## 2024-02-21 NOTE — Progress Notes (Signed)
 [facility]   Telephone:([phone] Fax:([phone]   Clinic Follow up Note   Patient Care Team: [name], MD as PCP - General (Family Medicine) [name], MD as Consulting Physician (General Surgery) [name], MD as Consulting Physician (Hematology) [name], MD as Attending Physician (Radiation Oncology) [name], [name], NP as Nurse Practitioner (Nurse Practitioner)  Date of Service:  02/21/2024  CHIEF COMPLAINT: f/u of metastatic breast cancer  CURRENT THERAPY:  Xeloda , Tucatinib  and trastuzumab   Oncology History   Metastatic breast cancer to bone and brain -Diagnosed in 06/2022, ER-/PR-/HER2+ --PET scan from 08/03/2022 showed diffuse bone mets  -she underwent cervical laminectomy and fusion by Dr. Debby on 08/17/2022, biopsy confirmed metastatic breast cancer, ER/PR negative, HER2 positive. -due to cervical cord compression with myelopathy, she underwent a second cervical spine surgery on September 06, 2022, and completed inpatient rehabitation. -She has completed brain, cervical and lumbar spine radiation in Feb 2024 -She was recently hospitalized for altered mental status and confusion, probably secondary to medication -She started first line systemic chemotherapy with weekly Taxol  and trastuzumab /Perjeta  on 10/25/22. She has tolerated well so far and her pain has improved overall -PET scan from Jan 01, 2023 showed complete metabolic response, no hypermetabolic uptake in diffuse bone lesions, no other new lesions.  She is clinically also doing much better.  Given her excellent response to chemo, we will change her Taxol  to 2 weeks on and 1 week off -She Unfortunately developed more more brain metastasis on recent brain MRI. -I personally reviewed her restaging PET scan from April 13, 2023, which showed stable disease, no new lesions. -Due to her new brain metastasis, I changed her therapy to Tucatinib , capecitabine  and trastuzumab , for better  brain penetration. She started in late August 2024, tolerating well so far.  Her case was reviewed in CNS tumor board, Dr. Izell plan to hold on whole brain radiation since she is starting new Her2 antibody and monitor her brain mets closely.  -restaging MRI brian 08/30/2023 showed partial response, PET in 06/2023 showed no hypermetabolic lesions  -brain MRI 12/27/2023 showed progressive brain mets, she started whole brain RT on 5/19 and completed on 02/01/2024  -she is also on Zometa  every 3 months   Assessment & Plan Metastatic breast cancer Metastatic breast cancer with recent completion of brain radiation. PET scan shows well-managed disease with no new lesions in the bones, liver, or other organs. Current treatment regimen includes oral chemotherapy with Xeloda  and tucatinib , which is well-tolerated and preferred over intravenous chemotherapy. Radiation has addressed brain metastases, but weight loss and fatigue are present post-radiation. - Continue oral chemotherapy regimen with Xeloda  and tucatinib . - Refill Xeloda  prescription through [facility]. - Ensure tucatinib  refills are available at [facility]. - Order echocardiogram for late July or early August. - Encourage high protein, high calorie diet to address weight loss.  Radiation-induced alopecia Alopecia post-radiation, managed with the use of hats.  Radiation-induced nausea Nausea post-radiation, managed effectively with prescribed anti-nausea medication. - Ensure availability of anti-nausea medication as needed.  Radiation-induced dermatitis Radiation-induced dermatitis presenting as inflammation and bumps on the scalp, a common post-radiation effect.  Weight loss Weight loss of a few pounds post-brain radiation, considered normal due to potential impact on appetite and energy levels. - Encourage consumption of high protein, high calorie foods to mitigate weight loss.  Plan - I personally reviewed her restaging PET scan from February 19, 2024, no evidence of hypermetabolic metastasis or new lesions to my eyes,  formal report still pending.  - She has restarted Xarelto last week, will continue, along with Tucatinib  - Will proceed trastuzumab  today and continue every 3 weeks - Follow-up in 3 weeks   SUMMARY OF ONCOLOGIC HISTORY: Oncology History Overview Note  Cancer Staging Malignant neoplasm of lower-inner quadrant of left breast in female, estrogen receptor positive ([name]) Staging form: Breast, AJCC 8th Edition - Clinical stage from 01/16/2018: Stage IA (cT1c, cN0, cM0, G2, ER+, PR+, HER2-) - Signed by [name], MD on 01/23/2018 - Pathologic: Stage IA (pT1c, pN1, cM0, G1, ER+, PR+, HER2-) - Signed by [name], MD on 04/09/2018     Malignant neoplasm of lower-inner quadrant of left breast in female, estrogen receptor positive ([name])  01/15/2018 Mammogram   Diagnositc Mammogram 01/15/18  IMPRESSION: 1. Suspicious 1.2 x 1.4 x 1.3 cm mixed echogenicity mass left breast 7 o'clock position retroareolar location at the site of palpable concern.. 2. Indeterminate Within the left breast 7:30 o'clock retroareolar location, adjacent to the palpable mass, is a 0.5 x 0.4 x 0.5 cm oval circumscribed hypoechoic mass. 3. Indeterminate calcifications within the lateral left breast. Location of these calcifications is not definitely confirmed on the true lateral view.    01/16/2018 Initial Biopsy   Diagnosis 01/16/18 1. Breast, left, needle core biopsy, 7:30 o'clock (ribbon clip) - FIBROCYSTIC CHANGES WITH SCLEROSING ADENOSIS AND CALCIFICATIONS. - FIBROADENOMATOID CHANGE. - NO MALIGNANCY IDENTIFIED. 2. Breast, left, needle core biopsy, 7 o'clock position (coil clip) - INVASIVE MAMMARY CARCINOMA, MSBR GRADE I/II. - SEE MICROSCOPIC DESCRIPTION Microscopic Comment  ADDENDUM: Immunohistochemistry for E-Cadherin is strongly positive in the tumor consistent with ductal carcinoma. (JDP:ah 01/17/18)   01/16/2018 Receptors her2    Estrogen Receptor: 100%, POSITIVE, STRONG STAINING INTENSITY Progesterone Receptor: 50%, POSITIVE, STRONG STAINING INTENSITY Proliferation Marker Ki67: 20% HER2 Negative   01/16/2018 Cancer Staging   Staging form: Breast, AJCC 8th Edition - Clinical stage from 01/16/2018: Stage IA (cT1c, cN0, cM0, G2, ER+, PR+, HER2-) - Signed by [name], MD on 01/23/2018   01/22/2018 Initial Diagnosis   Malignant neoplasm of lower-inner quadrant of left breast in female, estrogen receptor positive ([name])   02/21/2018 Surgery    LEFT BREAST LUMPECTOMY WITH AXILLARY LYMPH NODE BIOPSY by Dr. Aron  02/21/18   02/21/2018 Pathology Results   Diagnosis 02/21/18 1. Breast, lumpectomy, Left - INVASIVE DUCTAL CARCINOMA, GRADE I, 1.6 CM. - DUCTAL CARCINOMA IN SITU, INTERMEDIATE NUCLEAR GRADE. - ANTERIOR AND MEDIAL RESECTION MARGINS ARE POSITIVE FOR CARCINOMA. - NEGATIVE FOR LYMPHOVASCULAR OR PERINEURAL INVASION. - BACKGROUND BREAST TISSUE WITH FIBROCYSTIC CHANGE, INCLUDING SCLEROSING ADENOSIS. - BIOPSY SITE CHANGES. - SEE ONCOLOGY TABLE. 2. Lymph node, sentinel, biopsy, Left Axillary #1 - METASTATIC BREAST CARCINOMA TO A LYMPH NODE, 1.0 CM IN GREATEST DIMENSION, WITH EXTRANODAL EXTENSION (1/1). 3. Lymph node, sentinel, biopsy, Left Axillary #2 - LYMPH NODE, NEGATIVE FOR CARCINOMA (0/1).    02/21/2018 Miscellaneous   Mammaprint 02/21/18 Low Risk with 10-year risk of recurrnce at 10% -No potential signifcant chemotherapy benefit   03/20/2018 Pathology Results   RE-EXCISION OF BREAST LUMPECTOMY by Dr. Aron  Diagnosis 03/20/18 1. Breast, excision, Left new anterior margin - FIBROCYSTIC CHANGES WITH ADENOSIS AND CALCIFICATIONS. - HEALING BIOPSY SITE. - THERE IS NO EVIDENCE OF MALIGNANCY. 2. Breast, excision, Left new medial margin - FIBROCYSTIC CHANGES WITH ADENOSIS AND CALCIFICATIONS. - HEALING BIOPSY SITE. - THERE IS NO EVIDENCE OF MALIGNANCY. Microscopic Comment 1. -2. The surgical resection margin(s)  of the specimen were inked and microscopically evaluated. (JBK:kh 03-22-18)  04/09/2018 Cancer Staging   Staging form: Breast, AJCC 8th Edition - Pathologic: Stage IA (pT1c, pN1, cM0, G1, ER+, PR+, HER2-) - Signed by [name], MD on 04/09/2018   04/22/2018 - 06/03/2018 Radiation Therapy   Radaiton with Dr. Izell 04/22/18-06/03/18   05/2018 -  Anti-estrogen oral therapy   Letrozole  2.5mg  started 05/2018    Survivorship   Per Mayme Silversmith, NP    05/04/2022 Imaging    IMPRESSION: Cervical spondylosis, as described.   Nonspecific straightening of the expected cervical lordosis.   07/24/2022 Imaging    IMPRESSION: 1. Extensive osseous metastatic disease with pathologic fracture at the base of dens and C2 right lateral mass. Extraosseous tumor at C1 and C2 likely impinging on the right C2 and C3 nerve roots. 2. Degenerative cord impingement at C4-5 to C6-7. Biforaminal impingement at C5-6 and C6-7.   08/03/2022 PET scan    IMPRESSION: 1. Large volume osseous metastasis. 2. Low right cervical and probable right axillary nodal metastasis. 3. Subtle heterogeneous activity throughout the liver with suggestion of small liver lesions (likely new compared to chest CT of 07/16/2020). Findings are overall moderately suspicious for hepatic metastasis. Pre and post contrast abdominal MRI (preferred) or CT could confirm. 4. Right-sided pleural thickening and trace pleural fluid. Right base airspace disease is favored to represent chronic atelectasis. 5. Aortic atherosclerosis (ICD10-I70.0) and emphysema (ICD10-J43.9).     08/11/2022 Genetic Testing   Negative genetic testing on the CancerNext-Expanded+RNAinsight panel.  The report date is August 11, 2022.  The CancerNext-Expanded gene panel offered by [facility] and includes sequencing and rearrangement analysis for the following 77 genes: AIP, ALK, APC*, ATM*, AXIN2, BAP1, BARD1, BLM, BMPR1A, BRCA1*, BRCA2*, BRIP1*, CDC73, CDH1*,  CDK4, CDKN1B, CDKN2A, CHEK2*, CTNNA1, DICER1, FANCC, FH, FLCN, GALNT12, KIF1B, LZTR1, MAX, MEN1, MET, MLH1*, MSH2*, MSH3, MSH6*, MUTYH*, NBN, NF1*, NF2, NTHL1, PALB2*, PHOX2B, PMS2*, POT1, PRKAR1A, PTCH1, PTEN*, RAD51C*, RAD51D*, RB1, RECQL, RET, SDHA, SDHAF2, SDHB, SDHC, SDHD, SMAD4, SMARCA4, SMARCB1, SMARCE1, STK11, SUFU, TMEM127, TP53*, TSC1, TSC2, VHL and XRCC2 (sequencing and deletion/duplication); EGFR, EGLN1, HOXB13, KIT, MITF, PDGFRA, POLD1, and POLE (sequencing only); EPCAM and GREM1 (deletion/duplication only). DNA and RNA analyses performed for * genes.    Lobular carcinoma in situ (LCIS) of right breast  01/20/2020 Mammogram   Diagnostic Mammogram 01/20/20 IMPRESSION: 1.  Stable post lumpectomy changes of the left breast.   2. Suspicious microcalcifications over the right upper outer quadrant spanning 3.6 cm.   02/02/2020 Initial Biopsy   Diagnosis 02/02/20 Breast, right, needle core biopsy, upper outer quadrant, x clip - LOBULAR CARCINOMA IN SITU WITH PLEOMORPHIC FEATURES AND CALCIFICATIONS, INVOLVING ADENOSIS. SEE NOTE Diagnosis Note Immunohistochemical stain for E-cadherin is negative in the lesional cells, consistent with a lobular phenotype. Immunostains for p63, SMM 1 and calponin do not show evidence of invasive carcinoma.    02/04/2020 Initial Diagnosis   Lobular carcinoma in situ (LCIS) of right breast   03/18/2020 Surgery   RIGHT BREAST LUMPECTOMY WITH RADIOACTIVE SEED LOCALIZATION by Dr Marla    03/18/2020 Pathology Results   FINAL MICROSCOPIC DIAGNOSIS:   A. BREAST, RIGHT, LUMPECTOMY:  - Pleomorphic lobular carcinoma in situ with calcifications and  underlying complex sclerosing lesion, adenosis and fibroadenomatoid  change.  - Margins of resection are not involved (Closest margins: < 1 mm,  anterior, posterior, inferior and medial).  - Biopsy site.    COMMENT:   P63, Calponin and SMM-1 demonstrate the presence of myoepithelium in the  select focus.     Metastatic  breast cancer to bone and brain  05/04/2022 Imaging    IMPRESSION: Cervical spondylosis, as described.   Nonspecific straightening of the expected cervical lordosis.   07/24/2022 Imaging    IMPRESSION: 1. Extensive osseous metastatic disease with pathologic fracture at the base of dens and C2 right lateral mass. Extraosseous tumor at C1 and C2 likely impinging on the right C2 and C3 nerve roots. 2. Degenerative cord impingement at C4-5 to C6-7. Biforaminal impingement at C5-6 and C6-7.   08/02/2022 Initial Diagnosis   Cancer, metastatic to bone ([name])   08/03/2022 PET scan    IMPRESSION: 1. Large volume osseous metastasis. 2. Low right cervical and probable right axillary nodal metastasis. 3. Subtle heterogeneous activity throughout the liver with suggestion of small liver lesions (likely new compared to chest CT of 07/16/2020). Findings are overall moderately suspicious for hepatic metastasis. Pre and post contrast abdominal MRI (preferred) or CT could confirm. 4. Right-sided pleural thickening and trace pleural fluid. Right base airspace disease is favored to represent chronic atelectasis. 5. Aortic atherosclerosis (ICD10-I70.0) and emphysema (ICD10-J43.9).     10/26/2022 - 04/12/2023 Chemotherapy   Patient is on Treatment Plan : BREAST Paclitaxel  D1,8,15 + Trastuzumab  D1 + Pertuzumab  D1 q21d x 8 cycles / Trastuzumab  D1 + Pertuzumab  D1 q21d x 4 cycles     05/03/2023 -  Chemotherapy   Patient is on Treatment Plan : BREAST MAINTENANCE Trastuzumab  IV (6) or SQ (600) D1 q21d X 11 Cycles        Discussed the use of AI scribe software for clinical note transcription with the patient, who gave verbal consent to proceed.  History of Present Illness Martha [name] is a 63 year old female with metastatic breast cancer who presents for follow-up.  She completed radiation therapy a week ago, experiencing increased fatigue, hair loss, and nausea. Nausea  medication provided significant relief. Her energy levels are currently stable.  She has lost two pounds since her last visit, now weighing 71 pounds. She is consuming more high-protein, high-calorie foods. No pain or breathing issues are present.  She started oral chemotherapy on June 16th and is on a break this week. She reports no issues with the chemotherapy regimen and finds it easier on her stomach compared to intravenous chemotherapy. She takes Xeloda  and tucatinib , with nausea pills and Lyrica  for symptom management, and occasionally uses medication for diarrhea.  She denies any new skin changes or breathing issues. She has noticed some bumps on her scalp, attributed to radiation-induced inflammation.     All other systems were reviewed with the patient and are negative.  MEDICAL HISTORY:  Past Medical History:  Diagnosis Date   Anxiety    Cancer ([name]) 2022   right breast LCIS   Cancer ([name]) 2019   left breast   Depression    Dysrhythmia    SVT, s/p ablation ~ 2012 in at [facility]   Ectopic pregnancy    Family history of breast cancer    Family history of melanoma    History of radiation therapy 04/22/18-06/03/18   Left Breast, left SCV, axilla 50 Gy in 25 fractions, Left breast boost 10 Gy in 5 fractions.    Hypothyroidism    Personal history of radiation therapy    PONV (postoperative nausea and vomiting)    Thyroid  disease     SURGICAL HISTORY: Past Surgical History:  Procedure Laterality Date   APPENDECTOMY     BILATERAL SALPINGECTOMY     BREAST BIOPSY Right  2014   fibroadenoma   BREAST BIOPSY Left 01/16/2018   BREAST BIOPSY Right 02/02/2020   LCIS   BREAST BIOPSY Right 08/04/2020   x2 LCIS   BREAST BIOPSY Right 08/13/2020   x2   BREAST EXCISIONAL BIOPSY Left 1990   benign   BREAST EXCISIONAL BIOPSY Right 02/02/2020   LCIS   BREAST LUMPECTOMY Left 01/22/2018   BREAST LUMPECTOMY WITH AXILLARY LYMPH NODE BIOPSY Left 02/21/2018   Procedure:  LEFT BREAST LUMPECTOMY WITH AXILLARY LYMPH NODE BIOPSY;  Surgeon: [name], MD;  Location: [facility] OR;  Service: General;  Laterality: Left;   BREAST LUMPECTOMY WITH RADIOACTIVE SEED LOCALIZATION Right 03/18/2020   Procedure: RIGHT BREAST LUMPECTOMY WITH RADIOACTIVE SEED LOCALIZATION;  Surgeon: [name], MD;  Location: [facility] OR;  Service: General;  Laterality: Right;   BREAST SURGERY Left 1993   cyst removed   ENDOMETRIAL ABLATION     EYE SURGERY     GLAUCOMA SURGERY Bilateral    IR IMAGING GUIDED PORT INSERTION  10/16/2022   POSTERIOR CERVICAL FUSION/FORAMINOTOMY N/A 08/17/2022   Procedure: Cervical One-Cervical Four POSTERIOR CERVICAL FUSION,  Cervical One LAMINECTOMY REDUCTION OF Cervical Two Fracture, Biopsy of Right Cerical Two Pars  Lesion;  Surgeon: Debby Dorn MATSU, MD;  Location: [facility] OR;  Service: Neurosurgery;  Laterality: N/A;   POSTERIOR CERVICAL FUSION/FORAMINOTOMY N/A 09/06/2022   Procedure: Posterior Cervical Fusion, Foraminotomy , Cervical Five-Six, Cervical Six-Seven; Extension of  fusion Cervical Four- Thoracic One;  Surgeon: Debby Dorn MATSU, MD;  Location: [facility] OR;  Service: Neurosurgery;  Laterality: N/A;   RADIOACTIVE SEED GUIDED EXCISIONAL BREAST BIOPSY Right 04/28/2021   Procedure: RADIOACTIVE SEED GUIDED EXCISIONAL RIGHT BREAST BIOPSY X2;  Surgeon: [name], MD;  Location: [facility];  Service: General;  Laterality: Right;   RE-EXCISION OF BREAST LUMPECTOMY Left 03/20/2018   Procedure: RE-EXCISION OF BREAST LUMPECTOMY;  Surgeon: [name], MD;  Location: [facility];  Service: General;  Laterality: Left;    I have reviewed the social history and family history with the patient and they are unchanged from previous note.  ALLERGIES:  is allergic to morphine and codeine.  MEDICATIONS:  Current Outpatient Medications  Medication Sig Dispense Refill   acetaminophen  (TYLENOL ) 500 MG tablet Take 2 tablets (1,000 mg total) by mouth  every 8 (eight) hours. 30 tablet 0   capecitabine  (XELODA ) 500 MG tablet TAKE 2 TABLETS TWICE DAILY FOR 7 DAYS ON THEN 7 DAYS OFF. TAKE AFTER MEALS 56 tablet 0   citalopram  (CELEXA ) 40 MG tablet TAKE 1 TABLET BY MOUTH EVERY DAY 90 tablet 2   diphenoxylate -atropine  (LOMOTIL ) 2.5-0.025 MG tablet Take 1 tablet by mouth 4 (four) times daily as needed for diarrhea or loose stools. 90 tablet 3   levothyroxine  (SYNTHROID ) 75 MCG tablet Take 1 tablet (75 mcg total) by mouth daily before breakfast. 90 tablet 3   loperamide  (IMODIUM  A-D) 2 MG tablet Take 1 tablet (2 mg total) by mouth 4 (four) times daily as needed for diarrhea or loose stools. 90 tablet 0   Mouthwashes (MOUTH RINSE) LIQD solution 15 mLs by Mouth Rinse route as needed (for oral care).  0   nicotine  (NICODERM CQ  - DOSED IN MG/24 HR) 7 mg/24hr patch Place 1 patch (7 mg total) onto the skin daily. 28 patch 0   ondansetron  (ZOFRAN ) 8 MG tablet Take 1 tablet (8 mg total) by mouth every 8 (eight) hours as needed for nausea or vomiting. 20 tablet 2   OVER THE COUNTER  MEDICATION Take 1 tablet by mouth daily. Protandim Supplement     pregabalin  (LYRICA ) 25 MG capsule Take 1 capsule (25 mg total) by mouth 3 (three) times daily. 90 capsule 3   tucatinib  (TUKYSA ) 150 MG tablet Take 2 tablets (300 mg total) by mouth 2 (two) times daily. Take as instructed per MD. 120 tablet 2   No current facility-administered medications for this visit.    PHYSICAL EXAMINATION: ECOG PERFORMANCE STATUS: 2 - Symptomatic, <50% confined to bed  Vitals:   02/21/24 1056  BP: (!) 118/92  Pulse: 89  Resp: 16  Temp: 97.6 F (36.4 C)  SpO2: 97%   Wt Readings from Last 3 Encounters:  02/21/24 71 lb 9.6 oz (32.5 kg)  01/31/24 73 lb 11.2 oz (33.4 kg)  01/24/24 74 lb 14.4 oz (34 kg)     GENERAL:alert, no distress and comfortable SKIN: skin color, texture, turgor are normal, no rashes or significant lesions EYES: normal, Conjunctiva are pink and non-injected, sclera  clear Musculoskeletal:no cyanosis of digits and no clubbing  NEURO: alert & oriented x 3 with fluent speech, no focal motor/sensory deficits  Physical Exam   LABORATORY DATA:  I have reviewed the data as listed    Latest Ref Rng & Units 02/21/2024   10:41 AM 01/31/2024    9:54 AM 01/10/2024   10:39 AM  CBC  WBC 4.0 - 10.5 K/uL 5.6  5.3  6.4   Hemoglobin 12.0 - 15.0 g/dL 84.1  84.9  85.0   Hematocrit 36.0 - 46.0 % 44.0  43.4  42.0   Platelets 150 - 400 K/uL 188  158  168         Latest Ref Rng & Units 02/21/2024   10:41 AM 01/31/2024    9:54 AM 01/10/2024   10:39 AM  CMP  Glucose 70 - 99 mg/dL 891  898  91   BUN 8 - 23 mg/dL 9  11  11    Creatinine 0.44 - 1.00 mg/dL 9.50  9.43  9.47   Sodium 135 - 145 mmol/L 136  137  137   Potassium 3.5 - 5.1 mmol/L 3.8  4.2  4.1   Chloride 98 - 111 mmol/L 99  102  101   CO2 22 - 32 mmol/L 32  31  32   Calcium 8.9 - 10.3 mg/dL 9.5  9.2  9.3   Total Protein 6.5 - 8.1 g/dL 6.8  7.0  6.8   Total Bilirubin 0.0 - 1.2 mg/dL 0.8  1.0  0.8   Alkaline Phos 38 - 126 U/L 64  109  77   AST 15 - 41 U/L 13  20  15    ALT 0 - 44 U/L 7  29  10        RADIOGRAPHIC STUDIES: I have personally reviewed the radiological images as listed and agreed with the findings in the report. No results found.    Orders Placed This Encounter  Procedures   ECHOCARDIOGRAM COMPLETE    Standing Status:   Future    Expected Date:   03/22/2024    Expiration Date:   02/20/2025    Where should this test be performed:   [place]    Perflutren DEFINITY (image enhancing agent) should be administered unless hypersensitivity or allergy exist:   Administer Perflutren    Reason for exam-Echo:   Chemo  Z09   All questions were answered. The patient knows to call the clinic with any problems, questions or concerns. No barriers  to learning was detected. The total time spent in the appointment was 30 minutes, including review of chart and various tests results, discussions about plan  of care and coordination of care plan     Onita Mattock, MD 02/21/2024

## 2024-02-27 NOTE — Progress Notes (Signed)
 Ms. Baldonado presents today for a telephone follow. She completed radiation treatment for Secondary Malignant Neoplasm of the Brain on 02/01/2024. Patient is doing well post radiation treatment. Recent neurologic symptoms, if any:  Seizures: None Headaches: None Nausea: None Dizziness/ataxia: None Difficulty with hand coordination: None Focal numbness/weakness: None Visual deficits/changes: None Confusion/Memory deficits: None  Other issues of note:  None

## 2024-02-28 ENCOUNTER — Ambulatory Visit
Admission: RE | Admit: 2024-02-28 | Discharge: 2024-02-28 | Disposition: A | Discharge status: Home / Self Care | Source: Ambulatory Visit | Attending: Radiation Oncology | Admitting: Radiation Oncology

## 2024-02-28 DIAGNOSIS — C7951 Secondary malignant neoplasm of bone: Secondary | ICD-10-CM

## 2024-03-05 NOTE — Assessment & Plan Note (Deleted)
-  Diagnosed in 06/2022, ER-/PR-/HER2+ --PET scan from 08/03/2022 showed diffuse bone mets  -she underwent cervical laminectomy and fusion by Dr. Debby on 08/17/2022, biopsy confirmed metastatic breast cancer, ER/PR negative, HER2 positive. -due to cervical cord compression with myelopathy, she underwent a second cervical spine surgery on September 06, 2022, and completed inpatient rehabitation. -She has completed brain, cervical and lumbar spine radiation in Feb 2024 -She was recently hospitalized for altered mental status and confusion, probably secondary to medication -She started first line systemic chemotherapy with weekly Taxol  and trastuzumab /Perjeta  on 10/25/22. She has tolerated well so far and her pain has improved overall -PET scan from Jan 01, 2023 showed complete metabolic response, no hypermetabolic uptake in diffuse bone lesions, no other new lesions.  She is clinically also doing much better.  Given her excellent response to chemo, we will change her Taxol  to 2 weeks on and 1 week off -She Unfortunately developed more more brain metastasis on recent brain MRI. -I personally reviewed her restaging PET scan from April 13, 2023, which showed stable disease, no new lesions. -Due to her new brain metastasis, I changed her therapy to Tucatinib , capecitabine  and trastuzumab , for better brain penetration. She started in late August 2024, tolerating well so far.  Her case was reviewed in CNS tumor board, Dr. Izell plan to hold on whole brain radiation since she is starting new Her2 antibody and monitor her brain mets closely.  -restaging MRI brian 08/30/2023 showed partial response, PET in 06/2023 showed no hypermetabolic lesions  -brain MRI 12/27/2023 showed progressive brain mets, she started whole brain RT on 5/19 and completed on 02/01/2024  -she is also on Zometa  every 3 months -continue Tucatinib , capecitabine  and trastuzumab

## 2024-03-06 ENCOUNTER — Inpatient Hospital Stay: Attending: Genetic Counselor

## 2024-03-06 ENCOUNTER — Other Ambulatory Visit: Payer: Self-pay

## 2024-03-06 ENCOUNTER — Telehealth: Payer: Self-pay | Admitting: Hematology

## 2024-03-06 ENCOUNTER — Inpatient Hospital Stay: Admitting: Hematology

## 2024-03-06 DIAGNOSIS — F32A Depression, unspecified: Secondary | ICD-10-CM

## 2024-03-06 DIAGNOSIS — Z515 Encounter for palliative care: Secondary | ICD-10-CM

## 2024-03-06 DIAGNOSIS — C7951 Secondary malignant neoplasm of bone: Secondary | ICD-10-CM

## 2024-03-06 DIAGNOSIS — M792 Neuralgia and neuritis, unspecified: Secondary | ICD-10-CM

## 2024-03-06 MED ORDER — CITALOPRAM HYDROBROMIDE 40 MG PO TABS: 40.0000 mg | ORAL_TABLET | Freq: Every day | ORAL | 0 refills | Status: DC

## 2024-03-06 NOTE — Telephone Encounter (Signed)
 Canceled appointments per /10 secure chat. Talked with the patient and she is aware of the changes made to her upcoming appointments.

## 2024-03-11 ENCOUNTER — Encounter: Payer: Self-pay | Admitting: Nurse Practitioner

## 2024-03-11 ENCOUNTER — Inpatient Hospital Stay: Attending: Genetic Counselor | Admitting: Nurse Practitioner

## 2024-03-11 DIAGNOSIS — Z79899 Other long term (current) drug therapy: Secondary | ICD-10-CM | POA: Insufficient documentation

## 2024-03-11 DIAGNOSIS — Z170 Estrogen receptor positive status [ER+]: Secondary | ICD-10-CM | POA: Insufficient documentation

## 2024-03-11 DIAGNOSIS — M792 Neuralgia and neuritis, unspecified: Secondary | ICD-10-CM

## 2024-03-11 DIAGNOSIS — R530 Neoplastic (malignant) related fatigue: Secondary | ICD-10-CM

## 2024-03-11 DIAGNOSIS — Z515 Encounter for palliative care: Secondary | ICD-10-CM

## 2024-03-11 DIAGNOSIS — Z5112 Encounter for antineoplastic immunotherapy: Secondary | ICD-10-CM | POA: Insufficient documentation

## 2024-03-11 DIAGNOSIS — C7931 Secondary malignant neoplasm of brain: Secondary | ICD-10-CM | POA: Insufficient documentation

## 2024-03-11 DIAGNOSIS — C7951 Secondary malignant neoplasm of bone: Secondary | ICD-10-CM | POA: Insufficient documentation

## 2024-03-11 DIAGNOSIS — C50312 Malignant neoplasm of lower-inner quadrant of left female breast: Secondary | ICD-10-CM | POA: Insufficient documentation

## 2024-03-11 DIAGNOSIS — Z923 Personal history of irradiation: Secondary | ICD-10-CM | POA: Insufficient documentation

## 2024-03-11 NOTE — Progress Notes (Signed)
 Palliative Medicine [facility]  Telephone:(336) [phone] Fax:(336) [phone]   Name: Martha [name] Date: 03/11/2024 MRN: [id number]  DOB: [birth year]  Patient Care Team: [name], MD as PCP - General (Family Medicine) [name], MD as Consulting Physician (General Surgery) [name], MD as Consulting Physician (Hematology) Martha Domino, MD as Attending Physician (Radiation Oncology) [name], [name], NP as Nurse Practitioner (Nurse Practitioner)   I connected with Martha [name] on 03/11/24 at 10:30 AM EDT by telephone and verified that I am speaking with the correct person using two identifiers.   I discussed the limitations, risks, security and privacy concerns of performing an evaluation and management service by telemedicine and the availability of in-person appointments. I also discussed with the patient that there may be a patient responsible charge related to this service. The patient expressed understanding and agreed to proceed.   Other persons participating in the visit and their role in the encounter: N/A   Patient's location: Home   Provider's location: [facility]   INTERVAL HISTORY: Martha [name] is a 63 y.o. female with oncologic medical history including ER+ breast cancer (01/2018), right breast cancer (04/2021), now with metastatic disease progression involving brain and liver, pathological fractures with osseous mets s/p brain radiation. .  Palliative ask to see for symptom management and goals of care.   SOCIAL HISTORY:     reports that she has been smoking cigarettes. She has a 20 pack-year smoking history. She has never used smokeless tobacco. She reports that she does not currently use alcohol after a past usage of about 28.0 standard drinks of alcohol per week. She reports that she does not use drugs.  ADVANCE DIRECTIVES:  On file   CODE STATUS: DNR  PAST MEDICAL HISTORY: Past Medical History:  Diagnosis Date    Anxiety    Cancer ([name]) 2022   right breast LCIS   Cancer ([name]) 2019   left breast   Depression    Dysrhythmia    SVT, s/p ablation ~ 2012 in at [facility]   Ectopic pregnancy    Family history of breast cancer    Family history of melanoma    History of radiation therapy 04/22/18-06/03/18   Left Breast, left SCV, axilla 50 Gy in 25 fractions, Left breast boost 10 Gy in 5 fractions.    Hypothyroidism    Personal history of radiation therapy    PONV (postoperative nausea and vomiting)    Thyroid  disease     ALLERGIES:  is allergic to morphine and codeine.  MEDICATIONS:  Current Outpatient Medications  Medication Sig Dispense Refill   acetaminophen  (TYLENOL ) 500 MG tablet Take 2 tablets (1,000 mg total) by mouth every 8 (eight) hours. 30 tablet 0   capecitabine  (XELODA ) 500 MG tablet TAKE 2 TABLETS TWICE DAILY FOR 7 DAYS ON THEN 7 DAYS OFF. TAKE AFTER MEALS 56 tablet 0   citalopram  (CELEXA ) 40 MG tablet Take 1 tablet (40 mg total) by mouth daily. 90 tablet 0   diphenoxylate -atropine  (LOMOTIL ) 2.5-0.025 MG tablet Take 1 tablet by mouth 4 (four) times daily as needed for diarrhea or loose stools. 90 tablet 3   levothyroxine  (SYNTHROID ) 75 MCG tablet Take 1 tablet (75 mcg total) by mouth daily before breakfast. 90 tablet 3   loperamide  (IMODIUM  A-D) 2 MG tablet Take 1 tablet (2 mg total) by mouth 4 (four) times daily as needed for diarrhea or loose stools. 90 tablet 0   Mouthwashes (MOUTH  RINSE) LIQD solution 15 mLs by Mouth Rinse route as needed (for oral care).  0   nicotine  (NICODERM CQ  - DOSED IN MG/24 HR) 7 mg/24hr patch Place 1 patch (7 mg total) onto the skin daily. 28 patch 0   ondansetron  (ZOFRAN ) 8 MG tablet Take 1 tablet (8 mg total) by mouth every 8 (eight) hours as needed for nausea or vomiting. 20 tablet 2   OVER THE COUNTER MEDICATION Take 1 tablet by mouth daily. Protandim Supplement     pregabalin  (LYRICA ) 25 MG capsule Take 1 capsule (25 mg total) by mouth 3  (three) times daily. 90 capsule 3   tucatinib  (TUKYSA ) 150 MG tablet Take 2 tablets (300 mg total) by mouth 2 (two) times daily. Take as instructed per MD. 120 tablet 2   No current facility-administered medications for this visit.    VITAL SIGNS: There were no vitals taken for this visit. There were no vitals filed for this visit.   Estimated body mass index is 13.1 kg/m as calculated from the following:   Height as of 02/21/24: 5' 2 (1.575 m).   Weight as of 02/21/24: 71 lb 9.6 oz (32.5 kg).   PERFORMANCE STATUS (ECOG) : 1 - Symptomatic but completely ambulatory   Physical Exam General: NAD Cardiovascular: regular rate and rhythm Pulmonary: normal breathing pattern Extremities: no edema, no joint deformities Skin: no rashes Neurological: AAO x3  IMPRESSION: Discussed the use of AI scribe software for clinical note transcription with the patient, who gave verbal consent to proceed.  History of Present Illness Martha [name] is a 63 year old female with metastatic cancer who I connected with by phone for follow-up. She is doing well overall. Denies concerns for nausea, vomiting, constipation, or diarrhea. Patient shares recent family trip to the mountains which she enjoyed.  She is celebrating her 63rd birthday on tomorrow and looking forward to having a nice intimate family dinner.  Occasional fatigue however she is remaining as active as possible. Pain and neuropathic symptoms are well controlled on Pregabalin . No adjustments to regimen at this time.   All symptoms well managed. All questions answered and support provided.   I discussed the importance of continued conversation with family and their medical providers regarding overall plan of care and treatment options, ensuring decisions are within the context of the patients values and GOCs. Assessment & Plan  Neuropathy Pregabalin  dosing tolerated. Discomfort much improved. - Continue pregabalin  dosage to one  capsule (25mg ) three times a day.  Quality of Life QOL much improved. Decreased symptom burden.   I will see patient back in 3-4 weeks. Sooner if needed.   Patient expressed understanding and was in agreement with this plan. She also understands that She can call the clinic at any time with any questions, concerns, or complaints.   Any controlled substances utilized were prescribed in the context of palliative care. PDMP has been reviewed.   Visit consisted of counseling and education dealing with the complex and emotionally intense issues of symptom management and palliative care in the setting of serious and potentially life-threatening illness.  Levon Borer, AGPCNP-BC  Palliative Medicine Team/[facility]

## 2024-03-12 NOTE — Assessment & Plan Note (Signed)
 She was diagnosed in 12/2017. ER+/PR+/HER2- -She is s/p left breast lumpectomy and adjuvant radiation.  -She started anti-estrogen therapy with letrozole on 05/2018. Tolerating well with no issues -lost f/u after visit in 04/2020 until her recurrence in 07/2022

## 2024-03-12 NOTE — Assessment & Plan Note (Signed)
-  Diagnosed in 06/2022, ER-/PR-/HER2+ --PET scan from 08/03/2022 showed diffuse bone mets  -she underwent cervical laminectomy and fusion by Dr. Debby on 08/17/2022, biopsy confirmed metastatic breast cancer, ER/PR negative, HER2 positive. -due to cervical cord compression with myelopathy, she underwent a second cervical spine surgery on September 06, 2022, and completed inpatient rehabitation. -She has completed brain, cervical and lumbar spine radiation in Feb 2024 -She was recently hospitalized for altered mental status and confusion, probably secondary to medication -She started first line systemic chemotherapy with weekly Taxol  and trastuzumab /Perjeta  on 10/25/22. She has tolerated well so far and her pain has improved overall -PET scan from Jan 01, 2023 showed complete metabolic response, no hypermetabolic uptake in diffuse bone lesions, no other new lesions.  She is clinically also doing much better.  Given her excellent response to chemo, we will change her Taxol  to 2 weeks on and 1 week off -She Unfortunately developed more more brain metastasis on recent brain MRI. -I personally reviewed her restaging PET scan from April 13, 2023, which showed stable disease, no new lesions. -Due to her new brain metastasis, I changed her therapy to Tucatinib , capecitabine  and trastuzumab , for better brain penetration. She started in late August 2024, tolerating well so far.  Her case was reviewed in CNS tumor board, Dr. Izell plan to hold on whole brain radiation since she is starting new Her2 antibody and monitor her brain mets closely.  -restaging MRI brian 08/30/2023 showed partial response, PET in 06/2023 showed no hypermetabolic lesions  -brain MRI 12/27/2023 showed progressive brain mets, she started whole brain RT on 5/19 and completed on 02/01/2024  -she is also on Zometa  every 3 months -continue Tucatinib , capecitabine  and trastuzumab

## 2024-03-13 ENCOUNTER — Inpatient Hospital Stay

## 2024-03-13 ENCOUNTER — Inpatient Hospital Stay (HOSPITAL_BASED_OUTPATIENT_CLINIC_OR_DEPARTMENT_OTHER): Admitting: Hematology

## 2024-03-13 ENCOUNTER — Encounter

## 2024-03-13 VITALS — BP 100/62 | HR 87 | Temp 98.3°F | Resp 17 | Wt 73.0 lb

## 2024-03-13 VITALS — BP 131/88 | HR 71 | Temp 97.9°F | Resp 16

## 2024-03-13 DIAGNOSIS — C7951 Secondary malignant neoplasm of bone: Secondary | ICD-10-CM

## 2024-03-13 DIAGNOSIS — C50312 Malignant neoplasm of lower-inner quadrant of left female breast: Secondary | ICD-10-CM | POA: Diagnosis not present

## 2024-03-13 DIAGNOSIS — C7931 Secondary malignant neoplasm of brain: Secondary | ICD-10-CM | POA: Diagnosis not present

## 2024-03-13 DIAGNOSIS — Z923 Personal history of irradiation: Secondary | ICD-10-CM | POA: Diagnosis not present

## 2024-03-13 DIAGNOSIS — Z95828 Presence of other vascular implants and grafts: Secondary | ICD-10-CM

## 2024-03-13 DIAGNOSIS — Z170 Estrogen receptor positive status [ER+]: Secondary | ICD-10-CM | POA: Diagnosis not present

## 2024-03-13 DIAGNOSIS — Z5112 Encounter for antineoplastic immunotherapy: Secondary | ICD-10-CM | POA: Diagnosis not present

## 2024-03-13 DIAGNOSIS — Z79899 Other long term (current) drug therapy: Secondary | ICD-10-CM | POA: Diagnosis not present

## 2024-03-13 LAB — CBC WITH DIFFERENTIAL (CANCER CENTER ONLY)
Abs Immature Granulocytes: 0.03 K/uL (ref 0.00–0.07)
Basophils Absolute: 0 K/uL (ref 0.0–0.1)
Basophils Relative: 1 %
Eosinophils Absolute: 0.1 K/uL (ref 0.0–0.5)
Eosinophils Relative: 1 %
HCT: 42.8 % (ref 36.0–46.0)
Hemoglobin: 15.3 g/dL — ABNORMAL HIGH (ref 12.0–15.0)
Immature Granulocytes: 1 %
Lymphocytes Relative: 13 %
Lymphs Abs: 0.8 K/uL (ref 0.7–4.0)
MCH: 35.2 pg — ABNORMAL HIGH (ref 26.0–34.0)
MCHC: 35.7 g/dL (ref 30.0–36.0)
MCV: 98.4 fL (ref 80.0–100.0)
Monocytes Absolute: 0.4 K/uL (ref 0.1–1.0)
Monocytes Relative: 8 %
Neutro Abs: 4.4 K/uL (ref 1.7–7.7)
Neutrophils Relative %: 76 %
Platelet Count: 169 K/uL (ref 150–400)
RBC: 4.35 MIL/uL (ref 3.87–5.11)
RDW: 14.9 % (ref 11.5–15.5)
WBC Count: 5.7 K/uL (ref 4.0–10.5)
nRBC: 0 % (ref 0.0–0.2)

## 2024-03-13 LAB — CMP (CANCER CENTER ONLY)
ALT: 15 U/L (ref 0–44)
AST: 17 U/L (ref 15–41)
Albumin: 4.3 g/dL (ref 3.5–5.0)
Alkaline Phosphatase: 78 U/L (ref 38–126)
Anion gap: 3 — ABNORMAL LOW (ref 5–15)
BUN: 10 mg/dL (ref 8–23)
CO2: 33 mmol/L — ABNORMAL HIGH (ref 22–32)
Calcium: 9.3 mg/dL (ref 8.9–10.3)
Chloride: 102 mmol/L (ref 98–111)
Creatinine: 0.48 mg/dL (ref 0.44–1.00)
GFR, Estimated: >60 mL/min (ref >60)
Glucose, Bld: 80 mg/dL (ref 70–99)
Potassium: 3.9 mmol/L (ref 3.5–5.1)
Sodium: 138 mmol/L (ref 135–145)
Total Bilirubin: 0.6 mg/dL (ref 0.0–1.2)
Total Protein: 6.5 g/dL (ref 6.5–8.1)

## 2024-03-13 MED ORDER — ZOLEDRONIC ACID 4 MG/100ML IV SOLN
4.0000 mg | Freq: Once | INTRAVENOUS | Status: AC
  Administered 2024-03-13: 4 mg via INTRAVENOUS
  Filled 2024-03-13: qty 100

## 2024-03-13 MED ORDER — TRASTUZUMAB-ANNS CHEMO 150 MG IV SOLR
6.0000 mg/kg | Freq: Once | INTRAVENOUS | Status: AC
  Administered 2024-03-13: 210 mg via INTRAVENOUS
  Filled 2024-03-13: qty 10

## 2024-03-13 MED ORDER — SODIUM CHLORIDE 0.9 % IV SOLN
Freq: Once | INTRAVENOUS | Status: DC
Start: 2024-03-13 — End: 2024-03-13

## 2024-03-13 MED ORDER — SODIUM CHLORIDE 0.9% FLUSH
10.0000 mL | INTRAVENOUS | Status: DC | PRN
Start: 2024-03-13 — End: 2024-03-13
  Administered 2024-03-13: 10 mL

## 2024-03-13 MED ORDER — HEPARIN SOD (PORK) LOCK FLUSH 100 UNIT/ML IV SOLN
500.0000 [IU] | Freq: Once | INTRAVENOUS | Status: AC | PRN
  Administered 2024-03-13: 500 [IU]

## 2024-03-13 MED ORDER — SODIUM CHLORIDE 0.9 % IV SOLN
Freq: Once | INTRAVENOUS | Status: AC
Start: 2024-03-13 — End: 2024-03-13

## 2024-03-13 MED ORDER — SODIUM CHLORIDE 0.9% FLUSH
10.0000 mL | Freq: Once | INTRAVENOUS | Status: AC
Start: 2024-03-13 — End: 2024-03-13
  Administered 2024-03-13: 10 mL

## 2024-03-13 MED ORDER — ACETAMINOPHEN 325 MG PO TABS
650.0000 mg | ORAL_TABLET | Freq: Once | ORAL | Status: AC
Start: 2024-03-13 — End: 2024-03-13
  Administered 2024-03-13: 650 mg via ORAL
  Filled 2024-03-13: qty 2

## 2024-03-13 NOTE — Progress Notes (Signed)
 [facility]   Telephone:([phone]) [phone] Fax:([phone]) [phone]   Clinic Follow up Note   Patient Care Team: [name], MD as PCP - General (Family Medicine) [name], MD as Consulting Physician (General Surgery) [name], MD as Consulting Physician (Hematology) [name], MD as Attending Physician (Radiation Oncology) [name], [name], NP as Nurse Practitioner (Nurse Practitioner)  Date of Service:  03/13/2024  CHIEF COMPLAINT: f/u of metastatic breast cancer  CURRENT THERAPY:  Xeloda , Tucatinib  and trastuzumab    Oncology History   Metastatic breast cancer to bone and brain -Diagnosed in 06/2022, ER-/PR-/HER2+ --PET scan from 08/03/2022 showed diffuse bone mets  -she underwent cervical laminectomy and fusion by Dr. Debby on 08/17/2022, biopsy confirmed metastatic breast cancer, ER/PR negative, HER2 positive. -due to cervical cord compression with myelopathy, she underwent a second cervical spine surgery on September 06, 2022, and completed inpatient rehabitation. -She has completed brain, cervical and lumbar spine radiation in Feb 2024 -She was recently hospitalized for altered mental status and confusion, probably secondary to medication -She started first line systemic chemotherapy with weekly Taxol  and trastuzumab /Perjeta  on 10/25/22. She has tolerated well so far and her pain has improved overall -PET scan from Jan 01, 2023 showed complete metabolic response, no hypermetabolic uptake in diffuse bone lesions, no other new lesions.  She is clinically also doing much better.  Given her excellent response to chemo, we will change her Taxol  to 2 weeks on and 1 week off -She Unfortunately developed more more brain metastasis on recent brain MRI. -I personally reviewed her restaging PET scan from April 13, 2023, which showed stable disease, no new lesions. -Due to her new brain metastasis, I changed her therapy to Tucatinib , capecitabine  and trastuzumab , for better  brain penetration. She started in late August 2024, tolerating well so far.  Her case was reviewed in CNS tumor board, Dr. Izell plan to hold on whole brain radiation since she is starting new Her2 antibody and monitor her brain mets closely.  -restaging MRI brian 08/30/2023 showed partial response, PET in 06/2023 showed no hypermetabolic lesions  -brain MRI 12/27/2023 showed progressive brain mets, she started whole brain RT on 5/19 and completed on 02/01/2024  -she is also on Zometa  every 3 months -continue Tucatinib , capecitabine  and trastuzumab     Malignant neoplasm of lower-inner quadrant of left breast in female, estrogen receptor positive ([name]) She was diagnosed in 12/2017. ER+/PR+/HER2- -She is s/p left breast lumpectomy and adjuvant radiation.  -She started anti-estrogen therapy with letrozole  on 05/2018. Tolerating well with no issues -lost f/u after visit in 04/2020 until her recurrence in 07/2022    Assessment & Plan Metastatic breast cancer Undergoing treatment with oral chemotherapy and antibody therapy. Reports no new symptoms. Diarrhea is under control. Slight weight increase and eating well. No adverse reactions to current treatment regimen, including no headaches or issues from previous radiation therapy. Requested to reduce premedication Benadryl  due to drowsiness, especially since she drives herself to appointments. - Remove Benadryl  from premedication regimen. - Continue Tylenol  as part of the premedication regimen. - Ensure appointments are scheduled for August 7th and August 28th. - Proceed with today's infusion scheduled at 11:45 AM.  PLAN - Reviewed, adequate for treatment, will proceed trastuzumab  today and continue every 3 weeks - Continue Xeloda  and tucatinib  at home - Follow-up in 3 weeks   SUMMARY OF ONCOLOGIC HISTORY: Oncology History Overview Note  Cancer Staging Malignant neoplasm of lower-inner quadrant of left breast in female, estrogen receptor positive  (  [name]) Staging form: Breast, AJCC 8th Edition - Clinical stage from 01/16/2018: Stage IA (cT1c, cN0, cM0, G2, ER+, PR+, HER2-) - Signed by [name], MD on 01/23/2018 - Pathologic: Stage IA (pT1c, pN1, cM0, G1, ER+, PR+, HER2-) - Signed by [name], MD on 04/09/2018     Malignant neoplasm of lower-inner quadrant of left breast in female, estrogen receptor positive ([name])  01/15/2018 Mammogram   Diagnositc Mammogram 01/15/18  IMPRESSION: 1. Suspicious 1.2 x 1.4 x 1.3 cm mixed echogenicity mass left breast 7 o'clock position retroareolar location at the site of palpable concern.. 2. Indeterminate Within the left breast 7:30 o'clock retroareolar location, adjacent to the palpable mass, is a 0.5 x 0.4 x 0.5 cm oval circumscribed hypoechoic mass. 3. Indeterminate calcifications within the lateral left breast. Location of these calcifications is not definitely confirmed on the true lateral view.    01/16/2018 Initial Biopsy   Diagnosis 01/16/18 1. Breast, left, needle core biopsy, 7:30 o'clock (ribbon clip) - FIBROCYSTIC CHANGES WITH SCLEROSING ADENOSIS AND CALCIFICATIONS. - FIBROADENOMATOID CHANGE. - NO MALIGNANCY IDENTIFIED. 2. Breast, left, needle core biopsy, 7 o'clock position (coil clip) - INVASIVE MAMMARY CARCINOMA, MSBR GRADE I/II. - SEE MICROSCOPIC DESCRIPTION Microscopic Comment  ADDENDUM: Immunohistochemistry for E-Cadherin is strongly positive in the tumor consistent with ductal carcinoma. (JDP:ah 01/17/18)   01/16/2018 Receptors her2   Estrogen Receptor: 100%, POSITIVE, STRONG STAINING INTENSITY Progesterone Receptor: 50%, POSITIVE, STRONG STAINING INTENSITY Proliferation Marker Ki67: 20% HER2 Negative   01/16/2018 Cancer Staging   Staging form: Breast, AJCC 8th Edition - Clinical stage from 01/16/2018: Stage IA (cT1c, cN0, cM0, G2, ER+, PR+, HER2-) - Signed by [name], MD on 01/23/2018   01/22/2018 Initial Diagnosis   Malignant neoplasm of lower-inner quadrant of left  breast in female, estrogen receptor positive ([name])   02/21/2018 Surgery    LEFT BREAST LUMPECTOMY WITH AXILLARY LYMPH NODE BIOPSY by Dr. Aron  02/21/18   02/21/2018 Pathology Results   Diagnosis 02/21/18 1. Breast, lumpectomy, Left - INVASIVE DUCTAL CARCINOMA, GRADE I, 1.6 CM. - DUCTAL CARCINOMA IN SITU, INTERMEDIATE NUCLEAR GRADE. - ANTERIOR AND MEDIAL RESECTION MARGINS ARE POSITIVE FOR CARCINOMA. - NEGATIVE FOR LYMPHOVASCULAR OR PERINEURAL INVASION. - BACKGROUND BREAST TISSUE WITH FIBROCYSTIC CHANGE, INCLUDING SCLEROSING ADENOSIS. - BIOPSY SITE CHANGES. - SEE ONCOLOGY TABLE. 2. Lymph node, sentinel, biopsy, Left Axillary #1 - METASTATIC BREAST CARCINOMA TO A LYMPH NODE, 1.0 CM IN GREATEST DIMENSION, WITH EXTRANODAL EXTENSION (1/1). 3. Lymph node, sentinel, biopsy, Left Axillary #2 - LYMPH NODE, NEGATIVE FOR CARCINOMA (0/1).    02/21/2018 Miscellaneous   Mammaprint 02/21/18 Low Risk with 10-year risk of recurrnce at 10% -No potential signifcant chemotherapy benefit   03/20/2018 Pathology Results   RE-EXCISION OF BREAST LUMPECTOMY by Dr. Aron  Diagnosis 03/20/18 1. Breast, excision, Left new anterior margin - FIBROCYSTIC CHANGES WITH ADENOSIS AND CALCIFICATIONS. - HEALING BIOPSY SITE. - THERE IS NO EVIDENCE OF MALIGNANCY. 2. Breast, excision, Left new medial margin - FIBROCYSTIC CHANGES WITH ADENOSIS AND CALCIFICATIONS. - HEALING BIOPSY SITE. - THERE IS NO EVIDENCE OF MALIGNANCY. Microscopic Comment 1. -2. The surgical resection margin(s) of the specimen were inked and microscopically evaluated. (JBK:kh 03-22-18)   04/09/2018 Cancer Staging   Staging form: Breast, AJCC 8th Edition - Pathologic: Stage IA (pT1c, pN1, cM0, G1, ER+, PR+, HER2-) - Signed by [name], MD on 04/09/2018   04/22/2018 - 06/03/2018 Radiation Therapy   Radaiton with Dr. Izell 04/22/18-06/03/18   05/2018 -  Anti-estrogen oral therapy   Letrozole  2.5mg  started 05/2018  Survivorship   Per Mayme Silversmith, NP    05/04/2022 Imaging    IMPRESSION: Cervical spondylosis, as described.   Nonspecific straightening of the expected cervical lordosis.   07/24/2022 Imaging    IMPRESSION: 1. Extensive osseous metastatic disease with pathologic fracture at the base of dens and C2 right lateral mass. Extraosseous tumor at C1 and C2 likely impinging on the right C2 and C3 nerve roots. 2. Degenerative cord impingement at C4-5 to C6-7. Biforaminal impingement at C5-6 and C6-7.   08/03/2022 PET scan    IMPRESSION: 1. Large volume osseous metastasis. 2. Low right cervical and probable right axillary nodal metastasis. 3. Subtle heterogeneous activity throughout the liver with suggestion of small liver lesions (likely new compared to chest CT of 07/16/2020). Findings are overall moderately suspicious for hepatic metastasis. Pre and post contrast abdominal MRI (preferred) or CT could confirm. 4. Right-sided pleural thickening and trace pleural fluid. Right base airspace disease is favored to represent chronic atelectasis. 5. Aortic atherosclerosis (ICD10-I70.0) and emphysema (ICD10-J43.9).     08/11/2022 Genetic Testing   Negative genetic testing on the CancerNext-Expanded+RNAinsight panel.  The report date is August 11, 2022.  The CancerNext-Expanded gene panel offered by [facility] and includes sequencing and rearrangement analysis for the following 77 genes: AIP, ALK, APC*, ATM*, AXIN2, BAP1, BARD1, BLM, BMPR1A, BRCA1*, BRCA2*, BRIP1*, CDC73, CDH1*, CDK4, CDKN1B, CDKN2A, CHEK2*, CTNNA1, DICER1, FANCC, FH, FLCN, GALNT12, KIF1B, LZTR1, MAX, MEN1, MET, MLH1*, MSH2*, MSH3, MSH6*, MUTYH*, NBN, NF1*, NF2, NTHL1, PALB2*, PHOX2B, PMS2*, POT1, PRKAR1A, PTCH1, PTEN*, RAD51C*, RAD51D*, RB1, RECQL, RET, SDHA, SDHAF2, SDHB, SDHC, SDHD, SMAD4, SMARCA4, SMARCB1, SMARCE1, STK11, SUFU, TMEM127, TP53*, TSC1, TSC2, VHL and XRCC2 (sequencing and deletion/duplication); EGFR, EGLN1, HOXB13, KIT, MITF, PDGFRA,  POLD1, and POLE (sequencing only); EPCAM and GREM1 (deletion/duplication only). DNA and RNA analyses performed for * genes.    Lobular carcinoma in situ (LCIS) of right breast  01/20/2020 Mammogram   Diagnostic Mammogram 01/20/20 IMPRESSION: 1.  Stable post lumpectomy changes of the left breast.   2. Suspicious microcalcifications over the right upper outer quadrant spanning 3.6 cm.   02/02/2020 Initial Biopsy   Diagnosis 02/02/20 Breast, right, needle core biopsy, upper outer quadrant, x clip - LOBULAR CARCINOMA IN SITU WITH PLEOMORPHIC FEATURES AND CALCIFICATIONS, INVOLVING ADENOSIS. SEE NOTE Diagnosis Note Immunohistochemical stain for E-cadherin is negative in the lesional cells, consistent with a lobular phenotype. Immunostains for p63, SMM 1 and calponin do not show evidence of invasive carcinoma.    02/04/2020 Initial Diagnosis   Lobular carcinoma in situ (LCIS) of right breast   03/18/2020 Surgery   RIGHT BREAST LUMPECTOMY WITH RADIOACTIVE SEED LOCALIZATION by Dr Marla    03/18/2020 Pathology Results   FINAL MICROSCOPIC DIAGNOSIS:   A. BREAST, RIGHT, LUMPECTOMY:  - Pleomorphic lobular carcinoma in situ with calcifications and  underlying complex sclerosing lesion, adenosis and fibroadenomatoid  change.  - Margins of resection are not involved (Closest margins: < 1 mm,  anterior, posterior, inferior and medial).  - Biopsy site.    COMMENT:   P63, Calponin and SMM-1 demonstrate the presence of myoepithelium in the  select focus.    Metastatic breast cancer to bone and brain  05/04/2022 Imaging    IMPRESSION: Cervical spondylosis, as described.   Nonspecific straightening of the expected cervical lordosis.   07/24/2022 Imaging    IMPRESSION: 1. Extensive osseous metastatic disease with pathologic fracture at the base of dens and C2 right lateral mass. Extraosseous tumor at C1 and C2 likely impinging on  the right C2 and C3 nerve roots. 2. Degenerative cord  impingement at C4-5 to C6-7. Biforaminal impingement at C5-6 and C6-7.   08/02/2022 Initial Diagnosis   Cancer, metastatic to bone ([name])   08/03/2022 PET scan    IMPRESSION: 1. Large volume osseous metastasis. 2. Low right cervical and probable right axillary nodal metastasis. 3. Subtle heterogeneous activity throughout the liver with suggestion of small liver lesions (likely new compared to chest CT of 07/16/2020). Findings are overall moderately suspicious for hepatic metastasis. Pre and post contrast abdominal MRI (preferred) or CT could confirm. 4. Right-sided pleural thickening and trace pleural fluid. Right base airspace disease is favored to represent chronic atelectasis. 5. Aortic atherosclerosis (ICD10-I70.0) and emphysema (ICD10-J43.9).     10/26/2022 - 04/12/2023 Chemotherapy   Patient is on Treatment Plan : BREAST Paclitaxel  D1,8,15 + Trastuzumab  D1 + Pertuzumab  D1 q21d x 8 cycles / Trastuzumab  D1 + Pertuzumab  D1 q21d x 4 cycles     05/03/2023 -  Chemotherapy   Patient is on Treatment Plan : BREAST MAINTENANCE Trastuzumab  IV (6) or SQ (600) D1 q21d X 11 Cycles        Discussed the use of AI scribe software for clinical note transcription with the patient, who gave verbal consent to proceed.  History of Present Illness Martha [name] is a 63 year old female with metastatic breast cancer who presents for follow-up.  She has no new symptoms since her last visit and no headaches or issues from previous radiation treatments. Her hair is regrowing slightly. She is on oral chemotherapy and antibody treatment without any issues. Diarrhea is under control. She inquires about reducing her Benadryl  premedication from two to one tablet during treatment, as she finds two excessive, especially since she is driving. She has not experienced any reactions to the treatment that would necessitate Benadryl . No medication refills are needed at this time.     All other systems  were reviewed with the patient and are negative.  MEDICAL HISTORY:  Past Medical History:  Diagnosis Date   Anxiety    Cancer ([name]) 2022   right breast LCIS   Cancer ([name]) 2019   left breast   Depression    Dysrhythmia    SVT, s/p ablation ~ 2012 in at [facility]   Ectopic pregnancy    Family history of breast cancer    Family history of melanoma    History of radiation therapy 04/22/18-06/03/18   Left Breast, left SCV, axilla 50 Gy in 25 fractions, Left breast boost 10 Gy in 5 fractions.    Hypothyroidism    Personal history of radiation therapy    PONV (postoperative nausea and vomiting)    Thyroid  disease     SURGICAL HISTORY: Past Surgical History:  Procedure Laterality Date   APPENDECTOMY     BILATERAL SALPINGECTOMY     BREAST BIOPSY Right 2014   fibroadenoma   BREAST BIOPSY Left 01/16/2018   BREAST BIOPSY Right 02/02/2020   LCIS   BREAST BIOPSY Right 08/04/2020   x2 LCIS   BREAST BIOPSY Right 08/13/2020   x2   BREAST EXCISIONAL BIOPSY Left 1990   benign   BREAST EXCISIONAL BIOPSY Right 02/02/2020   LCIS   BREAST LUMPECTOMY Left 01/22/2018   BREAST LUMPECTOMY WITH AXILLARY LYMPH NODE BIOPSY Left 02/21/2018   Procedure: LEFT BREAST LUMPECTOMY WITH AXILLARY LYMPH NODE BIOPSY;  Surgeon: [name], MD;  Location: [facility] OR;  Service: General;  Laterality: Left;   BREAST LUMPECTOMY  WITH RADIOACTIVE SEED LOCALIZATION Right 03/18/2020   Procedure: RIGHT BREAST LUMPECTOMY WITH RADIOACTIVE SEED LOCALIZATION;  Surgeon: [name], MD;  Location: [facility] OR;  Service: General;  Laterality: Right;   BREAST SURGERY Left 1993   cyst removed   ENDOMETRIAL ABLATION     EYE SURGERY     GLAUCOMA SURGERY Bilateral    IR IMAGING GUIDED PORT INSERTION  10/16/2022   POSTERIOR CERVICAL FUSION/FORAMINOTOMY N/A 08/17/2022   Procedure: Cervical One-Cervical Four POSTERIOR CERVICAL FUSION,  Cervical One LAMINECTOMY REDUCTION OF Cervical Two Fracture, Biopsy of Right Cerical  Two Pars  Lesion;  Surgeon: Debby Dorn MATSU, MD;  Location: [facility] OR;  Service: Neurosurgery;  Laterality: N/A;   POSTERIOR CERVICAL FUSION/FORAMINOTOMY N/A 09/06/2022   Procedure: Posterior Cervical Fusion, Foraminotomy , Cervical Five-Six, Cervical Six-Seven; Extension of  fusion Cervical Four- Thoracic One;  Surgeon: Debby Dorn MATSU, MD;  Location: [facility] OR;  Service: Neurosurgery;  Laterality: N/A;   RADIOACTIVE SEED GUIDED EXCISIONAL BREAST BIOPSY Right 04/28/2021   Procedure: RADIOACTIVE SEED GUIDED EXCISIONAL RIGHT BREAST BIOPSY X2;  Surgeon: [name], MD;  Location: [facility];  Service: General;  Laterality: Right;   RE-EXCISION OF BREAST LUMPECTOMY Left 03/20/2018   Procedure: RE-EXCISION OF BREAST LUMPECTOMY;  Surgeon: [name], MD;  Location: [facility];  Service: General;  Laterality: Left;    I have reviewed the social history and family history with the patient and they are unchanged from previous note.  ALLERGIES:  is allergic to morphine and codeine.  MEDICATIONS:  Current Outpatient Medications  Medication Sig Dispense Refill   acetaminophen  (TYLENOL ) 500 MG tablet Take 2 tablets (1,000 mg total) by mouth every 8 (eight) hours. 30 tablet 0   capecitabine  (XELODA ) 500 MG tablet TAKE 2 TABLETS TWICE DAILY FOR 7 DAYS ON THEN 7 DAYS OFF. TAKE AFTER MEALS 56 tablet 0   citalopram  (CELEXA ) 40 MG tablet Take 1 tablet (40 mg total) by mouth daily. 90 tablet 0   diphenoxylate -atropine  (LOMOTIL ) 2.5-0.025 MG tablet Take 1 tablet by mouth 4 (four) times daily as needed for diarrhea or loose stools. 90 tablet 3   levothyroxine  (SYNTHROID ) 75 MCG tablet Take 1 tablet (75 mcg total) by mouth daily before breakfast. 90 tablet 3   loperamide  (IMODIUM  A-D) 2 MG tablet Take 1 tablet (2 mg total) by mouth 4 (four) times daily as needed for diarrhea or loose stools. 90 tablet 0   Mouthwashes (MOUTH RINSE) LIQD solution 15 mLs by Mouth Rinse route as needed  (for oral care).  0   ondansetron  (ZOFRAN ) 8 MG tablet Take 1 tablet (8 mg total) by mouth every 8 (eight) hours as needed for nausea or vomiting. 20 tablet 2   OVER THE COUNTER MEDICATION Take 1 tablet by mouth daily. Protandim Supplement     pregabalin  (LYRICA ) 25 MG capsule Take 1 capsule (25 mg total) by mouth 3 (three) times daily. 90 capsule 3   tucatinib  (TUKYSA ) 150 MG tablet Take 2 tablets (300 mg total) by mouth 2 (two) times daily. Take as instructed per MD. 120 tablet 2   No current facility-administered medications for this visit.    PHYSICAL EXAMINATION: ECOG PERFORMANCE STATUS: 2 - Symptomatic, <50% confined to bed  Vitals:   03/13/24 1100  BP: 100/62  Pulse: 87  Resp: 17  Temp: 98.3 F (36.8 C)  SpO2: 98%   Wt Readings from Last 3 Encounters:  03/13/24 73 lb (33.1 kg)  02/21/24 71 lb 9.6 oz (32.5  kg)  01/31/24 73 lb 11.2 oz (33.4 kg)     GENERAL:alert, no distress and comfortable, thin  SKIN: skin color, texture, turgor are normal, no rashes or significant lesions EYES: normal, Conjunctiva are pink and non-injected, sclera clear Musculoskeletal:no cyanosis of digits and no clubbing  NEURO: alert & oriented x 3 with fluent speech, no focal motor/sensory deficits  Physical Exam    LABORATORY DATA:  I have reviewed the data as listed    Latest Ref Rng & Units 03/13/2024   10:44 AM 02/21/2024   10:41 AM 01/31/2024    9:54 AM  CBC  WBC 4.0 - 10.5 K/uL 5.7  5.6  5.3   Hemoglobin 12.0 - 15.0 g/dL 84.6  84.1  84.9   Hematocrit 36.0 - 46.0 % 42.8  44.0  43.4   Platelets 150 - 400 K/uL 169  188  158         Latest Ref Rng & Units 03/13/2024   10:44 AM 02/21/2024   10:41 AM 01/31/2024    9:54 AM  CMP  Glucose 70 - 99 mg/dL 80  891  898   BUN 8 - 23 mg/dL 10  9  11    Creatinine 0.44 - 1.00 mg/dL 9.51  9.50  9.43   Sodium 135 - 145 mmol/L 138  136  137   Potassium 3.5 - 5.1 mmol/L 3.9  3.8  4.2   Chloride 98 - 111 mmol/L 102  99  102   CO2 22 - 32 mmol/L 33   32  31   Calcium 8.9 - 10.3 mg/dL 9.3  9.5  9.2   Total Protein 6.5 - 8.1 g/dL 6.5  6.8  7.0   Total Bilirubin 0.0 - 1.2 mg/dL 0.6  0.8  1.0   Alkaline Phos 38 - 126 U/L 78  64  109   AST 15 - 41 U/L 17  13  20    ALT 0 - 44 U/L 15  7  29        RADIOGRAPHIC STUDIES: I have personally reviewed the radiological images as listed and agreed with the findings in the report. No results found.    No orders of the defined types were placed in this encounter.  All questions were answered. The patient knows to call the clinic with any problems, questions or concerns. No barriers to learning was detected. The total time spent in the appointment was 25 minutes, including review of chart and various tests results, discussions about plan of care and coordination of care plan     Onita Mattock, MD 03/13/2024

## 2024-03-13 NOTE — Patient Instructions (Signed)
 [facility] - A DEPT OF [facility]  Discharge Instructions: Thank you for choosing [facility] to provide your oncology and hematology care.   If you have a lab appointment with the Cancer Center, please go directly to the Cancer Center and check in at the registration area.   Wear comfortable clothing and clothing appropriate for easy access to any Portacath or PICC line.   We strive to give you quality time with your provider. You may need to reschedule your appointment if you arrive late (15 or more minutes).  Arriving late affects you and other patients whose appointments are after yours.  Also, if you miss three or more appointments without notifying the office, you may be dismissed from the clinic at the provider's discretion.      For prescription refill requests, have your pharmacy contact our office and allow 72 hours for refills to be completed.    Today you received the following chemotherapy and/or immunotherapy agents: Kanjinti.       To help prevent nausea and vomiting after your treatment, we encourage you to take your nausea medication as directed.  BELOW ARE SYMPTOMS THAT SHOULD BE REPORTED IMMEDIATELY: *FEVER GREATER THAN 100.4 F (38 C) OR HIGHER *CHILLS OR SWEATING *NAUSEA AND VOMITING THAT IS NOT CONTROLLED WITH YOUR NAUSEA MEDICATION *UNUSUAL SHORTNESS OF BREATH *UNUSUAL BRUISING OR BLEEDING *URINARY PROBLEMS (pain or burning when urinating, or frequent urination) *BOWEL PROBLEMS (unusual diarrhea, constipation, pain near the anus) TENDERNESS IN MOUTH AND THROAT WITH OR WITHOUT PRESENCE OF ULCERS (sore throat, sores in mouth, or a toothache) UNUSUAL RASH, SWELLING OR PAIN  UNUSUAL VAGINAL DISCHARGE OR ITCHING   Items with * indicate a potential emergency and should be followed up as soon as possible or go to the Emergency Department if any problems should occur.  Please show the CHEMOTHERAPY ALERT CARD or IMMUNOTHERAPY  ALERT CARD at check-in to the Emergency Department and triage nurse.  Should you have questions after your visit or need to cancel or reschedule your appointment, please contact [facility] - A DEPT OF [facility]  Dept: [phone]  and follow the prompts.  Office hours are 8:00 a.m. to 4:30 p.m. Monday - Friday. Please note that voicemails left after 4:00 p.m. may not be returned until the following business day.  We are closed weekends and major holidays. You have access to a nurse at all times for urgent questions. Please call the main number to the clinic Dept: [phone] and follow the prompts.   For any non-urgent questions, you may also contact your provider using MyChart. We now offer e-Visits for anyone 70 and older to request care online for non-urgent symptoms. For details visit [email or web address].   Also download the MyChart app! Go to the app store, search "MyChart", open the app, select [facility], and log in with your MyChart username and password.  Zoledronic Acid Injection (Cancer) What is this medication? ZOLEDRONIC ACID (ZOE le dron ik AS id) treats high calcium levels in the blood caused by cancer. It may also be used with chemotherapy to treat weakened bones caused by cancer. It works by slowing down the release of calcium from bones. This lowers calcium levels in your blood. It also makes your bones stronger and less likely to break (fracture). It belongs to a group of medications called bisphosphonates. This medicine may be used for other purposes; ask your health care  provider or pharmacist if you have questions. COMMON BRAND NAME(S): Zometa, Zometa Powder What should I tell my care team before I take this medication? They need to know if you have any of these conditions: Dehydration Dental disease Kidney disease Liver disease Low levels of calcium in the blood Lung or breathing disease, such as asthma Receiving steroids, such as  dexamethasone or prednisone An unusual or allergic reaction to zoledronic acid, other medications, foods, dyes, or preservatives Pregnant or trying to get pregnant Breast-feeding How should I use this medication? This medication is injected into a vein. It is given by your care team in a hospital or clinic setting. Talk to your care team about the use of this medication in children. Special care may be needed. Overdosage: If you think you have taken too much of this medicine contact a poison control center or emergency room at once. NOTE: This medicine is only for you. Do not share this medicine with others. What if I miss a dose? Keep appointments for follow-up doses. It is important not to miss your dose. Call your care team if you are unable to keep an appointment. What may interact with this medication? Certain antibiotics given by injection Diuretics, such as bumetanide, furosemide NSAIDs, medications for pain and inflammation, such as ibuprofen or naproxen Teriparatide Thalidomide This list may not describe all possible interactions. Give your health care provider a list of all the medicines, herbs, non-prescription drugs, or dietary supplements you use. Also tell them if you smoke, drink alcohol, or use illegal drugs. Some items may interact with your medicine. What should I watch for while using this medication? Visit your care team for regular checks on your progress. It may be some time before you see the benefit from this medication. Some people who take this medication have severe bone, joint, or muscle pain. This medication may also increase your risk for jaw problems or a broken thigh bone. Tell your care team right away if you have severe pain in your jaw, bones, joints, or muscles. Tell you care team if you have any pain that does not go away or that gets worse. Tell your dentist and dental surgeon that you are taking this medication. You should not have major dental surgery  while on this medication. See your dentist to have a dental exam and fix any dental problems before starting this medication. Take good care of your teeth while on this medication. Make sure you see your dentist for regular follow-up appointments. You should make sure you get enough calcium and vitamin D while you are taking this medication. Discuss the foods you eat and the vitamins you take with your care team. Check with your care team if you have severe diarrhea, nausea, and vomiting, or if you sweat a lot. The loss of too much body fluid may make it dangerous for you to take this medication. You may need bloodwork while taking this medication. Talk to your care team if you wish to become pregnant or think you might be pregnant. This medication can cause serious birth defects. What side effects may I notice from receiving this medication? Side effects that you should report to your care team as soon as possible: Allergic reactions--skin rash, itching, hives, swelling of the face, lips, tongue, or throat Kidney injury--decrease in the amount of urine, swelling of the ankles, hands, or feet Low calcium level--muscle pain or cramps, confusion, tingling, or numbness in the hands or feet Osteonecrosis of the jaw--pain, swelling, or  redness in the mouth, numbness of the jaw, poor healing after dental work, unusual discharge from the mouth, visible bones in the mouth Severe bone, joint, or muscle pain Side effects that usually do not require medical attention (report to your care team if they continue or are bothersome): Constipation Fatigue Fever Loss of appetite Nausea Stomach pain This list may not describe all possible side effects. Call your doctor for medical advice about side effects. You may report side effects to FDA at 1-800-FDA-1088. Where should I keep my medication? This medication is given in a hospital or clinic. It will not be stored at home. NOTE: This sheet is a summary. It may  not cover all possible information. If you have questions about this medicine, talk to your doctor, pharmacist, or health care provider.  2024 [employer]/Gold Standard (2021-10-07 00:00:00)

## 2024-03-14 ENCOUNTER — Telehealth: Payer: Self-pay | Admitting: Hematology

## 2024-03-14 NOTE — Telephone Encounter (Signed)
 Called patient to schedule upcoming appointments per  WQ. Patient is aware of the upcoming appointments.

## 2024-03-24 ENCOUNTER — Ambulatory Visit (HOSPITAL_COMMUNITY): Admission: RE | Admit: 2024-03-24 | Source: Ambulatory Visit

## 2024-03-24 ENCOUNTER — Other Ambulatory Visit: Payer: Self-pay | Admitting: Hematology

## 2024-03-24 DIAGNOSIS — C7951 Secondary malignant neoplasm of bone: Secondary | ICD-10-CM

## 2024-03-28 ENCOUNTER — Other Ambulatory Visit: Payer: Self-pay

## 2024-04-02 NOTE — Assessment & Plan Note (Addendum)
-  Diagnosed in 06/2022, ER-/PR-/HER2+ --PET scan from 08/03/2022 showed diffuse bone mets  -she underwent cervical laminectomy and fusion by Dr. Debby on 08/17/2022, biopsy confirmed metastatic breast cancer, ER/PR negative, HER2 positive. -due to cervical cord compression with myelopathy, she underwent a second cervical spine surgery on September 06, 2022, and completed inpatient rehabitation. -She has completed brain, cervical and lumbar spine radiation in Feb 2024 -She was recently hospitalized for altered mental status and confusion, probably secondary to medication -She started first line systemic chemotherapy with weekly Taxol  and trastuzumab /Perjeta  on 10/25/22. She has tolerated well so far and her pain has improved overall -PET scan from Jan 01, 2023 showed complete metabolic response, no hypermetabolic uptake in diffuse bone lesions, no other new lesions.  She is clinically also doing much better.  Given her excellent response to chemo, we will change her Taxol  to 2 weeks on and 1 week off -She Unfortunately developed more more brain metastasis on recent brain MRI. -I personally reviewed her restaging PET scan from April 13, 2023, which showed stable disease, no new lesions. -Due to her new brain metastasis, I changed her therapy to Tucatinib , capecitabine  and trastuzumab , for better brain penetration. She started in late August 2024, tolerating well so far.  Her case was reviewed in CNS tumor board, Dr. Izell plan to hold on whole brain radiation since she is starting new Her2 antibody and monitor her brain mets closely.  -restaging MRI brian 08/30/2023 showed partial response, PET in 06/2023 showed no hypermetabolic lesions  -brain MRI 12/27/2023 showed progressive brain mets, she started whole brain RT on 5/19 and completed on 02/01/2024  -she is also on Zometa  every 3 months -continue Tucatinib , capecitabine  and trastuzumab  -PET 02/19/2024 showed no hypermetabolic disease.

## 2024-04-03 ENCOUNTER — Encounter: Payer: Self-pay | Admitting: Nurse Practitioner

## 2024-04-03 ENCOUNTER — Inpatient Hospital Stay: Admitting: Nurse Practitioner

## 2024-04-03 ENCOUNTER — Inpatient Hospital Stay (HOSPITAL_BASED_OUTPATIENT_CLINIC_OR_DEPARTMENT_OTHER): Admitting: Hematology

## 2024-04-03 ENCOUNTER — Inpatient Hospital Stay: Attending: Genetic Counselor

## 2024-04-03 ENCOUNTER — Inpatient Hospital Stay

## 2024-04-03 VITALS — BP 113/79 | HR 78 | Temp 97.6°F | Resp 17 | Ht 62.0 in | Wt 72.1 lb

## 2024-04-03 DIAGNOSIS — C7951 Secondary malignant neoplasm of bone: Secondary | ICD-10-CM | POA: Diagnosis not present

## 2024-04-03 DIAGNOSIS — C50312 Malignant neoplasm of lower-inner quadrant of left female breast: Secondary | ICD-10-CM

## 2024-04-03 DIAGNOSIS — R530 Neoplastic (malignant) related fatigue: Secondary | ICD-10-CM

## 2024-04-03 DIAGNOSIS — Z5112 Encounter for antineoplastic immunotherapy: Secondary | ICD-10-CM | POA: Insufficient documentation

## 2024-04-03 DIAGNOSIS — C7931 Secondary malignant neoplasm of brain: Secondary | ICD-10-CM | POA: Diagnosis not present

## 2024-04-03 DIAGNOSIS — Z515 Encounter for palliative care: Secondary | ICD-10-CM

## 2024-04-03 DIAGNOSIS — F17210 Nicotine dependence, cigarettes, uncomplicated: Secondary | ICD-10-CM | POA: Insufficient documentation

## 2024-04-03 DIAGNOSIS — M792 Neuralgia and neuritis, unspecified: Secondary | ICD-10-CM

## 2024-04-03 DIAGNOSIS — Z79899 Other long term (current) drug therapy: Secondary | ICD-10-CM | POA: Diagnosis not present

## 2024-04-03 DIAGNOSIS — Z170 Estrogen receptor positive status [ER+]: Secondary | ICD-10-CM

## 2024-04-03 DIAGNOSIS — Z95828 Presence of other vascular implants and grafts: Secondary | ICD-10-CM

## 2024-04-03 LAB — CBC WITH DIFFERENTIAL (CANCER CENTER ONLY)
Abs Immature Granulocytes: 0.03 K/uL (ref 0.00–0.07)
Basophils Absolute: 0 K/uL (ref 0.0–0.1)
Basophils Relative: 1 %
Eosinophils Absolute: 0 K/uL (ref 0.0–0.5)
Eosinophils Relative: 1 %
HCT: 42.5 % (ref 36.0–46.0)
Hemoglobin: 15.1 g/dL — ABNORMAL HIGH (ref 12.0–15.0)
Immature Granulocytes: 1 %
Lymphocytes Relative: 15 %
Lymphs Abs: 0.6 K/uL — ABNORMAL LOW (ref 0.7–4.0)
MCH: 34.8 pg — ABNORMAL HIGH (ref 26.0–34.0)
MCHC: 35.5 g/dL (ref 30.0–36.0)
MCV: 97.9 fL (ref 80.0–100.0)
Monocytes Absolute: 0.5 K/uL (ref 0.1–1.0)
Monocytes Relative: 12 %
Neutro Abs: 3 K/uL (ref 1.7–7.7)
Neutrophils Relative %: 70 %
Platelet Count: 157 K/uL (ref 150–400)
RBC: 4.34 MIL/uL (ref 3.87–5.11)
RDW: 15.7 % — ABNORMAL HIGH (ref 11.5–15.5)
WBC Count: 4.1 K/uL (ref 4.0–10.5)
nRBC: 0 % (ref 0.0–0.2)

## 2024-04-03 LAB — CMP (CANCER CENTER ONLY)
ALT: 10 U/L (ref 0–44)
AST: 15 U/L (ref 15–41)
Albumin: 4.6 g/dL (ref 3.5–5.0)
Alkaline Phosphatase: 67 U/L (ref 38–126)
Anion gap: 4 — ABNORMAL LOW (ref 5–15)
BUN: 12 mg/dL (ref 8–23)
CO2: 32 mmol/L (ref 22–32)
Calcium: 9.3 mg/dL (ref 8.9–10.3)
Chloride: 98 mmol/L (ref 98–111)
Creatinine: 0.57 mg/dL (ref 0.44–1.00)
GFR, Estimated: >60 mL/min (ref >60)
Glucose, Bld: 93 mg/dL (ref 70–99)
Potassium: 4.2 mmol/L (ref 3.5–5.1)
Sodium: 134 mmol/L — ABNORMAL LOW (ref 135–145)
Total Bilirubin: 0.7 mg/dL (ref 0.0–1.2)
Total Protein: 6.7 g/dL (ref 6.5–8.1)

## 2024-04-03 MED ORDER — TRASTUZUMAB-ANNS CHEMO 150 MG IV SOLR
6.0000 mg/kg | Freq: Once | INTRAVENOUS | Status: AC
  Administered 2024-04-03: 210 mg via INTRAVENOUS
  Filled 2024-04-03: qty 10

## 2024-04-03 MED ORDER — TUCATINIB 150 MG PO TABS: 300.0000 mg | ORAL_TABLET | Freq: Two times a day (BID) | ORAL | 2 refills | Status: DC

## 2024-04-03 MED ORDER — CAPECITABINE 500 MG PO TABS: ORAL_TABLET | ORAL | 2 refills | Status: DC

## 2024-04-03 MED ORDER — ACETAMINOPHEN 325 MG PO TABS
650.0000 mg | ORAL_TABLET | Freq: Once | ORAL | Status: AC
  Administered 2024-04-03: 650 mg via ORAL
  Filled 2024-04-03: qty 2

## 2024-04-03 MED ORDER — SODIUM CHLORIDE 0.9 % IV SOLN
Freq: Once | INTRAVENOUS | Status: AC
Start: 2024-04-03 — End: 2024-04-03

## 2024-04-03 MED ORDER — SODIUM CHLORIDE 0.9% FLUSH
10.0000 mL | Freq: Once | INTRAVENOUS | Status: AC
  Administered 2024-04-03: 10 mL

## 2024-04-03 NOTE — Progress Notes (Signed)
 [name]   [phone]) [phone] [phone]   Clinic Follow up Note   Patient Care Team: [name], MD as PCP - General (Family Medicine) [name], MD as Consulting Physician (General Surgery) [name], MD as Consulting Physician (Hematology) [name], MD as Attending Physician (Radiation Oncology) [name], [name], NP as Nurse Practitioner (Nurse Practitioner)  Date of Service:  04/03/2024  CHIEF COMPLAINT: f/u of metastatic breast cancer  CURRENT THERAPY:  Xeloda , Tucatinib , and trastuzumab  every 3 weeks  Oncology History   Metastatic breast cancer to bone and brain -Diagnosed in 06/2022, ER-/PR-/HER2+ --PET scan from 08/03/2022 showed diffuse bone mets  -she underwent cervical laminectomy and fusion by Dr. Debby on 08/17/2022, biopsy confirmed metastatic breast cancer, ER/PR negative, HER2 positive. -due to cervical cord compression with myelopathy, she underwent a second cervical spine surgery on September 06, 2022, and completed inpatient rehabitation. -She has completed brain, cervical and lumbar spine radiation in Feb 2024 -She was recently hospitalized for altered mental status and confusion, probably secondary to medication -She started first line systemic chemotherapy with weekly Taxol  and trastuzumab /Perjeta  on 10/25/22. She has tolerated well so far and her pain has improved overall -PET scan from Jan 01, 2023 showed complete metabolic response, no hypermetabolic uptake in diffuse bone lesions, no other new lesions.  She is clinically also doing much better.  Given her excellent response to chemo, we will change her Taxol  to 2 weeks on and 1 week off -She Unfortunately developed more more brain metastasis on recent brain MRI. -I personally reviewed her restaging PET scan from April 13, 2023, which showed stable disease, no new lesions. -Due to her new brain metastasis, I changed her therapy to Tucatinib , capecitabine  and trastuzumab ,  for better brain penetration. She started in late August 2024, tolerating well so far.  Her case was reviewed in CNS tumor board, Dr. Izell plan to hold on whole brain radiation since she is starting new Her2 antibody and monitor her brain mets closely.  -restaging MRI brian 08/30/2023 showed partial response, PET in 06/2023 showed no hypermetabolic lesions  -brain MRI 12/27/2023 showed progressive brain mets, she started whole brain RT on 5/19 and completed on 02/01/2024  -she is also on Zometa  every 3 months -continue Tucatinib , capecitabine  and trastuzumab  -PET 02/19/2024 showed no hypermetabolic disease.    Assessment & Plan Breast cancer on ongoing chemotherapy Breast cancer is being managed with ongoing chemotherapy, including capecitabine  and tucatinib , . She reports no significant side effects such as diarrhea or nausea. Blood counts are normal, and the last scan in June was good. - Continue capecitabine  and tucatinib  as prescribed, refilled today  - Schedule next scan at the end of the year - Ensure echocardiogram is completed on August 29 - Cancel clinic appointment on August 28 and schedule with Powell on September 18  Moderate protein and calorie malnutrition She reports difficulty gaining weight despite eating well. She is currently consuming one bottle of Ensure Boost daily, which provides 200 calories. The goal is to increase caloric intake to at least 1500 calories from food and supplements, ideally reaching 2000 calories to promote weight gain. - Increase Ensure Boost intake to two or three bottles daily to increase caloric intake  Alopecia secondary to radiation therapy Alopecia persists following radiation therapy. She reports no hair regrowth yet, which is a known side effect of radiation.  Plan - She is tolerating treatment well, will continue capecitabine  and tucatinib  at the same dose - Labs reviewed,  will proceed with trastuzumab  today and continue every 3 weeks -  Follow-up in 6 weeks - Repeated echo scheduled for the end of this month   SUMMARY OF ONCOLOGIC HISTORY: Oncology History Overview Note  Cancer Staging Malignant neoplasm of lower-inner quadrant of left breast in female, estrogen receptor positive ([name]) Staging form: Breast, AJCC 8th Edition - Clinical stage from 01/16/2018: Stage IA (cT1c, cN0, cM0, G2, ER+, PR+, HER2-) - Signed by [name], MD on 01/23/2018 - Pathologic: Stage IA (pT1c, pN1, cM0, G1, ER+, PR+, HER2-) - Signed by [name], MD on 04/09/2018     Malignant neoplasm of lower-inner quadrant of left breast in female, estrogen receptor positive ([name])  01/15/2018 Mammogram   Diagnositc Mammogram 01/15/18  IMPRESSION: 1. Suspicious 1.2 x 1.4 x 1.3 cm mixed echogenicity mass left breast 7 o'clock position retroareolar location at the site of palpable concern.. 2. Indeterminate Within the left breast 7:30 o'clock retroareolar location, adjacent to the palpable mass, is a 0.5 x 0.4 x 0.5 cm oval circumscribed hypoechoic mass. 3. Indeterminate calcifications within the lateral left breast. Location of these calcifications is not definitely confirmed on the true lateral view.    01/16/2018 Initial Biopsy   Diagnosis 01/16/18 1. Breast, left, needle core biopsy, 7:30 o'clock (ribbon clip) - FIBROCYSTIC CHANGES WITH SCLEROSING ADENOSIS AND CALCIFICATIONS. - FIBROADENOMATOID CHANGE. - NO MALIGNANCY IDENTIFIED. 2. Breast, left, needle core biopsy, 7 o'clock position (coil clip) - INVASIVE MAMMARY CARCINOMA, MSBR GRADE I/II. - SEE MICROSCOPIC DESCRIPTION Microscopic Comment  ADDENDUM: Immunohistochemistry for E-Cadherin is strongly positive in the tumor consistent with ductal carcinoma. (JDP:ah 01/17/18)   01/16/2018 Receptors her2   Estrogen Receptor: 100%, POSITIVE, STRONG STAINING INTENSITY Progesterone Receptor: 50%, POSITIVE, STRONG STAINING INTENSITY Proliferation Marker Ki67: 20% HER2 Negative   01/16/2018 Cancer  Staging   Staging form: Breast, AJCC 8th Edition - Clinical stage from 01/16/2018: Stage IA (cT1c, cN0, cM0, G2, ER+, PR+, HER2-) - Signed by [name], MD on 01/23/2018   01/22/2018 Initial Diagnosis   Malignant neoplasm of lower-inner quadrant of left breast in female, estrogen receptor positive ([name])   02/21/2018 Surgery    LEFT BREAST LUMPECTOMY WITH AXILLARY LYMPH NODE BIOPSY by Dr. Aron  02/21/18   02/21/2018 Pathology Results   Diagnosis 02/21/18 1. Breast, lumpectomy, Left - INVASIVE DUCTAL CARCINOMA, GRADE I, 1.6 CM. - DUCTAL CARCINOMA IN SITU, INTERMEDIATE NUCLEAR GRADE. - ANTERIOR AND MEDIAL RESECTION MARGINS ARE POSITIVE FOR CARCINOMA. - NEGATIVE FOR LYMPHOVASCULAR OR PERINEURAL INVASION. - BACKGROUND BREAST TISSUE WITH FIBROCYSTIC CHANGE, INCLUDING SCLEROSING ADENOSIS. - BIOPSY SITE CHANGES. - SEE ONCOLOGY TABLE. 2. Lymph node, sentinel, biopsy, Left Axillary #1 - METASTATIC BREAST CARCINOMA TO A LYMPH NODE, 1.0 CM IN GREATEST DIMENSION, WITH EXTRANODAL EXTENSION (1/1). 3. Lymph node, sentinel, biopsy, Left Axillary #2 - LYMPH NODE, NEGATIVE FOR CARCINOMA (0/1).    02/21/2018 Miscellaneous   Mammaprint 02/21/18 Low Risk with 10-year risk of recurrnce at 10% -No potential signifcant chemotherapy benefit   03/20/2018 Pathology Results   RE-EXCISION OF BREAST LUMPECTOMY by Dr. Aron  Diagnosis 03/20/18 1. Breast, excision, Left new anterior margin - FIBROCYSTIC CHANGES WITH ADENOSIS AND CALCIFICATIONS. - HEALING BIOPSY SITE. - THERE IS NO EVIDENCE OF MALIGNANCY. 2. Breast, excision, Left new medial margin - FIBROCYSTIC CHANGES WITH ADENOSIS AND CALCIFICATIONS. - HEALING BIOPSY SITE. - THERE IS NO EVIDENCE OF MALIGNANCY. Microscopic Comment 1. -2. The surgical resection margin(s) of the specimen were inked and microscopically evaluated. (JBK:kh 03-22-18)   04/09/2018 Cancer Staging   Staging  form: Breast, AJCC 8th Edition - Pathologic: Stage IA (pT1c, pN1, cM0, G1,  ER+, PR+, HER2-) - Signed by [name], MD on 04/09/2018   04/22/2018 - 06/03/2018 Radiation Therapy   Radaiton with Dr. Izell 04/22/18-06/03/18   05/2018 -  Anti-estrogen oral therapy   Letrozole  2.5mg  started 05/2018    Survivorship   Per Mayme Silversmith, NP    05/04/2022 Imaging    IMPRESSION: Cervical spondylosis, as described.   Nonspecific straightening of the expected cervical lordosis.   07/24/2022 Imaging    IMPRESSION: 1. Extensive osseous metastatic disease with pathologic fracture at the base of dens and C2 right lateral mass. Extraosseous tumor at C1 and C2 likely impinging on the right C2 and C3 nerve roots. 2. Degenerative cord impingement at C4-5 to C6-7. Biforaminal impingement at C5-6 and C6-7.   08/03/2022 PET scan    IMPRESSION: 1. Large volume osseous metastasis. 2. Low right cervical and probable right axillary nodal metastasis. 3. Subtle heterogeneous activity throughout the liver with suggestion of small liver lesions (likely new compared to chest CT of 07/16/2020). Findings are overall moderately suspicious for hepatic metastasis. Pre and post contrast abdominal MRI (preferred) or CT could confirm. 4. Right-sided pleural thickening and trace pleural fluid. Right base airspace disease is favored to represent chronic atelectasis. 5. Aortic atherosclerosis (ICD10-I70.0) and emphysema (ICD10-J43.9).     08/11/2022 Genetic Testing   Negative genetic testing on the CancerNext-Expanded+RNAinsight panel.  The report date is August 11, 2022.  The CancerNext-Expanded gene panel offered by [facility] and includes sequencing and rearrangement analysis for the following 77 genes: AIP, ALK, APC*, ATM*, AXIN2, BAP1, BARD1, BLM, BMPR1A, BRCA1*, BRCA2*, BRIP1*, CDC73, CDH1*, CDK4, CDKN1B, CDKN2A, CHEK2*, CTNNA1, DICER1, FANCC, FH, FLCN, GALNT12, KIF1B, LZTR1, MAX, MEN1, MET, MLH1*, MSH2*, MSH3, MSH6*, MUTYH*, NBN, NF1*, NF2, NTHL1, PALB2*, PHOX2B, PMS2*, POT1,  PRKAR1A, PTCH1, PTEN*, RAD51C*, RAD51D*, RB1, RECQL, RET, SDHA, SDHAF2, SDHB, SDHC, SDHD, SMAD4, SMARCA4, SMARCB1, SMARCE1, STK11, SUFU, TMEM127, TP53*, TSC1, TSC2, VHL and XRCC2 (sequencing and deletion/duplication); EGFR, EGLN1, HOXB13, KIT, MITF, PDGFRA, POLD1, and POLE (sequencing only); EPCAM and GREM1 (deletion/duplication only). DNA and RNA analyses performed for * genes.    Lobular carcinoma in situ (LCIS) of right breast  01/20/2020 Mammogram   Diagnostic Mammogram 01/20/20 IMPRESSION: 1.  Stable post lumpectomy changes of the left breast.   2. Suspicious microcalcifications over the right upper outer quadrant spanning 3.6 cm.   02/02/2020 Initial Biopsy   Diagnosis 02/02/20 Breast, right, needle core biopsy, upper outer quadrant, x clip - LOBULAR CARCINOMA IN SITU WITH PLEOMORPHIC FEATURES AND CALCIFICATIONS, INVOLVING ADENOSIS. SEE NOTE Diagnosis Note Immunohistochemical stain for E-cadherin is negative in the lesional cells, consistent with a lobular phenotype. Immunostains for p63, SMM 1 and calponin do not show evidence of invasive carcinoma.    02/04/2020 Initial Diagnosis   Lobular carcinoma in situ (LCIS) of right breast   03/18/2020 Surgery   RIGHT BREAST LUMPECTOMY WITH RADIOACTIVE SEED LOCALIZATION by Dr Marla    03/18/2020 Pathology Results   FINAL MICROSCOPIC DIAGNOSIS:   A. BREAST, RIGHT, LUMPECTOMY:  - Pleomorphic lobular carcinoma in situ with calcifications and  underlying complex sclerosing lesion, adenosis and fibroadenomatoid  change.  - Margins of resection are not involved (Closest margins: < 1 mm,  anterior, posterior, inferior and medial).  - Biopsy site.    COMMENT:   P63, Calponin and SMM-1 demonstrate the presence of myoepithelium in the  select focus.    Metastatic breast cancer to bone and brain  05/04/2022 Imaging    IMPRESSION: Cervical spondylosis, as described.   Nonspecific straightening of the expected cervical lordosis.   07/24/2022  Imaging    IMPRESSION: 1. Extensive osseous metastatic disease with pathologic fracture at the base of dens and C2 right lateral mass. Extraosseous tumor at C1 and C2 likely impinging on the right C2 and C3 nerve roots. 2. Degenerative cord impingement at C4-5 to C6-7. Biforaminal impingement at C5-6 and C6-7.   08/02/2022 Initial Diagnosis   Cancer, metastatic to bone ([name])   08/03/2022 PET scan    IMPRESSION: 1. Large volume osseous metastasis. 2. Low right cervical and probable right axillary nodal metastasis. 3. Subtle heterogeneous activity throughout the liver with suggestion of small liver lesions (likely new compared to chest CT of 07/16/2020). Findings are overall moderately suspicious for hepatic metastasis. Pre and post contrast abdominal MRI (preferred) or CT could confirm. 4. Right-sided pleural thickening and trace pleural fluid. Right base airspace disease is favored to represent chronic atelectasis. 5. Aortic atherosclerosis (ICD10-I70.0) and emphysema (ICD10-J43.9).     10/26/2022 - 04/12/2023 Chemotherapy   Patient is on Treatment Plan : BREAST Paclitaxel  D1,8,15 + Trastuzumab  D1 + Pertuzumab  D1 q21d x 8 cycles / Trastuzumab  D1 + Pertuzumab  D1 q21d x 4 cycles     05/03/2023 -  Chemotherapy   Patient is on Treatment Plan : BREAST MAINTENANCE Trastuzumab  IV (6) or SQ (600) D1 q21d X 11 Cycles        Discussed the use of AI scribe software for clinical note transcription with the patient, who gave verbal consent to proceed.  History of Present Illness Martha [name] is a 63 year old female with breast cancer who presents for follow-up of her treatment.  She is unable to gain weight despite adequate nutrition, with a current weight of 72 pounds. She consumes one bottle of Ensure Boost daily and is considering increasing to two or three bottles to aid weight gain.  No residual problems from radiation therapy, but her hair has not regrown since  treatment.  She is on oral chemotherapy with capecitabine  and tucatinib , taking capecitabine  two tablets twice a day for one week on and one week off. She experiences no significant side effects such as diarrhea or nausea.  No shortness of breath.     All other systems were reviewed with the patient and are negative.  MEDICAL HISTORY:  Past Medical History:  Diagnosis Date   Anxiety    Cancer ([name]) 2022   right breast LCIS   Cancer ([name]) 2019   left breast   Depression    Dysrhythmia    SVT, s/p ablation ~ 2012 in at [facility]   Ectopic pregnancy    Family history of breast cancer    Family history of melanoma    History of radiation therapy 04/22/18-06/03/18   Left Breast, left SCV, axilla 50 Gy in 25 fractions, Left breast boost 10 Gy in 5 fractions.    Hypothyroidism    Personal history of radiation therapy    PONV (postoperative nausea and vomiting)    Thyroid  disease     SURGICAL HISTORY: Past Surgical History:  Procedure Laterality Date   APPENDECTOMY     BILATERAL SALPINGECTOMY     BREAST BIOPSY Right 2014   fibroadenoma   BREAST BIOPSY Left 01/16/2018   BREAST BIOPSY Right 02/02/2020   LCIS   BREAST BIOPSY Right 08/04/2020   x2 LCIS   BREAST BIOPSY Right 08/13/2020   x2  BREAST EXCISIONAL BIOPSY Left 1990   benign   BREAST EXCISIONAL BIOPSY Right 02/02/2020   LCIS   BREAST LUMPECTOMY Left 01/22/2018   BREAST LUMPECTOMY WITH AXILLARY LYMPH NODE BIOPSY Left 02/21/2018   Procedure: LEFT BREAST LUMPECTOMY WITH AXILLARY LYMPH NODE BIOPSY;  Surgeon: [name], MD;  Location: [facility] OR;  Service: General;  Laterality: Left;   BREAST LUMPECTOMY WITH RADIOACTIVE SEED LOCALIZATION Right 03/18/2020   Procedure: RIGHT BREAST LUMPECTOMY WITH RADIOACTIVE SEED LOCALIZATION;  Surgeon: [name], MD;  Location: [facility] OR;  Service: General;  Laterality: Right;   BREAST SURGERY Left 1993   cyst removed   ENDOMETRIAL ABLATION     EYE SURGERY     GLAUCOMA  SURGERY Bilateral    IR IMAGING GUIDED PORT INSERTION  10/16/2022   POSTERIOR CERVICAL FUSION/FORAMINOTOMY N/A 08/17/2022   Procedure: Cervical One-Cervical Four POSTERIOR CERVICAL FUSION,  Cervical One LAMINECTOMY REDUCTION OF Cervical Two Fracture, Biopsy of Right Cerical Two Pars  Lesion;  Surgeon: Debby Dorn MATSU, MD;  Location: [facility] OR;  Service: Neurosurgery;  Laterality: N/A;   POSTERIOR CERVICAL FUSION/FORAMINOTOMY N/A 09/06/2022   Procedure: Posterior Cervical Fusion, Foraminotomy , Cervical Five-Six, Cervical Six-Seven; Extension of  fusion Cervical Four- Thoracic One;  Surgeon: Debby Dorn MATSU, MD;  Location: [facility] OR;  Service: Neurosurgery;  Laterality: N/A;   RADIOACTIVE SEED GUIDED EXCISIONAL BREAST BIOPSY Right 04/28/2021   Procedure: RADIOACTIVE SEED GUIDED EXCISIONAL RIGHT BREAST BIOPSY X2;  Surgeon: [name], MD;  Location: [facility];  Service: General;  Laterality: Right;   RE-EXCISION OF BREAST LUMPECTOMY Left 03/20/2018   Procedure: RE-EXCISION OF BREAST LUMPECTOMY;  Surgeon: [name], MD;  Location: [facility];  Service: General;  Laterality: Left;    I have reviewed the social history and family history with the patient and they are unchanged from previous note.  ALLERGIES:  is allergic to morphine and codeine.  MEDICATIONS:  Current Outpatient Medications  Medication Sig Dispense Refill   acetaminophen  (TYLENOL ) 500 MG tablet Take 2 tablets (1,000 mg total) by mouth every 8 (eight) hours. 30 tablet 0   capecitabine  (XELODA ) 500 MG tablet TAKE 2 TABLETS TWICE DAILY FOR 7 DAYS ON, THEN 7 DAYS OFF (TAKE AFTER MEALS) 56 tablet 2   citalopram  (CELEXA ) 40 MG tablet Take 1 tablet (40 mg total) by mouth daily. 90 tablet 0   diphenoxylate -atropine  (LOMOTIL ) 2.5-0.025 MG tablet Take 1 tablet by mouth 4 (four) times daily as needed for diarrhea or loose stools. 90 tablet 3   levothyroxine  (SYNTHROID ) 75 MCG tablet Take 1 tablet (75 mcg  total) by mouth daily before breakfast. 90 tablet 3   loperamide  (IMODIUM  A-D) 2 MG tablet Take 1 tablet (2 mg total) by mouth 4 (four) times daily as needed for diarrhea or loose stools. 90 tablet 0   Mouthwashes (MOUTH RINSE) LIQD solution 15 mLs by Mouth Rinse route as needed (for oral care).  0   ondansetron  (ZOFRAN ) 8 MG tablet Take 1 tablet (8 mg total) by mouth every 8 (eight) hours as needed for nausea or vomiting. 20 tablet 2   OVER THE COUNTER MEDICATION Take 1 tablet by mouth daily. Protandim Supplement     pregabalin  (LYRICA ) 25 MG capsule Take 1 capsule (25 mg total) by mouth 3 (three) times daily. 90 capsule 3   tucatinib  (TUKYSA ) 150 MG tablet Take 2 tablets (300 mg total) by mouth 2 (two) times daily. Take as instructed per MD. 120 tablet 2   No current  facility-administered medications for this visit.   Facility-Administered Medications Ordered in Other Visits  Medication Dose Route Frequency Provider Last Rate Last Admin   trastuzumab -anns (KANJINTI ) 210 mg in sodium chloride  0.9 % 250 mL chemo infusion  6 mg/kg (Treatment Plan Recorded) Intravenous Once [name], MD 520 mL/hr at 04/03/24 1254 210 mg at 04/03/24 1254    PHYSICAL EXAMINATION: ECOG PERFORMANCE STATUS: 2 - Symptomatic, <50% confined to bed  Vitals:   04/03/24 1136  BP: 113/79  Pulse: 78  Resp: 17  Temp: 97.6 F (36.4 C)  SpO2: 95%   Wt Readings from Last 3 Encounters:  04/03/24 72 lb 1.6 oz (32.7 kg)  03/13/24 73 lb (33.1 kg)  02/21/24 71 lb 9.6 oz (32.5 kg)     GENERAL:alert, no distress and comfortable SKIN: skin color, texture, turgor are normal, no rashes or significant lesions EYES: normal, Conjunctiva are pink and non-injected, sclera clear NECK: supple, thyroid  normal size, non-tender, without nodularity LYMPH:  no palpable lymphadenopathy in the cervical, axillary  LUNGS: clear to auscultation and percussion with normal breathing effort HEART: regular rate & rhythm and no murmurs and no  lower extremity edema ABDOMEN:abdomen soft, non-tender and normal bowel sounds Musculoskeletal:no cyanosis of digits and no clubbing  NEURO: alert & oriented x 3 with fluent speech, no focal motor/sensory deficits  Physical Exam MEASUREMENTS: Weight- 72.  LABORATORY DATA:  I have reviewed the data as listed    Latest Ref Rng & Units 04/03/2024   11:13 AM 03/13/2024   10:44 AM 02/21/2024   10:41 AM  CBC  WBC 4.0 - 10.5 K/uL 4.1  5.7  5.6   Hemoglobin 12.0 - 15.0 g/dL 84.8  84.6  84.1   Hematocrit 36.0 - 46.0 % 42.5  42.8  44.0   Platelets 150 - 400 K/uL 157  169  188         Latest Ref Rng & Units 04/03/2024   11:13 AM 03/13/2024   10:44 AM 02/21/2024   10:41 AM  CMP  Glucose 70 - 99 mg/dL 93  80  891   BUN 8 - 23 mg/dL 12  10  9    Creatinine 0.44 - 1.00 mg/dL 9.42  9.51  9.50   Sodium 135 - 145 mmol/L 134  138  136   Potassium 3.5 - 5.1 mmol/L 4.2  3.9  3.8   Chloride 98 - 111 mmol/L 98  102  99   CO2 22 - 32 mmol/L 32  33  32   Calcium 8.9 - 10.3 mg/dL 9.3  9.3  9.5   Total Protein 6.5 - 8.1 g/dL 6.7  6.5  6.8   Total Bilirubin 0.0 - 1.2 mg/dL 0.7  0.6  0.8   Alkaline Phos 38 - 126 U/L 67  78  64   AST 15 - 41 U/L 15  17  13    ALT 0 - 44 U/L 10  15  7        RADIOGRAPHIC STUDIES: I have personally reviewed the radiological images as listed and agreed with the findings in the report. No results found.    No orders of the defined types were placed in this encounter.  All questions were answered. The patient knows to call the clinic with any problems, questions or concerns. No barriers to learning was detected. The total time spent in the appointment was 25 minutes, including review of chart and various tests results, discussions about plan of care and coordination of care plan  Onita Mattock, MD 04/03/2024

## 2024-04-03 NOTE — Patient Instructions (Signed)
 [facility] - A DEPT OF [facility]  Discharge Instructions: Thank you for choosing [facility] to provide your oncology and hematology care.   If you have a lab appointment with the Cancer Center, please go directly to the Cancer Center and check in at the registration area.   Wear comfortable clothing and clothing appropriate for easy access to any Portacath or PICC line.   We strive to give you quality time with your provider. You may need to reschedule your appointment if you arrive late (15 or more minutes).  Arriving late affects you and other patients whose appointments are after yours.  Also, if you miss three or more appointments without notifying the office, you may be dismissed from the clinic at the provider's discretion.      For prescription refill requests, have your pharmacy contact our office and allow 72 hours for refills to be completed.    Today you received the following chemotherapy and/or immunotherapy agents: Kanjinti.       To help prevent nausea and vomiting after your treatment, we encourage you to take your nausea medication as directed.  BELOW ARE SYMPTOMS THAT SHOULD BE REPORTED IMMEDIATELY: *FEVER GREATER THAN 100.4 F (38 C) OR HIGHER *CHILLS OR SWEATING *NAUSEA AND VOMITING THAT IS NOT CONTROLLED WITH YOUR NAUSEA MEDICATION *UNUSUAL SHORTNESS OF BREATH *UNUSUAL BRUISING OR BLEEDING *URINARY PROBLEMS (pain or burning when urinating, or frequent urination) *BOWEL PROBLEMS (unusual diarrhea, constipation, pain near the anus) TENDERNESS IN MOUTH AND THROAT WITH OR WITHOUT PRESENCE OF ULCERS (sore throat, sores in mouth, or a toothache) UNUSUAL RASH, SWELLING OR PAIN  UNUSUAL VAGINAL DISCHARGE OR ITCHING   Items with * indicate a potential emergency and should be followed up as soon as possible or go to the Emergency Department if any problems should occur.  Please show the CHEMOTHERAPY ALERT CARD or IMMUNOTHERAPY  ALERT CARD at check-in to the Emergency Department and triage nurse.  Should you have questions after your visit or need to cancel or reschedule your appointment, please contact [facility] - A DEPT OF [facility]  Dept: [phone]  and follow the prompts.  Office hours are 8:00 a.m. to 4:30 p.m. Monday - Friday. Please note that voicemails left after 4:00 p.m. may not be returned until the following business day.  We are closed weekends and major holidays. You have access to a nurse at all times for urgent questions. Please call the main number to the clinic Dept: [phone] and follow the prompts.   For any non-urgent questions, you may also contact your provider using MyChart. We now offer e-Visits for anyone 70 and older to request care online for non-urgent symptoms. For details visit [email or web address].   Also download the MyChart app! Go to the app store, search "MyChart", open the app, select [facility], and log in with your MyChart username and password.  Zoledronic Acid Injection (Cancer) What is this medication? ZOLEDRONIC ACID (ZOE le dron ik AS id) treats high calcium levels in the blood caused by cancer. It may also be used with chemotherapy to treat weakened bones caused by cancer. It works by slowing down the release of calcium from bones. This lowers calcium levels in your blood. It also makes your bones stronger and less likely to break (fracture). It belongs to a group of medications called bisphosphonates. This medicine may be used for other purposes; ask your health care  provider or pharmacist if you have questions. COMMON BRAND NAME(S): Zometa, Zometa Powder What should I tell my care team before I take this medication? They need to know if you have any of these conditions: Dehydration Dental disease Kidney disease Liver disease Low levels of calcium in the blood Lung or breathing disease, such as asthma Receiving steroids, such as  dexamethasone or prednisone An unusual or allergic reaction to zoledronic acid, other medications, foods, dyes, or preservatives Pregnant or trying to get pregnant Breast-feeding How should I use this medication? This medication is injected into a vein. It is given by your care team in a hospital or clinic setting. Talk to your care team about the use of this medication in children. Special care may be needed. Overdosage: If you think you have taken too much of this medicine contact a poison control center or emergency room at once. NOTE: This medicine is only for you. Do not share this medicine with others. What if I miss a dose? Keep appointments for follow-up doses. It is important not to miss your dose. Call your care team if you are unable to keep an appointment. What may interact with this medication? Certain antibiotics given by injection Diuretics, such as bumetanide, furosemide NSAIDs, medications for pain and inflammation, such as ibuprofen or naproxen Teriparatide Thalidomide This list may not describe all possible interactions. Give your health care provider a list of all the medicines, herbs, non-prescription drugs, or dietary supplements you use. Also tell them if you smoke, drink alcohol, or use illegal drugs. Some items may interact with your medicine. What should I watch for while using this medication? Visit your care team for regular checks on your progress. It may be some time before you see the benefit from this medication. Some people who take this medication have severe bone, joint, or muscle pain. This medication may also increase your risk for jaw problems or a broken thigh bone. Tell your care team right away if you have severe pain in your jaw, bones, joints, or muscles. Tell you care team if you have any pain that does not go away or that gets worse. Tell your dentist and dental surgeon that you are taking this medication. You should not have major dental surgery  while on this medication. See your dentist to have a dental exam and fix any dental problems before starting this medication. Take good care of your teeth while on this medication. Make sure you see your dentist for regular follow-up appointments. You should make sure you get enough calcium and vitamin D while you are taking this medication. Discuss the foods you eat and the vitamins you take with your care team. Check with your care team if you have severe diarrhea, nausea, and vomiting, or if you sweat a lot. The loss of too much body fluid may make it dangerous for you to take this medication. You may need bloodwork while taking this medication. Talk to your care team if you wish to become pregnant or think you might be pregnant. This medication can cause serious birth defects. What side effects may I notice from receiving this medication? Side effects that you should report to your care team as soon as possible: Allergic reactions--skin rash, itching, hives, swelling of the face, lips, tongue, or throat Kidney injury--decrease in the amount of urine, swelling of the ankles, hands, or feet Low calcium level--muscle pain or cramps, confusion, tingling, or numbness in the hands or feet Osteonecrosis of the jaw--pain, swelling, or  redness in the mouth, numbness of the jaw, poor healing after dental work, unusual discharge from the mouth, visible bones in the mouth Severe bone, joint, or muscle pain Side effects that usually do not require medical attention (report to your care team if they continue or are bothersome): Constipation Fatigue Fever Loss of appetite Nausea Stomach pain This list may not describe all possible side effects. Call your doctor for medical advice about side effects. You may report side effects to FDA at 1-800-FDA-1088. Where should I keep my medication? This medication is given in a hospital or clinic. It will not be stored at home. NOTE: This sheet is a summary. It may  not cover all possible information. If you have questions about this medicine, talk to your doctor, pharmacist, or health care provider.  2024 [employer]/Gold Standard (2021-10-07 00:00:00)

## 2024-04-03 NOTE — Progress Notes (Signed)
 Palliative Medicine [facility]  Telephone:(336) [phone] Fax:(336) [phone]   Name: Martha [name] Date: 04/03/2024 MRN: [id number]  DOB: [birth year]  Patient Care Team: [name], MD as PCP - General (Family Medicine) [name], MD as Consulting Physician (General Surgery) [name], MD as Consulting Physician (Hematology) [name], MD as Attending Physician (Radiation Oncology) [name], [name], NP as Nurse Practitioner (Nurse Practitioner)   INTERVAL HISTORY: Martha [name] is a 63 y.o. female with oncologic medical history including ER+ breast cancer (01/2018), right breast cancer (04/2021), now with metastatic disease progression involving brain and liver, pathological fractures with osseous mets s/p brain radiation. .  Palliative ask to see for symptom management and goals of care.   SOCIAL HISTORY:     reports that she has been smoking cigarettes. She has a 20 pack-year smoking history. She has never used smokeless tobacco. She reports that she does not currently use alcohol after a past usage of about 28.0 standard drinks of alcohol per week. She reports that she does not use drugs.  ADVANCE DIRECTIVES:  On file   CODE STATUS: DNR  PAST MEDICAL HISTORY: Past Medical History:  Diagnosis Date   Anxiety    Cancer ([name]) 2022   right breast LCIS   Cancer ([name]) 2019   left breast   Depression    Dysrhythmia    SVT, s/p ablation ~ 2012 in at [facility]   Ectopic pregnancy    Family history of breast cancer    Family history of melanoma    History of radiation therapy 04/22/18-06/03/18   Left Breast, left SCV, axilla 50 Gy in 25 fractions, Left breast boost 10 Gy in 5 fractions.    Hypothyroidism    Personal history of radiation therapy    PONV (postoperative nausea and vomiting)    Thyroid  disease     ALLERGIES:  is allergic to morphine and codeine.  MEDICATIONS:  Current Outpatient Medications  Medication  Sig Dispense Refill   acetaminophen  (TYLENOL ) 500 MG tablet Take 2 tablets (1,000 mg total) by mouth every 8 (eight) hours. 30 tablet 0   capecitabine  (XELODA ) 500 MG tablet TAKE 2 TABLETS TWICE DAILY FOR 7 DAYS ON, THEN 7 DAYS OFF (TAKE AFTER MEALS) 56 tablet 2   citalopram  (CELEXA ) 40 MG tablet Take 1 tablet (40 mg total) by mouth daily. 90 tablet 0   diphenoxylate -atropine  (LOMOTIL ) 2.5-0.025 MG tablet Take 1 tablet by mouth 4 (four) times daily as needed for diarrhea or loose stools. 90 tablet 3   levothyroxine  (SYNTHROID ) 75 MCG tablet Take 1 tablet (75 mcg total) by mouth daily before breakfast. 90 tablet 3   loperamide  (IMODIUM  A-D) 2 MG tablet Take 1 tablet (2 mg total) by mouth 4 (four) times daily as needed for diarrhea or loose stools. 90 tablet 0   Mouthwashes (MOUTH RINSE) LIQD solution 15 mLs by Mouth Rinse route as needed (for oral care).  0   ondansetron  (ZOFRAN ) 8 MG tablet Take 1 tablet (8 mg total) by mouth every 8 (eight) hours as needed for nausea or vomiting. 20 tablet 2   OVER THE COUNTER MEDICATION Take 1 tablet by mouth daily. Protandim Supplement     pregabalin  (LYRICA ) 25 MG capsule Take 1 capsule (25 mg total) by mouth 3 (three) times daily. 90 capsule 3   tucatinib  (TUKYSA ) 150 MG tablet Take 2 tablets (300 mg total) by mouth 2 (two) times daily. Take as instructed per MD.  120 tablet 2   No current facility-administered medications for this visit.    VITAL SIGNS: There were no vitals taken for this visit. There were no vitals filed for this visit.   Estimated body mass index is 13.19 kg/m as calculated from the following:   Height as of an earlier encounter on 04/03/24: 5' 2 (1.575 m).   Weight as of an earlier encounter on 04/03/24: 72 lb 1.6 oz (32.7 kg).   PERFORMANCE STATUS (ECOG) : 1 - Symptomatic but completely ambulatory   Physical Exam General: NAD Cardiovascular: regular rate and rhythm Pulmonary: normal breathing pattern Extremities: no edema, no  joint deformities Skin: no rashes Neurological: AAO x3  IMPRESSION: Discussed the use of AI scribe software for clinical note transcription with the patient, who gave verbal consent to proceed.  History of Present Illness Martha [name] is a 63 year old female with metastatic cancer who seen during infusion. She is doing well overall. Denies concerns for nausea, vomiting, constipation, or diarrhea. Patient shares recent family trip to the mountains which she enjoyed.    Occasional fatigue however she is remaining as active as possible. Pain and neuropathic symptoms are well controlled on Pregabalin . No adjustments to regimen at this time.   All symptoms well managed. All questions answered and support provided.   I discussed the importance of continued conversation with family and their medical providers regarding overall plan of care and treatment options, ensuring decisions are within the context of the patients values and GOCs. Assessment & Plan  Neuropathy Pregabalin  dosing tolerated. Discomfort much improved. - Continue pregabalin  dosage to one capsule (25mg ) three times a day.  Quality of Life QOL much improved. Decreased symptom burden.   I will see patient back in 3-4 weeks. Sooner if needed.   Patient expressed understanding and was in agreement with this plan. She also understands that She can call the clinic at any time with any questions, concerns, or complaints.   Any controlled substances utilized were prescribed in the context of palliative care. PDMP has been reviewed.   Visit consisted of counseling and education dealing with the complex and emotionally intense issues of symptom management and palliative care in the setting of serious and potentially life-threatening illness.  Levon Borer, AGPCNP-BC  Palliative Medicine Team/[facility]

## 2024-04-04 ENCOUNTER — Telehealth: Payer: Self-pay | Admitting: *Deleted

## 2024-04-04 NOTE — Telephone Encounter (Addendum)
 Medication Prior Authorization Status  Processed CoverMyMeds KEY: [id number] for Tukysa  150 mg tablets  Submitted Today.  Denied 04/08/2024. [id number]  Per [employer]  [place] Case ID: [id number]   Effective 04/09/2023 through current/ongoing.  Reason: Coverage is provided in situations where the patient has human epidermal growth  factor receptor 2 (HER2)-positive disease. Coverage cannot be authorized at this time.

## 2024-04-10 ENCOUNTER — Ambulatory Visit
Admission: RE | Admit: 2024-04-10 | Discharge: 2024-04-10 | Disposition: A | Discharge status: Home / Self Care | Source: Ambulatory Visit | Attending: Internal Medicine | Admitting: Internal Medicine

## 2024-04-10 ENCOUNTER — Encounter: Payer: Self-pay | Admitting: Hematology

## 2024-04-10 DIAGNOSIS — C7951 Secondary malignant neoplasm of bone: Secondary | ICD-10-CM

## 2024-04-10 MED ORDER — GADOPICLENOL 0.5 MMOL/ML IV SOLN
3.0000 mL | Freq: Once | INTRAVENOUS | Status: AC | PRN
  Administered 2024-04-10: 3 mL via INTRAVENOUS

## 2024-04-11 ENCOUNTER — Telehealth: Payer: Self-pay | Admitting: Pharmacist

## 2024-04-11 NOTE — Telephone Encounter (Signed)
 Oral Oncology Pharmacist Encounter  Notified by Dr. Lanny that patient's Tukysa  had been denied by patietn's insurance.   I called [employer] and re-did prior authorization via phone (tele: [phone]) as patient does meet all criteria to be on this therapy and continue this therapy.  Prior authorization for Tukysa  has been approved from 03/12/24-04/11/25 (case ID: [id number]).  Patient will need to continue filling Tukysa  through [facility].    Asberry Macintosh, PharmD, BCPS, BCOP Hematology/Oncology Clinical Pharmacist [phone] 04/11/2024 9:07 AM

## 2024-04-14 ENCOUNTER — Encounter

## 2024-04-15 ENCOUNTER — Inpatient Hospital Stay (HOSPITAL_BASED_OUTPATIENT_CLINIC_OR_DEPARTMENT_OTHER): Admitting: Internal Medicine

## 2024-04-15 VITALS — BP 123/82 | HR 85 | Temp 98.1°F | Resp 16 | Ht 62.0 in | Wt <= 1120 oz

## 2024-04-15 DIAGNOSIS — Z170 Estrogen receptor positive status [ER+]: Secondary | ICD-10-CM | POA: Diagnosis not present

## 2024-04-15 DIAGNOSIS — C7931 Secondary malignant neoplasm of brain: Secondary | ICD-10-CM

## 2024-04-15 DIAGNOSIS — Z5112 Encounter for antineoplastic immunotherapy: Secondary | ICD-10-CM | POA: Diagnosis not present

## 2024-04-15 DIAGNOSIS — C7951 Secondary malignant neoplasm of bone: Secondary | ICD-10-CM | POA: Diagnosis not present

## 2024-04-15 DIAGNOSIS — F17210 Nicotine dependence, cigarettes, uncomplicated: Secondary | ICD-10-CM | POA: Diagnosis not present

## 2024-04-15 DIAGNOSIS — C50312 Malignant neoplasm of lower-inner quadrant of left female breast: Secondary | ICD-10-CM | POA: Diagnosis not present

## 2024-04-15 DIAGNOSIS — Z79899 Other long term (current) drug therapy: Secondary | ICD-10-CM | POA: Diagnosis not present

## 2024-04-15 NOTE — Progress Notes (Signed)
 [facility] at [facility] [address]  [place], [place] [address] [phone]   Interval Evaluation  Date of Service: 04/15/24 Patient Name: Martha [name] Patient MRN: [id number] Patient DOB: 03-Nov-1960 Provider: Arthea MARLA Manns, MD  Identifying Statement:  Martha [name] is a 63 y.o. female with Secondary cancer of brain [facility])    Primary Cancer:  Oncologic History: Oncology History Overview Note  Cancer Staging Malignant neoplasm of lower-inner quadrant of left breast in female, estrogen receptor positive ([name]) Staging form: Breast, AJCC 8th Edition - Clinical stage from 01/16/2018: Stage IA (cT1c, cN0, cM0, G2, ER+, PR+, HER2-) - Signed by [name], MD on 01/23/2018 - Pathologic: Stage IA (pT1c, pN1, cM0, G1, ER+, PR+, HER2-) - Signed by [name], MD on 04/09/2018     Malignant neoplasm of lower-inner quadrant of left breast in female, estrogen receptor positive ([name])  01/15/2018 Mammogram   Diagnositc Mammogram 01/15/18  IMPRESSION: 1. Suspicious 1.2 x 1.4 x 1.3 cm mixed echogenicity mass left breast 7 o'clock position retroareolar location at the site of palpable concern.. 2. Indeterminate Within the left breast 7:30 o'clock retroareolar location, adjacent to the palpable mass, is a 0.5 x 0.4 x 0.5 cm oval circumscribed hypoechoic mass. 3. Indeterminate calcifications within the lateral left breast. Location of these calcifications is not definitely confirmed on the true lateral view.    01/16/2018 Initial Biopsy   Diagnosis 01/16/18 1. Breast, left, needle core biopsy, 7:30 o'clock (ribbon clip) - FIBROCYSTIC CHANGES WITH SCLEROSING ADENOSIS AND CALCIFICATIONS. - FIBROADENOMATOID CHANGE. - NO MALIGNANCY IDENTIFIED. 2. Breast, left, needle core biopsy, 7 o'clock position (coil clip) - INVASIVE MAMMARY CARCINOMA, MSBR GRADE I/II. - SEE MICROSCOPIC DESCRIPTION Microscopic Comment  ADDENDUM: Immunohistochemistry for  E-Cadherin is strongly positive in the tumor consistent with ductal carcinoma. (JDP:ah 01/17/18)   01/16/2018 Receptors her2   Estrogen Receptor: 100%, POSITIVE, STRONG STAINING INTENSITY Progesterone Receptor: 50%, POSITIVE, STRONG STAINING INTENSITY Proliferation Marker Ki67: 20% HER2 Negative   01/16/2018 Cancer Staging   Staging form: Breast, AJCC 8th Edition - Clinical stage from 01/16/2018: Stage IA (cT1c, cN0, cM0, G2, ER+, PR+, HER2-) - Signed by [name], MD on 01/23/2018   01/22/2018 Initial Diagnosis   Malignant neoplasm of lower-inner quadrant of left breast in female, estrogen receptor positive ([name])   02/21/2018 Surgery    LEFT BREAST LUMPECTOMY WITH AXILLARY LYMPH NODE BIOPSY by Dr. Aron  02/21/18   02/21/2018 Pathology Results   Diagnosis 02/21/18 1. Breast, lumpectomy, Left - INVASIVE DUCTAL CARCINOMA, GRADE I, 1.6 CM. - DUCTAL CARCINOMA IN SITU, INTERMEDIATE NUCLEAR GRADE. - ANTERIOR AND MEDIAL RESECTION MARGINS ARE POSITIVE FOR CARCINOMA. - NEGATIVE FOR LYMPHOVASCULAR OR PERINEURAL INVASION. - BACKGROUND BREAST TISSUE WITH FIBROCYSTIC CHANGE, INCLUDING SCLEROSING ADENOSIS. - BIOPSY SITE CHANGES. - SEE ONCOLOGY TABLE. 2. Lymph node, sentinel, biopsy, Left Axillary #1 - METASTATIC BREAST CARCINOMA TO A LYMPH NODE, 1.0 CM IN GREATEST DIMENSION, WITH EXTRANODAL EXTENSION (1/1). 3. Lymph node, sentinel, biopsy, Left Axillary #2 - LYMPH NODE, NEGATIVE FOR CARCINOMA (0/1).    02/21/2018 Miscellaneous   Mammaprint 02/21/18 Low Risk with 10-year risk of recurrnce at 10% -No potential signifcant chemotherapy benefit   03/20/2018 Pathology Results   RE-EXCISION OF BREAST LUMPECTOMY by Dr. Aron  Diagnosis 03/20/18 1. Breast, excision, Left new anterior margin - FIBROCYSTIC CHANGES WITH ADENOSIS AND CALCIFICATIONS. - HEALING BIOPSY SITE. - THERE IS NO EVIDENCE OF MALIGNANCY. 2. Breast, excision, Left new medial margin - FIBROCYSTIC CHANGES WITH ADENOSIS AND  CALCIFICATIONS. - HEALING BIOPSY SITE. - THERE IS NO EVIDENCE OF MALIGNANCY. Microscopic Comment 1. -2. The surgical resection margin(s) of the specimen were inked and microscopically evaluated. (JBK:kh 03-22-18)   04/09/2018 Cancer Staging   Staging form: Breast, AJCC 8th Edition - Pathologic: Stage IA (pT1c, pN1, cM0, G1, ER+, PR+, HER2-) - Signed by [name], MD on 04/09/2018   04/22/2018 - 06/03/2018 Radiation Therapy   Radaiton with Dr. Izell 04/22/18-06/03/18   05/2018 -  Anti-estrogen oral therapy   Letrozole  2.5mg  started 05/2018    Survivorship   Per Mayme Silversmith, NP    05/04/2022 Imaging    IMPRESSION: Cervical spondylosis, as described.   Nonspecific straightening of the expected cervical lordosis.   07/24/2022 Imaging    IMPRESSION: 1. Extensive osseous metastatic disease with pathologic fracture at the base of dens and C2 right lateral mass. Extraosseous tumor at C1 and C2 likely impinging on the right C2 and C3 nerve roots. 2. Degenerative cord impingement at C4-5 to C6-7. Biforaminal impingement at C5-6 and C6-7.   08/03/2022 PET scan    IMPRESSION: 1. Large volume osseous metastasis. 2. Low right cervical and probable right axillary nodal metastasis. 3. Subtle heterogeneous activity throughout the liver with suggestion of small liver lesions (likely new compared to chest CT of 07/16/2020). Findings are overall moderately suspicious for hepatic metastasis. Pre and post contrast abdominal MRI (preferred) or CT could confirm. 4. Right-sided pleural thickening and trace pleural fluid. Right base airspace disease is favored to represent chronic atelectasis. 5. Aortic atherosclerosis (ICD10-I70.0) and emphysema (ICD10-J43.9).     08/11/2022 Genetic Testing   Negative genetic testing on the CancerNext-Expanded+RNAinsight panel.  The report date is August 11, 2022.  The CancerNext-Expanded gene panel offered by [facility] and includes sequencing and  rearrangement analysis for the following 77 genes: AIP, ALK, APC*, ATM*, AXIN2, BAP1, BARD1, BLM, BMPR1A, BRCA1*, BRCA2*, BRIP1*, CDC73, CDH1*, CDK4, CDKN1B, CDKN2A, CHEK2*, CTNNA1, DICER1, FANCC, FH, FLCN, GALNT12, KIF1B, LZTR1, MAX, MEN1, MET, MLH1*, MSH2*, MSH3, MSH6*, MUTYH*, NBN, NF1*, NF2, NTHL1, PALB2*, PHOX2B, PMS2*, POT1, PRKAR1A, PTCH1, PTEN*, RAD51C*, RAD51D*, RB1, RECQL, RET, SDHA, SDHAF2, SDHB, SDHC, SDHD, SMAD4, SMARCA4, SMARCB1, SMARCE1, STK11, SUFU, TMEM127, TP53*, TSC1, TSC2, VHL and XRCC2 (sequencing and deletion/duplication); EGFR, EGLN1, HOXB13, KIT, MITF, PDGFRA, POLD1, and POLE (sequencing only); EPCAM and GREM1 (deletion/duplication only). DNA and RNA analyses performed for * genes.    Lobular carcinoma in situ (LCIS) of right breast  01/20/2020 Mammogram   Diagnostic Mammogram 01/20/20 IMPRESSION: 1.  Stable post lumpectomy changes of the left breast.   2. Suspicious microcalcifications over the right upper outer quadrant spanning 3.6 cm.   02/02/2020 Initial Biopsy   Diagnosis 02/02/20 Breast, right, needle core biopsy, upper outer quadrant, x clip - LOBULAR CARCINOMA IN SITU WITH PLEOMORPHIC FEATURES AND CALCIFICATIONS, INVOLVING ADENOSIS. SEE NOTE Diagnosis Note Immunohistochemical stain for E-cadherin is negative in the lesional cells, consistent with a lobular phenotype. Immunostains for p63, SMM 1 and calponin do not show evidence of invasive carcinoma.    02/04/2020 Initial Diagnosis   Lobular carcinoma in situ (LCIS) of right breast   03/18/2020 Surgery   RIGHT BREAST LUMPECTOMY WITH RADIOACTIVE SEED LOCALIZATION by Dr Marla    03/18/2020 Pathology Results   FINAL MICROSCOPIC DIAGNOSIS:   A. BREAST, RIGHT, LUMPECTOMY:  - Pleomorphic lobular carcinoma in situ with calcifications and  underlying complex sclerosing lesion, adenosis and fibroadenomatoid  change.  - Margins of resection are not involved (Closest margins: < 1 mm,  anterior, posterior,  inferior and  medial).  - Biopsy site.    COMMENT:   P63, Calponin and SMM-1 demonstrate the presence of myoepithelium in the  select focus.    Metastatic breast cancer to bone and brain  05/04/2022 Imaging    IMPRESSION: Cervical spondylosis, as described.   Nonspecific straightening of the expected cervical lordosis.   07/24/2022 Imaging    IMPRESSION: 1. Extensive osseous metastatic disease with pathologic fracture at the base of dens and C2 right lateral mass. Extraosseous tumor at C1 and C2 likely impinging on the right C2 and C3 nerve roots. 2. Degenerative cord impingement at C4-5 to C6-7. Biforaminal impingement at C5-6 and C6-7.   08/02/2022 Initial Diagnosis   Cancer, metastatic to bone ([name])   08/03/2022 PET scan    IMPRESSION: 1. Large volume osseous metastasis. 2. Low right cervical and probable right axillary nodal metastasis. 3. Subtle heterogeneous activity throughout the liver with suggestion of small liver lesions (likely new compared to chest CT of 07/16/2020). Findings are overall moderately suspicious for hepatic metastasis. Pre and post contrast abdominal MRI (preferred) or CT could confirm. 4. Right-sided pleural thickening and trace pleural fluid. Right base airspace disease is favored to represent chronic atelectasis. 5. Aortic atherosclerosis (ICD10-I70.0) and emphysema (ICD10-J43.9).     10/26/2022 - 04/12/2023 Chemotherapy   Patient is on Treatment Plan : BREAST Paclitaxel  D1,8,15 + Trastuzumab  D1 + Pertuzumab  D1 q21d x 8 cycles / Trastuzumab  D1 + Pertuzumab  D1 q21d x 4 cycles     05/03/2023 -  Chemotherapy   Patient is on Treatment Plan : BREAST MAINTENANCE Trastuzumab  IV (6) or SQ (600) D1 q21d X 11 Cycles      CNS Oncologic History 09/20/22: [id number] with Dr. Izell 02/01/24: Widespread progression, completes WBRT Martha [name])  Interval History: Martha [name] presents today for follow up after recent MRI study, now 2 months removed from completion of  whole brain radiation.  She describes overall stability with energy, gait and balance.  She is doing some walking on her own.  Doing well with oral treatments for breast cancer with Dr. Lanny.  No headaches or seizures.   H+P (09/04/23) Patient presents today for follow up after recent MRI brain.  She describes no new or progressive neurologic deficits.  Gait is now independent.  Still experiences moderate pain in neck and lower back.  Continues to follow with Dr. Lanny for breast cancer, is taking   Medications: Current Outpatient Medications on File Prior to Visit  Medication Sig Dispense Refill   acetaminophen  (TYLENOL ) 500 MG tablet Take 2 tablets (1,000 mg total) by mouth every 8 (eight) hours. 30 tablet 0   capecitabine  (XELODA ) 500 MG tablet TAKE 2 TABLETS TWICE DAILY FOR 7 DAYS ON, THEN 7 DAYS OFF (TAKE AFTER MEALS) 56 tablet 2   citalopram  (CELEXA ) 40 MG tablet Take 1 tablet (40 mg total) by mouth daily. 90 tablet 0   diphenoxylate -atropine  (LOMOTIL ) 2.5-0.025 MG tablet Take 1 tablet by mouth 4 (four) times daily as needed for diarrhea or loose stools. 90 tablet 3   levothyroxine  (SYNTHROID ) 75 MCG tablet Take 1 tablet (75 mcg total) by mouth daily before breakfast. 90 tablet 3   loperamide  (IMODIUM  A-D) 2 MG tablet Take 1 tablet (2 mg total) by mouth 4 (four) times daily as needed for diarrhea or loose stools. 90 tablet 0   Mouthwashes (MOUTH RINSE) LIQD solution 15 mLs by Mouth Rinse route as needed (for oral care).  0   ondansetron  (ZOFRAN ) 8 MG  tablet Take 1 tablet (8 mg total) by mouth every 8 (eight) hours as needed for nausea or vomiting. 20 tablet 2   OVER THE COUNTER MEDICATION Take 1 tablet by mouth daily. Protandim Supplement     pregabalin  (LYRICA ) 25 MG capsule Take 1 capsule (25 mg total) by mouth 3 (three) times daily. 90 capsule 3   tucatinib  (TUKYSA ) 150 MG tablet Take 2 tablets (300 mg total) by mouth 2 (two) times daily. Take as instructed per MD. 120 tablet 2   No current  facility-administered medications on file prior to visit.    Allergies:  Allergies  Allergen Reactions   Morphine And Codeine Other (See Comments)    Makes her angry   Past Medical History:  Past Medical History:  Diagnosis Date   Anxiety    Cancer ([name]) 2022   right breast LCIS   Cancer ([name]) 2019   left breast   Depression    Dysrhythmia    SVT, s/p ablation ~ 2012 in at [facility]   Ectopic pregnancy    Family history of breast cancer    Family history of melanoma    History of radiation therapy 04/22/18-06/03/18   Left Breast, left SCV, axilla 50 Gy in 25 fractions, Left breast boost 10 Gy in 5 fractions.    Hypothyroidism    Personal history of radiation therapy    PONV (postoperative nausea and vomiting)    Thyroid  disease    Past Surgical History:  Past Surgical History:  Procedure Laterality Date   APPENDECTOMY     BILATERAL SALPINGECTOMY     BREAST BIOPSY Right 2014   fibroadenoma   BREAST BIOPSY Left 01/16/2018   BREAST BIOPSY Right 02/02/2020   LCIS   BREAST BIOPSY Right 08/04/2020   x2 LCIS   BREAST BIOPSY Right 08/13/2020   x2   BREAST EXCISIONAL BIOPSY Left 1990   benign   BREAST EXCISIONAL BIOPSY Right 02/02/2020   LCIS   BREAST LUMPECTOMY Left 01/22/2018   BREAST LUMPECTOMY WITH AXILLARY LYMPH NODE BIOPSY Left 02/21/2018   Procedure: LEFT BREAST LUMPECTOMY WITH AXILLARY LYMPH NODE BIOPSY;  Surgeon: [name], MD;  Location: [facility] OR;  Service: General;  Laterality: Left;   BREAST LUMPECTOMY WITH RADIOACTIVE SEED LOCALIZATION Right 03/18/2020   Procedure: RIGHT BREAST LUMPECTOMY WITH RADIOACTIVE SEED LOCALIZATION;  Surgeon: [name], MD;  Location: [facility] OR;  Service: General;  Laterality: Right;   BREAST SURGERY Left 1993   cyst removed   ENDOMETRIAL ABLATION     EYE SURGERY     GLAUCOMA SURGERY Bilateral    IR IMAGING GUIDED PORT INSERTION  10/16/2022   POSTERIOR CERVICAL FUSION/FORAMINOTOMY N/A 08/17/2022   Procedure:  Cervical One-Cervical Four POSTERIOR CERVICAL FUSION,  Cervical One LAMINECTOMY REDUCTION OF Cervical Two Fracture, Biopsy of Right Cerical Two Pars  Lesion;  Surgeon: Debby Dorn MATSU, MD;  Location: [facility] OR;  Service: Neurosurgery;  Laterality: N/A;   POSTERIOR CERVICAL FUSION/FORAMINOTOMY N/A 09/06/2022   Procedure: Posterior Cervical Fusion, Foraminotomy , Cervical Five-Six, Cervical Six-Seven; Extension of  fusion Cervical Four- Thoracic One;  Surgeon: Debby Dorn MATSU, MD;  Location: [facility] OR;  Service: Neurosurgery;  Laterality: N/A;   RADIOACTIVE SEED GUIDED EXCISIONAL BREAST BIOPSY Right 04/28/2021   Procedure: RADIOACTIVE SEED GUIDED EXCISIONAL RIGHT BREAST BIOPSY X2;  Surgeon: [name], MD;  Location: [facility];  Service: General;  Laterality: Right;   RE-EXCISION OF BREAST LUMPECTOMY Left 03/20/2018   Procedure: RE-EXCISION OF BREAST LUMPECTOMY;  Surgeon:  [name], MD;  Location: [facility];  Service: General;  Laterality: Left;   Social History:  Social History   Socioeconomic History   Marital status: Married    Spouse name: Not on file   Number of children: 0   Years of education: Not on file   Highest education level: Not on file  Occupational History   Not on file  Tobacco Use   Smoking status: Every Day    Current packs/day: 0.50    Average packs/day: 0.5 packs/day for 40.0 years (20.0 ttl pk-yrs)    Types: Cigarettes   Smokeless tobacco: Never  Vaping Use   Vaping status: Former   Quit date: 09/27/2013  Substance and Sexual Activity   Alcohol use: Not Currently    Alcohol/week: 28.0 standard drinks of alcohol    Types: 28 Cans of beer per week    Comment: drinks 4 beers daily   Drug use: No   Sexual activity: Yes    Birth control/protection: Post-menopausal    Comment: endometrial ablasion  Other Topics Concern   Not on file  Social History Narrative   1 stepdaughter   Social Drivers of Health   Financial Resource  Strain: Not on file  Food Insecurity: No Food Insecurity (02/21/2024)   Hunger Vital Sign    Worried About Running Out of Food in the Last Year: Never true    Ran Out of Food in the Last Year: Never true  Transportation Needs: No Transportation Needs (02/21/2024)   PRAPARE - Administrator, Civil Service (Medical): No    Lack of Transportation (Non-Medical): No  Physical Activity: Not on file  Stress: Not on file  Social Connections: Not on file  Intimate Partner Violence: Not At Risk (02/21/2024)   Humiliation, Afraid, Rape, and Kick questionnaire    Fear of Current or Ex-Partner: No    Emotionally Abused: No    Physically Abused: No    Sexually Abused: No   Family History:  Family History  Problem Relation Age of Onset   Heart disease Mother    Pneumonia Father    Hyperlipidemia Sister    Melanoma Sister 43       hx of 5 melanomas   Lung cancer Maternal Uncle        mesothelioma   Pneumonia Maternal Uncle    Dementia Paternal Grandmother    Lung disease Paternal Grandfather    Breast cancer Other        MGMs sister dx < 50   Breast cancer Other        PGFs sister dx < 50   Breast cancer Cousin        mother's mat first cousin, dx < 50    Review of Systems: Constitutional: Doesn't report fevers, chills or abnormal weight loss Eyes: Doesn't report blurriness of vision Ears, nose, mouth, throat, and face: Doesn't report sore throat Respiratory: Doesn't report cough, dyspnea or wheezes Cardiovascular: Doesn't report palpitation, chest discomfort  Gastrointestinal:  Doesn't report nausea, constipation, diarrhea GU: Doesn't report incontinence Skin: Doesn't report skin rashes Neurological: Per HPI Musculoskeletal: Doesn't report joint pain Behavioral/Psych: Doesn't report anxiety  Physical Exam: Vitals:   04/15/24 0913  BP: 123/82  Pulse: 85  Resp: 16  Temp: 98.1 F (36.7 C)  SpO2: 100%   KPS: 90. General: Alert, cooperative, pleasant, in no acute  distress Head: Normal EENT: No conjunctival injection or scleral icterus.  Lungs: Resp effort normal Cardiac: Regular rate Abdomen:  Non-distended abdomen Skin: No rashes cyanosis or petechiae. Extremities: No clubbing or edema  Neurologic Exam: Mental Status: Awake, alert, attentive to examiner. Oriented to self and environment. Language is fluent with intact comprehension.  Cranial Nerves: Visual acuity is grossly normal. Visual fields are full. Extra-ocular movements intact. No ptosis. Face is symmetric Motor: Tone and bulk are normal. Power is full in both arms and legs. Reflexes are symmetric, no pathologic reflexes present.  Sensory: Intact to light touch Gait: Normal.   Labs: I have reviewed the data as listed    Component Value Date/Time   NA 134 (L) 04/03/2024 1113   K 4.2 04/03/2024 1113   CL 98 04/03/2024 1113   CO2 32 04/03/2024 1113   GLUCOSE 93 04/03/2024 1113   BUN 12 04/03/2024 1113   CREATININE 0.57 04/03/2024 1113   CALCIUM 9.3 04/03/2024 1113   PROT 6.7 04/03/2024 1113   ALBUMIN 4.6 04/03/2024 1113   AST 15 04/03/2024 1113   ALT 10 04/03/2024 1113   ALKPHOS 67 04/03/2024 1113   BILITOT 0.7 04/03/2024 1113   GFRNONAA >60 04/03/2024 1113   GFRAA >60 05/21/2020 1350   Lab Results  Component Value Date   WBC 4.1 04/03/2024   NEUTROABS 3.0 04/03/2024   HGB 15.1 (H) 04/03/2024   HCT 42.5 04/03/2024   MCV 97.9 04/03/2024   PLT 157 04/03/2024    Imaging:  MR BRAIN W WO CONTRAST Result Date: 04/10/2024 CLINICAL DATA:  63 year old female with metastatic breast cancer. History of brain metastases 1st diagnosed in 2023, treated. Subsequent punctate and innumerable enhancing lesions. Restaging. EXAM: MRI HEAD WITHOUT AND WITH CONTRAST TECHNIQUE: Multiplanar, multiecho pulse sequences of the brain and surrounding structures were obtained without and with intravenous contrast. CONTRAST:  7 mL Vueway  COMPARISON:  Brain MRI 12/27/2023 and earlier. FINDINGS: Brain:  Mild motion artifact on MP rage axial and coronal, sagittal postcontrast brain imaging. On those images confluent and petechial appearing enhancement in the bilateral basal ganglia not significantly changed since 12/27/2023 (series 14, image 105 and similar to other prior exams. Postcontrast black blood series 13. Widespread punctate, miliary type enhancement pattern, which is perhaps most pronounced in the bilateral cerebellum, but widespread involvement of brain parenchyma. Chronic left lateral cerebellar lesion present since 2023 is rim enhancing and stable, 7 mm. Some of the cerebellar enhancement resembles early leptomeningeal disease as in May (series 13, image 50). No other dural thickening or enhancement identified. When compared to black blood imaging 12/27/2023 no significant change, including in dominant right lentiform (series 13, image 104) and posterosuperior temporal lobe (image 114) lesions which were measured in May. On FLAIR imaging no significant cerebral edema. No intracranial mass effect, midline shift. No superimposed restricted diffusion to suggest acute infarction. Noventriculomegaly, extra-axial collection or acute intracranial hemorrhage. Cervicomedullary junction and pituitary are within normal limits. Vascular: Major intracranial vascular flow voids are stable. Distal left vertebral artery appears to be dominant. Following contrast major dural venous sinuses are enhancing and appear to be patent. Skull and upper cervical spine: Chronic postoperative changes, posterior fusion hardware throughout the visible cervical spine. Visible bone marrow signal appears stable, heterogeneous, but not progressive since May. Hardware artifact limits postcontrast visualization of the cervical spinal cord. Sinuses/Orbits: Stable and negative. Other: Mild right mastoid effusion is new. Negative visible nasopharynx. Other visible internal auditory structures appear stable and negative. Negative visible scalp  and face. IMPRESSION: 1. Stable since May. Innumerable punctate / miliary-pattern of abnormal enhancement throughout the brain not significantly changed. Dominant (  subcentimeter) lesions are stable. And no cerebral edema or intracranial mass effect. As before, early cerebellar leptomeningeal involvement difficult to exclude. 2. No new intracranial abnormality identified. Electronically Signed   By: VEAR Hurst M.D.   On: 04/10/2024 11:35    [facility] Clinician Interpretation: I have personally reviewed the radiological images as listed.  My interpretation, in the context of the patient's clinical presentation, is stable disease   Assessment/Plan Secondary cancer of brain ([name])  Martha [name] is clinically stable today, now having completed salvage WBRT.  MRI brain demonstrated stable findings overall.  Recommended continuing imaging surveillance at this time.  We appreciate the opportunity to participate in the care of Martha [name].   We ask that Martha [name] return to clinic in 3  months following next brain MRI, or sooner as needed.  All questions were answered. The patient knows to call the clinic with any problems, questions or concerns. No barriers to learning were detected.  The total time spent in the encounter was 40 minutes and more than 50% was on counseling and review of test results   Arthea MARLA Manns, MD Medical Director of Neuro-Oncology [facility] 04/15/24 9:57 AM

## 2024-04-16 ENCOUNTER — Telehealth: Payer: Self-pay | Admitting: Nurse Practitioner

## 2024-04-16 NOTE — Telephone Encounter (Signed)
 Scheduled appointment per 8/19 los. Called and left VM with appointment details for the patient.

## 2024-04-18 ENCOUNTER — Other Ambulatory Visit: Payer: Self-pay | Admitting: Radiation Therapy

## 2024-04-24 ENCOUNTER — Other Ambulatory Visit

## 2024-04-24 ENCOUNTER — Inpatient Hospital Stay

## 2024-04-24 ENCOUNTER — Ambulatory Visit: Admitting: Hematology

## 2024-04-24 ENCOUNTER — Ambulatory Visit

## 2024-04-24 VITALS — BP 121/77 | HR 82 | Temp 98.8°F | Resp 16 | Wt 72.5 lb

## 2024-04-24 DIAGNOSIS — C7931 Secondary malignant neoplasm of brain: Secondary | ICD-10-CM | POA: Diagnosis not present

## 2024-04-24 DIAGNOSIS — F17210 Nicotine dependence, cigarettes, uncomplicated: Secondary | ICD-10-CM | POA: Diagnosis not present

## 2024-04-24 DIAGNOSIS — C7951 Secondary malignant neoplasm of bone: Secondary | ICD-10-CM | POA: Diagnosis not present

## 2024-04-24 DIAGNOSIS — Z170 Estrogen receptor positive status [ER+]: Secondary | ICD-10-CM | POA: Diagnosis not present

## 2024-04-24 DIAGNOSIS — Z95828 Presence of other vascular implants and grafts: Secondary | ICD-10-CM

## 2024-04-24 DIAGNOSIS — C50312 Malignant neoplasm of lower-inner quadrant of left female breast: Secondary | ICD-10-CM | POA: Diagnosis not present

## 2024-04-24 DIAGNOSIS — Z79899 Other long term (current) drug therapy: Secondary | ICD-10-CM | POA: Diagnosis not present

## 2024-04-24 DIAGNOSIS — Z5112 Encounter for antineoplastic immunotherapy: Secondary | ICD-10-CM | POA: Diagnosis not present

## 2024-04-24 LAB — CBC WITH DIFFERENTIAL (CANCER CENTER ONLY)
Abs Immature Granulocytes: 0.05 K/uL (ref 0.00–0.07)
Basophils Absolute: 0 K/uL (ref 0.0–0.1)
Basophils Relative: 1 %
Eosinophils Absolute: 0.1 K/uL (ref 0.0–0.5)
Eosinophils Relative: 1 %
HCT: 41.2 % (ref 36.0–46.0)
Hemoglobin: 14.6 g/dL (ref 12.0–15.0)
Immature Granulocytes: 1 %
Lymphocytes Relative: 14 %
Lymphs Abs: 0.7 K/uL (ref 0.7–4.0)
MCH: 35.9 pg — ABNORMAL HIGH (ref 26.0–34.0)
MCHC: 35.4 g/dL (ref 30.0–36.0)
MCV: 101.2 fL — ABNORMAL HIGH (ref 80.0–100.0)
Monocytes Absolute: 0.4 K/uL (ref 0.1–1.0)
Monocytes Relative: 9 %
Neutro Abs: 3.8 K/uL (ref 1.7–7.7)
Neutrophils Relative %: 74 %
Platelet Count: 147 K/uL — ABNORMAL LOW (ref 150–400)
RBC: 4.07 MIL/uL (ref 3.87–5.11)
RDW: 16.7 % — ABNORMAL HIGH (ref 11.5–15.5)
WBC Count: 5.1 K/uL (ref 4.0–10.5)
nRBC: 0 % (ref 0.0–0.2)

## 2024-04-24 LAB — CMP (CANCER CENTER ONLY)
ALT: 30 U/L (ref 0–44)
AST: 19 U/L (ref 15–41)
Albumin: 4.4 g/dL (ref 3.5–5.0)
Alkaline Phosphatase: 66 U/L (ref 38–126)
Anion gap: 3 — ABNORMAL LOW (ref 5–15)
BUN: 15 mg/dL (ref 8–23)
CO2: 31 mmol/L (ref 22–32)
Calcium: 9.4 mg/dL (ref 8.9–10.3)
Chloride: 102 mmol/L (ref 98–111)
Creatinine: 0.47 mg/dL (ref 0.44–1.00)
GFR, Estimated: >60 mL/min (ref >60)
Glucose, Bld: 95 mg/dL (ref 70–99)
Potassium: 4.1 mmol/L (ref 3.5–5.1)
Sodium: 136 mmol/L (ref 135–145)
Total Bilirubin: 0.9 mg/dL (ref 0.0–1.2)
Total Protein: 6.5 g/dL (ref 6.5–8.1)

## 2024-04-24 MED ORDER — SODIUM CHLORIDE 0.9% FLUSH
10.0000 mL | Freq: Once | INTRAVENOUS | Status: AC
  Administered 2024-04-24: 10 mL

## 2024-04-24 MED ORDER — SODIUM CHLORIDE 0.9% FLUSH: 10.0000 mL | INTRAVENOUS | Status: DC | PRN

## 2024-04-24 MED ORDER — TRASTUZUMAB-ANNS CHEMO 150 MG IV SOLR
6.0000 mg/kg | Freq: Once | INTRAVENOUS | Status: AC
  Administered 2024-04-24: 210 mg via INTRAVENOUS
  Filled 2024-04-24: qty 10

## 2024-04-24 MED ORDER — SODIUM CHLORIDE 0.9 % IV SOLN: Freq: Once | INTRAVENOUS | Status: AC

## 2024-04-24 MED ORDER — ACETAMINOPHEN 325 MG PO TABS
650.0000 mg | ORAL_TABLET | Freq: Once | ORAL | Status: AC
  Administered 2024-04-24: 650 mg via ORAL
  Filled 2024-04-24: qty 2

## 2024-04-24 NOTE — Patient Instructions (Signed)
 [facility] - A DEPT OF [facility]  Discharge Instructions: Thank you for choosing [facility] to provide your oncology and hematology care.   If you have a lab appointment with the Cancer Center, please go directly to the Cancer Center and check in at the registration area.   Wear comfortable clothing and clothing appropriate for easy access to any Portacath or PICC line.   We strive to give you quality time with your provider. You may need to reschedule your appointment if you arrive late (15 or more minutes).  Arriving late affects you and other patients whose appointments are after yours.  Also, if you miss three or more appointments without notifying the office, you may be dismissed from the clinic at the provider's discretion.      For prescription refill requests, have your pharmacy contact our office and allow 72 hours for refills to be completed.    Today you received the following chemotherapy and/or immunotherapy agents: trastuzumab -anns (KANJINTI )     To help prevent nausea and vomiting after your treatment, we encourage you to take your nausea medication as directed.  BELOW ARE SYMPTOMS THAT SHOULD BE REPORTED IMMEDIATELY: *FEVER GREATER THAN 100.4 F (38 C) OR HIGHER *CHILLS OR SWEATING *NAUSEA AND VOMITING THAT IS NOT CONTROLLED WITH YOUR NAUSEA MEDICATION *UNUSUAL SHORTNESS OF BREATH *UNUSUAL BRUISING OR BLEEDING *URINARY PROBLEMS (pain or burning when urinating, or frequent urination) *BOWEL PROBLEMS (unusual diarrhea, constipation, pain near the anus) TENDERNESS IN MOUTH AND THROAT WITH OR WITHOUT PRESENCE OF ULCERS (sore throat, sores in mouth, or a toothache) UNUSUAL RASH, SWELLING OR PAIN  UNUSUAL VAGINAL DISCHARGE OR ITCHING   Items with * indicate a potential emergency and should be followed up as soon as possible or go to the Emergency Department if any problems should occur.  Please show the CHEMOTHERAPY ALERT CARD  or IMMUNOTHERAPY ALERT CARD at check-in to the Emergency Department and triage nurse.  Should you have questions after your visit or need to cancel or reschedule your appointment, please contact [facility] - A DEPT OF [facility]  Dept: [phone]  and follow the prompts.  Office hours are 8:00 a.m. to 4:30 p.m. Monday - Friday. Please note that voicemails left after 4:00 p.m. may not be returned until the following business day.  We are closed weekends and major holidays. You have access to a nurse at all times for urgent questions. Please call the main number to the clinic Dept: [phone] and follow the prompts.   For any non-urgent questions, you may also contact your provider using MyChart. We now offer e-Visits for anyone 61 and older to request care online for non-urgent symptoms. For details visit [email or web address].   Also download the MyChart app! Go to the app store, search MyChart, open the app, select [place], and log in with your MyChart username and password.

## 2024-04-25 ENCOUNTER — Ambulatory Visit (HOSPITAL_BASED_OUTPATIENT_CLINIC_OR_DEPARTMENT_OTHER)

## 2024-04-25 DIAGNOSIS — Z0189 Encounter for other specified special examinations: Secondary | ICD-10-CM | POA: Diagnosis not present

## 2024-04-25 DIAGNOSIS — C7951 Secondary malignant neoplasm of bone: Secondary | ICD-10-CM

## 2024-04-25 LAB — ECHOCARDIOGRAM COMPLETE
AR max vel: 2.28 cm2
AV Area VTI: 2.23 cm2
AV Area mean vel: 2.24 cm2
AV Mean grad: 1 mmHg
AV Peak grad: 3 mmHg
Ao pk vel: 0.87 m/s
Area-P 1/2: 3.56 cm2
S' Lateral: 2.3 cm

## 2024-05-14 ENCOUNTER — Other Ambulatory Visit: Payer: Self-pay | Admitting: Nurse Practitioner

## 2024-05-14 DIAGNOSIS — C50312 Malignant neoplasm of lower-inner quadrant of left female breast: Secondary | ICD-10-CM

## 2024-05-14 NOTE — Assessment & Plan Note (Signed)
-  Diagnosed in 06/2022, ER-/PR-/HER2+ --PET scan from 08/03/2022 showed diffuse bone mets  -she underwent cervical laminectomy and fusion by Dr. Debby on 08/17/2022, biopsy confirmed metastatic breast cancer, ER/PR negative, HER2 positive. -due to cervical cord compression with myelopathy, she underwent a second cervical spine surgery on September 06, 2022, and completed inpatient rehabitation. -She has completed brain, cervical and lumbar spine radiation in Feb 2024 -She was recently hospitalized for altered mental status and confusion, probably secondary to medication -She started first line systemic chemotherapy with weekly Taxol  and trastuzumab /Perjeta  on 10/25/22. She has tolerated well so far and her pain has improved overall -PET scan from Jan 01, 2023 showed complete metabolic response, no hypermetabolic uptake in diffuse bone lesions, no other new lesions.  She is clinically also doing much better.  Given her excellent response to chemo, we will change her Taxol  to 2 weeks on and 1 week off -She Unfortunately developed more more brain metastasis on recent brain MRI. -I personally reviewed her restaging PET scan from April 13, 2023, which showed stable disease, no new lesions. -Due to her new brain metastasis, I changed her therapy to Tucatinib , capecitabine  and trastuzumab , for better brain penetration. She started in late August 2024, tolerating well so far.  Her case was reviewed in CNS tumor board, Dr. Izell plan to hold on whole brain radiation since she is starting new Her2 antibody and monitor her brain mets closely.  -restaging MRI brian 08/30/2023 showed partial response, PET in 06/2023 showed no hypermetabolic lesions  -brain MRI 12/27/2023 showed progressive brain mets, she started whole brain RT on 5/19 and completed on 02/01/2024  -she is also on Zometa  every 3 months -continue Tucatinib , capecitabine  and trastuzumab  -PET 02/19/2024 showed no hypermetabolic disease. - Echocardiogram  04/25/2024 -normal left and right ventricular function.  No wall motion abnormalities.  No valvular  abnormalities. - Continue capecitabine , Tucatinib , and trastuzumab  without interruption.

## 2024-05-14 NOTE — Progress Notes (Unsigned)
 Patient Care Team: [name], MD as PCP - General (Family Medicine) [name], MD as Consulting Physician (General Surgery) [name], MD as Consulting Physician (Hematology) [name], MD as Attending Physician (Radiation Oncology) [name], [name], NP as Nurse Practitioner (Nurse Practitioner)  Clinic Day:  05/15/2024  Referring physician: [name], MD  ASSESSMENT & PLAN:   Assessment & Plan: Metastatic breast cancer to bone and brain -Diagnosed in 06/2022, ER-/PR-/HER2+ --PET scan from 08/03/2022 showed diffuse bone mets  -she underwent cervical laminectomy and fusion by Dr. Debby on 08/17/2022, biopsy confirmed metastatic breast cancer, ER/PR negative, HER2 positive. -due to cervical cord compression with myelopathy, she underwent a second cervical spine surgery on September 06, 2022, and completed inpatient rehabitation. -She has completed brain, cervical and lumbar spine radiation in Feb 2024 -She was recently hospitalized for altered mental status and confusion, probably secondary to medication -She started first line systemic chemotherapy with weekly Taxol  and trastuzumab /Perjeta  on 10/25/22. She has tolerated well so far and her pain has improved overall -PET scan from Jan 01, 2023 showed complete metabolic response, no hypermetabolic uptake in diffuse bone lesions, no other new lesions.  She [name] clinically also doing much better.  Given her excellent response to chemo, we will change her Taxol  to 2 weeks on and 1 week off -She Unfortunately developed more more brain metastasis on recent brain MRI. -I personally reviewed her restaging PET scan from April 13, 2023, which showed stable disease, no new lesions. -Due to her new brain metastasis, I changed her therapy to Tucatinib , capecitabine  and trastuzumab , for better brain penetration. She started in late August 2024, tolerating well so far.  Her case was reviewed in CNS tumor board, Dr. Izell plan to hold on whole brain  radiation since she [name] starting new Her2 antibody and monitor her brain mets closely.  -restaging MRI brian 08/30/2023 showed partial response, PET in 06/2023 showed no hypermetabolic lesions  -brain MRI 12/27/2023 showed progressive brain mets, she started whole brain RT on 5/19 and completed on 02/01/2024  -she [name] also on Zometa  every 3 months -continue Tucatinib , capecitabine  and trastuzumab  -PET 02/19/2024 showed no hypermetabolic disease. - Echocardiogram 04/25/2024 -normal left and right ventricular function.  No wall motion abnormalities.  No valvular  abnormalities. - Continue capecitabine , Tucatinib , and trastuzumab  without interruption.   Metastatic breast cancer with ongoing treatment with chemotherapy  Currently on combination of capecitabine  and Tucatinib .  Managing very well with minimal side effects.  She denies nausea or vomiting.  Diarrhea and constipation managed with medications.  Blood count and CMP are both unremarkable.  Awaiting results of thyroid  panel.  Reviewed echocardiogram from August 29th.  She has normal right and left ventricular function without wall motion abnormalities.  Unremarkable valves.  Protein/calorie malnutrition Due to metastatic cancer along with treatment.  Appetite slightly improved.  She has increased protein intake since last visit.  Trying to consume 2 protein supplement shakes every day.  Has had weight gain of 3.5 pounds.  Tolerating them well.  Constipation and diarrhea are controlled with medications.  Energy improved.  Plan Labs reviewed. - CBC and CMP both unremarkable. - Awaiting results of thyroid  function. Reviewed echocardiogram from 04/25/2024. - Normal right and left ventricular function without wall motion abnormalities.  Good valvular function.  Will continue to monitor at regular intervals. Continue oral capecitabine  and Tucatinib  as prescribed. Proceed with trastuzumab  infusion today. Plan for labs, follow-up, and treatments as currently  scheduled.  The patient understands the plans  discussed today and [name] in agreement with them.  She knows to contact our office if she develops concerns prior to her next appointment.  I provided 25 minutes of face-to-face time during this encounter and > 50% was spent counseling as documented under my assessment and plan.    Powell FORBES Lessen, NP  [facility] - A DEPT OF [facility] [address] [place] [address] Dept: [phone] Dept Fax: [phone]   No orders of the defined types were placed in this encounter.     CHIEF COMPLAINT:  CC: Metastatic breast cancer to bone and brain, HER2 +  Current Treatment: Xeloda , Tucatinib , and trastuzumab  every 3 weeks  INTERVAL HISTORY:  Martha [name] here today for repeat clinical assessment. She last saw Dr. Lanny on 04/03/2024.  She had an echocardiogram on 04/25/2024.  She has normal LVEF at 60 to 65%.  There [name] no left ventricle regional wall motion abnormalities.  She does have grade 1 diastolic dysfunction.  Right ventricular systolic function [name] normal.  She reports feeling well with baseline fatigue.  States she takes morning chemotherapy pills and naps shortly after that.  She denies chest pain, chest pressure, or shortness of breath. She denies headaches or visual disturbances. She denies abdominal pain, nausea, vomiting, or changes in bowel or bladder habits.   She denies fevers or chills. She denies pain. Her appetite [name] improved.  She has made a conscious effort to improve calorie and protein intake.  No consuming 2 Ensure/boost shakes every day.  Her weight has increased 3.5 pounds over last month.  I have reviewed the past medical history, past surgical history, social history and family history with the patient and they are unchanged from previous note.  ALLERGIES:  [name] allergic to morphine and codeine.  MEDICATIONS:  Current Outpatient Medications  Medication Sig  Dispense Refill   acetaminophen  (TYLENOL ) 500 MG tablet Take 2 tablets (1,000 mg total) by mouth every 8 (eight) hours. 30 tablet 0   capecitabine  (XELODA ) 500 MG tablet TAKE 2 TABLETS TWICE DAILY FOR 7 DAYS ON, THEN 7 DAYS OFF (TAKE AFTER MEALS) 56 tablet 2   citalopram  (CELEXA ) 40 MG tablet Take 1 tablet (40 mg total) by mouth daily. 90 tablet 0   diphenoxylate -atropine  (LOMOTIL ) 2.5-0.025 MG tablet Take 1 tablet by mouth 4 (four) times daily as needed for diarrhea or loose stools. 90 tablet 3   levothyroxine  (SYNTHROID ) 75 MCG tablet Take 1 tablet (75 mcg total) by mouth daily before breakfast. 90 tablet 3   loperamide  (IMODIUM  A-D) 2 MG tablet Take 1 tablet (2 mg total) by mouth 4 (four) times daily as needed for diarrhea or loose stools. 90 tablet 0   Mouthwashes (MOUTH RINSE) LIQD solution 15 mLs by Mouth Rinse route as needed (for oral care).  0   ondansetron  (ZOFRAN ) 8 MG tablet Take 1 tablet (8 mg total) by mouth every 8 (eight) hours as needed for nausea or vomiting. 20 tablet 2   OVER THE COUNTER MEDICATION Take 1 tablet by mouth daily. Protandim Supplement     pregabalin  (LYRICA ) 25 MG capsule Take 1 capsule (25 mg total) by mouth 3 (three) times daily. 90 capsule 3   tucatinib  (TUKYSA ) 150 MG tablet Take 2 tablets (300 mg total) by mouth 2 (two) times daily. Take as instructed per MD. 120 tablet 2   No current facility-administered medications for this visit.   Facility-Administered Medications Ordered  in Other Visits  Medication Dose Route Frequency Provider Last Rate Last Admin   trastuzumab -anns (KANJINTI ) 210 mg in sodium chloride  0.9 % 250 mL chemo infusion  6 mg/kg (Treatment Plan Recorded) Intravenous Once [name], MD        HISTORY OF PRESENT ILLNESS:   Oncology History Overview Note  Cancer Staging Malignant neoplasm of lower-inner quadrant of left breast in female, estrogen receptor positive ([name]) Staging form: Breast, AJCC 8th Edition - Clinical stage from  01/16/2018: Stage IA (cT1c, cN0, cM0, G2, ER+, PR+, HER2-) - Signed by [name], MD on 01/23/2018 - Pathologic: Stage IA (pT1c, pN1, cM0, G1, ER+, PR+, HER2-) - Signed by [name], MD on 04/09/2018     Malignant neoplasm of lower-inner quadrant of left breast in female, estrogen receptor positive ([name])  01/15/2018 Mammogram   Diagnositc Mammogram 01/15/18  IMPRESSION: 1. Suspicious 1.2 x 1.4 x 1.3 cm mixed echogenicity mass left breast 7 o'clock position retroareolar location at the site of palpable concern.. 2. Indeterminate Within the left breast 7:30 o'clock retroareolar location, adjacent to the palpable mass, [name] a 0.5 x 0.4 x 0.5 cm oval circumscribed hypoechoic mass. 3. Indeterminate calcifications within the lateral left breast. Location of these calcifications [name] not definitely confirmed on the true lateral view.    01/16/2018 Initial Biopsy   Diagnosis 01/16/18 1. Breast, left, needle core biopsy, 7:30 o'clock (ribbon clip) - FIBROCYSTIC CHANGES WITH SCLEROSING ADENOSIS AND CALCIFICATIONS. - FIBROADENOMATOID CHANGE. - NO MALIGNANCY IDENTIFIED. 2. Breast, left, needle core biopsy, 7 o'clock position (coil clip) - INVASIVE MAMMARY CARCINOMA, MSBR GRADE I/II. - SEE MICROSCOPIC DESCRIPTION Microscopic Comment  ADDENDUM: Immunohistochemistry for E-Cadherin [name] strongly positive in the tumor consistent with ductal carcinoma. (JDP:ah 01/17/18)   01/16/2018 Receptors her2   Estrogen Receptor: 100%, POSITIVE, STRONG STAINING INTENSITY Progesterone Receptor: 50%, POSITIVE, STRONG STAINING INTENSITY Proliferation Marker Ki67: 20% HER2 Negative   01/16/2018 Cancer Staging   Staging form: Breast, AJCC 8th Edition - Clinical stage from 01/16/2018: Stage IA (cT1c, cN0, cM0, G2, ER+, PR+, HER2-) - Signed by [name], MD on 01/23/2018   01/22/2018 Initial Diagnosis   Malignant neoplasm of lower-inner quadrant of left breast in female, estrogen receptor positive ([name])   02/21/2018  Surgery    LEFT BREAST LUMPECTOMY WITH AXILLARY LYMPH NODE BIOPSY by Dr. Aron  02/21/18   02/21/2018 Pathology Results   Diagnosis 02/21/18 1. Breast, lumpectomy, Left - INVASIVE DUCTAL CARCINOMA, GRADE I, 1.6 CM. - DUCTAL CARCINOMA IN SITU, INTERMEDIATE NUCLEAR GRADE. - ANTERIOR AND MEDIAL RESECTION MARGINS ARE POSITIVE FOR CARCINOMA. - NEGATIVE FOR LYMPHOVASCULAR OR PERINEURAL INVASION. - BACKGROUND BREAST TISSUE WITH FIBROCYSTIC CHANGE, INCLUDING SCLEROSING ADENOSIS. - BIOPSY SITE CHANGES. - SEE ONCOLOGY TABLE. 2. Lymph node, sentinel, biopsy, Left Axillary #1 - METASTATIC BREAST CARCINOMA TO A LYMPH NODE, 1.0 CM IN GREATEST DIMENSION, WITH EXTRANODAL EXTENSION (1/1). 3. Lymph node, sentinel, biopsy, Left Axillary #2 - LYMPH NODE, NEGATIVE FOR CARCINOMA (0/1).    02/21/2018 Miscellaneous   Mammaprint 02/21/18 Low Risk with 10-year risk of recurrnce at 10% -No potential signifcant chemotherapy benefit   03/20/2018 Pathology Results   RE-EXCISION OF BREAST LUMPECTOMY by Dr. Aron  Diagnosis 03/20/18 1. Breast, excision, Left new anterior margin - FIBROCYSTIC CHANGES WITH ADENOSIS AND CALCIFICATIONS. - HEALING BIOPSY SITE. - THERE [name] NO EVIDENCE OF MALIGNANCY. 2. Breast, excision, Left new medial margin - FIBROCYSTIC CHANGES WITH ADENOSIS AND CALCIFICATIONS. - HEALING BIOPSY SITE. - THERE [name] NO EVIDENCE OF MALIGNANCY. Microscopic Comment 1. -2. The  surgical resection margin(s) of the specimen were inked and microscopically evaluated. (JBK:kh 03-22-18)   04/09/2018 Cancer Staging   Staging form: Breast, AJCC 8th Edition - Pathologic: Stage IA (pT1c, pN1, cM0, G1, ER+, PR+, HER2-) - Signed by [name], MD on 04/09/2018   04/22/2018 - 06/03/2018 Radiation Therapy   Radaiton with Dr. Izell 04/22/18-06/03/18   05/2018 -  Anti-estrogen oral therapy   Letrozole  2.5mg  started 05/2018    Survivorship   Per Mayme Silversmith, NP    05/04/2022 Imaging    IMPRESSION: Cervical  spondylosis, as described.   Nonspecific straightening of the expected cervical lordosis.   07/24/2022 Imaging    IMPRESSION: 1. Extensive osseous metastatic disease with pathologic fracture at the base of dens and C2 right lateral mass. Extraosseous tumor at C1 and C2 likely impinging on the right C2 and C3 nerve roots. 2. Degenerative cord impingement at C4-5 to C6-7. Biforaminal impingement at C5-6 and C6-7.   08/03/2022 PET scan    IMPRESSION: 1. Large volume osseous metastasis. 2. Low right cervical and probable right axillary nodal metastasis. 3. Subtle heterogeneous activity throughout the liver with suggestion of small liver lesions (likely new compared to chest CT of 07/16/2020). Findings are overall moderately suspicious for hepatic metastasis. Pre and post contrast abdominal MRI (preferred) or CT could confirm. 4. Right-sided pleural thickening and trace pleural fluid. Right base airspace disease [name] favored to represent chronic atelectasis. 5. Aortic atherosclerosis (ICD10-I70.0) and emphysema (ICD10-J43.9).     08/11/2022 Genetic Testing   Negative genetic testing on the CancerNext-Expanded+RNAinsight panel.  The report date [name] August 11, 2022.  The CancerNext-Expanded gene panel offered by [facility] and includes sequencing and rearrangement analysis for the following 77 genes: AIP, ALK, APC*, ATM*, AXIN2, BAP1, BARD1, BLM, BMPR1A, BRCA1*, BRCA2*, BRIP1*, CDC73, CDH1*, CDK4, CDKN1B, CDKN2A, CHEK2*, CTNNA1, DICER1, FANCC, FH, FLCN, GALNT12, KIF1B, LZTR1, MAX, MEN1, MET, MLH1*, MSH2*, MSH3, MSH6*, MUTYH*, NBN, NF1*, NF2, NTHL1, PALB2*, PHOX2B, PMS2*, POT1, PRKAR1A, PTCH1, PTEN*, RAD51C*, RAD51D*, RB1, RECQL, RET, SDHA, SDHAF2, SDHB, SDHC, SDHD, SMAD4, SMARCA4, SMARCB1, SMARCE1, STK11, SUFU, TMEM127, TP53*, TSC1, TSC2, VHL and XRCC2 (sequencing and deletion/duplication); EGFR, EGLN1, HOXB13, KIT, MITF, PDGFRA, POLD1, and POLE (sequencing only); EPCAM and GREM1  (deletion/duplication only). DNA and RNA analyses performed for * genes.    Lobular carcinoma in situ (LCIS) of right breast  01/20/2020 Mammogram   Diagnostic Mammogram 01/20/20 IMPRESSION: 1.  Stable post lumpectomy changes of the left breast.   2. Suspicious microcalcifications over the right upper outer quadrant spanning 3.6 cm.   02/02/2020 Initial Biopsy   Diagnosis 02/02/20 Breast, right, needle core biopsy, upper outer quadrant, x clip - LOBULAR CARCINOMA IN SITU WITH PLEOMORPHIC FEATURES AND CALCIFICATIONS, INVOLVING ADENOSIS. SEE NOTE Diagnosis Note Immunohistochemical stain for E-cadherin [name] negative in the lesional cells, consistent with a lobular phenotype. Immunostains for p63, SMM 1 and calponin do not show evidence of invasive carcinoma.    02/04/2020 Initial Diagnosis   Lobular carcinoma in situ (LCIS) of right breast   03/18/2020 Surgery   RIGHT BREAST LUMPECTOMY WITH RADIOACTIVE SEED LOCALIZATION by Dr Marla    03/18/2020 Pathology Results   FINAL MICROSCOPIC DIAGNOSIS:   A. BREAST, RIGHT, LUMPECTOMY:  - Pleomorphic lobular carcinoma in situ with calcifications and  underlying complex sclerosing lesion, adenosis and fibroadenomatoid  change.  - Margins of resection are not involved (Closest margins: < 1 mm,  anterior, posterior, inferior and medial).  - Biopsy site.    COMMENT:   P63, Calponin and  SMM-1 demonstrate the presence of myoepithelium in the  select focus.    Metastatic breast cancer to bone and brain  05/04/2022 Imaging    IMPRESSION: Cervical spondylosis, as described.   Nonspecific straightening of the expected cervical lordosis.   07/24/2022 Imaging    IMPRESSION: 1. Extensive osseous metastatic disease with pathologic fracture at the base of dens and C2 right lateral mass. Extraosseous tumor at C1 and C2 likely impinging on the right C2 and C3 nerve roots. 2. Degenerative cord impingement at C4-5 to C6-7. Biforaminal impingement at C5-6  and C6-7.   08/02/2022 Initial Diagnosis   Cancer, metastatic to bone ([name])   08/03/2022 PET scan    IMPRESSION: 1. Large volume osseous metastasis. 2. Low right cervical and probable right axillary nodal metastasis. 3. Subtle heterogeneous activity throughout the liver with suggestion of small liver lesions (likely new compared to chest CT of 07/16/2020). Findings are overall moderately suspicious for hepatic metastasis. Pre and post contrast abdominal MRI (preferred) or CT could confirm. 4. Right-sided pleural thickening and trace pleural fluid. Right base airspace disease [name] favored to represent chronic atelectasis. 5. Aortic atherosclerosis (ICD10-I70.0) and emphysema (ICD10-J43.9).     10/26/2022 - 04/12/2023 Chemotherapy   Patient [name] on Treatment Plan : BREAST Paclitaxel  D1,8,15 + Trastuzumab  D1 + Pertuzumab  D1 q21d x 8 cycles / Trastuzumab  D1 + Pertuzumab  D1 q21d x 4 cycles     05/03/2023 -  Chemotherapy   Patient [name] on Treatment Plan : BREAST MAINTENANCE Trastuzumab  IV (6) or SQ (600) D1 q21d X 11 Cycles         REVIEW OF SYSTEMS:   Constitutional: Denies fevers, chills or abnormal weight loss. Baseline fatigue Eyes: Denies blurriness of vision Ears, nose, mouth, throat, and face: Denies mucositis or sore throat Respiratory: Denies cough, dyspnea or wheezes Cardiovascular: Denies palpitation, chest discomfort or lower extremity swelling Gastrointestinal:  Denies nausea, heartburn or change in bowel habits Skin: Denies abnormal skin rashes Lymphatics: Denies new lymphadenopathy or easy bruising Neurological:Denies numbness, tingling or new weaknesses Behavioral/Psych: Mood [name] stable, no new changes  All other systems were reviewed with the patient and are negative.   VITALS:   Today's Vitals   05/15/24 0800 05/15/24 0851  BP:  104/68  Pulse:  78  Resp:  16  Temp:  98.4 F (36.9 C)  TempSrc:  Temporal  SpO2:  95%  Weight:  75 lb 8 oz (34.2 kg)  Height:  5' 2  (1.575 m)  PainSc: 0-No pain    Body mass index [name] 13.81 kg/m.   Wt Readings from Last 3 Encounters:  05/15/24 75 lb 8 oz (34.2 kg)  04/24/24 72 lb 8 oz (32.9 kg)  04/15/24 70 lb (31.8 kg)    Body mass index [name] 13.81 kg/m.  Performance status (ECOG): 1 - Symptomatic but completely ambulatory  PHYSICAL EXAM:   GENERAL:alert, no distress and comfortable. Thin.  SKIN: skin color, texture, turgor are normal, no rashes or significant lesions EYES: normal, Conjunctiva are pink and non-injected, sclera clear OROPHARYNX:no exudate, no erythema and lips, buccal mucosa, and tongue normal  NECK: supple, thyroid  normal size, non-tender, without nodularity LYMPH:  no palpable lymphadenopathy in the cervical, axillary or inguinal LUNGS: clear to auscultation and percussion with normal breathing effort HEART: regular rate & rhythm and no murmurs and no lower extremity edema ABDOMEN:abdomen soft, non-tender and normal bowel sounds Musculoskeletal:no cyanosis of digits and no clubbing  NEURO: alert & oriented x 3 with fluent speech, no focal  motor/sensory deficits  LABORATORY DATA:  I have reviewed the data as listed    Component Value Date/Time   NA 137 05/15/2024 0835   K 4.4 05/15/2024 0835   CL 101 05/15/2024 0835   CO2 32 05/15/2024 0835   GLUCOSE 99 05/15/2024 0835   BUN 19 05/15/2024 0835   CREATININE 0.54 05/15/2024 0835   CALCIUM 9.8 05/15/2024 0835   PROT 6.9 05/15/2024 0835   ALBUMIN 4.6 05/15/2024 0835   AST 18 05/15/2024 0835   ALT 21 05/15/2024 0835   ALKPHOS 82 05/15/2024 0835   BILITOT 0.6 05/15/2024 0835   GFRNONAA >60 05/15/2024 0835   GFRAA >60 05/21/2020 1350     Lab Results  Component Value Date   WBC 7.0 05/15/2024   NEUTROABS 5.4 05/15/2024   HGB 14.3 05/15/2024   HCT 40.2 05/15/2024   MCV 104.4 (H) 05/15/2024   PLT 176 05/15/2024    RADIOGRAPHIC STUDIES: ECHOCARDIOGRAM COMPLETE Result Date: 04/25/2024    ECHOCARDIOGRAM REPORT   Patient Name:    Martha [name] Date of Exam: 04/25/2024 Medical Rec #:  [id number]             Height:       62.0 in Accession #:    [id number]            Weight:       72.5 lb Date of Birth:  07/24/1961             BSA:          1.242 m Patient Age:    63 years              BP:           113/79 mmHg Patient Gender: F                     HR:           98 bpm. Exam Location:  Outpatient Procedure: 2D Echo, Cardiac Doppler and Color Doppler (Both Spectral and Color            Flow Doppler were utilized during procedure). Indications:    Z51.11 Encounter for antineoplastic chemotheraphy; R06.9 DOE  History:        Patient has prior history of Echocardiogram examinations, most                 recent 12/19/2023. Signs/Symptoms:Dyspnea; Risk Factors:Current                 Smoker. Patient denies chest pain and leg edema. She does have                 DOE. History of metastatic breast cancer to bone and brain.  Sonographer:    Annabella Cater RVT, RDCS (AE), RDMS Referring Phys: [phone] [facility]  Sonographer Comments: Technically challenging study due to limited acoustic windows. IMPRESSIONS  1. Left ventricular ejection fraction, by estimation, [name] 60 to 65%. The left ventricle has normal function. The left ventricle has no regional wall motion abnormalities. Left ventricular diastolic parameters are consistent with Grade I diastolic dysfunction (impaired relaxation).  2. Right ventricular systolic function [name] normal. The right ventricular size [name] normal. There [name] normal pulmonary artery systolic pressure.  3. The mitral valve [name] normal in structure. No evidence of mitral valve regurgitation. No evidence of mitral stenosis.  4. The aortic valve [name] tricuspid. Aortic valve regurgitation [name] not visualized. No aortic stenosis [name] present.  5. The  inferior vena cava [name] normal in size with greater than 50% respiratory variability, suggesting right atrial pressure of 3 mmHg. Comparison(s): 60%, GLS -20%. FINDINGS  Left Ventricle: Left  ventricular ejection fraction, by estimation, [name] 60 to 65%. The left ventricle has normal function. The left ventricle has no regional wall motion abnormalities. The left ventricular internal cavity size was normal in size. There [name]  no left ventricular hypertrophy. Left ventricular diastolic parameters are consistent with Grade I diastolic dysfunction (impaired relaxation). Normal left ventricular filling pressure. Right Ventricle: The right ventricular size [name] normal. No increase in right ventricular wall thickness. Right ventricular systolic function [name] normal. There [name] normal pulmonary artery systolic pressure. The tricuspid regurgitant velocity [name] 2.29 m/s, and  with an assumed right atrial pressure of 3 mmHg, the estimated right ventricular systolic pressure [name] 24.0 mmHg. Left Atrium: Left atrial size was normal in size. Right Atrium: Right atrial size was normal in size. Pericardium: There [name] no evidence of pericardial effusion. Mitral Valve: The mitral valve [name] normal in structure. No evidence of mitral valve regurgitation. No evidence of mitral valve stenosis. Tricuspid Valve: The tricuspid valve [name] normal in structure. Tricuspid valve regurgitation [name] trivial. No evidence of tricuspid stenosis. Aortic Valve: The aortic valve [name] tricuspid. Aortic valve regurgitation [name] not visualized. No aortic stenosis [name] present. Aortic valve mean gradient measures 1.0 mmHg. Aortic valve peak gradient measures 3.0 mmHg. Aortic valve area, by VTI measures 2.23 cm. Pulmonic Valve: The pulmonic valve was normal in structure. Pulmonic valve regurgitation [name] trivial. No evidence of pulmonic stenosis. Aorta: The aortic root [name] normal in size and structure. Venous: The inferior vena cava [name] normal in size with greater than 50% respiratory variability, suggesting right atrial pressure of 3 mmHg. IAS/Shunts: No atrial level shunt detected by color flow Doppler.  LEFT VENTRICLE PLAX 2D LVIDd:         3.42 cm   Diastology LVIDs:          2.30 cm   LV e' medial:    7.77 cm/s LV PW:         0.87 cm   LV E/e' medial:  7.1 LV IVS:        0.87 cm   LV e' lateral:   10.20 cm/s LVOT diam:     1.90 cm   LV E/e' lateral: 5.4 LV SV:         38 LV SV Index:   30 LVOT Area:     2.84 cm  LEFT ATRIUM             Index        RIGHT ATRIUM           Index LA diam:        2.60 cm 2.09 cm/m   RA Area:     12.10 cm LA Vol (A2C):   23.5 ml 18.92 ml/m  RA Volume:   34.40 ml  27.70 ml/m LA Vol (A4C):   27.6 ml 22.22 ml/m LA Biplane Vol: 26.2 ml 21.09 ml/m  AORTIC VALVE                    PULMONIC VALVE AV Area (Vmax):    2.28 cm     PV Vmax:       0.81 m/s AV Area (Vmean):   2.24 cm     PV Peak grad:  2.6 mmHg AV Area (VTI):     2.23 cm AV Vmax:  87.20 cm/s AV Vmean:          55.300 cm/s AV VTI:            0.169 m AV Peak Grad:      3.0 mmHg AV Mean Grad:      1.0 mmHg LVOT Vmax:         70.00 cm/s LVOT Vmean:        43.600 cm/s LVOT VTI:          0.133 m LVOT/AV VTI ratio: 0.79  AORTA Ao Root diam: 3.40 cm Ao Arch diam: 2.8 cm MITRAL VALVE               TRICUSPID VALVE MV Area (PHT): 3.56 cm    TR Peak grad:   21.0 mmHg MV Decel Time: 213 msec    TR Vmax:        229.00 cm/s MV E velocity: 55.30 cm/s MV A velocity: 65.50 cm/s  SHUNTS MV E/A ratio:  0.84        Systemic VTI:  0.13 m                            Systemic Diam: 1.90 cm Annabella Scarce MD Electronically signed by Annabella Scarce MD Signature Date/Time: 04/25/2024/3:54:23 PM    Final

## 2024-05-15 ENCOUNTER — Inpatient Hospital Stay: Attending: Genetic Counselor

## 2024-05-15 ENCOUNTER — Inpatient Hospital Stay (HOSPITAL_BASED_OUTPATIENT_CLINIC_OR_DEPARTMENT_OTHER): Admitting: Nurse Practitioner

## 2024-05-15 ENCOUNTER — Encounter: Payer: Self-pay | Admitting: Nurse Practitioner

## 2024-05-15 ENCOUNTER — Inpatient Hospital Stay

## 2024-05-15 ENCOUNTER — Ambulatory Visit: Admitting: Nurse Practitioner

## 2024-05-15 ENCOUNTER — Encounter: Payer: Self-pay | Admitting: Hematology

## 2024-05-15 VITALS — BP 104/68 | HR 78 | Temp 98.4°F | Resp 16 | Ht 62.0 in | Wt 75.5 lb

## 2024-05-15 DIAGNOSIS — Z7983 Long term (current) use of bisphosphonates: Secondary | ICD-10-CM | POA: Diagnosis not present

## 2024-05-15 DIAGNOSIS — Z515 Encounter for palliative care: Secondary | ICD-10-CM

## 2024-05-15 DIAGNOSIS — Z171 Estrogen receptor negative status [ER-]: Secondary | ICD-10-CM | POA: Diagnosis not present

## 2024-05-15 DIAGNOSIS — Z5112 Encounter for antineoplastic immunotherapy: Secondary | ICD-10-CM | POA: Diagnosis not present

## 2024-05-15 DIAGNOSIS — E46 Unspecified protein-calorie malnutrition: Secondary | ICD-10-CM | POA: Diagnosis not present

## 2024-05-15 DIAGNOSIS — C50312 Malignant neoplasm of lower-inner quadrant of left female breast: Secondary | ICD-10-CM | POA: Insufficient documentation

## 2024-05-15 DIAGNOSIS — C7951 Secondary malignant neoplasm of bone: Secondary | ICD-10-CM

## 2024-05-15 DIAGNOSIS — C7931 Secondary malignant neoplasm of brain: Secondary | ICD-10-CM | POA: Diagnosis not present

## 2024-05-15 DIAGNOSIS — Z923 Personal history of irradiation: Secondary | ICD-10-CM | POA: Diagnosis not present

## 2024-05-15 DIAGNOSIS — R530 Neoplastic (malignant) related fatigue: Secondary | ICD-10-CM

## 2024-05-15 DIAGNOSIS — M792 Neuralgia and neuritis, unspecified: Secondary | ICD-10-CM | POA: Diagnosis not present

## 2024-05-15 DIAGNOSIS — Z79811 Long term (current) use of aromatase inhibitors: Secondary | ICD-10-CM | POA: Insufficient documentation

## 2024-05-15 DIAGNOSIS — Z79899 Other long term (current) drug therapy: Secondary | ICD-10-CM | POA: Diagnosis not present

## 2024-05-15 LAB — CMP (CANCER CENTER ONLY)
ALT: 21 U/L (ref 0–44)
AST: 18 U/L (ref 15–41)
Albumin: 4.6 g/dL (ref 3.5–5.0)
Alkaline Phosphatase: 82 U/L (ref 38–126)
Anion gap: 4 — ABNORMAL LOW (ref 5–15)
BUN: 19 mg/dL (ref 8–23)
CO2: 32 mmol/L (ref 22–32)
Calcium: 9.8 mg/dL (ref 8.9–10.3)
Chloride: 101 mmol/L (ref 98–111)
Creatinine: 0.54 mg/dL (ref 0.44–1.00)
GFR, Estimated: >60 mL/min (ref >60)
Glucose, Bld: 99 mg/dL (ref 70–99)
Potassium: 4.4 mmol/L (ref 3.5–5.1)
Sodium: 137 mmol/L (ref 135–145)
Total Bilirubin: 0.6 mg/dL (ref 0.0–1.2)
Total Protein: 6.9 g/dL (ref 6.5–8.1)

## 2024-05-15 LAB — CBC WITH DIFFERENTIAL (CANCER CENTER ONLY)
Abs Immature Granulocytes: 0.11 K/uL — ABNORMAL HIGH (ref 0.00–0.07)
Basophils Absolute: 0 K/uL (ref 0.0–0.1)
Basophils Relative: 0 %
Eosinophils Absolute: 0 K/uL (ref 0.0–0.5)
Eosinophils Relative: 1 %
HCT: 40.2 % (ref 36.0–46.0)
Hemoglobin: 14.3 g/dL (ref 12.0–15.0)
Immature Granulocytes: 2 %
Lymphocytes Relative: 12 %
Lymphs Abs: 0.8 K/uL (ref 0.7–4.0)
MCH: 37.1 pg — ABNORMAL HIGH (ref 26.0–34.0)
MCHC: 35.6 g/dL (ref 30.0–36.0)
MCV: 104.4 fL — ABNORMAL HIGH (ref 80.0–100.0)
Monocytes Absolute: 0.6 K/uL (ref 0.1–1.0)
Monocytes Relative: 9 %
Neutro Abs: 5.4 K/uL (ref 1.7–7.7)
Neutrophils Relative %: 76 %
Platelet Count: 176 K/uL (ref 150–400)
RBC: 3.85 MIL/uL — ABNORMAL LOW (ref 3.87–5.11)
RDW: 17.3 % — ABNORMAL HIGH (ref 11.5–15.5)
WBC Count: 7 K/uL (ref 4.0–10.5)
nRBC: 0 % (ref 0.0–0.2)

## 2024-05-15 LAB — T4, FREE: Free T4: 1.15 ng/dL — ABNORMAL HIGH (ref 0.61–1.12)

## 2024-05-15 LAB — TSH: TSH: 2.05 u[IU]/mL (ref 0.350–4.500)

## 2024-05-15 MED ORDER — ACETAMINOPHEN 325 MG PO TABS
650.0000 mg | ORAL_TABLET | Freq: Once | ORAL | Status: AC
  Administered 2024-05-15: 650 mg via ORAL
  Filled 2024-05-15: qty 2

## 2024-05-15 MED ORDER — TRASTUZUMAB-ANNS CHEMO 150 MG IV SOLR
6.0000 mg/kg | Freq: Once | INTRAVENOUS | Status: AC
  Administered 2024-05-15: 210 mg via INTRAVENOUS
  Filled 2024-05-15: qty 10

## 2024-05-15 MED ORDER — SODIUM CHLORIDE 0.9 % IV SOLN: Freq: Once | INTRAVENOUS | Status: AC

## 2024-05-15 NOTE — Progress Notes (Signed)
 Palliative Medicine [facility]  [phone] Fax:([phone]) [phone]   Name: Martha [name] Date: 05/15/2024 MRN: [id number]  DOB: [birth year]  Patient Care Team: [name], MD as PCP - General (Family Medicine) [name], MD as Consulting Physician (General Surgery) [name], MD as Consulting Physician (Hematology) [name], MD as Attending Physician (Radiation Oncology) [name], [name], NP as Nurse Practitioner (Nurse Practitioner)   INTERVAL HISTORY: Martha [name] is a 63 y.o. female with oncologic medical history including ER+ breast cancer (01/2018), right breast cancer (04/2021), now with metastatic disease progression involving brain and liver, pathological fractures with osseous mets s/p brain radiation. .  Palliative ask to see for symptom management and goals of care.   SOCIAL HISTORY:     reports that she has been smoking cigarettes. She has a 20 pack-year smoking history. She has never used smokeless tobacco. She reports that she does not currently use alcohol after a past usage of about 28.0 standard drinks of alcohol per week. She reports that she does not use drugs.  ADVANCE DIRECTIVES:  On file   CODE STATUS: DNR  PAST MEDICAL HISTORY: Past Medical History:  Diagnosis Date   Anxiety    Cancer ([name]) 2022   right breast LCIS   Cancer ([name]) 2019   left breast   Depression    Dysrhythmia    SVT, s/p ablation ~ 2012 in at [facility]   Ectopic pregnancy    Family history of breast cancer    Family history of melanoma    History of radiation therapy 04/22/18-06/03/18   Left Breast, left SCV, axilla 50 Gy in 25 fractions, Left breast boost 10 Gy in 5 fractions.    Hypothyroidism    Personal history of radiation therapy    PONV (postoperative nausea and vomiting)    Thyroid  disease     ALLERGIES:  is allergic to morphine and codeine.  MEDICATIONS:  Current Outpatient Medications  Medication  Sig Dispense Refill   acetaminophen  (TYLENOL ) 500 MG tablet Take 2 tablets (1,000 mg total) by mouth every 8 (eight) hours. 30 tablet 0   capecitabine  (XELODA ) 500 MG tablet TAKE 2 TABLETS TWICE DAILY FOR 7 DAYS ON, THEN 7 DAYS OFF (TAKE AFTER MEALS) 56 tablet 2   citalopram  (CELEXA ) 40 MG tablet Take 1 tablet (40 mg total) by mouth daily. 90 tablet 0   diphenoxylate -atropine  (LOMOTIL ) 2.5-0.025 MG tablet Take 1 tablet by mouth 4 (four) times daily as needed for diarrhea or loose stools. 90 tablet 3   levothyroxine  (SYNTHROID ) 75 MCG tablet Take 1 tablet (75 mcg total) by mouth daily before breakfast. 90 tablet 3   loperamide  (IMODIUM  A-D) 2 MG tablet Take 1 tablet (2 mg total) by mouth 4 (four) times daily as needed for diarrhea or loose stools. 90 tablet 0   Mouthwashes (MOUTH RINSE) LIQD solution 15 mLs by Mouth Rinse route as needed (for oral care).  0   ondansetron  (ZOFRAN ) 8 MG tablet Take 1 tablet (8 mg total) by mouth every 8 (eight) hours as needed for nausea or vomiting. 20 tablet 2   OVER THE COUNTER MEDICATION Take 1 tablet by mouth daily. Protandim Supplement     pregabalin  (LYRICA ) 25 MG capsule Take 1 capsule (25 mg total) by mouth 3 (three) times daily. 90 capsule 3   tucatinib  (TUKYSA ) 150 MG tablet Take 2 tablets (300 mg total) by mouth 2 (two) times daily. Take as instructed per MD.  120 tablet 2   No current facility-administered medications for this visit.    VITAL SIGNS: There were no vitals taken for this visit. There were no vitals filed for this visit.   Estimated body mass index is 13.81 kg/m as calculated from the following:   Height as of an earlier encounter on 05/15/24: 5' 2 (1.575 m).   Weight as of an earlier encounter on 05/15/24: 75 lb 8 oz (34.2 kg).   PERFORMANCE STATUS (ECOG) : 1 - Symptomatic but completely ambulatory   Physical Exam General: NAD Cardiovascular: regular rate and rhythm Pulmonary: normal breathing pattern Extremities: no edema, no  joint deformities Skin: no rashes Neurological: AAO x3  IMPRESSION: Discussed the use of AI scribe software for clinical note transcription with the patient, who gave verbal consent to proceed.  History of Present Illness Martha [name] is a 63 year old female with metastatic cancer who seen during infusion. She is doing well overall. Denies concerns for nausea, vomiting, constipation, or diarrhea. Patient is much appreciative of her ability to be as active as she can. States she is able to drive short distances and run errands that she desires.   Pain and neuropathic symptoms are well controlled on Pregabalin . No adjustments to regimen at this time.   All symptoms well managed. All questions answered and support provided.   I discussed the importance of continued conversation with family and their medical providers regarding overall plan of care and treatment options, ensuring decisions are within the context of the patients values and GOCs. Assessment & Plan  Neuropathy Pregabalin  dosing tolerated. Discomfort much improved. - Continue pregabalin  dosage to one capsule (25mg ) three times a day.  Quality of Life QOL much improved. Decreased symptom burden.   I will see patient back in 3-4 weeks. Sooner if needed.   Patient expressed understanding and was in agreement with this plan. She also understands that She can call the clinic at any time with any questions, concerns, or complaints.   Any controlled substances utilized were prescribed in the context of palliative care. PDMP has been reviewed.   Visit consisted of counseling and education dealing with the complex and emotionally intense issues of symptom management and palliative care in the setting of serious and potentially life-threatening illness.  Levon Borer, AGPCNP-BC  Palliative Medicine Team/[facility]

## 2024-06-05 ENCOUNTER — Inpatient Hospital Stay

## 2024-06-05 ENCOUNTER — Inpatient Hospital Stay (HOSPITAL_BASED_OUTPATIENT_CLINIC_OR_DEPARTMENT_OTHER): Admitting: Hematology

## 2024-06-05 ENCOUNTER — Inpatient Hospital Stay: Admitting: Nurse Practitioner

## 2024-06-05 ENCOUNTER — Encounter: Payer: Self-pay | Admitting: Nurse Practitioner

## 2024-06-05 ENCOUNTER — Inpatient Hospital Stay: Attending: Genetic Counselor

## 2024-06-05 VITALS — BP 110/64 | HR 63 | Temp 98.3°F | Resp 16 | Ht 62.0 in | Wt 75.6 lb

## 2024-06-05 DIAGNOSIS — M792 Neuralgia and neuritis, unspecified: Secondary | ICD-10-CM

## 2024-06-05 DIAGNOSIS — Z79899 Other long term (current) drug therapy: Secondary | ICD-10-CM | POA: Insufficient documentation

## 2024-06-05 DIAGNOSIS — Z171 Estrogen receptor negative status [ER-]: Secondary | ICD-10-CM | POA: Diagnosis not present

## 2024-06-05 DIAGNOSIS — Z170 Estrogen receptor positive status [ER+]: Secondary | ICD-10-CM

## 2024-06-05 DIAGNOSIS — C7951 Secondary malignant neoplasm of bone: Secondary | ICD-10-CM

## 2024-06-05 DIAGNOSIS — Z95828 Presence of other vascular implants and grafts: Secondary | ICD-10-CM

## 2024-06-05 DIAGNOSIS — R530 Neoplastic (malignant) related fatigue: Secondary | ICD-10-CM

## 2024-06-05 DIAGNOSIS — Z5112 Encounter for antineoplastic immunotherapy: Secondary | ICD-10-CM | POA: Diagnosis not present

## 2024-06-05 DIAGNOSIS — Z923 Personal history of irradiation: Secondary | ICD-10-CM | POA: Diagnosis not present

## 2024-06-05 DIAGNOSIS — C50312 Malignant neoplasm of lower-inner quadrant of left female breast: Secondary | ICD-10-CM | POA: Insufficient documentation

## 2024-06-05 DIAGNOSIS — C799 Secondary malignant neoplasm of unspecified site: Secondary | ICD-10-CM | POA: Diagnosis not present

## 2024-06-05 DIAGNOSIS — F32A Depression, unspecified: Secondary | ICD-10-CM

## 2024-06-05 DIAGNOSIS — Z515 Encounter for palliative care: Secondary | ICD-10-CM

## 2024-06-05 DIAGNOSIS — C787 Secondary malignant neoplasm of liver and intrahepatic bile duct: Secondary | ICD-10-CM | POA: Diagnosis not present

## 2024-06-05 DIAGNOSIS — C7931 Secondary malignant neoplasm of brain: Secondary | ICD-10-CM | POA: Diagnosis not present

## 2024-06-05 LAB — CBC WITH DIFFERENTIAL (CANCER CENTER ONLY)
Abs Immature Granulocytes: 0.05 K/uL (ref 0.00–0.07)
Basophils Absolute: 0 K/uL (ref 0.0–0.1)
Basophils Relative: 0 %
Eosinophils Absolute: 0 K/uL (ref 0.0–0.5)
Eosinophils Relative: 0 %
HCT: 40.7 % (ref 36.0–46.0)
Hemoglobin: 14.7 g/dL (ref 12.0–15.0)
Immature Granulocytes: 1 %
Lymphocytes Relative: 13 %
Lymphs Abs: 0.9 K/uL (ref 0.7–4.0)
MCH: 38.4 pg — ABNORMAL HIGH (ref 26.0–34.0)
MCHC: 36.1 g/dL — ABNORMAL HIGH (ref 30.0–36.0)
MCV: 106.3 fL — ABNORMAL HIGH (ref 80.0–100.0)
Monocytes Absolute: 0.6 K/uL (ref 0.1–1.0)
Monocytes Relative: 9 %
Neutro Abs: 5.2 K/uL (ref 1.7–7.7)
Neutrophils Relative %: 77 %
Platelet Count: 153 K/uL (ref 150–400)
RBC: 3.83 MIL/uL — ABNORMAL LOW (ref 3.87–5.11)
RDW: 15.9 % — ABNORMAL HIGH (ref 11.5–15.5)
WBC Count: 6.8 K/uL (ref 4.0–10.5)
nRBC: 0 % (ref 0.0–0.2)

## 2024-06-05 LAB — CMP (CANCER CENTER ONLY)
ALT: 18 U/L (ref 0–44)
AST: 21 U/L (ref 15–41)
Albumin: 4.6 g/dL (ref 3.5–5.0)
Alkaline Phosphatase: 77 U/L (ref 38–126)
Anion gap: 5 (ref 5–15)
BUN: 16 mg/dL (ref 8–23)
CO2: 31 mmol/L (ref 22–32)
Calcium: 10.1 mg/dL (ref 8.9–10.3)
Chloride: 100 mmol/L (ref 98–111)
Creatinine: 0.54 mg/dL (ref 0.44–1.00)
GFR, Estimated: >60 mL/min (ref >60)
Glucose, Bld: 103 mg/dL — ABNORMAL HIGH (ref 70–99)
Potassium: 4.5 mmol/L (ref 3.5–5.1)
Sodium: 136 mmol/L (ref 135–145)
Total Bilirubin: 0.9 mg/dL (ref 0.0–1.2)
Total Protein: 7 g/dL (ref 6.5–8.1)

## 2024-06-05 MED ORDER — CAPECITABINE 500 MG PO TABS: ORAL_TABLET | ORAL | 2 refills | Status: DC

## 2024-06-05 MED ORDER — ZOLEDRONIC ACID 4 MG/100ML IV SOLN
4.0000 mg | Freq: Once | INTRAVENOUS | Status: AC
  Administered 2024-06-05: 4 mg via INTRAVENOUS
  Filled 2024-06-05: qty 100

## 2024-06-05 MED ORDER — ACETAMINOPHEN 325 MG PO TABS
650.0000 mg | ORAL_TABLET | Freq: Once | ORAL | Status: AC
  Administered 2024-06-05: 650 mg via ORAL
  Filled 2024-06-05: qty 2

## 2024-06-05 MED ORDER — TUCATINIB 150 MG PO TABS: 300.0000 mg | ORAL_TABLET | Freq: Two times a day (BID) | ORAL | 2 refills | Status: AC

## 2024-06-05 MED ORDER — TRASTUZUMAB-ANNS CHEMO 150 MG IV SOLR
6.0000 mg/kg | Freq: Once | INTRAVENOUS | Status: AC
  Administered 2024-06-05: 210 mg via INTRAVENOUS
  Filled 2024-06-05: qty 10

## 2024-06-05 MED ORDER — SODIUM CHLORIDE 0.9 % IV SOLN: Freq: Once | INTRAVENOUS | Status: AC

## 2024-06-05 MED ORDER — SODIUM CHLORIDE 0.9% FLUSH: 10.0000 mL | INTRAVENOUS | Status: DC | PRN

## 2024-06-05 MED ORDER — CITALOPRAM HYDROBROMIDE 40 MG PO TABS: 40.0000 mg | ORAL_TABLET | Freq: Every day | ORAL | 0 refills | Status: DC

## 2024-06-05 NOTE — Progress Notes (Signed)
 Palliative Medicine [facility]  Telephone:(336) [phone] Fax:([phone]) [phone]   Name: Martha [name] Date: 06/05/2024 MRN: [id number]  DOB: 12/06/1960  Patient Care Team: [name], MD as PCP - General (Family Medicine) [name], MD as Consulting Physician (General Surgery) [name], MD as Consulting Physician (Hematology) [name], MD as Attending Physician (Radiation Oncology) [name], [name], NP as Nurse Practitioner (Nurse Practitioner)   INTERVAL HISTORY: Martha [name] is a 63 y.o. female with oncologic medical history including ER+ breast cancer (01/2018), right breast cancer (04/2021), now with metastatic disease progression involving brain and liver, pathological fractures with osseous mets s/p brain radiation. .  Palliative ask to see for symptom management and goals of care.   SOCIAL HISTORY:     reports that she has been smoking cigarettes. She has a 20 pack-year smoking history. She has never used smokeless tobacco. She reports that she does not currently use alcohol after a past usage of about 28.0 standard drinks of alcohol per week. She reports that she does not use drugs.  ADVANCE DIRECTIVES:  On file   CODE STATUS: DNR  PAST MEDICAL HISTORY: Past Medical History:  Diagnosis Date   Anxiety    Cancer ([name]) 2022   right breast LCIS   Cancer ([name]) 2019   left breast   Depression    Dysrhythmia    SVT, s/p ablation ~ 2012 in at [facility]   Ectopic pregnancy    Family history of breast cancer    Family history of melanoma    History of radiation therapy 04/22/18-06/03/18   Left Breast, left SCV, axilla 50 Gy in 25 fractions, Left breast boost 10 Gy in 5 fractions.    Hypothyroidism    Personal history of radiation therapy    PONV (postoperative nausea and vomiting)    Thyroid  disease     ALLERGIES:  is allergic to morphine and codeine.  MEDICATIONS:  Current Outpatient Medications  Medication  Sig Dispense Refill   acetaminophen  (TYLENOL ) 500 MG tablet Take 2 tablets (1,000 mg total) by mouth every 8 (eight) hours. 30 tablet 0   capecitabine  (XELODA ) 500 MG tablet TAKE 2 TABLETS TWICE DAILY FOR 7 DAYS ON, THEN 7 DAYS OFF (TAKE AFTER MEALS) 56 tablet 2   citalopram  (CELEXA ) 40 MG tablet Take 1 tablet (40 mg total) by mouth daily. 90 tablet 0   diphenoxylate -atropine  (LOMOTIL ) 2.5-0.025 MG tablet Take 1 tablet by mouth 4 (four) times daily as needed for diarrhea or loose stools. 90 tablet 3   levothyroxine  (SYNTHROID ) 75 MCG tablet Take 1 tablet (75 mcg total) by mouth daily before breakfast. 90 tablet 3   loperamide  (IMODIUM  A-D) 2 MG tablet Take 1 tablet (2 mg total) by mouth 4 (four) times daily as needed for diarrhea or loose stools. 90 tablet 0   Mouthwashes (MOUTH RINSE) LIQD solution 15 mLs by Mouth Rinse route as needed (for oral care).  0   ondansetron  (ZOFRAN ) 8 MG tablet Take 1 tablet (8 mg total) by mouth every 8 (eight) hours as needed for nausea or vomiting. 20 tablet 2   OVER THE COUNTER MEDICATION Take 1 tablet by mouth daily. Protandim Supplement     pregabalin  (LYRICA ) 25 MG capsule Take 1 capsule (25 mg total) by mouth 3 (three) times daily. 90 capsule 3   tucatinib  (TUKYSA ) 150 MG tablet Take 2 tablets (300 mg total) by mouth 2 (two) times daily. Take as instructed per MD.  120 tablet 2   No current facility-administered medications for this visit.   Facility-Administered Medications Ordered in Other Visits  Medication Dose Route Frequency Provider Last Rate Last Admin   sodium chloride  flush (NS) 0.9 % injection 10 mL  10 mL Intracatheter PRN [name], MD        VITAL SIGNS: There were no vitals taken for this visit. There were no vitals filed for this visit.   Estimated body mass index is 13.83 kg/m as calculated from the following:   Height as of an earlier encounter on 06/05/24: 5' 2 (1.575 m).   Weight as of an earlier encounter on 06/05/24: 75 lb 9.6 oz  (34.3 kg).   PERFORMANCE STATUS (ECOG) : 1 - Symptomatic but completely ambulatory   Physical Exam General: NAD Cardiovascular: regular rate and rhythm Pulmonary: normal breathing pattern Extremities: no edema, no joint deformities Skin: no rashes Neurological: AAO x3  IMPRESSION: Discussed the use of AI scribe software for clinical note transcription with the patient, who gave verbal consent to proceed.  History of Present Illness Martha [name] is a 63 year old female with metastatic cancer who seen during infusion. She is doing well overall. Denies concerns for nausea, vomiting, constipation, or diarrhea. Patient is much appreciative of her ability to be as active as she can. States she is able to drive short distances and run errands that she desires.   Pain and neuropathic symptoms are well controlled on Pregabalin . No adjustments to regimen at this time.   All symptoms well managed. All questions answered and support provided.   I discussed the importance of continued conversation with family and their medical providers regarding overall plan of care and treatment options, ensuring decisions are within the context of the patients values and GOCs. Assessment & Plan  Neuropathy Pregabalin  dosing tolerated. Discomfort much improved. - Continue pregabalin  dosage to one capsule (25mg ) three times a day.  Quality of Life QOL much improved. Decreased symptom burden.   I will see patient back in 3-4 weeks. Sooner if needed.   Patient expressed understanding and was in agreement with this plan. She also understands that She can call the clinic at any time with any questions, concerns, or complaints.   Any controlled substances utilized were prescribed in the context of palliative care. PDMP has been reviewed.   Visit consisted of counseling and education dealing with the complex and emotionally intense issues of symptom management and palliative care in the setting  of serious and potentially life-threatening illness.  Levon Borer, AGPCNP-BC  Palliative Medicine Team/[facility]

## 2024-06-05 NOTE — Progress Notes (Signed)
 [facility]   [phone] [phone]   Clinic Follow up Note   Patient Care Team: [name], MD as PCP - General (Family Medicine) [name], MD as Consulting Physician (General Surgery) [name], MD as Consulting Physician (Hematology) [name], MD as Attending Physician (Radiation Oncology) [name], [name], NP as Nurse Practitioner (Nurse Practitioner)  Date of Service:  06/05/2024  CHIEF COMPLAINT: f/u of metastatic breast cancer  CURRENT THERAPY:  Trastuzumab  every 3 weeks, Xeloda  and Tucatinib   Oncology History   Metastatic breast cancer to bone and brain -Diagnosed in 06/2022, ER-/PR-/HER2+ --PET scan from 08/03/2022 showed diffuse bone mets  -she underwent cervical laminectomy and fusion by Dr. Debby on 08/17/2022, biopsy confirmed metastatic breast cancer, ER/PR negative, HER2 positive. -due to cervical cord compression with myelopathy, she underwent a second cervical spine surgery on September 06, 2022, and completed inpatient rehabitation. -She has completed brain, cervical and lumbar spine radiation in Feb 2024 -She was recently hospitalized for altered mental status and confusion, probably secondary to medication -She started first line systemic chemotherapy with weekly Taxol  and trastuzumab /Perjeta  on 10/25/22. She has tolerated well so far and her pain has improved overall -PET scan from Jan 01, 2023 showed complete metabolic response, no hypermetabolic uptake in diffuse bone lesions, no other new lesions.  She is clinically also doing much better.  Given her excellent response to chemo, we will change her Taxol  to 2 weeks on and 1 week off -She Unfortunately developed more more brain metastasis on recent brain MRI. -I personally reviewed her restaging PET scan from April 13, 2023, which showed stable disease, no new lesions. -Due to her new brain metastasis, I changed her therapy to Tucatinib , capecitabine  and trastuzumab ,  for better brain penetration. She started in late August 2024, tolerating well so far.  Her case was reviewed in CNS tumor board, Dr. Izell plan to hold on whole brain radiation since she is starting new Her2 antibody and monitor her brain mets closely.  -restaging MRI brian 08/30/2023 showed partial response, PET in 06/2023 showed no hypermetabolic lesions  -brain MRI 12/27/2023 showed progressive brain mets, she started whole brain RT on 5/19 and completed on 02/01/2024  -she is also on Zometa  every 3 months -continue Tucatinib , capecitabine  and trastuzumab  -PET 02/19/2024 showed no hypermetabolic disease.    Assessment & Plan Metastatic breast cancer with bone metastases Currently well-managed on Xeloda  and tucatinib . Weight gain likely due to nutritional supplementation with Ensure. No new symptoms reported. - Refill Xeloda  through [facility] pharmacy - Order PET scan in five weeks, to be done one week before the next appointment - Continue current regimen of Xeloda  and tucatinib  - Schedule brain MRI in November - Order echocardiogram in November or January  Chronic pain due to malignancy Pain is well-managed with Tylenol  and pregabalin . No use of stronger opioids like oxycodone . - Continue Tylenol  and pregabalin  for pain management  Cough Chronic cough present for months, managed with Mucinex . No shortness of breath except with long-distance exertion or stairs. - Continue Mucinex  as needed for cough   Plan - She is clinically doing well, and tolerating treatment well, will continue. - Labs reviewed, adequate for treatment, will proceed trastuzumab  today and continue every 3 weeks - I reviewed capecitabine  and Tucatinib , she will continue at home. - Follow-up in 6 weeks with PET scan a week before - Will order echocardiogram on next visit  SUMMARY OF ONCOLOGIC HISTORY: Oncology History Overview Note  Cancer Staging Malignant  neoplasm of lower-inner quadrant of left breast in female,  estrogen receptor positive ([name]) Staging form: Breast, AJCC 8th Edition - Clinical stage from 01/16/2018: Stage IA (cT1c, cN0, cM0, G2, ER+, PR+, HER2-) - Signed by [name], MD on 01/23/2018 - Pathologic: Stage IA (pT1c, pN1, cM0, G1, ER+, PR+, HER2-) - Signed by [name], MD on 04/09/2018     Malignant neoplasm of lower-inner quadrant of left breast in female, estrogen receptor positive ([name])  01/15/2018 Mammogram   Diagnositc Mammogram 01/15/18  IMPRESSION: 1. Suspicious 1.2 x 1.4 x 1.3 cm mixed echogenicity mass left breast 7 o'clock position retroareolar location at the site of palpable concern.. 2. Indeterminate Within the left breast 7:30 o'clock retroareolar location, adjacent to the palpable mass, is a 0.5 x 0.4 x 0.5 cm oval circumscribed hypoechoic mass. 3. Indeterminate calcifications within the lateral left breast. Location of these calcifications is not definitely confirmed on the true lateral view.    01/16/2018 Initial Biopsy   Diagnosis 01/16/18 1. Breast, left, needle core biopsy, 7:30 o'clock (ribbon clip) - FIBROCYSTIC CHANGES WITH SCLEROSING ADENOSIS AND CALCIFICATIONS. - FIBROADENOMATOID CHANGE. - NO MALIGNANCY IDENTIFIED. 2. Breast, left, needle core biopsy, 7 o'clock position (coil clip) - INVASIVE MAMMARY CARCINOMA, MSBR GRADE I/II. - SEE MICROSCOPIC DESCRIPTION Microscopic Comment  ADDENDUM: Immunohistochemistry for E-Cadherin is strongly positive in the tumor consistent with ductal carcinoma. (JDP:ah 01/17/18)   01/16/2018 Receptors her2   Estrogen Receptor: 100%, POSITIVE, STRONG STAINING INTENSITY Progesterone Receptor: 50%, POSITIVE, STRONG STAINING INTENSITY Proliferation Marker Ki67: 20% HER2 Negative   01/16/2018 Cancer Staging   Staging form: Breast, AJCC 8th Edition - Clinical stage from 01/16/2018: Stage IA (cT1c, cN0, cM0, G2, ER+, PR+, HER2-) - Signed by [name], MD on 01/23/2018   01/22/2018 Initial Diagnosis   Malignant neoplasm of  lower-inner quadrant of left breast in female, estrogen receptor positive ([name])   02/21/2018 Surgery    LEFT BREAST LUMPECTOMY WITH AXILLARY LYMPH NODE BIOPSY by Dr. Aron  02/21/18   02/21/2018 Pathology Results   Diagnosis 02/21/18 1. Breast, lumpectomy, Left - INVASIVE DUCTAL CARCINOMA, GRADE I, 1.6 CM. - DUCTAL CARCINOMA IN SITU, INTERMEDIATE NUCLEAR GRADE. - ANTERIOR AND MEDIAL RESECTION MARGINS ARE POSITIVE FOR CARCINOMA. - NEGATIVE FOR LYMPHOVASCULAR OR PERINEURAL INVASION. - BACKGROUND BREAST TISSUE WITH FIBROCYSTIC CHANGE, INCLUDING SCLEROSING ADENOSIS. - BIOPSY SITE CHANGES. - SEE ONCOLOGY TABLE. 2. Lymph node, sentinel, biopsy, Left Axillary #1 - METASTATIC BREAST CARCINOMA TO A LYMPH NODE, 1.0 CM IN GREATEST DIMENSION, WITH EXTRANODAL EXTENSION (1/1). 3. Lymph node, sentinel, biopsy, Left Axillary #2 - LYMPH NODE, NEGATIVE FOR CARCINOMA (0/1).    02/21/2018 Miscellaneous   Mammaprint 02/21/18 Low Risk with 10-year risk of recurrnce at 10% -No potential signifcant chemotherapy benefit   03/20/2018 Pathology Results   RE-EXCISION OF BREAST LUMPECTOMY by Dr. Aron  Diagnosis 03/20/18 1. Breast, excision, Left new anterior margin - FIBROCYSTIC CHANGES WITH ADENOSIS AND CALCIFICATIONS. - HEALING BIOPSY SITE. - THERE IS NO EVIDENCE OF MALIGNANCY. 2. Breast, excision, Left new medial margin - FIBROCYSTIC CHANGES WITH ADENOSIS AND CALCIFICATIONS. - HEALING BIOPSY SITE. - THERE IS NO EVIDENCE OF MALIGNANCY. Microscopic Comment 1. -2. The surgical resection margin(s) of the specimen were inked and microscopically evaluated. (JBK:kh 03-22-18)   04/09/2018 Cancer Staging   Staging form: Breast, AJCC 8th Edition - Pathologic: Stage IA (pT1c, pN1, cM0, G1, ER+, PR+, HER2-) - Signed by [name], MD on 04/09/2018   04/22/2018 - 06/03/2018 Radiation Therapy   Radaiton with Dr. Izell 04/22/18-06/03/18  05/2018 -  Anti-estrogen oral therapy   Letrozole  2.5mg  started 05/2018     Survivorship   Per Mayme Silversmith, NP    05/04/2022 Imaging    IMPRESSION: Cervical spondylosis, as described.   Nonspecific straightening of the expected cervical lordosis.   07/24/2022 Imaging    IMPRESSION: 1. Extensive osseous metastatic disease with pathologic fracture at the base of dens and C2 right lateral mass. Extraosseous tumor at C1 and C2 likely impinging on the right C2 and C3 nerve roots. 2. Degenerative cord impingement at C4-5 to C6-7. Biforaminal impingement at C5-6 and C6-7.   08/03/2022 PET scan    IMPRESSION: 1. Large volume osseous metastasis. 2. Low right cervical and probable right axillary nodal metastasis. 3. Subtle heterogeneous activity throughout the liver with suggestion of small liver lesions (likely new compared to chest CT of 07/16/2020). Findings are overall moderately suspicious for hepatic metastasis. Pre and post contrast abdominal MRI (preferred) or CT could confirm. 4. Right-sided pleural thickening and trace pleural fluid. Right base airspace disease is favored to represent chronic atelectasis. 5. Aortic atherosclerosis (ICD10-I70.0) and emphysema (ICD10-J43.9).     08/11/2022 Genetic Testing   Negative genetic testing on the CancerNext-Expanded+RNAinsight panel.  The report date is August 11, 2022.  The CancerNext-Expanded gene panel offered by [facility] and includes sequencing and rearrangement analysis for the following 77 genes: AIP, ALK, APC*, ATM*, AXIN2, BAP1, BARD1, BLM, BMPR1A, BRCA1*, BRCA2*, BRIP1*, CDC73, CDH1*, CDK4, CDKN1B, CDKN2A, CHEK2*, CTNNA1, DICER1, FANCC, FH, FLCN, GALNT12, KIF1B, LZTR1, MAX, MEN1, MET, MLH1*, MSH2*, MSH3, MSH6*, MUTYH*, NBN, NF1*, NF2, NTHL1, PALB2*, PHOX2B, PMS2*, POT1, PRKAR1A, PTCH1, PTEN*, RAD51C*, RAD51D*, RB1, RECQL, RET, SDHA, SDHAF2, SDHB, SDHC, SDHD, SMAD4, SMARCA4, SMARCB1, SMARCE1, STK11, SUFU, TMEM127, TP53*, TSC1, TSC2, VHL and XRCC2 (sequencing and deletion/duplication); EGFR, EGLN1,  HOXB13, KIT, MITF, PDGFRA, POLD1, and POLE (sequencing only); EPCAM and GREM1 (deletion/duplication only). DNA and RNA analyses performed for * genes.    Lobular carcinoma in situ (LCIS) of right breast  01/20/2020 Mammogram   Diagnostic Mammogram 01/20/20 IMPRESSION: 1.  Stable post lumpectomy changes of the left breast.   2. Suspicious microcalcifications over the right upper outer quadrant spanning 3.6 cm.   02/02/2020 Initial Biopsy   Diagnosis 02/02/20 Breast, right, needle core biopsy, upper outer quadrant, x clip - LOBULAR CARCINOMA IN SITU WITH PLEOMORPHIC FEATURES AND CALCIFICATIONS, INVOLVING ADENOSIS. SEE NOTE Diagnosis Note Immunohistochemical stain for E-cadherin is negative in the lesional cells, consistent with a lobular phenotype. Immunostains for p63, SMM 1 and calponin do not show evidence of invasive carcinoma.    02/04/2020 Initial Diagnosis   Lobular carcinoma in situ (LCIS) of right breast   03/18/2020 Surgery   RIGHT BREAST LUMPECTOMY WITH RADIOACTIVE SEED LOCALIZATION by Dr Marla    03/18/2020 Pathology Results   FINAL MICROSCOPIC DIAGNOSIS:   A. BREAST, RIGHT, LUMPECTOMY:  - Pleomorphic lobular carcinoma in situ with calcifications and  underlying complex sclerosing lesion, adenosis and fibroadenomatoid  change.  - Margins of resection are not involved (Closest margins: < 1 mm,  anterior, posterior, inferior and medial).  - Biopsy site.    COMMENT:   P63, Calponin and SMM-1 demonstrate the presence of myoepithelium in the  select focus.    Metastatic breast cancer to bone and brain  05/04/2022 Imaging    IMPRESSION: Cervical spondylosis, as described.   Nonspecific straightening of the expected cervical lordosis.   07/24/2022 Imaging    IMPRESSION: 1. Extensive osseous metastatic disease with pathologic fracture at the base of  dens and C2 right lateral mass. Extraosseous tumor at C1 and C2 likely impinging on the right C2 and C3 nerve roots. 2.  Degenerative cord impingement at C4-5 to C6-7. Biforaminal impingement at C5-6 and C6-7.   08/02/2022 Initial Diagnosis   Cancer, metastatic to bone ([name])   08/03/2022 PET scan    IMPRESSION: 1. Large volume osseous metastasis. 2. Low right cervical and probable right axillary nodal metastasis. 3. Subtle heterogeneous activity throughout the liver with suggestion of small liver lesions (likely new compared to chest CT of 07/16/2020). Findings are overall moderately suspicious for hepatic metastasis. Pre and post contrast abdominal MRI (preferred) or CT could confirm. 4. Right-sided pleural thickening and trace pleural fluid. Right base airspace disease is favored to represent chronic atelectasis. 5. Aortic atherosclerosis (ICD10-I70.0) and emphysema (ICD10-J43.9).     10/26/2022 - 04/12/2023 Chemotherapy   Patient is on Treatment Plan : BREAST Paclitaxel  D1,8,15 + Trastuzumab  D1 + Pertuzumab  D1 q21d x 8 cycles / Trastuzumab  D1 + Pertuzumab  D1 q21d x 4 cycles     05/03/2023 -  Chemotherapy   Patient is on Treatment Plan : BREAST MAINTENANCE Trastuzumab  IV (6) or SQ (600) D1 q21d X 11 Cycles        Discussed the use of AI scribe software for clinical note transcription with the patient, who gave verbal consent to proceed.  History of Present Illness Martha [name] is a 63 year old female with metastatic breast cancer who presents for follow-up.  She has gained 5.5 pounds since her last visit, attributed to consuming two high-calorie Ensure drinks per day. Her pain level is stable and managed with Tylenol  and Lyrica , without the use of oxycodone .  She is on Xeloda , two tablets twice a day for seven days on and seven days off, and Tucanib, two tablets twice a day every day. Medications are obtained from [facility].  Diarrhea medication is effective. She experiences a persistent cough and uses Mucinex . No fever or shortness of breath, but there is difficulty with  long-distance walking and stairs.  She continues with lab infusions every three weeks and has a PET scan planned for November. A brain MRI is scheduled for November.     All other systems were reviewed with the patient and are negative.  MEDICAL HISTORY:  Past Medical History:  Diagnosis Date   Anxiety    Cancer ([name]) 2022   right breast LCIS   Cancer ([name]) 2019   left breast   Depression    Dysrhythmia    SVT, s/p ablation ~ 2012 in at [facility]   Ectopic pregnancy    Family history of breast cancer    Family history of melanoma    History of radiation therapy 04/22/18-06/03/18   Left Breast, left SCV, axilla 50 Gy in 25 fractions, Left breast boost 10 Gy in 5 fractions.    Hypothyroidism    Personal history of radiation therapy    PONV (postoperative nausea and vomiting)    Thyroid  disease     SURGICAL HISTORY: Past Surgical History:  Procedure Laterality Date   APPENDECTOMY     BILATERAL SALPINGECTOMY     BREAST BIOPSY Right 2014   fibroadenoma   BREAST BIOPSY Left 01/16/2018   BREAST BIOPSY Right 02/02/2020   LCIS   BREAST BIOPSY Right 08/04/2020   x2 LCIS   BREAST BIOPSY Right 08/13/2020   x2   BREAST EXCISIONAL BIOPSY Left 1990   benign   BREAST EXCISIONAL BIOPSY  Right 02/02/2020   LCIS   BREAST LUMPECTOMY Left 01/22/2018   BREAST LUMPECTOMY WITH AXILLARY LYMPH NODE BIOPSY Left 02/21/2018   Procedure: LEFT BREAST LUMPECTOMY WITH AXILLARY LYMPH NODE BIOPSY;  Surgeon: [name], MD;  Location: [facility] OR;  Service: General;  Laterality: Left;   BREAST LUMPECTOMY WITH RADIOACTIVE SEED LOCALIZATION Right 03/18/2020   Procedure: RIGHT BREAST LUMPECTOMY WITH RADIOACTIVE SEED LOCALIZATION;  Surgeon: [name], MD;  Location: [facility] OR;  Service: General;  Laterality: Right;   BREAST SURGERY Left 1993   cyst removed   ENDOMETRIAL ABLATION     EYE SURGERY     GLAUCOMA SURGERY Bilateral    IR IMAGING GUIDED PORT INSERTION  10/16/2022   POSTERIOR  CERVICAL FUSION/FORAMINOTOMY N/A 08/17/2022   Procedure: Cervical One-Cervical Four POSTERIOR CERVICAL FUSION,  Cervical One LAMINECTOMY REDUCTION OF Cervical Two Fracture, Biopsy of Right Cerical Two Pars  Lesion;  Surgeon: Debby Dorn MATSU, MD;  Location: [facility] OR;  Service: Neurosurgery;  Laterality: N/A;   POSTERIOR CERVICAL FUSION/FORAMINOTOMY N/A 09/06/2022   Procedure: Posterior Cervical Fusion, Foraminotomy , Cervical Five-Six, Cervical Six-Seven; Extension of  fusion Cervical Four- Thoracic One;  Surgeon: Debby Dorn MATSU, MD;  Location: [facility] OR;  Service: Neurosurgery;  Laterality: N/A;   RADIOACTIVE SEED GUIDED EXCISIONAL BREAST BIOPSY Right 04/28/2021   Procedure: RADIOACTIVE SEED GUIDED EXCISIONAL RIGHT BREAST BIOPSY X2;  Surgeon: [name], MD;  Location: [facility];  Service: General;  Laterality: Right;   RE-EXCISION OF BREAST LUMPECTOMY Left 03/20/2018   Procedure: RE-EXCISION OF BREAST LUMPECTOMY;  Surgeon: [name], MD;  Location: [facility];  Service: General;  Laterality: Left;    I have reviewed the social history and family history with the patient and they are unchanged from previous note.  ALLERGIES:  is allergic to morphine and codeine.  MEDICATIONS:  Current Outpatient Medications  Medication Sig Dispense Refill   acetaminophen  (TYLENOL ) 500 MG tablet Take 2 tablets (1,000 mg total) by mouth every 8 (eight) hours. 30 tablet 0   capecitabine  (XELODA ) 500 MG tablet TAKE 2 TABLETS TWICE DAILY FOR 7 DAYS ON, THEN 7 DAYS OFF (TAKE AFTER MEALS) 56 tablet 2   citalopram  (CELEXA ) 40 MG tablet Take 1 tablet (40 mg total) by mouth daily. 90 tablet 0   diphenoxylate -atropine  (LOMOTIL ) 2.5-0.025 MG tablet Take 1 tablet by mouth 4 (four) times daily as needed for diarrhea or loose stools. 90 tablet 3   levothyroxine  (SYNTHROID ) 75 MCG tablet Take 1 tablet (75 mcg total) by mouth daily before breakfast. 90 tablet 3   loperamide  (IMODIUM  A-D) 2  MG tablet Take 1 tablet (2 mg total) by mouth 4 (four) times daily as needed for diarrhea or loose stools. 90 tablet 0   Mouthwashes (MOUTH RINSE) LIQD solution 15 mLs by Mouth Rinse route as needed (for oral care).  0   ondansetron  (ZOFRAN ) 8 MG tablet Take 1 tablet (8 mg total) by mouth every 8 (eight) hours as needed for nausea or vomiting. 20 tablet 2   OVER THE COUNTER MEDICATION Take 1 tablet by mouth daily. Protandim Supplement     pregabalin  (LYRICA ) 25 MG capsule Take 1 capsule (25 mg total) by mouth 3 (three) times daily. 90 capsule 3   tucatinib  (TUKYSA ) 150 MG tablet Take 2 tablets (300 mg total) by mouth 2 (two) times daily. Take as instructed per MD. 120 tablet 2   No current facility-administered medications for this visit.    PHYSICAL EXAMINATION: ECOG PERFORMANCE STATUS:  2 - Symptomatic, <50% confined to bed  Vitals:   06/05/24 1127  BP: 110/64  Pulse: 63  Resp: 16  Temp: 98.3 F (36.8 C)  SpO2: 97%   Wt Readings from Last 3 Encounters:  06/05/24 75 lb 9.6 oz (34.3 kg)  05/15/24 75 lb 8 oz (34.2 kg)  04/24/24 72 lb 8 oz (32.9 kg)     GENERAL:alert, no distress and comfortable SKIN: skin color, texture, turgor are normal, no rashes or significant lesions EYES: normal, Conjunctiva are pink and non-injected, sclera clear NECK: supple, thyroid  normal size, non-tender, without nodularity LYMPH:  no palpable lymphadenopathy in the cervical, axillary  LUNGS: clear to auscultation and percussion with normal breathing effort HEART: regular rate & rhythm and no murmurs and no lower extremity edema ABDOMEN:abdomen soft, non-tender and normal bowel sounds Musculoskeletal:no cyanosis of digits and no clubbing  NEURO: alert & oriented x 3 with fluent speech, no focal motor/sensory deficits  Physical Exam CHEST: Crackles at lung bases.  LABORATORY DATA:  I have reviewed the data as listed    Latest Ref Rng & Units 06/05/2024   10:37 AM 05/15/2024    8:35 AM 04/24/2024     8:28 AM  CBC  WBC 4.0 - 10.5 K/uL 6.8  7.0  5.1   Hemoglobin 12.0 - 15.0 g/dL 85.2  85.6  85.3   Hematocrit 36.0 - 46.0 % 40.7  40.2  41.2   Platelets 150 - 400 K/uL 153  176  147         Latest Ref Rng & Units 06/05/2024   10:37 AM 05/15/2024    8:35 AM 04/24/2024    8:28 AM  CMP  Glucose 70 - 99 mg/dL 896  99  95   BUN 8 - 23 mg/dL 16  19  15    Creatinine 0.44 - 1.00 mg/dL 9.45  9.45  9.52   Sodium 135 - 145 mmol/L 136  137  136   Potassium 3.5 - 5.1 mmol/L 4.5  4.4  4.1   Chloride 98 - 111 mmol/L 100  101  102   CO2 22 - 32 mmol/L 31  32  31   Calcium 8.9 - 10.3 mg/dL 89.8  9.8  9.4   Total Protein 6.5 - 8.1 g/dL 7.0  6.9  6.5   Total Bilirubin 0.0 - 1.2 mg/dL 0.9  0.6  0.9   Alkaline Phos 38 - 126 U/L 77  82  66   AST 15 - 41 U/L 21  18  19    ALT 0 - 44 U/L 18  21  30        RADIOGRAPHIC STUDIES: I have personally reviewed the radiological images as listed and agreed with the findings in the report. No results found.    Orders Placed This Encounter  Procedures   NM PET Image Restag (PS) Skull Base To Thigh    Standing Status:   Future    Expected Date:   07/10/2024    Expiration Date:   06/05/2025    If indicated for the ordered procedure, I authorize the administration of a radiopharmaceutical per Radiology protocol:   Yes    Preferred imaging location?:   [facility]   All questions were answered. The patient knows to call the clinic with any problems, questions or concerns. No barriers to learning was detected. The total time spent in the appointment was 30 minutes, including review of chart and various tests results, discussions about plan of care and coordination  of care plan     Onita Mattock, MD 06/05/2024

## 2024-06-05 NOTE — Patient Instructions (Signed)
 [facility] - A DEPT OF [facility]  Discharge Instructions: Thank you for choosing [facility] to provide your oncology and hematology care.   If you have a lab appointment with the Cancer Center, please go directly to the Cancer Center and check in at the registration area.   Wear comfortable clothing and clothing appropriate for easy access to any Portacath or PICC line.   We strive to give you quality time with your provider. You may need to reschedule your appointment if you arrive late (15 or more minutes).  Arriving late affects you and other patients whose appointments are after yours.  Also, if you miss three or more appointments without notifying the office, you may be dismissed from the clinic at the provider's discretion.      For prescription refill requests, have your pharmacy contact our office and allow 72 hours for refills to be completed.    Today you received the following chemotherapy and/or immunotherapy agents kanjinti      To help prevent nausea and vomiting after your treatment, we encourage you to take your nausea medication as directed.  BELOW ARE SYMPTOMS THAT SHOULD BE REPORTED IMMEDIATELY: *FEVER GREATER THAN 100.4 F (38 C) OR HIGHER *CHILLS OR SWEATING *NAUSEA AND VOMITING THAT IS NOT CONTROLLED WITH YOUR NAUSEA MEDICATION *UNUSUAL SHORTNESS OF BREATH *UNUSUAL BRUISING OR BLEEDING *URINARY PROBLEMS (pain or burning when urinating, or frequent urination) *BOWEL PROBLEMS (unusual diarrhea, constipation, pain near the anus) TENDERNESS IN MOUTH AND THROAT WITH OR WITHOUT PRESENCE OF ULCERS (sore throat, sores in mouth, or a toothache) UNUSUAL RASH, SWELLING OR PAIN  UNUSUAL VAGINAL DISCHARGE OR ITCHING   Items with * indicate a potential emergency and should be followed up as soon as possible or go to the Emergency Department if any problems should occur.  Please show the CHEMOTHERAPY ALERT CARD or IMMUNOTHERAPY  ALERT CARD at check-in to the Emergency Department and triage nurse.  Should you have questions after your visit or need to cancel or reschedule your appointment, please contact [facility]  Dept: [phone]  and follow the prompts.  Office hours are 8:00 a.m. to 4:30 p.m. Monday - Friday. Please note that voicemails left after 4:00 p.m. may not be returned until the following business day.  We are closed weekends and major holidays. You have access to a nurse at all times for urgent questions. Please call the main number to the clinic Dept: [phone] and follow the prompts.   For any non-urgent questions, you may also contact your provider using MyChart. We now offer e-Visits for anyone 81 and older to request care online for non-urgent symptoms. For details visit [email or web address].   Also download the MyChart app! Go to the app store, search "MyChart", open the app, select [facility], and log in with your MyChart username and password.

## 2024-06-05 NOTE — Assessment & Plan Note (Signed)
-  Diagnosed in 06/2022, ER-/PR-/HER2+ --PET scan from 08/03/2022 showed diffuse bone mets  -she underwent cervical laminectomy and fusion by Dr. Debby on 08/17/2022, biopsy confirmed metastatic breast cancer, ER/PR negative, HER2 positive. -due to cervical cord compression with myelopathy, she underwent a second cervical spine surgery on September 06, 2022, and completed inpatient rehabitation. -She has completed brain, cervical and lumbar spine radiation in Feb 2024 -She was recently hospitalized for altered mental status and confusion, probably secondary to medication -She started first line systemic chemotherapy with weekly Taxol  and trastuzumab /Perjeta  on 10/25/22. She has tolerated well so far and her pain has improved overall -PET scan from Jan 01, 2023 showed complete metabolic response, no hypermetabolic uptake in diffuse bone lesions, no other new lesions.  She is clinically also doing much better.  Given her excellent response to chemo, we will change her Taxol  to 2 weeks on and 1 week off -She Unfortunately developed more more brain metastasis on recent brain MRI. -I personally reviewed her restaging PET scan from April 13, 2023, which showed stable disease, no new lesions. -Due to her new brain metastasis, I changed her therapy to Tucatinib , capecitabine  and trastuzumab , for better brain penetration. She started in late August 2024, tolerating well so far.  Her case was reviewed in CNS tumor board, Dr. Izell plan to hold on whole brain radiation since she is starting new Her2 antibody and monitor her brain mets closely.  -restaging MRI brian 08/30/2023 showed partial response, PET in 06/2023 showed no hypermetabolic lesions  -brain MRI 12/27/2023 showed progressive brain mets, she started whole brain RT on 5/19 and completed on 02/01/2024  -she is also on Zometa  every 3 months -continue Tucatinib , capecitabine  and trastuzumab  -PET 02/19/2024 showed no hypermetabolic disease.

## 2024-06-11 ENCOUNTER — Telehealth: Payer: Self-pay | Admitting: Hematology

## 2024-06-11 NOTE — Telephone Encounter (Signed)
 Martha [name] has been contacted and made aware of her 10/30 appts.

## 2024-06-16 ENCOUNTER — Other Ambulatory Visit: Payer: Self-pay | Admitting: Nurse Practitioner

## 2024-06-16 DIAGNOSIS — C7951 Secondary malignant neoplasm of bone: Secondary | ICD-10-CM

## 2024-06-16 DIAGNOSIS — Z515 Encounter for palliative care: Secondary | ICD-10-CM

## 2024-06-16 DIAGNOSIS — M792 Neuralgia and neuritis, unspecified: Secondary | ICD-10-CM

## 2024-06-16 MED ORDER — PREGABALIN 25 MG PO CAPS: 25.0000 mg | ORAL_CAPSULE | Freq: Three times a day (TID) | ORAL | 3 refills | Status: DC

## 2024-06-23 ENCOUNTER — Other Ambulatory Visit: Payer: Self-pay

## 2024-06-23 DIAGNOSIS — Z515 Encounter for palliative care: Secondary | ICD-10-CM

## 2024-06-23 DIAGNOSIS — R197 Diarrhea, unspecified: Secondary | ICD-10-CM

## 2024-06-23 DIAGNOSIS — C7951 Secondary malignant neoplasm of bone: Secondary | ICD-10-CM

## 2024-06-23 MED ORDER — DIPHENOXYLATE-ATROPINE 2.5-0.025 MG PO TABS: 1.0000 | ORAL_TABLET | Freq: Four times a day (QID) | ORAL | 3 refills | Status: AC | PRN

## 2024-06-26 ENCOUNTER — Inpatient Hospital Stay

## 2024-06-26 VITALS — BP 103/63 | HR 72 | Temp 97.7°F | Resp 12 | Ht 62.0 in | Wt 74.5 lb

## 2024-06-26 DIAGNOSIS — C50312 Malignant neoplasm of lower-inner quadrant of left female breast: Secondary | ICD-10-CM | POA: Diagnosis not present

## 2024-06-26 DIAGNOSIS — C787 Secondary malignant neoplasm of liver and intrahepatic bile duct: Secondary | ICD-10-CM | POA: Diagnosis not present

## 2024-06-26 DIAGNOSIS — Z171 Estrogen receptor negative status [ER-]: Secondary | ICD-10-CM | POA: Diagnosis not present

## 2024-06-26 DIAGNOSIS — C7951 Secondary malignant neoplasm of bone: Secondary | ICD-10-CM

## 2024-06-26 DIAGNOSIS — Z170 Estrogen receptor positive status [ER+]: Secondary | ICD-10-CM

## 2024-06-26 DIAGNOSIS — Z79899 Other long term (current) drug therapy: Secondary | ICD-10-CM | POA: Diagnosis not present

## 2024-06-26 LAB — CBC WITH DIFFERENTIAL (CANCER CENTER ONLY)
Abs Immature Granulocytes: 0.05 K/uL (ref 0.00–0.07)
Basophils Absolute: 0 K/uL (ref 0.0–0.1)
Basophils Relative: 1 %
Eosinophils Absolute: 0 K/uL (ref 0.0–0.5)
Eosinophils Relative: 1 %
HCT: 41.5 % (ref 36.0–46.0)
Hemoglobin: 14.8 g/dL (ref 12.0–15.0)
Immature Granulocytes: 1 %
Lymphocytes Relative: 14 %
Lymphs Abs: 0.8 K/uL (ref 0.7–4.0)
MCH: 37.9 pg — ABNORMAL HIGH (ref 26.0–34.0)
MCHC: 35.7 g/dL (ref 30.0–36.0)
MCV: 106.1 fL — ABNORMAL HIGH (ref 80.0–100.0)
Monocytes Absolute: 0.5 K/uL (ref 0.1–1.0)
Monocytes Relative: 9 %
Neutro Abs: 4.4 K/uL (ref 1.7–7.7)
Neutrophils Relative %: 74 %
Platelet Count: 160 K/uL (ref 150–400)
RBC: 3.91 MIL/uL (ref 3.87–5.11)
RDW: 15.3 % (ref 11.5–15.5)
WBC Count: 5.8 K/uL (ref 4.0–10.5)
nRBC: 0 % (ref 0.0–0.2)

## 2024-06-26 LAB — CMP (CANCER CENTER ONLY)
ALT: 14 U/L (ref 0–44)
AST: 18 U/L (ref 15–41)
Albumin: 4.6 g/dL (ref 3.5–5.0)
Alkaline Phosphatase: 88 U/L (ref 38–126)
Anion gap: 7 (ref 5–15)
BUN: 17 mg/dL (ref 8–23)
CO2: 30 mmol/L (ref 22–32)
Calcium: 9.9 mg/dL (ref 8.9–10.3)
Chloride: 100 mmol/L (ref 98–111)
Creatinine: 0.53 mg/dL (ref 0.44–1.00)
GFR, Estimated: >60 mL/min (ref >60)
Glucose, Bld: 110 mg/dL — ABNORMAL HIGH (ref 70–99)
Potassium: 4.2 mmol/L (ref 3.5–5.1)
Sodium: 137 mmol/L (ref 135–145)
Total Bilirubin: 0.7 mg/dL (ref 0.0–1.2)
Total Protein: 7 g/dL (ref 6.5–8.1)

## 2024-06-26 MED ORDER — TRASTUZUMAB-ANNS CHEMO 150 MG IV SOLR
6.0000 mg/kg | Freq: Once | INTRAVENOUS | Status: AC
  Administered 2024-06-26: 210 mg via INTRAVENOUS
  Filled 2024-06-26: qty 10

## 2024-06-26 MED ORDER — ACETAMINOPHEN 325 MG PO TABS
650.0000 mg | ORAL_TABLET | Freq: Once | ORAL | Status: AC
  Administered 2024-06-26: 650 mg via ORAL
  Filled 2024-06-26: qty 2

## 2024-06-26 MED ORDER — SODIUM CHLORIDE 0.9 % IV SOLN: Freq: Once | INTRAVENOUS | Status: AC

## 2024-06-26 NOTE — Patient Instructions (Addendum)
 [facility]   Discharge Instructions: Thank you for choosing [facility] to provide your oncology and hematology care.   If you have a lab appointment with the Cancer Center, please go directly to the Cancer Center and check in at the registration area.   Wear comfortable clothing and clothing appropriate for easy access to any Portacath or PICC line.   We strive to give you quality time with your provider. You may need to reschedule your appointment if you arrive late (15 or more minutes).  Arriving late affects you and other patients whose appointments are after yours.  Also, if you miss three or more appointments without notifying the office, you may be dismissed from the clinic at the provider's discretion.      For prescription refill requests, have your pharmacy contact our office and allow 72 hours for refills to be completed.    Today you received the following chemotherapy and/or immunotherapy agents: Trastuzumab (Kanjinti)      To help prevent nausea and vomiting after your treatment, we encourage you to take your nausea medication as directed.  BELOW ARE SYMPTOMS THAT SHOULD BE REPORTED IMMEDIATELY: *FEVER GREATER THAN 100.4 F (38 C) OR HIGHER *CHILLS OR SWEATING *NAUSEA AND VOMITING THAT IS NOT CONTROLLED WITH YOUR NAUSEA MEDICATION *UNUSUAL SHORTNESS OF BREATH *UNUSUAL BRUISING OR BLEEDING *URINARY PROBLEMS (pain or burning when urinating, or frequent urination) *BOWEL PROBLEMS (unusual diarrhea, constipation, pain near the anus) TENDERNESS IN MOUTH AND THROAT WITH OR WITHOUT PRESENCE OF ULCERS (sore throat, sores in mouth, or a toothache) UNUSUAL RASH, SWELLING OR PAIN  UNUSUAL VAGINAL DISCHARGE OR ITCHING   Items with * indicate a potential emergency and should be followed up as soon as possible or go to the Emergency Department if any problems should occur.  Please show the CHEMOTHERAPY ALERT CARD or  IMMUNOTHERAPY ALERT CARD at check-in to the Emergency Department and triage nurse.  Should you have questions after your visit or need to cancel or reschedule your appointment, please contact [facility]  Dept: [phone]  and follow the prompts.  Office hours are 8:00 a.m. to 4:30 p.m. Monday - Friday. Please note that voicemails left after 4:00 p.m. may not be returned until the following business day.  We are closed weekends and major holidays. You have access to a nurse at all times for urgent questions. Please call the main number to the clinic Dept: [phone] and follow the prompts.   For any non-urgent questions, you may also contact your provider using MyChart. We now offer e-Visits for anyone 33 and older to request care online for non-urgent symptoms. For details visit [email or web address].   Also download the MyChart app! Go to the app store, search "MyChart", open the app, select [facility], and log in with your MyChart username and password.

## 2024-07-02 ENCOUNTER — Telehealth: Payer: Self-pay | Admitting: Hematology

## 2024-07-02 NOTE — Telephone Encounter (Signed)
 LVM  regarding her upcoming appts.

## 2024-07-09 ENCOUNTER — Encounter: Payer: Self-pay | Admitting: Hematology

## 2024-07-10 ENCOUNTER — Encounter (HOSPITAL_COMMUNITY)
Admission: RE | Admit: 2024-07-10 | Discharge: 2024-07-10 | Disposition: A | Discharge status: Home / Self Care | Source: Ambulatory Visit | Attending: Hematology | Admitting: Hematology

## 2024-07-10 ENCOUNTER — Ambulatory Visit
Admission: RE | Admit: 2024-07-10 | Discharge: 2024-07-10 | Disposition: A | Discharge status: Home / Self Care | Source: Ambulatory Visit | Attending: Internal Medicine | Admitting: Internal Medicine

## 2024-07-10 DIAGNOSIS — C7931 Secondary malignant neoplasm of brain: Secondary | ICD-10-CM

## 2024-07-10 DIAGNOSIS — C7951 Secondary malignant neoplasm of bone: Secondary | ICD-10-CM | POA: Diagnosis not present

## 2024-07-10 LAB — GLUCOSE, CAPILLARY: Glucose-Capillary: 95 mg/dL (ref 70–99)

## 2024-07-10 MED ORDER — GADOPICLENOL 0.5 MMOL/ML IV SOLN
3.0000 mL | Freq: Once | INTRAVENOUS | Status: AC | PRN
  Administered 2024-07-10: 3 mL via INTRAVENOUS

## 2024-07-10 MED ORDER — FLUDEOXYGLUCOSE F - 18 (FDG) INJECTION
5.1000 | Freq: Once | INTRAVENOUS | Status: AC
  Administered 2024-07-10: 5.1 via INTRAVENOUS

## 2024-07-13 NOTE — Assessment & Plan Note (Addendum)
-  Diagnosed in 06/2022, ER-/PR-/HER2+ --PET scan from 08/03/2022 showed diffuse bone mets  -she underwent cervical laminectomy and fusion by Dr. Debby on 08/17/2022, biopsy confirmed metastatic breast cancer, ER/PR negative, HER2 positive. -due to cervical cord compression with myelopathy, she underwent a second cervical spine surgery on September 06, 2022, and completed inpatient rehabitation. -She has completed brain, cervical and lumbar spine radiation in Feb 2024 -She was recently hospitalized for altered mental status and confusion, probably secondary to medication -She started first line systemic chemotherapy with weekly Taxol  and trastuzumab /Perjeta  on 10/25/22. She has tolerated well so far and her pain has improved overall -PET scan from Jan 01, 2023 showed complete metabolic response, no hypermetabolic uptake in diffuse bone lesions, no other new lesions.  She is clinically also doing much better.  Given her excellent response to chemo, we will change her Taxol  to 2 weeks on and 1 week off -She Unfortunately developed more more brain metastasis on recent brain MRI. -I personally reviewed her restaging PET scan from April 13, 2023, which showed stable disease, no new lesions. -Due to her new brain metastasis, I changed her therapy to Tucatinib , capecitabine  and trastuzumab , for better brain penetration. She started in late August 2024, tolerating well so far.  Her case was reviewed in CNS tumor board, Dr. Izell plan to hold on whole brain radiation since she is starting new Her2 antibody and monitor her brain mets closely.  -restaging MRI brian 08/30/2023 showed partial response, PET in 06/2023 showed no hypermetabolic lesions  -brain MRI 12/27/2023 showed progressive brain mets, she started whole brain RT on 5/19 and completed on 02/01/2024  -she is also on Zometa  every 3 months -continue Tucatinib , capecitabine  and trastuzumab  -PET 02/19/2024 and 07/07/2024 showed no hypermetabolic disease. -brain  MRI 88/89/7974 showed improved brian mets

## 2024-07-14 ENCOUNTER — Inpatient Hospital Stay (HOSPITAL_BASED_OUTPATIENT_CLINIC_OR_DEPARTMENT_OTHER): Admitting: Hematology

## 2024-07-14 ENCOUNTER — Inpatient Hospital Stay

## 2024-07-14 ENCOUNTER — Encounter: Payer: Self-pay | Admitting: Nurse Practitioner

## 2024-07-14 ENCOUNTER — Encounter

## 2024-07-14 ENCOUNTER — Inpatient Hospital Stay: Attending: Genetic Counselor | Admitting: Internal Medicine

## 2024-07-14 ENCOUNTER — Inpatient Hospital Stay: Admitting: Nurse Practitioner

## 2024-07-14 VITALS — BP 101/59 | HR 87 | Temp 97.6°F | Resp 16 | Ht 62.0 in | Wt 75.4 lb

## 2024-07-14 DIAGNOSIS — C7951 Secondary malignant neoplasm of bone: Secondary | ICD-10-CM | POA: Insufficient documentation

## 2024-07-14 DIAGNOSIS — R530 Neoplastic (malignant) related fatigue: Secondary | ICD-10-CM | POA: Diagnosis not present

## 2024-07-14 DIAGNOSIS — Z923 Personal history of irradiation: Secondary | ICD-10-CM | POA: Insufficient documentation

## 2024-07-14 DIAGNOSIS — Z79899 Other long term (current) drug therapy: Secondary | ICD-10-CM | POA: Diagnosis not present

## 2024-07-14 DIAGNOSIS — C7931 Secondary malignant neoplasm of brain: Secondary | ICD-10-CM | POA: Diagnosis not present

## 2024-07-14 DIAGNOSIS — Z515 Encounter for palliative care: Secondary | ICD-10-CM | POA: Diagnosis not present

## 2024-07-14 DIAGNOSIS — E46 Unspecified protein-calorie malnutrition: Secondary | ICD-10-CM | POA: Diagnosis not present

## 2024-07-14 DIAGNOSIS — C50312 Malignant neoplasm of lower-inner quadrant of left female breast: Secondary | ICD-10-CM | POA: Insufficient documentation

## 2024-07-14 DIAGNOSIS — Z5112 Encounter for antineoplastic immunotherapy: Secondary | ICD-10-CM | POA: Insufficient documentation

## 2024-07-14 DIAGNOSIS — M792 Neuralgia and neuritis, unspecified: Secondary | ICD-10-CM

## 2024-07-14 NOTE — Progress Notes (Signed)
 [facility]   [phone] Fax:([phone]) [phone]   Clinic Follow up Note   Patient Care Team: [name], MD as PCP - General (Family Medicine) [name], MD as Consulting Physician (General Surgery) [name], MD as Consulting Physician (Hematology) [name], MD as Attending Physician (Radiation Oncology) [name], [name], NP as Nurse Practitioner (Nurse Practitioner)  Date of Service:  07/14/2024  CHIEF COMPLAINT: f/u of metastatic breast cancer  CURRENT THERAPY:  Trastuzumab  every 3 weeks, Xeloda  and Tucatinib    Oncology History   Metastatic breast cancer to bone and brain -Diagnosed in 06/2022, ER-/PR-/HER2+ --PET scan from 08/03/2022 showed diffuse bone mets  -she underwent cervical laminectomy and fusion by Dr. Debby on 08/17/2022, biopsy confirmed metastatic breast cancer, ER/PR negative, HER2 positive. -due to cervical cord compression with myelopathy, she underwent a second cervical spine surgery on September 06, 2022, and completed inpatient rehabitation. -She has completed brain, cervical and lumbar spine radiation in Feb 2024 -She was recently hospitalized for altered mental status and confusion, probably secondary to medication -She started first line systemic chemotherapy with weekly Taxol  and trastuzumab /Perjeta  on 10/25/22. She has tolerated well so far and her pain has improved overall -PET scan from Jan 01, 2023 showed complete metabolic response, no hypermetabolic uptake in diffuse bone lesions, no other new lesions.  She is clinically also doing much better.  Given her excellent response to chemo, we will change her Taxol  to 2 weeks on and 1 week off -She Unfortunately developed more more brain metastasis on recent brain MRI. -I personally reviewed her restaging PET scan from April 13, 2023, which showed stable disease, no new lesions. -Due to her new brain metastasis, I changed her therapy to Tucatinib , capecitabine  and  trastuzumab , for better brain penetration. She started in late August 2024, tolerating well so far.  Her case was reviewed in CNS tumor board, Dr. Izell plan to hold on whole brain radiation since she is starting new Her2 antibody and monitor her brain mets closely.  -restaging MRI brian 08/30/2023 showed partial response, PET in 06/2023 showed no hypermetabolic lesions  -brain MRI 12/27/2023 showed progressive brain mets, she started whole brain RT on 5/19 and completed on 02/01/2024  -she is also on Zometa  every 3 months -continue Tucatinib , capecitabine  and trastuzumab  -PET 02/19/2024 and 07/07/2024 showed no hypermetabolic disease. -brain MRI 07/07/2024 showed improved brian mets    Assessment & Plan Metastatic breast cancer with brain and bone involvement Metastatic breast cancer with previous bone lesion treatment. Recent PET scan shows no active lesions, indicating effective treatment. MRI brain shows improvement post-radiation. Oral medication is well-tolerated without issues. - Continue current treatment regimen with scheduled infusion on Thursday. - Encouraged use of heating pad and massage for shoulder muscle tightness, ensuring no pressure on the spine. - Advised against dry needling without physical therapist involvement. - Encouraged daily walking and safety measures to prevent falls.  Malnutrition and underweight - I again encouraged her to take nutritional supplements, especially liquid nutrition such as Ensure.  Plan - Reviewed her PET scan and brain MRI, which showed no disease progression. - Will continue current therapy, she will return later this week for trastuzumab  infusion and continue every 3 weeks - Will continue Xeloda  and Tucatinib  at home - Follow-up in 6-7 weeks   SUMMARY OF ONCOLOGIC HISTORY: Oncology History Overview Note  Cancer Staging Malignant neoplasm of lower-inner quadrant of left breast in female, estrogen receptor positive ([name]) Staging form: Breast,  AJCC 8th Edition -  Clinical stage from 01/16/2018: Stage IA (cT1c, cN0, cM0, G2, ER+, PR+, HER2-) - Signed by [name], MD on 01/23/2018 - Pathologic: Stage IA (pT1c, pN1, cM0, G1, ER+, PR+, HER2-) - Signed by [name], MD on 04/09/2018     Malignant neoplasm of lower-inner quadrant of left breast in female, estrogen receptor positive ([name])  01/15/2018 Mammogram   Diagnositc Mammogram 01/15/18  IMPRESSION: 1. Suspicious 1.2 x 1.4 x 1.3 cm mixed echogenicity mass left breast 7 o'clock position retroareolar location at the site of palpable concern.. 2. Indeterminate Within the left breast 7:30 o'clock retroareolar location, adjacent to the palpable mass, is a 0.5 x 0.4 x 0.5 cm oval circumscribed hypoechoic mass. 3. Indeterminate calcifications within the lateral left breast. Location of these calcifications is not definitely confirmed on the true lateral view.    01/16/2018 Initial Biopsy   Diagnosis 01/16/18 1. Breast, left, needle core biopsy, 7:30 o'clock (ribbon clip) - FIBROCYSTIC CHANGES WITH SCLEROSING ADENOSIS AND CALCIFICATIONS. - FIBROADENOMATOID CHANGE. - NO MALIGNANCY IDENTIFIED. 2. Breast, left, needle core biopsy, 7 o'clock position (coil clip) - INVASIVE MAMMARY CARCINOMA, MSBR GRADE I/II. - SEE MICROSCOPIC DESCRIPTION Microscopic Comment  ADDENDUM: Immunohistochemistry for E-Cadherin is strongly positive in the tumor consistent with ductal carcinoma. (JDP:ah 01/17/18)   01/16/2018 Receptors her2   Estrogen Receptor: 100%, POSITIVE, STRONG STAINING INTENSITY Progesterone Receptor: 50%, POSITIVE, STRONG STAINING INTENSITY Proliferation Marker Ki67: 20% HER2 Negative   01/16/2018 Cancer Staging   Staging form: Breast, AJCC 8th Edition - Clinical stage from 01/16/2018: Stage IA (cT1c, cN0, cM0, G2, ER+, PR+, HER2-) - Signed by [name], MD on 01/23/2018   01/22/2018 Initial Diagnosis   Malignant neoplasm of lower-inner quadrant of left breast in female, estrogen  receptor positive ([name])   02/21/2018 Surgery    LEFT BREAST LUMPECTOMY WITH AXILLARY LYMPH NODE BIOPSY by Dr. Aron  02/21/18   02/21/2018 Pathology Results   Diagnosis 02/21/18 1. Breast, lumpectomy, Left - INVASIVE DUCTAL CARCINOMA, GRADE I, 1.6 CM. - DUCTAL CARCINOMA IN SITU, INTERMEDIATE NUCLEAR GRADE. - ANTERIOR AND MEDIAL RESECTION MARGINS ARE POSITIVE FOR CARCINOMA. - NEGATIVE FOR LYMPHOVASCULAR OR PERINEURAL INVASION. - BACKGROUND BREAST TISSUE WITH FIBROCYSTIC CHANGE, INCLUDING SCLEROSING ADENOSIS. - BIOPSY SITE CHANGES. - SEE ONCOLOGY TABLE. 2. Lymph node, sentinel, biopsy, Left Axillary #1 - METASTATIC BREAST CARCINOMA TO A LYMPH NODE, 1.0 CM IN GREATEST DIMENSION, WITH EXTRANODAL EXTENSION (1/1). 3. Lymph node, sentinel, biopsy, Left Axillary #2 - LYMPH NODE, NEGATIVE FOR CARCINOMA (0/1).    02/21/2018 Miscellaneous   Mammaprint 02/21/18 Low Risk with 10-year risk of recurrnce at 10% -No potential signifcant chemotherapy benefit   03/20/2018 Pathology Results   RE-EXCISION OF BREAST LUMPECTOMY by Dr. Aron  Diagnosis 03/20/18 1. Breast, excision, Left new anterior margin - FIBROCYSTIC CHANGES WITH ADENOSIS AND CALCIFICATIONS. - HEALING BIOPSY SITE. - THERE IS NO EVIDENCE OF MALIGNANCY. 2. Breast, excision, Left new medial margin - FIBROCYSTIC CHANGES WITH ADENOSIS AND CALCIFICATIONS. - HEALING BIOPSY SITE. - THERE IS NO EVIDENCE OF MALIGNANCY. Microscopic Comment 1. -2. The surgical resection margin(s) of the specimen were inked and microscopically evaluated. (JBK:kh 03-22-18)   04/09/2018 Cancer Staging   Staging form: Breast, AJCC 8th Edition - Pathologic: Stage IA (pT1c, pN1, cM0, G1, ER+, PR+, HER2-) - Signed by [name], MD on 04/09/2018   04/22/2018 - 06/03/2018 Radiation Therapy   Radaiton with Dr. Izell 04/22/18-06/03/18   05/2018 -  Anti-estrogen oral therapy   Letrozole  2.5mg  started 05/2018    Survivorship   Per Lacie  Ann, NP    05/04/2022  Imaging    IMPRESSION: Cervical spondylosis, as described.   Nonspecific straightening of the expected cervical lordosis.   07/24/2022 Imaging    IMPRESSION: 1. Extensive osseous metastatic disease with pathologic fracture at the base of dens and C2 right lateral mass. Extraosseous tumor at C1 and C2 likely impinging on the right C2 and C3 nerve roots. 2. Degenerative cord impingement at C4-5 to C6-7. Biforaminal impingement at C5-6 and C6-7.   08/03/2022 PET scan    IMPRESSION: 1. Large volume osseous metastasis. 2. Low right cervical and probable right axillary nodal metastasis. 3. Subtle heterogeneous activity throughout the liver with suggestion of small liver lesions (likely new compared to chest CT of 07/16/2020). Findings are overall moderately suspicious for hepatic metastasis. Pre and post contrast abdominal MRI (preferred) or CT could confirm. 4. Right-sided pleural thickening and trace pleural fluid. Right base airspace disease is favored to represent chronic atelectasis. 5. Aortic atherosclerosis (ICD10-I70.0) and emphysema (ICD10-J43.9).     08/11/2022 Genetic Testing   Negative genetic testing on the CancerNext-Expanded+RNAinsight panel.  The report date is August 11, 2022.  The CancerNext-Expanded gene panel offered by [facility] and includes sequencing and rearrangement analysis for the following 77 genes: AIP, ALK, APC*, ATM*, AXIN2, BAP1, BARD1, BLM, BMPR1A, BRCA1*, BRCA2*, BRIP1*, CDC73, CDH1*, CDK4, CDKN1B, CDKN2A, CHEK2*, CTNNA1, DICER1, FANCC, FH, FLCN, GALNT12, KIF1B, LZTR1, MAX, MEN1, MET, MLH1*, MSH2*, MSH3, MSH6*, MUTYH*, NBN, NF1*, NF2, NTHL1, PALB2*, PHOX2B, PMS2*, POT1, PRKAR1A, PTCH1, PTEN*, RAD51C*, RAD51D*, RB1, RECQL, RET, SDHA, SDHAF2, SDHB, SDHC, SDHD, SMAD4, SMARCA4, SMARCB1, SMARCE1, STK11, SUFU, TMEM127, TP53*, TSC1, TSC2, VHL and XRCC2 (sequencing and deletion/duplication); EGFR, EGLN1, HOXB13, KIT, MITF, PDGFRA, POLD1, and POLE  (sequencing only); EPCAM and GREM1 (deletion/duplication only). DNA and RNA analyses performed for * genes.    Lobular carcinoma in situ (LCIS) of right breast  01/20/2020 Mammogram   Diagnostic Mammogram 01/20/20 IMPRESSION: 1.  Stable post lumpectomy changes of the left breast.   2. Suspicious microcalcifications over the right upper outer quadrant spanning 3.6 cm.   02/02/2020 Initial Biopsy   Diagnosis 02/02/20 Breast, right, needle core biopsy, upper outer quadrant, x clip - LOBULAR CARCINOMA IN SITU WITH PLEOMORPHIC FEATURES AND CALCIFICATIONS, INVOLVING ADENOSIS. SEE NOTE Diagnosis Note Immunohistochemical stain for E-cadherin is negative in the lesional cells, consistent with a lobular phenotype. Immunostains for p63, SMM 1 and calponin do not show evidence of invasive carcinoma.    02/04/2020 Initial Diagnosis   Lobular carcinoma in situ (LCIS) of right breast   03/18/2020 Surgery   RIGHT BREAST LUMPECTOMY WITH RADIOACTIVE SEED LOCALIZATION by Dr Marla    03/18/2020 Pathology Results   FINAL MICROSCOPIC DIAGNOSIS:   A. BREAST, RIGHT, LUMPECTOMY:  - Pleomorphic lobular carcinoma in situ with calcifications and  underlying complex sclerosing lesion, adenosis and fibroadenomatoid  change.  - Margins of resection are not involved (Closest margins: < 1 mm,  anterior, posterior, inferior and medial).  - Biopsy site.    COMMENT:   P63, Calponin and SMM-1 demonstrate the presence of myoepithelium in the  select focus.    Metastatic breast cancer to bone and brain  05/04/2022 Imaging    IMPRESSION: Cervical spondylosis, as described.   Nonspecific straightening of the expected cervical lordosis.   07/24/2022 Imaging    IMPRESSION: 1. Extensive osseous metastatic disease with pathologic fracture at the base of dens and C2 right lateral mass. Extraosseous tumor at C1 and C2 likely impinging on the right C2 and C3  nerve roots. 2. Degenerative cord impingement at C4-5 to  C6-7. Biforaminal impingement at C5-6 and C6-7.   08/02/2022 Initial Diagnosis   Cancer, metastatic to bone ([name])   08/03/2022 PET scan    IMPRESSION: 1. Large volume osseous metastasis. 2. Low right cervical and probable right axillary nodal metastasis. 3. Subtle heterogeneous activity throughout the liver with suggestion of small liver lesions (likely new compared to chest CT of 07/16/2020). Findings are overall moderately suspicious for hepatic metastasis. Pre and post contrast abdominal MRI (preferred) or CT could confirm. 4. Right-sided pleural thickening and trace pleural fluid. Right base airspace disease is favored to represent chronic atelectasis. 5. Aortic atherosclerosis (ICD10-I70.0) and emphysema (ICD10-J43.9).     10/26/2022 - 04/12/2023 Chemotherapy   Patient is on Treatment Plan : BREAST Paclitaxel  D1,8,15 + Trastuzumab  D1 + Pertuzumab  D1 q21d x 8 cycles / Trastuzumab  D1 + Pertuzumab  D1 q21d x 4 cycles     05/03/2023 -  Chemotherapy   Patient is on Treatment Plan : BREAST MAINTENANCE Trastuzumab  IV (6) or SQ (600) D1 q21d X 11 Cycles        Discussed the use of AI scribe software for clinical note transcription with the patient, who gave verbal consent to proceed.  History of Present Illness Martha [name] is a 63 year old female with metastatic breast cancer who presents for follow-up.  Recent imaging includes a PET scan showing no active lesions and a brain MRI that improved post-radiation therapy. She is on an oral medication regimen without issues and does not require a refill. She experiences tightness in her shoulder muscles and is considering dry needling and massage therapy. Her hair is regrowing and is now darker than before.     All other systems were reviewed with the patient and are negative.  MEDICAL HISTORY:  Past Medical History:  Diagnosis Date   Anxiety    Cancer ([name]) 2022   right breast LCIS   Cancer ([name]) 2019   left breast    Depression    Dysrhythmia    SVT, s/p ablation ~ 2012 in at [facility]   Ectopic pregnancy    Family history of breast cancer    Family history of melanoma    History of radiation therapy 04/22/18-06/03/18   Left Breast, left SCV, axilla 50 Gy in 25 fractions, Left breast boost 10 Gy in 5 fractions.    Hypothyroidism    Personal history of radiation therapy    PONV (postoperative nausea and vomiting)    Thyroid  disease     SURGICAL HISTORY: Past Surgical History:  Procedure Laterality Date   APPENDECTOMY     BILATERAL SALPINGECTOMY     BREAST BIOPSY Right 2014   fibroadenoma   BREAST BIOPSY Left 01/16/2018   BREAST BIOPSY Right 02/02/2020   LCIS   BREAST BIOPSY Right 08/04/2020   x2 LCIS   BREAST BIOPSY Right 08/13/2020   x2   BREAST EXCISIONAL BIOPSY Left 1990   benign   BREAST EXCISIONAL BIOPSY Right 02/02/2020   LCIS   BREAST LUMPECTOMY Left 01/22/2018   BREAST LUMPECTOMY WITH AXILLARY LYMPH NODE BIOPSY Left 02/21/2018   Procedure: LEFT BREAST LUMPECTOMY WITH AXILLARY LYMPH NODE BIOPSY;  Surgeon: [name], MD;  Location: [facility] OR;  Service: General;  Laterality: Left;   BREAST LUMPECTOMY WITH RADIOACTIVE SEED LOCALIZATION Right 03/18/2020   Procedure: RIGHT BREAST LUMPECTOMY WITH RADIOACTIVE SEED LOCALIZATION;  Surgeon: [name], MD;  Location: [facility] OR;  Service: General;  Laterality:  Right;   BREAST SURGERY Left 1993   cyst removed   ENDOMETRIAL ABLATION     EYE SURGERY     GLAUCOMA SURGERY Bilateral    IR IMAGING GUIDED PORT INSERTION  10/16/2022   POSTERIOR CERVICAL FUSION/FORAMINOTOMY N/A 08/17/2022   Procedure: Cervical One-Cervical Four POSTERIOR CERVICAL FUSION,  Cervical One LAMINECTOMY REDUCTION OF Cervical Two Fracture, Biopsy of Right Cerical Two Pars  Lesion;  Surgeon: Debby Dorn MATSU, MD;  Location: [facility] OR;  Service: Neurosurgery;  Laterality: N/A;   POSTERIOR CERVICAL FUSION/FORAMINOTOMY N/A 09/06/2022   Procedure: Posterior Cervical  Fusion, Foraminotomy , Cervical Five-Six, Cervical Six-Seven; Extension of  fusion Cervical Four- Thoracic One;  Surgeon: Debby Dorn MATSU, MD;  Location[facility] OR;  Service: Neurosurgery;  Laterality: N/A;   RADIOACTIVE SEED GUIDED EXCISIONAL BREAST BIOPSY Right 04/28/2021   Procedure: RADIOACTIVE SEED GUIDED EXCISIONAL RIGHT BREAST BIOPSY X2;  Surgeon: [name], MD;  Location: [facility];  Service: General;  Laterality: Right;   RE-EXCISION OF BREAST LUMPECTOMY Left 03/20/2018   Procedure: RE-EXCISION OF BREAST LUMPECTOMY;  Surgeon: [name], MD;  Location: [facility];  Service: General;  Laterality: Left;    I have reviewed the social history and family history with the patient and they are unchanged from previous note.  ALLERGIES:  is allergic to morphine and codeine.  MEDICATIONS:  Current Outpatient Medications  Medication Sig Dispense Refill   acetaminophen  (TYLENOL ) 500 MG tablet Take 2 tablets (1,000 mg total) by mouth every 8 (eight) hours. 30 tablet 0   capecitabine  (XELODA ) 500 MG tablet TAKE 2 TABLETS TWICE DAILY FOR 7 DAYS ON, THEN 7 DAYS OFF (TAKE AFTER MEALS) 56 tablet 2   citalopram  (CELEXA ) 40 MG tablet Take 1 tablet (40 mg total) by mouth daily. 90 tablet 0   diphenoxylate -atropine  (LOMOTIL ) 2.5-0.025 MG tablet Take 1 tablet by mouth 4 (four) times daily as needed for diarrhea or loose stools. 90 tablet 3   levothyroxine  (SYNTHROID ) 75 MCG tablet Take 1 tablet (75 mcg total) by mouth daily before breakfast. 90 tablet 3   loperamide  (IMODIUM  A-D) 2 MG tablet Take 1 tablet (2 mg total) by mouth 4 (four) times daily as needed for diarrhea or loose stools. 90 tablet 0   Mouthwashes (MOUTH RINSE) LIQD solution 15 mLs by Mouth Rinse route as needed (for oral care).  0   ondansetron  (ZOFRAN ) 8 MG tablet Take 1 tablet (8 mg total) by mouth every 8 (eight) hours as needed for nausea or vomiting. 20 tablet 2   OVER THE COUNTER MEDICATION Take 1  tablet by mouth daily. Protandim Supplement     pregabalin  (LYRICA ) 25 MG capsule Take 1 capsule (25 mg total) by mouth 3 (three) times daily. 90 capsule 3   tucatinib  (TUKYSA ) 150 MG tablet Take 2 tablets (300 mg total) by mouth 2 (two) times daily. Take as instructed per MD. 120 tablet 2   No current facility-administered medications for this visit.    PHYSICAL EXAMINATION: ECOG PERFORMANCE STATUS: 2 - Symptomatic, <50% confined to bed  There were no vitals filed for this visit. Wt Readings from Last 3 Encounters:  07/14/24 75 lb 6.4 oz (34.2 kg)  06/26/24 74 lb 8 oz (33.8 kg)  06/05/24 75 lb 9.6 oz (34.3 kg)     GENERAL:alert, no distress and comfortable, thin lady  SKIN: skin color, texture, turgor are normal, no rashes or significant lesions EYES: normal, Conjunctiva are pink and non-injected, sclera clear Musculoskeletal:no cyanosis of  digits and no clubbing  NEURO: alert & oriented x 3 with fluent speech, no focal motor/sensory deficits  Physical Exam    LABORATORY DATA:  I have reviewed the data as listed    Latest Ref Rng & Units 06/26/2024    7:35 AM 06/05/2024   10:37 AM 05/15/2024    8:35 AM  CBC  WBC 4.0 - 10.5 K/uL 5.8  6.8  7.0   Hemoglobin 12.0 - 15.0 g/dL 85.1  85.2  85.6   Hematocrit 36.0 - 46.0 % 41.5  40.7  40.2   Platelets 150 - 400 K/uL 160  153  176         Latest Ref Rng & Units 06/26/2024    7:35 AM 06/05/2024   10:37 AM 05/15/2024    8:35 AM  CMP  Glucose 70 - 99 mg/dL 889  896  99   BUN 8 - 23 mg/dL 17  16  19    Creatinine 0.44 - 1.00 mg/dL 9.46  9.45  9.45   Sodium 135 - 145 mmol/L 137  136  137   Potassium 3.5 - 5.1 mmol/L 4.2  4.5  4.4   Chloride 98 - 111 mmol/L 100  100  101   CO2 22 - 32 mmol/L 30  31  32   Calcium 8.9 - 10.3 mg/dL 9.9  89.8  9.8   Total Protein 6.5 - 8.1 g/dL 7.0  7.0  6.9   Total Bilirubin 0.0 - 1.2 mg/dL 0.7  0.9  0.6   Alkaline Phos 38 - 126 U/L 88  77  82   AST 15 - 41 U/L 18  21  18    ALT 0 - 44 U/L 14  18   21        RADIOGRAPHIC STUDIES: I have personally reviewed the radiological images as listed and agreed with the findings in the report. No results found.    No orders of the defined types were placed in this encounter.  All questions were answered. The patient knows to call the clinic with any problems, questions or concerns. No barriers to learning was detected. The total time spent in the appointment was 25 minutes, including review of chart and various tests results, discussions about plan of care and coordination of care plan     Onita Mattock, MD 07/14/2024

## 2024-07-14 NOTE — Progress Notes (Signed)
 [facility] at [facility] [address]  [place], [place] [address] [phone]   Interval Evaluation  Date of Service: 07/14/24 Patient Name: Martha [name] Patient MRN: [id number] Patient DOB: [birth year] Provider: Arthea MARLA Manns, MD  Identifying Statement:  Martha [name] is a 63 y.o. female with Secondary cancer of brain [facility])    Primary Cancer:  Oncologic History: Oncology History Overview Note  Cancer Staging Malignant neoplasm of lower-inner quadrant of left breast in female, estrogen receptor positive ([name]) Staging form: Breast, AJCC 8th Edition - Clinical stage from 01/16/2018: Stage IA (cT1c, cN0, cM0, G2, ER+, PR+, HER2-) - Signed by [name], MD on 01/23/2018 - Pathologic: Stage IA (pT1c, pN1, cM0, G1, ER+, PR+, HER2-) - Signed by [name], MD on 04/09/2018     Malignant neoplasm of lower-inner quadrant of left breast in female, estrogen receptor positive ([name])  01/15/2018 Mammogram   Diagnositc Mammogram 01/15/18  IMPRESSION: 1. Suspicious 1.2 x 1.4 x 1.3 cm mixed echogenicity mass left breast 7 o'clock position retroareolar location at the site of palpable concern.. 2. Indeterminate Within the left breast 7:30 o'clock retroareolar location, adjacent to the palpable mass, is a 0.5 x 0.4 x 0.5 cm oval circumscribed hypoechoic mass. 3. Indeterminate calcifications within the lateral left breast. Location of these calcifications is not definitely confirmed on the true lateral view.    01/16/2018 Initial Biopsy   Diagnosis 01/16/18 1. Breast, left, needle core biopsy, 7:30 o'clock (ribbon clip) - FIBROCYSTIC CHANGES WITH SCLEROSING ADENOSIS AND CALCIFICATIONS. - FIBROADENOMATOID CHANGE. - NO MALIGNANCY IDENTIFIED. 2. Breast, left, needle core biopsy, 7 o'clock position (coil clip) - INVASIVE MAMMARY CARCINOMA, MSBR GRADE I/II. - SEE MICROSCOPIC DESCRIPTION Microscopic Comment  ADDENDUM: Immunohistochemistry for  E-Cadherin is strongly positive in the tumor consistent with ductal carcinoma. (JDP:ah 01/17/18)   01/16/2018 Receptors her2   Estrogen Receptor: 100%, POSITIVE, STRONG STAINING INTENSITY Progesterone Receptor: 50%, POSITIVE, STRONG STAINING INTENSITY Proliferation Marker Ki67: 20% HER2 Negative   01/16/2018 Cancer Staging   Staging form: Breast, AJCC 8th Edition - Clinical stage from 01/16/2018: Stage IA (cT1c, cN0, cM0, G2, ER+, PR+, HER2-) - Signed by [name], MD on 01/23/2018   01/22/2018 Initial Diagnosis   Malignant neoplasm of lower-inner quadrant of left breast in female, estrogen receptor positive ([name])   02/21/2018 Surgery    LEFT BREAST LUMPECTOMY WITH AXILLARY LYMPH NODE BIOPSY by Dr. Aron  02/21/18   02/21/2018 Pathology Results   Diagnosis 02/21/18 1. Breast, lumpectomy, Left - INVASIVE DUCTAL CARCINOMA, GRADE I, 1.6 CM. - DUCTAL CARCINOMA IN SITU, INTERMEDIATE NUCLEAR GRADE. - ANTERIOR AND MEDIAL RESECTION MARGINS ARE POSITIVE FOR CARCINOMA. - NEGATIVE FOR LYMPHOVASCULAR OR PERINEURAL INVASION. - BACKGROUND BREAST TISSUE WITH FIBROCYSTIC CHANGE, INCLUDING SCLEROSING ADENOSIS. - BIOPSY SITE CHANGES. - SEE ONCOLOGY TABLE. 2. Lymph node, sentinel, biopsy, Left Axillary #1 - METASTATIC BREAST CARCINOMA TO A LYMPH NODE, 1.0 CM IN GREATEST DIMENSION, WITH EXTRANODAL EXTENSION (1/1). 3. Lymph node, sentinel, biopsy, Left Axillary #2 - LYMPH NODE, NEGATIVE FOR CARCINOMA (0/1).    02/21/2018 Miscellaneous   Mammaprint 02/21/18 Low Risk with 10-year risk of recurrnce at 10% -No potential signifcant chemotherapy benefit   03/20/2018 Pathology Results   RE-EXCISION OF BREAST LUMPECTOMY by Dr. Aron  Diagnosis 03/20/18 1. Breast, excision, Left new anterior margin - FIBROCYSTIC CHANGES WITH ADENOSIS AND CALCIFICATIONS. - HEALING BIOPSY SITE. - THERE IS NO EVIDENCE OF MALIGNANCY. 2. Breast, excision, Left new medial margin - FIBROCYSTIC CHANGES WITH ADENOSIS AND  CALCIFICATIONS. - HEALING BIOPSY SITE. - THERE IS NO EVIDENCE OF MALIGNANCY. Microscopic Comment 1. -2. The surgical resection margin(s) of the specimen were inked and microscopically evaluated. (JBK:kh 03-22-18)   04/09/2018 Cancer Staging   Staging form: Breast, AJCC 8th Edition - Pathologic: Stage IA (pT1c, pN1, cM0, G1, ER+, PR+, HER2-) - Signed by [name], MD on 04/09/2018   04/22/2018 - 06/03/2018 Radiation Therapy   Radaiton with Dr. Izell 04/22/18-06/03/18   05/2018 -  Anti-estrogen oral therapy   Letrozole  2.5mg  started 05/2018    Survivorship   Per Mayme Silversmith, NP    05/04/2022 Imaging    IMPRESSION: Cervical spondylosis, as described.   Nonspecific straightening of the expected cervical lordosis.   07/24/2022 Imaging    IMPRESSION: 1. Extensive osseous metastatic disease with pathologic fracture at the base of dens and C2 right lateral mass. Extraosseous tumor at C1 and C2 likely impinging on the right C2 and C3 nerve roots. 2. Degenerative cord impingement at C4-5 to C6-7. Biforaminal impingement at C5-6 and C6-7.   08/03/2022 PET scan    IMPRESSION: 1. Large volume osseous metastasis. 2. Low right cervical and probable right axillary nodal metastasis. 3. Subtle heterogeneous activity throughout the liver with suggestion of small liver lesions (likely new compared to chest CT of 07/16/2020). Findings are overall moderately suspicious for hepatic metastasis. Pre and post contrast abdominal MRI (preferred) or CT could confirm. 4. Right-sided pleural thickening and trace pleural fluid. Right base airspace disease is favored to represent chronic atelectasis. 5. Aortic atherosclerosis (ICD10-I70.0) and emphysema (ICD10-J43.9).     08/11/2022 Genetic Testing   Negative genetic testing on the CancerNext-Expanded+RNAinsight panel.  The report date is August 11, 2022.  The CancerNext-Expanded gene panel offered by [facility] and includes sequencing and  rearrangement analysis for the following 77 genes: AIP, ALK, APC*, ATM*, AXIN2, BAP1, BARD1, BLM, BMPR1A, BRCA1*, BRCA2*, BRIP1*, CDC73, CDH1*, CDK4, CDKN1B, CDKN2A, CHEK2*, CTNNA1, DICER1, FANCC, FH, FLCN, GALNT12, KIF1B, LZTR1, MAX, MEN1, MET, MLH1*, MSH2*, MSH3, MSH6*, MUTYH*, NBN, NF1*, NF2, NTHL1, PALB2*, PHOX2B, PMS2*, POT1, PRKAR1A, PTCH1, PTEN*, RAD51C*, RAD51D*, RB1, RECQL, RET, SDHA, SDHAF2, SDHB, SDHC, SDHD, SMAD4, SMARCA4, SMARCB1, SMARCE1, STK11, SUFU, TMEM127, TP53*, TSC1, TSC2, VHL and XRCC2 (sequencing and deletion/duplication); EGFR, EGLN1, HOXB13, KIT, MITF, PDGFRA, POLD1, and POLE (sequencing only); EPCAM and GREM1 (deletion/duplication only). DNA and RNA analyses performed for * genes.    Lobular carcinoma in situ (LCIS) of right breast  01/20/2020 Mammogram   Diagnostic Mammogram 01/20/20 IMPRESSION: 1.  Stable post lumpectomy changes of the left breast.   2. Suspicious microcalcifications over the right upper outer quadrant spanning 3.6 cm.   02/02/2020 Initial Biopsy   Diagnosis 02/02/20 Breast, right, needle core biopsy, upper outer quadrant, x clip - LOBULAR CARCINOMA IN SITU WITH PLEOMORPHIC FEATURES AND CALCIFICATIONS, INVOLVING ADENOSIS. SEE NOTE Diagnosis Note Immunohistochemical stain for E-cadherin is negative in the lesional cells, consistent with a lobular phenotype. Immunostains for p63, SMM 1 and calponin do not show evidence of invasive carcinoma.    02/04/2020 Initial Diagnosis   Lobular carcinoma in situ (LCIS) of right breast   03/18/2020 Surgery   RIGHT BREAST LUMPECTOMY WITH RADIOACTIVE SEED LOCALIZATION by Dr Marla    03/18/2020 Pathology Results   FINAL MICROSCOPIC DIAGNOSIS:   A. BREAST, RIGHT, LUMPECTOMY:  - Pleomorphic lobular carcinoma in situ with calcifications and  underlying complex sclerosing lesion, adenosis and fibroadenomatoid  change.  - Margins of resection are not involved (Closest margins: < 1 mm,  anterior, posterior,  inferior and  medial).  - Biopsy site.    COMMENT:   P63, Calponin and SMM-1 demonstrate the presence of myoepithelium in the  select focus.    Metastatic breast cancer to bone and brain  05/04/2022 Imaging    IMPRESSION: Cervical spondylosis, as described.   Nonspecific straightening of the expected cervical lordosis.   07/24/2022 Imaging    IMPRESSION: 1. Extensive osseous metastatic disease with pathologic fracture at the base of dens and C2 right lateral mass. Extraosseous tumor at C1 and C2 likely impinging on the right C2 and C3 nerve roots. 2. Degenerative cord impingement at C4-5 to C6-7. Biforaminal impingement at C5-6 and C6-7.   08/02/2022 Initial Diagnosis   Cancer, metastatic to bone ([name])   08/03/2022 PET scan    IMPRESSION: 1. Large volume osseous metastasis. 2. Low right cervical and probable right axillary nodal metastasis. 3. Subtle heterogeneous activity throughout the liver with suggestion of small liver lesions (likely new compared to chest CT of 07/16/2020). Findings are overall moderately suspicious for hepatic metastasis. Pre and post contrast abdominal MRI (preferred) or CT could confirm. 4. Right-sided pleural thickening and trace pleural fluid. Right base airspace disease is favored to represent chronic atelectasis. 5. Aortic atherosclerosis (ICD10-I70.0) and emphysema (ICD10-J43.9).     10/26/2022 - 04/12/2023 Chemotherapy   Patient is on Treatment Plan : BREAST Paclitaxel  D1,8,15 + Trastuzumab  D1 + Pertuzumab  D1 q21d x 8 cycles / Trastuzumab  D1 + Pertuzumab  D1 q21d x 4 cycles     05/03/2023 -  Chemotherapy   Patient is on Treatment Plan : BREAST MAINTENANCE Trastuzumab  IV (6) or SQ (600) D1 q21d X 11 Cycles      CNS Oncologic History 09/20/22: [id number] with Dr. Izell 02/01/24: Widespread progression, completes WBRT Audry)  Interval History: Satin Boal presents today for follow up after recent MRI study, now 5-6 months removed from completion  of whole brain radiation.  No new or progressive neurologic symptoms.  She is doing some walking on her own.  Doing well with Herceptin  and oral treatments for breast cancer with Dr. Lanny.  No headaches or seizures.   H+P (09/04/23) Patient presents today for follow up after recent MRI brain.  She describes no new or progressive neurologic deficits.  Gait is now independent.  Still experiences moderate pain in neck and lower back.  Continues to follow with Dr. Lanny for breast cancer, is taking   Medications: Current Outpatient Medications on File Prior to Visit  Medication Sig Dispense Refill   acetaminophen  (TYLENOL ) 500 MG tablet Take 2 tablets (1,000 mg total) by mouth every 8 (eight) hours. 30 tablet 0   capecitabine  (XELODA ) 500 MG tablet TAKE 2 TABLETS TWICE DAILY FOR 7 DAYS ON, THEN 7 DAYS OFF (TAKE AFTER MEALS) 56 tablet 2   citalopram  (CELEXA ) 40 MG tablet Take 1 tablet (40 mg total) by mouth daily. 90 tablet 0   diphenoxylate -atropine  (LOMOTIL ) 2.5-0.025 MG tablet Take 1 tablet by mouth 4 (four) times daily as needed for diarrhea or loose stools. 90 tablet 3   levothyroxine  (SYNTHROID ) 75 MCG tablet Take 1 tablet (75 mcg total) by mouth daily before breakfast. 90 tablet 3   loperamide  (IMODIUM  A-D) 2 MG tablet Take 1 tablet (2 mg total) by mouth 4 (four) times daily as needed for diarrhea or loose stools. 90 tablet 0   Mouthwashes (MOUTH RINSE) LIQD solution 15 mLs by Mouth Rinse route as needed (for oral care).  0   ondansetron  (ZOFRAN ) 8 MG tablet  Take 1 tablet (8 mg total) by mouth every 8 (eight) hours as needed for nausea or vomiting. 20 tablet 2   OVER THE COUNTER MEDICATION Take 1 tablet by mouth daily. Protandim Supplement     pregabalin  (LYRICA ) 25 MG capsule Take 1 capsule (25 mg total) by mouth 3 (three) times daily. 90 capsule 3   tucatinib  (TUKYSA ) 150 MG tablet Take 2 tablets (300 mg total) by mouth 2 (two) times daily. Take as instructed per MD. 120 tablet 2   No current  facility-administered medications on file prior to visit.    Allergies:  Allergies  Allergen Reactions   Morphine And Codeine Other (See Comments)    Makes her angry   Past Medical History:  Past Medical History:  Diagnosis Date   Anxiety    Cancer ([name]) 2022   right breast LCIS   Cancer ([name]) 2019   left breast   Depression    Dysrhythmia    SVT, s/p ablation ~ 2012 in at [facility]   Ectopic pregnancy    Family history of breast cancer    Family history of melanoma    History of radiation therapy 04/22/18-06/03/18   Left Breast, left SCV, axilla 50 Gy in 25 fractions, Left breast boost 10 Gy in 5 fractions.    Hypothyroidism    Personal history of radiation therapy    PONV (postoperative nausea and vomiting)    Thyroid  disease    Past Surgical History:  Past Surgical History:  Procedure Laterality Date   APPENDECTOMY     BILATERAL SALPINGECTOMY     BREAST BIOPSY Right 2014   fibroadenoma   BREAST BIOPSY Left 01/16/2018   BREAST BIOPSY Right 02/02/2020   LCIS   BREAST BIOPSY Right 08/04/2020   x2 LCIS   BREAST BIOPSY Right 08/13/2020   x2   BREAST EXCISIONAL BIOPSY Left 1990   benign   BREAST EXCISIONAL BIOPSY Right 02/02/2020   LCIS   BREAST LUMPECTOMY Left 01/22/2018   BREAST LUMPECTOMY WITH AXILLARY LYMPH NODE BIOPSY Left 02/21/2018   Procedure: LEFT BREAST LUMPECTOMY WITH AXILLARY LYMPH NODE BIOPSY;  Surgeon: [name], MD;  Location: [facility] OR;  Service: General;  Laterality: Left;   BREAST LUMPECTOMY WITH RADIOACTIVE SEED LOCALIZATION Right 03/18/2020   Procedure: RIGHT BREAST LUMPECTOMY WITH RADIOACTIVE SEED LOCALIZATION;  Surgeon: [name], MD;  Location: [facility] OR;  Service: General;  Laterality: Right;   BREAST SURGERY Left 1993   cyst removed   ENDOMETRIAL ABLATION     EYE SURGERY     GLAUCOMA SURGERY Bilateral    IR IMAGING GUIDED PORT INSERTION  10/16/2022   POSTERIOR CERVICAL FUSION/FORAMINOTOMY N/A 08/17/2022   Procedure:  Cervical One-Cervical Four POSTERIOR CERVICAL FUSION,  Cervical One LAMINECTOMY REDUCTION OF Cervical Two Fracture, Biopsy of Right Cerical Two Pars  Lesion;  Surgeon: Debby Dorn MATSU, MD;  Location: [facility] OR;  Service: Neurosurgery;  Laterality: N/A;   POSTERIOR CERVICAL FUSION/FORAMINOTOMY N/A 09/06/2022   Procedure: Posterior Cervical Fusion, Foraminotomy , Cervical Five-Six, Cervical Six-Seven; Extension of  fusion Cervical Four- Thoracic One;  Surgeon: Debby Dorn MATSU, MD;  Location[facility];  Service: Neurosurgery;  Laterality: N/A;   RADIOACTIVE SEED GUIDED EXCISIONAL BREAST BIOPSY Right 04/28/2021   Procedure: RADIOACTIVE SEED GUIDED EXCISIONAL RIGHT BREAST BIOPSY X2;  Surgeon: [name], MD;  Location: [facility];  Service: General;  Laterality: Right;   RE-EXCISION OF BREAST LUMPECTOMY Left 03/20/2018   Procedure: RE-EXCISION OF BREAST LUMPECTOMY;  Surgeon: Aron,  Jina, MD;  Location: [facility];  Service: General;  Laterality: Left;   Social History:  Social History   Socioeconomic History   Marital status: Married    Spouse name: Not on file   Number of children: 0   Years of education: Not on file   Highest education level: Not on file  Occupational History   Not on file  Tobacco Use   Smoking status: Every Day    Current packs/day: 0.50    Average packs/day: 0.5 packs/day for 40.0 years (20.0 ttl pk-yrs)    Types: Cigarettes   Smokeless tobacco: Never  Vaping Use   Vaping status: Former   Quit date: 09/27/2013  Substance and Sexual Activity   Alcohol use: Not Currently    Alcohol/week: 28.0 standard drinks of alcohol    Types: 28 Cans of beer per week    Comment: drinks 4 beers daily   Drug use: No   Sexual activity: Yes    Birth control/protection: Post-menopausal    Comment: endometrial ablasion  Other Topics Concern   Not on file  Social History Narrative   1 stepdaughter   Social Drivers of Health   Financial Resource  Strain: Not on file  Food Insecurity: No Food Insecurity (02/21/2024)   Hunger Vital Sign    Worried About Running Out of Food in the Last Year: Never true    Ran Out of Food in the Last Year: Never true  Transportation Needs: No Transportation Needs (02/21/2024)   PRAPARE - Administrator, Civil Service (Medical): No    Lack of Transportation (Non-Medical): No  Physical Activity: Not on file  Stress: Not on file  Social Connections: Not on file  Intimate Partner Violence: Not At Risk (02/21/2024)   Humiliation, Afraid, Rape, and Kick questionnaire    Fear of Current or Ex-Partner: No    Emotionally Abused: No    Physically Abused: No    Sexually Abused: No   Family History:  Family History  Problem Relation Age of Onset   Heart disease Mother    Pneumonia Father    Hyperlipidemia Sister    Melanoma Sister 66       hx of 5 melanomas   Lung cancer Maternal Uncle        mesothelioma   Pneumonia Maternal Uncle    Dementia Paternal Grandmother    Lung disease Paternal Grandfather    Breast cancer Other        MGMs sister dx < 50   Breast cancer Other        PGFs sister dx < 50   Breast cancer Cousin        mother's mat first cousin, dx < 50    Review of Systems: Constitutional: Doesn't report fevers, chills or abnormal weight loss Eyes: Doesn't report blurriness of vision Ears, nose, mouth, throat, and face: Doesn't report sore throat Respiratory: Doesn't report cough, dyspnea or wheezes Cardiovascular: Doesn't report palpitation, chest discomfort  Gastrointestinal:  Doesn't report nausea, constipation, diarrhea GU: Doesn't report incontinence Skin: Doesn't report skin rashes Neurological: Per HPI Musculoskeletal: Doesn't report joint pain Behavioral/Psych: Doesn't report anxiety  Physical Exam: Vitals:   07/14/24 1014  BP: (!) 101/59  Pulse: 87  Resp: 16  Temp: 97.6 F (36.4 C)  SpO2: 93%   KPS: 90. General: Alert, cooperative, pleasant, in no  acute distress Head: Normal EENT: No conjunctival injection or scleral icterus.  Lungs: Resp effort normal Cardiac: Regular rate Abdomen:  Non-distended abdomen Skin: No rashes cyanosis or petechiae. Extremities: No clubbing or edema  Neurologic Exam: Mental Status: Awake, alert, attentive to examiner. Oriented to self and environment. Language is fluent with intact comprehension.  Cranial Nerves: Visual acuity is grossly normal. Visual fields are full. Extra-ocular movements intact. No ptosis. Face is symmetric Motor: Tone and bulk are normal. Power is full in both arms and legs. Reflexes are symmetric, no pathologic reflexes present.  Sensory: Intact to light touch Gait: Normal.   Labs: I have reviewed the data as listed    Component Value Date/Time   NA 137 06/26/2024 0735   K 4.2 06/26/2024 0735   CL 100 06/26/2024 0735   CO2 30 06/26/2024 0735   GLUCOSE 110 (H) 06/26/2024 0735   BUN 17 06/26/2024 0735   CREATININE 0.53 06/26/2024 0735   CALCIUM 9.9 06/26/2024 0735   PROT 7.0 06/26/2024 0735   ALBUMIN 4.6 06/26/2024 0735   AST 18 06/26/2024 0735   ALT 14 06/26/2024 0735   ALKPHOS 88 06/26/2024 0735   BILITOT 0.7 06/26/2024 0735   GFRNONAA >60 06/26/2024 0735   GFRAA >60 05/21/2020 1350   Lab Results  Component Value Date   WBC 5.8 06/26/2024   NEUTROABS 4.4 06/26/2024   HGB 14.8 06/26/2024   HCT 41.5 06/26/2024   MCV 106.1 (H) 06/26/2024   PLT 160 06/26/2024    Imaging:  NM PET Image Restag (PS) Skull Base To Thigh Result Date: 07/10/2024 EXAM: PET AND CT SKULL BASE TO MID THIGH 07/10/2024 02:16:30 PM TECHNIQUE: RADIOPHARMACEUTICAL: 5.1 mCi F-18 FDG Uptake time 60 minutes. Glucose level 95 mg/dl. PET imaging was acquired from the base of the skull to the mid thighs. Non-contrast enhanced computed tomography was obtained for attenuation correction and anatomic localization. COMPARISON: FDG PET scan 02/19/2024. CLINICAL HISTORY: Breast cancer, invasive, stage IV,  assess treatment response. FINDINGS: HEAD AND NECK: No hypermetabolic cervical lymph nodes are identified. No suspicious activity identified within the pharyngeal mucosal space. CHEST: There are no hypermetabolic mediastinal, hilar or axillary lymph nodes. No hypermetabolic pulmonary nodules. There bilateral pleural effusions and dense atelectasis in the posterior right lower lobe. This rounded atelectasis measures 7 x 2 cm but does not have metabolic activity. Finding is unchanged from the prior exam. ABDOMEN AND PELVIS: There is no hypermetabolic activity within the liver, adrenal glands, spleen or pancreas. There is no hypermetabolic nodal activity in the abdomen or pelvis. Very little intraabdominal peritoneal fat. Physiologic activity within the gastrointestinal and genitourinary systems. BONES AND SOFT TISSUE: There is no hypermetabolic activity to suggest active osseous metastatic disease. There are diffuse sclerotic skeletal metastases throughout the entirety of the skeleton without adjacent activity. Lucency through the sacrum susceptible to pathologic fracture. No fracture. INCIDENTAL CT FINDINGS: posterior cervical decompression and fusion. IMPRESSION: 1. No evidence of metabolically active breast cancer on FDG PET-CT. 2. Diffuse sclerotic skeletal metastases without metabolic activity. 3. Lucency through the sacrum, potentially susceptible to pathologic fracture, without current fracture. 4. Very little intraabdominal fat. 5. Rounded atelectasis of the right lower lobe, similar to prior, without metabolic activity. Electronically signed by: Norleen Boxer MD 07/10/2024 03:15 PM EST RP Workstation: [id number]   MR BRAIN W WO CONTRAST Result Date: 07/10/2024 EXAM: MRI BRAIN WITH AND WITHOUT CONTRAST 07/10/2024 12:12:13 PM TECHNIQUE: Multiplanar multisequence MRI of the head/brain was performed with and without the administration of intravenous contrast. COMPARISON: MRI of the head dated 04/10/2024.  CLINICAL HISTORY: Breast cancer metastatic to brain. FINDINGS: BRAIN AND VENTRICLES: No acute  infarct. No acute intracranial hemorrhage. No mass effect or midline shift. No hydrocephalus. The sella is unremarkable. Normal flow voids. Interval improvement of metastatic disease to the brain. The miliary nodules previously seen within the basal ganglia bilaterally are much less conspicuous and apparently less numerous. The metastatic disease within the cerebellar hemispheres is also less evident. There is moderate periventricular and deep cerebral white matter disease again demonstrated. ORBITS: No acute abnormality. SINUSES: No acute abnormality. BONES AND SOFT TISSUES: Normal bone marrow signal and enhancement. No acute soft tissue abnormality. The patient is status post posterior decompression and bilateral posterolateral spinal fusion of the upper cervical spine. IMPRESSION: 1. Interval improvement of metastatic disease to the brain, with less conspicuous and less numerous miliary nodules in the basal ganglia bilaterally and less evident metastatic disease within the cerebellar hemispheres. 2. Moderate periventricular and deep cerebral white matter disease. Electronically signed by: Evalene Coho MD 07/10/2024 12:58 PM EST RP Workstation: [id number]    [facility] Clinician Interpretation: I have personally reviewed the radiological images as listed.  My interpretation, in the context of the patient's clinical presentation, is stable disease   Assessment/Plan Secondary cancer of brain ([name])  Crystalann Korf is clinically stable today, now having completed salvage WBRT.  MRI brain demonstrated stable findings overall.  No new or progressive changes.  Recommended continuing imaging surveillance at this time.  We appreciate the opportunity to participate in the care of Chanae Gemma.   We ask that Jazmaine Fuelling return to clinic in 3  months following next brain MRI, or sooner as  needed.  All questions were answered. The patient knows to call the clinic with any problems, questions or concerns. No barriers to learning were detected.  The total time spent in the encounter was 30 minutes and more than 50% was on counseling and review of test results   Arthea MARLA Manns, MD Medical Director of Neuro-Oncology [facility] 07/14/24 10:20 AM

## 2024-07-14 NOTE — Progress Notes (Signed)
 Palliative Medicine [facility]  Telephone:(336) [phone] [phone]) [phone]   Name: Martha [name] Date: 07/14/2024 MRN: [id number]  DOB: 11-01-1960  Patient Care Team: [name], MD as PCP - General (Family Medicine) [name], MD as Consulting Physician (General Surgery) [name], MD as Consulting Physician (Hematology) [name], MD as Attending Physician (Radiation Oncology) [name], [name], NP as Nurse Practitioner (Nurse Practitioner)   INTERVAL HISTORY: Martha [name] is a 63 y.o. female with oncologic medical history including ER+ breast cancer (01/2018), right breast cancer (04/2021), now with metastatic disease progression involving brain and liver, pathological fractures with osseous mets s/p brain radiation. .  Palliative ask to see for symptom management and goals of care.   SOCIAL HISTORY:     reports that she has been smoking cigarettes. She has a 20 pack-year smoking history. She has never used smokeless tobacco. She reports that she does not currently use alcohol after a past usage of about 28.0 standard drinks of alcohol per week. She reports that she does not use drugs.  ADVANCE DIRECTIVES:  On file   CODE STATUS: DNR  PAST MEDICAL HISTORY: Past Medical History:  Diagnosis Date   Anxiety    Cancer ([name]) 2022   right breast LCIS   Cancer ([name]) 2019   left breast   Depression    Dysrhythmia    SVT, s/p ablation ~ 2012 in at [facility]   Ectopic pregnancy    Family history of breast cancer    Family history of melanoma    History of radiation therapy 04/22/18-06/03/18   Left Breast, left SCV, axilla 50 Gy in 25 fractions, Left breast boost 10 Gy in 5 fractions.    Hypothyroidism    Personal history of radiation therapy    PONV (postoperative nausea and vomiting)    Thyroid  disease     ALLERGIES:  is allergic to morphine and codeine.  MEDICATIONS:  Current Outpatient Medications  Medication  Sig Dispense Refill   acetaminophen  (TYLENOL ) 500 MG tablet Take 2 tablets (1,000 mg total) by mouth every 8 (eight) hours. 30 tablet 0   capecitabine  (XELODA ) 500 MG tablet TAKE 2 TABLETS TWICE DAILY FOR 7 DAYS ON, THEN 7 DAYS OFF (TAKE AFTER MEALS) 56 tablet 2   citalopram  (CELEXA ) 40 MG tablet Take 1 tablet (40 mg total) by mouth daily. 90 tablet 0   diphenoxylate -atropine  (LOMOTIL ) 2.5-0.025 MG tablet Take 1 tablet by mouth 4 (four) times daily as needed for diarrhea or loose stools. 90 tablet 3   levothyroxine  (SYNTHROID ) 75 MCG tablet Take 1 tablet (75 mcg total) by mouth daily before breakfast. 90 tablet 3   loperamide  (IMODIUM  A-D) 2 MG tablet Take 1 tablet (2 mg total) by mouth 4 (four) times daily as needed for diarrhea or loose stools. 90 tablet 0   Mouthwashes (MOUTH RINSE) LIQD solution 15 mLs by Mouth Rinse route as needed (for oral care).  0   ondansetron  (ZOFRAN ) 8 MG tablet Take 1 tablet (8 mg total) by mouth every 8 (eight) hours as needed for nausea or vomiting. 20 tablet 2   OVER THE COUNTER MEDICATION Take 1 tablet by mouth daily. Protandim Supplement     pregabalin  (LYRICA ) 25 MG capsule Take 1 capsule (25 mg total) by mouth 3 (three) times daily. 90 capsule 3   tucatinib  (TUKYSA ) 150 MG tablet Take 2 tablets (300 mg total) by mouth 2 (two) times daily. Take as instructed per MD.  120 tablet 2   No current facility-administered medications for this visit.    VITAL SIGNS: There were no vitals taken for this visit. There were no vitals filed for this visit.   Estimated body mass index is 13.79 kg/m as calculated from the following:   Height as of an earlier encounter on 07/14/24: 5' 2 (1.575 m).   Weight as of an earlier encounter on 07/14/24: 75 lb 6.4 oz (34.2 kg).   PERFORMANCE STATUS (ECOG) : 1 - Symptomatic but completely ambulatory   Physical Exam General: NAD Cardiovascular: regular rate and rhythm Pulmonary: normal breathing pattern Extremities: no edema,  no joint deformities Skin: no rashes Neurological: AAO x3  IMPRESSION: Discussed the use of AI scribe software for clinical note transcription with the patient, who gave verbal consent to proceed.  History of Present Illness Martha [name] is a 63 year old female with metastatic cancer who presented to clinic for follow-up. No acute distress noted. She is doing well overall. Accompanied by her husband. She denies concerns of uncontrolled nausea, vomiting, constipation, or diarrhea.   [name] shares her appetite is doing well. She continues to consume two nutritional drinks daily, each providing 530 calories, and supplements this with ice cream to maintain her energy levels.   Pain and neuropathic symptoms are well controlled on Pregabalin . No adjustments to regimen at this time.   She experiences significant tightness in her neck muscles, which she attributes to compensatory mechanisms for holding her head up. She previously underwent ten sessions of dry needling before her cancer diagnosis, which she found beneficial. She is considering resuming this treatment to alleviate her symptoms if approved by her Oncology providers.   I discussed the importance of continued conversation with family and their medical providers regarding overall plan of care and treatment options, ensuring decisions are within the context of the patients values and GOCs. Assessment & Plan  Neuropathy Pregabalin  dosing tolerated. Discomfort much improved. - Continue pregabalin  dosage to one capsule (25mg ) three times a day.  Quality of Life QOL much improved. Decreased symptom burden.   Muscle tightness in neck Chronic muscle tightness in her neck, likely due to prolonged positioning and muscle strain. Previous dry needling sessions were beneficial. - Requested dry needling sessions if approved by her Oncologist. - Consider massage therapy for muscle relaxation.  Nausea, resolved Intermittent nausea,  currently resolved with anti-nausea medication as needed. - Continue anti-nausea medication as needed.  Nutritional supplementation Ongoing nutritional supplementation, consuming two servings daily with a minimum of 530 calories each. No restrictions on intake. - Continue current nutritional supplementation regimen.  I will see patient back in 3-4 weeks. Sooner if needed.   Patient expressed understanding and was in agreement with this plan. She also understands that She can call the clinic at any time with any questions, concerns, or complaints.   Any controlled substances utilized were prescribed in the context of palliative care. PDMP has been reviewed.   Visit consisted of counseling and education dealing with the complex and emotionally intense issues of symptom management and palliative care in the setting of serious and potentially life-threatening illness.  Levon Borer, AGPCNP-BC  Palliative Medicine Team/[facility]

## 2024-07-15 ENCOUNTER — Telehealth: Payer: Self-pay | Admitting: Internal Medicine

## 2024-07-15 NOTE — Telephone Encounter (Signed)
 Scheduled patient for next appointment. Called and spoke with the patient, she is aware.

## 2024-07-17 ENCOUNTER — Inpatient Hospital Stay

## 2024-07-17 ENCOUNTER — Encounter: Payer: Self-pay | Admitting: Hematology

## 2024-07-17 VITALS — BP 111/46 | HR 86 | Temp 98.2°F | Resp 16 | Wt 74.5 lb

## 2024-07-17 DIAGNOSIS — C7951 Secondary malignant neoplasm of bone: Secondary | ICD-10-CM

## 2024-07-17 DIAGNOSIS — C50312 Malignant neoplasm of lower-inner quadrant of left female breast: Secondary | ICD-10-CM | POA: Diagnosis not present

## 2024-07-17 LAB — CBC WITH DIFFERENTIAL (CANCER CENTER ONLY)
Abs Immature Granulocytes: 0.02 K/uL (ref 0.00–0.07)
Basophils Absolute: 0 K/uL (ref 0.0–0.1)
Basophils Relative: 1 %
Eosinophils Absolute: 0.1 K/uL (ref 0.0–0.5)
Eosinophils Relative: 1 %
HCT: 40.2 % (ref 36.0–46.0)
Hemoglobin: 14.4 g/dL (ref 12.0–15.0)
Immature Granulocytes: 0 %
Lymphocytes Relative: 13 %
Lymphs Abs: 0.7 K/uL (ref 0.7–4.0)
MCH: 38.1 pg — ABNORMAL HIGH (ref 26.0–34.0)
MCHC: 35.8 g/dL (ref 30.0–36.0)
MCV: 106.3 fL — ABNORMAL HIGH (ref 80.0–100.0)
Monocytes Absolute: 0.5 K/uL (ref 0.1–1.0)
Monocytes Relative: 9 %
Neutro Abs: 4 K/uL (ref 1.7–7.7)
Neutrophils Relative %: 76 %
Platelet Count: 120 K/uL — ABNORMAL LOW (ref 150–400)
RBC: 3.78 MIL/uL — ABNORMAL LOW (ref 3.87–5.11)
RDW: 14.7 % (ref 11.5–15.5)
WBC Count: 5.2 K/uL (ref 4.0–10.5)
nRBC: 0 % (ref 0.0–0.2)

## 2024-07-17 LAB — CMP (CANCER CENTER ONLY)
ALT: 21 U/L (ref 0–44)
AST: 28 U/L (ref 15–41)
Albumin: 4.5 g/dL (ref 3.5–5.0)
Alkaline Phosphatase: 87 U/L (ref 38–126)
Anion gap: 9 (ref 5–15)
BUN: 13 mg/dL (ref 8–23)
CO2: 29 mmol/L (ref 22–32)
Calcium: 9.9 mg/dL (ref 8.9–10.3)
Chloride: 97 mmol/L — ABNORMAL LOW (ref 98–111)
Creatinine: 0.49 mg/dL (ref 0.44–1.00)
GFR, Estimated: >60 mL/min (ref >60)
Glucose, Bld: 99 mg/dL (ref 70–99)
Potassium: 3.9 mmol/L (ref 3.5–5.1)
Sodium: 135 mmol/L (ref 135–145)
Total Bilirubin: 1.3 mg/dL — ABNORMAL HIGH (ref 0.0–1.2)
Total Protein: 6.6 g/dL (ref 6.5–8.1)

## 2024-07-17 MED ORDER — TRASTUZUMAB-ANNS CHEMO 150 MG IV SOLR
6.0000 mg/kg | Freq: Once | INTRAVENOUS | Status: AC
  Administered 2024-07-17: 210 mg via INTRAVENOUS
  Filled 2024-07-17: qty 10

## 2024-07-17 MED ORDER — ACETAMINOPHEN 325 MG PO TABS
650.0000 mg | ORAL_TABLET | Freq: Once | ORAL | Status: AC
  Administered 2024-07-17: 650 mg via ORAL
  Filled 2024-07-17: qty 2

## 2024-07-17 MED ORDER — SODIUM CHLORIDE 0.9 % IV SOLN: Freq: Once | INTRAVENOUS | Status: AC

## 2024-07-17 NOTE — Patient Instructions (Signed)
 [facility] - A DEPT OF [facility]  Discharge Instructions: Thank you for choosing [facility] to provide your oncology and hematology care.   If you have a lab appointment with the Cancer Center, please go directly to the Cancer Center and check in at the registration area.   Wear comfortable clothing and clothing appropriate for easy access to any Portacath or PICC line.   We strive to give you quality time with your provider. You may need to reschedule your appointment if you arrive late (15 or more minutes).  Arriving late affects you and other patients whose appointments are after yours.  Also, if you miss three or more appointments without notifying the office, you may be dismissed from the clinic at the provider's discretion.      For prescription refill requests, have your pharmacy contact our office and allow 72 hours for refills to be completed.    Today you received the following chemotherapy and/or immunotherapy agents kanjinti      To help prevent nausea and vomiting after your treatment, we encourage you to take your nausea medication as directed.  BELOW ARE SYMPTOMS THAT SHOULD BE REPORTED IMMEDIATELY: *FEVER GREATER THAN 100.4 F (38 C) OR HIGHER *CHILLS OR SWEATING *NAUSEA AND VOMITING THAT IS NOT CONTROLLED WITH YOUR NAUSEA MEDICATION *UNUSUAL SHORTNESS OF BREATH *UNUSUAL BRUISING OR BLEEDING *URINARY PROBLEMS (pain or burning when urinating, or frequent urination) *BOWEL PROBLEMS (unusual diarrhea, constipation, pain near the anus) TENDERNESS IN MOUTH AND THROAT WITH OR WITHOUT PRESENCE OF ULCERS (sore throat, sores in mouth, or a toothache) UNUSUAL RASH, SWELLING OR PAIN  UNUSUAL VAGINAL DISCHARGE OR ITCHING   Items with * indicate a potential emergency and should be followed up as soon as possible or go to the Emergency Department if any problems should occur.  Please show the CHEMOTHERAPY ALERT CARD or IMMUNOTHERAPY  ALERT CARD at check-in to the Emergency Department and triage nurse.  Should you have questions after your visit or need to cancel or reschedule your appointment, please contact [facility]  Dept: [phone]  and follow the prompts.  Office hours are 8:00 a.m. to 4:30 p.m. Monday - Friday. Please note that voicemails left after 4:00 p.m. may not be returned until the following business day.  We are closed weekends and major holidays. You have access to a nurse at all times for urgent questions. Please call the main number to the clinic Dept: [phone] and follow the prompts.   For any non-urgent questions, you may also contact your provider using MyChart. We now offer e-Visits for anyone 81 and older to request care online for non-urgent symptoms. For details visit [email or web address].   Also download the MyChart app! Go to the app store, search "MyChart", open the app, select [facility], and log in with your MyChart username and password.

## 2024-07-18 ENCOUNTER — Other Ambulatory Visit: Payer: Self-pay | Admitting: Nurse Practitioner

## 2024-07-18 DIAGNOSIS — C7951 Secondary malignant neoplasm of bone: Secondary | ICD-10-CM

## 2024-07-18 DIAGNOSIS — C50312 Malignant neoplasm of lower-inner quadrant of left female breast: Secondary | ICD-10-CM

## 2024-08-07 ENCOUNTER — Inpatient Hospital Stay (HOSPITAL_BASED_OUTPATIENT_CLINIC_OR_DEPARTMENT_OTHER): Admitting: Nurse Practitioner

## 2024-08-07 ENCOUNTER — Inpatient Hospital Stay

## 2024-08-07 ENCOUNTER — Encounter: Payer: Self-pay | Admitting: Nurse Practitioner

## 2024-08-07 ENCOUNTER — Inpatient Hospital Stay: Attending: Genetic Counselor

## 2024-08-07 VITALS — BP 123/68 | HR 77 | Temp 99.0°F | Resp 19 | Ht 62.0 in | Wt 74.6 lb

## 2024-08-07 DIAGNOSIS — C7951 Secondary malignant neoplasm of bone: Secondary | ICD-10-CM

## 2024-08-07 DIAGNOSIS — Z170 Estrogen receptor positive status [ER+]: Secondary | ICD-10-CM

## 2024-08-07 DIAGNOSIS — Z5112 Encounter for antineoplastic immunotherapy: Secondary | ICD-10-CM | POA: Diagnosis present

## 2024-08-07 DIAGNOSIS — C799 Secondary malignant neoplasm of unspecified site: Secondary | ICD-10-CM

## 2024-08-07 DIAGNOSIS — Z923 Personal history of irradiation: Secondary | ICD-10-CM | POA: Insufficient documentation

## 2024-08-07 DIAGNOSIS — M792 Neuralgia and neuritis, unspecified: Secondary | ICD-10-CM

## 2024-08-07 DIAGNOSIS — Z79899 Other long term (current) drug therapy: Secondary | ICD-10-CM | POA: Insufficient documentation

## 2024-08-07 DIAGNOSIS — Z171 Estrogen receptor negative status [ER-]: Secondary | ICD-10-CM | POA: Diagnosis not present

## 2024-08-07 DIAGNOSIS — G893 Neoplasm related pain (acute) (chronic): Secondary | ICD-10-CM

## 2024-08-07 DIAGNOSIS — C50312 Malignant neoplasm of lower-inner quadrant of left female breast: Secondary | ICD-10-CM | POA: Insufficient documentation

## 2024-08-07 DIAGNOSIS — C787 Secondary malignant neoplasm of liver and intrahepatic bile duct: Secondary | ICD-10-CM | POA: Insufficient documentation

## 2024-08-07 DIAGNOSIS — Z515 Encounter for palliative care: Secondary | ICD-10-CM

## 2024-08-07 DIAGNOSIS — G629 Polyneuropathy, unspecified: Secondary | ICD-10-CM

## 2024-08-07 DIAGNOSIS — C7931 Secondary malignant neoplasm of brain: Secondary | ICD-10-CM | POA: Diagnosis not present

## 2024-08-07 LAB — CMP (CANCER CENTER ONLY)
ALT: 13 U/L (ref 0–44)
AST: 23 U/L (ref 15–41)
Albumin: 4.6 g/dL (ref 3.5–5.0)
Alkaline Phosphatase: 84 U/L (ref 38–126)
Anion gap: 9 (ref 5–15)
BUN: 16 mg/dL (ref 8–23)
CO2: 29 mmol/L (ref 22–32)
Calcium: 9.6 mg/dL (ref 8.9–10.3)
Chloride: 100 mmol/L (ref 98–111)
Creatinine: 0.54 mg/dL (ref 0.44–1.00)
GFR, Estimated: >60 mL/min (ref >60)
Glucose, Bld: 103 mg/dL — ABNORMAL HIGH (ref 70–99)
Potassium: 4.2 mmol/L (ref 3.5–5.1)
Sodium: 138 mmol/L (ref 135–145)
Total Bilirubin: 0.6 mg/dL (ref 0.0–1.2)
Total Protein: 6.8 g/dL (ref 6.5–8.1)

## 2024-08-07 LAB — CBC WITH DIFFERENTIAL (CANCER CENTER ONLY)
Abs Immature Granulocytes: 0.04 K/uL (ref 0.00–0.07)
Basophils Absolute: 0 K/uL (ref 0.0–0.1)
Basophils Relative: 1 %
Eosinophils Absolute: 0.1 K/uL (ref 0.0–0.5)
Eosinophils Relative: 1 %
HCT: 42.2 % (ref 36.0–46.0)
Hemoglobin: 14.6 g/dL (ref 12.0–15.0)
Immature Granulocytes: 1 %
Lymphocytes Relative: 16 %
Lymphs Abs: 0.9 K/uL (ref 0.7–4.0)
MCH: 36.5 pg — ABNORMAL HIGH (ref 26.0–34.0)
MCHC: 34.6 g/dL (ref 30.0–36.0)
MCV: 105.5 fL — ABNORMAL HIGH (ref 80.0–100.0)
Monocytes Absolute: 0.5 K/uL (ref 0.1–1.0)
Monocytes Relative: 9 %
Neutro Abs: 4.5 K/uL (ref 1.7–7.7)
Neutrophils Relative %: 72 %
Platelet Count: 148 K/uL — ABNORMAL LOW (ref 150–400)
RBC: 4 MIL/uL (ref 3.87–5.11)
RDW: 14.8 % (ref 11.5–15.5)
WBC Count: 6.1 K/uL (ref 4.0–10.5)
nRBC: 0 % (ref 0.0–0.2)

## 2024-08-07 MED ORDER — SODIUM CHLORIDE 0.9 % IV SOLN: Freq: Once | INTRAVENOUS | Status: AC

## 2024-08-07 MED ORDER — SODIUM CHLORIDE 0.9% FLUSH
10.0000 mL | INTRAVENOUS | Status: DC | PRN
  Administered 2024-08-07: 10 mL

## 2024-08-07 MED ORDER — TRASTUZUMAB-ANNS CHEMO 150 MG IV SOLR
6.0000 mg/kg | Freq: Once | INTRAVENOUS | Status: AC
  Administered 2024-08-07: 210 mg via INTRAVENOUS
  Filled 2024-08-07: qty 10

## 2024-08-07 MED ORDER — ACETAMINOPHEN 325 MG PO TABS
650.0000 mg | ORAL_TABLET | Freq: Once | ORAL | Status: AC
  Administered 2024-08-07: 650 mg via ORAL
  Filled 2024-08-07: qty 2

## 2024-08-07 NOTE — Progress Notes (Signed)
 Palliative Medicine [facility]  Telephone:(336) [phone] Fax:(336) [phone]   Name: Martha [name] Date: 08/07/2024 MRN: [id number]  DOB: Jan 25, 1961  Patient Care Team: [name], MD as PCP - General (Family Medicine) [name], MD as Consulting Physician (General Surgery) [name], MD as Consulting Physician (Hematology) [name], MD as Attending Physician (Radiation Oncology) [name], [name], NP as Nurse Practitioner (Nurse Practitioner)   INTERVAL HISTORY: Martha [name] is a 63 y.o. female with oncologic medical history including ER+ breast cancer (01/2018), right breast cancer (04/2021), now with metastatic disease progression involving brain and liver, pathological fractures with osseous mets s/p brain radiation. .  Palliative ask to see for symptom management and goals of care.   SOCIAL HISTORY:     reports that she has been smoking cigarettes. She has a 20 pack-year smoking history. She has never used smokeless tobacco. She reports that she does not currently use alcohol after a past usage of about 28.0 standard drinks of alcohol per week. She reports that she does not use drugs.  ADVANCE DIRECTIVES:  On file   CODE STATUS: DNR  PAST MEDICAL HISTORY: Past Medical History:  Diagnosis Date   Anxiety    Cancer ([name]) 2022   right breast LCIS   Cancer ([name]) 2019   left breast   Depression    Dysrhythmia    SVT, s/p ablation ~ 2012 in at [facility]   Ectopic pregnancy    Family history of breast cancer    Family history of melanoma    History of radiation therapy 04/22/18-06/03/18   Left Breast, left SCV, axilla 50 Gy in 25 fractions, Left breast boost 10 Gy in 5 fractions.    Hypothyroidism    Personal history of radiation therapy    PONV (postoperative nausea and vomiting)    Thyroid  disease     ALLERGIES:  is allergic to morphine and codeine.  MEDICATIONS:  Current Outpatient Medications  Medication  Sig Dispense Refill   acetaminophen  (TYLENOL ) 500 MG tablet Take 2 tablets (1,000 mg total) by mouth every 8 (eight) hours. 30 tablet 0   capecitabine  (XELODA ) 500 MG tablet TAKE 2 TABLETS TWICE DAILY FOR 7 DAYS ON, THEN 7 DAYS OFF (TAKE AFTER MEALS) 56 tablet 2   citalopram  (CELEXA ) 40 MG tablet Take 1 tablet (40 mg total) by mouth daily. 90 tablet 0   diphenoxylate -atropine  (LOMOTIL ) 2.5-0.025 MG tablet Take 1 tablet by mouth 4 (four) times daily as needed for diarrhea or loose stools. 90 tablet 3   levothyroxine  (SYNTHROID ) 75 MCG tablet Take 1 tablet (75 mcg total) by mouth daily before breakfast. 90 tablet 3   loperamide  (IMODIUM  A-D) 2 MG tablet Take 1 tablet (2 mg total) by mouth 4 (four) times daily as needed for diarrhea or loose stools. 90 tablet 0   Mouthwashes (MOUTH RINSE) LIQD solution 15 mLs by Mouth Rinse route as needed (for oral care).  0   ondansetron  (ZOFRAN ) 8 MG tablet Take 1 tablet (8 mg total) by mouth every 8 (eight) hours as needed for nausea or vomiting. 20 tablet 2   OVER THE COUNTER MEDICATION Take 1 tablet by mouth daily. Protandim Supplement     pregabalin  (LYRICA ) 25 MG capsule Take 1 capsule (25 mg total) by mouth 3 (three) times daily. 90 capsule 3   tucatinib  (TUKYSA ) 150 MG tablet Take 2 tablets (300 mg total) by mouth 2 (two) times daily. Take as instructed per MD.  120 tablet 2   No current facility-administered medications for this visit.   Facility-Administered Medications Ordered in Other Visits  Medication Dose Route Frequency Provider Last Rate Last Admin   sodium chloride  flush (NS) 0.9 % injection 10 mL  10 mL Intracatheter PRN [name], MD   10 mL at 08/07/24 1423    VITAL SIGNS: BP 123/68 (BP Location: Left Arm, Patient Position: Sitting)   Pulse 77   Temp 99 F (37.2 C) (Temporal)   Resp 19   Ht 5' 2 (1.575 m)   Wt 74 lb 9.6 oz (33.8 kg)   SpO2 97%   BMI 13.64 kg/m  Filed Weights   08/07/24 1116  Weight: 74 lb 9.6 oz (33.8 kg)      Estimated body mass index is 13.64 kg/m as calculated from the following:   Height as of this encounter: 5' 2 (1.575 m).   Weight as of this encounter: 74 lb 9.6 oz (33.8 kg).   PERFORMANCE STATUS (ECOG) : 1 - Symptomatic but completely ambulatory   Physical Exam General: NAD Cardiovascular: regular rate and rhythm Pulmonary: normal breathing pattern Extremities: no edema, no joint deformities Skin: no rashes Neurological: AAO x3  IMPRESSION: Discussed the use of AI scribe software for clinical note transcription with the patient, who gave verbal consent to proceed.  History of Present Illness Martha [name] is a 63 year old female with metastatic cancer who presented to clinic for follow-up. No acute distress noted. She is doing well overall. Accompanied by her husband. She denies concerns of uncontrolled nausea, vomiting, constipation, or diarrhea.   Martha [name] shares her appetite is doing well. She continues to consume two nutritional drinks daily. Weight is stable at 74lbs.   Pain and neuropathic symptoms are well controlled on Pregabalin . No adjustments to regimen at this time.   She describes injuries to both thumbs, which are puffy and have been splitting open. She attributes these injuries to handling boxes, which she believes has dried out her fingers. She has been applying Neosporin and Band-Aids to the affected areas, but they tend to come off.  Discussed keeping area clean and dry. Use of neosporin as needed.   No uncontrolled symptoms. We will continue to closely follow and support.   Assessment & Plan  Neuropathy Pregabalin  dosing tolerated. Discomfort much improved. - Continue pregabalin  dosage to one capsule (25mg ) three times a day.  Quality of Life QOL much improved. Decreased symptom burden.   Muscle tightness in neck Chronic muscle tightness in her neck, likely due to prolonged positioning and muscle strain. Previous dry needling sessions were  beneficial. - Requested dry needling sessions if approved by her Oncologist. - Consider massage therapy for muscle relaxation.  Nausea, resolved Intermittent nausea, currently resolved with anti-nausea medication as needed. - Continue anti-nausea medication as needed.  Nutritional supplementation Ongoing nutritional supplementation, consuming two servings daily with a minimum of 530 calories each. No restrictions on intake. - Continue current nutritional supplementation regimen.  Box cut  of bilateral thumbs Cut to bilateral thumbs, with one thumb more affected, presenting with puffiness and splitting open. Likely due to exposure to cardboard, causing dryness and irritation. Risk of infection if not properly managed. - Continue applying Neosporin and covering with a Band-Aid or Steri-Strips. - Keep the affected thumb covered as much as possible to prevent infection. - Consider using a circle Band-Aid if long Band-Aids are not suitable.  I will see patient back in 3-4 weeks. Sooner if needed.  Patient expressed understanding and was in agreement with this plan. She also understands that She can call the clinic at any time with any questions, concerns, or complaints.   Any controlled substances utilized were prescribed in the context of palliative care. PDMP has been reviewed.   Visit consisted of counseling and education dealing with the complex and emotionally intense issues of symptom management and palliative care in the setting of serious and potentially life-threatening illness.  Levon Borer, AGPCNP-BC  Palliative Medicine Team/[facility]

## 2024-08-07 NOTE — Progress Notes (Signed)
 Per Dr. Lanny- okay to tx with previous echo from 04/25/2024 (60-65%). Anders Scurry, RN ordering another echo to be done.

## 2024-08-07 NOTE — Patient Instructions (Signed)
 [facility] - A DEPT OF [facility]  Discharge Instructions: Thank you for choosing [facility] to provide your oncology and hematology care.   If you have a lab appointment with the Cancer Center, please go directly to the Cancer Center and check in at the registration area.   Wear comfortable clothing and clothing appropriate for easy access to any Portacath or PICC line.   We strive to give you quality time with your provider. You may need to reschedule your appointment if you arrive late (15 or more minutes).  Arriving late affects you and other patients whose appointments are after yours.  Also, if you miss three or more appointments without notifying the office, you may be dismissed from the clinic at the provider's discretion.      For prescription refill requests, have your pharmacy contact our office and allow 72 hours for refills to be completed.    Today you received the following chemotherapy and/or immunotherapy agents: Trastuzumab -anns (Kanjinti )    To help prevent nausea and vomiting after your treatment, we encourage you to take your nausea medication as directed.  BELOW ARE SYMPTOMS THAT SHOULD BE REPORTED IMMEDIATELY: *FEVER GREATER THAN 100.4 F (38 C) OR HIGHER *CHILLS OR SWEATING *NAUSEA AND VOMITING THAT IS NOT CONTROLLED WITH YOUR NAUSEA MEDICATION *UNUSUAL SHORTNESS OF BREATH *UNUSUAL BRUISING OR BLEEDING *URINARY PROBLEMS (pain or burning when urinating, or frequent urination) *BOWEL PROBLEMS (unusual diarrhea, constipation, pain near the anus) TENDERNESS IN MOUTH AND THROAT WITH OR WITHOUT PRESENCE OF ULCERS (sore throat, sores in mouth, or a toothache) UNUSUAL RASH, SWELLING OR PAIN  UNUSUAL VAGINAL DISCHARGE OR ITCHING   Items with * indicate a potential emergency and should be followed up as soon as possible or go to the Emergency Department if any problems should occur.  Please show the CHEMOTHERAPY ALERT CARD  or IMMUNOTHERAPY ALERT CARD at check-in to the Emergency Department and triage nurse.  Should you have questions after your visit or need to cancel or reschedule your appointment, please contact [facility] - A DEPT OF [facility]  Dept: [phone]  and follow the prompts.  Office hours are 8:00 a.m. to 4:30 p.m. Monday - Friday. Please note that voicemails left after 4:00 p.m. may not be returned until the following business day.  We are closed weekends and major holidays. You have access to a nurse at all times for urgent questions. Please call the main number to the clinic Dept: [phone] and follow the prompts.   For any non-urgent questions, you may also contact your provider using MyChart. We now offer e-Visits for anyone 57 and older to request care online for non-urgent symptoms. For details visit [email or web address].   Also download the MyChart app! Go to the app store, search MyChart, open the app, select [facility], and log in with your MyChart username and password.

## 2024-08-12 ENCOUNTER — Other Ambulatory Visit: Payer: Self-pay | Admitting: Hematology

## 2024-08-12 DIAGNOSIS — C7951 Secondary malignant neoplasm of bone: Secondary | ICD-10-CM

## 2024-08-24 NOTE — Progress Notes (Unsigned)
 "     [name]   [phone] Fax:(336) [phone]    Patient Care Team: [name], MD as PCP - General (Family Medicine) [name], MD as Consulting Physician (General Surgery) [name], MD as Consulting Physician (Hematology) [name], MD as Attending Physician (Radiation Oncology) Amirah Goerke K, NP as Nurse Practitioner (Nurse Practitioner)   CHIEF COMPLAINT: Follow-up metastatic breast cancer  Oncology History Overview Note  Cancer Staging Malignant neoplasm of lower-inner quadrant of left breast in female, estrogen receptor positive [name]) Staging form: Breast, AJCC 8th Edition - Clinical stage from 01/16/2018: Stage IA (cT1c, cN0, cM0, G2, ER+, PR+, HER2-) - Signed by [name], MD on 01/23/2018 - Pathologic: Stage IA (pT1c, pN1, cM0, G1, ER+, PR+, HER2-) - Signed by [name], MD on 04/09/2018     Malignant neoplasm of lower-inner quadrant of left breast in female, estrogen receptor positive ([name])  01/15/2018 Mammogram   Diagnositc Mammogram 01/15/18  IMPRESSION: 1. Suspicious 1.2 x 1.4 x 1.3 cm mixed echogenicity mass left breast 7 o'clock position retroareolar location at the site of palpable concern.. 2. Indeterminate Within the left breast 7:30 o'clock retroareolar location, adjacent to the palpable mass, is a 0.5 x 0.4 x 0.5 cm oval circumscribed hypoechoic mass. 3. Indeterminate calcifications within the lateral left breast. Location of these calcifications is not definitely confirmed on the true lateral view.    01/16/2018 Initial Biopsy   Diagnosis 01/16/18 1. Breast, left, needle core biopsy, 7:30 o'clock (ribbon clip) - FIBROCYSTIC CHANGES WITH SCLEROSING ADENOSIS AND CALCIFICATIONS. - FIBROADENOMATOID CHANGE. - NO MALIGNANCY IDENTIFIED. 2. Breast, left, needle core biopsy, 7 o'clock position (coil clip) - INVASIVE MAMMARY CARCINOMA, MSBR GRADE I/II. - SEE MICROSCOPIC DESCRIPTION Microscopic Comment  ADDENDUM:  Immunohistochemistry for E-Cadherin is strongly positive in the tumor consistent with ductal carcinoma. (JDP:ah 01/17/18)   01/16/2018 Receptors her2   Estrogen Receptor: 100%, POSITIVE, STRONG STAINING INTENSITY Progesterone Receptor: 50%, POSITIVE, STRONG STAINING INTENSITY Proliferation Marker Ki67: 20% HER2 Negative   01/16/2018 Cancer Staging   Staging form: Breast, AJCC 8th Edition - Clinical stage from 01/16/2018: Stage IA (cT1c, cN0, cM0, G2, ER+, PR+, HER2-) - Signed by [name], MD on 01/23/2018   01/22/2018 Initial Diagnosis   Malignant neoplasm of lower-inner quadrant of left breast in female, estrogen receptor positive ([name])   02/21/2018 Surgery    LEFT BREAST LUMPECTOMY WITH AXILLARY LYMPH NODE BIOPSY by Dr. Aron  02/21/18   02/21/2018 Pathology Results   Diagnosis 02/21/18 1. Breast, lumpectomy, Left - INVASIVE DUCTAL CARCINOMA, GRADE I, 1.6 CM. - DUCTAL CARCINOMA IN SITU, INTERMEDIATE NUCLEAR GRADE. - ANTERIOR AND MEDIAL RESECTION MARGINS ARE POSITIVE FOR CARCINOMA. - NEGATIVE FOR LYMPHOVASCULAR OR PERINEURAL INVASION. - BACKGROUND BREAST TISSUE WITH FIBROCYSTIC CHANGE, INCLUDING SCLEROSING ADENOSIS. - BIOPSY SITE CHANGES. - SEE ONCOLOGY TABLE. 2. Lymph node, sentinel, biopsy, Left Axillary #1 - METASTATIC BREAST CARCINOMA TO A LYMPH NODE, 1.0 CM IN GREATEST DIMENSION, WITH EXTRANODAL EXTENSION (1/1). 3. Lymph node, sentinel, biopsy, Left Axillary #2 - LYMPH NODE, NEGATIVE FOR CARCINOMA (0/1).    02/21/2018 Miscellaneous   Mammaprint 02/21/18 Low Risk with 10-year risk of recurrnce at 10% -No potential signifcant chemotherapy benefit   03/20/2018 Pathology Results   RE-EXCISION OF BREAST LUMPECTOMY by Dr. Aron  Diagnosis 03/20/18 1. Breast, excision, Left new anterior margin - FIBROCYSTIC CHANGES WITH ADENOSIS AND CALCIFICATIONS. - HEALING BIOPSY SITE. - THERE IS NO EVIDENCE OF MALIGNANCY. 2. Breast, excision, Left new medial margin - FIBROCYSTIC CHANGES WITH  ADENOSIS AND CALCIFICATIONS. - HEALING BIOPSY SITE. - THERE IS NO EVIDENCE OF MALIGNANCY. Microscopic Comment 1. -2. The surgical resection margin(s) of the specimen were inked and microscopically evaluated. (JBK:kh 03-22-18)   04/09/2018 Cancer Staging   Staging form: Breast, AJCC 8th Edition - Pathologic: Stage IA (pT1c, pN1, cM0, G1, ER+, PR+, HER2-) - Signed by [name], MD on 04/09/2018   04/22/2018 - 06/03/2018 Radiation Therapy   Radaiton with Dr. Izell 04/22/18-06/03/18   05/2018 -  Anti-estrogen oral therapy   Letrozole  2.5mg  started 05/2018    Survivorship   Per Mayme Silversmith, NP    05/04/2022 Imaging    IMPRESSION: Cervical spondylosis, as described.   Nonspecific straightening of the expected cervical lordosis.   07/24/2022 Imaging    IMPRESSION: 1. Extensive osseous metastatic disease with pathologic fracture at the base of dens and C2 right lateral mass. Extraosseous tumor at C1 and C2 likely impinging on the right C2 and C3 nerve roots. 2. Degenerative cord impingement at C4-5 to C6-7. Biforaminal impingement at C5-6 and C6-7.   08/03/2022 PET scan    IMPRESSION: 1. Large volume osseous metastasis. 2. Low right cervical and probable right axillary nodal metastasis. 3. Subtle heterogeneous activity throughout the liver with suggestion of small liver lesions (likely new compared to chest CT of 07/16/2020). Findings are overall moderately suspicious for hepatic metastasis. Pre and post contrast abdominal MRI (preferred) or CT could confirm. 4. Right-sided pleural thickening and trace pleural fluid. Right base airspace disease is favored to represent chronic atelectasis. 5. Aortic atherosclerosis (ICD10-I70.0) and emphysema (ICD10-J43.9).     08/11/2022 Genetic Testing   Negative genetic testing on the CancerNext-Expanded+RNAinsight panel.  The report date is August 11, 2022.  The CancerNext-Expanded gene panel offered by [name] and includes  sequencing and rearrangement analysis for the following 77 genes: AIP, ALK, APC*, ATM*, AXIN2, BAP1, BARD1, BLM, BMPR1A, BRCA1*, BRCA2*, BRIP1*, CDC73, CDH1*, CDK4, CDKN1B, CDKN2A, CHEK2*, CTNNA1, DICER1, FANCC, FH, FLCN, GALNT12, KIF1B, LZTR1, MAX, MEN1, MET, MLH1*, MSH2*, MSH3, MSH6*, MUTYH*, NBN, NF1*, NF2, NTHL1, PALB2*, PHOX2B, PMS2*, POT1, PRKAR1A, PTCH1, PTEN*, RAD51C*, RAD51D*, RB1, RECQL, RET, SDHA, SDHAF2, SDHB, SDHC, SDHD, SMAD4, SMARCA4, SMARCB1, SMARCE1, STK11, SUFU, TMEM127, TP53*, TSC1, TSC2, VHL and XRCC2 (sequencing and deletion/duplication); EGFR, EGLN1, HOXB13, KIT, MITF, PDGFRA, POLD1, and POLE (sequencing only); EPCAM and GREM1 (deletion/duplication only). DNA and RNA analyses performed for * genes.    Lobular carcinoma in situ (LCIS) of right breast  01/20/2020 Mammogram   Diagnostic Mammogram 01/20/20 IMPRESSION: 1.  Stable post lumpectomy changes of the left breast.   2. Suspicious microcalcifications over the right upper outer quadrant spanning 3.6 cm.   02/02/2020 Initial Biopsy   Diagnosis 02/02/20 Breast, right, needle core biopsy, upper outer quadrant, x clip - LOBULAR CARCINOMA IN SITU WITH PLEOMORPHIC FEATURES AND CALCIFICATIONS, INVOLVING ADENOSIS. SEE NOTE Diagnosis Note Immunohistochemical stain for E-cadherin is negative in the lesional cells, consistent with a lobular phenotype. Immunostains for p63, SMM 1 and calponin do not show evidence of invasive carcinoma.    02/04/2020 Initial Diagnosis   Lobular carcinoma in situ (LCIS) of right breast   03/18/2020 Surgery   RIGHT BREAST LUMPECTOMY WITH RADIOACTIVE SEED LOCALIZATION by Dr Marla    03/18/2020 Pathology Results   FINAL MICROSCOPIC DIAGNOSIS:   A. BREAST, RIGHT, LUMPECTOMY:  - Pleomorphic lobular carcinoma in situ with calcifications and  underlying complex sclerosing lesion, adenosis and fibroadenomatoid  change.  - Margins of resection are not involved (Closest margins: < 1 mm,  anterior, posterior,  inferior and medial).  - Biopsy site.    COMMENT:   P63, Calponin and SMM-1 demonstrate the presence of myoepithelium in the  select focus.    Metastatic breast cancer to bone and brain  05/04/2022 Imaging    IMPRESSION: Cervical spondylosis, as described.   Nonspecific straightening of the expected cervical lordosis.   07/24/2022 Imaging    IMPRESSION: 1. Extensive osseous metastatic disease with pathologic fracture at the base of dens and C2 right lateral mass. Extraosseous tumor at C1 and C2 likely impinging on the right C2 and C3 nerve roots. 2. Degenerative cord impingement at C4-5 to C6-7. Biforaminal impingement at C5-6 and C6-7.   08/02/2022 Initial Diagnosis   Cancer, metastatic to bone ([name])   08/03/2022 PET scan    IMPRESSION: 1. Large volume osseous metastasis. 2. Low right cervical and probable right axillary nodal metastasis. 3. Subtle heterogeneous activity throughout the liver with suggestion of small liver lesions (likely new compared to chest CT of 07/16/2020). Findings are overall moderately suspicious for hepatic metastasis. Pre and post contrast abdominal MRI (preferred) or CT could confirm. 4. Right-sided pleural thickening and trace pleural fluid. Right base airspace disease is favored to represent chronic atelectasis. 5. Aortic atherosclerosis (ICD10-I70.0) and emphysema (ICD10-J43.9).     10/26/2022 - 04/12/2023 Chemotherapy   Patient is on Treatment Plan : BREAST Paclitaxel  D1,8,15 + Trastuzumab  D1 + Pertuzumab  D1 q21d x 8 cycles / Trastuzumab  D1 + Pertuzumab  D1 q21d x 4 cycles     05/03/2023 -  Chemotherapy   Patient is on Treatment Plan : BREAST MAINTENANCE Trastuzumab  IV (6) or SQ (600) D1 q21d X 11 Cycles        CURRENT THERAPY: Trastuzumab  q. 21 days, Xeloda  twice daily 1 week on/1 week off, and Tucatinib  twice daily   INTERVAL HISTORY [name] returns for follow-up, here with her husband.  She continues to feel well with positive  attitude.  Tolerating treatment. She does bruise easily but no bleeding.  Thumbs have painful cracks.  Twice a month she has unexpected diarrhea that resolves with medication.  She is able to eat and drink.  Energy is adequate.  She is starting PT/dry needling to relax her neck.  Back always hurts without new or increased bone pain.  ROS  All other systems reviewed and negative  Past Medical History:  Diagnosis Date   Anxiety    Cancer ([name]) 2022   right breast LCIS   Cancer ([name]) 2019   left breast   Depression    Dysrhythmia    SVT, s/p ablation ~ 2012 in at [name]   Ectopic pregnancy    Family history of breast cancer    Family history of melanoma    History of radiation therapy 04/22/18-06/03/18   Left Breast, left SCV, axilla 50 Gy in 25 fractions, Left breast boost 10 Gy in 5 fractions.    Hypothyroidism    Personal history of radiation therapy    PONV (postoperative nausea and vomiting)    Thyroid  disease      Past Surgical History:  Procedure Laterality Date   APPENDECTOMY     BILATERAL SALPINGECTOMY     BREAST BIOPSY Right 2014   fibroadenoma   BREAST BIOPSY Left 01/16/2018   BREAST BIOPSY Right 02/02/2020   LCIS   BREAST BIOPSY Right 08/04/2020   x2 LCIS   BREAST BIOPSY Right 08/13/2020   x2   BREAST EXCISIONAL BIOPSY Left 1990   benign  BREAST EXCISIONAL BIOPSY Right 02/02/2020   LCIS   BREAST LUMPECTOMY Left 01/22/2018   BREAST LUMPECTOMY WITH AXILLARY LYMPH NODE BIOPSY Left 02/21/2018   Procedure: LEFT BREAST LUMPECTOMY WITH AXILLARY LYMPH NODE BIOPSY;  Surgeon: [name], MD;  Location: [facility] OR;  Service: General;  Laterality: Left;   BREAST LUMPECTOMY WITH RADIOACTIVE SEED LOCALIZATION Right 03/18/2020   Procedure: RIGHT BREAST LUMPECTOMY WITH RADIOACTIVE SEED LOCALIZATION;  Surgeon: [name], MD;  Location: [facility] OR;  Service: General;  Laterality: Right;   BREAST SURGERY Left 1993   cyst removed   ENDOMETRIAL ABLATION     EYE  SURGERY     GLAUCOMA SURGERY Bilateral    IR IMAGING GUIDED PORT INSERTION  10/16/2022   POSTERIOR CERVICAL FUSION/FORAMINOTOMY N/A 08/17/2022   Procedure: Cervical One-Cervical Four POSTERIOR CERVICAL FUSION,  Cervical One LAMINECTOMY REDUCTION OF Cervical Two Fracture, Biopsy of Right Cerical Two Pars  Lesion;  Surgeon: Debby Dorn MATSU, MD;  Location: [name];  Service: Neurosurgery;  Laterality: N/A;   POSTERIOR CERVICAL FUSION/FORAMINOTOMY N/A 09/06/2022   Procedure: Posterior Cervical Fusion, Foraminotomy , Cervical Five-Six, Cervical Six-Seven; Extension of  fusion Cervical Four- Thoracic One;  Surgeon: Debby Dorn MATSU, MD;  Location: [name];  Service: Neurosurgery;  Laterality: N/A;   RADIOACTIVE SEED GUIDED EXCISIONAL BREAST BIOPSY Right 04/28/2021   Procedure: RADIOACTIVE SEED GUIDED EXCISIONAL RIGHT BREAST BIOPSY X2;  Surgeon: [name], MD;  Location: [name];  Service: General;  Laterality: Right;   RE-EXCISION OF BREAST LUMPECTOMY Left 03/20/2018   Procedure: RE-EXCISION OF BREAST LUMPECTOMY;  Surgeon: [name], MD;  Location: [name];  Service: General;  Laterality: Left;     Outpatient Encounter Medications as of 08/26/2024  Medication Sig Note   acetaminophen  (TYLENOL ) 500 MG tablet Take 2 tablets (1,000 mg total) by mouth every 8 (eight) hours.    capecitabine  (XELODA ) 500 MG tablet TAKE 2 TABLETS TWICE DAILY FOR 7 DAYS ON, THEN 7 DAYS OFF (TAKE AFTER MEALS)    citalopram  (CELEXA ) 40 MG tablet Take 1 tablet (40 mg total) by mouth daily.    diphenoxylate -atropine  (LOMOTIL ) 2.5-0.025 MG tablet Take 1 tablet by mouth 4 (four) times daily as needed for diarrhea or loose stools.    levothyroxine  (SYNTHROID ) 75 MCG tablet Take 1 tablet (75 mcg total) by mouth daily before breakfast.    loperamide  (IMODIUM  A-D) 2 MG tablet Take 1 tablet (2 mg total) by mouth 4 (four) times daily as needed for diarrhea or loose stools.    Mouthwashes (MOUTH  RINSE) LIQD solution 15 mLs by Mouth Rinse route as needed (for oral care).    ondansetron  (ZOFRAN ) 8 MG tablet Take 1 tablet (8 mg total) by mouth every 8 (eight) hours as needed for nausea or vomiting.    OVER THE COUNTER MEDICATION Take 1 tablet by mouth daily. Protandim Supplement 01/10/2024: Calcium and vitamin D   pregabalin  (LYRICA ) 25 MG capsule Take 1 capsule (25 mg total) by mouth 3 (three) times daily.    tucatinib  (TUKYSA ) 150 MG tablet Take 2 tablets (300 mg total) by mouth 2 (two) times daily. Take as instructed per MD.    No facility-administered encounter medications on file as of 08/26/2024.     Today's Vitals   08/26/24 1313  BP: 116/72  Pulse: 68  Resp: 17  Temp: 97.8 F (36.6 C)  TempSrc: Temporal  SpO2: 95%  Weight: 74 lb 11.2 oz (33.9 kg)  Height: 5' 2 (1.575 m)  Body mass index is 13.66 kg/m.   ECOG PERFORMANCE STATUS: 1 - Symptomatic but completely ambulatory  PHYSICAL EXAM GENERAL:alert, no distress and comfortable SKIN: no rash.  Palms dry with cracks at the thumb tips EYES: sclera clear LUNGS: normal breathing effort HEART:  no lower extremity edema NEURO: alert & oriented x 3 with fluent speech, no focal motor/sensory deficits PAC without erythema    CBC    Latest Ref Rng & Units 08/26/2024   12:30 PM 08/07/2024   11:39 AM 07/17/2024    2:18 PM  CBC  WBC 4.0 - 10.5 K/uL 5.5  6.1  5.2   Hemoglobin 12.0 - 15.0 g/dL 84.8  85.3  85.5   Hematocrit 36.0 - 46.0 % 43.1  42.2  40.2   Platelets 150 - 400 K/uL 148  148  120       CMP     Latest Ref Rng & Units 08/26/2024   12:30 PM 08/07/2024   11:39 AM 07/17/2024    2:18 PM  CMP  Glucose 70 - 99 mg/dL 93  896  99   BUN 8 - 23 mg/dL 12  16  13    Creatinine 0.44 - 1.00 mg/dL 9.44  9.45  9.50   Sodium 135 - 145 mmol/L 138  138  135   Potassium 3.5 - 5.1 mmol/L 4.1  4.2  3.9   Chloride 98 - 111 mmol/L 101  100  97   CO2 22 - 32 mmol/L 28  29  29    Calcium 8.9 - 10.3 mg/dL 9.4  9.6  9.9    Total Protein 6.5 - 8.1 g/dL 6.8  6.8  6.6   Total Bilirubin 0.0 - 1.2 mg/dL 0.9  0.6  1.3   Alkaline Phos 38 - 126 U/L 94  84  87   AST 15 - 41 U/L 29  23  28    ALT 0 - 44 U/L 19  13  21        ASSESSMENT & PLAN:[name] is a 63 y.o. female with    Metastatic breast cancer to bone and brain, ER/PR - HER2 + -Diagnosed in 06/2022 -PET  08/03/2022 showed diffuse bone mets  -s/p cervical laminectomy and fusion by Dr. Debby on 08/17/2022, biopsy confirmed metastatic breast cancer, ER/PR negative, HER2 positive. -due to cervical cord compression with myelopathy, she underwent a second cervical spine surgery on 09/06/2022, and completed inpatient rehabitation. -S/p brain, cervical and lumbar spine radiation in 09/2022 -She started first line systemic chemotherapy with weekly Taxol  and trastuzumab /Perjeta  on 10/25/22.  -She tolerated well and achieved complete metabolic response on PET, but unfortunately developed diffuse, innumerable new brain metastasis -She met with Dr. Izell and is open to whole brain radiation but holding for now -Changed to Second line Xeloda  (BID 7 days on/7 days off), Tucatinib  (BID) and Trastuzumab  (q3 weeks) to penetrate BBB better starting 03/2023 and continues zometa  q3 months   -PET scan 07/05/2023 stable, showed no recurrent metabolically active metastatic disease.  Continue the current regimen -Most recent imaging PET 02/19/24 and 07/07/24 showed no hypermetabolic disease, and brain MRI 07/07/24 showed improved brain mets -[name] appears stable, tolerating treatment well overall. Able to recover and function with adequate PS. No clinical evidence of disease progression   Malignant neoplasm of lower-inner quadrant of left breast in female, estrogen receptor positive She was diagnosed in 12/2017. She is s/p left breast lumpectomy and adjuvant radiation.  -She started anti-estrogen therapy with letrozole  on 05/2018. Tolerating  well with no issues but is no  longer taking -lost f/u after visit in 04/2020 until her recurrence in 07/2022    Cancer related pain -2/2 bone metastasis, improved after RT -Currently managed on Cymbalta, lyrica , extra strength tylenol , and rare oxycodone   -f/u with palliative care clinic NP Nikki       PLAN: - Labs reviewed -Proceed with trastuzumab  without dose modification, continue every 3 weeks -Continue Tucatinib  twice daily and Xeloda  twice daily for 1 week on/1 week off as prescribed -Rx voltaren  for hand-foot syndrome -Next brain MRI and f/up Dr. Buckley 09/2024 as scheduled -Follow-up with us  in 6 weeks, or sooner if needed   All questions were answered. The patient knows to call the clinic with any problems, questions or concerns. No barriers to learning were detected.   Leomar Westberg K Jolyne Laye, NP 08/26/2024  "

## 2024-08-26 ENCOUNTER — Inpatient Hospital Stay

## 2024-08-26 ENCOUNTER — Encounter: Payer: Self-pay | Admitting: Nurse Practitioner

## 2024-08-26 ENCOUNTER — Inpatient Hospital Stay (HOSPITAL_BASED_OUTPATIENT_CLINIC_OR_DEPARTMENT_OTHER): Admitting: Nurse Practitioner

## 2024-08-26 VITALS — BP 116/72 | HR 68 | Temp 97.8°F | Resp 17 | Ht 62.0 in | Wt 74.7 lb

## 2024-08-26 DIAGNOSIS — C50312 Malignant neoplasm of lower-inner quadrant of left female breast: Secondary | ICD-10-CM

## 2024-08-26 DIAGNOSIS — C7951 Secondary malignant neoplasm of bone: Secondary | ICD-10-CM

## 2024-08-26 DIAGNOSIS — Z95828 Presence of other vascular implants and grafts: Secondary | ICD-10-CM

## 2024-08-26 LAB — CBC WITH DIFFERENTIAL (CANCER CENTER ONLY)
Abs Immature Granulocytes: 0.02 K/uL (ref 0.00–0.07)
Basophils Absolute: 0 K/uL (ref 0.0–0.1)
Basophils Relative: 0 %
Eosinophils Absolute: 0 K/uL (ref 0.0–0.5)
Eosinophils Relative: 1 %
HCT: 43.1 % (ref 36.0–46.0)
Hemoglobin: 15.1 g/dL — ABNORMAL HIGH (ref 12.0–15.0)
Immature Granulocytes: 0 %
Lymphocytes Relative: 15 %
Lymphs Abs: 0.8 K/uL (ref 0.7–4.0)
MCH: 37.1 pg — ABNORMAL HIGH (ref 26.0–34.0)
MCHC: 35 g/dL (ref 30.0–36.0)
MCV: 105.9 fL — ABNORMAL HIGH (ref 80.0–100.0)
Monocytes Absolute: 0.6 K/uL (ref 0.1–1.0)
Monocytes Relative: 10 %
Neutro Abs: 4 K/uL (ref 1.7–7.7)
Neutrophils Relative %: 74 %
Platelet Count: 148 K/uL — ABNORMAL LOW (ref 150–400)
RBC: 4.07 MIL/uL (ref 3.87–5.11)
RDW: 15 % (ref 11.5–15.5)
WBC Count: 5.5 K/uL (ref 4.0–10.5)
nRBC: 0 % (ref 0.0–0.2)

## 2024-08-26 LAB — CMP (CANCER CENTER ONLY)
ALT: 19 U/L (ref 0–44)
AST: 29 U/L (ref 15–41)
Albumin: 4.6 g/dL (ref 3.5–5.0)
Alkaline Phosphatase: 94 U/L (ref 38–126)
Anion gap: 8 (ref 5–15)
BUN: 12 mg/dL (ref 8–23)
CO2: 28 mmol/L (ref 22–32)
Calcium: 9.4 mg/dL (ref 8.9–10.3)
Chloride: 101 mmol/L (ref 98–111)
Creatinine: 0.55 mg/dL (ref 0.44–1.00)
GFR, Estimated: >60 mL/min
Glucose, Bld: 93 mg/dL (ref 70–99)
Potassium: 4.1 mmol/L (ref 3.5–5.1)
Sodium: 138 mmol/L (ref 135–145)
Total Bilirubin: 0.9 mg/dL (ref 0.0–1.2)
Total Protein: 6.8 g/dL (ref 6.5–8.1)

## 2024-08-26 MED ORDER — ACETAMINOPHEN 325 MG PO TABS
650.0000 mg | ORAL_TABLET | Freq: Once | ORAL | Status: AC
  Administered 2024-08-26: 650 mg via ORAL
  Filled 2024-08-26: qty 2

## 2024-08-26 MED ORDER — ZOLEDRONIC ACID 4 MG/100ML IV SOLN
4.0000 mg | Freq: Once | INTRAVENOUS | Status: AC
  Administered 2024-08-26: 4 mg via INTRAVENOUS
  Filled 2024-08-26: qty 100

## 2024-08-26 MED ORDER — DICLOFENAC SODIUM 1 % EX GEL: CUTANEOUS | 1 refills | Status: DC

## 2024-08-26 MED ORDER — TRASTUZUMAB-ANNS CHEMO 150 MG IV SOLR
6.0000 mg/kg | Freq: Once | INTRAVENOUS | Status: AC
  Administered 2024-08-26: 210 mg via INTRAVENOUS
  Filled 2024-08-26: qty 10

## 2024-08-26 MED ORDER — SODIUM CHLORIDE 0.9 % IV SOLN: Freq: Once | INTRAVENOUS | Status: AC

## 2024-08-26 NOTE — Patient Instructions (Signed)

## 2024-08-29 ENCOUNTER — Encounter: Payer: Self-pay | Admitting: Nurse Practitioner

## 2024-08-29 ENCOUNTER — Encounter: Payer: Self-pay | Admitting: Hematology

## 2024-08-29 ENCOUNTER — Other Ambulatory Visit: Payer: Self-pay | Admitting: Nurse Practitioner

## 2024-08-29 MED ORDER — DICLOFENAC SODIUM 1 % EX GEL: CUTANEOUS | 1 refills | Status: AC

## 2024-09-01 ENCOUNTER — Other Ambulatory Visit: Payer: Self-pay | Admitting: Nurse Practitioner

## 2024-09-01 DIAGNOSIS — Z515 Encounter for palliative care: Secondary | ICD-10-CM

## 2024-09-01 DIAGNOSIS — M792 Neuralgia and neuritis, unspecified: Secondary | ICD-10-CM

## 2024-09-01 DIAGNOSIS — C7951 Secondary malignant neoplasm of bone: Secondary | ICD-10-CM

## 2024-09-01 DIAGNOSIS — F32A Depression, unspecified: Secondary | ICD-10-CM

## 2024-09-01 MED ORDER — CITALOPRAM HYDROBROMIDE 40 MG PO TABS: 40.0000 mg | ORAL_TABLET | Freq: Every day | ORAL | 3 refills | Status: AC

## 2024-09-08 ENCOUNTER — Other Ambulatory Visit: Payer: Self-pay | Admitting: Nurse Practitioner

## 2024-09-08 DIAGNOSIS — C7951 Secondary malignant neoplasm of bone: Secondary | ICD-10-CM

## 2024-09-08 DIAGNOSIS — Z515 Encounter for palliative care: Secondary | ICD-10-CM

## 2024-09-08 DIAGNOSIS — M792 Neuralgia and neuritis, unspecified: Secondary | ICD-10-CM

## 2024-09-08 MED ORDER — PREGABALIN 25 MG PO CAPS: 25.0000 mg | ORAL_CAPSULE | Freq: Three times a day (TID) | ORAL | 3 refills | Status: AC

## 2024-09-12 ENCOUNTER — Other Ambulatory Visit: Payer: Self-pay

## 2024-09-12 ENCOUNTER — Encounter (HOSPITAL_BASED_OUTPATIENT_CLINIC_OR_DEPARTMENT_OTHER): Payer: Self-pay | Admitting: Physical Therapy

## 2024-09-12 ENCOUNTER — Ambulatory Visit (HOSPITAL_BASED_OUTPATIENT_CLINIC_OR_DEPARTMENT_OTHER): Attending: Nurse Practitioner | Admitting: Physical Therapy

## 2024-09-12 DIAGNOSIS — M25611 Stiffness of right shoulder, not elsewhere classified: Secondary | ICD-10-CM | POA: Diagnosis not present

## 2024-09-12 DIAGNOSIS — M62838 Other muscle spasm: Secondary | ICD-10-CM | POA: Diagnosis present

## 2024-09-12 DIAGNOSIS — M542 Cervicalgia: Secondary | ICD-10-CM | POA: Insufficient documentation

## 2024-09-12 DIAGNOSIS — M25612 Stiffness of left shoulder, not elsewhere classified: Secondary | ICD-10-CM | POA: Insufficient documentation

## 2024-09-12 DIAGNOSIS — Z170 Estrogen receptor positive status [ER+]: Secondary | ICD-10-CM | POA: Diagnosis not present

## 2024-09-12 DIAGNOSIS — C7951 Secondary malignant neoplasm of bone: Secondary | ICD-10-CM | POA: Diagnosis not present

## 2024-09-12 DIAGNOSIS — C50312 Malignant neoplasm of lower-inner quadrant of left female breast: Secondary | ICD-10-CM | POA: Diagnosis not present

## 2024-09-12 DIAGNOSIS — R293 Abnormal posture: Secondary | ICD-10-CM | POA: Insufficient documentation

## 2024-09-12 NOTE — Therapy (Unsigned)
 "   OUTPATIENT PHYSICAL THERAPY CERVICAL EVALUATION   Patient Name: Martha [name] MRN: [id number] DOB:Dec 14, 1960, 64 y.o., female Today's Date: 09/14/2024  END OF SESSION:  PT End of Session - 09/14/24 0857     Visit Number 1    Number of Visits 16    Date for Recertification  11/09/24    PT Start Time 0800    PT Stop Time 0844    PT Time Calculation (min) 44 min    Activity Tolerance Patient tolerated treatment well    Behavior During Therapy [facility] for tasks assessed/performed          Past Medical History:  Diagnosis Date   Anxiety    Cancer ([name]) 2022   right breast LCIS   Cancer ([name]) 2019   left breast   Depression    Dysrhythmia    SVT, s/p ablation ~ 2012 in at [facility]   Ectopic pregnancy    Family history of breast cancer    Family history of melanoma    History of radiation therapy 04/22/18-06/03/18   Left Breast, left SCV, axilla 50 Gy in 25 fractions, Left breast boost 10 Gy in 5 fractions.    Hypothyroidism    Personal history of radiation therapy    PONV (postoperative nausea and vomiting)    Thyroid  disease    Past Surgical History:  Procedure Laterality Date   APPENDECTOMY     BILATERAL SALPINGECTOMY     BREAST BIOPSY Right 2014   fibroadenoma   BREAST BIOPSY Left 01/16/2018   BREAST BIOPSY Right 02/02/2020   LCIS   BREAST BIOPSY Right 08/04/2020   x2 LCIS   BREAST BIOPSY Right 08/13/2020   x2   BREAST EXCISIONAL BIOPSY Left 1990   benign   BREAST EXCISIONAL BIOPSY Right 02/02/2020   LCIS   BREAST LUMPECTOMY Left 01/22/2018   BREAST LUMPECTOMY WITH AXILLARY LYMPH NODE BIOPSY Left 02/21/2018   Procedure: LEFT BREAST LUMPECTOMY WITH AXILLARY LYMPH NODE BIOPSY;  Surgeon: [name], MD;  Location: [facility] OR;  Service: General;  Laterality: Left;   BREAST LUMPECTOMY WITH RADIOACTIVE SEED LOCALIZATION Right 03/18/2020   Procedure: RIGHT BREAST LUMPECTOMY WITH RADIOACTIVE SEED LOCALIZATION;  Surgeon: [name], MD;   Location: [facility] OR;  Service: General;  Laterality: Right;   BREAST SURGERY Left 1993   cyst removed   ENDOMETRIAL ABLATION     EYE SURGERY     GLAUCOMA SURGERY Bilateral    IR IMAGING GUIDED PORT INSERTION  10/16/2022   POSTERIOR CERVICAL FUSION/FORAMINOTOMY N/A 08/17/2022   Procedure: Cervical One-Cervical Four POSTERIOR CERVICAL FUSION,  Cervical One LAMINECTOMY REDUCTION OF Cervical Two Fracture, Biopsy of Right Cerical Two Pars  Lesion;  Surgeon: Debby Dorn MATSU, MD;  Location: [facility] OR;  Service: Neurosurgery;  Laterality: N/A;   POSTERIOR CERVICAL FUSION/FORAMINOTOMY N/A 09/06/2022   Procedure: Posterior Cervical Fusion, Foraminotomy , Cervical Five-Six, Cervical Six-Seven; Extension of  fusion Cervical Four- Thoracic One;  Surgeon: Debby Dorn MATSU, MD;  Location: [facility] OR;  Service: Neurosurgery;  Laterality: N/A;   RADIOACTIVE SEED GUIDED EXCISIONAL BREAST BIOPSY Right 04/28/2021   Procedure: RADIOACTIVE SEED GUIDED EXCISIONAL RIGHT BREAST BIOPSY X2;  Surgeon: [name], MD;  Location: [facility];  Service: General;  Laterality: Right;   RE-EXCISION OF BREAST LUMPECTOMY Left 03/20/2018   Procedure: RE-EXCISION OF BREAST LUMPECTOMY;  Surgeon: [name], MD;  Location: [facility];  Service: General;  Laterality: Left;   Patient Active Problem List  Diagnosis Date Noted   Port-A-Cath in place 11/02/2022   Hypokalemia 10/20/2022   Hypomagnesemia 10/20/2022   Protein-calorie malnutrition, severe 10/18/2022   DNR (do not resuscitate) 10/16/2022   High risk medication use 10/16/2022   Goals of care, counseling/discussion 10/16/2022   Palliative care encounter 10/16/2022   Change in mental status 10/12/2022   AMS (altered mental status) 10/12/2022   Hallucination 10/12/2022   Depression 10/12/2022   Cancer related pain 10/12/2022   Acute incomplete quadriplegia ([name]) 09/14/2022   Cervical myelopathy ([name]) 09/13/2022   Elevated alkaline phosphatase  level 09/04/2022   Paresthesia and weakness of both lower extremities 09/04/2022   Secondary cancer of brain ([name]) 09/01/2022   C2 cervical fracture ([name]) 08/17/2022   Metastatic adenocarcinoma ([name]) 08/17/2022   Medication management 08/14/2022   Metastatic breast cancer to bone and brain 08/02/2022   Family history of melanoma 08/02/2022   Family history of breast cancer 08/02/2022   Cervicalgia 06/16/2022   Stress 05/04/2022   Hyperglycemia 04/14/2021   Lobular carcinoma in situ (LCIS) of right breast 02/04/2020   Malignant neoplasm of lower-inner quadrant of left breast in female, estrogen receptor positive ([name]) 01/22/2018   Hypothyroidism 04/04/2017   Nicotine  dependence with current use 04/04/2017   Excessive drinking of alcohol 04/04/2017   Menopausal symptom 04/04/2017    PCP: Worth Kitty MD  REFERRING PROVIDER: Lacie [name] NP  REFERRING DIAG:  Diagnosis  C79.51 (ICD-10-CM) - Cancer, metastatic to bone ([name])  C50.312,Z17.0 (ICD-10-CM) - Malignant neoplasm of lower-inner quadrant of left breast in female, estrogen receptor positive ([name])    THERAPY DIAG:  Cervicalgia  Other muscle spasm  Abnormal posture  Rationale for Evaluation and Treatment: Rehabilitation  ONSET DATE: several years   SUBJECTIVE:                                                                                                                                                                                                         SUBJECTIVE STATEMENT: Pt is attending PT for dry needling for their neck. Pt has received dry needling tx here before with good outcomes. Pt has a hx of breast CA, stenosis, and OA. An MRI in 2024 revealed that pt has neck CA. Pt has received clearance from Dr to receive dry needling and is not actively receiving chemo tx. Pt gets immunotherapy every 3 weeks and takes a pill at home. Pt had 2 separate surgeries in 2024 to fuse their cervical spine to prevent paralyzation  and nerve compression. No fx in neck currently and MRI scheduled for February.  Pt would like to improve their balance, ROM, and pain.   Hand dominance: Right  PERTINENT HISTORY:  Neuropathy, OA, stenosis, breast cancer, cervical cancer, cervical spinal fusion.   PAIN:  Are you having pain? Yes: NPRS scale: Minimal, can get to a 5 Pain location: cervical spine and shoulder Pain description: ache, spasm, tightness Aggravating factors: Hurts all the time Relieving factors: Pretty sedentary   PRECAUTIONS: Cervical  RED FLAGS: None Despite cancer patient sent to PT for dry needling     WEIGHT BEARING RESTRICTIONS: No  FALLS:  Has patient fallen in last 6 months? No  LIVING ENVIRONMENT: Lives with: lives with their spouse friends and community members who come to visit Lives in: House/apartment Has following equipment at home: None  OCCUPATION: Disabled - doesn't work - also retired  PLOF: Independent with gait, Needs assistance with ADLs, and Needs assistance with homemaking  PATIENT GOALS: Feel better, get stronger, more confident gait, improvements in balance.    OBJECTIVE:  Note: Objective measures were completed at Evaluation unless otherwise noted.  DIAGNOSTIC FINDINGS:  None provided   COGNITION: Overall cognitive status: Within functional limits for tasks assessed  SENSATION: WFL  POSTURE: forward head  PALPATION: TP In shoulders    CERVICAL ROM:   Active ROM A/PROM (deg) eval  Flexion   Extension Limited  Right lateral flexion   Left lateral flexion   Right rotation 20  Left rotation 33   (Blank rows = not tested)  UPPER EXTREMITY ROM:  Active ROM Right eval Left eval  Shoulder flexion ~125 ~100  Shoulder extension    Shoulder abduction    Shoulder adduction    Shoulder extension    Shoulder internal rotation    Shoulder external rotation    Elbow flexion    Elbow extension    Wrist flexion    Wrist extension    Wrist ulnar  deviation    Wrist radial deviation    Wrist pronation    Wrist supination     (Blank rows = not tested)  UPPER EXTREMITY MMT:  MMT Right eval Left eval  Shoulder flexion    Shoulder extension    Shoulder abduction    Shoulder adduction    Shoulder extension    Shoulder internal rotation    Shoulder external rotation    Middle trapezius    Lower trapezius    Elbow flexion    Elbow extension    Wrist flexion    Wrist extension    Wrist ulnar deviation    Wrist radial deviation    Wrist pronation    Wrist supination    Grip strength     (Blank rows = not tested)Not tested 2nd to pain   Balance: Decreased narrow base of support Decreased balance with eyes closed Berg balance testing to be performed next visit   TREATMENT DATE: 1/16          Trigger Point Dry Needling  Initial Treatment: Pt instructed on Dry Needling rational, procedures, and possible side effects. Pt instructed to expect mild to moderate muscle soreness later in the day and/or into the next day.  Pt instructed in methods to reduce muscle soreness. Pt instructed to continue prescribed HEP. Because Dry Needling was performed over or adjacent to a lung field, pt was educated on S/S of pneumothorax and to seek immediate medical attention should they occur.  Patient was educated on signs and symptoms of infection and other risk factors and advised to seek medical attention  should they occur.  Patient verbalized understanding of these instructions and education.   Patient Verbal Consent Given: Yes Education Handout Provided: Yes Muscles Treated: bilateral upper traps 3x each upper trap with a .30x50 needle  Electrical Stimulation Performed: No Treatment Response/Outcome: Great twitch. Improved shoulder pain and motion                                                                                                                        Manual: Trigger point release to bilateral upper traps Review of self  soft tissue mobilization Review of self soft tissue mobilization techniques Passive range of motion of left shoulder into flexion and abduction with distraction Trap release  There ex: Review Scap retraction Review of cervical rotation within pain-free ranges   PATIENT EDUCATION:  Education details: Provided education re: balance training and how PT can help with that. Self-STM for post dry needling.  Person educated: Patient Education method: Explanation Education comprehension: verbalized understanding  HOME EXERCISE PROGRAM: Insert code  ASSESSMENT:  CLINICAL IMPRESSION: Patient is a 64 y.o. woman who was seen today for physical therapy evaluation and treatment for neck and shoulder tightness.  She is presents with significant limitations in cervical rotation, flexion, extension, as well as shoulder flexion and external rotation.  Her pain and movement limitations are affecting her overall function.  She also has moderate pain in both shoulders.  She has had a positive response to trigger point dry needling and manual therapy in the past.  Patient will benefit from skilled therapy to reduce pain levels and improve ability to perform ADLs.  OBJECTIVE IMPAIRMENTS: decreased activity tolerance, decreased balance, decreased mobility, decreased ROM, increased muscle spasms, postural dysfunction, and pain.   ACTIVITY LIMITATIONS: carrying, lifting, bending, standing, sleeping, dressing, reach over head, and hygiene/grooming  PARTICIPATION LIMITATIONS: cleaning, laundry, driving, and community activity  PERSONAL FACTORS: Age and Time since onset of injury/illness/exacerbation are also affecting patient's functional outcome.   REHAB POTENTIAL: Good  CLINICAL DECISION MAKING: Evolving/moderate complexity  EVALUATION COMPLEXITY: Moderate   GOALS: Goals reviewed with patient? Yes  SHORT TERM GOALS: Target date: 10/10/2024   Pt will have a 75% dec in shoulder tightness Baseline:   Goal status: INITIAL  2.  Pt will inc UE flexion AROM by 15  Baseline:  Goal status: INITIAL  3.  Pt will inc neck rotation AROM by 10 bilaterally Baseline:  Goal status: INITIAL  LONG TERM GOALS: Target date: 11/07/2024  Pt will have improved balance to help prevent falls per Lars balance score Baseline:  Goal status: INITIAL  2.  Pt will have inc AROM to allow for higher participation in functional ADLs and be able to dress herself Baseline:  Goal status: INITIAL  3.  Pt will have improved gait and endurance in order to inc activity Baseline:  Goal status: INITIAL     PLAN:  PT FREQUENCY: 1-2x/week  PT DURATION: 12 weeks  PLANNED INTERVENTIONS: 97110-Therapeutic exercises, 97530- Therapeutic activity, [id number]- Neuromuscular re-education, 97535-  Self Care, 02859- Manual therapy, [phone]- Gait training, [phone] (1-2 muscles), 20561 (3+ muscles)- Dry Needling, Patient/Family education, Balance training, Joint mobilization, Spinal mobilization, and Vestibular training  PLAN FOR NEXT SESSION: Consider dry needling, balance training, and scapular/trap exercises (rows, etc.) to increase strength of back muscles and provide neck support.  Berg balance test next visit   Alm JINNY Don, PT 09/14/2024, 9:01 AM  Ayn Domangue SPT   I have reviewed and concur with this student's documentation.   Alm JINNY Don, PT 09/14/2024 9:24 AM   During this treatment session, the therapist was present, participating in and directing the treatment.     "

## 2024-09-14 ENCOUNTER — Encounter (HOSPITAL_BASED_OUTPATIENT_CLINIC_OR_DEPARTMENT_OTHER): Payer: Self-pay | Admitting: Physical Therapy

## 2024-09-16 ENCOUNTER — Other Ambulatory Visit: Payer: Self-pay

## 2024-09-16 DIAGNOSIS — C799 Secondary malignant neoplasm of unspecified site: Secondary | ICD-10-CM

## 2024-09-16 DIAGNOSIS — C50312 Malignant neoplasm of lower-inner quadrant of left female breast: Secondary | ICD-10-CM

## 2024-09-16 DIAGNOSIS — C7951 Secondary malignant neoplasm of bone: Secondary | ICD-10-CM

## 2024-09-16 NOTE — Progress Notes (Signed)
 Per Dr Lanny, OK to use Echocardiogram done on 04/25/2024 for today's Kanjinti  infusion.

## 2024-09-16 NOTE — Progress Notes (Signed)
 Verbal order w/readback from Dr Lanny for Echocardiogram to be done before pt's next Kanjinti  infusion.  Order placed and Cardiovascular Scheduler contacted to get appt scheduled.

## 2024-09-17 ENCOUNTER — Encounter: Payer: Self-pay | Admitting: Nurse Practitioner

## 2024-09-17 ENCOUNTER — Inpatient Hospital Stay

## 2024-09-17 ENCOUNTER — Inpatient Hospital Stay: Admitting: Nurse Practitioner

## 2024-09-17 ENCOUNTER — Inpatient Hospital Stay: Attending: Genetic Counselor

## 2024-09-17 VITALS — BP 94/68 | HR 64 | Temp 97.8°F | Resp 14 | Ht 62.0 in | Wt 74.0 lb

## 2024-09-17 DIAGNOSIS — Z79899 Other long term (current) drug therapy: Secondary | ICD-10-CM | POA: Insufficient documentation

## 2024-09-17 DIAGNOSIS — R530 Neoplastic (malignant) related fatigue: Secondary | ICD-10-CM

## 2024-09-17 DIAGNOSIS — C787 Secondary malignant neoplasm of liver and intrahepatic bile duct: Secondary | ICD-10-CM | POA: Diagnosis not present

## 2024-09-17 DIAGNOSIS — M792 Neuralgia and neuritis, unspecified: Secondary | ICD-10-CM

## 2024-09-17 DIAGNOSIS — Z66 Do not resuscitate: Secondary | ICD-10-CM | POA: Diagnosis not present

## 2024-09-17 DIAGNOSIS — Z5112 Encounter for antineoplastic immunotherapy: Secondary | ICD-10-CM | POA: Diagnosis present

## 2024-09-17 DIAGNOSIS — C50312 Malignant neoplasm of lower-inner quadrant of left female breast: Secondary | ICD-10-CM | POA: Insufficient documentation

## 2024-09-17 DIAGNOSIS — Z170 Estrogen receptor positive status [ER+]: Secondary | ICD-10-CM | POA: Diagnosis not present

## 2024-09-17 DIAGNOSIS — C7931 Secondary malignant neoplasm of brain: Secondary | ICD-10-CM | POA: Diagnosis not present

## 2024-09-17 DIAGNOSIS — F17210 Nicotine dependence, cigarettes, uncomplicated: Secondary | ICD-10-CM | POA: Diagnosis not present

## 2024-09-17 DIAGNOSIS — Z515 Encounter for palliative care: Secondary | ICD-10-CM

## 2024-09-17 DIAGNOSIS — C7951 Secondary malignant neoplasm of bone: Secondary | ICD-10-CM

## 2024-09-17 LAB — CMP (CANCER CENTER ONLY)
ALT: 34 U/L (ref 0–44)
AST: 23 U/L (ref 15–41)
Albumin: 4.5 g/dL (ref 3.5–5.0)
Alkaline Phosphatase: 103 U/L (ref 38–126)
Anion gap: 10 (ref 5–15)
BUN: 16 mg/dL (ref 8–23)
CO2: 29 mmol/L (ref 22–32)
Calcium: 9.6 mg/dL (ref 8.9–10.3)
Chloride: 99 mmol/L (ref 98–111)
Creatinine: 0.57 mg/dL (ref 0.44–1.00)
GFR, Estimated: >60 mL/min
Glucose, Bld: 109 mg/dL — ABNORMAL HIGH (ref 70–99)
Potassium: 4 mmol/L (ref 3.5–5.1)
Sodium: 138 mmol/L (ref 135–145)
Total Bilirubin: 1 mg/dL (ref 0.0–1.2)
Total Protein: 6.7 g/dL (ref 6.5–8.1)

## 2024-09-17 LAB — CBC WITH DIFFERENTIAL (CANCER CENTER ONLY)
Abs Immature Granulocytes: 0.03 K/uL (ref 0.00–0.07)
Basophils Absolute: 0 K/uL (ref 0.0–0.1)
Basophils Relative: 1 %
Eosinophils Absolute: 0 K/uL (ref 0.0–0.5)
Eosinophils Relative: 1 %
HCT: 39.7 % (ref 36.0–46.0)
Hemoglobin: 14.1 g/dL (ref 12.0–15.0)
Immature Granulocytes: 1 %
Lymphocytes Relative: 14 %
Lymphs Abs: 0.8 K/uL (ref 0.7–4.0)
MCH: 37.2 pg — ABNORMAL HIGH (ref 26.0–34.0)
MCHC: 35.5 g/dL (ref 30.0–36.0)
MCV: 104.7 fL — ABNORMAL HIGH (ref 80.0–100.0)
Monocytes Absolute: 0.6 K/uL (ref 0.1–1.0)
Monocytes Relative: 10 %
Neutro Abs: 4.3 K/uL (ref 1.7–7.7)
Neutrophils Relative %: 73 %
Platelet Count: 156 K/uL (ref 150–400)
RBC: 3.79 MIL/uL — ABNORMAL LOW (ref 3.87–5.11)
RDW: 15.4 % (ref 11.5–15.5)
WBC Count: 5.8 K/uL (ref 4.0–10.5)
nRBC: 0 % (ref 0.0–0.2)

## 2024-09-17 MED ORDER — SODIUM CHLORIDE 0.9 % IV SOLN: Freq: Once | INTRAVENOUS | Status: AC

## 2024-09-17 MED ORDER — ACETAMINOPHEN 325 MG PO TABS
650.0000 mg | ORAL_TABLET | Freq: Once | ORAL | Status: AC
  Administered 2024-09-17: 650 mg via ORAL
  Filled 2024-09-17: qty 2

## 2024-09-17 MED ORDER — TRASTUZUMAB-ANNS CHEMO 150 MG IV SOLR
6.0000 mg/kg | Freq: Once | INTRAVENOUS | Status: AC
  Administered 2024-09-17: 210 mg via INTRAVENOUS
  Filled 2024-09-17: qty 10

## 2024-09-17 NOTE — Progress Notes (Signed)
 "    Palliative Medicine [facility]  [phone] [phone]   Name: Martha [name] Date: 09/17/2024 MRN: [id number]  DOB: Nov 21, 1960  Patient Care Team: [name], MD as PCP - General (Family Medicine) [name], MD as Consulting Physician (General Surgery) [name], MD as Consulting Physician (Hematology) [name], MD as Attending Physician (Radiation Oncology) [name], [name], NP as Nurse Practitioner (Nurse Practitioner)   INTERVAL HISTORY: Martha [name] is a 64 y.o. female with oncologic medical history including ER+ breast cancer (01/2018), right breast cancer (04/2021), now with metastatic disease progression involving brain and liver, pathological fractures with osseous mets s/p brain radiation. .  Palliative ask to see for symptom management and goals of care.   SOCIAL HISTORY:     reports that she has been smoking cigarettes. She has a 20 pack-year smoking history. She has never used smokeless tobacco. She reports that she does not currently use alcohol after a past usage of about 28.0 standard drinks of alcohol per week. She reports that she does not use drugs.  ADVANCE DIRECTIVES:  On file   CODE STATUS: DNR  PAST MEDICAL HISTORY: Past Medical History:  Diagnosis Date   Anxiety    Cancer ([name]) 2022   right breast LCIS   Cancer ([name]) 2019   left breast   Depression    Dysrhythmia    SVT, s/p ablation ~ 2012 in at [facility]   Ectopic pregnancy    Family history of breast cancer    Family history of melanoma    History of radiation therapy 04/22/18-06/03/18   Left Breast, left SCV, axilla 50 Gy in 25 fractions, Left breast boost 10 Gy in 5 fractions.    Hypothyroidism    Personal history of radiation therapy    PONV (postoperative nausea and vomiting)    Thyroid  disease     ALLERGIES:  is allergic to morphine and codeine.  MEDICATIONS:  Current Outpatient Medications  Medication  Sig Dispense Refill   acetaminophen  (TYLENOL ) 500 MG tablet Take 2 tablets (1,000 mg total) by mouth every 8 (eight) hours. 30 tablet 0   capecitabine  (XELODA ) 500 MG tablet TAKE 2 TABLETS TWICE DAILY FOR 7 DAYS ON, THEN 7 DAYS OFF (TAKE AFTER MEALS) 56 tablet 2   citalopram  (CELEXA ) 40 MG tablet Take 1 tablet (40 mg total) by mouth daily. 90 tablet 3   diclofenac  Sodium (VOLTAREN ) 1 % GEL Apply 1 fingertip to palma and soles of feet twice daily with Xeloda  20 g 1   diphenoxylate -atropine  (LOMOTIL ) 2.5-0.025 MG tablet Take 1 tablet by mouth 4 (four) times daily as needed for diarrhea or loose stools. 90 tablet 3   levothyroxine  (SYNTHROID ) 75 MCG tablet Take 1 tablet (75 mcg total) by mouth daily before breakfast. 90 tablet 3   loperamide  (IMODIUM  A-D) 2 MG tablet Take 1 tablet (2 mg total) by mouth 4 (four) times daily as needed for diarrhea or loose stools. 90 tablet 0   Mouthwashes (MOUTH RINSE) LIQD solution 15 mLs by Mouth Rinse route as needed (for oral care).  0   ondansetron  (ZOFRAN ) 8 MG tablet Take 1 tablet (8 mg total) by mouth every 8 (eight) hours as needed for nausea or vomiting. 20 tablet 2   OVER THE COUNTER MEDICATION Take 1 tablet by mouth daily. Protandim Supplement     pregabalin  (LYRICA ) 25 MG capsule Take 1 capsule (25 mg total) by mouth 3 (three) times daily. 90 capsule  3   tucatinib  (TUKYSA ) 150 MG tablet Take 2 tablets (300 mg total) by mouth 2 (two) times daily. Take as instructed per MD. 120 tablet 2   No current facility-administered medications for this visit.    VITAL SIGNS: There were no vitals taken for this visit. There were no vitals filed for this visit.    Estimated body mass index is 13.53 kg/m as calculated from the following:   Height as of an earlier encounter on 09/17/24: 5' 2 (1.575 m).   Weight as of an earlier encounter on 09/17/24: 74 lb (33.6 kg).   PERFORMANCE STATUS (ECOG) : 1 - Symptomatic but completely ambulatory   Physical  Exam General: NAD Cardiovascular: regular rate and rhythm Pulmonary: normal breathing pattern Extremities: no edema, no joint deformities Skin: no rashes Neurological: AAO x3  IMPRESSION: Discussed the use of AI scribe software for clinical note transcription with the patient, who gave verbal consent to proceed.  History of Present Illness Martha [name] is a 64 year old female with metastatic breast cancer who was seen during her infusion. She is doing well. Patient accompanied by her husband. No acute distress noted.   She is considering changing her family practice provider for more convenient monitoring of her thyroid  levels after recent move to different geographical city location.   Martha [name] denies concerns for nausea, vomiting, constipation, or diarrhea. Occasional fatigue however she is able to remain as active as she desires. Appetite is good. Her current weight remains stable at 74lbs.   She recently started dry needling therapy with her first session last Friday. She has four more sessions scheduled and reports a positive experience with the initial session. Her neuropathic pain remains well control on pregabalin . No adjustments to current regimen.   No unmet palliative needs. All questions answered and support provided. We will continue to closely monitor and support in collaboration to her oncology team.   Assessment & Plan Neuropathy Well controlled with Pregabalin . No adjustments at this time. -Continue Pregabalin  25mg  three times daily.   Neck muscle stiffness Recently underwent initial dry needling procedure. Tolerated well and feels good. No adverse effects.  -Patient has 4 additional dry needling sessions scheduled.   I will see patient back in 3-4 weeks. Sooner if needed.   Patient expressed understanding and was in agreement with this plan. She also understands that She can call the clinic at any time with any questions, concerns, or complaints.   Any  controlled substances utilized were prescribed in the context of palliative care. PDMP has been reviewed.   I personally spent a total of 30 minutes in the care of the patient today including preparing to see the patient, getting/reviewing separately obtained history, performing a medically appropriate exam/evaluation, counseling and educating, and documenting clinical information in the EHR. Visit consisted of counseling and education dealing with the complex and emotionally intense issues of symptom management and palliative care in the setting of serious and potentially life-threatening illness.  Levon Borer, AGPCNP-BC  Palliative Medicine Team/[facility]    "

## 2024-09-17 NOTE — Patient Instructions (Signed)

## 2024-09-19 ENCOUNTER — Encounter (HOSPITAL_BASED_OUTPATIENT_CLINIC_OR_DEPARTMENT_OTHER): Payer: Self-pay | Admitting: Physical Therapy

## 2024-09-19 ENCOUNTER — Ambulatory Visit (HOSPITAL_BASED_OUTPATIENT_CLINIC_OR_DEPARTMENT_OTHER): Admitting: Physical Therapy

## 2024-09-19 DIAGNOSIS — M542 Cervicalgia: Secondary | ICD-10-CM

## 2024-09-19 DIAGNOSIS — R293 Abnormal posture: Secondary | ICD-10-CM

## 2024-09-19 DIAGNOSIS — M62838 Other muscle spasm: Secondary | ICD-10-CM

## 2024-09-19 DIAGNOSIS — C50312 Malignant neoplasm of lower-inner quadrant of left female breast: Secondary | ICD-10-CM | POA: Diagnosis not present

## 2024-09-19 NOTE — Therapy (Signed)
 "   OUTPATIENT PHYSICAL THERAPY CERVICAL EVALUATION   Patient Name: Martha [name] MRN: [id number] DOB:1961-01-05, 64 y.o., female Today's Date: 09/19/2024  END OF SESSION:  PT End of Session - 09/19/24 1116     Visit Number 2    Number of Visits 16    Date for Recertification  11/09/24    PT Start Time 0930    PT Stop Time 1017    PT Time Calculation (min) 47 min    Activity Tolerance Patient tolerated treatment well    Behavior During Therapy [facility] for tasks assessed/performed          Past Medical History:  Diagnosis Date   Anxiety    Cancer ([name]) 2022   right breast LCIS   Cancer ([name]) 2019   left breast   Depression    Dysrhythmia    SVT, s/p ablation ~ 2012 in at [facility]   Ectopic pregnancy    Family history of breast cancer    Family history of melanoma    History of radiation therapy 04/22/18-06/03/18   Left Breast, left SCV, axilla 50 Gy in 25 fractions, Left breast boost 10 Gy in 5 fractions.    Hypothyroidism    Personal history of radiation therapy    PONV (postoperative nausea and vomiting)    Thyroid  disease    Past Surgical History:  Procedure Laterality Date   APPENDECTOMY     BILATERAL SALPINGECTOMY     BREAST BIOPSY Right 2014   fibroadenoma   BREAST BIOPSY Left 01/16/2018   BREAST BIOPSY Right 02/02/2020   LCIS   BREAST BIOPSY Right 08/04/2020   x2 LCIS   BREAST BIOPSY Right 08/13/2020   x2   BREAST EXCISIONAL BIOPSY Left 1990   benign   BREAST EXCISIONAL BIOPSY Right 02/02/2020   LCIS   BREAST LUMPECTOMY Left 01/22/2018   BREAST LUMPECTOMY WITH AXILLARY LYMPH NODE BIOPSY Left 02/21/2018   Procedure: LEFT BREAST LUMPECTOMY WITH AXILLARY LYMPH NODE BIOPSY;  Surgeon: [name], MD;  Location: [facility] OR;  Service: General;  Laterality: Left;   BREAST LUMPECTOMY WITH RADIOACTIVE SEED LOCALIZATION Right 03/18/2020   Procedure: RIGHT BREAST LUMPECTOMY WITH RADIOACTIVE SEED LOCALIZATION;  Surgeon: [name], MD;   Location: [facility] OR;  Service: General;  Laterality: Right;   BREAST SURGERY Left 1993   cyst removed   ENDOMETRIAL ABLATION     EYE SURGERY     GLAUCOMA SURGERY Bilateral    IR IMAGING GUIDED PORT INSERTION  10/16/2022   POSTERIOR CERVICAL FUSION/FORAMINOTOMY N/A 08/17/2022   Procedure: Cervical One-Cervical Four POSTERIOR CERVICAL FUSION,  Cervical One LAMINECTOMY REDUCTION OF Cervical Two Fracture, Biopsy of Right Cerical Two Pars  Lesion;  Surgeon: Debby Dorn MATSU, MD;  Location: [facility] OR;  Service: Neurosurgery;  Laterality: N/A;   POSTERIOR CERVICAL FUSION/FORAMINOTOMY N/A 09/06/2022   Procedure: Posterior Cervical Fusion, Foraminotomy , Cervical Five-Six, Cervical Six-Seven; Extension of  fusion Cervical Four- Thoracic One;  Surgeon: Debby Dorn MATSU, MD;  Location: [facility] OR;  Service: Neurosurgery;  Laterality: N/A;   RADIOACTIVE SEED GUIDED EXCISIONAL BREAST BIOPSY Right 04/28/2021   Procedure: RADIOACTIVE SEED GUIDED EXCISIONAL RIGHT BREAST BIOPSY X2;  Surgeon: [name], MD;  Location: [facility];  Service: General;  Laterality: Right;   RE-EXCISION OF BREAST LUMPECTOMY Left 03/20/2018   Procedure: RE-EXCISION OF BREAST LUMPECTOMY;  Surgeon: [name], MD;  Location: [facility];  Service: General;  Laterality: Left;   Patient Active Problem List  Diagnosis Date Noted   Port-A-Cath in place 11/02/2022   Hypokalemia 10/20/2022   Hypomagnesemia 10/20/2022   Protein-calorie malnutrition, severe 10/18/2022   DNR (do not resuscitate) 10/16/2022   High risk medication use 10/16/2022   Goals of care, counseling/discussion 10/16/2022   Palliative care encounter 10/16/2022   Change in mental status 10/12/2022   AMS (altered mental status) 10/12/2022   Hallucination 10/12/2022   Depression 10/12/2022   Cancer related pain 10/12/2022   Acute incomplete quadriplegia ([name]) 09/14/2022   Cervical myelopathy ([name]) 09/13/2022   Elevated alkaline phosphatase  level 09/04/2022   Paresthesia and weakness of both lower extremities 09/04/2022   Secondary cancer of brain ([name]) 09/01/2022   C2 cervical fracture ([name]) 08/17/2022   Metastatic adenocarcinoma ([name]) 08/17/2022   Medication management 08/14/2022   Metastatic breast cancer to bone and brain 08/02/2022   Family history of melanoma 08/02/2022   Family history of breast cancer 08/02/2022   Cervicalgia 06/16/2022   Stress 05/04/2022   Hyperglycemia 04/14/2021   Lobular carcinoma in situ (LCIS) of right breast 02/04/2020   Malignant neoplasm of lower-inner quadrant of left breast in female, estrogen receptor positive ([name]) 01/22/2018   Hypothyroidism 04/04/2017   Nicotine  dependence with current use 04/04/2017   Excessive drinking of alcohol 04/04/2017   Menopausal symptom 04/04/2017    PCP: Worth Kitty MD  REFERRING PROVIDER: Lacie [name] NP  REFERRING DIAG:  Diagnosis  C79.51 (ICD-10-CM) - Cancer, metastatic to bone ([name])  C50.312,Z17.0 (ICD-10-CM) - Malignant neoplasm of lower-inner quadrant of left breast in female, estrogen receptor positive ([name])    THERAPY DIAG:  Cervicalgia  Other muscle spasm  Abnormal posture  Rationale for Evaluation and Treatment: Rehabilitation  ONSET DATE: several years   SUBJECTIVE:                                                                                                                                                                                                         SUBJECTIVE STATEMENT: The patient reports she is about the same. She liked the needling but it didn't have a long term effect.    Eval: Pt is attending PT for dry needling for their neck. Pt has received dry needling tx here before with good outcomes. Pt has a hx of breast CA, stenosis, and OA. An MRI in 2024 revealed that pt has neck CA. Pt has received clearance from Dr to receive dry needling and is not actively receiving chemo tx. Pt gets immunotherapy every 3  weeks and takes a pill at home. Pt had 2 separate surgeries  in 2024 to fuse their cervical spine to prevent paralyzation and nerve compression. No fx in neck currently and MRI scheduled for February.   Pt would like to improve their balance, ROM, and pain.   Hand dominance: Right  PERTINENT HISTORY:  Neuropathy, OA, stenosis, breast cancer, cervical cancer, cervical spinal fusion.   PAIN:  Are you having pain? Yes: NPRS scale: Minimal, can get to a 5 Pain location: cervical spine and shoulder Pain description: ache, spasm, tightness Aggravating factors: Hurts all the time Relieving factors: Pretty sedentary   PRECAUTIONS: Cervical  RED FLAGS: None Despite cancer patient sent to PT for dry needling     WEIGHT BEARING RESTRICTIONS: No  FALLS:  Has patient fallen in last 6 months? No  LIVING ENVIRONMENT: Lives with: lives with their spouse friends and community members who come to visit Lives in: House/apartment Has following equipment at home: None  OCCUPATION: Disabled - doesn't work - also retired  PLOF: Independent with gait, Needs assistance with ADLs, and Needs assistance with homemaking  PATIENT GOALS: Feel better, get stronger, more confident gait, improvements in balance.    OBJECTIVE:  Note: Objective measures were completed at Evaluation unless otherwise noted.  DIAGNOSTIC FINDINGS:  None provided   COGNITION: Overall cognitive status: Within functional limits for tasks assessed  SENSATION: WFL  POSTURE: forward head  PALPATION: TP In shoulders    CERVICAL ROM:   Active ROM A/PROM (deg) eval  Flexion   Extension Limited  Right lateral flexion   Left lateral flexion   Right rotation 20  Left rotation 33   (Blank rows = not tested)  UPPER EXTREMITY ROM:  Active ROM Right eval Left eval  Shoulder flexion ~125 ~100  Shoulder extension    Shoulder abduction    Shoulder adduction    Shoulder extension    Shoulder internal rotation     Shoulder external rotation    Elbow flexion    Elbow extension    Wrist flexion    Wrist extension    Wrist ulnar deviation    Wrist radial deviation    Wrist pronation    Wrist supination     (Blank rows = not tested)  UPPER EXTREMITY MMT:  MMT Right eval Left eval  Shoulder flexion    Shoulder extension    Shoulder abduction    Shoulder adduction    Shoulder extension    Shoulder internal rotation    Shoulder external rotation    Middle trapezius    Lower trapezius    Elbow flexion    Elbow extension    Wrist flexion    Wrist extension    Wrist ulnar deviation    Wrist radial deviation    Wrist pronation    Wrist supination    Grip strength     (Blank rows = not tested)Not tested 2nd to pain   Balance: Decreased narrow base of support Decreased balance with eyes closed Berg balance testing to be performed next visit   TREATMENT DATE:  1/23 rigger Point Dry Needling  Initial Treatment: Pt instructed on Dry Needling rational, procedures, and possible side effects. Pt instructed to expect mild to moderate muscle soreness later in the day and/or into the next day.  Pt instructed in methods to reduce muscle soreness. Pt instructed to continue prescribed HEP. Because Dry Needling was performed over or adjacent to a lung field, pt was educated on S/S of pneumothorax and to seek immediate medical attention should they occur.  Patient was  educated on signs and symptoms of infection and other risk factors and advised to seek medical attention should they occur.  Patient verbalized understanding of these instructions and education.   Patient Verbal Consent Given: Yes Education Handout Provided: Yes Muscles Treated: bilateral upper traps 3x each upper trap with a .30x50 needle  Electrical Stimulation Performed: No Treatment Response/Outcome: Great twitch. Improved shoulder pain and motion                                                                                                                         Manual: Trigger point release to bilateral upper traps Review of self soft tissue mobilization Review of self soft tissue mobilization techniques Passive range of motion of left shoulder into flexion and abduction with distraction Trap release Grade ` and II Posterior and inferior glides     1/16          Trigger Point Dry Needling  Initial Treatment: Pt instructed on Dry Needling rational, procedures, and possible side effects. Pt instructed to expect mild to moderate muscle soreness later in the day and/or into the next day.  Pt instructed in methods to reduce muscle soreness. Pt instructed to continue prescribed HEP. Because Dry Needling was performed over or adjacent to a lung field, pt was educated on S/S of pneumothorax and to seek immediate medical attention should they occur.  Patient was educated on signs and symptoms of infection and other risk factors and advised to seek medical attention should they occur.  Patient verbalized understanding of these instructions and education.   Patient Verbal Consent Given: Yes Education Handout Provided: Yes Muscles Treated: bilateral upper traps 3x each upper trap with a .30x50 needle  Electrical Stimulation Performed: No Treatment Response/Outcome: Great twitch. Improved shoulder pain and motion                                                                                                                        Manual: Trigger point release to bilateral upper traps Review of self soft tissue mobilization Review of self soft tissue mobilization techniques Passive range of motion of left shoulder into flexion and abduction with distraction Trap release  There ex: Review Scap retraction Review of cervical rotation within pain-free ranges   PATIENT EDUCATION:  Education details: Provided education re: balance training and how PT can help with that. Self-STM for post dry needling.  Person  educated: Patient Education method: Explanation Education comprehension: verbalized understanding  HOME  EXERCISE PROGRAM: Insert code  ASSESSMENT:  CLINICAL IMPRESSION: Patient reported improved pain following needling and manual therapy. We performed manual therapy more into shoulder blades today then previous visit. We also worked on low grade shoulder mobilization. We will begin to integrate exercises into plan next visit. We advised her to try to move her shoulders and neck in pain free visits over the next few weeks. She was advised to   Eval: Patient is a 64 y.o. woman who was seen today for physical therapy evaluation and treatment for neck and shoulder tightness.  She is presents with significant limitations in cervical rotation, flexion, extension, as well as shoulder flexion and external rotation.  Her pain and movement limitations are affecting her overall function.  She also has moderate pain in both shoulders.  She has had a positive response to trigger point dry needling and manual therapy in the past.  Patient will benefit from skilled therapy to reduce pain levels and improve ability to perform ADLs.  OBJECTIVE IMPAIRMENTS: decreased activity tolerance, decreased balance, decreased mobility, decreased ROM, increased muscle spasms, postural dysfunction, and pain.   ACTIVITY LIMITATIONS: carrying, lifting, bending, standing, sleeping, dressing, reach over head, and hygiene/grooming  PARTICIPATION LIMITATIONS: cleaning, laundry, driving, and community activity  PERSONAL FACTORS: Age and Time since onset of injury/illness/exacerbation are also affecting patient's functional outcome.   REHAB POTENTIAL: Good  CLINICAL DECISION MAKING: Evolving/moderate complexity  EVALUATION COMPLEXITY: Moderate   GOALS: Goals reviewed with patient? Yes  SHORT TERM GOALS: Target date: 10/10/2024   Pt will have a 75% dec in shoulder tightness Baseline:  Goal status: INITIAL  2.  Pt  will inc UE flexion AROM by 15  Baseline:  Goal status: INITIAL  3.  Pt will inc neck rotation AROM by 10 bilaterally Baseline:  Goal status: INITIAL  LONG TERM GOALS: Target date: 11/07/2024  Pt will have improved balance to help prevent falls per Lars balance score Baseline:  Goal status: INITIAL  2.  Pt will have inc AROM to allow for higher participation in functional ADLs and be able to dress herself Baseline:  Goal status: INITIAL  3.  Pt will have improved gait and endurance in order to inc activity Baseline:  Goal status: INITIAL     PLAN:  PT FREQUENCY: 1-2x/week  PT DURATION: 12 weeks  PLANNED INTERVENTIONS: 97110-Therapeutic exercises, 97530- Therapeutic activity, 97112- Neuromuscular re-education, 97535- Self Care, 02859- Manual therapy, [phone]- Gait training, [phone] (1-2 muscles), [id number] (3+ muscles)- Dry Needling, Patient/Family education, Balance training, Joint mobilization, Spinal mobilization, and Vestibular training  PLAN FOR NEXT SESSION: Consider dry needling, balance training, and scapular/trap exercises (rows, etc.) to increase strength of back muscles and provide neck support.  Berg balance test next visit   Alm JINNY Don, PT 09/19/2024, 4:35 PM  Nyoka Hum SPT   I have reviewed and concur with this student's documentation.   Alm JINNY Don, PT 09/19/2024 4:35 PM   During this treatment session, the therapist was present, participating in and directing the treatment.     "

## 2024-09-26 ENCOUNTER — Ambulatory Visit (HOSPITAL_BASED_OUTPATIENT_CLINIC_OR_DEPARTMENT_OTHER): Admitting: Physical Therapy

## 2024-09-26 ENCOUNTER — Encounter (HOSPITAL_BASED_OUTPATIENT_CLINIC_OR_DEPARTMENT_OTHER): Payer: Self-pay | Admitting: Physical Therapy

## 2024-09-26 DIAGNOSIS — R293 Abnormal posture: Secondary | ICD-10-CM

## 2024-09-26 DIAGNOSIS — M542 Cervicalgia: Secondary | ICD-10-CM

## 2024-09-26 DIAGNOSIS — C50312 Malignant neoplasm of lower-inner quadrant of left female breast: Secondary | ICD-10-CM | POA: Diagnosis not present

## 2024-09-26 DIAGNOSIS — M62838 Other muscle spasm: Secondary | ICD-10-CM

## 2024-09-26 DIAGNOSIS — M25612 Stiffness of left shoulder, not elsewhere classified: Secondary | ICD-10-CM

## 2024-09-26 DIAGNOSIS — M25611 Stiffness of right shoulder, not elsewhere classified: Secondary | ICD-10-CM

## 2024-10-01 ENCOUNTER — Ambulatory Visit (HOSPITAL_COMMUNITY)
Admission: RE | Admit: 2024-10-01 | Discharge: 2024-10-01 | Disposition: A | Source: Ambulatory Visit | Attending: Hematology

## 2024-10-01 DIAGNOSIS — C7951 Secondary malignant neoplasm of bone: Secondary | ICD-10-CM | POA: Insufficient documentation

## 2024-10-01 DIAGNOSIS — C799 Secondary malignant neoplasm of unspecified site: Secondary | ICD-10-CM | POA: Insufficient documentation

## 2024-10-01 DIAGNOSIS — C50312 Malignant neoplasm of lower-inner quadrant of left female breast: Secondary | ICD-10-CM | POA: Insufficient documentation

## 2024-10-01 DIAGNOSIS — Z170 Estrogen receptor positive status [ER+]: Secondary | ICD-10-CM | POA: Insufficient documentation

## 2024-10-01 DIAGNOSIS — Z0189 Encounter for other specified special examinations: Secondary | ICD-10-CM | POA: Diagnosis not present

## 2024-10-01 LAB — ECHOCARDIOGRAM COMPLETE
Area-P 1/2: 3.31 cm2
S' Lateral: 2.3 cm

## 2024-10-03 ENCOUNTER — Encounter (HOSPITAL_BASED_OUTPATIENT_CLINIC_OR_DEPARTMENT_OTHER): Payer: Self-pay | Admitting: Physical Therapy

## 2024-10-03 ENCOUNTER — Ambulatory Visit (HOSPITAL_BASED_OUTPATIENT_CLINIC_OR_DEPARTMENT_OTHER): Attending: Nurse Practitioner | Admitting: Physical Therapy

## 2024-10-03 DIAGNOSIS — R293 Abnormal posture: Secondary | ICD-10-CM

## 2024-10-03 DIAGNOSIS — M25612 Stiffness of left shoulder, not elsewhere classified: Secondary | ICD-10-CM

## 2024-10-03 DIAGNOSIS — M542 Cervicalgia: Secondary | ICD-10-CM

## 2024-10-03 DIAGNOSIS — M25611 Stiffness of right shoulder, not elsewhere classified: Secondary | ICD-10-CM

## 2024-10-03 DIAGNOSIS — M62838 Other muscle spasm: Secondary | ICD-10-CM

## 2024-10-03 NOTE — Therapy (Cosign Needed)
 "   OUTPATIENT PHYSICAL THERAPY CERVICAL Treatment    Patient Name: Martha [name] MRN: [id number] DOB:02-23-1961, 64 y.o., female Today's Date: 10/03/2024  END OF SESSION:  PT End of Session - 10/03/24 0806     Visit Number 4    Number of Visits 16    Date for Recertification  11/09/24    PT Start Time 0803    PT Stop Time 0845    PT Time Calculation (min) 42 min    Activity Tolerance Patient tolerated treatment well    Behavior During Therapy [facility] for tasks assessed/performed           Past Medical History:  Diagnosis Date   Anxiety    Cancer ([name]) 2022   right breast LCIS   Cancer ([name]) 2019   left breast   Depression    Dysrhythmia    SVT, s/p ablation ~ 2012 in at [facility]   Ectopic pregnancy    Family history of breast cancer    Family history of melanoma    History of radiation therapy 04/22/18-06/03/18   Left Breast, left SCV, axilla 50 Gy in 25 fractions, Left breast boost 10 Gy in 5 fractions.    Hypothyroidism    Personal history of radiation therapy    PONV (postoperative nausea and vomiting)    Thyroid  disease    Past Surgical History:  Procedure Laterality Date   APPENDECTOMY     BILATERAL SALPINGECTOMY     BREAST BIOPSY Right 2014   fibroadenoma   BREAST BIOPSY Left 01/16/2018   BREAST BIOPSY Right 02/02/2020   LCIS   BREAST BIOPSY Right 08/04/2020   x2 LCIS   BREAST BIOPSY Right 08/13/2020   x2   BREAST EXCISIONAL BIOPSY Left 1990   benign   BREAST EXCISIONAL BIOPSY Right 02/02/2020   LCIS   BREAST LUMPECTOMY Left 01/22/2018   BREAST LUMPECTOMY WITH AXILLARY LYMPH NODE BIOPSY Left 02/21/2018   Procedure: LEFT BREAST LUMPECTOMY WITH AXILLARY LYMPH NODE BIOPSY;  Surgeon: [name], MD;  Location: [facility] OR;  Service: General;  Laterality: Left;   BREAST LUMPECTOMY WITH RADIOACTIVE SEED LOCALIZATION Right 03/18/2020   Procedure: RIGHT BREAST LUMPECTOMY WITH RADIOACTIVE SEED LOCALIZATION;  Surgeon: [name], MD;   Location: [facility] OR;  Service: General;  Laterality: Right;   BREAST SURGERY Left 1993   cyst removed   ENDOMETRIAL ABLATION     EYE SURGERY     GLAUCOMA SURGERY Bilateral    IR IMAGING GUIDED PORT INSERTION  10/16/2022   POSTERIOR CERVICAL FUSION/FORAMINOTOMY N/A 08/17/2022   Procedure: Cervical One-Cervical Four POSTERIOR CERVICAL FUSION,  Cervical One LAMINECTOMY REDUCTION OF Cervical Two Fracture, Biopsy of Right Cerical Two Pars  Lesion;  Surgeon: Debby Dorn MATSU, MD;  Location: [facility] OR;  Service: Neurosurgery;  Laterality: N/A;   POSTERIOR CERVICAL FUSION/FORAMINOTOMY N/A 09/06/2022   Procedure: Posterior Cervical Fusion, Foraminotomy , Cervical Five-Six, Cervical Six-Seven; Extension of  fusion Cervical Four- Thoracic One;  Surgeon: Debby Dorn MATSU, MD;  Location: [facility] OR;  Service: Neurosurgery;  Laterality: N/A;   RADIOACTIVE SEED GUIDED EXCISIONAL BREAST BIOPSY Right 04/28/2021   Procedure: RADIOACTIVE SEED GUIDED EXCISIONAL RIGHT BREAST BIOPSY X2;  Surgeon: [name], MD;  Location: [facility];  Service: General;  Laterality: Right;   RE-EXCISION OF BREAST LUMPECTOMY Left 03/20/2018   Procedure: RE-EXCISION OF BREAST LUMPECTOMY;  Surgeon: [name], MD;  Location: [facility];  Service: General;  Laterality: Left;   Patient Active Problem List  Diagnosis Date Noted   Port-A-Cath in place 11/02/2022   Hypokalemia 10/20/2022   Hypomagnesemia 10/20/2022   Protein-calorie malnutrition, severe 10/18/2022   DNR (do not resuscitate) 10/16/2022   High risk medication use 10/16/2022   Goals of care, counseling/discussion 10/16/2022   Palliative care encounter 10/16/2022   Change in mental status 10/12/2022   AMS (altered mental status) 10/12/2022   Hallucination 10/12/2022   Depression 10/12/2022   Cancer related pain 10/12/2022   Acute incomplete quadriplegia ([name]) 09/14/2022   Cervical myelopathy ([name]) 09/13/2022   Elevated alkaline phosphatase  level 09/04/2022   Paresthesia and weakness of both lower extremities 09/04/2022   Secondary cancer of brain ([name]) 09/01/2022   C2 cervical fracture ([name]) 08/17/2022   Metastatic adenocarcinoma ([name]) 08/17/2022   Medication management 08/14/2022   Metastatic breast cancer to bone and brain 08/02/2022   Family history of melanoma 08/02/2022   Family history of breast cancer 08/02/2022   Cervicalgia 06/16/2022   Stress 05/04/2022   Hyperglycemia 04/14/2021   Lobular carcinoma in situ (LCIS) of right breast 02/04/2020   Malignant neoplasm of lower-inner quadrant of left breast in female, estrogen receptor positive ([name]) 01/22/2018   Hypothyroidism 04/04/2017   Nicotine  dependence with current use 04/04/2017   Excessive drinking of alcohol 04/04/2017   Menopausal symptom 04/04/2017    PCP: Worth Kitty MD  REFERRING PROVIDER: Lacie [name] NP  REFERRING DIAG:  Diagnosis  C79.51 (ICD-10-CM) - Cancer, metastatic to bone ([name])  C50.312,Z17.0 (ICD-10-CM) - Malignant neoplasm of lower-inner quadrant of left breast in female, estrogen receptor positive ([name])    THERAPY DIAG:  Cervicalgia  Other muscle spasm  Abnormal posture  Stiffness of left shoulder, not elsewhere classified  Stiffness of right shoulder, not elsewhere classified  Rationale for Evaluation and Treatment: Rehabilitation  ONSET DATE: several years   SUBJECTIVE:                                                                                                                                                                                                         SUBJECTIVE STATEMENT: The patient reports she is still having pain but it is better. She feels like her shoulders are looser.   Eval: Pt is attending PT for dry needling for their neck. Pt has received dry needling tx here before with good outcomes. Pt has a hx of breast CA, stenosis, and OA. An MRI in 2024 revealed that pt has neck CA. Pt has received  clearance from Dr to receive dry needling and is not actively receiving chemo tx. Pt gets immunotherapy  every 3 weeks and takes a pill at home. Pt had 2 separate surgeries in 2024 to fuse their cervical spine to prevent paralyzation and nerve compression. No fx in neck currently and MRI scheduled for February.   Pt would like to improve their balance, ROM, and pain.   Hand dominance: Right  PERTINENT HISTORY:  Neuropathy, OA, stenosis, breast cancer, cervical cancer, cervical spinal fusion.   PAIN:  Are you having pain? Yes: NPRS scale: Minimal, can get to a 5 Pain location: cervical spine and shoulder Pain description: ache, spasm, tightness Aggravating factors: Hurts all the time Relieving factors: Pretty sedentary   PRECAUTIONS: Cervical  RED FLAGS: None Despite cancer patient sent to PT for dry needling     WEIGHT BEARING RESTRICTIONS: No  FALLS:  Has patient fallen in last 6 months? No  LIVING ENVIRONMENT: Lives with: lives with their spouse friends and community members who come to visit Lives in: House/apartment Has following equipment at home: None  OCCUPATION: Disabled - doesn't work - also retired  PLOF: Independent with gait, Needs assistance with ADLs, and Needs assistance with homemaking  PATIENT GOALS: Feel better, get stronger, more confident gait, improvements in balance.    OBJECTIVE:  Note: Objective measures were completed at Evaluation unless otherwise noted.  DIAGNOSTIC FINDINGS:  None provided   COGNITION: Overall cognitive status: Within functional limits for tasks assessed  SENSATION: WFL  POSTURE: forward head  PALPATION: TP In shoulders    CERVICAL ROM:   Active ROM A/PROM (deg) eval AROM  1/30   Flexion    Extension Limited   Right lateral flexion    Left lateral flexion    Right rotation 20 29  Left rotation 33 35   (Blank rows = not tested)  UPPER EXTREMITY ROM:  Active ROM Right eval Left eval Right  1/30  Left 1/30   Shoulder flexion ~125 ~100 143 104  Shoulder extension      Shoulder abduction      Shoulder adduction      Shoulder extension      Shoulder internal rotation      Shoulder external rotation      Elbow flexion      Elbow extension      Wrist flexion      Wrist extension      Wrist ulnar deviation      Wrist radial deviation      Wrist pronation      Wrist supination       (Blank rows = not tested)  UPPER EXTREMITY MMT:  MMT Right eval Left eval  Shoulder flexion    Shoulder extension    Shoulder abduction    Shoulder adduction    Shoulder extension    Shoulder internal rotation    Shoulder external rotation    Middle trapezius    Lower trapezius    Elbow flexion    Elbow extension    Wrist flexion    Wrist extension    Wrist ulnar deviation    Wrist radial deviation    Wrist pronation    Wrist supination    Grip strength     (Blank rows = not tested)Not tested 2nd to pain   Balance: Decreased narrow base of support Decreased balance with eyes closed Berg balance testing to be performed next visit   TREATMENT DATE:  2/6 Trigger Point Dry Needling  Initial Treatment: Pt instructed on Dry Needling rational, procedures, and possible side effects. Pt instructed to expect  mild to moderate muscle soreness later in the day and/or into the next day.  Pt instructed in methods to reduce muscle soreness. Pt instructed to continue prescribed HEP. Because Dry Needling was performed over or adjacent to a lung field, pt was educated on S/S of pneumothorax and to seek immediate medical attention should they occur.  Patient was educated on signs and symptoms of infection and other risk factors and advised to seek medical attention should they occur.  Patient verbalized understanding of these instructions and education.   Patient Verbal Consent Given: Yes Education Handout Provided: Yes Muscles Treated: bilateral upper traps 3x each upper trap with a .30x50  needle  Electrical Stimulation Performed: No Treatment Response/Outcome: Great twitch. Improved shoulder pain and motion                                                                                                                        Manual: Trigger point release to bilateral upper traps Review of self soft tissue mobilization Review of self soft tissue mobilization techniques Passive range of motion of left shoulder into flexion and abduction with distraction Trap release Grade ` and II Posterior and inferior glides  Thoracic glides in side lying   There-ex:  Wand flexion 3x10  Ball roll x10 fwd  X10 lateral each way 5 sec hold with all.   1/30  Trigger Point Dry Needling  Initial Treatment: Pt instructed on Dry Needling rational, procedures, and possible side effects. Pt instructed to expect mild to moderate muscle soreness later in the day and/or into the next day.  Pt instructed in methods to reduce muscle soreness. Pt instructed to continue prescribed HEP. Because Dry Needling was performed over or adjacent to a lung field, pt was educated on S/S of pneumothorax and to seek immediate medical attention should they occur.  Patient was educated on signs and symptoms of infection and other risk factors and advised to seek medical attention should they occur.  Patient verbalized understanding of these instructions and education.   Patient Verbal Consent Given: Yes Education Handout Provided: Yes Muscles Treated: bilateral upper traps 3x each upper trap with a .30x50 needle  Electrical Stimulation Performed: No Treatment Response/Outcome: Great twitch. Improved shoulder pain and motion                                                                                                                        Manual: Trigger point release to bilateral upper traps Review  of self soft tissue mobilization Review of self soft tissue mobilization techniques Passive range of motion of  left shoulder into flexion and abduction with distraction Trap release Grade ` and II Posterior and inferior glides    Neuro re-ed:  Rows 3x10 YTB Extension 3x10 YTB  Self-care  Provided ed to spouse on how to help with STM and exercises at home Provided feedback to patient on exercise and using a mirror for visual feedback Reviewed soft tissue mobilization concepts to the patient  1/23 rigger Point Dry Needling  Initial Treatment: Pt instructed on Dry Needling rational, procedures, and possible side effects. Pt instructed to expect mild to moderate muscle soreness later in the day and/or into the next day.  Pt instructed in methods to reduce muscle soreness. Pt instructed to continue prescribed HEP. Because Dry Needling was performed over or adjacent to a lung field, pt was educated on S/S of pneumothorax and to seek immediate medical attention should they occur.  Patient was educated on signs and symptoms of infection and other risk factors and advised to seek medical attention should they occur.  Patient verbalized understanding of these instructions and education.   Patient Verbal Consent Given: Yes Education Handout Provided: Yes Muscles Treated: bilateral upper traps 3x each upper trap with a .30x50 needle  Electrical Stimulation Performed: No Treatment Response/Outcome: Great twitch. Improved shoulder pain and motion                                                                                                                        Manual: Trigger point release to bilateral upper traps Review of self soft tissue mobilization Review of self soft tissue mobilization techniques Passive range of motion of left shoulder into flexion and abduction with distraction Trap release Grade ` and II Posterior and inferior glides     1/16          Trigger Point Dry Needling  Initial Treatment: Pt instructed on Dry Needling rational, procedures, and possible side effects. Pt  instructed to expect mild to moderate muscle soreness later in the day and/or into the next day.  Pt instructed in methods to reduce muscle soreness. Pt instructed to continue prescribed HEP. Because Dry Needling was performed over or adjacent to a lung field, pt was educated on S/S of pneumothorax and to seek immediate medical attention should they occur.  Patient was educated on signs and symptoms of infection and other risk factors and advised to seek medical attention should they occur.  Patient verbalized understanding of these instructions and education.   Patient Verbal Consent Given: Yes Education Handout Provided: Yes Muscles Treated: bilateral upper traps 3x each upper trap with a .30x50 needle  Electrical Stimulation Performed: No Treatment Response/Outcome: Great twitch. Improved shoulder pain and motion  Manual: Trigger point release to bilateral upper traps Review of self soft tissue mobilization Review of self soft tissue mobilization techniques Passive range of motion of left shoulder into flexion and abduction with distraction Trap release  There ex: Review Scap retraction Review of cervical rotation within pain-free ranges   PATIENT EDUCATION:  Education details: Provided education re: balance training and how PT can help with that. Self-STM for post dry needling.  Person educated: Patient Education method: Explanation Education comprehension: verbalized understanding  HOME EXERCISE PROGRAM: Insert code  ASSESSMENT:  CLINICAL IMPRESSION: Therapy continues to work on soft tissue mobilization and dry needling. She fees like her range is improving. She was able to perform an overhead flexion stretch with a cane. We will continue to progress as tolerated. We will continue to review posterior chain strengthening.   Eval: Patient is a 64 y.o. woman  who was seen today for physical therapy evaluation and treatment for neck and shoulder tightness.  She is presents with significant limitations in cervical rotation, flexion, extension, as well as shoulder flexion and external rotation.  Her pain and movement limitations are affecting her overall function.  She also has moderate pain in both shoulders.  She has had a positive response to trigger point dry needling and manual therapy in the past.  Patient will benefit from skilled therapy to reduce pain levels and improve ability to perform ADLs.  OBJECTIVE IMPAIRMENTS: decreased activity tolerance, decreased balance, decreased mobility, decreased ROM, increased muscle spasms, postural dysfunction, and pain.   ACTIVITY LIMITATIONS: carrying, lifting, bending, standing, sleeping, dressing, reach over head, and hygiene/grooming  PARTICIPATION LIMITATIONS: cleaning, laundry, driving, and community activity  PERSONAL FACTORS: Age and Time since onset of injury/illness/exacerbation are also affecting patient's functional outcome.   REHAB POTENTIAL: Good  CLINICAL DECISION MAKING: Evolving/moderate complexity  EVALUATION COMPLEXITY: Moderate   GOALS: Goals reviewed with patient? Yes  SHORT TERM GOALS: Target date: 10/10/2024   Pt will have a 75% dec in shoulder tightness Baseline:  Goal status: improving 2/6   2.  Pt will inc UE flexion AROM by 15  Baseline:  Goal status: INITIAL  3.  Pt will inc neck rotation AROM by 10 bilaterally Baseline:  Goal status: INITIAL  LONG TERM GOALS: Target date: 11/07/2024  Pt will have improved balance to help prevent falls per Lars balance score Baseline:  Goal status: INITIAL  2.  Pt will have inc AROM to allow for higher participation in functional ADLs and be able to dress herself Baseline:  Goal status: INITIAL  3.  Pt will have improved gait and endurance in order to inc activity Baseline:  Goal status: INITIAL     PLAN:  PT  FREQUENCY: 1-2x/week  PT DURATION: 12 weeks  PLANNED INTERVENTIONS: 97110-Therapeutic exercises, 97530- Therapeutic activity, 97112- Neuromuscular re-education, 97535- Self Care, [id number]- Manual therapy, [phone]- Gait training, [phone] (1-2 muscles), [id number] (3+ muscles)- Dry Needling, Patient/Family education, Balance training, Joint mobilization, Spinal mobilization, and Vestibular training  PLAN FOR NEXT SESSION: Consider dry needling, balance training, and scapular/trap exercises (rows, etc.) to increase strength of back muscles and provide neck support.  Berg balance test next visit   Nyoka Pearson, Student-PT 10/03/2024, 8:07 AM  Brenlynn Fake SPT   I have reviewed and concur with this student's documentation.   Etna Forquer O'Shea, Student-PT 10/03/2024 8:07 AM   During this treatment session, the therapist was present, participating in and directing the treatment.     "

## 2024-10-08 ENCOUNTER — Inpatient Hospital Stay

## 2024-10-08 ENCOUNTER — Inpatient Hospital Stay: Attending: Genetic Counselor

## 2024-10-08 ENCOUNTER — Inpatient Hospital Stay: Admitting: Nurse Practitioner

## 2024-10-09 ENCOUNTER — Other Ambulatory Visit

## 2024-10-13 ENCOUNTER — Inpatient Hospital Stay

## 2024-10-13 ENCOUNTER — Inpatient Hospital Stay: Admitting: Internal Medicine

## 2024-11-13 ENCOUNTER — Encounter: Admitting: Family Medicine

## 2025-03-26 ENCOUNTER — Encounter: Admitting: Nurse Practitioner
